# Patient Record
Sex: Male | Born: 1970 | Race: White | Hispanic: No | Marital: Married | Smoking: Never smoker
Health system: Southern US, Community
[De-identification: ages and names within clinical notes are randomized; demographics above are authoritative.]

## PROBLEM LIST (undated history)

## (undated) DIAGNOSIS — C719 Malignant neoplasm of brain, unspecified: Secondary | ICD-10-CM

## (undated) DIAGNOSIS — J984 Other disorders of lung: Secondary | ICD-10-CM

## (undated) DIAGNOSIS — IMO0002 Reserved for concepts with insufficient information to code with codable children: Secondary | ICD-10-CM

## (undated) DIAGNOSIS — K760 Fatty (change of) liver, not elsewhere classified: Secondary | ICD-10-CM

## (undated) DIAGNOSIS — E663 Overweight: Secondary | ICD-10-CM

## (undated) DIAGNOSIS — T7840XA Allergy, unspecified, initial encounter: Secondary | ICD-10-CM

## (undated) DIAGNOSIS — I499 Cardiac arrhythmia, unspecified: Secondary | ICD-10-CM

## (undated) DIAGNOSIS — B019 Varicella without complication: Secondary | ICD-10-CM

## (undated) DIAGNOSIS — K219 Gastro-esophageal reflux disease without esophagitis: Secondary | ICD-10-CM

## (undated) DIAGNOSIS — R413 Other amnesia: Secondary | ICD-10-CM

## (undated) DIAGNOSIS — R200 Anesthesia of skin: Secondary | ICD-10-CM

## (undated) DIAGNOSIS — Z0000 Encounter for general adult medical examination without abnormal findings: Secondary | ICD-10-CM

## (undated) DIAGNOSIS — G43909 Migraine, unspecified, not intractable, without status migrainosus: Secondary | ICD-10-CM

## (undated) DIAGNOSIS — Z87898 Personal history of other specified conditions: Secondary | ICD-10-CM

## (undated) DIAGNOSIS — IMO0001 Reserved for inherently not codable concepts without codable children: Secondary | ICD-10-CM

## (undated) DIAGNOSIS — C20 Malignant neoplasm of rectum: Principal | ICD-10-CM

## (undated) DIAGNOSIS — D649 Anemia, unspecified: Secondary | ICD-10-CM

## (undated) DIAGNOSIS — C801 Malignant (primary) neoplasm, unspecified: Secondary | ICD-10-CM

## (undated) DIAGNOSIS — R918 Other nonspecific abnormal finding of lung field: Principal | ICD-10-CM

## (undated) DIAGNOSIS — R945 Abnormal results of liver function studies: Secondary | ICD-10-CM

## (undated) DIAGNOSIS — R52 Pain, unspecified: Secondary | ICD-10-CM

## (undated) DIAGNOSIS — K469 Unspecified abdominal hernia without obstruction or gangrene: Secondary | ICD-10-CM

## (undated) DIAGNOSIS — R569 Unspecified convulsions: Secondary | ICD-10-CM

## (undated) DIAGNOSIS — L578 Other skin changes due to chronic exposure to nonionizing radiation: Secondary | ICD-10-CM

## (undated) DIAGNOSIS — C3490 Malignant neoplasm of unspecified part of unspecified bronchus or lung: Secondary | ICD-10-CM

## (undated) DIAGNOSIS — R911 Solitary pulmonary nodule: Secondary | ICD-10-CM

## (undated) DIAGNOSIS — Z8669 Personal history of other diseases of the nervous system and sense organs: Secondary | ICD-10-CM

## (undated) HISTORY — DX: Other nonspecific abnormal finding of lung field: R91.8

## (undated) HISTORY — DX: Migraine, unspecified, not intractable, without status migrainosus: G43.909

## (undated) HISTORY — DX: Reserved for inherently not codable concepts without codable children: IMO0001

## (undated) HISTORY — PX: TONSILLECTOMY: SUR1361

## (undated) HISTORY — DX: Abnormal results of liver function studies: R94.5

## (undated) HISTORY — DX: Other skin changes due to chronic exposure to nonionizing radiation: L57.8

## (undated) HISTORY — DX: Allergy, unspecified, initial encounter: T78.40XA

## (undated) HISTORY — DX: Gastro-esophageal reflux disease without esophagitis: K21.9

## (undated) HISTORY — DX: Varicella without complication: B01.9

## (undated) HISTORY — DX: Reserved for concepts with insufficient information to code with codable children: IMO0002

## (undated) HISTORY — DX: Other disorders of lung: J98.4

## (undated) HISTORY — DX: Anemia, unspecified: D64.9

## (undated) HISTORY — PX: HERNIA REPAIR: SHX51

## (undated) HISTORY — PX: CYST EXCISION: SHX5701

## (undated) HISTORY — DX: Overweight: E66.3

## (undated) HISTORY — DX: Encounter for general adult medical examination without abnormal findings: Z00.00

## (undated) HISTORY — DX: Unspecified abdominal hernia without obstruction or gangrene: K46.9

---

## 2012-01-19 ENCOUNTER — Encounter: Payer: Self-pay | Admitting: Family Medicine

## 2012-01-19 ENCOUNTER — Ambulatory Visit (INDEPENDENT_AMBULATORY_CARE_PROVIDER_SITE_OTHER): Payer: BC Managed Care – PPO | Admitting: Family Medicine

## 2012-01-19 VITALS — BP 138/90 | HR 63 | Temp 98.6°F | Ht 70.0 in | Wt 187.8 lb

## 2012-01-19 DIAGNOSIS — R140 Abdominal distension (gaseous): Secondary | ICD-10-CM

## 2012-01-19 DIAGNOSIS — E663 Overweight: Secondary | ICD-10-CM

## 2012-01-19 DIAGNOSIS — K219 Gastro-esophageal reflux disease without esophagitis: Secondary | ICD-10-CM

## 2012-01-19 DIAGNOSIS — R197 Diarrhea, unspecified: Secondary | ICD-10-CM | POA: Insufficient documentation

## 2012-01-19 DIAGNOSIS — K625 Hemorrhage of anus and rectum: Secondary | ICD-10-CM | POA: Insufficient documentation

## 2012-01-19 DIAGNOSIS — R7989 Other specified abnormal findings of blood chemistry: Secondary | ICD-10-CM

## 2012-01-19 DIAGNOSIS — Z23 Encounter for immunization: Secondary | ICD-10-CM

## 2012-01-19 DIAGNOSIS — Z0000 Encounter for general adult medical examination without abnormal findings: Secondary | ICD-10-CM | POA: Insufficient documentation

## 2012-01-19 DIAGNOSIS — L578 Other skin changes due to chronic exposure to nonionizing radiation: Secondary | ICD-10-CM

## 2012-01-19 DIAGNOSIS — R030 Elevated blood-pressure reading, without diagnosis of hypertension: Secondary | ICD-10-CM

## 2012-01-19 DIAGNOSIS — R142 Eructation: Secondary | ICD-10-CM

## 2012-01-19 DIAGNOSIS — IMO0001 Reserved for inherently not codable concepts without codable children: Secondary | ICD-10-CM

## 2012-01-19 DIAGNOSIS — T7840XA Allergy, unspecified, initial encounter: Secondary | ICD-10-CM

## 2012-01-19 HISTORY — DX: Other specified abnormal findings of blood chemistry: R79.89

## 2012-01-19 HISTORY — DX: Encounter for general adult medical examination without abnormal findings: Z00.00

## 2012-01-19 HISTORY — DX: Allergy, unspecified, initial encounter: T78.40XA

## 2012-01-19 HISTORY — DX: Overweight: E66.3

## 2012-01-19 HISTORY — DX: Other skin changes due to chronic exposure to nonionizing radiation: L57.8

## 2012-01-19 HISTORY — DX: Reserved for inherently not codable concepts without codable children: IMO0001

## 2012-01-19 LAB — CBC
Hemoglobin: 15.1 g/dL (ref 13.0–17.0)
MCHC: 33 g/dL (ref 30.0–36.0)
Platelets: 217 10*3/uL (ref 150.0–400.0)
RBC: 5.11 Mil/uL (ref 4.22–5.81)
RDW: 12.3 % (ref 11.5–14.6)

## 2012-01-19 LAB — RENAL FUNCTION PANEL
BUN: 10 mg/dL (ref 6–23)
CO2: 28 mEq/L (ref 19–32)
Chloride: 105 mEq/L (ref 96–112)
GFR: 96.34 mL/min (ref >60.00)
Phosphorus: 2.6 mg/dL (ref 2.3–4.6)
Potassium: 3.5 mEq/L (ref 3.5–5.1)
Sodium: 140 mEq/L (ref 135–145)

## 2012-01-19 LAB — TSH: TSH: 1.47 u[IU]/mL (ref 0.35–5.50)

## 2012-01-19 LAB — HEPATIC FUNCTION PANEL
AST: 61 U/L — ABNORMAL HIGH (ref 0–37)
Albumin: 4.2 g/dL (ref 3.5–5.2)
Alkaline Phosphatase: 56 U/L (ref 39–117)
Total Bilirubin: 0.6 mg/dL (ref 0.3–1.2)

## 2012-01-19 LAB — H. PYLORI ANTIBODY, IGG: H Pylori IgG: NEGATIVE

## 2012-01-19 MED ORDER — TETANUS-DIPHTH-ACELL PERTUSSIS 5-2.5-18.5 LF-MCG/0.5 IM SUSP: 0.5000 mL | Freq: Once | INTRAMUSCULAR | Status: DC

## 2012-01-19 NOTE — Assessment & Plan Note (Signed)
Experiencing anywhere from 2 to 8 loose stool daily at this time. Does travel to [place] intermittently secondary to his wife being from there. Will test him for Giardia, O & P, stool culture, stool for WBC, H Pylori, will have him start a probiotic and a fiber supplement a couple times a day and is referred to gastroenterology for further evaluation.

## 2012-01-19 NOTE — Assessment & Plan Note (Signed)
Patient was very pale skin and diffuse freckles throughout. Has a raised irregular lesion on his right arm he reports was biopsied in the past and he was told he had some precancer cells there but he has not pursued it. He also has a hypopigmented lesion on his left shoulder which is new and birth marks on bilateral feet along the dorsal surfaces which are not changed but are somewhat unusual. We will refer him to dermatology for diffuse skin exam and further evaluation

## 2012-01-19 NOTE — Assessment & Plan Note (Signed)
He agrees to flu and tdap, will give upon return. Labs from insurance company reviewed including cholesterol panel which was normal. Encouraged heart healthy diet, return in 1 month or sooner as needed

## 2012-01-19 NOTE — Progress Notes (Signed)
Patient ID: Jacob [name], male   DOB: [birth year], 41 y.o.   MRN: [id number] JARROD MCENERY [id number] [birth year] 01/19/2012      Progress Note New Patient  Subjective  Chief Complaint  Chief Complaint  Patient presents with  . Establish Care    new patient    HPI  Patient is a 41 year old Caucasian male who is in today with multiple concerns. 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 He felt a great deal of sludge frequently. Does struggle intermittently with a sense of postnasal drip and a mild cough but this is not his major complaint today. She's had trouble off and on since used with GI disturbances. He reports having episodes of constipation and diarrhea off and on throughout his whole childhood. Diarrhea predominant. Had frequent dyspepsia and he reports roughly 20-35 his symptoms quite a bit better with some dietary changes. Over the last 5-6 years his symptoms have progressively worsened. He notes she's been having episodes of rectal bleeding off and on for several years now. Initially it appeared to be just on the surface of the stool now he feels it is mixed in the stool and if that is happening infrequently is happening with each bowel movement. In the past he diet with some hemorrhoids and had irritation now not always the case. He reports on a good day she'll have 2 loose stool and on a bad day 8. He frequently feels bloated and gaseous the last year. He describes abdominal discomfort. Denies any black or sticky stool. Recently did lab work for a life insurance and was found to have elevated liver functions but a negative hepatitis C panel he'll he notes with all this at length he has some allergic conditions with nasal congestion but really he had more trouble with this younger man as well. He has diffuse freckles and skin changes but has a few lesions one on his right arm he says was previously biopsied and he was told was precancer. It is  regrowing at this time. He has a hypopigmented lesion on his left shoulder and 2 birth marks one on the top of each foot. He reports allergy to penicillin but does not recall the reaction. Says it was a rash he thinks. Believes she's taken amoxicillin since then without difficulty. No headache, fevers, chills, chest pain, palpitations or shortness of breath or noted.  Past Medical History  Diagnosis Date  . Chicken pox as a child    ?  . Hernia 6 months old  . Allergic state 01/19/2012  . Reflux 01/19/2012  . Elevated liver function tests 01/19/2012  . Overweight 01/19/2012  . Rectal bleeding 01/19/2012  . Diarrhea 01/19/2012  . Preventative health care 01/19/2012  . Elevated BP 01/19/2012  . Sun-damaged skin 01/19/2012    Past Surgical History  Procedure Date  . Hernia repair 6 months old    right inguinal repair    Family History  Problem Relation Age of Onset  . Adopted: Yes  . Cancer Father   . Heart disease Father     History   Social History  . Marital Status: Married    Spouse Name: N/A    Number of Children: N/A  . Years of Education: N/A   Occupational History  . Not on file.   Social History Main Topics  . Smoking status: Never Smoker   . Smokeless tobacco: Never Used  . Alcohol Use: Yes  occasionally  . Drug Use: Yes     marijuana  . Sexually Active: Yes -- Male partner(s)   Other Topics Concern  . Not on file   Social History Narrative  . No narrative on file    No current outpatient prescriptions on file prior to visit.   No current facility-administered medications on file prior to visit.    Allergies  Allergen Reactions  . Penicillins     Review of Systems  Review of Systems  Constitutional: Positive for malaise/fatigue. Negative for fever and chills.  HENT: Positive for congestion. Negative for hearing loss and nosebleeds.   Eyes: Negative for discharge.  Respiratory: Negative for cough, sputum production, shortness of  breath and wheezing.   Cardiovascular: Negative for chest pain, palpitations and leg swelling.  Gastrointestinal: Positive for heartburn, abdominal pain, diarrhea and blood in stool. Negative for nausea, vomiting, constipation and melena.  Genitourinary: Negative for dysuria, urgency, frequency and hematuria.  Musculoskeletal: Negative for myalgias, back pain and falls.  Skin: Negative for rash.  Neurological: Negative for dizziness, tremors, sensory change, focal weakness, loss of consciousness, weakness and headaches.  Endo/Heme/Allergies: Negative for polydipsia. Does not bruise/bleed easily.  Psychiatric/Behavioral: Negative for depression and suicidal ideas. The patient is not nervous/anxious and does not have insomnia.     Objective  BP 138/90  Pulse 63  Temp 98.6 F (37 C) (Temporal)  Ht 5\' 10"  (1.778 m)  Wt 187 lb 12.8 oz (85.186 kg)  BMI 26.95 kg/m2  SpO2 97%  Physical Exam  Physical Exam  Constitutional: He is oriented to person, place, and time and well-developed, well-nourished, and in no distress. No distress.  HENT:  Head: Normocephalic and atraumatic.       Small growth of cartilage just anterior to left ear  Eyes: Conjunctivae normal are normal.  Neck: Neck supple. No thyromegaly present.  Cardiovascular: Normal rate, regular rhythm and normal heart sounds.   No murmur heard. Pulmonary/Chest: Effort normal and breath sounds normal. No respiratory distress.  Abdominal: He exhibits no distension and no mass. There is no tenderness.  Musculoskeletal: He exhibits no edema.  Neurological: He is alert and oriented to person, place, and time.  Skin: Skin is warm.       Diffuse freckles, brown oval, slightly raised lesion on right forearm. Hypopigmented lesion on left shoulder 1 cm in diameter and a lesion on the top of both feet R>L circular, raised brown, 2 tone  Psychiatric: Memory, affect and judgment normal.       Assessment & Plan  Allergic state Has had  more trouble in past, will consider otc antihistamines in future if symptoms worsen  Diarrhea Experiencing anywhere from 2 to 8 loose stool daily at this time. Does travel to [place] intermittently secondary to his wife being from there. Will test him for Giardia, O & P, stool culture, stool for WBC, H Pylori, will have him start a probiotic and a fiber supplement a couple times a day and is referred to gastroenterology for further evaluation.   Reflux Was experiencing worse dyspepsia a few months ago, will not initiate a medication today but is encouraged to minimize spicy and fatty foods  Rectal bleeding Patient has been noting intermittent episodes of blood on the stool for a couple of years now. More recently over the last couple months it's become more consistent. He is having blood on his stool with every movement and is noticing blood mixed in the stool in the bowl now as well.  Further complaint of increased gaseousness and bloating for about a year now. Is referred to gastroenterology today for further consideration and is subjected to stool and blood testing today.   Elevated liver function tests Had lab work done in September of 2013 when he was applying to get life insurance, his AST was 51 and his ALT was 110. Hep C checked at that time was negative. Will perform a full hepatitis panel today, repeat liver function testing and check a ferritin level and cbc. Encouraged him to consider the possibility of fatty liver disease and he is asked to minimize simple carbs and hi fat foods. Check a complete abdominal ultrasound  Overweight Consider DASH diet. Given handout. Check TSH  Elevated BP Minimize sodium and check labs, recheck at next visit.  Sun-damaged skin Patient was very pale skin and diffuse freckles throughout. Has a raised irregular lesion on his right arm he reports was biopsied in the past and he was told he had some precancer cells there but he has not pursued it. He also has  a hypopigmented lesion on his left shoulder which is new and birth marks on bilateral feet along the dorsal surfaces which are not changed but are somewhat unusual. We will refer him to dermatology for diffuse skin exam and further evaluation  Preventative health care He agrees to flu and tdap, will give upon return. Labs from insurance company reviewed including cholesterol panel which was normal. Encouraged heart healthy diet, return in 1 month or sooner as needed

## 2012-01-19 NOTE — Assessment & Plan Note (Signed)
Minimize sodium and check labs, recheck at next visit.

## 2012-01-19 NOTE — Assessment & Plan Note (Signed)
Patient has been noting intermittent episodes of blood on the stool for a couple of years now. More recently over the last couple months it's become more consistent. He is having blood on his stool with every movement and is noticing blood mixed in the stool in the bowl now as well. Further complaint of increased gaseousness and bloating for about a year now. Is referred to gastroenterology today for further consideration and is subjected to stool and blood testing today.

## 2012-01-19 NOTE — Assessment & Plan Note (Signed)
Consider DASH diet. Given handout. Check TSH

## 2012-01-19 NOTE — Assessment & Plan Note (Signed)
Has had more trouble in past, will consider otc antihistamines in future if symptoms worsen

## 2012-01-19 NOTE — Patient Instructions (Signed)
Digestive Health probiotic by Schiff Start a fiber powder of your choice twice daily   Preventive Care for Adults, Male A healthy lifestyle and preventive care can promote health and wellness. Preventive health guidelines for men include the following key practices:  A routine yearly physical is a good way to check with your caregiver about your health and preventative screening. It is a chance to share any concerns and updates on your health, and to receive a thorough exam.  Visit your dentist for a routine exam and preventative care every 6 months. Brush your teeth twice a day and floss once a day. Good oral hygiene prevents tooth decay and gum disease.  The frequency of eye exams is based on your age, health, family medical history, use of contact lenses, and other factors. Follow your caregiver's recommendations for frequency of eye exams.  Eat a healthy diet. Foods like vegetables, fruits, whole grains, low-fat dairy products, and lean protein foods contain the nutrients you need without too many calories. Decrease your intake of foods high in solid fats, added sugars, and salt. Eat the right amount of calories for you.Get information about a proper diet from your caregiver, if necessary.  Regular physical exercise is one of the most important things you can do for your health. Most adults should get at least 150 minutes of moderate-intensity exercise (any activity that increases your heart rate and causes you to sweat) each week. In addition, most adults need muscle-strengthening exercises on 2 or more days a week.  Maintain a healthy weight. The body mass index (BMI) is a screening tool to identify possible weight problems. It provides an estimate of body fat based on height and weight. Your caregiver can help determine your BMI, and can help you achieve or maintain a healthy weight.For adults 20 years and older:  A BMI below 18.5 is considered underweight.  A BMI of 18.5 to 24.9 is  normal.  A BMI of 25 to 29.9 is considered overweight.  A BMI of 30 and above is considered obese.  Maintain normal blood lipids and cholesterol levels by exercising and minimizing your intake of saturated fat. Eat a balanced diet with plenty of fruit and vegetables. Blood tests for lipids and cholesterol should begin at age 74 and be repeated every 5 years. If your lipid or cholesterol levels are high, you are over 50, or you are a high risk for heart disease, you may need your cholesterol levels checked more frequently.Ongoing high lipid and cholesterol levels should be treated with medicines if diet and exercise are not effective.  If you smoke, find out from your caregiver how to quit. If you do not use tobacco, do not start.  If you choose to drink alcohol, do not exceed 2 drinks per day. One drink is considered to be 12 ounces (355 mL) of beer, 5 ounces (148 mL) of wine, or 1.5 ounces (44 mL) of liquor.  Avoid use of street drugs. Do not share needles with anyone. Ask for help if you need support or instructions about stopping the use of drugs.  High blood pressure causes heart disease and increases the risk of stroke. Your blood pressure should be checked at least every 1 to 2 years. Ongoing high blood pressure should be treated with medicines, if weight loss and exercise are not effective.  If you are 92 to 41 years old, ask your caregiver if you should take aspirin to prevent heart disease.  Diabetes screening involves taking a blood  sample to check your fasting blood sugar level. This should be done once every 3 years, after age 41, if you are within normal weight and without risk factors for diabetes. Testing should be considered at a younger age or be carried out more frequently if you are overweight and have at least 1 risk factor for diabetes.  Colorectal cancer can be detected and often prevented. Most routine colorectal cancer screening begins at the age of 95 and continues  through age 31. However, your caregiver may recommend screening at an earlier age if you have risk factors for colon cancer. On a yearly basis, your caregiver may provide home test kits to check for hidden blood in the stool. Use of a small camera at the end of a tube, to directly examine the colon (sigmoidoscopy or colonoscopy), can detect the earliest forms of colorectal cancer. Talk to your caregiver about this at age 72, when routine screening begins. Direct examination of the colon should be repeated every 5 to 10 years through age 42, unless early forms of pre-cancerous polyps or small growths are found.  Hepatitis C blood testing is recommended for all people born from [birth year] through 1965 and any individual with known risks for hepatitis C.  Practice safe sex. Use condoms and avoid high-risk sexual practices to reduce the spread of sexually transmitted infections (STIs). STIs include gonorrhea, chlamydia, syphilis, trichomonas, herpes, HPV, and human immunodeficiency virus (HIV). Herpes, HIV, and HPV are viral illnesses that have no cure. They can result in disability, cancer, and death.  A one-time screening for abdominal aortic aneurysm (AAA) and surgical repair of large AAAs by sound wave imaging (ultrasonography) is recommended for ages 24 to 36 years who are current or former smokers.  Healthy men should no longer receive prostate-specific antigen (PSA) blood tests as part of routine cancer screening. Consult with your caregiver about prostate cancer screening.  Testicular cancer screening is not recommended for adult males who have no symptoms. Screening includes self-exam, caregiver exam, and other screening tests. Consult with your caregiver about any symptoms you have or any concerns you have about testicular cancer.  Use sunscreen with skin protection factor (SPF) of 30 or more. Apply sunscreen liberally and repeatedly throughout the day. You should seek shade when your shadow is shorter  than you. Protect yourself by wearing long sleeves, pants, a wide-brimmed hat, and sunglasses year round, whenever you are outdoors.  Once a month, do a whole body skin exam, using a mirror to look at the skin on your back. Notify your caregiver of new moles, moles that have irregular borders, moles that are larger than a pencil eraser, or moles that have changed in shape or color.  Stay current with required immunizations.  Influenza. You need a dose every fall (or winter). The composition of the flu vaccine changes each year, so being vaccinated once is not enough.  Pneumococcal polysaccharide. You need 1 to 2 doses if you smoke cigarettes or if you have certain chronic medical conditions. You need 1 dose at age 35 (or older) if you have never been vaccinated.  Tetanus, diphtheria, pertussis (Tdap, Td). Get 1 dose of Tdap vaccine if you are younger than age 47 years, are over 81 and have contact with an infant, are a Research scientist (physical sciences), or simply want to be protected from whooping cough. After that, you need a Td booster dose every 10 years. Consult your caregiver if you have not had at least 3 tetanus and diphtheria-containing shots sometime  in your life or have a deep or dirty wound.  HPV. This vaccine is recommended for males 13 through 41 years of age. This vaccine may be given to men 22 through 41 years of age who have not completed the 3 dose series. It is recommended for men through age 17 who have sex with men or whose immune system is weakened because of HIV infection, other illness, or medications. The vaccine is given in 3 doses over 6 months.  Measles, mumps, rubella (MMR). You need at least 1 dose of MMR if you were born in 1957 or later. You may also need a 2nd dose.  Meningococcal. If you are age 62 to 41 years and a Orthoptist living in a residence hall, or have one of several medical conditions, you need to get vaccinated against meningococcal disease. You may also  need additional booster doses.  Zoster (shingles). If you are age 63 years or older, you should get this vaccine.  Varicella (chickenpox). If you have never had chickenpox or you were vaccinated but received only 1 dose, talk to your caregiver to find out if you need this vaccine.  Hepatitis A. You need this vaccine if you have a specific risk factor for hepatitis A virus infection, or you simply wish to be protected from this disease. The vaccine is usually given as 2 doses, 6 to 18 months apart.  Hepatitis B. You need this vaccine if you have a specific risk factor for hepatitis B virus infection or you simply wish to be protected from this disease. The vaccine is given in 3 doses, usually over 6 months. Preventative Service / Frequency Ages 49 to 75  Blood pressure check.** / Every 1 to 2 years.  Lipid and cholesterol check.** / Every 5 years beginning at age 6.  Hepatitis C blood test.** / For any individual with known risks for hepatitis C.  Skin self-exam. / Monthly.  Influenza immunization.** / Every year.  Pneumococcal polysaccharide immunization.** / 1 to 2 doses if you smoke cigarettes or if you have certain chronic medical conditions.  Tetanus, diphtheria, pertussis (Tdap,Td) immunization. / A one-time dose of Tdap vaccine. After that, you need a Td booster dose every 10 years.  HPV immunization. / 3 doses over 6 months, if 26 and younger.  Measles, mumps, rubella (MMR) immunization. / You need at least 1 dose of MMR if you were born in 1957 or later. You may also need a 2nd dose.  Meningococcal immunization. / 1 dose if you are age 24 to 69 years and a Orthoptist living in a residence hall, or have one of several medical conditions, you need to get vaccinated against meningococcal disease. You may also need additional booster doses.  Varicella immunization.** / Consult your caregiver.  Hepatitis A immunization.** / Consult your caregiver. 2 doses, 6 to  18 months apart.  Hepatitis B immunization.** / Consult your caregiver. 3 doses usually over 6 months. Ages 74 to 76  Blood pressure check.** / Every 1 to 2 years.  Lipid and cholesterol check.** / Every 5 years beginning at age 80.  Fecal occult blood test (FOBT) of stool. / Every year beginning at age 22 and continuing until age 55. You may not have to do this test if you get colonoscopy every 10 years.  Flexible sigmoidoscopy** or colonoscopy.** / Every 5 years for a flexible sigmoidoscopy or every 10 years for a colonoscopy beginning at age 56 and continuing until age 44.  Hepatitis C blood test.** / For all people born from 38 through 1965 and any individual with known risks for hepatitis C.  Skin self-exam. / Monthly.  Influenza immunization.** / Every year.  Pneumococcal polysaccharide immunization.** / 1 to 2 doses if you smoke cigarettes or if you have certain chronic medical conditions.  Tetanus, diphtheria, pertussis (Tdap/Td) immunization.** / A one-time dose of Tdap vaccine. After that, you need a Td booster dose every 10 years.  Measles, mumps, rubella (MMR) immunization. / You need at least 1 dose of MMR if you were born in 1957 or later. You may also need a 2nd dose.  Varicella immunization.**/ Consult your caregiver.  Meningococcal immunization.** / Consult your caregiver.  Hepatitis A immunization.** / Consult your caregiver. 2 doses, 6 to 18 months apart.  Hepatitis B immunization.** / Consult your caregiver. 3 doses, usually over 6 months. Ages 38 and over  Blood pressure check.** / Every 1 to 2 years.  Lipid and cholesterol check.**/ Every 5 years beginning at age 2.  Fecal occult blood test (FOBT) of stool. / Every year beginning at age 33 and continuing until age 75. You may not have to do this test if you get colonoscopy every 10 years.  Flexible sigmoidoscopy** or colonoscopy.** / Every 5 years for a flexible sigmoidoscopy or every 10 years for a  colonoscopy beginning at age 14 and continuing until age 32.  Hepatitis C blood test.** / For all people born from [birth year] through 1965 and any individual with known risks for hepatitis C.  Abdominal aortic aneurysm (AAA) screening.** / A one-time screening for ages 39 to 15 years who are current or former smokers.  Skin self-exam. / Monthly.  Influenza immunization.** / Every year.  Pneumococcal polysaccharide immunization.** / 1 dose at age 58 (or older) if you have never been vaccinated.  Tetanus, diphtheria, pertussis (Tdap, Td) immunization. / A one-time dose of Tdap vaccine if you are over 65 and have contact with an infant, are a Research scientist (physical sciences), or simply want to be protected from whooping cough. After that, you need a Td booster dose every 10 years.  Varicella immunization. ** / Consult your caregiver.  Meningococcal immunization.** / Consult your caregiver.  Hepatitis A immunization. ** / Consult your caregiver. 2 doses, 6 to 18 months apart.  Hepatitis B immunization.** / Check with your caregiver. 3 doses, usually over 6 months. **Family history and personal history of risk and conditions may change your caregiver's recommendations. Document Released: 05/04/2001 Document Revised: 05/31/2011 Document Reviewed: 08/03/2010 [facility] Patient Information 2013 [place], [place].

## 2012-01-19 NOTE — Assessment & Plan Note (Signed)
Was experiencing worse dyspepsia a few months ago, will not initiate a medication today but is encouraged to minimize spicy and fatty foods

## 2012-01-19 NOTE — Assessment & Plan Note (Signed)
Had lab work done in September of 2013 when he was applying to get life insurance, his AST was 51 and his ALT was 110. Hep C checked at that time was negative. Will perform a full hepatitis panel today, repeat liver function testing and check a ferritin level and cbc. Encouraged him to consider the possibility of fatty liver disease and he is asked to minimize simple carbs and hi fat foods. Check a complete abdominal ultrasound

## 2012-01-20 ENCOUNTER — Encounter: Payer: Self-pay | Admitting: Gastroenterology

## 2012-01-20 ENCOUNTER — Ambulatory Visit (INDEPENDENT_AMBULATORY_CARE_PROVIDER_SITE_OTHER): Payer: BC Managed Care – PPO

## 2012-01-20 DIAGNOSIS — Z23 Encounter for immunization: Secondary | ICD-10-CM

## 2012-01-20 LAB — HEPATITIS PANEL, ACUTE
HCV Ab: NEGATIVE
Hep A IgM: NEGATIVE
Hep B C IgM: NEGATIVE

## 2012-01-20 MED ORDER — TETANUS-DIPHTH-ACELL PERTUSSIS 5-2.5-18.5 LF-MCG/0.5 IM SUSP: 0.5000 mL | Freq: Once | INTRAMUSCULAR | Status: DC

## 2012-01-20 NOTE — Addendum Note (Signed)
Addended by: Baldemar Lenis R on: 01/20/2012 10:58 AM   Modules accepted: Orders

## 2012-01-21 ENCOUNTER — Ambulatory Visit (HOSPITAL_BASED_OUTPATIENT_CLINIC_OR_DEPARTMENT_OTHER)
Admission: RE | Admit: 2012-01-21 | Discharge: 2012-01-21 | Disposition: A | Discharge status: Home / Self Care | Payer: BC Managed Care – PPO | Source: Ambulatory Visit | Attending: Family Medicine | Admitting: Family Medicine

## 2012-01-21 DIAGNOSIS — K921 Melena: Secondary | ICD-10-CM | POA: Insufficient documentation

## 2012-01-21 DIAGNOSIS — R141 Gas pain: Secondary | ICD-10-CM | POA: Insufficient documentation

## 2012-01-21 DIAGNOSIS — R7989 Other specified abnormal findings of blood chemistry: Secondary | ICD-10-CM | POA: Insufficient documentation

## 2012-01-21 DIAGNOSIS — R142 Eructation: Secondary | ICD-10-CM | POA: Insufficient documentation

## 2012-01-21 LAB — FECAL LACTOFERRIN, QUANT: Lactoferrin: POSITIVE

## 2012-01-21 LAB — OVA AND PARASITE EXAMINATION

## 2012-01-21 NOTE — Progress Notes (Signed)
Quick Note:  Patient Informed and voiced understanding ______ 

## 2012-01-21 NOTE — Progress Notes (Signed)
Quick Note:  Patient Informed and voiced understanding.  I mailed a copy to patient. Pt will call back if he wants to proceed with referrals. ______

## 2012-01-24 LAB — STOOL CULTURE

## 2012-01-25 ENCOUNTER — Encounter: Payer: Self-pay | Admitting: Gastroenterology

## 2012-01-25 ENCOUNTER — Ambulatory Visit (INDEPENDENT_AMBULATORY_CARE_PROVIDER_SITE_OTHER): Payer: BC Managed Care – PPO | Admitting: Gastroenterology

## 2012-01-25 ENCOUNTER — Other Ambulatory Visit (INDEPENDENT_AMBULATORY_CARE_PROVIDER_SITE_OTHER): Payer: BC Managed Care – PPO

## 2012-01-25 VITALS — BP 128/72 | HR 68 | Wt 191.4 lb

## 2012-01-25 DIAGNOSIS — K921 Melena: Secondary | ICD-10-CM

## 2012-01-25 DIAGNOSIS — R7989 Other specified abnormal findings of blood chemistry: Secondary | ICD-10-CM

## 2012-01-25 DIAGNOSIS — R197 Diarrhea, unspecified: Secondary | ICD-10-CM

## 2012-01-25 LAB — COMPREHENSIVE METABOLIC PANEL
ALT: 114 U/L — ABNORMAL HIGH (ref 0–53)
AST: 52 U/L — ABNORMAL HIGH (ref 0–37)
Alkaline Phosphatase: 53 U/L (ref 39–117)
BUN: 19 mg/dL (ref 6–23)
Creatinine, Ser: 0.9 mg/dL (ref 0.4–1.5)
Total Bilirubin: 0.4 mg/dL (ref 0.3–1.2)

## 2012-01-25 LAB — CBC WITH DIFFERENTIAL/PLATELET
Basophils Absolute: 0 10*3/uL (ref 0.0–0.1)
Basophils Relative: 0.3 % (ref 0.0–3.0)
Eosinophils Absolute: 0.1 10*3/uL (ref 0.0–0.7)
Hemoglobin: 15 g/dL (ref 13.0–17.0)
Lymphocytes Relative: 24.9 % (ref 12.0–46.0)
Lymphs Abs: 1.3 10*3/uL (ref 0.7–4.0)
MCHC: 33.2 g/dL (ref 30.0–36.0)
MCV: 89.8 fl (ref 78.0–100.0)
Monocytes Absolute: 0.6 10*3/uL (ref 0.1–1.0)
Neutro Abs: 3.1 10*3/uL (ref 1.4–7.7)
RDW: 12.6 % (ref 11.5–14.6)

## 2012-01-25 LAB — IBC PANEL: Transferrin: 191 mg/dL — ABNORMAL LOW (ref 212.0–360.0)

## 2012-01-25 LAB — ALPHA-1-ANTITRYPSIN: A-1 Antitrypsin, Ser: 136 mg/dL (ref 90–200)

## 2012-01-25 LAB — CERULOPLASMIN: Ceruloplasmin: 24 mg/dL (ref 20–60)

## 2012-01-25 LAB — PROTIME-INR: INR: 1 ratio (ref 0.8–1.0)

## 2012-01-25 MED ORDER — PEG-KCL-NACL-NASULF-NA ASC-C 100 G PO SOLR: 1.0000 | Freq: Once | ORAL | Status: DC

## 2012-01-25 NOTE — Progress Notes (Signed)
HPI: This is a    very pleasant 41 year old man whom I am meeting for the first time today.  Alt 124, ast 61.  Other lfts all normal.  Fecal lactoferrin was positive however all other testing including Clostridium difficile, ova parasites, routine culture were normal. CBC was normal. Complete metabolic profile was normal except for the liver tests mentioned previously.  [place] recent showed gb sludge, fatty liver othwerwise normal  Alternating bowels lately.  Feels like a "blockage,"  Feels very bloated at times. Will usually 6-10 times a day at times.  Can be only small tablespoon of stool. Has noticed blood in his stool. This occurred 2-3 years ago as well.  Blamed hemorrhoids at that point.  Lately, blood within the stool for several months.  Has gained 15 pounds at least in the past year.  But feels like he has been eating less.    Liver tests elevated recently, has never been told this previously.    Adopted.    Pretty rare alcohol.  Professor at [employer], Psychologist, educational, scultping.         Review of systems: Pertinent positive and negative review of systems were noted in the above HPI section. Complete review of systems was performed and was otherwise normal.    Past Medical History  Diagnosis Date  . Chicken pox as a child    ?  . Hernia 6 months old  . Allergic state 01/19/2012  . Reflux 01/19/2012  . Elevated liver function tests 01/19/2012  . Overweight 01/19/2012  . Rectal bleeding 01/19/2012  . Diarrhea 01/19/2012  . Preventative health care 01/19/2012  . Elevated BP 01/19/2012  . Sun-damaged skin 01/19/2012  . Migraine headache as a child    Past Surgical History  Procedure Date  . Hernia repair 6 months old    right inguinal repair    No current outpatient prescriptions on file.    Allergies as of 01/25/2012 - Review Complete 01/25/2012  Allergen Reaction Noted  . Penicillins  01/19/2012    Family History  Problem Relation Age of Onset  . Adopted: Yes  .  Cancer Father   . Heart disease Father     History   Social History  . Marital Status: Married    Spouse Name: N/A    Number of Children: 1  . Years of Education: N/A   Occupational History  . Scultor/Professor    Social History Main Topics  . Smoking status: Never Smoker   . Smokeless tobacco: Never Used  . Alcohol Use: Yes     Comment: occasionally  . Drug Use: Yes    Special: Marijuana     Comment: marijuana  . Sexually Active: Yes -- Male partner(s)   Other Topics Concern  . Not on file   Social History Narrative  . No narrative on file       Physical Exam: BP 128/72  Pulse 68  Wt 191 lb 6.4 oz (86.818 kg) Constitutional: generally well-appearing Psychiatric: alert and oriented x3 Eyes: extraocular movements intact Mouth: oral pharynx moist, no lesions Neck: supple no lymphadenopathy Cardiovascular: heart regular rate and rhythm Lungs: clear to auscultation bilaterally Abdomen: soft, nontender, nondistended, no obvious ascites, no peritoneal signs, normal bowel sounds Extremities: no lower extremity edema bilaterally Skin: no lesions on visible extremities    Assessment and plan: 41 y.o. male with  elevated liver tests, diarrhea, intermittent rectal bleeding.  He is art he had viral testing for his elevated liver tests. I suspect these  are related to fatty liver disease, he has gained 15-20 pounds in the past year. He also has some loose stools and blood in his stool, possibly colitis, inflammatory bowel disease. This can also cause LFT elevation, systemic irritation. I'm going to send his blood for further testing to try to explain his liver tests elevation, see those tests summarized below. He also get colonoscopy for his diarrhea and rectal bleeding.

## 2012-01-25 NOTE — Patient Instructions (Addendum)
You will be set up for a colonoscopy for diarrhea, blood in stool. You will get labs drawn today:  total iron, ferritin, TIBC, ANA, AMA, anti smooth muscle antibody, alpha 1 antitrypsin, cerulloplasm, TTG, total IgA level, CBC, CMET, INR. This is for elevated liver tests. A copy of this information will be made available to Dr. Abner Greenspan.

## 2012-01-26 LAB — MITOCHONDRIAL ANTIBODIES: Mitochondrial M2 Ab, IgG: 0.34 (ref <0.91)

## 2012-01-26 LAB — TISSUE TRANSGLUTAMINASE, IGA: Tissue Transglutaminase Ab, IgA: 4 U/mL (ref <20)

## 2012-01-26 LAB — ANTI-SMOOTH MUSCLE ANTIBODY, IGG: Smooth Muscle Ab: 3 U (ref <20)

## 2012-02-01 ENCOUNTER — Other Ambulatory Visit: Payer: Self-pay

## 2012-02-08 ENCOUNTER — Telehealth: Payer: Self-pay | Admitting: Gastroenterology

## 2012-02-08 NOTE — Telephone Encounter (Signed)
Pt has been notified to have labs the Friday before the procedure

## 2012-02-14 ENCOUNTER — Encounter: Payer: Self-pay | Admitting: Family Medicine

## 2012-02-14 ENCOUNTER — Ambulatory Visit (INDEPENDENT_AMBULATORY_CARE_PROVIDER_SITE_OTHER): Payer: BC Managed Care – PPO | Admitting: Family Medicine

## 2012-02-14 VITALS — BP 122/81 | HR 78 | Temp 98.4°F | Ht 70.0 in | Wt 187.4 lb

## 2012-02-14 DIAGNOSIS — R197 Diarrhea, unspecified: Secondary | ICD-10-CM

## 2012-02-14 DIAGNOSIS — R7989 Other specified abnormal findings of blood chemistry: Secondary | ICD-10-CM

## 2012-02-14 DIAGNOSIS — K219 Gastro-esophageal reflux disease without esophagitis: Secondary | ICD-10-CM

## 2012-02-14 DIAGNOSIS — R030 Elevated blood-pressure reading, without diagnosis of hypertension: Secondary | ICD-10-CM

## 2012-02-14 NOTE — Assessment & Plan Note (Signed)
Only one episode since last visit. May use Tums or Maalox prn for infrequent episodes

## 2012-02-14 NOTE — Assessment & Plan Note (Signed)
Numbers good today, no changes, minimize sodium

## 2012-02-14 NOTE — Assessment & Plan Note (Signed)
Has dropped from 8-10 episodes daily to 5-6 with the addition of fiber and probiotics, agrees to proceed with colonoscopy and to consider avoiding gluten for 3 months to see if his symptoms improve. Call if symptoms worsen

## 2012-02-14 NOTE — Assessment & Plan Note (Signed)
Repeat LFTs today. Hepatitis panel negative. Minimize saturated fats and simple carbs

## 2012-02-14 NOTE — Patient Instructions (Addendum)
Gluten-Free Diet  Gluten is a protein found in many grains. Gluten is present in wheat, rye, and barley. Gluten from wheat, rye, and barley protein interferes with the absorption of food in people with gluten sensitivity. It may also cause intestinal injury when eaten by individuals with gluten sensitivity.   A sample piece (biopsy) of the small intestine is usually required for a positive diagnosis of gluten sensitivity. Dietary treatment consists of eliminating foods and food ingredients from wheat, rye, and barley. When these are taken out of the diet completely, most people regain function of the small intestine.  Strict compliance is important even during symptom-free periods. People with gluten sensitivity need to be on a gluten-free diet for a lifetime. During the first stages of treatment, some people will also need to restrict dairy products that contain lactose, which is a naturally occurring sugar. Lactose is difficult to absorb when the small intestines are damaged (lactose intolerance).   WHO NEEDS THIS DIET  Some people who have certain diseases need to be on a gluten-free diet. These diseases include:  · Celiac disease.  · Nontropical sprue.  · Gluten-sensitive enteropathy.  · Dermatitis herpetiformis.  SPECIAL NOTES  · Read all labels because gluten may have been added as an incidental ingredient. Words to check for on the label include: flour, starch, durum flour, graham flour, phosphated flour, self-rising flour, semolina, farina, modified food starch, cereal, thickening, fillers, emulsifiers, any kind of malt flavoring, and hydrolyzed vegetable protein. A registered dietician can help you identify possible harmful ingredients in the foods you normally eat.  · If you are not sure whether an ingredient contains gluten, check with the manufacturer. Note that some manufacturers may change ingredients without notice. Always read labels.   · Since flour and cereal products are often used in the  preparation of foods, it is important to be aware of the methods of preparation used, as well as the foods themselves. This is especially true when you are dining out.  Starches  · Allowed: Only those prepared from arrowroot, corn, potato, rice, and bean flours. Rice wafers(*), pure cornmeal tortillas, popcorn, some crackers, and chips(*). Hot cereals made from cornmeal. Ask your dietician which specific hot and cold cereals are allowed. White or sweet potatoes, yams, hominy, rice or wild rice, and special gluten-free pasta. Some oriental rice noodles or bean noodles.  · Avoid: All wheat and rye cereals, wheat germ, barley, bran, graham, malt, bulgur, and millet(-). NOTE: Avoid cereals containing malt as a flavoring, such as rice cereal. Regular noodles, spaghetti, macaroni, and most packaged rice mixes(*). All others containing wheat, rye, or barley.  Vegetables  · Allowed: All plain, fresh, frozen, or canned vegetables.  · Avoid: Creamed vegetables(*) and vegetables canned in sauces(*). Any prepared with wheat, rye, or barley.  Fruit  · Allowed: All fresh, frozen, canned, or dried fruits. Fruit juices.  · Avoid: Thickened or prepared fruits and some pie fillings(*).  Meat and Meat Substitutes  · Allowed: Meat, fish, poultry, or eggs prepared without added wheat, rye, or barley. Luncheon meat(*), frankfurters(*), and pure meat. All aged cheese and processed cheese products(*). Cottage cheese(+) and cream cheese(+). Dried beans, dried peas, and lentils.  · Avoid: Any meat or meat alternate containing wheat, rye, barley, or gluten stabilizers. Bread-containing products, such as Swiss steak, croquettes, and meatloaf. Tuna canned in vegetable broth(*); turkey with HVP injected as part of the basting; any cheese product containing oat gum as an ingredient.  Milk  · Allowed: Milk.   Yogurt made with allowed ingredients(*).  · Avoid: Commercial chocolate milk which may have cereal added(*). Malted milk.  Soups and  Combination Foods  · Allowed: Homemade broth and soups made with allowed ingredients; some canned or frozen soups are allowed(*). Combination or prepared foods that do not contain gluten(*). Read labels.  · Avoid: All soups containing wheat, rye, or barley flour. Bouillon and bouillon cubes that contain hydrolyzed vegetable protein (HVP). Combination or prepared foods that contain gluten(*).  Desserts  · Allowed:  Custard, some pudding mixes(*), homemade puddings from cornstarch, rice, and tapioca. Gelatin desserts, ices, and sherbet(*). Cake, cookies, and other desserts prepared with allowed flours. Some commercial ice creams(*). Ask your dietician about specific brands of dessert that are allowed.  · Avoid: Cakes, cookies, doughnuts, and pastries that are prepared with wheat, rye, or barley flour. Some commercial ice creams(*), ice cream flavors which contain cookies, crumbs, or cheesecake(*). Ice cream cones. All commercially prepared mixes for cakes, cookies, and other desserts(*). Bread pudding and other puddings thickened with flour.  Sweets  · Allowed: Sugar, honey, syrup(*), molasses, jelly, jam, plain hard candy, marshmallows, gumdrops, homemade candies free from wheat, rye, or barley. Coconut.  · Avoid: Commercial candies containing wheat, rye, or barley(*). Certain buttercrunch toffees are dusted with wheat flour. Ask your dietician about specific brands that are not allowed. Chocolate-coated nuts, which are often rolled in flour.  Fats and Oils  · Allowed: Butter, margarine, vegetable oil, sour cream(+), whipping cream, shortening, lard, cream, mayonnaise(*). Some commercial salad dressings(*). Peanut butter.  · Avoid: Some commercial salad dressings(*).  Beverages  · Allowed: Coffee (regular or decaffeinated), tea, herbal tea (read label to be sure that no wheat flour has been added). Carbonated beverages and some root beers(*). Wine, sake, and distilled spirits, such as gin, vodka, and  whiskey.  · Avoid:  Certain cereal beverages. Ask your dietician about specific brands that are not allowed. Beer (unless gluten-free), ale, malted milk, and some root beers, wine, and sake.  Condiments/ Miscellaneous  · Allowed: Salt, pepper, herbs, spices, extracts, and food colorings. Monosodium glutamate (MSG). Cider, rice, and wine vinegar. Baking soda and baking powder. Certain soy sauces. Ask your dietician about specific brands that are allowed. Nuts, coconut, chocolate, and pure cocoa powder.  · Avoid: Some curry powder(*), some dry seasoning mixes(*), some gravy extracts(*), some meat sauces(*), some catsup(*), some prepared mustard(*), horseradish(*), some soy sauce(*), chip dips(*), and some chewing gum(*). Yeast extract (contains barley). Caramel color (may contain malt). Ask your dietician about specific brands of condiments to avoid.  Flour and Thickening Agents  · Allowed: Arrowroot starch (A); Corn bran (B); Corn flour (B,C,D); Corn germ (B); Cornmeal (B,C,D); Corn starch (A); Potato flour (B,C,E); Potato starch flour (B,C,E); Rice bran (B); Rice flours: Plain, brown (B,C,D,E), and Sweet (A,B,C,F). Rice polish (B,C,G); Soy flour (B,C,G); Tapioca starch (A).  The flour and thickening agents described above are good for:  (A) Good thickening agent  (B) Good when combined with other flours  (C) Best combined with milk and eggs in baked products  (D) Best in grainy-textured products  (E) Produces drier product than other flours  (F) Produces moister product than other flours  (G) Adds distinct flavor to product. Use in moderation.  (*) Check labels and investigate any questionable ingredients.   (-) Additional research is needed before this product can be recommended.  (+) Check vegetable gum used.  SAMPLE MEAL PLAN  Breakfast   · Orange juice.  · Banana.  ·   Gluten-free bread.  Custard.  Heart-healthy margarine.  Coffee or tea. These meal plans are provided as samples. Your daily meal plans will vary. Document Released: 03/08/2005 Document Revised: 09/07/2011 Document Reviewed: 04/18/2011 [facility] Patient Information 2013 [place], [place].   Fatty Liver Fatty liver is the accumulation of fat in liver cells. It is also called hepatosteatosis or steatohepatitis. It is normal for your liver to contain some fat. If fat is more than 5 to 10% of your liver's weight, you have fatty liver.  There are often no symptoms (problems) for years while damage is still occurring. People often learn about their fatty liver when they have medical tests for other reasons. Fat can damage your liver for years or even decades without causing problems. When it becomes severe, it can cause fatigue, weight loss, weakness, and confusion. This makes you more likely to develop more serious liver problems. The liver is the largest organ in the body. It does a lot of work and often gives no warning signs when it is sick until late in a disease. The liver has many important jobs including:  Breaking down foods.  Storing vitamins, iron, and other minerals.  Making proteins.  Making bile for food digestion.  Breaking down many products including medications, alcohol and some poisons. CAUSES  There are a number of different conditions, medications, and poisons that can cause a fatty liver. Eating too many calories causes fat to build up in the liver. Not processing and breaking fats down normally may also cause this. Certain conditions, such as obesity, diabetes, and high triglycerides also cause this. Most fatty liver patients tend to be  middle-aged and over weight.  Some causes of fatty liver are:  Alcohol over consumption.  Malnutrition.  Steroid use.  Valproic acid toxicity.  Obesity.  Cushing's syndrome.  Poisons.  Tetracycline in high dosages.  Pregnancy.  Diabetes.  Hyperlipidemia.  Rapid weight loss. Some people develop fatty liver even having none of these conditions. SYMPTOMS  Fatty liver most often causes no problems. This is called asymptomatic.  It can be diagnosed with blood tests and also by a liver biopsy.  It is one of the most common causes of minor elevations of liver enzymes on routine blood tests.  Specialized Imaging of the liver using ultrasound, CT (computed tomography) scan, or MRI (magnetic resonance imaging) can suggest a fatty liver but a biopsy is needed to confirm it.  A biopsy involves taking a small sample of liver tissue. This is done by using a needle. It is then looked at under a microscope by a specialist. TREATMENT  It is important to treat the cause. Simple fatty liver without a medical reason may not need treatment.  Weight loss, fat restriction, and exercise in overweight patients produces inconsistent results but is worth trying.  Fatty liver due to alcohol toxicity may not improve even with stopping drinking.  Good control of diabetes may reduce fatty liver.  Lower your triglycerides through diet, medication or both.  Eat a balanced, healthy diet.  Increase your physical activity.  Get regular checkups from a liver specialist.  There are no medical or surgical treatments for a fatty liver or NASH, but improving your diet and increasing your exercise may help prevent or reverse some of the damage. PROGNOSIS  Fatty liver may cause no damage or it can lead to an inflammation of the liver. This is, called steatohepatitis. When it is linked to alcohol abuse, it is called alcoholic steatohepatitis. It often is not  linked to alcohol. It is then called  nonalcoholic steatohepatitis, or NASH. Over time the liver may become scarred and hardened. This condition is called cirrhosis. Cirrhosis is serious and may lead to liver failure or cancer. NASH is one of the leading causes of cirrhosis. About 10-20% of Americans have fatty liver and a smaller 2-5% has NASH. Document Released: 04/23/2005 Document Revised: 05/31/2011 Document Reviewed: 06/16/2005 [facility] Patient Information 2013 [place], [place].

## 2012-02-14 NOTE — Progress Notes (Signed)
Patient ID: Jacob [name], male   DOB: [birth year], 41 y.o.   MRN: [id number] Jacob [name] [id number] 04-Dec-1970 02/14/2012      Progress Note-Follow Up  Subjective  Chief Complaint  Chief Complaint  Patient presents with  . Follow-up    1 month    HPI  This is a 41 year old Caucasian male who is in today for followup on his new patient appointment. He continues to have frequent loose stool but he says with the addition of fiber and probiotic state dropped from roughly 8-10 a day to 5 or 6 a day. Not seen any significant blood and has not seen any black or tarry stool. Does have a colonoscopy scheduled in the near future. Had his initial consultation with gastroenterology. Is not following any significant dietary changes at this time. He does note an ostomy avoiding dairy he thought he got somewhat better now he does eat small amount of cheese and coffee. Has never tried gluten free diet. Has not had any fevers or chills. No significant abdominal or back pain. Heartburn has not been a significant issue he's had one episode of reflux since he was last seen. No other acute complaints. No headache, chest pain, palpitations, shortness of breath, GU complaints noted today.  Past Medical History  Diagnosis Date  . Chicken pox as a child    ?  . Hernia 6 months old  . Allergic state 01/19/2012  . Reflux 01/19/2012  . Elevated liver function tests 01/19/2012  . Overweight 01/19/2012  . Rectal bleeding 01/19/2012  . Diarrhea 01/19/2012  . Preventative health care 01/19/2012  . Elevated BP 01/19/2012  . Sun-damaged skin 01/19/2012  . Migraine headache as a child    Past Surgical History  Procedure Date  . Hernia repair 6 months old    right inguinal repair    Family History  Problem Relation Age of Onset  . Adopted: Yes  . Cancer Father   . Heart disease Father     History   Social History  . Marital Status: Married    Spouse Name: N/A    Number of Children: 1  . 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 Smoking status: Never Smoker   . Smokeless tobacco: Never Used  . Alcohol Use: Yes     Comment: occasionally  . Drug Use: Yes    Special: Marijuana     Comment: marijuana  . Sexually Active: Yes -- Male partner(s)   Other Topics Concern  . Not on file   Social History Narrative  . No narrative on file    Current Outpatient Prescriptions on File Prior to Visit  Medication Sig Dispense Refill  . peg 3350 powder (MOVIPREP) 100 G SOLR Take 1 kit (100 g total) by mouth once.  1 kit  0    Allergies  Allergen Reactions  . Penicillins     Review of Systems  Review of Systems  Constitutional: Negative for fever and malaise/fatigue.  HENT: Negative for congestion.   Eyes: Negative for discharge.  Respiratory: Negative for shortness of breath.   Cardiovascular: Negative for chest pain, palpitations and leg swelling.  Gastrointestinal: Negative for nausea, abdominal pain and diarrhea.  Genitourinary: Negative for dysuria.  Musculoskeletal: Negative for falls.  Skin: Negative for rash.  Neurological: Negative for loss of consciousness and headaches.  Endo/Heme/Allergies: Negative for polydipsia.  Psychiatric/Behavioral: Negative for depression and suicidal ideas. The  patient is not nervous/anxious and does not have insomnia.     Objective  BP 122/81  Pulse 78  Temp 98.4 F (36.9 C) (Temporal)  Ht 5\' 10"  (1.778 m)  Wt 187 lb 6.4 oz (85.004 kg)  BMI 26.89 kg/m2  SpO2 97%  Physical Exam  Physical Exam  Constitutional: He is oriented to person, place, and time and well-developed, well-nourished, and in no distress. No distress.  HENT:  Head: Normocephalic and atraumatic.  Eyes: Conjunctivae normal are normal.  Neck: Neck supple. No thyromegaly present.  Cardiovascular: Normal rate, regular rhythm and normal heart sounds.   No murmur heard. Pulmonary/Chest: Effort normal  and breath sounds normal. No respiratory distress.  Abdominal: He exhibits no distension and no mass. There is no tenderness.  Musculoskeletal: He exhibits no edema.  Neurological: He is alert and oriented to person, place, and time.  Skin: Skin is warm.  Psychiatric: Memory, affect and judgment normal.    Lab Results  Component Value Date   TSH 1.47 01/19/2012   Lab Results  Component Value Date   WBC 5.1 01/25/2012   HGB 15.0 01/25/2012   HCT 45.3 01/25/2012   MCV 89.8 01/25/2012   PLT 205.0 01/25/2012   Lab Results  Component Value Date   CREATININE 0.9 01/25/2012   BUN 19 01/25/2012   NA 139 01/25/2012   K 3.5 01/25/2012   CL 106 01/25/2012   CO2 24 01/25/2012   Lab Results  Component Value Date   ALT 114* 01/25/2012   AST 52* 01/25/2012   ALKPHOS 53 01/25/2012   BILITOT 0.4 01/25/2012      Assessment & Plan  Reflux Only one episode since last visit. May use Tums or Maalox prn for infrequent episodes  Diarrhea Has dropped from 8-10 episodes daily to 5-6 with the addition of fiber and probiotics, agrees to proceed with colonoscopy and to consider avoiding gluten for 3 months to see if his symptoms improve. Call if symptoms worsen  Elevated BP Numbers good today, no changes, minimize sodium  Elevated liver function tests Repeat LFTs today. Hepatitis panel negative. Minimize saturated fats and simple carbs

## 2012-02-15 LAB — HEPATIC FUNCTION PANEL
ALT: 117 U/L — ABNORMAL HIGH (ref 0–53)
AST: 49 U/L — ABNORMAL HIGH (ref 0–37)
Bilirubin, Direct: 0.1 mg/dL (ref 0.0–0.3)
Total Bilirubin: 0.4 mg/dL (ref 0.3–1.2)

## 2012-02-29 ENCOUNTER — Other Ambulatory Visit: Payer: BC Managed Care – PPO | Admitting: Gastroenterology

## 2012-03-03 ENCOUNTER — Other Ambulatory Visit (INDEPENDENT_AMBULATORY_CARE_PROVIDER_SITE_OTHER): Payer: BC Managed Care – PPO

## 2012-03-03 DIAGNOSIS — R7989 Other specified abnormal findings of blood chemistry: Secondary | ICD-10-CM

## 2012-03-03 LAB — HEPATIC FUNCTION PANEL
ALT: 128 U/L — ABNORMAL HIGH (ref 0–53)
AST: 51 U/L — ABNORMAL HIGH (ref 0–37)
Total Bilirubin: 0.5 mg/dL (ref 0.3–1.2)
Total Protein: 6.7 g/dL (ref 6.0–8.3)

## 2012-03-06 ENCOUNTER — Other Ambulatory Visit: Payer: Self-pay | Admitting: Gastroenterology

## 2012-03-06 ENCOUNTER — Other Ambulatory Visit: Payer: BC Managed Care – PPO

## 2012-03-06 ENCOUNTER — Encounter: Payer: Self-pay | Admitting: Gastroenterology

## 2012-03-06 ENCOUNTER — Other Ambulatory Visit: Payer: Self-pay

## 2012-03-06 ENCOUNTER — Other Ambulatory Visit: Payer: BC Managed Care – PPO | Admitting: Gastroenterology

## 2012-03-06 ENCOUNTER — Ambulatory Visit (AMBULATORY_SURGERY_CENTER): Payer: BC Managed Care – PPO | Admitting: Gastroenterology

## 2012-03-06 ENCOUNTER — Ambulatory Visit (INDEPENDENT_AMBULATORY_CARE_PROVIDER_SITE_OTHER)
Admission: RE | Admit: 2012-03-06 | Discharge: 2012-03-06 | Disposition: A | Discharge status: Home / Self Care | Payer: BC Managed Care – PPO | Source: Ambulatory Visit | Attending: Gastroenterology | Admitting: Gastroenterology

## 2012-03-06 ENCOUNTER — Telehealth: Payer: Self-pay | Admitting: Oncology

## 2012-03-06 ENCOUNTER — Telehealth: Payer: Self-pay | Admitting: *Deleted

## 2012-03-06 VITALS — BP 115/68 | HR 61 | Temp 98.4°F | Resp 13 | Ht 70.0 in | Wt 191.0 lb

## 2012-03-06 DIAGNOSIS — R197 Diarrhea, unspecified: Secondary | ICD-10-CM

## 2012-03-06 DIAGNOSIS — C20 Malignant neoplasm of rectum: Secondary | ICD-10-CM

## 2012-03-06 DIAGNOSIS — K6289 Other specified diseases of anus and rectum: Secondary | ICD-10-CM

## 2012-03-06 DIAGNOSIS — C801 Malignant (primary) neoplasm, unspecified: Secondary | ICD-10-CM

## 2012-03-06 DIAGNOSIS — K921 Melena: Secondary | ICD-10-CM

## 2012-03-06 DIAGNOSIS — R7989 Other specified abnormal findings of blood chemistry: Secondary | ICD-10-CM

## 2012-03-06 HISTORY — PX: RECTAL BIOPSY: SHX2303

## 2012-03-06 HISTORY — DX: Malignant neoplasm of rectum: C20

## 2012-03-06 HISTORY — DX: Malignant (primary) neoplasm, unspecified: C80.1

## 2012-03-06 MED ORDER — IOHEXOL 300 MG/ML  SOLN
100.0000 mL | Freq: Once | INTRAMUSCULAR | Status: AC | PRN
  Administered 2012-03-06: 100 mL via INTRAVENOUS

## 2012-03-06 MED ORDER — SODIUM CHLORIDE 0.9 % IV SOLN: 500.0000 mL | INTRAVENOUS | Status: DC

## 2012-03-06 NOTE — Op Note (Signed)
[facility] [address] [place], [address]   COLONOSCOPY PROCEDURE REPORT  PATIENT: Jacob [name], Jacob [name]  MR#: [id number] BIRTHDATE: Mar 09, 1971 , 41  yrs. old GENDER: Male ENDOSCOPIST: Rachael Fee, MD REFERRED BM:[id number] Abner Greenspan, M.D. PROCEDURE DATE:  03/06/2012 PROCEDURE:   Colonoscopy with biopsy ASA CLASS:   Class II INDICATIONS:minor rectal bleeding, loose stools, elevated liver tests. MEDICATIONS: Fentanyl 100 mcg IV, Versed 10 mg IV, and These medications were titrated to patient response per physician's verbal order  DESCRIPTION OF PROCEDURE:   After the risks benefits and alternatives of the procedure were thoroughly explained, informed consent was obtained.  A digital rectal exam revealed a palpable rectal mass.   The LB [id number]  endoscope was introduced through the anus and advanced to the cecum, which was identified by both the appendix and ileocecal valve. No adverse events experienced.   The quality of the prep was good, using MoviPrep The instrument was then slowly withdrawn as the colon was fully examined.  COLON FINDINGS: There was a malignant appearing mass in distal rectum.  The mass was heaped up, ulcerated, friable, approximately 3cm across, occupying 1/2 the circumference of the distal rectum, distal edge of the mass was 1-2cm from anal verge.  The mass was extensively biopsied and sent to pathology.  The examination was otherwise normal.  Retroflexed views revealed no abnormalities. The time to cecum=4 minutes 22 seconds.  Withdrawal time=9 minutes 39 seconds.  The scope was withdrawn and the procedure completed. COMPLICATIONS: There were no complications.  ENDOSCOPIC IMPRESSION: There was a malignant appearing mass in distal rectum.  The mass was heaped up, ulcerated, friable, approximately 3cm across, occupying 1/2 the circumference of the distal rectum, distal edge of the mass was 1-2cm from anal verge.  The mass was  extensively biopsied and sent to pathology.  The examination was otherwise normal.  RECOMMENDATIONS: My office will arrange staging testing (CT scan chest, abdomen, pelvis with IV and PO contrast; lab testing including CEA).  Will also begin referral process to oncology (Dr.  Mancel Bale), radiation oncology, and general surgery (Dr.  Romie Levee). Pending the result of CT scan, you may need endoscopic ultrasound procedure as well.   eSigned:  Rachael Fee, MD 03/06/2012 11:44 AM

## 2012-03-06 NOTE — Progress Notes (Signed)
Patient did not experience any of the following events: a burn prior to discharge; a fall within the facility; wrong site/side/patient/procedure/implant event; or a hospital transfer or hospital admission upon discharge from the facility. (G8907) Patient did not have preoperative order for IV antibiotic SSI prophylaxis. (G8918)  

## 2012-03-06 NOTE — Telephone Encounter (Signed)
Spoke with patient by phone.  Dr. Truett Perna can see him tomorrow at 2:00.  Confirmed the appointment with patient.  He verbalized understanding.

## 2012-03-06 NOTE — Patient Instructions (Signed)
You will go to basement lab today for bloodwork.  Drink one bottle of contrast at 1230. Second bottle at 130.  CT scan at [facility] CT at 1430     YOU HAD AN ENDOSCOPIC PROCEDURE TODAY AT THE [facility]: Refer to the procedure report that was given to you for any specific questions about what was found during the examination.  If the procedure report does not answer your questions, please call your gastroenterologist to clarify.  If you requested that your care partner not be given the details of your procedure findings, then the procedure report has been included in a sealed envelope for you to review at your convenience later.  YOU SHOULD EXPECT: Some feelings of bloating in the abdomen. Passage of more gas than usual.  Walking can help get rid of the air that was put into your GI tract during the procedure and reduce the bloating. If you had a lower endoscopy (such as a colonoscopy or flexible sigmoidoscopy) you may notice spotting of blood in your stool or on the toilet paper. If you underwent a bowel prep for your procedure, then you may not have a normal bowel movement for a few days.  DIET: Your first meal following the procedure should be a light meal and then it is ok to progress to your normal diet.  A half-sandwich or bowl of soup is an example of a good first meal.  Heavy or fried foods are harder to digest and may make you feel nauseous or bloated.  Likewise meals heavy in dairy and vegetables can cause extra gas to form and this can also increase the bloating.  Drink plenty of fluids but you should avoid alcoholic beverages for 24 hours.  ACTIVITY: Your care partner should take you home directly after the procedure.  You should plan to take it easy, moving slowly for the rest of the day.  You can resume normal activity the day after the procedure however you should NOT DRIVE or use heavy machinery for 24 hours (because of the sedation medicines used during the test).     SYMPTOMS TO REPORT IMMEDIATELY: A gastroenterologist can be reached at any hour.  During normal business hours, 8:30 AM to 5:00 PM Monday through Friday, call [phone].  After hours and on weekends, please call the GI answering service at [phone] who will take a message and have the physician on call contact you.   Following lower endoscopy (colonoscopy or flexible sigmoidoscopy):  Excessive amounts of blood in the stool  Significant tenderness or worsening of abdominal pains  Swelling of the abdomen that is new, acute  Fever of 100F or higher  FOLLOW UP: If any biopsies were taken you will be contacted by phone or by letter within the next 1-3 weeks.  Call your gastroenterologist if you have not heard about the biopsies in 3 weeks.  Our staff will call the home number listed on your records the next business day following your procedure to check on you and address any questions or concerns that you may have at that time regarding the information given to you following your procedure. This is a courtesy call and so if there is no answer at the home number and we have not heard from you through the emergency physician on call, we will assume that you have returned to your regular daily activities without incident.  SIGNATURES/CONFIDENTIALITY: You and/or your care partner have signed paperwork which will be entered into your electronic  medical record.  These signatures attest to the fact that that the information above on your After Visit Summary has been reviewed and is understood.  Full responsibility of the confidentiality of this discharge information lies with you and/or your care-partner.  

## 2012-03-06 NOTE — Progress Notes (Signed)
Pathology sent rush per D.O. 

## 2012-03-06 NOTE — Telephone Encounter (Signed)
Added new pt appt for 12/17 @ 2pm w/FC @ 1:30pm per Almira Coaster (BS). Per Almira Coaster pt informed.

## 2012-03-07 ENCOUNTER — Telehealth: Payer: Self-pay | Admitting: *Deleted

## 2012-03-07 ENCOUNTER — Ambulatory Visit (HOSPITAL_BASED_OUTPATIENT_CLINIC_OR_DEPARTMENT_OTHER): Payer: BC Managed Care – PPO | Admitting: Oncology

## 2012-03-07 ENCOUNTER — Telehealth: Payer: Self-pay | Admitting: Gastroenterology

## 2012-03-07 ENCOUNTER — Ambulatory Visit (HOSPITAL_BASED_OUTPATIENT_CLINIC_OR_DEPARTMENT_OTHER): Payer: BC Managed Care – PPO

## 2012-03-07 ENCOUNTER — Encounter: Payer: Self-pay | Admitting: Oncology

## 2012-03-07 ENCOUNTER — Telehealth: Payer: Self-pay | Admitting: Oncology

## 2012-03-07 VITALS — BP 130/73 | HR 75 | Temp 99.1°F | Resp 20 | Ht 70.5 in | Wt 188.6 lb

## 2012-03-07 DIAGNOSIS — C20 Malignant neoplasm of rectum: Secondary | ICD-10-CM

## 2012-03-07 DIAGNOSIS — K7689 Other specified diseases of liver: Secondary | ICD-10-CM

## 2012-03-07 DIAGNOSIS — R7989 Other specified abnormal findings of blood chemistry: Secondary | ICD-10-CM

## 2012-03-07 NOTE — Patient Instructions (Signed)
Start over the counter stool softener (colace/docusate sodium) twice daily to help soften stool to ease through GI tract. Push po fluids

## 2012-03-07 NOTE — Progress Notes (Signed)
Dr. Christella Hartigan advised that per Weyman Rodney, RN at oncology they will set the patient up for Radiation Oncology and Surgery per Dr. Kalman Drape instructions today.  He states that they will take care of it and advise the patient today at the appt

## 2012-03-07 NOTE — Progress Notes (Signed)
[facility] New Patient Consult   Referring MD: Samba Cumba 41 y.o.  08/07/1970    Reason for Referral: Rectal     HPI: Jacob [name] reports abnormal liver enzymes were found when Jacob [name] was being tested for life insurance. An abdominal ultrasound on 01/21/2012 revealed hepatic steatosis. Jacob [name] was referred to Dr. Christella Hartigan for further evaluation. Jacob [name] complained of abdominal bloating, constipation, and rectal bleeding for the past 6-12 months.  Dr. Christella Hartigan performed a colonoscopy on 03/06/2012- a malignant-appearing mass was noted in the distal rectum. The mass occupied one half of the circumference of the distal rectum with the distal edge of the mass 1-2 cm from the anal verge. The examination was otherwise normal. A biopsy confirmed adenocarcinoma.  Jacob [name] is scheduled for endoscopic ultrasound. Jacob [name] has been referred to radiation oncology and Dr. Maisie Fus.    Past Medical History  Diagnosis Date  . Chicken pox as a child    ?  . Hernia 6 months old  . Allergic state 01/19/2012  . Reflux 01/19/2012  .  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  01/19/2012  .  01/19/2012  .  01/19/2012  .  01/19/2012  . Elevated BP 01/19/2012  . Sun-damaged skin 01/19/2012  . Migraine headache as a child    Past Surgical History  Procedure Date  . Hernia repair 6 months old    right inguinal repair    Family History  Problem Relation Age of Onset  . Adopted: Yes  Jacob [name] is unaware of the family cancer history  Current outpatient prescriptions:docusate sodium (COLACE) 100 MG capsule, Take 100 mg by mouth 2 (two) times daily., Disp: , Rfl:   Allergies:  Allergies  Allergen Reactions  . Penicillins     Does not recall    Social History: Jacob [name] lives with his wife in [place]. Jacob [name] has a 66-year-old daughter. 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 Jacob [name] does not use tobacco, Jacob [name] drinks alcohol occasionally. No history of heavy alcohol use. No risk factor for HIV or  hepatitis.  ROS:   Positives include: Abdominal bloating, intermittent rectal bleeding, weight gain, occasional reflux/heartburn, occasional headaches, anorexia, 5 pound weight loss A complete ROS was otherwise negative.  Physical Exam:  Blood pressure 130/73, pulse 75, temperature 99.1 F (37.3 C), temperature source Oral, resp. rate 20, height 5' 10.5" (1.791 m), weight 188 lb 9.6 oz (85.548 kg).  HEENT: Oropharynx without visible mass, neck without mass Lungs: Lungs clear bilaterally Cardiac: Regular rate and rhythm Abdomen: No hepatosplenomegaly, no mass, nontender-rectal exam deferred GU: The right testicle is smaller than the left side, no mass  Vascular: No leg edema Lymph nodes: No cervical, supraclavicular, axillary, or inguinal nodes Neurologic: Alert and oriented, the motor exam appears intact in the upper and lower extremities Skin: No rash   LAB:  CBC  Lab Results  Component Value Date   WBC 5.1 01/25/2012   HGB 15.0 01/25/2012   HCT 45.3 01/25/2012   MCV 89.8 01/25/2012   PLT 205.0 01/25/2012     CMP      Component Value Date/Time   NA 139 01/25/2012 1003   K 3.5 01/25/2012 1003   CL 106 01/25/2012 1003   CO2 24 01/25/2012 1003   GLUCOSE 105* 01/25/2012 1003   BUN 19 01/25/2012 1003   CREATININE 0.9 01/25/2012 1003   CALCIUM 9.2 01/25/2012 1003   PROT 6.7 03/03/2012 1222  ALBUMIN 4.0 03/03/2012 1222   AST 51* 03/03/2012 1222   ALT 128* 03/03/2012 1222   ALKPHOS 49 03/03/2012 1222   BILITOT 0.5 03/03/2012 1222   CEA on 03/06/2012-5.7  Radiology: CT abdomen and pelvis on 03/06/2012-8 mm low-density lesion in the posterior right liver, nonspecific. Perirectal fat stranding is present with tiny lymph nodes in the perirectal fat. No inguinal adenopathy. Mural thickening of the posterior aspect of the rectum on the sagittal images.    Assessment/Plan:   1. Rectal cancer-partially obstructing mass noted 1-2 cm from the anal verge on a colonoscopy  03/06/2012.  2. Irregular bowel habits/rectal bleeding secondary to #1  3. Mild elevation of the liver enzymes,? Secondary to hepatic steatosis  4. Indeterminate 8 mm posterior right liver lesion on the staging CT 03/06/2012   Disposition:   Jacob [name] has been diagnosed with rectal cancer. I discussed treatment options with Jacob [name] and his wife. Jacob [name] understands the majority of patients with rectal cancer are treated with neoadjuvant chemotherapy/radiation unless an early stage lesion is confirmed at endoscopic ultrasound. Jacob [name] is scheduled for an endoscopic ultrasound 03/14/2012.  Jacob [name] will likely be treated with neoadjuvant therapy regardless of the ultrasound findings in an attempt to downstage the tumor so that Jacob [name] may avoid a permanent colostomy. Jacob [name] has been referred to Dr. Maisie Fus. I explained the likely need for a temporary ileostomy and the potential need for a permanent colostomy.  His case will be presented at the GI tumor conference on 03/08/2012. Jacob [name] is scheduled to see Dr. Mitzi Hansen 03/09/2012. I recommend concurrent capecitabine and radiation. I anticipate a treatment start date of 03/20/2012.  We reviewed the potential toxicities associated with capecitabine including the chance for nausea, mucositis, diarrhea, and hematologic toxicity. We discussed the skin rash, skin hyperpigmentation, and hand/foot syndrome associated with capecitabine. I also reviewed the likelihood of skin breakdown at the groin/perineum with combined therapy.  Jacob [name] will attend a chemotherapy teaching class. We will schedule a staging chest CT. Jacob [name] will be scheduled for an office visit during the second week of chemotherapy/radiation.  Approximately 60 minutes were spent with patient today. The majority of the time was spent in counseling/coordination of care. Jacob [name] 03/07/2012, 6:27 PM

## 2012-03-07 NOTE — Progress Notes (Signed)
Met with patient and wife.  Explained role of nurse navigator.  Educational information given to patient on rectal cancer.  Resources for [facility] given along with phone numbers for staff and main center.  Referral made for dietician.  Appointments coordinated with rad. Onc. And surgeon.  Patient verbalized understanding and confirmed future appointments.  Will continue to follow.

## 2012-03-07 NOTE — Telephone Encounter (Signed)
i lm on home phone to discuss CT, CEA and to schedule rectal EUS for later this week.

## 2012-03-07 NOTE — Telephone Encounter (Signed)
  Follow up Call-  Call back number 03/06/2012  Post procedure Call Back phone  # [phone]  Permission to leave phone message Yes     Patient questions:  Do you have a fever, pain , or abdominal swelling? no Pain Score  0 *  Have you tolerated food without any problems? yes  Have you been able to return to your normal activities? yes  Do you have any questions about your discharge instructions: Diet   no Medications  no Follow up visit  no  Do you have questions or concerns about your Care? no  Actions: * If pain score is 4 or above: No action needed, pain <4.

## 2012-03-07 NOTE — Telephone Encounter (Signed)
We discussed CT, path results.  Planning for EUS in next few days.

## 2012-03-07 NOTE — Telephone Encounter (Signed)
appts made and printed for pt aom °

## 2012-03-08 ENCOUNTER — Encounter: Payer: Self-pay | Admitting: Radiation Oncology

## 2012-03-09 ENCOUNTER — Inpatient Hospital Stay: Admission: RE | Admit: 2012-03-09 | Payer: BC Managed Care – PPO | Source: Ambulatory Visit

## 2012-03-09 ENCOUNTER — Ambulatory Visit
Admission: RE | Admit: 2012-03-09 | Discharge: 2012-03-09 | Disposition: A | Discharge status: Home / Self Care | Payer: BC Managed Care – PPO | Source: Ambulatory Visit | Attending: Radiation Oncology | Admitting: Radiation Oncology

## 2012-03-09 ENCOUNTER — Encounter: Payer: Self-pay | Admitting: Radiation Oncology

## 2012-03-09 ENCOUNTER — Other Ambulatory Visit: Payer: BC Managed Care – PPO

## 2012-03-09 ENCOUNTER — Telehealth: Payer: Self-pay

## 2012-03-09 ENCOUNTER — Encounter: Payer: Self-pay | Admitting: *Deleted

## 2012-03-09 VITALS — BP 142/87 | HR 70 | Temp 98.5°F | Resp 20 | Ht 70.5 in | Wt 189.9 lb

## 2012-03-09 DIAGNOSIS — C20 Malignant neoplasm of rectum: Secondary | ICD-10-CM | POA: Insufficient documentation

## 2012-03-09 DIAGNOSIS — K6289 Other specified diseases of anus and rectum: Secondary | ICD-10-CM

## 2012-03-09 DIAGNOSIS — K219 Gastro-esophageal reflux disease without esophagitis: Secondary | ICD-10-CM | POA: Insufficient documentation

## 2012-03-09 DIAGNOSIS — C801 Malignant (primary) neoplasm, unspecified: Secondary | ICD-10-CM

## 2012-03-09 HISTORY — DX: Malignant neoplasm of rectum: C20

## 2012-03-09 HISTORY — DX: Personal history of other diseases of the nervous system and sense organs: Z86.69

## 2012-03-09 HISTORY — DX: Malignant (primary) neoplasm, unspecified: C80.1

## 2012-03-09 NOTE — Telephone Encounter (Signed)
Pt appt for chest is 03/10/12 2 pm arrive 15 minutes early and have nothing to eat or drink 2 hours prior

## 2012-03-09 NOTE — Telephone Encounter (Signed)
If he hasn't had chest CT then yes, he needs one.

## 2012-03-09 NOTE — Telephone Encounter (Signed)
Left message on machine to call back regarding CT chest

## 2012-03-09 NOTE — Telephone Encounter (Signed)
Dr Christella Hartigan I was reviewing the information from [facility] procedures and it looks like the pt needed a chest CT as well but only abd and pelvis were ordered.  I spoke with Dottie and she said that one of the nurses called and only asked for CT abd and pelvis.  Do I need to get a CT chest now?

## 2012-03-09 NOTE — Progress Notes (Signed)
Please see the Nurse Progress Note in the MD Initial Consult Encounter for this patient. 

## 2012-03-09 NOTE — Progress Notes (Signed)
Pt denies loss of appetite, fatigue, diarrhea, constipation, rectal bleeding, pain.   He is married, 1 daughter 41 yrs old. He teaches sculpting.

## 2012-03-09 NOTE — Progress Notes (Signed)
Radiation Oncology         [phone] ________________________________  Name: Jacob [name] MRN: [id number]  Date: 03/09/2012  DOB: 06-25-1970  WU:JWJXB, Misty Stanley, MD  Ladene Artist, MD     REFERRING PHYSICIAN: Ladene Artist, MD   DIAGNOSIS: The encounter diagnosis was Rectal cancer.   HISTORY OF PRESENT ILLNESS::Jacob [name] is a 41 y.o. male who is seen for an initial consultation visit. 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 Some liver enzymes were elevated and this prompted additional GI workup. The patient also complained of some alternating constipation and diarrhea over the last 6-12 months as well as some abdominal bloating. A colonoscopy was performed. This showed a malignant appearing mass in the distal rectum. This measured approximately 3 cm and occupied half of the circumference of the distal rectum. The distal edge of the mass was 1-2 cm from the anal verge. The mass was biopsied and this has returned positive for adenocarcinoma. The patient has an endoscopic ultrasound scheduled for next week on Tuesday.  A CT scan of the abdomen and pelvis has been performed. There is some posterior rectal wall thickening with presacral fat stranding. No clear lymphadenopathy was seen. A tiny lesion was present within the right hepatic lobe which is too small to characterize. Attention on followup was recommended. There is therefore no clear evidence of regional or distant disease thus far. He does have a CT scan of the chest pending as well.  The patient's case has been discussed at GI tumor conference and he has been seen by medical oncology. I been asked to see him today for consideration of neoadjuvant radiotherapy.   PREVIOUS RADIATION THERAPY: No   PAST MEDICAL HISTORY:  has a past medical history of Chicken pox (as a child); Hernia (6 months old); Allergic state (01/19/2012); Reflux (01/19/2012); Elevated liver  function tests (01/19/2012); Overweight (01/19/2012); Rectal bleeding (01/19/2012); Diarrhea (01/19/2012); Preventative health care (01/19/2012); Elevated BP (01/19/2012); Sun-damaged skin (01/19/2012); Migraine headache (as a child); Rectal bleeding; migraines; Rectal cancer (03/06/12); Allergy; Cancer (03/06/12); and GERD (gastroesophageal reflux disease).     PAST SURGICAL HISTORY: Past Surgical History  Procedure Date  . Hernia repair 6 months old    right inguinal repair  . Rectal biopsy 03/06/12    distal mass1-2cm from anal verge=Adenocarcinoma     FAMILY HISTORY: family history includes Cancer in his father and Heart disease in his father.  There is no history of Colon cancer.  He is adopted.   SOCIAL HISTORY:  reports that he has never smoked. He has never used smokeless tobacco. He reports that he drinks alcohol. He reports that he uses illicit drugs (Marijuana).   ALLERGIES: Penicillins   MEDICATIONS:  Current Outpatient Prescriptions  Medication Sig Dispense Refill  . docusate sodium (COLACE) 100 MG capsule Take 100 mg by mouth 2 (two) times daily.         REVIEW OF SYSTEMS:  A 15 point review of systems is documented in the electronic medical record. This was obtained by the nursing staff. However, I reviewed this with the patient to discuss relevant findings and make appropriate changes.  Pertinent items are noted in HPI.    PHYSICAL EXAM:  height is 5' 10.5" (1.791 m) and weight is 189 lb 14.4 oz (86.138 kg). His oral temperature is 98.5 F (36.9 C). His blood pressure is 142/87 and his pulse is 70. His respiration is 20.  General: Well-developed, in no acute distress HEENT: Normocephalic, atraumatic; oral cavity clear Neck: Supple without any lymphadenopathy Cardiovascular: Regular rate and rhythm Respiratory: Clear to auscultation bilaterally GI: Soft, nontender, normal bowel sounds Extremities: No edema present Neuro: No focal deficits Rectal: A low-lying  rectal tumor was present on the left, no blood on exam glove. This was present approximately 2 cm proximal to the anal verge.    LABORATORY DATA:  Lab Results  Component Value Date   WBC 5.1 01/25/2012   HGB 15.0 01/25/2012   HCT 45.3 01/25/2012   MCV 89.8 01/25/2012   PLT 205.0 01/25/2012   Lab Results  Component Value Date   NA 139 01/25/2012   K 3.5 01/25/2012   CL 106 01/25/2012   CO2 24 01/25/2012   Lab Results  Component Value Date   ALT 128* 03/03/2012   AST 51* 03/03/2012   ALKPHOS 49 03/03/2012   BILITOT 0.5 03/03/2012      RADIOGRAPHY: Ct Abdomen Pelvis W Contrast  03/06/2012  *RADIOLOGY REPORT*  Clinical Data: Rectal malignancy.  Increased bowel movements with bloody stool for 1 year.  Abnormal colonoscopy.  CT ABDOMEN AND PELVIS WITH CONTRAST  Technique:  Multidetector CT imaging of the abdomen and pelvis was performed following the standard protocol during bolus administration of intravenous contrast.  Contrast: OMNIPAQUE IOHEXOL 300 MG/ML  SOLN  Comparison: None.  Findings: Lung Bases: Dependent atelectasis.  Liver:  8 mm low density lesion is present posterior right hepatic lobe (image number 23 series 2).  This tiny lesion is nonspecific. Statistically this likely represents a cyst but is too small to definitively characterize.  Attention on follow-up is recommended.  Spleen:  Normal.  Gallbladder:  Normal.  Common bile duct:  Normal.  Pancreas:  Normal.  Adrenal glands:  Normal.  Kidneys:  Normal enhancement.  Normal delayed excretion of contrast.  Both ureters appear within normal limits.  Stomach:  Normal.  Small bowel:  No obstruction.  No inflammatory changes.  Small bowel mesentery appears within normal limits.  Colon:   Normal decompressed appendix in the right lower quadrant. Ascending colon appears within normal limits.  Transverse and descending colon appear normal.  Sigmoid colon is decompressed. Mild sigmoid diverticulosis without diverticulitis.  Pararectal  fat stranding is present extending from the dorsal rectum to the anterior sacrum.  Tiny lymph nodes are present in the pararectal fat.  There is no inguinal adenopathy.  On the sagittal images, there is mural thickening of the posterior aspect of the rectum which may be inflammatory or neoplastic.  Pelvic Genitourinary:  Prostatomegaly.  Prostate calcifications. Urinary bladder appears normal.  No free fluid.  No iliac or pelvic sidewall adenopathy.  Bones:  No aggressive osseous lesions.  No osteolysis of the sacrum or coccyx to suggest regional invasion.  Mild right hip osteoarthritis with marginal osteophytes and subchondral cysts in the acetabular roof.  Vasculature: Normal.  IMPRESSION: 1. Posterior rectal wall mural thickening with presacral fat stranding.  This may be inflammatory or neoplastic. No pelvic or inguinal adenopathy in this patient with suspected rectal cancer No evidence of distant metastatic disease. 2.  Tiny low density lesion in the right hepatic lobe measuring 8 mm is too small to characterize.  Attention on follow-up is recommended.   Original Report Authenticated By: Andreas Newport, M.D.        IMPRESSION: The patient is a 41 year old male with a recent diagnosis of a distal rectal adenocarcinoma. Further workup pending including a CT scan  of the chest and an endoscopic ultrasound. No evidence of regional or distant disease at this time.   PLAN: Patient's case has been discussed at GI conference. We anticipate the need for neoadjuvant chemoradiotherapy followed by surgical resection. If the patient's ultrasound demonstrates a T3 tumor, then this would be a standard recommendation. As we discussed in conference, this is a very low lying lesion and it is felt that this approach, involving initial chemoradiotherapy, will also be helpful for surgical resection even if this represents a T2 tumor to help with a sphincter sparing approach.  I therefore discussed a potential 5-1/2 week  course of radiotherapy given with concurrent chemotherapy. Dr. Truett Perna anticipates the patient taking Xeloda during radiation. We discussed therefore in detail the rationale of radiation in this setting in addition to the potential side effects and risks. All of his questions were answered. The patient is interested in proceeding with treatment planning.  I will go ahead and ask our staff to contact the patient to schedule a simulation sometime next week such that we can proceed with treatment planning. We anticipate beginning chemoradiotherapy on 03/20/2012.    I spent 60 minutes minutes face to face with the patient and more than 50% of that time was spent in counseling and/or coordination of care.    ________________________________   Radene Gunning, MD, PhD

## 2012-03-10 ENCOUNTER — Inpatient Hospital Stay: Admission: RE | Admit: 2012-03-10 | Payer: BC Managed Care – PPO | Source: Ambulatory Visit

## 2012-03-10 ENCOUNTER — Other Ambulatory Visit: Payer: Self-pay | Admitting: *Deleted

## 2012-03-10 ENCOUNTER — Other Ambulatory Visit: Payer: Self-pay | Admitting: Oncology

## 2012-03-10 DIAGNOSIS — C20 Malignant neoplasm of rectum: Secondary | ICD-10-CM

## 2012-03-10 MED ORDER — PROCHLORPERAZINE MALEATE 10 MG PO TABS: 10.0000 mg | ORAL_TABLET | Freq: Four times a day (QID) | ORAL | Status: DC | PRN

## 2012-03-10 MED ORDER — CAPECITABINE 500 MG PO TABS: ORAL_TABLET | ORAL | Status: DC

## 2012-03-10 NOTE — Telephone Encounter (Signed)
Left VM for patient that Xeloda script was sent to [facility] and to call when he receives drug. Call by 12/24 if he has not heard from the pharmacy by then. Also made him aware that Compazine script has been sent to his [facility] pharmacy and suggested he also purchase Imodium to have on hand.

## 2012-03-10 NOTE — Telephone Encounter (Signed)
I spoke with the pt and he prefers to move the CT to another day.  03/13/12 2 pm pt has been notified

## 2012-03-10 NOTE — Addendum Note (Signed)
Encounter addended by: Harolyn Cocker Mintz Olyvia Gopal, RN on: 03/10/2012  6:00 PM<BR>     Documentation filed: Charges VN

## 2012-03-10 NOTE — Telephone Encounter (Signed)
Left message on machine to call back  

## 2012-03-13 ENCOUNTER — Ambulatory Visit (INDEPENDENT_AMBULATORY_CARE_PROVIDER_SITE_OTHER)
Admission: RE | Admit: 2012-03-13 | Discharge: 2012-03-13 | Disposition: A | Discharge status: Home / Self Care | Payer: BC Managed Care – PPO | Source: Ambulatory Visit | Attending: Gastroenterology | Admitting: Gastroenterology

## 2012-03-13 DIAGNOSIS — R198 Other specified symptoms and signs involving the digestive system and abdomen: Secondary | ICD-10-CM

## 2012-03-13 MED ORDER — IOHEXOL 300 MG/ML  SOLN
80.0000 mL | Freq: Once | INTRAMUSCULAR | Status: AC | PRN
  Administered 2012-03-13: 80 mL via INTRAVENOUS

## 2012-03-14 ENCOUNTER — Encounter (HOSPITAL_COMMUNITY): Payer: Self-pay | Admitting: *Deleted

## 2012-03-14 ENCOUNTER — Encounter (HOSPITAL_COMMUNITY)
Admission: RE | Disposition: A | Discharge status: Home / Self Care | Payer: Self-pay | Source: Ambulatory Visit | Attending: Gastroenterology

## 2012-03-14 ENCOUNTER — Encounter: Payer: Self-pay | Admitting: *Deleted

## 2012-03-14 ENCOUNTER — Ambulatory Visit (HOSPITAL_COMMUNITY)
Admission: RE | Admit: 2012-03-14 | Discharge: 2012-03-14 | Disposition: A | Discharge status: Home / Self Care | Payer: BC Managed Care – PPO | Source: Ambulatory Visit | Attending: Gastroenterology | Admitting: Gastroenterology

## 2012-03-14 ENCOUNTER — Ambulatory Visit: Payer: BC Managed Care – PPO | Admitting: Radiation Oncology

## 2012-03-14 DIAGNOSIS — C20 Malignant neoplasm of rectum: Secondary | ICD-10-CM | POA: Insufficient documentation

## 2012-03-14 DIAGNOSIS — K219 Gastro-esophageal reflux disease without esophagitis: Secondary | ICD-10-CM | POA: Insufficient documentation

## 2012-03-14 HISTORY — PX: EUS: SHX5427

## 2012-03-14 SURGERY — ULTRASOUND, LOWER GI TRACT, ENDOSCOPIC
Anesthesia: Moderate Sedation

## 2012-03-14 MED ORDER — SPOT INK MARKER SYRINGE KIT
PACK | SUBMUCOSAL | Status: DC | PRN
  Administered 2012-03-14: 4 mL via SUBMUCOSAL

## 2012-03-14 MED ORDER — MIDAZOLAM HCL 10 MG/2ML IJ SOLN
INTRAMUSCULAR | Status: AC
  Filled 2012-03-14: qty 4

## 2012-03-14 MED ORDER — FENTANYL CITRATE 0.05 MG/ML IJ SOLN
INTRAMUSCULAR | Status: DC | PRN
  Administered 2012-03-14 (×4): 25 ug via INTRAVENOUS

## 2012-03-14 MED ORDER — MIDAZOLAM HCL 10 MG/2ML IJ SOLN
INTRAMUSCULAR | Status: DC | PRN
  Administered 2012-03-14: 1 mg via INTRAVENOUS
  Administered 2012-03-14 (×3): 2 mg via INTRAVENOUS

## 2012-03-14 MED ORDER — FENTANYL CITRATE 0.05 MG/ML IJ SOLN
INTRAMUSCULAR | Status: AC
  Filled 2012-03-14: qty 4

## 2012-03-14 MED ORDER — SPOT INK MARKER SYRINGE KIT
PACK | SUBMUCOSAL | Status: AC
  Filled 2012-03-14: qty 5

## 2012-03-14 MED ORDER — SODIUM CHLORIDE 0.9 % IV SOLN
INTRAVENOUS | Status: DC
  Administered 2012-03-14: 500 mL via INTRAVENOUS

## 2012-03-14 NOTE — H&P (View-Only) (Signed)
Radiation Oncology         [phone] ________________________________  Name: Jacob [name] MRN: [id number]  Date: 03/09/2012  DOB: 10/23/1970  CC:BLYTH, STACEY, [name]  Jacob [name]     REFERRING PHYSICIAN: Sherrill, Jacob [name], [name]   DIAGNOSIS: The encounter diagnosis was Rectal cancer.   HISTORY OF PRESENT ILLNESS::Jacob [name] is a 41 y.o. male who is seen for an initial consultation visit. 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 Some liver enzymes were elevated and this prompted additional GI workup. The patient also complained of some alternating constipation and diarrhea over the last 6-12 months as well as some abdominal bloating. A colonoscopy was performed. This showed a malignant appearing mass in the distal rectum. This measured approximately 3 cm and occupied half of the circumference of the distal rectum. The distal edge of the mass was 1-2 cm from the anal verge. The mass was biopsied and this has returned positive for adenocarcinoma. The patient has an endoscopic ultrasound scheduled for next week on Tuesday.  A CT scan of the abdomen and pelvis has been performed. There is some posterior rectal wall thickening with presacral fat stranding. No clear lymphadenopathy was seen. A tiny lesion was present within the right hepatic lobe which is too small to characterize. Attention on followup was recommended. There is therefore no clear evidence of regional or distant disease thus far. He does have a CT scan of the chest pending as well.  The patient's case has been discussed at GI tumor conference and he has been seen by medical oncology. I been asked to see him today for consideration of neoadjuvant radiotherapy.   PREVIOUS RADIATION THERAPY: No   PAST MEDICAL HISTORY:  has a past medical history of Chicken pox (as a child); Hernia (6 months old); Allergic state (01/19/2012); Reflux (01/19/2012); Elevated liver  function tests (01/19/2012); Overweight (01/19/2012); Rectal bleeding (01/19/2012); Diarrhea (01/19/2012); Preventative health care (01/19/2012); Elevated BP (01/19/2012); Sun-damaged skin (01/19/2012); Migraine headache (as a child); Rectal bleeding; migraines; Rectal cancer (03/06/12); Allergy; Cancer (03/06/12); and GERD (gastroesophageal reflux disease).     PAST SURGICAL HISTORY: Past Surgical History  Procedure Date  . Hernia repair 6 months old    right inguinal repair  . Rectal biopsy 03/06/12    distal mass1-2cm from anal verge=Adenocarcinoma     FAMILY HISTORY: family history includes Cancer in his father and Heart disease in his father.  There is no history of Colon cancer.  He is adopted.   SOCIAL HISTORY:  reports that he has never smoked. He has never used smokeless tobacco. He reports that he drinks alcohol. He reports that he uses illicit drugs (Marijuana).   ALLERGIES: Penicillins   MEDICATIONS:  Current Outpatient Prescriptions  Medication Sig Dispense Refill  . docusate sodium (COLACE) 100 MG capsule Take 100 mg by mouth 2 (two) times daily.         REVIEW OF SYSTEMS:  A 15 point review of systems is documented in the electronic medical record. This was obtained by the nursing staff. However, I reviewed this with the patient to discuss relevant findings and make appropriate changes.  Pertinent items are noted in HPI.    PHYSICAL EXAM:  height is 5' 10.5" (1.791 m) and weight is 189 lb 14.4 oz (86.138 kg). His oral temperature is 98.5 F (36.9 C). His blood pressure is 142/87 and his pulse is 70. His respiration is 20.     General: Well-developed, in no acute distress HEENT: Normocephalic, atraumatic; oral cavity clear Neck: Supple without any lymphadenopathy Cardiovascular: Regular rate and rhythm Respiratory: Clear to auscultation bilaterally GI: Soft, nontender, normal bowel sounds Extremities: No edema present Neuro: No focal deficits Rectal: A low-lying  rectal tumor was present on the left, no blood on exam glove. This was present approximately 2 cm proximal to the anal verge.    LABORATORY DATA:  Lab Results  Component Value Date   WBC 5.1 01/25/2012   HGB 15.0 01/25/2012   HCT 45.3 01/25/2012   MCV 89.8 01/25/2012   PLT 205.0 01/25/2012   Lab Results  Component Value Date   NA 139 01/25/2012   K 3.5 01/25/2012   CL 106 01/25/2012   CO2 24 01/25/2012   Lab Results  Component Value Date   ALT 128* 03/03/2012   AST 51* 03/03/2012   ALKPHOS 49 03/03/2012   BILITOT 0.5 03/03/2012      RADIOGRAPHY: Ct Abdomen Pelvis W Contrast  03/06/2012  *RADIOLOGY REPORT*  Clinical Data: Rectal malignancy.  Increased bowel movements with bloody stool for 1 year.  Abnormal colonoscopy.  CT ABDOMEN AND PELVIS WITH CONTRAST  Technique:  Multidetector CT imaging of the abdomen and pelvis was performed following the standard protocol during bolus administration of intravenous contrast.  Contrast: 100mL OMNIPAQUE IOHEXOL 300 MG/ML  SOLN  Comparison: None.  Findings: Lung Bases: Dependent atelectasis.  Liver:  8 mm low density lesion is present posterior right hepatic lobe (image number 23 series 2).  This tiny lesion is nonspecific. Statistically this likely represents a cyst but is too small to definitively characterize.  Attention on follow-up is recommended.  Spleen:  Normal.  Gallbladder:  Normal.  Common bile duct:  Normal.  Pancreas:  Normal.  Adrenal glands:  Normal.  Kidneys:  Normal enhancement.  Normal delayed excretion of contrast.  Both ureters appear within normal limits.  Stomach:  Normal.  Small bowel:  No obstruction.  No inflammatory changes.  Small bowel mesentery appears within normal limits.  Colon:   Normal decompressed appendix in the right lower quadrant. Ascending colon appears within normal limits.  Transverse and descending colon appear normal.  Sigmoid colon is decompressed. Mild sigmoid diverticulosis without diverticulitis.  Pararectal  fat stranding is present extending from the dorsal rectum to the anterior sacrum.  Tiny lymph nodes are present in the pararectal fat.  There is no inguinal adenopathy.  On the sagittal images, there is mural thickening of the posterior aspect of the rectum which may be inflammatory or neoplastic.  Pelvic Genitourinary:  Prostatomegaly.  Prostate calcifications. Urinary bladder appears normal.  No free fluid.  No iliac or pelvic sidewall adenopathy.  Bones:  No aggressive osseous lesions.  No osteolysis of the sacrum or coccyx to suggest regional invasion.  Mild right hip osteoarthritis with marginal osteophytes and subchondral cysts in the acetabular roof.  Vasculature: Normal.  IMPRESSION: 1. Posterior rectal wall mural thickening with presacral fat stranding.  This may be inflammatory or neoplastic. No pelvic or inguinal adenopathy in this patient with suspected rectal cancer No evidence of distant metastatic disease. 2.  Tiny low density lesion in the right hepatic lobe measuring 8 mm is too small to characterize.  Attention on follow-up is recommended.   Original Report Authenticated By: Geoffrey Lamke, M.D.        IMPRESSION: The patient is a 41-year-old male with a recent diagnosis of a distal rectal adenocarcinoma. Further workup pending including a CT scan   of the chest and an endoscopic ultrasound. No evidence of regional or distant disease at this time.   PLAN: Patient's case has been discussed at GI conference. We anticipate the need for neoadjuvant chemoradiotherapy followed by surgical resection. If the patient's ultrasound demonstrates a T3 tumor, then this would be a standard recommendation. As we discussed in conference, this is a very low lying lesion and it is felt that this approach, involving initial chemoradiotherapy, will also be helpful for surgical resection even if this represents a T2 tumor to help with a sphincter sparing approach.  I therefore discussed a potential 5-1/2 week  course of radiotherapy given with concurrent chemotherapy. Dr. Sherrill anticipates the patient taking Xeloda during radiation. We discussed therefore in detail the rationale of radiation in this setting in addition to the potential side effects and risks. All of his questions were answered. The patient is interested in proceeding with treatment planning.  I will go ahead and ask our staff to contact the patient to schedule a simulation sometime next week such that we can proceed with treatment planning. We anticipate beginning chemoradiotherapy on 03/20/2012.    I spent 60 minutes minutes face to face with the patient and more than 50% of that time was spent in counseling and/or coordination of care.    ________________________________   Sorren Vallier S. Asaf Elmquist, MD, PhD  

## 2012-03-14 NOTE — Interval H&P Note (Signed)
History and Physical Interval Note:  03/14/2012 7:14 AM  Jacob [name]  has presented today for surgery, with the diagnosis of Rectal cancer [154.1]  The various methods of treatment have been discussed with the patient and family. After consideration of risks, benefits and other options for treatment, the patient has consented to  Procedure(s) (LRB) with comments: LOWER ENDOSCOPIC ULTRASOUND (EUS) (N/A) as a surgical intervention .  The patient's history has been reviewed, patient examined, no change in status, stable for surgery.  I have reviewed the patient's chart and labs.  Questions were answered to the patient's satisfaction.     Rob Bunting

## 2012-03-14 NOTE — Op Note (Signed)
[facility] [address] [place], [address]   ENDOSCOPIC ULTRASOUND PROCEDURE REPORT  PATIENT: Jacob [name], Jacob [name]  MR#: [id number] BIRTHDATE: 11-12-1970  GENDER: Male ENDOSCOPIST: Rachael Fee, MD PROCEDURE DATE:  03/14/2012 PROCEDURE:   Lower EUS, submucosal injection ASA CLASS:      Class II INDICATIONS:   recently diagnosed rectal adenocarcinoma. MEDICATIONS: Fentanyl 100 mcg IV and Versed 5 mg IV  DESCRIPTION OF PROCEDURE:   After the risks benefits and alternatives of the procedure were  explained, informed consent was obtained. The patient was then placed in the left, lateral, decubitus postion and IV sedation was administered. Throughout the procedure, the patients blood pressure, pulse and oxygen saturations were monitored continuously.  Under direct visualization, the Pentax Radial EUS [id number]  endoscope was introduced through the anus  and advanced to the sigmoid colon . Water was used as necessary to provide an acoustic interface.  Upon completion of the imaging, water was removed and the patient was sent to the recovery room in satisfactory condition.    Sigmoidoscopic findings: 1. Previously noted distal rectal mass was again viewed. This was ulcerated, raised, located left lateral distal rectum. Approximately 3cm across (non-circumferential) and the distal edge was 1-2cm from anal verge. The lateral sides of the mass were both labeled with [place] Ink following EUS evaluation  EUS findings: 1. The mass above corresponded with a 7.7mm thick, 3.2cm wide hypoechoic, irregularly bordered mass that clearly passed into and through the muscularis propria layer of the distal rectal wall (uT3) 2. There were three small (largest was 7mm) perirectal lymphnodes. These were all round, discrete, hypoechoic, homogeneous; suspicious for malignant involvement (uN1).  Impression: 3.2cm diameter, 7.30mm thick, ulcerated, raised, uT3N1, left lateral rectal  adenocarcinoma with distal edge 1-2cm from anal verge.  The lateral sides of this mass were labeled with [place] Ink.  He will likely benefit from neoadjuvant chemo/XRT   _______________________________ eSigned:  Rachael Fee, MD 03/14/2012 7:55 AM   cc: Dorothy Puffer, MD; Mancel Bale, MD

## 2012-03-14 NOTE — Progress Notes (Signed)
RECEIVED A FAX FROM [facility] CONCERNING A CONFIRMATION OF PRESCRIPTION SHIPMENT FOR XELODA ON 03/13/12 

## 2012-03-16 ENCOUNTER — Ambulatory Visit
Admission: RE | Admit: 2012-03-16 | Discharge: 2012-03-16 | Disposition: A | Discharge status: Home / Self Care | Payer: BC Managed Care – PPO | Source: Ambulatory Visit | Attending: Radiation Oncology | Admitting: Radiation Oncology

## 2012-03-16 ENCOUNTER — Encounter: Payer: Self-pay | Admitting: Radiation Oncology

## 2012-03-16 ENCOUNTER — Encounter (HOSPITAL_COMMUNITY): Payer: Self-pay | Admitting: Gastroenterology

## 2012-03-16 DIAGNOSIS — Z79899 Other long term (current) drug therapy: Secondary | ICD-10-CM | POA: Insufficient documentation

## 2012-03-16 DIAGNOSIS — K5900 Constipation, unspecified: Secondary | ICD-10-CM | POA: Insufficient documentation

## 2012-03-16 DIAGNOSIS — R197 Diarrhea, unspecified: Secondary | ICD-10-CM | POA: Insufficient documentation

## 2012-03-16 DIAGNOSIS — Z510 Encounter for antineoplastic radiation therapy: Secondary | ICD-10-CM | POA: Insufficient documentation

## 2012-03-16 DIAGNOSIS — R110 Nausea: Secondary | ICD-10-CM | POA: Insufficient documentation

## 2012-03-16 DIAGNOSIS — C20 Malignant neoplasm of rectum: Secondary | ICD-10-CM | POA: Insufficient documentation

## 2012-03-16 NOTE — Progress Notes (Signed)
Met with patient to discuss RO billing.  Patient wasn't sure about OOP/deductible and whether he was on calendar or fiscal year.  Will get this information for him and report back to him within a couple of days.

## 2012-03-17 ENCOUNTER — Ambulatory Visit: Payer: BC Managed Care – PPO | Admitting: Radiation Oncology

## 2012-03-17 DIAGNOSIS — C20 Malignant neoplasm of rectum: Secondary | ICD-10-CM | POA: Insufficient documentation

## 2012-03-17 NOTE — Progress Notes (Signed)
  Radiation Oncology         [phone] ________________________________  Name: Jacob [name] MRN: [id number]  Date: 03/16/2012  DOB: 04-04-1970  SIMULATION AND TREATMENT PLANNING NOTE  The patient presented for simulation for the patient's upcoming course of preoperative radiation for the diagnosis of rectal cancer. The patient was placed in a supine position. A customized alpha cradle was constructed toaid in patient immobilization. This complex treatment device will be used on a daily basis during the treatment. In this fashion a CT scan was obtained through the pelvic region and the isocenter was placed near midline within the pelvis.  The patient will initially be planned to receive a course of radiation to a dose of 45 gray. This will be accomplished in 25 fractions at 1.8 gray per fraction. This initial treatment will correspond to a 3-D conformal technique. The gross tumor volume has been contoured in addition to the rectum, bladder and femoral heads. DVH's of each of these structures have been requested and these will be carefully reviewed as part of the 3-D conformal treatment planning process. To accomplish this initial treatment, 4 customized blocks have been designed for this purpose. Each of these 4 complex treatment devices will be used on a daily basis during the initial course of his treatment. It is anticipated that the patient will then receive a boost for an additional 5.4 gray. The anticipated total dose therefore will be 50.4 gray.  Special treatment procedure The patient will receive chemotherapy during the course of radiation treatment. The patient may experience increased or overlapping toxicity due to this combined-modality approach and the patient will be monitored for such problems. This may include extra lab work as necessary. This therefore constitutes a special treatment procedure.    ________________________________  Radene Gunning, MD, PhD

## 2012-03-20 ENCOUNTER — Ambulatory Visit (INDEPENDENT_AMBULATORY_CARE_PROVIDER_SITE_OTHER): Payer: BC Managed Care – PPO | Admitting: General Surgery

## 2012-03-20 ENCOUNTER — Encounter: Payer: Self-pay | Admitting: Radiation Oncology

## 2012-03-20 ENCOUNTER — Encounter (INDEPENDENT_AMBULATORY_CARE_PROVIDER_SITE_OTHER): Payer: Self-pay | Admitting: General Surgery

## 2012-03-20 ENCOUNTER — Ambulatory Visit: Payer: BC Managed Care – PPO | Admitting: Radiation Oncology

## 2012-03-20 ENCOUNTER — Telehealth: Payer: Self-pay | Admitting: *Deleted

## 2012-03-20 ENCOUNTER — Ambulatory Visit
Admission: RE | Admit: 2012-03-20 | Discharge: 2012-03-20 | Disposition: A | Discharge status: Home / Self Care | Payer: BC Managed Care – PPO | Source: Ambulatory Visit | Attending: Radiation Oncology | Admitting: Radiation Oncology

## 2012-03-20 VITALS — BP 154/88 | HR 70 | Temp 97.6°F | Resp 16 | Ht 70.0 in | Wt 192.0 lb

## 2012-03-20 DIAGNOSIS — C20 Malignant neoplasm of rectum: Secondary | ICD-10-CM

## 2012-03-20 NOTE — Telephone Encounter (Signed)
Spoke with patient by phone to touch base and assess for needs or questions.  He stated he has received his Xeloda.  He stated he is doing well and has no questions at the time.  This RN encouraged patient to call for assist.  He verbalized understanding.  Will continue to follow as needed.

## 2012-03-20 NOTE — Progress Notes (Signed)
Chief Complaint  Patient presents with  . Pre-op Exam    eval rectal ca  . Rectal Problems    HISTORY: Jacob [name] is a 41 y.o. male who presents to the office with rectal cancer.  This was found colonoscopy that was performed for some rectal bleeding and abdominal bloating.  Workup thus far has included a rectal [place], which showed a T3N1 tumor.  The remaining portion of his colonoscopy was unremarkable.  He does not know his family history due to being adopted.  He denies any weight loss and has minimal rectal bleeding.  Past Medical History  Diagnosis Date  . Chicken pox as a child    ?  . Hernia 6 months old  . Allergic state 01/19/2012  . Reflux 01/19/2012  . Elevated liver function tests 01/19/2012  . Overweight 01/19/2012  . Rectal bleeding 01/19/2012  . Diarrhea 01/19/2012  . Preventative health care 01/19/2012  . Elevated BP 01/19/2012  . Sun-damaged skin 01/19/2012  . Migraine headache as a child  . Rectal bleeding   . Hx of migraines   . 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 Allergy     PCN  . Cancer 03/06/12    rectal bx=Adenocarcinoma  . GERD (gastroesophageal reflux disease)       Past Surgical History  Procedure Date  . Rectal biopsy 03/06/12    distal mass1-2cm from anal verge=Adenocarcinoma  . Eus 03/14/2012    Procedure: LOWER ENDOSCOPIC ULTRASOUND (EUS);  Surgeon: Rachael Fee, MD;  Location: [facility];  Service: Endoscopy;  Laterality: N/A;  . Hernia repair 6 months old    right inguinal repair      Current Outpatient Prescriptions  Medication Sig Dispense Refill  . capecitabine (XELODA) 500 MG tablet #4 (2000 mg) every am and #3 (1500 mg) every pm = 3500 mg total daily dose by mouth Take on days of radiation only (M-F)  140 tablet  0  . docusate sodium (COLACE) 100 MG capsule Take 100 mg by mouth 2 (two) times daily.      . prochlorperazine (COMPAZINE) 10 MG tablet Take 1 tablet (10 mg total) by mouth every 6 (six) hours as  needed (nausea).  60 tablet  2      Allergies  Allergen Reactions  . Penicillins     Does not recall      Family History  Problem Relation Age of Onset  . Adopted: Yes  . Cancer Father   . Heart disease Father   . Colon cancer Neg Hx       History   Social History  . Marital Status: Married    Spouse Name: N/A    Number of Children: 1  . Years of Education: N/A   Occupational History  . Scultor/Professor     [employer]   Social History Main Topics  . Smoking status: Never Smoker   . Smokeless tobacco: Never Used  . Alcohol Use: No     Comment: occasionally/ not as of now 03/20/12  . Drug Use: No     Comment: marijuana  . Sexually Active: Yes -- Male partner(s)   Other Topics Concern  . None   Social History Narrative   Married to wife, Cecille Amsterdam one 39 year old childOccupation: Teaches metal sculpture at [employer]       REVIEW OF SYSTEMS - PERTINENT POSITIVES ONLY: Review of Systems - General ROS: negative for - chills or fever Allergy and Immunology ROS: negative for -  hives Hematological and Lymphatic ROS: negative for - bleeding problems, blood clots or jaundice Respiratory ROS: no cough, shortness of breath, or wheezing Cardiovascular ROS: no chest pain or dyspnea on exertion Gastrointestinal ROS: positive for - appetite loss, blood in stools and change in bowel habits negative for - nausea/vomiting Genito-Urinary ROS: no dysuria, trouble voiding, or hematuria  EXAM: Filed Vitals:   03/20/12 0958  BP: 154/88  Pulse: 70  Temp: 97.6 F (36.4 C)  Resp: 16     Gen:  No acute distress.  Well nourished and well groomed.   Neurological: Alert and oriented to person, place, and time. Coordination normal.  Head: Normocephalic and atraumatic.  Eyes: Conjunctivae are normal. Pupils are equal, round, and reactive to light. No scleral icterus.  Neck: Normal range of motion. Neck supple. No tracheal deviation or thyromegaly present.  No cervical  lymphadenopathy. Cardiovascular: Normal rate, regular rhythm, normal heart sounds and intact distal pulses.  Exam reveals no gallop and no friction rub.  No murmur heard. Respiratory: Effort normal.  No respiratory distress. No chest wall tenderness. Breath sounds normal.  No wheezes, rales or rhonchi.  GI: Soft. Bowel sounds are normal. The abdomen is soft and nontender.  There is no rebound and no guarding.  Musculoskeletal: Normal range of motion. Extremities are nontender.  Skin: Skin is warm and dry. No rash noted. No diaphoresis. No erythema. No pallor. No clubbing, cyanosis, or edema.   Psychiatric: Normal mood and affect. Behavior is normal. Judgment and thought content normal.   Anorectal Exam: left anterior mass, ~1-2 cm from sphincter complex.   LABORATORY RESULTS: Available labs are reviewed  Lab Results  Component Value Date   CEA 5.7* 03/06/2012   Lab Results  Component Value Date   ALT 128* 03/03/2012   AST 51* 03/03/2012   ALKPHOS 49 03/03/2012   BILITOT 0.5 03/03/2012   Lab Results  Component Value Date   WBC 5.1 01/25/2012   HGB 15.0 01/25/2012   HCT 45.3 01/25/2012   MCV 89.8 01/25/2012   PLT 205.0 01/25/2012    RADIOLOGY RESULTS:   Images and reports are reviewed. CT Chest IMPRESSION:  1. No evidence of thoracic metastatic disease or acute chest process.  2. Stable small low density hepatic lesion.   CT ABD IMPRESSION:  1. Posterior rectal wall mural thickening with presacral fat stranding. This may be inflammatory or neoplastic. No pelvic or inguinal adenopathy in this patient with suspected rectal cancer No evidence of distant metastatic disease.  2. Tiny low density lesion in the right hepatic lobe measuring 8 mm is too small to characterize. Attention on follow-up is recommended.  EUS: T3N1    ASSESSMENT AND PLAN: Jacob [name] is a 41 y.o. M who has a low rectal cancer.  He is T3N1 by EUS and is currently starting Chemo/RT.  His tumor is very low and  I have told him that we will see how it responds to therapy before deciding on what kind of surgery we need to do.  If he responds well, I think he would be an excellent candidate for a low resection and coloanal anastomosis.  I will see him back in 2 months once his treatment is complete.  Given his young age and lack of knowledge about his family history, I have referred him to genetics counseling as well.       Vanita Panda, MD Colon and Rectal Surgery / General Surgery [facility]  Visit Diagnoses: 1. Rectal cancer     Primary Care Physician: Danise Edge, MD

## 2012-03-20 NOTE — Progress Notes (Signed)
03/17/12 1:04pm  Called [employer] and spoke to [place] - found that patient's policy is ending 03/21/12 to change to a different deductible/coinsurance.  For 2014, patient will have a $700 deductible and 20% coinsurance with $3210 OOP.  Patient still has remaining $1177.75 for 2013 so could potentially have a total of about $4,400 for costs of radiation treatment.

## 2012-03-20 NOTE — Patient Instructions (Signed)
Colorectal Cancer  Colorectal cancer is the second most common cancer in the [place], striking 140,000 people annually and causing 60,000 deaths. That's a staggering figure when you consider the disease is potentially curable if diagnosed in the early stages. Who is at risk? Though colorectal cancer may occur at any age, more than 90% of the patients are over age 40, at which point the risk doubles every ten years. In addition to age, other high risk factors include a family history of colorectal cancer and polyps and a personal history of ulcerative colitis, colon polyps or cancer of other organs, especially of the breast or uterus. How does it start? It is generally agreed that nearly all colon and rectal cancer begins in benign polyps. These pre-malignant growths occur on the bowel wall and may eventually increase in size and become cancer. Removal of benign polyps is one aspect of preventive medicine that really works! What are the symptoms? The most common symptoms are rectal bleeding and changes in bowel habits, such as constipation or diarrhea. (These symptoms are also common in other diseases so it is important you receive a thorough examination should you experience them.) Abdominal pain and weight loss are usually late symptoms indicating possible extensive disease. Unfortunately, many polyps and early cancers fail to produce symptoms. Therefore, it is important that your routine physical includes colorectal cancer detection procedures once you reach age 50.  There are several methods for detection of colorectal cancer. These include digital rectal examination, a chemical test of the stool for blood, flexible sigmoidoscopy and colonoscopy (lighted tubular instruments used to inspect the lower bowel) and barium enema. Be sure to discuss these options with your surgeon to determine which procedure is best for you. Individuals who have a first-degree relative (parent or sibling) with colon  cancer or polyps should start their colon cancer screening at the age of 40. How is colorectal cancer treated? Colorectal cancer requires surgery in nearly all cases for complete cure. Radiation and chemotherapy are sometimes used in addition to surgery. Between 80-90% are restored to normal health if the cancer is detected and treated in the earliest stages. The cure rate drops to 50% or less when diagnosed in the later stages. Thanks to modern technology, less than 5% of all colorectal cancer patients require a colostomy, the surgical construction of an artificial excretory opening from the colon. Can colon cancer be prevented? Colon cancer is very preventable. The most important step towards preventing colon cancer is getting a screening test. Any abnormal screening test should be followed by a colonoscopy. Some individuals prefer to start with colonoscopy as a screening test. Colonoscopy provides a detailed examination of the bowel. Polyps can be identified and can often be removed during colonoscopy. Though not definitely proven, there is some evidence that diet may play a significant role in preventing colorectal cancer. As far as we know, a high fiber, low fat diet is the only dietary measure that might help prevent colorectal cancer. Finally, pay attention to changes in your bowel habits. Any new changes such as persistent constipation, diarrhea, or blood in the stool should be discussed with your physician.   Can hemorrhoids lead to colon cancer? No, but hemorrhoids may produce symptoms similar to colon polyps or cancer. Should you experience these symptoms, you should have them examined and evaluated by a physician, preferably by a colon and rectal surgeon. What is a colon and rectal surgeon? Colon and rectal surgeons are experts in the surgical and non-surgical treatment   of diseases of the colon, rectum and anus. They have completed advanced surgical training in the treatment of these  diseases as well as full general surgical training. Board-certified colon and rectal surgeons complete residencies in general surgery and colon and rectal surgery, and pass intensive examinations conducted by the American Board of Surgery and the American Board of Colon and Rectal Surgery. They are well-versed in the treatment of both benign and malignant diseases of the colon, rectum and anus and are able to perform routine screening examinations and surgically treat conditions if indicated to do so.  2012 American Society of Colon & Rectal Surgeons  

## 2012-03-21 ENCOUNTER — Ambulatory Visit: Payer: BC Managed Care – PPO

## 2012-03-21 ENCOUNTER — Ambulatory Visit
Admission: RE | Admit: 2012-03-21 | Discharge: 2012-03-21 | Disposition: A | Discharge status: Home / Self Care | Payer: BC Managed Care – PPO | Source: Ambulatory Visit | Attending: Radiation Oncology | Admitting: Radiation Oncology

## 2012-03-23 ENCOUNTER — Ambulatory Visit
Admission: RE | Admit: 2012-03-23 | Discharge: 2012-03-23 | Disposition: A | Discharge status: Home / Self Care | Payer: BC Managed Care – PPO | Source: Ambulatory Visit | Attending: Radiation Oncology | Admitting: Radiation Oncology

## 2012-03-23 ENCOUNTER — Ambulatory Visit: Payer: BC Managed Care – PPO

## 2012-03-23 ENCOUNTER — Other Ambulatory Visit: Payer: Self-pay | Admitting: *Deleted

## 2012-03-23 ENCOUNTER — Telehealth: Payer: Self-pay | Admitting: Radiation Oncology

## 2012-03-23 NOTE — Telephone Encounter (Signed)
Vicie Mutters, RN phoned this writer because the patient phoned her requesting his radiation treatment time for 03-31-2012 be scheduled closer to his appointment at 3:15 with medical oncology. Phoned Heather, RT on L2 with this request. Herbert Seta confirmed she would discuss this with the patient when he came in tomorrow for treatment.

## 2012-03-24 ENCOUNTER — Ambulatory Visit
Admission: RE | Admit: 2012-03-24 | Discharge: 2012-03-24 | Disposition: A | Discharge status: Home / Self Care | Payer: BC Managed Care – PPO | Source: Ambulatory Visit | Attending: Radiation Oncology | Admitting: Radiation Oncology

## 2012-03-24 ENCOUNTER — Ambulatory Visit: Payer: BC Managed Care – PPO

## 2012-03-24 ENCOUNTER — Encounter: Payer: Self-pay | Admitting: Radiation Oncology

## 2012-03-24 VITALS — BP 123/77 | HR 82 | Temp 97.6°F | Resp 20 | Ht 70.0 in | Wt 190.3 lb

## 2012-03-24 DIAGNOSIS — C20 Malignant neoplasm of rectum: Secondary | ICD-10-CM

## 2012-03-24 NOTE — Progress Notes (Signed)
Patient here weekly rad tx rectum 4/25 completed, alert,oriented x3,patient education done,radiation therapy and you book given along with sitz bath and instructions of use, teach back given by patient, no c/o pain, has some fatigue, had diarrhea on Tuesday resolved on its on has imodium at home, takes xeloda po daily MOn-Fri,can call for any questions concerns,gave my business card  9:18 AM

## 2012-03-24 NOTE — Progress Notes (Signed)
Weekly Management Note Current Dose:7.2 Gy  Projected Dose:50.4 Gy   Narrative:  The patient presents for routine under treatment assessment.  CBCT/MVCT images/Port film x-rays were reviewed.  The chart was checked. Doing well. No complaints.   Physical Findings:  Unchanged  Vitals:  Filed Vitals:   03/24/12 0911  BP: 123/77  Pulse: 82  Temp: 97.6 F (36.4 C)  Resp: 20   Weight:  Wt Readings from Last 3 Encounters:  03/24/12 190 lb 4.8 oz (86.32 kg)  03/20/12 192 lb (87.091 kg)  03/09/12 189 lb 14.4 oz (86.138 kg)   Lab Results  Component Value Date   WBC 5.1 01/25/2012   HGB 15.0 01/25/2012   HCT 45.3 01/25/2012   MCV 89.8 01/25/2012   PLT 205.0 01/25/2012   Lab Results  Component Value Date   CREATININE 0.9 01/25/2012   BUN 19 01/25/2012   NA 139 01/25/2012   K 3.5 01/25/2012   CL 106 01/25/2012   CO2 24 01/25/2012     Impression:  The patient is tolerating radiation.  Plan:  Continue treatment as planned. RN education performed.

## 2012-03-27 ENCOUNTER — Ambulatory Visit: Payer: BC Managed Care – PPO

## 2012-03-27 ENCOUNTER — Ambulatory Visit
Admission: RE | Admit: 2012-03-27 | Discharge: 2012-03-27 | Disposition: A | Discharge status: Home / Self Care | Payer: BC Managed Care – PPO | Source: Ambulatory Visit | Attending: Radiation Oncology | Admitting: Radiation Oncology

## 2012-03-27 ENCOUNTER — Telehealth: Payer: Self-pay | Admitting: Oncology

## 2012-03-27 NOTE — Telephone Encounter (Signed)
LMONVM ADVIISNG THE PT OF HIS LAB APPT TIME ON 03/30/2012.

## 2012-03-28 ENCOUNTER — Ambulatory Visit
Admission: RE | Admit: 2012-03-28 | Discharge: 2012-03-28 | Disposition: A | Discharge status: Home / Self Care | Payer: BC Managed Care – PPO | Source: Ambulatory Visit | Attending: Radiation Oncology | Admitting: Radiation Oncology

## 2012-03-28 ENCOUNTER — Telehealth: Payer: Self-pay | Admitting: Radiation Oncology

## 2012-03-28 ENCOUNTER — Ambulatory Visit: Payer: BC Managed Care – PPO

## 2012-03-28 NOTE — Telephone Encounter (Signed)
Pt here today to inquire about financial assistance and was given an EPP, MCD and ACS application to complete and return. Pt has a household of 3. He works at [employer] and his spouse works pt.  1/7/2014[place] disability benefit forms rcvd from med onc (Darlena), for completion by a rad onc doc.  04/05/2011: I gave pt his mother's FMLS [place] forms today.  **Copy Scanned**

## 2012-03-29 ENCOUNTER — Ambulatory Visit
Admission: RE | Admit: 2012-03-29 | Discharge: 2012-03-29 | Disposition: A | Discharge status: Home / Self Care | Payer: BC Managed Care – PPO | Source: Ambulatory Visit | Attending: Radiation Oncology | Admitting: Radiation Oncology

## 2012-03-29 ENCOUNTER — Ambulatory Visit: Payer: BC Managed Care – PPO

## 2012-03-30 ENCOUNTER — Ambulatory Visit: Payer: BC Managed Care – PPO

## 2012-03-30 ENCOUNTER — Telehealth: Payer: Self-pay | Admitting: *Deleted

## 2012-03-30 ENCOUNTER — Telehealth: Payer: Self-pay | Admitting: Genetic Counselor

## 2012-03-30 ENCOUNTER — Ambulatory Visit
Admission: RE | Admit: 2012-03-30 | Discharge: 2012-03-30 | Disposition: A | Discharge status: Home / Self Care | Payer: BC Managed Care – PPO | Source: Ambulatory Visit | Attending: Radiation Oncology | Admitting: Radiation Oncology

## 2012-03-30 ENCOUNTER — Other Ambulatory Visit (HOSPITAL_BASED_OUTPATIENT_CLINIC_OR_DEPARTMENT_OTHER): Payer: BC Managed Care – PPO

## 2012-03-30 ENCOUNTER — Ambulatory Visit: Payer: BC Managed Care – PPO | Admitting: Oncology

## 2012-03-30 DIAGNOSIS — C20 Malignant neoplasm of rectum: Secondary | ICD-10-CM

## 2012-03-30 LAB — CBC WITH DIFFERENTIAL/PLATELET
BASO%: 0.3 % (ref 0.0–2.0)
EOS%: 1.7 % (ref 0.0–7.0)
HCT: 42.5 % (ref 38.4–49.9)
LYMPH%: 14.9 % (ref 14.0–49.0)
MCH: 31 pg (ref 27.2–33.4)
MCHC: 34.6 g/dL (ref 32.0–36.0)
NEUT%: 67.7 % (ref 39.0–75.0)
lymph#: 0.4 10*3/uL — ABNORMAL LOW (ref 0.9–3.3)

## 2012-03-30 NOTE — Telephone Encounter (Signed)
Call from pt to follow up on his mother's FMLA papers. Spoke with Delice Bison in RadOnc. Forms were given to MD on 03/28/12. They require a 10 day turn around. Made Mrs. Buick aware.

## 2012-03-30 NOTE — Telephone Encounter (Signed)
S/W pt in re Genetic Appt 03/06 @ 1 w/[employer]. Calendar mailed.

## 2012-03-31 ENCOUNTER — Ambulatory Visit (HOSPITAL_BASED_OUTPATIENT_CLINIC_OR_DEPARTMENT_OTHER): Payer: BC Managed Care – PPO | Admitting: Nurse Practitioner

## 2012-03-31 ENCOUNTER — Encounter: Payer: Self-pay | Admitting: Radiation Oncology

## 2012-03-31 ENCOUNTER — Ambulatory Visit
Admission: RE | Admit: 2012-03-31 | Discharge: 2012-03-31 | Disposition: A | Discharge status: Home / Self Care | Payer: BC Managed Care – PPO | Source: Ambulatory Visit | Attending: Radiation Oncology | Admitting: Radiation Oncology

## 2012-03-31 ENCOUNTER — Ambulatory Visit: Payer: BC Managed Care – PPO

## 2012-03-31 ENCOUNTER — Telehealth: Payer: Self-pay | Admitting: Oncology

## 2012-03-31 VITALS — BP 140/85 | HR 97 | Temp 98.6°F | Resp 20 | Ht 70.0 in | Wt 189.1 lb

## 2012-03-31 VITALS — BP 140/85 | HR 97 | Temp 98.6°F | Resp 20 | Wt 189.7 lb

## 2012-03-31 DIAGNOSIS — K625 Hemorrhage of anus and rectum: Secondary | ICD-10-CM

## 2012-03-31 DIAGNOSIS — C20 Malignant neoplasm of rectum: Secondary | ICD-10-CM

## 2012-03-31 DIAGNOSIS — K7689 Other specified diseases of liver: Secondary | ICD-10-CM

## 2012-03-31 NOTE — Progress Notes (Signed)
   Department of Radiation Oncology  Phone:  [phone] Fax:        [phone]  Weekly Treatment Note    Name: Jacob [name] Date: 03/31/2012 MRN: [id number] DOB: 1970-07-10   Current dose: 16.2 Gy  Current fraction: 9   MEDICATIONS: Current Outpatient Prescriptions  Medication Sig Dispense Refill  . capecitabine (XELODA) 500 MG tablet #4 (2000 mg) every am and #3 (1500 mg) every pm = 3500 mg total daily dose by mouth Take on days of radiation only (M-F)  140 tablet  0  . docusate sodium (COLACE) 100 MG capsule Take 100 mg by mouth 2 (two) times daily.      . prochlorperazine (COMPAZINE) 10 MG tablet Take 1 tablet (10 mg total) by mouth every 6 (six) hours as needed (nausea).  60 tablet  2     ALLERGIES: Penicillins   LABORATORY DATA:  Lab Results  Component Value Date   WBC 2.5* 03/30/2012   HGB 14.7 03/30/2012   HCT 42.5 03/30/2012   MCV 89.7 03/30/2012   PLT 168 03/30/2012   Lab Results  Component Value Date   NA 139 01/25/2012   K 3.5 01/25/2012   CL 106 01/25/2012   CO2 24 01/25/2012   Lab Results  Component Value Date   ALT 128* 03/03/2012   AST 51* 03/03/2012   ALKPHOS 49 03/03/2012   BILITOT 0.5 03/03/2012     NARRATIVE: Jacob [name] was seen today for weekly treatment management. The chart was checked and the patient's films were reviewed. The patient is doing well with his treatment so far. He has had a little bit of nausea. He does have some medicine which works well for this.  PHYSICAL EXAMINATION: weight is 189 lb 11.2 oz (86.047 kg). His temperature is 98.6 F (37 C). His blood pressure is 140/85 and his pulse is 97. His respiration is 20.        ASSESSMENT: The patient is doing satisfactorily with treatment.  PLAN: We will continue with the patient's radiation treatment as planned.

## 2012-03-31 NOTE — Progress Notes (Signed)
OFFICE PROGRESS NOTE  Interval history:  Jacob [name] began radiation and Xeloda on 03/20/2012. He is having occasional mild nausea. He takes Compazine as needed. He has had loose stools on 2 occasions. He continues to have intermittent rectal bleeding. He denies hand or foot pain or redness. He had a few mouth sores which have resolved. He notes his appetite is better this week.   Objective: Blood pressure 140/85, pulse 97, temperature 98.6 F (37 C), temperature source Oral, resp. rate 20, height 5\' 10"  (1.778 m), weight 189 lb 1.6 oz (85.775 kg).  Oropharynx is without thrush or ulceration. Lungs are clear. Regular cardiac rhythm. Abdomen is soft and nontender. No hepatomegaly. Extremities are without edema. Palms are without erythema.  Lab Results: Lab Results  Component Value Date   WBC 2.5* 03/30/2012   HGB 14.7 03/30/2012   HCT 42.5 03/30/2012   MCV 89.7 03/30/2012   PLT 168 03/30/2012    Chemistry:    Chemistry      Component Value Date/Time   NA 139 01/25/2012 1003   K 3.5 01/25/2012 1003   CL 106 01/25/2012 1003   CO2 24 01/25/2012 1003   BUN 19 01/25/2012 1003   CREATININE 0.9 01/25/2012 1003      Component Value Date/Time   CALCIUM 9.2 01/25/2012 1003   ALKPHOS 49 03/03/2012 1222   AST 51* 03/03/2012 1222   ALT 128* 03/03/2012 1222   BILITOT 0.5 03/03/2012 1222       Studies/Results: Ct Chest W Contrast  03/13/2012  *RADIOLOGY REPORT*  Clinical Data: New diagnosis of rectal cancer.  CT CHEST WITH CONTRAST  Technique:  Multidetector CT imaging of the chest was performed following the standard protocol during bolus administration of intravenous contrast.  Contrast: 80mL OMNIPAQUE IOHEXOL 300 MG/ML  SOLN  Comparison: Abdominal pelvic CT 03/06/2012.  Findings: There are no enlarged mediastinal or hilar lymph nodes. A small amount of fluid is present within the superior pericardial recess.  There is no significant pleural or pericardial effusion.  The lungs are clear.  There is no  suspicious pulmonary nodule, airspace disease or endobronchial lesion.  The visualized upper abdomen has a stable appearance with a 7 mm low density lesion posteriorly in the right hepatic lobe on image number 60.  There is no adrenal mass.  There are no worrisome osseous lesions.  IMPRESSION:  1.  No evidence of thoracic metastatic disease or acute chest process. 2.  Stable small low density hepatic lesion.   Original Report Authenticated By: Carey Bullocks, M.D.    Ct Abdomen Pelvis W Contrast  03/06/2012  *RADIOLOGY REPORT*  Clinical Data: Rectal malignancy.  Increased bowel movements with bloody stool for 1 year.  Abnormal colonoscopy.  CT ABDOMEN AND PELVIS WITH CONTRAST  Technique:  Multidetector CT imaging of the abdomen and pelvis was performed following the standard protocol during bolus administration of intravenous contrast.  Contrast: OMNIPAQUE IOHEXOL 300 MG/ML  SOLN  Comparison: None.  Findings: Lung Bases: Dependent atelectasis.  Liver:  8 mm low density lesion is present posterior right hepatic lobe (image number 23 series 2).  This tiny lesion is nonspecific. Statistically this likely represents a cyst but is too small to definitively characterize.  Attention on follow-up is recommended.  Spleen:  Normal.  Gallbladder:  Normal.  Common bile duct:  Normal.  Pancreas:  Normal.  Adrenal glands:  Normal.  Kidneys:  Normal enhancement.  Normal delayed excretion of contrast.  Both ureters appear within normal limits.  Stomach:  Normal.  Small bowel:  No obstruction.  No inflammatory changes.  Small bowel mesentery appears within normal limits.  Colon:   Normal decompressed appendix in the right lower quadrant. Ascending colon appears within normal limits.  Transverse and descending colon appear normal.  Sigmoid colon is decompressed. Mild sigmoid diverticulosis without diverticulitis.  Pararectal fat stranding is present extending from the dorsal rectum to the anterior sacrum.  Tiny lymph nodes  are present in the pararectal fat.  There is no inguinal adenopathy.  On the sagittal images, there is mural thickening of the posterior aspect of the rectum which may be inflammatory or neoplastic.  Pelvic Genitourinary:  Prostatomegaly.  Prostate calcifications. Urinary bladder appears normal.  No free fluid.  No iliac or pelvic sidewall adenopathy.  Bones:  No aggressive osseous lesions.  No osteolysis of the sacrum or coccyx to suggest regional invasion.  Mild right hip osteoarthritis with marginal osteophytes and subchondral cysts in the acetabular roof.  Vasculature: Normal.  IMPRESSION: 1. Posterior rectal wall mural thickening with presacral fat stranding.  This may be inflammatory or neoplastic. No pelvic or inguinal adenopathy in this patient with suspected rectal cancer No evidence of distant metastatic disease. 2.  Tiny low density lesion in the right hepatic lobe measuring 8 mm is too small to characterize.  Attention on follow-up is recommended.   Original Report Authenticated By: Andreas Newport, M.D.     Medications: I have reviewed the patient's current medications.  Assessment/Plan:  1. Rectal cancer. Partially obstructing mass noted 1-2 cm from the anal verge on a colonoscopy 03/06/2012. Endoscopic ultrasound 03/14/2012 with a 7.5 mm thick, 3.2 cm wide hypoechoic, irregularly bordered mass that clearly passed into and through the muscularis propria layer of the distal rectal wall (uT3); 3 small (largest 7 mm) perirectal lymph nodes. The lymph nodes were all round, discrete, hypoechoic, homogenous; suspicious for malignant involvement (uN1). He began radiation and concurrent Xeloda chemotherapy on 03/20/2012.  2. Irregular bowel habits/rectal bleeding secondary to #1. 3. Mild elevation of the liver enzymes. Question secondary to hepatic steatosis. 4. Indeterminate 8 mm posterior right liver lesion on the staging CT 03/06/2012. 5. Mildly elevated CEA at 5.7 on 03/06/2012.  Lonna Cobb  ANP/GNP-BC

## 2012-03-31 NOTE — Telephone Encounter (Signed)
gv and printed appt schedule for pt for Jan..the patient aware

## 2012-03-31 NOTE — Progress Notes (Signed)
Patient here for routine weekly assessment of radiation treatment to rectal area.Has completed 9 of 25 treatments.Denies pain.Stools regular and vary in consistency  soft to diarrhea.Occasional nausea in mornings.Has rectal sensitivity. Noticed some fatigue but not as bad as last week.Tolerating xeloda so far with minimum side effects.

## 2012-04-03 ENCOUNTER — Ambulatory Visit
Admission: RE | Admit: 2012-04-03 | Discharge: 2012-04-03 | Disposition: A | Discharge status: Home / Self Care | Payer: BC Managed Care – PPO | Source: Ambulatory Visit | Attending: Radiation Oncology | Admitting: Radiation Oncology

## 2012-04-03 ENCOUNTER — Ambulatory Visit: Payer: BC Managed Care – PPO

## 2012-04-04 ENCOUNTER — Ambulatory Visit: Payer: BC Managed Care – PPO

## 2012-04-04 ENCOUNTER — Telehealth: Payer: Self-pay | Admitting: *Deleted

## 2012-04-04 ENCOUNTER — Ambulatory Visit
Admission: RE | Admit: 2012-04-04 | Discharge: 2012-04-04 | Disposition: A | Discharge status: Home / Self Care | Payer: BC Managed Care – PPO | Source: Ambulatory Visit | Attending: Radiation Oncology | Admitting: Radiation Oncology

## 2012-04-04 NOTE — Telephone Encounter (Signed)
Received FMLA forms from patient's mother.  The forms were given to [facility], in [place], who stated the provider has to complete.  Forms were left with Dr. Truett Perna.

## 2012-04-05 ENCOUNTER — Ambulatory Visit: Payer: BC Managed Care – PPO

## 2012-04-05 ENCOUNTER — Telehealth: Payer: Self-pay | Admitting: Radiation Oncology

## 2012-04-05 ENCOUNTER — Ambulatory Visit
Admission: RE | Admit: 2012-04-05 | Discharge: 2012-04-05 | Disposition: A | Discharge status: Home / Self Care | Payer: BC Managed Care – PPO | Source: Ambulatory Visit | Attending: Radiation Oncology | Admitting: Radiation Oncology

## 2012-04-05 NOTE — Telephone Encounter (Signed)
INDIGENT DENIED FAMILY SIZE: 3 HH INC: 43391.06 MOD POV: 39060.00 VALID DATES: 04/02/2012 - 09/30/2012 OVER QUALIFIED

## 2012-04-06 ENCOUNTER — Other Ambulatory Visit (HOSPITAL_BASED_OUTPATIENT_CLINIC_OR_DEPARTMENT_OTHER): Payer: BC Managed Care – PPO

## 2012-04-06 ENCOUNTER — Telehealth: Payer: Self-pay | Admitting: *Deleted

## 2012-04-06 ENCOUNTER — Ambulatory Visit
Admission: RE | Admit: 2012-04-06 | Discharge: 2012-04-06 | Disposition: A | Discharge status: Home / Self Care | Payer: BC Managed Care – PPO | Source: Ambulatory Visit | Attending: Radiation Oncology | Admitting: Radiation Oncology

## 2012-04-06 ENCOUNTER — Ambulatory Visit: Payer: BC Managed Care – PPO

## 2012-04-06 DIAGNOSIS — C20 Malignant neoplasm of rectum: Secondary | ICD-10-CM

## 2012-04-06 LAB — CBC WITH DIFFERENTIAL/PLATELET
BASO%: 0.2 % (ref 0.0–2.0)
EOS%: 1.5 % (ref 0.0–7.0)
HGB: 14.3 g/dL (ref 13.0–17.1)
MCH: 30.8 pg (ref 27.2–33.4)
MCHC: 35.1 g/dL (ref 32.0–36.0)
RDW: 13.2 % (ref 11.0–14.6)
lymph#: 0.4 10*3/uL — ABNORMAL LOW (ref 0.9–3.3)

## 2012-04-06 NOTE — Telephone Encounter (Signed)
Spoke with patient's mother to notify her that FMLA forms were signed by MD and ready for pick-up.  Forms were left at front desk file box.

## 2012-04-07 ENCOUNTER — Ambulatory Visit
Admission: RE | Admit: 2012-04-07 | Discharge: 2012-04-07 | Disposition: A | Discharge status: Home / Self Care | Payer: BC Managed Care – PPO | Source: Ambulatory Visit | Attending: Radiation Oncology | Admitting: Radiation Oncology

## 2012-04-07 ENCOUNTER — Ambulatory Visit: Payer: BC Managed Care – PPO

## 2012-04-07 ENCOUNTER — Encounter: Payer: Self-pay | Admitting: Radiation Oncology

## 2012-04-07 VITALS — BP 140/79 | HR 87 | Temp 98.2°F | Wt 187.6 lb

## 2012-04-07 DIAGNOSIS — C801 Malignant (primary) neoplasm, unspecified: Secondary | ICD-10-CM

## 2012-04-07 DIAGNOSIS — C20 Malignant neoplasm of rectum: Secondary | ICD-10-CM

## 2012-04-07 MED ORDER — BIAFINE EX EMUL
CUTANEOUS | Status: DC | PRN
  Administered 2012-04-07: 1 via TOPICAL

## 2012-04-07 NOTE — Addendum Note (Signed)
Encounter addended by: Tessa Lerner, RN on: 04/07/2012 11:03 AM<BR>     Documentation filed: Visit Diagnoses, Orders

## 2012-04-07 NOTE — Addendum Note (Signed)
Encounter addended by: Tessa Lerner, RN on: 04/07/2012 11:12 AM<BR>     Documentation filed: Inpatient MAR

## 2012-04-07 NOTE — Progress Notes (Signed)
Patient here for routine weekly assessment of radiation of rectum.Has completed 14 of 25.Intermittent nausea which doesn't last very long.Denies pain.Appetite all right.Intermittent constipation and diarrhea.Takes stool softeners once to twice daily depending on how bowels move.

## 2012-04-07 NOTE — Progress Notes (Signed)
  Radiation Oncology         [phone] ________________________________  Name: Jacob [name] MRN: [id number]  Date: 04/07/2012  DOB: 1970/09/02  Weekly Radiation Therapy Management  Current Dose: 25.2 Gy     Planned Dose:  45 Gy  Narrative . . . . . . . . The patient presents for routine under treatment assessment.                                            Patient here for routine weekly assessment of radiation of rectum.Has completed 14 of 25.Intermittent nausea which doesn't last very long.Denies pain.Appetite all right.Intermittent constipation and diarrhea.Takes stool softeners once to twice daily depending on how bowels move                                 Set-up films were reviewed.                                 The chart was checked. Physical Findings. . .  weight is 187 lb 9.6 oz (85.095 kg). His temperature is 98.2 F (36.8 C). His blood pressure is 140/79 and his pulse is 87. His oxygen saturation is 97%. . Weight essentially stable.  No significant changes. Impression . . . . . . . The patient is  tolerating radiation. Plan . . . . . . . . . . . . Continue treatment as planned.  ________________________________  Artist Pais. Kathrynn Running, M.D.

## 2012-04-10 ENCOUNTER — Ambulatory Visit: Payer: BC Managed Care – PPO

## 2012-04-10 ENCOUNTER — Ambulatory Visit
Admission: RE | Admit: 2012-04-10 | Discharge: 2012-04-10 | Disposition: A | Discharge status: Home / Self Care | Payer: BC Managed Care – PPO | Source: Ambulatory Visit | Attending: Radiation Oncology | Admitting: Radiation Oncology

## 2012-04-11 ENCOUNTER — Other Ambulatory Visit: Payer: Self-pay | Admitting: *Deleted

## 2012-04-11 ENCOUNTER — Ambulatory Visit: Payer: BC Managed Care – PPO

## 2012-04-11 ENCOUNTER — Ambulatory Visit
Admission: RE | Admit: 2012-04-11 | Discharge: 2012-04-11 | Disposition: A | Discharge status: Home / Self Care | Payer: BC Managed Care – PPO | Source: Ambulatory Visit | Attending: Radiation Oncology | Admitting: Radiation Oncology

## 2012-04-11 DIAGNOSIS — C20 Malignant neoplasm of rectum: Secondary | ICD-10-CM

## 2012-04-11 MED ORDER — CAPECITABINE 500 MG PO TABS: ORAL_TABLET | ORAL | Status: DC

## 2012-04-11 NOTE — Telephone Encounter (Signed)
Refill faxed to [facility] for #56 Xeloda tablets to complete radiation.

## 2012-04-11 NOTE — Telephone Encounter (Signed)
THIS REFILL REQUEST FOR XELODA WAS GIVEN TO DR.SHERRILL'S NURSE, TANYA WHITLOCK,RN. 

## 2012-04-12 ENCOUNTER — Ambulatory Visit
Admission: RE | Admit: 2012-04-12 | Discharge: 2012-04-12 | Disposition: A | Discharge status: Home / Self Care | Payer: BC Managed Care – PPO | Source: Ambulatory Visit | Attending: Radiation Oncology | Admitting: Radiation Oncology

## 2012-04-12 ENCOUNTER — Ambulatory Visit: Payer: BC Managed Care – PPO

## 2012-04-12 NOTE — Telephone Encounter (Signed)
Rec'd fax confirmation that Rx for Xeloda was rec'd on 04/11/12 and shipping arrangements/insurance verification will be made with patient.

## 2012-04-13 ENCOUNTER — Ambulatory Visit (HOSPITAL_BASED_OUTPATIENT_CLINIC_OR_DEPARTMENT_OTHER)
Admission: RE | Admit: 2012-04-13 | Discharge: 2012-04-13 | Disposition: A | Discharge status: Home / Self Care | Payer: BC Managed Care – PPO | Source: Ambulatory Visit | Attending: Radiation Oncology | Admitting: Radiation Oncology

## 2012-04-13 ENCOUNTER — Ambulatory Visit: Payer: BC Managed Care – PPO

## 2012-04-14 ENCOUNTER — Ambulatory Visit (HOSPITAL_BASED_OUTPATIENT_CLINIC_OR_DEPARTMENT_OTHER): Payer: BC Managed Care – PPO | Admitting: Oncology

## 2012-04-14 ENCOUNTER — Ambulatory Visit: Payer: BC Managed Care – PPO

## 2012-04-14 ENCOUNTER — Other Ambulatory Visit: Payer: BC Managed Care – PPO

## 2012-04-14 ENCOUNTER — Ambulatory Visit
Admission: RE | Admit: 2012-04-14 | Discharge: 2012-04-14 | Disposition: A | Discharge status: Home / Self Care | Payer: BC Managed Care – PPO | Source: Ambulatory Visit | Attending: Radiation Oncology | Admitting: Radiation Oncology

## 2012-04-14 ENCOUNTER — Telehealth: Payer: Self-pay | Admitting: Oncology

## 2012-04-14 VITALS — BP 147/87 | HR 78 | Temp 97.9°F | Wt 193.7 lb

## 2012-04-14 VITALS — Ht 70.0 in | Wt 192.6 lb

## 2012-04-14 DIAGNOSIS — C20 Malignant neoplasm of rectum: Secondary | ICD-10-CM

## 2012-04-14 LAB — COMPREHENSIVE METABOLIC PANEL (CC13)
AST: 40 U/L — ABNORMAL HIGH (ref 5–34)
Albumin: 3.5 g/dL (ref 3.5–5.0)
BUN: 13.8 mg/dL (ref 7.0–26.0)
Calcium: 8.9 mg/dL (ref 8.4–10.4)
Chloride: 107 mEq/L (ref 98–107)
Glucose: 111 mg/dl — ABNORMAL HIGH (ref 70–99)
Potassium: 3.6 mEq/L (ref 3.5–5.1)
Total Protein: 6.5 g/dL (ref 6.4–8.3)

## 2012-04-14 LAB — CBC WITH DIFFERENTIAL/PLATELET
Basophils Absolute: 0 10*3/uL (ref 0.0–0.1)
EOS%: 1.8 % (ref 0.0–7.0)
Eosinophils Absolute: 0.1 10*3/uL (ref 0.0–0.5)
HGB: 14.2 g/dL (ref 13.0–17.1)
NEUT#: 2.3 10*3/uL (ref 1.5–6.5)
RDW: 13.3 % (ref 11.0–14.6)
lymph#: 0.3 10*3/uL — ABNORMAL LOW (ref 0.9–3.3)

## 2012-04-14 NOTE — Progress Notes (Signed)
Met with patient to check in and assess for needs.  He stated he is doing as well as to be expected.  He denied need for assist at this time and acknowledged having a very strong support system at home.  He was given information about the GI support group.  Will continue to follow as needed.

## 2012-04-14 NOTE — Progress Notes (Signed)
   [facility]    OFFICE PROGRESS NOTE   INTERVAL HISTORY:   He returns as scheduled. He continues daily radiation and Xeloda. No mouth sores or hand/foot pain. He has frequent bowel movements, but these have decreased since beginning therapy. He has developed skin irritation at the perineum. He reports discomfort at the posterior left leg after sitting and wonders whether this may represent "sciatica ". No leg swelling. The pain is better with activity.  Objective:  Vital signs in last 24 hours:  Height 5\' 10"  (1.778 m), weight 192 lb 9.6 oz (87.363 kg).    HEENT: No thrush or ulcers Resp: Lungs clear bilaterally Cardio: Regular rate and rhythm GI: No hepatosplenomegaly, nontender, erythema at the perineum/anal verge and upper gluteal fold without skin breakdown Vascular: No leg edema Neuro: The leg strength is intact  Musculoskeletal: No pain with motion at the left hip, no spine tenderness   Lab Results:  Lab Results  Component Value Date   WBC 3.1* 04/14/2012   HGB 14.2 04/14/2012   HCT 41.4 04/14/2012   MCV 90.2 04/14/2012   PLT 151 04/14/2012   ANC 2.3    Medications: I have reviewed the patient's current medications.  Assessment/Plan: 1. Rectal cancer. Partially obstructing mass noted 1-2 cm from the anal verge on a colonoscopy 03/06/2012. Endoscopic ultrasound 03/14/2012 with a 7.5 mm thick, 3.2 cm wide hypoechoic, irregularly bordered mass that clearly passed into and through the muscularis propria layer of the distal rectal wall (uT3); 3 small (largest 7 mm) perirectal lymph nodes. The lymph nodes were all round, discrete, hypoechoic, homogenous; suspicious for malignant involvement (uN1). He began radiation and concurrent Xeloda chemotherapy on 03/20/2012.  2. Irregular bowel habits/rectal bleeding secondary to #1. 3. Mild elevation of the liver enzymes. Question secondary to hepatic steatosis. 4. Indeterminate 8 mm posterior right liver lesion on the  staging CT 03/06/2012. 5. Mildly elevated CEA at 5.7 on 03/06/2012.   Disposition:  He appears to be tolerating the radiation and Xeloda well. He is scheduled to complete radiation on 04/27/2012. He will return for an office visit on 04/27/2012.   Thornton Papas, MD  04/14/2012  12:09 PM

## 2012-04-14 NOTE — Telephone Encounter (Signed)
appts made and printed for pt aom °

## 2012-04-14 NOTE — Progress Notes (Signed)
   Department of Radiation Oncology  Phone:  [phone] Fax:        [phone]  Weekly Treatment Note    Name: Jacob [name] Date: 04/14/2012 MRN: [id number] DOB: 02/25/1971   Current dose: 34.2 Gy  Current fraction: 19   MEDICATIONS: Current Outpatient Prescriptions  Medication Sig Dispense Refill  . capecitabine (XELODA) 500 MG tablet #4 (2000 mg) every am and #3 (1500 mg) every pm = 3500 mg total daily dose by mouth Take on days of radiation only (M-F)  56 tablet  0  . docusate sodium (COLACE) 100 MG capsule Take 100 mg by mouth 2 (two) times daily.      . prochlorperazine (COMPAZINE) 10 MG tablet Take 1 tablet (10 mg total) by mouth every 6 (six) hours as needed (nausea).  60 tablet  2     ALLERGIES: Penicillins   LABORATORY DATA:  Lab Results  Component Value Date   WBC 2.8* 04/06/2012   HGB 14.3 04/06/2012   HCT 40.7 04/06/2012   MCV 87.9 04/06/2012   PLT 149 04/06/2012   Lab Results  Component Value Date   NA 139 01/25/2012   K 3.5 01/25/2012   CL 106 01/25/2012   CO2 24 01/25/2012   Lab Results  Component Value Date   ALT 128* 03/03/2012   AST 51* 03/03/2012   ALKPHOS 49 03/03/2012   BILITOT 0.5 03/03/2012     NARRATIVE: KNIGHT OELKERS was seen today for weekly treatment management. The chart was checked and the patient's films were reviewed. The patient is doing fairly well. He is not having any ongoing problem with diarrhea although he has had some back and forth in terms of some constipation and loose stools. The anal/rectal area is becoming a little bit irritated. He has not begun sitz baths although he does have this available. He is using Biafine cream.  PHYSICAL EXAMINATION: weight is 193 lb 11.2 oz (87.862 kg). His temperature is 97.9 F (36.6 C). His blood pressure is 147/87 and his pulse is 78.        ASSESSMENT: The patient is doing satisfactorily with treatment.  PLAN: We will continue with the patient's radiation treatment as planned.

## 2012-04-14 NOTE — Progress Notes (Signed)
Patient here for weekly assessment of rectal cancer radiation.Rectum more sensitive.Using wipes and applying biafine..Denies nausea or vomiting.Bowels regular as patient takes stool softeners daily.

## 2012-04-17 ENCOUNTER — Ambulatory Visit
Admission: RE | Admit: 2012-04-17 | Discharge: 2012-04-17 | Disposition: A | Discharge status: Home / Self Care | Payer: BC Managed Care – PPO | Source: Ambulatory Visit | Attending: Radiation Oncology | Admitting: Radiation Oncology

## 2012-04-17 ENCOUNTER — Ambulatory Visit: Payer: BC Managed Care – PPO

## 2012-04-17 NOTE — Telephone Encounter (Signed)
RECEIVED A FAX FROM [facility] CONCERNING A CONFIRMATION OF PRESCRIPTION SHIPMENT FOR XELODA ON 04/14/12. 

## 2012-04-18 ENCOUNTER — Ambulatory Visit: Payer: BC Managed Care – PPO

## 2012-04-18 ENCOUNTER — Ambulatory Visit
Admission: RE | Admit: 2012-04-18 | Discharge: 2012-04-18 | Disposition: A | Discharge status: Home / Self Care | Payer: BC Managed Care – PPO | Source: Ambulatory Visit | Attending: Radiation Oncology | Admitting: Radiation Oncology

## 2012-04-19 ENCOUNTER — Telehealth: Payer: Self-pay | Admitting: *Deleted

## 2012-04-19 ENCOUNTER — Ambulatory Visit: Payer: BC Managed Care – PPO

## 2012-04-19 ENCOUNTER — Ambulatory Visit
Admission: RE | Admit: 2012-04-19 | Discharge: 2012-04-19 | Disposition: A | Discharge status: Home / Self Care | Payer: BC Managed Care – PPO | Source: Ambulatory Visit | Attending: Radiation Oncology | Admitting: Radiation Oncology

## 2012-04-19 ENCOUNTER — Encounter: Payer: Self-pay | Admitting: Radiation Oncology

## 2012-04-19 DIAGNOSIS — C20 Malignant neoplasm of rectum: Secondary | ICD-10-CM

## 2012-04-19 MED ORDER — HYDROCODONE-ACETAMINOPHEN 5-325 MG PO TABS: 1.0000 | ORAL_TABLET | Freq: Four times a day (QID) | ORAL | Status: DC | PRN

## 2012-04-19 NOTE — Telephone Encounter (Signed)
Patient calling and leaving message for Vicie Mutters, RN, that he is having increasing pain in rectal area in Radiation field. He is using creams that RT has given him without relief. Called and spoke to patient, pain is internal, anal/rectal area, denies any bleeding and normal bowel movements. Informed patient we would call in some pain meds, but he should be sure and inform Rad Onc of the increase in pain for further assessment. Consulted with Lonna Cobb, NP in Dr Kalman Drape absence, and will make Rad Onc team aware. Instructed patient to make Rad Onc aware with appt tomorrow, and that if pain continues to worsen or he has any temp or obvious signs of blood, he needs to call and report this immediately. Patient verbalized understanding.

## 2012-04-19 NOTE — Telephone Encounter (Signed)
Patient called stating he had internal rectal/anal pain requesting pain medication.  He denied constipation and stated it was an internal throbbing that just started today, not relieved with creams or BM.  Patient stated he had not been prescribed any pain medication previously.  This information was given to Amy M. In triage.

## 2012-04-20 ENCOUNTER — Ambulatory Visit: Payer: BC Managed Care – PPO

## 2012-04-20 ENCOUNTER — Ambulatory Visit
Admission: RE | Admit: 2012-04-20 | Discharge: 2012-04-20 | Disposition: A | Discharge status: Home / Self Care | Payer: BC Managed Care – PPO | Source: Ambulatory Visit | Attending: Radiation Oncology | Admitting: Radiation Oncology

## 2012-04-21 ENCOUNTER — Ambulatory Visit: Payer: BC Managed Care – PPO

## 2012-04-21 ENCOUNTER — Ambulatory Visit
Admission: RE | Admit: 2012-04-21 | Discharge: 2012-04-21 | Disposition: A | Discharge status: Home / Self Care | Payer: BC Managed Care – PPO | Source: Ambulatory Visit | Attending: Radiation Oncology | Admitting: Radiation Oncology

## 2012-04-21 ENCOUNTER — Encounter: Payer: Self-pay | Admitting: Radiation Oncology

## 2012-04-21 VITALS — BP 137/80 | HR 86 | Temp 97.8°F | Resp 20 | Wt 194.7 lb

## 2012-04-21 DIAGNOSIS — C20 Malignant neoplasm of rectum: Secondary | ICD-10-CM

## 2012-04-21 MED ORDER — BIAFINE EX EMUL
Freq: Two times a day (BID) | CUTANEOUS | Status: DC
  Administered 2012-04-21: 13:00:00 via TOPICAL

## 2012-04-21 NOTE — Progress Notes (Signed)
Pt c/o mild pain in rectal area, taking sitz baths, applying Biafine lotion, taking Hydrocodone -APAP q 6hr w/good relief. Pt denies loss of appetite, is fatigued off and on.

## 2012-04-21 NOTE — Progress Notes (Signed)
   Department of Radiation Oncology  Phone:  [phone] Fax:        [phone]  Weekly Treatment Note    Name: [name] Date: 04/21/2012 MRN: [id number] DOB: 1970-11-25   Current dose: 43.2 Gy  Current fraction: 24   MEDICATIONS: Current Outpatient Prescriptions  Medication Sig Dispense Refill  . capecitabine (XELODA) 500 MG tablet #4 (2000 mg) every am and #3 (1500 mg) every pm = 3500 mg total daily dose by mouth Take on days of radiation only (M-F)  56 tablet  0  . docusate sodium (COLACE) 100 MG capsule Take 100 mg by mouth 2 (two) times daily.      Marland Kitchen HYDROcodone-acetaminophen (NORCO/VICODIN) 5-325 MG per tablet Take 1 tablet by mouth every 6 (six) hours as needed for pain.  30 tablet  0  . prochlorperazine (COMPAZINE) 10 MG tablet Take 1 tablet (10 mg total) by mouth every 6 (six) hours as needed (nausea).  60 tablet  2     ALLERGIES: Penicillins   LABORATORY DATA:  Lab Results  Component Value Date   WBC 3.1* 04/14/2012   HGB 14.2 04/14/2012   HCT 41.4 04/14/2012   MCV 90.2 04/14/2012   PLT 151 04/14/2012   Lab Results  Component Value Date   NA 140 04/14/2012   K 3.6 04/14/2012   CL 107 04/14/2012   CO2 23 04/14/2012   Lab Results  Component Value Date   ALT 108* 04/14/2012   AST 40* 04/14/2012   ALKPHOS 56 04/14/2012   BILITOT 0.30 04/14/2012     NARRATIVE: Jacob [name] was seen today for weekly treatment management. The chart was checked and the patient's films were reviewed. The patient is having some irritation in the anal/rectal region. He is using sitz baths and using daily skin cream. He is also using pain medicine with good relief.  PHYSICAL EXAMINATION: weight is 194 lb 11.2 oz (88.315 kg). His oral temperature is 97.8 F (36.6 C). His blood pressure is 137/80 and his pulse is 86. His respiration is 20.        ASSESSMENT: The patient is doing satisfactorily with treatment.  PLAN: We will continue with the patient's radiation treatment as  planned. No changes made and I will see him next week at the end of his course.

## 2012-04-21 NOTE — Addendum Note (Signed)
Encounter addended by: Glennie Hawk, RN on: 04/21/2012 12:36 PM<BR>     Documentation filed: Inpatient MAR, Orders

## 2012-04-24 ENCOUNTER — Ambulatory Visit
Admission: RE | Admit: 2012-04-24 | Discharge: 2012-04-24 | Disposition: A | Discharge status: Home / Self Care | Payer: BC Managed Care – PPO | Source: Ambulatory Visit | Attending: Radiation Oncology | Admitting: Radiation Oncology

## 2012-04-24 ENCOUNTER — Ambulatory Visit: Payer: BC Managed Care – PPO

## 2012-04-25 ENCOUNTER — Ambulatory Visit: Payer: BC Managed Care – PPO

## 2012-04-25 ENCOUNTER — Ambulatory Visit
Admission: RE | Admit: 2012-04-25 | Discharge: 2012-04-25 | Disposition: A | Discharge status: Home / Self Care | Payer: BC Managed Care – PPO | Source: Ambulatory Visit | Attending: Radiation Oncology | Admitting: Radiation Oncology

## 2012-04-25 ENCOUNTER — Other Ambulatory Visit: Payer: Self-pay | Admitting: *Deleted

## 2012-04-25 DIAGNOSIS — C20 Malignant neoplasm of rectum: Secondary | ICD-10-CM

## 2012-04-25 NOTE — Progress Notes (Signed)
  Radiation Oncology         (336) [phone] ________________________________  Name: Jacob [name] MRN: [id number]  Date: 04/25/2012  DOB: 08-01-1970  Simulation Verification Note   NARRATIVE: The patient was brought to the treatment unit and placed in the planned treatment position for his boost treatment. The clinical setup was verified. Then port films were obtained and uploaded to the radiation oncology medical record software.  The treatment beams were carefully compared against the planned radiation fields. The position, location, and shape of the radiation fields was reviewed. The targeted volume of tissue appears to be appropriately covered by the radiation beams. Based on my personal review, I approved the simulation verification. The patient's treatment will proceed as planned.  ________________________________   Radene Gunning, MD, PhD

## 2012-04-25 NOTE — Telephone Encounter (Signed)
THIS REFILL REQUEST WAS GIVEN TO DR.SHERRILL'S NURSE, TANYA WHITLOCK,RN.

## 2012-04-26 ENCOUNTER — Ambulatory Visit: Payer: BC Managed Care – PPO

## 2012-04-26 ENCOUNTER — Ambulatory Visit
Admission: RE | Admit: 2012-04-26 | Discharge: 2012-04-26 | Disposition: A | Discharge status: Home / Self Care | Payer: BC Managed Care – PPO | Source: Ambulatory Visit | Attending: Radiation Oncology | Admitting: Radiation Oncology

## 2012-04-26 ENCOUNTER — Other Ambulatory Visit: Payer: Self-pay | Admitting: *Deleted

## 2012-04-26 NOTE — Telephone Encounter (Signed)
Left VM to confirm with patient that he has enough Xeloda to complete his radiation therapy that looks as if it will be completed on 04/27/12. Refill request faxed to office by [facility].

## 2012-04-27 ENCOUNTER — Ambulatory Visit
Admission: RE | Admit: 2012-04-27 | Payer: BC Managed Care – PPO | Source: Ambulatory Visit | Admitting: Radiation Oncology

## 2012-04-27 ENCOUNTER — Ambulatory Visit
Admission: RE | Admit: 2012-04-27 | Discharge: 2012-04-27 | Disposition: A | Discharge status: Home / Self Care | Payer: BC Managed Care – PPO | Source: Ambulatory Visit | Attending: Radiation Oncology | Admitting: Radiation Oncology

## 2012-04-27 ENCOUNTER — Telehealth: Payer: Self-pay | Admitting: Oncology

## 2012-04-27 ENCOUNTER — Ambulatory Visit: Payer: BC Managed Care – PPO

## 2012-04-27 ENCOUNTER — Encounter: Payer: Self-pay | Admitting: Radiation Oncology

## 2012-04-27 ENCOUNTER — Ambulatory Visit (HOSPITAL_BASED_OUTPATIENT_CLINIC_OR_DEPARTMENT_OTHER): Payer: BC Managed Care – PPO | Admitting: Oncology

## 2012-04-27 ENCOUNTER — Other Ambulatory Visit (HOSPITAL_BASED_OUTPATIENT_CLINIC_OR_DEPARTMENT_OTHER): Payer: BC Managed Care – PPO | Admitting: Lab

## 2012-04-27 VITALS — BP 158/84 | HR 83 | Temp 97.8°F | Resp 20 | Wt 195.4 lb

## 2012-04-27 DIAGNOSIS — K6289 Other specified diseases of anus and rectum: Secondary | ICD-10-CM

## 2012-04-27 DIAGNOSIS — C20 Malignant neoplasm of rectum: Secondary | ICD-10-CM

## 2012-04-27 DIAGNOSIS — R7989 Other specified abnormal findings of blood chemistry: Secondary | ICD-10-CM

## 2012-04-27 DIAGNOSIS — K7689 Other specified diseases of liver: Secondary | ICD-10-CM

## 2012-04-27 LAB — CBC WITH DIFFERENTIAL/PLATELET
BASO%: 0.3 % (ref 0.0–2.0)
Basophils Absolute: 0 10*3/uL (ref 0.0–0.1)
EOS%: 1 % (ref 0.0–7.0)
HGB: 14.1 g/dL (ref 13.0–17.1)
MCH: 30.9 pg (ref 27.2–33.4)
RBC: 4.56 10*6/uL (ref 4.20–5.82)
RDW: 16.9 % — ABNORMAL HIGH (ref 11.0–14.6)
lymph#: 0.2 10*3/uL — ABNORMAL LOW (ref 0.9–3.3)

## 2012-04-27 NOTE — Progress Notes (Signed)
Patient here for final rad tx rectal, 28/28 completed,alert,oriented x3, still using biafine cream for his bottom, stated uses sitz bath daily/prn, still raw, but getting better  Stated patient requesting refill on vicodin, 8:59 AM

## 2012-04-27 NOTE — Progress Notes (Signed)
  Radiation Oncology         (336) [phone] ________________________________  Name: Jacob [name] MRN: [id number]  Date: 04/21/2012  DOB: 11/28/1970  COMPLEX SIMULATION  NOTE  Diagnosis: rectal cancer  Narrative The patient has initially been planned to receive a course of radiation treatment to a dose of 45 gray in 25 fractions at 1.8 gray per fraction. The patient will now receive a boost to the high risk target volume for an additional 5.4 gray. This will be delivered in 3 fractions at 1.8 gray per fraction and a cone down boost technique will be utilized. To accomplish this, an additional 4 customized blocks have been designed for this purpose. A complex isodose plan is requested to ensure that the high-risk target region receives the appropriate radiation dose and that the nearby normal structures continue to be appropriately spared. The patient's final total dose therefore will be 50.4 gray.   ________________________________ ------------------------------------------------  Radene Gunning, MD, PhD

## 2012-04-27 NOTE — Progress Notes (Signed)
   Department of Radiation Oncology  Phone:  [phone] Fax:        [phone]  Weekly Treatment Note    Name: Jacob [name] Date: 04/27/2012 MRN: [id number] DOB: 12-12-1970   Current dose: 50.4 Gy  Current fraction: 28   MEDICATIONS: Current Outpatient Prescriptions  Medication Sig Dispense Refill  . capecitabine (XELODA) 500 MG tablet #4 (2000 mg) every am and #3 (1500 mg) every pm = 3500 mg total daily dose by mouth Take on days of radiation only (M-F)  56 tablet  0  . docusate sodium (COLACE) 100 MG capsule Take 100 mg by mouth 2 (two) times daily.      Marland Kitchen HYDROcodone-acetaminophen (NORCO/VICODIN) 5-325 MG per tablet Take 1 tablet by mouth every 6 (six) hours as needed for pain.  30 tablet  0  . prochlorperazine (COMPAZINE) 10 MG tablet Take 1 tablet (10 mg total) by mouth every 6 (six) hours as needed (nausea).  60 tablet  2     ALLERGIES: Penicillins   LABORATORY DATA:  Lab Results  Component Value Date   WBC 3.4* 04/27/2012   HGB 14.1 04/27/2012   HCT 41.7 04/27/2012   MCV 91.5 04/27/2012   PLT 168 04/27/2012   Lab Results  Component Value Date   NA 140 04/14/2012   K 3.6 04/14/2012   CL 107 04/14/2012   CO2 23 04/14/2012   Lab Results  Component Value Date   ALT 108* 04/14/2012   AST 40* 04/14/2012   ALKPHOS 56 04/14/2012   BILITOT 0.30 04/14/2012     NARRATIVE: Jacob [name] was seen today for weekly treatment management. The chart was checked and the patient's films were reviewed. The patient completed his final fraction today. He has done well. No complaints of substantial GI issues. The patient obtained a Vicodin refill today through medical oncology.  PHYSICAL EXAMINATION: weight is 195 lb 6.4 oz (88.633 kg). His oral temperature is 97.8 F (36.6 C). His blood pressure is 158/84 and his pulse is 83. His respiration is 20.      erythema present in the anal and perianal region. No significant desquamation. Overall her skin looks good considering he finished  treatment today.  ASSESSMENT: The patient did satisfactorily with treatment.  PLAN: Continue skin care with sitz baths. The patient will followup in one month.

## 2012-04-27 NOTE — Progress Notes (Signed)
   [facility]    OFFICE PROGRESS NOTE   INTERVAL HISTORY:   She returns as scheduled. He completed radiation today. No mouth sores or hand/foot pain . He has developed increased pain at the perineum and rectum, especially with bowel movements. Hydrocodone helps the pain. He has small-volume diarrhea several times per day.  Objective:  Vital signs in last 24 hours:  There were no vitals taken for this visit.    HEENT: No thrush or ulcers Resp: Lungs clear bilaterally Cardio: Regular rate and rhythm GI: No hepatomegaly, nontender Vascular: No leg edema  Skin: Palms without erythema. Erythema at the groin, gluteal fold, and perineum without skin breakdown     Lab Results:  Lab Results  Component Value Date   WBC 3.4* 04/27/2012   HGB 14.1 04/27/2012   HCT 41.7 04/27/2012   MCV 91.5 04/27/2012   PLT 168 04/27/2012   ANC 2.6   Medications: I have reviewed the patient's current medications.  Assessment/Plan: 1. Rectal cancer. Partially obstructing mass noted 1-2 cm from the anal verge on a colonoscopy 03/06/2012. Endoscopic ultrasound 03/14/2012 with a 7.5 mm thick, 3.2 cm wide hypoechoic, irregularly bordered mass that clearly passed into and through the muscularis propria layer of the distal rectal wall (uT3); 3 small (largest 7 mm) perirectal lymph nodes. The lymph nodes were all round, discrete, hypoechoic, homogenous; suspicious for malignant involvement (uN1). He began radiation and concurrent Xeloda chemotherapy on 03/20/2012, completed 04/27/2012  2. Irregular bowel habits/rectal bleeding secondary to #1. 3. Mild elevation of the liver enzymes. Question secondary to hepatic steatosis. 4. Indeterminate 8 mm posterior right liver lesion on the staging CT 03/06/2012. 5. Mildly elevated CEA at 5.7 on 03/06/2012. 6. Radiation erythema at the groin and perineum 7. 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  Mizrahi has completed neoadjuvant therapy. The skin and rectal symptoms should improve over the next few weeks. He is scheduled to see Dr. Maisie Fus for surgical planning on 05/16/2012. He will return for an office visit here on 06/30/2012. We will followup on the surgical pathology and decide on the indication for additional systemic therapy. Mr. Leger will contact us in the interim as needed.   Thornton Papas, MD  04/27/2012  3:45 PM

## 2012-04-27 NOTE — Telephone Encounter (Signed)
gv and printed appt schedule for pt for April °

## 2012-04-28 ENCOUNTER — Ambulatory Visit: Payer: BC Managed Care – PPO

## 2012-05-01 ENCOUNTER — Ambulatory Visit: Payer: BC Managed Care – PPO

## 2012-05-01 ENCOUNTER — Other Ambulatory Visit: Payer: Self-pay | Admitting: Nurse Practitioner

## 2012-05-01 NOTE — Telephone Encounter (Signed)
Patient left VM requesting refill on his Hydrocodone/apap. Reports he is out of medication. OK to refill X 1 per Dr. Truett Perna.

## 2012-05-02 ENCOUNTER — Ambulatory Visit: Payer: BC Managed Care – PPO

## 2012-05-03 ENCOUNTER — Ambulatory Visit: Payer: BC Managed Care – PPO

## 2012-05-04 ENCOUNTER — Ambulatory Visit: Payer: BC Managed Care – PPO

## 2012-05-05 ENCOUNTER — Ambulatory Visit: Payer: BC Managed Care – PPO

## 2012-05-07 ENCOUNTER — Other Ambulatory Visit: Payer: Self-pay

## 2012-05-08 ENCOUNTER — Ambulatory Visit: Payer: BC Managed Care – PPO

## 2012-05-16 ENCOUNTER — Ambulatory Visit (INDEPENDENT_AMBULATORY_CARE_PROVIDER_SITE_OTHER): Payer: BC Managed Care – PPO | Admitting: General Surgery

## 2012-05-16 ENCOUNTER — Encounter (INDEPENDENT_AMBULATORY_CARE_PROVIDER_SITE_OTHER): Payer: Self-pay | Admitting: General Surgery

## 2012-05-16 VITALS — BP 118/82 | HR 72 | Temp 97.6°F | Resp 12 | Ht 70.0 in | Wt 189.0 lb

## 2012-05-16 DIAGNOSIS — C20 Malignant neoplasm of rectum: Secondary | ICD-10-CM

## 2012-05-16 MED ORDER — METRONIDAZOLE 500 MG PO TABS: 500.0000 mg | ORAL_TABLET | ORAL | Status: DC

## 2012-05-16 NOTE — Progress Notes (Signed)
Jacob [name] is a 42 y.o. male who is here for a follow up visit regarding his rectal cancer.  He had his last radiation treatment on 2/6.  He had some rectal discomfort and diarrhea, but these symptoms are resolving.    Objective: Filed Vitals:   05/16/12 1109  BP: 118/82  Pulse: 72  Temp: 97.6 F (36.4 C)  Resp: 12    General appearance: alert and cooperative GI: soft, non-tender; bowel sounds normal; no masses,  no organomegaly Rectal: mass palpated at the L anterior rectum, ~1cm from top of sphincter complex  Assessment and Plan: Jacob [name] is a 42 y.o. M with a T3N1 rectal cancer.  He is s/p neoadjuvant treatment.  We discussed his operation in detail today.  I told him that we will try to preserve his sphincter unless it risks his cancer treatment.  He would like for [place] to try to preserve them if at all possible.  We will schedule this for early April.  I will have him see the ostomy RN for preop marking.   .  The specific risks include: Bleeding, infection and possible wound complications such as hernia, damage to adjacent structures, leak of surgical connections, which can lead to other surgeries and possibly an ostomy (5-7%), possible need for other procedures, such as abscess drains in radiology, possible prolonged hospital stay, possible diarrhea from removal of part of the colon, possible constipation from narcotics, prolonged fatigue/weakness or appetite loss, possible early recurrence of cancer, possible complications of their medical problems such as heart disease or arrhythmias or lung problems, death (less than 1%).  We also discussed the risk of some sexual dysfunction after surgery, which is usually greater than 50% of the time, although complete sexual dysfunction is rare.  We discussed bladder dysfunction as well due to nerve injury.  I believe the patient understands and wishes to proceed with the surgery.     Vanita Panda, MD [facility],  [place] [phone]

## 2012-05-16 NOTE — Patient Instructions (Addendum)
Colorectal Cancer  Colorectal cancer is the second most common cancer in the [place], striking 140,000 people annually and causing 60,000 deaths. That's a staggering figure when you consider the disease is potentially curable if diagnosed in the early stages. Who is at risk? Though colorectal cancer may occur at any age, more than 90% of the patients are over age 42, at which point the risk doubles every ten years. In addition to age, other high risk factors include a family history of colorectal cancer and polyps and a personal history of ulcerative colitis, colon polyps or cancer of other organs, especially of the breast or uterus. How does it start? It is generally agreed that nearly all colon and rectal cancer begins in benign polyps. These pre-malignant growths occur on the bowel wall and may eventually increase in size and become cancer. Removal of benign polyps is one aspect of preventive medicine that really works! What are the symptoms? The most common symptoms are rectal bleeding and changes in bowel habits, such as constipation or diarrhea. (These symptoms are also common in other diseases so it is important you receive a thorough examination should you experience them.) Abdominal pain and weight loss are usually late symptoms indicating possible extensive disease. Unfortunately, many polyps and early cancers fail to produce symptoms. Therefore, it is important that your routine physical includes colorectal cancer detection procedures once you reach age 42.  There are several methods for detection of colorectal cancer. These include digital rectal examination, a chemical test of the stool for blood, flexible sigmoidoscopy and colonoscopy (lighted tubular instruments used to inspect the lower bowel) and barium enema. Be sure to discuss these options with your surgeon to determine which procedure is best for you. Individuals who have a first-degree relative (parent or sibling) with colon  cancer or polyps should start their colon cancer screening at the age of 47. How is colorectal cancer treated? Colorectal cancer requires surgery in nearly all cases for complete cure. Radiation and chemotherapy are sometimes used in addition to surgery. Between 80-90% are restored to normal health if the cancer is detected and treated in the earliest stages. The cure rate drops to 50% or less when diagnosed in the later stages. Thanks to [employer], less than 5% of all colorectal cancer patients require a colostomy, the surgical construction of an artificial excretory opening from the colon. Can colon cancer be prevented? Colon cancer is very preventable. The most important step towards preventing colon cancer is getting a screening test. Any abnormal screening test should be followed by a colonoscopy. Some individuals prefer to start with colonoscopy as a screening test. Colonoscopy provides a detailed examination of the bowel. Polyps can be identified and can often be removed during colonoscopy. Though not definitely proven, there is some evidence that diet may play a significant role in preventing colorectal cancer. As far as we know, a high fiber, low fat diet is the only dietary measure that might help prevent colorectal cancer. Finally, pay attention to changes in your bowel habits. Any new changes such as persistent constipation, diarrhea, or blood in the stool should be discussed with your physician.   Can hemorrhoids lead to colon cancer? No, but hemorrhoids may produce symptoms similar to colon polyps or cancer. Should you experience these symptoms, you should have them examined and evaluated by a physician, preferably by a colon and rectal surgeon. What is a colon and rectal surgeon? Colon and rectal surgeons are experts in the surgical and non-surgical treatment  of diseases of the colon, rectum and anus. They have completed advanced surgical training in the treatment of these  diseases as well as full general surgical training. Board-certified colon and rectal surgeons complete residencies in general surgery and colon and rectal surgery, and pass intensive examinations conducted by the American Board of Surgery and the American Board of Colon and Rectal Surgery. They are well-versed in the treatment of both benign and malignant diseases of the colon, rectum and anus and are able to perform routine screening examinations and surgically treat conditions if indicated to do so.  2012 American Society of Colon & Rectal Surgeons      CENTRAL  SURGERY  ONE-DAY (1) PRE-OP HOME COLON PREP INSTRUCTIONS: ** MIRALAX / GATORADE PREP / FLAGYL**  You must follow the instructions below carefully.  If you have questions or problems, please call and speak to someone in the clinic department at our office:   [phone].     INSTRUCTIONS: 1. Five days prior to your procedure do not eat nuts, popcorn, or fruit with seeds.  Stop all fiber supplements such as Metamucil, Citrucel, etc. 2. Two days before surgery fill the prescription at a pharmacy of your choice and purchase the additional supplies below.         MIRALAX - GATORADE -- DULCOLAX TABS -- FLEET ENEMA:   Purchase a bottle of MIRALAX  (255 gm bottle)    In addition, purchase four (4) DULCOLAX TABLETS (no prescription required- ask the pharmacist if you can't find them)   Purchase one Fleet Enema (Green and white box)    Purchase one 64 oz GATORADE.  (Do NOT purchase red Gatorade; any other flavor is acceptable) and place in refrigerator to get cold.  3.   Day Before Surgery:   6 am: Wash you abdomen with soap and repeat this on the morning of surgery    take 4 Dulcolax tablets   You may only have clear liquids (tea, coffee, juice, broth, jello, soft drinks, gummy bears).  You cannot have solid foods, cream, milk or milk products.  Drink at lease 8 ounces of liquids every hour while awake.   Take the Flagyl  prescription as directed at 8 am, 2 pm and 8 pm.  It is helpful to take this with some jello instead of on an empty stomach.  Any flavor is ok, except red jello, which will cause red stools.   Mix the entire bottle of MiraLax and the Gatorade in a large container.    10:00am: Begin drinking the Gatorade mixture until gone (8 oz every 15-30 minutes).      You may suck on a lime wedge or hard candy to "freshen your palate" in between glasses   If you are a diabetic, take your blood sugar reading several time throughout the prep.  Have some juice available to take if your sugar level gets too low   You may feel chilled while taking the prep.  Have some warm tea or broth to help warm up.   Continue clear liquids until midnight or bedtime  3. The day of your procedure:   Do not eat or drink ANYTHING after midnight before your surgery.     If you take Heart or Blood Pressure medicine, ask the pre-op nurses about these during your preop appointment.   Take a regular Fleet Enema one hour before leaving the come to the hospital.  Try to hold it in 5-10 mins before expelling.   Further pre-operative  instructions will be given to you from the hospital.   Expect to be contacted 5-7 days before your surgery.

## 2012-05-22 ENCOUNTER — Encounter: Payer: Self-pay | Admitting: Radiation Oncology

## 2012-05-22 NOTE — Progress Notes (Signed)
  Radiation Oncology         [phone] ________________________________  Name: Jacob [name] MRN: [id number]  Date: 04/27/2012  DOB: 25-May-1970  End of Treatment Note  Diagnosis:   Rectal cancer     Indication for treatment:  Curative       Radiation treatment dates:   03/20/2012 through 04/27/2012  Site/dose:   The patient was initially treated using a whole pelvis technique with 4 fields to a dose of 45 gray at 1.8 gray per fraction. The patient then received a cone down, 4 field boost treatment for an additional 5.4 gray. The total dose was 50.4 gray.  Narrative: The patient tolerated radiation treatment relatively well.   The patient did well during treatment. He did experience some irritation of the perianal region towards the end of treatment.  Plan: The patient has completed radiation treatment. The patient will return to radiation oncology clinic for routine followup in one month. I advised the patient to call or return sooner if they have any questions or concerns related to their recovery or treatment. ________________________________  Radene Gunning, M.D., Ph.D.

## 2012-05-22 NOTE — Progress Notes (Signed)
  Radiation Oncology         [phone] ________________________________  Name: Jacob [name] MRN: [id number]  Date: 03/20/2012  DOB: Aug 06, 1970  Simulation Verification Note   NARRATIVE: The patient was brought to the treatment unit and placed in the planned treatment position. The clinical setup was verified. Then port films were obtained and uploaded to the radiation oncology medical record software.  The treatment beams were carefully compared against the planned radiation fields. The position, location, and shape of the radiation fields was reviewed. The targeted volume of tissue appears to be appropriately covered by the radiation beams. Based on my personal review, I approved the simulation verification. The patient's treatment will proceed as planned.  ________________________________   Radene Gunning, MD, PhD

## 2012-05-24 ENCOUNTER — Telehealth: Payer: Self-pay | Admitting: Oncology

## 2012-05-24 NOTE — Telephone Encounter (Signed)
Pt called and r/s appt for 4/11 to 4/18 1 week after pt's surgery

## 2012-05-25 ENCOUNTER — Ambulatory Visit (HOSPITAL_BASED_OUTPATIENT_CLINIC_OR_DEPARTMENT_OTHER): Payer: BC Managed Care – PPO | Admitting: Genetic Counselor

## 2012-05-25 ENCOUNTER — Other Ambulatory Visit: Payer: BC Managed Care – PPO

## 2012-05-25 DIAGNOSIS — IMO0002 Reserved for concepts with insufficient information to code with codable children: Secondary | ICD-10-CM

## 2012-05-25 DIAGNOSIS — C20 Malignant neoplasm of rectum: Secondary | ICD-10-CM

## 2012-05-29 ENCOUNTER — Encounter: Payer: Self-pay | Admitting: Genetic Counselor

## 2012-05-29 NOTE — Progress Notes (Signed)
Dr.  Thornton Papas requested a consultation for genetic counseling and risk assessment for Jacob [name], a 42 y.o. male, for discussion of his personal history of colon cancer. He presents to clinic today to discuss the possibility of a genetic predisposition to cancer, and to further clarify his risks, as well as his family members' risks for cancer.   HISTORY OF PRESENT ILLNESS: In December 2013, at the age of 63, Jacob [name] was diagnosed with colon cancer.    Past Medical History  Diagnosis Date  . Chicken pox as a child    ?  . Hernia 6 months old  . Allergic state 01/19/2012  . Reflux 01/19/2012  . Elevated liver function tests 01/19/2012  . Overweight 01/19/2012  . Rectal bleeding 01/19/2012  . Diarrhea 01/19/2012  . Preventative health care 01/19/2012  . Elevated BP 01/19/2012  . Sun-damaged skin 01/19/2012  . Migraine headache as a child  . Rectal bleeding   . Hx of migraines   . 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 Allergy     PCN  . Cancer 03/06/12    rectal bx=Adenocarcinoma  . GERD (gastroesophageal reflux disease)     Past Surgical History  Procedure Laterality Date  . Rectal biopsy  03/06/12    distal mass1-2cm from anal verge=Adenocarcinoma  . Eus  03/14/2012    Procedure: LOWER ENDOSCOPIC ULTRASOUND (EUS);  Surgeon: Rachael Fee, MD;  Location: [facility];  Service: Endoscopy;  Laterality: N/A;  . Hernia repair  6 months old    right inguinal repair    History  Substance Use Topics  . Smoking status: Never Smoker   . Smokeless tobacco: Never Used  . Alcohol Use: No     Comment: occasionally/ not as of now 03/20/12    PERSONAL RISK ASSESSMENT FACTORS: Colonoscopy: Found colon mass  FAMILY HISTORY:  We obtained a detailed, 4-generation family history.  Significant diagnoses are listed below: Family History  Problem Relation Age of Onset  . Adopted: Yes  . Cancer Father   . Heart disease Father   . Colon cancer Neg Hx    The patient is adopted.  He has no information on his biological family, other than he has a full brother who is 1-2 years older than he is.  Patient's maternal ancestors are of unknown descent, and paternal ancestors are of unknown descent. There is no reported Ashkenazi Jewish ancestry. There is no  known consanguinity.  GENETIC COUNSELING RISK ASSESSMENT, DISCUSSION, AND SUGGESTED FOLLOW UP: We reviewed the natural history and genetic etiology of sporadic, familial and hereditary cancer syndromes.  About 10% of colon cancers are hereditary.  Of these, 3% are the result of Lynch syndrome, and about 1-2% result from FAP.  We discussed polyposis syndromes, and discussed dominant inheritance patterns.  We also discussed recessive inheritance in the case of MUTYH mutations. As the patient is adopted, there is limited FH to go by.  We discussed both Lynch, FAP, AFAP and MUTYH conditions as being the most common causes of hereditary colorectal cancers, but that there are other polyposis syndromes that can be detected as well.  Therefore a panel test would be most efficient in providing information about his hereditary risk for colorectal cancer.  The patient's personal history of rectal cancer is suggestive of the following possible diagnosis: hereditary cancer syndrome  We discussed that identification of a hereditary cancer syndrome may help his care providers tailor the patients medical management. If a  mutation indicating a hereditary cancer syndrome is detected in this case, the [employer] recommendations would include increased cancer surveillance. If a mutation is detected, the patient will be referred back to the referring provider and to any additional appropriate care providers to discuss the relevant options.   If a mutation is not found in the patient, this will decrease the likelihood of a hereditary cancer syndrome as the explanation for his rectal cancer. Cancer  surveillance options would be discussed for the patient according to the appropriate standard National Comprehensive Cancer Network and American Cancer Society guidelines, with consideration of their personal and family history risk factors. In this case, the patient will be referred back to their care providers for discussions of management.   After considering the risks, benefits, and limitations, the patient provided informed consent for  the following  testing: comprehensive cancer panel through [facility].   Per the patient's request, we will contact him by telephone to discuss these results. A follow up genetic counseling visit will be scheduled if indicated.  The patient was seen for a total of 60 minutes, greater than 50% of which was spent face-to-face counseling.  This plan is being carried out per Dr. Mariel Sleet recommendations.  This note will also be sent to the referring provider via the electronic medical record. The patient will be supplied with a summary of this genetic counseling discussion as well as educational information on the discussed hereditary cancer syndromes following the conclusion of their visit.   Patient was discussed with Dr. Drue Second.  _______________________________________________________________________ For Office Staff:  Number of people involved in session: 3 Was an Intern/ student involved with case: no }

## 2012-05-31 ENCOUNTER — Encounter: Payer: Self-pay | Admitting: Radiation Oncology

## 2012-05-31 ENCOUNTER — Ambulatory Visit
Admission: RE | Admit: 2012-05-31 | Discharge: 2012-05-31 | Disposition: A | Discharge status: Home / Self Care | Payer: BC Managed Care – PPO | Source: Ambulatory Visit | Attending: Radiation Oncology | Admitting: Radiation Oncology

## 2012-05-31 VITALS — BP 127/84 | HR 87 | Temp 98.0°F | Wt 187.1 lb

## 2012-05-31 DIAGNOSIS — C20 Malignant neoplasm of rectum: Secondary | ICD-10-CM

## 2012-05-31 NOTE — Progress Notes (Signed)
  Radiation Oncology         [phone] ________________________________  Name: Jacob [name] MRN: [id number]  Date: 05/31/2012  DOB: 10/17/1970  Follow-Up Visit Note  CC: Danise Edge, MD  Ladene Artist, MD  Diagnosis:   Rectal cancer  Interval Since Last Radiation:  One month   Narrative:  The patient returns today for routine follow-up.  He indicates that he is done fairly well since he finished treatment. He did have a GI bug but otherwise his bowel movements have been good. These are improved as compared to prior to his course of radiation treatment. Some ongoing fatigue.                              ALLERGIES:  is allergic to penicillins.  Meds: Current Outpatient Prescriptions  Medication Sig Dispense Refill  . docusate sodium (COLACE) 100 MG capsule Take 100 mg by mouth 2 (two) times daily.       No current facility-administered medications for this encounter.    Physical Findings: The patient is in no acute distress. Patient is alert and oriented.  weight is 187 lb 1.6 oz (84.868 kg). His temperature is 98 F (36.7 C). His blood pressure is 127/84 and his pulse is 87. His oxygen saturation is 98%. .   General: Well-developed, in no acute distress HEENT: Normocephalic, atraumatic Cardiovascular: Regular rate and rhythm Respiratory: Clear to auscultation bilaterally GI: Soft, nontender, normal bowel sounds Extremities: No edema present   Lab Findings: Lab Results  Component Value Date   WBC 3.4* 04/27/2012   HGB 14.1 04/27/2012   HCT 41.7 04/27/2012   MCV 91.5 04/27/2012   PLT 168 04/27/2012     Radiographic Findings: No results found.  Impression:    The patient is doing well 1 month after completing his course of neoadjuvant radiotherapy.   Plan:  He is scheduled for surgery with Dr. Maisie Fus on 06/29/2012. He will return to clinic in 6 months for ongoing followup.   Radene Gunning, M.D., Ph.D.

## 2012-05-31 NOTE — Progress Notes (Signed)
Patient for routine one month follow up completion of radiation for rectal cancer.Bowels good.Improvement in fatigue but does get tired faster than before treatment.Scheduled for surgery on June 29, 2012 with Romie Levee.

## 2012-06-13 NOTE — Progress Notes (Signed)
Need orders in [facility] surgery on 06/29/12.  Thanks.

## 2012-06-14 ENCOUNTER — Other Ambulatory Visit (INDEPENDENT_AMBULATORY_CARE_PROVIDER_SITE_OTHER): Payer: Self-pay | Admitting: General Surgery

## 2012-06-14 ENCOUNTER — Encounter: Payer: Self-pay | Admitting: Family Medicine

## 2012-06-14 ENCOUNTER — Ambulatory Visit: Payer: BC Managed Care – PPO | Admitting: Family Medicine

## 2012-06-14 ENCOUNTER — Ambulatory Visit (INDEPENDENT_AMBULATORY_CARE_PROVIDER_SITE_OTHER): Payer: BC Managed Care – PPO | Admitting: Family Medicine

## 2012-06-14 VITALS — BP 126/74 | HR 74 | Temp 97.8°F | Ht 70.0 in | Wt 186.8 lb

## 2012-06-14 DIAGNOSIS — F411 Generalized anxiety disorder: Secondary | ICD-10-CM

## 2012-06-14 DIAGNOSIS — R4589 Other symptoms and signs involving emotional state: Secondary | ICD-10-CM

## 2012-06-14 DIAGNOSIS — F4321 Adjustment disorder with depressed mood: Secondary | ICD-10-CM

## 2012-06-14 DIAGNOSIS — R030 Elevated blood-pressure reading, without diagnosis of hypertension: Secondary | ICD-10-CM

## 2012-06-14 DIAGNOSIS — F419 Anxiety disorder, unspecified: Secondary | ICD-10-CM

## 2012-06-14 DIAGNOSIS — C20 Malignant neoplasm of rectum: Secondary | ICD-10-CM

## 2012-06-14 MED ORDER — ALPRAZOLAM 0.25 MG PO TABS: 0.2500 mg | ORAL_TABLET | Freq: Two times a day (BID) | ORAL | Status: DC | PRN

## 2012-06-14 NOTE — Patient Instructions (Addendum)
Start a probiotic daily such as [employer]age  Diarrhea Infections caused by germs (bacterial) or a virus commonly cause diarrhea. Your caregiver has determined that with time, rest and fluids, the diarrhea should improve. In general, eat normally while drinking more water than usual. Although water may prevent dehydration, it does not contain salt and minerals (electrolytes). Broths, weak tea without caffeine and oral rehydration solutions (ORS) replace fluids and electrolytes. Small amounts of fluids should be taken frequently. Large amounts at one time may not be tolerated. Plain water may be harmful in infants and the elderly. Oral rehydrating solutions (ORS) are available at pharmacies and grocery stores. ORS replace water and important electrolytes in proper proportions. Sports drinks are not as effective as ORS and may be harmful due to sugars worsening diarrhea.  ORS is especially recommended for use in children with diarrhea. As a general guideline for children, replace any new fluid losses from diarrhea and/or vomiting with ORS as follows:  If your child weighs 22 pounds or under (10 kg or less), give 60-120 mL ( -  cup or 2 - 4 ounces) of ORS for each episode of diarrheal stool or vomiting episode.  If your child weighs more than 22 pounds (more than 10 kgs), give 120-240 mL ( - 1 cup or 4 - 8 ounces) of ORS for each diarrheal stool or episode of vomiting.  While correcting for dehydration, children should eat normally. However, foods high in sugar should be avoided because this may worsen diarrhea. Large amounts of carbonated soft drinks, juice, gelatin desserts and other highly sugared drinks should be avoided.  After correction of dehydration, other liquids that are appealing to the child may be added. Children should drink small amounts of fluids frequently and fluids should be increased as tolerated. Children should drink enough fluids to keep urine clear or pale  yellow.  Adults should eat normally while drinking more fluids than usual. Drink small amounts of fluids frequently and increase as tolerated. Drink enough fluids to keep urine clear or pale yellow. Broths, weak decaffeinated tea, lemon lime soft drinks (allowed to go flat) and ORS replace fluids and electrolytes.  Avoid:  Carbonated drinks.  Juice.  Extremely hot or cold fluids.  Caffeine drinks.  Fatty, greasy foods.  Alcohol.  Tobacco.  Too much intake of anything at one time.  Gelatin desserts.  Probiotics are active cultures of beneficial bacteria. They may lessen the amount and number of diarrheal stools in adults. Probiotics can be found in yogurt with active cultures and in supplements.  Wash hands well to avoid spreading bacteria and virus.  Anti-diarrheal medications are not recommended for infants and children.  Only take over-the-counter or prescription medicines for pain, discomfort or fever as directed by your caregiver. Do not give aspirin to children because it may cause Reye's Syndrome.  For adults, ask your caregiver if you should continue all prescribed and over-the-counter medicines.  If your caregiver has given you a follow-up appointment, it is very important to keep that appointment. Not keeping the appointment could result in a chronic or permanent injury, and disability. If there is any problem keeping the appointment, you must call back to this facility for assistance. SEEK IMMEDIATE MEDICAL CARE IF:   You or your child is unable to keep fluids down or other symptoms or problems become worse in spite of treatment.  Vomiting or diarrhea develops and becomes persistent.  There is vomiting of blood or bile (green material).  There is blood  in the stool or the stools are black and tarry.  There is no urine output in 6-8 hours or there is only a small amount of very dark urine.  Abdominal pain develops, increases or localizes.  You have a  fever.  Your baby is older than 3 months with a rectal temperature of 102 F (38.9 C) or higher.  Your baby is 55 months old or younger with a rectal temperature of 100.4 F (38 C) or higher.  You or your child develops excessive weakness, dizziness, fainting or extreme thirst.  You or your child develops a rash, stiff neck, severe headache or become irritable or sleepy and difficult to awaken. MAKE SURE YOU:   Understand these instructions.  Will watch your condition.  Will get help right away if you are not doing well or get worse. Document Released: 02/26/2002 Document Revised: 05/31/2011 Document Reviewed: 01/13/2009 [facility] Patient Information 2013 [place], [place].

## 2012-06-16 ENCOUNTER — Encounter (HOSPITAL_COMMUNITY): Payer: Self-pay | Admitting: Pharmacy Technician

## 2012-06-18 ENCOUNTER — Encounter: Payer: Self-pay | Admitting: Family Medicine

## 2012-06-18 DIAGNOSIS — F4321 Adjustment disorder with depressed mood: Secondary | ICD-10-CM | POA: Insufficient documentation

## 2012-06-18 DIAGNOSIS — F4320 Adjustment disorder, unspecified: Secondary | ICD-10-CM | POA: Insufficient documentation

## 2012-06-18 NOTE — Assessment & Plan Note (Signed)
Given Alprazolam to use prn if he needs it as he has his colon cancer treated.

## 2012-06-18 NOTE — Assessment & Plan Note (Signed)
Has finished chemo/rad. Encouraged hi fiber and probiotics.

## 2012-06-18 NOTE — Progress Notes (Signed)
Patient ID: Jacob [name], male   DOB: [birth year], 42 y.o.   MRN: [id number] Jacob [name] [id number] 05-19-1970 06/18/2012      Progress Note-Follow Up  Subjective  Chief Complaint  Chief Complaint  Patient presents with  . Follow-up    3 month    HPI  Patient is a 42 year old Caucasian male who is in today for followup. He unfortunately has been diagnosed with colon cancer and undergone treatment since he was last seen. Is done with his chemoradiation now. Shortly after recovering from this he had normal virus but now says his bowels are beginning to improve. No diarrhea or abdominal pain. No fevers or chills. No anorexia. Jacob [name] ongoing stress but feels she's tolerating it relatively well. No other recent illness. No chest pain, palpitations, shortness of breath, GU complaints.  Past Medical History  Diagnosis Date  . Chicken pox as a child    ?  . Hernia 6 months old  . Allergic state 01/19/2012  . Reflux 01/19/2012  . Elevated liver function tests 01/19/2012  . Overweight 01/19/2012  . Rectal bleeding 01/19/2012  . Diarrhea 01/19/2012  . Preventative health care 01/19/2012  . Elevated BP 01/19/2012  . Sun-damaged skin 01/19/2012  . Migraine headache as a child  . Rectal bleeding   . Hx of migraines   . 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 Allergy     PCN  . Cancer 03/06/12    rectal bx=Adenocarcinoma  . GERD (gastroesophageal reflux disease)   . Radiation 03/20/12-04/27/12    Pelvis 50.4 gray Rectal cancer  . Grief reaction 06/18/2012    Past Surgical History  Procedure Laterality Date  . Rectal biopsy  03/06/12    distal mass1-2cm from anal verge=Adenocarcinoma  . Eus  03/14/2012    Procedure: LOWER ENDOSCOPIC ULTRASOUND (EUS);  Surgeon: Rachael Fee, MD;  Location: [facility];  Service: Endoscopy;  Laterality: N/A;  . Hernia repair  6 months old    right inguinal repair    Family History  Problem Relation Age of Onset  . Adopted: Yes  .  Cancer Father   . Heart disease Father   . Colon cancer Neg Hx     History   Social History  . Marital Status: Married    Spouse Name: N/A    Number of Children: 1  . Years of Education: N/A   Occupational History  . Scultor/Professor     [employer]   Social History Main Topics  . Smoking status: Never Smoker   . Smokeless tobacco: Never Used  . Alcohol Use: No     Comment: occasionally/ not as of now 03/20/12  . Drug Use: No     Comment: marijuana  . Sexually Active: Yes -- Male partner(s)   Other Topics Concern  . Not on file   Social History Narrative   Married to wife, Jacob [name]   Has one 49 year old child   Occupation: Teaches metal sculpture at [employer]    Current Outpatient Prescriptions on File Prior to Visit  Medication Sig Dispense Refill  . docusate sodium (COLACE) 100 MG capsule Take 100 mg by mouth daily.        No current facility-administered medications on file prior to visit.    Allergies  Allergen Reactions  . Penicillins     Does not recall    Review of Systems  Review of Systems  Constitutional: Negative for fever and malaise/fatigue.  HENT: Negative for congestion.  Eyes: Negative for discharge.  Respiratory: Negative for shortness of breath.   Cardiovascular: Negative for chest pain, palpitations and leg swelling.  Gastrointestinal: Negative for nausea, abdominal pain and diarrhea.  Genitourinary: Negative for dysuria.  Musculoskeletal: Negative for falls.  Skin: Negative for rash.  Neurological: Negative for loss of consciousness and headaches.  Endo/Heme/Allergies: Negative for polydipsia.  Psychiatric/Behavioral: Negative for depression and suicidal ideas. The patient is nervous/anxious. The patient does not have insomnia.     Objective  BP 126/74  Pulse 74  Temp(Src) 97.8 F (36.6 C) (Temporal)  Ht 5\' 10"  (1.778 m)  Wt 186 lb 12.8 oz (84.732 kg)  BMI 26.8 kg/m2  SpO2 99%  Physical Exam  Physical Exam  Constitutional: He is  oriented to person, place, and time and well-developed, well-nourished, and in no distress. No distress.  HENT:  Head: Normocephalic and atraumatic.  Eyes: Conjunctivae are normal.  Neck: Neck supple. No thyromegaly present.  Cardiovascular: Normal rate, regular rhythm and normal heart sounds.   No murmur heard. Pulmonary/Chest: Effort normal and breath sounds normal. No respiratory distress.  Abdominal: He exhibits no distension and no mass. There is no tenderness.  Musculoskeletal: He exhibits no edema.  Neurological: He is alert and oriented to person, place, and time.  Skin: Skin is warm.  Psychiatric: Memory, affect and judgment normal.    Lab Results  Component Value Date   TSH 1.47 01/19/2012   Lab Results  Component Value Date   WBC 3.4* 04/27/2012   HGB 14.1 04/27/2012   HCT 41.7 04/27/2012   MCV 91.5 04/27/2012   PLT 168 04/27/2012   Lab Results  Component Value Date   CREATININE 0.9 04/14/2012   BUN 13.8 04/14/2012   NA 140 04/14/2012   K 3.6 04/14/2012   CL 107 04/14/2012   CO2 23 04/14/2012   Lab Results  Component Value Date   ALT 108* 04/14/2012   AST 40* 04/14/2012   ALKPHOS 56 04/14/2012   BILITOT 0.30 04/14/2012     Assessment & Plan  Malignant neoplasm of rectum Has finished chemo/rad. Encouraged hi fiber and probiotics.   Elevated BP Well controlled, no changes,   Grief reaction Given Alprazolam to use prn if he needs it as he has his colon cancer treated.

## 2012-06-18 NOTE — Assessment & Plan Note (Signed)
Well controlled, no changes,

## 2012-06-23 ENCOUNTER — Encounter (HOSPITAL_COMMUNITY): Payer: Self-pay

## 2012-06-23 ENCOUNTER — Encounter (HOSPITAL_COMMUNITY)
Admission: RE | Admit: 2012-06-23 | Discharge: 2012-06-23 | Disposition: A | Discharge status: Home / Self Care | Payer: BC Managed Care – PPO | Source: Ambulatory Visit | Attending: General Surgery | Admitting: General Surgery

## 2012-06-23 DIAGNOSIS — Z01812 Encounter for preprocedural laboratory examination: Secondary | ICD-10-CM | POA: Insufficient documentation

## 2012-06-23 DIAGNOSIS — C20 Malignant neoplasm of rectum: Secondary | ICD-10-CM | POA: Insufficient documentation

## 2012-06-23 HISTORY — DX: Fatty (change of) liver, not elsewhere classified: K76.0

## 2012-06-23 LAB — BASIC METABOLIC PANEL
BUN: 19 mg/dL (ref 6–23)
CO2: 25 mEq/L (ref 19–32)
Chloride: 105 mEq/L (ref 96–112)
Glucose, Bld: 117 mg/dL — ABNORMAL HIGH (ref 70–99)
Potassium: 3.4 mEq/L — ABNORMAL LOW (ref 3.5–5.1)

## 2012-06-23 LAB — CBC
HCT: 41.9 % (ref 39.0–52.0)
Hemoglobin: 14.6 g/dL (ref 13.0–17.0)
WBC: 6.3 10*3/uL (ref 4.0–10.5)

## 2012-06-23 LAB — SURGICAL PCR SCREEN: Staphylococcus aureus: INVALID — AB

## 2012-06-23 NOTE — Patient Instructions (Addendum)
Jacob [name]  06/23/2012                           YOUR PROCEDURE IS SCHEDULED ON:  06/29/12               PLEASE REPORT TO SHORT STAY CENTER AT :  5:15 AM               CALL THIS NUMBER IF ANY PROBLEMS THE DAY OF SURGERY :               [phone]                      REMEMBER:   Do not eat food or drink liquids AFTER MIDNIGHT   Take these medicines the morning of surgery with A SIP OF WATER: XANAX   Do not wear jewelry, make-up   Do not wear lotions, powders, or perfumes.   Do not shave legs or underarms 12 hrs. before surgery (men may shave face)  Do not bring valuables to the hospital.  Contacts, dentures or bridgework may not be worn into surgery.  Leave suitcase in the car. After surgery it may be brought to your room.  For patients admitted to the hospital more than one night, checkout time is 11:00                          The day of discharge.   Patients discharged the day of surgery will not be allowed to drive home                             If going home same day of surgery, must have someone stay with you first                           24 hrs at home and arrange for some one to drive you home from hospital.    Special Instructions:   Please read over the following fact sheets that you were given:               1. MRSA  INFORMATION                      2. [place] PREPARING FOR SURGERY SHEET               3. FOLLOW BOWEL PREP                                                X_____________________________________________________________________        Failure to follow these instructions may result in cancellation of your surgery

## 2012-06-23 NOTE — Consult Note (Signed)
WOC ostomy consult  Notified per preadmission staff that this patient was in preadmission for scheduled surgery next week. Needs stoma site marking. Unclear based on notes if to be marked for colostomy or ileostomy.  Plans for laparoscopic low anterior resection.  I have marked him today for both ileostomy and colostomy Pt wears his jeans/pants fairly low, so I was not able to mark him below his belt line for this reason, pt is agreeable that we will mark well above the belt line to avoid trauma and/or complications with pouching.   Marked within the rectus muscle, LLQ and RLQ, avoiding any creases or abdominal folds, and within the patients visual field.  Evaluated marking while sitting and standing.  Wife with pt during site marking.  They will keep sites marked with surgical marking until surgery next week.  WOC team will follow up with patient after surgery for continued teaching.  Sent pt home with educational DVD and materials/pouching systems to review.   Georganne Siple Walthill RN,CWOCN [phone]

## 2012-06-26 LAB — MRSA CULTURE

## 2012-06-29 ENCOUNTER — Telehealth: Payer: Self-pay | Admitting: Genetic Counselor

## 2012-06-29 ENCOUNTER — Encounter (HOSPITAL_COMMUNITY): Payer: Self-pay | Admitting: Registered Nurse

## 2012-06-29 ENCOUNTER — Encounter (HOSPITAL_COMMUNITY)
Admission: RE | Disposition: A | Discharge status: Home / Self Care | Payer: Self-pay | Source: Ambulatory Visit | Attending: General Surgery

## 2012-06-29 ENCOUNTER — Inpatient Hospital Stay (HOSPITAL_COMMUNITY)
Admission: RE | Admit: 2012-06-29 | Discharge: 2012-07-04 | DRG: 148 | Disposition: A | Discharge status: Home / Self Care | Payer: BC Managed Care – PPO | Source: Ambulatory Visit | Attending: General Surgery | Admitting: General Surgery

## 2012-06-29 ENCOUNTER — Encounter (HOSPITAL_COMMUNITY): Payer: Self-pay

## 2012-06-29 ENCOUNTER — Ambulatory Visit (HOSPITAL_COMMUNITY): Payer: BC Managed Care – PPO | Admitting: Registered Nurse

## 2012-06-29 DIAGNOSIS — Z9221 Personal history of antineoplastic chemotherapy: Secondary | ICD-10-CM

## 2012-06-29 DIAGNOSIS — Z932 Ileostomy status: Secondary | ICD-10-CM

## 2012-06-29 DIAGNOSIS — F4320 Adjustment disorder, unspecified: Secondary | ICD-10-CM | POA: Diagnosis present

## 2012-06-29 DIAGNOSIS — K219 Gastro-esophageal reflux disease without esophagitis: Secondary | ICD-10-CM | POA: Diagnosis present

## 2012-06-29 DIAGNOSIS — C19 Malignant neoplasm of rectosigmoid junction: Secondary | ICD-10-CM

## 2012-06-29 DIAGNOSIS — E876 Hypokalemia: Secondary | ICD-10-CM | POA: Diagnosis not present

## 2012-06-29 DIAGNOSIS — E663 Overweight: Secondary | ICD-10-CM | POA: Diagnosis present

## 2012-06-29 DIAGNOSIS — Z79899 Other long term (current) drug therapy: Secondary | ICD-10-CM

## 2012-06-29 DIAGNOSIS — I10 Essential (primary) hypertension: Secondary | ICD-10-CM | POA: Diagnosis present

## 2012-06-29 DIAGNOSIS — Z923 Personal history of irradiation: Secondary | ICD-10-CM

## 2012-06-29 DIAGNOSIS — Z880 Allergy status to penicillin: Secondary | ICD-10-CM

## 2012-06-29 DIAGNOSIS — C20 Malignant neoplasm of rectum: Principal | ICD-10-CM

## 2012-06-29 DIAGNOSIS — F4321 Adjustment disorder with depressed mood: Secondary | ICD-10-CM | POA: Diagnosis present

## 2012-06-29 HISTORY — PX: LAPAROSCOPIC LOW ANTERIOR RESECTION: SHX5904

## 2012-06-29 LAB — TYPE AND SCREEN

## 2012-06-29 SURGERY — RESECTION, RECTUM, LOW ANTERIOR, LAPAROSCOPIC
Anesthesia: General | Wound class: Contaminated

## 2012-06-29 MED ORDER — PROPOFOL 10 MG/ML IV BOLUS
INTRAVENOUS | Status: DC | PRN
  Administered 2012-06-29: 200 mg via INTRAVENOUS

## 2012-06-29 MED ORDER — ONDANSETRON HCL 4 MG/2ML IJ SOLN
INTRAMUSCULAR | Status: DC | PRN
  Administered 2012-06-29: 4 mg via INTRAVENOUS

## 2012-06-29 MED ORDER — BUPIVACAINE-EPINEPHRINE PF 0.25-1:200000 % IJ SOLN
INTRAMUSCULAR | Status: AC
  Filled 2012-06-29: qty 30

## 2012-06-29 MED ORDER — SODIUM CHLORIDE 0.9 % IJ SOLN: 9.0000 mL | INTRAMUSCULAR | Status: DC | PRN

## 2012-06-29 MED ORDER — MIDAZOLAM HCL 5 MG/5ML IJ SOLN
INTRAMUSCULAR | Status: DC | PRN
  Administered 2012-06-29: 2 mg via INTRAVENOUS

## 2012-06-29 MED ORDER — LACTATED RINGERS IR SOLN
Status: DC | PRN
  Administered 2012-06-29: 1

## 2012-06-29 MED ORDER — HYDROMORPHONE HCL PF 1 MG/ML IJ SOLN
0.2500 mg | INTRAMUSCULAR | Status: DC | PRN
  Administered 2012-06-29 (×4): 0.5 mg via INTRAVENOUS

## 2012-06-29 MED ORDER — LACTATED RINGERS IV SOLN
INTRAVENOUS | Status: DC
  Administered 2012-06-29 – 2012-06-30 (×2): via INTRAVENOUS

## 2012-06-29 MED ORDER — FENTANYL CITRATE 0.05 MG/ML IJ SOLN
INTRAMUSCULAR | Status: DC | PRN
  Administered 2012-06-29: 50 ug via INTRAVENOUS
  Administered 2012-06-29 (×2): 100 ug via INTRAVENOUS
  Administered 2012-06-29 (×6): 50 ug via INTRAVENOUS

## 2012-06-29 MED ORDER — KETOROLAC TROMETHAMINE 15 MG/ML IJ SOLN
15.0000 mg | Freq: Four times a day (QID) | INTRAMUSCULAR | Status: DC | PRN
  Administered 2012-07-01: 15 mg via INTRAVENOUS
  Filled 2012-06-29: qty 1

## 2012-06-29 MED ORDER — NALOXONE HCL 0.4 MG/ML IJ SOLN: 0.4000 mg | INTRAMUSCULAR | Status: DC | PRN

## 2012-06-29 MED ORDER — ONDANSETRON HCL 4 MG/2ML IJ SOLN
4.0000 mg | Freq: Four times a day (QID) | INTRAMUSCULAR | Status: DC | PRN
  Administered 2012-06-29: 4 mg via INTRAVENOUS
  Filled 2012-06-29: qty 2

## 2012-06-29 MED ORDER — ALVIMOPAN 12 MG PO CAPS
12.0000 mg | ORAL_CAPSULE | Freq: Two times a day (BID) | ORAL | Status: DC
  Administered 2012-06-30 – 2012-07-02 (×6): 12 mg via ORAL
  Filled 2012-06-29 (×8): qty 1

## 2012-06-29 MED ORDER — HEPARIN SODIUM (PORCINE) 5000 UNIT/ML IJ SOLN
5000.0000 [IU] | Freq: Once | INTRAMUSCULAR | Status: AC
  Administered 2012-06-29: 5000 [IU] via SUBCUTANEOUS
  Filled 2012-06-29: qty 1

## 2012-06-29 MED ORDER — ESMOLOL HCL 10 MG/ML IV SOLN
INTRAVENOUS | Status: DC | PRN
  Administered 2012-06-29: 20 mg via INTRAVENOUS

## 2012-06-29 MED ORDER — 0.9 % SODIUM CHLORIDE (POUR BTL) OPTIME
TOPICAL | Status: DC | PRN
  Administered 2012-06-29: 2000 mL

## 2012-06-29 MED ORDER — PROMETHAZINE HCL 25 MG/ML IJ SOLN: 6.2500 mg | INTRAMUSCULAR | Status: DC | PRN

## 2012-06-29 MED ORDER — KETOROLAC TROMETHAMINE 30 MG/ML IJ SOLN
INTRAMUSCULAR | Status: DC | PRN
  Administered 2012-06-29: 30 mg via INTRAVENOUS

## 2012-06-29 MED ORDER — DEXTROSE 5 % IV SOLN
2.0000 g | INTRAVENOUS | Status: AC
  Administered 2012-06-29: 2 g via INTRAVENOUS
  Filled 2012-06-29: qty 2

## 2012-06-29 MED ORDER — MORPHINE SULFATE (PF) 1 MG/ML IV SOLN
INTRAVENOUS | Status: AC
  Administered 2012-06-29: 15:00:00
  Filled 2012-06-29: qty 25

## 2012-06-29 MED ORDER — HYDROMORPHONE HCL PF 1 MG/ML IJ SOLN
INTRAMUSCULAR | Status: DC | PRN
  Administered 2012-06-29 (×6): 0.5 mg via INTRAVENOUS

## 2012-06-29 MED ORDER — DEXTROSE 5 % IV SOLN
2.0000 g | Freq: Two times a day (BID) | INTRAVENOUS | Status: AC
  Administered 2012-06-29: 2 g via INTRAVENOUS
  Filled 2012-06-29: qty 2

## 2012-06-29 MED ORDER — ROCURONIUM BROMIDE 100 MG/10ML IV SOLN
INTRAVENOUS | Status: DC | PRN
  Administered 2012-06-29: 20 mg via INTRAVENOUS
  Administered 2012-06-29 (×7): 10 mg via INTRAVENOUS
  Administered 2012-06-29: 70 mg via INTRAVENOUS

## 2012-06-29 MED ORDER — MORPHINE SULFATE (PF) 1 MG/ML IV SOLN
INTRAVENOUS | Status: DC
  Administered 2012-06-29: 8 mg via INTRAVENOUS
  Administered 2012-06-29: 15 mg via INTRAVENOUS
  Administered 2012-06-29 – 2012-06-30 (×2): 7.5 mg via INTRAVENOUS
  Administered 2012-06-30: 12 mg via INTRAVENOUS
  Administered 2012-06-30: 19.5 mg via INTRAVENOUS
  Administered 2012-06-30: 21 mg via INTRAVENOUS
  Administered 2012-06-30: 23:00:00 via INTRAVENOUS
  Administered 2012-06-30: 13.39 mg via INTRAVENOUS
  Administered 2012-06-30: 07:00:00 via INTRAVENOUS
  Administered 2012-06-30: 19.2 mg via INTRAVENOUS
  Administered 2012-07-01: 6 mg via INTRAVENOUS
  Administered 2012-07-01: 33 mg via INTRAVENOUS
  Administered 2012-07-01: 12:00:00 via INTRAVENOUS
  Administered 2012-07-01: 10.5 mg via INTRAVENOUS
  Administered 2012-07-01: 170.61 mg via INTRAVENOUS
  Administered 2012-07-01: 19.5 mg via INTRAVENOUS
  Administered 2012-07-01 (×2): via INTRAVENOUS
  Administered 2012-07-02: 4.5 mg via INTRAVENOUS
  Administered 2012-07-02 (×2): 3 mg via INTRAVENOUS
  Administered 2012-07-02: 6 mg via INTRAVENOUS
  Administered 2012-07-02: 2.91 mg via INTRAVENOUS
  Administered 2012-07-02: 3 mg via INTRAVENOUS
  Administered 2012-07-03: 4.5 mg via INTRAVENOUS
  Administered 2012-07-03: 7 mg via INTRAVENOUS
  Administered 2012-07-03: 6 mg via INTRAVENOUS
  Filled 2012-06-29 (×10): qty 25

## 2012-06-29 MED ORDER — HYDROMORPHONE HCL PF 1 MG/ML IJ SOLN: 0.2500 mg | INTRAMUSCULAR | Status: DC | PRN

## 2012-06-29 MED ORDER — KETOROLAC TROMETHAMINE 30 MG/ML IJ SOLN: 15.0000 mg | Freq: Once | INTRAMUSCULAR | Status: DC | PRN

## 2012-06-29 MED ORDER — LIDOCAINE HCL (CARDIAC) 20 MG/ML IV SOLN
INTRAVENOUS | Status: DC | PRN
  Administered 2012-06-29: 100 mg via INTRAVENOUS

## 2012-06-29 MED ORDER — DIPHENHYDRAMINE HCL 12.5 MG/5ML PO ELIX: 12.5000 mg | ORAL_SOLUTION | Freq: Four times a day (QID) | ORAL | Status: DC | PRN

## 2012-06-29 MED ORDER — NEOSTIGMINE METHYLSULFATE 1 MG/ML IJ SOLN
INTRAMUSCULAR | Status: DC | PRN
  Administered 2012-06-29: 4 mg via INTRAVENOUS

## 2012-06-29 MED ORDER — DEXAMETHASONE SODIUM PHOSPHATE 10 MG/ML IJ SOLN
INTRAMUSCULAR | Status: DC | PRN
  Administered 2012-06-29: 10 mg via INTRAVENOUS

## 2012-06-29 MED ORDER — DIPHENHYDRAMINE HCL 50 MG/ML IJ SOLN: 12.5000 mg | Freq: Four times a day (QID) | INTRAMUSCULAR | Status: DC | PRN

## 2012-06-29 MED ORDER — HYDROMORPHONE HCL PF 1 MG/ML IJ SOLN
INTRAMUSCULAR | Status: AC
  Filled 2012-06-29: qty 1

## 2012-06-29 MED ORDER — LACTATED RINGERS IV SOLN
INTRAVENOUS | Status: DC | PRN
  Administered 2012-06-29 (×3): via INTRAVENOUS

## 2012-06-29 MED ORDER — ALVIMOPAN 12 MG PO CAPS
12.0000 mg | ORAL_CAPSULE | Freq: Once | ORAL | Status: AC
  Administered 2012-06-29: 12 mg via ORAL
  Filled 2012-06-29: qty 1

## 2012-06-29 MED ORDER — ENOXAPARIN SODIUM 40 MG/0.4ML ~~LOC~~ SOLN
40.0000 mg | SUBCUTANEOUS | Status: DC
  Administered 2012-06-30 – 2012-07-04 (×5): 40 mg via SUBCUTANEOUS
  Filled 2012-06-29 (×6): qty 0.4

## 2012-06-29 MED ORDER — LABETALOL HCL 5 MG/ML IV SOLN
INTRAVENOUS | Status: DC | PRN
  Administered 2012-06-29: 2.5 mg via INTRAVENOUS

## 2012-06-29 MED ORDER — GLYCOPYRROLATE 0.2 MG/ML IJ SOLN
INTRAMUSCULAR | Status: DC | PRN
  Administered 2012-06-29: .6 mg via INTRAVENOUS

## 2012-06-29 MED ORDER — KETOROLAC TROMETHAMINE 15 MG/ML IJ SOLN
15.0000 mg | Freq: Four times a day (QID) | INTRAMUSCULAR | Status: AC
  Administered 2012-06-29: 15 mg via INTRAVENOUS
  Filled 2012-06-29: qty 1

## 2012-06-29 SURGICAL SUPPLY — 80 items
APPLIER CLIP 5 13 M/L LIGAMAX5 (MISCELLANEOUS) ×2
BLADE EXTENDED COATED 6.5IN (ELECTRODE) ×2 IMPLANT
BLADE HEX COATED 2.75 (ELECTRODE) ×4 IMPLANT
BLADE SURG SZ10 CARB STEEL (BLADE) ×2 IMPLANT
CABLE HIGH FREQUENCY MONO STRZ (ELECTRODE) ×2 IMPLANT
CANISTER SUCTION 2500CC (MISCELLANEOUS) ×2 IMPLANT
CELLS DAT CNTRL 66122 CELL SVR (MISCELLANEOUS) IMPLANT
CHLORAPREP W/TINT 26ML (MISCELLANEOUS) ×2 IMPLANT
CLIP APPLIE 5 13 M/L LIGAMAX5 (MISCELLANEOUS) ×1 IMPLANT
CLOTH BEACON ORANGE TIMEOUT ST (SAFETY) ×2 IMPLANT
COVER MAYO STAND STRL (DRAPES) ×4 IMPLANT
DECANTER SPIKE VIAL GLASS SM (MISCELLANEOUS) ×4 IMPLANT
DERMABOND ADVANCED (GAUZE/BANDAGES/DRESSINGS) ×1
DERMABOND ADVANCED .7 DNX12 (GAUZE/BANDAGES/DRESSINGS) ×1 IMPLANT
DRAIN CHANNEL 19F RND (DRAIN) IMPLANT
DRAIN CHANNEL RND F F (WOUND CARE) ×2 IMPLANT
DRAPE LAPAROSCOPIC ABDOMINAL (DRAPES) ×2 IMPLANT
DRAPE LG THREE QUARTER DISP (DRAPES) ×4 IMPLANT
DRAPE UTILITY 15X26 (DRAPE) ×4 IMPLANT
DRAPE UTILITY W/TAPE 26X15 (DRAPES) ×4 IMPLANT
DRAPE WARM FLUID 44X44 (DRAPE) ×2 IMPLANT
ELECT REM PT RETURN 9FT ADLT (ELECTROSURGICAL) ×2
ELECTRODE REM PT RTRN 9FT ADLT (ELECTROSURGICAL) ×1 IMPLANT
EVACUATOR SILICONE 100CC (DRAIN) ×2 IMPLANT
FILTER SMOKE EVAC LAPAROSHD (FILTER) ×2 IMPLANT
GLOVE BIO SURGEON STRL SZ 6.5 (GLOVE) ×6 IMPLANT
GLOVE BIOGEL PI IND STRL 7.0 (GLOVE) ×2 IMPLANT
GLOVE BIOGEL PI INDICATOR 7.0 (GLOVE) ×2
GOWN BRE IMP PREV XXLGXLNG (GOWN DISPOSABLE) ×6 IMPLANT
GOWN STRL REIN XL XLG (GOWN DISPOSABLE) ×16 IMPLANT
KIT BASIN OR (CUSTOM PROCEDURE TRAY) ×4 IMPLANT
LEGGING LITHOTOMY PAIR STRL (DRAPES) ×2 IMPLANT
LIGASURE IMPACT 36 18CM CVD LR (INSTRUMENTS) IMPLANT
NS IRRIG 1000ML POUR BTL (IV SOLUTION) ×2 IMPLANT
PACK GENERAL/GYN (CUSTOM PROCEDURE TRAY) ×2 IMPLANT
PENCIL BUTTON HOLSTER BLD 10FT (ELECTRODE) ×4 IMPLANT
RETRACT II ENDO 10MM 32CML (ENDOMECHANICALS)
RETRACTOR II ENDO 10MM 32CML (ENDOMECHANICALS) IMPLANT
RETRACTOR LONE STAR DISPOSABLE (INSTRUMENTS) ×2 IMPLANT
RETRACTOR STAY HOOK 5MM (MISCELLANEOUS) ×2 IMPLANT
RTRCTR WOUND ALEXIS 18CM MED (MISCELLANEOUS)
SCISSORS LAP 5X35 DISP (ENDOMECHANICALS) ×2 IMPLANT
SEALER TISSUE G2 CVD JAW 35 (ENDOMECHANICALS) IMPLANT
SEALER TISSUE G2 CVD JAW 45CM (ENDOMECHANICALS)
SEALER TISSUE G2 STRG ARTC 35C (ENDOMECHANICALS) ×2 IMPLANT
SET IRRIG TUBING LAPAROSCOPIC (IRRIGATION / IRRIGATOR) ×2 IMPLANT
SLEEVE XCEL OPT CAN 5 100 (ENDOMECHANICALS) ×4 IMPLANT
SOLUTION ANTI FOG 6CC (MISCELLANEOUS) ×2 IMPLANT
SPONGE DRAIN TRACH 4X4 STRL 2S (GAUZE/BANDAGES/DRESSINGS) ×2 IMPLANT
SPONGE GAUZE 4X4 12PLY (GAUZE/BANDAGES/DRESSINGS) ×2 IMPLANT
SPONGE LAP 18X18 X RAY DECT (DISPOSABLE) ×2 IMPLANT
STAPLER VISISTAT 35W (STAPLE) ×2 IMPLANT
SUCTION POOLE TIP (SUCTIONS) ×2 IMPLANT
SUT ETHILON 2 0 PS N (SUTURE) IMPLANT
SUT MNCRL AB 4-0 PS2 18 (SUTURE) ×2 IMPLANT
SUT NOVA NAB DX-16 0-1 5-0 T12 (SUTURE) IMPLANT
SUT PDS AB 1 CTX 36 (SUTURE) IMPLANT
SUT PDS AB 1 TP1 96 (SUTURE) IMPLANT
SUT PROLENE 2 0 KS (SUTURE) IMPLANT
SUT SILK 2 0 (SUTURE) ×1
SUT SILK 2 0 SH CR/8 (SUTURE) ×2 IMPLANT
SUT SILK 2-0 18XBRD TIE 12 (SUTURE) ×1 IMPLANT
SUT SILK 3 0 (SUTURE) ×1
SUT SILK 3 0 SH CR/8 (SUTURE) ×2 IMPLANT
SUT SILK 3-0 18XBRD TIE 12 (SUTURE) ×1 IMPLANT
SUT VIC AB 2-0 SH 18 (SUTURE) ×4 IMPLANT
SUT VICRYL 0 UR6 27IN ABS (SUTURE) ×4 IMPLANT
SUT VICRYL 2 0 18  UND BR (SUTURE)
SUT VICRYL 2 0 18 UND BR (SUTURE) IMPLANT
SYS LAPSCP GELPORT 120MM (MISCELLANEOUS)
SYSTEM LAPSCP GELPORT 120MM (MISCELLANEOUS) IMPLANT
TAPE CLOTH 4X10 WHT NS (GAUZE/BANDAGES/DRESSINGS) ×2 IMPLANT
TOWEL OR 17X26 10 PK STRL BLUE (TOWEL DISPOSABLE) ×4 IMPLANT
TRAY FOLEY CATH 14FRSI W/METER (CATHETERS) ×2 IMPLANT
TRAY LAP CHOLE (CUSTOM PROCEDURE TRAY) ×2 IMPLANT
TROCAR BLADELESS OPT 5 100 (ENDOMECHANICALS) ×2 IMPLANT
TROCAR XCEL BLUNT TIP 100MML (ENDOMECHANICALS) ×2 IMPLANT
TROCAR XCEL NON-BLD 11X100MML (ENDOMECHANICALS) ×2 IMPLANT
TUBING FILTER THERMOFLATOR (ELECTROSURGICAL) ×2 IMPLANT
YANKAUER SUCT BULB TIP 10FT TU (MISCELLANEOUS) ×4 IMPLANT

## 2012-06-29 NOTE — Anesthesia Preprocedure Evaluation (Addendum)
Anesthesia Evaluation  Patient identified by MRN, date of birth, ID band Patient awake    Reviewed: Allergy & Precautions, H&P , NPO status , Patient's Chart, lab work & pertinent test results  History of Anesthesia Complications (+) AWARENESS UNDER ANESTHESIA  Airway Mallampati: II TM Distance: >3 FB Neck ROM: Full    Dental no notable dental hx.    Pulmonary neg pulmonary ROS,  breath sounds clear to auscultation  Pulmonary exam normal       Cardiovascular negative cardio ROS  Rhythm:Regular Rate:Normal     Neuro/Psych negative neurological ROS  negative psych ROS   GI/Hepatic negative GI ROS, Neg liver ROS,   Endo/Other  negative endocrine ROS  Renal/GU negative Renal ROS  negative genitourinary   Musculoskeletal negative musculoskeletal ROS (+)   Abdominal   Peds negative pediatric ROS (+)  Hematology negative hematology ROS (+)   Anesthesia Other Findings   Reproductive/Obstetrics negative OB ROS                          Anesthesia Physical Anesthesia Plan  ASA: I  Anesthesia Plan: General   Post-op Pain Management:    Induction: Intravenous  Airway Management Planned: Oral ETT  Additional Equipment:   Intra-op Plan:   Post-operative Plan: Extubation in OR  Informed Consent: I have reviewed the patients History and Physical, chart, labs and discussed the procedure including the risks, benefits and alternatives for the proposed anesthesia with the patient or authorized representative who has indicated his/her understanding and acceptance.   Dental advisory given  Plan Discussed with: CRNA and Surgeon  Anesthesia Plan Comments:         Anesthesia Quick Evaluation

## 2012-06-29 NOTE — Op Note (Signed)
06/29/2012  2:45 PM  PATIENT:  Jacob [name]  42 y.o. male  Patient Care Team: Bradd Canary, MD as PCP - General (Family Medicine) Rachael Fee, MD as Attending Physician (Gastroenterology)  PRE-OPERATIVE DIAGNOSIS:  rectal cancer   POST-OPERATIVE DIAGNOSIS:  rectal cancer  PROCEDURE:  Procedure(s): LAPAROSCOPIC LOW ANTERIOR RESECTION, mobilization splenic flexure,coloanal anastomosis,diverting ileostomy  SURGEON:  Surgeon(s): Romie Levee, MD Ardeth Sportsman, MD  ASSISTANT: Michaell Cowing   ANESTHESIA:   general  EBL:  Total I/O In: 4000 [I.V.:4000] Out: 630 [Urine:265; Drains:65; Blood:300]  Delay start of Pharmacological VTE agent (>24hrs) due to surgical blood loss or risk of bleeding:  no  DRAINS: (59F) Jackson-Pratt drain(s) with closed bulb suction in the pelvis   SPECIMEN:  Source of Specimen:  Rectum and Sigmoid Colon  DISPOSITION OF SPECIMEN:  PATHOLOGY  COUNTS:  YES  PLAN OF CARE: Admit to inpatient   PATIENT DISPOSITION:  PACU - hemodynamically stable.  INDICATION: this is a 42 year old male who was found to have a uT3 N1 distal rectal cancer. He underwent neoadjuvant chemotherapy and radiation. He is now ready for his resection. We discussed the risk and benefits of the operation as well as the plan to do a low resection and coloanal anastomosis versus APR.  OR FINDINGS: tumor approximately 1/2-2 cm proximal to the dentate line.  DESCRIPTION: The patient was identified in the preoperative holding area and taken to the OR where they were laid supine on the operating room table. Gen. anesthesia was smoothly induced.  The patient was then placed in lithotomy position and appropriately padded. His arms were tucked and a Foley catheter was inserted under sterile conditions. The patient was then prepped and draped in the usual sterile fashion. A surgical timeout was performed indicating the correct patient, procedure, positioning and preoperative antibiotics.  SCDs were noted to be in place and functioning prior to the operation.  I began by making an inferior umbilical incision using a scalpel. This carried down bluntly through the subcutaneous tissues. The fascia was elevated using 2 Kocher clamps. It was then incised at midline using the scalpel. After this was completed the peritoneum was entered bluntly.  I placed a pursestring suture using 2-0 Vicryl in the fascia. A Hassan port was placed under direct visualization.  The abdomen was then insufflated to approximately 15 mm mercury.  The abdomen was then evaluated. There were no signs of injury upon entry. I evaluated the entire abdomen. There were no peritoneal lesions. There are no liver masses noted. The colon appeared normal in size. I then turned attention to the lateral sidewall the sigmoid. This was adherent to the left lateral sidewall and taken down using blunt dissection and the Enseal device.  After this was completed I turned to the sacral promontory. The sigmoid artery was identified and and skeletonized. I identified the left ureter in its normal anatomic position. This was swept away from our dissection field.  I then identified the sigmoid artery high approximately 1-2 cm distal to its takeoff from the aorta.This was then transected using the energy device. There was some minor bleeding on the distal end which was controlled with a 5 mm clip. After this was completed mobilization continued.  We mobilized up the white line of Toldt. The splenic flexure was taken down using the energy device. The omentum was mobilized away from the colon. We mobilized approximately 4-5 cm proximal to the splenic flexure. After this was completely mobilized attention was turned to  the rectum. I identified the presacral plane and bluntly dissected this down to the level of the coccyx. The lateral sidewalls were then dissected free using the energy device. After this was completed we dissected anteriorly. We switched to  a 5 mm suprapubic trocar to a 10 mm port and a fan retractor was placed into the abdomen to elevate the prostate and seminal vesicles out of the way. We continued anteriorly to the level of the levators. This was then carried out laterally as well. Once we were down to the pelvic floor circumferentially I turned my attention to the patient's anal canal.  After this was completed a [place] retractor was placed over the anal canal. The tumor was palpated in the left posterior sidewall approximately 1-1/2 cm proximal to the dentate line. An incision was made using Bovie electrocautery at the dentate line circumferentially. I dissected down to the level of the internal sphincter and then dissected this upward several centimeters and then transected the internal sphincter at this point to gain entry into the pelvis. This was done circumferentially. After this was completed and the entire colon was free it was mobilized out of the anal canal using gentle retraction. This was mobilized the rectosigmoid junction. An incision was made at the rectosigmoid junction and there was no mucosal bleeding therefore it was decided that this would not be an adequately perfused area for anastomosis.  I turned attention back to the abdomen after re prepping and we identified the IMV. This was taken high at of the ligament of Treitz. After this the remaining colon was easily mobilized into the pelvis. We removed the remaining sigmoid the anal canal and a sharp incision revealed good bleeding in the mucosa. This was determined to be a good portion of colon for anastomosis. The colon was transected. The mesenteric vessels were tied using 2-0 silk suture ligatures. At the distal margin for our tumor was relatively close I took a portion of anoderm that coincided to the area of the tumor as an extra margin. A stitch was placed on the proximal portion of this margin that was abutting the tumor resection site. This was then sent to  pathology as a separate specimen. The colon was then anastomosed to the anal canal using interrupted 2-0 Vicryl sutures.  The anoderm was closed with 2 interrupted Vicryl sutures. The colon was then sent to pathology for further examination.  After this was completed we turned our attention back to the abdomen.  The pelvis was irrigated and a 19 [place] Blake drain was placed in the pelvis and brought out through the left lateral sidewall and sutured into place with a nylon suture. The omentum was brought down into the pelvis. We inspected the rest of the abdomen for hemostasis. There was no active bleeding. The suprapubic port site was then closed using interrupted 2-0 PDS suture and the Endo Close device. After this was completed a portion of the terminal ileum approximately 8-10 cm proximal to the ileocecal valve was identified. This mobilized to the abdominal wall appropriately. [place] dissector was used to create a defect in the mesentery underneath the bowel and a 16 French red rubber catheter was placed through this defect.  I then used this red rubber catheter to bring the bowel up through a defect created in the abdominal wall. This was done using Bovie electrocautery to remove an ellipse of skin and subcutaneous tissue. The fascia was incised in a cruciate manner. The rectus was split and the  peritoneum was divided using Bovie electrocautery. The terminal ileum was then brought up and the distal limb was positioned inferiorly. After this was completed the remaining ports were removed and the abdomen was desufflated. The umbilical incision was closed using the pursestring suture. The skin of the remaining 3 incisions were closed using interrupted 5-0 Vicryl sutures and Dermabond. After this was completed the loop ileostomy was matured in standard [place] fashion. This was done using interrupted 2-0 Vicryl sutures. The red rubber catheter was secured as a loop and will be used as an ostomy bar. After this  was completed, the patient was awakened from anesthesia and sent to the postanesthesia care unit in stable condition. All counts were correct per operating room staff.

## 2012-06-29 NOTE — Progress Notes (Signed)
Dr. Okey Dupre, anesthes. Made aware of pt's c/o left arm/elbow pain; meds have been given for discomfort

## 2012-06-29 NOTE — Transfer of Care (Signed)
Immediate Anesthesia Transfer of Care Note  Patient: Jacob [name]  Procedure(s) Performed: Procedure(s): LAPAROSCOPIC LOW ANTERIOR RESECTION, mobilization splenic flexure,coloanal anastomosis,diverting ileostomy (N/A)  Patient Location: PACU  Anesthesia Type:General  Level of Consciousness: awake, alert , oriented and patient cooperative  Airway & Oxygen Therapy: Patient Spontanous Breathing and Patient connected to face mask oxygen  Post-op Assessment: Report given to PACU RN, Post -op Vital signs reviewed and stable and Patient moving all extremities  Post vital signs: Reviewed and stable  Complications: No apparent anesthesia complications

## 2012-06-29 NOTE — Telephone Encounter (Signed)
Left message with his wife that we had good news on his test results.  Asked that he call back.  She said he had surgery today and is at the hospital.  I told her taht this is not urgent.

## 2012-06-29 NOTE — Preoperative (Signed)
Beta Blockers   Reason not to administer Beta Blockers:Not Applicable 

## 2012-06-29 NOTE — Anesthesia Postprocedure Evaluation (Signed)
  Anesthesia Post-op Note  Patient: Jacob [name]  Procedure(s) Performed: Procedure(s) (LRB): LAPAROSCOPIC LOW ANTERIOR RESECTION, mobilization splenic flexure,coloanal anastomosis,diverting ileostomy (N/A)  Patient Location: PACU  Anesthesia Type: General  Level of Consciousness: awake and alert   Airway and Oxygen Therapy: Patient Spontanous Breathing  Post-op Pain: mild  Post-op Assessment: Post-op Vital signs reviewed, Patient's Cardiovascular Status Stable, Respiratory Function Stable, Patent Airway and No signs of Nausea or vomiting  Last Vitals:  Filed Vitals:   06/29/12 1417  BP: 160/79  Pulse: 100  Temp: 36.9 C  Resp: 14    Post-op Vital Signs: stable   Complications: No apparent anesthesia complications

## 2012-06-29 NOTE — H&P (Signed)
Chief Complaint   Patient presents with   .  Pre-op Exam     eval rectal ca   .  Rectal Problems   HISTORY:  Jacob [name] is a 42 y.o. male who presents to the office with rectal cancer. This was found colonoscopy that was performed for some rectal bleeding and abdominal bloating. Workup thus far has included a rectal [place], which showed a T3N1 tumor. His CT scans were negative for metastatic disease.  The remaining portion of his colonoscopy was unremarkable. He does not know his family history due to being adopted. He denies any weight loss and has minimal rectal bleeding.   Past Medical History   Diagnosis  Date   .  Chicken pox  as a child     ?   .  Hernia  6 months old   .  Allergic state  01/19/2012   .  Reflux  01/19/2012   .  Elevated liver function tests  01/19/2012   .  Overweight  01/19/2012   .  Rectal bleeding  01/19/2012   .  Diarrhea  01/19/2012   .  Preventative health care  01/19/2012   .  Elevated BP  01/19/2012   .  Sun-damaged skin  01/19/2012   .  Migraine headache  as a child   .  Rectal bleeding    .  Hx of migraines    .  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  Allergy      PCN   .  Cancer  03/06/12     rectal bx=Adenocarcinoma   .  GERD (gastroesophageal reflux disease)     Past Surgical History   Procedure  Date   .  Rectal biopsy  03/06/12     distal mass1-2cm from anal verge=Adenocarcinoma   .  Eus  03/14/2012     Procedure: LOWER ENDOSCOPIC ULTRASOUND (EUS); Surgeon: Rachael Fee, MD; Location: [facility]; Service: Endoscopy; Laterality: N/A;   .  Hernia repair  6 months old     right inguinal repair    Current Outpatient Prescriptions   Medication  Sig  Dispense  Refill   .  capecitabine (XELODA) 500 MG tablet  #4 (2000 mg) every am and #3 (1500 mg) every pm = 3500 mg total daily dose by mouth  Take on days of radiation only (M-F)  140 tablet  0   .  docusate sodium (COLACE) 100 MG capsule  Take 100 mg by mouth 2 (two)  times daily.     .  prochlorperazine (COMPAZINE) 10 MG tablet  Take 1 tablet (10 mg total) by mouth every 6 (six) hours as needed (nausea).  60 tablet  2    Allergies   Allergen  Reactions   .  Penicillins      Does not recall    Family History   Problem  Relation  Age of Onset   .  Adopted: Yes   .  Cancer  Father    .  Heart disease  Father    .  Colon cancer  Neg Hx     History    Social History   .  Marital Status:  Married     Spouse Name:  N/A     Number of Children:  1   .  Years of Education:  N/A    Occupational History   .  Scultor/Professor      [employer]  Social History Main Topics   .  Smoking status:  Never Smoker   .  Smokeless tobacco:  Never Used   .  Alcohol Use:  No      Comment: occasionally/ not as of now 03/20/12   .  Drug Use:  No      Comment: marijuana   .  Sexually Active:  Yes -- Male partner(s)    Other Topics  Concern   .  None    Social History Narrative    Married to wife, Cecille Amsterdam one 72 year old childOccupation: Teaches metal sculpture at [employer]    REVIEW OF SYSTEMS - PERTINENT POSITIVES ONLY:  Review of Systems - General ROS: negative for - chills or fever  Allergy and Immunology ROS: negative for - hives  Hematological and Lymphatic ROS: negative for - bleeding problems, blood clots or jaundice  Respiratory ROS: no cough, shortness of breath, or wheezing  Cardiovascular ROS: no chest pain or dyspnea on exertion  Gastrointestinal ROS: positive for - appetite loss, blood in stools and change in bowel habits  negative for - nausea/vomiting  Genito-Urinary ROS: no dysuria, trouble voiding, or hematuria  EXAM:  Filed Vitals:   06/29/12 0526  BP: 139/90  Pulse: 75  Temp: 97.5 F (36.4 C)  Resp: 18   Gen: No acute distress. Well nourished and well groomed.  Neurological: Alert and oriented to person, place, and time. Coordination normal.  Head: Normocephalic and atraumatic.  Eyes: Conjunctivae are normal. Pupils are equal, round,  and reactive to light. No scleral icterus.  Neck: Normal range of motion. Neck supple. No tracheal deviation or thyromegaly present. No cervical lymphadenopathy.  Cardiovascular: Normal rate, regular rhythm, normal heart sounds and intact distal pulses. Exam reveals no gallop and no friction rub. No murmur heard.  Respiratory: Effort normal. No respiratory distress. No chest wall tenderness. Breath sounds normal. No wheezes, rales or rhonchi.  GI: Soft. Bowel sounds are normal. The abdomen is soft and nontender. There is no rebound and no guarding.  Musculoskeletal: Normal range of motion. Extremities are nontender.  Skin: Skin is warm and dry. No rash noted. No diaphoresis. No erythema. No pallor. No clubbing, cyanosis, or edema.  Psychiatric: Normal mood and affect. Behavior is normal. Judgment and thought content normal.  Anorectal Exam: left anterior mass, ~1-2 cm from sphincter complex.   LABORATORY RESULTS:  Available labs are reviewed  Lab Results   Component  Value  Date    CEA  5.7*  03/06/2012    Lab Results  Component Value Date   CREATININE 0.78 06/23/2012    Lab Results  Component Value Date   WBC 6.3 06/23/2012   HGB 14.6 06/23/2012   HCT 41.9 06/23/2012   MCV 92.9 06/23/2012   PLT 210 06/23/2012    RADIOLOGY RESULTS:  Images and reports are reviewed.  CT Chest IMPRESSION:  1. No evidence of thoracic metastatic disease or acute chest process.  2. Stable small low density hepatic lesion.  CT ABD IMPRESSION:  1. Posterior rectal wall mural thickening with presacral fat stranding. This may be inflammatory or neoplastic. No pelvic or inguinal adenopathy in this patient with suspected rectal cancer No evidence of distant metastatic disease.  2. Tiny low density lesion in the right hepatic lobe measuring 8 mm is too small to characterize. Attention on follow-up is recommended.  EUS: T3N1   ASSESSMENT AND PLAN:  Jacob [name] is a 42 y.o. M who has a low rectal cancer. He  is T3N1 by  EUS and is currently s/p neoadjuvant chemoradiation. His tumor is very low, and I have told him that we will see how it responds to therapy.  I will evaluate him intraoperatively for LAR vs APR.   I will see him back in 2 months once his treatment is complete. Given his young age and lack of knowledge about his family history, I have referred him to genetics counseling as well.   The surgery and anatomy were described to the patient as well as the risks of surgery and the possible complications.  These include: Bleeding, infection and possible wound complications such as hernia, damage to adjacent structures, leak of surgical connections, which can lead to other surgeries, possible need for other procedures, such as abscess drains in radiology, possible prolonged hospital stay, possible diarrhea from removal of part of the colon, possible constipation from narcotics, prolonged fatigue/weakness or appetite loss, possible early recurrence of cancer, possible complications of their medical problems such as heart disease or arrhythmias or lung problems, death (less than 1%).  We discussed that if the cancer was too invasive to do an LAR, we would proceed with APR.  He knows that he will at least need a temporary ostomy and if we do an APR, he will have a permanent ostomy.  I have talked to him about the specific risks of rectal surgery which include temporary and permanent bladder and sexual dysfunction. I believe the patient understands and wishes to proceed with the surgery.   Vanita Panda, MD  Colon and Rectal Surgery / General Surgery  [facility]

## 2012-06-30 ENCOUNTER — Encounter (HOSPITAL_COMMUNITY): Payer: Self-pay | Admitting: General Surgery

## 2012-06-30 ENCOUNTER — Ambulatory Visit: Payer: BC Managed Care – PPO | Admitting: Oncology

## 2012-06-30 LAB — BASIC METABOLIC PANEL
BUN: 12 mg/dL (ref 6–23)
Chloride: 103 mEq/L (ref 96–112)
Creatinine, Ser: 0.9 mg/dL (ref 0.50–1.35)
Glucose, Bld: 115 mg/dL — ABNORMAL HIGH (ref 70–99)
Potassium: 3.6 mEq/L (ref 3.5–5.1)

## 2012-06-30 LAB — CBC
HCT: 33.1 % — ABNORMAL LOW (ref 39.0–52.0)
Hemoglobin: 11.6 g/dL — ABNORMAL LOW (ref 13.0–17.0)
MCH: 32.5 pg (ref 26.0–34.0)
MCHC: 35 g/dL (ref 30.0–36.0)
MCV: 92.7 fL (ref 78.0–100.0)
RDW: 13.6 % (ref 11.5–15.5)

## 2012-06-30 MED ORDER — KCL IN DEXTROSE-NACL 20-5-0.45 MEQ/L-%-% IV SOLN
INTRAVENOUS | Status: DC
  Administered 2012-06-30 – 2012-07-03 (×3): via INTRAVENOUS
  Filled 2012-06-30 (×3): qty 1000

## 2012-06-30 NOTE — Consult Note (Signed)
WOC ostomy consult  Stoma type/location: RLQ loop  ileostomy (temporary) with red robinson catheter "bridge" intact Stomal assessment/size: 1 and 3/8 inches with os at center Peristomal assessment: intact, clear Treatment options for stomal/peristomal skin: None indicated Output thin brown effluent Ostomy pouching: 2 piece pouching system with skin barrier ring..  Education provided: Extended session with mother and sister (and Comptroller) in attendance (wife and young daughter present at end). A&P, stoma characteristics, pouch characteristics, diet, sick day rules, taught,  Patient able to give return demonstration on Lock N' Roll closure.  Crease noted at distal margin of stoma (5-7 o'clock), nevertheless, I applied a skin barrier ring and a 2-piece pouching system.  I will reassess this over the weekend. If this appears to be a problem, I will attempt a 1-piece pouching system.  Educational booklets provided, asking appropriate questions.   I will follow with you.   Thanks, Ladona Mow, MSN, RN, [facility], CWOCN [phone])

## 2012-06-30 NOTE — Progress Notes (Signed)
1 Day Post-Op Laparoscopic hemicolectomy and proctectomy with coloanal anastomosis and diverting ileostomy Subjective: He is doing well.  He had some pain issues after surgery but these are better now on the PCA.  He denies nausea.  Objective: Vital signs in last 24 hours: Temp:  [97.1 F (36.2 C)-98.4 F (36.9 C)] 98 F (36.7 C) (04/11 0626) Pulse Rate:  [74-100] 79 (04/11 0626) Resp:  [14-22] 18 (04/11 0744) BP: (110-160)/(61-79) 113/61 mmHg (04/11 0626) SpO2:  [96 %-100 %] 97 % (04/11 0744) FiO2 (%):  [35 %] 35 % (04/10 1854) Weight:  [173 lb 14.4 oz (78.881 kg)] 173 lb 14.4 oz (78.881 kg) (04/10 1900)   Intake/Output from previous day: 04/10 0701 - 04/11 0700 In: 6308.3 [I.V.:6308.3] Out: 1515 [Urine:1040; Drains:175; Blood:300] Intake/Output this shift:     General appearance: alert and cooperative GI: normal findings: soft, appropriately tender JP: SS fluid Incision: no significant drainage  Lab Results:   Recent Labs  06/30/12 0417  WBC 8.7  HGB 11.6*  HCT 33.1*  PLT 193   BMET  Recent Labs  06/30/12 0417  NA 139  K 3.6  CL 103  CO2 31  GLUCOSE 115*  BUN 12  CREATININE 0.90  CALCIUM 8.7   PT/INR No results found for this basename: LABPROT, INR,  in the last 72 hours ABG No results found for this basename: PHART, PCO2, PO2, HCO3,  in the last 72 hours  MEDS, Scheduled . alvimopan  12 mg Oral BID  . enoxaparin (LOVENOX) injection  40 mg Subcutaneous Q24H  . morphine   Intravenous Q4H    Studies/Results: No results found.  Assessment: s/p Procedure(s): LAPAROSCOPIC LOW ANTERIOR RESECTION, mobilization splenic flexure,coloanal anastomosis,diverting ileostomy Patient Active Problem List  Diagnosis  . Reflux  . Elevated liver function tests  . Overweight  . Elevated BP  . Sun-damaged skin  . Cancer  . Allergy  . GERD (gastroesophageal reflux disease)  . Malignant neoplasm of rectum  . Grief reaction    Expected post op course.     Plan: Advance diet to clears Decrease MIV to 1ml/h Cont DVT prophylaxis Ambulate in hall Cont foley Cont PCA   LOS: 1 day     .Vanita Panda, MD [facility], [place] [phone]   06/30/2012 9:22 AM

## 2012-06-30 NOTE — Care Management Note (Addendum)
    Page 1 of 1   07/04/2012     10:48:37 AM   CARE MANAGEMENT NOTE 07/04/2012  Patient:  Jacob [name], Jacob [name]   Account Number:  [id number]  Date Initiated:  06/30/2012  Documentation initiated by:  Lorenda Ishihara  Subjective/Objective Assessment:   42 yo male admitted s/p LAR and diverting ileostomy. PTA lived at home with spouse.     Action/Plan:   Home when stable   Anticipated DC Date:  06/30/2012   Anticipated DC Plan:  HOME W HOME HEALTH SERVICES      DC Planning Services  CM consult      [facility] Choice  HOME HEALTH   Choice offered to / List presented to:  C-1 Patient        HH arranged  HH-1 RN      [facility] agency  [facility]   Status of service:  Completed, signed off Medicare Important Message given?   (If response is "NO", the following Medicare IM given date fields will be blank) Date Medicare IM given:   Date Additional Medicare IM given:    Discharge Disposition:  HOME W HOME HEALTH SERVICES  Per UR Regulation:  Reviewed for med. necessity/level of care/duration of stay  If discussed at Long Length of Stay Meetings, dates discussed:    Comments:

## 2012-07-01 DIAGNOSIS — Z932 Ileostomy status: Secondary | ICD-10-CM

## 2012-07-01 LAB — BASIC METABOLIC PANEL
BUN: 8 mg/dL (ref 6–23)
Calcium: 8.7 mg/dL (ref 8.4–10.5)
Creatinine, Ser: 0.79 mg/dL (ref 0.50–1.35)
GFR calc Af Amer: >90 mL/min (ref >90)
GFR calc non Af Amer: >90 mL/min (ref >90)
Glucose, Bld: 98 mg/dL (ref 70–99)
Potassium: 3.3 mEq/L — ABNORMAL LOW (ref 3.5–5.1)

## 2012-07-01 LAB — CBC
Hemoglobin: 12.1 g/dL — ABNORMAL LOW (ref 13.0–17.0)
MCH: 32.1 pg (ref 26.0–34.0)
MCHC: 34.1 g/dL (ref 30.0–36.0)
RDW: 13.3 % (ref 11.5–15.5)

## 2012-07-01 MED ORDER — LACTATED RINGERS IV BOLUS (SEPSIS): 1000.0000 mL | Freq: Three times a day (TID) | INTRAVENOUS | Status: AC | PRN

## 2012-07-01 MED ORDER — SODIUM CHLORIDE 0.9 % IV SOLN: 250.0000 mL | INTRAVENOUS | Status: DC | PRN

## 2012-07-01 MED ORDER — SODIUM CHLORIDE 0.9 % IJ SOLN
3.0000 mL | Freq: Two times a day (BID) | INTRAMUSCULAR | Status: DC
  Administered 2012-07-01 – 2012-07-03 (×2): 3 mL via INTRAVENOUS

## 2012-07-01 MED ORDER — ACETAMINOPHEN 500 MG PO TABS
1000.0000 mg | ORAL_TABLET | Freq: Three times a day (TID) | ORAL | Status: DC
  Administered 2012-07-01 – 2012-07-02 (×4): 1000 mg via ORAL
  Filled 2012-07-01 (×12): qty 2

## 2012-07-01 MED ORDER — HYDROCORTISONE ACE-PRAMOXINE 2.5-1 % RE CREA
1.0000 "application " | TOPICAL_CREAM | Freq: Four times a day (QID) | RECTAL | Status: DC | PRN
  Filled 2012-07-01: qty 30

## 2012-07-01 MED ORDER — METOPROLOL TARTRATE 12.5 MG HALF TABLET
12.5000 mg | ORAL_TABLET | Freq: Two times a day (BID) | ORAL | Status: DC | PRN
  Filled 2012-07-01: qty 1

## 2012-07-01 MED ORDER — MAGIC MOUTHWASH
15.0000 mL | Freq: Four times a day (QID) | ORAL | Status: DC | PRN
  Filled 2012-07-01: qty 15

## 2012-07-01 MED ORDER — ALPRAZOLAM 0.25 MG PO TABS: 0.2500 mg | ORAL_TABLET | Freq: Two times a day (BID) | ORAL | Status: DC | PRN

## 2012-07-01 MED ORDER — SACCHAROMYCES BOULARDII 250 MG PO CAPS
250.0000 mg | ORAL_CAPSULE | Freq: Two times a day (BID) | ORAL | Status: DC
  Administered 2012-07-01 – 2012-07-04 (×7): 250 mg via ORAL
  Filled 2012-07-01 (×8): qty 1

## 2012-07-01 MED ORDER — ALUM & MAG HYDROXIDE-SIMETH 200-200-20 MG/5ML PO SUSP: 30.0000 mL | Freq: Four times a day (QID) | ORAL | Status: DC | PRN

## 2012-07-01 MED ORDER — SODIUM CHLORIDE 0.9 % IJ SOLN: 3.0000 mL | INTRAMUSCULAR | Status: DC | PRN

## 2012-07-01 MED ORDER — LIP MEDEX EX OINT
1.0000 "application " | TOPICAL_OINTMENT | Freq: Two times a day (BID) | CUTANEOUS | Status: DC
  Administered 2012-07-01 – 2012-07-04 (×6): 1 via TOPICAL

## 2012-07-01 MED ORDER — LOPERAMIDE HCL 2 MG PO CAPS: 2.0000 mg | ORAL_CAPSULE | Freq: Three times a day (TID) | ORAL | Status: DC | PRN

## 2012-07-01 MED ORDER — BISMUTH SUBSALICYLATE 262 MG/15ML PO SUSP: 30.0000 mL | Freq: Three times a day (TID) | ORAL | Status: DC | PRN

## 2012-07-01 MED ORDER — WITCH HAZEL-GLYCERIN EX PADS
1.0000 "application " | MEDICATED_PAD | CUTANEOUS | Status: DC | PRN
  Administered 2012-07-04: 1 via TOPICAL
  Filled 2012-07-01: qty 100

## 2012-07-01 MED ORDER — KETOROLAC TROMETHAMINE 15 MG/ML IJ SOLN
30.0000 mg | Freq: Four times a day (QID) | INTRAMUSCULAR | Status: DC
  Administered 2012-07-01 (×3): 30 mg via INTRAVENOUS
  Filled 2012-07-01 (×4): qty 1
  Filled 2012-07-01: qty 2

## 2012-07-01 NOTE — Progress Notes (Signed)
Jacob [name] [id number] [birth year]  CARE TEAM:  PCP: Danise Edge, MD  Outpatient Care Team: Patient Care Team: Bradd Canary, MD as PCP - General (Family Medicine) Rachael Fee, MD as Attending Physician (Gastroenterology)  Inpatient Treatment Team: Treatment Team: Attending Provider: Romie Levee, MD; Registered Nurse: Bethann Goo, RN; Registered Nurse: Jonathon Bellows, RN   Subjective:  Sore Walking more  Objective:  Vital signs:  Filed Vitals:   06/30/12 2131 07/01/12 0400 07/01/12 0506 07/01/12 0527  BP: 145/81   119/74  Pulse: 82   72  Temp: 98.2 F (36.8 C)   98.2 F (36.8 C)  TempSrc: Oral   Oral  Resp: 21 18 16 15   Height:      Weight:      SpO2: 97% 98% 99% 99%    Last BM Date: 07/01/12  Intake/Output   Yesterday:  04/11 0701 - 04/12 0700 In: 2902.1 [P.O.:1320; I.V.:1582.1] Out: 4365 [Urine:3550; Drains:315; Stool:500] This shift:  Total I/O In: -  Out: 400 [Urine:250; Stool:150]  Bowel function:  Flatus: y  BM: n  Drain: n/a  Physical Exam:  General: Pt awake/alert/oriented x4 in no acute distress Eyes: PERRL, normal EOM.  Sclera clear.  No icterus Neuro: CN II-XII intact w/o focal sensory/motor deficits. Lymph: No head/neck/groin lymphadenopathy Psych:  No delerium/psychosis/paranoia HENT: Normocephalic, Mucus membranes moist.  No thrush Neck: Supple, No tracheal deviation Chest: No chest wall pain w good excursion CV:  Pulses intact.  Regular rhythm MS: Normal AROM mjr joints.  No obvious deformity Abdomen: Soft.  Nondistended.  Mildly tender at incisions only.  No evidence of peritonitis.  No incarcerated hernias. Ext:  SCDs BLE.  No mjr edema.  No cyanosis Skin: No petechiae / purpura   Problem List:   Principal Problem:   Malignant neoplasm of rectum Active Problems:   Grief reaction   Ileostomy in place   Assessment  Jacob [name]  42 y.o. male  2 Days Post-Op  Procedure(s): LAPAROSCOPIC LOW ANTERIOR  RESECTION, mobilization splenic flexure,coloanal anastomosis,diverting ileostomy  Stabilizing.  Ileus resolving   Plan:  -try fulls -stoma care -continue drain for now -anxiolysis -HTN - PRN Bblocker -VTE prophylaxis- SCDs, etc -mobilize as tolerated to help recovery  Ardeth Sportsman, M.D., F.A.C.S. Gastrointestinal and Minimally Invasive Surgery [facility] [address], [address] [place], [place] [address] [phone] Main / Paging   07/01/2012   Results:   Labs: Results for orders placed during the hospital encounter of 06/29/12 (from the past 48 hour(s))  BASIC METABOLIC PANEL     Status: Abnormal   Collection Time    06/30/12  4:17 AM      Result Value Range   Sodium 139  135 - 145 mEq/L   Potassium 3.6  3.5 - 5.1 mEq/L   Chloride 103  96 - 112 mEq/L   CO2 31  19 - 32 mEq/L   Glucose, Bld 115 (*) 70 - 99 mg/dL   BUN 12  6 - 23 mg/dL   Creatinine, Ser 8.65  0.50 - 1.35 mg/dL   Calcium 8.7  8.4 - 78.4 mg/dL   GFR calc non Af Amer >90  >90 mL/min   GFR calc Af Amer >90  >90 mL/min   Comment:            The eGFR has been calculated     using the CKD EPI equation.     This calculation has not been  validated in all clinical     situations.     eGFR's persistently     <90 mL/min signify     possible Chronic Kidney Disease.  CBC     Status: Abnormal   Collection Time    06/30/12  4:17 AM      Result Value Range   WBC 8.7  4.0 - 10.5 K/uL   RBC 3.57 (*) 4.22 - 5.81 MIL/uL   Hemoglobin 11.6 (*) 13.0 - 17.0 g/dL   HCT 14.7 (*) 82.9 - 56.2 %   MCV 92.7  78.0 - 100.0 fL   MCH 32.5  26.0 - 34.0 pg   MCHC 35.0  30.0 - 36.0 g/dL   RDW 13.0  86.5 - 78.4 %   Platelets 193  150 - 400 K/uL  BASIC METABOLIC PANEL     Status: Abnormal   Collection Time    07/01/12  5:24 AM      Result Value Range   Sodium 141  135 - 145 mEq/L   Potassium 3.3 (*) 3.5 - 5.1 mEq/L   Chloride 103  96 - 112 mEq/L   CO2 33 (*) 19 - 32 mEq/L   Glucose,  Bld 98  70 - 99 mg/dL   BUN 8  6 - 23 mg/dL   Creatinine, Ser 6.96  0.50 - 1.35 mg/dL   Calcium 8.7  8.4 - 29.5 mg/dL   GFR calc non Af Amer >90  >90 mL/min   GFR calc Af Amer >90  >90 mL/min   Comment:            The eGFR has been calculated     using the CKD EPI equation.     This calculation has not been     validated in all clinical     situations.     eGFR's persistently     <90 mL/min signify     possible Chronic Kidney Disease.  CBC     Status: Abnormal   Collection Time    07/01/12  5:24 AM      Result Value Range   WBC 6.9  4.0 - 10.5 K/uL   RBC 3.77 (*) 4.22 - 5.81 MIL/uL   Hemoglobin 12.1 (*) 13.0 - 17.0 g/dL   HCT 28.4 (*) 13.2 - 44.0 %   MCV 94.2  78.0 - 100.0 fL   MCH 32.1  26.0 - 34.0 pg   MCHC 34.1  30.0 - 36.0 g/dL   RDW 10.2  72.5 - 36.6 %   Platelets 194  150 - 400 K/uL    Imaging / Studies: No results found.  Medications / Allergies: per chart  Antibiotics: Anti-infectives   Start     Dose/Rate Route Frequency Ordered Stop   06/29/12 2000  cefoTEtan (CEFOTAN) 2 g in dextrose 5 % 50 mL IVPB     2 g 100 mL/hr over 30 Minutes Intravenous Every 12 hours 06/29/12 1609 06/29/12 2027   06/29/12 0535  cefoTEtan (CEFOTAN) 2 g in dextrose 5 % 50 mL IVPB     2 g 100 mL/hr over 30 Minutes Intravenous On call to O.R. 06/29/12 4403 06/29/12 4742

## 2012-07-02 LAB — CBC
HCT: 35.8 % — ABNORMAL LOW (ref 39.0–52.0)
Hemoglobin: 12.2 g/dL — ABNORMAL LOW (ref 13.0–17.0)
RDW: 13.1 % (ref 11.5–15.5)
WBC: 7.2 10*3/uL (ref 4.0–10.5)

## 2012-07-02 LAB — BASIC METABOLIC PANEL
Chloride: 103 mEq/L (ref 96–112)
GFR calc Af Amer: >90 mL/min (ref >90)
Potassium: 3.9 mEq/L (ref 3.5–5.1)
Sodium: 141 mEq/L (ref 135–145)

## 2012-07-02 MED ORDER — KETOROLAC TROMETHAMINE 30 MG/ML IJ SOLN
30.0000 mg | Freq: Four times a day (QID) | INTRAMUSCULAR | Status: AC
  Administered 2012-07-02: 30 mg via INTRAVENOUS
  Filled 2012-07-02 (×6): qty 1

## 2012-07-02 MED ORDER — HYDROCODONE-ACETAMINOPHEN 5-325 MG PO TABS
1.0000 | ORAL_TABLET | ORAL | Status: DC | PRN
Start: 2012-07-02 — End: 2012-07-04
  Administered 2012-07-02 – 2012-07-04 (×8): 2 via ORAL
  Administered 2012-07-04: 1 via ORAL
  Administered 2012-07-04: 2 via ORAL
  Filled 2012-07-02 (×11): qty 2

## 2012-07-02 NOTE — Consult Note (Signed)
WOC ostomy consult  Stoma type/location: RLQ loop ileostomy Stomal assessment/size: 1 and 3/8 inches with red rubber catheter "bridge" intact Peristomal assessment: Not seen today Treatment options for stomal/peristomal skin: None indicated Output Thin brown effluent Ostomy pouching: 2pc. , 2 and 1/4 inches system intact Education provided: Sister and mother with patient for teaching session.  Demonstration of pouch emptying provided using toilet paper "cigars" or "wicks".  Questions answered about bathing, sleeping, and sick-day rules. Patient expressed initial concern about his incontinent episode from the rectum last evening; again taught that this was not unusual.  Seems calmer today.  I will see again tomorrow and continue to follow with you. Thanks, Ladona Mow, MSN, RN, [facility], CWOCN [phone])

## 2012-07-02 NOTE — Plan of Care (Signed)
Problem: Phase II Progression Outcomes Goal: Progressing with IS, TCDB Outcome: Not Met (add Reason) Pt unable related to deficients from stroke

## 2012-07-02 NOTE — Progress Notes (Signed)
General Surgery Note  LOS: 3 days  POD -  3 Days Post-Op Room - 1525  Assessment/Plan: 1 LAPAROSCOPIC LOW ANTERIOR RESECTION, mobilization splenic flexure,coloanal anastomosis,diverting ileostomy - 06/29/2012 - A. Thomas  On full liquids, but feels a little gassy.  Will leave on current diet.  Progressing well.   2. DVT prophylaxis - Lovenox 3.  Hypokalemia - better 4.  Hypertension  Subjective:  Doing okay.  He's not quite sure about the diet. Not ready for reg food.  He had a "gush" from his rectum, but I reassured him.  Wife in room.  Objective:   Filed Vitals:   07/02/12 0801  BP:   Pulse:   Temp:   Resp: 2     Intake/Output from previous day:  04/12 0701 - 04/13 0700 In: 1229.2 [I.V.:1229.2] Out: 3510 [Urine:2590; Drains:170; Stool:750]  Intake/Output this shift:      Physical Exam:   General: WN WM who is alert and oriented. He's somewhat anxious.   HEENT: Normal. Pupils equal. .   Lungs: Clear.   Abdomen: Few BS.  Ileostomy bag is full.  LLQ drain bloody.   Wound: Okay.   Lab Results:    Recent Labs  07/01/12 0524 07/02/12 0426  WBC 6.9 7.2  HGB 12.1* 12.2*  HCT 35.5* 35.8*  PLT 194 218    BMET   Recent Labs  07/01/12 0524 07/02/12 0426  NA 141 141  K 3.3* 3.9  CL 103 103  CO2 33* 33*  GLUCOSE 98 101*  BUN 8 8  CREATININE 0.79 0.87  CALCIUM 8.7 8.8    PT/INR  No results found for this basename: LABPROT, INR,  in the last 72 hours  ABG  No results found for this basename: PHART, PCO2, PO2, HCO3,  in the last 72 hours   Studies/Results:  No results found.   Anti-infectives:   Anti-infectives   Start     Dose/Rate Route Frequency Ordered Stop   06/29/12 2000  cefoTEtan (CEFOTAN) 2 g in dextrose 5 % 50 mL IVPB     2 g 100 mL/hr over 30 Minutes Intravenous Every 12 hours 06/29/12 1609 06/29/12 2027   06/29/12 0535  cefoTEtan (CEFOTAN) 2 g in dextrose 5 % 50 mL IVPB     2 g 100 mL/hr over 30 Minutes Intravenous On call to O.R.  06/29/12 0535 06/29/12 0734      Ovidio Kin, MD, FACS Pager: [phone],   [facility] Office: [phone] 07/02/2012

## 2012-07-03 MED ORDER — MORPHINE SULFATE 2 MG/ML IJ SOLN: 2.0000 mg | INTRAMUSCULAR | Status: DC | PRN

## 2012-07-03 NOTE — Progress Notes (Signed)
4 Days Post-Op Laparoscopic hemicolectomy and proctectomy with coloanal anastomosis and diverting ileostomy Subjective: He is doing well.  His Norco appears to be working.  He is urinating well.    Objective: Vital signs in last 24 hours: Temp:  [97.7 F (36.5 C)-98.7 F (37.1 C)] 97.7 F (36.5 C) (04/14 0500) Pulse Rate:  [65-79] 65 (04/14 0500) Resp:  [15-20] 18 (04/14 1058) BP: (128-142)/(83-86) 128/83 mmHg (04/14 0500) SpO2:  [9 %-100 %] 99 % (04/14 1058)   Intake/Output from previous day: 04/13 0701 - 04/14 0700 In: 2128.7 [P.O.:600; I.V.:1528.7] Out: 3140 [Urine:1775; Drains:115; Stool:1250] Intake/Output this shift: Total I/O In: 240 [P.O.:240] Out: 645 [Urine:400; Drains:45; Stool:200]   General appearance: alert and cooperative GI: normal findings: soft, appropriately tender JP: SS fluid Incision: no significant drainage  Lab Results:   Recent Labs  07/01/12 0524 07/02/12 0426  WBC 6.9 7.2  HGB 12.1* 12.2*  HCT 35.5* 35.8*  PLT 194 218   BMET  Recent Labs  07/01/12 0524 07/02/12 0426  NA 141 141  K 3.3* 3.9  CL 103 103  CO2 33* 33*  GLUCOSE 98 101*  BUN 8 8  CREATININE 0.79 0.87  CALCIUM 8.7 8.8   PT/INR No results found for this basename: LABPROT, INR,  in the last 72 hours ABG No results found for this basename: PHART, PCO2, PO2, HCO3,  in the last 72 hours  MEDS, Scheduled . acetaminophen  1,000 mg Oral TID  . enoxaparin (LOVENOX) injection  40 mg Subcutaneous Q24H  . ketorolac  30 mg Intravenous Q6H  . lip balm  1 application Topical BID  . morphine   Intravenous Q4H  . saccharomyces boulardii  250 mg Oral BID  . sodium chloride  3 mL Intravenous Q12H    Studies/Results: No results found.  Assessment: s/p Procedure(s): LAPAROSCOPIC LOW ANTERIOR RESECTION, mobilization splenic flexure,coloanal anastomosis,diverting ileostomy Patient Active Problem List  Diagnosis  . Elevated liver function tests  . Overweight  . Elevated BP   . Sun-damaged skin  . GERD (gastroesophageal reflux disease)  . Malignant neoplasm of rectum  . Grief reaction  . Ileostomy in place    Expected post op course.    Plan: Advance diet to low fiber Saline lock IV Cont DVT prophylaxis Ambulate in hall D/c PCA Will monitor ostomy output on reg diet.  Possible d/c tom or Wed depending on progression.   LOS: 4 days     .Vanita Panda, MD [facility], [place] [phone]   07/03/2012 11:27 AM

## 2012-07-03 NOTE — Consult Note (Signed)
WOC ostomy consult  Education provided: Patient provided return demonstration of ostomy pouch emptying into toilet, cleaning bottom 2 inches of tail closure with toilet paper wicks.Progressing toward discharge. I will see tomorrow for pouch change. Thanks, Ladona Mow, MSN, RN, [facility], CWOCN [phone])

## 2012-07-04 MED ORDER — DIPHENHYDRAMINE HCL 25 MG PO CAPS: 25.0000 mg | ORAL_CAPSULE | Freq: Once | ORAL | Status: DC

## 2012-07-04 MED ORDER — HYDROCODONE-ACETAMINOPHEN 5-325 MG PO TABS: 1.0000 | ORAL_TABLET | ORAL | Status: DC | PRN

## 2012-07-04 NOTE — Progress Notes (Signed)
Dr. Maisie Fus notified regarding patient having itchy rash across upper back. Skin noted to be warm.  New order received for benadryl and recommendation for patient to call office if rash does not get better after discharge.  Francene Finders, RN

## 2012-07-04 NOTE — Care Management (Signed)
[facility] has accepted this referral for [facility] ostomy care /teaching.  Thank you for this referral.  Jacob [name] [phone].

## 2012-07-04 NOTE — Consult Note (Signed)
WOC ostomy consult  Stoma type/location: RLQ loop Ileostomy Stomal assessment/size: 1 and 1/4 inch stoma,pink,  budded, distal limb at 5 o'clock, proximal limb with os at center.  Order received to discontinue red rubber catheter bridge.  Same done without incident. Peristomal assessment: intact, clear.  Hair growth returning. Treatment options for stomal/peristomal skin: none indicated.  Dry powder shave performed to remove hair from parastomal field. Output thickening brown stool. Ostomy pouching: 2pc. 2 and 1/4 inch pouching system with skin barrier ring. Education provided: Dry powder shave, supply ordering, Registered for [employer]. Ready for discharge to the care of his [facility] and family from an ostomy care standpoint .Rash noted at this encounter that covers back and presents in axillae (bilaterally).  RN to phone MD. I will follow if he remains here, but not routinely.  Please re-consult if needed. Thanks, Ladona Mow, MSN, RN, [facility], CWOCN [phone])

## 2012-07-04 NOTE — Discharge Summary (Signed)
Physician Discharge Summary  Patient ID: Jacob [name] MRN: [id number] DOB/AGE: [birth year] 42 y.o.  Admit date: 06/29/2012 Discharge date: 07/04/2012  Admission Diagnoses: rectal cancer  Discharge Diagnoses: same Principal Problem:   Malignant neoplasm of rectum Active Problems:   Grief reaction   Ileostomy in place   Discharged Condition: good  Hospital Course: The patient underwent a LAR and coloanal anastomosis with diverting ileostomy.  His recovery was uneventful.  His foley was removed on POD 2.  His diet was advanced as tolerated.  By POD 5 he was tolerating a reg diet, his pain was controlled with oral narcotics and he was ambulating without difficulty.  His ostomy output was <1 liter per day.    Consults: None  Significant Diagnostic Studies: labs: cbc, chemistries  Treatments: IV hydration and analgesia: Vicodin  Discharge Exam: Blood pressure 120/76, pulse 72, temperature 97.9 F (36.6 C), temperature source Oral, resp. rate 18, height 5\' 10"  (1.778 m), weight 173 lb 14.4 oz (78.881 kg), SpO2 97.00%. General appearance: alert and cooperative Resp: clear to auscultation bilaterally Cardio: regular rate and rhythm GI: soft, nontender Inc: healing well  Disposition: 01-Home or Self Care  Discharge Orders   Future Appointments Provider Department Dept Phone   07/07/2012 9:45 AM Ladene Artist, MD [facility] MEDICAL ONCOLOGY [phone]   07/12/2012 2:55 PM Romie Levee, MD [facility], [place] [phone]   09/14/2012 10:15 AM Bradd Canary, MD [facility] [phone]   Future Orders Complete By Expires     Discharge patient  As directed     Comments:      D/c once seen by ostomy RN and JP and ostomy tube are removed.        Medication List    STOP taking these medications       docusate sodium 100 MG capsule  Commonly known as:  COLACE     PROBIOTIC DAILY PO      TAKE these medications       ALPRAZolam  0.25 MG tablet  Commonly known as:  XANAX  Take 0.25 mg by mouth 2 (two) times daily as needed for sleep or anxiety.     HYDROcodone-acetaminophen 5-325 MG per tablet  Commonly known as:  NORCO/VICODIN  Take 1-2 tablets by mouth every 4 (four) hours as needed.           Follow-up Information   Follow up with Vanita Panda., MD. Schedule an appointment as soon as possible for a visit in 3 weeks.   Contact information:   [address], [address] [place] [address] [phone]       Signed: Vanita Panda 07/04/2012, 8:46 AM

## 2012-07-06 ENCOUNTER — Telehealth: Payer: Self-pay | Admitting: Genetic Counselor

## 2012-07-06 NOTE — Telephone Encounter (Signed)
Revealed negative genetic test results 

## 2012-07-06 NOTE — Telephone Encounter (Signed)
Left good news message on VM about genetic test results

## 2012-07-07 ENCOUNTER — Telehealth: Payer: Self-pay | Admitting: *Deleted

## 2012-07-07 ENCOUNTER — Ambulatory Visit (HOSPITAL_BASED_OUTPATIENT_CLINIC_OR_DEPARTMENT_OTHER): Payer: BC Managed Care – PPO | Admitting: Oncology

## 2012-07-07 ENCOUNTER — Telehealth: Payer: Self-pay | Admitting: Oncology

## 2012-07-07 ENCOUNTER — Encounter: Payer: Self-pay | Admitting: Genetic Counselor

## 2012-07-07 VITALS — BP 141/82 | HR 90 | Temp 98.0°F | Resp 20 | Ht 70.0 in | Wt 174.5 lb

## 2012-07-07 DIAGNOSIS — K625 Hemorrhage of anus and rectum: Secondary | ICD-10-CM

## 2012-07-07 DIAGNOSIS — R748 Abnormal levels of other serum enzymes: Secondary | ICD-10-CM

## 2012-07-07 DIAGNOSIS — C20 Malignant neoplasm of rectum: Secondary | ICD-10-CM

## 2012-07-07 NOTE — Telephone Encounter (Signed)
Per staff phone call and POF I have schedueld appts.  JMW  

## 2012-07-07 NOTE — Telephone Encounter (Signed)
gv and printed appt schedule and avs for pt....MW added tx.Marland KitchenMarland KitchenMarland Kitchen

## 2012-07-07 NOTE — Progress Notes (Signed)
   [facility]    OFFICE PROGRESS NOTE   INTERVAL HISTORY:   He underwent a low anterior resection and coloanal anastomosis with a diverting ileostomy on 06/29/2012. He has recovered uneventfully. He continues hydrocodone for pain. The pathology revealed a moderately differentiated adenocarcinoma extending into the muscularis with negative surgical margins. 23 lymph nodes were negative for metastatic disease.  Objective:  Vital signs in last 24 hours:  Blood pressure 141/82, pulse 90, temperature 98 F (36.7 C), temperature source Oral, resp. rate 20, height 5\' 10"  (1.778 m), weight 174 lb 8 oz (79.153 kg).   Resp: lungs clear bilaterally Cardio: regular rate and rhythm GI: no hepatomegaly, healing surgical incisions Vascular: no leg edema   Medications: I have reviewed the patient's current medications.  Assessment/Plan: 1. Rectal cancer. Partially obstructing mass noted 1-2 cm from the anal verge on a colonoscopy 03/06/2012. Endoscopic ultrasound 03/14/2012 with a 7.5 mm thick, 3.2 cm wide hypoechoic, irregularly bordered mass that clearly passed into and through the muscularis propria layer of the distal rectal wall (uT3); 3 small (largest 7 mm) perirectal lymph nodes. The lymph nodes were all round, discrete, hypoechoic, homogenous; suspicious for malignant involvement (uN1). He began radiation and concurrent Xeloda chemotherapy on 03/20/2012, completed 04/27/2012  -low anterior resection/coloanal anastomosis and diverting ileostomy 06/29/2012 with the final pathology revealing a T2N0 tumor with extensive fibrosis and negative margins 2. Irregular bowel habits/rectal bleeding secondary to #1. 3. Mild elevation of the liver enzymes. Question secondary to hepatic steatosis. 4. Indeterminate 8 mm posterior right liver lesion on the staging CT 03/06/2012. 5. Mildly elevated CEA at 5.7 on 03/06/2012. 6. Radiation erythema at the groin and perineum 7. 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 There appears to have been a significant response to neoadjuvant therapy  With a residual T2 N0 lesion remaining. I discussed adjuvant treatment options with Mr. Texidor and his mother. I explained there is no clear best standard approach for deciding on adjuvant systemic chemotherapy in this setting. We can consider him as having clinical stage III disease and proceed with adjuvant CAPOX. Treatment with single agent capecitabine would be reasonable based on the final pathology. He is young and otherwise healthy. My initial impression is to recommend adjuvant CAPOX. I explained the lack of clear data to support the use of oxaliplatin in patients with resected rectal cancer, but extrapolating from the colon cancer literature there is data to support its use. We reviewed the potential toxicities associated with oxaliplatin including the chance for nausea/vomiting and hematologic toxicity. We reviewed the various types of neuropathy associated with oxaliplatin.  He will be scheduled for a first cycle of CAPOX and an office visit on 07/19/2012.   Thornton Papas, MD  07/07/2012  8:01 PM

## 2012-07-12 ENCOUNTER — Ambulatory Visit (INDEPENDENT_AMBULATORY_CARE_PROVIDER_SITE_OTHER): Payer: BC Managed Care – PPO | Admitting: General Surgery

## 2012-07-12 ENCOUNTER — Encounter (INDEPENDENT_AMBULATORY_CARE_PROVIDER_SITE_OTHER): Payer: Self-pay | Admitting: General Surgery

## 2012-07-12 VITALS — BP 118/82 | HR 84 | Temp 97.2°F | Resp 16 | Ht 70.0 in | Wt 178.0 lb

## 2012-07-12 DIAGNOSIS — C20 Malignant neoplasm of rectum: Secondary | ICD-10-CM

## 2012-07-12 NOTE — Progress Notes (Addendum)
Jacob [name] is a 42 y.o. male who is status post a lap LAR and coloanal anastomosis on 4/10.  He is doing well.  He is off his narcotics.  He is tolerating a diet.  He denies nausea, dehydration or difficulty urinating.  He is drinking well.  He is having some occasional anal pan and drainage.    Objective: Filed Vitals:   07/12/12 1512  BP: 118/82  Pulse: 84  Temp: 97.2 F (36.2 C)  Resp: 16    General appearance: alert and cooperative GI: soft, non-tender; bowel sounds normal; no masses,  no organomegaly  Incision: healing well, no erythema or purulent drainage present   Assessment: s/p  Patient Active Problem List  Diagnosis  . Elevated liver function tests  . Overweight  . Elevated BP  . Sun-damaged skin  . GERD (gastroesophageal reflux disease)  . Malignant neoplasm of rectum  . Grief reaction  . Ileostomy in place    Plan: His pathology showed a T2N0 rectal tumor with negative margins.  He is going to discuss adjuvant chemo options with Dr Truett Perna.   Ok to start adding in fiber to his diet.  Monitor ileostomy output off narcotics.  I will see him back in 3 weeks to check his anastomosis.      Vanita Panda, MD [facility], [place] [phone]   07/12/2012 4:24 PM

## 2012-07-12 NOTE — Patient Instructions (Signed)
Use tylenol or ibuprofen for pain.  Call the office if this does not work.  Come back to the office in 3 weeks.

## 2012-07-19 ENCOUNTER — Other Ambulatory Visit: Payer: Self-pay | Admitting: Oncology

## 2012-07-19 ENCOUNTER — Other Ambulatory Visit: Payer: Self-pay | Admitting: *Deleted

## 2012-07-19 ENCOUNTER — Ambulatory Visit (HOSPITAL_BASED_OUTPATIENT_CLINIC_OR_DEPARTMENT_OTHER): Payer: BC Managed Care – PPO

## 2012-07-19 ENCOUNTER — Other Ambulatory Visit (HOSPITAL_BASED_OUTPATIENT_CLINIC_OR_DEPARTMENT_OTHER): Payer: BC Managed Care – PPO | Admitting: Lab

## 2012-07-19 ENCOUNTER — Ambulatory Visit (HOSPITAL_BASED_OUTPATIENT_CLINIC_OR_DEPARTMENT_OTHER): Payer: BC Managed Care – PPO | Admitting: Oncology

## 2012-07-19 ENCOUNTER — Telehealth: Payer: Self-pay | Admitting: Oncology

## 2012-07-19 VITALS — BP 118/75 | HR 71 | Temp 97.1°F | Resp 18 | Ht 70.0 in | Wt 180.8 lb

## 2012-07-19 DIAGNOSIS — C778 Secondary and unspecified malignant neoplasm of lymph nodes of multiple regions: Secondary | ICD-10-CM

## 2012-07-19 DIAGNOSIS — K625 Hemorrhage of anus and rectum: Secondary | ICD-10-CM

## 2012-07-19 DIAGNOSIS — Z5111 Encounter for antineoplastic chemotherapy: Secondary | ICD-10-CM

## 2012-07-19 DIAGNOSIS — K6289 Other specified diseases of anus and rectum: Secondary | ICD-10-CM

## 2012-07-19 DIAGNOSIS — C20 Malignant neoplasm of rectum: Secondary | ICD-10-CM

## 2012-07-19 LAB — COMPREHENSIVE METABOLIC PANEL (CC13)
Albumin: 3.7 g/dL (ref 3.5–5.0)
BUN: 11.9 mg/dL (ref 7.0–26.0)
CO2: 26 mEq/L (ref 22–29)
Calcium: 9.5 mg/dL (ref 8.4–10.4)
Chloride: 104 mEq/L (ref 98–107)
Creatinine: 0.9 mg/dL (ref 0.7–1.3)
Glucose: 99 mg/dl (ref 70–99)
Potassium: 4.1 mEq/L (ref 3.5–5.1)

## 2012-07-19 LAB — CBC WITH DIFFERENTIAL/PLATELET
Basophils Absolute: 0 10*3/uL (ref 0.0–0.1)
EOS%: 3.2 % (ref 0.0–7.0)
Eosinophils Absolute: 0.2 10*3/uL (ref 0.0–0.5)
HCT: 40 % (ref 38.4–49.9)
HGB: 13.6 g/dL (ref 13.0–17.1)
MCH: 31.6 pg (ref 27.2–33.4)
MCV: 92.8 fL (ref 79.3–98.0)
NEUT#: 3.8 10*3/uL (ref 1.5–6.5)
NEUT%: 75.5 % — ABNORMAL HIGH (ref 39.0–75.0)
RDW: 13 % (ref 11.0–14.6)
lymph#: 0.5 10*3/uL — ABNORMAL LOW (ref 0.9–3.3)

## 2012-07-19 MED ORDER — DEXAMETHASONE SODIUM PHOSPHATE 10 MG/ML IJ SOLN
10.0000 mg | Freq: Once | INTRAMUSCULAR | Status: AC
  Administered 2012-07-19: 10 mg via INTRAVENOUS

## 2012-07-19 MED ORDER — ONDANSETRON 8 MG/50ML IVPB (CHCC)
8.0000 mg | Freq: Once | INTRAVENOUS | Status: AC
  Administered 2012-07-19: 8 mg via INTRAVENOUS

## 2012-07-19 MED ORDER — OXALIPLATIN CHEMO INJECTION 100 MG/20ML
100.0000 mg/m2 | Freq: Once | INTRAVENOUS | Status: AC
  Administered 2012-07-19: 200 mg via INTRAVENOUS
  Filled 2012-07-19: qty 40

## 2012-07-19 MED ORDER — DEXTROSE 5 % IV SOLN
Freq: Once | INTRAVENOUS | Status: AC
  Administered 2012-07-19: 14:00:00 via INTRAVENOUS

## 2012-07-19 MED ORDER — CAPECITABINE 500 MG PO TABS: 2000.0000 mg | ORAL_TABLET | Freq: Two times a day (BID) | ORAL | Status: DC

## 2012-07-19 MED ORDER — HYDROCODONE-ACETAMINOPHEN 5-325 MG PO TABS: 1.0000 | ORAL_TABLET | ORAL | Status: DC | PRN

## 2012-07-19 NOTE — Patient Instructions (Addendum)
PLEASE CALL OFFICE WHEN YOU RECEIVE YOUR XELODA TABLETS *  [facility] Discharge Instructions for Patients Receiving Chemotherapy  Today you received the following chemotherapy agents Oxaliplatin.  To help prevent nausea and vomiting after your treatment, we encourage you to take your nausea medication as prescribed.   If you develop nausea and vomiting that is not controlled by your nausea medication, call the clinic. If it is after clinic hours your family physician or the after hours number for the clinic or go to the Emergency Department.   BELOW ARE SYMPTOMS THAT SHOULD BE REPORTED IMMEDIATELY:  *FEVER GREATER THAN 100.5 F  *CHILLS WITH OR WITHOUT FEVER  NAUSEA AND VOMITING THAT IS NOT CONTROLLED WITH YOUR NAUSEA MEDICATION  *UNUSUAL SHORTNESS OF BREATH  *UNUSUAL BRUISING OR BLEEDING  TENDERNESS IN MOUTH AND THROAT WITH OR WITHOUT PRESENCE OF ULCERS  *URINARY PROBLEMS  *BOWEL PROBLEMS  UNUSUAL RASH Items with * indicate a potential emergency and should be followed up as soon as possible.  One of the nurses will contact you 24 hours after your treatment. Please let the nurse know about any problems that you may have experienced. Feel free to call the clinic you have any questions or concerns. The clinic phone number is [phone].

## 2012-07-19 NOTE — Progress Notes (Signed)
1610 Pt c/o of right arm "hurting on outer forearm"  Pt IV in right hand; good blood returned noted; no swelling/redness.  Re-assessed by Felicita Gage, RN noted good blood return from IV; warm pack applied.  Will continue to monitor.  1620 Pt states "maybe a little better but still hurts"  IV site no swelling or redness noted. Re-positioned off side table; maybe pitching nerve.  Will continue to monitor.  1645  Treatment complete; Pt states "forearm little better but still some discomfort"  Instructed pt that triage RN will be calling for chemo follow-up tomorrow; instructed pt to report if right forearm better or worse.  Pt verbalized understanding.

## 2012-07-19 NOTE — Progress Notes (Signed)
   [facility]    OFFICE PROGRESS NOTE   INTERVAL HISTORY:   He returns as scheduled. The abdominal pain has improved. He is having some difficulty with leakage from the ostomy site. He plans to see the ostomy nurse. He reports the ostomy output alternates as solid and liquid.  Objective:  Vital signs in last 24 hours:  Blood pressure 118/75, pulse 71, temperature 97.1 F (36.2 C), temperature source Oral, resp. rate 18, height 5\' 10"  (1.778 m), weight 180 lb 12.8 oz (82.01 kg).   Resp: Lungs clear bilaterally Cardio: Regular rate and rhythm GI: Healed surgical incisions Vascular: No leg edema   Lab Results:  Lab Results  Component Value Date   WBC 5.0 07/19/2012   HGB 13.6 07/19/2012   HCT 40.0 07/19/2012   MCV 92.8 07/19/2012   PLT 258 07/19/2012   ANC 3.8     Medications: I have reviewed the patient's current medications.  Assessment/Plan: 1. Rectal cancer. Partially obstructing mass noted 1-2 cm from the anal verge on a colonoscopy 03/06/2012. Endoscopic ultrasound 03/14/2012 with a 7.5 mm thick, 3.2 cm wide hypoechoic, irregularly bordered mass that clearly passed into and through the muscularis propria layer of the distal rectal wall (uT3); 3 small (largest 7 mm) perirectal lymph nodes. The lymph nodes were all round, discrete, hypoechoic, homogenous; suspicious for malignant involvement (uN1). He began radiation and concurrent Xeloda chemotherapy on 03/20/2012, completed 04/27/2012  -low anterior resection/coloanal anastomosis and diverting ileostomy 06/29/2012 with the final pathology revealing a T2N0 tumor with extensive fibrosis and negative margins  2. Irregular bowel habits/rectal bleeding secondary to #1. 3. Mild elevation of the liver enzymes. Question secondary to hepatic steatosis. 4. Indeterminate 8 mm posterior right liver lesion on the staging CT 03/06/2012. 5. Mildly elevated CEA at 5.7 on 03/06/2012. 6. Radiation erythema at the groin and  perineum 7. 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 We discussed adjuvant treatment options again today. I present his case at the GI tumor conference last week. He understands there is no clear "standard" way to approach adjuvant therapy in this setting. Some oncologist use oxaliplatin when patients have node-positive disease on initial staging or the final pathology. He understands there is no data to confirm the benefit of adjuvant oxaliplatin in "rectal cancer ". The endoscopist felt the perirectal lymph nodes appeared malignant on the initial EUS.  I have discussed the case with several colleagues and there is no consensus regarding the use of oxaliplatin in this setting. We discussed the risks and potential benefit. We discussed the various types of neuropathy associated with oxaliplatin. He is comfortable proceeding with adjuvant CAPOX. The plan is to begin a first cycle of CAPOX today. He will return for an office visit and cycle 2 in 3 weeks.   Thornton Papas, MD  07/19/2012  12:23 PM

## 2012-07-19 NOTE — Telephone Encounter (Signed)
gv and printed appt sched and avs to pt...emailed MB to add tx

## 2012-07-19 NOTE — Progress Notes (Signed)
Met with patient to assess for needs.  He is having leakage at his stoma site and is requesting a Aeronautical engineer.  He stated he did not have anyone come to his home post-op, but he was told a WOC nurse from [facility] would come.  He presents to [facility] with questions regarding care of ostomy and concerns over skin breakdown secondary to leakage.  This RN notified WOC nurse, Cammie Mcgee who is unavailable to assist.  She suggested patient go to [employer] [employer].  With patient's permission, appointment made at [facility] for tomorrow.  Patient was appreciative of the help.  Patient denies any further needs at present time.

## 2012-07-20 ENCOUNTER — Telehealth: Payer: Self-pay | Admitting: *Deleted

## 2012-07-20 ENCOUNTER — Encounter: Payer: Self-pay | Admitting: *Deleted

## 2012-07-20 DIAGNOSIS — C20 Malignant neoplasm of rectum: Secondary | ICD-10-CM

## 2012-07-20 MED ORDER — CAPECITABINE 500 MG PO TABS: 2000.0000 mg | ORAL_TABLET | Freq: Two times a day (BID) | ORAL | Status: DC

## 2012-07-20 NOTE — Progress Notes (Signed)
RECEIVED A FAX FROM [employer]ERNING A CONFIRMATION OF PRESCRIPTION SHIPMENT FOR XELODA ON 07/19/12.

## 2012-07-20 NOTE — Addendum Note (Signed)
Addended by: Augusto Garbe on: 07/20/2012 04:56 PM   Modules accepted: Orders

## 2012-07-20 NOTE — Telephone Encounter (Signed)
With call patient reports he has received the xeloda and will start taking this this evening when he arrives home.

## 2012-07-20 NOTE — Telephone Encounter (Signed)
Message copied by Augusto Garbe on Thu Jul 20, 2012  3:59 PM ------      Message from: [place], [place] P      Created: Wed Jul 19, 2012  5:02 PM      Regarding: chemo follow-up      Contact: [phone]       1st Oxaliplatin  Dr. Truett Perna.  Please ask about right forearm.  C/o of hurting.  IV was in right hand with good blood return.  Thanks   ------

## 2012-07-20 NOTE — Telephone Encounter (Signed)
Spoke with Jacob [name].  Says he is doing well but had a strange reaction.  Eating and drinking well.  Reports trouble drinking room temperature water and eating chocolate covered raisins.  Reviewed how to cup his hands over mouth to allow warm breaths to warm up this area.  The reaction he describes concerns IV site.  It subsided about 12:00 am but is still sensitive to touch like he has a bruise but there is no bruise there.  IV was started to vein between rt. index and middle finger.  Experienced "bad pain on the outer side of rt arm from wrist to elbow.  And it changes from pain to asleep and tingly.  Last night it even went from my elbow to my shoulder."  Has taken ibuprofen for this "achey, hurt, asleep sensation".  Will notify staff and providers.

## 2012-08-02 ENCOUNTER — Ambulatory Visit (INDEPENDENT_AMBULATORY_CARE_PROVIDER_SITE_OTHER): Payer: BC Managed Care – PPO | Admitting: General Surgery

## 2012-08-02 ENCOUNTER — Encounter (INDEPENDENT_AMBULATORY_CARE_PROVIDER_SITE_OTHER): Payer: Self-pay | Admitting: General Surgery

## 2012-08-02 VITALS — BP 124/82 | HR 88 | Temp 97.2°F | Resp 16 | Ht 70.0 in | Wt 180.0 lb

## 2012-08-02 DIAGNOSIS — C20 Malignant neoplasm of rectum: Secondary | ICD-10-CM

## 2012-08-02 NOTE — Progress Notes (Signed)
Jacob [name] is a 42 y.o. male who is status post a LAR with colo-anal anastomosis and diverting ileostomy on 4/10.  He is doing well.  He had some difficulty with his chemo and would like to discuss continuing this with Dr Truett Perna.  He also had some trouble with his ostomy but he thinks this is getting better.  He complains of several episodes of retrograde ejaculation.  His anal pain is getting better.  Objective: Filed Vitals:   08/02/12 1503  BP: 124/82  Pulse: 88  Temp: 97.2 F (36.2 C)  Resp: 16    General appearance: alert and cooperative GI: soft, non-tender; bowel sounds normal; no masses,  no organomegaly  Incision: healing well Anal: painful, anastomosis intact, unable to pass index finger through   Assessment: s/p  Patient Active Problem List   Diagnosis Date Noted  . Ileostomy in place 07/01/2012  . Grief reaction 06/18/2012  . Malignant neoplasm of rectum 03/17/2012  . GERD (gastroesophageal reflux disease)   . Elevated liver function tests 01/19/2012  . Overweight 01/19/2012  . Elevated BP 01/19/2012  . Sun-damaged skin 01/19/2012    Plan: Cont current management.  I will see him back in 4 weeks.  Hopefully, by then we can make some decisions about timing of his next operation.      Vanita Panda, MD [facility], [place] [phone]   08/02/2012 3:25 PM

## 2012-08-02 NOTE — Patient Instructions (Signed)
Return to office in 4 weeks

## 2012-08-06 ENCOUNTER — Other Ambulatory Visit: Payer: Self-pay | Admitting: Oncology

## 2012-08-08 ENCOUNTER — Encounter: Payer: Self-pay | Admitting: *Deleted

## 2012-08-08 NOTE — Progress Notes (Signed)
RECEIVED A FAXED FROM [employer] [facility]CERNING A CONFIRMATION OF PRESCRIPTION SHIPMENT FOR XELODA ON 08/07/12.

## 2012-08-09 ENCOUNTER — Telehealth: Payer: Self-pay | Admitting: Oncology

## 2012-08-09 ENCOUNTER — Ambulatory Visit (HOSPITAL_BASED_OUTPATIENT_CLINIC_OR_DEPARTMENT_OTHER): Payer: BC Managed Care – PPO

## 2012-08-09 ENCOUNTER — Ambulatory Visit (HOSPITAL_BASED_OUTPATIENT_CLINIC_OR_DEPARTMENT_OTHER): Payer: BC Managed Care – PPO | Admitting: Oncology

## 2012-08-09 ENCOUNTER — Other Ambulatory Visit (HOSPITAL_BASED_OUTPATIENT_CLINIC_OR_DEPARTMENT_OTHER): Payer: BC Managed Care – PPO | Admitting: Lab

## 2012-08-09 VITALS — BP 123/78 | HR 78 | Temp 97.7°F | Resp 19 | Ht 70.0 in | Wt 184.1 lb

## 2012-08-09 DIAGNOSIS — Z5111 Encounter for antineoplastic chemotherapy: Secondary | ICD-10-CM

## 2012-08-09 DIAGNOSIS — C20 Malignant neoplasm of rectum: Secondary | ICD-10-CM

## 2012-08-09 DIAGNOSIS — K6289 Other specified diseases of anus and rectum: Secondary | ICD-10-CM

## 2012-08-09 DIAGNOSIS — K7689 Other specified diseases of liver: Secondary | ICD-10-CM

## 2012-08-09 LAB — COMPREHENSIVE METABOLIC PANEL (CC13)
Albumin: 3.8 g/dL (ref 3.5–5.0)
Alkaline Phosphatase: 52 U/L (ref 40–150)
Calcium: 9.2 mg/dL (ref 8.4–10.4)
Chloride: 109 mEq/L — ABNORMAL HIGH (ref 98–107)
Glucose: 91 mg/dl (ref 70–99)
Potassium: 3.8 mEq/L (ref 3.5–5.1)
Sodium: 142 mEq/L (ref 136–145)
Total Protein: 6.8 g/dL (ref 6.4–8.3)

## 2012-08-09 LAB — CBC WITH DIFFERENTIAL/PLATELET
Basophils Absolute: 0 10*3/uL (ref 0.0–0.1)
EOS%: 3.1 % (ref 0.0–7.0)
Eosinophils Absolute: 0.1 10*3/uL (ref 0.0–0.5)
HCT: 39.9 % (ref 38.4–49.9)
HGB: 13.7 g/dL (ref 13.0–17.1)
MCH: 32 pg (ref 27.2–33.4)
MONO#: 0.8 10*3/uL (ref 0.1–0.9)
NEUT#: 2.3 10*3/uL (ref 1.5–6.5)
NEUT%: 64.6 % (ref 39.0–75.0)
RDW: 14.9 % — ABNORMAL HIGH (ref 11.0–14.6)
WBC: 3.6 10*3/uL — ABNORMAL LOW (ref 4.0–10.3)
lymph#: 0.4 10*3/uL — ABNORMAL LOW (ref 0.9–3.3)

## 2012-08-09 MED ORDER — DEXAMETHASONE SODIUM PHOSPHATE 10 MG/ML IJ SOLN
10.0000 mg | Freq: Once | INTRAMUSCULAR | Status: AC
  Administered 2012-08-09: 10 mg via INTRAVENOUS

## 2012-08-09 MED ORDER — ONDANSETRON 8 MG/50ML IVPB (CHCC)
8.0000 mg | Freq: Once | INTRAVENOUS | Status: AC
  Administered 2012-08-09: 8 mg via INTRAVENOUS

## 2012-08-09 MED ORDER — OXALIPLATIN CHEMO INJECTION 100 MG/20ML
100.0000 mg/m2 | Freq: Once | INTRAVENOUS | Status: AC
  Administered 2012-08-09: 200 mg via INTRAVENOUS
  Filled 2012-08-09: qty 40

## 2012-08-09 MED ORDER — DEXTROSE 5 % IV SOLN
Freq: Once | INTRAVENOUS | Status: AC
  Administered 2012-08-09: 10:00:00 via INTRAVENOUS

## 2012-08-09 MED ORDER — HYDROCODONE-ACETAMINOPHEN 5-325 MG PO TABS: 1.0000 | ORAL_TABLET | ORAL | Status: DC | PRN

## 2012-08-09 NOTE — Addendum Note (Signed)
Addended by: Wandalee Ferdinand on: 08/09/2012 09:56 AM   Modules accepted: Orders

## 2012-08-09 NOTE — Patient Instructions (Signed)
[facility] Discharge Instructions for Patients Receiving Chemotherapy  Today you received the following chemotherapy agents Oxaliplatin To help prevent nausea and vomiting after your treatment, we encourage you to take your nausea medication as prescribed.  If you develop nausea and vomiting that is not controlled by your nausea medication, call the clinic. If it is after clinic hours your family physician or the after hours number for the clinic or go to the Emergency Department.   BELOW ARE SYMPTOMS THAT SHOULD BE REPORTED IMMEDIATELY:  *FEVER GREATER THAN 100.5 F  *CHILLS WITH OR WITHOUT FEVER  NAUSEA AND VOMITING THAT IS NOT CONTROLLED WITH YOUR NAUSEA MEDICATION  *UNUSUAL SHORTNESS OF BREATH  *UNUSUAL BRUISING OR BLEEDING  TENDERNESS IN MOUTH AND THROAT WITH OR WITHOUT PRESENCE OF ULCERS  *URINARY PROBLEMS  *BOWEL PROBLEMS  UNUSUAL RASH Items with * indicate a potential emergency and should be followed up as soon as possible.   Please let the nurse know about any problems that you may have experienced. Feel free to call the clinic you have any questions or concerns. The clinic phone number is [phone].   I have been informed and understand all the instructions given to me. I know to contact the clinic, my physician, or go to the Emergency Department if any problems should occur. I do not have any questions at this time, but understand that I may call the clinic during office hours   should I have any questions or need assistance in obtaining follow up care.    __________________________________________  _____________  __________ Signature of Patient or Authorized Representative            Date                   Time    __________________________________________ Nurse's Signature

## 2012-08-09 NOTE — Progress Notes (Signed)
   [facility]    OFFICE PROGRESS NOTE   INTERVAL HISTORY:   [name] returns as scheduled. He completed a first cycle of CAPOX on 07/19/2012. He reports developing numbness and discomfort in the right arm during the oxaliplatin infusion. Tenderness and numbness at the right arm and hand persisted for approximately 1 week after the oxaliplatin. No erythema, swelling, or palpable cord. He reports an abnormal sensation at the jaw and submandibular areas when chewing on several occasions. No mouth sores, nausea/vomiting, hand/foot erythema or pain, or diarrhea. The abdominal pain has improved. He reports a mild rash at the dorsum of the hands.  Objective:  Vital signs in last 24 hours:  Blood pressure 123/78, pulse 78, temperature 97.7 F (36.5 C), temperature source Oral, resp. rate 19, height 5\' 10"  (1.778 m), weight 184 lb 1.6 oz (83.507 kg).    HEENT: No thrush or ulcers Resp: Lungs clear bilaterally Cardio: Regular rate and rhythm GI: No hepatomegaly, nontender Vascular: No leg edema  Skin: No erythema at the right arm . Mild erythematous rash at the dorsum of the hands bilaterally     Lab Results:  Lab Results  Component Value Date   WBC 3.6* 08/09/2012   HGB 13.7 08/09/2012   HCT 39.9 08/09/2012   MCV 93.2 08/09/2012   PLT 167 08/09/2012   ANC 3.8    Medications: I have reviewed the patient's current medications.  Assessment/Plan: 1. Rectal cancer. Partially obstructing mass noted 1-2 cm from the anal verge on a colonoscopy 03/06/2012. Endoscopic ultrasound 03/14/2012 with a 7.5 mm thick, 3.2 cm wide hypoechoic, irregularly bordered mass that clearly passed into and through the muscularis propria layer of the distal rectal wall (uT3); 3 small (largest 7 mm) perirectal lymph nodes. The lymph nodes were all round, discrete, hypoechoic, homogenous; suspicious for malignant involvement (uN1). He began radiation and concurrent Xeloda chemotherapy on 03/20/2012,  completed 04/27/2012  -low anterior resection/coloanal anastomosis and diverting ileostomy 06/29/2012 with the final pathology revealing a T2N0 tumor with extensive fibrosis and negative margins  -Cycle 1 of adjuvant CAPOX 07/19/2012 2. Irregular bowel habits/rectal bleeding secondary to #1. 3. Mild elevation of the liver enzymes. Question secondary to hepatic steatosis. 4. Indeterminate 8 mm posterior right liver lesion on the staging CT 03/06/2012. 5. Mildly elevated CEA at 5.7 on 03/06/2012. 6. Radiation erythema at the groin and perineum 7. 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 No clinical evidence of thrombophlebitis or extravasation.      Disposition:  He tolerated the first cycle of oxaliplatin well aside from tenderness and numbness at the right arm. The symptoms have resolved. The plan is to proceed with cycle 2 today. He will return for an office visit and cycle 3 in 3 weeks.   Thornton Papas, MD  08/09/2012  9:36 AM

## 2012-08-09 NOTE — Telephone Encounter (Signed)
gv and printed appt sched and avs for pt  °

## 2012-08-09 NOTE — Progress Notes (Signed)
Patient complains of tingling and discomfort in the left arm.Oxaliplatin paused. Positive blood return noted. IV flushes easily. Dr. Truett Perna notified. Warm compress applied. Hold oxailplatin until discomfort subsides per Dr. Truett Perna. Patient verbalizes understanding to the above plan.  1255 Patient states the discomfort in his left arm has eased. Warm pack reapplied. oxaliplatin restarted. Patient verbalized understanding to let this RN know if the increased discomfort returns.  1320 Oxaliplatin finished. Patient states the numbness and tingling is less severe then the last time he had this infusion.

## 2012-08-18 ENCOUNTER — Telehealth (INDEPENDENT_AMBULATORY_CARE_PROVIDER_SITE_OTHER): Payer: Self-pay

## 2012-08-18 NOTE — Telephone Encounter (Signed)
Cordelia Pen from [facility] calling to let Dr Maisie Fus know she has faxed request form for ostomy supplies. She request form be signed and returned asap. Pt running low on supplies. I advised her I will send msg to Dr Maurine Minister nurse to watch for order.

## 2012-08-18 NOTE — Telephone Encounter (Signed)
Form has been signed and faxed. 

## 2012-08-23 ENCOUNTER — Other Ambulatory Visit: Payer: Self-pay | Admitting: *Deleted

## 2012-08-23 DIAGNOSIS — C20 Malignant neoplasm of rectum: Secondary | ICD-10-CM

## 2012-08-23 MED ORDER — CAPECITABINE 500 MG PO TABS: 2000.0000 mg | ORAL_TABLET | Freq: Two times a day (BID) | ORAL | Status: DC

## 2012-08-23 NOTE — Telephone Encounter (Signed)
Refill request rec'd

## 2012-08-27 ENCOUNTER — Other Ambulatory Visit: Payer: Self-pay | Admitting: Oncology

## 2012-08-28 NOTE — Telephone Encounter (Signed)
RECEIVED A FAX FROM [facility] CONCERNING A CONFIRMATION OF A PRESCRIPTION SHIPMENT FOR XELODA ON 08/25/12.

## 2012-08-30 ENCOUNTER — Telehealth: Payer: Self-pay | Admitting: *Deleted

## 2012-08-30 ENCOUNTER — Other Ambulatory Visit: Payer: Self-pay | Admitting: *Deleted

## 2012-08-30 ENCOUNTER — Ambulatory Visit (HOSPITAL_BASED_OUTPATIENT_CLINIC_OR_DEPARTMENT_OTHER): Payer: BC Managed Care – PPO

## 2012-08-30 ENCOUNTER — Ambulatory Visit (HOSPITAL_BASED_OUTPATIENT_CLINIC_OR_DEPARTMENT_OTHER): Payer: BC Managed Care – PPO | Admitting: Oncology

## 2012-08-30 ENCOUNTER — Telehealth: Payer: Self-pay | Admitting: Oncology

## 2012-08-30 ENCOUNTER — Other Ambulatory Visit (HOSPITAL_BASED_OUTPATIENT_CLINIC_OR_DEPARTMENT_OTHER): Payer: BC Managed Care – PPO

## 2012-08-30 VITALS — BP 128/83 | HR 65 | Temp 97.6°F | Resp 18 | Wt 187.5 lb

## 2012-08-30 DIAGNOSIS — C20 Malignant neoplasm of rectum: Secondary | ICD-10-CM

## 2012-08-30 DIAGNOSIS — R748 Abnormal levels of other serum enzymes: Secondary | ICD-10-CM

## 2012-08-30 DIAGNOSIS — R5381 Other malaise: Secondary | ICD-10-CM

## 2012-08-30 DIAGNOSIS — Z5111 Encounter for antineoplastic chemotherapy: Secondary | ICD-10-CM

## 2012-08-30 DIAGNOSIS — R112 Nausea with vomiting, unspecified: Secondary | ICD-10-CM

## 2012-08-30 LAB — COMPREHENSIVE METABOLIC PANEL (CC13)
ALT: 76 U/L — ABNORMAL HIGH (ref 0–55)
AST: 47 U/L — ABNORMAL HIGH (ref 5–34)
Albumin: 3.8 g/dL (ref 3.5–5.0)
Alkaline Phosphatase: 58 U/L (ref 40–150)
Potassium: 3.5 mEq/L (ref 3.5–5.1)
Sodium: 140 mEq/L (ref 136–145)
Total Protein: 6.9 g/dL (ref 6.4–8.3)

## 2012-08-30 LAB — CBC WITH DIFFERENTIAL/PLATELET
EOS%: 2 % (ref 0.0–7.0)
Eosinophils Absolute: 0.1 10*3/uL (ref 0.0–0.5)
MCH: 31.8 pg (ref 27.2–33.4)
MCV: 94.4 fL (ref 79.3–98.0)
MONO%: 28 % — ABNORMAL HIGH (ref 0.0–14.0)
NEUT#: 2.2 10*3/uL (ref 1.5–6.5)
RBC: 4.12 10*6/uL — ABNORMAL LOW (ref 4.20–5.82)
RDW: 17.7 % — ABNORMAL HIGH (ref 11.0–14.6)
lymph#: 0.3 10*3/uL — ABNORMAL LOW (ref 0.9–3.3)

## 2012-08-30 MED ORDER — DEXTROSE 5 % IV SOLN
Freq: Once | INTRAVENOUS | Status: AC
  Administered 2012-08-30: 11:00:00 via INTRAVENOUS

## 2012-08-30 MED ORDER — PALONOSETRON HCL INJECTION 0.25 MG/5ML
0.2500 mg | Freq: Once | INTRAVENOUS | Status: AC
  Administered 2012-08-30: 0.25 mg via INTRAVENOUS

## 2012-08-30 MED ORDER — DEXAMETHASONE SODIUM PHOSPHATE 10 MG/ML IJ SOLN
10.0000 mg | Freq: Once | INTRAMUSCULAR | Status: AC
  Administered 2012-08-30: 10 mg via INTRAVENOUS

## 2012-08-30 MED ORDER — DEXTROSE 5 % IV SOLN
80.0000 mg/m2 | Freq: Once | INTRAVENOUS | Status: AC
  Administered 2012-08-30: 160 mg via INTRAVENOUS
  Filled 2012-08-30: qty 32

## 2012-08-30 MED ORDER — DEXAMETHASONE 4 MG PO TABS: 4.0000 mg | ORAL_TABLET | Freq: Two times a day (BID) | ORAL | Status: DC

## 2012-08-30 NOTE — Progress Notes (Signed)
   [facility]    OFFICE PROGRESS NOTE   INTERVAL HISTORY:   He completed a second cycle CAPOX on 08/09/2012. He again had numbness and pain in the arm following the oxaliplatin infusion. This lasted for several days. He reports cold sensitivity for greater than one week. He had nausea and intermittent vomiting over the week following chemotherapy. He reports profound malaise. Jacob [name] developed a "rash "over the arms after sun exposure.he also noted intermittent diarrhea following chemotherapy, partially improved with Imodium.  Objective:  Vital signs in last 24 hours:  Blood pressure 128/83, pulse 65, temperature 97.6 F (36.4 C), temperature source Oral, resp. rate 18, weight 187 lb 8 oz (85.049 kg).    HEENT: no thrush or ulcers Resp: lungs clear bilaterally Cardio: regular rate and rhythm GI: no hepatomegaly, nontender,  Lower abdomen ileostomy Vascular: no leg edema, small palpable cord at the dorsum of the left hand Neuro:the vibratory sense is intact at the fingertips bilaterally  Skin:mild erythematous rash over the forearm bilaterally   Lab Results:  Lab Results  Component Value Date   WBC 3.5* 08/30/2012   HGB 13.1 08/30/2012   HCT 38.9 08/30/2012   MCV 94.4 08/30/2012   PLT 149 08/30/2012  ANC 2.2    Medications: I have reviewed the patient's current medications.  Assessment/Plan: 1. Rectal cancer. Partially obstructing mass noted 1-2 cm from the anal verge on a colonoscopy 03/06/2012. Endoscopic ultrasound 03/14/2012 with a 7.5 mm thick, 3.2 cm wide hypoechoic, irregularly bordered mass that clearly passed into and through the muscularis propria layer of the distal rectal wall (uT3); 3 small (largest 7 mm) perirectal lymph nodes. The lymph nodes were all round, discrete, hypoechoic, homogenous; suspicious for malignant involvement (uN1). He began radiation and concurrent Xeloda chemotherapy on 03/20/2012, completed 04/27/2012  -low anterior  resection/coloanal anastomosis and diverting ileostomy 06/29/2012 with the final pathology revealing a T2N0 tumor with extensive fibrosis and negative margins  -Cycle 1 of adjuvant CAPOX 07/19/2012  2. Irregular bowel habits/rectal bleeding secondary to #1. 3. Mild elevation of the liver enzymes. Question secondary to hepatic steatosis. 4. Indeterminate 8 mm posterior right liver lesion on the staging CT 03/06/2012. 5. Mildly elevated CEA at 5.7 on 03/06/2012. 6. History of Radiation erythema at the groin and perineum 7. 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 No clinical evidence of thrombophlebitis or extravasation. 9. Delayed nausea following chemotherapy-Decadron prophylaxis will be added with this cycle 10. Diarrhea following chemotherapy-I encouraged him to increase the use of Imodium and contact us if this does not help the diarrhea  Disposition:  He has completed 2 cycles of adjuvant CAPOX. He is reluctant to proceed with additional CAPOX secondary to profound malaise and nausea following the most recent cycle of chemotherapy. After further discussion he decided to proceed with cycle 3 today. He will receive Decadron prophylaxis for nausea and the oxaliplatin will be dose reduced.  Jacob [name] will return for an office visit and chemotherapy in 3 weeks. He will contact us for nausea following this cycle.   Jacob Papas, MD  08/30/2012  8:06 PM

## 2012-08-30 NOTE — Telephone Encounter (Signed)
Left message on voicemail informing pt Decadron Rx has been sent to [facility] pharmacy. Begin 6/12. Take for three days.

## 2012-08-30 NOTE — Patient Instructions (Addendum)
[facility] Discharge Instructions for Patients Receiving Chemotherapy  Today you received the following chemotherapy agents:  Oxaliplatin  To help prevent nausea and vomiting after your treatment, we encourage you to take your nausea medication as ordered per MD.   If you develop nausea and vomiting that is not controlled by your nausea medication, call the clinic.   BELOW ARE SYMPTOMS THAT SHOULD BE REPORTED IMMEDIATELY:  *FEVER GREATER THAN 100.5 F  *CHILLS WITH OR WITHOUT FEVER  NAUSEA AND VOMITING THAT IS NOT CONTROLLED WITH YOUR NAUSEA MEDICATION  *UNUSUAL SHORTNESS OF BREATH  *UNUSUAL BRUISING OR BLEEDING  TENDERNESS IN MOUTH AND THROAT WITH OR WITHOUT PRESENCE OF ULCERS  *URINARY PROBLEMS  *BOWEL PROBLEMS  UNUSUAL RASH Items with * indicate a potential emergency and should be followed up as soon as possible.  Feel free to call the clinic you have any questions or concerns. The clinic phone number is [phone].

## 2012-08-30 NOTE — Telephone Encounter (Signed)
Per staff message and POF I have scheduled appts.  JMW  

## 2012-09-01 ENCOUNTER — Encounter (INDEPENDENT_AMBULATORY_CARE_PROVIDER_SITE_OTHER): Payer: Self-pay | Admitting: General Surgery

## 2012-09-01 ENCOUNTER — Ambulatory Visit (INDEPENDENT_AMBULATORY_CARE_PROVIDER_SITE_OTHER): Payer: BC Managed Care – PPO | Admitting: General Surgery

## 2012-09-01 VITALS — BP 138/88 | HR 68 | Temp 97.3°F | Resp 16 | Ht 70.0 in | Wt 180.8 lb

## 2012-09-01 DIAGNOSIS — C20 Malignant neoplasm of rectum: Secondary | ICD-10-CM

## 2012-09-01 NOTE — Progress Notes (Signed)
Jacob [name] is a 42 y.o. male who is status post a LAR with colo-anal anastomosis and diverting ileostomy on 4/10. He is doing well. He had some difficulty with his chemo in the past, but this last episode was much better.  Marland Kitchen He also had some trouble with his ostomy but he feels this is getting better. He denies any other symptoms.   Objective: Filed Vitals:   09/01/12 1527  BP: 138/88  Pulse: 68  Temp: 97.3 F (36.3 C)  Resp: 16    General appearance: alert and cooperative GI: soft, non-tender; bowel sounds normal; no masses,  no organomegaly  Incision: healing well Anal:healing anastomosis, able to admit the tip of my finger  Assessment: s/p  Patient Active Problem List   Diagnosis Date Noted  . Ileostomy in place 07/01/2012  . Grief reaction 06/18/2012  . Malignant neoplasm of rectum 03/17/2012  . GERD (gastroesophageal reflux disease)   . Elevated liver function tests 01/19/2012  . Overweight 01/19/2012  . Elevated BP 01/19/2012  . Sun-damaged skin 01/19/2012    Plan: Return to office in 1 month.  Hopefully his anastomosis will be soft enough for reversal of his ostomy at that point.    Vanita Panda, MD [facility], [place] [phone]   09/01/2012 4:05 PM

## 2012-09-01 NOTE — Patient Instructions (Signed)
I will see you back in 1 month.

## 2012-09-13 ENCOUNTER — Telehealth: Payer: Self-pay | Admitting: *Deleted

## 2012-09-13 DIAGNOSIS — C20 Malignant neoplasm of rectum: Secondary | ICD-10-CM

## 2012-09-13 MED ORDER — CAPECITABINE 500 MG PO TABS: 2000.0000 mg | ORAL_TABLET | Freq: Two times a day (BID) | ORAL | Status: DC

## 2012-09-13 NOTE — Telephone Encounter (Signed)
[facility] faxed Xeloda refill request.  Request to provider for review.

## 2012-09-13 NOTE — Telephone Encounter (Signed)
Xeloda refill to [facility]

## 2012-09-14 ENCOUNTER — Encounter: Payer: Self-pay | Admitting: Family Medicine

## 2012-09-14 ENCOUNTER — Ambulatory Visit (INDEPENDENT_AMBULATORY_CARE_PROVIDER_SITE_OTHER): Payer: BC Managed Care – PPO | Admitting: Family Medicine

## 2012-09-14 VITALS — BP 112/74 | HR 70 | Temp 97.9°F | Ht 70.0 in | Wt 183.0 lb

## 2012-09-14 DIAGNOSIS — C20 Malignant neoplasm of rectum: Secondary | ICD-10-CM

## 2012-09-14 DIAGNOSIS — F4321 Adjustment disorder with depressed mood: Secondary | ICD-10-CM

## 2012-09-14 DIAGNOSIS — N529 Male erectile dysfunction, unspecified: Secondary | ICD-10-CM

## 2012-09-14 DIAGNOSIS — R7989 Other specified abnormal findings of blood chemistry: Secondary | ICD-10-CM

## 2012-09-14 DIAGNOSIS — R030 Elevated blood-pressure reading, without diagnosis of hypertension: Secondary | ICD-10-CM

## 2012-09-14 MED ORDER — SILDENAFIL CITRATE 50 MG PO TABS: 50.0000 mg | ORAL_TABLET | Freq: Every day | ORAL | Status: DC | PRN

## 2012-09-14 MED ORDER — ALPRAZOLAM 0.25 MG PO TABS: 0.2500 mg | ORAL_TABLET | Freq: Two times a day (BID) | ORAL | Status: DC | PRN

## 2012-09-14 NOTE — Patient Instructions (Signed)
Try Benefiber twice a day for diarrhea  Fatty Liver Fatty liver is the accumulation of fat in liver cells. It is also called hepatosteatosis or steatohepatitis. It is normal for your liver to contain some fat. If fat is more than 5 to 10% of your liver's weight, you have fatty liver.  There are often no symptoms (problems) for years while damage is still occurring. People often learn about their fatty liver when they have medical tests for other reasons. Fat can damage your liver for years or even decades without causing problems. When it becomes severe, it can cause fatigue, weight loss, weakness, and confusion. This makes you more likely to develop more serious liver problems. The liver is the largest organ in the body. It does a lot of work and often gives no warning signs when it is sick until late in a disease. The liver has many important jobs including:  Breaking down foods.  Storing vitamins, iron, and other minerals.  Making proteins.  Making bile for food digestion.  Breaking down many products including medications, alcohol and some poisons. CAUSES  There are a number of different conditions, medications, and poisons that can cause a fatty liver. Eating too many calories causes fat to build up in the liver. Not processing and breaking fats down normally may also cause this. Certain conditions, such as obesity, diabetes, and high triglycerides also cause this. Most fatty liver patients tend to be middle-aged and over weight.  Some causes of fatty liver are:  Alcohol over consumption.  Malnutrition.  Steroid use.  Valproic acid toxicity.  Obesity.  Cushing's syndrome.  Poisons.  Tetracycline in high dosages.  Pregnancy.  Diabetes.  Hyperlipidemia.  Rapid weight loss. Some people develop fatty liver even having none of these conditions. SYMPTOMS  Fatty liver most often causes no problems. This is called asymptomatic.  It can be diagnosed with blood tests and  also by a liver biopsy.  It is one of the most common causes of minor elevations of liver enzymes on routine blood tests.  Specialized Imaging of the liver using ultrasound, CT (computed tomography) scan, or MRI (magnetic resonance imaging) can suggest a fatty liver but a biopsy is needed to confirm it.  A biopsy involves taking a small sample of liver tissue. This is done by using a needle. It is then looked at under a microscope by a specialist. TREATMENT  It is important to treat the cause. Simple fatty liver without a medical reason may not need treatment.  Weight loss, fat restriction, and exercise in overweight patients produces inconsistent results but is worth trying.  Fatty liver due to alcohol toxicity may not improve even with stopping drinking.  Good control of diabetes may reduce fatty liver.  Lower your triglycerides through diet, medication or both.  Eat a balanced, healthy diet.  Increase your physical activity.  Get regular checkups from a liver specialist.  There are no medical or surgical treatments for a fatty liver or NASH, but improving your diet and increasing your exercise may help prevent or reverse some of the damage. PROGNOSIS  Fatty liver may cause no damage or it can lead to an inflammation of the liver. This is, called steatohepatitis. When it is linked to alcohol abuse, it is called alcoholic steatohepatitis. It often is not linked to alcohol. It is then called nonalcoholic steatohepatitis, or NASH. Over time the liver may become scarred and hardened. This condition is called cirrhosis. Cirrhosis is serious and may lead to liver  failure or cancer. NASH is one of the leading causes of cirrhosis. About 10-20% of Americans have fatty liver and a smaller 2-5% has NASH. Document Released: 04/23/2005 Document Revised: 05/31/2011 Document Reviewed: 06/16/2005 [facility] Patient Information 2014 [place], [place].

## 2012-09-14 NOTE — Progress Notes (Signed)
  Subjective:    Patient ID: Jacob [name], male    DOB: 1970-09-14, 42 y.o.   MRN: [id number]  HPI  Presents today for follow up for multiple medical problems.  Rectal Cancer- chemotherapy for last 9 weeks. First 2 cycles difficult with side effects including numbness in arm during infusion, diarrhea, malaise, and jaw pain/dysfunction. Tolerating this third cycle beter with some modifications of the infusion. Will continue approx 1-2 more cycles. Emotionally and physically tolerating colostomy bag better now. Reversal for ileostomy planned following chemo. States he is currently emotionally handling things well with ocasional ups and downs. Does complain of fatigue and cold intolerance to touch and foods.  Reflux- States is stable, no recent flares.  BP- states is well controled. Sexual dysfunction- States is related to chemo, previous rectal surgery, and stress/anxiety. Can get erection but no ejaculate and intensity of orgasms is decreased.    Review of Systems  Constitutional: Positive for fatigue (chemo ). Negative for fever, chills and appetite change.  HENT: Negative for hearing loss, ear pain, nosebleeds, congestion, sore throat, rhinorrhea, sneezing, trouble swallowing, postnasal drip, sinus pressure, tinnitus and ear discharge.   Eyes: Negative for photophobia, pain, discharge, redness, itching and visual disturbance.  Respiratory: Negative.   Cardiovascular: Negative.   Gastrointestinal: Positive for diarrhea (ocassional, chemo related). Negative for nausea, vomiting, abdominal pain, constipation, blood in stool, abdominal distention, anal bleeding and rectal pain.  Endocrine: Positive for cold intolerance.  Genitourinary: Negative for dysuria, hematuria and difficulty urinating.  Allergic/Immunologic: Positive for immunocompromised state.  Neurological: Negative for dizziness, syncope, weakness, light-headedness, numbness and headaches.  Psychiatric/Behavioral: Negative.         Objective:   Physical Exam  Constitutional: He is oriented to person, place, and time and well-developed, well-nourished, and in no distress. No distress.  HENT:  Head: Normocephalic and atraumatic.  Eyes: Conjunctivae are normal.  Neck: Neck supple. No thyromegaly present.  Cardiovascular: Normal rate, regular rhythm and normal heart sounds.   No murmur heard. Pulmonary/Chest: Effort normal and breath sounds normal. No respiratory distress.  Abdominal: He exhibits no distension and no mass. There is no tenderness.  Musculoskeletal: He exhibits no edema.  Neurological: He is alert and oriented to person, place, and time.  Skin: Skin is warm.  Psychiatric: Memory, affect and judgment normal.    Physical Exam  Constitutional: He is oriented to person, place, and time and well-developed, well-nourished, and in no distress. No distress.  HENT:  Head: Normocephalic and atraumatic.  Eyes: Conjunctivae are normal.  Neck: Neck supple. No thyromegaly present.  Cardiovascular: Normal rate, regular rhythm and normal heart sounds.   No murmur heard. Pulmonary/Chest: Effort normal and breath sounds normal. No respiratory distress.  Abdominal: He exhibits no distension and no mass. There is no tenderness.  Musculoskeletal: He exhibits no edema.  Neurological: He is alert and oriented to person, place, and time.  Skin: Skin is warm.  Psychiatric: Memory, affect and judgment normal.   Agree with note, patient interviewed and examined with student.      Assessment & Plan:   Elevated BP Well controlled today, no changes  Malignant neoplasm of rectum Struggled with 1st 2 rounds of chemo but tolerated the 3rd round better so is going to continue  Grief reaction Feels he is stolerating the stress of his condition well at the moment. Has only needed Alprazolam intemittently.  Elevated liver function tests Mild, stable, will continue to monitor

## 2012-09-15 NOTE — Telephone Encounter (Signed)
Faxed confirmation that Xeloda shipped 6/26 for next day delivery.

## 2012-09-17 ENCOUNTER — Other Ambulatory Visit: Payer: Self-pay | Admitting: Oncology

## 2012-09-17 ENCOUNTER — Encounter: Payer: Self-pay | Admitting: Family Medicine

## 2012-09-17 NOTE — Assessment & Plan Note (Signed)
Mild, stable, will continue to monitor 

## 2012-09-17 NOTE — Assessment & Plan Note (Signed)
Feels he is stolerating the stress of his condition well at the moment. Has only needed Alprazolam intemittently.

## 2012-09-17 NOTE — Assessment & Plan Note (Signed)
Well controlled today, no changes 

## 2012-09-17 NOTE — Assessment & Plan Note (Signed)
Struggled with 1st 2 rounds of chemo but tolerated the 3rd round better so is going to continue

## 2012-09-20 ENCOUNTER — Telehealth: Payer: Self-pay | Admitting: Oncology

## 2012-09-20 ENCOUNTER — Ambulatory Visit (HOSPITAL_BASED_OUTPATIENT_CLINIC_OR_DEPARTMENT_OTHER): Payer: BC Managed Care – PPO

## 2012-09-20 ENCOUNTER — Ambulatory Visit: Payer: BC Managed Care – PPO

## 2012-09-20 ENCOUNTER — Ambulatory Visit (HOSPITAL_BASED_OUTPATIENT_CLINIC_OR_DEPARTMENT_OTHER): Payer: BC Managed Care – PPO | Admitting: Oncology

## 2012-09-20 ENCOUNTER — Telehealth: Payer: Self-pay | Admitting: *Deleted

## 2012-09-20 ENCOUNTER — Other Ambulatory Visit (HOSPITAL_BASED_OUTPATIENT_CLINIC_OR_DEPARTMENT_OTHER): Payer: BC Managed Care – PPO

## 2012-09-20 VITALS — BP 111/66 | HR 66 | Temp 97.0°F | Resp 20 | Ht 70.0 in | Wt 183.1 lb

## 2012-09-20 DIAGNOSIS — C20 Malignant neoplasm of rectum: Secondary | ICD-10-CM

## 2012-09-20 DIAGNOSIS — R748 Abnormal levels of other serum enzymes: Secondary | ICD-10-CM

## 2012-09-20 DIAGNOSIS — K625 Hemorrhage of anus and rectum: Secondary | ICD-10-CM

## 2012-09-20 DIAGNOSIS — Z5111 Encounter for antineoplastic chemotherapy: Secondary | ICD-10-CM

## 2012-09-20 LAB — CBC WITH DIFFERENTIAL/PLATELET
BASO%: 0.4 % (ref 0.0–2.0)
Basophils Absolute: 0 10*3/uL (ref 0.0–0.1)
EOS%: 2.5 % (ref 0.0–7.0)
MCH: 33 pg (ref 27.2–33.4)
MCHC: 34.1 g/dL (ref 32.0–36.0)
MCV: 96.9 fL (ref 79.3–98.0)
MONO%: 19.4 % — ABNORMAL HIGH (ref 0.0–14.0)
RBC: 4.18 10*6/uL — ABNORMAL LOW (ref 4.20–5.82)
RDW: 19.8 % — ABNORMAL HIGH (ref 11.0–14.6)

## 2012-09-20 LAB — COMPREHENSIVE METABOLIC PANEL (CC13)
Alkaline Phosphatase: 53 U/L (ref 40–150)
Glucose: 102 mg/dl (ref 70–140)
Sodium: 137 mEq/L (ref 136–145)
Total Bilirubin: 0.31 mg/dL (ref 0.20–1.20)
Total Protein: 6.6 g/dL (ref 6.4–8.3)

## 2012-09-20 MED ORDER — DEXTROSE 5 % IV SOLN
Freq: Once | INTRAVENOUS | Status: AC
  Administered 2012-09-20: 11:00:00 via INTRAVENOUS

## 2012-09-20 MED ORDER — DEXAMETHASONE SODIUM PHOSPHATE 10 MG/ML IJ SOLN
10.0000 mg | Freq: Once | INTRAMUSCULAR | Status: AC
  Administered 2012-09-20: 10 mg via INTRAVENOUS

## 2012-09-20 MED ORDER — OXALIPLATIN CHEMO INJECTION 100 MG/20ML
80.0000 mg/m2 | Freq: Once | INTRAVENOUS | Status: AC
  Administered 2012-09-20: 160 mg via INTRAVENOUS
  Filled 2012-09-20: qty 32

## 2012-09-20 MED ORDER — OXALIPLATIN CHEMO INJECTION 100 MG/20ML: 80.0000 mg/m2 | Freq: Once | INTRAVENOUS | Status: DC

## 2012-09-20 MED ORDER — PALONOSETRON HCL INJECTION 0.25 MG/5ML
0.2500 mg | Freq: Once | INTRAVENOUS | Status: AC
  Administered 2012-09-20: 0.25 mg via INTRAVENOUS

## 2012-09-20 NOTE — Progress Notes (Signed)
   [facility]    OFFICE PROGRESS NOTE   INTERVAL HISTORY:   He completed another cycle of CAPOX beginning on 08/30/2012. He reports a significant improvement in the nausea and neuropathy symptoms following this cycle. Mild diarrhea. No significant hand or foot pain. Improved energy level. He did not develop pain over the arm of the oxaliplatin infusion.  Objective:  Vital signs in last 24 hours:  Blood pressure 111/66, pulse 66, temperature 97 F (36.1 C), temperature source Oral, resp. rate 20, height 5\' 10"  (1.778 m), weight 183 lb 1.6 oz (83.054 kg).    HEENT: no thrush or ulcers Resp: lungs clear bilaterally Cardio: regular rate and rhythm GI: no hepatomegaly, nontender Vascular: no leg edema Neuro:the vibratory sense is intact at the fingertips bilaterally  Skin:minimal erythema over the feet. No skin breakdown. No significant erythema at the hands    Lab Results:  Lab Results  Component Value Date   WBC 2.8* 09/20/2012   HGB 13.8 09/20/2012   HCT 40.5 09/20/2012   MCV 96.9 09/20/2012   PLT 121* 09/20/2012  ANC 1.8    Medications: I have reviewed the patient's current medications.  Assessment/Plan: 1. Rectal cancer. Partially obstructing mass noted 1-2 cm from the anal verge on a colonoscopy 03/06/2012. Endoscopic ultrasound 03/14/2012 with a 7.5 mm thick, 3.2 cm wide hypoechoic, irregularly bordered mass that clearly passed into and through the muscularis propria layer of the distal rectal wall (uT3); 3 small (largest 7 mm) perirectal lymph nodes. The lymph nodes were all round, discrete, hypoechoic, homogenous; suspicious for malignant involvement (uN1). He began radiation and concurrent Xeloda chemotherapy on 03/20/2012, completed 04/27/2012  -low anterior resection/coloanal anastomosis and diverting ileostomy 06/29/2012 with the final pathology revealing a T2N0 tumor with extensive fibrosis and negative margins  -Cycle 1 of adjuvant CAPOX 07/19/2012   2. Irregular bowel habits/rectal bleeding secondary to #1. 3. Mild elevation of the liver enzymes. Question secondary to hepatic steatosis. 4. Indeterminate 8 mm posterior right liver lesion on the staging CT 03/06/2012. 5. Mildly elevated CEA at 5.7 on 03/06/2012. 6. History of Radiation erythema at the groin and perineum 7. 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 No clinical evidence of thrombophlebitis or extravasation. 9. Delayed nausea following chemotherapy-Decadron prophylaxis was added with cycle 3 CAPOX    Disposition:  He tolerated cycle 3 CAPOX with much less toxicity.the plan is to proceed with cycle 4 today. He will again receive Decadron prophylaxis. He will return for an office visit and cycle 5 in 3 weeks.   Thornton Papas, MD  09/20/2012  6:56 PM

## 2012-09-20 NOTE — Telephone Encounter (Signed)
Gave pt apptm for lab, MD and chemo for july 2014

## 2012-09-20 NOTE — Patient Instructions (Signed)
[facility] Discharge Instructions for Patients Receiving Chemotherapy  Today you received the following chemotherapy agents :  Oxaliplatin.  To help prevent nausea and vomiting after your treatment, we encourage you to take your nausea medication as instructed by your physician.   If you develop nausea and vomiting that is not controlled by your nausea medication, call the clinic.   BELOW ARE SYMPTOMS THAT SHOULD BE REPORTED IMMEDIATELY:  *FEVER GREATER THAN 100.5 F  *CHILLS WITH OR WITHOUT FEVER  NAUSEA AND VOMITING THAT IS NOT CONTROLLED WITH YOUR NAUSEA MEDICATION  *UNUSUAL SHORTNESS OF BREATH  *UNUSUAL BRUISING OR BLEEDING  TENDERNESS IN MOUTH AND THROAT WITH OR WITHOUT PRESENCE OF ULCERS  *URINARY PROBLEMS  *BOWEL PROBLEMS  UNUSUAL RASH Items with * indicate a potential emergency and should be followed up as soon as possible.  Feel free to call the clinic you have any questions or concerns. The clinic phone number is [phone].    

## 2012-09-20 NOTE — Telephone Encounter (Signed)
Per staff message and POF I have scheduled appts.  JMW  

## 2012-09-29 ENCOUNTER — Encounter (INDEPENDENT_AMBULATORY_CARE_PROVIDER_SITE_OTHER): Payer: Self-pay | Admitting: General Surgery

## 2012-09-29 ENCOUNTER — Ambulatory Visit (INDEPENDENT_AMBULATORY_CARE_PROVIDER_SITE_OTHER): Payer: BC Managed Care – PPO | Admitting: General Surgery

## 2012-09-29 VITALS — BP 118/82 | HR 80 | Temp 98.2°F | Resp 16 | Ht 70.0 in | Wt 185.0 lb

## 2012-09-29 DIAGNOSIS — C20 Malignant neoplasm of rectum: Secondary | ICD-10-CM

## 2012-09-29 NOTE — Patient Instructions (Signed)
I will see you back in 1 month.

## 2012-09-29 NOTE — Progress Notes (Signed)
Jacob [name] is a 42 y.o. male who is status post a LAR with colo-anal anastomosis and diverting ileostomy on 4/10. He is doing well. He had some difficulty with his chemo in the past, but this is doing better.  His ostomy is working well.  He denies any rectal pain.   Objective:  Filed Vitals:   09/29/12 1633  BP: 118/82  Pulse: 80  Temp: 98.2 F (36.8 C)  Resp: 16   General appearance: alert and cooperative  GI: soft, non-tender; bowel sounds normal; no masses, no organomegaly  Incision: healing well  Anal:healing anastomosis, able to admit the tip of my finger with some discomfort Assessment:  s/p  Patient Active Problem List    Diagnosis  Date Noted   .  Ileostomy in place  07/01/2012   .  Grief reaction  06/18/2012   .  Malignant neoplasm of rectum  03/17/2012   .  GERD (gastroesophageal reflux disease)    .  Elevated liver function tests  01/19/2012   .  Overweight  01/19/2012   .  Elevated BP  01/19/2012   .  Sun-damaged skin  01/19/2012    Plan:  Jacob [name] is a 42 y.o. M with rectal cancer, currently undergoing adjuvant chemotherapy.  He is tolerating this at the time.  His anastomosis is a still a little tight and painful to touch.  He completes his chemo in about a month.  I will see him back at that time and most likely make plans to take down his ostomy.

## 2012-10-08 ENCOUNTER — Other Ambulatory Visit: Payer: Self-pay | Admitting: Oncology

## 2012-10-10 ENCOUNTER — Encounter: Payer: Self-pay | Admitting: *Deleted

## 2012-10-10 NOTE — Progress Notes (Signed)
RECEIVED A FAX FROM [facility] CONCERNING A CONFIRMATION OF PRESCRIPTION SHIPMENT FOR XELODA ON 10/09/12.

## 2012-10-11 ENCOUNTER — Other Ambulatory Visit (HOSPITAL_BASED_OUTPATIENT_CLINIC_OR_DEPARTMENT_OTHER): Payer: BC Managed Care – PPO | Admitting: Lab

## 2012-10-11 ENCOUNTER — Other Ambulatory Visit: Payer: Self-pay | Admitting: *Deleted

## 2012-10-11 ENCOUNTER — Telehealth: Payer: Self-pay | Admitting: Oncology

## 2012-10-11 ENCOUNTER — Ambulatory Visit (HOSPITAL_BASED_OUTPATIENT_CLINIC_OR_DEPARTMENT_OTHER): Payer: BC Managed Care – PPO

## 2012-10-11 ENCOUNTER — Ambulatory Visit (HOSPITAL_BASED_OUTPATIENT_CLINIC_OR_DEPARTMENT_OTHER): Payer: BC Managed Care – PPO | Admitting: Oncology

## 2012-10-11 VITALS — BP 124/71 | HR 70 | Temp 97.9°F | Resp 18 | Ht 70.0 in | Wt 187.8 lb

## 2012-10-11 DIAGNOSIS — R748 Abnormal levels of other serum enzymes: Secondary | ICD-10-CM

## 2012-10-11 DIAGNOSIS — C20 Malignant neoplasm of rectum: Secondary | ICD-10-CM

## 2012-10-11 DIAGNOSIS — Z5111 Encounter for antineoplastic chemotherapy: Secondary | ICD-10-CM

## 2012-10-11 DIAGNOSIS — K6289 Other specified diseases of anus and rectum: Secondary | ICD-10-CM

## 2012-10-11 LAB — COMPREHENSIVE METABOLIC PANEL (CC13)
BUN: 12.7 mg/dL (ref 7.0–26.0)
CO2: 24 mEq/L (ref 22–29)
Creatinine: 0.8 mg/dL (ref 0.7–1.3)
Glucose: 77 mg/dl (ref 70–140)
Total Bilirubin: 0.42 mg/dL (ref 0.20–1.20)

## 2012-10-11 LAB — CBC WITH DIFFERENTIAL/PLATELET
Basophils Absolute: 0 10*3/uL (ref 0.0–0.1)
Eosinophils Absolute: 0.1 10*3/uL (ref 0.0–0.5)
HGB: 13.5 g/dL (ref 13.0–17.1)
LYMPH%: 12.1 % — ABNORMAL LOW (ref 14.0–49.0)
MCV: 100.5 fL — ABNORMAL HIGH (ref 79.3–98.0)
MONO%: 24.3 % — ABNORMAL HIGH (ref 0.0–14.0)
NEUT#: 1.5 10*3/uL (ref 1.5–6.5)
Platelets: 91 10*3/uL — ABNORMAL LOW (ref 140–400)
RBC: 3.93 10*6/uL — ABNORMAL LOW (ref 4.20–5.82)

## 2012-10-11 MED ORDER — DEXTROSE 5 % IV SOLN
Freq: Once | INTRAVENOUS | Status: AC
  Administered 2012-10-11: 11:00:00 via INTRAVENOUS

## 2012-10-11 MED ORDER — PALONOSETRON HCL INJECTION 0.25 MG/5ML
0.2500 mg | Freq: Once | INTRAVENOUS | Status: AC
  Administered 2012-10-11: 0.25 mg via INTRAVENOUS

## 2012-10-11 MED ORDER — DEXTROSE 5 % IV SOLN
80.0000 mg/m2 | Freq: Once | INTRAVENOUS | Status: AC
  Administered 2012-10-11: 160 mg via INTRAVENOUS
  Filled 2012-10-11: qty 32

## 2012-10-11 MED ORDER — DEXAMETHASONE SODIUM PHOSPHATE 10 MG/ML IJ SOLN
10.0000 mg | Freq: Once | INTRAMUSCULAR | Status: AC
  Administered 2012-10-11: 10 mg via INTRAVENOUS

## 2012-10-11 MED ORDER — SODIUM CHLORIDE 0.9 % IV SOLN
INTRAVENOUS | Status: DC
  Administered 2012-10-11: 10:00:00 via INTRAVENOUS

## 2012-10-11 MED ORDER — DEXAMETHASONE 4 MG PO TABS: 4.0000 mg | ORAL_TABLET | Freq: Two times a day (BID) | ORAL | Status: DC

## 2012-10-11 MED ORDER — SODIUM CHLORIDE 0.9 % IJ SOLN
10.0000 mL | INTRAMUSCULAR | Status: DC | PRN
  Filled 2012-10-11: qty 10

## 2012-10-11 NOTE — Progress Notes (Signed)
10:00 OK to treat today per Amy Horton,RN/Dr.Sherill.

## 2012-10-11 NOTE — Progress Notes (Signed)
   [facility]    OFFICE PROGRESS NOTE   INTERVAL HISTORY:   He returns as scheduled. He completed another cycle of CAPOX beginning on 09/20/2012. There was more discomfort at the left arm following this cycle of chemotherapy, but he did not have significant nausea/vomiting. He continues to have malaise. He notes very mild numbness at the hands. He has occasional diarrhea, but the stool is formed at times.  Objective:  Vital signs in last 24 hours:  Blood pressure 124/71, pulse 70, temperature 97.9 F (36.6 C), temperature source Oral, resp. rate 18, height 5\' 10"  (1.778 m), weight 187 lb 12.8 oz (85.186 kg).    HEENT: no thrush or ulcers Resp: lungs clear bilaterally Cardio: regular rate and rhythm GI: no hepatomegaly, nontender Vascular: no leg edema Neuro:no significant loss of vibratory sense at the fingertips bilaterally  Skin:mild erythema at the palms and soles, no skin breakdown    Lab Results:  Lab Results  Component Value Date   WBC 2.4* 10/11/2012   HGB 13.5 10/11/2012   HCT 39.5 10/11/2012   MCV 100.5* 10/11/2012   PLT 91* 10/11/2012   ANC 1.5   Medications: I have reviewed the patient's current medications.  Assessment/Plan: 1. Rectal cancer. Partially obstructing mass noted 1-2 cm from the anal verge on a colonoscopy 03/06/2012. Endoscopic ultrasound 03/14/2012 with a 7.5 mm thick, 3.2 cm wide hypoechoic, irregularly bordered mass that clearly passed into and through the muscularis propria layer of the distal rectal wall (uT3); 3 small (largest 7 mm) perirectal lymph nodes. The lymph nodes were all round, discrete, hypoechoic, homogenous; suspicious for malignant involvement (uN1). He began radiation and concurrent Xeloda chemotherapy on 03/20/2012, completed 04/27/2012  -low anterior resection/coloanal anastomosis and diverting ileostomy 06/29/2012 with the final pathology revealing a T2N0 tumor with extensive fibrosis and negative margins    -Cycle 1 of adjuvant CAPOX 07/19/2012  2. Irregular bowel habits/rectal bleeding secondary to #1. 3. Mild elevation of the liver enzymes. Question secondary to hepatic steatosis. 4. Indeterminate 8 mm posterior right liver lesion on the staging CT 03/06/2012. 5. Mildly elevated CEA at 5.7 on 03/06/2012. 6. History of Radiation erythema at the groin and perineum 7. 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 No clinical evidence of thrombophlebitis or extravasation. 9. Delayed nausea following chemotherapy-Decadron prophylaxis was added with cycle 3 CAPOX-improved 10. Mild neutropenia/thrombocytopenia secondary to chemotherapy-he notes contact us for fever or bleeding   Disposition:  He appears to be tolerating the chemotherapy well. The plan is to proceed with cycle 5 of CAPOX today. This is the final planned cycle of adjuvant therapy. He knows to contact us for symptoms of infection or bleeding. He will return for a nadir CBC.  Mr. Pullin will return for an office visit in approximately one month. He will followup with Dr. Maisie Fus to schedule the ileostomy reversal procedure.   Thornton Papas, MD  10/11/2012  9:57 PM

## 2012-10-11 NOTE — Patient Instructions (Addendum)
[facility] Discharge Instructions for Patients Receiving Chemotherapy  Today you received the following chemotherapy agents Oxaliplatin  To help prevent nausea and vomiting after your treatment, we encourage you to take your nausea medication as Per Dr. Truett Perna.  If you develop nausea and vomiting that is not controlled by your nausea medication, call the clinic.   BELOW ARE SYMPTOMS THAT SHOULD BE REPORTED IMMEDIATELY:  *FEVER GREATER THAN 100.5 F  *CHILLS WITH OR WITHOUT FEVER  NAUSEA AND VOMITING THAT IS NOT CONTROLLED WITH YOUR NAUSEA MEDICATION  *UNUSUAL SHORTNESS OF BREATH  *UNUSUAL BRUISING OR BLEEDING  TENDERNESS IN MOUTH AND THROAT WITH OR WITHOUT PRESENCE OF ULCERS  *URINARY PROBLEMS  *BOWEL PROBLEMS  UNUSUAL RASH Items with * indicate a potential emergency and should be followed up as soon as possible.  Feel free to call the clinic you have any questions or concerns. The clinic phone number is [phone].

## 2012-10-11 NOTE — Telephone Encounter (Signed)
gv and printed appt sched and avs for pt  °

## 2012-10-23 ENCOUNTER — Other Ambulatory Visit (HOSPITAL_BASED_OUTPATIENT_CLINIC_OR_DEPARTMENT_OTHER): Payer: BC Managed Care – PPO | Admitting: Lab

## 2012-10-23 DIAGNOSIS — C20 Malignant neoplasm of rectum: Secondary | ICD-10-CM

## 2012-10-23 LAB — CBC WITH DIFFERENTIAL/PLATELET
Eosinophils Absolute: 0 10*3/uL (ref 0.0–0.5)
HCT: 38.7 % (ref 38.4–49.9)
LYMPH%: 9.3 % — ABNORMAL LOW (ref 14.0–49.0)
MCHC: 35 g/dL (ref 32.0–36.0)
MCV: 102 fL — ABNORMAL HIGH (ref 79.3–98.0)
MONO#: 0.9 10*3/uL (ref 0.1–0.9)
MONO%: 16.6 % — ABNORMAL HIGH (ref 0.0–14.0)
NEUT%: 73.7 % (ref 39.0–75.0)
Platelets: 126 10*3/uL — ABNORMAL LOW (ref 140–400)
WBC: 5.6 10*3/uL (ref 4.0–10.3)

## 2012-10-24 ENCOUNTER — Telehealth: Payer: Self-pay | Admitting: *Deleted

## 2012-10-24 NOTE — Telephone Encounter (Signed)
Xeloda tx complete. Note faxed to [employer] to make them aware.

## 2012-10-26 ENCOUNTER — Encounter (INDEPENDENT_AMBULATORY_CARE_PROVIDER_SITE_OTHER): Payer: Self-pay | Admitting: General Surgery

## 2012-10-26 ENCOUNTER — Ambulatory Visit (INDEPENDENT_AMBULATORY_CARE_PROVIDER_SITE_OTHER): Payer: BC Managed Care – PPO | Admitting: General Surgery

## 2012-10-26 VITALS — BP 126/76 | HR 80 | Temp 98.1°F | Resp 15 | Ht 70.0 in | Wt 187.8 lb

## 2012-10-26 DIAGNOSIS — C20 Malignant neoplasm of rectum: Secondary | ICD-10-CM

## 2012-10-26 NOTE — Progress Notes (Signed)
Jacob [name] is a 42 y.o. male who is here for a follow up visit regarding his rectal cancer.  He is s/p LAR with coloanal anastomosis on 06/29/12.  He has finished his last dose of adjuvant chemotherapy.  He is now ready for ileostomy reversal.    Objective: Filed Vitals:   10/26/12 1643  BP: 126/76  Pulse: 80  Temp: 98.1 F (36.7 C)  Resp: 15    General appearance: alert and cooperative Cardio: regular rate and rhythm GI: soft, non-tender; bowel sounds normal; no masses,  no organomegaly Anal: stricture palpated at anastomosis, dilated with Hagar dilators  Assessment and Plan: Doing well.  Will plan on ostomy closer in about 4 weeks.      Vanita Panda, MD [facility], [place] [phone]

## 2012-10-26 NOTE — Patient Instructions (Addendum)
We will schedule you for ostomy reversal in 4 weeks.

## 2012-10-30 ENCOUNTER — Telehealth: Payer: Self-pay | Admitting: *Deleted

## 2012-10-30 NOTE — Telephone Encounter (Signed)
Per patient call and last office note, treatment for tomorrow canceled.  JMW

## 2012-10-31 ENCOUNTER — Telehealth (INDEPENDENT_AMBULATORY_CARE_PROVIDER_SITE_OTHER): Payer: Self-pay | Admitting: General Surgery

## 2012-10-31 ENCOUNTER — Other Ambulatory Visit (INDEPENDENT_AMBULATORY_CARE_PROVIDER_SITE_OTHER): Payer: Self-pay | Admitting: General Surgery

## 2012-10-31 NOTE — Telephone Encounter (Signed)
Pt called in to get surgery scheduled. No orders in [facility]

## 2012-10-31 NOTE — Telephone Encounter (Signed)
Orders entered

## 2012-11-01 ENCOUNTER — Ambulatory Visit: Payer: BC Managed Care – PPO

## 2012-11-14 ENCOUNTER — Ambulatory Visit (HOSPITAL_BASED_OUTPATIENT_CLINIC_OR_DEPARTMENT_OTHER): Payer: BC Managed Care – PPO | Admitting: Nurse Practitioner

## 2012-11-14 ENCOUNTER — Telehealth: Payer: Self-pay

## 2012-11-14 ENCOUNTER — Other Ambulatory Visit (HOSPITAL_BASED_OUTPATIENT_CLINIC_OR_DEPARTMENT_OTHER): Payer: BC Managed Care – PPO | Admitting: Lab

## 2012-11-14 VITALS — BP 135/75 | HR 89 | Temp 97.4°F | Resp 18 | Ht 70.0 in | Wt 189.7 lb

## 2012-11-14 DIAGNOSIS — C20 Malignant neoplasm of rectum: Secondary | ICD-10-CM

## 2012-11-14 LAB — CBC WITH DIFFERENTIAL/PLATELET
BASO%: 0.4 % (ref 0.0–2.0)
EOS%: 0.6 % (ref 0.0–7.0)
HGB: 13.1 g/dL (ref 13.0–17.1)
MCH: 34.2 pg — ABNORMAL HIGH (ref 27.2–33.4)
MCHC: 33.7 g/dL (ref 32.0–36.0)
MONO#: 0.7 10*3/uL (ref 0.1–0.9)
RDW: 16.1 % — ABNORMAL HIGH (ref 11.0–14.6)
WBC: 4.6 10*3/uL (ref 4.0–10.3)
lymph#: 0.4 10*3/uL — ABNORMAL LOW (ref 0.9–3.3)

## 2012-11-14 MED ORDER — HYDROCODONE-ACETAMINOPHEN 5-325 MG PO TABS: 1.0000 | ORAL_TABLET | ORAL | Status: DC | PRN

## 2012-11-14 NOTE — Progress Notes (Signed)
OFFICE PROGRESS NOTE  Interval history:  [name] returns as scheduled. He completed the fifth and final cycle of CAPOX beginning 10/11/2012. He feels his energy level is improving "every day". He has a good appetite. He denies nausea/vomiting. The consistency of output from the ostomy varies. Typically the output is "thick". He developed some numbness in the fingers following the last cycle of chemotherapy. This is improving. About 2 days after completing the most recent oxaliplatin he "strained his back". He then noted difficulty lifting his feet lasting for 2-3 days. Both the back pain and difficulty lifting his feet have completely resolved. Over the past 4 days he has had some pain at the left heel most noticeable when he gets out of bed first thing in the morning. The discomfort improves as he walks. He denies any redness or skin breakdown.  He reports taking a hydrocodone tablet and Xanax prior to changing the ostomy apparatus.   Objective: Blood pressure 135/75, pulse 89, temperature 97.4 F (36.3 C), temperature source Oral, resp. rate 18, height 5\' 10"  (1.778 m), weight 189 lb 11.2 oz (86.047 kg).  Oropharynx is without thrush or ulceration. Lungs are clear. Regular cardiac rhythm. Abdomen is soft and nontender. No hepatomegaly. Extremities are without edema. Motor strength 5 over 5. Knee DTRs 2+, symmetric. Vibratory sense intact over the fingertips, mildly decreased over the toes. Left heel is nontender. No erythema or skin breakdown.  Lab Results: Lab Results  Component Value Date   WBC 4.6 11/14/2012   HGB 13.1 11/14/2012   HCT 38.9 11/14/2012   MCV 101.6* 11/14/2012   PLT 112* 11/14/2012    Chemistry:    Chemistry      Component Value Date/Time   NA 140 10/11/2012 0823   NA 141 07/02/2012 0426   K 3.5 10/11/2012 0823   K 3.9 07/02/2012 0426   CL 108* 08/30/2012 0903   CL 103 07/02/2012 0426   CO2 24 10/11/2012 0823   CO2 33* 07/02/2012 0426   BUN 12.7 10/11/2012 0823   BUN 8  07/02/2012 0426   CREATININE 0.8 10/11/2012 0823   CREATININE 0.87 07/02/2012 0426      Component Value Date/Time   CALCIUM 9.4 10/11/2012 0823   CALCIUM 8.8 07/02/2012 0426   ALKPHOS 52 10/11/2012 0823   ALKPHOS 49 03/03/2012 1222   AST 60* 10/11/2012 0823   AST 51* 03/03/2012 1222   ALT 83* 10/11/2012 0823   ALT 128* 03/03/2012 1222   BILITOT 0.42 10/11/2012 0823   BILITOT 0.5 03/03/2012 1222       Studies/Results: No results found.  Medications: I have reviewed the patient's current medications.  Assessment/Plan:  1. Rectal cancer. Partially obstructing mass noted 1-2 cm from the anal verge on a colonoscopy 03/06/2012. Endoscopic ultrasound 03/14/2012 with a 7.5 mm thick, 3.2 cm wide hypoechoic, irregularly bordered mass that clearly passed into and through the muscularis propria layer of the distal rectal wall (uT3); 3 small (largest 7 mm) perirectal lymph nodes. The lymph nodes were all round, discrete, hypoechoic, homogenous; suspicious for malignant involvement (uN1). He began radiation and concurrent Xeloda chemotherapy on 03/20/2012, completed 04/27/2012.  -low anterior resection/coloanal anastomosis and diverting ileostomy 06/29/2012 with the final pathology revealing a T2N0 tumor with extensive fibrosis and negative margins  -Cycle 1 of adjuvant CAPOX 07/19/2012; fifth and final cycle of adjuvant CAPOX beginning 10/11/2012.  2. Irregular bowel habits/rectal bleeding secondary to #1. 3. Mild elevation of the liver enzymes. Question secondary to hepatic steatosis. 4. Indeterminate  8 mm posterior right liver lesion on the staging CT 03/06/2012. 5. Mildly elevated CEA at 5.7 on 03/06/2012. 6. History of radiation erythema at the groin and perineum 7. 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 No clinical evidence of  thrombophlebitis or extravasation. 9. Delayed nausea following chemotherapy-Decadron prophylaxis was added with cycle 3 CAPOX-improved 10. Mild neutropenia/thrombocytopenia secondary to chemotherapy-he notes contact us for fever or bleeding 11. Back pain and "difficulty lifting feet" 2 days after receiving oxaliplatin on 10/11/2012. Resolved. Question atypical neurotoxicity. 12. 4 day history of left heel pain.  Disposition-Jacob [name] appears stable. He has completed the course of adjuvant chemotherapy. We will followup on the CEA from today. He is scheduled for ostomy reversal on 12/07/2012. He will return for a followup visit in 3 months. He will contact the office in the interim with any problems.  Plan reviewed with Dr. Truett Perna.  Lonna Cobb ANP/GNP-BC

## 2012-11-14 NOTE — Telephone Encounter (Signed)
gv and pritned appt sched and avs for pt for NOV °

## 2012-11-29 ENCOUNTER — Encounter (HOSPITAL_COMMUNITY)
Admission: RE | Admit: 2012-11-29 | Discharge: 2012-11-29 | Disposition: A | Discharge status: Home / Self Care | Payer: BC Managed Care – PPO | Source: Ambulatory Visit | Attending: General Surgery | Admitting: General Surgery

## 2012-11-29 ENCOUNTER — Encounter (HOSPITAL_COMMUNITY): Payer: Self-pay | Admitting: Pharmacy Technician

## 2012-11-29 ENCOUNTER — Encounter (HOSPITAL_COMMUNITY): Payer: Self-pay

## 2012-11-29 DIAGNOSIS — Z01810 Encounter for preprocedural cardiovascular examination: Secondary | ICD-10-CM | POA: Insufficient documentation

## 2012-11-29 DIAGNOSIS — Z932 Ileostomy status: Secondary | ICD-10-CM | POA: Insufficient documentation

## 2012-11-29 DIAGNOSIS — Z01812 Encounter for preprocedural laboratory examination: Secondary | ICD-10-CM | POA: Insufficient documentation

## 2012-11-29 DIAGNOSIS — C20 Malignant neoplasm of rectum: Secondary | ICD-10-CM | POA: Insufficient documentation

## 2012-11-29 HISTORY — DX: Anesthesia of skin: R20.0

## 2012-11-29 HISTORY — DX: Cardiac arrhythmia, unspecified: I49.9

## 2012-11-29 HISTORY — DX: Pain, unspecified: R52

## 2012-11-29 LAB — CBC
HCT: 40.4 % (ref 39.0–52.0)
Hemoglobin: 14 g/dL (ref 13.0–17.0)
MCHC: 34.7 g/dL (ref 30.0–36.0)
RBC: 4.06 MIL/uL — ABNORMAL LOW (ref 4.22–5.81)
WBC: 4 10*3/uL (ref 4.0–10.5)

## 2012-11-29 LAB — COMPREHENSIVE METABOLIC PANEL
ALT: 87 U/L — ABNORMAL HIGH (ref 0–53)
Alkaline Phosphatase: 63 U/L (ref 39–117)
BUN: 12 mg/dL (ref 6–23)
CO2: 24 mEq/L (ref 19–32)
Chloride: 106 mEq/L (ref 96–112)
GFR calc Af Amer: >90 mL/min (ref >90)
Glucose, Bld: 112 mg/dL — ABNORMAL HIGH (ref 70–99)
Potassium: 3.8 mEq/L (ref 3.5–5.1)
Sodium: 138 mEq/L (ref 135–145)
Total Bilirubin: 0.2 mg/dL — ABNORMAL LOW (ref 0.3–1.2)
Total Protein: 6.6 g/dL (ref 6.0–8.3)

## 2012-11-29 NOTE — Patient Instructions (Signed)
YOUR SURGERY IS SCHEDULED AT [facility]  ON:  Thursday  9/18  REPORT TO [place] [facility] AT:  8:00 AM      PHONE # FOR SHORT STAY IS [phone]  DO NOT EAT OR DRINK ANYTHING AFTER MIDNIGHT THE NIGHT BEFORE YOUR SURGERY.  YOU MAY BRUSH YOUR TEETH, RINSE OUT YOUR MOUTH--BUT NO WATER, NO FOOD, NO CHEWING GUM, NO MINTS, NO CANDIES, NO CHEWING TOBACCO.  PLEASE TAKE THE FOLLOWING MEDICATIONS THE AM OF YOUR SURGERY WITH A FEW SIPS OF WATER:  XANAX   DO NOT BRING VALUABLES, MONEY, CREDIT CARDS.  DO NOT WEAR JEWELRY, MAKE-UP, NAIL POLISH AND NO METAL PINS OR CLIPS IN YOUR HAIR. CONTACT LENS, DENTURES / PARTIALS, GLASSES SHOULD NOT BE WORN TO SURGERY AND IN MOST CASES-HEARING AIDS WILL NEED TO BE REMOVED.  BRING YOUR GLASSES CASE, ANY EQUIPMENT NEEDED FOR YOUR CONTACT LENS. FOR PATIENTS ADMITTED TO THE HOSPITAL--CHECK OUT TIME THE DAY OF DISCHARGE IS 11:00 AM.  ALL INPATIENT ROOMS ARE PRIVATE - WITH BATHROOM, TELEPHONE, TELEVISION AND WIFI INTERNET.                            PLEASE READ OVER ANY  FACT SHEETS THAT YOU WERE GIVEN:  BLOOD TRANSFUSION INFORMATION FAILURE TO FOLLOW THESE INSTRUCTIONS MAY RESULT IN THE CANCELLATION OF YOUR SURGERY.   PATIENT SIGNATURE_________________________________

## 2012-11-29 NOTE — Pre-Procedure Instructions (Signed)
CHEST CT REPORT IN EPIC FROM 03/14/12. EKG WAS DONE TODAY - PREOP AT [facility].

## 2012-12-07 ENCOUNTER — Ambulatory Visit (HOSPITAL_COMMUNITY): Payer: BC Managed Care – PPO | Admitting: Anesthesiology

## 2012-12-07 ENCOUNTER — Encounter (HOSPITAL_COMMUNITY): Payer: Self-pay | Admitting: Anesthesiology

## 2012-12-07 ENCOUNTER — Encounter (HOSPITAL_COMMUNITY)
Admission: RE | Disposition: A | Discharge status: Home / Self Care | Payer: Self-pay | Source: Ambulatory Visit | Attending: General Surgery

## 2012-12-07 ENCOUNTER — Inpatient Hospital Stay (HOSPITAL_COMMUNITY)
Admission: RE | Admit: 2012-12-07 | Discharge: 2012-12-11 | DRG: 152 | Disposition: A | Discharge status: Home / Self Care | Payer: BC Managed Care – PPO | Source: Ambulatory Visit | Attending: General Surgery | Admitting: General Surgery

## 2012-12-07 DIAGNOSIS — Z9221 Personal history of antineoplastic chemotherapy: Secondary | ICD-10-CM

## 2012-12-07 DIAGNOSIS — K219 Gastro-esophageal reflux disease without esophagitis: Secondary | ICD-10-CM | POA: Diagnosis present

## 2012-12-07 DIAGNOSIS — E663 Overweight: Secondary | ICD-10-CM | POA: Diagnosis present

## 2012-12-07 DIAGNOSIS — Z01810 Encounter for preprocedural cardiovascular examination: Secondary | ICD-10-CM

## 2012-12-07 DIAGNOSIS — Z432 Encounter for attention to ileostomy: Secondary | ICD-10-CM

## 2012-12-07 DIAGNOSIS — Z6827 Body mass index (BMI) 27.0-27.9, adult: Secondary | ICD-10-CM

## 2012-12-07 DIAGNOSIS — C20 Malignant neoplasm of rectum: Secondary | ICD-10-CM | POA: Diagnosis present

## 2012-12-07 DIAGNOSIS — K624 Stenosis of anus and rectum: Secondary | ICD-10-CM | POA: Diagnosis present

## 2012-12-07 DIAGNOSIS — F411 Generalized anxiety disorder: Secondary | ICD-10-CM | POA: Diagnosis present

## 2012-12-07 DIAGNOSIS — Z923 Personal history of irradiation: Secondary | ICD-10-CM

## 2012-12-07 DIAGNOSIS — K7689 Other specified diseases of liver: Secondary | ICD-10-CM | POA: Diagnosis present

## 2012-12-07 DIAGNOSIS — Z01812 Encounter for preprocedural laboratory examination: Secondary | ICD-10-CM

## 2012-12-07 HISTORY — PX: ILEOSTOMY CLOSURE: SHX1784

## 2012-12-07 LAB — TYPE AND SCREEN
ABO/RH(D): A NEG
ABO/RH(D): A NEG
Antibody Screen: NEGATIVE

## 2012-12-07 SURGERY — CLOSURE, ILEOSTOMY
Anesthesia: General | Wound class: Clean Contaminated

## 2012-12-07 MED ORDER — ALVIMOPAN 12 MG PO CAPS
12.0000 mg | ORAL_CAPSULE | Freq: Once | ORAL | Status: AC
  Administered 2012-12-07: 12 mg via ORAL
  Filled 2012-12-07: qty 1

## 2012-12-07 MED ORDER — DIPHENHYDRAMINE HCL 50 MG/ML IJ SOLN: 12.5000 mg | Freq: Four times a day (QID) | INTRAMUSCULAR | Status: DC | PRN

## 2012-12-07 MED ORDER — HYDROMORPHONE HCL PF 1 MG/ML IJ SOLN
INTRAMUSCULAR | Status: AC
  Filled 2012-12-07: qty 1

## 2012-12-07 MED ORDER — LACTATED RINGERS IV SOLN: INTRAVENOUS | Status: DC

## 2012-12-07 MED ORDER — CISATRACURIUM BESYLATE (PF) 10 MG/5ML IV SOLN
INTRAVENOUS | Status: DC | PRN
  Administered 2012-12-07: 3 mg via INTRAVENOUS
  Administered 2012-12-07: 10 mg via INTRAVENOUS

## 2012-12-07 MED ORDER — NEOSTIGMINE METHYLSULFATE 1 MG/ML IJ SOLN
INTRAMUSCULAR | Status: DC | PRN
  Administered 2012-12-07: 5 mg via INTRAVENOUS

## 2012-12-07 MED ORDER — MEPERIDINE HCL 50 MG/ML IJ SOLN
6.2500 mg | INTRAMUSCULAR | Status: DC | PRN
  Administered 2012-12-07 (×2): 12.5 mg via INTRAVENOUS

## 2012-12-07 MED ORDER — MORPHINE SULFATE (PF) 1 MG/ML IV SOLN
INTRAVENOUS | Status: DC
Start: 2012-12-07 — End: 2012-12-09
  Administered 2012-12-07 (×2): via INTRAVENOUS
  Administered 2012-12-07: 13.5 mg via INTRAVENOUS
  Administered 2012-12-07: 9 mg via INTRAVENOUS
  Administered 2012-12-08: 4.5 mg via INTRAVENOUS
  Administered 2012-12-08: 18:00:00 via INTRAVENOUS
  Administered 2012-12-08: 3.42 mg via INTRAVENOUS
  Administered 2012-12-08: 7.5 mg via INTRAVENOUS
  Administered 2012-12-08: 6 mg via INTRAVENOUS
  Administered 2012-12-09: 14.56 mg via INTRAVENOUS
  Administered 2012-12-09: 16.5 mg via INTRAVENOUS
  Filled 2012-12-07 (×2): qty 25

## 2012-12-07 MED ORDER — KETOROLAC TROMETHAMINE 15 MG/ML IJ SOLN
15.0000 mg | Freq: Four times a day (QID) | INTRAMUSCULAR | Status: AC
  Administered 2012-12-07 – 2012-12-10 (×12): 15 mg via INTRAVENOUS
  Filled 2012-12-07 (×14): qty 1

## 2012-12-07 MED ORDER — HYDROMORPHONE HCL PF 1 MG/ML IJ SOLN
INTRAMUSCULAR | Status: DC | PRN
  Administered 2012-12-07: 1 mg via INTRAVENOUS

## 2012-12-07 MED ORDER — ONDANSETRON HCL 4 MG/2ML IJ SOLN
INTRAMUSCULAR | Status: DC | PRN
  Administered 2012-12-07: 4 mg via INTRAVENOUS

## 2012-12-07 MED ORDER — KCL IN DEXTROSE-NACL 20-5-0.9 MEQ/L-%-% IV SOLN
INTRAVENOUS | Status: DC
  Administered 2012-12-07: 16:00:00 via INTRAVENOUS
  Administered 2012-12-08: 75 mL/h via INTRAVENOUS
  Administered 2012-12-09: 06:00:00 via INTRAVENOUS
  Filled 2012-12-07 (×4): qty 1000

## 2012-12-07 MED ORDER — LIDOCAINE 5 % EX OINT
TOPICAL_OINTMENT | Freq: Four times a day (QID) | CUTANEOUS | Status: DC | PRN
  Administered 2012-12-09: 03:00:00 via TOPICAL
  Filled 2012-12-07: qty 35.44

## 2012-12-07 MED ORDER — ONDANSETRON HCL 4 MG/2ML IJ SOLN: 4.0000 mg | Freq: Four times a day (QID) | INTRAMUSCULAR | Status: DC | PRN

## 2012-12-07 MED ORDER — NALOXONE HCL 0.4 MG/ML IJ SOLN: 0.4000 mg | INTRAMUSCULAR | Status: DC | PRN

## 2012-12-07 MED ORDER — BIOTENE DRY MOUTH MT LIQD
15.0000 mL | Freq: Two times a day (BID) | OROMUCOSAL | Status: DC
  Administered 2012-12-07 – 2012-12-11 (×8): 15 mL via OROMUCOSAL

## 2012-12-07 MED ORDER — FENTANYL CITRATE 0.05 MG/ML IJ SOLN
INTRAMUSCULAR | Status: DC | PRN
  Administered 2012-12-07 (×3): 50 ug via INTRAVENOUS
  Administered 2012-12-07: 100 ug via INTRAVENOUS

## 2012-12-07 MED ORDER — MORPHINE SULFATE (PF) 1 MG/ML IV SOLN
INTRAVENOUS | Status: AC
  Filled 2012-12-07: qty 25

## 2012-12-07 MED ORDER — DIPHENHYDRAMINE HCL 12.5 MG/5ML PO ELIX: 12.5000 mg | ORAL_SOLUTION | Freq: Four times a day (QID) | ORAL | Status: DC | PRN

## 2012-12-07 MED ORDER — DEXAMETHASONE SODIUM PHOSPHATE 10 MG/ML IJ SOLN
INTRAMUSCULAR | Status: DC | PRN
  Administered 2012-12-07: 10 mg via INTRAVENOUS

## 2012-12-07 MED ORDER — SODIUM CHLORIDE 0.9 % IJ SOLN: 9.0000 mL | INTRAMUSCULAR | Status: DC | PRN

## 2012-12-07 MED ORDER — ACETAMINOPHEN 500 MG PO TABS
1000.0000 mg | ORAL_TABLET | Freq: Four times a day (QID) | ORAL | Status: AC
  Administered 2012-12-07 – 2012-12-08 (×4): 1000 mg via ORAL
  Filled 2012-12-07 (×4): qty 2

## 2012-12-07 MED ORDER — MIDAZOLAM HCL 5 MG/5ML IJ SOLN
INTRAMUSCULAR | Status: DC | PRN
  Administered 2012-12-07: 2 mg via INTRAVENOUS

## 2012-12-07 MED ORDER — KETOROLAC TROMETHAMINE 15 MG/ML IJ SOLN: 15.0000 mg | Freq: Four times a day (QID) | INTRAMUSCULAR | Status: DC | PRN

## 2012-12-07 MED ORDER — MEPERIDINE HCL 50 MG/ML IJ SOLN
INTRAMUSCULAR | Status: AC
  Filled 2012-12-07: qty 1

## 2012-12-07 MED ORDER — HYDROMORPHONE HCL PF 1 MG/ML IJ SOLN
0.2500 mg | INTRAMUSCULAR | Status: DC | PRN
  Administered 2012-12-07 (×4): 0.5 mg via INTRAVENOUS

## 2012-12-07 MED ORDER — CHLORHEXIDINE GLUCONATE 0.12 % MT SOLN
15.0000 mL | Freq: Two times a day (BID) | OROMUCOSAL | Status: DC
  Administered 2012-12-08 – 2012-12-11 (×8): 15 mL via OROMUCOSAL
  Filled 2012-12-07 (×10): qty 15

## 2012-12-07 MED ORDER — GLYCOPYRROLATE 0.2 MG/ML IJ SOLN
INTRAMUSCULAR | Status: DC | PRN
  Administered 2012-12-07: .8 mg via INTRAVENOUS

## 2012-12-07 MED ORDER — ENOXAPARIN SODIUM 40 MG/0.4ML ~~LOC~~ SOLN
40.0000 mg | SUBCUTANEOUS | Status: DC
  Administered 2012-12-08 – 2012-12-10 (×3): 40 mg via SUBCUTANEOUS
  Filled 2012-12-07 (×5): qty 0.4

## 2012-12-07 MED ORDER — INFLUENZA VAC SPLIT QUAD 0.5 ML IM SUSP
0.5000 mL | INTRAMUSCULAR | Status: AC
  Administered 2012-12-08: 0.5 mL via INTRAMUSCULAR
  Filled 2012-12-07 (×2): qty 0.5

## 2012-12-07 MED ORDER — DEXTROSE 5 % IV SOLN
2.0000 g | INTRAVENOUS | Status: AC
  Administered 2012-12-07: 2 g via INTRAVENOUS
  Filled 2012-12-07: qty 2

## 2012-12-07 MED ORDER — PROMETHAZINE HCL 25 MG/ML IJ SOLN: 6.2500 mg | INTRAMUSCULAR | Status: DC | PRN

## 2012-12-07 MED ORDER — ALVIMOPAN 12 MG PO CAPS
12.0000 mg | ORAL_CAPSULE | Freq: Two times a day (BID) | ORAL | Status: DC
  Administered 2012-12-08 (×2): 12 mg via ORAL
  Filled 2012-12-07 (×4): qty 1

## 2012-12-07 MED ORDER — LACTATED RINGERS IV SOLN
INTRAVENOUS | Status: DC | PRN
  Administered 2012-12-07 (×3): via INTRAVENOUS

## 2012-12-07 MED ORDER — ALPRAZOLAM 0.25 MG PO TABS: 0.2500 mg | ORAL_TABLET | Freq: Two times a day (BID) | ORAL | Status: DC | PRN

## 2012-12-07 MED ORDER — PROPOFOL 10 MG/ML IV BOLUS
INTRAVENOUS | Status: DC | PRN
  Administered 2012-12-07: 200 mg via INTRAVENOUS

## 2012-12-07 SURGICAL SUPPLY — 41 items
BLADE EXTENDED COATED 6.5IN (ELECTRODE) ×2 IMPLANT
BLADE HEX COATED 2.75 (ELECTRODE) ×4 IMPLANT
CANISTER SUCTION 2500CC (MISCELLANEOUS) ×2 IMPLANT
CHLORAPREP W/TINT 26ML (MISCELLANEOUS) ×2 IMPLANT
CLOTH BEACON ORANGE TIMEOUT ST (SAFETY) ×2 IMPLANT
COVER MAYO STAND STRL (DRAPES) ×4 IMPLANT
DRAPE LAPAROSCOPIC ABDOMINAL (DRAPES) ×2 IMPLANT
DRAPE LG THREE QUARTER DISP (DRAPES) IMPLANT
DRAPE WARM FLUID 44X44 (DRAPE) ×2 IMPLANT
ELECT REM PT RETURN 9FT ADLT (ELECTROSURGICAL) ×2
ELECTRODE REM PT RTRN 9FT ADLT (ELECTROSURGICAL) ×1 IMPLANT
GLOVE BIOGEL PI IND STRL 7.0 (GLOVE) ×2 IMPLANT
GLOVE BIOGEL PI INDICATOR 7.0 (GLOVE) ×2
GOWN STRL REIN XL XLG (GOWN DISPOSABLE) ×8 IMPLANT
KIT BASIN OR (CUSTOM PROCEDURE TRAY) ×2 IMPLANT
LEGGING LITHOTOMY PAIR STRL (DRAPES) IMPLANT
LIGASURE IMPACT 36 18CM CVD LR (INSTRUMENTS) IMPLANT
NS IRRIG 1000ML POUR BTL (IV SOLUTION) ×4 IMPLANT
PACK GENERAL/GYN (CUSTOM PROCEDURE TRAY) ×2 IMPLANT
RELOAD PROXIMATE 75MM BLUE (ENDOMECHANICALS) ×2 IMPLANT
SPONGE GAUZE 4X4 12PLY (GAUZE/BANDAGES/DRESSINGS) ×2 IMPLANT
SPONGE LAP 18X18 X RAY DECT (DISPOSABLE) IMPLANT
STAPLER GUN LINEAR PROX 60 (STAPLE) ×2 IMPLANT
STAPLER PROXIMATE 75MM BLUE (STAPLE) ×2 IMPLANT
STAPLER VISISTAT 35W (STAPLE) ×2 IMPLANT
SUCTION POOLE TIP (SUCTIONS) ×2 IMPLANT
SUT NOVA NAB DX-16 0-1 5-0 T12 (SUTURE) ×4 IMPLANT
SUT PDS AB 1 CTX 36 (SUTURE) IMPLANT
SUT PDS AB 1 TP1 96 (SUTURE) IMPLANT
SUT PROLENE 2 0 BLUE (SUTURE) IMPLANT
SUT SILK 2 0 (SUTURE) ×2
SUT SILK 2 0 SH CR/8 (SUTURE) ×4 IMPLANT
SUT SILK 2 0SH CR/8 30 (SUTURE) IMPLANT
SUT SILK 2-0 18XBRD TIE 12 (SUTURE) ×2 IMPLANT
SUT SILK 2-0 30XBRD TIE 12 (SUTURE) IMPLANT
SUT SILK 3 0 (SUTURE) ×2
SUT SILK 3 0 SH CR/8 (SUTURE) ×4 IMPLANT
SUT SILK 3-0 18XBRD TIE 12 (SUTURE) ×2 IMPLANT
SUT VIC AB 2-0 CT2 27 (SUTURE) ×2 IMPLANT
TOWEL OR 17X26 10 PK STRL BLUE (TOWEL DISPOSABLE) ×4 IMPLANT
TRAY FOLEY CATH 14FRSI W/METER (CATHETERS) ×2 IMPLANT

## 2012-12-07 NOTE — H&P (Signed)
Jacob [name] is a 42 y.o. male who is here for closure of his protective ostomy. He is s/p LAR with coloanal anastomosis on 06/29/12. He has finished his last dose of adjuvant chemotherapy in July. He is now ready for ileostomy reversal.  He has had some anal stenosis, which we have been gently dilating in the office Past Medical History  Diagnosis Date  . Chicken pox as a child    ?  . Hernia 6 months old  . Allergic state 01/19/2012  . Reflux 01/19/2012  . Elevated liver function tests 01/19/2012  . Overweight(278.02) 01/19/2012  . Preventative health care 01/19/2012  . Elevated BP 01/19/2012  . Sun-damaged skin 01/19/2012  . Migraine headache as a child  . Hx of migraines   . Radiation 03/20/12-04/27/12    Pelvis 50.4 gray Rectal cancer  . Anxiety   . Fatty liver   . GERD (gastroesophageal reflux disease)     NO RECENT PROBLEMS - NO MEDS  . 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 Cancer 03/06/12    rectal bx=Adenocarcinoma  PT HAD RADIATION , CHEMO SURGERY  . Dysrhythmia     HX OF IRREGULAR HB AT TIME OF TONSILLECTOMY - SURGERY WAS NOT DONE-CAN'T REMEMBER ANY OTHER DETAILS- NEVER HAD THE SURGERY.  . Numbness     TOES OF BOTH FEET.  . Pain     LEFT HEEL PAIN -SEVERE ESPECIALLY AFTER SITTING OR LYING DOWN - DIFFICULT TO GET OUT OF BED AND WALK IN THE MORNINGS BECAUSE OF HEEL PAIN   Past Surgical History  Procedure Laterality Date  . Rectal biopsy  03/06/12    distal mass1-2cm from anal verge=Adenocarcinoma  . Eus  03/14/2012    Procedure: LOWER ENDOSCOPIC ULTRASOUND (EUS);  Surgeon: Rachael Fee, MD;  Location: [facility];  Service: Endoscopy;  Laterality: N/A;  . Hernia repair  6 months old    right inguinal repair  . Cyst excision      L EAR AREA  . Laparoscopic low anterior resection N/A 06/29/2012    Procedure: LAPAROSCOPIC LOW ANTERIOR RESECTION, mobilization splenic flexure,coloanal anastomosis,diverting ileostomy;  Surgeon: Romie Levee, MD;  Location:  [facility];  Service: General;  Laterality: N/A;   Current facility-administered medications:cefoTEtan (CEFOTAN) 2 g in dextrose 5 % 50 mL IVPB, 2 g, Intravenous, On Call to OR, Romie Levee, MD Facility-Administered Medications Ordered in Other Encounters: lactated ringers infusion, , , Continuous PRN, Elesa Massed, CRNA Allergies  Allergen Reactions  . Penicillins     Does not recall   Family History  Problem Relation Age of Onset  . Adopted: Yes  . Cancer Father   . Heart disease Father   . Colon cancer Neg Hx    History   Social History  . Marital Status: Married    Spouse Name: N/A    Number of Children: 1  . Years of Education: N/A   Occupational History  . Scultor/Professor     [employer]   Social History Main Topics  . Smoking status: Never Smoker   . Smokeless tobacco: Never Used  . Alcohol Use: Yes     Comment: RARE ALCOHOL  . Drug Use: No     Comment: marijuana  . Sexual Activity: Yes    Partners: Female   Other Topics Concern  . Not on file   Social History Narrative   Married to wife, Berton Lan   Has one 92 year old child   Occupation: Brewing technologist at  [employer]   Review of Systems - General ROS: negative for - chills or fever Respiratory ROS: no cough, shortness of breath, or wheezing Cardiovascular ROS: no chest pain or dyspnea on exertion Gastrointestinal ROS: no abdominal pain, change in bowel habits, or black or bloody stools Genito-Urinary ROS: no dysuria, trouble voiding, or hematuria  Objective:  Filed Vitals:   12/07/12 0803  BP: 136/76  Pulse: 65  Temp: 98.2 F (36.8 C)  Resp: 18    General appearance: alert and cooperative  Cardio: regular rate and rhythm  GI: soft, non-tender; bowel sounds normal; no masses, no organomegaly  Anal: stricture palpated at anastomosis, able to admit tip of index finger  Assessment and Plan:  Jacob [name] is a 42 y.o. M with a distal rectal cancer.  He is s/p resection and coloanal anastomosis with  protective ostomy.  He is ready for his ostomy closure.  I will dilate his anal canal as well, while he is under anesthesia.  We discussed the risks of the procedure, which are mainly bleeding, infection, damage to adjacent structures, leak, incontinence and obstructive constipation.  I believe he understands these risks and agrees to proceed.

## 2012-12-07 NOTE — Transfer of Care (Signed)
Immediate Anesthesia Transfer of Care Note  Patient: Jacob [name]  Procedure(s) Performed: Procedure(s): ILEOSTOMY TAKEDOWN (N/A)  Patient Location: PACU  Anesthesia Type:General  Level of Consciousness: awake, sedated and patient cooperative  Airway & Oxygen Therapy: Patient Spontanous Breathing and Patient connected to face mask oxygen  Post-op Assessment: Report given to PACU RN and Post -op Vital signs reviewed and stable  Post vital signs: Reviewed and stable  Complications: No apparent anesthesia complications

## 2012-12-07 NOTE — Op Note (Signed)
12/07/2012  12:07 PM  PATIENT:  Jacob [name]  42 y.o. male  Patient Care Team: Bradd Canary, MD as PCP - General (Family Medicine) Rachael Fee, MD as Attending Physician (Gastroenterology) Cira Rue, RN as Registered Nurse Ladene Artist, MD as Consulting Physician (Oncology)  PRE-OPERATIVE DIAGNOSIS:  ostomy in place, rectal cancer, anal stricture   POST-OPERATIVE DIAGNOSIS:  ostomy in place, rectal cancer, anal stricture  PROCEDURE:  ILEOSTOMY TAKEDOWN Anal dilation  SURGEON:  Surgeon(s): Romie Levee, MD  ASSISTANT: none   ANESTHESIA:   general  EBL:  Total I/O In: 2900 [I.V.:2900] Out: 175 [Urine:100; Blood:75]  DRAINS: none   SPECIMEN:  Source of Specimen:  ileostomy  DISPOSITION OF SPECIMEN:  PATHOLOGY  COUNTS:  YES  PLAN OF CARE: Admit to inpatient   PATIENT DISPOSITION:  PACU - hemodynamically stable.  INDICATION: this is a 42 year old male who is status post low anterior resection with coloanal anastomosis. It had a protective loop ileostomy for the past few months and is here today for takedown of his ileostomy.  OR FINDINGS: ileostomy in place  DESCRIPTION:  The patient was identified in the preoperative holding area taken to the OR where he was laid supine on the operating room table. General anesthesia was smoothly induced. A Foley catheter was inserted under sterile conditions. A surgical timeout was performed indicating the correct patient, procedure, positioning and preoperative antibiotics. If these are also noted in place prior to the initiation of anesthesia. Once this was completed I began by performing an anal dilation using Hegar dilators from approximately a 14 mm diameter to 18 mm diameter. His anal canal was packed for hemostasis. This was removed at the end of the case. After that the patient's abdomen was prepped and draped in usual sterile fashion. The ileostomy was separated from subcutaneous tissues using a combination of blunt and  sharp dissection. Both iliac artery was used for hemostasis. Once the entire ostomy was freed was elevated out of the abdomen and a portion of healthy small bowel was identified. An anastomosis was created using a GIA blue load stapler. A TA blue load stapler was used to close the common enterotomy channel. The specimen was then sent to pathology as ileostomy. Hemostasis was achieved using 3-0 silk suture ligator is of some mesenteric bleeding. After hemostasis was achieved a 3-0 silk suture was used to place an anti-tension point at the crotch of the staple line. The anastomoses and placed back into the abdomen. The omentum was pulled over the anastomosis. The fascia was then closed using interrupted #1 Novafil sutures. The subcutaneous tissues were re-approximated using a 2-0 Vicryl pursestring suture. The remaining portion of the wound was packed and dressed appropriately. The patient was awakened from anesthesia and sent to the postanesthesia care unit in stable condition. All counts were correct operating staff.

## 2012-12-07 NOTE — Anesthesia Preprocedure Evaluation (Signed)
Anesthesia Evaluation  Patient identified by MRN, date of birth, ID band Patient awake    Reviewed: Allergy & Precautions, H&P , NPO status , Patient's Chart, lab work & pertinent test results  Airway Mallampati: II TM Distance: >3 FB Neck ROM: Full    Dental no notable dental hx.    Pulmonary neg pulmonary ROS,  breath sounds clear to auscultation  Pulmonary exam normal       Cardiovascular negative cardio ROS  Rhythm:Regular Rate:Normal     Neuro/Psych negative neurological ROS  negative psych ROS   GI/Hepatic negative GI ROS, Neg liver ROS,   Endo/Other  negative endocrine ROS  Renal/GU negative Renal ROS  negative genitourinary   Musculoskeletal negative musculoskeletal ROS (+)   Abdominal   Peds negative pediatric ROS (+)  Hematology negative hematology ROS (+)   Anesthesia Other Findings   Reproductive/Obstetrics negative OB ROS                           Anesthesia Physical Anesthesia Plan  ASA: I  Anesthesia Plan: General   Post-op Pain Management:    Induction: Intravenous  Airway Management Planned: Oral ETT  Additional Equipment:   Intra-op Plan:   Post-operative Plan: Extubation in OR  Informed Consent: I have reviewed the patients History and Physical, chart, labs and discussed the procedure including the risks, benefits and alternatives for the proposed anesthesia with the patient or authorized representative who has indicated his/her understanding and acceptance.   Dental advisory given  Plan Discussed with: CRNA  Anesthesia Plan Comments:         Anesthesia Quick Evaluation  

## 2012-12-07 NOTE — Anesthesia Postprocedure Evaluation (Signed)
  Anesthesia Post-op Note  Patient: Jacob [name]  Procedure(s) Performed: Procedure(s) (LRB): ILEOSTOMY TAKEDOWN (N/A)  Patient Location: PACU  Anesthesia Type: General  Level of Consciousness: awake and alert   Airway and Oxygen Therapy: Patient Spontanous Breathing  Post-op Pain: mild  Post-op Assessment: Post-op Vital signs reviewed, Patient's Cardiovascular Status Stable, Respiratory Function Stable, Patent Airway and No signs of Nausea or vomiting  Last Vitals:  Filed Vitals:   12/07/12 1430  BP: 137/65  Pulse: 77  Temp: 36.4 C  Resp: 19    Post-op Vital Signs: stable   Complications: No apparent anesthesia complications

## 2012-12-08 ENCOUNTER — Encounter (HOSPITAL_COMMUNITY): Payer: Self-pay | Admitting: General Surgery

## 2012-12-08 LAB — BASIC METABOLIC PANEL
CO2: 25 mEq/L (ref 19–32)
Chloride: 106 mEq/L (ref 96–112)
Creatinine, Ser: 0.69 mg/dL (ref 0.50–1.35)
GFR calc Af Amer: >90 mL/min (ref >90)
Potassium: 3.6 mEq/L (ref 3.5–5.1)
Sodium: 139 mEq/L (ref 135–145)

## 2012-12-08 LAB — CBC
HCT: 37.4 % — ABNORMAL LOW (ref 39.0–52.0)
MCV: 97.1 fL (ref 78.0–100.0)
RBC: 3.85 MIL/uL — ABNORMAL LOW (ref 4.22–5.81)
RDW: 13.2 % (ref 11.5–15.5)
WBC: 9.5 10*3/uL (ref 4.0–10.5)

## 2012-12-08 NOTE — Progress Notes (Signed)
1 Day Post-Op Reversal of Loop Ileostomy, good uop Subjective: Feels good, min soreness, no nausea  Objective: Vital signs in last 24 hours: Temp:  [97 F (36.1 C)-98.6 F (37 C)] 97.6 F (36.4 C) (09/19 1610) Pulse Rate:  [69-92] 86 (09/19 0608) Resp:  [12-29] 17 (09/19 0800) BP: (111-167)/(62-88) 112/70 mmHg (09/19 0608) SpO2:  [97 %-100 %] 100 % (09/19 0800) Weight:  [188 lb 9.6 oz (85.548 kg)] 188 lb 9.6 oz (85.548 kg) (09/18 1524)   Intake/Output from previous day: 09/18 0701 - 09/19 0700 In: 4560 [I.V.:4560] Out: 4575 [Urine:4500; Blood:75] Intake/Output this shift:   General appearance: alert and cooperative GI: normal findings: soft, non-tender  Incision: wound packed  Lab Results:   Recent Labs  12/08/12 0413  WBC 9.5  HGB 13.1  HCT 37.4*  PLT 140*   BMET  Recent Labs  12/08/12 0413  NA 139  K 3.6  CL 106  CO2 25  GLUCOSE 130*  BUN 11  CREATININE 0.69  CALCIUM 8.7   PT/INR No results found for this basename: LABPROT, INR,  in the last 72 hours ABG No results found for this basename: PHART, PCO2, PO2, HCO3,  in the last 72 hours  MEDS, Scheduled . alvimopan  12 mg Oral BID  . antiseptic oral rinse  15 mL Mouth Rinse BID  . chlorhexidine  15 mL Mouth Rinse BID  . enoxaparin (LOVENOX) injection  40 mg Subcutaneous Q24H  . influenza vac split quadrivalent PF  0.5 mL Intramuscular Tomorrow-1000  . ketorolac  15 mg Intravenous Q6H  . morphine   Intravenous Q4H    Studies/Results: No results found.  Assessment: s/p Procedure(s): ILEOSTOMY TAKEDOWN Patient Active Problem List   Diagnosis Date Noted  . Ileostomy in place 07/01/2012  . Grief reaction 06/18/2012  . Malignant neoplasm of rectum 03/17/2012  . GERD (gastroesophageal reflux disease)   . Elevated liver function tests 01/19/2012  . Overweight 01/19/2012  . Elevated BP 01/19/2012  . Sun-damaged skin 01/19/2012    S/p ileostomy reversal and anal dilation from LAR and coloanal  anastomosis in April  Plan: Advance diet to clears today Decrease IVF's Cont PCA until tolerating clears.  Cont Toradol as well Ambulate Change dressing BID PT has lidocaine ointment and sitz baths prn if needed for anal pain with BM's   LOS: 1 day     .Vanita Panda, MD [facility], [place] [facility], [place] [phone]   12/08/2012 8:44 AM

## 2012-12-08 NOTE — Care Management Note (Signed)
    Page 1 of 1   12/08/2012     10:27:26 AM   CARE MANAGEMENT NOTE 12/08/2012  Patient:  Jacob [name], Jacob [name]   Account Number:  [id number]  Date Initiated:  12/08/2012  Documentation initiated by:  Lorenda Ishihara  Subjective/Objective Assessment:   42 yo male admitted s/p ileostomy takedown. PTA lived at home with spouse.     Action/Plan:   Home when stable   Anticipated DC Date:  12/11/2012   Anticipated DC Plan:  HOME/SELF CARE      DC Planning Services  CM consult      Choice offered to / List presented to:             Status of service:  Completed, signed off Medicare Important Message given?   (If response is "NO", the following Medicare IM given date fields will be blank) Date Medicare IM given:   Date Additional Medicare IM given:    Discharge Disposition:  HOME/SELF CARE  Per UR Regulation:  Reviewed for med. necessity/level of care/duration of stay  If discussed at Long Length of Stay Meetings, dates discussed:    Comments:

## 2012-12-09 LAB — BASIC METABOLIC PANEL
BUN: 9 mg/dL (ref 6–23)
CO2: 27 mEq/L (ref 19–32)
Chloride: 105 mEq/L (ref 96–112)
Creatinine, Ser: 0.7 mg/dL (ref 0.50–1.35)
GFR calc Af Amer: >90 mL/min (ref >90)
Potassium: 3.2 mEq/L — ABNORMAL LOW (ref 3.5–5.1)

## 2012-12-09 LAB — CBC
HCT: 37.6 % — ABNORMAL LOW (ref 39.0–52.0)
Hemoglobin: 12.7 g/dL — ABNORMAL LOW (ref 13.0–17.0)
MCHC: 33.8 g/dL (ref 30.0–36.0)
MCV: 99.2 fL (ref 78.0–100.0)
WBC: 7 10*3/uL (ref 4.0–10.5)

## 2012-12-09 MED ORDER — POTASSIUM CHLORIDE CRYS ER 20 MEQ PO TBCR
40.0000 meq | EXTENDED_RELEASE_TABLET | Freq: Once | ORAL | Status: AC
  Administered 2012-12-09: 40 meq via ORAL
  Filled 2012-12-09: qty 2

## 2012-12-09 MED ORDER — MORPHINE SULFATE 2 MG/ML IJ SOLN
2.0000 mg | INTRAMUSCULAR | Status: DC | PRN
  Administered 2012-12-09 – 2012-12-10 (×2): 2 mg via INTRAVENOUS
  Filled 2012-12-09 (×2): qty 1

## 2012-12-09 MED ORDER — OXYCODONE HCL 5 MG PO TABS
5.0000 mg | ORAL_TABLET | ORAL | Status: DC | PRN
  Administered 2012-12-09 – 2012-12-11 (×8): 10 mg via ORAL
  Filled 2012-12-09 (×8): qty 2

## 2012-12-09 MED ORDER — POTASSIUM CHLORIDE 10 MEQ/100ML IV SOLN
10.0000 meq | INTRAVENOUS | Status: AC
  Administered 2012-12-09: 10 meq via INTRAVENOUS
  Filled 2012-12-09 (×4): qty 100

## 2012-12-09 NOTE — Progress Notes (Signed)
2 Days Post-Op  Subjective: Doing okay.  Bowels loose. Tolerating diet  Objective: Vital signs in last 24 hours: Temp:  [97.8 F (36.6 C)-97.9 F (36.6 C)] 97.9 F (36.6 C) (09/20 0500) Pulse Rate:  [58-77] 77 (09/20 0500) Resp:  [16-21] 17 (09/20 0500) BP: (118-138)/(70-77) 118/70 mmHg (09/20 0500) SpO2:  [96 %-100 %] 96 % (09/20 0500) FiO2 (%):  [35 %-40 %] 40 % (09/20 0411) Last BM Date: 12/07/12  Intake/Output from previous day: 09/19 0701 - 09/20 0700 In: 1564.8 [P.O.:720; I.V.:844.8] Out: 1550 [Urine:1550] Intake/Output this shift:    General appearance: alert, cooperative and no distress Resp: clear to auscultation bilaterally Cardio: normal rate, regular GI: soft, appropriate tenderness, ND, wound open wuth healthy granulation tissue, no peritoneal signs  Lab Results:   Recent Labs  12/08/12 0413 12/09/12 0506  WBC 9.5 7.0  HGB 13.1 12.7*  HCT 37.4* 37.6*  PLT 140* 124*   BMET  Recent Labs  12/08/12 0413 12/09/12 0506  NA 139 139  K 3.6 3.2*  CL 106 105  CO2 25 27  GLUCOSE 130* 99  BUN 11 9  CREATININE 0.69 0.70  CALCIUM 8.7 8.8   PT/INR No results found for this basename: LABPROT, INR,  in the last 72 hours ABG No results found for this basename: PHART, PCO2, PO2, HCO3,  in the last 72 hours  Studies/Results: No results found.  Anti-infectives: Anti-infectives   Start     Dose/Rate Route Frequency Ordered Stop   12/07/12 0809  cefoTEtan (CEFOTAN) 2 g in dextrose 5 % 50 mL IVPB     2 g 100 mL/hr over 30 Minutes Intravenous On call to O.R. 12/07/12 0810 12/07/12 1020      Assessment/Plan: s/p Procedure(s): ILEOSTOMY TAKEDOWN (N/A) Advance diet trial oral pain meds.    LOS: 2 days    Lodema Pilot DAVID 12/09/2012

## 2012-12-09 NOTE — Progress Notes (Signed)
Dr. Biagio Quint aware via phone pt not tolerating IV potassium well. Only received 1 bolus so far running slowly yet requested it be discontiued. See new orders in EPIC per MD to give po potassium and dc runs of K+.

## 2012-12-09 NOTE — Progress Notes (Signed)
Pharmacy Brief Note - Alvimopan (Entereg)  The standing order set for alvimopan (Entereg) now includes an automatic order to discontinue the drug after the patient has had a bowel movement.  The change was approved by the Pharmacy & Therapeutics Committee and the Medical Executive Committee.    This patient has had a bowel movement documented by nursing.  Therefore, alvimopan has been discontinued.  If there are questions, please contact the pharmacy at [phone].  Thank you-  Otho Bellows PharmD

## 2012-12-10 LAB — CBC
HCT: 38.2 % — ABNORMAL LOW (ref 39.0–52.0)
Hemoglobin: 12.9 g/dL — ABNORMAL LOW (ref 13.0–17.0)
MCH: 33.2 pg (ref 26.0–34.0)
MCHC: 33.8 g/dL (ref 30.0–36.0)
RDW: 13.4 % (ref 11.5–15.5)

## 2012-12-10 LAB — BASIC METABOLIC PANEL
BUN: 7 mg/dL (ref 6–23)
Chloride: 103 mEq/L (ref 96–112)
Creatinine, Ser: 0.86 mg/dL (ref 0.50–1.35)
Glucose, Bld: 93 mg/dL (ref 70–99)
Potassium: 3.7 mEq/L (ref 3.5–5.1)

## 2012-12-10 NOTE — Progress Notes (Signed)
Had pt's wife do dressing change using aseptic technique.  Pt's wife did well with instructional cues, still needs to practice to make sure she can do all steps without prompt.  Sherron Monday

## 2012-12-10 NOTE — Progress Notes (Signed)
3 Days Post-Op  Subjective: Tolerating diet.  Bowels slowed some last night   Objective: Vital signs in last 24 hours: Temp:  [97.9 F (36.6 C)-98.2 F (36.8 C)] 98.2 F (36.8 C) (09/21 0515) Pulse Rate:  [64-93] 64 (09/21 0515) Resp:  [16-18] 18 (09/21 0515) BP: (123-143)/(74-83) 143/83 mmHg (09/21 0515) SpO2:  [96 %-98 %] 97 % (09/21 0515) Last BM Date: 12/10/12  Intake/Output from previous day: 09/20 0701 - 09/21 0700 In: 1447 [P.O.:720; I.V.:727] Out: 550 [Urine:550] Intake/Output this shift:    General appearance: alert, cooperative and no distress Resp: nonlabored GI: soft, minimal tenderness, ND, wound looks good  Lab Results:   Recent Labs  12/09/12 0506 12/10/12 0508  WBC 7.0 5.6  HGB 12.7* 12.9*  HCT 37.6* 38.2*  PLT 124* 124*   BMET  Recent Labs  12/09/12 0506 12/10/12 0508  NA 139 137  K 3.2* 3.7  CL 105 103  CO2 27 28  GLUCOSE 99 93  BUN 9 7  CREATININE 0.70 0.86  CALCIUM 8.8 9.1   PT/INR No results found for this basename: LABPROT, INR,  in the last 72 hours ABG No results found for this basename: PHART, PCO2, PO2, HCO3,  in the last 72 hours  Studies/Results: No results found.  Anti-infectives: Anti-infectives   Start     Dose/Rate Route Frequency Ordered Stop   12/07/12 0809  cefoTEtan (CEFOTAN) 2 g in dextrose 5 % 50 mL IVPB     2 g 100 mL/hr over 30 Minutes Intravenous On call to O.R. 12/07/12 0810 12/07/12 1020      Assessment/Plan: s/p Procedure(s): ILEOSTOMY TAKEDOWN (N/A) Advance diet Plan for discharge tomorrow  LOS: 3 days    Lodema Pilot DAVID 12/10/2012

## 2012-12-11 ENCOUNTER — Ambulatory Visit: Payer: BC Managed Care – PPO | Admitting: Family Medicine

## 2012-12-11 MED ORDER — OXYCODONE HCL 5 MG PO TABS: 5.0000 mg | ORAL_TABLET | ORAL | Status: DC | PRN

## 2012-12-11 NOTE — Discharge Summary (Signed)
Physician Discharge Summary  Patient ID: Jacob [name] MRN: [id number] DOB/AGE: 08-22-1970 42 y.o.  Admit date: 12/07/2012 Discharge date: 12/11/2012  Admission Diagnoses: loop ileostomy, coloanal anastomosis  Discharge Diagnoses: same Active Problems:   Malignant neoplasm of rectum   Discharged Condition: good  Hospital Course: The patient was admitted after take down of his loop ileostomy.  He did well and his diet was advanced without difficulty.  By POD 4 he was tolerating a diet, having BM's and his pain was controlled on oral medications.  He will pack his wound daily at home.  Consults: None  Significant Diagnostic Studies: labs: cbc, chem  Treatments: IV hydration, analgesia: acetaminophen w/ codeine and surgery: ostomy reversal  Discharge Exam: Blood pressure 153/97, pulse 68, temperature 98.2 F (36.8 C), temperature source Oral, resp. rate 16, height 5\' 10"  (1.778 m), weight 188 lb 9.6 oz (85.548 kg), SpO2 98.00%. General appearance: alert and cooperative GI: soft, non-tender; bowel sounds normal; no masses,  no organomegaly Incision/Wound: good granulation tissue within packed wound  Disposition: 01-Home or Self Care   Future Appointments Provider Department Dept Phone   12/28/2012 2:15 PM Bradd Canary, MD [facility]  [facility] [phone]   01/29/2013 12:30 PM Ladene Artist, MD [facility] [phone]       Medication List    STOP taking these medications       acetaminophen 500 MG tablet  Commonly known as:  TYLENOL     HYDROcodone-acetaminophen 5-325 MG per tablet  Commonly known as:  NORCO/VICODIN      TAKE these medications       ALPRAZolam 0.25 MG tablet  Commonly known as:  XANAX  Take 1 tablet (0.25 mg total) by mouth 2 (two) times daily as needed for sleep or anxiety.     oxyCODONE 5 MG immediate release tablet  Commonly known as:  Oxy IR/ROXICODONE  Take 1-2 tablets (5-10 mg total) by mouth  every 4 (four) hours as needed.         Signed: Aritzel Krusemark C. 12/11/2012, 7:37 AM

## 2012-12-11 NOTE — Progress Notes (Signed)
DC instructions reviewed with the pt and his wife. Reviewed dsg change with the two of them as well and gave pt enough supplies to do the dressing change until he is able to get more. All questions and concerns were addressed. Follow up appointments are in place. Pt alert and oriented times four, VSS, with the exception of his ABD incision all other skin is intact, pt ambulatory, NAD, medicated for pain before discharging and prescriptions for pain given to pt to have filled. All belongings sent home with pt.

## 2012-12-25 ENCOUNTER — Ambulatory Visit (INDEPENDENT_AMBULATORY_CARE_PROVIDER_SITE_OTHER): Payer: BC Managed Care – PPO | Admitting: General Surgery

## 2012-12-25 ENCOUNTER — Encounter (INDEPENDENT_AMBULATORY_CARE_PROVIDER_SITE_OTHER): Payer: Self-pay | Admitting: General Surgery

## 2012-12-25 VITALS — BP 118/70 | HR 74 | Temp 97.9°F | Resp 14 | Ht 70.0 in | Wt 188.4 lb

## 2012-12-25 DIAGNOSIS — Z9889 Other specified postprocedural states: Secondary | ICD-10-CM

## 2012-12-25 NOTE — Progress Notes (Signed)
Jacob [name] is a 42 y.o. male who is status post a ileostomy reversal on 9/18.  He is doing ok.  He is having issues with urgency at night.  He denies any leakage.  It is painful to have BM's, but he is using the sitz bath regularly.  He does seem to be developing some mild obstructive symptoms as well.  His ostomy site is doing well.  He is packing it daily.    Objective: Filed Vitals:   12/25/12 1420  BP: 118/70  Pulse: 74  Temp: 97.9 F (36.6 C)  Resp: 14    General appearance: alert and cooperative GI: normal findings: soft, non-tender ileostomy site healing well  Incision: anal stenosis present but able to pass tip of my index finger without too much discomfort   Assessment: s/p  Patient Active Problem List   Diagnosis Date Noted  . Ileostomy in place 07/01/2012  . Grief reaction 06/18/2012  . Malignant neoplasm of rectum 03/17/2012  . GERD (gastroesophageal reflux disease)   . Elevated liver function tests 01/19/2012  . Overweight 01/19/2012  . Elevated BP 01/19/2012  . Sun-damaged skin 01/19/2012    Plan: Doing well.  Will start miralax and titrate to soft stools.  Ok to stop packing ileostomy wound    .Vanita Panda, MD [facility], [place] [phone]   12/25/2012 2:58 PM

## 2012-12-25 NOTE — Patient Instructions (Signed)
Start miralax daily to help with bowel movements.  Can titrate between 8.5 and 34g

## 2012-12-26 ENCOUNTER — Encounter: Payer: Self-pay | Admitting: Gastroenterology

## 2012-12-28 ENCOUNTER — Ambulatory Visit (INDEPENDENT_AMBULATORY_CARE_PROVIDER_SITE_OTHER): Payer: BC Managed Care – PPO | Admitting: Family Medicine

## 2012-12-28 ENCOUNTER — Encounter: Payer: Self-pay | Admitting: Family Medicine

## 2012-12-28 VITALS — BP 110/78 | HR 73 | Temp 98.5°F | Ht 70.0 in | Wt 186.0 lb

## 2012-12-28 DIAGNOSIS — D531 Other megaloblastic anemias, not elsewhere classified: Secondary | ICD-10-CM

## 2012-12-28 DIAGNOSIS — D539 Nutritional anemia, unspecified: Secondary | ICD-10-CM

## 2012-12-28 DIAGNOSIS — IMO0002 Reserved for concepts with insufficient information to code with codable children: Secondary | ICD-10-CM

## 2012-12-28 DIAGNOSIS — E876 Hypokalemia: Secondary | ICD-10-CM

## 2012-12-28 DIAGNOSIS — R030 Elevated blood-pressure reading, without diagnosis of hypertension: Secondary | ICD-10-CM

## 2012-12-28 DIAGNOSIS — G622 Polyneuropathy due to other toxic agents: Secondary | ICD-10-CM

## 2012-12-28 DIAGNOSIS — N529 Male erectile dysfunction, unspecified: Secondary | ICD-10-CM

## 2012-12-28 DIAGNOSIS — D649 Anemia, unspecified: Secondary | ICD-10-CM

## 2012-12-28 LAB — RENAL FUNCTION PANEL
BUN: 13 mg/dL (ref 6–23)
CO2: 27 mEq/L (ref 19–32)
Calcium: 9.4 mg/dL (ref 8.4–10.5)
Creat: 0.91 mg/dL (ref 0.50–1.35)
Phosphorus: 2.7 mg/dL (ref 2.3–4.6)
Potassium: 3.5 mEq/L (ref 3.5–5.3)

## 2012-12-28 LAB — CBC
HCT: 39.4 % (ref 39.0–52.0)
MCHC: 35 g/dL (ref 30.0–36.0)
RDW: 13.2 % (ref 11.5–15.5)

## 2012-12-28 MED ORDER — TADALAFIL 10 MG PO TABS: 10.0000 mg | ORAL_TABLET | Freq: Every day | ORAL | Status: DC | PRN

## 2012-12-28 NOTE — Progress Notes (Signed)
Patient ID: Jacob [name], male   DOB: Oct 07, 1970, 42 y.o.   MRN: [id number] Jacob [name] [id number] 05/14/1970 12/28/2012      Progress Note-Follow Up  Subjective  Chief Complaint  Chief Complaint  Patient presents with  . Follow-up    3 month    HPI  Patient is a 42 year old Caucasian male who is in today for followup. Generally doing well since his last visit. Frustrated with his persistent peripheral neuropathy. He says it has been present in his hands and feet left greater than right ever since his chemotherapy treatments. Notes in the past concerns noted today.  Past Medical History  Diagnosis Date  . Chicken pox as a child    ?  . Hernia 6 months old  . Allergic state 01/19/2012  . Reflux 01/19/2012  . Elevated liver function tests 01/19/2012  . Overweight(278.02) 01/19/2012  . Preventative health care 01/19/2012  . Elevated BP 01/19/2012  . Sun-damaged skin 01/19/2012  . Migraine headache as a child  . Hx of migraines   . Radiation 03/20/12-04/27/12    Pelvis 50.4 gray Rectal cancer  . Anxiety   . Fatty liver   . GERD (gastroesophageal reflux disease)     NO RECENT PROBLEMS - NO MEDS  . 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 Cancer 03/06/12    rectal bx=Adenocarcinoma  PT HAD RADIATION , CHEMO SURGERY  . Dysrhythmia     HX OF IRREGULAR HB AT TIME OF TONSILLECTOMY - SURGERY WAS NOT DONE-CAN'T REMEMBER ANY OTHER DETAILS- NEVER HAD THE SURGERY.  . Numbness     TOES OF BOTH FEET.  . Pain     LEFT HEEL PAIN -SEVERE ESPECIALLY AFTER SITTING OR LYING DOWN - DIFFICULT TO GET OUT OF BED AND WALK IN THE MORNINGS BECAUSE OF HEEL PAIN    Past Surgical History  Procedure Laterality Date  . Rectal biopsy  03/06/12    distal mass1-2cm from anal verge=Adenocarcinoma  . Eus  03/14/2012    Procedure: LOWER ENDOSCOPIC ULTRASOUND (EUS);  Surgeon: Rachael Fee, MD;  Location: [facility];  Service: Endoscopy;  Laterality: N/A;  . Hernia repair  6 months old     right inguinal repair  . Cyst excision      L EAR AREA  . Laparoscopic low anterior resection N/A 06/29/2012    Procedure: LAPAROSCOPIC LOW ANTERIOR RESECTION, mobilization splenic flexure,coloanal anastomosis,diverting ileostomy;  Surgeon: Romie Levee, MD;  Location: [facility];  Service: General;  Laterality: N/A;  . Ileostomy closure N/A 12/07/2012    Procedure: ILEOSTOMY TAKEDOWN;  Surgeon: Romie Levee, MD;  Location: [facility];  Service: General;  Laterality: N/A;    Family History  Problem Relation Age of Onset  . Adopted: Yes  . Cancer Father   . Heart disease Father   . Colon cancer Neg Hx     History   Social History  . Marital Status: Married    Spouse Name: N/A    Number of Children: 1  . Years of Education: N/A   Occupational History  . Scultor/Professor     [employer]   Social History Main Topics  . Smoking status: Never Smoker   . Smokeless tobacco: Never Used  . Alcohol Use: Yes     Comment: RARE ALCOHOL  . Drug Use: No     Comment: marijuana  . Sexual Activity: Yes    Partners: Female   Other Topics Concern  . Not on file   Social History  Narrative   Married to wife, Berton Lan   Has one 24 year old child   Occupation: Brewing technologist at [employer]    Current Outpatient Prescriptions on File Prior to Visit  Medication Sig Dispense Refill  . ALPRAZolam (XANAX) 0.25 MG tablet Take 1 tablet (0.25 mg total) by mouth 2 (two) times daily as needed for sleep or anxiety.  60 tablet  2   No current facility-administered medications on file prior to visit.    Allergies  Allergen Reactions  . Penicillins Rash    Does not recall    Review of Systems  Review of Systems  Constitutional: Negative for fever and malaise/fatigue.  HENT: Negative for congestion.   Eyes: Negative for discharge.  Respiratory: Negative for shortness of breath.   Cardiovascular: Negative for chest pain, palpitations and leg swelling.  Gastrointestinal: Positive for constipation and  blood in stool. Negative for nausea, abdominal pain, diarrhea and melena.  Genitourinary: Negative for dysuria.  Musculoskeletal: Negative for falls.  Skin: Negative for rash.  Neurological: Negative for loss of consciousness and headaches.  Endo/Heme/Allergies: Negative for polydipsia.  Psychiatric/Behavioral: Negative for depression and suicidal ideas. The patient is nervous/anxious and has insomnia.     Objective  BP 110/78  Pulse 73  Temp(Src) 98.5 F (36.9 C) (Oral)  Ht 5\' 10"  (1.778 m)  Wt 186 lb (84.369 kg)  BMI 26.69 kg/m2  SpO2 98%  Physical Exam  Physical Exam  Constitutional: He is oriented to person, place, and time and well-developed, well-nourished, and in no distress. No distress.  HENT:  Head: Normocephalic and atraumatic.  Eyes: Conjunctivae are normal.  Neck: Neck supple. No thyromegaly present.  Cardiovascular: Normal rate, regular rhythm and normal heart sounds.   No murmur heard. Pulmonary/Chest: Effort normal and breath sounds normal. No respiratory distress.  Abdominal: Soft. Bowel sounds are normal. He exhibits no distension and no mass. There is no tenderness. There is no rebound and no guarding.  sscar rlq healing well no surrounding fluctuance  Musculoskeletal: He exhibits no edema.  Neurological: He is alert and oriented to person, place, and time.  Skin: Skin is warm.  Psychiatric: Memory, affect and judgment normal.    Lab Results  Component Value Date   TSH 1.47 01/19/2012   Lab Results  Component Value Date   WBC 5.6 12/10/2012   HGB 12.9* 12/10/2012   HCT 38.2* 12/10/2012   MCV 98.5 12/10/2012   PLT 124* 12/10/2012   Lab Results  Component Value Date   CREATININE 0.86 12/10/2012   BUN 7 12/10/2012   NA 137 12/10/2012   K 3.7 12/10/2012   CL 103 12/10/2012   CO2 28 12/10/2012   Lab Results  Component Value Date   ALT 87* 11/29/2012   AST 53* 11/29/2012   ALKPHOS 63 11/29/2012   BILITOT 0.2* 11/29/2012     Assessment &  Plan  Elevated BP Well controlled, no changes no meds  Peripheral neuropathy, secondary to drugs or chemicals Stable and tolerable will consider meds if worsens  Anemia Resolved at this time

## 2012-12-28 NOTE — Patient Instructions (Signed)
Levitra, Cialis, Viagra for ED, call insurance, company websites  Prune juice and Dulcolax suppository each evening, cleanse with Witch Hazel Astringent after Sitz bath Can increase Benefiber to twice daily Restart a probiotic such as Digestive Advantage

## 2012-12-29 LAB — TSH: TSH: 1.549 u[IU]/mL (ref 0.350–4.500)

## 2012-12-29 LAB — FOLATE: Folate: >20 ng/mL

## 2012-12-30 LAB — INTRINSIC FACTOR ANTIBODIES: Intrinsic Factor: NEGATIVE

## 2012-12-31 ENCOUNTER — Encounter: Payer: Self-pay | Admitting: Family Medicine

## 2012-12-31 DIAGNOSIS — D649 Anemia, unspecified: Secondary | ICD-10-CM

## 2012-12-31 DIAGNOSIS — IMO0002 Reserved for concepts with insufficient information to code with codable children: Secondary | ICD-10-CM

## 2012-12-31 HISTORY — DX: Reserved for concepts with insufficient information to code with codable children: IMO0002

## 2012-12-31 HISTORY — DX: Anemia, unspecified: D64.9

## 2012-12-31 NOTE — Assessment & Plan Note (Signed)
Stable and tolerable will consider meds if worsens

## 2012-12-31 NOTE — Assessment & Plan Note (Signed)
Well controlled, no changes no meds

## 2012-12-31 NOTE — Assessment & Plan Note (Signed)
Resolved at this time.  °

## 2013-01-25 ENCOUNTER — Encounter (INDEPENDENT_AMBULATORY_CARE_PROVIDER_SITE_OTHER): Payer: Self-pay | Admitting: General Surgery

## 2013-01-25 ENCOUNTER — Ambulatory Visit (INDEPENDENT_AMBULATORY_CARE_PROVIDER_SITE_OTHER): Payer: BC Managed Care – PPO | Admitting: General Surgery

## 2013-01-25 VITALS — BP 118/66 | HR 64 | Temp 98.0°F | Resp 16 | Ht 70.0 in | Wt 186.8 lb

## 2013-01-25 DIAGNOSIS — C20 Malignant neoplasm of rectum: Secondary | ICD-10-CM

## 2013-01-25 NOTE — Progress Notes (Signed)
Jacob [name] is a 42 y.o. male who is here for a follow up visit regarding his rectal cancer.  He is ~2 mo s/p ileostomy reversal.  He is having some urgency, but this is getting better.  His constipation symptoms are better as well.  He is having min pain and reg BM's.    Cancer history: Rectal cancer. Partially obstructing mass noted 1-2 cm from the anal verge on a colonoscopy 03/06/2012. Endoscopic ultrasound 03/14/2012 with a 7.5 mm thick, 3.2 cm wide hypoechoic, irregularly bordered mass that clearly passed into and through the muscularis propria layer of the distal rectal wall (uT3); 3 small (largest 7 mm) perirectal lymph nodes. The lymph nodes were all round, discrete, hypoechoic, homogenous; suspicious for malignant involvement (uN1). He began radiation and concurrent Xeloda chemotherapy on 03/20/2012, completed 04/27/2012.  -low anterior resection/coloanal anastomosis and diverting ileostomy 06/29/2012 with the final pathology revealing a T2N0 tumor with extensive fibrosis and negative margins  -Cycle 1 of adjuvant CAPOX 07/19/2012; fifth and final cycle of adjuvant CAPOX beginning 10/11/2012.   Objective: Filed Vitals:   01/25/13 1458  BP: 118/66  Pulse: 64  Temp: 98 F (36.7 C)  Resp: 16    General appearance: alert and cooperative GI: soft, non-tender; bowel sounds normal; no masses,  no organomegaly Ileostomy site healing well  Assessment and Plan: Doing well after ileostomy reversal.   He will need a repeat colonoscopy within the next few months as well.  Return to the office in 3 months for DRE.  Hopefully after that we can switch to q 6 mo visits alternating with Dr Kalman Drape office for his follow up.      Vanita Panda, MD [facility], [place] [phone]

## 2013-01-25 NOTE — Patient Instructions (Signed)
Return to the office in 3 months 

## 2013-01-29 ENCOUNTER — Ambulatory Visit (HOSPITAL_BASED_OUTPATIENT_CLINIC_OR_DEPARTMENT_OTHER): Payer: BC Managed Care – PPO | Admitting: Oncology

## 2013-01-29 ENCOUNTER — Telehealth: Payer: Self-pay | Admitting: Oncology

## 2013-01-29 ENCOUNTER — Other Ambulatory Visit: Payer: Self-pay | Admitting: *Deleted

## 2013-01-29 VITALS — BP 129/79 | HR 69 | Temp 98.3°F | Resp 20 | Ht 70.0 in | Wt 187.4 lb

## 2013-01-29 DIAGNOSIS — G620 Drug-induced polyneuropathy: Secondary | ICD-10-CM

## 2013-01-29 DIAGNOSIS — C20 Malignant neoplasm of rectum: Secondary | ICD-10-CM

## 2013-01-29 DIAGNOSIS — R748 Abnormal levels of other serum enzymes: Secondary | ICD-10-CM

## 2013-01-29 DIAGNOSIS — K7689 Other specified diseases of liver: Secondary | ICD-10-CM

## 2013-01-29 DIAGNOSIS — K625 Hemorrhage of anus and rectum: Secondary | ICD-10-CM

## 2013-01-29 NOTE — Progress Notes (Signed)
    Cancer Center    OFFICE PROGRESS NOTE   INTERVAL HISTORY:   Jacob [name] returns for scheduled followup of rectal cancer. He underwent an ileostomy takedown procedure on 12/07/2012. He continues to have rectal urgency. He reports improvement in the neuropathy symptoms at the hands. He continues to have numbness and tingling in the toes. He has returned to work to  Objective:  Vital signs in last 24 hours:  Blood pressure 129/79, pulse 69, temperature 98.3 F (36.8 C), temperature source Oral, resp. rate 20, height 5\' 10"  (1.778 m), weight 187 lb 6.4 oz (85.004 kg), SpO2 100.00%.    HEENT: Neck without mass Lymphatics: No cervical, supra-auricular, axillary, or inguinal nodes Resp: Lungs clear bilaterally Cardio: Regular rate and rhythm GI: No hepatomegaly, nontender, no mass. Healed right lower quadrant ostomy site Vascular: No leg edema   Lab Results: CEA on 11/14/2012-2.5   Medications: I have reviewed the patient's current medications.  Assessment/Plan: 1. Rectal cancer. Partially obstructing mass noted 1-2 cm from the anal verge on a colonoscopy 03/06/2012. Endoscopic ultrasound 03/14/2012 with a 7.5 mm thick, 3.2 cm wide hypoechoic, irregularly bordered mass that clearly passed into and through the muscularis propria layer of the distal rectal wall (uT3); 3 small (largest 7 mm) perirectal lymph nodes. The lymph nodes were all round, discrete, hypoechoic, homogenous; suspicious for malignant involvement (uN1). He began radiation and concurrent Xeloda chemotherapy on 03/20/2012, completed 04/27/2012  -low anterior resection/coloanal anastomosis and diverting ileostomy 06/29/2012 with the final pathology revealing a T2N0 tumor with extensive fibrosis and negative margins  -Cycle 1 of adjuvant CAPOX 07/19/2012  -Cycle 5 of adjuvant CAPOX 10/11/2012 2. Irregular bowel habits/rectal bleeding secondary to #1. 3. Mild elevation of the liver enzymes. Question secondary  to hepatic steatosis. 4. Indeterminate 8 mm posterior right liver lesion on the staging CT 03/06/2012. 5. Mildly elevated CEA at 5.7 on 03/06/2012. 6. History of Radiation erythema at the groin and perineum 7. 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 No clinical evidence of thrombophlebitis or extravasation. 9. Delayed nausea following chemotherapy-Decadron prophylaxis was added with cycle 3 CAPOX-improved 10. 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 Jacob [name] remains in clinical remission from rectal cancer. He will be scheduled for a surveillance CT evaluation in December. He will return for an office visit and CEA in 6 months. We will refer him to Dr. Christella Hartigan for a surveillance colonoscopy approximately one year from the low anterior resection. Hopefully the neuropathy symptoms will improve over the next few months. We referred him to physical therapy for help with the rectal symptoms.   Thornton Papas, MD  01/29/2013  1:05 PM

## 2013-01-29 NOTE — Telephone Encounter (Signed)
Gave pt appt for lab  on December and MD,on May 2015, pt on recall list for Dr. Christella Hartigan GI, template for GI not available, pt is aware

## 2013-02-05 ENCOUNTER — Ambulatory Visit: Payer: BC Managed Care – PPO | Attending: Oncology | Admitting: Physical Therapy

## 2013-02-05 DIAGNOSIS — IMO0001 Reserved for inherently not codable concepts without codable children: Secondary | ICD-10-CM | POA: Insufficient documentation

## 2013-02-05 DIAGNOSIS — M2420 Disorder of ligament, unspecified site: Secondary | ICD-10-CM | POA: Insufficient documentation

## 2013-02-05 DIAGNOSIS — M629 Disorder of muscle, unspecified: Secondary | ICD-10-CM | POA: Insufficient documentation

## 2013-02-20 ENCOUNTER — Ambulatory Visit: Payer: BC Managed Care – PPO | Attending: Oncology | Admitting: Physical Therapy

## 2013-02-20 DIAGNOSIS — M2420 Disorder of ligament, unspecified site: Secondary | ICD-10-CM | POA: Insufficient documentation

## 2013-02-20 DIAGNOSIS — M629 Disorder of muscle, unspecified: Secondary | ICD-10-CM | POA: Insufficient documentation

## 2013-02-20 DIAGNOSIS — IMO0001 Reserved for inherently not codable concepts without codable children: Secondary | ICD-10-CM | POA: Insufficient documentation

## 2013-02-27 ENCOUNTER — Ambulatory Visit: Payer: BC Managed Care – PPO | Admitting: Physical Therapy

## 2013-03-08 ENCOUNTER — Ambulatory Visit (HOSPITAL_COMMUNITY): Payer: BC Managed Care – PPO

## 2013-03-08 ENCOUNTER — Other Ambulatory Visit (HOSPITAL_BASED_OUTPATIENT_CLINIC_OR_DEPARTMENT_OTHER): Payer: BC Managed Care – PPO

## 2013-03-08 ENCOUNTER — Encounter (HOSPITAL_COMMUNITY): Payer: Self-pay

## 2013-03-08 ENCOUNTER — Ambulatory Visit (HOSPITAL_COMMUNITY)
Admission: RE | Admit: 2013-03-08 | Discharge: 2013-03-08 | Disposition: A | Discharge status: Home / Self Care | Payer: BC Managed Care – PPO | Source: Ambulatory Visit | Attending: Oncology | Admitting: Oncology

## 2013-03-08 DIAGNOSIS — Z9221 Personal history of antineoplastic chemotherapy: Secondary | ICD-10-CM | POA: Insufficient documentation

## 2013-03-08 DIAGNOSIS — K7689 Other specified diseases of liver: Secondary | ICD-10-CM | POA: Insufficient documentation

## 2013-03-08 DIAGNOSIS — C20 Malignant neoplasm of rectum: Secondary | ICD-10-CM

## 2013-03-08 DIAGNOSIS — C19 Malignant neoplasm of rectosigmoid junction: Secondary | ICD-10-CM | POA: Insufficient documentation

## 2013-03-08 DIAGNOSIS — Z923 Personal history of irradiation: Secondary | ICD-10-CM | POA: Insufficient documentation

## 2013-03-08 DIAGNOSIS — R911 Solitary pulmonary nodule: Secondary | ICD-10-CM | POA: Insufficient documentation

## 2013-03-08 LAB — COMPREHENSIVE METABOLIC PANEL (CC13)
AST: 51 U/L — ABNORMAL HIGH (ref 5–34)
Albumin: 4.1 g/dL (ref 3.5–5.0)
Alkaline Phosphatase: 66 U/L (ref 40–150)
Anion Gap: 11 mEq/L (ref 3–11)
Creatinine: 0.8 mg/dL (ref 0.7–1.3)
Glucose: 94 mg/dl (ref 70–140)
Potassium: 3.9 mEq/L (ref 3.5–5.1)
Sodium: 141 mEq/L (ref 136–145)
Total Protein: 7.1 g/dL (ref 6.4–8.3)

## 2013-03-08 MED ORDER — IOHEXOL 300 MG/ML  SOLN
100.0000 mL | Freq: Once | INTRAMUSCULAR | Status: AC | PRN
  Administered 2013-03-08: 100 mL via INTRAVENOUS

## 2013-03-09 ENCOUNTER — Other Ambulatory Visit: Payer: Self-pay | Admitting: *Deleted

## 2013-03-09 ENCOUNTER — Telehealth: Payer: Self-pay | Admitting: Oncology

## 2013-03-09 NOTE — Progress Notes (Signed)
Dr. Truett Perna called pt with CT results. Pt to be seen in office on 12/22. POF sent to schedulers.

## 2013-03-12 ENCOUNTER — Ambulatory Visit (HOSPITAL_BASED_OUTPATIENT_CLINIC_OR_DEPARTMENT_OTHER): Payer: BC Managed Care – PPO | Admitting: Nurse Practitioner

## 2013-03-12 ENCOUNTER — Telehealth: Payer: Self-pay | Admitting: Oncology

## 2013-03-12 VITALS — BP 139/85 | HR 72 | Temp 97.0°F | Resp 20 | Ht 70.0 in | Wt 189.1 lb

## 2013-03-12 DIAGNOSIS — C20 Malignant neoplasm of rectum: Secondary | ICD-10-CM

## 2013-03-12 DIAGNOSIS — C787 Secondary malignant neoplasm of liver and intrahepatic bile duct: Secondary | ICD-10-CM

## 2013-03-12 NOTE — Progress Notes (Addendum)
OFFICE PROGRESS NOTE  Interval history:  Mr. Gundrum returns prior to scheduled followup for discussion regarding recent CT findings.  The CEA returned at 14.6 on 03/08/2013. CT scans chest/abdomen/pelvis also on 03/08/2013 showed a small 4 mm pulmonary nodule at the left lung base not identified on the comparison exam. There was also a new irregular peripheral enhancing rounded lesion in the dome of the right hepatic lobe measuring 29 x 26 mm; a less well-defined new sub-capsular lesion in the lateral right hepatic lobe measuring 12 mm; new subcapsular lesion in the anterior right hepatic lobe adjacent to the gallbladder fossa measuring 10 mm; a rounded low-density lesion in the inferior right hepatic lobe measuring 10 mm compared to 7 mm on prior (radiologist commented this may represent an enlarged cyst).  He overall feels well. He has a good appetite. No abdominal pain. Bowel habits continue to be erratic though he has noted some improvement.   Objective: Blood pressure 139/85, pulse 72, temperature 97 F (36.1 C), temperature source Oral, resp. rate 20, height 5\' 10"  (1.778 m), weight 189 lb 1.6 oz (85.775 kg).  Oropharynx is without thrush or ulceration. No palpable cervical, supraclavicular or axillary lymph nodes. Lungs are clear. No wheezes or rales. Regular cardiac rhythm. Abdomen soft and nontender. No hepatomegaly. No leg edema.  Lab Results: Lab Results  Component Value Date   WBC 4.9 12/28/2012   HGB 13.8 12/28/2012   HCT 39.4 12/28/2012   MCV 93.6 12/28/2012   PLT 189 12/28/2012    Chemistry:    Chemistry      Component Value Date/Time   NA 141 03/08/2013 0820   NA 144 12/28/2012 1542   K 3.9 03/08/2013 0820   K 3.5 12/28/2012 1542   CL 106 12/28/2012 1542   CL 108* 08/30/2012 0903   CO2 24 03/08/2013 0820   CO2 27 12/28/2012 1542   BUN 12.3 03/08/2013 0820   BUN 13 12/28/2012 1542   CREATININE 0.8 03/08/2013 0820   CREATININE 0.91 12/28/2012 1542   CREATININE 0.86  12/10/2012 0508      Component Value Date/Time   CALCIUM 9.6 03/08/2013 0820   CALCIUM 9.4 12/28/2012 1542   ALKPHOS 66 03/08/2013 0820   ALKPHOS 63 11/29/2012 1050   AST 51* 03/08/2013 0820   AST 53* 11/29/2012 1050   ALT 91* 03/08/2013 0820   ALT 87* 11/29/2012 1050   BILITOT 0.48 03/08/2013 0820   BILITOT 0.2* 11/29/2012 1050       Studies/Results: Ct Chest W Contrast  03/08/2013   CLINICAL DATA:  Colorectal carcinoma diagnosed 2013 with resectino and ileostomy. Chemotherapy and radiation therapy complete.  EXAM: CT CHEST, ABDOMEN, AND PELVIS WITH CONTRAST  TECHNIQUE: Multidetector CT imaging of the chest, abdomen and pelvis was performed following the standard protocol during bolus administration of intravenous contrast.  CONTRAST:  OMNIPAQUE IOHEXOL 300 MG/ML  SOLN  COMPARISON:  CT CHEST W/CM dated 03/13/2012; CT ABD/PELVIS W CM dated 03/06/2012; US ABDOMEN COMPLETE dated 01/21/2012  FINDINGS: CT CHEST FINDINGS  No axillary or supraclavicular lymphadenopathy. No mediastinal or hilar lymphadenopathy. No pericardial fluid. Esophagus is normal.  Review of the lung parenchyma demonstrates a small all 4 mm pulmonary nodule at the left lung base which is not identified on comparison exam.  CT ABDOMEN AND PELVIS FINDINGS  There is a new irregular peripheral enhancing rounded lesion in the dome of the right hepatic lobe measuring 29 x 26 mm (image 46, series 2). There is a less well-defined new  subcapsular lesion in the lateral right hepatic lobe measuring 12 mm (image 53). New subcapsular lesion in the anterior right hepatic lobe adjacent to the gallbladder fossa measures 10 mm (image 60). Rounded low-density lesion in the inferior right hepatic lobe measures 10 mm compared to 7 mm on prior (this may represent an enlarged cyst).  The gallbladder, pancreas, spleen, adrenal glands, and kidneys are normal.  Stomach, small bowel, and cecum are normal. The colon demonstrates a partial left colectomy with  a low reanastomosis. No evidence of mass lesion or obstruction involving the colon.  There is no retroperitoneal periportal lymphadenopathy. No mesenteric adenopathy or peritoneal disease. No evidence of adenopathy within the pelvis.  No aggressive osseous lesion.  IMPRESSION: 1. Unfortunately, there are new lesions within the right hepatic lobe consistent with hepatic metastasis. 2. Patient status post left colectomy and reanastomosis without evidence of local recurrence. 3. Small 3 mm nodule in the left lower lobe is not seen on prior. This is concerning for a early metastasis.   Electronically Signed   By: Genevive Bi M.D.   On: 03/08/2013 10:58   Ct Abdomen Pelvis W Contrast  03/08/2013   CLINICAL DATA:  Colorectal carcinoma diagnosed 2013 with resectino and ileostomy. Chemotherapy and radiation therapy complete.  EXAM: CT CHEST, ABDOMEN, AND PELVIS WITH CONTRAST  TECHNIQUE: Multidetector CT imaging of the chest, abdomen and pelvis was performed following the standard protocol during bolus administration of intravenous contrast.  CONTRAST:  OMNIPAQUE IOHEXOL 300 MG/ML  SOLN  COMPARISON:  CT CHEST W/CM dated 03/13/2012; CT ABD/PELVIS W CM dated 03/06/2012; US ABDOMEN COMPLETE dated 01/21/2012  FINDINGS: CT CHEST FINDINGS  No axillary or supraclavicular lymphadenopathy. No mediastinal or hilar lymphadenopathy. No pericardial fluid. Esophagus is normal.  Review of the lung parenchyma demonstrates a small all 4 mm pulmonary nodule at the left lung base which is not identified on comparison exam.  CT ABDOMEN AND PELVIS FINDINGS  There is a new irregular peripheral enhancing rounded lesion in the dome of the right hepatic lobe measuring 29 x 26 mm (image 46, series 2). There is a less well-defined new subcapsular lesion in the lateral right hepatic lobe measuring 12 mm (image 53). New subcapsular lesion in the anterior right hepatic lobe adjacent to the gallbladder fossa measures 10 mm (image 60).  Rounded low-density lesion in the inferior right hepatic lobe measures 10 mm compared to 7 mm on prior (this may represent an enlarged cyst).  The gallbladder, pancreas, spleen, adrenal glands, and kidneys are normal.  Stomach, small bowel, and cecum are normal. The colon demonstrates a partial left colectomy with a low reanastomosis. No evidence of mass lesion or obstruction involving the colon.  There is no retroperitoneal periportal lymphadenopathy. No mesenteric adenopathy or peritoneal disease. No evidence of adenopathy within the pelvis.  No aggressive osseous lesion.  IMPRESSION: 1. Unfortunately, there are new lesions within the right hepatic lobe consistent with hepatic metastasis. 2. Patient status post left colectomy and reanastomosis without evidence of local recurrence. 3. Small 3 mm nodule in the left lower lobe is not seen on prior. This is concerning for a early metastasis.   Electronically Signed   By: Genevive Bi M.D.   On: 03/08/2013 10:58    Medications: I have reviewed the patient's current medications.  Assessment/Plan:  1. Rectal cancer. Partially obstructing mass noted 1-2 cm from the anal verge on a colonoscopy 03/06/2012. Endoscopic ultrasound 03/14/2012 with a 7.5 mm thick, 3.2 cm wide hypoechoic, irregularly bordered  mass that clearly passed into and through the muscularis propria layer of the distal rectal wall (uT3); 3 small (largest 7 mm) perirectal lymph nodes. The lymph nodes were all round, discrete, hypoechoic, homogenous; suspicious for malignant involvement (uN1).   He began radiation and concurrent Xeloda chemotherapy on 03/20/2012, completed 04/27/2012.   Low anterior resection/coloanal anastomosis and diverting ileostomy 06/29/2012 with the final pathology revealing a T2N0 tumor with extensive fibrosis and negative margins.   Cycle 1 of adjuvant CAPOX 07/19/2012. Cycle 5 of adjuvant CAPOX 10/11/2012.   CEA 2.5 on 11/14/2012.  CEA 14.6  03/08/2013.  Restaging CT evaluation 03/08/2013 with a 4 mm pulmonary nodule left lung base not identified on comparison exam; new liver lesions including a 29 x 26 mm irregular peripheral enhancing rounded lesion in the dome of the right hepatic lobe, a less well-defined new subcapsular lesion in the lateral right hepatic lobe measuring 12 mm, a new subcapsular lesion in the anterior right hepatic lobe adjacent to the gallbladder fossa measuring 10 mm; a rounded low-density lesion in the inferior right hepatic lobe measuring 10 mm compared to 7 mm on the prior study (radiologist commented this may represent an enlarged cyst). 2. Irregular bowel habits/rectal bleeding secondary to #1. 3. Mild elevation of the liver enzymes. Question secondary to hepatic steatosis. 4. Indeterminate 8 mm posterior right liver lesion on the staging CT 03/06/2012. 5. Mildly elevated CEA at 5.7 on 03/06/2012. 6. History of radiation erythema at the groin and perineum 7. 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 8. 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 No clinical evidence of thrombophlebitis or extravasation. 9. Delayed nausea following chemotherapy-Decadron prophylaxis was added with cycle 3 CAPOX-improved. 10. Ileostomy takedown 12/07/2012. 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  Disposition-Dr. Truett Perna reviewed the CT scan findings and images with Mr. Majka at today's visit. Mr. Cayson understands these findings most likely represent metastatic rectal cancer involving the liver and possibly lung.  Dr. Truett Perna recommends initiation of chemotherapy with FOLFIRI/Avastin. We reviewed potential toxicities associated with irinotecan including myelosuppression, hair loss, nausea, diarrhea (possibly severe). We discussed potential toxicities associated with 5-fluorouracil including mouth sores, nausea, diarrhea,  hand-foot syndrome, skin rash. We reviewed potential toxicities associated with Avastin including an allergic reaction, bleeding, delayed wound healing, proteinuria, hypertension, blood clots, strokes, bowel perforation. He was provided written information as well.  He will need to have a Port-A-Cath placed. We made a referral to Dr. Romie Levee.  Dr. Truett Perna discussed a referral for a second opinion. Mr. Waddell is in agreement. We made a referral to Dr. Forbes Cellar at [facility].  Dr. Truett Perna will present his case at an upcoming cancer conference.  Mr. Pavek would like to begin the first cycle of FOLFIRI/Avastin on 04/05/2013. We will see him that day prior to treatment. He will contact the office in the interim with any problems or questions.  Patient was seen with Dr. Truett Perna.    40 minutes were spent face-to-face at today's visit with the majority of that time involved in counseling/coordination of care.  Lonna Cobb ANP/GNP-BC  This was a shared visit with Lonna Cobb. The CEA is elevated and a restaging CT reveals evidence of new liver metastases. There is an indeterminate left lung nodule. I reviewed the CT images with Mr. Carmen. He appears to have metastatic rectal cancer. We discussed treatment options. I recommend proceeding with FOLFIRI/Avastin chemotherapy. We will refer him to Dr. Maisie Fus for placement of a Port-A-Cath prior  to beginning chemotherapy. We will also refer him to Dr. Forbes Cellar for a second opinion. He may be a candidate for a hepatic resection procedure in the future. If the left lung lesion proves to be a metastasis he may be a candidate for a lung resection. We discussed the potential toxicities associated with the FOLFIRI regimen and Avastin. He agrees to proceed. I contacted the research nurse to review his eligibility for the [facility] irinotecan dose escalation study. I discussed the study with Mr. Durr and he agrees to consider enrollment.  Mancel Bale, M.D.  Mancel Bale, M.D.

## 2013-03-12 NOTE — Telephone Encounter (Signed)
gv pt appt schedule for january. s/w tiffany in HIM re referral to dr Forbes Cellar. s/w glenda @ [facility] re port placement and also sent messag to dr Maisie Fus' assistant sara. pt aware [facility] will contact him re port and HIM will cotnact him re appt w/dr sanoff.

## 2013-03-13 ENCOUNTER — Encounter (HOSPITAL_COMMUNITY): Payer: Self-pay | Admitting: Pharmacy Technician

## 2013-03-13 ENCOUNTER — Other Ambulatory Visit (INDEPENDENT_AMBULATORY_CARE_PROVIDER_SITE_OTHER): Payer: Self-pay | Admitting: General Surgery

## 2013-03-13 ENCOUNTER — Telehealth: Payer: Self-pay | Admitting: Oncology

## 2013-03-13 NOTE — Telephone Encounter (Signed)
PT APPT WITH DR SANOFF AT [facility] IS 03/30/13 @11 :00. MEDICAL RECORDS FAXED, SLIDES WILL BE FEDEX'ED. PT IS AWARE

## 2013-03-13 NOTE — Progress Notes (Signed)
Dr. Maisie Fus - Please enter preop orders in Epic for Jacob [name] - he is coming to [facility] on 12/30 for his preop appt / labs.  Thanks.

## 2013-03-14 ENCOUNTER — Telehealth: Payer: Self-pay | Admitting: *Deleted

## 2013-03-14 NOTE — Telephone Encounter (Signed)
03/14/2013 Spoke with patient by phone today to let him know that we have been in contact with [facility] regarding whether he might be a potential candidate for the [facility] 1317 research study. At this time, we have outstanding questions regarding his eligibility, and will give him updated information once we receive responses. Due to upcoming holidays, responses are expected by Tuesday December 30th, and the patient will be contacted at that time with an update. Cindy S. Clelia Croft BSN, RN, [facility] 03/14/2013 9:36 AM

## 2013-03-19 ENCOUNTER — Telehealth: Payer: Self-pay | Admitting: *Deleted

## 2013-03-19 NOTE — Progress Notes (Signed)
Routed abnormal CT scan to Dr. Romie Levee

## 2013-03-19 NOTE — Patient Instructions (Addendum)
Jacob [name]  03/19/2013   Your procedure is scheduled on: 03/21/13  Report to [facility] at 6:00 AM.  Call this number if you have problems the morning of surgery [phone]   Remember:   Do not eat food or drink liquids After Midnight.     Take these medicines the morning of surgery with A SIP OF WATER: xanax if needed   Do not wear jewelry, make-up or nail polish.  Do not wear lotions, powders, or perfumes. You may wear deodorant.  Do not shave 48 hours prior to surgery. Men may shave face and neck.  Do not bring valuables to the hospital.   Patients discharged the day of surgery will not be allowed to drive home.  Name and phone number of your driver: Jacob [name] [phone]  Jacob Sons, RN  pre op nurse call if needed [phone]    FAILURE TO FOLLOW THESE INSTRUCTIONS MAY RESULT IN CANCELLATION OF YOUR SURGERY   Patient Signature: ___________________________________________

## 2013-03-19 NOTE — Telephone Encounter (Signed)
Left a message on patient's home phone (also listed as cell number) to tell him that [facility] principal investigator has said he is potentially eligible for [facility] 1317.  Asked that he return my call so I can set up an appt with Judithann Graves for a consent visit for 03/20/13.

## 2013-03-19 NOTE — Progress Notes (Signed)
EKG 12/12/12 on EPIC, chest x-ray 03/08/13 on EPIC, CMET 03/08/13 on EPIC

## 2013-03-20 ENCOUNTER — Telehealth (INDEPENDENT_AMBULATORY_CARE_PROVIDER_SITE_OTHER): Payer: Self-pay

## 2013-03-20 ENCOUNTER — Other Ambulatory Visit: Payer: BC Managed Care – PPO

## 2013-03-20 ENCOUNTER — Encounter: Payer: Self-pay | Admitting: *Deleted

## 2013-03-20 ENCOUNTER — Encounter: Payer: BC Managed Care – PPO | Admitting: *Deleted

## 2013-03-20 ENCOUNTER — Encounter (HOSPITAL_COMMUNITY): Payer: Self-pay

## 2013-03-20 ENCOUNTER — Other Ambulatory Visit: Payer: Self-pay | Admitting: *Deleted

## 2013-03-20 ENCOUNTER — Encounter (HOSPITAL_COMMUNITY)
Admission: RE | Admit: 2013-03-20 | Discharge: 2013-03-20 | Disposition: A | Discharge status: Home / Self Care | Payer: BC Managed Care – PPO | Source: Ambulatory Visit | Attending: General Surgery | Admitting: General Surgery

## 2013-03-20 ENCOUNTER — Ambulatory Visit: Payer: BC Managed Care – PPO | Admitting: Physical Therapy

## 2013-03-20 DIAGNOSIS — C20 Malignant neoplasm of rectum: Secondary | ICD-10-CM

## 2013-03-20 LAB — CBC
HCT: 41.5 % (ref 39.0–52.0)
Hemoglobin: 14.2 g/dL (ref 13.0–17.0)
MCH: 29.8 pg (ref 26.0–34.0)
MCHC: 34.2 g/dL (ref 30.0–36.0)
MCV: 87.2 fL (ref 78.0–100.0)
RBC: 4.76 MIL/uL (ref 4.22–5.81)

## 2013-03-20 NOTE — Telephone Encounter (Signed)
Dr. Kalman Drape office calling to confirm that his patient will need his PAC placed. He is scheduled for 03/21/13 with Dr. Maisie Fus. They had received a message from our nurse, Rivka Barbara.

## 2013-03-21 ENCOUNTER — Ambulatory Visit (HOSPITAL_COMMUNITY)
Admission: RE | Admit: 2013-03-21 | Discharge: 2013-03-21 | Disposition: A | Discharge status: Home / Self Care | Payer: BC Managed Care – PPO | Source: Ambulatory Visit | Attending: General Surgery | Admitting: General Surgery

## 2013-03-21 ENCOUNTER — Encounter (HOSPITAL_COMMUNITY): Payer: BC Managed Care – PPO | Admitting: Certified Registered Nurse Anesthetist

## 2013-03-21 ENCOUNTER — Encounter (HOSPITAL_COMMUNITY)
Admission: RE | Disposition: A | Discharge status: Home / Self Care | Payer: Self-pay | Source: Ambulatory Visit | Attending: General Surgery

## 2013-03-21 ENCOUNTER — Ambulatory Visit (HOSPITAL_COMMUNITY): Payer: BC Managed Care – PPO

## 2013-03-21 ENCOUNTER — Encounter (HOSPITAL_COMMUNITY): Payer: Self-pay | Admitting: *Deleted

## 2013-03-21 ENCOUNTER — Ambulatory Visit (HOSPITAL_COMMUNITY): Payer: BC Managed Care – PPO | Admitting: Certified Registered Nurse Anesthetist

## 2013-03-21 DIAGNOSIS — C19 Malignant neoplasm of rectosigmoid junction: Secondary | ICD-10-CM

## 2013-03-21 DIAGNOSIS — Z79899 Other long term (current) drug therapy: Secondary | ICD-10-CM | POA: Insufficient documentation

## 2013-03-21 DIAGNOSIS — C20 Malignant neoplasm of rectum: Secondary | ICD-10-CM | POA: Insufficient documentation

## 2013-03-21 DIAGNOSIS — K219 Gastro-esophageal reflux disease without esophagitis: Secondary | ICD-10-CM | POA: Insufficient documentation

## 2013-03-21 HISTORY — PX: PORTACATH PLACEMENT: SHX2246

## 2013-03-21 SURGERY — INSERTION, TUNNELED CENTRAL VENOUS DEVICE, WITH PORT
Anesthesia: General | Site: Chest | Laterality: Left

## 2013-03-21 MED ORDER — PROPOFOL 10 MG/ML IV BOLUS
INTRAVENOUS | Status: AC
  Filled 2013-03-21: qty 20

## 2013-03-21 MED ORDER — DEXAMETHASONE SODIUM PHOSPHATE 10 MG/ML IJ SOLN
INTRAMUSCULAR | Status: AC
  Filled 2013-03-21: qty 1

## 2013-03-21 MED ORDER — HEPARIN SOD (PORK) LOCK FLUSH 100 UNIT/ML IV SOLN
INTRAVENOUS | Status: DC | PRN
  Administered 2013-03-21: 500 [IU]

## 2013-03-21 MED ORDER — SODIUM CHLORIDE 0.9 % IV SOLN: 250.0000 mL | INTRAVENOUS | Status: DC | PRN

## 2013-03-21 MED ORDER — FENTANYL CITRATE 0.05 MG/ML IJ SOLN
INTRAMUSCULAR | Status: AC
  Filled 2013-03-21: qty 5

## 2013-03-21 MED ORDER — SODIUM CHLORIDE 0.9 % IJ SOLN: 3.0000 mL | Freq: Two times a day (BID) | INTRAMUSCULAR | Status: DC

## 2013-03-21 MED ORDER — KETOROLAC TROMETHAMINE 30 MG/ML IJ SOLN: 15.0000 mg | Freq: Once | INTRAMUSCULAR | Status: DC | PRN

## 2013-03-21 MED ORDER — SODIUM CHLORIDE 0.9 % IR SOLN
Freq: Once | Status: AC
  Administered 2013-03-21: 09:00:00
  Filled 2013-03-21: qty 1.2

## 2013-03-21 MED ORDER — ACETAMINOPHEN 325 MG PO TABS: 650.0000 mg | ORAL_TABLET | ORAL | Status: DC | PRN

## 2013-03-21 MED ORDER — LIDOCAINE HCL (CARDIAC) 20 MG/ML IV SOLN
INTRAVENOUS | Status: DC | PRN
  Administered 2013-03-21: 75 mg via INTRAVENOUS

## 2013-03-21 MED ORDER — ONDANSETRON HCL 4 MG/2ML IJ SOLN: 4.0000 mg | Freq: Four times a day (QID) | INTRAMUSCULAR | Status: DC | PRN

## 2013-03-21 MED ORDER — MIDAZOLAM HCL 2 MG/2ML IJ SOLN
INTRAMUSCULAR | Status: AC
  Filled 2013-03-21: qty 2

## 2013-03-21 MED ORDER — OXYCODONE HCL 5 MG PO TABS
5.0000 mg | ORAL_TABLET | ORAL | Status: DC | PRN
  Administered 2013-03-21: 5 mg via ORAL
  Filled 2013-03-21: qty 1

## 2013-03-21 MED ORDER — FENTANYL CITRATE 0.05 MG/ML IJ SOLN: 25.0000 ug | INTRAMUSCULAR | Status: DC | PRN

## 2013-03-21 MED ORDER — FENTANYL CITRATE 0.05 MG/ML IJ SOLN
INTRAMUSCULAR | Status: DC | PRN
  Administered 2013-03-21 (×3): 50 ug via INTRAVENOUS

## 2013-03-21 MED ORDER — LACTATED RINGERS IV SOLN
INTRAVENOUS | Status: DC
  Administered 2013-03-21: 1000 mL via INTRAVENOUS
  Administered 2013-03-21: 10:00:00 via INTRAVENOUS

## 2013-03-21 MED ORDER — DEXAMETHASONE SODIUM PHOSPHATE 10 MG/ML IJ SOLN
INTRAMUSCULAR | Status: DC | PRN
  Administered 2013-03-21: 10 mg via INTRAVENOUS

## 2013-03-21 MED ORDER — ACETAMINOPHEN 650 MG RE SUPP
650.0000 mg | RECTAL | Status: DC | PRN
  Filled 2013-03-21: qty 1

## 2013-03-21 MED ORDER — ONDANSETRON HCL 4 MG/2ML IJ SOLN
INTRAMUSCULAR | Status: DC | PRN
  Administered 2013-03-21: 4 mg via INTRAVENOUS

## 2013-03-21 MED ORDER — MIDAZOLAM HCL 5 MG/5ML IJ SOLN
INTRAMUSCULAR | Status: DC | PRN
  Administered 2013-03-21 (×2): 1 mg via INTRAVENOUS

## 2013-03-21 MED ORDER — PROMETHAZINE HCL 25 MG/ML IJ SOLN: 6.2500 mg | INTRAMUSCULAR | Status: DC | PRN

## 2013-03-21 MED ORDER — PROPOFOL 10 MG/ML IV BOLUS
INTRAVENOUS | Status: DC | PRN
  Administered 2013-03-21: 200 mg via INTRAVENOUS

## 2013-03-21 MED ORDER — NEOSTIGMINE METHYLSULFATE 1 MG/ML IJ SOLN
INTRAMUSCULAR | Status: AC
  Filled 2013-03-21: qty 10

## 2013-03-21 MED ORDER — HYDROCODONE-ACETAMINOPHEN 5-325 MG PO TABS: 1.0000 | ORAL_TABLET | ORAL | Status: DC | PRN

## 2013-03-21 MED ORDER — GLYCOPYRROLATE 0.2 MG/ML IJ SOLN
INTRAMUSCULAR | Status: AC
  Filled 2013-03-21: qty 3

## 2013-03-21 MED ORDER — ONDANSETRON HCL 4 MG/2ML IJ SOLN
INTRAMUSCULAR | Status: AC
  Filled 2013-03-21: qty 2

## 2013-03-21 MED ORDER — CLINDAMYCIN PHOSPHATE 900 MG/50ML IV SOLN
900.0000 mg | INTRAVENOUS | Status: AC
  Administered 2013-03-21: 900 mg via INTRAVENOUS

## 2013-03-21 MED ORDER — 0.9 % SODIUM CHLORIDE (POUR BTL) OPTIME
TOPICAL | Status: DC | PRN
  Administered 2013-03-21: 1000 mL

## 2013-03-21 MED ORDER — BUPIVACAINE-EPINEPHRINE PF 0.25-1:200000 % IJ SOLN
INTRAMUSCULAR | Status: AC
  Filled 2013-03-21: qty 30

## 2013-03-21 MED ORDER — CLINDAMYCIN PHOSPHATE 900 MG/50ML IV SOLN
INTRAVENOUS | Status: AC
  Filled 2013-03-21: qty 50

## 2013-03-21 MED ORDER — BUPIVACAINE-EPINEPHRINE 0.25% -1:200000 IJ SOLN
INTRAMUSCULAR | Status: DC | PRN
  Administered 2013-03-21: 9 mL

## 2013-03-21 MED ORDER — HEPARIN SOD (PORK) LOCK FLUSH 100 UNIT/ML IV SOLN
INTRAVENOUS | Status: AC
  Filled 2013-03-21: qty 5

## 2013-03-21 MED ORDER — SODIUM CHLORIDE 0.9 % IJ SOLN: 3.0000 mL | INTRAMUSCULAR | Status: DC | PRN

## 2013-03-21 SURGICAL SUPPLY — 38 items
BAG DECANTER FOR FLEXI CONT (MISCELLANEOUS) ×2 IMPLANT
BENZOIN TINCTURE PRP APPL 2/3 (GAUZE/BANDAGES/DRESSINGS) IMPLANT
BLADE SURG 15 STRL LF DISP TIS (BLADE) ×1 IMPLANT
BLADE SURG 15 STRL SS (BLADE) ×1
DECANTER SPIKE VIAL GLASS SM (MISCELLANEOUS) ×2 IMPLANT
DERMABOND ADVANCED (GAUZE/BANDAGES/DRESSINGS) ×1
DERMABOND ADVANCED .7 DNX12 (GAUZE/BANDAGES/DRESSINGS) ×1 IMPLANT
DRAPE C-ARM 42X120 X-RAY (DRAPES) ×2 IMPLANT
DRAPE LAPAROSCOPIC ABDOMINAL (DRAPES) ×2 IMPLANT
DRAPE UTILITY XL STRL (DRAPES) ×2 IMPLANT
DRSG TEGADERM 6X8 (GAUZE/BANDAGES/DRESSINGS) IMPLANT
ELECT REM PT RETURN 9FT ADLT (ELECTROSURGICAL) ×2
ELECTRODE REM PT RTRN 9FT ADLT (ELECTROSURGICAL) ×1 IMPLANT
GAUZE SPONGE 4X4 16PLY XRAY LF (GAUZE/BANDAGES/DRESSINGS) ×2 IMPLANT
GLOVE BIO SURGEON STRL SZ7.5 (GLOVE) ×2 IMPLANT
GLOVE BIOGEL M STRL SZ7.5 (GLOVE) IMPLANT
GLOVE INDICATOR 8.0 STRL GRN (GLOVE) ×2 IMPLANT
GOWN PREVENTION PLUS LG XLONG (DISPOSABLE) ×2 IMPLANT
GOWN STRL REIN XL XLG (GOWN DISPOSABLE) ×4 IMPLANT
KIT BASIN OR (CUSTOM PROCEDURE TRAY) ×2 IMPLANT
KIT POWER CATH 8FR (Catheter) ×2 IMPLANT
MARKER SKIN DUAL TIP RULER LAB (MISCELLANEOUS) IMPLANT
NEEDLE HYPO 22GX1.5 SAFETY (NEEDLE) ×2 IMPLANT
NEEDLE HYPO 25X1 1.5 SAFETY (NEEDLE) IMPLANT
NS IRRIG 1000ML POUR BTL (IV SOLUTION) ×2 IMPLANT
PACK BASIC VI WITH GOWN DISP (CUSTOM PROCEDURE TRAY) ×2 IMPLANT
PENCIL BUTTON HOLSTER BLD 10FT (ELECTRODE) ×2 IMPLANT
SPONGE GAUZE 4X4 12PLY (GAUZE/BANDAGES/DRESSINGS) IMPLANT
STRIP CLOSURE SKIN 1/2X4 (GAUZE/BANDAGES/DRESSINGS) IMPLANT
SUT MNCRL AB 4-0 PS2 18 (SUTURE) ×2 IMPLANT
SUT PROLENE 2 0 SH DA (SUTURE) ×2 IMPLANT
SUT VIC AB 2-0 SH 18 (SUTURE) ×2 IMPLANT
SUT VIC AB 3-0 SH 27 (SUTURE) ×1
SUT VIC AB 3-0 SH 27XBRD (SUTURE) ×1 IMPLANT
SYR BULB IRRIGATION 50ML (SYRINGE) IMPLANT
SYR CONTROL 10ML LL (SYRINGE) ×2 IMPLANT
SYRINGE 10CC LL (SYRINGE) ×2 IMPLANT
TOWEL OR 17X26 10 PK STRL BLUE (TOWEL DISPOSABLE) ×2 IMPLANT

## 2013-03-21 NOTE — Op Note (Signed)
03/21/2013  9:43 AM  PATIENT:  Jacob [name]  42 y.o. male  Patient Care Team: Bradd Canary, MD as PCP - General (Family Medicine) Rachael Fee, MD as Attending Physician (Gastroenterology) Cira Rue, RN as Registered Nurse Ladene Artist, MD as Consulting Physician (Oncology)  PRE-OPERATIVE DIAGNOSIS:  metastatic rectal cancer stage 4  POST-OPERATIVE DIAGNOSIS:  metastatic rectal cancer stage 4  PROCEDURE:  Procedure(s): INSERTION PORT-A-CATH  SURGEON:  Surgeon(s): Romie Levee, MD  ASSISTANT: none   ANESTHESIA:   spinal and MAC  EBL:  Total I/O In: 900 [I.V.:900] Out: -   DRAINS: none   SPECIMEN:  No Specimen  DISPOSITION OF SPECIMEN:  N/A  COUNTS:  YES  PLAN OF CARE: Discharge to home after PACU  PATIENT DISPOSITION:  PACU - hemodynamically stable.  INDICATION: 42 y.o. M with newly diagnosed stage 4 rectal cancer.   Patient with need for IV chemotherapy.   OR FINDINGS: normal appearing anatomy.  8 [place] power port. It goes through the left subclavian vein  DESCRIPTION: the patient was identified in the preoperative holding area and taken to the OR where they were laid supine on the operating room table with a shoulder roll between his shoulder blades.  MAC anesthesia was induced without difficulty. SCDs were also noted to be in place prior to the initiation of anesthesia.  The patient was then prepped and draped in the usual sterile fashion.    A surgical timeout was performed indicating the correct patient, procedure, positioning and need for preoperative antibiotics.   Procedure: Informed consent was confirmed. Patient was brought the operating room. and positioned supine. Arms were tucked. The patient underwent deep sedation. Neck and chest were clipped and prepped and draped in a sterile fashion. A surgical timeout confirmed our plan.  I placed a field block of local anesthesia on the chest.  I used the large bore needle to puncture the vein.  This was  done on the second attempt.  I made an incision in the lateral infraclavicular pocket, and I made a subcutaneous pocket. I tunneled the power port from the chest wound to the puncture site. The port was secured to the left anterior chest wall using 2-0 Prolene interrupted stitches x2. Catheter flushed well.  I used a dilator on the wire using Seldinger technique to dilate the tract under fluoroscopy.  I removed the wire and dilator. The sheath was pealed away after placement was confirmed using fluoroscopy.  I cut the catheter to appropriate length and connected it to the port with the plastic connector.  Fluoroscopy confirmed the tip in the distal SVC .   Catheter aspirated and flushed well. On final fluoro reevaluation the tip seen to be in good position in the distal SVC.    I closed the wounds using 4 Vicryl stitch & placed sterile dressings. Patient should go home later today. Catheter is okay to use.

## 2013-03-21 NOTE — H&P (Signed)
Jacob [name] is a 42 y.o. male with rectal cancer. He is ~4 mo s/p ileostomy reversal.  His urgency is better. He is going to physical therapy.  Cancer history: Rectal cancer. Partially obstructing mass noted 1-2 cm from the anal verge on a colonoscopy 03/06/2012. Endoscopic ultrasound 03/14/2012 with a 7.5 mm thick, 3.2 cm wide hypoechoic, irregularly bordered mass that clearly passed into and through the muscularis propria layer of the distal rectal wall (uT3); 3 small (largest 7 mm) perirectal lymph nodes. The lymph nodes were all round, discrete, hypoechoic, homogenous; suspicious for malignant involvement (uN1). He began radiation and concurrent Xeloda chemotherapy on 03/20/2012, completed 04/27/2012.  -low anterior resection/coloanal anastomosis and diverting ileostomy 06/29/2012 with the final pathology revealing a T2N0 tumor with extensive fibrosis and negative margins  -Cycle 1 of adjuvant CAPOX 07/19/2012; fifth and final cycle of adjuvant CAPOX beginning 10/11/2012.  -F/u CT's 12/14 show new right sided liver masses and a small new lung mass.  Dr Truett Perna has recommended chemotherapy  He is here today for port placement.  Past Medical History  Diagnosis Date  . Chicken pox as a child    ?  . Hernia 6 months old  . Allergic state 01/19/2012  . Reflux 01/19/2012  . Elevated liver function tests 01/19/2012  . Overweight(278.02) 01/19/2012  . Preventative health care 01/19/2012  . Sun-damaged skin 01/19/2012  . Migraine headache as a child  . Hx of migraines   . Radiation 03/20/12-04/27/12    Pelvis 50.4 gray Rectal cancer  . Anxiety   . Fatty liver   . 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 Cancer 03/06/12    rectal bx=Adenocarcinoma  PT HAD RADIATION , CHEMO SURGERY  . Numbness     TOES OF BOTH FEET.  . Pain     LEFT HEEL PAIN -SEVERE ESPECIALLY AFTER SITTING OR LYING DOWN - DIFFICULT TO GET OUT OF BED AND WALK IN THE MORNINGS BECAUSE OF HEEL PAIN  . Anemia  12/31/2012  . Dysrhythmia as child    HX OF IRREGULAR HB AT TIME OF TONSILLECTOMY - SURGERY WAS NOT DONE-CAN'T REMEMBER ANY OTHER DETAILS- NEVER HAD THE SURGERY.  . 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 Rectal biopsy  03/06/12    distal mass1-2cm from anal verge=Adenocarcinoma  . Eus  03/14/2012    Procedure: LOWER ENDOSCOPIC ULTRASOUND (EUS);  Surgeon: Rachael Fee, MD;  Location: [facility];  Service: Endoscopy;  Laterality: N/A;  . Hernia repair  6 months old    right inguinal repair  . Cyst excision      L EAR AREA  . Laparoscopic low anterior resection N/A 06/29/2012    Procedure: LAPAROSCOPIC LOW ANTERIOR RESECTION, mobilization splenic flexure,coloanal anastomosis,diverting ileostomy;  Surgeon: Romie Levee, MD;  Location: [facility];  Service: General;  Laterality: N/A;  . Ileostomy closure N/A 12/07/2012    Procedure: Anabel Bene TAKEDOWN;  Surgeon: Romie Levee, MD;  Location: [facility];  Service: General;  Laterality: N/A;     Medication List          ALPRAZolam 0.25 MG tablet  Commonly known as:  XANAX  Take 1 tablet (0.25 mg total) by mouth 2 (two) times daily as needed for sleep or anxiety.     B-complex with vitamin C tablet  Take 1 tablet by mouth daily.     docusate sodium 100 MG capsule  Commonly known as:  COLACE  Take 100 mg by mouth daily. Occasionally bid     PROBIOTIC DAILY PO  Take 1 tablet by mouth daily.       Allergies  Allergen Reactions  . Penicillins Rash    Does not recall    Review of Systems  Constitutional: Negative for fever and chills.  Respiratory: Negative for cough and shortness of breath.   Cardiovascular: Negative for chest pain.  Gastrointestinal: Negative for nausea, vomiting, abdominal pain and constipation.  Genitourinary: Negative for dysuria.  Skin: Negative for rash.  Neurological: Negative for headaches.     Filed  Vitals:   03/21/13 0632  BP: 134/84  Pulse: 63  Temp: 97.4 F (36.3 C)  Resp: 18    Physical Exam  Constitutional: He is oriented to person, place, and time. He appears well-developed and well-nourished.  HENT:  Head: Normocephalic and atraumatic.  Eyes: Pupils are equal, round, and reactive to light.  Neck: Neck supple.  Cardiovascular: Normal rate and regular rhythm.   Pulmonary/Chest: Effort normal and breath sounds normal.  Abdominal: Soft.  Musculoskeletal: Normal range of motion.  Neurological: He is alert and oriented to person, place, and time.  Skin: Skin is warm and dry.   Assessment: Metastatic rectal cancer  Plan:  OR for port placement for chemotherapy administration

## 2013-03-21 NOTE — Anesthesia Procedure Notes (Signed)
Procedure Name: LMA Insertion Date/Time: 03/21/2013 8:40 AM Performed by: Edison Pace Pre-anesthesia Checklist: Patient identified, Patient being monitored, Timeout performed, Emergency Drugs available and Suction available Patient Re-evaluated:Patient Re-evaluated prior to inductionOxygen Delivery Method: Circle system utilized Preoxygenation: Pre-oxygenation with 100% oxygen Intubation Type: IV induction LMA: LMA with gastric port inserted LMA Size: 4.0 Number of attempts: 1 Placement Confirmation: positive ETCO2 Tube secured with: Tape Dental Injury: Teeth and Oropharynx as per pre-operative assessment

## 2013-03-21 NOTE — Anesthesia Preprocedure Evaluation (Signed)
Anesthesia Evaluation  Patient identified by MRN, date of birth, ID band Patient awake    Reviewed: Allergy & Precautions, H&P , NPO status , Patient's Chart, lab work & pertinent test results  Airway Mallampati: II TM Distance: >3 FB Neck ROM: Full    Dental no notable dental hx.    Pulmonary neg pulmonary ROS,  breath sounds clear to auscultation  Pulmonary exam normal       Cardiovascular negative cardio ROS  Rhythm:Regular Rate:Normal     Neuro/Psych negative neurological ROS  negative psych ROS   GI/Hepatic negative GI ROS, Neg liver ROS,   Endo/Other  negative endocrine ROS  Renal/GU negative Renal ROS  negative genitourinary   Musculoskeletal negative musculoskeletal ROS (+)   Abdominal   Peds negative pediatric ROS (+)  Hematology negative hematology ROS (+)   Anesthesia Other Findings   Reproductive/Obstetrics negative OB ROS                          Anesthesia Physical Anesthesia Plan  ASA: II  Anesthesia Plan: General   Post-op Pain Management:    Induction: Intravenous  Airway Management Planned: LMA  Additional Equipment:   Intra-op Plan:   Post-operative Plan:   Informed Consent: I have reviewed the patients History and Physical, chart, labs and discussed the procedure including the risks, benefits and alternatives for the proposed anesthesia with the patient or authorized representative who has indicated his/her understanding and acceptance.   Dental advisory given  Plan Discussed with: CRNA and Surgeon  Anesthesia Plan Comments:         Anesthesia Quick Evaluation  

## 2013-03-21 NOTE — Transfer of Care (Signed)
Immediate Anesthesia Transfer of Care Note  Patient: Jacob [name]  Procedure(s) Performed: Procedure(s): INSERTION PORT-A-CATH (Left)  Patient Location: PACU  Anesthesia Type:General  Level of Consciousness: awake, oriented, patient cooperative, lethargic and responds to stimulation  Airway & Oxygen Therapy: Patient Spontanous Breathing and Patient connected to face mask oxygen  Post-op Assessment: Report given to PACU RN, Post -op Vital signs reviewed and stable and Patient moving all extremities  Post vital signs: Reviewed and stable  Complications: No apparent anesthesia complications

## 2013-03-21 NOTE — Preoperative (Signed)
Beta Blockers   Reason not to administer Beta Blockers:Not Applicable 

## 2013-03-21 NOTE — Anesthesia Postprocedure Evaluation (Signed)
  Anesthesia Post-op Note  Patient: Jacob [name]  Procedure(s) Performed: Procedure(s) (LRB): INSERTION PORT-A-CATH (Left)  Patient Location: PACU  Anesthesia Type: General  Level of Consciousness: awake and alert   Airway and Oxygen Therapy: Patient Spontanous Breathing  Post-op Pain: mild  Post-op Assessment: Post-op Vital signs reviewed, Patient's Cardiovascular Status Stable, Respiratory Function Stable, Patent Airway and No signs of Nausea or vomiting  Last Vitals:  Filed Vitals:   03/21/13 0945  BP: 119/68  Pulse: 58  Temp: 36.8 C  Resp:     Post-op Vital Signs: stable   Complications: No apparent anesthesia complications

## 2013-03-22 DIAGNOSIS — J984 Other disorders of lung: Secondary | ICD-10-CM

## 2013-03-22 HISTORY — DX: Other disorders of lung: J98.4

## 2013-03-23 ENCOUNTER — Telehealth (INDEPENDENT_AMBULATORY_CARE_PROVIDER_SITE_OTHER): Payer: Self-pay

## 2013-03-23 ENCOUNTER — Encounter (HOSPITAL_COMMUNITY): Payer: Self-pay | Admitting: General Surgery

## 2013-03-23 NOTE — Telephone Encounter (Signed)
Pt called stating he notice yesterday a slight rash under arms and on upper arms. No redness. No swelling. Not having any swallowing or breathing difficulties. Pt states rash is not where else. Pt will d/c narcotic pain meds and shower well to remove any residual prep solution. Pt will use benadryl 25 mg q 6 hours for rash and any itching per protocol. Pt will call back if rash increases or any other symptoms occur.

## 2013-03-23 NOTE — Progress Notes (Signed)
See Consent Form encounter for notes related to today's visit.

## 2013-03-27 ENCOUNTER — Other Ambulatory Visit: Payer: Self-pay | Admitting: *Deleted

## 2013-03-27 ENCOUNTER — Telehealth: Payer: Self-pay | Admitting: *Deleted

## 2013-03-27 NOTE — Progress Notes (Signed)
Per Dr. Benay Spice : Will see patient tomorrow at 12:00. POF to scheduler.

## 2013-03-27 NOTE — Telephone Encounter (Signed)
Left VM for patient to call office to give thought to going to chemo class again since it has been so long since his last class (03/09/12) and he is changing to IV chemo and pump. Have several classes before his chemo on 1/15. Call to schedule.

## 2013-03-28 ENCOUNTER — Telehealth: Payer: Self-pay | Admitting: Oncology

## 2013-03-28 ENCOUNTER — Other Ambulatory Visit: Payer: Self-pay | Admitting: *Deleted

## 2013-03-28 ENCOUNTER — Telehealth: Payer: Self-pay | Admitting: *Deleted

## 2013-03-28 DIAGNOSIS — C20 Malignant neoplasm of rectum: Secondary | ICD-10-CM

## 2013-03-28 NOTE — Telephone Encounter (Signed)
Called pt and left message for MD visit and lab for 03/29/13

## 2013-03-28 NOTE — Telephone Encounter (Signed)
Per staff from research nurse I have schedueld appts

## 2013-03-28 NOTE — Telephone Encounter (Signed)
Received return phone call from patient stating that he is unable to make appointment tomorrow as he is out of town in [place]. Patient also notes that his preference is to keep treatments on Thursdays; explained the timing of CT scan prior to treatment start, and noted that we will work with him to see if we can come up with an acceptable solution. Spoke with Dr. Gearldine Shown nurse Rosalio Macadamia noting that patient cannot come in for appointment tomorrow and asking that appointments be moved to Monday 1/12 instead. Cindy S. Brigitte Pulse BSN, RN, [facility] 03/28/2013 3:46 PM

## 2013-03-28 NOTE — Telephone Encounter (Signed)
Left message on patient's voice mail to notify of change to chemotherapy appointment to Tuesday 1/13, with preceding lab at 12:15, if okay with patient. Cindy S. Brigitte Pulse BSN, RN, [place] 03/28/2013 2:15 PM

## 2013-03-29 ENCOUNTER — Ambulatory Visit: Payer: BC Managed Care – PPO | Admitting: Oncology

## 2013-03-29 ENCOUNTER — Telehealth: Payer: Self-pay | Admitting: Oncology

## 2013-03-29 ENCOUNTER — Telehealth: Payer: Self-pay | Admitting: *Deleted

## 2013-03-29 ENCOUNTER — Other Ambulatory Visit: Payer: Self-pay | Admitting: *Deleted

## 2013-03-29 ENCOUNTER — Other Ambulatory Visit: Payer: BC Managed Care – PPO

## 2013-03-29 NOTE — Telephone Encounter (Signed)
On 03-29-13 fax medical records to [facility] care , it was consult note, end of tx note, last follow up from 05-31-12.

## 2013-03-29 NOTE — Telephone Encounter (Signed)
Per staff message I have cahnges appts

## 2013-03-29 NOTE — Telephone Encounter (Signed)
03/29/2013 Spoke with patient today regarding subsequent changes to his schedule. Patient will be seen on Monday 1/12 at 3:30pm for labwork and physical exam. Approval received from [facility] to begin treatment on Thursday 1/15, so appointments scheduled for 1/13 will be cancelled. Patient will not need another physical exam on Thursday 1/15, so appointment will be cancelled and lab appointment (research samples only, may be collected via port) will be moved to 10:30am prior to treatment. Patient verbalized understanding of these changes. Cindy S. Brigitte Pulse BSN, RN, CCRP 03/29/2013 2:07 PM

## 2013-03-29 NOTE — Telephone Encounter (Signed)
Talked to pt and gave him lab and MD appt for 04/02/13

## 2013-03-30 ENCOUNTER — Encounter: Payer: BC Managed Care – PPO | Admitting: Family Medicine

## 2013-04-02 ENCOUNTER — Ambulatory Visit: Payer: BC Managed Care – PPO | Admitting: Physical Therapy

## 2013-04-02 ENCOUNTER — Other Ambulatory Visit (HOSPITAL_BASED_OUTPATIENT_CLINIC_OR_DEPARTMENT_OTHER): Payer: BC Managed Care – PPO

## 2013-04-02 ENCOUNTER — Ambulatory Visit (HOSPITAL_BASED_OUTPATIENT_CLINIC_OR_DEPARTMENT_OTHER): Payer: BC Managed Care – PPO | Admitting: Oncology

## 2013-04-02 VITALS — BP 136/76 | HR 65 | Temp 98.7°F | Resp 18 | Ht 70.0 in | Wt 190.0 lb

## 2013-04-02 DIAGNOSIS — K625 Hemorrhage of anus and rectum: Secondary | ICD-10-CM

## 2013-04-02 DIAGNOSIS — G620 Drug-induced polyneuropathy: Secondary | ICD-10-CM

## 2013-04-02 DIAGNOSIS — K7689 Other specified diseases of liver: Secondary | ICD-10-CM

## 2013-04-02 DIAGNOSIS — R748 Abnormal levels of other serum enzymes: Secondary | ICD-10-CM

## 2013-04-02 DIAGNOSIS — C20 Malignant neoplasm of rectum: Secondary | ICD-10-CM

## 2013-04-02 LAB — CBC WITH DIFFERENTIAL/PLATELET
BASO%: 0.2 % (ref 0.0–2.0)
Basophils Absolute: 0 10*3/uL (ref 0.0–0.1)
EOS%: 0.8 % (ref 0.0–7.0)
Eosinophils Absolute: 0 10*3/uL (ref 0.0–0.5)
HCT: 40.3 % (ref 38.4–49.9)
HGB: 13.8 g/dL (ref 13.0–17.1)
LYMPH%: 11.9 % — AB (ref 14.0–49.0)
MCH: 29.8 pg (ref 27.2–33.4)
MCHC: 34.2 g/dL (ref 32.0–36.0)
MCV: 87 fL (ref 79.3–98.0)
MONO#: 0.5 10*3/uL (ref 0.1–0.9)
MONO%: 10 % (ref 0.0–14.0)
NEUT#: 4 10*3/uL (ref 1.5–6.5)
NEUT%: 77.1 % — AB (ref 39.0–75.0)
PLATELETS: 115 10*3/uL — AB (ref 140–400)
RBC: 4.63 10*6/uL (ref 4.20–5.82)
RDW: 13.6 % (ref 11.0–14.6)
WBC: 5.2 10*3/uL (ref 4.0–10.3)
lymph#: 0.6 10*3/uL — ABNORMAL LOW (ref 0.9–3.3)
nRBC: 0 % (ref 0–0)

## 2013-04-02 LAB — COMPREHENSIVE METABOLIC PANEL (CC13)
ALBUMIN: 4 g/dL (ref 3.5–5.0)
ALK PHOS: 64 U/L (ref 40–150)
ALT: 76 U/L — ABNORMAL HIGH (ref 0–55)
AST: 42 U/L — ABNORMAL HIGH (ref 5–34)
Anion Gap: 11 mEq/L (ref 3–11)
BILIRUBIN TOTAL: 0.36 mg/dL (ref 0.20–1.20)
BUN: 14.3 mg/dL (ref 7.0–26.0)
CO2: 26 mEq/L (ref 22–29)
Calcium: 9.1 mg/dL (ref 8.4–10.4)
Chloride: 107 mEq/L (ref 98–109)
Creatinine: 0.9 mg/dL (ref 0.7–1.3)
GLUCOSE: 94 mg/dL (ref 70–140)
Potassium: 3.6 mEq/L (ref 3.5–5.1)
Sodium: 143 mEq/L (ref 136–145)
Total Protein: 6.6 g/dL (ref 6.4–8.3)

## 2013-04-02 LAB — URINALYSIS, MICROSCOPIC - CHCC
Bilirubin (Urine): NEGATIVE
Blood: NEGATIVE
GLUCOSE UR CHCC: NEGATIVE mg/dL
KETONES: NEGATIVE mg/dL
Leukocyte Esterase: NEGATIVE
Nitrite: NEGATIVE
Protein: <30 mg/dL
RBC / HPF: NEGATIVE (ref 0–2)
Specific Gravity, Urine: 1.02 (ref 1.003–1.035)
UROBILINOGEN UR: 0.2 mg/dL (ref 0.2–1)
WBC UA: NEGATIVE (ref 0–2)
pH: 6.5 (ref 4.6–8.0)

## 2013-04-02 LAB — MAGNESIUM (CC13): Magnesium: 2.1 mg/dl (ref 1.5–2.5)

## 2013-04-02 MED ORDER — LIDOCAINE-PRILOCAINE 2.5-2.5 % EX CREA: 1.0000 "application " | TOPICAL_CREAM | CUTANEOUS | Status: DC | PRN

## 2013-04-02 NOTE — Progress Notes (Signed)
Jacob [name]    OFFICE PROGRESS NOTE   INTERVAL HISTORY:   Jacob [name] returns as scheduled. He underwent placement of a Port-A-Cath on 03/21/2013. He continues to have irregular bowel habits. Pelvic physical therapy has helped. He continues to have neuropathy symptoms in the feet.  Jacob [name] saw Dr. Altamease Oiler on 04/01/2013.  Objective:  Vital signs in last 24 hours:  Blood pressure 136/76, pulse 65, temperature 98.7 F (37.1 C), temperature source Oral, resp. rate 18, height 5\' 10"  (1.778 m), weight 190 lb (86.183 kg).    HEENT: neck without mass Lymphatics: no cervical, supraclavicular, axillary, or inguinal nodes Resp: lungs clear bilaterally Cardio: regular rate and rhythm GI: no hepatomegaly, nontender, no mass Vascular: no leg edema    Portacath/PICC-without erythema  Lab Results:  Lab Results  Component Value Date   WBC 5.2 04/02/2013   HGB 13.8 04/02/2013   HCT 40.3 04/02/2013   MCV 87.0 04/02/2013   PLT 115* 04/02/2013   NEUTROABS 4.0 04/02/2013      Medications: I have reviewed the patient's current medications.  Assessment/Plan: 1. Rectal cancer. Partially obstructing mass noted 1-2 cm from the anal verge on a colonoscopy 03/06/2012. Endoscopic ultrasound 03/14/2012 with a 7.5 mm thick, 3.2 cm wide hypoechoic, irregularly bordered mass that clearly passed into and through the muscularis propria layer of the distal rectal wall (uT3); 3 small (largest 7 mm) perirectal lymph nodes. The lymph nodes were all round, discrete, hypoechoic, homogenous; suspicious for malignant involvement (uN1).  He began radiation and concurrent Xeloda chemotherapy on 03/20/2012, completed 04/27/2012.  Low anterior resection/coloanal anastomosis and diverting ileostomy 06/29/2012 with the final pathology revealing a T2N0 tumor with extensive fibrosis and negative margins.  Cycle 1 of adjuvant CAPOX 07/19/2012. Cycle 5 of adjuvant CAPOX 10/11/2012.  CEA 2.5 on 11/14/2012.    CEA 14.6 03/08/2013.  Restaging CT evaluation 03/08/2013 with a 4 mm pulmonary nodule left lung base not identified on comparison exam; new liver lesions including a 29 x 26 mm irregular peripheral enhancing rounded lesion in the dome of the right hepatic lobe, a less well-defined new subcapsular lesion in the lateral right hepatic lobe measuring 12 mm, a new subcapsular lesion in the anterior right hepatic lobe adjacent to the gallbladder fossa measuring 10 mm; a rounded low-density lesion in the inferior right hepatic lobe measuring 10 mm compared to 7 mm on the prior study (radiologist commented this may represent an enlarged cyst). 2. Irregular bowel habits/rectal bleeding secondary to #1. 3. Mild elevation of the liver enzymes. Question secondary to hepatic steatosis. 4. Indeterminate 8 mm posterior right liver lesion on the staging CT 03/06/2012. 5. Mildly elevated CEA at 5.7 on 03/06/2012. 6. History of radiation erythema at the groin and perineum 7. 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 8. 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 No clinical evidence of thrombophlebitis or extravasation. 9. Delayed nausea following chemotherapy-Decadron prophylaxis was added with cycle 3 CAPOX-improved. 10. Ileostomy takedown 12/07/2012. 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 12. Port-A-Cath placement 12/31/204   Disposition:  Jacob [name] appears stable. The plan is to proceed with FOLFIRI/Avastin therapy on the [facility] irinotecan pharmacogenomic study.we again reviewed the toxicities associated with this regimen and he agrees to proceed with a first cycle of chemotherapy 04/05/2013.  He saw Dr. Altamease Oiler and reports his case will be presented at the [facility] multidisciplinary GI conference. We presented his case at the GI tumor conference here 2 weeks  ago and a hepatic MRI was recommended prior to  considering resection of the liver metastases. We will plan for an MRI of the liver after 3-4 cycles of systemic therapy or as recommended by Dr. Altamease Oiler.   Betsy Coder, MD  04/02/2013  7:43 PM

## 2013-04-02 NOTE — Progress Notes (Signed)
Provided printed hand out and explained how to apply EMLA cream for PAC access. Was able to tell nurse procedure back.

## 2013-04-03 ENCOUNTER — Other Ambulatory Visit: Payer: BC Managed Care – PPO

## 2013-04-03 ENCOUNTER — Telehealth: Payer: Self-pay | Admitting: Oncology

## 2013-04-03 ENCOUNTER — Ambulatory Visit: Payer: BC Managed Care – PPO

## 2013-04-03 LAB — CEA: CEA: 18 ng/mL — AB (ref 0.0–5.0)

## 2013-04-03 NOTE — Telephone Encounter (Signed)
s.w. pt and advised on Jan 15th lab cx per MD

## 2013-04-04 ENCOUNTER — Other Ambulatory Visit: Payer: Self-pay | Admitting: *Deleted

## 2013-04-04 ENCOUNTER — Ambulatory Visit: Payer: BC Managed Care – PPO | Attending: Oncology | Admitting: Physical Therapy

## 2013-04-04 DIAGNOSIS — IMO0001 Reserved for inherently not codable concepts without codable children: Secondary | ICD-10-CM | POA: Insufficient documentation

## 2013-04-04 DIAGNOSIS — M2420 Disorder of ligament, unspecified site: Secondary | ICD-10-CM | POA: Insufficient documentation

## 2013-04-04 DIAGNOSIS — C20 Malignant neoplasm of rectum: Secondary | ICD-10-CM

## 2013-04-04 DIAGNOSIS — M629 Disorder of muscle, unspecified: Secondary | ICD-10-CM | POA: Insufficient documentation

## 2013-04-05 ENCOUNTER — Ambulatory Visit (HOSPITAL_BASED_OUTPATIENT_CLINIC_OR_DEPARTMENT_OTHER): Payer: BC Managed Care – PPO

## 2013-04-05 ENCOUNTER — Encounter: Payer: BC Managed Care – PPO | Admitting: *Deleted

## 2013-04-05 ENCOUNTER — Other Ambulatory Visit: Payer: Self-pay | Admitting: Oncology

## 2013-04-05 ENCOUNTER — Other Ambulatory Visit: Payer: BC Managed Care – PPO

## 2013-04-05 ENCOUNTER — Ambulatory Visit: Payer: BC Managed Care – PPO | Admitting: Nurse Practitioner

## 2013-04-05 VITALS — BP 142/86 | HR 66 | Temp 97.5°F

## 2013-04-05 DIAGNOSIS — C20 Malignant neoplasm of rectum: Secondary | ICD-10-CM

## 2013-04-05 DIAGNOSIS — Z5112 Encounter for antineoplastic immunotherapy: Secondary | ICD-10-CM

## 2013-04-05 MED ORDER — DEXAMETHASONE SODIUM PHOSPHATE 20 MG/5ML IJ SOLN
INTRAMUSCULAR | Status: AC
  Filled 2013-04-05: qty 5

## 2013-04-05 MED ORDER — SODIUM CHLORIDE 0.9 % IV SOLN
5.0000 mg/kg | Freq: Once | INTRAVENOUS | Status: AC
  Administered 2013-04-05: 425 mg via INTRAVENOUS
  Filled 2013-04-05: qty 17

## 2013-04-05 MED ORDER — FLUOROURACIL CHEMO INJECTION 2.5 GM/50ML
400.0000 mg/m2 | Freq: Once | INTRAVENOUS | Status: AC
  Administered 2013-04-05: 800 mg via INTRAVENOUS
  Filled 2013-04-05: qty 16

## 2013-04-05 MED ORDER — ATROPINE SULFATE 1 MG/ML IJ SOLN
INTRAMUSCULAR | Status: AC
  Filled 2013-04-05: qty 1

## 2013-04-05 MED ORDER — ONDANSETRON 16 MG/50ML IVPB (CHCC)
16.0000 mg | Freq: Once | INTRAVENOUS | Status: AC
  Administered 2013-04-05: 16 mg via INTRAVENOUS

## 2013-04-05 MED ORDER — SODIUM CHLORIDE 0.9 % IV SOLN
Freq: Once | INTRAVENOUS | Status: AC
  Administered 2013-04-05: 11:00:00 via INTRAVENOUS

## 2013-04-05 MED ORDER — ONDANSETRON 16 MG/50ML IVPB (CHCC)
INTRAVENOUS | Status: AC
  Filled 2013-04-05: qty 16

## 2013-04-05 MED ORDER — DEXAMETHASONE SODIUM PHOSPHATE 20 MG/5ML IJ SOLN
20.0000 mg | Freq: Once | INTRAMUSCULAR | Status: AC
  Administered 2013-04-05: 20 mg via INTRAVENOUS

## 2013-04-05 MED ORDER — LEUCOVORIN CALCIUM INJECTION 350 MG
400.0000 mg/m2 | Freq: Once | INTRAVENOUS | Status: AC
  Administered 2013-04-05: 824 mg via INTRAVENOUS
  Filled 2013-04-05: qty 41.2

## 2013-04-05 MED ORDER — SODIUM CHLORIDE 0.9 % IV SOLN
2400.0000 mg/m2 | INTRAVENOUS | Status: DC
  Administered 2013-04-05: 4950 mg via INTRAVENOUS
  Filled 2013-04-05: qty 99

## 2013-04-05 MED ORDER — SODIUM CHLORIDE 0.9 % IV SOLN
2420.0000 mg/m2 | INTRAVENOUS | Status: DC
  Filled 2013-04-05: qty 100

## 2013-04-05 MED ORDER — DEXTROSE 5 % IV SOLN
310.0000 mg/m2 | Freq: Once | INTRAVENOUS | Status: AC
Start: 2013-04-05 — End: 2013-04-05
  Administered 2013-04-05: 638 mg via INTRAVENOUS
  Filled 2013-04-05: qty 31.9

## 2013-04-05 MED ORDER — DEXTROSE 5 % IV SOLN: 311.0000 mg/m2 | Freq: Once | INTRAVENOUS | Status: DC

## 2013-04-05 MED ORDER — ATROPINE SULFATE 1 MG/ML IJ SOLN
0.5000 mg | Freq: Once | INTRAMUSCULAR | Status: AC | PRN
  Administered 2013-04-05: 0.5 mg via INTRAVENOUS

## 2013-04-05 MED ORDER — LEUCOVORIN CALCIUM INJECTION 350 MG
388.0000 mg/m2 | Freq: Once | INTRAMUSCULAR | Status: DC
  Filled 2013-04-05: qty 40

## 2013-04-05 NOTE — Progress Notes (Signed)
04/05/2013 Patient in to clinic today accompanied by his wife, [name]. Patient was notified that research blood samples will be drawn by the infusion nurse via his port, prior to treatment initiation. Patient was notified of *1/*1 genotype results, meaning that he will be given the higher dose of irinotecan. Instructed patient to contact the clinic at [phone] 24/7 for any problems he may experience. Discussed potential side effects including neutropenia and diarrhea. Patient verbalized measures to take for uncomplicated diarrhea, including use of Imodium, and was instructed to call the clinic at any time, including after hours, if diarrhea is not controlled with those measures.  Sign for infusion and protocol information regarding genotype-based irinotecan dosing was given to infusion nurse Sabino Gasser, RN.  Cindy S. Brigitte Pulse BSN, RN, [place] 04/05/2013 11:36 AM

## 2013-04-05 NOTE — Patient Instructions (Signed)
[facility] Discharge Instructions for Patients Receiving Chemotherapy  Today you received the following chemotherapy agents Avastin/Irinotecan/Leucovorin/5FU  To help prevent nausea and vomiting after your treatment, we encourage you to take your nausea medication as prescribed.   If you develop nausea and vomiting that is not controlled by your nausea medication, call the clinic.   BELOW ARE SYMPTOMS THAT SHOULD BE REPORTED IMMEDIATELY:  *FEVER GREATER THAN 100.5 F  *CHILLS WITH OR WITHOUT FEVER  NAUSEA AND VOMITING THAT IS NOT CONTROLLED WITH YOUR NAUSEA MEDICATION  *UNUSUAL SHORTNESS OF BREATH  *UNUSUAL BRUISING OR BLEEDING  TENDERNESS IN MOUTH AND THROAT WITH OR WITHOUT PRESENCE OF ULCERS  *URINARY PROBLEMS  *BOWEL PROBLEMS  UNUSUAL RASH Items with * indicate a potential emergency and should be followed up as soon as possible.  Feel free to call the clinic you have any questions or concerns. The clinic phone number is [phone].

## 2013-04-06 ENCOUNTER — Other Ambulatory Visit: Payer: Self-pay | Admitting: Oncology

## 2013-04-06 ENCOUNTER — Telehealth: Payer: Self-pay | Admitting: *Deleted

## 2013-04-06 ENCOUNTER — Telehealth: Payer: Self-pay | Admitting: Oncology

## 2013-04-06 DIAGNOSIS — C20 Malignant neoplasm of rectum: Secondary | ICD-10-CM

## 2013-04-06 NOTE — Telephone Encounter (Addendum)
Call from pt reporting he is scheduled for his routine check up with PCP on 1/19. Asking if he should keep this appt since he will potentially be exposed to sick people in that office. Reviewed with Dr. Benay Spice: does not need to keep check up appt at this time. Being followed closely at [facility]. Pt made aware. Reports he is having mild nausea. Compazine effective. He understands to call office for N/V not relieved with Compazine.

## 2013-04-06 NOTE — Telephone Encounter (Signed)
added appts for 2/12 and 2/14. s/w pt he is aware and will get schedule @ next visit 1/17 or 1/29. central will contact pt re scan appts. no referrals entered.

## 2013-04-07 ENCOUNTER — Telehealth: Payer: Self-pay | Admitting: *Deleted

## 2013-04-07 ENCOUNTER — Ambulatory Visit (HOSPITAL_BASED_OUTPATIENT_CLINIC_OR_DEPARTMENT_OTHER): Payer: BC Managed Care – PPO

## 2013-04-07 VITALS — BP 132/80 | HR 73 | Temp 97.0°F | Resp 17

## 2013-04-07 DIAGNOSIS — Z452 Encounter for adjustment and management of vascular access device: Secondary | ICD-10-CM

## 2013-04-07 DIAGNOSIS — C20 Malignant neoplasm of rectum: Secondary | ICD-10-CM

## 2013-04-07 MED ORDER — SODIUM CHLORIDE 0.9 % IJ SOLN
10.0000 mL | INTRAMUSCULAR | Status: DC | PRN
  Administered 2013-04-07: 10 mL via INTRAVENOUS
  Filled 2013-04-07: qty 10

## 2013-04-07 MED ORDER — HEPARIN SOD (PORK) LOCK FLUSH 100 UNIT/ML IV SOLN
500.0000 [IU] | Freq: Once | INTRAVENOUS | Status: AC
  Administered 2013-04-07: 500 [IU] via INTRAVENOUS
  Filled 2013-04-07: qty 5

## 2013-04-07 NOTE — Patient Instructions (Signed)

## 2013-04-07 NOTE — Telephone Encounter (Signed)
Vomited X 3 night of chemo--doing well today-still taking his antiemetic. Has not had BM in 2+ days. Doubled up on his stool softener and will add MiraLax today.

## 2013-04-09 ENCOUNTER — Other Ambulatory Visit: Payer: Self-pay | Admitting: *Deleted

## 2013-04-09 ENCOUNTER — Encounter: Payer: BC Managed Care – PPO | Admitting: Family Medicine

## 2013-04-09 DIAGNOSIS — C20 Malignant neoplasm of rectum: Secondary | ICD-10-CM

## 2013-04-09 MED ORDER — ALPRAZOLAM 0.25 MG PO TABS
0.2500 mg | ORAL_TABLET | Freq: Two times a day (BID) | ORAL | Status: DC | PRN
Start: 2013-04-09 — End: 2013-09-12

## 2013-04-09 NOTE — Telephone Encounter (Signed)
Patient reported at pump d/c on Saturday that his PCP requested Dr. Benay Spice refill Xanax since he was under Dr. Gearldine Shown care at this time. Refill called in as requested.

## 2013-04-10 ENCOUNTER — Encounter: Payer: Self-pay | Admitting: *Deleted

## 2013-04-11 ENCOUNTER — Ambulatory Visit (HOSPITAL_COMMUNITY)
Admission: RE | Admit: 2013-04-11 | Discharge: 2013-04-11 | Disposition: A | Discharge status: Home / Self Care | Payer: BC Managed Care – PPO | Source: Ambulatory Visit | Attending: Oncology | Admitting: Oncology

## 2013-04-11 DIAGNOSIS — K7689 Other specified diseases of liver: Secondary | ICD-10-CM | POA: Insufficient documentation

## 2013-04-11 DIAGNOSIS — C20 Malignant neoplasm of rectum: Secondary | ICD-10-CM | POA: Insufficient documentation

## 2013-04-11 DIAGNOSIS — C787 Secondary malignant neoplasm of liver and intrahepatic bile duct: Secondary | ICD-10-CM | POA: Insufficient documentation

## 2013-04-11 MED ORDER — GADOBENATE DIMEGLUMINE 529 MG/ML IV SOLN
17.0000 mL | Freq: Once | INTRAVENOUS | Status: AC | PRN
  Administered 2013-04-11: 17 mL via INTRAVENOUS

## 2013-04-15 ENCOUNTER — Other Ambulatory Visit: Payer: Self-pay | Admitting: Oncology

## 2013-04-19 ENCOUNTER — Telehealth: Payer: Self-pay | Admitting: Oncology

## 2013-04-19 ENCOUNTER — Encounter: Payer: BC Managed Care – PPO | Admitting: *Deleted

## 2013-04-19 ENCOUNTER — Telehealth: Payer: Self-pay | Admitting: *Deleted

## 2013-04-19 ENCOUNTER — Other Ambulatory Visit (HOSPITAL_BASED_OUTPATIENT_CLINIC_OR_DEPARTMENT_OTHER): Payer: BC Managed Care – PPO

## 2013-04-19 ENCOUNTER — Ambulatory Visit: Payer: BC Managed Care – PPO

## 2013-04-19 ENCOUNTER — Ambulatory Visit (HOSPITAL_BASED_OUTPATIENT_CLINIC_OR_DEPARTMENT_OTHER): Payer: BC Managed Care – PPO | Admitting: Oncology

## 2013-04-19 VITALS — BP 133/87 | HR 68 | Temp 97.4°F | Resp 18 | Ht 70.0 in | Wt 181.3 lb

## 2013-04-19 DIAGNOSIS — C20 Malignant neoplasm of rectum: Secondary | ICD-10-CM

## 2013-04-19 DIAGNOSIS — T451X5A Adverse effect of antineoplastic and immunosuppressive drugs, initial encounter: Secondary | ICD-10-CM

## 2013-04-19 DIAGNOSIS — R112 Nausea with vomiting, unspecified: Secondary | ICD-10-CM

## 2013-04-19 DIAGNOSIS — G622 Polyneuropathy due to other toxic agents: Secondary | ICD-10-CM

## 2013-04-19 DIAGNOSIS — C787 Secondary malignant neoplasm of liver and intrahepatic bile duct: Secondary | ICD-10-CM

## 2013-04-19 DIAGNOSIS — D702 Other drug-induced agranulocytosis: Secondary | ICD-10-CM

## 2013-04-19 LAB — COMPREHENSIVE METABOLIC PANEL (CC13)
ALBUMIN: 4 g/dL (ref 3.5–5.0)
ALT: 87 U/L — ABNORMAL HIGH (ref 0–55)
ANION GAP: 10 meq/L (ref 3–11)
AST: 46 U/L — AB (ref 5–34)
Alkaline Phosphatase: 63 U/L (ref 40–150)
BUN: 12.7 mg/dL (ref 7.0–26.0)
CO2: 26 mEq/L (ref 22–29)
Calcium: 9.3 mg/dL (ref 8.4–10.4)
Chloride: 108 mEq/L (ref 98–109)
Creatinine: 0.9 mg/dL (ref 0.7–1.3)
Glucose: 102 mg/dl (ref 70–140)
POTASSIUM: 3.7 meq/L (ref 3.5–5.1)
Sodium: 144 mEq/L (ref 136–145)
Total Bilirubin: 0.31 mg/dL (ref 0.20–1.20)
Total Protein: 6.4 g/dL (ref 6.4–8.3)

## 2013-04-19 LAB — UA PROTEIN, DIPSTICK - CHCC: Protein, ur: <30 mg/dL

## 2013-04-19 LAB — CBC WITH DIFFERENTIAL/PLATELET
BASO%: 0 % (ref 0.0–2.0)
BASOS ABS: 0 10*3/uL (ref 0.0–0.1)
EOS%: 8.4 % — AB (ref 0.0–7.0)
Eosinophils Absolute: 0.1 10*3/uL (ref 0.0–0.5)
HEMATOCRIT: 39.4 % (ref 38.4–49.9)
HEMOGLOBIN: 13.3 g/dL (ref 13.0–17.1)
LYMPH#: 0.3 10*3/uL — AB (ref 0.9–3.3)
LYMPH%: 41 % (ref 14.0–49.0)
MCH: 29.5 pg (ref 27.2–33.4)
MCHC: 33.8 g/dL (ref 32.0–36.0)
MCV: 87.4 fL (ref 79.3–98.0)
MONO#: 0.2 10*3/uL (ref 0.1–0.9)
MONO%: 27.7 % — ABNORMAL HIGH (ref 0.0–14.0)
NEUT#: 0.2 10*3/uL — CL (ref 1.5–6.5)
NEUT%: 22.9 % — ABNORMAL LOW (ref 39.0–75.0)
Platelets: 105 10*3/uL — ABNORMAL LOW (ref 140–400)
RBC: 4.51 10*6/uL (ref 4.20–5.82)
RDW: 14.1 % (ref 11.0–14.6)
WBC: 0.8 10*3/uL — AB (ref 4.0–10.3)

## 2013-04-19 LAB — MAGNESIUM (CC13): Magnesium: 2.3 mg/dl (ref 1.5–2.5)

## 2013-04-19 MED ORDER — CIPROFLOXACIN HCL 500 MG PO TABS: 500.0000 mg | ORAL_TABLET | Freq: Two times a day (BID) | ORAL | Status: DC

## 2013-04-19 MED ORDER — DEXAMETHASONE 4 MG PO TABS: ORAL_TABLET | ORAL | Status: DC

## 2013-04-19 NOTE — Telephone Encounter (Signed)
Per staff message and POF I have scheduled appts.  JMW  

## 2013-04-19 NOTE — Telephone Encounter (Signed)
Gave pt appt for lab and MD for 2/5 emailed michelle regarding chemo

## 2013-04-19 NOTE — Telephone Encounter (Signed)
Per staff message from research I have scheduled appt

## 2013-04-19 NOTE — Progress Notes (Signed)
[facility]    OFFICE PROGRESS NOTE   INTERVAL HISTORY:   Jacob [name] returns for scheduled followup of metastatic rectal cancer. He completed a first cycle of FOLFIRI/Avastin 04/05/2013. He reports nausea and vomiting on 3 occasions over the 24 hours following chemotherapy. No significant diarrhea. He reports constipation over the past week. No mouth sores.  Objective:  Vital signs in last 24 hours:  Blood pressure 133/87, pulse 68, temperature 97.4 F (36.3 C), temperature source Oral, resp. rate 18, height 5\' 10"  (1.778 m), weight 181 lb 4.8 oz (82.237 kg).    HEENT: No thrush or ulcer Resp: Lungs clear bilaterally Cardio: Regular rate and rhythm GI: No hepatomegaly, nontender Vascular: No leg edema  Skin: Palms without erythema   Portacath/PICC-without erythema  Lab Results:  Lab Results  Component Value Date   WBC 0.8* 04/19/2013   HGB 13.3 04/19/2013   HCT 39.4 04/19/2013   MCV 87.4 04/19/2013   PLT 105* 04/19/2013   NEUTROABS 0.2* 04/19/2013      Medications: I have reviewed the patient's current medications.  Assessment/Plan: 1. Rectal cancer. Partially obstructing mass noted 1-2 cm from the anal verge on a colonoscopy 03/06/2012. Endoscopic ultrasound 03/14/2012 with a 7.5 mm thick, 3.2 cm wide hypoechoic, irregularly bordered mass that clearly passed into and through the muscularis propria layer of the distal rectal wall (uT3); 3 small (largest 7 mm) perirectal lymph nodes. The lymph nodes were all round, discrete, hypoechoic, homogenous; suspicious for malignant involvement (uN1).  He began radiation and concurrent Xeloda chemotherapy on 03/20/2012, completed 04/27/2012.  Low anterior resection/coloanal anastomosis and diverting ileostomy 06/29/2012 with the final pathology revealing a T2N0 tumor with extensive fibrosis and negative margins.  Cycle 1 of adjuvant CAPOX 07/19/2012. Cycle 5 of adjuvant CAPOX 10/11/2012.  CEA 2.5 on 11/14/2012.  CEA  14.6 03/08/2013.  Restaging CT evaluation 03/08/2013 with a 4 mm pulmonary nodule left lung base not identified on comparison exam; new liver lesions including a 29 x 26 mm irregular peripheral enhancing rounded lesion in the dome of the right hepatic lobe, a less well-defined new subcapsular lesion in the lateral right hepatic lobe measuring 12 mm, a new subcapsular lesion in the anterior right hepatic lobe adjacent to the gallbladder fossa measuring 10 mm; a rounded low-density lesion in the inferior right hepatic lobe measuring 10 mm compared to 7 mm on the prior study (radiologist commented this may represent an enlarged cyst). MRI of the abdomen 04/12/2013 confirmed multiple T2 hyperintense metastatic lesions throughout the right liver Initiation of FOLFIRI/Avastin with genotype based irinotecan dosing per the [facility] study 04/05/2013 2. Irregular bowel habits/rectal bleeding secondary to #1. 3. Mild elevation of the liver enzymes. Question secondary to hepatic steatosis. 4. Indeterminate 8 mm posterior right liver lesion on the staging CT 03/06/2012. 5. Mildly elevated CEA at 5.7 on 03/06/2012. 6. History of radiation erythema at the groin and perineum 7. 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 8. 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 No clinical evidence of thrombophlebitis or extravasation. 9. Delayed nausea following chemotherapy-Decadron prophylaxis was added with cycle 3 CAPOX-improved. 10. Ileostomy takedown 12/07/2012. 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 12. Port-A-Cath placement 12/31/204 13. Severe neutropenia secondary to chemotherapy 14. Nausea and vomiting following cycle 1 of FOLFIRI   Disposition:  Jacob [name] completed a first cycle of FOLFIRI/Avastin on 04/05/2013. He presents today for day 15 chemotherapy. He has severe neutropenia. Chemotherapy will be held  today. He will return for day 15 on 04/26/2013. The irinotecan will be dose reduced per protocol and he will receive Neulasta support. We discussed the potential toxicities associated with Neulasta including the chance for bone pain, rash, and splenic rupture. He will begin prophylactic ciprofloxacin today. Jacob [name] understands to contact us for a fever or signs of an infection.  He had significant nausea and vomiting following the first treatment with FOLFIRI. We will add Aloxi and Emend to the anti-emetic premedication regimen.  He  will take prophylactic Decadron for 2 days following chemotherapy.  Jacob [name] is scheduled for an office visit  04/26/2013 .  Betsy Coder, MD  04/19/2013  8:43 AM

## 2013-04-19 NOTE — Progress Notes (Signed)
04/19/2013 Patient in to clinic today for evaluation at Cycle 1, Day 15. Due to [place] of 0.2 (grade 4 neutropenia), patient will not receive treatment today. Doses to be modified after confirming with [facility] (email sent to [employer]). Patient reports grade 2 vomiting on Day 2 of cycle, as well as constipation and one episode of grade 1 diarrhea. Plans made to re-evaluate in one week and to resume treatment at that time, if criteria met per protocol. Cindy S. Brigitte Pulse BSN, RN, CCRP 04/19/2013 12:00 PM

## 2013-04-20 ENCOUNTER — Telehealth: Payer: Self-pay | Admitting: Oncology

## 2013-04-20 NOTE — Telephone Encounter (Signed)
Talked to pt and gave her appt for lab and MD for [facility] 2015

## 2013-04-22 ENCOUNTER — Other Ambulatory Visit: Payer: Self-pay | Admitting: Oncology

## 2013-04-26 ENCOUNTER — Ambulatory Visit (HOSPITAL_BASED_OUTPATIENT_CLINIC_OR_DEPARTMENT_OTHER): Payer: BC Managed Care – PPO

## 2013-04-26 ENCOUNTER — Telehealth: Payer: Self-pay | Admitting: Oncology

## 2013-04-26 ENCOUNTER — Other Ambulatory Visit: Payer: Self-pay | Admitting: *Deleted

## 2013-04-26 ENCOUNTER — Ambulatory Visit (HOSPITAL_BASED_OUTPATIENT_CLINIC_OR_DEPARTMENT_OTHER): Payer: BC Managed Care – PPO | Admitting: Oncology

## 2013-04-26 ENCOUNTER — Ambulatory Visit: Payer: BC Managed Care – PPO

## 2013-04-26 ENCOUNTER — Encounter: Payer: BC Managed Care – PPO | Admitting: *Deleted

## 2013-04-26 VITALS — BP 137/89 | HR 81 | Temp 97.0°F | Resp 18 | Ht 70.0 in | Wt 178.8 lb

## 2013-04-26 DIAGNOSIS — D702 Other drug-induced agranulocytosis: Secondary | ICD-10-CM

## 2013-04-26 DIAGNOSIS — R748 Abnormal levels of other serum enzymes: Secondary | ICD-10-CM

## 2013-04-26 DIAGNOSIS — C20 Malignant neoplasm of rectum: Secondary | ICD-10-CM

## 2013-04-26 DIAGNOSIS — G620 Drug-induced polyneuropathy: Secondary | ICD-10-CM

## 2013-04-26 LAB — CBC WITH DIFFERENTIAL/PLATELET
BASO%: 0.6 % (ref 0.0–2.0)
Basophils Absolute: 0 10*3/uL (ref 0.0–0.1)
EOS%: 2.4 % (ref 0.0–7.0)
Eosinophils Absolute: 0 10*3/uL (ref 0.0–0.5)
HEMATOCRIT: 41.7 % (ref 38.4–49.9)
HGB: 14.1 g/dL (ref 13.0–17.1)
LYMPH%: 34.2 % (ref 14.0–49.0)
MCH: 30 pg (ref 27.2–33.4)
MCHC: 33.9 g/dL (ref 32.0–36.0)
MCV: 88.4 fL (ref 79.3–98.0)
MONO#: 0.3 10*3/uL (ref 0.1–0.9)
MONO%: 18.1 % — AB (ref 0.0–14.0)
NEUT#: 0.7 10*3/uL — ABNORMAL LOW (ref 1.5–6.5)
NEUT%: 44.7 % (ref 39.0–75.0)
Platelets: 129 10*3/uL — ABNORMAL LOW (ref 140–400)
RBC: 4.72 10*6/uL (ref 4.20–5.82)
RDW: 14.5 % (ref 11.0–14.6)
WBC: 1.6 10*3/uL — ABNORMAL LOW (ref 4.0–10.3)
lymph#: 0.6 10*3/uL — ABNORMAL LOW (ref 0.9–3.3)

## 2013-04-26 LAB — COMPREHENSIVE METABOLIC PANEL (CC13)
ALK PHOS: 70 U/L (ref 40–150)
ALT: 66 U/L — AB (ref 0–55)
ANION GAP: 11 meq/L (ref 3–11)
AST: 38 U/L — ABNORMAL HIGH (ref 5–34)
Albumin: 4.1 g/dL (ref 3.5–5.0)
BILIRUBIN TOTAL: 0.39 mg/dL (ref 0.20–1.20)
BUN: 11 mg/dL (ref 7.0–26.0)
CO2: 26 meq/L (ref 22–29)
CREATININE: 1 mg/dL (ref 0.7–1.3)
Calcium: 9.1 mg/dL (ref 8.4–10.4)
Chloride: 107 mEq/L (ref 98–109)
Glucose: 88 mg/dl (ref 70–140)
Potassium: 3.2 mEq/L — ABNORMAL LOW (ref 3.5–5.1)
Sodium: 145 mEq/L (ref 136–145)
Total Protein: 6.6 g/dL (ref 6.4–8.3)

## 2013-04-26 LAB — MAGNESIUM (CC13): Magnesium: 2.6 mg/dl — ABNORMAL HIGH (ref 1.5–2.5)

## 2013-04-26 MED ORDER — POTASSIUM CHLORIDE CRYS ER 20 MEQ PO TBCR
20.0000 meq | EXTENDED_RELEASE_TABLET | Freq: Every day | ORAL | Status: DC
Start: 2013-04-26 — End: 2013-09-12

## 2013-04-26 NOTE — Telephone Encounter (Signed)
gv pt appt schedule for feb/march.

## 2013-04-26 NOTE — Progress Notes (Signed)
04/26/2013 Patient in to clinic today for re-evaluation prior to Day 15 treatment. Due to [place] less than 1.0, patient will not receive treatment today. Patient aware of plan to reduce doses by one dose level, when resumed, with the addition of Neulasta on Day 3 at pump d/c. Patient voiced understanding. Patient will return to clinic in one week to re-evaluate for proceeding with Day 15 treatment. Cindy S. Brigitte Pulse BSN, RN, [place] 04/26/2013 9:48 AM

## 2013-04-26 NOTE — Progress Notes (Signed)
[place]    OFFICE PROGRESS NOTE   INTERVAL HISTORY:   [name] returns for scheduled followup of metastatic rectal cancer. He is taking ciprofloxacin. He developed a "cold "last week. He reports a low-grade fever (100), sore throat, and cough. His family had similar symptoms.  Objective:  Vital signs in last 24 hours:  Blood pressure 137/89, pulse 81, temperature 97 F (36.1 C), temperature source Oral, resp. rate 18, height 5\' 10"  (1.778 m), weight 178 lb 12.8 oz (81.103 kg), SpO2 98.00%.    HEENT: Slight enlargement of the right compared to left tonsil, no thrush, exudates, or ulcers Resp: Lungs clear bilaterally Cardio: Regular rate and rhythm GI: No hepatomegaly, nontender Vascular: No leg edema   Portacath/PICC-without erythema  Lab Results:  Lab Results  Component Value Date   WBC 1.6* 04/26/2013   HGB 14.1 04/26/2013   HCT 41.7 04/26/2013   MCV 88.4 04/26/2013   PLT 129* 04/26/2013   NEUTROABS 0.7* 04/26/2013      Medications: I have reviewed the patient's current medications.  Assessment/Plan:  1. Rectal cancer. Partially obstructing mass noted 1-2 cm from the anal verge on a colonoscopy 03/06/2012. Endoscopic ultrasound 03/14/2012 with a 7.5 mm thick, 3.2 cm wide hypoechoic, irregularly bordered mass that clearly passed into and through the muscularis propria layer of the distal rectal wall (uT3); 3 small (largest 7 mm) perirectal lymph nodes. The lymph nodes were all round, discrete, hypoechoic, homogenous; suspicious for malignant involvement (uN1).  He began radiation and concurrent Xeloda chemotherapy on 03/20/2012, completed 04/27/2012.  Low anterior resection/coloanal anastomosis and diverting ileostomy 06/29/2012 with the final pathology revealing a T2N0 tumor with extensive fibrosis and negative margins.  Cycle 1 of adjuvant CAPOX 07/19/2012. Cycle 5 of adjuvant CAPOX 10/11/2012.  CEA 2.5 on 11/14/2012.  CEA 14.6 03/08/2013.  Restaging  CT evaluation 03/08/2013 with a 4 mm pulmonary nodule left lung base not identified on comparison exam; new liver lesions including a 29 x 26 mm irregular peripheral enhancing rounded lesion in the dome of the right hepatic lobe, a less well-defined new subcapsular lesion in the lateral right hepatic lobe measuring 12 mm, a new subcapsular lesion in the anterior right hepatic lobe adjacent to the gallbladder fossa measuring 10 mm; a rounded low-density lesion in the inferior right hepatic lobe measuring 10 mm compared to 7 mm on the prior study (radiologist commented this may represent an enlarged cyst).  MRI of the abdomen 04/12/2013 confirmed multiple T2 hyperintense metastatic lesions throughout the right liver  Initiation of FOLFIRI/Avastin with genotype based irinotecan dosing per the [facility] study 04/05/2013 2. Irregular bowel habits/rectal bleeding secondary to #1. 3. Mild elevation of the liver enzymes. Question secondary to hepatic steatosis. 4. Indeterminate 8 mm posterior right liver lesion on the staging CT 03/06/2012. 5. Mildly elevated CEA at 5.7 on 03/06/2012. 6. History of radiation erythema at the groin and perineum 7. 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 8. 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 No clinical evidence of thrombophlebitis or extravasation. 9. Delayed nausea following chemotherapy-Decadron prophylaxis was added with cycle 3 CAPOX-improved. 10. Ileostomy takedown 12/07/2012. 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 12. Port-A-Cath placement 12/31/204 13. Severe neutropenia secondary to chemotherapy following cycle 1 of FOLFIRI, improved-he will discontinue ciprofloxacin 14. Nausea and vomiting following cycle 1 of FOLFIRI-the antiemetic regimen will be adjusted for cycle 2     Disposition:  Mr. Haueter appears well. The neutropenia has  improved, but is not  adequate to resume chemotherapy. He will return for an office visit and day 15 chemotherapy in one week. He will discontinue prophylactic ciprofloxacin. I suspect he had a mild viral upper respiratory infection last week. The irinotecan and 5-FU will be dose reduced with day 15. He will receive Neulasta support.  Betsy Coder, MD  04/26/2013  3:36 PM

## 2013-04-29 ENCOUNTER — Other Ambulatory Visit: Payer: Self-pay | Admitting: Oncology

## 2013-05-02 ENCOUNTER — Other Ambulatory Visit: Payer: Self-pay | Admitting: *Deleted

## 2013-05-02 DIAGNOSIS — C20 Malignant neoplasm of rectum: Secondary | ICD-10-CM

## 2013-05-03 ENCOUNTER — Encounter: Payer: Self-pay | Admitting: *Deleted

## 2013-05-03 ENCOUNTER — Ambulatory Visit (HOSPITAL_BASED_OUTPATIENT_CLINIC_OR_DEPARTMENT_OTHER): Payer: BC Managed Care – PPO | Admitting: Nurse Practitioner

## 2013-05-03 ENCOUNTER — Encounter: Payer: BC Managed Care – PPO | Admitting: *Deleted

## 2013-05-03 ENCOUNTER — Encounter (HOSPITAL_COMMUNITY): Payer: Self-pay | Admitting: Emergency Medicine

## 2013-05-03 ENCOUNTER — Other Ambulatory Visit (HOSPITAL_BASED_OUTPATIENT_CLINIC_OR_DEPARTMENT_OTHER): Payer: BC Managed Care – PPO

## 2013-05-03 ENCOUNTER — Emergency Department (HOSPITAL_COMMUNITY)
Admission: EM | Admit: 2013-05-03 | Discharge: 2013-05-03 | Disposition: A | Discharge status: Home / Self Care | Payer: BC Managed Care – PPO | Attending: Emergency Medicine | Admitting: Emergency Medicine

## 2013-05-03 ENCOUNTER — Ambulatory Visit (HOSPITAL_BASED_OUTPATIENT_CLINIC_OR_DEPARTMENT_OTHER): Payer: BC Managed Care – PPO

## 2013-05-03 VITALS — BP 152/90 | HR 76 | Temp 97.3°F | Resp 18 | Ht 70.0 in | Wt 180.9 lb

## 2013-05-03 DIAGNOSIS — Z8719 Personal history of other diseases of the digestive system: Secondary | ICD-10-CM | POA: Insufficient documentation

## 2013-05-03 DIAGNOSIS — Z880 Allergy status to penicillin: Secondary | ICD-10-CM | POA: Insufficient documentation

## 2013-05-03 DIAGNOSIS — Z79899 Other long term (current) drug therapy: Secondary | ICD-10-CM | POA: Insufficient documentation

## 2013-05-03 DIAGNOSIS — Y832 Surgical operation with anastomosis, bypass or graft as the cause of abnormal reaction of the patient, or of later complication, without mention of misadventure at the time of the procedure: Secondary | ICD-10-CM | POA: Insufficient documentation

## 2013-05-03 DIAGNOSIS — Z8619 Personal history of other infectious and parasitic diseases: Secondary | ICD-10-CM | POA: Insufficient documentation

## 2013-05-03 DIAGNOSIS — Z9221 Personal history of antineoplastic chemotherapy: Secondary | ICD-10-CM | POA: Insufficient documentation

## 2013-05-03 DIAGNOSIS — Z8679 Personal history of other diseases of the circulatory system: Secondary | ICD-10-CM | POA: Insufficient documentation

## 2013-05-03 DIAGNOSIS — K625 Hemorrhage of anus and rectum: Secondary | ICD-10-CM

## 2013-05-03 DIAGNOSIS — Z85048 Personal history of other malignant neoplasm of rectum, rectosigmoid junction, and anus: Secondary | ICD-10-CM | POA: Insufficient documentation

## 2013-05-03 DIAGNOSIS — Z923 Personal history of irradiation: Secondary | ICD-10-CM | POA: Insufficient documentation

## 2013-05-03 DIAGNOSIS — IMO0002 Reserved for concepts with insufficient information to code with codable children: Secondary | ICD-10-CM | POA: Insufficient documentation

## 2013-05-03 DIAGNOSIS — C20 Malignant neoplasm of rectum: Secondary | ICD-10-CM

## 2013-05-03 DIAGNOSIS — G620 Drug-induced polyneuropathy: Secondary | ICD-10-CM

## 2013-05-03 DIAGNOSIS — F411 Generalized anxiety disorder: Secondary | ICD-10-CM | POA: Insufficient documentation

## 2013-05-03 DIAGNOSIS — T85618A Breakdown (mechanical) of other specified internal prosthetic devices, implants and grafts, initial encounter: Secondary | ICD-10-CM

## 2013-05-03 DIAGNOSIS — E663 Overweight: Secondary | ICD-10-CM | POA: Insufficient documentation

## 2013-05-03 DIAGNOSIS — Z8669 Personal history of other diseases of the nervous system and sense organs: Secondary | ICD-10-CM | POA: Insufficient documentation

## 2013-05-03 DIAGNOSIS — Z5111 Encounter for antineoplastic chemotherapy: Secondary | ICD-10-CM

## 2013-05-03 DIAGNOSIS — T82598A Other mechanical complication of other cardiac and vascular devices and implants, initial encounter: Secondary | ICD-10-CM | POA: Insufficient documentation

## 2013-05-03 DIAGNOSIS — Z872 Personal history of diseases of the skin and subcutaneous tissue: Secondary | ICD-10-CM | POA: Insufficient documentation

## 2013-05-03 DIAGNOSIS — D702 Other drug-induced agranulocytosis: Secondary | ICD-10-CM

## 2013-05-03 DIAGNOSIS — R030 Elevated blood-pressure reading, without diagnosis of hypertension: Secondary | ICD-10-CM

## 2013-05-03 DIAGNOSIS — T85698A Other mechanical complication of other specified internal prosthetic devices, implants and grafts, initial encounter: Secondary | ICD-10-CM

## 2013-05-03 DIAGNOSIS — Z862 Personal history of diseases of the blood and blood-forming organs and certain disorders involving the immune mechanism: Secondary | ICD-10-CM | POA: Insufficient documentation

## 2013-05-03 DIAGNOSIS — E876 Hypokalemia: Secondary | ICD-10-CM

## 2013-05-03 LAB — CBC WITH DIFFERENTIAL/PLATELET
BASO%: 0.6 % (ref 0.0–2.0)
Basophils Absolute: 0 10*3/uL (ref 0.0–0.1)
EOS%: 0.8 % (ref 0.0–7.0)
Eosinophils Absolute: 0 10*3/uL (ref 0.0–0.5)
HEMATOCRIT: 41.6 % (ref 38.4–49.9)
HGB: 13.9 g/dL (ref 13.0–17.1)
LYMPH%: 15.2 % (ref 14.0–49.0)
MCH: 29.8 pg (ref 27.2–33.4)
MCHC: 33.5 g/dL (ref 32.0–36.0)
MCV: 89 fL (ref 79.3–98.0)
MONO#: 0.5 10*3/uL (ref 0.1–0.9)
MONO%: 13.2 % (ref 0.0–14.0)
NEUT#: 2.6 10*3/uL (ref 1.5–6.5)
NEUT%: 70.2 % (ref 39.0–75.0)
PLATELETS: 130 10*3/uL — AB (ref 140–400)
RBC: 4.67 10*6/uL (ref 4.20–5.82)
RDW: 14.7 % — ABNORMAL HIGH (ref 11.0–14.6)
WBC: 3.7 10*3/uL — ABNORMAL LOW (ref 4.0–10.3)
lymph#: 0.6 10*3/uL — ABNORMAL LOW (ref 0.9–3.3)

## 2013-05-03 LAB — URINALYSIS, MICROSCOPIC - CHCC
BLOOD: NEGATIVE
Bilirubin (Urine): NEGATIVE
Glucose: NEGATIVE mg/dL
KETONES: NEGATIVE mg/dL
Leukocyte Esterase: NEGATIVE
Nitrite: NEGATIVE
RBC / HPF: NEGATIVE (ref 0–2)
SPECIFIC GRAVITY, URINE: 1.03 (ref 1.003–1.035)
UROBILINOGEN UR: 0.2 mg/dL (ref 0.2–1)
pH: 6 (ref 4.6–8.0)

## 2013-05-03 LAB — MAGNESIUM (CC13): MAGNESIUM: 2.3 mg/dL (ref 1.5–2.5)

## 2013-05-03 LAB — COMPREHENSIVE METABOLIC PANEL (CC13)
ALK PHOS: 70 U/L (ref 40–150)
ALT: 58 U/L — AB (ref 0–55)
ANION GAP: 11 meq/L (ref 3–11)
AST: 40 U/L — AB (ref 5–34)
Albumin: 4.1 g/dL (ref 3.5–5.0)
BILIRUBIN TOTAL: 0.61 mg/dL (ref 0.20–1.20)
BUN: 14.9 mg/dL (ref 7.0–26.0)
CO2: 24 meq/L (ref 22–29)
CREATININE: 0.9 mg/dL (ref 0.7–1.3)
Calcium: 9.4 mg/dL (ref 8.4–10.4)
Chloride: 107 mEq/L (ref 98–109)
Glucose: 120 mg/dl (ref 70–140)
Potassium: 3.6 mEq/L (ref 3.5–5.1)
SODIUM: 143 meq/L (ref 136–145)
Total Protein: 6.7 g/dL (ref 6.4–8.3)

## 2013-05-03 LAB — RESEARCH LABS

## 2013-05-03 MED ORDER — IRINOTECAN HCL CHEMO INJECTION 100 MG/5ML
260.0000 mg/m2 | Freq: Once | INTRAVENOUS | Status: AC
  Administered 2013-05-03: 520 mg via INTRAVENOUS
  Filled 2013-05-03: qty 26

## 2013-05-03 MED ORDER — DEXAMETHASONE SODIUM PHOSPHATE 20 MG/5ML IJ SOLN
INTRAMUSCULAR | Status: AC
  Filled 2013-05-03: qty 5

## 2013-05-03 MED ORDER — LEUCOVORIN CALCIUM INJECTION 350 MG
320.0000 mg/m2 | Freq: Once | INTRAVENOUS | Status: AC
  Administered 2013-05-03: 640 mg via INTRAVENOUS
  Filled 2013-05-03: qty 32

## 2013-05-03 MED ORDER — SODIUM CHLORIDE 0.9 % IV SOLN
5.0000 mg/kg | Freq: Once | INTRAVENOUS | Status: AC
  Administered 2013-05-03: 400 mg via INTRAVENOUS
  Filled 2013-05-03: qty 16

## 2013-05-03 MED ORDER — PALONOSETRON HCL INJECTION 0.25 MG/5ML
0.2500 mg | Freq: Once | INTRAVENOUS | Status: AC
  Administered 2013-05-03: 0.25 mg via INTRAVENOUS

## 2013-05-03 MED ORDER — SODIUM CHLORIDE 0.9 % IV SOLN
150.0000 mg | Freq: Once | INTRAVENOUS | Status: AC
  Administered 2013-05-03: 150 mg via INTRAVENOUS
  Filled 2013-05-03: qty 5

## 2013-05-03 MED ORDER — DEXAMETHASONE SODIUM PHOSPHATE 20 MG/5ML IJ SOLN
12.0000 mg | Freq: Once | INTRAMUSCULAR | Status: AC
  Administered 2013-05-03: 12 mg via INTRAVENOUS

## 2013-05-03 MED ORDER — ATROPINE SULFATE 1 MG/ML IJ SOLN
INTRAMUSCULAR | Status: AC
  Filled 2013-05-03: qty 1

## 2013-05-03 MED ORDER — SODIUM CHLORIDE 0.9 % IV SOLN
Freq: Once | INTRAVENOUS | Status: AC
  Administered 2013-05-03: 11:00:00 via INTRAVENOUS

## 2013-05-03 MED ORDER — ATROPINE SULFATE 1 MG/ML IJ SOLN
0.5000 mg | Freq: Once | INTRAMUSCULAR | Status: AC | PRN
  Administered 2013-05-03: 0.5 mg via INTRAVENOUS

## 2013-05-03 MED ORDER — FLUOROURACIL CHEMO INJECTION 5 GM/100ML
2000.0000 mg/m2 | INTRAVENOUS | Status: DC
  Administered 2013-05-03: 4000 mg via INTRAVENOUS
  Filled 2013-05-03: qty 80

## 2013-05-03 MED ORDER — FLUOROURACIL CHEMO INJECTION 2.5 GM/50ML
320.0000 mg/m2 | Freq: Once | INTRAVENOUS | Status: AC
  Administered 2013-05-03: 650 mg via INTRAVENOUS
  Filled 2013-05-03: qty 13

## 2013-05-03 MED ORDER — PALONOSETRON HCL INJECTION 0.25 MG/5ML
INTRAVENOUS | Status: AC
  Filled 2013-05-03: qty 5

## 2013-05-03 NOTE — ED Notes (Signed)
Pt reports that 1.5 hours ago his chemotherapy pump stopped working. The device is connected to his port. Pt reports that he was napping and the top of his port dressing came off and the device alarmed "high pressure." The patient checked for kinks, however could not locate a kink, however the device shut it self off after several minutes of the high pressure alarm. Pt denies pain at the port site. Pt is A/O x4, vitals are WDL, and pt is in NAD.

## 2013-05-03 NOTE — ED Provider Notes (Signed)
CSN: [id number]     Arrival date & time 05/03/13  1949 History   First MD Initiated Contact with Patient 05/03/13 2003     Chief Complaint  Patient presents with  . Chemotherapy     (Consider location/radiation/quality/duration/timing/severity/associated sxs/prior Treatment) HPI  43 year old male with history of rectal cancer who presents for evaluation of possible mechanical chemotherapy pump failure.  Pt report 2 hrs ago his chemotherapy pump stop working.  STs the device was connected to his port and he was napping at that time when he heard an alarm going off stating "High Pressure" and subsequently the machine stop working.  He did notice that his port dressing has pulled off a small amount but denies any pain at the port site.  Pt checks for Kinks but unable to locate kink.  Pt is on his second chemotherapy through this machine, and the chemo usually last for 46 hrs.  Pt otherwise denies any other acute complaint such as fever, cp, sob or rash.  Was diagnosed with rectal cancer in 2013.  Dr. Annitta Jersey is his cancer doctor.    Past Medical History  Diagnosis Date  . Chicken pox as a child    ?  . Hernia 6 months old  . Allergic state 01/19/2012  . Reflux 01/19/2012  . Elevated liver function tests 01/19/2012  . Overweight 01/19/2012  . Preventative health care 01/19/2012  . Sun-damaged skin 01/19/2012  . Migraine headache as a child  . Hx of migraines   . Radiation 03/20/12-04/27/12    Pelvis 50.4 gray Rectal cancer  . Anxiety   . Fatty liver   . 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 Cancer 03/06/12    rectal bx=Adenocarcinoma  PT HAD RADIATION , CHEMO SURGERY  . Numbness     TOES OF BOTH FEET.  . Pain     LEFT HEEL PAIN -SEVERE ESPECIALLY AFTER SITTING OR LYING DOWN - DIFFICULT TO GET OUT OF BED AND WALK IN THE MORNINGS BECAUSE OF HEEL PAIN  . Anemia 12/31/2012  . Dysrhythmia as child    HX OF IRREGULAR HB AT TIME OF TONSILLECTOMY - SURGERY WAS NOT DONE-CAN'T  REMEMBER ANY OTHER DETAILS- NEVER HAD THE SURGERY.  . 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 Rectal biopsy  03/06/12    distal mass1-2cm from anal verge=Adenocarcinoma  . Eus  03/14/2012    Procedure: LOWER ENDOSCOPIC ULTRASOUND (EUS);  Surgeon: Milus Banister, MD;  Location: [facility];  Service: Endoscopy;  Laterality: N/A;  . Hernia repair  21 months old    right inguinal repair  . Cyst excision      L EAR AREA  . Laparoscopic low anterior resection N/A 06/29/2012    Procedure: LAPAROSCOPIC LOW ANTERIOR RESECTION, mobilization splenic flexure,coloanal anastomosis,diverting ileostomy;  Surgeon: Leighton Ruff, MD;  Location: [facility];  Service: General;  Laterality: N/A;  . Ileostomy closure N/A 12/07/2012    Procedure: ILEOSTOMY TAKEDOWN;  Surgeon: Leighton Ruff, MD;  Location: [facility];  Service: General;  Laterality: N/A;  . Portacath placement Left 03/21/2013    Procedure: INSERTION PORT-A-CATH;  Surgeon: Leighton Ruff, MD;  Location: [facility];  Service: General;  Laterality: Left;   Family History  Problem Relation Age of Onset  . Adopted: Yes  . Cancer Father   . Heart disease Father   . Colon cancer Neg Hx    History  Substance Use Topics  . Smoking status: Never Smoker   . Smokeless tobacco: Never Used  . Alcohol Use: Yes     Comment: RARE     Review of Systems  Constitutional: Negative for fever.  Respiratory: Negative for shortness of breath.   Cardiovascular: Negative for chest pain.  Skin: Negative for rash.      Allergies  Penicillins  Home Medications   Current Outpatient Rx  Name  Route  Sig  Dispense  Refill  . ALPRAZolam (XANAX) 0.25 MG tablet   Oral   Take 1 tablet (0.25 mg total) by mouth 2 (two) times daily as needed for sleep or anxiety.   60 tablet   1   . B Complex-C (B-COMPLEX WITH VITAMIN C) tablet   Oral   Take 1 tablet by mouth daily.          Marland Kitchen dexamethasone (DECADRON) 4 MG tablet      Take #1 tablet twice daily on day 2 & day 3 of each chemo treatment   24 tablet   0   . docusate sodium (COLACE) 100 MG capsule   Oral   Take 100 mg by mouth daily. Occasionally bid         . lidocaine-prilocaine (EMLA) cream   Topical   Apply 1 application topically as needed. Apply to [facility] site 1-2 hours prior to stick and cover with plastic wrap   30 g   prn   . loperamide (IMODIUM) 2 MG capsule   Oral   Take 2-4 mg by mouth as needed for diarrhea or loose stools (per dosing for CPT-11).         . Multiple Vitamin (MULTIVITAMIN) tablet   Oral   Take 1 tablet by mouth daily. Men's one-a-day formulation.         . potassium chloride SA (K-DUR,KLOR-CON) 20 MEQ tablet   Oral   Take 1 tablet (20 mEq total) by mouth daily.   30 tablet   2   . Probiotic Product (PROBIOTIC DAILY PO)   Oral   Take 1 tablet by mouth daily.         . prochlorperazine (COMPAZINE) 10 MG tablet   Oral   Take 10 mg by mouth every 6 (six) hours as needed for nausea or vomiting.          BP 145/88  Pulse 67  Temp(Src) 97.8 F (36.6 C) (Oral)  Resp 18  SpO2 98% Physical Exam  Constitutional: He appears well-developed and well-nourished. No distress.  HENT:  Head: Atraumatic.  Eyes: Conjunctivae are normal.  Neck: Normal range of motion. Neck supple.  Pulmonary/Chest:  Chemo port to L upper chest, appears intact, nontender to palpation, clear dressing in place.    Neurological: He is alert.  Skin: No rash noted.  Psychiatric: He has a normal mood and affect.    ED Course  Procedures (including critical care time)  8:26 PM Pt currently using [facility] Plus chemo machine which he has with him.  Will check the machine.  No other complaint.    8:37 PM Pt called the help line number and was able to receive appropriate instruction to fix his machine.  It is now functional, pt left prior to receiving discharge summary.  Care  discussed with Dr. Rogene Houston.  Labs Review Labs Reviewed - No data to display Imaging Review No results found.  EKG Interpretation   None       MDM   Final diagnoses:  Malfunction of device    BP 145/88  Pulse 67  Temp(Src) 97.8 F (36.6 C) (Oral)  Resp 18  SpO2 98%     Domenic Moras, PA-C 05/03/13 2038

## 2013-05-03 NOTE — Progress Notes (Signed)
OFFICE PROGRESS NOTE  Interval history:  Jacob [name] returns for followup of metastatic rectal cancer. He is feeling better from the recent "cold". He reports an overall good appetite. No nausea or vomiting. No mouth sores. No diarrhea. He denies any bleeding. No abdominal pain. No leg swelling or calf pain. He denies shortness of breath and chest pain.   Objective: Filed Vitals:   05/03/13 0955  BP: 152/90  Pulse: 76  Temp: 97.3 F (36.3 C)  Resp: 18   Oropharynx is without thrush or ulceration. Lungs are clear. No wheezes or rales. Regular cardiac rhythm. Port-A-Cath site without erythema. Abdomen soft and nontender. No hepatomegaly. No leg edema.   Lab Results: Lab Results  Component Value Date   WBC 3.7* 05/03/2013   HGB 13.9 05/03/2013   HCT 41.6 05/03/2013   MCV 89.0 05/03/2013   PLT 130* 05/03/2013   NEUTROABS 2.6 05/03/2013    Chemistry:    Chemistry      Component Value Date/Time   NA 143 05/03/2013 0934   NA 144 12/28/2012 1542   K 3.6 05/03/2013 0934   K 3.5 12/28/2012 1542   CL 106 12/28/2012 1542   CL 108* 08/30/2012 0903   CO2 24 05/03/2013 0934   CO2 27 12/28/2012 1542   BUN 14.9 05/03/2013 0934   BUN 13 12/28/2012 1542   CREATININE 0.9 05/03/2013 0934   CREATININE 0.91 12/28/2012 1542   CREATININE 0.86 12/10/2012 0508      Component Value Date/Time   CALCIUM 9.4 05/03/2013 0934   CALCIUM 9.4 12/28/2012 1542   ALKPHOS 70 05/03/2013 0934   ALKPHOS 63 11/29/2012 1050   AST 40* 05/03/2013 0934   AST 53* 11/29/2012 1050   ALT 58* 05/03/2013 0934   ALT 87* 11/29/2012 1050   BILITOT 0.61 05/03/2013 0934   BILITOT 0.2* 11/29/2012 1050       Studies/Results: Mr Liver W Wo Contrast  04/12/2013   CLINICAL DATA:  Evaluate liver lesions.  History of rectal cancer.  EXAM: MRI ABDOMEN WITHOUT AND WITH CONTRAST  TECHNIQUE: Multiplanar, multisequence MR imaging was performed both before and after administration of intravenous contrast.  CONTRAST:  61mL MULTIHANCE GADOBENATE  DIMEGLUMINE 529 MG/ML IV SOLN  COMPARISON:  CT abdomen 03/08/2013  FINDINGS: No pleural effusions are identified. There is no pericardial effusion.  Multifocal T2 hyperintense lesions are identified throughout the right hepatic lobe. Index lesion along the dome of liver measures 2.8 cm, image 7/ series 4. Previously 2.9 cm. Lateral right hepatic lobe lesion measures 1.7 cm, image 12/series 4. This is compared with 1.2 cm previously. Anterior right hepatic lobe lesion measures 1.7 cm, image 19/ series 4. Previously 1.1 cm. There is a 1 cm cyst within the posterior right hepatic lobe, image 19/series 4. The gallbladder appears normal. No biliary dilatation. Normal appearance of the pancreas. The spleen appears normal.  The adrenal glands are both within normal limits. Normal appearance of both kidneys. No upper abdominal adenopathy. There is no ascites identified.  No suspicious bone lesions identified.  IMPRESSION: 1. Multi focal liver metastasis are slightly increased in size from previous exam.   Electronically Signed   By: Kerby Moors M.D.   On: 04/12/2013 08:34    Medications: I have reviewed the patient's current medications.  Assessment/Plan: 1. Rectal cancer. Partially obstructing mass noted 1-2 cm from the anal verge on a colonoscopy 03/06/2012. Endoscopic ultrasound 03/14/2012 with a 7.5 mm thick, 3.2 cm wide hypoechoic, irregularly bordered mass that clearly passed into  and through the muscularis propria layer of the distal rectal wall (uT3); 3 small (largest 7 mm) perirectal lymph nodes. The lymph nodes were all round, discrete, hypoechoic, homogenous; suspicious for malignant involvement (uN1).  He began radiation and concurrent Xeloda chemotherapy on 03/20/2012, completed 04/27/2012.  Low anterior resection/coloanal anastomosis and diverting ileostomy 06/29/2012 with the final pathology revealing a T2N0 tumor with extensive fibrosis and negative margins.  Cycle 1 of adjuvant CAPOX 07/19/2012.  Cycle 5 of adjuvant CAPOX 10/11/2012.  CEA 2.5 on 11/14/2012.  CEA 14.6 03/08/2013.  Restaging CT evaluation 03/08/2013 with a 4 mm pulmonary nodule left lung base not identified on comparison exam; new liver lesions including a 29 x 26 mm irregular peripheral enhancing rounded lesion in the dome of the right hepatic lobe, a less well-defined new subcapsular lesion in the lateral right hepatic lobe measuring 12 mm, a new subcapsular lesion in the anterior right hepatic lobe adjacent to the gallbladder fossa measuring 10 mm; a rounded low-density lesion in the inferior right hepatic lobe measuring 10 mm compared to 7 mm on the prior study (radiologist commented this may represent an enlarged cyst).  MRI of the abdomen 04/12/2013 confirmed multiple T2 hyperintense metastatic lesions throughout the right liver  Initiation of FOLFIRI/Avastin with genotype based irinotecan dosing per the [facility] study 04/05/2013 2. Irregular bowel habits/rectal bleeding secondary to #1. 3. Mild elevation of the liver enzymes. Question secondary to hepatic steatosis. 4. Indeterminate 8 mm posterior right liver lesion on the staging CT 03/06/2012. 5. Mildly elevated CEA at 5.7 on 03/06/2012. 6. History of radiation erythema at the groin and perineum 7. 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 8. 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 No clinical evidence of thrombophlebitis or extravasation. 9. Delayed nausea following chemotherapy-Decadron prophylaxis was added with cycle 3 CAPOX-improved. 10. Ileostomy takedown 12/07/2012. 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 12. Port-A-Cath placement 12/31/204 13. Severe neutropenia secondary to chemotherapy following cycle 1 of FOLFIRI, improved. He will receive Neulasta on the day of pump discontinuation beginning with cycle 2. 14. Nausea and vomiting following cycle 1 of  FOLFIRI-the antiemetic regimen will be adjusted for cycle 2 15. Hypokalemia. Improved. He will continue K-Dur 20 milliequivalents daily. 16. Mildly elevated blood pressure. If the blood pressure remained elevated on a consistent basis we will initiate medical therapy.   Dispositon-he appears stable. Plan to proceed with cycle 1 day 15 FOLFIRI/Avastin today as scheduled. He will receive Neulasta on the day of pump discontinuation. He will return for a followup visit and cycle 2 day 1 FOLFIRI/Avastin in 2 weeks.  Plan reviewed with Dr. Benay Spice.   Ned Card ANP/GNP-BC

## 2013-05-03 NOTE — Progress Notes (Signed)
05/03/2013 Patient in to clinic today for re-evaluation following treatment delay x 2 weeks. Lab results reviewed by Ned Card NP, and confirmed to be within range for re-treatment today, at reduced dose per protocol. Based on history and physical exam by Ned Card, and confirmed with Dr. Benay Spice, patient condition is acceptable for treatment. Sign for infusion given to Randolm Idol RN, and patient status reviewed. See separate Consent Form encounter for discussion regarding the NSABP MPR-1 study. Cindy S. Brigitte Pulse BSN, RN, CCRP 05/03/2013 11:13 AM

## 2013-05-03 NOTE — ED Notes (Signed)
Pt called support number on side of chemo pump and was able to fix pump while in ED. Left after fixing pump.

## 2013-05-04 ENCOUNTER — Telehealth: Payer: Self-pay | Admitting: *Deleted

## 2013-05-04 NOTE — Telephone Encounter (Signed)
Called to check on pt after pump issue on 2/12. He reports pump is working now. Asks if he should come in later on 2/14? Keep appointed time. He voiced understanding.

## 2013-05-05 ENCOUNTER — Ambulatory Visit (HOSPITAL_BASED_OUTPATIENT_CLINIC_OR_DEPARTMENT_OTHER): Payer: BC Managed Care – PPO

## 2013-05-05 VITALS — BP 145/82 | HR 90 | Temp 97.4°F | Resp 19

## 2013-05-05 DIAGNOSIS — C20 Malignant neoplasm of rectum: Secondary | ICD-10-CM

## 2013-05-05 DIAGNOSIS — D702 Other drug-induced agranulocytosis: Secondary | ICD-10-CM

## 2013-05-05 MED ORDER — PEGFILGRASTIM INJECTION 6 MG/0.6ML
6.0000 mg | Freq: Once | SUBCUTANEOUS | Status: AC
  Administered 2013-05-05: 6 mg via SUBCUTANEOUS

## 2013-05-05 MED ORDER — HEPARIN SOD (PORK) LOCK FLUSH 100 UNIT/ML IV SOLN
500.0000 [IU] | Freq: Once | INTRAVENOUS | Status: AC | PRN
  Administered 2013-05-05: 500 [IU]
  Filled 2013-05-05: qty 5

## 2013-05-05 MED ORDER — SODIUM CHLORIDE 0.9 % IJ SOLN
10.0000 mL | INTRAMUSCULAR | Status: DC | PRN
  Administered 2013-05-05: 10 mL
  Filled 2013-05-05: qty 10

## 2013-05-05 NOTE — Patient Instructions (Signed)

## 2013-05-05 NOTE — ED Provider Notes (Signed)
Medical screening examination/treatment/procedure(s) were performed by non-physician practitioner and as supervising physician I was immediately available for consultation/collaboration.  EKG Interpretation   None         Mervin Kung, MD 05/05/13 1640

## 2013-05-08 ENCOUNTER — Encounter (INDEPENDENT_AMBULATORY_CARE_PROVIDER_SITE_OTHER): Payer: BC Managed Care – PPO | Admitting: General Surgery

## 2013-05-13 ENCOUNTER — Other Ambulatory Visit: Payer: Self-pay | Admitting: Oncology

## 2013-05-17 ENCOUNTER — Encounter: Payer: Self-pay | Admitting: *Deleted

## 2013-05-17 ENCOUNTER — Ambulatory Visit: Payer: BC Managed Care – PPO | Admitting: Oncology

## 2013-05-17 ENCOUNTER — Ambulatory Visit: Payer: BC Managed Care – PPO

## 2013-05-17 ENCOUNTER — Telehealth: Payer: Self-pay | Admitting: Oncology

## 2013-05-17 ENCOUNTER — Other Ambulatory Visit: Payer: BC Managed Care – PPO

## 2013-05-17 ENCOUNTER — Other Ambulatory Visit: Payer: Self-pay | Admitting: *Deleted

## 2013-05-17 NOTE — Progress Notes (Signed)
05/17/13 [name] was scheduled for lab ,physical exam, and treatment today on the [place] clinical trial. He is not be able to come to the cancer center today because of the weather conditions today in [place]. Communicated the information to lab, infusion, and Dr. Gearldine Shown nurse who will follow up with Dr. Benay Spice for rescheduling.  He does request to have his treatments on Thursday if possible. If unable to schedule for next Thursday, he is agreeable to come on another weekday next week.

## 2013-05-17 NOTE — Telephone Encounter (Signed)
Gave pt appt for lab ,md and chemo for 3/5 r/s due to weather

## 2013-05-24 ENCOUNTER — Telehealth: Payer: Self-pay | Admitting: Oncology

## 2013-05-24 ENCOUNTER — Ambulatory Visit (HOSPITAL_BASED_OUTPATIENT_CLINIC_OR_DEPARTMENT_OTHER): Payer: BC Managed Care – PPO | Admitting: Oncology

## 2013-05-24 ENCOUNTER — Ambulatory Visit (HOSPITAL_BASED_OUTPATIENT_CLINIC_OR_DEPARTMENT_OTHER): Payer: BC Managed Care – PPO

## 2013-05-24 ENCOUNTER — Other Ambulatory Visit (HOSPITAL_BASED_OUTPATIENT_CLINIC_OR_DEPARTMENT_OTHER): Payer: BC Managed Care – PPO

## 2013-05-24 ENCOUNTER — Encounter: Payer: BC Managed Care – PPO | Admitting: *Deleted

## 2013-05-24 VITALS — BP 125/78 | HR 68 | Temp 97.3°F | Resp 18 | Ht 70.0 in | Wt 180.9 lb

## 2013-05-24 DIAGNOSIS — C20 Malignant neoplasm of rectum: Secondary | ICD-10-CM

## 2013-05-24 DIAGNOSIS — Z5112 Encounter for antineoplastic immunotherapy: Secondary | ICD-10-CM

## 2013-05-24 DIAGNOSIS — G620 Drug-induced polyneuropathy: Secondary | ICD-10-CM

## 2013-05-24 DIAGNOSIS — Z5111 Encounter for antineoplastic chemotherapy: Secondary | ICD-10-CM

## 2013-05-24 DIAGNOSIS — D702 Other drug-induced agranulocytosis: Secondary | ICD-10-CM

## 2013-05-24 DIAGNOSIS — E876 Hypokalemia: Secondary | ICD-10-CM

## 2013-05-24 LAB — CBC WITH DIFFERENTIAL/PLATELET
BASO%: 0.4 % (ref 0.0–2.0)
BASOS ABS: 0 10*3/uL (ref 0.0–0.1)
EOS%: 1.4 % (ref 0.0–7.0)
Eosinophils Absolute: 0 10*3/uL (ref 0.0–0.5)
HEMATOCRIT: 38.2 % — AB (ref 38.4–49.9)
HEMOGLOBIN: 12.7 g/dL — AB (ref 13.0–17.1)
LYMPH%: 14.1 % (ref 14.0–49.0)
MCH: 30.1 pg (ref 27.2–33.4)
MCHC: 33.3 g/dL (ref 32.0–36.0)
MCV: 90.4 fL (ref 79.3–98.0)
MONO#: 0.4 10*3/uL (ref 0.1–0.9)
MONO%: 10.8 % (ref 0.0–14.0)
NEUT#: 2.4 10*3/uL (ref 1.5–6.5)
NEUT%: 73.3 % (ref 39.0–75.0)
PLATELETS: 145 10*3/uL (ref 140–400)
RBC: 4.22 10*6/uL (ref 4.20–5.82)
RDW: 15.6 % — ABNORMAL HIGH (ref 11.0–14.6)
WBC: 3.3 10*3/uL — AB (ref 4.0–10.3)
lymph#: 0.5 10*3/uL — ABNORMAL LOW (ref 0.9–3.3)

## 2013-05-24 LAB — COMPREHENSIVE METABOLIC PANEL (CC13)
ALK PHOS: 56 U/L (ref 40–150)
ALT: 56 U/L — ABNORMAL HIGH (ref 0–55)
ANION GAP: 8 meq/L (ref 3–11)
AST: 32 U/L (ref 5–34)
Albumin: 3.6 g/dL (ref 3.5–5.0)
BILIRUBIN TOTAL: 0.3 mg/dL (ref 0.20–1.20)
BUN: 17.8 mg/dL (ref 7.0–26.0)
CO2: 26 mEq/L (ref 22–29)
Calcium: 9.5 mg/dL (ref 8.4–10.4)
Chloride: 112 mEq/L — ABNORMAL HIGH (ref 98–109)
Creatinine: 1 mg/dL (ref 0.7–1.3)
GLUCOSE: 101 mg/dL (ref 70–140)
Potassium: 3.7 mEq/L (ref 3.5–5.1)
Sodium: 145 mEq/L (ref 136–145)
Total Protein: 6.3 g/dL — ABNORMAL LOW (ref 6.4–8.3)

## 2013-05-24 LAB — MAGNESIUM (CC13): MAGNESIUM: 2.3 mg/dL (ref 1.5–2.5)

## 2013-05-24 MED ORDER — PALONOSETRON HCL INJECTION 0.25 MG/5ML
0.2500 mg | Freq: Once | INTRAVENOUS | Status: AC
  Administered 2013-05-24: 0.25 mg via INTRAVENOUS

## 2013-05-24 MED ORDER — SODIUM CHLORIDE 0.9 % IV SOLN
Freq: Once | INTRAVENOUS | Status: AC
  Administered 2013-05-24: 12:00:00 via INTRAVENOUS

## 2013-05-24 MED ORDER — PALONOSETRON HCL INJECTION 0.25 MG/5ML
INTRAVENOUS | Status: AC
  Filled 2013-05-24: qty 5

## 2013-05-24 MED ORDER — LEUCOVORIN CALCIUM INJECTION 350 MG
320.0000 mg/m2 | Freq: Once | INTRAVENOUS | Status: AC
  Administered 2013-05-24: 640 mg via INTRAVENOUS
  Filled 2013-05-24: qty 32

## 2013-05-24 MED ORDER — DEXAMETHASONE SODIUM PHOSPHATE 20 MG/5ML IJ SOLN
INTRAMUSCULAR | Status: AC
  Filled 2013-05-24: qty 5

## 2013-05-24 MED ORDER — IRINOTECAN HCL CHEMO INJECTION 100 MG/5ML
260.0000 mg/m2 | Freq: Once | INTRAVENOUS | Status: AC
  Administered 2013-05-24: 520 mg via INTRAVENOUS
  Filled 2013-05-24: qty 26

## 2013-05-24 MED ORDER — SODIUM CHLORIDE 0.9 % IV SOLN
2000.0000 mg/m2 | INTRAVENOUS | Status: DC
  Administered 2013-05-24: 4000 mg via INTRAVENOUS
  Filled 2013-05-24: qty 80

## 2013-05-24 MED ORDER — FLUOROURACIL CHEMO INJECTION 2.5 GM/50ML
320.0000 mg/m2 | Freq: Once | INTRAVENOUS | Status: AC
  Administered 2013-05-24: 650 mg via INTRAVENOUS
  Filled 2013-05-24: qty 13

## 2013-05-24 MED ORDER — DEXAMETHASONE SODIUM PHOSPHATE 20 MG/5ML IJ SOLN
12.0000 mg | Freq: Once | INTRAMUSCULAR | Status: AC
  Administered 2013-05-24: 12 mg via INTRAVENOUS

## 2013-05-24 MED ORDER — SODIUM CHLORIDE 0.9 % IV SOLN
150.0000 mg | Freq: Once | INTRAVENOUS | Status: AC
  Administered 2013-05-24: 150 mg via INTRAVENOUS
  Filled 2013-05-24: qty 5

## 2013-05-24 MED ORDER — SODIUM CHLORIDE 0.9 % IV SOLN
5.0000 mg/kg | Freq: Once | INTRAVENOUS | Status: AC
  Administered 2013-05-24: 400 mg via INTRAVENOUS
  Filled 2013-05-24: qty 16

## 2013-05-24 MED ORDER — ATROPINE SULFATE 1 MG/ML IJ SOLN: 0.5000 mg | Freq: Once | INTRAMUSCULAR | Status: DC | PRN

## 2013-05-24 MED ORDER — ATROPINE SULFATE 1 MG/ML IJ SOLN
INTRAMUSCULAR | Status: AC
Start: 2013-05-24 — End: 2013-05-24
  Filled 2013-05-24: qty 1

## 2013-05-24 NOTE — Progress Notes (Signed)
Patient in to clinic today for evaluation prior to receiving treatment Cycle 2. Note that Cycle 2 start date was delayed x 1 week due to inclement weather on 2/26, preventing patient from coming to the [facility]. Based on lab results review and history and physical by Dr. Benay Spice, patient condition is acceptable for continued treatment. Sign for infusion given to Corinda Gubler RN. Cindy S. Brigitte Pulse BSN, RN, CCRP 05/24/2013 1:00 PM

## 2013-05-24 NOTE — Telephone Encounter (Signed)
gv and printed appt sched and avs for pt for March adn April... sed added tx...Marland Kitchenper Johnell Comings cx 3.12.15 appts

## 2013-05-24 NOTE — Patient Instructions (Signed)
[facility] Discharge Instructions for Patients Receiving Chemotherapy  Today you received the following chemotherapy agents: Avastin, Irinotecan, Leucovorin, 5FU.   To help prevent nausea and vomiting after your treatment, we encourage you to take your nausea medication as prescribed.   If you develop nausea and vomiting that is not controlled by your nausea medication, call the clinic.   BELOW ARE SYMPTOMS THAT SHOULD BE REPORTED IMMEDIATELY:  *FEVER GREATER THAN 100.5 F  *CHILLS WITH OR WITHOUT FEVER  NAUSEA AND VOMITING THAT IS NOT CONTROLLED WITH YOUR NAUSEA MEDICATION  *UNUSUAL SHORTNESS OF BREATH  *UNUSUAL BRUISING OR BLEEDING  TENDERNESS IN MOUTH AND THROAT WITH OR WITHOUT PRESENCE OF ULCERS  *URINARY PROBLEMS  *BOWEL PROBLEMS  UNUSUAL RASH Items with * indicate a potential emergency and should be followed up as soon as possible.  Feel free to call the clinic you have any questions or concerns. The clinic phone number is [phone].

## 2013-05-24 NOTE — Progress Notes (Signed)
[place]    OFFICE PROGRESS NOTE   INTERVAL HISTORY:   [name] returns for scheduled followup of metastatic rectal cancer. He completed a second treatment with FOLFIRI/Avastin on 05/03/2013. He received Neulasta following chemotherapy. Prior to the Neulasta he developed severe pain near the right ischium. This spontaneously resolved over a few days. No trauma. No symptoms of venous or arterial thrombosis. No bleeding. He reports constipation.  He complains of "burning" at the perineum after certain bowel movements. This does not occur consistently. He continues to have neuropathy symptoms in the feet. He is working. He reports malaise. He missed day 1 cycle 2 chemotherapy secondary to the weather.  Objective:  Vital signs in last 24 hours:  Blood pressure 125/78, pulse 68, temperature 97.3 F (36.3 C), temperature source Oral, resp. rate 18, height 5\' 10"  (1.778 m), weight 180 lb 14.4 oz (82.056 kg), SpO2 10.00%.    HEENT: No thrush or ulcers Resp: Lungs clear bilaterally Cardio: Regular rate and rhythm GI: No hepatomegaly, nontender Vascular: No leg edema  Skin: Mild hyperpigmentation of the palms, no erythema   Portacath/PICC-without erythema  Lab Results:  Lab Results  Component Value Date   WBC 3.3* 05/24/2013   HGB 12.7* 05/24/2013   HCT 38.2* 05/24/2013   MCV 90.4 05/24/2013   PLT 145 05/24/2013   NEUTROABS 2.4 05/24/2013      Medications: I have reviewed the patient's current medications.  Assessment/Plan: 1. Rectal cancer. Partially obstructing mass noted 1-2 cm from the anal verge on a colonoscopy 03/06/2012. Endoscopic ultrasound 03/14/2012 with a 7.5 mm thick, 3.2 cm wide hypoechoic, irregularly bordered mass that clearly passed into and through the muscularis propria layer of the distal rectal wall (uT3); 3 small (largest 7 mm) perirectal lymph nodes. The lymph nodes were all round, discrete, hypoechoic, homogenous; suspicious for malignant  involvement (uN1).  He began radiation and concurrent Xeloda chemotherapy on 03/20/2012, completed 04/27/2012.  Low anterior resection/coloanal anastomosis and diverting ileostomy 06/29/2012 with the final pathology revealing a T2N0 tumor with extensive fibrosis and negative margins.  Cycle 1 of adjuvant CAPOX 07/19/2012. Cycle 5 of adjuvant CAPOX 10/11/2012.  CEA 2.5 on 11/14/2012.  CEA 14.6 03/08/2013.  Restaging CT evaluation 03/08/2013 with a 4 mm pulmonary nodule left lung base not identified on comparison exam; new liver lesions including a 29 x 26 mm irregular peripheral enhancing rounded lesion in the dome of the right hepatic lobe, a less well-defined new subcapsular lesion in the lateral right hepatic lobe measuring 12 mm, a new subcapsular lesion in the anterior right hepatic lobe adjacent to the gallbladder fossa measuring 10 mm; a rounded low-density lesion in the inferior right hepatic lobe measuring 10 mm compared to 7 mm on the prior study (radiologist commented this may represent an enlarged cyst).  MRI of the abdomen 04/12/2013 confirmed multiple T2 hyperintense metastatic lesions throughout the right liver  Initiation of FOLFIRI/Avastin with genotype based irinotecan dosing per the [facility] study 04/05/2013 2. Irregular bowel habits/rectal bleeding secondary to #1. 3. Mild elevation of the liver enzymes. Question secondary to hepatic steatosis. 4. Indeterminate 8 mm posterior right liver lesion on the staging CT 03/06/2012. 5. Mildly elevated CEA at 5.7 on 03/06/2012. 6. History of radiation erythema at the groin and perineum 7. 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 8. 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 No clinical evidence of thrombophlebitis or extravasation. 9. Delayed nausea following chemotherapy-Decadron  prophylaxis was added with cycle 3  CAPOX-improved. 10. Ileostomy takedown 12/07/2012. 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 12. Port-A-Cath placement 12/31/204 13. Severe neutropenia secondary to chemotherapy following cycle 1 of FOLFIRI, chemotherapy was dose reduced and he received Neulasta with day 15 cycle 1  14. Nausea and vomiting following cycle 1 of FOLFIRI-the antiemetic regimen was adjusted with day 15 cycle 1 15. History of Hypokalemia. Improved. He will continue K-Dur 20 milliequivalents daily.   Disposition:  Mr. Weida appears stable. The plan is to proceed with day 1 cycle 2 FOLFIRI/Avastin today. He will again receive Neulasta support. He will return for an office visit and chemotherapy in 2 weeks.  His ECoG performance status is measured at 1 today.   Betsy Coder, MD  05/24/2013  11:32 AM

## 2013-05-26 ENCOUNTER — Ambulatory Visit (HOSPITAL_BASED_OUTPATIENT_CLINIC_OR_DEPARTMENT_OTHER): Payer: BC Managed Care – PPO

## 2013-05-26 VITALS — BP 149/81 | HR 81 | Temp 97.8°F | Resp 18

## 2013-05-26 DIAGNOSIS — C20 Malignant neoplasm of rectum: Secondary | ICD-10-CM

## 2013-05-26 DIAGNOSIS — Z5189 Encounter for other specified aftercare: Secondary | ICD-10-CM

## 2013-05-26 MED ORDER — SODIUM CHLORIDE 0.9 % IJ SOLN
10.0000 mL | INTRAMUSCULAR | Status: DC | PRN
  Administered 2013-05-26: 10 mL
  Filled 2013-05-26: qty 10

## 2013-05-26 MED ORDER — HEPARIN SOD (PORK) LOCK FLUSH 100 UNIT/ML IV SOLN
500.0000 [IU] | Freq: Once | INTRAVENOUS | Status: AC | PRN
  Administered 2013-05-26: 500 [IU]
  Filled 2013-05-26: qty 5

## 2013-05-26 MED ORDER — PEGFILGRASTIM INJECTION 6 MG/0.6ML
6.0000 mg | Freq: Once | SUBCUTANEOUS | Status: AC
  Administered 2013-05-26: 6 mg via SUBCUTANEOUS

## 2013-05-31 ENCOUNTER — Other Ambulatory Visit: Payer: BC Managed Care – PPO

## 2013-05-31 ENCOUNTER — Ambulatory Visit: Payer: BC Managed Care – PPO | Admitting: Nurse Practitioner

## 2013-05-31 ENCOUNTER — Ambulatory Visit: Payer: BC Managed Care – PPO

## 2013-06-03 ENCOUNTER — Other Ambulatory Visit: Payer: Self-pay | Admitting: Oncology

## 2013-06-07 ENCOUNTER — Other Ambulatory Visit: Payer: BC Managed Care – PPO

## 2013-06-07 ENCOUNTER — Telehealth: Payer: Self-pay | Admitting: Oncology

## 2013-06-07 ENCOUNTER — Other Ambulatory Visit (HOSPITAL_BASED_OUTPATIENT_CLINIC_OR_DEPARTMENT_OTHER): Payer: BC Managed Care – PPO

## 2013-06-07 ENCOUNTER — Ambulatory Visit (HOSPITAL_BASED_OUTPATIENT_CLINIC_OR_DEPARTMENT_OTHER): Payer: BC Managed Care – PPO | Admitting: Oncology

## 2013-06-07 ENCOUNTER — Encounter: Payer: BC Managed Care – PPO | Admitting: *Deleted

## 2013-06-07 ENCOUNTER — Ambulatory Visit (HOSPITAL_BASED_OUTPATIENT_CLINIC_OR_DEPARTMENT_OTHER): Payer: BC Managed Care – PPO

## 2013-06-07 ENCOUNTER — Encounter (INDEPENDENT_AMBULATORY_CARE_PROVIDER_SITE_OTHER): Payer: BC Managed Care – PPO | Admitting: General Surgery

## 2013-06-07 VITALS — BP 140/80 | HR 72

## 2013-06-07 VITALS — BP 143/74 | HR 71 | Temp 97.5°F | Resp 18 | Ht 70.0 in | Wt 175.3 lb

## 2013-06-07 DIAGNOSIS — Z5112 Encounter for antineoplastic immunotherapy: Secondary | ICD-10-CM

## 2013-06-07 DIAGNOSIS — R112 Nausea with vomiting, unspecified: Secondary | ICD-10-CM

## 2013-06-07 DIAGNOSIS — K6289 Other specified diseases of anus and rectum: Secondary | ICD-10-CM

## 2013-06-07 DIAGNOSIS — Z5111 Encounter for antineoplastic chemotherapy: Secondary | ICD-10-CM

## 2013-06-07 DIAGNOSIS — C787 Secondary malignant neoplasm of liver and intrahepatic bile duct: Secondary | ICD-10-CM

## 2013-06-07 DIAGNOSIS — C20 Malignant neoplasm of rectum: Secondary | ICD-10-CM

## 2013-06-07 DIAGNOSIS — D702 Other drug-induced agranulocytosis: Secondary | ICD-10-CM

## 2013-06-07 DIAGNOSIS — K625 Hemorrhage of anus and rectum: Secondary | ICD-10-CM

## 2013-06-07 LAB — UA PROTEIN, DIPSTICK - CHCC: PROTEIN: NEGATIVE mg/dL

## 2013-06-07 LAB — COMPREHENSIVE METABOLIC PANEL (CC13)
ALT: 50 U/L (ref 0–55)
AST: 31 U/L (ref 5–34)
Albumin: 3.6 g/dL (ref 3.5–5.0)
Alkaline Phosphatase: 81 U/L (ref 40–150)
Anion Gap: 10 mEq/L (ref 3–11)
BILIRUBIN TOTAL: 0.32 mg/dL (ref 0.20–1.20)
BUN: 12.4 mg/dL (ref 7.0–26.0)
CHLORIDE: 109 meq/L (ref 98–109)
CO2: 26 mEq/L (ref 22–29)
CREATININE: 1 mg/dL (ref 0.7–1.3)
Calcium: 9.2 mg/dL (ref 8.4–10.4)
Glucose: 94 mg/dl (ref 70–140)
Potassium: 3.7 mEq/L (ref 3.5–5.1)
Sodium: 144 mEq/L (ref 136–145)
Total Protein: 6.5 g/dL (ref 6.4–8.3)

## 2013-06-07 LAB — CBC WITH DIFFERENTIAL/PLATELET
BASO%: 0.4 % (ref 0.0–2.0)
Basophils Absolute: 0 10*3/uL (ref 0.0–0.1)
EOS%: 0.7 % (ref 0.0–7.0)
Eosinophils Absolute: 0 10*3/uL (ref 0.0–0.5)
HCT: 38.2 % — ABNORMAL LOW (ref 38.4–49.9)
HGB: 12.6 g/dL — ABNORMAL LOW (ref 13.0–17.1)
LYMPH#: 0.5 10*3/uL — AB (ref 0.9–3.3)
LYMPH%: 8.9 % — AB (ref 14.0–49.0)
MCH: 30.2 pg (ref 27.2–33.4)
MCHC: 33.1 g/dL (ref 32.0–36.0)
MCV: 91.3 fL (ref 79.3–98.0)
MONO#: 0.5 10*3/uL (ref 0.1–0.9)
MONO%: 8.7 % (ref 0.0–14.0)
NEUT#: 4.3 10*3/uL (ref 1.5–6.5)
NEUT%: 81.3 % — AB (ref 39.0–75.0)
Platelets: 116 10*3/uL — ABNORMAL LOW (ref 140–400)
RBC: 4.18 10*6/uL — ABNORMAL LOW (ref 4.20–5.82)
RDW: 16 % — ABNORMAL HIGH (ref 11.0–14.6)
WBC: 5.3 10*3/uL (ref 4.0–10.3)

## 2013-06-07 LAB — MAGNESIUM (CC13): Magnesium: 2.4 mg/dl (ref 1.5–2.5)

## 2013-06-07 MED ORDER — ATROPINE SULFATE 1 MG/ML IJ SOLN: 0.5000 mg | Freq: Once | INTRAMUSCULAR | Status: DC | PRN

## 2013-06-07 MED ORDER — PALONOSETRON HCL INJECTION 0.25 MG/5ML
0.2500 mg | Freq: Once | INTRAVENOUS | Status: AC
  Administered 2013-06-07: 0.25 mg via INTRAVENOUS

## 2013-06-07 MED ORDER — FLUOROURACIL CHEMO INJECTION 2.5 GM/50ML
320.0000 mg/m2 | Freq: Once | INTRAVENOUS | Status: AC
  Administered 2013-06-07: 650 mg via INTRAVENOUS
  Filled 2013-06-07: qty 13

## 2013-06-07 MED ORDER — BEVACIZUMAB CHEMO INJECTION 400 MG/16ML
5.0000 mg/kg | Freq: Once | INTRAVENOUS | Status: AC
  Administered 2013-06-07: 400 mg via INTRAVENOUS
  Filled 2013-06-07: qty 16

## 2013-06-07 MED ORDER — DEXAMETHASONE SODIUM PHOSPHATE 20 MG/5ML IJ SOLN
12.0000 mg | Freq: Once | INTRAMUSCULAR | Status: AC
  Administered 2013-06-07: 12 mg via INTRAVENOUS

## 2013-06-07 MED ORDER — PALONOSETRON HCL INJECTION 0.25 MG/5ML
INTRAVENOUS | Status: AC
  Filled 2013-06-07: qty 5

## 2013-06-07 MED ORDER — LEUCOVORIN CALCIUM INJECTION 350 MG
320.0000 mg/m2 | Freq: Once | INTRAVENOUS | Status: AC
  Administered 2013-06-07: 640 mg via INTRAVENOUS
  Filled 2013-06-07: qty 32

## 2013-06-07 MED ORDER — SODIUM CHLORIDE 0.9 % IV SOLN
Freq: Once | INTRAVENOUS | Status: AC
  Administered 2013-06-07: 11:00:00 via INTRAVENOUS

## 2013-06-07 MED ORDER — DEXAMETHASONE SODIUM PHOSPHATE 20 MG/5ML IJ SOLN
INTRAMUSCULAR | Status: AC
  Filled 2013-06-07: qty 5

## 2013-06-07 MED ORDER — IRINOTECAN HCL CHEMO INJECTION 100 MG/5ML
260.0000 mg/m2 | Freq: Once | INTRAVENOUS | Status: AC
  Administered 2013-06-07: 520 mg via INTRAVENOUS
  Filled 2013-06-07: qty 26

## 2013-06-07 MED ORDER — SODIUM CHLORIDE 0.9 % IV SOLN
2000.0000 mg/m2 | INTRAVENOUS | Status: DC
  Administered 2013-06-07: 4000 mg via INTRAVENOUS
  Filled 2013-06-07: qty 80

## 2013-06-07 MED ORDER — SODIUM CHLORIDE 0.9 % IV SOLN
150.0000 mg | Freq: Once | INTRAVENOUS | Status: AC
  Administered 2013-06-07: 150 mg via INTRAVENOUS
  Filled 2013-06-07: qty 5

## 2013-06-07 NOTE — Progress Notes (Signed)
06/07/2013 Patient in to clinic today for evaluation and Day 15 treatment in Cycle 2. He reports intermittent constipation, followed by grade 1 diarrhea, also intermittent. He continues to note skin sensitivity in his perineum at times. Based on lab results review and history and physical by Dr. Benay Spice, patient condition is acceptable for continued treatment. Sign for infusion given to Ann Lions RN. Cindy S. Brigitte Pulse BSN, RN, CCRP 06/07/2013 10:59 AM

## 2013-06-07 NOTE — Progress Notes (Signed)
Met with patient to assess for needs.  He stated he was doing okay.  He was upset that he had received a bill because he had to come to the ED on 2/212/15 due to mechanical pump failure.  He stated while he was in ED  that he called phone number on the pump, was able to correct the problem with the pump, and then he left.  This RN encouraged patient to call billing and explain what happened.  This RN also spoke with Medical Oncology manager who agreed patient should call billing.  This RN asked patient to contact this RN if further assistance was needed. This RN also reminded patient of monthly GI support group. No barriers to care identified at present time.

## 2013-06-07 NOTE — Telephone Encounter (Signed)
gv and printed appt sched and avs for pt for March adn April....sed added tx.

## 2013-06-07 NOTE — Progress Notes (Signed)
[place]    OFFICE PROGRESS NOTE   INTERVAL HISTORY:   He returns for scheduled followup of rectal cancer. He completed another cycle of FOLFIRI/Avastin 05/24/2013. No diarrhea or mouth sores. Mild nose bleeding. No symptom of venous or arterial thrombosis. He had a few "night sweats "following the most recent cycle of chemotherapy. He has not been taking potassium for the past 3 weeks.  Objective:  Vital signs in last 24 hours:  Blood pressure 143/74, pulse 71, temperature 97.5 F (36.4 C), temperature source Oral, resp. rate 18, height 5\' 10"  (1.778 m), weight 175 lb 4.8 oz (79.516 kg), SpO2 98.00%.    HEENT: No thrush or ulcers Resp: Lungs with end inspiratory rales at the left upper posterior chest, no respiratory distress Cardio: Regular rate and rhythm GI: No hepatomegaly, nontender Vascular: No leg edema  Portacath/PICC-without erythema  Lab Results:  Lab Results  Component Value Date   WBC 5.3 06/07/2013   HGB 12.6* 06/07/2013   HCT 38.2* 06/07/2013   MCV 91.3 06/07/2013   PLT 116* 06/07/2013   NEUTROABS 4.3 06/07/2013      Medications: I have reviewed the patient's current medications.  Assessment/Plan: 1. Rectal cancer. Partially obstructing mass noted 1-2 cm from the anal verge on a colonoscopy 03/06/2012. Endoscopic ultrasound 03/14/2012 with a 7.5 mm thick, 3.2 cm wide hypoechoic, irregularly bordered mass that clearly passed into and through the muscularis propria layer of the distal rectal wall (uT3); 3 small (largest 7 mm) perirectal lymph nodes. The lymph nodes were all round, discrete, hypoechoic, homogenous; suspicious for malignant involvement (uN1).  He began radiation and concurrent Xeloda chemotherapy on 03/20/2012, completed 04/27/2012.  Low anterior resection/coloanal anastomosis and diverting ileostomy 06/29/2012 with the final pathology revealing a T2N0 tumor with extensive fibrosis and negative margins.  Cycle 1 of adjuvant  CAPOX 07/19/2012. Cycle 5 of adjuvant CAPOX 10/11/2012.  CEA 2.5 on 11/14/2012.  CEA 14.6 03/08/2013.  Restaging CT evaluation 03/08/2013 with a 4 mm pulmonary nodule left lung base not identified on comparison exam; new liver lesions including a 29 x 26 mm irregular peripheral enhancing rounded lesion in the dome of the right hepatic lobe, a less well-defined new subcapsular lesion in the lateral right hepatic lobe measuring 12 mm, a new subcapsular lesion in the anterior right hepatic lobe adjacent to the gallbladder fossa measuring 10 mm; a rounded low-density lesion in the inferior right hepatic lobe measuring 10 mm compared to 7 mm on the prior study (radiologist commented this may represent an enlarged cyst).  MRI of the abdomen 04/12/2013 confirmed multiple T2 hyperintense metastatic lesions throughout the right liver  Initiation of FOLFIRI/Avastin with genotype based irinotecan dosing per the [facility] study 04/05/2013 2. Irregular bowel habits/rectal bleeding secondary to #1. 3. Mild elevation of the liver enzymes. Question secondary to hepatic steatosis. 4. Indeterminate 8 mm posterior right liver lesion on the staging CT 03/06/2012. 5. Mildly elevated CEA at 5.7 on 03/06/2012. 6. History of radiation erythema at the groin and perineum 7. 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 8. 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 No clinical evidence of thrombophlebitis or extravasation. 9. Delayed nausea following chemotherapy-Decadron prophylaxis was added with cycle 3 CAPOX-improved. 10. Ileostomy takedown 12/07/2012. 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 12. Port-A-Cath placement 12/31/204 13. Severe neutropenia secondary to chemotherapy following cycle 1 of FOLFIRI, chemotherapy was dose reduced and he received Neulasta with day 15 cycle 1  14. Nausea  and vomiting following cycle 1 of  FOLFIRI-the antiemetic regimen was adjusted with day 15 cycle 1 15. History of Hypokalemia. Improved.  Disposition:  Mr. Fouch appears stable. He will complete day 15 cycle 2 FOLFIRI/Avastin today. He will undergo a restaging CT evaluation prior to an office visit 06/21/2013.   Betsy Coder, MD  06/07/2013  10:25 AM

## 2013-06-07 NOTE — Patient Instructions (Signed)
[facility] Discharge Instructions for Patients Receiving Chemotherapy  Today you received the following chemotherapy agents: Avastin, Leucovorin, Irinotecan, 5FU (Adrucil)  To help prevent nausea and vomiting after your treatment, we encourage you to take your nausea medication as prescribed by your physician.   If you develop nausea and vomiting that is not controlled by your nausea medication, call the clinic.   BELOW ARE SYMPTOMS THAT SHOULD BE REPORTED IMMEDIATELY:  *FEVER GREATER THAN 100.5 F  *CHILLS WITH OR WITHOUT FEVER  NAUSEA AND VOMITING THAT IS NOT CONTROLLED WITH YOUR NAUSEA MEDICATION  *UNUSUAL SHORTNESS OF BREATH  *UNUSUAL BRUISING OR BLEEDING  TENDERNESS IN MOUTH AND THROAT WITH OR WITHOUT PRESENCE OF ULCERS  *URINARY PROBLEMS  *BOWEL PROBLEMS  UNUSUAL RASH Items with * indicate a potential emergency and should be followed up as soon as possible.  Feel free to call the clinic you have any questions or concerns. The clinic phone number is [phone].

## 2013-06-09 ENCOUNTER — Ambulatory Visit (HOSPITAL_BASED_OUTPATIENT_CLINIC_OR_DEPARTMENT_OTHER): Payer: BC Managed Care – PPO

## 2013-06-09 VITALS — BP 155/97 | HR 74 | Temp 98.3°F | Resp 18

## 2013-06-09 DIAGNOSIS — D702 Other drug-induced agranulocytosis: Secondary | ICD-10-CM

## 2013-06-09 MED ORDER — SODIUM CHLORIDE 0.9 % IJ SOLN
10.0000 mL | INTRAMUSCULAR | Status: DC | PRN
  Administered 2013-06-09: 10 mL
  Filled 2013-06-09: qty 10

## 2013-06-09 MED ORDER — PEGFILGRASTIM INJECTION 6 MG/0.6ML
6.0000 mg | Freq: Once | SUBCUTANEOUS | Status: AC
  Administered 2013-06-09: 6 mg via SUBCUTANEOUS

## 2013-06-09 MED ORDER — HEPARIN SOD (PORK) LOCK FLUSH 100 UNIT/ML IV SOLN
500.0000 [IU] | Freq: Once | INTRAVENOUS | Status: AC | PRN
  Administered 2013-06-09: 500 [IU]
  Filled 2013-06-09: qty 5

## 2013-06-17 ENCOUNTER — Other Ambulatory Visit: Payer: Self-pay | Admitting: Oncology

## 2013-06-20 ENCOUNTER — Other Ambulatory Visit: Payer: Self-pay

## 2013-06-20 ENCOUNTER — Ambulatory Visit (HOSPITAL_COMMUNITY)
Admission: RE | Admit: 2013-06-20 | Discharge: 2013-06-20 | Disposition: A | Discharge status: Home / Self Care | Payer: BC Managed Care – PPO | Source: Ambulatory Visit | Attending: Oncology | Admitting: Oncology

## 2013-06-20 DIAGNOSIS — C787 Secondary malignant neoplasm of liver and intrahepatic bile duct: Secondary | ICD-10-CM | POA: Insufficient documentation

## 2013-06-20 DIAGNOSIS — R911 Solitary pulmonary nodule: Secondary | ICD-10-CM | POA: Insufficient documentation

## 2013-06-20 DIAGNOSIS — C20 Malignant neoplasm of rectum: Secondary | ICD-10-CM | POA: Insufficient documentation

## 2013-06-20 MED ORDER — IOHEXOL 300 MG/ML  SOLN
100.0000 mL | Freq: Once | INTRAMUSCULAR | Status: AC | PRN
  Administered 2013-06-20: 100 mL via INTRAVENOUS

## 2013-06-21 ENCOUNTER — Ambulatory Visit (HOSPITAL_BASED_OUTPATIENT_CLINIC_OR_DEPARTMENT_OTHER): Payer: BC Managed Care – PPO

## 2013-06-21 ENCOUNTER — Encounter: Payer: BC Managed Care – PPO | Admitting: *Deleted

## 2013-06-21 ENCOUNTER — Telehealth: Payer: Self-pay | Admitting: Oncology

## 2013-06-21 ENCOUNTER — Other Ambulatory Visit (HOSPITAL_BASED_OUTPATIENT_CLINIC_OR_DEPARTMENT_OTHER): Payer: BC Managed Care – PPO

## 2013-06-21 ENCOUNTER — Ambulatory Visit (HOSPITAL_BASED_OUTPATIENT_CLINIC_OR_DEPARTMENT_OTHER): Payer: BC Managed Care – PPO | Admitting: Nurse Practitioner

## 2013-06-21 VITALS — BP 145/81 | HR 62 | Temp 97.4°F | Resp 20 | Ht 70.0 in | Wt 180.7 lb

## 2013-06-21 DIAGNOSIS — Z5112 Encounter for antineoplastic immunotherapy: Secondary | ICD-10-CM

## 2013-06-21 DIAGNOSIS — C20 Malignant neoplasm of rectum: Secondary | ICD-10-CM

## 2013-06-21 DIAGNOSIS — D702 Other drug-induced agranulocytosis: Secondary | ICD-10-CM

## 2013-06-21 DIAGNOSIS — G620 Drug-induced polyneuropathy: Secondary | ICD-10-CM

## 2013-06-21 DIAGNOSIS — E876 Hypokalemia: Secondary | ICD-10-CM

## 2013-06-21 LAB — CBC WITH DIFFERENTIAL/PLATELET
BASO%: 0.2 % (ref 0.0–2.0)
Basophils Absolute: 0 10*3/uL (ref 0.0–0.1)
EOS ABS: 0 10*3/uL (ref 0.0–0.5)
EOS%: 0.8 % (ref 0.0–7.0)
HCT: 38 % — ABNORMAL LOW (ref 38.4–49.9)
HGB: 12.5 g/dL — ABNORMAL LOW (ref 13.0–17.1)
LYMPH%: 11.9 % — AB (ref 14.0–49.0)
MCH: 30.7 pg (ref 27.2–33.4)
MCHC: 32.9 g/dL (ref 32.0–36.0)
MCV: 93.4 fL (ref 79.3–98.0)
MONO#: 0.5 10*3/uL (ref 0.1–0.9)
MONO%: 10 % (ref 0.0–14.0)
NEUT%: 77.1 % — ABNORMAL HIGH (ref 39.0–75.0)
NEUTROS ABS: 3.7 10*3/uL (ref 1.5–6.5)
PLATELETS: 109 10*3/uL — AB (ref 140–400)
RBC: 4.07 10*6/uL — AB (ref 4.20–5.82)
RDW: 16.2 % — ABNORMAL HIGH (ref 11.0–14.6)
WBC: 4.8 10*3/uL (ref 4.0–10.3)
lymph#: 0.6 10*3/uL — ABNORMAL LOW (ref 0.9–3.3)

## 2013-06-21 LAB — COMPREHENSIVE METABOLIC PANEL (CC13)
ALK PHOS: 78 U/L (ref 40–150)
ALT: 49 U/L (ref 0–55)
AST: 27 U/L (ref 5–34)
Albumin: 3.6 g/dL (ref 3.5–5.0)
Anion Gap: 12 mEq/L — ABNORMAL HIGH (ref 3–11)
BILIRUBIN TOTAL: 0.21 mg/dL (ref 0.20–1.20)
BUN: 12.9 mg/dL (ref 7.0–26.0)
CO2: 24 mEq/L (ref 22–29)
Calcium: 8.9 mg/dL (ref 8.4–10.4)
Chloride: 108 mEq/L (ref 98–109)
Creatinine: 0.8 mg/dL (ref 0.7–1.3)
GLUCOSE: 89 mg/dL (ref 70–140)
Potassium: 3.4 mEq/L — ABNORMAL LOW (ref 3.5–5.1)
SODIUM: 144 meq/L (ref 136–145)
Total Protein: 6.4 g/dL (ref 6.4–8.3)

## 2013-06-21 LAB — MAGNESIUM (CC13): MAGNESIUM: 2.6 mg/dL — AB (ref 1.5–2.5)

## 2013-06-21 LAB — RESEARCH LABS

## 2013-06-21 MED ORDER — PALONOSETRON HCL INJECTION 0.25 MG/5ML
0.2500 mg | Freq: Once | INTRAVENOUS | Status: AC
  Administered 2013-06-21: 0.25 mg via INTRAVENOUS

## 2013-06-21 MED ORDER — SODIUM CHLORIDE 0.9 % IV SOLN
150.0000 mg | Freq: Once | INTRAVENOUS | Status: AC
  Administered 2013-06-21: 150 mg via INTRAVENOUS
  Filled 2013-06-21: qty 5

## 2013-06-21 MED ORDER — ATROPINE SULFATE 1 MG/ML IJ SOLN: 0.5000 mg | Freq: Once | INTRAMUSCULAR | Status: DC | PRN

## 2013-06-21 MED ORDER — FLUOROURACIL CHEMO INJECTION 2.5 GM/50ML
320.0000 mg/m2 | Freq: Once | INTRAVENOUS | Status: AC
  Administered 2013-06-21: 650 mg via INTRAVENOUS
  Filled 2013-06-21: qty 13

## 2013-06-21 MED ORDER — ATROPINE SULFATE 1 MG/ML IJ SOLN
INTRAMUSCULAR | Status: AC
  Filled 2013-06-21: qty 1

## 2013-06-21 MED ORDER — LEUCOVORIN CALCIUM INJECTION 350 MG
320.0000 mg/m2 | Freq: Once | INTRAVENOUS | Status: AC
  Administered 2013-06-21: 640 mg via INTRAVENOUS
  Filled 2013-06-21: qty 32

## 2013-06-21 MED ORDER — SODIUM CHLORIDE 0.9 % IV SOLN
5.0000 mg/kg | Freq: Once | INTRAVENOUS | Status: AC
  Administered 2013-06-21: 400 mg via INTRAVENOUS
  Filled 2013-06-21: qty 16

## 2013-06-21 MED ORDER — SODIUM CHLORIDE 0.9 % IV SOLN
2000.0000 mg/m2 | INTRAVENOUS | Status: DC
  Administered 2013-06-21: 4000 mg via INTRAVENOUS
  Filled 2013-06-21: qty 80

## 2013-06-21 MED ORDER — IRINOTECAN HCL CHEMO INJECTION 100 MG/5ML
260.0000 mg/m2 | Freq: Once | INTRAVENOUS | Status: AC
  Administered 2013-06-21: 520 mg via INTRAVENOUS
  Filled 2013-06-21: qty 26

## 2013-06-21 MED ORDER — DEXAMETHASONE SODIUM PHOSPHATE 20 MG/5ML IJ SOLN
INTRAMUSCULAR | Status: AC
  Filled 2013-06-21: qty 5

## 2013-06-21 MED ORDER — DEXAMETHASONE SODIUM PHOSPHATE 20 MG/5ML IJ SOLN
12.0000 mg | Freq: Once | INTRAMUSCULAR | Status: AC
  Administered 2013-06-21: 12 mg via INTRAVENOUS

## 2013-06-21 MED ORDER — SODIUM CHLORIDE 0.9 % IV SOLN
Freq: Once | INTRAVENOUS | Status: AC
  Administered 2013-06-21: 11:00:00 via INTRAVENOUS

## 2013-06-21 MED ORDER — PALONOSETRON HCL INJECTION 0.25 MG/5ML
INTRAVENOUS | Status: AC
  Filled 2013-06-21: qty 5

## 2013-06-21 NOTE — Progress Notes (Addendum)
[place] OFFICE PROGRESS NOTE   Diagnosis:  Metastatic rectal cancer.  INTERVAL HISTORY:   Jacob [name] returns as scheduled. He completed cycle 2 day 15 FOLFIRI/Avastin 06/07/2013. He had more nausea than with previous cycles. Bowel habits alternating constipation and diarrhea. He noted a single sensitive area on the tongue. He denies abdominal pain. No shortness of breath or chest pain. He denies leg swelling and calf pain. He noted a small amount of blood in the toilet after straining for a bowel movement. He notes blood when he blows his nose.  Objective:  Vital signs in last 24 hours:  Blood pressure 145/81, pulse 62, temperature 97.4 F (36.3 C), temperature source Oral, resp. rate 20, height 5\' 10"  (1.778 m), weight 180 lb 11.2 oz (81.965 kg), SpO2 99.00%.    HEENT: No thrush or ulcerations. Resp: Lungs clear. Cardio: Regular cardiac rhythm. GI: Abdomen soft and nontender. No hepatomegaly. Vascular: No leg edema. Calves nontender.    Portacath/PICC-without erythema.  Lab Results:  Lab Results  Component Value Date   WBC 4.8 06/21/2013   HGB 12.5* 06/21/2013   HCT 38.0* 06/21/2013   MCV 93.4 06/21/2013   PLT 109* 06/21/2013   NEUTROABS 3.7 06/21/2013      Lab Results  Component Value Date   CEA 18.0* 04/02/2013    Imaging:  Ct Chest W Contrast  06/20/2013   CLINICAL DATA:  Restaging rectal cancer diagnosed in 2013 and 2014. Chemotherapy ongoing. Radiation therapy completed. RECIST protocol.  EXAM: CT CHEST, ABDOMEN, AND PELVIS WITH CONTRAST  TECHNIQUE: Multidetector CT imaging of the chest, abdomen and pelvis was performed following the standard protocol during bolus administration of intravenous contrast.  CONTRAST:  179mL OMNIPAQUE IOHEXOL 300 MG/ML  SOLN  COMPARISON:  DG CHEST 1V PORT dated 03/21/2013; CT CHEST W/CM dated 03/08/2013; CT CHEST W/CM dated 03/13/2012  FINDINGS: RECIST 1.1  Target Lesions:  1. Lesion in the dome of the right hepatic lobe  measures 1.7 cm on image 49 (previously 2.9 cm). 2. Subcapsular lesion in the posterior segment of the right hepatic lobe measures 6 mm on image 56 (previously 1.2 cm). Non-target Lesions:  1. The previously demonstrated left lower lobe pulmonary nodule has not significantly changed in size, measuring 4 mm on image 38. The lesion does demonstrate central cavitation, likely representing a treated metastasis. 2. Several additional hepatic metastases have decreased in size and density, further described below.  CT CHEST FINDINGS  Left subclavian Port-A-Cath extends to the SVC. There are no enlarged mediastinal, hilar or axillary lymph nodes.  The heart and great vessels are stable in appearance. There is no significant pleural or pericardial effusion.  Previously identified left lower lobe pulmonary nodule is unchanged in size, measuring 4 mm on image 38. This lesion does demonstrate new central cavitation most consistent with a treated metastasis. A 3 mm subpleural nodule in the left lower lobe on image 41 is stable. No new or enlarging nodules are identified.  CT ABDOMEN AND PELVIS FINDINGS  As described above, the dominant metastases within the right hepatic lobe have improved, measuring 1.7 cm on image 49 and 0.6 cm on image 56, and demonstrate decreased density. Another small lesion within the right hepatic lobe has also improved, measuring 5 mm on image 53 (previously 10 mm). 9 mm lesion inferiorly in the right hepatic lobe on image 61 is unchanged from the recent study. This was present on the older study and may reflect a cyst, although is larger than on  the baseline examination. Probable small metastases adjacent the gallbladder fossa (image 64) and inferiorly in the right lobe (image 72) have decreased in size. No new or enlarging lesions are identified.  There is no adrenal mass. The gallbladder, pancreas, spleen and kidneys appear unremarkable. There is no hydronephrosis.  There is no adenopathy, ascites or  peritoneal nodularity. Postsurgical changes within the right anterior abdominal wall are attributed to previous colostomy takedown. Presacral fibrotic changes are stable without evidence of recurrent focal mass. Rectal wall thickening appears stable. There is no evidence of bowel obstruction or extraluminal fluid collection.  There are no worrisome osseous findings.  IMPRESSION: 1. Left lower lobe pulmonary nodule is unchanged in size, but now cavitary, attributed to treated metastatic disease. No progressive disease demonstrated within the chest. 2. Interval decreased size and density of multiple hepatic metastases. 3. No evidence of extrahepatic metastatic disease. 4. Stable presacral fibrotic changes.   Electronically Signed   By: Camie Patience M.D.   On: 06/20/2013 09:37   Ct Abdomen Pelvis W Contrast  06/20/2013   CLINICAL DATA:  Restaging rectal cancer diagnosed in 2013 and 2014. Chemotherapy ongoing. Radiation therapy completed. RECIST protocol.  EXAM: CT CHEST, ABDOMEN, AND PELVIS WITH CONTRAST  TECHNIQUE: Multidetector CT imaging of the chest, abdomen and pelvis was performed following the standard protocol during bolus administration of intravenous contrast.  CONTRAST:  140mL OMNIPAQUE IOHEXOL 300 MG/ML  SOLN  COMPARISON:  DG CHEST 1V PORT dated 03/21/2013; CT CHEST W/CM dated 03/08/2013; CT CHEST W/CM dated 03/13/2012  FINDINGS: RECIST 1.1  Target Lesions:  1. Lesion in the dome of the right hepatic lobe measures 1.7 cm on image 49 (previously 2.9 cm). 2. Subcapsular lesion in the posterior segment of the right hepatic lobe measures 6 mm on image 56 (previously 1.2 cm). Non-target Lesions:  1. The previously demonstrated left lower lobe pulmonary nodule has not significantly changed in size, measuring 4 mm on image 38. The lesion does demonstrate central cavitation, likely representing a treated metastasis. 2. Several additional hepatic metastases have decreased in size and density, further described  below.  CT CHEST FINDINGS  Left subclavian Port-A-Cath extends to the SVC. There are no enlarged mediastinal, hilar or axillary lymph nodes.  The heart and great vessels are stable in appearance. There is no significant pleural or pericardial effusion.  Previously identified left lower lobe pulmonary nodule is unchanged in size, measuring 4 mm on image 38. This lesion does demonstrate new central cavitation most consistent with a treated metastasis. A 3 mm subpleural nodule in the left lower lobe on image 41 is stable. No new or enlarging nodules are identified.  CT ABDOMEN AND PELVIS FINDINGS  As described above, the dominant metastases within the right hepatic lobe have improved, measuring 1.7 cm on image 49 and 0.6 cm on image 56, and demonstrate decreased density. Another small lesion within the right hepatic lobe has also improved, measuring 5 mm on image 53 (previously 10 mm). 9 mm lesion inferiorly in the right hepatic lobe on image 61 is unchanged from the recent study. This was present on the older study and may reflect a cyst, although is larger than on the baseline examination. Probable small metastases adjacent the gallbladder fossa (image 64) and inferiorly in the right lobe (image 72) have decreased in size. No new or enlarging lesions are identified.  There is no adrenal mass. The gallbladder, pancreas, spleen and kidneys appear unremarkable. There is no hydronephrosis.  There is no adenopathy, ascites or  peritoneal nodularity. Postsurgical changes within the right anterior abdominal wall are attributed to previous colostomy takedown. Presacral fibrotic changes are stable without evidence of recurrent focal mass. Rectal wall thickening appears stable. There is no evidence of bowel obstruction or extraluminal fluid collection.  There are no worrisome osseous findings.  IMPRESSION: 1. Left lower lobe pulmonary nodule is unchanged in size, but now cavitary, attributed to treated metastatic disease. No  progressive disease demonstrated within the chest. 2. Interval decreased size and density of multiple hepatic metastases. 3. No evidence of extrahepatic metastatic disease. 4. Stable presacral fibrotic changes.   Electronically Signed   By: Camie Patience M.D.   On: 06/20/2013 09:37    Medications: I have reviewed the patient's current medications.  Assessment/Plan: 1. Rectal cancer. Partially obstructing mass noted 1-2 cm from the anal verge on a colonoscopy 03/06/2012. Endoscopic ultrasound 03/14/2012 with a 7.5 mm thick, 3.2 cm wide hypoechoic, irregularly bordered mass that clearly passed into and through the muscularis propria layer of the distal rectal wall (uT3); 3 small (largest 7 mm) perirectal lymph nodes. The lymph nodes were all round, discrete, hypoechoic, homogenous; suspicious for malignant involvement (uN1).  He began radiation and concurrent Xeloda chemotherapy on 03/20/2012, completed 04/27/2012.  Low anterior resection/coloanal anastomosis and diverting ileostomy 06/29/2012 with the final pathology revealing a T2N0 tumor with extensive fibrosis and negative margins.  Cycle 1 of adjuvant CAPOX 07/19/2012. Cycle 5 of adjuvant CAPOX 10/11/2012.  CEA 2.5 on 11/14/2012.  CEA 14.6 03/08/2013.  Restaging CT evaluation 03/08/2013 with a 4 mm pulmonary nodule left lung base not identified on comparison exam; new liver lesions including a 29 x 26 mm irregular peripheral enhancing rounded lesion in the dome of the right hepatic lobe, a less well-defined new subcapsular lesion in the lateral right hepatic lobe measuring 12 mm, a new subcapsular lesion in the anterior right hepatic lobe adjacent to the gallbladder fossa measuring 10 mm; a rounded low-density lesion in the inferior right hepatic lobe measuring 10 mm compared to 7 mm on the prior study (radiologist commented this may represent an enlarged cyst).  MRI of the abdomen 04/12/2013 confirmed multiple T2 hyperintense metastatic lesions  throughout the right liver  Initiation of FOLFIRI/Avastin with genotype based irinotecan dosing per the [facility] study 04/05/2013. Restaging CT evaluation on 06/20/2013 (after 2 cycles/4 treatments) showed improvement in the liver metastases and stable size of a left lower lobe pulmonary nodule now with central cavitation. 2. Irregular bowel habits/rectal bleeding secondary to #1. 3. Mild elevation of the liver enzymes. Question secondary to hepatic steatosis. 4. Indeterminate 8 mm posterior right liver lesion on the staging CT 03/06/2012. 5. Mildly elevated CEA at 5.7 on 03/06/2012. 6. History of radiation erythema at the groin and perineum 7. 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 8. 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 No clinical evidence of thrombophlebitis or extravasation. 9. Delayed nausea following chemotherapy-Decadron prophylaxis was added with cycle 3 CAPOX-improved. 10. Ileostomy takedown 12/07/2012. 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 12. Port-A-Cath placement 12/31/204 13. Severe neutropenia secondary to chemotherapy following cycle 1 of FOLFIRI, chemotherapy was dose reduced and he received Neulasta with day 15 cycle 1  14. Nausea and vomiting following cycle 1 of FOLFIRI-the antiemetic regimen was adjusted with day 15 cycle 1 15. History of hypokalemia. He has mild hypokalemia today. He will resume Kdur 20 mEq daily.   Disposition: He appears stable. He has completed 2 cycles of FOLFIRI/Avastin (4 treatments).  Restaging CT evaluation showed improvement. Plan to proceed with cycle 3 day 1 today as scheduled. He will return for a followup visit and cycle 3 day 15 in 2 weeks. He will contact the office in the interim with any problems. We specifically discussed recurrent/persistent rectal bleeding.    Patient seen with Dr. Benay Spice. Dr. Benay Spice reviewed the CT  result and images on the computer with Jacob [name] and his mother. 25 minutes were spent face-to-face at today's visit with the majority of that time involved in counseling/coordination of care.    Ned Card ANP/GNP-BC   06/21/2013  10:30 AM  This was a shared visit with Ned Card. The restaging CT confirms a response to the FOLFIRI/Avastin. The plan is to continue treatment on protocol. We will consider the indication for a hepatectomy and resection of lung lesion after the next restaging evaluation.  Julieanne Manson, M.D.

## 2013-06-21 NOTE — Telephone Encounter (Signed)
gv pt appt schedule for april/may. pt has old referral for GI (colonoscopy - entered nov) to be done april 2015. per pt he and BS talked and he can hold off on this being that he is still going through chemo.

## 2013-06-21 NOTE — Progress Notes (Signed)
06/21/2013 Patient in to clinic today for evaluation prior to receiving treatment cycle 3. Research blood samples were collected prior to treatment by venipuncture in the lab.  CT scan results reviewed by Dr. Benay Spice who confirms presence of partial response. Results shared with patient by Dr. Benay Spice. Based on lab results review and history and physical by Ned Card NP, patient condition is acceptable for continued treatment. Sign for infusion was given to [place]. Cindy S. Brigitte Pulse BSN, RN, [place] 06/21/2013 10:53 AM

## 2013-06-22 LAB — CEA: CEA: 1.8 ng/mL (ref 0.0–5.0)

## 2013-06-23 ENCOUNTER — Ambulatory Visit (HOSPITAL_BASED_OUTPATIENT_CLINIC_OR_DEPARTMENT_OTHER): Payer: BC Managed Care – PPO

## 2013-06-23 VITALS — BP 125/75 | HR 87 | Temp 98.7°F | Resp 16

## 2013-06-23 DIAGNOSIS — C20 Malignant neoplasm of rectum: Secondary | ICD-10-CM

## 2013-06-23 DIAGNOSIS — Z5189 Encounter for other specified aftercare: Secondary | ICD-10-CM

## 2013-06-23 MED ORDER — PEGFILGRASTIM INJECTION 6 MG/0.6ML
6.0000 mg | Freq: Once | SUBCUTANEOUS | Status: AC
  Administered 2013-06-23: 6 mg via SUBCUTANEOUS

## 2013-06-23 MED ORDER — SODIUM CHLORIDE 0.9 % IJ SOLN
10.0000 mL | INTRAMUSCULAR | Status: DC | PRN
  Administered 2013-06-23: 10 mL
  Filled 2013-06-23: qty 10

## 2013-06-23 MED ORDER — HEPARIN SOD (PORK) LOCK FLUSH 100 UNIT/ML IV SOLN
500.0000 [IU] | Freq: Once | INTRAVENOUS | Status: AC | PRN
  Administered 2013-06-23: 500 [IU]
  Filled 2013-06-23: qty 5

## 2013-07-03 ENCOUNTER — Encounter (INDEPENDENT_AMBULATORY_CARE_PROVIDER_SITE_OTHER): Payer: Self-pay | Admitting: General Surgery

## 2013-07-03 ENCOUNTER — Ambulatory Visit (INDEPENDENT_AMBULATORY_CARE_PROVIDER_SITE_OTHER): Payer: BC Managed Care – PPO | Admitting: General Surgery

## 2013-07-03 VITALS — BP 116/70 | HR 78 | Temp 97.0°F | Resp 16 | Ht 70.0 in | Wt 179.0 lb

## 2013-07-03 DIAGNOSIS — C20 Malignant neoplasm of rectum: Secondary | ICD-10-CM

## 2013-07-03 NOTE — Patient Instructions (Signed)
Return to the office in 6 months for a followup exam.

## 2013-07-03 NOTE — Progress Notes (Signed)
Jacob [name] is a 43 y.o. male who is here for a follow up visit regarding his rectal cancer. He is ~1 mo s/pLAR and coloanal anastomosis. He is having some urgency, but this is getting better. He has completed pelvic floor physical therapy and does note some improvement after this.  He still has mild to moderate constipation which she controls with stool softeners.  His constipation gets worse with chemo.   1. Cancer history: Rectal cancer. Partially obstructing mass noted 1-2 cm from the anal verge on a colonoscopy 03/06/2012. Endoscopic ultrasound 03/14/2012 with a 7.5 mm thick, 3.2 cm wide hypoechoic, irregularly bordered mass that clearly passed into and through the muscularis propria layer of the distal rectal wall (uT3); 3 small (largest 7 mm) perirectal lymph nodes. The lymph nodes were all round, discrete, hypoechoic, homogenous; suspicious for malignant involvement (uN1).  He began radiation and concurrent Xeloda chemotherapy on 03/20/2012, completed 04/27/2012.  Low anterior resection/coloanal anastomosis and diverting ileostomy 06/29/2012 with the final pathology revealing a T2N0 tumor with extensive fibrosis and negative margins.  Cycle 1 of adjuvant CAPOX 07/19/2012. Cycle 5 of adjuvant CAPOX 10/11/2012.  CEA 2.5 on 11/14/2012.  CEA 14.6 03/08/2013.  Restaging CT evaluation 03/08/2013 with a 4 mm pulmonary nodule left lung base not identified on comparison exam; new liver lesions including a 29 x 26 mm irregular peripheral enhancing rounded lesion in the dome of the right hepatic lobe, a less well-defined new subcapsular lesion in the lateral right hepatic lobe measuring 12 mm, a new subcapsular lesion in the anterior right hepatic lobe adjacent to the gallbladder fossa measuring 10 mm; a rounded low-density lesion in the inferior right hepatic lobe measuring 10 mm compared to 7 mm on the prior study (radiologist commented this may represent an enlarged cyst).  MRI of the abdomen 04/12/2013  confirmed multiple T2 hyperintense metastatic lesions throughout the right liver  Initiation of FOLFIRI/Avastin with genotype based irinotecan dosing per the [facility] study 04/05/2013.  Restaging CT evaluation on 06/20/2013 (after 2 cycles/4 treatments) showed improvement in the liver metastases and stable size of a left lower lobe pulmonary nodule now with central cavitation. 2. Mild elevation of the liver enzymes. Question secondary to hepatic steatosis. 3. Indeterminate 8 mm posterior right liver lesion on the staging CT 03/06/2012. 4. Mildly elevated CEA at 5.7 on 03/06/2012. 5. Ileostomy takedown 12/07/2012. 6. Oxaliplatin neuropathy. 7. Port-A-Cath placement 03/21/2013    Objective:  Filed Vitals:   07/03/13 1149  BP: 116/70  Pulse: 78  Temp: 97 F (36.1 C)  Resp: 16    General appearance: alert and cooperative  GI: soft, non-tender; bowel sounds normal; no masses, no organomegaly  Rectal exam reveals mild anorectal stricture with no signs of recurrent disease. Rectum and anal canal are soft without any abnormal masses. Assessment and Plan:  Currently undergoing chemotherapy for metastatic rectal cancer. The patient will complete this in late May. We'll get CT scan followup after this. May need liver resection in the future.  He also needs a repeat colonoscopy within the next few months as well. Return to the office in 6 months for DRE.    Marland KitchenRosario Adie, MD  [facility], [place]

## 2013-07-05 ENCOUNTER — Ambulatory Visit (HOSPITAL_BASED_OUTPATIENT_CLINIC_OR_DEPARTMENT_OTHER): Payer: BC Managed Care – PPO | Admitting: Oncology

## 2013-07-05 ENCOUNTER — Encounter: Payer: BC Managed Care – PPO | Admitting: *Deleted

## 2013-07-05 ENCOUNTER — Other Ambulatory Visit (HOSPITAL_BASED_OUTPATIENT_CLINIC_OR_DEPARTMENT_OTHER): Payer: BC Managed Care – PPO

## 2013-07-05 ENCOUNTER — Ambulatory Visit (HOSPITAL_BASED_OUTPATIENT_CLINIC_OR_DEPARTMENT_OTHER): Payer: BC Managed Care – PPO

## 2013-07-05 ENCOUNTER — Telehealth: Payer: Self-pay | Admitting: Oncology

## 2013-07-05 VITALS — BP 143/93 | HR 62 | Temp 97.9°F | Resp 18

## 2013-07-05 VITALS — BP 126/80 | HR 74 | Temp 97.5°F | Resp 18 | Ht 70.0 in | Wt 178.1 lb

## 2013-07-05 DIAGNOSIS — C20 Malignant neoplasm of rectum: Secondary | ICD-10-CM

## 2013-07-05 DIAGNOSIS — K7689 Other specified diseases of liver: Secondary | ICD-10-CM

## 2013-07-05 DIAGNOSIS — Z5112 Encounter for antineoplastic immunotherapy: Secondary | ICD-10-CM

## 2013-07-05 DIAGNOSIS — Z5111 Encounter for antineoplastic chemotherapy: Secondary | ICD-10-CM

## 2013-07-05 DIAGNOSIS — R748 Abnormal levels of other serum enzymes: Secondary | ICD-10-CM

## 2013-07-05 DIAGNOSIS — G620 Drug-induced polyneuropathy: Secondary | ICD-10-CM

## 2013-07-05 LAB — CBC WITH DIFFERENTIAL/PLATELET
BASO%: 0.5 % (ref 0.0–2.0)
Basophils Absolute: 0 10*3/uL (ref 0.0–0.1)
EOS%: 0.8 % (ref 0.0–7.0)
Eosinophils Absolute: 0 10*3/uL (ref 0.0–0.5)
HCT: 37.8 % — ABNORMAL LOW (ref 38.4–49.9)
HGB: 12.5 g/dL — ABNORMAL LOW (ref 13.0–17.1)
LYMPH%: 12.8 % — ABNORMAL LOW (ref 14.0–49.0)
MCH: 30.7 pg (ref 27.2–33.4)
MCHC: 33 g/dL (ref 32.0–36.0)
MCV: 93 fL (ref 79.3–98.0)
MONO#: 0.3 10*3/uL (ref 0.1–0.9)
MONO%: 9.1 % (ref 0.0–14.0)
NEUT#: 2.6 10*3/uL (ref 1.5–6.5)
NEUT%: 76.8 % — ABNORMAL HIGH (ref 39.0–75.0)
Platelets: 107 10*3/uL — ABNORMAL LOW (ref 140–400)
RBC: 4.07 10*6/uL — ABNORMAL LOW (ref 4.20–5.82)
RDW: 17.6 % — AB (ref 11.0–14.6)
WBC: 3.4 10*3/uL — AB (ref 4.0–10.3)
lymph#: 0.4 10*3/uL — ABNORMAL LOW (ref 0.9–3.3)

## 2013-07-05 LAB — COMPREHENSIVE METABOLIC PANEL (CC13)
ALBUMIN: 3.6 g/dL (ref 3.5–5.0)
ALT: 58 U/L — ABNORMAL HIGH (ref 0–55)
AST: 35 U/L — AB (ref 5–34)
Alkaline Phosphatase: 74 U/L (ref 40–150)
Anion Gap: 8 mEq/L (ref 3–11)
BILIRUBIN TOTAL: 0.26 mg/dL (ref 0.20–1.20)
BUN: 13 mg/dL (ref 7.0–26.0)
CO2: 24 mEq/L (ref 22–29)
Calcium: 8.7 mg/dL (ref 8.4–10.4)
Chloride: 111 mEq/L — ABNORMAL HIGH (ref 98–109)
Creatinine: 0.9 mg/dL (ref 0.7–1.3)
Glucose: 126 mg/dl (ref 70–140)
POTASSIUM: 3.6 meq/L (ref 3.5–5.1)
SODIUM: 143 meq/L (ref 136–145)
TOTAL PROTEIN: 6.2 g/dL — AB (ref 6.4–8.3)

## 2013-07-05 LAB — UA PROTEIN, DIPSTICK - CHCC

## 2013-07-05 LAB — MAGNESIUM (CC13): MAGNESIUM: 2.3 mg/dL (ref 1.5–2.5)

## 2013-07-05 MED ORDER — DEXAMETHASONE SODIUM PHOSPHATE 20 MG/5ML IJ SOLN
12.0000 mg | Freq: Once | INTRAMUSCULAR | Status: AC
  Administered 2013-07-05: 12 mg via INTRAVENOUS

## 2013-07-05 MED ORDER — ATROPINE SULFATE 1 MG/ML IJ SOLN
0.5000 mg | Freq: Once | INTRAMUSCULAR | Status: AC | PRN
  Administered 2013-07-05: 0.5 mg via INTRAVENOUS

## 2013-07-05 MED ORDER — DEXAMETHASONE SODIUM PHOSPHATE 20 MG/5ML IJ SOLN
INTRAMUSCULAR | Status: AC
  Filled 2013-07-05: qty 5

## 2013-07-05 MED ORDER — DEXTROSE 5 % IV SOLN
320.0000 mg/m2 | Freq: Once | INTRAVENOUS | Status: AC
  Administered 2013-07-05: 640 mg via INTRAVENOUS
  Filled 2013-07-05: qty 32

## 2013-07-05 MED ORDER — IRINOTECAN HCL CHEMO INJECTION 100 MG/5ML
260.0000 mg/m2 | Freq: Once | INTRAVENOUS | Status: AC
  Administered 2013-07-05: 520 mg via INTRAVENOUS
  Filled 2013-07-05: qty 26

## 2013-07-05 MED ORDER — FLUOROURACIL CHEMO INJECTION 2.5 GM/50ML
320.0000 mg/m2 | Freq: Once | INTRAVENOUS | Status: AC
  Administered 2013-07-05: 650 mg via INTRAVENOUS
  Filled 2013-07-05: qty 13

## 2013-07-05 MED ORDER — LORAZEPAM 1 MG PO TABS
ORAL_TABLET | ORAL | Status: AC
  Filled 2013-07-05: qty 1

## 2013-07-05 MED ORDER — ATROPINE SULFATE 1 MG/ML IJ SOLN
INTRAMUSCULAR | Status: AC
  Filled 2013-07-05: qty 1

## 2013-07-05 MED ORDER — SODIUM CHLORIDE 0.9 % IV SOLN
Freq: Once | INTRAVENOUS | Status: AC
  Administered 2013-07-05: 12:00:00 via INTRAVENOUS

## 2013-07-05 MED ORDER — PALONOSETRON HCL INJECTION 0.25 MG/5ML
0.2500 mg | Freq: Once | INTRAVENOUS | Status: AC
  Administered 2013-07-05: 0.25 mg via INTRAVENOUS

## 2013-07-05 MED ORDER — PALONOSETRON HCL INJECTION 0.25 MG/5ML
INTRAVENOUS | Status: AC
  Filled 2013-07-05: qty 5

## 2013-07-05 MED ORDER — SODIUM CHLORIDE 0.9 % IV SOLN
5.0000 mg/kg | Freq: Once | INTRAVENOUS | Status: AC
  Administered 2013-07-05: 400 mg via INTRAVENOUS
  Filled 2013-07-05: qty 16

## 2013-07-05 MED ORDER — SODIUM CHLORIDE 0.9 % IV SOLN
150.0000 mg | Freq: Once | INTRAVENOUS | Status: AC
  Administered 2013-07-05: 150 mg via INTRAVENOUS
  Filled 2013-07-05: qty 5

## 2013-07-05 MED ORDER — SODIUM CHLORIDE 0.9 % IV SOLN
2000.0000 mg/m2 | INTRAVENOUS | Status: DC
  Administered 2013-07-05: 4000 mg via INTRAVENOUS
  Filled 2013-07-05: qty 80

## 2013-07-05 MED ORDER — LORAZEPAM 1 MG PO TABS
1.0000 mg | ORAL_TABLET | Freq: Once | ORAL | Status: AC
  Administered 2013-07-05: 1 mg via SUBLINGUAL

## 2013-07-05 NOTE — Progress Notes (Signed)
07/05/2013 Patient in to clinic this morning for evaluation prior to receiving Cycle 3, Day 15 treatment. Patient reports ongoing, intermittent side effects as previously noted, including one episode of grade 1 diarrhea that occurred last night and required no treatment. Based on lab results review and history and physical by Dr. Benay Spice, patient condition is acceptable for continued treatment. Sign for infusion given to and reviewed with Thu [place]. Cindy S. Brigitte Pulse BSN, RN, CCRP 07/05/2013 1:53 PM

## 2013-07-05 NOTE — Patient Instructions (Addendum)
[place] Discharge Instructions for Patients Receiving Chemotherapy  Today you received the following chemotherapy agents :  Avastin, Camptosar, Leucovorin, Fluorouracil.  To help prevent nausea and vomiting after your treatment, we encourage you to take your nausea medication as instructed by your physician. Eat prunes, drink prune juice,  Activia yogurt, and apple sauce to help with constipation problem.  If dietary support not helping, you can take Miralax 17g by mouth daily; take Colace, or Senokot-S.   If you develop nausea and vomiting that is not controlled by your nausea medication, call the clinic.   BELOW ARE SYMPTOMS THAT SHOULD BE REPORTED IMMEDIATELY:  *FEVER GREATER THAN 100.5 F  *CHILLS WITH OR WITHOUT FEVER  NAUSEA AND VOMITING THAT IS NOT CONTROLLED WITH YOUR NAUSEA MEDICATION  *UNUSUAL SHORTNESS OF BREATH  *UNUSUAL BRUISING OR BLEEDING  TENDERNESS IN MOUTH AND THROAT WITH OR WITHOUT PRESENCE OF ULCERS  *URINARY PROBLEMS  *BOWEL PROBLEMS  UNUSUAL RASH Items with * indicate a potential emergency and should be followed up as soon as possible.  Feel free to call the clinic you have any questions or concerns. The clinic phone number is [phone].

## 2013-07-05 NOTE — Progress Notes (Signed)
Pt saw Jacob [name] prior to chemo today.  Proceed with chemo as planned per Jacob [name], research nurse.  Per Jacob [name], Jacob [name] had reviewed all lab results today. 1500 -  Pt complained of nausea but no vomiting.  Jacob [name] notified.  Order received for Ativan 1mg  Sublingual.  Explanations given to pt and Jacob [name]. 1535 -  Pt still complained of nausea, feeling flushed, wiping face.  Pt stated " feeling uneasy " .  Atropine 0.5mg  given IV. Pt was offered Atropine prior to Camptosar, but pt refused.   Pt had completed Camptosar at 1500.  Explanations given to pt.  Pt agreed to receive Atropine IVP.   1600 -  Pt stated relief of nausea, and feeling uneasy.  Pt stated he felt much better.  No facial flushing and no sweating noted. 91 -  Pt was stable at discharge via ambulation with nurse and Jacob [name].  Pt was stable and sat down in the lobby waiting for ride by Jacob [name]. Jacob [name], research nurse notified of above info.

## 2013-07-05 NOTE — Progress Notes (Signed)
[facility] OFFICE PROGRESS NOTE   Diagnosis: Rectal cancer  INTERVAL HISTORY:   Jacob [name] completed another cycle of FOLFIRI/Avastin 06/21/2013. He reports mild nausea following chemotherapy. No emesis. He had diarrhea yesterday. He has bleeding when he blows his dose. He reports "sweats "on the day of chemotherapy. Stable neuropathy symptoms in the feet.  Objective:  Vital signs in last 24 hours:  Blood pressure 126/80, pulse 74, temperature 97.5 F (36.4 C), temperature source Oral, resp. rate 18, height 5\' 10"  (1.778 m), weight 178 lb 1.6 oz (80.786 kg).    HEENT: No thrush or ulcers Resp: Lungs clear bilaterally Cardio: Regular rate and rhythm GI: No hepatosplenomegaly Vascular: No leg edema Skin: Skin thickening and mild hyperpigmentation of the hand   Portacath/PICC-without erythema  Lab Results:  Lab Results  Component Value Date   WBC 3.4* 07/05/2013   HGB 12.5* 07/05/2013   HCT 37.8* 07/05/2013   MCV 93.0 07/05/2013   PLT 107* 07/05/2013   NEUTROABS 2.6 07/05/2013     Lab Results  Component Value Date   CEA 1.8 06/21/2013   Medications: I have reviewed the patient's current medications.  Assessment/Plan: 1. Rectal cancer. Partially obstructing mass noted 1-2 cm from the anal verge on a colonoscopy 03/06/2012. Endoscopic ultrasound 03/14/2012 with a 7.5 mm thick, 3.2 cm wide hypoechoic, irregularly bordered mass that clearly passed into and through the muscularis propria layer of the distal rectal wall (uT3); 3 small (largest 7 mm) perirectal lymph nodes. The lymph nodes were all round, discrete, hypoechoic, homogenous; suspicious for malignant involvement (uN1).  He began radiation and concurrent Xeloda chemotherapy on 03/20/2012, completed 04/27/2012.  Low anterior resection/coloanal anastomosis and diverting ileostomy 06/29/2012 with the final pathology revealing a T2N0 tumor with extensive fibrosis and negative margins.  Cycle 1 of adjuvant CAPOX  07/19/2012. Cycle 5 of adjuvant CAPOX 10/11/2012.  CEA 2.5 on 11/14/2012.  CEA 14.6 03/08/2013.  Restaging CT evaluation 03/08/2013 with a 4 mm pulmonary nodule left lung base not identified on comparison exam; new liver lesions including a 29 x 26 mm irregular peripheral enhancing rounded lesion in the dome of the right hepatic lobe, a less well-defined new subcapsular lesion in the lateral right hepatic lobe measuring 12 mm, a new subcapsular lesion in the anterior right hepatic lobe adjacent to the gallbladder fossa measuring 10 mm; a rounded low-density lesion in the inferior right hepatic lobe measuring 10 mm compared to 7 mm on the prior study (radiologist commented this may represent an enlarged cyst).  MRI of the abdomen 04/12/2013 confirmed multiple T2 hyperintense metastatic lesions throughout the right liver  Initiation of FOLFIRI/Avastin with genotype based irinotecan dosing per the [facility] study 04/05/2013.  Restaging CT evaluation on 06/20/2013 (after 2 cycles/4 treatments) showed improvement in the liver metastases and stable size of a left lower lobe pulmonary nodule now with central cavitation. 2. Irregular bowel habits/rectal bleeding secondary to #1. 3. Mild elevation of the liver enzymes. Question secondary to hepatic steatosis. 4. Indeterminate 8 mm posterior right liver lesion on the staging CT 03/06/2012. 5. Mildly elevated CEA at 5.7 on 03/06/2012. 6. History of radiation erythema at the groin and perineum 7. 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 No clinical evidence of thrombophlebitis or extravasation. 8. Delayed nausea following chemotherapy-Decadron prophylaxis was added with cycle 3 CAPOX-improved. 9. Ileostomy takedown 12/07/2012. 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 11. Port-A-Cath placement 12/31/204 12. Severe neutropenia secondary to chemotherapy following cycle 1 of  FOLFIRI,  chemotherapy was dose reduced and he received Neulasta with day 15 cycle 1  13. Nausea and vomiting following cycle 1 of FOLFIRI-the antiemetic regimen was adjusted with day 15 cycle 1    Disposition:  He appears stable. The plan is to proceed with day 15 cycle 3 of FOLFIRI/Avastin today. Jacob [name] will return for an office visit and chemotherapy in 2 weeks.  Ladell Pier, MD  07/05/2013  11:30 AM

## 2013-07-05 NOTE — Telephone Encounter (Signed)
gv pt appt schedule for april/may. central willl call w/ct appts.

## 2013-07-07 ENCOUNTER — Ambulatory Visit (HOSPITAL_BASED_OUTPATIENT_CLINIC_OR_DEPARTMENT_OTHER): Payer: BC Managed Care – PPO

## 2013-07-07 VITALS — BP 138/84 | HR 79 | Temp 97.4°F | Resp 18

## 2013-07-07 DIAGNOSIS — D702 Other drug-induced agranulocytosis: Secondary | ICD-10-CM

## 2013-07-07 MED ORDER — PEGFILGRASTIM INJECTION 6 MG/0.6ML
6.0000 mg | Freq: Once | SUBCUTANEOUS | Status: AC
  Administered 2013-07-07: 6 mg via SUBCUTANEOUS
  Filled 2013-07-07: qty 0.6

## 2013-07-07 MED ORDER — HEPARIN SOD (PORK) LOCK FLUSH 100 UNIT/ML IV SOLN
500.0000 [IU] | Freq: Once | INTRAVENOUS | Status: AC | PRN
  Administered 2013-07-07: 500 [IU]
  Filled 2013-07-07: qty 5

## 2013-07-07 MED ORDER — SODIUM CHLORIDE 0.9 % IJ SOLN
10.0000 mL | INTRAMUSCULAR | Status: DC | PRN
  Administered 2013-07-07: 10 mL
  Filled 2013-07-07: qty 10

## 2013-07-15 ENCOUNTER — Other Ambulatory Visit: Payer: Self-pay | Admitting: Oncology

## 2013-07-19 ENCOUNTER — Ambulatory Visit (HOSPITAL_BASED_OUTPATIENT_CLINIC_OR_DEPARTMENT_OTHER): Payer: BC Managed Care – PPO

## 2013-07-19 ENCOUNTER — Encounter: Payer: BC Managed Care – PPO | Admitting: *Deleted

## 2013-07-19 ENCOUNTER — Other Ambulatory Visit (HOSPITAL_BASED_OUTPATIENT_CLINIC_OR_DEPARTMENT_OTHER): Payer: BC Managed Care – PPO

## 2013-07-19 ENCOUNTER — Ambulatory Visit (HOSPITAL_BASED_OUTPATIENT_CLINIC_OR_DEPARTMENT_OTHER): Payer: BC Managed Care – PPO | Admitting: Nurse Practitioner

## 2013-07-19 VITALS — BP 129/71 | HR 64 | Resp 18

## 2013-07-19 VITALS — BP 131/75 | HR 62 | Temp 97.4°F | Resp 18 | Ht 70.0 in | Wt 173.3 lb

## 2013-07-19 DIAGNOSIS — C20 Malignant neoplasm of rectum: Secondary | ICD-10-CM

## 2013-07-19 DIAGNOSIS — C787 Secondary malignant neoplasm of liver and intrahepatic bile duct: Secondary | ICD-10-CM

## 2013-07-19 DIAGNOSIS — Z5112 Encounter for antineoplastic immunotherapy: Secondary | ICD-10-CM

## 2013-07-19 DIAGNOSIS — K1370 Unspecified lesions of oral mucosa: Secondary | ICD-10-CM

## 2013-07-19 DIAGNOSIS — Z5111 Encounter for antineoplastic chemotherapy: Secondary | ICD-10-CM

## 2013-07-19 DIAGNOSIS — G620 Drug-induced polyneuropathy: Secondary | ICD-10-CM

## 2013-07-19 LAB — COMPREHENSIVE METABOLIC PANEL (CC13)
ALBUMIN: 3.5 g/dL (ref 3.5–5.0)
ALT: 53 U/L (ref 0–55)
AST: 28 U/L (ref 5–34)
Alkaline Phosphatase: 72 U/L (ref 40–150)
Anion Gap: 10 mEq/L (ref 3–11)
BUN: 12.6 mg/dL (ref 7.0–26.0)
CALCIUM: 9.2 mg/dL (ref 8.4–10.4)
CHLORIDE: 109 meq/L (ref 98–109)
CO2: 24 mEq/L (ref 22–29)
Creatinine: 0.9 mg/dL (ref 0.7–1.3)
GLUCOSE: 82 mg/dL (ref 70–140)
POTASSIUM: 3.9 meq/L (ref 3.5–5.1)
Sodium: 144 mEq/L (ref 136–145)
Total Bilirubin: 0.26 mg/dL (ref 0.20–1.20)
Total Protein: 6.3 g/dL — ABNORMAL LOW (ref 6.4–8.3)

## 2013-07-19 LAB — CBC WITH DIFFERENTIAL/PLATELET
BASO%: 0.4 % (ref 0.0–2.0)
Basophils Absolute: 0 10*3/uL (ref 0.0–0.1)
EOS ABS: 0 10*3/uL (ref 0.0–0.5)
EOS%: 0.7 % (ref 0.0–7.0)
HCT: 36.5 % — ABNORMAL LOW (ref 38.4–49.9)
HEMOGLOBIN: 11.9 g/dL — AB (ref 13.0–17.1)
LYMPH%: 10 % — ABNORMAL LOW (ref 14.0–49.0)
MCH: 30.6 pg (ref 27.2–33.4)
MCHC: 32.6 g/dL (ref 32.0–36.0)
MCV: 93.8 fL (ref 79.3–98.0)
MONO#: 0.4 10*3/uL (ref 0.1–0.9)
MONO%: 9.5 % (ref 0.0–14.0)
NEUT%: 79.4 % — ABNORMAL HIGH (ref 39.0–75.0)
NEUTROS ABS: 3.3 10*3/uL (ref 1.5–6.5)
Platelets: 122 10*3/uL — ABNORMAL LOW (ref 140–400)
RBC: 3.89 10*6/uL — ABNORMAL LOW (ref 4.20–5.82)
RDW: 17.1 % — AB (ref 11.0–14.6)
WBC: 4.1 10*3/uL (ref 4.0–10.3)
lymph#: 0.4 10*3/uL — ABNORMAL LOW (ref 0.9–3.3)

## 2013-07-19 LAB — MAGNESIUM (CC13): MAGNESIUM: 2.3 mg/dL (ref 1.5–2.5)

## 2013-07-19 MED ORDER — DEXAMETHASONE SODIUM PHOSPHATE 20 MG/5ML IJ SOLN
12.0000 mg | Freq: Once | INTRAMUSCULAR | Status: AC
  Administered 2013-07-19: 12 mg via INTRAVENOUS

## 2013-07-19 MED ORDER — MAGIC MOUTHWASH: 10.0000 mL | Freq: Three times a day (TID) | ORAL | Status: DC | PRN

## 2013-07-19 MED ORDER — ATROPINE SULFATE 1 MG/ML IJ SOLN
0.5000 mg | Freq: Once | INTRAMUSCULAR | Status: AC | PRN
  Administered 2013-07-19: 0.5 mg via INTRAVENOUS

## 2013-07-19 MED ORDER — PALONOSETRON HCL INJECTION 0.25 MG/5ML
INTRAVENOUS | Status: AC
  Filled 2013-07-19: qty 5

## 2013-07-19 MED ORDER — PALONOSETRON HCL INJECTION 0.25 MG/5ML
0.2500 mg | Freq: Once | INTRAVENOUS | Status: AC
  Administered 2013-07-19: 0.25 mg via INTRAVENOUS

## 2013-07-19 MED ORDER — SODIUM CHLORIDE 0.9 % IV SOLN
5.0000 mg/kg | Freq: Once | INTRAVENOUS | Status: AC
  Administered 2013-07-19: 400 mg via INTRAVENOUS
  Filled 2013-07-19: qty 16

## 2013-07-19 MED ORDER — DEXAMETHASONE SODIUM PHOSPHATE 20 MG/5ML IJ SOLN
INTRAMUSCULAR | Status: AC
  Filled 2013-07-19: qty 5

## 2013-07-19 MED ORDER — FLUOROURACIL CHEMO INJECTION 2.5 GM/50ML
320.0000 mg/m2 | Freq: Once | INTRAVENOUS | Status: AC
  Administered 2013-07-19: 650 mg via INTRAVENOUS
  Filled 2013-07-19: qty 13

## 2013-07-19 MED ORDER — ATROPINE SULFATE 1 MG/ML IJ SOLN
INTRAMUSCULAR | Status: AC
  Filled 2013-07-19: qty 1

## 2013-07-19 MED ORDER — IRINOTECAN HCL CHEMO INJECTION 100 MG/5ML
260.0000 mg/m2 | Freq: Once | INTRAVENOUS | Status: AC
  Administered 2013-07-19: 520 mg via INTRAVENOUS
  Filled 2013-07-19: qty 26

## 2013-07-19 MED ORDER — DEXAMETHASONE 4 MG PO TABS: ORAL_TABLET | ORAL | Status: DC

## 2013-07-19 MED ORDER — SODIUM CHLORIDE 0.9 % IV SOLN
2000.0000 mg/m2 | INTRAVENOUS | Status: DC
  Administered 2013-07-19: 4000 mg via INTRAVENOUS
  Filled 2013-07-19: qty 80

## 2013-07-19 MED ORDER — SODIUM CHLORIDE 0.9 % IV SOLN
150.0000 mg | Freq: Once | INTRAVENOUS | Status: AC
  Administered 2013-07-19: 150 mg via INTRAVENOUS
  Filled 2013-07-19: qty 5

## 2013-07-19 MED ORDER — LEUCOVORIN CALCIUM INJECTION 350 MG
320.0000 mg/m2 | Freq: Once | INTRAMUSCULAR | Status: AC
  Administered 2013-07-19: 640 mg via INTRAVENOUS
  Filled 2013-07-19: qty 32

## 2013-07-19 MED ORDER — SODIUM CHLORIDE 0.9 % IV SOLN
Freq: Once | INTRAVENOUS | Status: AC
  Administered 2013-07-19: 11:00:00 via INTRAVENOUS

## 2013-07-19 NOTE — Progress Notes (Signed)
[place] OFFICE PROGRESS NOTE   Diagnosis:  Metastatic rectal cancer.  INTERVAL HISTORY:   Mr. Woolstenhulme returns as scheduled. He completed cycle 3 day 15 on 07/05/2013. He has mild nausea for a few days after the treatment. He had an episode of "sweats" during the last treatment. This improved with atropine. He noted increased frequency of bowel movements. This was followed by loose stools lasting for 6 days. Max number of loose stools in a 24-hour period estimated at 6. He did not take an antidiarrheal. The loose stools resolved 3-4 days ago. He developed a "sore" on his tongue which is now better. He continues to note blood with nose blowing. He denies shortness of breath and chest pain. No abdominal pain. No leg swelling or calf pain.  Objective:  Vital signs in last 24 hours:  Blood pressure 131/75, pulse 62, temperature 97.4 F (36.3 C), temperature source Oral, resp. rate 18, height 5\' 10"  (1.778 m), weight 173 lb 4.8 oz (78.608 kg), SpO2 100.00%.    HEENT: Tiny healing ulceration at the right lateral tongue. Resp: Lungs clear. Cardio: Regular cardiac rhythm. GI: Soft and nontender. No hepatomegaly. Vascular: No leg edema. Calves nontender.   Lab Results:  Lab Results  Component Value Date   WBC 4.1 07/19/2013   HGB 11.9* 07/19/2013   HCT 36.5* 07/19/2013   MCV 93.8 07/19/2013   PLT 122* 07/19/2013   NEUTROABS 3.3 07/19/2013    Imaging:  No results found.  Medications: I have reviewed the patient's current medications.  Assessment/Plan: 1. Rectal cancer. Partially obstructing mass noted 1-2 cm from the anal verge on a colonoscopy 03/06/2012. Endoscopic ultrasound 03/14/2012 with a 7.5 mm thick, 3.2 cm wide hypoechoic, irregularly bordered mass that clearly passed into and through the muscularis propria layer of the distal rectal wall (uT3); 3 small (largest 7 mm) perirectal lymph nodes. The lymph nodes were all round, discrete, hypoechoic, homogenous;  suspicious for malignant involvement (uN1).  He began radiation and concurrent Xeloda chemotherapy on 03/20/2012, completed 04/27/2012.  Low anterior resection/coloanal anastomosis and diverting ileostomy 06/29/2012 with the final pathology revealing a T2N0 tumor with extensive fibrosis and negative margins.  Cycle 1 of adjuvant CAPOX 07/19/2012. Cycle 5 of adjuvant CAPOX 10/11/2012.  CEA 2.5 on 11/14/2012.  CEA 14.6 03/08/2013.  Restaging CT evaluation 03/08/2013 with a 4 mm pulmonary nodule left lung base not identified on comparison exam; new liver lesions including a 29 x 26 mm irregular peripheral enhancing rounded lesion in the dome of the right hepatic lobe, a less well-defined new subcapsular lesion in the lateral right hepatic lobe measuring 12 mm, a new subcapsular lesion in the anterior right hepatic lobe adjacent to the gallbladder fossa measuring 10 mm; a rounded low-density lesion in the inferior right hepatic lobe measuring 10 mm compared to 7 mm on the prior study (radiologist commented this may represent an enlarged cyst).  MRI of the abdomen 04/12/2013 confirmed multiple T2 hyperintense metastatic lesions throughout the right liver  Initiation of FOLFIRI/Avastin with genotype based irinotecan dosing per the [facility] study 04/05/2013.  Restaging CT evaluation on 06/20/2013 (after 2 cycles/4 treatments) showed improvement in the liver metastases and stable size of a left lower lobe pulmonary nodule now with central cavitation. Continuation of FOLFIRI/Avastin. 2. Irregular bowel habits/rectal bleeding secondary to #1. 3. Mild elevation of the liver enzymes. Question secondary to hepatic steatosis. 4. Indeterminate 8 mm posterior right liver lesion on the staging CT 03/06/2012. 5. Mildly elevated CEA at 5.7 on  03/06/2012. 6. History of radiation erythema at the groin and perineum 7. Right hand/arm tenderness and numbness following cycle 1 oxaliplatin-likely related to a local toxicity from  oxaliplatin/neuropathy. No clinical evidence of thrombophlebitis or extravasation. 8. Delayed nausea following chemotherapy-Decadron prophylaxis was added with cycle 3 CAPOX-improved. 9. Ileostomy takedown 12/07/2012. 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 11. Port-A-Cath placement 12/31/204 12. Severe neutropenia secondary to chemotherapy following cycle 1 of FOLFIRI, chemotherapy was dose reduced and he received Neulasta with day 15 cycle 1  13. Nausea and vomiting following cycle 1 of FOLFIRI-the antiemetic regimen was adjusted with day 15 cycle 1. 14. Increased frequency of bowel movements followed by loose stools following cycle 3 day 15 FOLFIRI. Improved.   Disposition: He appears stable. Plan to proceed with cycle 4 day 1 FOLFIRI/Avastin today as scheduled. He will return for cycle 4 day 15 in 2 weeks.  He will try Imodium if he again develops diarrhea. He understands to contact the office if the Imodium is not effective.  He would like to have Magic mouthwash as needed. We will send a prescription to his pharmacy.  We will see him prior to the next treatment in 2 weeks. He will contact the office in the interim as outlined above or with any other problems.   Plan reviewed with Dr. Benay Spice.    Owens Shark ANP/GNP-BC   07/19/2013  10:13 AM

## 2013-07-19 NOTE — Progress Notes (Signed)
07/19/2013 Patient in to clinic today for evaluation prior to beginning treatment cycle 4. Patient reports experiencing up to six diarrheal stools per day (grade 2) beginning Monday, April 20th and lasting six days. This resolved by the weekend, without use of Imodium. Encouraged patient to take Imodium to control diarrhea as instructed, and to ensure prescribed potassium supplementation and increased fluid intake. Patient notes decreased food intake, but not necessarily due to decreased appetite, stating that he sometimes chose not to eat due to effect on bowels. Patient notes development of mouth sore 3 days ago which interfered with his food intake (grade 2), but this has now resolved to grade 1. He also notes the occurrence of fever blisters on his mouth at the same time. Based on lab results review and history and physical by Ned Card NP, patient condition is acceptable for continued treatment. Sign for infusion given to Jaci Carrel RN, with discussion of genotype-based dosing, and dose modifications required after cycle 1, day 1 treatment. Cindy S. Brigitte Pulse BSN, RN, CCRP 07/19/2013 11:55 AM

## 2013-07-19 NOTE — Patient Instructions (Signed)
[name] Discharge Instructions for Patients Receiving Chemotherapy  Today you received the following chemotherapy agents Avastin, Camptosar, Leucovorin and 5FU.  To help prevent nausea and vomiting after your treatment, we encourage you to take your nausea medication.   If you develop nausea and vomiting that is not controlled by your nausea medication, call the clinic.   BELOW ARE SYMPTOMS THAT SHOULD BE REPORTED IMMEDIATELY:  *FEVER GREATER THAN 100.5 F  *CHILLS WITH OR WITHOUT FEVER  NAUSEA AND VOMITING THAT IS NOT CONTROLLED WITH YOUR NAUSEA MEDICATION  *UNUSUAL SHORTNESS OF BREATH  *UNUSUAL BRUISING OR BLEEDING  TENDERNESS IN MOUTH AND THROAT WITH OR WITHOUT PRESENCE OF ULCERS  *URINARY PROBLEMS  *BOWEL PROBLEMS  UNUSUAL RASH Items with * indicate a potential emergency and should be followed up as soon as possible.  Feel free to call the clinic you have any questions or concerns. The clinic phone number is [phone].

## 2013-07-21 ENCOUNTER — Ambulatory Visit (HOSPITAL_BASED_OUTPATIENT_CLINIC_OR_DEPARTMENT_OTHER): Payer: BC Managed Care – PPO

## 2013-07-21 VITALS — BP 138/76 | HR 82 | Temp 96.9°F | Resp 16

## 2013-07-21 DIAGNOSIS — Z5189 Encounter for other specified aftercare: Secondary | ICD-10-CM

## 2013-07-21 DIAGNOSIS — C787 Secondary malignant neoplasm of liver and intrahepatic bile duct: Secondary | ICD-10-CM

## 2013-07-21 DIAGNOSIS — C20 Malignant neoplasm of rectum: Secondary | ICD-10-CM

## 2013-07-21 MED ORDER — HEPARIN SOD (PORK) LOCK FLUSH 100 UNIT/ML IV SOLN
500.0000 [IU] | Freq: Once | INTRAVENOUS | Status: AC | PRN
  Administered 2013-07-21: 500 [IU]
  Filled 2013-07-21: qty 5

## 2013-07-21 MED ORDER — SODIUM CHLORIDE 0.9 % IJ SOLN
10.0000 mL | INTRAMUSCULAR | Status: DC | PRN
  Administered 2013-07-21: 10 mL
  Filled 2013-07-21: qty 10

## 2013-07-21 MED ORDER — PEGFILGRASTIM INJECTION 6 MG/0.6ML
6.0000 mg | Freq: Once | SUBCUTANEOUS | Status: AC
  Administered 2013-07-21: 6 mg via SUBCUTANEOUS

## 2013-07-21 NOTE — Patient Instructions (Signed)

## 2013-07-27 ENCOUNTER — Other Ambulatory Visit: Payer: BC Managed Care – PPO

## 2013-07-30 ENCOUNTER — Ambulatory Visit: Payer: BC Managed Care – PPO | Admitting: Nurse Practitioner

## 2013-08-02 ENCOUNTER — Ambulatory Visit (HOSPITAL_BASED_OUTPATIENT_CLINIC_OR_DEPARTMENT_OTHER): Payer: BC Managed Care – PPO | Admitting: Nurse Practitioner

## 2013-08-02 ENCOUNTER — Ambulatory Visit (HOSPITAL_BASED_OUTPATIENT_CLINIC_OR_DEPARTMENT_OTHER): Payer: BC Managed Care – PPO

## 2013-08-02 ENCOUNTER — Encounter: Payer: BC Managed Care – PPO | Admitting: *Deleted

## 2013-08-02 ENCOUNTER — Other Ambulatory Visit (HOSPITAL_BASED_OUTPATIENT_CLINIC_OR_DEPARTMENT_OTHER): Payer: BC Managed Care – PPO

## 2013-08-02 VITALS — BP 130/82 | HR 70 | Temp 97.1°F | Resp 20 | Ht 70.0 in | Wt 175.8 lb

## 2013-08-02 DIAGNOSIS — Z5111 Encounter for antineoplastic chemotherapy: Secondary | ICD-10-CM

## 2013-08-02 DIAGNOSIS — C20 Malignant neoplasm of rectum: Secondary | ICD-10-CM

## 2013-08-02 DIAGNOSIS — C787 Secondary malignant neoplasm of liver and intrahepatic bile duct: Secondary | ICD-10-CM

## 2013-08-02 DIAGNOSIS — Z5112 Encounter for antineoplastic immunotherapy: Secondary | ICD-10-CM

## 2013-08-02 DIAGNOSIS — D696 Thrombocytopenia, unspecified: Secondary | ICD-10-CM

## 2013-08-02 DIAGNOSIS — Z006 Encounter for examination for normal comparison and control in clinical research program: Secondary | ICD-10-CM

## 2013-08-02 DIAGNOSIS — D702 Other drug-induced agranulocytosis: Secondary | ICD-10-CM

## 2013-08-02 LAB — CBC WITH DIFFERENTIAL/PLATELET
BASO%: 1 % (ref 0.0–2.0)
BASOS ABS: 0.1 10*3/uL (ref 0.0–0.1)
EOS%: 0.5 % (ref 0.0–7.0)
Eosinophils Absolute: 0 10*3/uL (ref 0.0–0.5)
HEMATOCRIT: 35.5 % — AB (ref 38.4–49.9)
HEMOGLOBIN: 11.7 g/dL — AB (ref 13.0–17.1)
LYMPH%: 6.8 % — ABNORMAL LOW (ref 14.0–49.0)
MCH: 30.8 pg (ref 27.2–33.4)
MCHC: 32.8 g/dL (ref 32.0–36.0)
MCV: 93.8 fL (ref 79.3–98.0)
MONO#: 0.6 10*3/uL (ref 0.1–0.9)
MONO%: 9 % (ref 0.0–14.0)
NEUT#: 5.1 10*3/uL (ref 1.5–6.5)
NEUT%: 82.7 % — AB (ref 39.0–75.0)
Platelets: 98 10*3/uL — ABNORMAL LOW (ref 140–400)
RBC: 3.78 10*6/uL — ABNORMAL LOW (ref 4.20–5.82)
RDW: 17.7 % — ABNORMAL HIGH (ref 11.0–14.6)
WBC: 6.2 10*3/uL (ref 4.0–10.3)
lymph#: 0.4 10*3/uL — ABNORMAL LOW (ref 0.9–3.3)

## 2013-08-02 LAB — COMPREHENSIVE METABOLIC PANEL (CC13)
ALT: 43 U/L (ref 0–55)
ANION GAP: 12 meq/L — AB (ref 3–11)
AST: 31 U/L (ref 5–34)
Albumin: 3.4 g/dL — ABNORMAL LOW (ref 3.5–5.0)
Alkaline Phosphatase: 67 U/L (ref 40–150)
BILIRUBIN TOTAL: 0.22 mg/dL (ref 0.20–1.20)
BUN: 12.1 mg/dL (ref 7.0–26.0)
CO2: 24 meq/L (ref 22–29)
Calcium: 9.1 mg/dL (ref 8.4–10.4)
Chloride: 110 mEq/L — ABNORMAL HIGH (ref 98–109)
Creatinine: 0.9 mg/dL (ref 0.7–1.3)
GLUCOSE: 82 mg/dL (ref 70–140)
Potassium: 4.2 mEq/L (ref 3.5–5.1)
Sodium: 146 mEq/L — ABNORMAL HIGH (ref 136–145)
Total Protein: 6 g/dL — ABNORMAL LOW (ref 6.4–8.3)

## 2013-08-02 LAB — MAGNESIUM (CC13): Magnesium: 2.5 mg/dl (ref 1.5–2.5)

## 2013-08-02 LAB — UA PROTEIN, DIPSTICK - CHCC: Protein, ur: NEGATIVE mg/dL

## 2013-08-02 MED ORDER — PALONOSETRON HCL INJECTION 0.25 MG/5ML
0.2500 mg | Freq: Once | INTRAVENOUS | Status: AC
  Administered 2013-08-02: 0.25 mg via INTRAVENOUS

## 2013-08-02 MED ORDER — LEUCOVORIN CALCIUM INJECTION 350 MG
320.0000 mg/m2 | Freq: Once | INTRAVENOUS | Status: AC
  Administered 2013-08-02: 640 mg via INTRAVENOUS
  Filled 2013-08-02: qty 32

## 2013-08-02 MED ORDER — ATROPINE SULFATE 1 MG/ML IJ SOLN
0.2500 mg | Freq: Once | INTRAMUSCULAR | Status: AC
  Administered 2013-08-02: 0.25 mg via INTRAVENOUS

## 2013-08-02 MED ORDER — ATROPINE SULFATE 1 MG/ML IJ SOLN
0.5000 mg | Freq: Once | INTRAMUSCULAR | Status: AC | PRN
  Administered 2013-08-02: 0.25 mg via INTRAVENOUS

## 2013-08-02 MED ORDER — PALONOSETRON HCL INJECTION 0.25 MG/5ML
INTRAVENOUS | Status: AC
  Filled 2013-08-02: qty 5

## 2013-08-02 MED ORDER — SODIUM CHLORIDE 0.9 % IV SOLN
5.0000 mg/kg | Freq: Once | INTRAVENOUS | Status: AC
  Administered 2013-08-02: 400 mg via INTRAVENOUS
  Filled 2013-08-02: qty 16

## 2013-08-02 MED ORDER — FLUOROURACIL CHEMO INJECTION 2.5 GM/50ML
320.0000 mg/m2 | Freq: Once | INTRAVENOUS | Status: AC
  Administered 2013-08-02: 650 mg via INTRAVENOUS
  Filled 2013-08-02: qty 13

## 2013-08-02 MED ORDER — SODIUM CHLORIDE 0.9 % IV SOLN
Freq: Once | INTRAVENOUS | Status: AC
  Administered 2013-08-02: 11:00:00 via INTRAVENOUS

## 2013-08-02 MED ORDER — SODIUM CHLORIDE 0.9 % IV SOLN
2000.0000 mg/m2 | INTRAVENOUS | Status: DC
  Administered 2013-08-02: 4000 mg via INTRAVENOUS
  Filled 2013-08-02: qty 80

## 2013-08-02 MED ORDER — SODIUM CHLORIDE 0.9 % IV SOLN
150.0000 mg | Freq: Once | INTRAVENOUS | Status: AC
  Administered 2013-08-02: 150 mg via INTRAVENOUS
  Filled 2013-08-02: qty 5

## 2013-08-02 MED ORDER — SODIUM CHLORIDE 0.9 % IJ SOLN
10.0000 mL | INTRAMUSCULAR | Status: DC | PRN
  Filled 2013-08-02: qty 10

## 2013-08-02 MED ORDER — DEXAMETHASONE SODIUM PHOSPHATE 20 MG/5ML IJ SOLN
12.0000 mg | Freq: Once | INTRAMUSCULAR | Status: AC
  Administered 2013-08-02: 12 mg via INTRAVENOUS

## 2013-08-02 MED ORDER — ATROPINE SULFATE 1 MG/ML IJ SOLN
INTRAMUSCULAR | Status: AC
  Filled 2013-08-02: qty 1

## 2013-08-02 MED ORDER — IRINOTECAN HCL CHEMO INJECTION 100 MG/5ML
260.0000 mg/m2 | Freq: Once | INTRAVENOUS | Status: AC
  Administered 2013-08-02: 520 mg via INTRAVENOUS
  Filled 2013-08-02: qty 26

## 2013-08-02 MED ORDER — DEXAMETHASONE SODIUM PHOSPHATE 20 MG/5ML IJ SOLN
INTRAMUSCULAR | Status: AC
  Filled 2013-08-02: qty 5

## 2013-08-02 NOTE — Progress Notes (Signed)
Is research patient, must complete 46 hours of home infusion. Do not bolus.

## 2013-08-02 NOTE — Progress Notes (Signed)
[place] OFFICE PROGRESS NOTE   Diagnosis:  Metastatic rectal cancer.  INTERVAL HISTORY:   Jacob [name] returns as scheduled. He completed cycle 4 day 1 on 07/19/2013. He had mild nausea. No vomiting. No mouth sores. No significant diarrhea. He had a loose stool last night. Over the past 3-4 days he has noted less blood with nose blowing. No other bleeding. He denies chest pain and shortness of breath. No abdominal pain. No leg swelling or calf pain.  Objective:  Vital signs in last 24 hours:  Blood pressure 130/82, pulse 70, temperature 97.1 F (36.2 C), temperature source Oral, resp. rate 20, height 5\' 10"  (1.778 m), weight 175 lb 12.8 oz (79.742 kg), SpO2 100.00%.    HEENT: No thrush or ulcerations. Resp: Lungs clear. Cardio: Regular cardiac rhythm. GI: Soft and nontender. No hepatomegaly. Vascular: No leg edema. Calves nontender.   Port-A-Cath site is without erythema.    Lab Results:  Lab Results  Component Value Date   WBC 6.2 08/02/2013   HGB 11.7* 08/02/2013   HCT 35.5* 08/02/2013   MCV 93.8 08/02/2013   PLT 98* 08/02/2013   NEUTROABS 5.1 08/02/2013    Imaging:  No results found.  Medications: I have reviewed the patient's current medications.  Assessment/Plan: 1. Rectal cancer. Partially obstructing mass noted 1-2 cm from the anal verge on a colonoscopy 03/06/2012. Endoscopic ultrasound 03/14/2012 with a 7.5 mm thick, 3.2 cm wide hypoechoic, irregularly bordered mass that clearly passed into and through the muscularis propria layer of the distal rectal wall (uT3); 3 small (largest 7 mm) perirectal lymph nodes. The lymph nodes were all round, discrete, hypoechoic, homogenous; suspicious for malignant involvement (uN1).  He began radiation and concurrent Xeloda chemotherapy on 03/20/2012, completed 04/27/2012.  Low anterior resection/coloanal anastomosis and diverting ileostomy 06/29/2012 with the final pathology revealing a T2N0 tumor with extensive  fibrosis and negative margins.  Cycle 1 of adjuvant CAPOX 07/19/2012. Cycle 5 of adjuvant CAPOX 10/11/2012.  CEA 2.5 on 11/14/2012.  CEA 14.6 03/08/2013.  Restaging CT evaluation 03/08/2013 with a 4 mm pulmonary nodule left lung base not identified on comparison exam; new liver lesions including a 29 x 26 mm irregular peripheral enhancing rounded lesion in the dome of the right hepatic lobe, a less well-defined new subcapsular lesion in the lateral right hepatic lobe measuring 12 mm, a new subcapsular lesion in the anterior right hepatic lobe adjacent to the gallbladder fossa measuring 10 mm; a rounded low-density lesion in the inferior right hepatic lobe measuring 10 mm compared to 7 mm on the prior study (radiologist commented this may represent an enlarged cyst).  MRI of the abdomen 04/12/2013 confirmed multiple T2 hyperintense metastatic lesions throughout the right liver  Initiation of FOLFIRI/Avastin with genotype based irinotecan dosing per the [facility] study 04/05/2013.  Restaging CT evaluation on 06/20/2013 (after 2 cycles/4 treatments) showed improvement in the liver metastases and stable size of a left lower lobe pulmonary nodule now with central cavitation.  Continuation of FOLFIRI/Avastin. 2. Irregular bowel habits/rectal bleeding secondary to #1. 3. Mild elevation of the liver enzymes. Question secondary to hepatic steatosis. 4. Indeterminate 8 mm posterior right liver lesion on the staging CT 03/06/2012. 5. Mildly elevated CEA at 5.7 on 03/06/2012. 6. History of radiation erythema at the groin and perineum 7. 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 No clinical evidence of thrombophlebitis or extravasation. 8. Delayed nausea following chemotherapy-Decadron prophylaxis was added with cycle 3  CAPOX-improved. 9. Ileostomy takedown 12/07/2012. 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 11. Port-A-Cath placement  12/31/204 12. Severe neutropenia secondary to chemotherapy following cycle 1 of FOLFIRI, chemotherapy was dose reduced and he received Neulasta with day 15 cycle 1  13. Nausea and vomiting following cycle 1 of FOLFIRI-the antiemetic regimen was adjusted with day 15 cycle 1. 14. Increased frequency of bowel movements followed by loose stools following cycle 3 day 15 FOLFIRI. Improved.   Disposition: He appears stable. Plan to proceed with cycle 4 day 15 FOLFIRI/Avastin today as scheduled.   He has mild thrombocytopenia on labs today. He will contact the office with any bleeding.   Restaging CT evaluation is scheduled on 08/14/2013. He will return for a followup visit on 08/16/2013.    Owens Shark ANP/GNP-BC   08/02/2013  10:25 AM

## 2013-08-02 NOTE — Progress Notes (Signed)
08/02/2013 Patient in to clinic this morning for evaluation prior to receiving cycle 4, day 15 treatment. Overall, patient feels that this past cycle went well. His only complaint was mild (grade 1) diarrhea yesterday evening. He denies any mouth sores this past cycle, with mouth ulcers and fever blisters resolved for about a week, but is planning to get the Magic Mouthwash prescription filled to have on hand. He reports no problems with having received atropine pre-medication with cycle 4, day 1, based on his symptoms from cycle 3, day 15 treatment. Appetite has been somewhat decreased since the last treatment cycle. Based on lab results review and history and physical by Ned Card, NP, and as reviewed with Dr. Benay Spice, patient condition is felt to be acceptable for continued treatment. Out of range chemistry values were not felt to be clinically significant. Sign for infusion given to Jonelle Sports RN. Cindy S. Brigitte Pulse BSN, RN, [place] 08/02/2013 3:03 PM

## 2013-08-04 ENCOUNTER — Ambulatory Visit (HOSPITAL_BASED_OUTPATIENT_CLINIC_OR_DEPARTMENT_OTHER): Payer: BC Managed Care – PPO

## 2013-08-04 VITALS — BP 124/76 | HR 100 | Temp 97.6°F

## 2013-08-04 DIAGNOSIS — C20 Malignant neoplasm of rectum: Secondary | ICD-10-CM

## 2013-08-04 DIAGNOSIS — Z5189 Encounter for other specified aftercare: Secondary | ICD-10-CM

## 2013-08-04 MED ORDER — PEGFILGRASTIM INJECTION 6 MG/0.6ML
6.0000 mg | Freq: Once | SUBCUTANEOUS | Status: AC
  Administered 2013-08-04: 6 mg via SUBCUTANEOUS

## 2013-08-04 MED ORDER — SODIUM CHLORIDE 0.9 % IJ SOLN
10.0000 mL | INTRAMUSCULAR | Status: DC | PRN
  Administered 2013-08-04: 10 mL
  Filled 2013-08-04: qty 10

## 2013-08-04 MED ORDER — HEPARIN SOD (PORK) LOCK FLUSH 100 UNIT/ML IV SOLN
500.0000 [IU] | Freq: Once | INTRAVENOUS | Status: AC | PRN
  Administered 2013-08-04: 500 [IU]
  Filled 2013-08-04: qty 5

## 2013-08-13 ENCOUNTER — Other Ambulatory Visit: Payer: Self-pay | Admitting: Oncology

## 2013-08-14 ENCOUNTER — Encounter (HOSPITAL_COMMUNITY): Payer: Self-pay

## 2013-08-14 ENCOUNTER — Ambulatory Visit (HOSPITAL_COMMUNITY)
Admission: RE | Admit: 2013-08-14 | Discharge: 2013-08-14 | Disposition: A | Discharge status: Home / Self Care | Payer: BC Managed Care – PPO | Source: Ambulatory Visit | Attending: Oncology | Admitting: Oncology

## 2013-08-14 DIAGNOSIS — C20 Malignant neoplasm of rectum: Secondary | ICD-10-CM

## 2013-08-14 DIAGNOSIS — C787 Secondary malignant neoplasm of liver and intrahepatic bile duct: Secondary | ICD-10-CM | POA: Insufficient documentation

## 2013-08-14 MED ORDER — IOHEXOL 300 MG/ML  SOLN
100.0000 mL | Freq: Once | INTRAMUSCULAR | Status: AC | PRN
  Administered 2013-08-14: 100 mL via INTRAVENOUS

## 2013-08-16 ENCOUNTER — Ambulatory Visit: Payer: BC Managed Care – PPO

## 2013-08-16 ENCOUNTER — Encounter: Payer: BC Managed Care – PPO | Admitting: *Deleted

## 2013-08-16 ENCOUNTER — Ambulatory Visit (HOSPITAL_BASED_OUTPATIENT_CLINIC_OR_DEPARTMENT_OTHER): Payer: BC Managed Care – PPO | Admitting: Oncology

## 2013-08-16 ENCOUNTER — Other Ambulatory Visit (HOSPITAL_BASED_OUTPATIENT_CLINIC_OR_DEPARTMENT_OTHER): Payer: BC Managed Care – PPO

## 2013-08-16 ENCOUNTER — Telehealth: Payer: Self-pay | Admitting: Oncology

## 2013-08-16 VITALS — BP 130/73 | HR 71 | Temp 98.5°F | Resp 18 | Ht 70.0 in | Wt 175.8 lb

## 2013-08-16 DIAGNOSIS — G620 Drug-induced polyneuropathy: Secondary | ICD-10-CM

## 2013-08-16 DIAGNOSIS — C20 Malignant neoplasm of rectum: Secondary | ICD-10-CM

## 2013-08-16 DIAGNOSIS — R748 Abnormal levels of other serum enzymes: Secondary | ICD-10-CM

## 2013-08-16 DIAGNOSIS — C787 Secondary malignant neoplasm of liver and intrahepatic bile duct: Secondary | ICD-10-CM

## 2013-08-16 DIAGNOSIS — K625 Hemorrhage of anus and rectum: Secondary | ICD-10-CM

## 2013-08-16 LAB — COMPREHENSIVE METABOLIC PANEL (CC13)
ALT: 38 U/L (ref 0–55)
AST: 26 U/L (ref 5–34)
Albumin: 3.4 g/dL — ABNORMAL LOW (ref 3.5–5.0)
Alkaline Phosphatase: 70 U/L (ref 40–150)
Anion Gap: 10 mEq/L (ref 3–11)
BILIRUBIN TOTAL: 0.23 mg/dL (ref 0.20–1.20)
BUN: 11.7 mg/dL (ref 7.0–26.0)
CALCIUM: 9 mg/dL (ref 8.4–10.4)
CHLORIDE: 109 meq/L (ref 98–109)
CO2: 22 mEq/L (ref 22–29)
Creatinine: 0.8 mg/dL (ref 0.7–1.3)
Glucose: 99 mg/dl (ref 70–140)
Potassium: 3.7 mEq/L (ref 3.5–5.1)
Sodium: 141 mEq/L (ref 136–145)
Total Protein: 6.1 g/dL — ABNORMAL LOW (ref 6.4–8.3)

## 2013-08-16 LAB — CBC WITH DIFFERENTIAL/PLATELET
BASO%: 0.7 % (ref 0.0–2.0)
Basophils Absolute: 0 10*3/uL (ref 0.0–0.1)
EOS%: 0.7 % (ref 0.0–7.0)
Eosinophils Absolute: 0 10*3/uL (ref 0.0–0.5)
HCT: 35 % — ABNORMAL LOW (ref 38.4–49.9)
HGB: 11.4 g/dL — ABNORMAL LOW (ref 13.0–17.1)
LYMPH%: 6.9 % — ABNORMAL LOW (ref 14.0–49.0)
MCH: 30.7 pg (ref 27.2–33.4)
MCHC: 32.5 g/dL (ref 32.0–36.0)
MCV: 94.5 fL (ref 79.3–98.0)
MONO#: 0.5 10*3/uL (ref 0.1–0.9)
MONO%: 8.2 % (ref 0.0–14.0)
NEUT%: 83.5 % — ABNORMAL HIGH (ref 39.0–75.0)
NEUTROS ABS: 4.8 10*3/uL (ref 1.5–6.5)
Platelets: 125 10*3/uL — ABNORMAL LOW (ref 140–400)
RBC: 3.7 10*6/uL — AB (ref 4.20–5.82)
RDW: 18.3 % — AB (ref 11.0–14.6)
WBC: 5.8 10*3/uL (ref 4.0–10.3)
lymph#: 0.4 10*3/uL — ABNORMAL LOW (ref 0.9–3.3)

## 2013-08-16 LAB — MAGNESIUM (CC13): Magnesium: 2.2 mg/dl (ref 1.5–2.5)

## 2013-08-16 LAB — RESEARCH LABS

## 2013-08-16 LAB — CEA: CEA: 1.9 ng/mL (ref 0.0–5.0)

## 2013-08-16 NOTE — Telephone Encounter (Signed)
gv pt appt schedule for june.  °

## 2013-08-16 NOTE — Progress Notes (Signed)
[place] OFFICE PROGRESS NOTE   Diagnosis: Rectal cancer  INTERVAL HISTORY:   Mr. Vestal returns as scheduled. He completed another treatment with FOLFIRI/Avastin 08/02/2013. He complains of increased malaise following this cycle of chemotherapy. He had nausea, but no emesis. He continues to have irregular bowel habits. Mild nose bleeding. Stable neuropathy symptoms. He would like to delay the next cycle of chemotherapy.  Objective:  Vital signs in last 24 hours:  Blood pressure 130/73, pulse 71, temperature 98.5 F (36.9 C), temperature source Oral, resp. rate 18, height 5\' 10"  (1.778 m), weight 175 lb 12.8 oz (79.742 kg).    HEENT: No thrush or ulcers Resp: Lungs clear bilaterally Cardio: Regular rate and rhythm GI: No hepatosplenomegaly, nontender, no mass Vascular: No leg edema  Skin: Mild hyperpigmentation over the hands   Portacath/PICC-without erythema  Lab Results:  Lab Results  Component Value Date   WBC 5.8 08/16/2013   HGB 11.4* 08/16/2013   HCT 35.0* 08/16/2013   MCV 94.5 08/16/2013   PLT 125* 08/16/2013   NEUTROABS 4.8 08/16/2013     Lab Results  Component Value Date   CEA 1.8 06/21/2013    Imaging: CT of the abdomen and pelvis 08/14/2013, compared to 06/20/2013-decrease in the size of hepatic metastases, no evidence of disease progression, 4 mm cavitary nodule in the left lower lobe-slightly smaller Medications: I have reviewed the patient's current medications.  Assessment/Plan: 1. Rectal cancer. Partially obstructing mass noted 1-2 cm from the anal verge on a colonoscopy 03/06/2012. Endoscopic ultrasound 03/14/2012 with a 7.5 mm thick, 3.2 cm wide hypoechoic, irregularly bordered mass that clearly passed into and through the muscularis propria layer of the distal rectal wall (uT3); 3 small (largest 7 mm) perirectal lymph nodes. The lymph nodes were all round, discrete, hypoechoic, homogenous; suspicious for malignant involvement (uN1).  He  began radiation and concurrent Xeloda chemotherapy on 03/20/2012, completed 04/27/2012.  Low anterior resection/coloanal anastomosis and diverting ileostomy 06/29/2012 with the final pathology revealing a T2N0 tumor with extensive fibrosis and negative margins.  Cycle 1 of adjuvant CAPOX 07/19/2012. Cycle 5 of adjuvant CAPOX 10/11/2012.  CEA 2.5 on 11/14/2012.  CEA 14.6 03/08/2013.  Restaging CT evaluation 03/08/2013 with a 4 mm pulmonary nodule left lung base not identified on comparison exam; new liver lesions including a 29 x 26 mm irregular peripheral enhancing rounded lesion in the dome of the right hepatic lobe, a less well-defined new subcapsular lesion in the lateral right hepatic lobe measuring 12 mm, a new subcapsular lesion in the anterior right hepatic lobe adjacent to the gallbladder fossa measuring 10 mm; a rounded low-density lesion in the inferior right hepatic lobe measuring 10 mm compared to 7 mm on the prior study (radiologist commented this may represent an enlarged cyst).  MRI of the abdomen 04/12/2013 confirmed multiple T2 hyperintense metastatic lesions throughout the right liver  Initiation of FOLFIRI/Avastin with genotype based irinotecan dosing per the [facility] study 04/05/2013.  Restaging CT evaluation on 06/20/2013 (after 2 cycles/4 treatments) showed improvement in the liver metastases and stable size of a left lower lobe pulmonary nodule now with central cavitation.  Continuation of FOLFIRI/Avastin. Restaging CT evaluation 08/14/2013-decrease in the size of liver metastases, slight decrease in the size of a cavitary left lower lobe nodule, no evidence of disease progression 2. Irregular bowel habits/rectal bleeding secondary to #1. 3. Mild elevation of the liver enzymes. Question secondary to hepatic steatosis. 4. Indeterminate 8 mm posterior right liver lesion on the staging CT 03/06/2012. 5.  Mildly elevated CEA at 5.7 on 03/06/2012. 6. History of radiation erythema at the  groin and perineum 7. 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 No clinical evidence of thrombophlebitis or extravasation. 8. Delayed nausea following chemotherapy-Decadron prophylaxis was added with cycle 3 CAPOX-improved. 9. Ileostomy takedown 12/07/2012. 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 11. Port-A-Cath placement 12/31/204 12. Severe neutropenia secondary to chemotherapy following cycle 1 of FOLFIRI, chemotherapy was dose reduced and he received Neulasta with day 15 cycle 1  13. Nausea and vomiting following cycle 1 of FOLFIRI-the antiemetic regimen was adjusted with day 15 cycle 1.   Disposition: He has completed 4 cycles of FOLFIRI/Avastin on the [facility] irinotecan dosing study. He has developed increased malaise. We will hold the next cycle of chemotherapy for one week. In the interim I will discuss his case with Dr. Altamease Oiler to consider the indication for a partial hepatectomy and lung metastatectomy procedures.  He will return for an office visit 08/23/2013.  Ladell Pier, MD  08/16/2013  10:48 AM

## 2013-08-16 NOTE — Progress Notes (Signed)
08/16/2013 Patient in to clinic this morning for evaluation at the end of treatment cycle 4. CT scan results were reviewed by Dr. Benay Spice, noting continued partial response to treatment. Patient reports more prolonged fatigue affecting ADLs lasting for about 10 days after treatment, before returning to baseline grade 1 fatigue. Patient reports taking Colace regularly, except when he is having diarrhea. Diarrhea occurred intermittently during the past cycle, up to six stools greater than baseline (grade 2), though patient reports that rectal urgency continues to be a problem during episodes of diarrhea. Patient reports an increased number of nosebleeds daily, though easily stopped and not requiring medical intervention. Patient reports not feeling up to receiving treatment today due to prolonged recovery required for this past cycle. Dr. Benay Spice also plans to discuss the patient's case with Dr. Altamease Oiler, with consideration to proceeding with resection of liver, and possibly lung, metastatic lesion. Accordingly, plans made to schedule evaluation and treatment for next week, pending further discussion regarding his treatment plan. Cindy S. Brigitte Pulse BSN, RN, [place] 08/16/2013 11:54 AM

## 2013-08-17 ENCOUNTER — Telehealth: Payer: Self-pay | Admitting: *Deleted

## 2013-08-17 NOTE — Telephone Encounter (Signed)
Called pt: Dr. Benay Spice discussed case with Dr. Altamease Oiler: Recommendation is to stop chemo, proceed with surgery. Case will be discussed at GI conference, referral to Dr. Barry Dienes will be made then. Pt made aware, he requests to keep office visit as scheduled.

## 2013-08-18 ENCOUNTER — Encounter: Payer: Self-pay | Admitting: Gastroenterology

## 2013-08-20 DIAGNOSIS — R911 Solitary pulmonary nodule: Secondary | ICD-10-CM

## 2013-08-20 HISTORY — DX: Solitary pulmonary nodule: R91.1

## 2013-08-22 ENCOUNTER — Telehealth: Payer: Self-pay | Admitting: *Deleted

## 2013-08-22 NOTE — Telephone Encounter (Signed)
Patient informed of appointment with Dr. Barry Dienes on 08/28/13.  He knows to expect appointment with Dr. Servando Snare soon.  He verbalized comprehension and was without questions.

## 2013-08-23 ENCOUNTER — Encounter: Payer: BC Managed Care – PPO | Admitting: *Deleted

## 2013-08-23 ENCOUNTER — Ambulatory Visit: Payer: BC Managed Care – PPO

## 2013-08-23 ENCOUNTER — Ambulatory Visit (HOSPITAL_BASED_OUTPATIENT_CLINIC_OR_DEPARTMENT_OTHER): Payer: BC Managed Care – PPO | Admitting: Oncology

## 2013-08-23 ENCOUNTER — Other Ambulatory Visit: Payer: BC Managed Care – PPO

## 2013-08-23 ENCOUNTER — Telehealth: Payer: Self-pay | Admitting: Oncology

## 2013-08-23 VITALS — BP 134/84 | HR 65 | Temp 97.7°F | Resp 18 | Ht 70.0 in | Wt 178.2 lb

## 2013-08-23 DIAGNOSIS — C787 Secondary malignant neoplasm of liver and intrahepatic bile duct: Secondary | ICD-10-CM

## 2013-08-23 DIAGNOSIS — D702 Other drug-induced agranulocytosis: Secondary | ICD-10-CM

## 2013-08-23 DIAGNOSIS — C20 Malignant neoplasm of rectum: Secondary | ICD-10-CM

## 2013-08-23 DIAGNOSIS — T451X5A Adverse effect of antineoplastic and immunosuppressive drugs, initial encounter: Secondary | ICD-10-CM

## 2013-08-23 NOTE — Telephone Encounter (Signed)
gv adn printed appt sched and avs for pt for June °

## 2013-08-23 NOTE — Progress Notes (Signed)
Met with patient during clinic visit.  He is aware of his upcoming appointments with Dr. Barry Dienes and Dr. Servando Snare.  He understands to call with any questions or concerns.  Will continue to follow as needed.

## 2013-08-24 NOTE — Progress Notes (Signed)
[place] OFFICE PROGRESS NOTE   Diagnosis: Rectal cancer  INTERVAL HISTORY:   Mr. Grabe returns as scheduled. He reports an improved energy level compared to last week. No new complaint.  Objective:  Vital signs in last 24 hours:  Blood pressure 134/84, pulse 65, temperature 97.7 F (36.5 C), temperature source Oral, resp. rate 18, height 5\' 10"  (1.778 m), weight 178 lb 3.2 oz (80.831 kg).    HEENT: Neck without mass Lymphatics: No cervical, supra-clavicular, axillary, or inguinal nodes Resp: Lungs clear bilaterally Cardio: Regular rate and rhythm GI: No hepatomegaly, nontender, no mass Vascular: No leg edema   Portacath/PICC-without erythema  Lab Results:  Lab Results  Component Value Date   WBC 5.8 08/16/2013   HGB 11.4* 08/16/2013   HCT 35.0* 08/16/2013   MCV 94.5 08/16/2013   PLT 125* 08/16/2013   NEUTROABS 4.8 08/16/2013     Lab Results  Component Value Date   CEA 1.9 08/16/2013    Medications: I have reviewed the patient's current medications.  Assessment/Plan: 1. Rectal cancer. Partially obstructing mass noted 1-2 cm from the anal verge on a colonoscopy 03/06/2012. Endoscopic ultrasound 03/14/2012 with a 7.5 mm thick, 3.2 cm wide hypoechoic, irregularly bordered mass that clearly passed into and through the muscularis propria layer of the distal rectal wall (uT3); 3 small (largest 7 mm) perirectal lymph nodes. The lymph nodes were all round, discrete, hypoechoic, homogenous; suspicious for malignant involvement (uN1).  He began radiation and concurrent Xeloda chemotherapy on 03/20/2012, completed 04/27/2012.  Low anterior resection/coloanal anastomosis and diverting ileostomy 06/29/2012 with the final pathology revealing a T2N0 tumor with extensive fibrosis and negative margins.  Cycle 1 of adjuvant CAPOX 07/19/2012. Cycle 5 of adjuvant CAPOX 10/11/2012.  CEA 2.5 on 11/14/2012.  CEA 14.6 03/08/2013.  Restaging CT evaluation 03/08/2013 with a 4  mm pulmonary nodule left lung base not identified on comparison exam; new liver lesions including a 29 x 26 mm irregular peripheral enhancing rounded lesion in the dome of the right hepatic lobe, a less well-defined new subcapsular lesion in the lateral right hepatic lobe measuring 12 mm, a new subcapsular lesion in the anterior right hepatic lobe adjacent to the gallbladder fossa measuring 10 mm; a rounded low-density lesion in the inferior right hepatic lobe measuring 10 mm compared to 7 mm on the prior study (radiologist commented this may represent an enlarged cyst).  MRI of the abdomen 04/12/2013 confirmed multiple T2 hyperintense metastatic lesions throughout the right liver  Initiation of FOLFIRI/Avastin with genotype based irinotecan dosing per the [facility] study 04/05/2013.  Restaging CT evaluation on 06/20/2013 (after 2 cycles/4 treatments) showed improvement in the liver metastases and stable size of a left lower lobe pulmonary nodule now with central cavitation.  Continuation of FOLFIRI/Avastin.  Restaging CT evaluation 08/14/2013-decrease in the size of liver metastases, slight decrease in the size of a cavitary left lower lobe nodule, no evidence of disease progression 2. Irregular bowel habits/rectal bleeding secondary to #1. 3. Mild elevation of the liver enzymes. Question secondary to hepatic steatosis. 4. Indeterminate 8 mm posterior right liver lesion on the staging CT 03/06/2012. 5. Mildly elevated CEA at 5.7 on 03/06/2012. 6. History of radiation erythema at the groin and perineum 7. 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 No clinical evidence of thrombophlebitis or extravasation. 8. Delayed nausea following chemotherapy-Decadron prophylaxis was added with cycle 3 CAPOX-improved. 9. Ileostomy takedown 12/07/2012. 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 11. Port-A-Cath placement 12/31/204  12. Severe  neutropenia secondary to chemotherapy following cycle 1 of FOLFIRI, chemotherapy was dose reduced and he received Neulasta with day 15 cycle 1  13. Nausea and vomiting following cycle 1 of FOLFIRI-the antiemetic regimen was adjusted with day 15 cycle 1.   Disposition:  His case was presented at the GI tumor conference on 08/15/2013. He is felt to be a candidate for a right hepatectomy and lung resection. I discussed the case with Dr. Altamease Oiler. She agrees and he will be taken off of the [facility] study. We will arrange for an appointment with Dr. Barry Dienes to discuss the partial hepatectomy. He will be referred to Dr. Servando Snare to consider resection of the left lung lesion. He may also be a candidate for SRS treatment to the small lung lesion.  Mr. Holloway will return for an office visit and Port-A-Cath flush 09/18/2013. Chemotherapy will remain on hold.  Ladell Pier, MD  08/24/2013  8:20 AM

## 2013-08-24 NOTE — Progress Notes (Signed)
08/23/2013 Patient seen by Dr. Benay Spice this morning to discuss further surgical treatment plans. Following discussion with Dr. Altamease Oiler last week, decision made by Dr. Benay Spice to discontinue protocol therapy in order to proceed with plans for liver resection. Patient is in agreement with this plan. No labwork obtained at today's visit, per patient and provider requests, therefore values obtained at last week's visit, in anticipation of Cycle 5 treatment (held), will be used as Treatment Discontinuation values. This was confirmed with Franki Cabot, [facility] study coordinator for [employer]. Patient has ongoing intermittent insomnia and anxiety. Symptoms of perineal skin burning associated with diarrhea have not recurred, since diarrhea has resolved as of 08/16/2013. Rectal bleeding has not been noted since the beginning of Cycle 4 treatment. Patient is aware that he will continue in long-term follow up at this time. While most records may be obtained via the EMR, patient is aware that he may be contacted by phone for further information during follow-up, if needed. Thanked patient for his willingness to participate in this research study. Cindy S. Brigitte Pulse BSN, RN, [place] 08/24/2013 8:44 AM

## 2013-08-28 ENCOUNTER — Encounter (INDEPENDENT_AMBULATORY_CARE_PROVIDER_SITE_OTHER): Payer: Self-pay | Admitting: General Surgery

## 2013-08-28 ENCOUNTER — Ambulatory Visit (INDEPENDENT_AMBULATORY_CARE_PROVIDER_SITE_OTHER): Payer: BC Managed Care – PPO | Admitting: General Surgery

## 2013-08-28 VITALS — BP 125/75 | HR 75 | Temp 98.4°F | Resp 16 | Ht 70.0 in | Wt 179.0 lb

## 2013-08-28 DIAGNOSIS — C787 Secondary malignant neoplasm of liver and intrahepatic bile duct: Secondary | ICD-10-CM | POA: Insufficient documentation

## 2013-08-28 NOTE — Addendum Note (Signed)
Addended by: Stark Klein on: 08/28/2013 04:34 PM   Modules accepted: Level of Service

## 2013-08-28 NOTE — Patient Instructions (Signed)
Our plan will be a diagnostic laparoscopy with liver ultrasound to check for any other signs of cancer in your abdomen or the left side of your liver.  If we do not find anything else, we will make an open incision and plan to take the right side of the liver.    Whatever the operative findings, I will discuss the case with your family after we are done in the operating room.  I will talk to you in the next few days when you are more awake.  I will see you in the hospital every weekday that I am not out of town.  My partners help see patients on the weekends and if I am out of town.    WHAT HAPPENS AFTER SURGERY: After surgery, you will go first to the recovery room, then to the ICU for careful monitoring.  It is important that I am able to know your vital signs and urine output to see how you are doing.  Depending on many factors, you may be in the ICU overnight, or for several days.  You will have many tubes and lines in place which is standard for this operation.  You will have several IVs, including possibly a central line in your chest or neck.  You will likely have an arterial line in your wrist.  You may have a tube in your nose for 1-2 days in order to suction out the stomach and liver secretions to help the surgical connections heal.  YOU WILL NOT BE ABLE TO EAT FOR AT LEAST 1-2 DAYS AFTER SURGERY.  You will have a catheter in your bladder.  On your abdomen, you will have several surgical drains and possibly a pain pump with numbing medicine.  You will have compression stockings on your legs to decrease the risk of blood clots.    Sometimes anesthesia makes a decision that you may need to be left on the breathing machine for a period after surgery.  This may be based on your overall health status, or events during surgery.    We will address your pain in several ways.  We will use an IV pain pump called a "PCA," or Patient Controlled Analgesia.  This allows you to press a button and immediately  receive a dose of pain medication without waiting for a nurse.  We also use IV Tylenol and sometimes IV Toradol which is similar to ibuprofen.  You may also have a pump with numbing medicine delivered directly to your incision.  I use doses and medications that work for the majority of people, but you may need an adjustment to the dose or type of medicine if your pain is not adequately controlled.  Your throat may be sore, in which case you may need a throat spray or lozenges.    We will ask you to get out of bed the day after surgery in order to maximize your chances of not having complications.  Your risk of pneumonia and blood clots is lower with walking and sitting in the chair.  We will also ask you to perform breathing exercises.  We will also ask to you walk in your room and in the halls for the above reasons, but also in order for you to keep up your strength.    EATING: We will usually start you on clear liquids in around 2 days if your bowel function seems to have returned.  We advance your diet slowly to make sure you are tolerating  each step.  All patients do not have a normal appetite when they go home and usually have to take 2-4 cans of nutritional supplement per day while this is improving.  Most patients also find that their taste buds do not seem the same right after surgery, and this can continue into the time of possible post operative chemotherapy and radiation.  Some patients develop diabetes and will need assistance from a primary care doctor for medication.     GOING HOME! Usually you are able to go home in 4-7 days, depending on whether or not complications happen and what is going on with your overall health status.   If you have more health problems or if you have limited help at home, the therapists and nurses may recommend a temporary rehab or nursing facility to help you get back on your feet before you go home.  These decisions would be made while you are in the hospital with  the assistance of a social worker or case manager.    Please bring all insurance/disability forms to our office for the staff to fill out   POSSIBLE COMPLICATIONS This is a very extensive operation and includes complications listed below: Bleeding Infection and possible wound complications such as hernia Damage to adjacent structures Leak of bile from the surface of the liver Possible need for other procedures, such as abscess drains in radiology or endoscopy.   Possible prolonged hospital stay MOST PATIENTS' ENERGY LEVEL IS NOT BACK TO NORMAL FOR AT LEAST 4-6 MONTHS.  OLDER PATIENTS MAY FEEL WEAK FOR LONGER PERIODS OF TIME.   Difficulty with eating or post operative nausea Possible early recurrence of cancer Possible complications of your medical problems such as heart disease or arrhythmias. Death (less than 2%)  All possible complications are not listed, just the most common.    FURTHER INFORMATION? Please ask questions if you find something that we did not discuss in the office and would like more information.  If you would like another appointment if you have many questions or if your family members would like to come as well, please contact the office.     IF YOU ARE TAKING ASPIRIN, PLAVIX, COUMADIN, OR OTHER BLOOD THINNERS, LET us KNOW IMMEDIATELY SO WE CAN CONTACT YOUR PRESCRIBING HEALTH CARE PROVIDER TO HOLD THE MEDICATION FOR 5-7 DAYS BEFORE SURGERY

## 2013-08-28 NOTE — Assessment & Plan Note (Signed)
Patient appears to have right-sided only disease.  He is a good candidate for a right hepatectomy. His left lobe appears to be adequately sized to perform surgery without a portal vein embolization.  He has an isolated left lung lesion that Dr. Servando Snare of thoracic surgery is going to consult for.  Unless Dr. Servando Snare is going to be doing a wedge resection, I do not think that I would do these at the same time.  We'll discuss this following his consult.  I am going to set him up for a diagnostic laparoscopy, liver ultrasound, and partial hepatectomy.  I discussed the risk of surgery with the patient.  The procedure was discussed with the patient.  The alternatives were discussed, including ablation, chemotherapy, Y 90 infusion, and doing nothing.  The patient elects to undergo partial hepatectomy.  The procedure was described to the patient including the incision, surgical technique, goals of surgery, post op period and recovery time.  We discussed lifting and driving restrictions as well as time off work.  The complications were described to the patient.  The complications below were discussed, as well as the fact that unpredicted complications could occur.     Bleeding, possibly requiring a blood transfusion. Infection and possible wound complications such as hernia Possible abortion of the operation due to cirrhosis. Damage to adjacent structures Leak of bile from the surface of the liver Possible need for other procedures, such as abscess drains in radiology or endoscopy.   Possible prolonged hospital stay Possible need for ICU stay including time on the ventilator. Decreased energy level.    Possible early recurrence of cancer Possible complications of your medical problems such as heart disease or arrhythmias. Death (less than 2%)

## 2013-08-28 NOTE — Progress Notes (Signed)
Chief Complaint  Patient presents with  . spots on liver    HISTORY: Patient is a 43 year old male who was diagnosed with rectal cancer in December of 2013. He is sent by Dr. Benay Spice for request for consultation regarding his liver metastases. He primarily had neoadjuvant chemoradiation followed by a laparoscopic low anterior resection with coloanal anastomosis by Dr. Marcello Moores. He had an ileostomy at that time and underwent CAPOX. He had restaging in December 2014 for rising CEA and had a left lung base nodule and several right-sided liver lesions. He has undergone several months now of FOLFIRI/Avastin. His last Avastin was on May 14. I am requested to consider him for surgical resection of his liver metastases.  He feels well overall.     Past Medical History  Diagnosis Date  . Chicken pox as a child    ?  . Hernia 6 months old  . Allergic state 01/19/2012  . Reflux 01/19/2012  . Elevated liver function tests 01/19/2012  . Overweight 01/19/2012  . Preventative health care 01/19/2012  . Sun-damaged skin 01/19/2012  . Migraine headache as a child  . Hx of migraines   . Radiation 03/20/12-04/27/12    Pelvis 50.4 gray Rectal cancer  . Anxiety   . Fatty liver   . 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 Cancer 03/06/12    rectal bx=Adenocarcinoma  PT HAD RADIATION , CHEMO SURGERY  . Numbness     TOES OF BOTH FEET.  . Pain     LEFT HEEL PAIN -SEVERE ESPECIALLY AFTER SITTING OR LYING DOWN - DIFFICULT TO GET OUT OF BED AND WALK IN THE MORNINGS BECAUSE OF HEEL PAIN  . Anemia 12/31/2012  . Dysrhythmia as child    HX OF IRREGULAR HB AT TIME OF TONSILLECTOMY - SURGERY WAS NOT DONE-CAN'T REMEMBER ANY OTHER DETAILS- NEVER HAD THE SURGERY.  . 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 Lung abnormality 2015    Past Surgical History  Procedure Laterality Date  . Rectal biopsy  03/06/12    distal mass1-2cm from anal verge=Adenocarcinoma   . Eus  03/14/2012    Procedure: LOWER ENDOSCOPIC ULTRASOUND (EUS);  Surgeon: Milus Banister, MD;  Location: [facility];  Service: Endoscopy;  Laterality: N/A;  . Hernia repair  31 months old    right inguinal repair  . Cyst excision      L EAR AREA  . Laparoscopic low anterior resection N/A 06/29/2012    Procedure: LAPAROSCOPIC LOW ANTERIOR RESECTION, mobilization splenic flexure,coloanal anastomosis,diverting ileostomy;  Surgeon: Leighton Ruff, MD;  Location: [facility];  Service: General;  Laterality: N/A;  . Ileostomy closure N/A 12/07/2012    Procedure: ILEOSTOMY TAKEDOWN;  Surgeon: Leighton Ruff, MD;  Location: [facility];  Service: General;  Laterality: N/A;  . Portacath placement Left 03/21/2013    Procedure: INSERTION PORT-A-CATH;  Surgeon: Leighton Ruff, MD;  Location: [facility];  Service: General;  Laterality: Left;    Current Outpatient Prescriptions  Medication Sig Dispense Refill  . ALPRAZolam (XANAX) 0.25 MG tablet Take 1 tablet (0.25 mg total) by mouth 2 (two) times daily as needed for sleep or anxiety.  60 tablet  1  . B Complex-C (B-COMPLEX WITH VITAMIN C) tablet Take 1 tablet by mouth daily.      Marland Kitchen docusate sodium (COLACE) 100 MG capsule Take 100 mg by mouth daily. Occasionally bid      . lidocaine-prilocaine (EMLA) cream Apply 1 application topically  as needed. Apply to [facility] site 1-2 hours prior to stick and cover with plastic wrap  30 g  prn  . 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      . Multiple Vitamin (MULTIVITAMIN) tablet Take 1 tablet by mouth daily. Men's one-a-day formulation.      . potassium chloride SA (K-DUR,KLOR-CON) 20 MEQ tablet Take 1 tablet (20 mEq total) by mouth daily.  30 tablet  2  . Probiotic Product (PROBIOTIC DAILY PO) Take 1 tablet by mouth daily.      . prochlorperazine (COMPAZINE) 10 MG tablet Take 10 mg by mouth every 6 (six) hours as needed for nausea or vomiting.       No  current facility-administered medications for this visit.   Facility-Administered Medications Ordered in Other Visits  Medication Dose Route Frequency Provider Last Rate Last Dose  . sodium chloride 0.9 % injection 10 mL  10 mL Intracatheter PRN Ladell Pier, MD   10 mL at 07/07/13 1326     Allergies  Allergen Reactions  . Penicillins Rash    Does not recall     Family History  Problem Relation Age of Onset  . Adopted: Yes  . Cancer Father   . Heart disease Father   . Colon cancer Neg Hx      History   Social History  . Marital Status: Married    Spouse Name: N/A    Number of Children: 1  . Years of Education: N/A   Occupational History  . Scultor/Professor     [employer]   Social History Main Topics  . Smoking status: Never Smoker   . Smokeless tobacco: Never Used  . Alcohol Use: Yes     Comment: RARE   . Drug Use: No     Comment: marijuana  . Sexual Activity: Yes    Partners: Female   Other Topics Concern  . None   Social History Narrative   Married to wife, [name]   Has one 52 year old child   Occupation: Emergency planning/management officer at [employer]: 12 point review of systems negative other than HPI and PMH  EXAM: Filed Vitals:   08/28/13 1510  BP: 125/75  Pulse: 75  Temp: 98.4 F (36.9 C)  Resp: 16    Wt Readings from Last 3 Encounters:  08/28/13 179 lb (81.194 kg)  08/23/13 178 lb 3.2 oz (80.831 kg)  08/16/13 175 lb 12.8 oz (79.742 kg)     Gen:  No acute distress.  Well nourished and well groomed.   Neurological: Alert and oriented to person, place, and time. Coordination normal.  Head: Normocephalic and atraumatic.  Eyes: Conjunctivae are normal. Pupils are equal, round, and reactive to light. No scleral icterus.  Neck: Normal range of motion. Neck supple. No tracheal deviation or thyromegaly present.  Cardiovascular: Normal rate, regular rhythm, normal heart sounds and intact distal pulses.  Exam  reveals no gallop and no friction rub.  No murmur heard. Respiratory: Effort normal.  No respiratory distress. No chest wall tenderness. Breath sounds normal.  No wheezes, rales or rhonchi.  GI: Soft. Bowel sounds are normal. The abdomen is soft and nontender.  There is no rebound and no guarding. Pt has scars from lap LAR and ileostomy Musculoskeletal: Normal range of motion. Extremities are nontender.  Lymphadenopathy: No cervical, preauricular, postauricular or  axillary adenopathy is present Skin: Skin is warm and dry. No rash noted. No diaphoresis. No erythema. No pallor. No clubbing, cyanosis, or edema.   Psychiatric: Normal mood and affect. Behavior is normal. Judgment and thought content normal.    LABORATORY RESULTS: Available labs are reviewed   Recent Results (from the past 2160 hour(s))  MAGNESIUM (CC13)     Status: None   Collection Time    06/07/13  9:45 AM      Result Value Ref Range   Magnesium 2.4  1.5 - 2.5 mg/dl  COMPREHENSIVE METABOLIC PANEL (RA07)     Status: None   Collection Time    06/07/13  9:45 AM      Result Value Ref Range   Sodium 144  136 - 145 mEq/L   Potassium 3.7  3.5 - 5.1 mEq/L   Chloride 109  98 - 109 mEq/L   CO2 26  22 - 29 mEq/L   Glucose 94  70 - 140 mg/dl   BUN 12.4  7.0 - 26.0 mg/dL   Creatinine 1.0  0.7 - 1.3 mg/dL   Total Bilirubin 0.32  0.20 - 1.20 mg/dL   Alkaline Phosphatase 81  40 - 150 U/L   AST 31  5 - 34 U/L   ALT 50  0 - 55 U/L   Total Protein 6.5  6.4 - 8.3 g/dL   Albumin 3.6  3.5 - 5.0 g/dL   Calcium 9.2  8.4 - 10.4 mg/dL   Anion Gap 10  3 - 11 mEq/L  CBC WITH DIFFERENTIAL     Status: Abnormal   Collection Time    06/07/13  9:45 AM      Result Value Ref Range   WBC 5.3  4.0 - 10.3 10e3/uL   NEUT# 4.3  1.5 - 6.5 10e3/uL   HGB 12.6 (*) 13.0 - 17.1 g/dL   HCT 38.2 (*) 38.4 - 49.9 %   Platelets 116 (*) 140 - 400 10e3/uL   MCV 91.3  79.3 - 98.0 fL   MCH 30.2  27.2 - 33.4 pg   MCHC 33.1  32.0 - 36.0 g/dL   RBC 4.18 (*)  4.20 - 5.82 10e6/uL   RDW 16.0 (*) 11.0 - 14.6 %   lymph# 0.5 (*) 0.9 - 3.3 10e3/uL   MONO# 0.5  0.1 - 0.9 10e3/uL   Eosinophils Absolute 0.0  0.0 - 0.5 10e3/uL   Basophils Absolute 0.0  0.0 - 0.1 10e3/uL   NEUT% 81.3 (*) 39.0 - 75.0 %   LYMPH% 8.9 (*) 14.0 - 49.0 %   MONO% 8.7  0.0 - 14.0 %   EOS% 0.7  0.0 - 7.0 %   BASO% 0.4  0.0 - 2.0 %  UA PROTEIN, DIPSTICK - [facility]     Status: None   Collection Time    06/07/13  9:45 AM      Result Value Ref Range   Protein, ur Negative  Negative- <30 mg/dL  CBC WITH DIFFERENTIAL     Status: Abnormal   Collection Time    06/21/13  9:30 AM      Result Value Ref Range   WBC 4.8  4.0 - 10.3 10e3/uL   NEUT# 3.7  1.5 - 6.5 10e3/uL   HGB 12.5 (*) 13.0 - 17.1 g/dL   HCT 38.0 (*) 38.4 - 49.9 %   Platelets 109 (*) 140 - 400 10e3/uL   MCV 93.4  79.3 - 98.0 fL   MCH 30.7  27.2 -  33.4 pg   MCHC 32.9  32.0 - 36.0 g/dL   RBC 4.07 (*) 4.20 - 5.82 10e6/uL   RDW 16.2 (*) 11.0 - 14.6 %   lymph# 0.6 (*) 0.9 - 3.3 10e3/uL   MONO# 0.5  0.1 - 0.9 10e3/uL   Eosinophils Absolute 0.0  0.0 - 0.5 10e3/uL   Basophils Absolute 0.0  0.0 - 0.1 10e3/uL   NEUT% 77.1 (*) 39.0 - 75.0 %   LYMPH% 11.9 (*) 14.0 - 49.0 %   MONO% 10.0  0.0 - 14.0 %   EOS% 0.8  0.0 - 7.0 %   BASO% 0.2  0.0 - 2.0 %  COMPREHENSIVE METABOLIC PANEL (TD17)     Status: Abnormal   Collection Time    06/21/13  9:30 AM      Result Value Ref Range   Sodium 144  136 - 145 mEq/L   Potassium 3.4 (*) 3.5 - 5.1 mEq/L   Chloride 108  98 - 109 mEq/L   CO2 24  22 - 29 mEq/L   Glucose 89  70 - 140 mg/dl   BUN 12.9  7.0 - 26.0 mg/dL   Creatinine 0.8  0.7 - 1.3 mg/dL   Total Bilirubin 0.21  0.20 - 1.20 mg/dL   Alkaline Phosphatase 78  40 - 150 U/L   AST 27  5 - 34 U/L   ALT 49  0 - 55 U/L   Total Protein 6.4  6.4 - 8.3 g/dL   Albumin 3.6  3.5 - 5.0 g/dL   Calcium 8.9  8.4 - 10.4 mg/dL   Anion Gap 12 (*) 3 - 11 mEq/L  MAGNESIUM (CC13)     Status: Abnormal   Collection Time    06/21/13  9:30 AM       Result Value Ref Range   Magnesium 2.6 (*) 1.5 - 2.5 mg/dl  CEA     Status: None   Collection Time    06/21/13  9:30 AM      Result Value Ref Range   CEA 1.8  0.0 - 5.0 ng/mL  [facility]     Status: None   Collection Time    06/21/13  9:30 AM      Result Value Ref Range   [facility] Collected.    CBC WITH DIFFERENTIAL     Status: Abnormal   Collection Time    07/05/13 10:25 AM      Result Value Ref Range   WBC 3.4 (*) 4.0 - 10.3 10e3/uL   NEUT# 2.6  1.5 - 6.5 10e3/uL   HGB 12.5 (*) 13.0 - 17.1 g/dL   HCT 37.8 (*) 38.4 - 49.9 %   Platelets 107 (*) 140 - 400 10e3/uL   MCV 93.0  79.3 - 98.0 fL   MCH 30.7  27.2 - 33.4 pg   MCHC 33.0  32.0 - 36.0 g/dL   RBC 4.07 (*) 4.20 - 5.82 10e6/uL   RDW 17.6 (*) 11.0 - 14.6 %   lymph# 0.4 (*) 0.9 - 3.3 10e3/uL   MONO# 0.3  0.1 - 0.9 10e3/uL   Eosinophils Absolute 0.0  0.0 - 0.5 10e3/uL   Basophils Absolute 0.0  0.0 - 0.1 10e3/uL   NEUT% 76.8 (*) 39.0 - 75.0 %   LYMPH% 12.8 (*) 14.0 - 49.0 %   MONO% 9.1  0.0 - 14.0 %   EOS% 0.8  0.0 - 7.0 %   BASO% 0.5  0.0 - 2.0 %  MAGNESIUM (CC13)  Status: None   Collection Time    07/05/13 10:25 AM      Result Value Ref Range   Magnesium 2.3  1.5 - 2.5 mg/dl  COMPREHENSIVE METABOLIC PANEL (VZ56)     Status: Abnormal   Collection Time    07/05/13 10:25 AM      Result Value Ref Range   Sodium 143  136 - 145 mEq/L   Potassium 3.6  3.5 - 5.1 mEq/L   Chloride 111 (*) 98 - 109 mEq/L   CO2 24  22 - 29 mEq/L   Glucose 126  70 - 140 mg/dl   BUN 13.0  7.0 - 26.0 mg/dL   Creatinine 0.9  0.7 - 1.3 mg/dL   Total Bilirubin 0.26  0.20 - 1.20 mg/dL   Alkaline Phosphatase 74  40 - 150 U/L   AST 35 (*) 5 - 34 U/L   ALT 58 (*) 0 - 55 U/L   Total Protein 6.2 (*) 6.4 - 8.3 g/dL   Albumin 3.6  3.5 - 5.0 g/dL   Calcium 8.7  8.4 - 10.4 mg/dL   Anion Gap 8  3 - 11 mEq/L  UA PROTEIN, DIPSTICK - [facility]     Status: None   Collection Time    07/05/13 10:25 AM      Result Value Ref Range   Protein, ur < 30   Negative- <30 mg/dL  CBC WITH DIFFERENTIAL     Status: Abnormal   Collection Time    07/19/13  9:32 AM      Result Value Ref Range   WBC 4.1  4.0 - 10.3 10e3/uL   NEUT# 3.3  1.5 - 6.5 10e3/uL   HGB 11.9 (*) 13.0 - 17.1 g/dL   HCT 36.5 (*) 38.4 - 49.9 %   Platelets 122 (*) 140 - 400 10e3/uL   MCV 93.8  79.3 - 98.0 fL   MCH 30.6  27.2 - 33.4 pg   MCHC 32.6  32.0 - 36.0 g/dL   RBC 3.89 (*) 4.20 - 5.82 10e6/uL   RDW 17.1 (*) 11.0 - 14.6 %   lymph# 0.4 (*) 0.9 - 3.3 10e3/uL   MONO# 0.4  0.1 - 0.9 10e3/uL   Eosinophils Absolute 0.0  0.0 - 0.5 10e3/uL   Basophils Absolute 0.0  0.0 - 0.1 10e3/uL   NEUT% 79.4 (*) 39.0 - 75.0 %   LYMPH% 10.0 (*) 14.0 - 49.0 %   MONO% 9.5  0.0 - 14.0 %   EOS% 0.7  0.0 - 7.0 %   BASO% 0.4  0.0 - 2.0 %  COMPREHENSIVE METABOLIC PANEL (LO75)     Status: Abnormal   Collection Time    07/19/13  9:33 AM      Result Value Ref Range   Sodium 144  136 - 145 mEq/L   Potassium 3.9  3.5 - 5.1 mEq/L   Chloride 109  98 - 109 mEq/L   CO2 24  22 - 29 mEq/L   Glucose 82  70 - 140 mg/dl   BUN 12.6  7.0 - 26.0 mg/dL   Creatinine 0.9  0.7 - 1.3 mg/dL   Total Bilirubin 0.26  0.20 - 1.20 mg/dL   Alkaline Phosphatase 72  40 - 150 U/L   AST 28  5 - 34 U/L   ALT 53  0 - 55 U/L   Total Protein 6.3 (*) 6.4 - 8.3 g/dL   Albumin 3.5  3.5 - 5.0 g/dL   Calcium 9.2  8.4 - 10.4 mg/dL   Anion Gap 10  3 - 11 mEq/L  MAGNESIUM (CC13)     Status: None   Collection Time    07/19/13  9:33 AM      Result Value Ref Range   Magnesium 2.3  1.5 - 2.5 mg/dl  COMPREHENSIVE METABOLIC PANEL (VF64)     Status: Abnormal   Collection Time    08/02/13  9:33 AM      Result Value Ref Range   Sodium 146 (*) 136 - 145 mEq/L   Potassium 4.2  3.5 - 5.1 mEq/L   Chloride 110 (*) 98 - 109 mEq/L   CO2 24  22 - 29 mEq/L   Glucose 82  70 - 140 mg/dl   BUN 12.1  7.0 - 26.0 mg/dL   Creatinine 0.9  0.7 - 1.3 mg/dL   Total Bilirubin 0.22  0.20 - 1.20 mg/dL   Alkaline Phosphatase 67  40 - 150 U/L   AST  31  5 - 34 U/L   ALT 43  0 - 55 U/L   Total Protein 6.0 (*) 6.4 - 8.3 g/dL   Albumin 3.4 (*) 3.5 - 5.0 g/dL   Calcium 9.1  8.4 - 10.4 mg/dL   Anion Gap 12 (*) 3 - 11 mEq/L  MAGNESIUM (CC13)     Status: None   Collection Time    08/02/13  9:33 AM      Result Value Ref Range   Magnesium 2.5  1.5 - 2.5 mg/dl  CBC WITH DIFFERENTIAL     Status: Abnormal   Collection Time    08/02/13  9:34 AM      Result Value Ref Range   WBC 6.2  4.0 - 10.3 10e3/uL   NEUT# 5.1  1.5 - 6.5 10e3/uL   HGB 11.7 (*) 13.0 - 17.1 g/dL   HCT 35.5 (*) 38.4 - 49.9 %   Platelets 98 (*) 140 - 400 10e3/uL   MCV 93.8  79.3 - 98.0 fL   MCH 30.8  27.2 - 33.4 pg   MCHC 32.8  32.0 - 36.0 g/dL   RBC 3.78 (*) 4.20 - 5.82 10e6/uL   RDW 17.7 (*) 11.0 - 14.6 %   lymph# 0.4 (*) 0.9 - 3.3 10e3/uL   MONO# 0.6  0.1 - 0.9 10e3/uL   Eosinophils Absolute 0.0  0.0 - 0.5 10e3/uL   Basophils Absolute 0.1  0.0 - 0.1 10e3/uL   NEUT% 82.7 (*) 39.0 - 75.0 %   LYMPH% 6.8 (*) 14.0 - 49.0 %   MONO% 9.0  0.0 - 14.0 %   EOS% 0.5  0.0 - 7.0 %   BASO% 1.0  0.0 - 2.0 %  UA PROTEIN, DIPSTICK - [facility]     Status: None   Collection Time    08/02/13  9:34 AM      Result Value Ref Range   Protein, ur Negative  Negative- <30 mg/dL  CBC WITH DIFFERENTIAL     Status: Abnormal   Collection Time    08/16/13 10:08 AM      Result Value Ref Range   WBC 5.8  4.0 - 10.3 10e3/uL   NEUT# 4.8  1.5 - 6.5 10e3/uL   HGB 11.4 (*) 13.0 - 17.1 g/dL   HCT 35.0 (*) 38.4 - 49.9 %   Platelets 125 (*) 140 - 400 10e3/uL   MCV 94.5  79.3 - 98.0 fL   MCH 30.7  27.2 - 33.4 pg   MCHC  32.5  32.0 - 36.0 g/dL   RBC 3.70 (*) 4.20 - 5.82 10e6/uL   RDW 18.3 (*) 11.0 - 14.6 %   lymph# 0.4 (*) 0.9 - 3.3 10e3/uL   MONO# 0.5  0.1 - 0.9 10e3/uL   Eosinophils Absolute 0.0  0.0 - 0.5 10e3/uL   Basophils Absolute 0.0  0.0 - 0.1 10e3/uL   NEUT% 83.5 (*) 39.0 - 75.0 %   LYMPH% 6.9 (*) 14.0 - 49.0 %   MONO% 8.2  0.0 - 14.0 %   EOS% 0.7  0.0 - 7.0 %   BASO% 0.7  0.0 - 2.0 %   [facility]     Status: None   Collection Time    08/16/13 10:08 AM      Result Value Ref Range   [facility] Collected.    COMPREHENSIVE METABOLIC PANEL (ZO10)     Status: Abnormal   Collection Time    08/16/13 10:08 AM      Result Value Ref Range   Sodium 141  136 - 145 mEq/L   Potassium 3.7  3.5 - 5.1 mEq/L   Chloride 109  98 - 109 mEq/L   CO2 22  22 - 29 mEq/L   Glucose 99  70 - 140 mg/dl   BUN 11.7  7.0 - 26.0 mg/dL   Creatinine 0.8  0.7 - 1.3 mg/dL   Total Bilirubin 0.23  0.20 - 1.20 mg/dL   Alkaline Phosphatase 70  40 - 150 U/L   AST 26  5 - 34 U/L   ALT 38  0 - 55 U/L   Total Protein 6.1 (*) 6.4 - 8.3 g/dL   Albumin 3.4 (*) 3.5 - 5.0 g/dL   Calcium 9.0  8.4 - 10.4 mg/dL   Anion Gap 10  3 - 11 mEq/L  MAGNESIUM (CC13)     Status: None   Collection Time    08/16/13 10:08 AM      Result Value Ref Range   Magnesium 2.2  1.5 - 2.5 mg/dl  CEA     Status: None   Collection Time    08/16/13 10:09 AM      Result Value Ref Range   CEA 1.9  0.0 - 5.0 ng/mL     RADIOLOGY RESULTS: See E-Chart or I-Site for most recent results.  Images and reports are reviewed.  Ct Chest W Contrast  08/14/2013   CLINICAL DATA:  Restaging rectal cancer. History of colon resection and metastatic disease. Chemotherapy and radiation therapy completed.  EXAM: CT CHEST, ABDOMEN, AND PELVIS WITH CONTRAST  TECHNIQUE: Multidetector CT imaging of the chest, abdomen and pelvis was performed following the standard protocol during bolus administration of intravenous contrast.  CONTRAST:  188mL OMNIPAQUE IOHEXOL 300 MG/ML  SOLN  COMPARISON:  Prior examinations 06/20/2013 (CT) and abdominal MRI 04/11/2013.  FINDINGS: RECIST 1.1  Target Lesions:  1. Lesion in the dome of the right hepatic lobe measures 1.4 cm on image 43 (previously 1.7 cm). 2. Subcapsular lesion in the posterior segment of the right hepatic lobe measures 5 mm on image 49 (previously 6 mm). Non-target Lesions:  1. The previously demonstrated  cavitary left lower lobe pulmonary nodule is subjectively smaller, but measures 4 mm on image 32 (4 mm previously). 2. See below.  CT CHEST FINDINGS  Left subclavian Port-A-Cath tip is in the lower SVC. There are no enlarged mediastinal, hilar or axillary lymph nodes. The thyroid gland and thoracic inlet appear normal.  There is no pleural  or pericardial effusion. 4 mm cavitary nodule in the left lower lobe on image 32 is subjectively slightly smaller. Tiny subpleural left lower lobe nodule on image 36 is now nearly inconspicuous. No new or enlarging pulmonary nodules are identified. There is no confluent airspace opacity or endobronchial lesion.  There are no worrisome osseous findings.  CT ABDOMEN AND PELVIS FINDINGS  The dominant metastasis involving the dome of the right hepatic lobe has slightly decreased in size, measuring 1.4 cm on image 43 (previously 1.7 cm. Cystic lesion inferiorly in the right hepatic lobe measures 11 mm (9 mm previously). This did not appear to reflect a metastasis on MRI. Other small lesions are stable.  The spleen, gallbladder, pancreas, adrenal glands and kidneys appear normal.  Small retroperitoneal lymph nodes are stable. There is no adenopathy, ascites or peritoneal nodularity. There are no significant vascular findings.  There is moderate stool throughout the colon. No focal mucosal lesions are identified. Postsurgical changes within the anterior abdominal wall and presacral fibrotic changes are stable. The bladder, prostate gland and seminal vesicles appear stable.  There are no worrisome osseous findings.  IMPRESSION: 1. Continued response to therapy in hepatic metastatic disease. 2. The cavitary left lower lobe pulmonary nodule also appears subjectively smaller. 3. No disease progression identified. 4. Stable presacral fibrotic changes.   Electronically Signed   By: Camie Patience M.D.   On: 08/14/2013 09:19   Ct Abdomen Pelvis W Contrast  08/14/2013   CLINICAL DATA:   Restaging rectal cancer. History of colon resection and metastatic disease. Chemotherapy and radiation therapy completed.  EXAM: CT CHEST, ABDOMEN, AND PELVIS WITH CONTRAST  TECHNIQUE: Multidetector CT imaging of the chest, abdomen and pelvis was performed following the standard protocol during bolus administration of intravenous contrast.  CONTRAST:  111mL OMNIPAQUE IOHEXOL 300 MG/ML  SOLN  COMPARISON:  Prior examinations 06/20/2013 (CT) and abdominal MRI 04/11/2013.  FINDINGS: RECIST 1.1  Target Lesions:  1. Lesion in the dome of the right hepatic lobe measures 1.4 cm on image 43 (previously 1.7 cm). 2. Subcapsular lesion in the posterior segment of the right hepatic lobe measures 5 mm on image 49 (previously 6 mm). Non-target Lesions:  1. The previously demonstrated cavitary left lower lobe pulmonary nodule is subjectively smaller, but measures 4 mm on image 32 (4 mm previously). 2. See below.  CT CHEST FINDINGS  Left subclavian Port-A-Cath tip is in the lower SVC. There are no enlarged mediastinal, hilar or axillary lymph nodes. The thyroid gland and thoracic inlet appear normal.  There is no pleural or pericardial effusion. 4 mm cavitary nodule in the left lower lobe on image 32 is subjectively slightly smaller. Tiny subpleural left lower lobe nodule on image 36 is now nearly inconspicuous. No new or enlarging pulmonary nodules are identified. There is no confluent airspace opacity or endobronchial lesion.  There are no worrisome osseous findings.  CT ABDOMEN AND PELVIS FINDINGS  The dominant metastasis involving the dome of the right hepatic lobe has slightly decreased in size, measuring 1.4 cm on image 43 (previously 1.7 cm. Cystic lesion inferiorly in the right hepatic lobe measures 11 mm (9 mm previously). This did not appear to reflect a metastasis on MRI. Other small lesions are stable.  The spleen, gallbladder, pancreas, adrenal glands and kidneys appear normal.  Small retroperitoneal lymph nodes are  stable. There is no adenopathy, ascites or peritoneal nodularity. There are no significant vascular findings.  There is moderate stool throughout the colon. No focal mucosal lesions  are identified. Postsurgical changes within the anterior abdominal wall and presacral fibrotic changes are stable. The bladder, prostate gland and seminal vesicles appear stable.  There are no worrisome osseous findings.  IMPRESSION: 1. Continued response to therapy in hepatic metastatic disease. 2. The cavitary left lower lobe pulmonary nodule also appears subjectively smaller. 3. No disease progression identified. 4. Stable presacral fibrotic changes.   Electronically Signed   By: Camie Patience M.D.   On: 08/14/2013 09:19      ASSESSMENT AND PLAN: Rectal cancer, metastatic to liver Patient appears to have right-sided only disease.  He is a good candidate for a right hepatectomy. His left lobe appears to be adequately sized to perform surgery without a portal vein embolization.  He has an isolated left lung lesion that Dr. Servando Snare of thoracic surgery is going to consult for.  Unless Dr. Servando Snare is going to be doing a wedge resection, I do not think that I would do these at the same time.  We'll discuss this following his consult.  I am going to set him up for a diagnostic laparoscopy, liver ultrasound, and partial hepatectomy.  I discussed the risk of surgery with the patient.  The procedure was discussed with the patient.  The alternatives were discussed, including ablation, chemotherapy, Y 90 infusion, and doing nothing.  The patient elects to undergo partial hepatectomy.  The procedure was described to the patient including the incision, surgical technique, goals of surgery, post op period and recovery time.  We discussed lifting and driving restrictions as well as time off work.  The complications were described to the patient.  The complications below were discussed, as well as the fact that unpredicted  complications could occur.     Bleeding, possibly requiring a blood transfusion. Infection and possible wound complications such as hernia Possible abortion of the operation due to cirrhosis. Damage to adjacent structures Leak of bile from the surface of the liver Possible need for other procedures, such as abscess drains in radiology or endoscopy.   Possible prolonged hospital stay Possible need for ICU stay including time on the ventilator. Decreased energy level.    Possible early recurrence of cancer Possible complications of your medical problems such as heart disease or arrhythmias. Death (less than 2%)        Milus Height MD Surgical Oncology, General and [facility]   Visit Diagnoses: 1. Rectal cancer, metastatic to liver     Primary Care Physician: Penni Homans, MD

## 2013-08-30 ENCOUNTER — Institutional Professional Consult (permissible substitution) (INDEPENDENT_AMBULATORY_CARE_PROVIDER_SITE_OTHER): Payer: BC Managed Care – PPO | Admitting: Cardiothoracic Surgery

## 2013-08-30 ENCOUNTER — Encounter: Payer: Self-pay | Admitting: Cardiothoracic Surgery

## 2013-08-30 VITALS — BP 128/83 | HR 79 | Resp 16 | Ht 70.0 in | Wt 178.0 lb

## 2013-08-30 DIAGNOSIS — C19 Malignant neoplasm of rectosigmoid junction: Secondary | ICD-10-CM

## 2013-08-30 DIAGNOSIS — D381 Neoplasm of uncertain behavior of trachea, bronchus and lung: Secondary | ICD-10-CM

## 2013-08-30 NOTE — Progress Notes (Signed)
[address]       [place] [address]             [phone]                    Arkin R Flahive Gove Medical Record #[id number] Date of Birth: 1970/06/14  Referring: Ladell Pier, MD Primary Care: Penni Homans, MD  Chief Complaint:    Chief Complaint  Patient presents with  . Lung Lesion    eval for resection...has had chemo    History of Present Illness:    Jacob [name] 43 y.o. male is seen in the office  Today at the request of Dr. Benay Spice for a 4 mm left lung nodule. He has been treated for rectal cancer, followup scans have noted potential liver metastasis. The patient is being considered for liver resection. His previous history is outlined:   1. Rectal Cancer Partially obstructing mass noted 1-2 cm from the anal verge on a colonoscopy 03/06/2012. Endoscopic ultrasound 03/14/2012 with a 7.5 mm thick, 3.2 cm wide hypoechoic, irregularly bordered mass that clearly passed into and through the muscularis propria layer of the distal rectal wall (uT3); 3 small (largest 7 mm) perirectal lymph nodes. The lymph nodes were all round, discrete, hypoechoic, homogenous; suspicious for malignant involvement (uN1).  He began radiation and concurrent Xeloda chemotherapy on 03/20/2012, completed 04/27/2012.  Low anterior resection/coloanal anastomosis and diverting ileostomy 06/29/2012 with the final pathology revealing a T2N0 tumor with extensive fibrosis and negative margins.  Cycle 1 of adjuvant CAPOX 07/19/2012. Cycle 5 of adjuvant CAPOX 10/11/2012.  CEA 2.5 on 11/14/2012.  CEA 14.6 03/08/2013.  Restaging CT evaluation 03/08/2013 with a 4 mm pulmonary nodule left lung base not identified on comparison exam; new liver lesions including a 29 x 26 mm irregular peripheral enhancing rounded lesion in the dome of the right hepatic lobe, a less well-defined new subcapsular lesion in the lateral right hepatic lobe measuring 12 mm, a new subcapsular lesion in the anterior  right hepatic lobe adjacent to the gallbladder fossa measuring 10 mm; a rounded low-density lesion in the inferior right hepatic lobe measuring 10 mm compared to 7 mm on the prior study (radiologist commented this may represent an enlarged cyst).  MRI of the abdomen 04/12/2013 confirmed multiple T2 hyperintense metastatic lesions throughout the right liver  Initiation of FOLFIRI/Avastin with genotype based irinotecan dosing per the [facility] study 04/05/2013.  Restaging CT evaluation on 06/20/2013 (after 2 cycles/4 treatments) showed improvement in the liver metastases and stable size of a left lower lobe pulmonary nodule now with central cavitation.  Continuation of FOLFIRI/Avastin.  Restaging CT evaluation 08/14/2013-decrease in the size of liver metastases, slight decrease in the size of a cavitary left lower lobe nodule, no evidence of disease progression     Current Activity/ Functional Status:  Patient is independent with mobility/ambulation, transfers, ADL's, IADL's.   Zubrod Score: At the time of surgery this patient's most appropriate activity status/level should be described as: [x]     0    Normal activity, no symptoms []     1    Restricted in physical strenuous activity but ambulatory, able to do out light work []     2    Ambulatory and capable of self care, unable to do work activities, up and about               >50 % of waking hours                              []   3    Only limited self care, in bed greater than 50% of waking hours []     4    Completely disabled, no self care, confined to bed or chair []     5    Moribund   Past Medical History  Diagnosis Date  . Chicken pox as a child    ?  . Hernia 6 months old  . Allergic state 01/19/2012  . Reflux 01/19/2012  . Elevated liver function tests 01/19/2012  . Overweight 01/19/2012  . Preventative health care 01/19/2012  . Sun-damaged skin 01/19/2012  . Migraine headache as a child  . Hx of migraines   . Radiation  03/20/12-04/27/12    Pelvis 50.4 gray Rectal cancer  . Anxiety   . Fatty liver   . 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 Cancer 03/06/12    rectal bx=Adenocarcinoma  PT HAD RADIATION , CHEMO SURGERY  . Numbness     TOES OF BOTH FEET.  . Pain     LEFT HEEL PAIN -SEVERE ESPECIALLY AFTER SITTING OR LYING DOWN - DIFFICULT TO GET OUT OF BED AND WALK IN THE MORNINGS BECAUSE OF HEEL PAIN  . Anemia 12/31/2012  . Dysrhythmia as child    HX OF IRREGULAR HB AT TIME OF TONSILLECTOMY - SURGERY WAS NOT DONE-CAN'T REMEMBER ANY OTHER DETAILS- NEVER HAD THE SURGERY.  . 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 Lung abnormality 2015    Past Surgical History  Procedure Laterality Date  . Rectal biopsy  03/06/12    distal mass1-2cm from anal verge=Adenocarcinoma  . Eus  03/14/2012    Procedure: LOWER ENDOSCOPIC ULTRASOUND (EUS);  Surgeon: Milus Banister, MD;  Location: [facility];  Service: Endoscopy;  Laterality: N/A;  . Hernia repair  48 months old    right inguinal repair  . Cyst excision      L EAR AREA  . Laparoscopic low anterior resection N/A 06/29/2012    Procedure: LAPAROSCOPIC LOW ANTERIOR RESECTION, mobilization splenic flexure,coloanal anastomosis,diverting ileostomy;  Surgeon: Leighton Ruff, MD;  Location: [facility];  Service: General;  Laterality: N/A;  . Ileostomy closure N/A 12/07/2012    Procedure: ILEOSTOMY TAKEDOWN;  Surgeon: Leighton Ruff, MD;  Location: [facility];  Service: General;  Laterality: N/A;  . Portacath placement Left 03/21/2013    Procedure: INSERTION PORT-A-CATH;  Surgeon: Leighton Ruff, MD;  Location: [facility];  Service: General;  Laterality: Left;    Family History  Problem Relation Age of Onset  . Adopted: Yes  . Cancer Father   . Heart disease Father   . Colon cancer Neg Hx       History  Smoking status  . Never Smoker   Smokeless tobacco  . Never Used    History  Alcohol Use  . Yes    Comment:  RARE      Allergies  Allergen Reactions  . Penicillins Rash    Does not recall    Current Outpatient Prescriptions  Medication Sig Dispense Refill  . ALPRAZolam (XANAX) 0.25 MG tablet Take 1 tablet (0.25 mg total) by mouth 2 (two) times daily as needed for sleep or anxiety.  60 tablet  1  . B Complex-C (B-COMPLEX WITH VITAMIN C) tablet Take 1 tablet by mouth daily.      Marland Kitchen docusate sodium (COLACE) 100 MG capsule Take 100 mg by mouth daily. Occasionally bid      . lidocaine-prilocaine (EMLA) cream  Apply 1 application topically as needed. Apply to [facility] site 1-2 hours prior to stick and cover with plastic wrap  30 g  prn  . 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      . Multiple Vitamin (MULTIVITAMIN) tablet Take 1 tablet by mouth daily. Men's one-a-day formulation.      . potassium chloride SA (K-DUR,KLOR-CON) 20 MEQ tablet Take 1 tablet (20 mEq total) by mouth daily.  30 tablet  2  . Probiotic Product (PROBIOTIC DAILY PO) Take 1 tablet by mouth daily.      . prochlorperazine (COMPAZINE) 10 MG tablet Take 10 mg by mouth every 6 (six) hours as needed for nausea or vomiting.       No current facility-administered medications for this visit.   Facility-Administered Medications Ordered in Other Visits  Medication Dose Route Frequency Provider Last Rate Last Dose  . sodium chloride 0.9 % injection 10 mL  10 mL Intracatheter PRN Ladell Pier, MD   10 mL at 07/07/13 1326     Review of Systems:     Cardiac Review of Systems: Y or N  Chest Pain [ n   ]  Resting SOB [  n ] Exertional SOB  [n  ]  Cardell Peach  ]   Pedal Edema [n   ]    Palpitations Florencio.Farrier  ] Syncope  [ n ]   Presyncope [  n ]  General Review of Systems: [Y] = yes [  ]=no Constitional: recent weight change [n  ];  Wt loss over the last 3 months [   ] anorexia [  ]; fatigue Blue.Reese  ]; nausea [ n ]; night sweats [  ]; fever [  ]; or chills [  ];           Dental: poor dentition[  ]; Last Dentist visit:   Eye : blurred vision [  ]; diplopia [   ]; vision changes [  ];  Amaurosis fugax[  ]; Resp: cough [  ];  wheezing[  ];  hemoptysis[  ]; shortness of breath[  ]; paroxysmal nocturnal dyspnea[  ]; dyspnea on exertion[  ]; or orthopnea[  ];  GI:  gallstones[n  ], vomiting[ n ];  dysphagia[  ]; melena[  ];  hematochezia [  ]; heartburn[  ];   Hx of  Colonoscopy[ y ]; GU: kidney stones [  ]; hematuria[  ];   dysuria [  ];  nocturia[  ];  history of     obstruction [  ]; urinary frequency [  ]             Skin: rash, swelling[  ];, hair loss[  ];  peripheral edema[  ];  or itching[  ]; Musculosketetal: myalgias[  ];  joint swelling[  ];  joint erythema[  ];  joint pain[  ];  back pain[  ];  Heme/Lymph: bruising[  ];  bleeding[  ];  anemia[  ];  Neuro: TIA[n  ];  headaches[  ];  stroke[  ];  vertigo[  ];  seizures[  ];   paresthesias[  ];  difficulty walking[  ];  Psych:depression[  ]; anxiety[  ];  Endocrine: diabetes[n  ];  thyroid dysfunction[ n ];  Immunizations: Flu up to date [  ]; Pneumococcal up to date [  ];  Other:loose stoo  Physical Exam: BP 128/83  Pulse 79  Resp 16  Ht 5\' 10"  (1.778 m)  Wt 178 lb (80.74 kg)  BMI 25.54 kg/m2  SpO2 98%  PHYSICAL EXAMINATION:  General appearance: alert, cooperative and appears stated age Neurologic: intact Heart: regular rate and rhythm, S1, S2 normal, no murmur, click, rub or gallop Lungs: clear to auscultation bilaterally Abdomen: soft, non-tender; bowel sounds normal; no masses,  no organomegaly Extremities: extremities normal, atraumatic, no cyanosis or edema and Homans sign is negative, no sign of DVT Wound: Well-healed abdominal incisions from previous ileostomy   Diagnostic Studies & Laboratory data:     Recent Radiology Findings:   Ct Chest W Contrast/Ct Abdomen Pelvis W Contrast  08/14/2013   CLINICAL DATA:  Restaging rectal cancer. History of colon resection and metastatic disease.  Chemotherapy and radiation therapy completed.  EXAM: CT CHEST, ABDOMEN, AND PELVIS WITH CONTRAST  TECHNIQUE: Multidetector CT imaging of the chest, abdomen and pelvis was performed following the standard protocol during bolus administration of intravenous contrast.  CONTRAST:  145mL OMNIPAQUE IOHEXOL 300 MG/ML  SOLN  COMPARISON:  Prior examinations 06/20/2013 (CT) and abdominal MRI 04/11/2013.  FINDINGS: RECIST 1.1  Target Lesions:  1. Lesion in the dome of the right hepatic lobe measures 1.4 cm on image 43 (previously 1.7 cm). 2. Subcapsular lesion in the posterior segment of the right hepatic lobe measures 5 mm on image 49 (previously 6 mm). Non-target Lesions:  1. The previously demonstrated cavitary left lower lobe pulmonary nodule is subjectively smaller, but measures 4 mm on image 32 (4 mm previously). 2. See below.  CT CHEST FINDINGS  Left subclavian Port-A-Cath tip is in the lower SVC. There are no enlarged mediastinal, hilar or axillary lymph nodes. The thyroid gland and thoracic inlet appear normal.  There is no pleural or pericardial effusion. 4 mm cavitary nodule in the left lower lobe on image 32 is subjectively slightly smaller. Tiny subpleural left lower lobe nodule on image 36 is now nearly inconspicuous. No new or enlarging pulmonary nodules are identified. There is no confluent airspace opacity or endobronchial lesion.  There are no worrisome osseous findings.  CT ABDOMEN AND PELVIS FINDINGS  The dominant metastasis involving the dome of the right hepatic lobe has slightly decreased in size, measuring 1.4 cm on image 43 (previously 1.7 cm. Cystic lesion inferiorly in the right hepatic lobe measures 11 mm (9 mm previously). This did not appear to reflect a metastasis on MRI. Other small lesions are stable.  The spleen, gallbladder, pancreas, adrenal glands and kidneys appear normal.  Small retroperitoneal lymph nodes are stable. There is no adenopathy, ascites or peritoneal nodularity. There are no  significant vascular findings.  There is moderate stool throughout the colon. No focal mucosal lesions are identified. Postsurgical changes within the anterior abdominal wall and presacral fibrotic changes are stable. The bladder, prostate gland and seminal vesicles appear stable.  There are no worrisome osseous findings.  IMPRESSION: 1. Continued response to therapy in hepatic metastatic disease. 2. The cavitary left lower lobe pulmonary nodule also appears subjectively smaller. 3. No disease progression identified. 4. Stable presacral fibrotic changes.   Electronically Signed   By: Camie Patience M.D.   On: 08/14/2013 09:19    Recent Lab Findings: Lab Results  Component Value Date   WBC 5.8 08/16/2013   HGB 11.4* 08/16/2013   HCT 35.0* 08/16/2013   PLT 125* 08/16/2013   GLUCOSE 99 08/16/2013   ALT 38 08/16/2013   AST 26 08/16/2013   NA 141 08/16/2013   K  3.7 08/16/2013   CL 106 12/28/2012   CREATININE 0.8 08/16/2013   BUN 11.7 08/16/2013   CO2 22 08/16/2013   TSH 1.549 12/28/2012   INR 1.0 01/25/2012      Assessment / Plan:   History of rectal cancer, with probable liver metastasis and a 4 mm left lung nodule not definitely present on CT scan in 2013. At this point I would recommend proceeding with plans to treat the liver metastasis, including possible resection. A 4 mm lung nodule be a secondary consideration that'll need close followup. If they nodule in the lung appears to be enlarging over time specific treatment with wedge resection of the lung or stereotactic radiotherapy could be considered at that time. I will plan to see the patient back in 3 months and we'll coordinate with Dr. Benay Spice followup scans to evaluate any change in lung nodules.         I spent 30 minutes counseling the patient face to face. The total time spent in the appointment was 50 minutes.  Grace Isaac MD      [address] [place] [address] Office [phone]   Beeper  [phone]  08/30/2013 10:04 AM

## 2013-09-10 ENCOUNTER — Telehealth: Payer: Self-pay | Admitting: Oncology

## 2013-09-10 NOTE — Telephone Encounter (Signed)
s.w. pt and r/s appt due to pt having surgery...done...pt ok adn aware

## 2013-09-12 ENCOUNTER — Encounter (HOSPITAL_COMMUNITY): Payer: Self-pay

## 2013-09-12 ENCOUNTER — Encounter (HOSPITAL_COMMUNITY)
Admission: RE | Admit: 2013-09-12 | Discharge: 2013-09-12 | Disposition: A | Discharge status: Home / Self Care | Payer: BC Managed Care – PPO | Source: Ambulatory Visit | Attending: General Surgery | Admitting: General Surgery

## 2013-09-12 ENCOUNTER — Encounter (HOSPITAL_COMMUNITY): Payer: Self-pay | Admitting: Pharmacy Technician

## 2013-09-12 DIAGNOSIS — Z01812 Encounter for preprocedural laboratory examination: Secondary | ICD-10-CM | POA: Insufficient documentation

## 2013-09-12 HISTORY — DX: Solitary pulmonary nodule: R91.1

## 2013-09-12 HISTORY — DX: Personal history of other specified conditions: Z87.898

## 2013-09-12 LAB — CBC WITH DIFFERENTIAL/PLATELET
Basophils Absolute: 0 10*3/uL (ref 0.0–0.1)
Basophils Relative: 0 % (ref 0–1)
Eosinophils Absolute: 0.2 10*3/uL (ref 0.0–0.7)
Eosinophils Relative: 5 % (ref 0–5)
HEMATOCRIT: 37.3 % — AB (ref 39.0–52.0)
HEMOGLOBIN: 12.4 g/dL — AB (ref 13.0–17.0)
Lymphocytes Relative: 12 % (ref 12–46)
Lymphs Abs: 0.6 10*3/uL — ABNORMAL LOW (ref 0.7–4.0)
MCH: 30.7 pg (ref 26.0–34.0)
MCHC: 33.2 g/dL (ref 30.0–36.0)
MCV: 92.3 fL (ref 78.0–100.0)
MONO ABS: 0.4 10*3/uL (ref 0.1–1.0)
MONOS PCT: 9 % (ref 3–12)
Neutro Abs: 3.4 10*3/uL (ref 1.7–7.7)
Neutrophils Relative %: 73 % (ref 43–77)
Platelets: 116 10*3/uL — ABNORMAL LOW (ref 150–400)
RBC: 4.04 MIL/uL — AB (ref 4.22–5.81)
RDW: 15.4 % (ref 11.5–15.5)
WBC: 4.6 10*3/uL (ref 4.0–10.5)

## 2013-09-12 LAB — COMPREHENSIVE METABOLIC PANEL
ALT: 40 U/L (ref 0–53)
AST: 29 U/L (ref 0–37)
Albumin: 3.7 g/dL (ref 3.5–5.2)
Alkaline Phosphatase: 46 U/L (ref 39–117)
BUN: 13 mg/dL (ref 6–23)
CO2: 25 mEq/L (ref 19–32)
Calcium: 9.2 mg/dL (ref 8.4–10.5)
Chloride: 105 mEq/L (ref 96–112)
Creatinine, Ser: 0.87 mg/dL (ref 0.50–1.35)
GFR calc Af Amer: >90 mL/min (ref >90)
GFR calc non Af Amer: >90 mL/min (ref >90)
Glucose, Bld: 102 mg/dL — ABNORMAL HIGH (ref 70–99)
Potassium: 3.8 mEq/L (ref 3.7–5.3)
SODIUM: 144 meq/L (ref 137–147)
TOTAL PROTEIN: 6.5 g/dL (ref 6.0–8.3)
Total Bilirubin: 0.3 mg/dL (ref 0.3–1.2)

## 2013-09-12 LAB — URINALYSIS, ROUTINE W REFLEX MICROSCOPIC
Bilirubin Urine: NEGATIVE
Glucose, UA: NEGATIVE mg/dL
HGB URINE DIPSTICK: NEGATIVE
KETONES UR: NEGATIVE mg/dL
Leukocytes, UA: NEGATIVE
NITRITE: NEGATIVE
Protein, ur: NEGATIVE mg/dL
Specific Gravity, Urine: 1.029 (ref 1.005–1.030)
UROBILINOGEN UA: 1 mg/dL (ref 0.0–1.0)
pH: 6 (ref 5.0–8.0)

## 2013-09-12 LAB — ABO/RH: ABO/RH(D): A NEG

## 2013-09-12 LAB — PREPARE RBC (CROSSMATCH)

## 2013-09-12 LAB — PROTIME-INR
INR: 1.03 (ref 0.00–1.49)
Prothrombin Time: 13.5 seconds (ref 11.6–15.2)

## 2013-09-12 NOTE — Pre-Procedure Instructions (Signed)
OMERE MARTI  09/12/2013   Your procedure is scheduled on:  Monday, June 29  Report to [facility] Admitting at 0530 AM.  Call this number if you have problems the morning of surgery: [phone]   Remember:   Do not eat food or drink liquids after midnight.Sunday night   Take these medicines the morning of surgery with A SIP OF WATER: None   Do not wear jewelry.  Do not wear lotions, powders, or perfumes. You may wear deodorant.  Do not shave 48 hours prior to surgery. Men may shave face and neck.  Do not bring valuables to the hospital.  [facility] is not responsible for any belongings or valuables.               Contacts, dentures or bridgework may not be worn into surgery.  Leave suitcase in the car. After surgery it may be brought to your room.  For patients admitted to the hospital, discharge time is determined by your  treatment team.      Special Instructions: [place] - Preparing for Surgery  Before surgery, you can play an important role.  Because skin is not sterile, your skin needs to be as free of germs as possible.  You can reduce the number of germs on you skin by washing with CHG (chlorahexidine gluconate) soap before surgery.  CHG is an antiseptic cleaner which kills germs and bonds with the skin to continue killing germs even after washing.  Please DO NOT use if you have an allergy to CHG or antibacterial soaps.  If your skin becomes reddened/irritated stop using the CHG and inform your nurse when you arrive at Short Stay.  Do not shave (including legs and underarms) for at least 48 hours prior to the first CHG shower.  You may shave your face.  Please follow these instructions carefully:   1.  Shower with CHG Soap the night before surgery and the morning of Surgery.  2.  If you choose to wash your hair, wash your hair first as usual with your  normal shampoo.  3.  After you shampoo, rinse your hair and body thoroughly to remove the  Shampoo.  4.   Use CHG as you would any other liquid soap.  You can apply chg directly  to the skin and wash gently with scrungie or a clean washcloth.  5.  Apply the CHG Soap to your body ONLY FROM THE NECK DOWN.    Do not use on open wounds or open sores.  Avoid contact with your eyes, ears, mouth and genitals (private parts).  Wash genitals (private parts) with your normal soap.  6.  Wash thoroughly, paying special attention to the area where your surgery  will be performed.  7.  Thoroughly rinse your body with warm water from the neck down.  8.  DO NOT shower/wash with your normal soap after using and rinse with the CHG Soap.  9.  Pat yourself dry with a clean towel.            10.  Wear clean pajamas.            11 .  Place clean sheets on your bed the night of your first shower and do not sleep with pets.  Day of Surgery  Do not apply any lotions/deoderants the morning of surgery.  Please wear clean clothes to the hospital/surgery center.     Please read over the following fact sheets that  you were given: Pain Booklet, Coughing and Deep Breathing, Blood Transfusion Information and Surgical Site Infection Prevention

## 2013-09-16 MED ORDER — CIPROFLOXACIN IN D5W 400 MG/200ML IV SOLN
400.0000 mg | INTRAVENOUS | Status: DC
  Filled 2013-09-16: qty 200

## 2013-09-17 ENCOUNTER — Encounter (HOSPITAL_COMMUNITY): Payer: BC Managed Care – PPO | Admitting: Certified Registered"

## 2013-09-17 ENCOUNTER — Encounter (HOSPITAL_COMMUNITY)
Admission: RE | Disposition: A | Discharge status: Home / Self Care | Payer: Self-pay | Source: Ambulatory Visit | Attending: General Surgery

## 2013-09-17 ENCOUNTER — Inpatient Hospital Stay (HOSPITAL_COMMUNITY): Payer: BC Managed Care – PPO

## 2013-09-17 ENCOUNTER — Encounter (HOSPITAL_COMMUNITY): Payer: Self-pay | Admitting: Anesthesiology

## 2013-09-17 ENCOUNTER — Inpatient Hospital Stay (HOSPITAL_COMMUNITY)
Admission: RE | Admit: 2013-09-17 | Discharge: 2013-09-24 | DRG: 406 | Disposition: A | Discharge status: Home / Self Care | Payer: BC Managed Care – PPO | Source: Ambulatory Visit | Attending: General Surgery | Admitting: General Surgery

## 2013-09-17 ENCOUNTER — Ambulatory Visit (HOSPITAL_COMMUNITY): Payer: BC Managed Care – PPO | Admitting: Certified Registered"

## 2013-09-17 DIAGNOSIS — F411 Generalized anxiety disorder: Secondary | ICD-10-CM | POA: Diagnosis present

## 2013-09-17 DIAGNOSIS — D696 Thrombocytopenia, unspecified: Secondary | ICD-10-CM | POA: Diagnosis present

## 2013-09-17 DIAGNOSIS — R188 Other ascites: Secondary | ICD-10-CM | POA: Diagnosis not present

## 2013-09-17 DIAGNOSIS — K219 Gastro-esophageal reflux disease without esophagitis: Secondary | ICD-10-CM | POA: Diagnosis present

## 2013-09-17 DIAGNOSIS — K7689 Other specified diseases of liver: Secondary | ICD-10-CM | POA: Diagnosis present

## 2013-09-17 DIAGNOSIS — E8809 Other disorders of plasma-protein metabolism, not elsewhere classified: Secondary | ICD-10-CM | POA: Diagnosis present

## 2013-09-17 DIAGNOSIS — I10 Essential (primary) hypertension: Secondary | ICD-10-CM | POA: Diagnosis not present

## 2013-09-17 DIAGNOSIS — G620 Drug-induced polyneuropathy: Secondary | ICD-10-CM | POA: Diagnosis present

## 2013-09-17 DIAGNOSIS — E861 Hypovolemia: Secondary | ICD-10-CM | POA: Diagnosis not present

## 2013-09-17 DIAGNOSIS — C787 Secondary malignant neoplasm of liver and intrahepatic bile duct: Secondary | ICD-10-CM

## 2013-09-17 DIAGNOSIS — Z923 Personal history of irradiation: Secondary | ICD-10-CM

## 2013-09-17 DIAGNOSIS — R000 Tachycardia, unspecified: Secondary | ICD-10-CM | POA: Diagnosis not present

## 2013-09-17 DIAGNOSIS — C20 Malignant neoplasm of rectum: Secondary | ICD-10-CM | POA: Diagnosis present

## 2013-09-17 HISTORY — PX: LIVER ULTRASOUND: SHX5952

## 2013-09-17 HISTORY — PX: LAPAROSCOPY: SHX197

## 2013-09-17 HISTORY — PX: CHOLECYSTECTOMY: SHX55

## 2013-09-17 HISTORY — PX: OPEN HEPATECTOMY [83]: SHX5986

## 2013-09-17 LAB — CREATININE, SERUM: CREATININE: 0.88 mg/dL (ref 0.50–1.35)

## 2013-09-17 LAB — CBC
HEMATOCRIT: 35.8 % — AB (ref 39.0–52.0)
Hemoglobin: 11.5 g/dL — ABNORMAL LOW (ref 13.0–17.0)
MCH: 29.9 pg (ref 26.0–34.0)
MCHC: 32.1 g/dL (ref 30.0–36.0)
MCV: 93 fL (ref 78.0–100.0)
Platelets: 224 10*3/uL (ref 150–400)
RBC: 3.85 MIL/uL — AB (ref 4.22–5.81)
RDW: 15.9 % — ABNORMAL HIGH (ref 11.5–15.5)
WBC: 14.3 10*3/uL — AB (ref 4.0–10.5)

## 2013-09-17 SURGERY — HEPATECTOMY, OPEN
Anesthesia: General | Site: Abdomen

## 2013-09-17 MED ORDER — BUPIVACAINE 0.25 % ON-Q PUMP DUAL CATH 300 ML
300.0000 mL | INJECTION | Status: DC
  Administered 2013-09-17: 300 mL
  Filled 2013-09-17: qty 300

## 2013-09-17 MED ORDER — SODIUM CHLORIDE 0.9 % IR SOLN
Status: DC | PRN
  Administered 2013-09-17: 1000 mL

## 2013-09-17 MED ORDER — PHENYLEPHRINE HCL 10 MG/ML IJ SOLN
INTRAMUSCULAR | Status: DC | PRN
  Administered 2013-09-17 (×2): 40 ug via INTRAVENOUS

## 2013-09-17 MED ORDER — FENTANYL CITRATE 0.05 MG/ML IJ SOLN
INTRAMUSCULAR | Status: DC | PRN
  Administered 2013-09-17 (×3): 50 ug via INTRAVENOUS
  Administered 2013-09-17 (×2): 25 ug via INTRAVENOUS
  Administered 2013-09-17: 50 ug via INTRAVENOUS
  Administered 2013-09-17: 25 ug via INTRAVENOUS
  Administered 2013-09-17: 50 ug via INTRAVENOUS
  Administered 2013-09-17: 100 ug via INTRAVENOUS
  Administered 2013-09-17: 25 ug via INTRAVENOUS
  Administered 2013-09-17: 50 ug via INTRAVENOUS
  Administered 2013-09-17 (×2): 25 ug via INTRAVENOUS
  Administered 2013-09-17: 50 ug via INTRAVENOUS

## 2013-09-17 MED ORDER — LIDOCAINE HCL (CARDIAC) 20 MG/ML IV SOLN
INTRAVENOUS | Status: DC | PRN
  Administered 2013-09-17: 50 mg via INTRAVENOUS

## 2013-09-17 MED ORDER — PHENYLEPHRINE HCL 10 MG/ML IJ SOLN
10.0000 mg | INTRAVENOUS | Status: DC | PRN
  Administered 2013-09-17: 20 ug/min via INTRAVENOUS

## 2013-09-17 MED ORDER — BUPIVACAINE-EPINEPHRINE (PF) 0.25% -1:200000 IJ SOLN
INTRAMUSCULAR | Status: AC
  Filled 2013-09-17: qty 30

## 2013-09-17 MED ORDER — DOCUSATE SODIUM 100 MG PO CAPS: 100.0000 mg | ORAL_CAPSULE | Freq: Every day | ORAL | Status: DC | PRN

## 2013-09-17 MED ORDER — OXYCODONE HCL 5 MG/5ML PO SOLN: 5.0000 mg | Freq: Once | ORAL | Status: DC | PRN

## 2013-09-17 MED ORDER — CIPROFLOXACIN IN D5W 400 MG/200ML IV SOLN
INTRAVENOUS | Status: DC | PRN
  Administered 2013-09-17: 400 mg via INTRAVENOUS

## 2013-09-17 MED ORDER — MORPHINE SULFATE 2 MG/ML IJ SOLN
1.0000 mg | INTRAMUSCULAR | Status: DC | PRN
  Administered 2013-09-18 – 2013-09-23 (×4): 2 mg via INTRAVENOUS
  Filled 2013-09-17 (×4): qty 1

## 2013-09-17 MED ORDER — HYDROMORPHONE HCL PF 1 MG/ML IJ SOLN
INTRAMUSCULAR | Status: DC | PRN
  Administered 2013-09-17 (×2): 0.5 mg via INTRAVENOUS

## 2013-09-17 MED ORDER — MIDAZOLAM HCL 2 MG/2ML IJ SOLN
INTRAMUSCULAR | Status: AC
  Filled 2013-09-17: qty 2

## 2013-09-17 MED ORDER — LIDOCAINE HCL (PF) 1 % IJ SOLN
INTRAMUSCULAR | Status: AC
  Filled 2013-09-17: qty 30

## 2013-09-17 MED ORDER — CIPROFLOXACIN IN D5W 400 MG/200ML IV SOLN
400.0000 mg | Freq: Two times a day (BID) | INTRAVENOUS | Status: AC
  Administered 2013-09-17: 400 mg via INTRAVENOUS
  Filled 2013-09-17: qty 200

## 2013-09-17 MED ORDER — SUCCINYLCHOLINE CHLORIDE 20 MG/ML IJ SOLN
INTRAMUSCULAR | Status: AC
  Filled 2013-09-17: qty 1

## 2013-09-17 MED ORDER — LIDOCAINE HCL (CARDIAC) 20 MG/ML IV SOLN
INTRAVENOUS | Status: AC
  Filled 2013-09-17: qty 5

## 2013-09-17 MED ORDER — NALOXONE HCL 0.4 MG/ML IJ SOLN
0.4000 mg | INTRAMUSCULAR | Status: DC | PRN
  Filled 2013-09-17: qty 1

## 2013-09-17 MED ORDER — FENTANYL CITRATE 0.05 MG/ML IJ SOLN
INTRAMUSCULAR | Status: AC
  Filled 2013-09-17: qty 5

## 2013-09-17 MED ORDER — MIDAZOLAM HCL 5 MG/5ML IJ SOLN
INTRAMUSCULAR | Status: DC | PRN
  Administered 2013-09-17 (×2): 1 mg via INTRAVENOUS

## 2013-09-17 MED ORDER — BIOTENE DRY MOUTH MT LIQD
15.0000 mL | Freq: Two times a day (BID) | OROMUCOSAL | Status: DC
  Administered 2013-09-17 – 2013-09-23 (×8): 15 mL via OROMUCOSAL

## 2013-09-17 MED ORDER — MORPHINE SULFATE (PF) 1 MG/ML IV SOLN
INTRAVENOUS | Status: AC
  Filled 2013-09-17: qty 25

## 2013-09-17 MED ORDER — ROCURONIUM BROMIDE 50 MG/5ML IV SOLN
INTRAVENOUS | Status: AC
  Filled 2013-09-17: qty 1

## 2013-09-17 MED ORDER — BUPIVACAINE HCL (PF) 0.25 % IJ SOLN
INTRAMUSCULAR | Status: AC
  Filled 2013-09-17: qty 30

## 2013-09-17 MED ORDER — PROPOFOL 10 MG/ML IV BOLUS
INTRAVENOUS | Status: AC
  Filled 2013-09-17: qty 20

## 2013-09-17 MED ORDER — HYDROMORPHONE HCL PF 1 MG/ML IJ SOLN
INTRAMUSCULAR | Status: AC
  Filled 2013-09-17: qty 1

## 2013-09-17 MED ORDER — DIPHENHYDRAMINE HCL 12.5 MG/5ML PO ELIX
12.5000 mg | ORAL_SOLUTION | Freq: Four times a day (QID) | ORAL | Status: DC | PRN
  Filled 2013-09-17: qty 5

## 2013-09-17 MED ORDER — ROCURONIUM BROMIDE 100 MG/10ML IV SOLN
INTRAVENOUS | Status: DC | PRN
  Administered 2013-09-17: 10 mg via INTRAVENOUS
  Administered 2013-09-17: 50 mg via INTRAVENOUS
  Administered 2013-09-17 (×2): 10 mg via INTRAVENOUS

## 2013-09-17 MED ORDER — GLYCOPYRROLATE 0.2 MG/ML IJ SOLN
INTRAMUSCULAR | Status: DC | PRN
  Administered 2013-09-17: 0.6 mg via INTRAVENOUS

## 2013-09-17 MED ORDER — ONDANSETRON HCL 4 MG/2ML IJ SOLN
4.0000 mg | Freq: Four times a day (QID) | INTRAMUSCULAR | Status: DC | PRN
Start: 2013-09-17 — End: 2013-09-24
  Administered 2013-09-19 – 2013-09-20 (×2): 4 mg via INTRAVENOUS
  Filled 2013-09-17 (×3): qty 2

## 2013-09-17 MED ORDER — DIPHENHYDRAMINE HCL 50 MG/ML IJ SOLN
12.5000 mg | Freq: Four times a day (QID) | INTRAMUSCULAR | Status: DC | PRN
Start: 2013-09-17 — End: 2013-09-24
  Filled 2013-09-17: qty 0.25

## 2013-09-17 MED ORDER — EVICEL 5 ML EX KIT
PACK | CUTANEOUS | Status: DC | PRN
  Administered 2013-09-17: 1

## 2013-09-17 MED ORDER — LACTATED RINGERS IV SOLN
INTRAVENOUS | Status: DC | PRN
  Administered 2013-09-17: 07:00:00 via INTRAVENOUS

## 2013-09-17 MED ORDER — ONDANSETRON HCL 4 MG PO TABS: 4.0000 mg | ORAL_TABLET | Freq: Four times a day (QID) | ORAL | Status: DC | PRN

## 2013-09-17 MED ORDER — METOCLOPRAMIDE HCL 5 MG/ML IJ SOLN: 10.0000 mg | Freq: Once | INTRAMUSCULAR | Status: DC | PRN

## 2013-09-17 MED ORDER — BUPIVACAINE ON-Q PAIN PUMP (FOR ORDER SET NO CHG)
INJECTION | Status: AC
  Filled 2013-09-17: qty 1

## 2013-09-17 MED ORDER — FENTANYL CITRATE 0.05 MG/ML IJ SOLN
INTRAMUSCULAR | Status: AC
  Filled 2013-09-17: qty 2

## 2013-09-17 MED ORDER — LIDOCAINE HCL 1 % IJ SOLN
INTRAMUSCULAR | Status: DC | PRN
  Administered 2013-09-17: 08:00:00

## 2013-09-17 MED ORDER — ONDANSETRON HCL 4 MG/2ML IJ SOLN
4.0000 mg | Freq: Four times a day (QID) | INTRAMUSCULAR | Status: DC | PRN
  Filled 2013-09-17: qty 2

## 2013-09-17 MED ORDER — SODIUM CHLORIDE 0.9 % IJ SOLN
9.0000 mL | INTRAMUSCULAR | Status: DC | PRN
  Administered 2013-09-22: 10 mL via INTRAVENOUS
  Administered 2013-09-23: 9 mL via INTRAVENOUS
  Administered 2013-09-24: 10 mL via INTRAVENOUS

## 2013-09-17 MED ORDER — KCL IN DEXTROSE-NACL 20-5-0.45 MEQ/L-%-% IV SOLN
INTRAVENOUS | Status: DC
  Administered 2013-09-17 – 2013-09-22 (×12): via INTRAVENOUS
  Filled 2013-09-17 (×17): qty 1000

## 2013-09-17 MED ORDER — HYDROMORPHONE HCL PF 1 MG/ML IJ SOLN
0.2500 mg | INTRAMUSCULAR | Status: DC | PRN
  Administered 2013-09-17 (×2): 0.5 mg via INTRAVENOUS

## 2013-09-17 MED ORDER — ALPRAZOLAM 0.5 MG PO TABS: 0.5000 mg | ORAL_TABLET | Freq: Every day | ORAL | Status: DC | PRN

## 2013-09-17 MED ORDER — PROMETHAZINE HCL 25 MG/ML IJ SOLN: 12.5000 mg | Freq: Four times a day (QID) | INTRAMUSCULAR | Status: DC | PRN

## 2013-09-17 MED ORDER — CHLORHEXIDINE GLUCONATE 0.12 % MT SOLN
15.0000 mL | Freq: Two times a day (BID) | OROMUCOSAL | Status: DC
  Administered 2013-09-17 – 2013-09-23 (×10): 15 mL via OROMUCOSAL
  Filled 2013-09-17 (×10): qty 15

## 2013-09-17 MED ORDER — PROPOFOL 10 MG/ML IV BOLUS
INTRAVENOUS | Status: DC | PRN
  Administered 2013-09-17: 30 mg via INTRAVENOUS
  Administered 2013-09-17: 200 mg via INTRAVENOUS

## 2013-09-17 MED ORDER — OXYCODONE HCL 5 MG PO TABS
5.0000 mg | ORAL_TABLET | Freq: Once | ORAL | Status: DC | PRN
Start: 2013-09-17 — End: 2013-09-17

## 2013-09-17 MED ORDER — THROMBIN 20000 UNITS EX KIT
PACK | CUTANEOUS | Status: AC
  Filled 2013-09-17: qty 1

## 2013-09-17 MED ORDER — ALBUMIN HUMAN 5 % IV SOLN
INTRAVENOUS | Status: DC | PRN
  Administered 2013-09-17 (×2): via INTRAVENOUS

## 2013-09-17 MED ORDER — 0.9 % SODIUM CHLORIDE (POUR BTL) OPTIME
TOPICAL | Status: DC | PRN
  Administered 2013-09-17 (×3): 1000 mL

## 2013-09-17 MED ORDER — EPHEDRINE SULFATE 50 MG/ML IJ SOLN
INTRAMUSCULAR | Status: DC | PRN
  Administered 2013-09-17: 5 mg via INTRAVENOUS

## 2013-09-17 MED ORDER — NEOSTIGMINE METHYLSULFATE 10 MG/10ML IV SOLN
INTRAVENOUS | Status: DC | PRN
  Administered 2013-09-17: 4 mg via INTRAVENOUS

## 2013-09-17 MED ORDER — MORPHINE SULFATE (PF) 1 MG/ML IV SOLN
INTRAVENOUS | Status: DC
  Administered 2013-09-17: 10.5 mg via INTRAVENOUS
  Administered 2013-09-17 (×2): via INTRAVENOUS
  Administered 2013-09-17: 16.55 mg via INTRAVENOUS
  Administered 2013-09-17: 4 mg via INTRAVENOUS
  Administered 2013-09-18 (×2): via INTRAVENOUS
  Administered 2013-09-18: 19.11 mg via INTRAVENOUS
  Administered 2013-09-18: 1.5 mg via INTRAVENOUS
  Administered 2013-09-18: 10.5 mg via INTRAVENOUS
  Administered 2013-09-18: 07:00:00 via INTRAVENOUS
  Administered 2013-09-18: 22.5 mg via INTRAVENOUS
  Administered 2013-09-18: 16.5 mg via INTRAVENOUS
  Administered 2013-09-18: 10.5 mg via INTRAVENOUS
  Administered 2013-09-19: 15 mg via INTRAVENOUS
  Administered 2013-09-19: 18:00:00 via INTRAVENOUS
  Administered 2013-09-19: 4.5 mg via INTRAVENOUS
  Administered 2013-09-19 (×2): 7.5 mg via INTRAVENOUS
  Administered 2013-09-19: 9.4 mg via INTRAVENOUS
  Administered 2013-09-19: 1.5 mg via INTRAVENOUS
  Administered 2013-09-19: 10.5 mg via INTRAVENOUS
  Administered 2013-09-19: 09:00:00 via INTRAVENOUS
  Administered 2013-09-20: 9 mg via INTRAVENOUS
  Administered 2013-09-20: 2 mg via INTRAVENOUS
  Administered 2013-09-20: 7.5 mg via INTRAVENOUS
  Administered 2013-09-20: 6.28 mg via INTRAVENOUS
  Administered 2013-09-20: 6 mg via INTRAVENOUS
  Administered 2013-09-20: 9 mg via INTRAVENOUS
  Administered 2013-09-21: 7.5 mg via INTRAVENOUS
  Administered 2013-09-21: 21:00:00 via INTRAVENOUS
  Administered 2013-09-21: 10.5 mg via INTRAVENOUS
  Administered 2013-09-21: 18 mg via INTRAVENOUS
  Administered 2013-09-21: 12 mg via INTRAVENOUS
  Administered 2013-09-21: 3 mg via INTRAVENOUS
  Administered 2013-09-21: 6 mg via INTRAVENOUS
  Administered 2013-09-21: 08:00:00 via INTRAVENOUS
  Administered 2013-09-22: 15 mg via INTRAVENOUS
  Administered 2013-09-22: 8.34 mg via INTRAVENOUS
  Administered 2013-09-22: 14:00:00 via INTRAVENOUS
  Administered 2013-09-22: 4.5 mg via INTRAVENOUS
  Administered 2013-09-22: 8 mg via INTRAVENOUS
  Administered 2013-09-22: 12 mg via INTRAVENOUS
  Administered 2013-09-22: 06:00:00 via INTRAVENOUS
  Administered 2013-09-22: 12 mg via INTRAVENOUS
  Administered 2013-09-23: 9 mg via INTRAVENOUS
  Administered 2013-09-23: via INTRAVENOUS
  Filled 2013-09-17 (×14): qty 25

## 2013-09-17 MED ORDER — EVICEL 5 ML EX KIT
PACK | CUTANEOUS | Status: AC
  Filled 2013-09-17: qty 1

## 2013-09-17 MED ORDER — ENOXAPARIN SODIUM 40 MG/0.4ML ~~LOC~~ SOLN
40.0000 mg | SUBCUTANEOUS | Status: DC
  Administered 2013-09-18: 40 mg via SUBCUTANEOUS
  Filled 2013-09-17 (×2): qty 0.4

## 2013-09-17 SURGICAL SUPPLY — 97 items
BLADE SURG ROTATE 9660 (MISCELLANEOUS) ×3 IMPLANT
BOOT SUTURE AID YELLOW STND (SUTURE) IMPLANT
CANISTER SUCTION 2500CC (MISCELLANEOUS) ×3 IMPLANT
CHLORAPREP W/TINT 26ML (MISCELLANEOUS) ×3 IMPLANT
CLIP LIGATING HEM O LOK PURPLE (MISCELLANEOUS) ×3 IMPLANT
CLIP LIGATING HEMO O LOK GREEN (MISCELLANEOUS) ×3 IMPLANT
CLIP LIGATING HEMOLOK MED (MISCELLANEOUS) ×3 IMPLANT
CLIP TI LARGE 6 (CLIP) ×3 IMPLANT
CLIP TI MEDIUM 24 (CLIP) ×3 IMPLANT
CLOSURE WOUND 1/2 X4 (GAUZE/BANDAGES/DRESSINGS) ×1
CONT SPEC 4OZ CLIKSEAL STRL BL (MISCELLANEOUS) IMPLANT
COVER SURGICAL LIGHT HANDLE (MISCELLANEOUS) ×3 IMPLANT
DERMABOND ADVANCED (GAUZE/BANDAGES/DRESSINGS) ×2
DERMABOND ADVANCED .7 DNX12 (GAUZE/BANDAGES/DRESSINGS) ×1 IMPLANT
DRAIN CHANNEL 19F RND (DRAIN) ×3 IMPLANT
DRAIN PENROSE 1/2X36 STERILE (WOUND CARE) ×3 IMPLANT
DRAPE LAPAROSCOPIC ABDOMINAL (DRAPES) ×3 IMPLANT
DRAPE POUCH INSTRU U-SHP 10X18 (DRAPES) ×3 IMPLANT
DRAPE UTILITY 15X26 W/TAPE STR (DRAPE) ×9 IMPLANT
DRAPE WARM FLUID 44X44 (DRAPE) ×9 IMPLANT
DRSG COVADERM 4X10 (GAUZE/BANDAGES/DRESSINGS) ×3 IMPLANT
DRSG COVADERM 4X14 (GAUZE/BANDAGES/DRESSINGS) ×3 IMPLANT
DRSG TEGADERM 4X4.75 (GAUZE/BANDAGES/DRESSINGS) ×3 IMPLANT
ELECT BLADE 6.5 EXT (BLADE) ×3 IMPLANT
ELECT CAUTERY BLADE 6.4 (BLADE) ×3 IMPLANT
ELECT PAD DSPR THERM+ ADLT (MISCELLANEOUS) ×3 IMPLANT
ELECT REM PT RETURN 9FT ADLT (ELECTROSURGICAL) ×3
ELECTRODE REM PT RTRN 9FT ADLT (ELECTROSURGICAL) ×1 IMPLANT
EVACUATOR SILICONE 100CC (DRAIN) ×3 IMPLANT
GLOVE BIO SURGEON STRL SZ 6 (GLOVE) ×6 IMPLANT
GLOVE BIO SURGEON STRL SZ 6.5 (GLOVE) ×2 IMPLANT
GLOVE BIO SURGEON STRL SZ8 (GLOVE) ×3 IMPLANT
GLOVE BIO SURGEONS STRL SZ 6.5 (GLOVE) ×1
GLOVE BIOGEL PI IND STRL 6.5 (GLOVE) ×1 IMPLANT
GLOVE BIOGEL PI IND STRL 7.5 (GLOVE) ×1 IMPLANT
GLOVE BIOGEL PI INDICATOR 6.5 (GLOVE) ×2
GLOVE BIOGEL PI INDICATOR 7.5 (GLOVE) ×2
GLOVE ECLIPSE 7.5 STRL STRAW (GLOVE) ×3 IMPLANT
GOWN STRL REUS W/ TWL LRG LVL3 (GOWN DISPOSABLE) ×3 IMPLANT
GOWN STRL REUS W/ TWL XL LVL3 (GOWN DISPOSABLE) ×1 IMPLANT
GOWN STRL REUS W/TWL 2XL LVL3 (GOWN DISPOSABLE) ×3 IMPLANT
GOWN STRL REUS W/TWL LRG LVL3 (GOWN DISPOSABLE) ×6
GOWN STRL REUS W/TWL XL LVL3 (GOWN DISPOSABLE) ×2
HAND PENCIL TRP OPTION (MISCELLANEOUS) ×3 IMPLANT
HEMOSTAT SURGICEL 2X14 (HEMOSTASIS) IMPLANT
KIT BASIN OR (CUSTOM PROCEDURE TRAY) ×3 IMPLANT
KIT ROOM TURNOVER OR (KITS) ×3 IMPLANT
LOOP VESSEL MAXI BLUE (MISCELLANEOUS) ×3 IMPLANT
NEEDLE BIOPSY 14X6 SOFT TISS (NEEDLE) IMPLANT
NS IRRIG 1000ML POUR BTL (IV SOLUTION) ×6 IMPLANT
PACK GENERAL/GYN (CUSTOM PROCEDURE TRAY) ×3 IMPLANT
PAD ARMBOARD 7.5X6 YLW CONV (MISCELLANEOUS) ×6 IMPLANT
RELOAD WHITE ECR60W (STAPLE) ×15 IMPLANT
SCISSORS HARMONIC WAVE 18CM (INSTRUMENTS) ×3 IMPLANT
SEALER BIPOLAR AQUA 9.5 XL (INSTRUMENTS) ×3 IMPLANT
SET IRRIG TUBING LAPAROSCOPIC (IRRIGATION / IRRIGATOR) IMPLANT
SLEEVE ENDOPATH XCEL 5M (ENDOMECHANICALS) ×3 IMPLANT
SPONGE LAP 18X18 X RAY DECT (DISPOSABLE) ×15 IMPLANT
SPONGE SURGIFOAM ABS GEL 100 (HEMOSTASIS) IMPLANT
STAPLE ECHEON FLEX 60 POW ENDO (STAPLE) ×3 IMPLANT
STAPLER VISISTAT 35W (STAPLE) ×3 IMPLANT
STRIP CLOSURE SKIN 1/2X4 (GAUZE/BANDAGES/DRESSINGS) ×2 IMPLANT
SUCTION POOLE TIP (SUCTIONS) ×3 IMPLANT
SUT CHROMIC 3 0 SH 27 (SUTURE) IMPLANT
SUT CHROMIC 4 0 RB 1X27 (SUTURE) IMPLANT
SUT ETHILON 1 TP 1 60 (SUTURE) ×3 IMPLANT
SUT ETHILON 2 0 FS 18 (SUTURE) IMPLANT
SUT MNCRL AB 4-0 PS2 18 (SUTURE) ×3 IMPLANT
SUT PDS AB 1 TP1 96 (SUTURE) ×3 IMPLANT
SUT PDS II 0 TP-1 LOOPED 60 (SUTURE) ×12 IMPLANT
SUT PROLENE 2 0 SH DA (SUTURE) IMPLANT
SUT PROLENE 3 0 SH 48 (SUTURE) ×3 IMPLANT
SUT PROLENE 4 0 RB 1 (SUTURE) ×2
SUT PROLENE 4 0 SH DA (SUTURE) ×3 IMPLANT
SUT PROLENE 4-0 RB1 .5 CRCL 36 (SUTURE) ×1 IMPLANT
SUT PROLENE 5 0 C1 (SUTURE) IMPLANT
SUT VIC AB 2-0 CTX 27 (SUTURE) ×12 IMPLANT
SUT VIC AB 2-0 SH 18 (SUTURE) ×3 IMPLANT
SUT VIC AB 3-0 SH 18 (SUTURE) ×3 IMPLANT
SUT VIC AB 3-0 SH 8-18 (SUTURE) ×3 IMPLANT
SUT VICRYL AB 2 0 TIES (SUTURE) ×3 IMPLANT
SUT VICRYL AB 3 0 TIES (SUTURE) ×3 IMPLANT
SYR BULB IRRIGATION 50ML (SYRINGE) ×3 IMPLANT
TAPE UMBILICAL 1/8 X36 TWILL (MISCELLANEOUS) IMPLANT
TAPE UMBILICAL COTTON 1/8X30 (MISCELLANEOUS) IMPLANT
TOWEL OR 17X24 6PK STRL BLUE (TOWEL DISPOSABLE) ×3 IMPLANT
TOWEL OR 17X26 10 PK STRL BLUE (TOWEL DISPOSABLE) ×3 IMPLANT
TRAY FOLEY CATH 14FRSI W/METER (CATHETERS) ×3 IMPLANT
TRAY LAPAROSCOPIC (CUSTOM PROCEDURE TRAY) ×3 IMPLANT
TROCAR XCEL BLUNT TIP 100MML (ENDOMECHANICALS) ×3 IMPLANT
TROCAR XCEL NON-BLD 11X100MML (ENDOMECHANICALS) IMPLANT
TROCAR XCEL NON-BLD 5MMX100MML (ENDOMECHANICALS) ×3 IMPLANT
TUBE CONNECTING 12'X1/4 (SUCTIONS) ×1
TUBE CONNECTING 12X1/4 (SUCTIONS) ×2 IMPLANT
TUBE FEEDING 5FR 15 INCH (TUBING) IMPLANT
TUNNELER SHEATH ON-Q 16GX12 DP (PAIN MANAGEMENT) ×3 IMPLANT
YANKAUER SUCT BULB TIP NO VENT (SUCTIONS) ×3 IMPLANT

## 2013-09-17 NOTE — Interval H&P Note (Signed)
History and Physical Interval Note:  09/17/2013 7:27 AM  Jacob [name]  has presented today for surgery, with the diagnosis of rectal caner  The various methods of treatment have been discussed with the patient and family. After consideration of risks, benefits and other options for treatment, the patient has consented to  Procedure(s): OPEN HEPATECTOMY (N/A) LAPAROSCOPY DIAGNOSTIC (N/A) LIVER ULTRASOUND (N/A) as a surgical intervention .  The patient's history has been reviewed, patient examined, no change in status, stable for surgery.  I have reviewed the patient's chart and labs.  Questions were answered to the patient's satisfaction.     BYERLY,FAERA

## 2013-09-17 NOTE — Op Note (Signed)
PREOPERATIVE DIAGNOSIS: rectal cancer metastatic to liver  POSTOPERATIVE DIAGNOSIS:  Same  PROCEDURE PERFORMED:  Diagnostic laparoscopy, intraoperative liver ultrasound, right hepatic lobectomy  SURGEON:  Stark Klein, MD  ASSISTANT:  Kaylyn Lim, MD  ANESTHESIA:  General and local.  FINDINGS:  Multiple right sided liver masses consistent with metastases, left side appeared clear.  SPECIMEN:  Right lobe of the liver to Pathology, portal lymph nodes  ESTIMATED BLOOD LOSS:  350 mL.  COMPLICATIONS:  None Known  PROCEDURE:  Pt was identified in the holding area and taken to the operating room where she was placed supine on the operating room table.  General endotracheal anesthesia was induced.  Abdomen was prepped and draped in a sterile fashion.  A time-out was performed according to the surgical safety check list.  When all was correct, we continued.  The prior infraumbilical incision was anesthetized.  This was infiltrated with local anesthesia.  A vertical incision was made in the midline around 1.5 cm in length.  The subcutaneous skin was divided bluntly with a kelly clamp.  The fascia was elevated with 2 Kocher clamps.  The midline was incised.  A 0-0 vicryl pursestring suture was placed in the opening.  A Hasson was introduced into the abdomen.   Pneumoperitoneum was achieved.  The camera was placed in the abdomen and no overt signs of carcinomatosis were seen.  A second port was placed in the upper midline.  The ultrasound was used to segmentally examine the liver.  The inflow and outflow was visualized.  The right sided lesions were seen.  The largest was around 16 mm, consistent with prior imaging.  The left side appeared clear.  The peritoneal surface was examined as much as could be seen.    Decision was made to open.  A right subcostal incision extended up the midline was made with a #10.  The subcutaneous tissues were divided with the Bovie and then the fascia was opened  with the Bovie as well. There were several spots on the muscle that were coagulated.  The Bookwalter was then placed for assistance with visualization.  The right colon was mobilized and retracted inferiorly.  The liver was mobilized off the diaphragm.  The falciform was taken down with the cautery.  The gallbladder was taken off the liver with the cautery.  The cystic artery was divided with the harmonic.  The cystic duct was doubly clipped with locking weck clips and divided.  The porta was then exposed, the liver was retracted gently with Sweetheart retractor.  The gastrohepatic ligament was opened.  There was no sign of any replaced left hepatic artery or replaced right hepatic artery.  The right hepatic artery was identified as well as the left.  A vessel loop was passed around the main hepatic artery as well the right hepatic artery. The left was traced out with the doppler.  The right artery was test clamped and the doppler signal was reconfirmed in the left artery. The right artery was ligated, clipped, and suture ligated.  The common hepatic duct was retracted to the left and the left and right hepatic duct were traced out.  The right hepatic duct was tied, clipped, and ligated.  The portal vein was then exposed.  The right and left portal vein were traced out.  A vessel loop was placed around the right portal vein.  The right vein was test clamped and the left vein still had good signal.  The right portal vein was stapled  with a vascular load.     Attention was then directed to the liver.  The line of demarcation was seen.  The right hepatic vein was then isolated and stapled with a vascular stapler  The liver was scored at the line of demarcation with the Bovie.  The Aquamantys was then used to divide the bulk of the liver parenchyma.  Several sites were suture ligated with 2-0 vicryl.  The vascular pedicles were divided with Echelon staple loads, three of which were used on main portal  branch as well as the segment of 4 branches coming off of the middle hepatic vein.  The right liver was taken completely off. The argon beam coagulator was used to coagulate the liver bed.    The abdomen was irrigated was irrigated.  A 19 Blake drain was placed in the abdomen. There was still no bile leakage seen after waiting and evaluating the liver bed with a dry wrap.  Evicel was then applied to the liver edge and the Blake drain was placed going around the cut edge of the surface and over into the hepatic fossa.  The abdomen was irrigated.    The OnQ tunneler was placed on each side of the incision in the preperitoneal space.   The transverse portion of the incision was closed with 2 layers of #1 looped PDS.  The midline portion was closed with an additional #1 PDS.  The Blake drain was then sutured in place with a 2-0 nylon.  The pursestring suture was tied down at the umbilicus.  There was no residual fascial defect.  The skin was then irrigated and stapled.  The OnQ catheters were advanced into the tunneler catheters.  The wounds were cleaned, dried and dressed with Covaderm.  The patient was extubated and taken to PACU in stable condition.     Stark Klein, MD

## 2013-09-17 NOTE — Anesthesia Postprocedure Evaluation (Signed)
Anesthesia Post Note  Patient: Jacob [name]  Procedure(s) Performed: Procedure(s) (LRB): OPEN HEPATECTOMY (N/A) LAPAROSCOPY DIAGNOSTIC (N/A) LIVER ULTRASOUND (N/A) CHOLECYSTECTOMY (N/A)  Anesthesia type: General  Patient location: PACU  Post pain: Pain level controlled  Post assessment: Patient's Cardiovascular Status Stable  Last Vitals:  Filed Vitals:   09/17/13 1315  BP:   Pulse: 119  Temp:   Resp: 33    Post vital signs: Reviewed and stable  Level of consciousness: alert  Complications: No apparent anesthesia complications

## 2013-09-17 NOTE — Anesthesia Procedure Notes (Addendum)
Procedure Name: Intubation Date/Time: 09/17/2013 7:45 AM Performed by: Maeola Harman Pre-anesthesia Checklist: Patient identified, Timeout performed, Emergency Drugs available, Suction available and Patient being monitored Patient Re-evaluated:Patient Re-evaluated prior to inductionOxygen Delivery Method: Circle system utilized Preoxygenation: Pre-oxygenation with 100% oxygen Intubation Type: IV induction Ventilation: Mask ventilation without difficulty and Oral airway inserted - appropriate to patient size Laryngoscope Size: Mac and 3 Grade View: Grade II Tube type: Oral Tube size: 7.5 mm Number of attempts: 1 Airway Equipment and Method: Stylet Placement Confirmation: ETT inserted through vocal cords under direct vision,  breath sounds checked- equal and bilateral and positive ETCO2 Secured at: 24 cm Tube secured with: Tape Dental Injury: Teeth and Oropharynx as per pre-operative assessment  Comments: Performed by Wetzel Bjornstad SRNA

## 2013-09-17 NOTE — Transfer of Care (Signed)
Immediate Anesthesia Transfer of Care Note  Patient: Jacob [name]  Procedure(s) Performed: Procedure(s): OPEN HEPATECTOMY (N/A) LAPAROSCOPY DIAGNOSTIC (N/A) LIVER ULTRASOUND (N/A) CHOLECYSTECTOMY (N/A)  Patient Location: PACU  Anesthesia Type:General  Level of Consciousness: awake, alert  and oriented  Airway & Oxygen Therapy: Patient connected to nasal cannula oxygen  Post-op Assessment: Report given to PACU RN  Post vital signs: stable  Complications: No apparent anesthesia complications

## 2013-09-17 NOTE — Anesthesia Preprocedure Evaluation (Addendum)
Anesthesia Evaluation  Patient identified by MRN, date of birth, ID band Patient awake    Reviewed: Allergy & Precautions, H&P , NPO status , Patient's Chart, lab work & pertinent test results, reviewed documented beta blocker date and time   History of Anesthesia Complications (+) history of anesthetic complications  Airway Mallampati: II TM Distance: >3 FB Neck ROM: full    Dental  (+) Teeth Intact, Dental Advisory Given   Pulmonary neg pulmonary ROS,  breath sounds clear to auscultation        Cardiovascular + dysrhythmias Rhythm:regular     Neuro/Psych  Headaches,  Neuromuscular disease negative psych ROS   GI/Hepatic negative GI ROS, Neg liver ROS, GERD-  Medicated and Controlled,  Endo/Other  negative endocrine ROS  Renal/GU negative Renal ROS  negative genitourinary   Musculoskeletal   Abdominal (+)  Abdomen: soft. Bowel sounds: normal.  Peds  Hematology  (+) anemia ,   Anesthesia Other Findings See surgeon's H&P   Reproductive/Obstetrics negative OB ROS                         Anesthesia Physical Anesthesia Plan  ASA: III  Anesthesia Plan: General   Post-op Pain Management:    Induction: Intravenous  Airway Management Planned: Oral ETT  Additional Equipment: Arterial line and CVP  Intra-op Plan:   Post-operative Plan: Possible Post-op intubation/ventilation  Informed Consent: I have reviewed the patients History and Physical, chart, labs and discussed the procedure including the risks, benefits and alternatives for the proposed anesthesia with the patient or authorized representative who has indicated his/her understanding and acceptance.   Dental Advisory Given  Plan Discussed with: CRNA and Surgeon  Anesthesia Plan Comments:         Anesthesia Quick Evaluation

## 2013-09-17 NOTE — H&P (View-Only) (Signed)
Chief Complaint  Patient presents with  . spots on liver    HISTORY: Patient is a 43 year old male who was diagnosed with rectal cancer in December of 2013. He is sent by Dr. Benay Spice for request for consultation regarding his liver metastases. He primarily had neoadjuvant chemoradiation followed by a laparoscopic low anterior resection with coloanal anastomosis by Dr. Marcello Moores. He had an ileostomy at that time and underwent CAPOX. He had restaging in December 2014 for rising CEA and had a left lung base nodule and several right-sided liver lesions. He has undergone several months now of FOLFIRI/Avastin. His last Avastin was on May 14. I am requested to consider him for surgical resection of his liver metastases.  He feels well overall.     Past Medical History  Diagnosis Date  . Chicken pox as a child    ?  . Hernia 6 months old  . Allergic state 01/19/2012  . Reflux 01/19/2012  . Elevated liver function tests 01/19/2012  . Overweight 01/19/2012  . Preventative health care 01/19/2012  . Sun-damaged skin 01/19/2012  . Migraine headache as a child  . Hx of migraines   . Radiation 03/20/12-04/27/12    Pelvis 50.4 gray Rectal cancer  . Anxiety   . Fatty liver   . 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 Cancer 03/06/12    rectal bx=Adenocarcinoma  PT HAD RADIATION , CHEMO SURGERY  . Numbness     TOES OF BOTH FEET.  . Pain     LEFT HEEL PAIN -SEVERE ESPECIALLY AFTER SITTING OR LYING DOWN - DIFFICULT TO GET OUT OF BED AND WALK IN THE MORNINGS BECAUSE OF HEEL PAIN  . Anemia 12/31/2012  . Dysrhythmia as child    HX OF IRREGULAR HB AT TIME OF TONSILLECTOMY - SURGERY WAS NOT DONE-CAN'T REMEMBER ANY OTHER DETAILS- NEVER HAD THE SURGERY.  . 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 Lung abnormality 2015    Past Surgical History  Procedure Laterality Date  . Rectal biopsy  03/06/12    distal mass1-2cm from anal verge=Adenocarcinoma   . Eus  03/14/2012    Procedure: LOWER ENDOSCOPIC ULTRASOUND (EUS);  Surgeon: Milus Banister, MD;  Location: [facility];  Service: Endoscopy;  Laterality: N/A;  . Hernia repair  72 months old    right inguinal repair  . Cyst excision      L EAR AREA  . Laparoscopic low anterior resection N/A 06/29/2012    Procedure: LAPAROSCOPIC LOW ANTERIOR RESECTION, mobilization splenic flexure,coloanal anastomosis,diverting ileostomy;  Surgeon: Leighton Ruff, MD;  Location: [facility];  Service: General;  Laterality: N/A;  . Ileostomy closure N/A 12/07/2012    Procedure: ILEOSTOMY TAKEDOWN;  Surgeon: Leighton Ruff, MD;  Location: [facility];  Service: General;  Laterality: N/A;  . Portacath placement Left 03/21/2013    Procedure: INSERTION PORT-A-CATH;  Surgeon: Leighton Ruff, MD;  Location: [facility];  Service: General;  Laterality: Left;    Current Outpatient Prescriptions  Medication Sig Dispense Refill  . ALPRAZolam (XANAX) 0.25 MG tablet Take 1 tablet (0.25 mg total) by mouth 2 (two) times daily as needed for sleep or anxiety.  60 tablet  1  . B Complex-C (B-COMPLEX WITH VITAMIN C) tablet Take 1 tablet by mouth daily.      Marland Kitchen docusate sodium (COLACE) 100 MG capsule Take 100 mg by mouth daily. Occasionally bid      . lidocaine-prilocaine (EMLA) cream Apply 1 application topically  as needed. Apply to [facility] site 1-2 hours prior to stick and cover with plastic wrap  30 g  prn  . 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      . Multiple Vitamin (MULTIVITAMIN) tablet Take 1 tablet by mouth daily. Men's one-a-day formulation.      . potassium chloride SA (K-DUR,KLOR-CON) 20 MEQ tablet Take 1 tablet (20 mEq total) by mouth daily.  30 tablet  2  . Probiotic Product (PROBIOTIC DAILY PO) Take 1 tablet by mouth daily.      . prochlorperazine (COMPAZINE) 10 MG tablet Take 10 mg by mouth every 6 (six) hours as needed for nausea or vomiting.       No  current facility-administered medications for this visit.   Facility-Administered Medications Ordered in Other Visits  Medication Dose Route Frequency Provider Last Rate Last Dose  . sodium chloride 0.9 % injection 10 mL  10 mL Intracatheter PRN Ladell Pier, MD   10 mL at 07/07/13 1326     Allergies  Allergen Reactions  . Penicillins Rash    Does not recall     Family History  Problem Relation Age of Onset  . Adopted: Yes  . Cancer Father   . Heart disease Father   . Colon cancer Neg Hx      History   Social History  . Marital Status: Married    Spouse Name: N/A    Number of Children: 1  . Years of Education: N/A   Occupational History  . Scultor/Professor     [employer]   Social History Main Topics  . Smoking status: Never Smoker   . Smokeless tobacco: Never Used  . Alcohol Use: Yes     Comment: RARE   . Drug Use: No     Comment: marijuana  . Sexual Activity: Yes    Partners: Female   Other Topics Concern  . None   Social History Narrative   Married to wife, [name]   Has one 80 year old child   Occupation: Emergency planning/management officer at [employer]: 12 point review of systems negative other than HPI and PMH  EXAM: Filed Vitals:   08/28/13 1510  BP: 125/75  Pulse: 75  Temp: 98.4 F (36.9 C)  Resp: 16    Wt Readings from Last 3 Encounters:  08/28/13 179 lb (81.194 kg)  08/23/13 178 lb 3.2 oz (80.831 kg)  08/16/13 175 lb 12.8 oz (79.742 kg)     Gen:  No acute distress.  Well nourished and well groomed.   Neurological: Alert and oriented to person, place, and time. Coordination normal.  Head: Normocephalic and atraumatic.  Eyes: Conjunctivae are normal. Pupils are equal, round, and reactive to light. No scleral icterus.  Neck: Normal range of motion. Neck supple. No tracheal deviation or thyromegaly present.  Cardiovascular: Normal rate, regular rhythm, normal heart sounds and intact distal pulses.  Exam  reveals no gallop and no friction rub.  No murmur heard. Respiratory: Effort normal.  No respiratory distress. No chest wall tenderness. Breath sounds normal.  No wheezes, rales or rhonchi.  GI: Soft. Bowel sounds are normal. The abdomen is soft and nontender.  There is no rebound and no guarding. Pt has scars from lap LAR and ileostomy Musculoskeletal: Normal range of motion. Extremities are nontender.  Lymphadenopathy: No cervical, preauricular, postauricular or  axillary adenopathy is present Skin: Skin is warm and dry. No rash noted. No diaphoresis. No erythema. No pallor. No clubbing, cyanosis, or edema.   Psychiatric: Normal mood and affect. Behavior is normal. Judgment and thought content normal.    LABORATORY RESULTS: Available labs are reviewed   Recent Results (from the past 2160 hour(s))  MAGNESIUM (CC13)     Status: None   Collection Time    06/07/13  9:45 AM      Result Value Ref Range   Magnesium 2.4  1.5 - 2.5 mg/dl  COMPREHENSIVE METABOLIC PANEL (VV61)     Status: None   Collection Time    06/07/13  9:45 AM      Result Value Ref Range   Sodium 144  136 - 145 mEq/L   Potassium 3.7  3.5 - 5.1 mEq/L   Chloride 109  98 - 109 mEq/L   CO2 26  22 - 29 mEq/L   Glucose 94  70 - 140 mg/dl   BUN 12.4  7.0 - 26.0 mg/dL   Creatinine 1.0  0.7 - 1.3 mg/dL   Total Bilirubin 0.32  0.20 - 1.20 mg/dL   Alkaline Phosphatase 81  40 - 150 U/L   AST 31  5 - 34 U/L   ALT 50  0 - 55 U/L   Total Protein 6.5  6.4 - 8.3 g/dL   Albumin 3.6  3.5 - 5.0 g/dL   Calcium 9.2  8.4 - 10.4 mg/dL   Anion Gap 10  3 - 11 mEq/L  CBC WITH DIFFERENTIAL     Status: Abnormal   Collection Time    06/07/13  9:45 AM      Result Value Ref Range   WBC 5.3  4.0 - 10.3 10e3/uL   NEUT# 4.3  1.5 - 6.5 10e3/uL   HGB 12.6 (*) 13.0 - 17.1 g/dL   HCT 38.2 (*) 38.4 - 49.9 %   Platelets 116 (*) 140 - 400 10e3/uL   MCV 91.3  79.3 - 98.0 fL   MCH 30.2  27.2 - 33.4 pg   MCHC 33.1  32.0 - 36.0 g/dL   RBC 4.18 (*)  4.20 - 5.82 10e6/uL   RDW 16.0 (*) 11.0 - 14.6 %   lymph# 0.5 (*) 0.9 - 3.3 10e3/uL   MONO# 0.5  0.1 - 0.9 10e3/uL   Eosinophils Absolute 0.0  0.0 - 0.5 10e3/uL   Basophils Absolute 0.0  0.0 - 0.1 10e3/uL   NEUT% 81.3 (*) 39.0 - 75.0 %   LYMPH% 8.9 (*) 14.0 - 49.0 %   MONO% 8.7  0.0 - 14.0 %   EOS% 0.7  0.0 - 7.0 %   BASO% 0.4  0.0 - 2.0 %  UA PROTEIN, DIPSTICK - [facility]     Status: None   Collection Time    06/07/13  9:45 AM      Result Value Ref Range   Protein, ur Negative  Negative- <30 mg/dL  CBC WITH DIFFERENTIAL     Status: Abnormal   Collection Time    06/21/13  9:30 AM      Result Value Ref Range   WBC 4.8  4.0 - 10.3 10e3/uL   NEUT# 3.7  1.5 - 6.5 10e3/uL   HGB 12.5 (*) 13.0 - 17.1 g/dL   HCT 38.0 (*) 38.4 - 49.9 %   Platelets 109 (*) 140 - 400 10e3/uL   MCV 93.4  79.3 - 98.0 fL   MCH 30.7  27.2 -  33.4 pg   MCHC 32.9  32.0 - 36.0 g/dL   RBC 4.07 (*) 4.20 - 5.82 10e6/uL   RDW 16.2 (*) 11.0 - 14.6 %   lymph# 0.6 (*) 0.9 - 3.3 10e3/uL   MONO# 0.5  0.1 - 0.9 10e3/uL   Eosinophils Absolute 0.0  0.0 - 0.5 10e3/uL   Basophils Absolute 0.0  0.0 - 0.1 10e3/uL   NEUT% 77.1 (*) 39.0 - 75.0 %   LYMPH% 11.9 (*) 14.0 - 49.0 %   MONO% 10.0  0.0 - 14.0 %   EOS% 0.8  0.0 - 7.0 %   BASO% 0.2  0.0 - 2.0 %  COMPREHENSIVE METABOLIC PANEL (YW73)     Status: Abnormal   Collection Time    06/21/13  9:30 AM      Result Value Ref Range   Sodium 144  136 - 145 mEq/L   Potassium 3.4 (*) 3.5 - 5.1 mEq/L   Chloride 108  98 - 109 mEq/L   CO2 24  22 - 29 mEq/L   Glucose 89  70 - 140 mg/dl   BUN 12.9  7.0 - 26.0 mg/dL   Creatinine 0.8  0.7 - 1.3 mg/dL   Total Bilirubin 0.21  0.20 - 1.20 mg/dL   Alkaline Phosphatase 78  40 - 150 U/L   AST 27  5 - 34 U/L   ALT 49  0 - 55 U/L   Total Protein 6.4  6.4 - 8.3 g/dL   Albumin 3.6  3.5 - 5.0 g/dL   Calcium 8.9  8.4 - 10.4 mg/dL   Anion Gap 12 (*) 3 - 11 mEq/L  MAGNESIUM (CC13)     Status: Abnormal   Collection Time    06/21/13  9:30 AM       Result Value Ref Range   Magnesium 2.6 (*) 1.5 - 2.5 mg/dl  CEA     Status: None   Collection Time    06/21/13  9:30 AM      Result Value Ref Range   CEA 1.8  0.0 - 5.0 ng/mL  [facility]     Status: None   Collection Time    06/21/13  9:30 AM      Result Value Ref Range   [facility] Collected.    CBC WITH DIFFERENTIAL     Status: Abnormal   Collection Time    07/05/13 10:25 AM      Result Value Ref Range   WBC 3.4 (*) 4.0 - 10.3 10e3/uL   NEUT# 2.6  1.5 - 6.5 10e3/uL   HGB 12.5 (*) 13.0 - 17.1 g/dL   HCT 37.8 (*) 38.4 - 49.9 %   Platelets 107 (*) 140 - 400 10e3/uL   MCV 93.0  79.3 - 98.0 fL   MCH 30.7  27.2 - 33.4 pg   MCHC 33.0  32.0 - 36.0 g/dL   RBC 4.07 (*) 4.20 - 5.82 10e6/uL   RDW 17.6 (*) 11.0 - 14.6 %   lymph# 0.4 (*) 0.9 - 3.3 10e3/uL   MONO# 0.3  0.1 - 0.9 10e3/uL   Eosinophils Absolute 0.0  0.0 - 0.5 10e3/uL   Basophils Absolute 0.0  0.0 - 0.1 10e3/uL   NEUT% 76.8 (*) 39.0 - 75.0 %   LYMPH% 12.8 (*) 14.0 - 49.0 %   MONO% 9.1  0.0 - 14.0 %   EOS% 0.8  0.0 - 7.0 %   BASO% 0.5  0.0 - 2.0 %  MAGNESIUM (CC13)  Status: None   Collection Time    07/05/13 10:25 AM      Result Value Ref Range   Magnesium 2.3  1.5 - 2.5 mg/dl  COMPREHENSIVE METABOLIC PANEL (FM38)     Status: Abnormal   Collection Time    07/05/13 10:25 AM      Result Value Ref Range   Sodium 143  136 - 145 mEq/L   Potassium 3.6  3.5 - 5.1 mEq/L   Chloride 111 (*) 98 - 109 mEq/L   CO2 24  22 - 29 mEq/L   Glucose 126  70 - 140 mg/dl   BUN 13.0  7.0 - 26.0 mg/dL   Creatinine 0.9  0.7 - 1.3 mg/dL   Total Bilirubin 0.26  0.20 - 1.20 mg/dL   Alkaline Phosphatase 74  40 - 150 U/L   AST 35 (*) 5 - 34 U/L   ALT 58 (*) 0 - 55 U/L   Total Protein 6.2 (*) 6.4 - 8.3 g/dL   Albumin 3.6  3.5 - 5.0 g/dL   Calcium 8.7  8.4 - 10.4 mg/dL   Anion Gap 8  3 - 11 mEq/L  UA PROTEIN, DIPSTICK - [facility]     Status: None   Collection Time    07/05/13 10:25 AM      Result Value Ref Range   Protein, ur < 30   Negative- <30 mg/dL  CBC WITH DIFFERENTIAL     Status: Abnormal   Collection Time    07/19/13  9:32 AM      Result Value Ref Range   WBC 4.1  4.0 - 10.3 10e3/uL   NEUT# 3.3  1.5 - 6.5 10e3/uL   HGB 11.9 (*) 13.0 - 17.1 g/dL   HCT 36.5 (*) 38.4 - 49.9 %   Platelets 122 (*) 140 - 400 10e3/uL   MCV 93.8  79.3 - 98.0 fL   MCH 30.6  27.2 - 33.4 pg   MCHC 32.6  32.0 - 36.0 g/dL   RBC 3.89 (*) 4.20 - 5.82 10e6/uL   RDW 17.1 (*) 11.0 - 14.6 %   lymph# 0.4 (*) 0.9 - 3.3 10e3/uL   MONO# 0.4  0.1 - 0.9 10e3/uL   Eosinophils Absolute 0.0  0.0 - 0.5 10e3/uL   Basophils Absolute 0.0  0.0 - 0.1 10e3/uL   NEUT% 79.4 (*) 39.0 - 75.0 %   LYMPH% 10.0 (*) 14.0 - 49.0 %   MONO% 9.5  0.0 - 14.0 %   EOS% 0.7  0.0 - 7.0 %   BASO% 0.4  0.0 - 2.0 %  COMPREHENSIVE METABOLIC PANEL (GY65)     Status: Abnormal   Collection Time    07/19/13  9:33 AM      Result Value Ref Range   Sodium 144  136 - 145 mEq/L   Potassium 3.9  3.5 - 5.1 mEq/L   Chloride 109  98 - 109 mEq/L   CO2 24  22 - 29 mEq/L   Glucose 82  70 - 140 mg/dl   BUN 12.6  7.0 - 26.0 mg/dL   Creatinine 0.9  0.7 - 1.3 mg/dL   Total Bilirubin 0.26  0.20 - 1.20 mg/dL   Alkaline Phosphatase 72  40 - 150 U/L   AST 28  5 - 34 U/L   ALT 53  0 - 55 U/L   Total Protein 6.3 (*) 6.4 - 8.3 g/dL   Albumin 3.5  3.5 - 5.0 g/dL   Calcium 9.2  8.4 - 10.4 mg/dL   Anion Gap 10  3 - 11 mEq/L  MAGNESIUM (CC13)     Status: None   Collection Time    07/19/13  9:33 AM      Result Value Ref Range   Magnesium 2.3  1.5 - 2.5 mg/dl  COMPREHENSIVE METABOLIC PANEL (DU20)     Status: Abnormal   Collection Time    08/02/13  9:33 AM      Result Value Ref Range   Sodium 146 (*) 136 - 145 mEq/L   Potassium 4.2  3.5 - 5.1 mEq/L   Chloride 110 (*) 98 - 109 mEq/L   CO2 24  22 - 29 mEq/L   Glucose 82  70 - 140 mg/dl   BUN 12.1  7.0 - 26.0 mg/dL   Creatinine 0.9  0.7 - 1.3 mg/dL   Total Bilirubin 0.22  0.20 - 1.20 mg/dL   Alkaline Phosphatase 67  40 - 150 U/L   AST  31  5 - 34 U/L   ALT 43  0 - 55 U/L   Total Protein 6.0 (*) 6.4 - 8.3 g/dL   Albumin 3.4 (*) 3.5 - 5.0 g/dL   Calcium 9.1  8.4 - 10.4 mg/dL   Anion Gap 12 (*) 3 - 11 mEq/L  MAGNESIUM (CC13)     Status: None   Collection Time    08/02/13  9:33 AM      Result Value Ref Range   Magnesium 2.5  1.5 - 2.5 mg/dl  CBC WITH DIFFERENTIAL     Status: Abnormal   Collection Time    08/02/13  9:34 AM      Result Value Ref Range   WBC 6.2  4.0 - 10.3 10e3/uL   NEUT# 5.1  1.5 - 6.5 10e3/uL   HGB 11.7 (*) 13.0 - 17.1 g/dL   HCT 35.5 (*) 38.4 - 49.9 %   Platelets 98 (*) 140 - 400 10e3/uL   MCV 93.8  79.3 - 98.0 fL   MCH 30.8  27.2 - 33.4 pg   MCHC 32.8  32.0 - 36.0 g/dL   RBC 3.78 (*) 4.20 - 5.82 10e6/uL   RDW 17.7 (*) 11.0 - 14.6 %   lymph# 0.4 (*) 0.9 - 3.3 10e3/uL   MONO# 0.6  0.1 - 0.9 10e3/uL   Eosinophils Absolute 0.0  0.0 - 0.5 10e3/uL   Basophils Absolute 0.1  0.0 - 0.1 10e3/uL   NEUT% 82.7 (*) 39.0 - 75.0 %   LYMPH% 6.8 (*) 14.0 - 49.0 %   MONO% 9.0  0.0 - 14.0 %   EOS% 0.5  0.0 - 7.0 %   BASO% 1.0  0.0 - 2.0 %  UA PROTEIN, DIPSTICK - [facility]     Status: None   Collection Time    08/02/13  9:34 AM      Result Value Ref Range   Protein, ur Negative  Negative- <30 mg/dL  CBC WITH DIFFERENTIAL     Status: Abnormal   Collection Time    08/16/13 10:08 AM      Result Value Ref Range   WBC 5.8  4.0 - 10.3 10e3/uL   NEUT# 4.8  1.5 - 6.5 10e3/uL   HGB 11.4 (*) 13.0 - 17.1 g/dL   HCT 35.0 (*) 38.4 - 49.9 %   Platelets 125 (*) 140 - 400 10e3/uL   MCV 94.5  79.3 - 98.0 fL   MCH 30.7  27.2 - 33.4 pg   MCHC  32.5  32.0 - 36.0 g/dL   RBC 3.70 (*) 4.20 - 5.82 10e6/uL   RDW 18.3 (*) 11.0 - 14.6 %   lymph# 0.4 (*) 0.9 - 3.3 10e3/uL   MONO# 0.5  0.1 - 0.9 10e3/uL   Eosinophils Absolute 0.0  0.0 - 0.5 10e3/uL   Basophils Absolute 0.0  0.0 - 0.1 10e3/uL   NEUT% 83.5 (*) 39.0 - 75.0 %   LYMPH% 6.9 (*) 14.0 - 49.0 %   MONO% 8.2  0.0 - 14.0 %   EOS% 0.7  0.0 - 7.0 %   BASO% 0.7  0.0 - 2.0 %   [facility]     Status: None   Collection Time    08/16/13 10:08 AM      Result Value Ref Range   [facility] Collected.    COMPREHENSIVE METABOLIC PANEL (ZO10)     Status: Abnormal   Collection Time    08/16/13 10:08 AM      Result Value Ref Range   Sodium 141  136 - 145 mEq/L   Potassium 3.7  3.5 - 5.1 mEq/L   Chloride 109  98 - 109 mEq/L   CO2 22  22 - 29 mEq/L   Glucose 99  70 - 140 mg/dl   BUN 11.7  7.0 - 26.0 mg/dL   Creatinine 0.8  0.7 - 1.3 mg/dL   Total Bilirubin 0.23  0.20 - 1.20 mg/dL   Alkaline Phosphatase 70  40 - 150 U/L   AST 26  5 - 34 U/L   ALT 38  0 - 55 U/L   Total Protein 6.1 (*) 6.4 - 8.3 g/dL   Albumin 3.4 (*) 3.5 - 5.0 g/dL   Calcium 9.0  8.4 - 10.4 mg/dL   Anion Gap 10  3 - 11 mEq/L  MAGNESIUM (CC13)     Status: None   Collection Time    08/16/13 10:08 AM      Result Value Ref Range   Magnesium 2.2  1.5 - 2.5 mg/dl  CEA     Status: None   Collection Time    08/16/13 10:09 AM      Result Value Ref Range   CEA 1.9  0.0 - 5.0 ng/mL     RADIOLOGY RESULTS: See E-Chart or I-Site for most recent results.  Images and reports are reviewed.  Ct Chest W Contrast  08/14/2013   CLINICAL DATA:  Restaging rectal cancer. History of colon resection and metastatic disease. Chemotherapy and radiation therapy completed.  EXAM: CT CHEST, ABDOMEN, AND PELVIS WITH CONTRAST  TECHNIQUE: Multidetector CT imaging of the chest, abdomen and pelvis was performed following the standard protocol during bolus administration of intravenous contrast.  CONTRAST:  112mL OMNIPAQUE IOHEXOL 300 MG/ML  SOLN  COMPARISON:  Prior examinations 06/20/2013 (CT) and abdominal MRI 04/11/2013.  FINDINGS: RECIST 1.1  Target Lesions:  1. Lesion in the dome of the right hepatic lobe measures 1.4 cm on image 43 (previously 1.7 cm). 2. Subcapsular lesion in the posterior segment of the right hepatic lobe measures 5 mm on image 49 (previously 6 mm). Non-target Lesions:  1. The previously demonstrated  cavitary left lower lobe pulmonary nodule is subjectively smaller, but measures 4 mm on image 32 (4 mm previously). 2. See below.  CT CHEST FINDINGS  Left subclavian Port-A-Cath tip is in the lower SVC. There are no enlarged mediastinal, hilar or axillary lymph nodes. The thyroid gland and thoracic inlet appear normal.  There is no pleural  or pericardial effusion. 4 mm cavitary nodule in the left lower lobe on image 32 is subjectively slightly smaller. Tiny subpleural left lower lobe nodule on image 36 is now nearly inconspicuous. No new or enlarging pulmonary nodules are identified. There is no confluent airspace opacity or endobronchial lesion.  There are no worrisome osseous findings.  CT ABDOMEN AND PELVIS FINDINGS  The dominant metastasis involving the dome of the right hepatic lobe has slightly decreased in size, measuring 1.4 cm on image 43 (previously 1.7 cm. Cystic lesion inferiorly in the right hepatic lobe measures 11 mm (9 mm previously). This did not appear to reflect a metastasis on MRI. Other small lesions are stable.  The spleen, gallbladder, pancreas, adrenal glands and kidneys appear normal.  Small retroperitoneal lymph nodes are stable. There is no adenopathy, ascites or peritoneal nodularity. There are no significant vascular findings.  There is moderate stool throughout the colon. No focal mucosal lesions are identified. Postsurgical changes within the anterior abdominal wall and presacral fibrotic changes are stable. The bladder, prostate gland and seminal vesicles appear stable.  There are no worrisome osseous findings.  IMPRESSION: 1. Continued response to therapy in hepatic metastatic disease. 2. The cavitary left lower lobe pulmonary nodule also appears subjectively smaller. 3. No disease progression identified. 4. Stable presacral fibrotic changes.   Electronically Signed   By: Camie Patience M.D.   On: 08/14/2013 09:19   Ct Abdomen Pelvis W Contrast  08/14/2013   CLINICAL DATA:   Restaging rectal cancer. History of colon resection and metastatic disease. Chemotherapy and radiation therapy completed.  EXAM: CT CHEST, ABDOMEN, AND PELVIS WITH CONTRAST  TECHNIQUE: Multidetector CT imaging of the chest, abdomen and pelvis was performed following the standard protocol during bolus administration of intravenous contrast.  CONTRAST:  154mL OMNIPAQUE IOHEXOL 300 MG/ML  SOLN  COMPARISON:  Prior examinations 06/20/2013 (CT) and abdominal MRI 04/11/2013.  FINDINGS: RECIST 1.1  Target Lesions:  1. Lesion in the dome of the right hepatic lobe measures 1.4 cm on image 43 (previously 1.7 cm). 2. Subcapsular lesion in the posterior segment of the right hepatic lobe measures 5 mm on image 49 (previously 6 mm). Non-target Lesions:  1. The previously demonstrated cavitary left lower lobe pulmonary nodule is subjectively smaller, but measures 4 mm on image 32 (4 mm previously). 2. See below.  CT CHEST FINDINGS  Left subclavian Port-A-Cath tip is in the lower SVC. There are no enlarged mediastinal, hilar or axillary lymph nodes. The thyroid gland and thoracic inlet appear normal.  There is no pleural or pericardial effusion. 4 mm cavitary nodule in the left lower lobe on image 32 is subjectively slightly smaller. Tiny subpleural left lower lobe nodule on image 36 is now nearly inconspicuous. No new or enlarging pulmonary nodules are identified. There is no confluent airspace opacity or endobronchial lesion.  There are no worrisome osseous findings.  CT ABDOMEN AND PELVIS FINDINGS  The dominant metastasis involving the dome of the right hepatic lobe has slightly decreased in size, measuring 1.4 cm on image 43 (previously 1.7 cm. Cystic lesion inferiorly in the right hepatic lobe measures 11 mm (9 mm previously). This did not appear to reflect a metastasis on MRI. Other small lesions are stable.  The spleen, gallbladder, pancreas, adrenal glands and kidneys appear normal.  Small retroperitoneal lymph nodes are  stable. There is no adenopathy, ascites or peritoneal nodularity. There are no significant vascular findings.  There is moderate stool throughout the colon. No focal mucosal lesions  are identified. Postsurgical changes within the anterior abdominal wall and presacral fibrotic changes are stable. The bladder, prostate gland and seminal vesicles appear stable.  There are no worrisome osseous findings.  IMPRESSION: 1. Continued response to therapy in hepatic metastatic disease. 2. The cavitary left lower lobe pulmonary nodule also appears subjectively smaller. 3. No disease progression identified. 4. Stable presacral fibrotic changes.   Electronically Signed   By: Camie Patience M.D.   On: 08/14/2013 09:19      ASSESSMENT AND PLAN: Rectal cancer, metastatic to liver Patient appears to have right-sided only disease.  He is a good candidate for a right hepatectomy. His left lobe appears to be adequately sized to perform surgery without a portal vein embolization.  He has an isolated left lung lesion that Dr. Servando Snare of thoracic surgery is going to consult for.  Unless Dr. Servando Snare is going to be doing a wedge resection, I do not think that I would do these at the same time.  We'll discuss this following his consult.  I am going to set him up for a diagnostic laparoscopy, liver ultrasound, and partial hepatectomy.  I discussed the risk of surgery with the patient.  The procedure was discussed with the patient.  The alternatives were discussed, including ablation, chemotherapy, Y 90 infusion, and doing nothing.  The patient elects to undergo partial hepatectomy.  The procedure was described to the patient including the incision, surgical technique, goals of surgery, post op period and recovery time.  We discussed lifting and driving restrictions as well as time off work.  The complications were described to the patient.  The complications below were discussed, as well as the fact that unpredicted  complications could occur.     Bleeding, possibly requiring a blood transfusion. Infection and possible wound complications such as hernia Possible abortion of the operation due to cirrhosis. Damage to adjacent structures Leak of bile from the surface of the liver Possible need for other procedures, such as abscess drains in radiology or endoscopy.   Possible prolonged hospital stay Possible need for ICU stay including time on the ventilator. Decreased energy level.    Possible early recurrence of cancer Possible complications of your medical problems such as heart disease or arrhythmias. Death (less than 2%)        Milus Height MD Surgical Oncology, [facility] and [facility]   Visit Diagnoses: 1. Rectal cancer, metastatic to liver     Primary Care Physician: Penni Homans, MD

## 2013-09-18 ENCOUNTER — Encounter (HOSPITAL_COMMUNITY): Payer: Self-pay | Admitting: General Surgery

## 2013-09-18 ENCOUNTER — Ambulatory Visit: Payer: BC Managed Care – PPO | Admitting: Nurse Practitioner

## 2013-09-18 LAB — COMPREHENSIVE METABOLIC PANEL
ALBUMIN: 3.2 g/dL — AB (ref 3.5–5.2)
ALK PHOS: 45 U/L (ref 39–117)
ALT: 772 U/L — AB (ref 0–53)
AST: 536 U/L — AB (ref 0–37)
BUN: 17 mg/dL (ref 6–23)
CALCIUM: 8.1 mg/dL — AB (ref 8.4–10.5)
CO2: 22 mEq/L (ref 19–32)
Chloride: 103 mEq/L (ref 96–112)
Creatinine, Ser: 0.98 mg/dL (ref 0.50–1.35)
GFR calc Af Amer: >90 mL/min (ref >90)
GFR calc non Af Amer: >90 mL/min (ref >90)
GLUCOSE: 118 mg/dL — AB (ref 70–99)
POTASSIUM: 4.2 meq/L (ref 3.7–5.3)
SODIUM: 138 meq/L (ref 137–147)
Total Bilirubin: 3 mg/dL — ABNORMAL HIGH (ref 0.3–1.2)
Total Protein: 5.2 g/dL — ABNORMAL LOW (ref 6.0–8.3)

## 2013-09-18 LAB — PHOSPHORUS: Phosphorus: 3.4 mg/dL (ref 2.3–4.6)

## 2013-09-18 LAB — MAGNESIUM: Magnesium: 1.7 mg/dL (ref 1.5–2.5)

## 2013-09-18 LAB — CBC
HCT: 36.7 % — ABNORMAL LOW (ref 39.0–52.0)
HEMOGLOBIN: 11.8 g/dL — AB (ref 13.0–17.0)
MCH: 30.5 pg (ref 26.0–34.0)
MCHC: 32.2 g/dL (ref 30.0–36.0)
MCV: 94.8 fL (ref 78.0–100.0)
PLATELETS: 88 10*3/uL — AB (ref 150–400)
RBC: 3.87 MIL/uL — ABNORMAL LOW (ref 4.22–5.81)
RDW: 15.9 % — AB (ref 11.5–15.5)
WBC: 10 10*3/uL (ref 4.0–10.5)

## 2013-09-18 MED ORDER — ALBUMIN HUMAN 5 % IV SOLN
25.0000 g | Freq: Once | INTRAVENOUS | Status: AC
  Administered 2013-09-18: 25 g via INTRAVENOUS
  Filled 2013-09-18: qty 250
  Filled 2013-09-18: qty 500

## 2013-09-18 MED ORDER — LACTATED RINGERS IV BOLUS (SEPSIS)
1000.0000 mL | Freq: Once | INTRAVENOUS | Status: AC
  Administered 2013-09-18: 1000 mL via INTRAVENOUS

## 2013-09-18 MED ORDER — PNEUMOCOCCAL VAC POLYVALENT 25 MCG/0.5ML IJ INJ: 0.5000 mL | INJECTION | INTRAMUSCULAR | Status: DC | PRN

## 2013-09-18 MED ORDER — ACETAMINOPHEN 10 MG/ML IV SOLN
1000.0000 mg | Freq: Three times a day (TID) | INTRAVENOUS | Status: AC
  Administered 2013-09-18 – 2013-09-19 (×3): 1000 mg via INTRAVENOUS
  Filled 2013-09-18 (×4): qty 100

## 2013-09-18 MED ORDER — PROMETHAZINE HCL 25 MG/ML IJ SOLN
6.2500 mg | Freq: Four times a day (QID) | INTRAMUSCULAR | Status: DC | PRN
  Administered 2013-09-20: 6.25 mg via INTRAVENOUS
  Filled 2013-09-18: qty 1

## 2013-09-18 NOTE — Progress Notes (Signed)
Patient states that it is time for his port-a-cath to be flushed, and he missed his scheduled appointment.  Spoke with Dr. Barry Dienes; Dr. Barry Dienes states it is okay to flush port.  IV therapy contacted; pt is on list to have port flushed.

## 2013-09-18 NOTE — Progress Notes (Signed)
1 Day Post-Op  Subjective: NGT wasn't working, but now put out around 600-700 mL bilious material.  Had a few episodes where he fell asleep and got behind on pain meds.  UOP marginal 25-35/hr.    Objective: Vital signs in last 24 hours: Temp:  [97.4 F (36.3 C)-98.2 F (36.8 C)] 98 F (36.7 C) (06/30 0800) Pulse Rate:  [88-120] 102 (06/30 0900) Resp:  [13-34] 13 (06/30 0900) BP: (117-151)/(67-98) 121/82 mmHg (06/30 0800) SpO2:  [95 %-100 %] 98 % (06/30 0900) Arterial Line BP: (101-168)/(60-85) 133/60 mmHg (06/30 0900)    Intake/Output from previous day: 06/29 0701 - 06/30 0700 In: 4331.5 [I.V.:3441.5; NG/GT:90; IV Piggyback:700] Out: 1470 [Urine:805; Drains:315; Blood:350] Intake/Output this shift: Total I/O In: 210.5 [I.V.:210.5] Out: 600 [Urine:70; Emesis/NG output:500; Drains:30]  General appearance: alert, cooperative and mild distress Resp: breathing comfortably Cardio: reg rhythm, tachycardic GI: soft, approp tender, mildly distended, JP serosang  Lab Results:   Recent Labs  09/17/13 1233 09/18/13 0420  WBC 14.3* 10.0  HGB 11.5* 11.8*  HCT 35.8* 36.7*  PLT 224 88*   BMET  Recent Labs  09/17/13 1430 09/18/13 0420  NA  --  138  K  --  4.2  CL  --  103  CO2  --  22  GLUCOSE  --  118*  BUN  --  17  CREATININE 0.88 0.98  CALCIUM  --  8.1*   PT/INR No results found for this basename: LABPROT, INR,  in the last 72 hours ABG No results found for this basename: PHART, PCO2, PO2, HCO3,  in the last 72 hours  Studies/Results: Dg Chest Portable 1 View  09/17/2013   CLINICAL DATA:  Status post line placement  EXAM: PORTABLE CHEST - 1 VIEW  COMPARISON:  CT scan of the chest dated Aug 14, 2013  FINDINGS: The right internal jugular venous catheter tip lies in the midportion of the SVC. The adjacent left power port appliance catheter tip is at the same level. The lungs are mildly hypoinflated. There is subdiaphragmatic gas on the right consistent with the  patient's recent intra-abdominal surgery. No pleural line in the apex of the right lung is demonstrated. The esophagogastric tube proximal port lies below the GE junction with the tip projecting off the image. The cardiac silhouette is top-normal in size.  IMPRESSION: There is no postprocedure complication following placement of the right internal jugular venous catheter.  These results were called by telephone at the time of interpretation on 09/17/2013 at 1:11 PM to [facility], [place], who verbally acknowledged these results.   Electronically Signed   By: David  Martinique   On: 09/17/2013 13:11    Anti-infectives: Anti-infectives   Start     Dose/Rate Route Frequency Ordered Stop   09/17/13 2000  ciprofloxacin (CIPRO) IVPB 400 mg     400 mg 200 mL/hr over 60 Minutes Intravenous Every 12 hours 09/17/13 1416 09/17/13 2044   09/17/13 0600  ciprofloxacin (CIPRO) IVPB 400 mg  Status:  Discontinued     400 mg 200 mL/hr over 60 Minutes Intravenous On call to O.R. 09/16/13 1307 09/17/13 1416      Assessment/Plan: s/p Procedure(s): OPEN HEPATECTOMY (N/A) LAPAROSCOPY DIAGNOSTIC (N/A) LIVER ULTRASOUND (N/A) CHOLECYSTECTOMY (N/A) Continue foley due to urinary output monitoring Fluid for hypovolemia (CVP 7) D/c art line Pain control.  Add ofirmev for pain control.   Leave NGT. OOB.  LOS: 1 day    BYERLY,FAERA 09/18/2013

## 2013-09-19 LAB — CBC
HCT: 29.5 % — ABNORMAL LOW (ref 39.0–52.0)
HEMOGLOBIN: 9.6 g/dL — AB (ref 13.0–17.0)
MCH: 31.4 pg (ref 26.0–34.0)
MCHC: 32.5 g/dL (ref 30.0–36.0)
MCV: 96.4 fL (ref 78.0–100.0)
PLATELETS: 58 10*3/uL — AB (ref 150–400)
RBC: 3.06 MIL/uL — ABNORMAL LOW (ref 4.22–5.81)
RDW: 16.1 % — ABNORMAL HIGH (ref 11.5–15.5)
WBC: 7.5 10*3/uL (ref 4.0–10.5)

## 2013-09-19 LAB — POCT I-STAT 7, (LYTES, BLD GAS, ICA,H+H)
ACID-BASE DEFICIT: 1 mmol/L (ref 0.0–2.0)
BICARBONATE: 25.5 meq/L — AB (ref 20.0–24.0)
BICARBONATE: 24.3 meq/L — AB (ref 20.0–24.0)
Calcium, Ion: 1.16 mmol/L (ref 1.12–1.23)
Calcium, Ion: 1.12 mmol/L (ref 1.12–1.23)
HCT: 35 % — ABNORMAL LOW (ref 39.0–52.0)
HCT: 33 % — ABNORMAL LOW (ref 39.0–52.0)
Hemoglobin: 11.9 g/dL — ABNORMAL LOW (ref 13.0–17.0)
Hemoglobin: 11.2 g/dL — ABNORMAL LOW (ref 13.0–17.0)
O2 SAT: 100 %
O2 SAT: 100 %
PCO2 ART: 38.2 mmHg (ref 35.0–45.0)
PO2 ART: 245 mmHg — AB (ref 80.0–100.0)
PO2 ART: 266 mmHg — AB (ref 80.0–100.0)
POTASSIUM: 3.2 meq/L — AB (ref 3.7–5.3)
Patient temperature: 35.5
Potassium: 3.5 mEq/L — ABNORMAL LOW (ref 3.7–5.3)
SODIUM: 138 meq/L (ref 137–147)
Sodium: 138 mEq/L (ref 137–147)
TCO2: 27 mmol/L (ref 0–100)
TCO2: 26 mmol/L (ref 0–100)
pCO2 arterial: 44.6 mmHg (ref 35.0–45.0)
pH, Arterial: 7.366 (ref 7.350–7.450)
pH, Arterial: 7.405 (ref 7.350–7.450)

## 2013-09-19 LAB — COMPREHENSIVE METABOLIC PANEL
ALBUMIN: 3.1 g/dL — AB (ref 3.5–5.2)
ALK PHOS: 40 U/L (ref 39–117)
ALT: 513 U/L — AB (ref 0–53)
AST: 265 U/L — AB (ref 0–37)
BUN: 18 mg/dL (ref 6–23)
CALCIUM: 8.2 mg/dL — AB (ref 8.4–10.5)
CO2: 26 mEq/L (ref 19–32)
Chloride: 102 mEq/L (ref 96–112)
Creatinine, Ser: 1.09 mg/dL (ref 0.50–1.35)
GFR calc Af Amer: >90 mL/min (ref >90)
GFR calc non Af Amer: 82 mL/min — ABNORMAL LOW (ref >90)
Glucose, Bld: 103 mg/dL — ABNORMAL HIGH (ref 70–99)
Potassium: 3.9 mEq/L (ref 3.7–5.3)
SODIUM: 138 meq/L (ref 137–147)
TOTAL PROTEIN: 4.7 g/dL — AB (ref 6.0–8.3)
Total Bilirubin: 3.8 mg/dL — ABNORMAL HIGH (ref 0.3–1.2)

## 2013-09-19 NOTE — Progress Notes (Signed)
Patient ID: Jacob [name], male   DOB: 12-05-1970, 43 y.o.   MRN: [id number] 2 Days Post-Op  Subjective: UOP improved.  Pain OK.  Pt was out of bed.    Objective: Vital signs in last 24 hours: Temp:  [97.5 F (36.4 C)-98.2 F (36.8 C)] 98.2 F (36.8 C) (07/01 0718) Pulse Rate:  [70-116] 77 (07/01 0800) Resp:  [10-38] 18 (07/01 0800) BP: (104-136)/(51-85) 117/58 mmHg (07/01 0800) SpO2:  [94 %-98 %] 96 % (07/01 0800) Arterial Line BP: (129-145)/(60-77) 145/77 mmHg (06/30 1000)    Intake/Output from previous day: 06/30 0701 - 07/01 0700 In: 2915.5 [P.O.:240; I.V.:2345.5; NG/GT:30; IV Piggyback:300] Out: 2275 [Urine:955; Emesis/NG output:900; Drains:420] Intake/Output this shift: Total I/O In: 100 [I.V.:100] Out: -   General appearance: alert, cooperative and mild distress Resp: breathing comfortably Cardio: reg rhythm, tachycardic GI: soft, approp tender, mildly distended, JP serosang  Lab Results:   Recent Labs  09/18/13 0420 09/19/13 0348  WBC 10.0 7.5  HGB 11.8* 9.6*  HCT 36.7* 29.5*  PLT 88* 58*   BMET  Recent Labs  09/18/13 0420 09/19/13 0348  NA 138 138  K 4.2 3.9  CL 103 102  CO2 22 26  GLUCOSE 118* 103*  BUN 17 18  CREATININE 0.98 1.09  CALCIUM 8.1* 8.2*   PT/INR No results found for this basename: LABPROT, INR,  in the last 72 hours ABG No results found for this basename: PHART, PCO2, PO2, HCO3,  in the last 72 hours  Studies/Results: Dg Chest Portable 1 View  09/17/2013   CLINICAL DATA:  Status post line placement  EXAM: PORTABLE CHEST - 1 VIEW  COMPARISON:  CT scan of the chest dated Aug 14, 2013  FINDINGS: The right internal jugular venous catheter tip lies in the midportion of the SVC. The adjacent left power port appliance catheter tip is at the same level. The lungs are mildly hypoinflated. There is subdiaphragmatic gas on the right consistent with the patient's recent intra-abdominal surgery. No pleural line in the apex of the right lung is  demonstrated. The esophagogastric tube proximal port lies below the GE junction with the tip projecting off the image. The cardiac silhouette is top-normal in size.  IMPRESSION: There is no postprocedure complication following placement of the right internal jugular venous catheter.  These results were called by telephone at the time of interpretation on 09/17/2013 at 1:11 PM to [facility], [place], who verbally acknowledged these results.   Electronically Signed   By: David  Martinique   On: 09/17/2013 13:11    Anti-infectives: Anti-infectives   Start     Dose/Rate Route Frequency Ordered Stop   09/17/13 2000  ciprofloxacin (CIPRO) IVPB 400 mg     400 mg 200 mL/hr over 60 Minutes Intravenous Every 12 hours 09/17/13 1416 09/17/13 2044   09/17/13 0600  ciprofloxacin (CIPRO) IVPB 400 mg  Status:  Discontinued     400 mg 200 mL/hr over 60 Minutes Intravenous On call to O.R. 09/16/13 1307 09/17/13 1416      Assessment/Plan: s/p Procedure(s): OPEN HEPATECTOMY (N/A) LAPAROSCOPY DIAGNOSTIC (N/A) LIVER ULTRASOUND (N/A) CHOLECYSTECTOMY (N/A) D/c NGT D/C foley Transfer to floor. Pain control.  IV tylenol to finish today. T Bili up, will recheck tomorrow.   OOB.  LOS: 2 days    Jacob [name] 09/19/2013

## 2013-09-19 NOTE — Progress Notes (Signed)
Pt transferred to 6N19; ambulated about 500 feet and then transferred the rest of the way via wheelchair.  Pt on room air, VSS throughout transfer. Pt helped to chair in new room and was left with RN in room.  Tolerated transfer well.

## 2013-09-20 ENCOUNTER — Inpatient Hospital Stay (HOSPITAL_COMMUNITY): Payer: BC Managed Care – PPO

## 2013-09-20 LAB — COMPREHENSIVE METABOLIC PANEL
ALBUMIN: 3.2 g/dL — AB (ref 3.5–5.2)
ALK PHOS: 56 U/L (ref 39–117)
ALT: 384 U/L — AB (ref 0–53)
AST: 147 U/L — ABNORMAL HIGH (ref 0–37)
Anion gap: 13 (ref 5–15)
BILIRUBIN TOTAL: 4.5 mg/dL — AB (ref 0.3–1.2)
BUN: 13 mg/dL (ref 6–23)
CHLORIDE: 100 meq/L (ref 96–112)
CO2: 25 mEq/L (ref 19–32)
Calcium: 8.5 mg/dL (ref 8.4–10.5)
Creatinine, Ser: 0.82 mg/dL (ref 0.50–1.35)
GFR calc Af Amer: >90 mL/min (ref >90)
GFR calc non Af Amer: >90 mL/min (ref >90)
Glucose, Bld: 105 mg/dL — ABNORMAL HIGH (ref 70–99)
Potassium: 3.9 mEq/L (ref 3.7–5.3)
SODIUM: 138 meq/L (ref 137–147)
TOTAL PROTEIN: 5.2 g/dL — AB (ref 6.0–8.3)

## 2013-09-20 LAB — CBC
HEMATOCRIT: 31 % — AB (ref 39.0–52.0)
HEMOGLOBIN: 9.9 g/dL — AB (ref 13.0–17.0)
MCH: 30.7 pg (ref 26.0–34.0)
MCHC: 31.9 g/dL (ref 30.0–36.0)
MCV: 96 fL (ref 78.0–100.0)
Platelets: 91 10*3/uL — ABNORMAL LOW (ref 150–400)
RBC: 3.23 MIL/uL — ABNORMAL LOW (ref 4.22–5.81)
RDW: 16.1 % — ABNORMAL HIGH (ref 11.5–15.5)
WBC: 8.4 10*3/uL (ref 4.0–10.5)

## 2013-09-20 MED ORDER — IOHEXOL 300 MG/ML  SOLN
100.0000 mL | Freq: Once | INTRAMUSCULAR | Status: AC | PRN
  Administered 2013-09-20: 100 mL via INTRAVENOUS

## 2013-09-20 NOTE — Progress Notes (Signed)
Patient ID: Jacob [name], male   DOB: February 08, 1971, 43 y.o.   MRN: [id number] 3 Days Post-Op  Subjective: Had some RUQ pain and nausea.   Objective: Vital signs in last 24 hours: Temp:  [97.8 F (36.6 C)-98.5 F (36.9 C)] 98.3 F (36.8 C) (07/02 0946) Pulse Rate:  [70-84] 80 (07/02 0946) Resp:  [12-20] 16 (07/02 0946) BP: (129-147)/(78-84) 135/84 mmHg (07/02 0946) SpO2:  [95 %-100 %] 98 % (07/02 0946) Last BM Date: 09/17/13  Intake/Output from previous day: 07/01 0701 - 07/02 0700 In: 2255 [I.V.:2175; NG/GT:30] Out: 1475 [Urine:1075; Drains:400] Intake/Output this shift:    General appearance: alert, cooperative and mild distress Resp: breathing comfortably Cardio: reg rhythm, tachycardic GI: soft, approp tender, mildly distended, JP serosang  Lab Results:   Recent Labs  09/19/13 0348 09/20/13 0525  WBC 7.5 8.4  HGB 9.6* 9.9*  HCT 29.5* 31.0*  PLT 58* 91*   BMET  Recent Labs  09/19/13 0348 09/20/13 0525  NA 138 138  K 3.9 3.9  CL 102 100  CO2 26 25  GLUCOSE 103* 105*  BUN 18 13  CREATININE 1.09 0.82  CALCIUM 8.2* 8.5   PT/INR No results found for this basename: LABPROT, INR,  in the last 72 hours ABG  Recent Labs  09/17/13 1106  PHART 7.405  HCO3 24.3*    Studies/Results: No results found.  Anti-infectives: Anti-infectives   Start     Dose/Rate Route Frequency Ordered Stop   09/17/13 2000  ciprofloxacin (CIPRO) IVPB 400 mg     400 mg 200 mL/hr over 60 Minutes Intravenous Every 12 hours 09/17/13 1416 09/17/13 2044   09/17/13 0600  ciprofloxacin (CIPRO) IVPB 400 mg  Status:  Discontinued     400 mg 200 mL/hr over 60 Minutes Intravenous On call to O.R. 09/16/13 1307 09/17/13 1416      Assessment/Plan: s/p Procedure(s): OPEN HEPATECTOMY (N/A) LAPAROSCOPY DIAGNOSTIC (N/A) LIVER ULTRASOUND (N/A) CHOLECYSTECTOMY (N/A)  Pain control. T Bili up, will recheck tomorrow.   Get CT to see if bile duct dilated.   OOB.  Check HIT panel.    Ileus D/c OnQ  LOS: 3 days    [facility] 09/20/2013

## 2013-09-21 LAB — COMPREHENSIVE METABOLIC PANEL
ALK PHOS: 65 U/L (ref 39–117)
ALT: 254 U/L — ABNORMAL HIGH (ref 0–53)
AST: 79 U/L — AB (ref 0–37)
Albumin: 2.8 g/dL — ABNORMAL LOW (ref 3.5–5.2)
Anion gap: 10 (ref 5–15)
BILIRUBIN TOTAL: 4.4 mg/dL — AB (ref 0.3–1.2)
BUN: 15 mg/dL (ref 6–23)
CHLORIDE: 101 meq/L (ref 96–112)
CO2: 26 mEq/L (ref 19–32)
Calcium: 8.2 mg/dL — ABNORMAL LOW (ref 8.4–10.5)
Creatinine, Ser: 0.84 mg/dL (ref 0.50–1.35)
GFR calc Af Amer: >90 mL/min (ref >90)
GFR calc non Af Amer: >90 mL/min (ref >90)
Glucose, Bld: 101 mg/dL — ABNORMAL HIGH (ref 70–99)
POTASSIUM: 3.9 meq/L (ref 3.7–5.3)
Sodium: 137 mEq/L (ref 137–147)
Total Protein: 5 g/dL — ABNORMAL LOW (ref 6.0–8.3)

## 2013-09-21 LAB — CBC
HCT: 28.9 % — ABNORMAL LOW (ref 39.0–52.0)
Hemoglobin: 9.2 g/dL — ABNORMAL LOW (ref 13.0–17.0)
MCH: 30 pg (ref 26.0–34.0)
MCHC: 31.8 g/dL (ref 30.0–36.0)
MCV: 94.1 fL (ref 78.0–100.0)
PLATELETS: 82 10*3/uL — AB (ref 150–400)
RBC: 3.07 MIL/uL — AB (ref 4.22–5.81)
RDW: 16.5 % — AB (ref 11.5–15.5)
WBC: 5.8 10*3/uL (ref 4.0–10.5)

## 2013-09-21 NOTE — Progress Notes (Signed)
Drain output already 300cc this shift.  Drainage is dark red.  Dr. Harlow Asa aware. Will continue to monitor.

## 2013-09-21 NOTE — Progress Notes (Signed)
Patient ID: Jacob [name], male   DOB: June 09, 1970, 43 y.o.   MRN: [id number]  [facility] - Progress Note  POD# 4  Subjective: Patient with moderate pain, nausea yesterday.  CT scan with no biliary dilatation.  Family at bedside.  Objective: Vital signs in last 24 hours: Temp:  [97.9 F (36.6 C)-98.3 F (36.8 C)] 98.2 F (36.8 C) (07/03 0452) Pulse Rate:  [75-93] 82 (07/03 0452) Resp:  [16-27] 20 (07/03 0826) BP: (135-149)/(84-90) 143/86 mmHg (07/03 0452) SpO2:  [96 %-100 %] 97 % (07/03 0826) Last BM Date: 09/17/13  Intake/Output from previous day: 07/02 0701 - 07/03 0700 In: 1630 [P.O.:80; I.V.:1550] Out: 1115 [Urine:800; Drains:315]  Exam: HEENT - clear, not icteric Neck - soft Chest - clear bilaterally Cor - RRR, no murmur Abd - soft, mild distension; wound clear and dry and intact; drain with thin serosanguinous output, increased this AM with mobility; rare BS Ext - no significant edema Neuro - grossly intact, no focal deficits  Lab Results:   Recent Labs  09/20/13 0525 09/21/13 0448  WBC 8.4 5.8  HGB 9.9* 9.2*  HCT 31.0* 28.9*  PLT 91* 82*     Recent Labs  09/20/13 0525 09/21/13 0448  NA 138 137  K 3.9 3.9  CL 100 101  CO2 25 26  GLUCOSE 105* 101*  BUN 13 15  CREATININE 0.82 0.84  CALCIUM 8.5 8.2*    Studies/Results: Ct Abdomen W Contrast  09/20/2013   CLINICAL DATA:  Elevated liver function tests. Rectal cancer. Evaluate for bile duct dilatation. Cholecystectomy. Recent partial hepatectomy. Right-sided hepatic metastasis resection.  EXAM: CT ABDOMEN WITH CONTRAST  TECHNIQUE: Multidetector CT imaging of the abdomen was performed using the standard protocol following bolus administration of intravenous contrast.  CONTRAST:  170mL OMNIPAQUE IOHEXOL 300 MG/ML  SOLN  COMPARISON:  08/14/2013  FINDINGS: Lower Chest: Right greater than left bibasilar airspace disease. Small right and trace left pleural fluid. Normal heart  size.  Abdomen: Arterial phase images demonstrate a suggestion of hyper enhancement within the inferior right lobe of the liver on image 64/series 201. Nonspecific. No hypervascular lesions within the pancreas or kidneys.  Portal venous phase images demonstrate partial right hepatectomy. No intrahepatic biliary ductal dilatation. Fluid and extraluminal air centered about the right upper quadrant, likely postoperative. No well-defined collection to suggest abscess. A right upper quadrant surgical drain which terminates in the region of Morrison's pouch. Suspect mild hepatic steatosis.  Normal spleen, stomach, pancreas. No pancreatic ductal dilatation. Prior cholecystectomy. No biliary ductal dilatation or evidence of choledocholithiasis. Portal veins patent. The remaining hepatic veins are patent. Probable filling defect within the IVC just above the hepatic veins as it enters the right atrium. Example image 9/ series 301. Also image 23/series 304. This area is excluded from the kidney delayed images.  Normal adrenal glands and kidneys. No retroperitoneal or retrocrural adenopathy. Normal caliber of abdominal bowel loops, without mesenteric adenopathy or omental/ peritoneal disease.  Surgical staples and subcutaneous air about the right side of the abdomen. No acute osseous abnormality.  Bones/Musculoskeletal:  No acute osseous abnormality.  IMPRESSION: 1. Status post partial right hepatectomy, without intra or extrahepatic biliary ductal dilatation. 2. Ill-defined fluid and extraluminal gas, centered about the right upper quadrant. Favored to be within normal variation in this patient who is postop day 3. No well-defined fluid collection to suggest abscess. 3. Apparent filling defect within the IVC, just above the hepatic vein insertion and below the  right atrium. This is suspicious for adherent, nonocclusive thrombus. 4. Right greater than left bilateral pleural effusions. Mild left base subsegmental atelectasis  with right base atelectasis versus less likely developing infection. 5. Suspicion of hepatic steatosis.   Electronically Signed   By: Abigail Miyamoto M.D.   On: 09/20/2013 16:33    Assessment / Plan: 1.  Status post right hepatectomy  Continue sips of clears as tolerated  Encouraged OOB, ambulation  LFT's improved this AM  Monitor drain output  Earnstine Regal, MD, [facility] Office: [phone]  09/21/2013

## 2013-09-22 LAB — COMPREHENSIVE METABOLIC PANEL
ALBUMIN: 2.7 g/dL — AB (ref 3.5–5.2)
ALT: 171 U/L — ABNORMAL HIGH (ref 0–53)
ANION GAP: 10 (ref 5–15)
AST: 59 U/L — ABNORMAL HIGH (ref 0–37)
Alkaline Phosphatase: 76 U/L (ref 39–117)
BILIRUBIN TOTAL: 3.6 mg/dL — AB (ref 0.3–1.2)
BUN: 13 mg/dL (ref 6–23)
CHLORIDE: 101 meq/L (ref 96–112)
CO2: 25 mEq/L (ref 19–32)
CREATININE: 0.71 mg/dL (ref 0.50–1.35)
Calcium: 8.1 mg/dL — ABNORMAL LOW (ref 8.4–10.5)
GFR calc Af Amer: >90 mL/min (ref >90)
GFR calc non Af Amer: >90 mL/min (ref >90)
Glucose, Bld: 102 mg/dL — ABNORMAL HIGH (ref 70–99)
Potassium: 3.6 mEq/L — ABNORMAL LOW (ref 3.7–5.3)
Sodium: 136 mEq/L — ABNORMAL LOW (ref 137–147)
Total Protein: 4.8 g/dL — ABNORMAL LOW (ref 6.0–8.3)

## 2013-09-22 LAB — CBC
HCT: 26.8 % — ABNORMAL LOW (ref 39.0–52.0)
Hemoglobin: 8.6 g/dL — ABNORMAL LOW (ref 13.0–17.0)
MCH: 29.9 pg (ref 26.0–34.0)
MCHC: 32.1 g/dL (ref 30.0–36.0)
MCV: 93.1 fL (ref 78.0–100.0)
Platelets: 70 10*3/uL — ABNORMAL LOW (ref 150–400)
RBC: 2.88 MIL/uL — ABNORMAL LOW (ref 4.22–5.81)
RDW: 16.6 % — AB (ref 11.5–15.5)
WBC: 4 10*3/uL (ref 4.0–10.5)

## 2013-09-22 MED ORDER — ENOXAPARIN SODIUM 40 MG/0.4ML ~~LOC~~ SOLN: 40.0000 mg | SUBCUTANEOUS | Status: DC

## 2013-09-22 NOTE — Progress Notes (Signed)
Patient ID: Jacob [name], male   DOB: 08/10/1970, 43 y.o.   MRN: [id number]  [facility] - Progress Note  POD# 5  Subjective: Pt still having some good times and bad times.  + flatus.  No BM.  Drain putting out a lot of serosang fluid.    Objective: Vital signs in last 24 hours: Temp:  [97.6 F (36.4 C)-98.3 F (36.8 C)] 97.8 F (36.6 C) (07/04 0603) Pulse Rate:  [75-94] 77 (07/04 0603) Resp:  [17-20] 18 (07/04 0603) BP: (133-159)/(80-94) 136/83 mmHg (07/04 0603) SpO2:  [96 %-99 %] 99 % (07/04 0603) Last BM Date: 09/17/13  Intake/Output from previous day: 07/03 0701 - 07/04 0700 In: 3960 [P.O.:1360; I.V.:2600] Out: 3075 [Urine:1800; Drains:1275]  Exam: HEENT - clear, not icteric Neck - soft Chest - breathing comfortably Abd - soft, mild distension; wound clear and dry and intact; drain with thin serosanguinous output, increased this AM with mobility Ext - no significant edema Neuro - grossly intact, no focal deficits  Lab Results:   Recent Labs  09/21/13 0448 09/22/13 0426  WBC 5.8 4.0  HGB 9.2* 8.6*  HCT 28.9* 26.8*  PLT 82* 70*     Recent Labs  09/21/13 0448 09/22/13 0426  NA 137 136*  K 3.9 3.6*  CL 101 101  CO2 26 25  GLUCOSE 101* 102*  BUN 15 13  CREATININE 0.84 0.71  CALCIUM 8.2* 8.1*    Studies/Results: Ct Abdomen W Contrast  09/20/2013   CLINICAL DATA:  Elevated liver function tests. Rectal cancer. Evaluate for bile duct dilatation. Cholecystectomy. Recent partial hepatectomy. Right-sided hepatic metastasis resection.  EXAM: CT ABDOMEN WITH CONTRAST  TECHNIQUE: Multidetector CT imaging of the abdomen was performed using the standard protocol following bolus administration of intravenous contrast.  CONTRAST:  129mL OMNIPAQUE IOHEXOL 300 MG/ML  SOLN  COMPARISON:  08/14/2013  FINDINGS: Lower Chest: Right greater than left bibasilar airspace disease. Small right and trace left pleural fluid. Normal heart size.   Abdomen: Arterial phase images demonstrate a suggestion of hyper enhancement within the inferior right lobe of the liver on image 64/series 201. Nonspecific. No hypervascular lesions within the pancreas or kidneys.  Portal venous phase images demonstrate partial right hepatectomy. No intrahepatic biliary ductal dilatation. Fluid and extraluminal air centered about the right upper quadrant, likely postoperative. No well-defined collection to suggest abscess. A right upper quadrant surgical drain which terminates in the region of Morrison's pouch. Suspect mild hepatic steatosis.  Normal spleen, stomach, pancreas. No pancreatic ductal dilatation. Prior cholecystectomy. No biliary ductal dilatation or evidence of choledocholithiasis. Portal veins patent. The remaining hepatic veins are patent. Probable filling defect within the IVC just above the hepatic veins as it enters the right atrium. Example image 9/ series 301. Also image 23/series 304. This area is excluded from the kidney delayed images.  Normal adrenal glands and kidneys. No retroperitoneal or retrocrural adenopathy. Normal caliber of abdominal bowel loops, without mesenteric adenopathy or omental/ peritoneal disease.  Surgical staples and subcutaneous air about the right side of the abdomen. No acute osseous abnormality.  Bones/Musculoskeletal:  No acute osseous abnormality.  IMPRESSION: 1. Status post partial right hepatectomy, without intra or extrahepatic biliary ductal dilatation. 2. Ill-defined fluid and extraluminal gas, centered about the right upper quadrant. Favored to be within normal variation in this patient who is postop day 3. No well-defined fluid collection to suggest abscess. 3. Apparent filling defect within the IVC, just above the hepatic vein insertion and  below the right atrium. This is suspicious for adherent, nonocclusive thrombus. 4. Right greater than left bilateral pleural effusions. Mild left base subsegmental atelectasis with  right base atelectasis versus less likely developing infection. 5. Suspicion of hepatic steatosis.   Electronically Signed   By: Abigail Miyamoto M.D.   On: 09/20/2013 16:33    Assessment / Plan: 1.  Status post right hepatectomy  Advance diet.    Encouraged OOB, ambulation  LFT's improved more significantly this AM  Monitor drain output Suspect some ascites from hepatectomy Hold lovenox for thrombocytopenia.     [facility] Office: [phone]  09/22/2013

## 2013-09-23 LAB — CBC
HEMATOCRIT: 27.8 % — AB (ref 39.0–52.0)
Hemoglobin: 8.9 g/dL — ABNORMAL LOW (ref 13.0–17.0)
MCH: 30 pg (ref 26.0–34.0)
MCHC: 32 g/dL (ref 30.0–36.0)
MCV: 93.6 fL (ref 78.0–100.0)
PLATELETS: 71 10*3/uL — AB (ref 150–400)
RBC: 2.97 MIL/uL — ABNORMAL LOW (ref 4.22–5.81)
RDW: 16.8 % — AB (ref 11.5–15.5)
WBC: 5 10*3/uL (ref 4.0–10.5)

## 2013-09-23 LAB — COMPREHENSIVE METABOLIC PANEL
ALBUMIN: 2.7 g/dL — AB (ref 3.5–5.2)
ALK PHOS: 103 U/L (ref 39–117)
ALT: 136 U/L — ABNORMAL HIGH (ref 0–53)
AST: 56 U/L — AB (ref 0–37)
Anion gap: 11 (ref 5–15)
BILIRUBIN TOTAL: 3.4 mg/dL — AB (ref 0.3–1.2)
BUN: 14 mg/dL (ref 6–23)
CHLORIDE: 100 meq/L (ref 96–112)
CO2: 25 mEq/L (ref 19–32)
Calcium: 8.5 mg/dL (ref 8.4–10.5)
Creatinine, Ser: 0.74 mg/dL (ref 0.50–1.35)
GFR calc Af Amer: >90 mL/min (ref >90)
GFR calc non Af Amer: >90 mL/min (ref >90)
Glucose, Bld: 106 mg/dL — ABNORMAL HIGH (ref 70–99)
Potassium: 3.9 mEq/L (ref 3.7–5.3)
Sodium: 136 mEq/L — ABNORMAL LOW (ref 137–147)
Total Protein: 5.3 g/dL — ABNORMAL LOW (ref 6.0–8.3)

## 2013-09-23 MED ORDER — OXYCODONE HCL 5 MG PO TABS
5.0000 mg | ORAL_TABLET | ORAL | Status: DC | PRN
  Administered 2013-09-23 – 2013-09-24 (×7): 10 mg via ORAL
  Filled 2013-09-23 (×7): qty 2

## 2013-09-23 MED ORDER — CIPROFLOXACIN HCL 500 MG PO TABS
500.0000 mg | ORAL_TABLET | Freq: Two times a day (BID) | ORAL | Status: DC
  Administered 2013-09-23 – 2013-09-24 (×3): 500 mg via ORAL
  Filled 2013-09-23 (×5): qty 1

## 2013-09-23 MED ORDER — SPIRONOLACTONE 25 MG PO TABS
25.0000 mg | ORAL_TABLET | Freq: Every day | ORAL | Status: DC
  Administered 2013-09-23 – 2013-09-24 (×2): 25 mg via ORAL
  Filled 2013-09-23 (×2): qty 1

## 2013-09-23 MED ORDER — BISACODYL 10 MG RE SUPP
10.0000 mg | Freq: Every day | RECTAL | Status: DC
  Administered 2013-09-23: 10 mg via RECTAL
  Filled 2013-09-23 (×2): qty 1

## 2013-09-23 NOTE — Progress Notes (Signed)
Patient ID: Jacob [name], male   DOB: Jul 28, 1970, 43 y.o.   MRN: [id number]  [facility] - Progress Note  POD# 6  Subjective: Pt continues to have right back pain.  Also had some swelling and pain and left arm IV site.    Objective: Vital signs in last 24 hours: Temp:  [98.4 F (36.9 C)-98.8 F (37.1 C)] 98.4 F (36.9 C) (07/05 0552) Pulse Rate:  [81-114] 90 (07/05 0552) Resp:  [12-21] 18 (07/05 0552) BP: (130-160)/(71-111) 130/71 mmHg (07/05 0552) SpO2:  [97 %-100 %] 97 % (07/05 0552) Last BM Date: 09/23/13  Intake/Output from previous day: 07/04 0701 - 07/05 0700 In: 2065.8 [P.O.:240; I.V.:1825.8] Out: 845 [Drains:845]  Exam: HEENT - clear, not icteric Neck - soft Chest - breathing comfortably Abd - soft, mild distension; wound clear and dry and intact; drain with thin serosanguinous output, increased this AM with mobility Ext - no significant edema, left forearm with faint redness and swelling.  Neuro - grossly intact, no focal deficits  Lab Results:   Recent Labs  09/22/13 0426 09/23/13 0450  WBC 4.0 5.0  HGB 8.6* 8.9*  HCT 26.8* 27.8*  PLT 70* 71*     Recent Labs  09/22/13 0426 09/23/13 0450  NA 136* 136*  K 3.6* 3.9  CL 101 100  CO2 25 25  GLUCOSE 102* 106*  BUN 13 14  CREATININE 0.71 0.74  CALCIUM 8.1* 8.5    Studies/Results: No results found.  Assessment / Plan: 1.  Status post right hepatectomy  Diet as tolerated  Encouraged OOB, ambulation  LFTs improved more this AM  Monitor drain output Suspect some ascites from hepatectomy, add aldactone D/c PCA, saline lock IVF cipro for thrombophlebitis at IV site Hold lovenox for thrombocytopenia.   [facility] Office: [phone]  09/23/2013

## 2013-09-24 LAB — CBC
HCT: 25.1 % — ABNORMAL LOW (ref 39.0–52.0)
Hemoglobin: 8.2 g/dL — ABNORMAL LOW (ref 13.0–17.0)
MCH: 30.3 pg (ref 26.0–34.0)
MCHC: 32.7 g/dL (ref 30.0–36.0)
MCV: 92.6 fL (ref 78.0–100.0)
PLATELETS: 70 10*3/uL — AB (ref 150–400)
RBC: 2.71 MIL/uL — ABNORMAL LOW (ref 4.22–5.81)
RDW: 17.1 % — ABNORMAL HIGH (ref 11.5–15.5)
WBC: 4.4 10*3/uL (ref 4.0–10.5)

## 2013-09-24 LAB — COMPREHENSIVE METABOLIC PANEL
ALK PHOS: 108 U/L (ref 39–117)
ALT: 108 U/L — AB (ref 0–53)
AST: 55 U/L — ABNORMAL HIGH (ref 0–37)
Albumin: 2.5 g/dL — ABNORMAL LOW (ref 3.5–5.2)
Anion gap: 10 (ref 5–15)
BUN: 11 mg/dL (ref 6–23)
CALCIUM: 8 mg/dL — AB (ref 8.4–10.5)
CO2: 25 meq/L (ref 19–32)
Chloride: 102 mEq/L (ref 96–112)
Creatinine, Ser: 0.76 mg/dL (ref 0.50–1.35)
GFR calc Af Amer: >90 mL/min (ref >90)
Glucose, Bld: 92 mg/dL (ref 70–99)
POTASSIUM: 3.8 meq/L (ref 3.7–5.3)
SODIUM: 137 meq/L (ref 137–147)
Total Bilirubin: 2.9 mg/dL — ABNORMAL HIGH (ref 0.3–1.2)
Total Protein: 4.8 g/dL — ABNORMAL LOW (ref 6.0–8.3)

## 2013-09-24 LAB — HEPARIN INDUCED THROMBOCYTOPENIA PNL
Heparin Induced Plt Ab: NEGATIVE
Patient O.D.: 0.098
UFH High Dose UFH H: 7 % Release
UFH LOW DOSE 0.1 IU/ML: 5 %
UFH Low Dose 0.5 IU/mL: 2 % Release
UFH SRA RESULT: NEGATIVE

## 2013-09-24 MED ORDER — HEPARIN SOD (PORK) LOCK FLUSH 100 UNIT/ML IV SOLN
500.0000 [IU] | INTRAVENOUS | Status: DC | PRN
  Administered 2013-09-24: 500 [IU]

## 2013-09-24 MED ORDER — SPIRONOLACTONE 25 MG PO TABS: 25.0000 mg | ORAL_TABLET | Freq: Every day | ORAL | Status: DC

## 2013-09-24 MED ORDER — OXYCODONE HCL 5 MG PO TABS: 5.0000 mg | ORAL_TABLET | ORAL | Status: DC | PRN

## 2013-09-24 NOTE — Discharge Summary (Signed)
Physician Discharge Summary  Patient ID: Jacob [name] MRN: [id number] DOB/AGE: [birth year] 43 y.o.  Admit date: 09/17/2013 Discharge date: 09/24/2013  Admission Diagnoses: Patient Active Problem List   Diagnosis Date Noted  . Rectal cancer, metastatic to liver 08/28/2013    Priority: High  . Metastatic adenocarcinoma to liver 09/17/2013  . Rectal cancer metastasized to liver 09/17/2013  . 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 Anemia 12/31/2012  . Grief reaction 06/18/2012  . Malignant neoplasm of rectum 03/17/2012  . GERD (gastroesophageal reflux disease)   . Elevated liver function tests 01/19/2012  . Overweight 01/19/2012  . Elevated BP 01/19/2012  . Sun-damaged skin 01/19/2012    Discharge Diagnoses:  Active Problems:   Metastatic adenocarcinoma to liver   Rectal cancer metastasized to liver hypoalbuminemia Thrombocytopenia Ascites cholestasis  Discharged Condition: stable  Hospital Course:  Pt was admitted to the ICU after his right hepatectomy.  He had some low UOP and tachycardia overnight.  He was left in the unit for an additional day for this to resolve.  He was given a bolus and IV albumin.  He developed thrombocytopenia, so lovenox was held.  He also had a rising bilirubin, so a CT scan was ordered to assess for biliary dilitation or biloma.  These were not present.  He was able to void with foley removal.  His drain output remained serous, but the output went up.  He was placed on aldactone.  He was able to have his diet advanced.  He was able to ambulate.  He complained of back pain, but felt like this was from the bed.  He was discharged to home in stable condition.    Consults: None  Significant Diagnostic Studies: labs: see epic.    Treatments: surgery: right hepatectomy  Discharge Exam: Blood pressure 116/66, pulse 84, temperature 98 F (36.7 C), temperature source Oral, resp. rate 16, height 5\' 10"  (1.778 m), weight 183 lb  (83.008 kg), SpO2 96.00%. General appearance: alert, cooperative and mild distress Eyes: sl icteric sclera Resp: breathing comfortably GI: soft, non tender, sl distended.  incision without erythema Extremities: edema Tr  Disposition: 01-Home or Self Care  Discharge Instructions   Call MD for:  difficulty breathing, headache or visual disturbances    Complete by:  As directed      Call MD for:  persistant nausea and vomiting    Complete by:  As directed      Call MD for:  redness, tenderness, or signs of infection (pain, swelling, redness, odor or green/yellow discharge around incision site)    Complete by:  As directed      Call MD for:  severe uncontrolled pain    Complete by:  As directed      Call MD for:  temperature >100.4    Complete by:  As directed      Diet - low sodium heart healthy    Complete by:  As directed      Increase activity slowly    Complete by:  As directed             Medication List         ALEVE 220 MG tablet  Generic drug:  naproxen sodium  Take 220 mg by mouth daily as needed (for headache).     ALPRAZolam 0.25 MG tablet  Commonly known as:  XANAX  Take 0.5 mg by mouth daily as needed for anxiety or sleep.     docusate sodium  100 MG capsule  Commonly known as:  COLACE  Take 100 mg by mouth daily as needed for mild constipation or moderate constipation. Occasionally bid     oxyCODONE 5 MG immediate release tablet  Commonly known as:  Oxy IR/ROXICODONE  Take 1-2 tablets (5-10 mg total) by mouth every 4 (four) hours as needed for moderate pain, severe pain or breakthrough pain.     potassium chloride SA 20 MEQ tablet  Commonly known as:  K-DUR,KLOR-CON  Take 20 mEq by mouth daily.     PROBIOTIC DAILY PO  Take 1 tablet by mouth daily.     spironolactone 25 MG tablet  Commonly known as:  ALDACTONE  Take 1 tablet (25 mg total) by mouth daily.         SignedStark Klein 09/24/2013, 8:38 AM

## 2013-09-24 NOTE — Discharge Instructions (Signed)
[facility]      [facility] °[phone] ° °ABDOMINAL SURGERY: POST OP INSTRUCTIONS ° °Always review your discharge instruction sheet given to you by the facility where your surgery was performed. ° °IF YOU HAVE DISABILITY OR FAMILY LEAVE FORMS, YOU MUST BRING THEM TO THE OFFICE FOR PROCESSING.  PLEASE DO NOT GIVE THEM TO YOUR DOCTOR. ° °1. A prescription for pain medication may be given to you upon discharge.  Take your pain medication as prescribed, if needed.  If narcotic pain medicine is not needed, then you may take acetaminophen (Tylenol) or ibuprofen (Advil) as needed. °2. Take your usually prescribed medications unless otherwise directed. °3. If you need a refill on your pain medication, please contact your pharmacy. They will contact our office to request authorization.  Prescriptions will not be filled after 5pm or on week-ends. °4. You should follow a light diet the first few days after arrival home, such as soup and crackers, pudding, etc.unless your doctor has advised otherwise. A high-fiber, low fat diet can be resumed as tolerated.   Be sure to include lots of fluids daily. Most patients will experience some swelling and bruising on the chest and neck area.  Ice packs will help.  Swelling and bruising can take several days to resolve °5. Most patients will experience some swelling and bruising in the area of the incision. Ice pack will help. Swelling and bruising can take several days to resolve..  °6. It is common to experience some constipation if taking pain medication after surgery.  Increasing fluid intake and taking a stool softener will usually help or prevent this problem from occurring.  A mild laxative (Milk of Magnesia or Miralax) should be taken according to package directions if there are no bowel movements after 48 hours. °7.  You may have steri-strips (small skin tapes) in place directly over the incision.  These strips should be left on the skin for 10-14 days.  If your  surgeon used skin glue on the incision, you may shower in 48 hours.  The glue will flake off over the next 2-3 weeks.  Any sutures or staples will be removed at the office during your follow-up visit. You may find that a light gauze bandage over your incision may keep your staples from being rubbed or pulled. You may shower and replace the bandage daily. °8. ACTIVITIES:  You may resume regular (light) daily activities beginning the next day--such as daily self-care, walking, climbing stairs--gradually increasing activities as tolerated.  You may have sexual intercourse when it is comfortable.  Refrain from any heavy lifting or straining until approved by your doctor. °a. You may drive when you no longer are taking prescription pain medication, you can comfortably wear a seatbelt, and you can safely maneuver your car and apply brakes °b. Return to Work: __________12 weeks if applicable_________________________ °9. You should see your doctor in the office for a follow-up appointment approximately two weeks after your surgery.  Make sure that you call for this appointment within a day or two after you arrive home to insure a convenient appointment time. °OTHER INSTRUCTIONS:  °_____________________________________________________________ °_____________________________________________________________ ° °WHEN TO CALL YOUR DOCTOR: °1. Fever over 101.0 °2. Inability to urinate °3. Nausea and/or vomiting °4. Extreme swelling or bruising °5. Continued bleeding from incision. °6. Increased pain, redness, or drainage from the incision. °7. Difficulty swallowing or breathing °8. Muscle cramping or spasms. °9. Numbness or tingling in hands or feet or around lips. ° °The clinic staff is   available to answer your questions during regular business hours.  Please don’t hesitate to call and ask to speak to one of the nurses if you have concerns. ° °For further questions, please visit [email or web address] ° ° ° °

## 2013-09-24 NOTE — Progress Notes (Signed)
Patient discharge to home. Discharge instructions given to patient.  No question verbalized.

## 2013-09-25 LAB — TYPE AND SCREEN
ABO/RH(D): A NEG
Antibody Screen: NEGATIVE
UNIT DIVISION: 0
UNIT DIVISION: 0
Unit division: 0
Unit division: 0

## 2013-09-26 ENCOUNTER — Telehealth (INDEPENDENT_AMBULATORY_CARE_PROVIDER_SITE_OTHER): Payer: Self-pay | Admitting: General Surgery

## 2013-09-26 NOTE — Telephone Encounter (Signed)
Called pt and let him know of the instructions below from Dr. Barry Dienes.  Patient verbalized understanding.

## 2013-09-26 NOTE — Telephone Encounter (Signed)
I will pull his earlier than that.  Please ask him to double his spironolactone to 50 mg po qday.  Also put him in at 2:15 on 7/22.   tx FB

## 2013-09-26 NOTE — Telephone Encounter (Signed)
Patient called in explaining that he did not get scheduled for his PO appt with Dr. Barry Dienes until 10/22/13.  He still has his drain in s/p hepatectomy and lap chole.  The patient explained that he drained 870 cc in the past 24 hours and it has been like that since he was discharged from the hospital.  Informed him that we do not usually pull these drains until they are close to 30 cc for 2 consecutive days.  Informed him to call us when it gets closer to that day and then we could make him a sooner appt.  Informed him that I will also route this message to Dr. Barry Dienes and her assistant so that they are aware of his drain output and so that they can work with his appt if they feel like he needs to be seen sooner.

## 2013-10-03 ENCOUNTER — Telehealth (INDEPENDENT_AMBULATORY_CARE_PROVIDER_SITE_OTHER): Payer: Self-pay

## 2013-10-03 ENCOUNTER — Other Ambulatory Visit (INDEPENDENT_AMBULATORY_CARE_PROVIDER_SITE_OTHER): Payer: Self-pay

## 2013-10-03 MED ORDER — ONDANSETRON HCL 4 MG PO TABS: 4.0000 mg | ORAL_TABLET | Freq: Four times a day (QID) | ORAL | Status: DC | PRN

## 2013-10-03 MED ORDER — OXYCODONE HCL 5 MG PO TABS: 5.0000 mg | ORAL_TABLET | ORAL | Status: DC | PRN

## 2013-10-03 NOTE — Telephone Encounter (Signed)
Pt s/p hepatectomy and lap chole on 09/17/13 with Dr Barry Dienes.  Pt is calling today to see if he can get a refill on his Oxycodone 5mg . Pt states that his pain is a 7 at this time. Pts last refill was 09/24/13. Pt states that he has also been having some nausea. Advised pt that we can call in a rx for Zofran 4mg  ODT one tablet every 6 hours PRN #15 no refills.  Advised pt that he can also take 800mg  Ibuprofen every 8 hours as needed for pain. Informed pt that I would send this message to our urgent office dr due to Dr Barry Dienes is out of the office. Will contact pt as soon as we receive a response from the dr. Pt verbalized understanding.

## 2013-10-09 ENCOUNTER — Ambulatory Visit (HOSPITAL_BASED_OUTPATIENT_CLINIC_OR_DEPARTMENT_OTHER): Payer: BC Managed Care – PPO | Admitting: Nurse Practitioner

## 2013-10-09 ENCOUNTER — Telehealth: Payer: Self-pay | Admitting: Nurse Practitioner

## 2013-10-09 ENCOUNTER — Ambulatory Visit: Payer: BC Managed Care – PPO

## 2013-10-09 VITALS — BP 122/85 | HR 81 | Temp 98.2°F | Resp 18 | Ht 70.0 in | Wt 168.1 lb

## 2013-10-09 DIAGNOSIS — T451X5A Adverse effect of antineoplastic and immunosuppressive drugs, initial encounter: Secondary | ICD-10-CM

## 2013-10-09 DIAGNOSIS — K625 Hemorrhage of anus and rectum: Secondary | ICD-10-CM

## 2013-10-09 DIAGNOSIS — C20 Malignant neoplasm of rectum: Secondary | ICD-10-CM

## 2013-10-09 DIAGNOSIS — R112 Nausea with vomiting, unspecified: Secondary | ICD-10-CM

## 2013-10-09 DIAGNOSIS — R0989 Other specified symptoms and signs involving the circulatory and respiratory systems: Secondary | ICD-10-CM

## 2013-10-09 DIAGNOSIS — R0609 Other forms of dyspnea: Secondary | ICD-10-CM

## 2013-10-09 DIAGNOSIS — G622 Polyneuropathy due to other toxic agents: Secondary | ICD-10-CM

## 2013-10-09 DIAGNOSIS — C787 Secondary malignant neoplasm of liver and intrahepatic bile duct: Secondary | ICD-10-CM

## 2013-10-09 DIAGNOSIS — Z95828 Presence of other vascular implants and grafts: Secondary | ICD-10-CM

## 2013-10-09 DIAGNOSIS — D702 Other drug-induced agranulocytosis: Secondary | ICD-10-CM

## 2013-10-09 DIAGNOSIS — R911 Solitary pulmonary nodule: Secondary | ICD-10-CM

## 2013-10-09 MED ORDER — HEPARIN SOD (PORK) LOCK FLUSH 100 UNIT/ML IV SOLN
500.0000 [IU] | Freq: Once | INTRAVENOUS | Status: DC
  Filled 2013-10-09: qty 5

## 2013-10-09 MED ORDER — SODIUM CHLORIDE 0.9 % IJ SOLN
10.0000 mL | INTRAMUSCULAR | Status: DC | PRN
  Filled 2013-10-09: qty 10

## 2013-10-09 NOTE — Progress Notes (Signed)
Port-A-Cath flush canceled per Ned Card, NP.

## 2013-10-09 NOTE — Progress Notes (Addendum)
[place] OFFICE PROGRESS NOTE   Diagnosis:  Rectal cancer.  INTERVAL HISTORY:   Jacob [name] returns as scheduled. He underwent a right hepatic lobectomy on 09/17/2013 by Dr. Barry [name]. Ultrasound showed right-sided lesions with the largest around 16 mm consistent with prior imaging. The left side appeared clear. Pathology showed multiple foci of metastatic adenocarcinoma. Margins were negative. Biopsy of a portal lymph node showed benign adipose tissue. No lymph node tissue or malignancy.  Jacob [name] feels he is slowly recovering from the surgery. He thinks he will feel better once the drain is removed. Appetite is poor. He is trying to drink nutritional supplements. He had an episode of nausea/vomiting about a week ago. He has been constipated which he feels in part is due to the pain medication. He recently began Tylenol in place of the narcotic. He is taking Colace. He denies fever and cough. He notes mild dyspnea on exertion. No leg swelling or calf pain.  Objective:  Vital signs in last 24 hours:  Blood pressure 122/85, pulse 81, temperature 98.2 F (36.8 C), temperature source Oral, resp. rate 18, height 5\' 10"  (1.778 m), weight 168 lb 1.6 oz (76.25 kg), SpO2 100.00%.    HEENT: No thrush or ulcerations. Resp: Lungs clear. Cardio: Regular cardiac rhythm. GI: Abdomen is soft. Tender over the right abdomen. Drain site with mild erythema. Vascular: No leg edema. Calves nontender.  Port-A-Cath site without erythema.   Lab Results:  Lab Results  Component Value Date   WBC 4.4 09/24/2013   HGB 8.2* 09/24/2013   HCT 25.1* 09/24/2013   MCV 92.6 09/24/2013   PLT 70* 09/24/2013   NEUTROABS 3.4 09/12/2013    Imaging:  No results found.  Medications: I have reviewed the patient's current medications.  Assessment/Plan: 1. Rectal cancer. Partially obstructing mass noted 1-2 cm from the anal verge on a colonoscopy 03/06/2012. Endoscopic ultrasound 03/14/2012 with a 7.5 mm  thick, 3.2 cm wide hypoechoic, irregularly bordered mass that clearly passed into and through the muscularis propria layer of the distal rectal wall (uT3); 3 small (largest 7 mm) perirectal lymph nodes. The lymph nodes were all round, discrete, hypoechoic, homogenous; suspicious for malignant involvement (uN1).  He began radiation and concurrent Xeloda chemotherapy on 03/20/2012, completed 04/27/2012.  Low anterior resection/coloanal anastomosis and diverting ileostomy 06/29/2012 with the final pathology revealing a T2N0 tumor with extensive fibrosis and negative margins.  Cycle 1 of adjuvant CAPOX 07/19/2012. Cycle 5 of adjuvant CAPOX 10/11/2012.  CEA 2.5 on 11/14/2012.  CEA 14.6 03/08/2013.  Restaging CT evaluation 03/08/2013 with a 4 mm pulmonary nodule left lung base not identified on comparison exam; new liver lesions including a 29 x 26 mm irregular peripheral enhancing rounded lesion in the dome of the right hepatic lobe, a less well-defined new subcapsular lesion in the lateral right hepatic lobe measuring 12 mm, a new subcapsular lesion in the anterior right hepatic lobe adjacent to the gallbladder fossa measuring 10 mm; a rounded low-density lesion in the inferior right hepatic lobe measuring 10 mm compared to 7 mm on the prior study (radiologist commented this may represent an enlarged cyst).  MRI of the abdomen 04/12/2013 confirmed multiple T2 hyperintense metastatic lesions throughout the right liver  Initiation of FOLFIRI/Avastin with genotype based irinotecan dosing per the [facility] study 04/05/2013.  Restaging CT evaluation on 06/20/2013 (after 2 cycles/4 treatments) showed improvement in the liver metastases and stable size of a left lower lobe pulmonary nodule now with central cavitation.  Continuation of  FOLFIRI/Avastin.  Restaging CT evaluation 08/14/2013-decrease in the size of liver metastases, slight decrease in the size of a cavitary left lower lobe nodule, no evidence of disease  progression. Status post right hepatic lobectomy 09/17/2013. Pathology showed multiple foci of metastatic adenocarcinoma (5 nodules of metastatic adenocarcinoma 4 of which are subcapsular with the nodules ranging in size from 0.6-1.8 cm in greatest dimension). Margins not involved. Biopsy of a portal lymph node showed benign adipose tissue; no lymph node tissue or malignancy. 2. Irregular bowel habits/rectal bleeding secondary to #1. 3. Mild elevation of the liver enzymes. Question secondary to hepatic steatosis. 4. Indeterminate 8 mm posterior right liver lesion on the staging CT 03/06/2012. 5. Mildly elevated CEA at 5.7 on 03/06/2012. 6. History of radiation erythema at the groin and perineum 7. 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 No clinical evidence of thrombophlebitis or extravasation. 8. Delayed nausea following chemotherapy-Decadron prophylaxis was added with cycle 3 CAPOX-improved. 9. Ileostomy takedown 12/07/2012. 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 11. Port-A-Cath placement 12/31/204 12. Severe neutropenia secondary to chemotherapy following cycle 1 of FOLFIRI, chemotherapy was dose reduced and he received Neulasta with day 15 cycle 1  13. Nausea and vomiting following cycle 1 of FOLFIRI-the antiemetic regimen was adjusted with day 15 cycle 1.   Disposition: Jacob [name] is recovering from the recent surgery. Dr. Benay [name] does not recommend additional chemotherapy. We are referring him for a restaging CT scan of the chest 11/06/2013 which will be at a 12 week interval from the prior CT scan. We will obtain a CEA at that time as well. He will return for a followup visit on 11/09/2013 to review the results. He will contact the office in the interim with any problems.  Patient seen with Dr. Benay [name].    Ned Card ANP/GNP-BC   10/09/2013  2:42 PM  this was a shared visit with Ned Card. Mr.  [name] is recovering from the hepatectomy. His performance status is not adequate to receive  chemotherapy and there is no clear role for further chemotherapy in this setting.I recommend observation with consideration for resection or ablation of the lung lesion.  Julieanne Manson, M.D.

## 2013-10-09 NOTE — Telephone Encounter (Signed)
Pt confirmed labs/ov per 07/21 POF, gave pt AVS...KJ °

## 2013-10-09 NOTE — Patient Instructions (Signed)

## 2013-10-10 ENCOUNTER — Ambulatory Visit (INDEPENDENT_AMBULATORY_CARE_PROVIDER_SITE_OTHER): Payer: BC Managed Care – PPO | Admitting: General Surgery

## 2013-10-10 ENCOUNTER — Encounter (INDEPENDENT_AMBULATORY_CARE_PROVIDER_SITE_OTHER): Payer: Self-pay | Admitting: General Surgery

## 2013-10-10 VITALS — BP 110/80 | HR 96 | Resp 16 | Ht 70.0 in | Wt 168.4 lb

## 2013-10-10 DIAGNOSIS — C787 Secondary malignant neoplasm of liver and intrahepatic bile duct: Secondary | ICD-10-CM

## 2013-10-10 NOTE — Patient Instructions (Signed)
Take miralax twice daily 17 gm for 3 days, then go to once daily.    Call if you are still having issues with reflux and poor appetite.

## 2013-10-10 NOTE — Assessment & Plan Note (Signed)
Poor appetite is likely related to constipation.  Add miralax bid for three days, then go down to once daily.  Follow up in 3 weeks.

## 2013-10-10 NOTE — Progress Notes (Signed)
HISTORY: Pt is around 3 weeks s/p right hepatectomy for metastatic rectal cancer.  He originally had very high drain output.  He was started on spironolactone and this has helped.  His output was around 900 ml/day, and now is down to 60 mL/day.  He is having poor appetite.  He feels full and has to belch after eating.      EXAM: General:  Alert and oriented.   Incision:  Healing well.  Drain removed.     PATHOLOGY: Diagnosis 1. Liver, partial resection, right, and gallbladder - METASTATIC ADENOCARCINOMA, MULTIPLE FOCI. - MARGINS NOT INVOLVED. - UNREMARKABLE GALLBLADDER. 2. Lymph node, biopsy, portal - BENIGN ADIPOSE TISSUE. - NO LYMPH NODE TISSUE OR MALIGNANCY.   ASSESSMENT AND PLAN:   Rectal cancer, metastatic to liver Poor appetite is likely related to constipation.  Add miralax bid for three days, then go down to once daily.  Follow up in 3 weeks.        Milus Height, MD Surgical Oncology, [facility]  Penni Homans, MD Mosie Lukes, MD

## 2013-10-22 ENCOUNTER — Ambulatory Visit (INDEPENDENT_AMBULATORY_CARE_PROVIDER_SITE_OTHER): Payer: BC Managed Care – PPO | Admitting: General Surgery

## 2013-10-22 ENCOUNTER — Encounter (INDEPENDENT_AMBULATORY_CARE_PROVIDER_SITE_OTHER): Payer: Self-pay | Admitting: General Surgery

## 2013-10-22 VITALS — BP 124/86 | HR 68 | Temp 98.2°F | Resp 18 | Ht 70.0 in | Wt 172.8 lb

## 2013-10-22 DIAGNOSIS — C787 Secondary malignant neoplasm of liver and intrahepatic bile duct: Secondary | ICD-10-CM

## 2013-10-22 NOTE — Patient Instructions (Addendum)
Stop spironolactone.  Call if you develop swelling, especially in your stomach.    Follow up with me in 4 months.

## 2013-10-22 NOTE — Assessment & Plan Note (Signed)
Doing well.   Ascites resolved. D/c spironolactone. Has follow up with oncology.  Still has lung lesion that might get resected in future.

## 2013-10-22 NOTE — Progress Notes (Signed)
HISTORY: Pt is s/p right hepatectomy for metastatic rectal cancer 09/17/2013.  He originally had very high drain output.  He was started on spironolactone and this has helped.  His output was around 900 ml/day, but came down.  Drain was pulled 2-3 weeks ago at last visit.  He has continued to make improvements.  He is no longer taking pain meds.  He denies any abdominal or peripheral swelling.  He had an episode around 2 weeks ago of chest pain and shortness of breath that resolved after 1-2 hours.     EXAM: General:  Alert and oriented.   Incision:  Healing well.  No tenderness.     PATHOLOGY: Diagnosis 1. Liver, partial resection, right, and gallbladder - METASTATIC ADENOCARCINOMA, MULTIPLE FOCI. - MARGINS NOT INVOLVED. - UNREMARKABLE GALLBLADDER. 2. Lymph node, biopsy, portal - BENIGN ADIPOSE TISSUE. - NO LYMPH NODE TISSUE OR MALIGNANCY.   ASSESSMENT AND PLAN:   Rectal cancer, metastatic to liver Doing well.   Ascites resolved. D/c spironolactone. Has follow up with oncology.  Still has lung lesion that might get resected in future.         Milus Height, MD Surgical Oncology, [facility]  Penni Homans, MD Mosie Lukes, MD

## 2013-10-26 ENCOUNTER — Telehealth: Payer: Self-pay | Admitting: Dietician

## 2013-10-26 NOTE — Telephone Encounter (Signed)
Brief Outpatient Oncology Nutrition Note  Patient has been identified to be at risk on malnutrition screen.  Wt Readings from Last 10 Encounters:  10/22/13 172 lb 12.8 oz (78.382 kg)  10/10/13 168 lb 6.4 oz (76.386 kg)  10/09/13 168 lb 1.6 oz (76.25 kg)  09/17/13 183 lb (83.008 kg)  09/17/13 183 lb (83.008 kg)  09/12/13 183 lb 5 oz (83.15 kg)  08/30/13 178 lb (80.74 kg)  08/28/13 179 lb (81.194 kg)  08/23/13 178 lb 3.2 oz (80.831 kg)  08/16/13 175 lb 12.8 oz (79.742 kg)    Dx:  S/p right hepatectomy for metastatic rectal cancer 09/17/13.  Patient of Dr. Barry Dienes and Dr. Benay Spice.  Called patient who was unavailable.  Contact information for the [facility] provided.  Antonieta Iba, RD, LDN

## 2013-11-06 ENCOUNTER — Ambulatory Visit (HOSPITAL_COMMUNITY)
Admission: RE | Admit: 2013-11-06 | Discharge: 2013-11-06 | Disposition: A | Discharge status: Home / Self Care | Payer: BC Managed Care – PPO | Source: Ambulatory Visit | Attending: Nurse Practitioner | Admitting: Nurse Practitioner

## 2013-11-06 ENCOUNTER — Other Ambulatory Visit (HOSPITAL_BASED_OUTPATIENT_CLINIC_OR_DEPARTMENT_OTHER): Payer: BC Managed Care – PPO

## 2013-11-06 ENCOUNTER — Ambulatory Visit (HOSPITAL_BASED_OUTPATIENT_CLINIC_OR_DEPARTMENT_OTHER): Payer: BC Managed Care – PPO

## 2013-11-06 ENCOUNTER — Encounter (HOSPITAL_COMMUNITY): Payer: Self-pay

## 2013-11-06 VITALS — BP 130/81 | HR 69 | Temp 98.4°F

## 2013-11-06 DIAGNOSIS — C787 Secondary malignant neoplasm of liver and intrahepatic bile duct: Secondary | ICD-10-CM | POA: Diagnosis present

## 2013-11-06 DIAGNOSIS — C20 Malignant neoplasm of rectum: Secondary | ICD-10-CM

## 2013-11-06 DIAGNOSIS — Z95828 Presence of other vascular implants and grafts: Secondary | ICD-10-CM

## 2013-11-06 DIAGNOSIS — Z452 Encounter for adjustment and management of vascular access device: Secondary | ICD-10-CM

## 2013-11-06 DIAGNOSIS — C801 Malignant (primary) neoplasm, unspecified: Secondary | ICD-10-CM | POA: Diagnosis not present

## 2013-11-06 DIAGNOSIS — R918 Other nonspecific abnormal finding of lung field: Secondary | ICD-10-CM | POA: Diagnosis not present

## 2013-11-06 LAB — COMPREHENSIVE METABOLIC PANEL (CC13)
ALK PHOS: 84 U/L (ref 40–150)
ALT: 32 U/L (ref 0–55)
AST: 35 U/L — ABNORMAL HIGH (ref 5–34)
Albumin: 3 g/dL — ABNORMAL LOW (ref 3.5–5.0)
Anion Gap: 6 mEq/L (ref 3–11)
BUN: 8.8 mg/dL (ref 7.0–26.0)
CALCIUM: 8.8 mg/dL (ref 8.4–10.4)
CO2: 24 mEq/L (ref 22–29)
Chloride: 110 mEq/L — ABNORMAL HIGH (ref 98–109)
Creatinine: 0.8 mg/dL (ref 0.7–1.3)
GLUCOSE: 90 mg/dL (ref 70–140)
Potassium: 3.8 mEq/L (ref 3.5–5.1)
Sodium: 140 mEq/L (ref 136–145)
Total Bilirubin: 0.42 mg/dL (ref 0.20–1.20)
Total Protein: 6.4 g/dL (ref 6.4–8.3)

## 2013-11-06 LAB — CEA: CEA: 0.6 ng/mL (ref 0.0–5.0)

## 2013-11-06 MED ORDER — HEPARIN SOD (PORK) LOCK FLUSH 100 UNIT/ML IV SOLN
500.0000 [IU] | Freq: Once | INTRAVENOUS | Status: AC
  Administered 2013-11-06: 500 [IU] via INTRAVENOUS
  Filled 2013-11-06: qty 5

## 2013-11-06 MED ORDER — SODIUM CHLORIDE 0.9 % IJ SOLN
10.0000 mL | INTRAMUSCULAR | Status: DC | PRN
  Administered 2013-11-06: 10 mL via INTRAVENOUS
  Filled 2013-11-06: qty 10

## 2013-11-06 MED ORDER — IOHEXOL 300 MG/ML  SOLN
80.0000 mL | Freq: Once | INTRAMUSCULAR | Status: AC | PRN
  Administered 2013-11-06: 80 mL via INTRAVENOUS

## 2013-11-06 NOTE — Patient Instructions (Signed)

## 2013-11-09 ENCOUNTER — Ambulatory Visit (HOSPITAL_BASED_OUTPATIENT_CLINIC_OR_DEPARTMENT_OTHER): Payer: BC Managed Care – PPO | Admitting: Oncology

## 2013-11-09 ENCOUNTER — Telehealth: Payer: Self-pay | Admitting: Oncology

## 2013-11-09 ENCOUNTER — Encounter: Payer: Self-pay | Admitting: *Deleted

## 2013-11-09 VITALS — BP 123/74 | HR 87 | Temp 98.3°F | Resp 19 | Ht 70.0 in | Wt 179.6 lb

## 2013-11-09 DIAGNOSIS — C787 Secondary malignant neoplasm of liver and intrahepatic bile duct: Secondary | ICD-10-CM

## 2013-11-09 DIAGNOSIS — C20 Malignant neoplasm of rectum: Secondary | ICD-10-CM

## 2013-11-09 DIAGNOSIS — R079 Chest pain, unspecified: Secondary | ICD-10-CM

## 2013-11-09 NOTE — Telephone Encounter (Signed)
gv adn printed appt sched and avs for pt for Sept..Marland KitchenMarland Kitchen

## 2013-11-09 NOTE — Progress Notes (Signed)
[place] OFFICE PROGRESS NOTE   Diagnosis: Rectal cancer  INTERVAL HISTORY:   He returns as scheduled. His energy level has improved. He had an episode of sharp "knifelike "right-sided low anterior and posterior chest pain 3-4 weeks ago. The pain was worse with inspiration and lasted for approximately 1 hour. The pain recurred with less intensity 2 weeks later in the same location. No leg swelling or pain. The first episode of pain was one to 2 days after the surgical drain was removed.  He reports blood per rectum on a few occasions after straining for bowel movements.  Objective:  Vital signs in last 24 hours:  Blood pressure 123/74, pulse 87, temperature 98.3 F (36.8 C), temperature source Oral, resp. rate 19, height 5\' 10"  (1.778 m), weight 179 lb 9.6 oz (81.466 kg), SpO2 99.00%.   Resp: Good air movement bilaterally. End inspiratory bronchial sounds at the upper posterior chest bilaterally, no rub, no distress Cardio: Regular rate and rhythm GI: Mild tenderness in the upper abdomen, no hepatomegaly, no mass Rectal: There is a stricture 1-2 cm proximal to the anal verge that I could not pass with the examination finger. No mass. Vascular: No leg edema   Portacath/PICC-without erythema  Lab Results:    Lab Results  Component Value Date   CEA 0.6 11/06/2013    Imaging:  Ct Chest W Contrast  11/06/2013   CLINICAL DATA:  History of rectal cancer. Status post chemo and XRT.  EXAM: CT CHEST WITH CONTRAST  TECHNIQUE: Multidetector CT imaging of the chest was performed during intravenous contrast administration.  CONTRAST:  67mL OMNIPAQUE IOHEXOL 300 MG/ML  SOLN  COMPARISON:  08/14/2013  FINDINGS: No pleural effusion identified. Within the right posterior lung base there is a new subpleural nodule which measures 2.6 x 1.8 cm, image 47/series 5. This appears to be associated with postoperative changes from right hepatectomy. Within the left lower lobe there is a  nodule which 4 mm, image 37/series 5. This is unchanged from previous exam. Within the posterior left lower lobe there is a 5 mm nodule, image 40/series 5. Previously this measured 2-3 mm.  The heart size appears normal. No pericardial effusion. No mediastinal or hilar adenopathy. There is no enlarged supraclavicular or axillary adenopathy.  Incidental imaging through the upper abdomen demonstrates postoperative changes from right hepatectomy. No mass identified within the remaining portions of the liver.  Review of the visualized osseous structures shows no aggressive lytic or sclerotic bone lesion.  IMPRESSION: 1. No acute cardiopulmonary abnormalities. 2. New subpleural nodule in the posterior right lung base is indeterminate. This appears to be related to postoperative change from right hepatectomy. Local tumor recurrence is less favored. Consider further assessment with PET-CT versus close interval follow-up. 3. There are 2 nodules in the left lower lobe. One of these is slightly increased in size from previous exam which now measures 5 mm (versus 2 mm previously.)   Electronically Signed   By: Kerby Moors M.D.   On: 11/06/2013 09:18   I reviewed the CT images with Mr. Pringle  Medications: I have reviewed the patient's current medications.  Assessment/Plan: 1. Rectal cancer. Partially obstructing mass noted 1-2 cm from the anal verge on a colonoscopy 03/06/2012. Endoscopic ultrasound 03/14/2012 with a 7.5 mm thick, 3.2 cm wide hypoechoic, irregularly bordered mass that clearly passed into and through the muscularis propria layer of the distal rectal wall (uT3); 3 small (largest 7 mm) perirectal lymph nodes. The lymph nodes were  all round, discrete, hypoechoic, homogenous; suspicious for malignant involvement (uN1).  He began radiation and concurrent Xeloda chemotherapy on 03/20/2012, completed 04/27/2012.  Low anterior resection/coloanal anastomosis and diverting ileostomy 06/29/2012 with the final  pathology revealing a T2N0 tumor with extensive fibrosis and negative margins.  Cycle 1 of adjuvant CAPOX 07/19/2012. Cycle 5 of adjuvant CAPOX 10/11/2012.  CEA 2.5 on 11/14/2012.  CEA 14.6 03/08/2013.  Restaging CT evaluation 03/08/2013 with a 4 mm pulmonary nodule left lung base not identified on comparison exam; new liver lesions including a 29 x 26 mm irregular peripheral enhancing rounded lesion in the dome of the right hepatic lobe, a less well-defined new subcapsular lesion in the lateral right hepatic lobe measuring 12 mm, a new subcapsular lesion in the anterior right hepatic lobe adjacent to the gallbladder fossa measuring 10 mm; a rounded low-density lesion in the inferior right hepatic lobe measuring 10 mm compared to 7 mm on the prior study (radiologist commented this may represent an enlarged cyst).  MRI of the abdomen 04/12/2013 confirmed multiple T2 hyperintense metastatic lesions throughout the right liver  Initiation of FOLFIRI/Avastin with genotype based irinotecan dosing per the [facility] study 04/05/2013.  Restaging CT evaluation on 06/20/2013 (after 2 cycles/4 treatments) showed improvement in the liver metastases and stable size of a left lower lobe pulmonary nodule now with central cavitation.  Continuation of FOLFIRI/Avastin.  Restaging CT evaluation 08/14/2013-decrease in the size of liver metastases, slight decrease in the size of a cavitary left lower lobe nodule, no evidence of disease progression.  Status post right hepatic lobectomy 09/17/2013. Pathology showed multiple foci of metastatic adenocarcinoma (5 nodules of metastatic adenocarcinoma 4 of which are subcapsular with the nodules ranging in size from 0.6-1.8 cm in greatest dimension). Margins not involved. Biopsy of a portal lymph node showed benign adipose tissue; no lymph node tissue or malignancy. Normal CEA 11/06/2013 CT 11/06/2013 with a new pleural-based right lower chest lesion, slight in enlargement of the a  left-sided lung nodule 2. Irregular bowel habits/rectal bleeding secondary to #1. 3. Mild elevation of the liver enzymes. Question secondary to hepatic steatosis. 4. Indeterminate 8 mm posterior right liver lesion on the staging CT 03/06/2012. 5. Mildly elevated CEA at 5.7 on 03/06/2012. 6. History of radiation erythema at the groin and perineum 7. 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 No clinical evidence of thrombophlebitis or extravasation. 8. Delayed nausea following chemotherapy-Decadron prophylaxis was added with cycle 3 CAPOX-improved. 9. Ileostomy takedown 12/07/2012. 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 11. Port-A-Cath placement 12/31/204 12. Severe neutropenia secondary to chemotherapy following cycle 1 of FOLFIRI, chemotherapy was dose reduced and he received Neulasta with day 15 cycle 1  13. Nausea and vomiting following cycle 1 of FOLFIRI-the  14. Rectal stricture   Disposition:  He appears well today. The etiology of the acute right-sided chest discomfort last month is unclear. I have a low clinical suspicion for a pulmonary embolism. I reviewed the chest CT with Mr. Cumpian. There are 2 tiny right-sided lung lesions. The pleural-based lesion in the right chest is likely related to surgery. I will present his case at the GI tumor conference to discuss the indication for continued surveillance versus resection/radiation to the lung lesions.  He has a rectal stricture and intermittent bleeding. I will discuss with Dr. Marcello Moores.  Betsy Coder, MD  11/09/2013  4:21 PM

## 2013-11-12 NOTE — Progress Notes (Signed)
11/12/2013 Late entry for 11/09/2013: Patient in to clinic on 11/09/13 for routine follow-up visit. Met with patient briefly to discuss follow-up data collection and patient is in agreement to continue in long-term follow-up. Patient is aware that new information will be collected approximately every 3 months, and that he will be notified if there is any new information pertaining to the research study that may affect his willingness to continue his participation. No new adverse events related to protocol therapy have been identified by Dr. Benay Spice. Thanked patient for his participation in the trial. Cindy S. Brigitte Pulse BSN, RN, [place] 11/12/2013 2:56 PM

## 2013-11-29 ENCOUNTER — Ambulatory Visit (INDEPENDENT_AMBULATORY_CARE_PROVIDER_SITE_OTHER): Payer: BC Managed Care – PPO | Admitting: Cardiothoracic Surgery

## 2013-11-29 ENCOUNTER — Encounter: Payer: Self-pay | Admitting: Cardiothoracic Surgery

## 2013-11-29 VITALS — BP 120/80 | HR 83 | Ht 70.0 in | Wt 179.0 lb

## 2013-11-29 DIAGNOSIS — R918 Other nonspecific abnormal finding of lung field: Secondary | ICD-10-CM

## 2013-11-29 HISTORY — DX: Other nonspecific abnormal finding of lung field: R91.8

## 2013-11-29 NOTE — Progress Notes (Signed)
[address]       [place] [address]             [phone]                    Jacob [name] [facility] Medical Record #[id number] Date of Birth: [birth year]  Referring: Ladell Pier, MD Primary Care: Penni Homans, MD  Chief Complaint:    Chief Complaint  Patient presents with  . F/U THORACIC    3 MO F/ VISIT    History of Present Illness:    Jacob [name] 43 y.o. male is seen in the office  Today at the request of Dr. Benay Spice for a 4 mm left lung nodule. He has been treated for rectal cancer, followup scans have noted potential liver metastasis. The patient is being considered for liver resection. His previous history is outlined:   1. Rectal Cancer Partially obstructing mass noted 1-2 cm from the anal verge on a colonoscopy 03/06/2012. Endoscopic ultrasound 03/14/2012 with a 7.5 mm thick, 3.2 cm wide hypoechoic, irregularly bordered mass that clearly passed into and through the muscularis propria layer of the distal rectal wall (uT3); 3 small (largest 7 mm) perirectal lymph nodes. The lymph nodes were all round, discrete, hypoechoic, homogenous; suspicious for malignant involvement (uN1).  He began radiation and concurrent Xeloda chemotherapy on 03/20/2012, completed 04/27/2012.  Low anterior resection/coloanal anastomosis and diverting ileostomy 06/29/2012 with the final pathology revealing a T2N0 tumor with extensive fibrosis and negative margins.  Cycle 1 of adjuvant CAPOX 07/19/2012. Cycle 5 of adjuvant CAPOX 10/11/2012.  CEA 2.5 on 11/14/2012.  CEA 14.6 03/08/2013.  Restaging CT evaluation 03/08/2013 with a 4 mm pulmonary nodule left lung base not identified on comparison exam; new liver lesions including a 29 x 26 mm irregular peripheral enhancing rounded lesion in the dome of the right hepatic lobe, a less well-defined new subcapsular lesion in the lateral right hepatic lobe measuring 12 mm, a new subcapsular lesion in the anterior right hepatic lobe  adjacent to the gallbladder fossa measuring 10 mm; a rounded low-density lesion in the inferior right hepatic lobe measuring 10 mm compared to 7 mm on the prior study (radiologist commented this may represent an enlarged cyst).  MRI of the abdomen 04/12/2013 confirmed multiple T2 hyperintense metastatic lesions throughout the right liver  Initiation of FOLFIRI/Avastin with genotype based irinotecan dosing per the [facility] study 04/05/2013.  Restaging CT evaluation on 06/20/2013 (after 2 cycles/4 treatments) showed improvement in the liver metastases and stable size of a left lower lobe pulmonary nodule now with central cavitation.  Continuation of FOLFIRI/Avastin.  Restaging CT evaluation 08/14/2013-decrease in the size of liver metastases, slight decrease in the size of a cavitary left lower lobe nodule, no evidence of disease progression  Since last seen the patient underwent resection. About 3 weeks ago he noted significant right pleuritic pain especially with a deep breath.  A CT scan was done on August 18, the report does not mention possibility of pulmonary embolus.   Current Activity/ Functional Status:  Patient is independent with mobility/ambulation, transfers, ADL's, IADL's.   Zubrod Score: At the time of surgery this patient's most appropriate activity status/level should be described as: [x]     0    Normal activity, no symptoms []     1    Restricted in physical strenuous activity but ambulatory, able to do out light work []     2    Ambulatory  and capable of self care, unable to do work activities, up and about               >50 % of waking hours                              []     3    Only limited self care, in bed greater than 50% of waking hours []     4    Completely disabled, no self care, confined to bed or chair []     5    Moribund   Past Medical History  Diagnosis Date  . Chicken pox as a child    ?  . Hernia 6 months old  . Allergic state 01/19/2012  . Reflux 01/19/2012    . Elevated liver function tests 01/19/2012  . Overweight(278.02) 01/19/2012  . Preventative health care 01/19/2012  . Sun-damaged skin 01/19/2012  . Migraine headache as a child  . Hx of migraines   . Radiation 03/20/12-04/27/12    Pelvis 50.4 gray Rectal cancer  . Anxiety   . Fatty liver   . 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 Cancer 03/06/12    rectal bx=Adenocarcinoma  PT HAD RADIATION , CHEMO SURGERY  . Numbness     TOES OF BOTH FEET.  . Pain     LEFT HEEL PAIN -SEVERE ESPECIALLY AFTER SITTING OR LYING DOWN - DIFFICULT TO GET OUT OF BED AND WALK IN THE MORNINGS BECAUSE OF HEEL PAIN  . Anemia 12/31/2012  . Dysrhythmia as child    HX OF IRREGULAR HB AT TIME OF TONSILLECTOMY - SURGERY WAS NOT DONE-CAN'T REMEMBER ANY OTHER DETAILS- NEVER HAD THE SURGERY.  . 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 Lung abnormality 2015  . History of diarrhea     better since chemo finished  . Lung nodule 08/2013    Past Surgical History  Procedure Laterality Date  . Rectal biopsy  03/06/12    distal mass1-2cm from anal verge=Adenocarcinoma  . Eus  03/14/2012    Procedure: LOWER ENDOSCOPIC ULTRASOUND (EUS);  Surgeon: Milus Banister, MD;  Location: [facility];  Service: Endoscopy;  Laterality: N/A;  . Hernia repair  65 months old    right inguinal repair  . Cyst excision      L EAR AREA  . Laparoscopic low anterior resection N/A 06/29/2012    Procedure: LAPAROSCOPIC LOW ANTERIOR RESECTION, mobilization splenic flexure,coloanal anastomosis,diverting ileostomy;  Surgeon: Leighton Ruff, MD;  Location: [facility];  Service: General;  Laterality: N/A;  . Ileostomy closure N/A 12/07/2012    Procedure: ILEOSTOMY TAKEDOWN;  Surgeon: Leighton Ruff, MD;  Location: [facility];  Service: General;  Laterality: N/A;  . Portacath placement Left 03/21/2013    Procedure: INSERTION PORT-A-CATH;  Surgeon: Leighton Ruff, MD;  Location: [facility];  Service:  General;  Laterality: Left;  . Open hepatectomy [83] N/A 09/17/2013    Procedure: OPEN HEPATECTOMY;  Surgeon: Stark Klein, MD;  Location: [facility];  Service: General;  Laterality: N/A;  . Laparoscopy N/A 09/17/2013    Procedure: LAPAROSCOPY DIAGNOSTIC;  Surgeon: Stark Klein, MD;  Location: [facility];  Service: General;  Laterality: N/A;  . Liver ultrasound N/A 09/17/2013    Procedure: LIVER ULTRASOUND;  Surgeon: Stark Klein, MD;  Location: [facility];  Service: General;  Laterality: N/A;  . Cholecystectomy N/A 09/17/2013  Procedure: CHOLECYSTECTOMY;  Surgeon: Stark Klein, MD;  Location: [facility];  Service: General;  Laterality: N/A;    Family History  Problem Relation Age of Onset  . Adopted: Yes  . Cancer Father   . Heart disease Father   . Colon cancer Neg Hx       History  Smoking status  . Never Smoker   Smokeless tobacco  . Never Used    History  Alcohol Use  . Yes    Comment: RARE      Allergies  Allergen Reactions  . Penicillins Rash    Does not recall    Current Outpatient Prescriptions  Medication Sig Dispense Refill  . ALPRAZolam (XANAX) 0.25 MG tablet Take 0.5 mg by mouth daily as needed for anxiety or sleep.      Marland Kitchen docusate sodium (COLACE) 100 MG capsule Take 100 mg by mouth daily as needed for mild constipation or moderate constipation. Occasionally bid      . naproxen sodium (ALEVE) 220 MG tablet Take 220 mg by mouth daily as needed (for headache).       No current facility-administered medications for this visit.     Review of Systems:     Cardiac Review of Systems: Y or N  Chest Pain [ n   ]  Resting SOB [  n ] Exertional SOB  [n  ]  Orthopnea [n  ]   Pedal Edema Florencio.Farrier   ]    Palpitations Florencio.Farrier  ] Syncope  [ n ]   Presyncope [  n ]  General Review of Systems: [Y] = yes [  ]=no Constitional: recent weight change [n  ];  Wt loss over the last 3 months [   ] anorexia [  ]; fatigue Blue.Reese  ]; nausea [ n ]; night sweats [  ]; fever [  ]; or chills [  ];          Dental:  poor dentition[  ]; Last Dentist visit:   Eye : blurred vision [  ]; diplopia [   ]; vision changes [  ];  Amaurosis fugax[  ]; Resp: cough [  ];  wheezing[  ];  hemoptysis[  ]; shortness of breath[  ]; paroxysmal nocturnal dyspnea[  ]; dyspnea on exertion[  ]; or orthopnea[  ];  GI:  gallstones[n  ], vomiting[ n ];  dysphagia[  ]; melena[  ];  hematochezia [  ]; heartburn[  ];   Hx of  Colonoscopy[ y ]; GU: kidney stones [  ]; hematuria[  ];   dysuria [  ];  nocturia[  ];  history of     obstruction [  ]; urinary frequency [  ]             Skin: rash, swelling[  ];, hair loss[  ];  peripheral edema[  ];  or itching[  ]; Musculosketetal: myalgias[  ];  joint swelling[  ];  joint erythema[  ];  joint pain[  ];  back pain[  ];  Heme/Lymph: bruising[  ];  bleeding[  ];  anemia[  ];  Neuro: TIA[n  ];  headaches[  ];  stroke[  ];  vertigo[  ];  seizures[  ];   paresthesias[  ];  difficulty walking[  ];  Psych:depression[  ]; anxiety[  ];  Endocrine: diabetes[n  ];  thyroid dysfunction[ n ];  Immunizations: Flu up to date [  ]; Pneumococcal up to date [  ];  Other:loose  stoo  Physical Exam: BP 120/80  Pulse 83  Ht 5\' 10"  (1.778 m)  Wt 179 lb (81.194 kg)  BMI 25.68 kg/m2  SpO2 97%  PHYSICAL EXAMINATION:  General appearance: alert, cooperative and appears stated age Neurologic: intact Heart: regular rate and rhythm, S1, S2 normal, no murmur, click, rub or gallop Lungs: clear to auscultation bilaterally Abdomen: soft, non-tender; bowel sounds normal; no masses,  no organomegaly Extremities: extremities normal, atraumatic, no cyanosis or edema and Homans sign is negative, no sign of DVT Wound: Well-healed abdominal incisions from previous ileostomy Incision from hepatic resection is well-healed.  Diagnostic Studies & Laboratory data:     Recent Radiology Findings: Ct Chest W Contrast  11/06/2013   CLINICAL DATA:  History of rectal cancer. Status post chemo and XRT.  EXAM: CT CHEST WITH  CONTRAST  TECHNIQUE: Multidetector CT imaging of the chest was performed during intravenous contrast administration.  CONTRAST:  28mL OMNIPAQUE IOHEXOL 300 MG/ML  SOLN  COMPARISON:  08/14/2013  FINDINGS: No pleural effusion identified. Within the right posterior lung base there is a new subpleural nodule which measures 2.6 x 1.8 cm, image 47/series 5. This appears to be associated with postoperative changes from right hepatectomy. Within the left lower lobe there is a nodule which 4 mm, image 37/series 5. This is unchanged from previous exam. Within the posterior left lower lobe there is a 5 mm nodule, image 40/series 5. Previously this measured 2-3 mm.  The heart size appears normal. No pericardial effusion. No mediastinal or hilar adenopathy. There is no enlarged supraclavicular or axillary adenopathy.  Incidental imaging through the upper abdomen demonstrates postoperative changes from right hepatectomy. No mass identified within the remaining portions of the liver.  Review of the visualized osseous structures shows no aggressive lytic or sclerotic bone lesion.  IMPRESSION: 1. No acute cardiopulmonary abnormalities. 2. New subpleural nodule in the posterior right lung base is indeterminate. This appears to be related to postoperative change from right hepatectomy. Local tumor recurrence is less favored. Consider further assessment with PET-CT versus close interval follow-up. 3. There are 2 nodules in the left lower lobe. One of these is slightly increased in size from previous exam which now measures 5 mm (versus 2 mm previously.)   Electronically Signed   By: Kerby Moors M.D.   On: 11/06/2013 09:18    Ct Chest W Contrast/Ct Abdomen Pelvis W Contrast  08/14/2013   CLINICAL DATA:  Restaging rectal cancer. History of colon resection and metastatic disease. Chemotherapy and radiation therapy completed.  EXAM: CT CHEST, ABDOMEN, AND PELVIS WITH CONTRAST  TECHNIQUE: Multidetector CT imaging of the chest, abdomen  and pelvis was performed following the standard protocol during bolus administration of intravenous contrast.  CONTRAST:  149mL OMNIPAQUE IOHEXOL 300 MG/ML  SOLN  COMPARISON:  Prior examinations 06/20/2013 (CT) and abdominal MRI 04/11/2013.  FINDINGS: RECIST 1.1  Target Lesions:  1. Lesion in the dome of the right hepatic lobe measures 1.4 cm on image 43 (previously 1.7 cm). 2. Subcapsular lesion in the posterior segment of the right hepatic lobe measures 5 mm on image 49 (previously 6 mm). Non-target Lesions:  1. The previously demonstrated cavitary left lower lobe pulmonary nodule is subjectively smaller, but measures 4 mm on image 32 (4 mm previously). 2. See below.  CT CHEST FINDINGS  Left subclavian Port-A-Cath tip is in the lower SVC. There are no enlarged mediastinal, hilar or axillary lymph nodes. The thyroid gland and thoracic inlet appear normal.  There is no  pleural or pericardial effusion. 4 mm cavitary nodule in the left lower lobe on image 32 is subjectively slightly smaller. Tiny subpleural left lower lobe nodule on image 36 is now nearly inconspicuous. No new or enlarging pulmonary nodules are identified. There is no confluent airspace opacity or endobronchial lesion.  There are no worrisome osseous findings.  CT ABDOMEN AND PELVIS FINDINGS  The dominant metastasis involving the dome of the right hepatic lobe has slightly decreased in size, measuring 1.4 cm on image 43 (previously 1.7 cm. Cystic lesion inferiorly in the right hepatic lobe measures 11 mm (9 mm previously). This did not appear to reflect a metastasis on MRI. Other small lesions are stable.  The spleen, gallbladder, pancreas, adrenal glands and kidneys appear normal.  Small retroperitoneal lymph nodes are stable. There is no adenopathy, ascites or peritoneal nodularity. There are no significant vascular findings.  There is moderate stool throughout the colon. No focal mucosal lesions are identified. Postsurgical changes within the  anterior abdominal wall and presacral fibrotic changes are stable. The bladder, prostate gland and seminal vesicles appear stable.  There are no worrisome osseous findings.  IMPRESSION: 1. Continued response to therapy in hepatic metastatic disease. 2. The cavitary left lower lobe pulmonary nodule also appears subjectively smaller. 3. No disease progression identified. 4. Stable presacral fibrotic changes.   Electronically Signed   By: Camie Patience M.D.   On: 08/14/2013 09:19    Recent Lab Findings: Lab Results  Component Value Date   WBC 4.4 09/24/2013   HGB 8.2* 09/24/2013   HCT 25.1* 09/24/2013   PLT 70* 09/24/2013   GLUCOSE 90 11/06/2013   ALT 32 11/06/2013   AST 35* 11/06/2013   NA 140 11/06/2013   K 3.8 11/06/2013   CL 102 09/24/2013   CREATININE 0.8 11/06/2013   BUN 8.8 11/06/2013   CO2 24 11/06/2013   TSH 1.549 12/28/2012   INR 1.03 09/12/2013      Assessment / Plan:   History of rectal cancer, with probable liver metastasis and a 4 mm left lung nodule not definitely present on CT scan in 2013. I will plan to see the patient back in 3 months and we'll coordinate with Dr. Benay Spice followup scans to evaluate any change in lung nodules. The new appearing lesion on the right is less likely to be metastatic disease, may be related to the previous liver resection or a postoperative pulmonary embolus. The patient now is without shortness of breath or chest pain. The left lesions have enlarged very slightly 1-2 mm. At this point I would recommend a followup CT scan in 3-4 months after his previous scan. Depending on the results of the followup scan arranged by oncology I will see the patient as needed.    I spent 30 minutes counseling the patient face to face. The total time spent in the appointment was 50 minutes.  Grace Isaac MD      [address] [place] [address] Office [phone]   Beeper [phone]  11/29/2013 10:51 AM

## 2013-12-18 ENCOUNTER — Ambulatory Visit (HOSPITAL_BASED_OUTPATIENT_CLINIC_OR_DEPARTMENT_OTHER): Payer: BC Managed Care – PPO

## 2013-12-18 ENCOUNTER — Ambulatory Visit (HOSPITAL_BASED_OUTPATIENT_CLINIC_OR_DEPARTMENT_OTHER): Payer: BC Managed Care – PPO | Admitting: Nurse Practitioner

## 2013-12-18 ENCOUNTER — Telehealth: Payer: Self-pay | Admitting: Oncology

## 2013-12-18 ENCOUNTER — Other Ambulatory Visit: Payer: BC Managed Care – PPO

## 2013-12-18 VITALS — BP 131/79 | HR 80 | Temp 98.2°F | Resp 18 | Ht 70.0 in | Wt 182.7 lb

## 2013-12-18 DIAGNOSIS — C251 Malignant neoplasm of body of pancreas: Secondary | ICD-10-CM

## 2013-12-18 DIAGNOSIS — C20 Malignant neoplasm of rectum: Secondary | ICD-10-CM

## 2013-12-18 DIAGNOSIS — G620 Drug-induced polyneuropathy: Secondary | ICD-10-CM

## 2013-12-18 DIAGNOSIS — C787 Secondary malignant neoplasm of liver and intrahepatic bile duct: Secondary | ICD-10-CM

## 2013-12-18 DIAGNOSIS — Z95828 Presence of other vascular implants and grafts: Secondary | ICD-10-CM

## 2013-12-18 DIAGNOSIS — K624 Stenosis of anus and rectum: Secondary | ICD-10-CM

## 2013-12-18 DIAGNOSIS — R748 Abnormal levels of other serum enzymes: Secondary | ICD-10-CM

## 2013-12-18 LAB — CEA: CEA: 0.7 ng/mL (ref 0.0–5.0)

## 2013-12-18 MED ORDER — SODIUM CHLORIDE 0.9 % IJ SOLN
10.0000 mL | INTRAMUSCULAR | Status: DC | PRN
  Administered 2013-12-18: 10 mL via INTRAVENOUS
  Filled 2013-12-18: qty 10

## 2013-12-18 MED ORDER — HEPARIN SOD (PORK) LOCK FLUSH 100 UNIT/ML IV SOLN
500.0000 [IU] | Freq: Once | INTRAVENOUS | Status: AC
  Administered 2013-12-18: 500 [IU] via INTRAVENOUS
  Filled 2013-12-18: qty 5

## 2013-12-18 MED ORDER — LIDOCAINE-PRILOCAINE 2.5-2.5 % EX CREA: 1.0000 "application " | TOPICAL_CREAM | CUTANEOUS | Status: DC | PRN

## 2013-12-18 MED ORDER — ALPRAZOLAM 0.25 MG PO TABS: 0.5000 mg | ORAL_TABLET | Freq: Every day | ORAL | Status: DC | PRN

## 2013-12-18 NOTE — Patient Instructions (Signed)

## 2013-12-18 NOTE — Progress Notes (Signed)
[facility] OFFICE PROGRESS NOTE   Diagnosis:  Rectal cancer  INTERVAL HISTORY:   Mr. Cortez returns as scheduled. Energy level continues to improve. He has aood appetite. No further chest pain. He denies shortness of breath. Yesterday and today he has had some low back pain and right-sided abdominal pain. The pain tends to occur with constipation. He denies fever. No urinary symptoms.  Objective:  Vital signs in last 24 hours:  Blood pressure 131/79, pulse 80, temperature 98.2 F (36.8 C), temperature source Oral, resp. rate 18, height 5\' 10"  (1.778 m), weight 182 lb 11.2 oz (82.872 kg), SpO2 98.00%.    HEENT: No thrush or ulcers. Lymphatics: No palpable cervical, supraclavicular or axillary lymph nodes. Resp: Lungs clear bilaterally. Cardio: Regular rate and rhythm. GI: Abdomen soft. Tender over the right upper quadrant. No hepatomegaly. Vascular: No leg edema.  Port-A-Cath without erythema.    Lab Results:  Lab Results  Component Value Date   WBC 4.4 09/24/2013   HGB 8.2* 09/24/2013   HCT 25.1* 09/24/2013   MCV 92.6 09/24/2013   PLT 70* 09/24/2013   NEUTROABS 3.4 09/12/2013    Imaging:  No results found.  Medications: I have reviewed the patient's current medications.  Assessment/Plan: 1. Rectal cancer. Partially obstructing mass noted 1-2 cm from the anal verge on a colonoscopy 03/06/2012. Endoscopic ultrasound 03/14/2012 with a 7.5 mm thick, 3.2 cm wide hypoechoic, irregularly bordered mass that clearly passed into and through the muscularis propria layer of the distal rectal wall (uT3); 3 small (largest 7 mm) perirectal lymph nodes. The lymph nodes were all round, discrete, hypoechoic, homogenous; suspicious for malignant involvement (uN1).  He began radiation and concurrent Xeloda chemotherapy on 03/20/2012, completed 04/27/2012.  Low anterior resection/coloanal anastomosis and diverting ileostomy 06/29/2012 with the final pathology revealing a T2N0 tumor  with extensive fibrosis and negative margins.  Cycle 1 of adjuvant CAPOX 07/19/2012. Cycle 5 of adjuvant CAPOX 10/11/2012.  CEA 2.5 on 11/14/2012.  CEA 14.6 03/08/2013.  Restaging CT evaluation 03/08/2013 with a 4 mm pulmonary nodule left lung base not identified on comparison exam; new liver lesions including a 29 x 26 mm irregular peripheral enhancing rounded lesion in the dome of the right hepatic lobe, a less well-defined new subcapsular lesion in the lateral right hepatic lobe measuring 12 mm, a new subcapsular lesion in the anterior right hepatic lobe adjacent to the gallbladder fossa measuring 10 mm; a rounded low-density lesion in the inferior right hepatic lobe measuring 10 mm compared to 7 mm on the prior study (radiologist commented this may represent an enlarged cyst).  MRI of the abdomen 04/12/2013 confirmed multiple T2 hyperintense metastatic lesions throughout the right liver  Initiation of FOLFIRI/Avastin with genotype based irinotecan dosing per the [facility] study 04/05/2013.  Restaging CT evaluation on 06/20/2013 (after 2 cycles/4 treatments) showed improvement in the liver metastases and stable size of a left lower lobe pulmonary nodule now with central cavitation.  Continuation of FOLFIRI/Avastin.  Restaging CT evaluation 08/14/2013-decrease in the size of liver metastases, slight decrease in the size of a cavitary left lower lobe nodule, no evidence of disease progression.  Status post right hepatic lobectomy 09/17/2013. Pathology showed multiple foci of metastatic adenocarcinoma (5 nodules of metastatic adenocarcinoma 4 of which are subcapsular with the nodules ranging in size from 0.6-1.8 cm in greatest dimension). Margins not involved. Biopsy of a portal lymph node showed benign adipose tissue; no lymph node tissue or malignancy.  Normal CEA 11/06/2013  CT 11/06/2013 with  a new pleural-based right lower chest lesion, slight in enlargement of the a left-sided lung nodule 2. Irregular  bowel habits/rectal bleeding secondary to #1. 3. Mild elevation of the liver enzymes. Question secondary to hepatic steatosis. 4. Indeterminate 8 mm posterior right liver lesion on the staging CT 03/06/2012. 5. Mildly elevated CEA at 5.7 on 03/06/2012. 6. History of radiation erythema at the groin and perineum 7. 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 No clinical evidence of thrombophlebitis or extravasation. 8. Delayed nausea following chemotherapy-Decadron prophylaxis was added with cycle 3 CAPOX-improved. 9. Ileostomy takedown 12/07/2012. 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 11. Port-A-Cath placement 12/31/204 12. Severe neutropenia secondary to chemotherapy following cycle 1 of FOLFIRI, chemotherapy was dose reduced and he received Neulasta with day 15 cycle 1  13. Nausea and vomiting following cycle 1 of FOLFIRI-the  14. Rectal stricture   Disposition: Mr. Kowalewski appears stable. He has seen Dr. Servando Snare regarding the lung nodules. Dr. Servando Snare recommends repeating a CT scan at a 3 to four-month interval from the prior scan. We made a referral for labs and a chest CT on 01/28/2014. He will return for a followup visit on 01/30/2014 to review the results. He will contact the office in the interim with any problems.  We provided him with the phone number to the pelvic physical therapy program should he decide he would like to pursue additional followup with Earlie Counts, PT.     Ned Card ANP/GNP-BC   12/18/2013  9:40 AM

## 2013-12-18 NOTE — Telephone Encounter (Signed)
[facility] PT APPT SCHEDULE FOR NOV. CENTRAL WILL CALL RE CT - PT AWARE.

## 2013-12-18 NOTE — Patient Instructions (Signed)
Jacob [name], [phone]

## 2014-01-28 ENCOUNTER — Other Ambulatory Visit (HOSPITAL_BASED_OUTPATIENT_CLINIC_OR_DEPARTMENT_OTHER): Payer: BC Managed Care – PPO

## 2014-01-28 ENCOUNTER — Ambulatory Visit (HOSPITAL_BASED_OUTPATIENT_CLINIC_OR_DEPARTMENT_OTHER): Payer: BC Managed Care – PPO

## 2014-01-28 ENCOUNTER — Ambulatory Visit (HOSPITAL_COMMUNITY)
Admission: RE | Admit: 2014-01-28 | Discharge: 2014-01-28 | Disposition: A | Discharge status: Home / Self Care | Payer: BC Managed Care – PPO | Source: Ambulatory Visit | Attending: Nurse Practitioner | Admitting: Nurse Practitioner

## 2014-01-28 ENCOUNTER — Other Ambulatory Visit: Payer: Self-pay | Admitting: *Deleted

## 2014-01-28 ENCOUNTER — Encounter (HOSPITAL_COMMUNITY): Payer: Self-pay

## 2014-01-28 DIAGNOSIS — C20 Malignant neoplasm of rectum: Secondary | ICD-10-CM | POA: Insufficient documentation

## 2014-01-28 DIAGNOSIS — C787 Secondary malignant neoplasm of liver and intrahepatic bile duct: Secondary | ICD-10-CM

## 2014-01-28 DIAGNOSIS — R918 Other nonspecific abnormal finding of lung field: Secondary | ICD-10-CM | POA: Insufficient documentation

## 2014-01-28 DIAGNOSIS — Z452 Encounter for adjustment and management of vascular access device: Secondary | ICD-10-CM

## 2014-01-28 DIAGNOSIS — Z95828 Presence of other vascular implants and grafts: Secondary | ICD-10-CM

## 2014-01-28 LAB — COMPREHENSIVE METABOLIC PANEL (CC13)
ALBUMIN: 3.3 g/dL — AB (ref 3.5–5.0)
ALT: 29 U/L (ref 0–55)
ANION GAP: 7 meq/L (ref 3–11)
AST: 26 U/L (ref 5–34)
Alkaline Phosphatase: 82 U/L (ref 40–150)
BUN: 11.6 mg/dL (ref 7.0–26.0)
CALCIUM: 8.7 mg/dL (ref 8.4–10.4)
CHLORIDE: 108 meq/L (ref 98–109)
CO2: 26 mEq/L (ref 22–29)
Creatinine: 0.8 mg/dL (ref 0.7–1.3)
GLUCOSE: 96 mg/dL (ref 70–140)
Potassium: 3.6 mEq/L (ref 3.5–5.1)
Sodium: 141 mEq/L (ref 136–145)
Total Bilirubin: 0.29 mg/dL (ref 0.20–1.20)
Total Protein: 6.1 g/dL — ABNORMAL LOW (ref 6.4–8.3)

## 2014-01-28 LAB — CEA: CEA: 0.7 ng/mL (ref 0.0–5.0)

## 2014-01-28 MED ORDER — IOHEXOL 300 MG/ML  SOLN
80.0000 mL | Freq: Once | INTRAMUSCULAR | Status: AC | PRN
  Administered 2014-01-28: 80 mL via INTRAVENOUS

## 2014-01-28 MED ORDER — SODIUM CHLORIDE 0.9 % IJ SOLN
10.0000 mL | INTRAMUSCULAR | Status: DC | PRN
  Administered 2014-01-28: 10 mL via INTRAVENOUS
  Filled 2014-01-28: qty 10

## 2014-01-28 MED ORDER — HEPARIN SOD (PORK) LOCK FLUSH 100 UNIT/ML IV SOLN
500.0000 [IU] | Freq: Once | INTRAVENOUS | Status: AC
  Administered 2014-01-28: 500 [IU] via INTRAVENOUS
  Filled 2014-01-28: qty 5

## 2014-01-28 NOTE — Patient Instructions (Signed)

## 2014-01-30 ENCOUNTER — Telehealth: Payer: Self-pay | Admitting: Oncology

## 2014-01-30 ENCOUNTER — Telehealth: Payer: Self-pay | Admitting: *Deleted

## 2014-01-30 ENCOUNTER — Ambulatory Visit (HOSPITAL_BASED_OUTPATIENT_CLINIC_OR_DEPARTMENT_OTHER): Payer: BC Managed Care – PPO | Admitting: Oncology

## 2014-01-30 VITALS — BP 139/89 | HR 70 | Temp 97.9°F | Resp 18 | Ht 70.0 in | Wt 179.9 lb

## 2014-01-30 DIAGNOSIS — Z23 Encounter for immunization: Secondary | ICD-10-CM

## 2014-01-30 DIAGNOSIS — R918 Other nonspecific abnormal finding of lung field: Secondary | ICD-10-CM

## 2014-01-30 DIAGNOSIS — C20 Malignant neoplasm of rectum: Secondary | ICD-10-CM

## 2014-01-30 DIAGNOSIS — C787 Secondary malignant neoplasm of liver and intrahepatic bile duct: Secondary | ICD-10-CM

## 2014-01-30 MED ORDER — INFLUENZA VAC SPLIT QUAD 0.5 ML IM SUSY
0.5000 mL | PREFILLED_SYRINGE | Freq: Once | INTRAMUSCULAR | Status: AC
Start: 2014-01-30 — End: 2014-01-30
  Administered 2014-01-30: 0.5 mL via INTRAMUSCULAR
  Filled 2014-01-30: qty 0.5

## 2014-01-30 NOTE — Telephone Encounter (Signed)
gv adn printed appt sched and avs for pt fjor DEC

## 2014-01-30 NOTE — Telephone Encounter (Signed)
Sent email to Maudie Mercury in pathology requesting pt's case to be added to 11/18 GI conference. Radiology only, per Dr. Benay Spice.

## 2014-01-30 NOTE — Progress Notes (Signed)
Jacob [name] OFFICE PROGRESS NOTE   Diagnosis: rectal cancer  INTERVAL HISTORY:   Jacob [name] returns as scheduled. He feels well. He is working. He had an episode of constipation lasting for 1 week. He had a sinus infection last week with congestion in the sinuses and a cough. These symptoms have improved. No fever or dyspnea.  Objective:  Vital signs in last 24 hours:  Blood pressure 139/89, pulse 70, temperature 97.9 F (36.6 C), temperature source Oral, resp. rate 18, height 5\' 10"  (1.778 m), weight 179 lb 14.4 oz (81.602 kg).    HEENT: neck without mass Lymphatics: no cervical, supra-clavicular, or inguinal nodes. 1/2-170 or mobile left axillary node. 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 Followup indeterminate pulmonary nodules.  EXAM: CT CHEST WITH CONTRAST  TECHNIQUE: Multidetector CT imaging of the chest was performed during intravenous contrast administration.  CONTRAST:  75mL OMNIPAQUE IOHEXOL 300 MG/ML  SOLN  COMPARISON:  11/06/2013  FINDINGS: RECIST 1.1  Target Lesions:  1. Right hepatic lobe -resected Non-target Lesions:  1. Left lower lobe pulmonary nodule on image 34 of series 5, currently measuring 7 mm compared to 4 mm previously 2. Subpleural right lower lobe nodule on image 46 of series 5, currently measuring 12 x 13 mm compared to 18 x 26 mm previously Mediastinum/Hilar Regions: 9 mm high right paratracheal lymph node on image number 15 is stable. No pathologically enlarged nodes identified.  Other Thoracic Lymphadenopathy:  None.  Lungs:  Subpleural nodule in the posterior right lower lobe shows significant decrease in size since previous study, currently measuring 1.2 x 1.3 cm on image 46, compared with 1.8 x 2.6 cm previously.  A pulmonary nodule in the left lower lobe on image 34 shows further increase in size, currently measuring 7 mm compared with 4 mm previously. Other pulmonary nodules in the left lower lobe measuring 5 mm on image 37 and in the left upper lobe measuring 4 mm on image 8 remains stable.  Pleura:  No evidence of effusion or mass.  Vascular/Cardiac:  No acute findings identified.  Other: Postop changes again seen from previous liver resection. No mass is seen in the visualized portion liver.  Musculoskeletal:  No suspicious bone lesions identified.  IMPRESSION: Mild increase in size of 7 mm pulmonary nodule in the left lower lobe. Other sub-cm left lung nodules are stable.  Decreased size of subpleural nodule in the posterior right lower lobe.  Stable 9 mm high right paratracheal lymph node.   Electronically Signed   By: Earle Gell M.D.   On: 01/28/2014 09:06    Medications: I have reviewed the patient's current medications.  Assessment/Plan: 1. Rectal cancer. Partially obstructing mass noted 1-2 cm from the anal verge on a colonoscopy 03/06/2012. Endoscopic ultrasound 03/14/2012 with a 7.5 mm thick, 3.2 cm wide hypoechoic, irregularly bordered mass that clearly passed into and through the muscularis propria layer of the distal rectal wall (uT3); 3 small (largest 7 mm) perirectal lymph nodes. The lymph nodes were all round, discrete, hypoechoic, homogenous; suspicious for malignant involvement (uN1).  He began radiation and  concurrent Xeloda chemotherapy on 03/20/2012, completed 04/27/2012.   Low anterior resection/coloanal anastomosis and diverting ileostomy 06/29/2012 with the final pathology revealing a T2N0 tumor with extensive fibrosis and negative margins.   Cycle 1 of adjuvant CAPOX 07/19/2012. Cycle 5 of  adjuvant CAPOX 10/11/2012.   CEA 2.5 on 11/14/2012.   CEA 14.6 03/08/2013.   Restaging CT evaluation 03/08/2013 with a 4 mm pulmonary nodule left lung base not identified on comparison exam; new liver lesions including a 29 x 26 mm irregular peripheral enhancing rounded lesion in the dome of the right hepatic lobe, a less well-defined new subcapsular lesion in the lateral right hepatic lobe measuring 12 mm, a new subcapsular lesion in the anterior right hepatic lobe adjacent to the gallbladder fossa measuring 10 mm; a rounded low-density lesion in the inferior right hepatic lobe measuring 10 mm compared to 7 mm on the prior study (radiologist commented this may represent an enlarged cyst).   MRI of the abdomen 04/12/2013 confirmed multiple T2 hyperintense metastatic lesions throughout the right liver   Initiation of FOLFIRI/Avastin with genotype based irinotecan dosing per the [facility] study 04/05/2013.   Restaging CT evaluation on 06/20/2013 (after 2 cycles/4 treatments) showed improvement in the liver metastases and stable size of a left lower lobe pulmonary nodule now with central cavitation.   Continuation of FOLFIRI/Avastin.   Restaging CT evaluation 08/14/2013-decrease in the size of liver metastases, slight decrease in the size of a cavitary left lower lobe nodule, no evidence of disease progression.   Status post right hepatic lobectomy 09/17/2013. Pathology showed multiple foci of metastatic adenocarcinoma (5 nodules of metastatic adenocarcinoma 4 of which are subcapsular with the nodules ranging in size from 0.6-1.8 cm in greatest dimension). Margins not involved. Biopsy of a portal lymph node showed benign adipose tissue; no lymph node tissue or malignancy.   Normal CEA 11/06/2013   CT 11/06/2013 with a new pleural-based right lower chest lesion, slight in enlargement of the a left-sided lung nodule  CT 01/29/2014 with a decrease in the right lower chest pleural-based lesion  and a slight increase of a left-sided lung lesion, other lung lesions were stable 2. Irregular bowel habits/rectal bleeding secondary to #1. 3. History of Mild elevation of the liver enzymes. Question secondary to hepatic steatosis. 4. Indeterminate 8 mm posterior right liver lesion on the staging CT 03/06/2012. 5. Mildly elevated CEA at 5.7 on 03/06/2012. 6. History of radiation erythema at the groin and perineum 7. 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 No clinical evidence of thrombophlebitis or extravasation. 8. Delayed nausea following chemotherapy-Decadron prophylaxis was added with cycle 3 CAPOX-improved. 9. Ileostomy takedown 12/07/2012. 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 11. Port-A-Cath placement 12/31/204 12. Severe neutropenia secondary to chemotherapy following cycle 1 of FOLFIRI, chemotherapy was dose reduced and he received Neulasta with day 15 cycle 1  13. Nausea and vomiting following cycle 1 of FOLFIRI 14. Rectal stricture  Disposition:  Jacob [name] appears well. His overall performance status appears improved. He received an influenza vaccine today. I reviewed the CT images with him today. The right sided pleural-based lesion is smaller and was likely related to surgery.One of the left-sided lesions is slightly larger. I will present his case at the GI tumor conference next week to discuss the indication for surgery.Jacob [name] will return for an office visit and Port-A-Cath flush in 6 weeks.  Betsy Coder, MD  01/30/2014  12:50 PM

## 2014-02-21 ENCOUNTER — Ambulatory Visit: Payer: BC Managed Care – PPO | Admitting: Cardiothoracic Surgery

## 2014-03-12 ENCOUNTER — Ambulatory Visit: Payer: BC Managed Care – PPO | Admitting: Nurse Practitioner

## 2014-04-11 ENCOUNTER — Encounter: Payer: Self-pay | Admitting: Oncology

## 2014-04-11 ENCOUNTER — Other Ambulatory Visit: Payer: Self-pay | Admitting: *Deleted

## 2014-04-11 NOTE — Progress Notes (Signed)
Per GI Conference, suggested to observe and re-scan in 4 months. POF to scheduler to get him on 04/25/14 with NP/flush nurse.

## 2014-04-12 ENCOUNTER — Telehealth: Payer: Self-pay | Admitting: Oncology

## 2014-04-12 NOTE — Telephone Encounter (Signed)
no answer....mailed pt appt sched and letter

## 2014-04-12 NOTE — Telephone Encounter (Signed)
Call received from patient.  Inquiring about next appointment.  Informed him to f/u on 04-25-2014 to see Lattie Haw at 12:15 pm followed by flush appointment at 12:30.

## 2014-04-25 ENCOUNTER — Ambulatory Visit (HOSPITAL_BASED_OUTPATIENT_CLINIC_OR_DEPARTMENT_OTHER): Payer: BC Managed Care – PPO

## 2014-04-25 ENCOUNTER — Ambulatory Visit: Payer: BC Managed Care – PPO

## 2014-04-25 ENCOUNTER — Ambulatory Visit (HOSPITAL_BASED_OUTPATIENT_CLINIC_OR_DEPARTMENT_OTHER): Payer: BC Managed Care – PPO | Admitting: Nurse Practitioner

## 2014-04-25 ENCOUNTER — Telehealth: Payer: Self-pay | Admitting: Nurse Practitioner

## 2014-04-25 VITALS — BP 140/92 | HR 69 | Temp 98.3°F | Resp 20 | Ht 70.0 in | Wt 166.7 lb

## 2014-04-25 DIAGNOSIS — C801 Malignant (primary) neoplasm, unspecified: Secondary | ICD-10-CM

## 2014-04-25 DIAGNOSIS — Z95828 Presence of other vascular implants and grafts: Secondary | ICD-10-CM

## 2014-04-25 DIAGNOSIS — C787 Secondary malignant neoplasm of liver and intrahepatic bile duct: Secondary | ICD-10-CM

## 2014-04-25 LAB — COMPREHENSIVE METABOLIC PANEL (CC13)
ALK PHOS: 75 U/L (ref 40–150)
ALT: 30 U/L (ref 0–55)
AST: 30 U/L (ref 5–34)
Albumin: 3.9 g/dL (ref 3.5–5.0)
Anion Gap: 12 mEq/L — ABNORMAL HIGH (ref 3–11)
BILIRUBIN TOTAL: 0.33 mg/dL (ref 0.20–1.20)
BUN: 14.5 mg/dL (ref 7.0–26.0)
CO2: 25 mEq/L (ref 22–29)
Calcium: 9.1 mg/dL (ref 8.4–10.4)
Chloride: 107 mEq/L (ref 98–109)
Creatinine: 0.8 mg/dL (ref 0.7–1.3)
EGFR: >90 mL/min/{1.73_m2} (ref >90)
GLUCOSE: 85 mg/dL (ref 70–140)
Potassium: 3.6 mEq/L (ref 3.5–5.1)
SODIUM: 144 meq/L (ref 136–145)
Total Protein: 6.6 g/dL (ref 6.4–8.3)

## 2014-04-25 LAB — CBC WITH DIFFERENTIAL/PLATELET
BASO%: 0.3 % (ref 0.0–2.0)
Basophils Absolute: 0 10*3/uL (ref 0.0–0.1)
EOS%: 2.1 % (ref 0.0–7.0)
Eosinophils Absolute: 0.1 10*3/uL (ref 0.0–0.5)
HCT: 38.9 % (ref 38.4–49.9)
HEMOGLOBIN: 12.9 g/dL — AB (ref 13.0–17.1)
LYMPH%: 27.4 % (ref 14.0–49.0)
MCH: 28.9 pg (ref 27.2–33.4)
MCHC: 33.2 g/dL (ref 32.0–36.0)
MCV: 87.2 fL (ref 79.3–98.0)
MONO#: 0.3 10*3/uL (ref 0.1–0.9)
MONO%: 10.8 % (ref 0.0–14.0)
NEUT#: 1.7 10*3/uL (ref 1.5–6.5)
NEUT%: 59.4 % (ref 39.0–75.0)
Platelets: 84 10*3/uL — ABNORMAL LOW (ref 140–400)
RBC: 4.46 10*6/uL (ref 4.20–5.82)
RDW: 14.7 % — ABNORMAL HIGH (ref 11.0–14.6)
WBC: 2.9 10*3/uL — ABNORMAL LOW (ref 4.0–10.3)
lymph#: 0.8 10*3/uL — ABNORMAL LOW (ref 0.9–3.3)

## 2014-04-25 MED ORDER — SODIUM CHLORIDE 0.9 % IJ SOLN
10.0000 mL | INTRAMUSCULAR | Status: DC | PRN
  Administered 2014-04-25: 10 mL via INTRAVENOUS
  Filled 2014-04-25: qty 10

## 2014-04-25 MED ORDER — HEPARIN SOD (PORK) LOCK FLUSH 100 UNIT/ML IV SOLN
500.0000 [IU] | Freq: Once | INTRAVENOUS | Status: AC
  Administered 2014-04-25: 500 [IU] via INTRAVENOUS
  Filled 2014-04-25: qty 5

## 2014-04-25 NOTE — Progress Notes (Signed)
Labs/Flush done in ML visit.

## 2014-04-25 NOTE — Telephone Encounter (Signed)
Left message to confirm appointment for March/May.Mailed calendars.

## 2014-04-25 NOTE — Progress Notes (Addendum)
[place] OFFICE PROGRESS NOTE   Diagnosis:  Rectal cancer  INTERVAL HISTORY:   Jacob [name] returns after missing a recent follow-up visit. He reports that overall he feels well. He has a good appetite. He has had some weight loss since his last visit. He attributes this to eating less and being more active. He continues to have erratic bowel habits. No nausea or vomiting. No shortness of breath. No fever.  Objective:  Vital signs in last 24 hours:  Blood pressure 140/92, pulse 69, temperature 98.3 F (36.8 C), temperature source Oral, resp. rate 20, height 5\' 10"  (1.778 m), weight 166 lb 11.2 oz (75.615 kg), SpO2 98 %.    HEENT: No thrush or ulcers. Lymphatics: No palpable cervical, supra clavicular, axillary or inguinal lymph nodes. Resp: Lungs clear bilaterally. Cardio: Regular rate and rhythm. GI: Abdomen soft and nontender. No hepatomegaly. No mass. Vascular: No leg edema.  Port-A-Cath without erythema.    Lab Results:  Lab Results  Component Value Date   WBC 4.4 09/24/2013   HGB 8.2* 09/24/2013   HCT 25.1* 09/24/2013   MCV 92.6 09/24/2013   PLT 70* 09/24/2013   NEUTROABS 3.4 09/12/2013    Imaging:  No results found.  Medications: I have reviewed the patient's current medications.  Assessment/Plan: 1. Rectal cancer. Partially obstructing mass noted 1-2 cm from the anal verge on a colonoscopy 03/06/2012. Endoscopic ultrasound 03/14/2012 with a 7.5 mm thick, 3.2 cm wide hypoechoic, irregularly bordered mass that clearly passed into and through the muscularis propria layer of the distal rectal wall (uT3); 3 small (largest 7 mm) perirectal lymph nodes. The lymph nodes were all round, discrete, hypoechoic, homogenous; suspicious for malignant involvement (uN1).  He began radiation and concurrent Xeloda chemotherapy on 03/20/2012, completed 04/27/2012.   Low anterior resection/coloanal anastomosis and diverting ileostomy 06/29/2012 with the final  pathology revealing a T2N0 tumor with extensive fibrosis and negative margins.   Cycle 1 of adjuvant CAPOX 07/19/2012. Cycle 5 of adjuvant CAPOX 10/11/2012.   CEA 2.5 on 11/14/2012.   CEA 14.6 03/08/2013.   Restaging CT evaluation 03/08/2013 with a 4 mm pulmonary nodule left lung base not identified on comparison exam; new liver lesions including a 29 x 26 mm irregular peripheral enhancing rounded lesion in the dome of the right hepatic lobe, a less well-defined new subcapsular lesion in the lateral right hepatic lobe measuring 12 mm, a new subcapsular lesion in the anterior right hepatic lobe adjacent to the gallbladder fossa measuring 10 mm; a rounded low-density lesion in the inferior right hepatic lobe measuring 10 mm compared to 7 mm on the prior study (radiologist commented this may represent an enlarged cyst).   MRI of the abdomen 04/12/2013 confirmed multiple T2 hyperintense metastatic lesions throughout the right liver   Initiation of FOLFIRI/Avastin with genotype based irinotecan dosing per the [facility] study 04/05/2013.   Restaging CT evaluation on 06/20/2013 (after 2 cycles/4 treatments) showed improvement in the liver metastases and stable size of a left lower lobe pulmonary nodule now with central cavitation.   Continuation of FOLFIRI/Avastin.   Restaging CT evaluation 08/14/2013-decrease in the size of liver metastases, slight decrease in the size of a cavitary left lower lobe nodule, no evidence of disease progression.   Status post right hepatic lobectomy 09/17/2013. Pathology showed multiple foci of metastatic adenocarcinoma (5 nodules of metastatic adenocarcinoma 4 of which are subcapsular with the nodules ranging in size from 0.6-1.8 cm in greatest dimension). Margins not involved. Biopsy of a portal  lymph node showed benign adipose tissue; no lymph node tissue or malignancy.   Normal CEA 11/06/2013   CT 11/06/2013 with a new pleural-based right lower chest lesion,  slight in enlargement of the a left-sided lung nodule  CT 01/29/2014 with a decrease in the right lower chest pleural-based lesion and a slight increase of a left-sided lung lesion, other lung lesions were stable 2. Irregular bowel habits/rectal bleeding secondary to #1. 3. History of Mild elevation of the liver enzymes. Question secondary to hepatic steatosis. 4. Indeterminate 8 mm posterior right liver lesion on the staging CT 03/06/2012. 5. Mildly elevated CEA at 5.7 on 03/06/2012. 6. History of radiation erythema at the groin and perineum 7. 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 No clinical evidence of thrombophlebitis or extravasation. 8. Delayed nausea following chemotherapy-Decadron prophylaxis was added with cycle 3 CAPOX-improved. 9. Ileostomy takedown 12/07/2012. 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 11. Port-A-Cath placement 12/31/204 12. Severe neutropenia secondary to chemotherapy following cycle 1 of FOLFIRI, chemotherapy was dose reduced and he received Neulasta with day 15 cycle 1  13. Nausea and vomiting following cycle 1 of FOLFIRI 14. Rectal stricture   Disposition: Jacob [name] appears stable. We will follow-up on the CEA from today. We are referring him for a chest CT next week to follow-up the lung nodules.   Port-A-Cath is being flushed today. We will schedule the next flush in 6 weeks and a follow-up visit and flush in 3 months.  Patient seen with Dr. Benay Spice.  Ned Card ANP/GNP-BC   04/25/2014  1:17 PM  This was a shared visit with Ned Card. We discussed restaging and treatment with Jacob [name].  I will present his case at the GI tumor conference after the restaging CT.  Julieanne Manson, MD

## 2014-04-25 NOTE — Patient Instructions (Signed)

## 2014-04-26 LAB — CEA: CEA: 0.8 ng/mL (ref 0.0–5.0)

## 2014-05-02 ENCOUNTER — Encounter (HOSPITAL_COMMUNITY): Payer: Self-pay

## 2014-05-02 ENCOUNTER — Ambulatory Visit (HOSPITAL_COMMUNITY)
Admission: RE | Admit: 2014-05-02 | Discharge: 2014-05-02 | Disposition: A | Discharge status: Home / Self Care | Payer: BC Managed Care – PPO | Source: Ambulatory Visit | Attending: Nurse Practitioner | Admitting: Nurse Practitioner

## 2014-05-02 DIAGNOSIS — Z923 Personal history of irradiation: Secondary | ICD-10-CM | POA: Diagnosis not present

## 2014-05-02 DIAGNOSIS — R918 Other nonspecific abnormal finding of lung field: Secondary | ICD-10-CM | POA: Insufficient documentation

## 2014-05-02 DIAGNOSIS — C787 Secondary malignant neoplasm of liver and intrahepatic bile duct: Secondary | ICD-10-CM | POA: Diagnosis not present

## 2014-05-02 DIAGNOSIS — Z08 Encounter for follow-up examination after completed treatment for malignant neoplasm: Secondary | ICD-10-CM | POA: Insufficient documentation

## 2014-05-02 DIAGNOSIS — C20 Malignant neoplasm of rectum: Secondary | ICD-10-CM | POA: Diagnosis not present

## 2014-05-02 DIAGNOSIS — Z9221 Personal history of antineoplastic chemotherapy: Secondary | ICD-10-CM | POA: Diagnosis not present

## 2014-05-02 MED ORDER — IOHEXOL 300 MG/ML  SOLN
100.0000 mL | Freq: Once | INTRAMUSCULAR | Status: AC | PRN
  Administered 2014-05-02: 80 mL via INTRAVENOUS

## 2014-05-06 ENCOUNTER — Telehealth: Payer: Self-pay | Admitting: *Deleted

## 2014-05-06 NOTE — Telephone Encounter (Signed)
-----   Message from Ladell Pier, MD sent at 05/03/2014  8:24 PM EST ----- Please call patient, CT shows stable lung nodules, and no new nodules F/u as scheduled We can schedule appt. With Dr. Servando Snare if he would like, but I don't think he will recommend surgery

## 2014-05-06 NOTE — Telephone Encounter (Signed)
Per Dr. Benay Spice; notified pt that CT shows stable lung nodules and no new lung nodules; we can schedule appt with Dr. Servando Snare if pt would like, but Dr. Benay Spice does not think he will recommend surgery.  Pt verbalized understanding of information and states "that's what we expected"  Also offered pt MD appt to review scan @ flush apt 06/06/14.  Pt declined MD appt and confirmed MD apt for 07/25/14.

## 2014-05-08 ENCOUNTER — Telehealth: Payer: Self-pay | Admitting: Gastroenterology

## 2014-05-08 NOTE — Telephone Encounter (Signed)
Patty, Can you contact him.  Recent discussion with Drs. Jerald Kief at GI cancer conference.  He needs repeat colonoscopy ([place]) for h/o rectal cancer  Thanks

## 2014-05-08 NOTE — Telephone Encounter (Signed)
Left message on machine to call back  

## 2014-05-09 NOTE — Telephone Encounter (Signed)
Pt has been scheduled for pre visit and colon

## 2014-05-09 NOTE — Telephone Encounter (Signed)
Left message on machine to call back  

## 2014-05-10 ENCOUNTER — Ambulatory Visit (AMBULATORY_SURGERY_CENTER): Payer: Self-pay

## 2014-05-10 VITALS — Ht 70.0 in | Wt 166.0 lb

## 2014-05-10 DIAGNOSIS — C20 Malignant neoplasm of rectum: Secondary | ICD-10-CM

## 2014-05-10 MED ORDER — MOVIPREP 100 G PO SOLR: 1.0000 | Freq: Once | ORAL | Status: DC

## 2014-05-10 NOTE — Progress Notes (Signed)
No allergies to eggs or soy nohome oxygen No diet/weight loss meds No past problems with anesthesia  Has email  Emmi instructions given for colonoscopy

## 2014-05-17 ENCOUNTER — Encounter: Payer: BC Managed Care – PPO | Admitting: Gastroenterology

## 2014-05-24 ENCOUNTER — Ambulatory Visit (AMBULATORY_SURGERY_CENTER): Payer: BC Managed Care – PPO | Admitting: Gastroenterology

## 2014-05-24 ENCOUNTER — Encounter: Payer: Self-pay | Admitting: Gastroenterology

## 2014-05-24 VITALS — BP 128/78 | HR 69 | Temp 97.4°F | Resp 19 | Ht 70.0 in | Wt 166.0 lb

## 2014-05-24 DIAGNOSIS — Z85040 Personal history of malignant carcinoid tumor of rectum: Secondary | ICD-10-CM

## 2014-05-24 DIAGNOSIS — C20 Malignant neoplasm of rectum: Secondary | ICD-10-CM

## 2014-05-24 MED ORDER — SODIUM CHLORIDE 0.9 % IV SOLN: 500.0000 mL | INTRAVENOUS | Status: DC

## 2014-05-24 NOTE — Op Note (Signed)
[place]  [address]. [place], [address]   COLONOSCOPY PROCEDURE REPORT  PATIENT: Jacob [name], Jacob [name]  MR#: [id number] BIRTHDATE: [birth year] , 73  yrs. old GENDER: male ENDOSCOPIST: Milus Banister, MD PROCEDURE DATE:  05/24/2014 PROCEDURE:   Colonoscopy, diagnostic First Screening Colonoscopy - Avg.  risk and is 50 yrs.  old or older - No.  Prior Negative Screening - Now for repeat screening. N/A  History of Adenoma - Now for follow-up colonoscopy & has been > or = to 3 yrs.  N/A  Polyps Removed Today? No.  Recommend repeat exam, <10 yrs? Yes.  High risk (family or personal hx). ASA CLASS:   Class II INDICATIONS:low lying rectal cancer, s/p LAR, eventual coloanal anastomosis, metastatic disease (liver, lung). MEDICATIONS: Monitored anesthesia care, Propofol 350 mg IV, and lidocaine 40mg  IV  DESCRIPTION OF PROCEDURE:   After the risks benefits and alternatives of the procedure were thoroughly explained, informed consent was obtained.  The digital rectal exam revealed colo-anal anastomosis, focally snug. This required manual dilation prior to insertion of pediatric colonoscope.   The LB [id number] endoscope was introduced through the anus and advanced to the cecum, which was identified by both the appendix and ileocecal valve. No adverse events experienced.   The quality of the prep was excellent.  The instrument was then slowly withdrawn as the colon was fully examined.    COLON FINDINGS: A normal appearing cecum, ileocecal valve, and appendiceal orifice were identified.  The ascending, transverse, descending, sigmoid colon were unremarkable.  There was a snug colo-anal anastomosis that require manual dilation prior to insertion of pediatric colonoscope.  Retroflexion was not performed due to a narrow rectal vault. The time to cecum=1 minutes 45 seconds.  Withdrawal time=10 minutes 50 seconds.  The scope was withdrawn and the procedure  completed. COMPLICATIONS: There were no immediate complications.  ENDOSCOPIC IMPRESSION: Normal colonoscopy except for coloanal anastomosis which was quite snug, required manual dilation prior to insertion of colonoscope.  RECOMMENDATIONS: Consider repeat colonoscopy in 5 years, pending clinical course My office will refer you to therapist to help with anal stenosis, dilations.  eSigned:  Milus Banister, MD 05/24/2014 9:15 AM   cc: Julieanne Manson, MD;

## 2014-05-24 NOTE — Progress Notes (Signed)
Stable to RR 

## 2014-05-24 NOTE — Progress Notes (Signed)
Called to room to assist during endoscopic procedure.  Patient ID and intended procedure confirmed with present staff. Received instructions for my participation in the procedure from the performing physician.  

## 2014-05-24 NOTE — Patient Instructions (Signed)
YOU HAD AN ENDOSCOPIC PROCEDURE TODAY AT THE [facility]:   Refer to the procedure report that was given to you for any specific questions about what was found during the examination.  If the procedure report does not answer your questions, please call your gastroenterologist to clarify.  If you requested that your care partner not be given the details of your procedure findings, then the procedure report has been included in a sealed envelope for you to review at your convenience later.  YOU SHOULD EXPECT: Some feelings of bloating in the abdomen. Passage of more gas than usual.  Walking can help get rid of the air that was put into your GI tract during the procedure and reduce the bloating. If you had a lower endoscopy (such as a colonoscopy or flexible sigmoidoscopy) you may notice spotting of blood in your stool or on the toilet paper. If you underwent a bowel prep for your procedure, you may not have a normal bowel movement for a few days.  Please Note:  You might notice some irritation and congestion in your nose or some drainage.  This is from the oxygen used during your procedure.  There is no need for concern and it should clear up in a day or so.  SYMPTOMS TO REPORT IMMEDIATELY:   Following lower endoscopy (colonoscopy or flexible sigmoidoscopy):  Excessive amounts of blood in the stool  Significant tenderness or worsening of abdominal pains  Swelling of the abdomen that is new, acute  Fever of 100F or higher   For urgent or emergent issues, a gastroenterologist can be reached at any hour by calling [phone].   DIET: Your first meal following the procedure should be a small meal and then it is ok to progress to your normal diet. Heavy or fried foods are harder to digest and may make you feel nauseous or bloated.  Likewise, meals heavy in dairy and vegetables can increase bloating.  Drink plenty of fluids but you should avoid alcoholic beverages for 24  hours.  ACTIVITY:  You should plan to take it easy for the rest of today and you should NOT DRIVE or use heavy machinery until tomorrow (because of the sedation medicines used during the test).    FOLLOW UP: Our staff will call the number listed on your records the next business day following your procedure to check on you and address any questions or concerns that you may have regarding the information given to you following your procedure. If we do not reach you, we will leave a message.  However, if you are feeling well and you are not experiencing any problems, there is no need to return our call.  We will assume that you have returned to your regular daily activities without incident.  If any biopsies were taken you will be contacted by phone or by letter within the next 1-3 weeks.  Please call us at [phone] if you have not heard about the biopsies in 3 weeks.    SIGNATURES/CONFIDENTIALITY: You and/or your care partner have signed paperwork which will be entered into your electronic medical record.  These signatures attest to the fact that that the information above on your After Visit Summary has been reviewed and is understood.  Full responsibility of the confidentiality of this discharge information lies with you and/or your care-partner. 

## 2014-05-27 ENCOUNTER — Telehealth: Payer: Self-pay

## 2014-05-27 ENCOUNTER — Telehealth: Payer: Self-pay | Admitting: *Deleted

## 2014-05-27 DIAGNOSIS — K624 Stenosis of anus and rectum: Secondary | ICD-10-CM

## 2014-05-27 NOTE — Telephone Encounter (Signed)
-----   Message from Milus Banister, MD sent at 05/24/2014  9:16 AM EST ----- He needs referral to the therapist that helps with pelvic floor, anal stenosis issues.  I forget her name.  Thanks

## 2014-05-27 NOTE — Telephone Encounter (Signed)
Left message on machine to call back  

## 2014-05-27 NOTE — Telephone Encounter (Signed)
[place]  appt for 06/05/14 230 pm

## 2014-05-27 NOTE — Telephone Encounter (Signed)
  Follow up Call-  Call back number 05/24/2014 03/06/2012  Post procedure Call Back phone  # [phone]  Permission to leave phone message Yes Yes     Patient questions:  Message left to call us if necessary.

## 2014-05-28 NOTE — Telephone Encounter (Signed)
Pt.notified

## 2014-06-05 ENCOUNTER — Ambulatory Visit: Payer: BC Managed Care – PPO | Admitting: Physical Therapy

## 2014-06-06 ENCOUNTER — Ambulatory Visit (HOSPITAL_BASED_OUTPATIENT_CLINIC_OR_DEPARTMENT_OTHER): Payer: BC Managed Care – PPO

## 2014-06-06 VITALS — BP 141/87 | HR 83 | Temp 98.0°F | Resp 20

## 2014-06-06 DIAGNOSIS — C20 Malignant neoplasm of rectum: Secondary | ICD-10-CM

## 2014-06-06 DIAGNOSIS — Z95828 Presence of other vascular implants and grafts: Secondary | ICD-10-CM

## 2014-06-06 DIAGNOSIS — C787 Secondary malignant neoplasm of liver and intrahepatic bile duct: Secondary | ICD-10-CM

## 2014-06-06 DIAGNOSIS — Z452 Encounter for adjustment and management of vascular access device: Secondary | ICD-10-CM

## 2014-06-06 MED ORDER — HEPARIN SOD (PORK) LOCK FLUSH 100 UNIT/ML IV SOLN
500.0000 [IU] | Freq: Once | INTRAVENOUS | Status: AC
  Administered 2014-06-06: 500 [IU] via INTRAVENOUS
  Filled 2014-06-06: qty 5

## 2014-06-06 MED ORDER — SODIUM CHLORIDE 0.9 % IJ SOLN
10.0000 mL | INTRAMUSCULAR | Status: DC | PRN
  Administered 2014-06-06: 10 mL via INTRAVENOUS
  Filled 2014-06-06: qty 10

## 2014-06-06 NOTE — Patient Instructions (Signed)

## 2014-06-25 ENCOUNTER — Encounter: Payer: Self-pay | Admitting: Physical Therapy

## 2014-06-25 ENCOUNTER — Ambulatory Visit: Payer: BC Managed Care – PPO | Attending: Gastroenterology | Admitting: Physical Therapy

## 2014-06-25 DIAGNOSIS — M6289 Other specified disorders of muscle: Secondary | ICD-10-CM

## 2014-06-25 DIAGNOSIS — G729 Myopathy, unspecified: Secondary | ICD-10-CM | POA: Insufficient documentation

## 2014-06-25 DIAGNOSIS — M25559 Pain in unspecified hip: Secondary | ICD-10-CM | POA: Diagnosis not present

## 2014-06-25 NOTE — Therapy (Addendum)
[facility] [address], [place], [place], [address] Phone: [phone]   Fax:  [phone]  Physical Therapy Evaluation  Patient Details  Name: Jacob [name] MRN: [id number] Date of Birth: [birth year] Referring Provider:  Milus Banister, MD  Encounter Date: 06/25/2014      PT End of Session - 06/25/14 1650    Visit Number 1   Date for PT Re-Evaluation 09/17/14   PT Start Time 6962   PT Stop Time 1530   PT Time Calculation (min) 45 min   Activity Tolerance Patient tolerated treatment well   Behavior During Therapy [facility] for tasks assessed/performed      Past Medical History  Diagnosis Date  . Chicken pox as a child    ?  . Hernia 6 months old  . Allergic state 01/19/2012  . Reflux 01/19/2012  . Elevated liver function tests 01/19/2012  . Overweight(278.02) 01/19/2012  . Preventative health care 01/19/2012  . Sun-damaged skin 01/19/2012  . Migraine headache as a child  . Hx of migraines   . Radiation 03/20/12-04/27/12    Pelvis 50.4 gray Rectal cancer  . Anxiety   . Fatty liver   . Numbness     TOES OF BOTH FEET.  . Pain     LEFT HEEL PAIN -SEVERE ESPECIALLY AFTER SITTING OR LYING DOWN - DIFFICULT TO GET OUT OF BED AND WALK IN THE MORNINGS BECAUSE OF HEEL PAIN  . Anemia 12/31/2012  . Dysrhythmia as child    HX OF IRREGULAR HB AT TIME OF TONSILLECTOMY - SURGERY WAS NOT DONE-CAN'T REMEMBER ANY OTHER DETAILS- NEVER HAD THE SURGERY.  . 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 Lung abnormality 2015  . History of diarrhea     better since chemo finished  . Lung nodule 08/2013  . Lung nodule, multiple 11/29/2013  . 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 Cancer 03/06/12    rectal bx=Adenocarcinoma  PT HAD RADIATION , CHEMO SURGERY    Past Surgical History  Procedure Laterality Date  . Rectal biopsy  03/06/12    distal mass1-2cm from anal  verge=Adenocarcinoma  . Eus  03/14/2012    Procedure: LOWER ENDOSCOPIC ULTRASOUND (EUS);  Surgeon: Milus Banister, MD;  Location: [facility];  Service: Endoscopy;  Laterality: N/A;  . Hernia repair  53 months old    right inguinal repair  . Cyst excision      L EAR AREA  . Laparoscopic low anterior resection N/A 06/29/2012    Procedure: LAPAROSCOPIC LOW ANTERIOR RESECTION, mobilization splenic flexure,coloanal anastomosis,diverting ileostomy;  Surgeon: Leighton Ruff, MD;  Location: [facility];  Service: General;  Laterality: N/A;  . Ileostomy closure N/A 12/07/2012    Procedure: ILEOSTOMY TAKEDOWN;  Surgeon: Leighton Ruff, MD;  Location: [facility];  Service: General;  Laterality: N/A;  . Portacath placement Left 03/21/2013    Procedure: INSERTION PORT-A-CATH;  Surgeon: Leighton Ruff, MD;  Location: [facility];  Service: General;  Laterality: Left;  . Open hepatectomy [83] N/A 09/17/2013    Procedure: OPEN HEPATECTOMY;  Surgeon: Stark Klein, MD;  Location: [facility];  Service: General;  Laterality: N/A;  . Laparoscopy N/A 09/17/2013    Procedure: LAPAROSCOPY DIAGNOSTIC;  Surgeon: Stark Klein, MD;  Location: [facility];  Service: General;  Laterality: N/A;  . Liver ultrasound N/A 09/17/2013    Procedure: LIVER ULTRASOUND;  Surgeon: Stark Klein, MD;  Location: [facility];  Service:  General;  Laterality: N/A;  . Cholecystectomy N/A 09/17/2013    Procedure: CHOLECYSTECTOMY;  Surgeon: Stark Klein, MD;  Location: [place];  Service: General;  Laterality: N/A;    There were no vitals filed for this visit.  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  Patient reports he has some of his colon some out of the anus after the bowel  movement.  Patient reports some days he will  0-1 per day and other days could be 10 or more bowel movements.  Patient reports urgency  but  will not have a bowel movement .  When patient stands up he will then have a bowel movementt.  Some days patient may have a bowel movement without urge. Patient reports a blge where he had the liver surgery in the morning and when he coughs.    Pertinent History Patient has surgery to remove 60% of his liver due to metatstatic cancer on 09/19/2014. No chemotherapy or radiation from the cancer.    Limitations --  difficulty with bowel movement   Patient Stated Goals improve bowel health   Currently in Pain? Yes   Pain Score 4    Pain Location Rectum   Pain Orientation Mid   Pain Descriptors / Indicators Aching   Pain Type Acute pain   Aggravating Factors  bowel movement   Pain Relieving Factors no bowel movement   Multiple Pain Sites No            [facility] PT Assessment - 06/25/14 0001    Assessment   Medical Diagnosis anal stenosis   Onset Date 03/17/12   Next MD Visit 1 month   Prior Therapy yes prior to liver cancer   Precautions   Precautions Other (comment)   Precaution Comments No ultrasound   Balance Screen   Has the patient fallen in the past 6 months No   Has the patient had a decrease in activity level because of a fear of falling?  No   Is the patient reluctant to leave their home because of a fear of falling?  No   Prior Function   Level of Independence Independent with basic ADLs   Vocation Full time employment   Vocation Requirements lifting   Palpation   Palpation Palpable tenderness located in right diaphragm, along the scar from liver cancer surgery. Palpable indent where the scar is for the liver surgery.                  Pelvic Floor Special Questions - 06/25/14 0001    Prior Pelvic/Prostate Exam Yes   Pelvic Floor Internal Exam Patient confirms identification and approve physical therapist to palpate the anal  canal.    Exam Type Rectal   Palpation thin band that is a circle, 1 cm diameter, Therapist unable to place her index finger throught the anal canal   Strength fair squeeze, definite lift   Strength # of reps 5   Strength # of seconds 5   Tone good                  PT Education - 06/25/14 1649    Education provided Yes  Education Details where to purchase an anal dilator and what size.    Person(s) Educated Patient   Methods Explanation;Handout   Comprehension Verbalized understanding          PT Short Term Goals - 06/25/14 1654    PT SHORT TERM GOAL #1   Title understand how to use anal dilator to expand the anal canal   Time 4   Period Weeks   Status New   PT SHORT TERM GOAL #2   Title understand flexibility exercises to stretch the pelvic floor muscles    Time 4   Period Weeks   Status New   PT SHORT TERM GOAL #3   Title understand ways to relax the pelvic floor while using the anal dilator and having a bowel movement   Time 4   Period Weeks   Status New           PT Long Term Goals - 06/25/14 1655    PT LONG TERM GOAL #1   Title ability to relax while having a bowel movement due to pain decreased >/= 60%   PT LONG TERM GOAL #2   Title ability to use the large anal dilator to expand the anus to have larger bowel movements.    Time 12   Period Weeks   Status New   PT LONG TERM GOAL #3   Title ability to  have </= 5 bowel movements due to improved bowel health   Time 12   Period Weeks   Status New   PT LONG TERM GOAL #4   Title pelvic floor strength >/= 4/5   Time 12   Period Weeks   Status New               Plan - 06/25/14 1651    Clinical Impression Statement Patient presents with anal stenosis that is less than 1 cm in diameter.  Patient has difficulty with have too many bowel movements in one day. Patient reports his bowel movements are skinny. Decreased mobility of  surgical scars.  Patient reports possibility of a hernia in the  right upper quadrant.    Pt will benefit from skilled therapeutic intervention in order to improve on the following deficits Decreased coordination;Impaired flexibility;Increased fascial restricitons;Decreased scar mobility;Increased muscle spasms;Pain;Decreased mobility;Decreased strength   Rehab Potential Good   PT Frequency 1x / week   PT Duration 12 weeks   PT Treatment/Interventions Biofeedback;Therapeutic activities;Patient/family education;Scar mobilization;Therapeutic exercise;Manual techniques;Neuromuscular re-education;Functional mobility training;Electrical Stimulation   PT Next Visit Plan instruction on how to use anal dilator.     PT Home Exercise Plan Instruction on how to use anal dilator   Recommended Other Services None   Consulted and Agree with Plan of Care Patient         Problem List Patient Active Problem List   Diagnosis Date Noted  . Lung nodule, multiple 11/29/2013  . Metastatic adenocarcinoma to liver 09/17/2013  . Rectal cancer metastasized to liver 09/17/2013  . Rectal cancer, metastatic to liver 08/28/2013  . 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 Anemia 12/31/2012  . Grief reaction 06/18/2012  . Malignant neoplasm of rectum 03/17/2012  . GERD (gastroesophageal reflux disease)   . Elevated liver function tests 01/19/2012  . Overweight 01/19/2012  . Elevated BP 01/19/2012  . Sun-damaged skin 01/19/2012    GRAY,CHERYL,PT 06/25/2014, 5:01 PM  [facility] [address] [place], [place], [address] Phone: [phone]  Fax:  [phone]    PHYSICAL THERAPY DISCHARGE SUMMARY  Visits from Start of Care: 1  Current functional level related to goals / functional outcomes: See above.  Patient did not return.  Patient only attended the initial evaluation.    Remaining deficits: See above.   Education / Equipment: HEP Plan:                                                     Patient goals were not met. Patient is being discharged due to not returning since the last visit.  Thank you for the referral. Earlie Counts, PT 02/19/2015 3:01 PM  ?????

## 2014-06-25 NOTE — Patient Instructions (Signed)
Physical therapist instructed patient on where to purchase an anal dilator, what size would be appropriate, how to make one. Patient verbally understands and will be having one prior to his next appointment.

## 2014-07-17 ENCOUNTER — Ambulatory Visit: Payer: BC Managed Care – PPO | Admitting: Physical Therapy

## 2014-07-24 ENCOUNTER — Other Ambulatory Visit: Payer: Self-pay | Admitting: *Deleted

## 2014-07-24 DIAGNOSIS — C787 Secondary malignant neoplasm of liver and intrahepatic bile duct: Secondary | ICD-10-CM

## 2014-07-24 DIAGNOSIS — R7989 Other specified abnormal findings of blood chemistry: Secondary | ICD-10-CM

## 2014-07-24 DIAGNOSIS — R945 Abnormal results of liver function studies: Secondary | ICD-10-CM

## 2014-07-24 DIAGNOSIS — C20 Malignant neoplasm of rectum: Secondary | ICD-10-CM

## 2014-07-25 ENCOUNTER — Other Ambulatory Visit: Payer: Self-pay | Admitting: *Deleted

## 2014-07-25 ENCOUNTER — Ambulatory Visit (HOSPITAL_BASED_OUTPATIENT_CLINIC_OR_DEPARTMENT_OTHER): Payer: BC Managed Care – PPO | Admitting: Oncology

## 2014-07-25 ENCOUNTER — Ambulatory Visit (HOSPITAL_BASED_OUTPATIENT_CLINIC_OR_DEPARTMENT_OTHER): Payer: BC Managed Care – PPO

## 2014-07-25 ENCOUNTER — Telehealth: Payer: Self-pay | Admitting: Oncology

## 2014-07-25 ENCOUNTER — Other Ambulatory Visit (HOSPITAL_BASED_OUTPATIENT_CLINIC_OR_DEPARTMENT_OTHER): Payer: BC Managed Care – PPO

## 2014-07-25 VITALS — BP 140/83 | HR 64 | Temp 98.0°F | Resp 19 | Ht 70.0 in | Wt 162.6 lb

## 2014-07-25 DIAGNOSIS — C20 Malignant neoplasm of rectum: Secondary | ICD-10-CM | POA: Diagnosis not present

## 2014-07-25 DIAGNOSIS — C787 Secondary malignant neoplasm of liver and intrahepatic bile duct: Secondary | ICD-10-CM

## 2014-07-25 DIAGNOSIS — Z95828 Presence of other vascular implants and grafts: Secondary | ICD-10-CM

## 2014-07-25 DIAGNOSIS — R7989 Other specified abnormal findings of blood chemistry: Secondary | ICD-10-CM

## 2014-07-25 DIAGNOSIS — R945 Abnormal results of liver function studies: Secondary | ICD-10-CM

## 2014-07-25 LAB — CBC WITH DIFFERENTIAL/PLATELET
BASO%: 0 % (ref 0.0–2.0)
Basophils Absolute: 0 10*3/uL (ref 0.0–0.1)
EOS%: 2 % (ref 0.0–7.0)
Eosinophils Absolute: 0.1 10*3/uL (ref 0.0–0.5)
HEMATOCRIT: 38.9 % (ref 38.4–49.9)
HGB: 13 g/dL (ref 13.0–17.1)
LYMPH#: 0.5 10*3/uL — AB (ref 0.9–3.3)
LYMPH%: 20.8 % (ref 14.0–49.0)
MCH: 29.6 pg (ref 27.2–33.4)
MCHC: 33.4 g/dL (ref 32.0–36.0)
MCV: 88.6 fL (ref 79.3–98.0)
MONO#: 0.3 10*3/uL (ref 0.1–0.9)
MONO%: 12.5 % (ref 0.0–14.0)
NEUT%: 64.7 % (ref 39.0–75.0)
NEUTROS ABS: 1.7 10*3/uL (ref 1.5–6.5)
Platelets: 67 10*3/uL — ABNORMAL LOW (ref 140–400)
RBC: 4.39 10*6/uL (ref 4.20–5.82)
RDW: 14.9 % — AB (ref 11.0–14.6)
WBC: 2.6 10*3/uL — AB (ref 4.0–10.3)

## 2014-07-25 MED ORDER — SODIUM CHLORIDE 0.9 % IJ SOLN
10.0000 mL | INTRAMUSCULAR | Status: DC | PRN
  Administered 2014-07-25: 10 mL via INTRAVENOUS
  Filled 2014-07-25: qty 10

## 2014-07-25 MED ORDER — HEPARIN SOD (PORK) LOCK FLUSH 100 UNIT/ML IV SOLN
500.0000 [IU] | Freq: Once | INTRAVENOUS | Status: AC
  Administered 2014-07-25: 500 [IU] via INTRAVENOUS
  Filled 2014-07-25: qty 5

## 2014-07-25 NOTE — Telephone Encounter (Signed)
Gave and printed appt sched for pt for June and Aug

## 2014-07-25 NOTE — Patient Instructions (Signed)

## 2014-07-25 NOTE — Progress Notes (Signed)
[facility] OFFICE PROGRESS NOTE   Diagnosis: Rectal cancer  INTERVAL HISTORY:   Mr. Ghan returns as scheduled. He feels well. He reports improvement in the rectal urgency. He has developed a "hernia" near the liver surgery scar. Good appetite. He is working. The neuropathy symptoms have resolved.  Objective:  Vital signs in last 24 hours:  Blood pressure 140/83, pulse 64, temperature 98 F (36.7 C), temperature source Oral, resp. rate 19, height '5\' 10"'$  (1.778 m), weight 162 lb 9.6 oz (73.755 kg), SpO2 100 %.    HEENT: Neck without mass Lymphatics: "Shotty "axillary and a left inguinal node. No cervical or supraclavicular nodes Resp: Lungs clear bilaterally Cardio: Regular rate and rhythm GI: No hepatosplenomegaly, nontender, no mass, no apparent hernia   Portacath/PICC-without erythema  Lab Results:  Lab Results  Component Value Date   WBC 2.6* 07/25/2014   HGB 13.0 07/25/2014   HCT 38.9 07/25/2014   MCV 88.6 07/25/2014   PLT 67* 07/25/2014   NEUTROABS 1.7 07/25/2014     Lab Results  Component Value Date   CEA 0.8 04/25/2014    Medications: I have reviewed the patient's current medications.  Assessment/Plan: 1. Rectal cancer. Partially obstructing mass noted 1-2 cm from the anal verge on a colonoscopy 03/06/2012. Endoscopic ultrasound 03/14/2012 with a 7.5 mm thick, 3.2 cm wide hypoechoic, irregularly bordered mass that clearly passed into and through the muscularis propria layer of the distal rectal wall (uT3); 3 small (largest 7 mm) perirectal lymph nodes. The lymph nodes were all round, discrete, hypoechoic, homogenous; suspicious for malignant involvement (uN1).  He began radiation and concurrent Xeloda chemotherapy on 03/20/2012, completed 04/27/2012.   Low anterior resection/coloanal anastomosis and diverting ileostomy 06/29/2012 with the final pathology revealing a T2N0 tumor with extensive fibrosis and negative margins.   Cycle 1 of  adjuvant CAPOX 07/19/2012. Cycle 5 of adjuvant CAPOX 10/11/2012.   CEA 2.5 on 11/14/2012.   CEA 14.6 03/08/2013.   Restaging CT evaluation 03/08/2013 with a 4 mm pulmonary nodule left lung base not identified on comparison exam; new liver lesions including a 29 x 26 mm irregular peripheral enhancing rounded lesion in the dome of the right hepatic lobe, a less well-defined new subcapsular lesion in the lateral right hepatic lobe measuring 12 mm, a new subcapsular lesion in the anterior right hepatic lobe adjacent to the gallbladder fossa measuring 10 mm; a rounded low-density lesion in the inferior right hepatic lobe measuring 10 mm compared to 7 mm on the prior study (radiologist commented this may represent an enlarged cyst).   MRI of the abdomen 04/12/2013 confirmed multiple T2 hyperintense metastatic lesions throughout the right liver   Initiation of FOLFIRI/Avastin with genotype based irinotecan dosing per the [facility] study 04/05/2013.   Restaging CT evaluation on 06/20/2013 (after 2 cycles/4 treatments) showed improvement in the liver metastases and stable size of a left lower lobe pulmonary nodule now with central cavitation.   Continuation of FOLFIRI/Avastin.   Restaging CT evaluation 08/14/2013-decrease in the size of liver metastases, slight decrease in the size of a cavitary left lower lobe nodule, no evidence of disease progression.   Status post right hepatic lobectomy 09/17/2013. Pathology showed multiple foci of metastatic adenocarcinoma (5 nodules of metastatic adenocarcinoma 4 of which are subcapsular with the nodules ranging in size from 0.6-1.8 cm in greatest dimension). Margins not involved. Biopsy of a portal lymph node showed benign adipose tissue; no lymph node tissue or malignancy.   Normal CEA 11/06/2013   CT  11/06/2013 with a new pleural-based right lower chest lesion, slight in enlargement of the a left-sided lung nodule  CT 01/29/2014 with a decrease in the  right lower chest pleural-based lesion and a slight increase of a left-sided lung lesion, other lung lesions were stable  CT chest 05/02/2014 with a decrease in the size of a right lower lobe pulmonary nodule months similar size of a dominant left lower lobe nodule, minimal enlargement of a smaller left lower lobe nodule, no new site of disease 2. Irregular bowel habits/rectal bleeding secondary to #1. 3. History of Mild elevation of the liver enzymes. Question secondary to hepatic steatosis. 4. Indeterminate 8 mm posterior right liver lesion on the staging CT 03/06/2012. 5. Mildly elevated CEA at 5.7 on 03/06/2012. 6. History of radiation erythema at the groin and perineum 7. 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 No clinical evidence of thrombophlebitis or extravasation. 8. Delayed nausea following chemotherapy-Decadron prophylaxis was added with cycle 3 CAPOX-improved. 9. Ileostomy takedown 12/07/2012. 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 11. Port-A-Cath placement 12/31/204 12. Severe neutropenia secondary to chemotherapy following cycle 1 of FOLFIRI, chemotherapy was dose reduced and he received Neulasta with day 15 cycle 1  13. Nausea and vomiting following cycle 1 of FOLFIRI 14. Rectal stricture 15. Thrombocytopenia-persistent, potentially a sequelae of chemotherapy or hepatic toxicity from chemotherapy/radiation   Disposition:  Mr. Meikle appears well. No clinical evidence for progression of the metastatic rectal cancer. He will contact us for spontaneous bleeding or bruising. I will review the peripheral blood smear. We will check a CEA when he returns for Port-A-Cath flush in 6 weeks.  He will be scheduled for an office visit and restaging CT in 3 months.  Betsy Coder, MD  07/25/2014  12:43 PM

## 2014-09-05 ENCOUNTER — Telehealth: Payer: Self-pay | Admitting: Oncology

## 2014-09-05 ENCOUNTER — Other Ambulatory Visit: Payer: BC Managed Care – PPO

## 2014-09-05 ENCOUNTER — Telehealth: Payer: Self-pay | Admitting: *Deleted

## 2014-09-05 ENCOUNTER — Ambulatory Visit (HOSPITAL_BASED_OUTPATIENT_CLINIC_OR_DEPARTMENT_OTHER): Payer: BC Managed Care – PPO

## 2014-09-05 VITALS — BP 124/72 | HR 64 | Temp 98.3°F | Resp 16

## 2014-09-05 DIAGNOSIS — Z452 Encounter for adjustment and management of vascular access device: Secondary | ICD-10-CM | POA: Diagnosis not present

## 2014-09-05 DIAGNOSIS — C787 Secondary malignant neoplasm of liver and intrahepatic bile duct: Secondary | ICD-10-CM

## 2014-09-05 DIAGNOSIS — C20 Malignant neoplasm of rectum: Secondary | ICD-10-CM

## 2014-09-05 DIAGNOSIS — Z95828 Presence of other vascular implants and grafts: Secondary | ICD-10-CM

## 2014-09-05 LAB — CEA: CEA: 0.8 ng/mL (ref 0.0–5.0)

## 2014-09-05 MED ORDER — HEPARIN SOD (PORK) LOCK FLUSH 100 UNIT/ML IV SOLN
500.0000 [IU] | Freq: Once | INTRAVENOUS | Status: AC
  Administered 2014-09-05: 500 [IU] via INTRAVENOUS
  Filled 2014-09-05: qty 5

## 2014-09-05 MED ORDER — SODIUM CHLORIDE 0.9 % IJ SOLN
10.0000 mL | INTRAMUSCULAR | Status: DC | PRN
  Administered 2014-09-05: 10 mL via INTRAVENOUS
  Filled 2014-09-05: qty 10

## 2014-09-05 NOTE — Patient Instructions (Signed)

## 2014-09-05 NOTE — Telephone Encounter (Signed)
Left message on voicemail for pt to call office. May need to change appointment time for flush on 8/4. Pt is scheduled for CT prior to flush. Ordered chemistry to be added to lab today.

## 2014-09-05 NOTE — Telephone Encounter (Signed)
pt came by stating recvd a call from sch-adv it was from [place] to sch CT-gave pt # to call

## 2014-09-06 LAB — COMPREHENSIVE METABOLIC PANEL
ALK PHOS: 54 U/L (ref 39–117)
ALT: 17 U/L (ref 0–53)
AST: 18 U/L (ref 0–37)
Albumin: 3.7 g/dL (ref 3.5–5.2)
BUN: 13 mg/dL (ref 6–23)
CALCIUM: 8.9 mg/dL (ref 8.4–10.5)
CHLORIDE: 109 meq/L (ref 96–112)
CO2: 24 mEq/L (ref 19–32)
CREATININE: 0.83 mg/dL (ref 0.50–1.35)
Glucose, Bld: 89 mg/dL (ref 70–99)
Potassium: 4 mEq/L (ref 3.5–5.3)
Sodium: 143 mEq/L (ref 135–145)
Total Bilirubin: 0.3 mg/dL (ref 0.2–1.2)
Total Protein: 6 g/dL (ref 6.0–8.3)

## 2014-09-06 NOTE — Telephone Encounter (Signed)
Pt returned Tanya's call. Stated he is OK for CT to use a peripheral IV for the scan. No chemistry results seen from 6/16, just CEA.

## 2014-09-30 ENCOUNTER — Telehealth: Payer: Self-pay | Admitting: Oncology

## 2014-09-30 NOTE — Telephone Encounter (Signed)
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 Mailed out schedule to pt... KJ

## 2014-10-21 ENCOUNTER — Other Ambulatory Visit: Payer: BC Managed Care – PPO

## 2014-10-21 ENCOUNTER — Ambulatory Visit: Payer: BC Managed Care – PPO | Admitting: Oncology

## 2014-10-21 ENCOUNTER — Ambulatory Visit (HOSPITAL_COMMUNITY): Payer: BC Managed Care – PPO

## 2014-10-24 ENCOUNTER — Other Ambulatory Visit: Payer: BC Managed Care – PPO

## 2014-10-24 ENCOUNTER — Ambulatory Visit: Payer: BC Managed Care – PPO | Admitting: Oncology

## 2014-11-07 ENCOUNTER — Encounter (HOSPITAL_COMMUNITY): Payer: Self-pay

## 2014-11-07 ENCOUNTER — Ambulatory Visit (HOSPITAL_BASED_OUTPATIENT_CLINIC_OR_DEPARTMENT_OTHER): Payer: BC Managed Care – PPO | Admitting: Oncology

## 2014-11-07 ENCOUNTER — Encounter: Payer: Self-pay | Admitting: *Deleted

## 2014-11-07 ENCOUNTER — Ambulatory Visit (HOSPITAL_COMMUNITY)
Admission: RE | Admit: 2014-11-07 | Discharge: 2014-11-07 | Disposition: A | Discharge status: Home / Self Care | Payer: BC Managed Care – PPO | Source: Ambulatory Visit | Attending: Oncology | Admitting: Oncology

## 2014-11-07 ENCOUNTER — Ambulatory Visit (HOSPITAL_BASED_OUTPATIENT_CLINIC_OR_DEPARTMENT_OTHER): Payer: BC Managed Care – PPO

## 2014-11-07 ENCOUNTER — Telehealth: Payer: Self-pay | Admitting: Oncology

## 2014-11-07 ENCOUNTER — Other Ambulatory Visit (HOSPITAL_BASED_OUTPATIENT_CLINIC_OR_DEPARTMENT_OTHER): Payer: BC Managed Care – PPO

## 2014-11-07 VITALS — BP 149/78 | HR 67 | Temp 98.2°F | Resp 18 | Ht 70.0 in | Wt 161.9 lb

## 2014-11-07 DIAGNOSIS — Z923 Personal history of irradiation: Secondary | ICD-10-CM | POA: Diagnosis not present

## 2014-11-07 DIAGNOSIS — Z9221 Personal history of antineoplastic chemotherapy: Secondary | ICD-10-CM | POA: Insufficient documentation

## 2014-11-07 DIAGNOSIS — Z452 Encounter for adjustment and management of vascular access device: Secondary | ICD-10-CM | POA: Diagnosis not present

## 2014-11-07 DIAGNOSIS — C20 Malignant neoplasm of rectum: Secondary | ICD-10-CM | POA: Insufficient documentation

## 2014-11-07 DIAGNOSIS — Z95828 Presence of other vascular implants and grafts: Secondary | ICD-10-CM

## 2014-11-07 DIAGNOSIS — Z006 Encounter for examination for normal comparison and control in clinical research program: Secondary | ICD-10-CM

## 2014-11-07 DIAGNOSIS — R918 Other nonspecific abnormal finding of lung field: Secondary | ICD-10-CM | POA: Insufficient documentation

## 2014-11-07 DIAGNOSIS — C7800 Secondary malignant neoplasm of unspecified lung: Secondary | ICD-10-CM | POA: Diagnosis not present

## 2014-11-07 DIAGNOSIS — C787 Secondary malignant neoplasm of liver and intrahepatic bile duct: Secondary | ICD-10-CM | POA: Diagnosis not present

## 2014-11-07 LAB — COMPREHENSIVE METABOLIC PANEL (CC13)
ALT: 19 U/L (ref 0–55)
AST: 16 U/L (ref 5–34)
Albumin: 3.8 g/dL (ref 3.5–5.0)
Alkaline Phosphatase: 52 U/L (ref 40–150)
Anion Gap: 5 mEq/L (ref 3–11)
BUN: 16 mg/dL (ref 7.0–26.0)
CALCIUM: 8.9 mg/dL (ref 8.4–10.4)
CO2: 26 mEq/L (ref 22–29)
Chloride: 110 mEq/L — ABNORMAL HIGH (ref 98–109)
Creatinine: 0.8 mg/dL (ref 0.7–1.3)
Glucose: 91 mg/dl (ref 70–140)
POTASSIUM: 3.8 meq/L (ref 3.5–5.1)
Sodium: 141 mEq/L (ref 136–145)
Total Bilirubin: 0.52 mg/dL (ref 0.20–1.20)
Total Protein: 6.1 g/dL — ABNORMAL LOW (ref 6.4–8.3)

## 2014-11-07 LAB — CBC WITH DIFFERENTIAL/PLATELET
BASO%: 0 % (ref 0.0–2.0)
Basophils Absolute: 0 10*3/uL (ref 0.0–0.1)
EOS%: 2.1 % (ref 0.0–7.0)
Eosinophils Absolute: 0 10*3/uL (ref 0.0–0.5)
HEMATOCRIT: 38.6 % (ref 38.4–49.9)
HGB: 12.8 g/dL — ABNORMAL LOW (ref 13.0–17.1)
LYMPH#: 0.3 10*3/uL — AB (ref 0.9–3.3)
LYMPH%: 14.9 % (ref 14.0–49.0)
MCH: 29.5 pg (ref 27.2–33.4)
MCHC: 33.2 g/dL (ref 32.0–36.0)
MCV: 88.9 fL (ref 79.3–98.0)
MONO#: 0.5 10*3/uL (ref 0.1–0.9)
MONO%: 25.5 % — ABNORMAL HIGH (ref 0.0–14.0)
NEUT#: 1.1 10*3/uL — ABNORMAL LOW (ref 1.5–6.5)
NEUT%: 57.5 % (ref 39.0–75.0)
PLATELETS: 70 10*3/uL — AB (ref 140–400)
RBC: 4.34 10*6/uL (ref 4.20–5.82)
RDW: 14.3 % (ref 11.0–14.6)
WBC: 1.9 10*3/uL — ABNORMAL LOW (ref 4.0–10.3)

## 2014-11-07 LAB — CHCC SMEAR

## 2014-11-07 LAB — CEA: CEA: 0.7 ng/mL (ref 0.0–5.0)

## 2014-11-07 MED ORDER — SODIUM CHLORIDE 0.9 % IJ SOLN
10.0000 mL | INTRAMUSCULAR | Status: DC | PRN
  Administered 2014-11-07: 10 mL via INTRAVENOUS
  Filled 2014-11-07: qty 10

## 2014-11-07 MED ORDER — IOHEXOL 300 MG/ML  SOLN
75.0000 mL | Freq: Once | INTRAMUSCULAR | Status: AC | PRN
  Administered 2014-11-07: 75 mL via INTRAVENOUS

## 2014-11-07 MED ORDER — HEPARIN SOD (PORK) LOCK FLUSH 100 UNIT/ML IV SOLN
500.0000 [IU] | Freq: Once | INTRAVENOUS | Status: AC
  Administered 2014-11-07: 500 [IU] via INTRAVENOUS
  Filled 2014-11-07: qty 5

## 2014-11-07 NOTE — Telephone Encounter (Signed)
Gave and printed appt sched and avs for pt for Sept °

## 2014-11-07 NOTE — Progress Notes (Signed)
Oncology Nurse Navigator Documentation  Oncology Nurse Navigator Flowsheets 11/07/2014  Navigator Encounter Type Initial Visit  Patient Visit Type Medonc  Treatment Phase Abnormal Scans--CT  Barriers/Navigation Needs Education  International aid/development worker for Upcoming Proceduret;Other  Interventions Education Method  Education Method Verbal;Written  Support Groups/Services GI  Time Spent with Patient 15  Provided handout and verbal information on thrombocytopenia and bone marrow biopsy procedure. Radiology or Short Stay will be calling for the appointment. 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

## 2014-11-07 NOTE — Progress Notes (Signed)
[facility] OFFICE PROGRESS NOTE   Diagnosis: Rectal cancer  INTERVAL HISTORY:   Jacob [name] returns as scheduled. He feels well. He is working. He continues to have irregular bowel habits and an anal fissure. Mild tingling in the hands.  Objective:  Vital signs in last 24 hours:  Blood pressure 149/78, pulse 67, temperature 98.2 F (36.8 C), temperature source Oral, resp. rate 18, height '5\' 10"'  (1.778 m), weight 161 lb 14.4 oz (73.437 kg), SpO2 99 %.    HEENT: Neck without mass Lymphatics: No cervical, supraclavicular, or inguinal nodes. "Shotty "bilateral axillary nodes. Resp: Lungs clear bilaterally Cardio: Regular rate and rhythm GI: No hepatomegaly, no splenomegaly, nontender, no mass Vascular: No leg edema  Portacath/mild erythema overlying the port (sees he relates this to the lidocaine cream)   Lab Results:  Lab Results  Component Value Date   WBC 1.9* 11/07/2014   HGB 12.8* 11/07/2014   HCT 38.6 11/07/2014   MCV 88.9 11/07/2014   PLT 70* 11/07/2014   NEUTROABS 1.1* 11/07/2014   Blood smear: Few ovalocytes, the polychromasia is not increased. No nucleated red cells. The platelets are decreased in number. I saw one small platelet clump. The majority the platelet or normal in size. A few hypolobated neutrophils and bands forms. No blasts.  Imaging:  Ct Chest W Contrast  11/07/2014   CLINICAL DATA:  Rectal cancer with liver metastasis. Follow-up of lung nodules. Partial liver resection. Colon resection. Completed chemotherapy and radiation therapy.  EXAM: CT CHEST WITH CONTRAST  TECHNIQUE: Multidetector CT imaging of the chest was performed during intravenous contrast administration.  CONTRAST:  72m OMNIPAQUE IOHEXOL 300 MG/ML  SOLN  COMPARISON:  05/02/2014  FINDINGS: RECIST 1.1  Target Lesions:  1. None Non-target Lesions:  1. Partially cavitary 11 mm left lower lobe pulmonary nodule on image 39 is enlarged from 7 mm on the prior. 2. Right lower lobe  pulmonary nodule which measures 9 x 7 mm on 50 versus 9 x 9 mm on the prior. Mediastinum/Nodes: No supraclavicular adenopathy. Normal heart size, without pericardial effusion. No central pulmonary embolism, on this non-dedicated study. No mediastinal or definite hilar adenopathy, given limitations of unenhanced CT.  Lungs/Pleura: No pleural fluid. Right lower lobe pulmonary nodule which measures 9 x 7 mm on 50 versus 9 x 9 mm on the prior.  Left apical pulmonary nodule which measures 5 mm on image 9 and is unchanged.  A left lower lobe pulmonary nodule measures 9 mm on image 42 versus 6 mm on the prior.  Posterior lingular nodule is new or enlarged at 4 mm on image 49.  Partially cavitary 11 mm left lower lobe pulmonary nodule on image 39 is enlarged from 7 mm on the prior.  Upper abdomen: Surgical changes in the right lobe of the liver, without locally recurrent disease. Hypertrophy of the remainder of the liver is likely secondary. Normal imaged portions of the spleen, stomach, pancreas, adrenal glands, kidneys.  Musculoskeletal: No acute osseous abnormality.  IMPRESSION: 1. Progression of pulmonary metastasis. Slight enlargement of multiple left-sided pulmonary nodules. 2. No mediastinal adenopathy. 3. Surgical changes in the right lobe of the liver, without new or recurrent hepatic metastasis.   Electronically Signed   By: KAbigail MiyamotoM.D.   On: 11/07/2014 08:58    Medications: I have reviewed the patient's current medications.  Assessment/Plan: 1. Rectal cancer. Partially obstructing mass noted 1-2 cm from the anal verge on a colonoscopy 03/06/2012. Endoscopic ultrasound 03/14/2012 with a 7.5  mm thick, 3.2 cm wide hypoechoic, irregularly bordered mass that clearly passed into and through the muscularis propria layer of the distal rectal wall (uT3); 3 small (largest 7 mm) perirectal lymph nodes. The lymph nodes were all round, discrete, hypoechoic, homogenous; suspicious for malignant involvement  (uN1).  He began radiation and concurrent Xeloda chemotherapy on 03/20/2012, completed 04/27/2012.   Low anterior resection/coloanal anastomosis and diverting ileostomy 06/29/2012 with the final pathology revealing a T2N0 tumor with extensive fibrosis and negative margins.   Cycle 1 of adjuvant CAPOX 07/19/2012. Cycle 5 of adjuvant CAPOX 10/11/2012.   CEA 2.5 on 11/14/2012.   CEA 14.6 03/08/2013.   Restaging CT evaluation 03/08/2013 with a 4 mm pulmonary nodule left lung base not identified on comparison exam; new liver lesions including a 29 x 26 mm irregular peripheral enhancing rounded lesion in the dome of the right hepatic lobe, a less well-defined new subcapsular lesion in the lateral right hepatic lobe measuring 12 mm, a new subcapsular lesion in the anterior right hepatic lobe adjacent to the gallbladder fossa measuring 10 mm; a rounded low-density lesion in the inferior right hepatic lobe measuring 10 mm compared to 7 mm on the prior study (radiologist commented this may represent an enlarged cyst).   MRI of the abdomen 04/12/2013 confirmed multiple T2 hyperintense metastatic lesions throughout the right liver   Initiation of FOLFIRI/Avastin with genotype based irinotecan dosing per the [facility] study 04/05/2013.   Restaging CT evaluation on 06/20/2013 (after 2 cycles/4 treatments) showed improvement in the liver metastases and stable size of a left lower lobe pulmonary nodule now with central cavitation.   Continuation of FOLFIRI/Avastin.   Restaging CT evaluation 08/14/2013-decrease in the size of liver metastases, slight decrease in the size of a cavitary left lower lobe nodule, no evidence of disease progression.   Status post right hepatic lobectomy 09/17/2013. Pathology showed multiple foci of metastatic adenocarcinoma (5 nodules of metastatic adenocarcinoma 4 of which are subcapsular with the nodules ranging in size from 0.6-1.8 cm in greatest dimension). Margins not  involved. Biopsy of a portal lymph node showed benign adipose tissue; no lymph node tissue or malignancy.   Normal CEA 11/06/2013   CT 11/06/2013 with a new pleural-based right lower chest lesion, slight in enlargement of the a left-sided lung nodule  CT 01/29/2014 with a decrease in the right lower chest pleural-based lesion and a slight increase of a left-sided lung lesion, other lung lesions were stable  CT chest 05/02/2014 with a decrease in the size of a right lower lobe pulmonary nodule months similar size of a dominant left lower lobe nodule, minimal enlargement of a smaller left lower lobe nodule, no new site of disease  CT chest 11/07/2014 with a slight increase in several left-sided lung nodules 2. Irregular bowel habits/rectal bleeding secondary to #1. 3. History of Mild elevation of the liver enzymes. Question secondary to hepatic steatosis. 4. Indeterminate 8 mm posterior right liver lesion on the staging CT 03/06/2012. 5. Mildly elevated CEA at 5.7 on 03/06/2012. 6. History of radiation erythema at the groin and perineum 7. 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 No clinical evidence of thrombophlebitis or extravasation. 8. Delayed nausea following chemotherapy-Decadron prophylaxis was added with cycle 3 CAPOX-improved. 9. Ileostomy takedown 12/07/2012. 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 11. Port-A-Cath placement 12/31/204 12. Severe neutropenia secondary to chemotherapy following cycle 1 of FOLFIRI, chemotherapy was dose reduced and he received Neulasta with day 15 cycle 1  13.  Nausea and vomiting following cycle 1 of FOLFIRI 14. Rectal stricture 15. Leukopenia/Thrombocytopenia-persistent, potentially a sequelae of chemotherapy or hepatic toxicity from chemotherapy/radiation    Disposition:  Jacob [name] appears stable. I reviewed the chest CT images with him. There has been  slight enlargement of several left-sided lung nodules. I will present his case at the GI tumor conference and ask Dr. Servando Snare to review the CT. We will follow-up on the CEA from today. Jacob [name] will return for an office visit in 6 weeks.  He has persistent leukopenia/thrombocytopenia. This may be related to prolonged bone marrow toxicity from chemotherapy, hepatic toxicity from chemotherapy/radiation and surgery, or a primary bone marrow process such as myelodysplasia. He agrees to a diagnostic bone marrow biopsy. This will be scheduled for within the next few weeks.  Betsy Coder, MD  11/07/2014  1:16 PM

## 2014-11-08 ENCOUNTER — Telehealth: Payer: Self-pay | Admitting: *Deleted

## 2014-11-08 NOTE — Telephone Encounter (Signed)
Made pt aware of CEA results per Dr. Benay Spice; pt voices understanding and knows to call with any questions or concerns. No needs identified.

## 2014-11-08 NOTE — Telephone Encounter (Signed)
-----   Message from Ladell Pier, MD sent at 11/07/2014  7:40 PM EDT ----- Please call patient, cea is normal

## 2014-11-15 ENCOUNTER — Telehealth: Payer: Self-pay | Admitting: *Deleted

## 2014-11-15 NOTE — Telephone Encounter (Signed)
Left VM for King[place] to call back regarding GI Conference recommendations : Suggested to rescan in 3 months.

## 2014-11-19 ENCOUNTER — Other Ambulatory Visit: Payer: Self-pay | Admitting: Radiology

## 2014-11-19 NOTE — Telephone Encounter (Signed)
Attempted to reach Jacob [name] again regarding conference recommendations. Mail box is full. Will send him a MyChart message.

## 2014-11-20 ENCOUNTER — Ambulatory Visit (HOSPITAL_COMMUNITY)
Admission: RE | Admit: 2014-11-20 | Discharge: 2014-11-20 | Disposition: A | Discharge status: Home / Self Care | Payer: BC Managed Care – PPO | Source: Ambulatory Visit | Attending: Oncology | Admitting: Oncology

## 2014-11-20 ENCOUNTER — Encounter (HOSPITAL_COMMUNITY): Payer: Self-pay

## 2014-11-20 DIAGNOSIS — C20 Malignant neoplasm of rectum: Secondary | ICD-10-CM | POA: Insufficient documentation

## 2014-11-20 DIAGNOSIS — D649 Anemia, unspecified: Secondary | ICD-10-CM | POA: Diagnosis not present

## 2014-11-20 DIAGNOSIS — Z8249 Family history of ischemic heart disease and other diseases of the circulatory system: Secondary | ICD-10-CM | POA: Diagnosis not present

## 2014-11-20 DIAGNOSIS — Z809 Family history of malignant neoplasm, unspecified: Secondary | ICD-10-CM | POA: Insufficient documentation

## 2014-11-20 DIAGNOSIS — D696 Thrombocytopenia, unspecified: Secondary | ICD-10-CM | POA: Insufficient documentation

## 2014-11-20 DIAGNOSIS — D72819 Decreased white blood cell count, unspecified: Secondary | ICD-10-CM | POA: Insufficient documentation

## 2014-11-20 DIAGNOSIS — Z923 Personal history of irradiation: Secondary | ICD-10-CM | POA: Insufficient documentation

## 2014-11-20 DIAGNOSIS — C7800 Secondary malignant neoplasm of unspecified lung: Secondary | ICD-10-CM | POA: Diagnosis not present

## 2014-11-20 LAB — CBC WITH DIFFERENTIAL/PLATELET
Basophils Absolute: 0 K/uL (ref 0.0–0.1)
Basophils Relative: 0 % (ref 0–1)
Eosinophils Absolute: 0.1 K/uL (ref 0.0–0.7)
Eosinophils Relative: 3 % (ref 0–5)
HCT: 42.4 % (ref 39.0–52.0)
Hemoglobin: 14 g/dL (ref 13.0–17.0)
Lymphocytes Relative: 23 % (ref 12–46)
Lymphs Abs: 0.6 K/uL — ABNORMAL LOW (ref 0.7–4.0)
MCH: 30 pg (ref 26.0–34.0)
MCHC: 33 g/dL (ref 30.0–36.0)
MCV: 90.8 fL (ref 78.0–100.0)
Monocytes Absolute: 0.2 K/uL (ref 0.1–1.0)
Monocytes Relative: 9 % (ref 3–12)
Neutro Abs: 1.7 K/uL (ref 1.7–7.7)
Neutrophils Relative %: 65 % (ref 43–77)
Platelets: 85 K/uL — ABNORMAL LOW (ref 150–400)
RBC: 4.67 MIL/uL (ref 4.22–5.81)
RDW: 14.1 % (ref 11.5–15.5)
WBC: 2.6 K/uL — ABNORMAL LOW (ref 4.0–10.5)

## 2014-11-20 LAB — PROTIME-INR
INR: 1.05 (ref 0.00–1.49)
Prothrombin Time: 13.9 seconds (ref 11.6–15.2)

## 2014-11-20 LAB — BONE MARROW EXAM: Bone Marrow Exam: 651

## 2014-11-20 MED ORDER — FENTANYL CITRATE (PF) 100 MCG/2ML IJ SOLN
INTRAMUSCULAR | Status: AC | PRN
  Administered 2014-11-20: 25 ug via INTRAVENOUS
  Administered 2014-11-20: 50 ug via INTRAVENOUS

## 2014-11-20 MED ORDER — FENTANYL CITRATE (PF) 100 MCG/2ML IJ SOLN
INTRAMUSCULAR | Status: AC
  Filled 2014-11-20: qty 6

## 2014-11-20 MED ORDER — MIDAZOLAM HCL 2 MG/2ML IJ SOLN
INTRAMUSCULAR | Status: AC
  Filled 2014-11-20: qty 6

## 2014-11-20 MED ORDER — MIDAZOLAM HCL 2 MG/2ML IJ SOLN
INTRAMUSCULAR | Status: AC | PRN
  Administered 2014-11-20: 0.5 mg via INTRAVENOUS
  Administered 2014-11-20: 1 mg via INTRAVENOUS

## 2014-11-20 MED ORDER — SODIUM CHLORIDE 0.9 % IV SOLN
INTRAVENOUS | Status: DC
  Administered 2014-11-20: 08:00:00 via INTRAVENOUS

## 2014-11-20 NOTE — Procedures (Signed)
Interventional Radiology Procedure Note  Procedure: CT guided aspirate and core biopsy of right iliac bone Complications: None Recommendations: Bedrest x 1 hour.  Venetia Night. Kathlene Cote, M.D Pager:  [phone]

## 2014-11-20 NOTE — H&P (Signed)
Chief Complaint: Patient was seen in consultation today for leukopenia; thrombocytopenia at the request of Sherrill,Gary B  Referring Physician(s): Sherrill,Gary B  History of Present Illness: Jacob [name] is a 44 y.o. male   Rectal Ca Hx Previous chemo and radiation therapy Persistent leukopenia and thrombocytopenia Request for Bone Marrow Biopsy per Dr Benay Spice   Past Medical History  Diagnosis Date  . Chicken pox as a child    ?  . Hernia 6 months old  . Allergic state 01/19/2012  . Reflux 01/19/2012  . Elevated liver function tests 01/19/2012  . Overweight(278.02) 01/19/2012  . Preventative health care 01/19/2012  . Sun-damaged skin 01/19/2012  . Migraine headache as a child  . Hx of migraines   . Radiation 03/20/12-04/27/12    Pelvis 50.4 gray Rectal cancer  . Anxiety   . Fatty liver   . Numbness     TOES OF BOTH FEET.  . Pain     LEFT HEEL PAIN -SEVERE ESPECIALLY AFTER SITTING OR LYING DOWN - DIFFICULT TO GET OUT OF BED AND WALK IN THE MORNINGS BECAUSE OF HEEL PAIN  . Anemia 12/31/2012  . Dysrhythmia as child    HX OF IRREGULAR HB AT TIME OF TONSILLECTOMY - SURGERY WAS NOT DONE-CAN'T REMEMBER ANY OTHER DETAILS- NEVER HAD THE SURGERY.  . 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 Lung abnormality 2015  . History of diarrhea     better since chemo finished  . Lung nodule 08/2013  . Lung nodule, multiple 11/29/2013  . 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 Cancer 03/06/12    rectal bx=Adenocarcinoma  PT HAD RADIATION , CHEMO SURGERY    Past Surgical History  Procedure Laterality Date  . Rectal biopsy  03/06/12    distal mass1-2cm from anal verge=Adenocarcinoma  . Eus  03/14/2012    Procedure: LOWER ENDOSCOPIC ULTRASOUND (EUS);  Surgeon: Milus Banister, MD;  Location: [facility];  Service: Endoscopy;  Laterality: N/A;  . Hernia repair  28 months old    right inguinal repair  . Cyst excision     L EAR AREA  . Laparoscopic low anterior resection N/A 06/29/2012    Procedure: LAPAROSCOPIC LOW ANTERIOR RESECTION, mobilization splenic flexure,coloanal anastomosis,diverting ileostomy;  Surgeon: Leighton Ruff, MD;  Location: [facility];  Service: General;  Laterality: N/A;  . Ileostomy closure N/A 12/07/2012    Procedure: ILEOSTOMY TAKEDOWN;  Surgeon: Leighton Ruff, MD;  Location: [facility];  Service: General;  Laterality: N/A;  . Portacath placement Left 03/21/2013    Procedure: INSERTION PORT-A-CATH;  Surgeon: Leighton Ruff, MD;  Location: [facility];  Service: General;  Laterality: Left;  . Open hepatectomy [83] N/A 09/17/2013    Procedure: OPEN HEPATECTOMY;  Surgeon: Stark Klein, MD;  Location: [facility];  Service: General;  Laterality: N/A;  . Laparoscopy N/A 09/17/2013    Procedure: LAPAROSCOPY DIAGNOSTIC;  Surgeon: Stark Klein, MD;  Location: [facility];  Service: General;  Laterality: N/A;  . Liver ultrasound N/A 09/17/2013    Procedure: LIVER ULTRASOUND;  Surgeon: Stark Klein, MD;  Location: [facility];  Service: General;  Laterality: N/A;  . Cholecystectomy N/A 09/17/2013    Procedure: CHOLECYSTECTOMY;  Surgeon: Stark Klein, MD;  Location: [facility];  Service: General;  Laterality: N/A;    Allergies: Penicillins  Medications: Prior to Admission medications   Medication Sig Start Date End Date Taking? Authorizing Provider  ALPRAZolam Duanne Moron) 0.25 MG tablet Take 2  tablets (0.5 mg total) by mouth daily as needed for anxiety or sleep. 12/18/13  Yes Owens Shark, NP  docusate sodium (COLACE) 100 MG capsule Take 100 mg by mouth daily as needed for mild constipation or moderate constipation. Occasionally bid   Yes Historical Provider, MD  naproxen sodium (ALEVE) 220 MG tablet Take 220 mg by mouth daily as needed (for headache).   Yes Historical Provider, MD  lidocaine-prilocaine (EMLA) cream Apply 1 application topically as needed. Apply to port site 1 hour prior to use. Patient not taking: Reported on 11/07/2014  12/18/13   Owens Shark, NP     Family History  Problem Relation Age of Onset  . Adopted: Yes  . Cancer Father   . Heart disease Father   . Colon cancer Neg Hx     Social History   Social History  . Marital Status: Married    Spouse Name: N/A  . Number of Children: 1  . Years of Education: N/A   Occupational History  . Scultor/Professor     [employer]   Social History Main Topics  . Smoking status: Never Smoker   . Smokeless tobacco: Never Used  . Alcohol Use: 0.0 oz/week    0 Standard drinks or equivalent per week     Comment: RARE   . Drug Use: No     Comment: marijuana  . Sexual Activity:    Partners: Female   Other Topics Concern  . None   Social History Narrative   Married to wife, [name]   Has one 26 year old child   Occupation: Emergency planning/management officer at [employer]: A 12 point ROS discussed and pertinent positives are indicated in the HPI above.  All other systems are negative.  Review of Systems  Constitutional: Negative for fever and activity change.  Respiratory: Negative for shortness of breath.   Cardiovascular: Negative for chest pain.  Gastrointestinal: Positive for abdominal pain.  Musculoskeletal: Negative for back pain.  Neurological: Positive for weakness.  Psychiatric/Behavioral: Negative for behavioral problems and confusion.    Vital Signs: BP 121/84 mmHg  Pulse 58  Temp(Src) 97.7 F (36.5 C) (Oral)  Resp 16  Ht '5\' 10"'  (1.778 m)  Wt 161 lb 14.4 oz (73.437 kg)  BMI 23.23 kg/m2  SpO2 100%  Physical Exam  Constitutional: He is oriented to person, place, and time.  Cardiovascular: Normal rate, regular rhythm and normal heart sounds.   No murmur heard. Pulmonary/Chest: Effort normal and breath sounds normal. He has no wheezes.  Abdominal: Soft. Bowel sounds are normal. There is tenderness.  Musculoskeletal: Normal range of motion.  Neurological: He is alert and oriented to person, place, and time.  Skin: Skin is warm  and dry.  Psychiatric: He has a normal mood and affect. His behavior is normal. Judgment and thought content normal.  Nursing note and vitals reviewed.   Mallampati Score:  MD Evaluation Airway: WNL Heart: WNL Abdomen: WNL Chest/ Lungs: WNL ASA  Classification: 3 Mallampati/Airway Score: One  Imaging: Ct Chest W Contrast  11/07/2014   CLINICAL DATA:  Rectal cancer with liver metastasis. Follow-up of lung nodules. Partial liver resection. Colon resection. Completed chemotherapy and radiation therapy.  EXAM: CT CHEST WITH CONTRAST  TECHNIQUE: Multidetector CT imaging of the chest was performed during intravenous contrast administration.  CONTRAST:  48m OMNIPAQUE IOHEXOL 300 MG/ML  SOLN  COMPARISON:  05/02/2014  FINDINGS: RECIST 1.1  Target Lesions:  1. None Non-target Lesions:  1. Partially cavitary 11 mm left lower lobe pulmonary nodule on image 39 is enlarged from 7 mm on the prior. 2. Right lower lobe pulmonary nodule which measures 9 x 7 mm on 50 versus 9 x 9 mm on the prior. Mediastinum/Nodes: No supraclavicular adenopathy. Normal heart size, without pericardial effusion. No central pulmonary embolism, on this non-dedicated study. No mediastinal or definite hilar adenopathy, given limitations of unenhanced CT.  Lungs/Pleura: No pleural fluid. Right lower lobe pulmonary nodule which measures 9 x 7 mm on 50 versus 9 x 9 mm on the prior.  Left apical pulmonary nodule which measures 5 mm on image 9 and is unchanged.  A left lower lobe pulmonary nodule measures 9 mm on image 42 versus 6 mm on the prior.  Posterior lingular nodule is new or enlarged at 4 mm on image 49.  Partially cavitary 11 mm left lower lobe pulmonary nodule on image 39 is enlarged from 7 mm on the prior.  Upper abdomen: Surgical changes in the right lobe of the liver, without locally recurrent disease. Hypertrophy of the remainder of the liver is likely secondary. Normal imaged portions of the spleen, stomach, pancreas, adrenal  glands, kidneys.  Musculoskeletal: No acute osseous abnormality.  IMPRESSION: 1. Progression of pulmonary metastasis. Slight enlargement of multiple left-sided pulmonary nodules. 2. No mediastinal adenopathy. 3. Surgical changes in the right lobe of the liver, without new or recurrent hepatic metastasis.   Electronically Signed   By: Abigail Miyamoto M.D.   On: 11/07/2014 08:58    Labs:  CBC:  Recent Labs  04/25/14 1258 07/25/14 1059 11/07/14 0842 11/20/14 0720  WBC 2.9* 2.6* 1.9* 2.6*  HGB 12.9* 13.0 12.8* 14.0  HCT 38.9 38.9 38.6 42.4  PLT 84* 67* 70* 85*    COAGS:  Recent Labs  11/20/14 0720  INR 1.05    BMP:  Recent Labs  01/28/14 0913 04/25/14 1258 09/05/14 1459 11/07/14 0843  NA 141 144 143 141  K 3.6 3.6 4.0 3.8  CL  --   --  109  --   CO2 '26 25 24 26  ' GLUCOSE 96 85 89 91  BUN 11.6 14.5 13 16.0  CALCIUM 8.7 9.1 8.9 8.9  CREATININE 0.8 0.8 0.83 0.8    LIVER FUNCTION TESTS:  Recent Labs  01/28/14 0913 04/25/14 1258 09/05/14 1459 11/07/14 0843  BILITOT 0.29 0.33 0.3 0.52  AST '26 30 18 16  ' ALT '29 30 17 19  ' ALKPHOS 82 75 54 52  PROT 6.1* 6.6 6.0 6.1*  ALBUMIN 3.3* 3.9 3.7 3.8    TUMOR MARKERS:  Recent Labs  01/28/14 0913 04/25/14 1258 09/05/14 0930 11/07/14 0842  CEA 0.7 0.8 0.8 0.7    Assessment and Plan:  Rectal ca hx Leukopenia/thrombocytopenia Now for BM bx Risks and Benefits discussed with the patient including, but not limited to bleeding, infection, damage to adjacent structures or low yield requiring additional tests. All of the patient's questions were answered, patient is agreeable to proceed. Consent signed and in chart.   Thank you for this interesting consult.  I greatly enjoyed meeting FOX SALMINEN and look forward to participating in their care.  A copy of this report was sent to the requesting provider on this date.  Signed: Jenny Lai A 11/20/2014, 8:24 AM   I spent a total of  30 Minutes   in face to face in  clinical consultation, greater than 50% of which was counseling/coordinating care for BM bx

## 2014-11-20 NOTE — Progress Notes (Signed)
Pt. Has a power port, pt. Stated" I want you to stick me instead" peripheral IV started per pt's request.

## 2014-11-20 NOTE — Discharge Instructions (Signed)
Bone Marrow Aspiration, Bone Marrow Biopsy °Care After °Read the instructions outlined below and refer to this sheet in the next few weeks. These discharge instructions provide you with general information on caring for yourself after you leave the hospital. Your caregiver may also give you specific instructions. While your treatment has been planned according to the most current medical practices available, unavoidable complications occasionally occur. If you have any problems or questions after discharge, call your caregiver. °FINDING OUT THE RESULTS OF YOUR TEST °Not all test results are available during your visit. If your test results are not back during the visit, make an appointment with your caregiver to find out the results. Do not assume everything is normal if you have not heard from your caregiver or the medical facility. It is important for you to follow up on all of your test results.  °HOME CARE INSTRUCTIONS  °You have had sedation and may be sleepy or dizzy. Your thinking may not be as clear as usual. For the next 24 hours: °· Only take over-the-counter or prescription medicines for pain, discomfort, and or fever as directed by your caregiver. °· Do not drink alcohol. °· Do not smoke. °· Do not drive. °· Do not make important legal decisions. °· Do not operate heavy machinery. °· Do not care for small children by yourself. °· Keep your dressing clean and dry. You may replace dressing with a bandage after 24 hours. °· You may take a bath or shower after 24 hours. °· Use an ice pack for 20 minutes every 2 hours while awake for pain as needed. °SEEK MEDICAL CARE IF:  °· There is redness, swelling, or increasing pain at the biopsy site. °· There is pus coming from the biopsy site. °· There is drainage from a biopsy site lasting longer than one day. °· An unexplained oral temperature above 102° F (38.9° C) develops. °SEEK IMMEDIATE MEDICAL CARE IF:  °· You develop a rash. °· You have difficulty  breathing. °· You develop any reaction or side effects to medications given. °Document Released: 09/25/2004 Document Revised: 05/31/2011 Document Reviewed: 03/05/2008 °ExitCare® Patient Information ©2015 [employer]. This information is not intended to replace advice given to you by your health care provider. Make sure you discuss any questions you have with your health care provider. °Conscious Sedation, Adult, Care After °Refer to this sheet in the next few weeks. These instructions provide you with information on caring for yourself after your procedure. Your health care provider may also give you more specific instructions. Your treatment has been planned according to current medical practices, but problems sometimes occur. Call your health care provider if you have any problems or questions after your procedure. °WHAT TO EXPECT AFTER THE PROCEDURE  °After your procedure: °· You may feel sleepy, clumsy, and have poor balance for several hours. °· Vomiting may occur if you eat too soon after the procedure. °HOME CARE INSTRUCTIONS °· Do not participate in any activities where you could become injured for at least 24 hours. Do not: °¨ Drive. °¨ Swim. °¨ Ride a bicycle. °¨ Operate heavy machinery. °¨ Cook. °¨ Use power tools. °¨ Climb ladders. °¨ Work from a high place. °· Do not make important decisions or sign legal documents until you are improved. °· If you vomit, drink water, juice, or soup when you can drink without vomiting. Make sure you have little or no nausea before eating solid foods. °· Only take over-the-counter or prescription medicines for pain, discomfort, or fever   as directed by your health care provider.  Make sure you and your family fully understand everything about the medicines given to you, including what side effects may occur.  You should not drink alcohol, take sleeping pills, or take medicines that cause drowsiness for at least 24 hours.  If you smoke, do not smoke without  supervision.  If you are feeling better, you may resume normal activities 24 hours after you were sedated.  Keep all appointments with your health care provider. SEEK MEDICAL CARE IF:  Your skin is pale or bluish in color.  You continue to feel nauseous or vomit.  Your pain is getting worse and is not helped by medicine.  You have bleeding or swelling.  You are still sleepy or feeling clumsy after 24 hours. SEEK IMMEDIATE MEDICAL CARE IF:  You develop a rash.  You have difficulty breathing.  You develop any type of allergic problem.  You have a fever. MAKE SURE YOU:  Understand these instructions.  Will watch your condition.  Will get help right away if you are not doing well or get worse. Document Released: 12/27/2012 Document Reviewed: 12/27/2012 [facility] Patient Information 2015 [place], [place]. This information is not intended to replace advice given to you by your health care provider. Make sure you discuss any questions you have with your health care provider.

## 2014-11-22 ENCOUNTER — Telehealth: Payer: Self-pay | Admitting: *Deleted

## 2014-11-22 NOTE — Telephone Encounter (Signed)
-----   Message from Ladell Pier, MD sent at 11/21/2014  8:28 PM EDT ----- Please call patient, bone marrow unremarkable, low blood counts likely related to liver dysfunction from surgery and chemotherapy

## 2014-11-27 NOTE — Telephone Encounter (Signed)
Patient called returning call from collaborative nurse.  Results provided as noted on 11-21-2014 from Dr. Benay Spice.  Jacob [name] asked to speak with Navigator.  Call transferred.  Voicemail received.

## 2014-11-29 ENCOUNTER — Telehealth: Payer: Self-pay | Admitting: *Deleted

## 2014-11-29 LAB — CHROMOSOME ANALYSIS, BONE MARROW

## 2014-11-29 NOTE — Telephone Encounter (Signed)
Left VM that GI Conference suggested to re scan him in November and then compare and take it from there. No action required at this time.

## 2014-12-10 ENCOUNTER — Encounter (HOSPITAL_COMMUNITY): Payer: Self-pay

## 2014-12-18 ENCOUNTER — Ambulatory Visit: Payer: BC Managed Care – PPO

## 2014-12-18 ENCOUNTER — Telehealth: Payer: Self-pay | Admitting: Oncology

## 2014-12-18 ENCOUNTER — Other Ambulatory Visit (HOSPITAL_BASED_OUTPATIENT_CLINIC_OR_DEPARTMENT_OTHER): Payer: BC Managed Care – PPO

## 2014-12-18 ENCOUNTER — Ambulatory Visit (HOSPITAL_BASED_OUTPATIENT_CLINIC_OR_DEPARTMENT_OTHER): Payer: BC Managed Care – PPO | Admitting: Nurse Practitioner

## 2014-12-18 VITALS — BP 126/84 | HR 73 | Temp 97.7°F | Resp 16 | Wt 162.0 lb

## 2014-12-18 VITALS — BP 126/84 | HR 73 | Temp 97.7°F | Resp 16 | Ht 70.0 in | Wt 162.3 lb

## 2014-12-18 DIAGNOSIS — Z23 Encounter for immunization: Secondary | ICD-10-CM | POA: Diagnosis not present

## 2014-12-18 DIAGNOSIS — C787 Secondary malignant neoplasm of liver and intrahepatic bile duct: Secondary | ICD-10-CM | POA: Diagnosis not present

## 2014-12-18 DIAGNOSIS — Z95828 Presence of other vascular implants and grafts: Secondary | ICD-10-CM

## 2014-12-18 DIAGNOSIS — C801 Malignant (primary) neoplasm, unspecified: Secondary | ICD-10-CM

## 2014-12-18 DIAGNOSIS — C20 Malignant neoplasm of rectum: Secondary | ICD-10-CM

## 2014-12-18 LAB — CBC WITH DIFFERENTIAL/PLATELET
BASO%: 0.5 % (ref 0.0–2.0)
Basophils Absolute: 0 10*3/uL (ref 0.0–0.1)
EOS ABS: 0.1 10*3/uL (ref 0.0–0.5)
EOS%: 2.2 % (ref 0.0–7.0)
HCT: 40.9 % (ref 38.4–49.9)
HGB: 13.5 g/dL (ref 13.0–17.1)
LYMPH%: 16.5 % (ref 14.0–49.0)
MCH: 29.7 pg (ref 27.2–33.4)
MCHC: 32.9 g/dL (ref 32.0–36.0)
MCV: 90.4 fL (ref 79.3–98.0)
MONO#: 0.3 10*3/uL (ref 0.1–0.9)
MONO%: 10.4 % (ref 0.0–14.0)
NEUT#: 2.2 10*3/uL (ref 1.5–6.5)
NEUT%: 70.4 % (ref 39.0–75.0)
PLATELETS: 97 10*3/uL — AB (ref 140–400)
RBC: 4.52 10*6/uL (ref 4.20–5.82)
RDW: 14.3 % (ref 11.0–14.6)
WBC: 3.1 10*3/uL — ABNORMAL LOW (ref 4.0–10.3)
lymph#: 0.5 10*3/uL — ABNORMAL LOW (ref 0.9–3.3)

## 2014-12-18 MED ORDER — HEPARIN SOD (PORK) LOCK FLUSH 100 UNIT/ML IV SOLN
500.0000 [IU] | Freq: Once | INTRAVENOUS | Status: AC
  Administered 2014-12-18: 500 [IU] via INTRAVENOUS
  Filled 2014-12-18: qty 5

## 2014-12-18 MED ORDER — SODIUM CHLORIDE 0.9 % IJ SOLN
10.0000 mL | INTRAMUSCULAR | Status: DC | PRN
  Administered 2014-12-18: 10 mL via INTRAVENOUS
  Filled 2014-12-18: qty 10

## 2014-12-18 MED ORDER — INFLUENZA VAC SPLIT QUAD 0.5 ML IM SUSY
0.5000 mL | PREFILLED_SYRINGE | Freq: Once | INTRAMUSCULAR | Status: AC
  Administered 2014-12-18: 0.5 mL via INTRAMUSCULAR
  Filled 2014-12-18: qty 0.5

## 2014-12-18 NOTE — Progress Notes (Signed)
[place] OFFICE PROGRESS NOTE   Diagnosis:  Rectal cancer  INTERVAL HISTORY:   Jacob [name] returns as scheduled. Overall he feels well. He has a good appetite. He continues to work. Bowel habits remain irregular. He denies pain. No shortness of breath. His main concern is that he does not ejaculate.  Objective:  Vital signs in last 24 hours:  Blood pressure 126/84, pulse 73, temperature 97.7 F (36.5 C), temperature source Oral, resp. rate 16, height '5\' 10"'  (1.778 m), weight 162 lb 4.8 oz (73.619 kg), SpO2 100 %.    HEENT: No thrush or ulcers. Lymphatics: No palpable cervical, supraclavicular, axillary or inguinal lymph nodes. Resp: Lungs clear bilaterally. Cardio: Regular rate and rhythm. GI: Abdomen soft and nontender. No hepatomegaly. No mass. Vascular: No leg edema. Port-A-Cath without erythema.   Lab Results:  Lab Results  Component Value Date   WBC 3.1* 12/18/2014   HGB 13.5 12/18/2014   HCT 40.9 12/18/2014   MCV 90.4 12/18/2014   PLT 97* 12/18/2014   NEUTROABS 2.2 12/18/2014    Imaging:  No results found.  Medications: I have reviewed the patient's current medications.  Assessment/Plan: 1. Rectal cancer. Partially obstructing mass noted 1-2 cm from the anal verge on a colonoscopy 03/06/2012. Endoscopic ultrasound 03/14/2012 with a 7.5 mm thick, 3.2 cm wide hypoechoic, irregularly bordered mass that clearly passed into and through the muscularis propria layer of the distal rectal wall (uT3); 3 small (largest 7 mm) perirectal lymph nodes. The lymph nodes were all round, discrete, hypoechoic, homogenous; suspicious for malignant involvement (uN1).  He began radiation and concurrent Xeloda chemotherapy on 03/20/2012, completed 04/27/2012.   Low anterior resection/coloanal anastomosis and diverting ileostomy 06/29/2012 with the final pathology revealing a T2N0 tumor with extensive fibrosis and negative margins.   Cycle 1 of adjuvant CAPOX  07/19/2012. Cycle 5 of adjuvant CAPOX 10/11/2012.   CEA 2.5 on 11/14/2012.   CEA 14.6 03/08/2013.   Restaging CT evaluation 03/08/2013 with a 4 mm pulmonary nodule left lung base not identified on comparison exam; new liver lesions including a 29 x 26 mm irregular peripheral enhancing rounded lesion in the dome of the right hepatic lobe, a less well-defined new subcapsular lesion in the lateral right hepatic lobe measuring 12 mm, a new subcapsular lesion in the anterior right hepatic lobe adjacent to the gallbladder fossa measuring 10 mm; a rounded low-density lesion in the inferior right hepatic lobe measuring 10 mm compared to 7 mm on the prior study (radiologist commented this may represent an enlarged cyst).   MRI of the abdomen 04/12/2013 confirmed multiple T2 hyperintense metastatic lesions throughout the right liver   Initiation of FOLFIRI/Avastin with genotype based irinotecan dosing per the [facility] study 04/05/2013.   Restaging CT evaluation on 06/20/2013 (after 2 cycles/4 treatments) showed improvement in the liver metastases and stable size of a left lower lobe pulmonary nodule now with central cavitation.   Continuation of FOLFIRI/Avastin.   Restaging CT evaluation 08/14/2013-decrease in the size of liver metastases, slight decrease in the size of a cavitary left lower lobe nodule, no evidence of disease progression.   Status post right hepatic lobectomy 09/17/2013. Pathology showed multiple foci of metastatic adenocarcinoma (5 nodules of metastatic adenocarcinoma 4 of which are subcapsular with the nodules ranging in size from 0.6-1.8 cm in greatest dimension). Margins not involved. Biopsy of a portal lymph node showed benign adipose tissue; no lymph node tissue or malignancy.   Normal CEA 11/06/2013   CT 11/06/2013 with a  new pleural-based right lower chest lesion, slight in enlargement of the a left-sided lung nodule  CT 01/29/2014 with a decrease in the right lower  chest pleural-based lesion and a slight increase of a left-sided lung lesion, other lung lesions were stable  CT chest 05/02/2014 with a decrease in the size of a right lower lobe pulmonary nodule months similar size of a dominant left lower lobe nodule, minimal enlargement of a smaller left lower lobe nodule, no new site of disease  CT chest 11/07/2014 with a slight increase in several left-sided lung nodules 2. Irregular bowel habits/rectal bleeding secondary to #1. 3. History of Mild elevation of the liver enzymes. Question secondary to hepatic steatosis. 4. Indeterminate 8 mm posterior right liver lesion on the staging CT 03/06/2012. 5. Mildly elevated CEA at 5.7 on 03/06/2012. 6. History of radiation erythema at the groin and perineum 7. 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 No clinical evidence of thrombophlebitis or extravasation. 8. Delayed nausea following chemotherapy-Decadron prophylaxis was added with cycle 3 CAPOX-improved. 9. Ileostomy takedown 12/07/2012. 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 11. Port-A-Cath placement 12/31/204 12. Severe neutropenia secondary to chemotherapy following cycle 1 of FOLFIRI, chemotherapy was dose reduced and he received Neulasta with day 15 cycle 1  13. Nausea and vomiting following cycle 1 of FOLFIRI 14. Rectal stricture 15. Leukopenia/Thrombocytopenia-persistent, potentially a sequelae of chemotherapy or hepatic toxicity from chemotherapy/radiation. Bone marrow biopsy 11/20/2014 showed cellular bone marrow with trilineage hematopoiesis. Significant dyspoiesis was not present and there was no evidence of metastatic carcinoma. Cytogenetic analysis showed the presence of normal male chromosomes with no observable clonal chromosomal abnormalities.     Disposition: Jacob [name] appears stable. The plan is to obtain a follow-up chest CT in approximately 6 weeks  which will be a three-month interval from the previous.  He has persistent leukopenia/thrombocytopenia, though the counts have improved mildly over the past 6 weeks. The recent bone marrow biopsy with cytogenetics was unremarkable. He understands the leukopenia/thrombocytopenia is likely related to previous chemotherapy/radiation and surgery.  We made a referral to urology to evaluate his inability to ejaculate.  He will return for a follow-up visit in 6 weeks to review the chest CT findings. He will contact the office in the interim with any problems. The influenza vaccine will be administered today.  Plan reviewed with Dr. Benay Spice.    Ned Card ANP/GNP-BC   12/18/2014  9:56 AM

## 2014-12-18 NOTE — Telephone Encounter (Signed)
Pt confirmed labs/ov per 09/28 POF, gave pt AVS and Calendar... KJ °

## 2014-12-18 NOTE — Addendum Note (Signed)
Addended by: Lenox Ponds E on: 12/18/2014 10:25 AM   Modules accepted: Orders

## 2014-12-18 NOTE — Patient Instructions (Signed)

## 2014-12-20 ENCOUNTER — Encounter: Payer: Self-pay | Admitting: *Deleted

## 2014-12-20 DIAGNOSIS — C787 Secondary malignant neoplasm of liver and intrahepatic bile duct: Secondary | ICD-10-CM

## 2014-12-20 NOTE — Progress Notes (Signed)
Notified Debbra Riding, [facility] CT, via [facility] email that RECIST measurements are no longer required for this patient. Cindy S. Brigitte Pulse BSN, RN, CCRP 12/20/2014 1:19 PM

## 2015-02-03 ENCOUNTER — Other Ambulatory Visit: Payer: BC Managed Care – PPO

## 2015-02-03 ENCOUNTER — Ambulatory Visit (HOSPITAL_COMMUNITY): Admission: RE | Admit: 2015-02-03 | Payer: BC Managed Care – PPO | Source: Ambulatory Visit

## 2015-02-04 ENCOUNTER — Telehealth: Payer: Self-pay | Admitting: Oncology

## 2015-02-04 NOTE — Telephone Encounter (Signed)
Patient left message needing to r/s appointments due wife was in a car accident. Per patient he has already r/s ct to 11/21. Per desk nurse patient can be scheduled with LT 11/22 or BS 11/23. Lab r/s to 11/21 and f/u r/s to 11/23. Left message for patient and mailed schedule.

## 2015-02-06 ENCOUNTER — Ambulatory Visit: Payer: BC Managed Care – PPO | Admitting: Oncology

## 2015-02-10 ENCOUNTER — Other Ambulatory Visit (HOSPITAL_BASED_OUTPATIENT_CLINIC_OR_DEPARTMENT_OTHER): Payer: BC Managed Care – PPO

## 2015-02-10 ENCOUNTER — Ambulatory Visit (HOSPITAL_COMMUNITY)
Admission: RE | Admit: 2015-02-10 | Discharge: 2015-02-10 | Disposition: A | Discharge status: Home / Self Care | Payer: BC Managed Care – PPO | Source: Ambulatory Visit | Attending: Nurse Practitioner | Admitting: Nurse Practitioner

## 2015-02-10 DIAGNOSIS — C20 Malignant neoplasm of rectum: Secondary | ICD-10-CM | POA: Diagnosis not present

## 2015-02-10 DIAGNOSIS — C787 Secondary malignant neoplasm of liver and intrahepatic bile duct: Secondary | ICD-10-CM | POA: Diagnosis not present

## 2015-02-10 DIAGNOSIS — Z9221 Personal history of antineoplastic chemotherapy: Secondary | ICD-10-CM | POA: Diagnosis not present

## 2015-02-10 DIAGNOSIS — R590 Localized enlarged lymph nodes: Secondary | ICD-10-CM | POA: Insufficient documentation

## 2015-02-10 DIAGNOSIS — R918 Other nonspecific abnormal finding of lung field: Secondary | ICD-10-CM | POA: Diagnosis present

## 2015-02-10 DIAGNOSIS — Z923 Personal history of irradiation: Secondary | ICD-10-CM | POA: Diagnosis not present

## 2015-02-10 LAB — COMPREHENSIVE METABOLIC PANEL (CC13)
ALBUMIN: 3.9 g/dL (ref 3.5–5.0)
ALK PHOS: 54 U/L (ref 40–150)
ALT: 19 U/L (ref 0–55)
ANION GAP: 8 meq/L (ref 3–11)
AST: 18 U/L (ref 5–34)
BUN: 13 mg/dL (ref 7.0–26.0)
CALCIUM: 9.3 mg/dL (ref 8.4–10.4)
CO2: 24 mEq/L (ref 22–29)
Chloride: 109 mEq/L (ref 98–109)
Creatinine: 0.9 mg/dL (ref 0.7–1.3)
GLUCOSE: 92 mg/dL (ref 70–140)
POTASSIUM: 4 meq/L (ref 3.5–5.1)
SODIUM: 142 meq/L (ref 136–145)
Total Bilirubin: 0.52 mg/dL (ref 0.20–1.20)
Total Protein: 6.4 g/dL (ref 6.4–8.3)

## 2015-02-10 LAB — CBC WITH DIFFERENTIAL/PLATELET
BASO%: 0.3 % (ref 0.0–2.0)
BASOS ABS: 0 10*3/uL (ref 0.0–0.1)
EOS ABS: 0.1 10*3/uL (ref 0.0–0.5)
EOS%: 1.6 % (ref 0.0–7.0)
HCT: 40.5 % (ref 38.4–49.9)
HEMOGLOBIN: 13.4 g/dL (ref 13.0–17.1)
LYMPH%: 17.6 % (ref 14.0–49.0)
MCH: 29.7 pg (ref 27.2–33.4)
MCHC: 33.1 g/dL (ref 32.0–36.0)
MCV: 89.5 fL (ref 79.3–98.0)
MONO#: 0.3 10*3/uL (ref 0.1–0.9)
MONO%: 8.2 % (ref 0.0–14.0)
NEUT#: 2.4 10*3/uL (ref 1.5–6.5)
NEUT%: 72.3 % (ref 39.0–75.0)
PLATELETS: 97 10*3/uL — AB (ref 140–400)
RBC: 4.53 10*6/uL (ref 4.20–5.82)
RDW: 13.5 % (ref 11.0–14.6)
WBC: 3.3 10*3/uL — ABNORMAL LOW (ref 4.0–10.3)
lymph#: 0.6 10*3/uL — ABNORMAL LOW (ref 0.9–3.3)

## 2015-02-10 MED ORDER — IOHEXOL 300 MG/ML  SOLN
75.0000 mL | Freq: Once | INTRAMUSCULAR | Status: AC | PRN
  Administered 2015-02-10: 75 mL via INTRAVENOUS

## 2015-02-11 LAB — CEA: CEA: 1.6 ng/mL (ref 0.0–5.0)

## 2015-02-12 ENCOUNTER — Ambulatory Visit (HOSPITAL_BASED_OUTPATIENT_CLINIC_OR_DEPARTMENT_OTHER): Payer: BC Managed Care – PPO | Admitting: Oncology

## 2015-02-12 ENCOUNTER — Telehealth: Payer: Self-pay | Admitting: Oncology

## 2015-02-12 ENCOUNTER — Encounter: Payer: Self-pay | Admitting: *Deleted

## 2015-02-12 VITALS — BP 128/71 | HR 68 | Temp 98.5°F | Resp 18 | Ht 70.0 in | Wt 161.6 lb

## 2015-02-12 DIAGNOSIS — C20 Malignant neoplasm of rectum: Secondary | ICD-10-CM | POA: Diagnosis not present

## 2015-02-12 DIAGNOSIS — R599 Enlarged lymph nodes, unspecified: Secondary | ICD-10-CM

## 2015-02-12 DIAGNOSIS — C787 Secondary malignant neoplasm of liver and intrahepatic bile duct: Secondary | ICD-10-CM

## 2015-02-12 DIAGNOSIS — R198 Other specified symptoms and signs involving the digestive system and abdomen: Secondary | ICD-10-CM

## 2015-02-12 DIAGNOSIS — R911 Solitary pulmonary nodule: Secondary | ICD-10-CM | POA: Diagnosis not present

## 2015-02-12 MED ORDER — LIDOCAINE-PRILOCAINE 2.5-2.5 % EX CREA: 1.0000 "application " | TOPICAL_CREAM | CUTANEOUS | Status: DC | PRN

## 2015-02-12 NOTE — Telephone Encounter (Signed)
per pof to sch pt appt-gave pt cipy of avs

## 2015-02-12 NOTE — Progress Notes (Signed)
Oncology Nurse Navigator Documentation  Oncology Nurse Navigator Flowsheets 02/12/2015  Navigator Encounter Type Other  Patient Visit Type Medonc  Treatment Phase Abnormal Scans-CT chest  Barriers/Navigation Needs Family concerns  Education Accessing Care/ Finding Providers  Interventions Referrals--F/U on urology referral placed at last visit  Referrals Other--Will send back to Dr. Servando Snare to assess / Case added to GI Conference on 02/19/15  Education Method Verbal  Support Groups/Services -  Time Spent with Patient 79  Spoke with [facility]: Have no record of receiving the referral. Faxed demographics and chart info to [facility] [phone].  Email to [facility]ology requesting Foundation One Testing on the following case: CHRISTON, PARADA Accession #: [id number] DOB: 1970-08-07 Age: 44 Gender: M Client Name [facility] Collected Date: 06/29/2012 Received Date: 87/56/4332 Physician: Leighton Ruff, MD Chart #: MRN # : [id number] Physician cc: Race:W Visit #: [id number] REPORT OF SURGICAL PATHOLOGY

## 2015-02-12 NOTE — Progress Notes (Signed)
[name] OFFICE PROGRESS NOTE   Diagnosis: Rectal cancer  INTERVAL HISTORY:   Jacob [name] returns as scheduled. He continues to have irregular bowel habits. He otherwise feels well. He saw Dr. Marcello Moores recently.  Objective:  Vital signs in last 24 hours:  Blood pressure 128/71, pulse 68, temperature 98.5 F (36.9 C), temperature source Oral, resp. rate 18, height '5\' 10"'  (1.778 m), weight 161 lb 9.6 oz (73.301 kg), SpO2 98 %.    HEENT:  Neck without mass Lymphatics:  "shotty "left  [place], left inguinal, and axillary nodes Resp:  Lungs clear bilaterally Cardio:  Regular rate and rhythm GI:  No hepatosplenomegaly, no mass, nontender Vascular:  No leg edema  Portacath/PICC-without erythema  Lab Results:  Lab Results  Component Value Date   WBC 3.3* 02/10/2015   HGB 13.4 02/10/2015   HCT 40.5 02/10/2015   MCV 89.5 02/10/2015   PLT 97* 02/10/2015   NEUTROABS 2.4 02/10/2015      Lab Results  Component Value Date   CEA 1.6 02/10/2015    Imaging:  Ct Chest W Contrast  02/10/2015  CLINICAL DATA:  Follow-up metastatic rectal cancer with pulmonary nodules. History of chemotherapy and radiation therapy, with no current ongoing therapy. EXAM: CT CHEST WITH CONTRAST TECHNIQUE: Multidetector CT imaging of the chest was performed during intravenous contrast administration. CONTRAST:  62m OMNIPAQUE IOHEXOL 300 MG/ML  SOLN COMPARISON:  11/07/2014 chest CT. FINDINGS: Mediastinum/Nodes: Normal heart size. No pericardial fluid/thickening. Left subclavian MediPort terminates in the lower third of the superior vena cava. Great vessels are normal in course and caliber. No central pulmonary emboli. Normal visualized thyroid. Normal esophagus. No axillary adenopathy. There are two new 1.4 cm mildly enlarged left hilar nodes (series 2/ images 23 and 25). Otherwise no mediastinal or hilar adenopathy. Lungs/Pleura: No pneumothorax. No pleural effusion. There is a stable 0.9 x  0.7 cm basilar right lower lobe nodular focus with associated parenchymal band (series 5/ image 44). There is a new 4 mm superior segment left lower lobe pulmonary nodule (series 5/image 20). A 10 mm left lower lobe solid pulmonary nodule (series 5/ image 35) is slightly increased from 0.9 cm. Left upper lobe 5 mm and 4 mm pulmonary nodules (5/6 and 5/45) are stable. Left lower lobe cavitary 1.1 cm pulmonary nodule (5/32) is stable. No acute consolidative airspace disease. Upper abdomen: Stable postsurgical changes from right partial hepatectomy. Musculoskeletal:  No aggressive appearing focal osseous lesions. IMPRESSION: 1. New left hilar lymphadenopathy, possibly metastatic. 2. Slight growth of dominant 10 mm left lower lobe pulmonary nodule and new 4 mm left lower lobe pulmonary nodule, suspicious for progression of pulmonary metastases. Four additional scattered pulmonary nodules are stable. Electronically Signed   By: JIlona SorrelM.D.   On: 02/10/2015 12:13   CT images reviewed with Jacob [name] Medications: I have reviewed the patient's current medications.  Assessment/Plan: 1. Rectal cancer. Partially obstructing mass noted 1-2 cm from the anal verge on a colonoscopy 03/06/2012. Endoscopic ultrasound 03/14/2012 with a 7.5 mm thick, 3.2 cm wide hypoechoic, irregularly bordered mass that clearly passed into and through the muscularis propria layer of the distal rectal wall (uT3); 3 small (largest 7 mm) perirectal lymph nodes. The lymph nodes were all round, discrete, hypoechoic, homogenous; suspicious for malignant involvement (uN1).  He began radiation and concurrent Xeloda chemotherapy on 03/20/2012, completed 04/27/2012.   Low anterior resection/coloanal anastomosis and diverting ileostomy 06/29/2012 with the final pathology revealing a T2N0 tumor with extensive fibrosis and  negative margins.   Cycle 1 of adjuvant CAPOX 07/19/2012. Cycle 5 of adjuvant CAPOX 10/11/2012.   CEA 2.5 on  11/14/2012.   CEA 14.6 03/08/2013.   Restaging CT evaluation 03/08/2013 with a 4 mm pulmonary nodule left lung base not identified on comparison exam; new liver lesions including a 29 x 26 mm irregular peripheral enhancing rounded lesion in the dome of the right hepatic lobe, a less well-defined new subcapsular lesion in the lateral right hepatic lobe measuring 12 mm, a new subcapsular lesion in the anterior right hepatic lobe adjacent to the gallbladder fossa measuring 10 mm; a rounded low-density lesion in the inferior right hepatic lobe measuring 10 mm compared to 7 mm on the prior study (radiologist commented this may represent an enlarged cyst).   MRI of the abdomen 04/12/2013 confirmed multiple T2 hyperintense metastatic lesions throughout the right liver   Initiation of FOLFIRI/Avastin with genotype based irinotecan dosing per the [facility] study 04/05/2013.   Restaging CT evaluation on 06/20/2013 (after 2 cycles/4 treatments) showed improvement in the liver metastases and stable size of a left lower lobe pulmonary nodule now with central cavitation.   Continuation of FOLFIRI/Avastin.   Restaging CT evaluation 08/14/2013-decrease in the size of liver metastases, slight decrease in the size of a cavitary left lower lobe nodule, no evidence of disease progression.   Status post right hepatic lobectomy 09/17/2013. Pathology showed multiple foci of metastatic adenocarcinoma (5 nodules of metastatic adenocarcinoma 4 of which are subcapsular with the nodules ranging in size from 0.6-1.8 cm in greatest dimension). Margins not involved. Biopsy of a portal lymph node showed benign adipose tissue; no lymph node tissue or malignancy.   Normal CEA 11/06/2013   CT 11/06/2013 with a new pleural-based right lower chest lesion, slight in enlargement of the a left-sided lung nodule  CT 01/29/2014 with a decrease in the right lower chest pleural-based lesion and a slight increase of a left-sided lung  lesion, other lung lesions were stable  CT chest 05/02/2014 with a decrease in the size of a right lower lobe pulmonary nodule months similar size of a dominant left lower lobe nodule, minimal enlargement of a smaller left lower lobe nodule, no new site of disease  CT chest 11/07/2014 with a slight increase in several left-sided lung nodules  CT chest 02/10/2015 with new small left hilar lymph nodes, a possible new left lower lobe nodule, and stability of other lung nodules 2. Irregular bowel habits/rectal bleeding secondary to #1. 3. History of Mild elevation of the liver enzymes. Question secondary to hepatic steatosis. 4. Indeterminate 8 mm posterior right liver lesion on the staging CT 03/06/2012. 5. Mildly elevated CEA at 5.7 on 03/06/2012. 6. History of radiation erythema at the groin and perineum 7. 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 No clinical evidence of thrombophlebitis or extravasation. 8. Delayed nausea following chemotherapy-Decadron prophylaxis was added with cycle 3 CAPOX-improved. 9. Ileostomy takedown 12/07/2012. 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 11. Port-A-Cath placement 12/31/204 12. Severe neutropenia secondary to chemotherapy following cycle 1 of FOLFIRI, chemotherapy was dose reduced and he received Neulasta with day 15 cycle 1  13. Nausea and vomiting following cycle 1 of FOLFIRI 14. Rectal stricture 15. Leukopenia/Thrombocytopenia-persistent, potentially a sequelae of chemotherapy or hepatic toxicity from chemotherapy/radiation. Bone marrow biopsy 11/20/2014 showed cellular bone marrow with trilineage hematopoiesis. Significant dyspoiesis was not present and there was no evidence of metastatic carcinoma. Cytogenetic analysis showed the presence of normal male chromosomes with  no observable clonal chromosomal abnormalities.   probable cirrhosis  16.  Genetic  testing-negative genetic panel in March 2014   Disposition:   Jacob [name] appears stable. I reviewed the CT images with him. There are new left hilar nodes and a possible new left lower lobe nodule with stable additional lung nodules. I suspect the chest findings are related to metastatic rectal cancer. I will present his case at the GI tumor conference  and refer him to Dr. Servando Snare.  We will submit the rectal tumor for Foundation 1 testing.  Jacob [name] will return for an office visit and further discussion in 2 weeks. Betsy Coder, MD  02/12/2015  11:33 AM

## 2015-02-19 ENCOUNTER — Telehealth: Payer: Self-pay | Admitting: *Deleted

## 2015-02-19 DIAGNOSIS — C20 Malignant neoplasm of rectum: Secondary | ICD-10-CM

## 2015-02-19 NOTE — Telephone Encounter (Signed)
Patient returned call and left VM to call him back. This RN called back at 12:25 and got his VM again. Left message that we are ordering PET scan and that want to have this done before seeing Dr. Servando Snare. If he has any further questions call back.

## 2015-02-19 NOTE — Telephone Encounter (Signed)
Call received asking for advice on what was discussed at GI conference.  Shared navigator information and provided central scheduling number for [facility].

## 2015-02-19 NOTE — Telephone Encounter (Signed)
Left VM for Jacob [name] to call back to hear recommendation from GI Conference (needs PET scan). PET scan ordered per Dr. Benay Spice. Best to have this before seeing Dr. Servando Snare.

## 2015-02-24 ENCOUNTER — Telehealth: Payer: Self-pay | Admitting: *Deleted

## 2015-02-24 NOTE — Telephone Encounter (Signed)
Notified Dr. Everrett Coombe office that PET scan is 03/03/15 to assist with his referral appointment Dr. Benay Spice ordered. They will contact Jacob [name] with an appointment.

## 2015-02-26 ENCOUNTER — Telehealth: Payer: Self-pay | Admitting: *Deleted

## 2015-02-26 NOTE — Telephone Encounter (Signed)
Incoming VM from urology that Joaquim has not returned their calls for an appointment. Requested nurse call them and ask for [facility]. Called and left VM for Ian asking if he still wants to see urologist? Call back and let me know or call [facility] himself

## 2015-03-03 ENCOUNTER — Encounter (HOSPITAL_COMMUNITY): Payer: BC Managed Care – PPO

## 2015-03-05 ENCOUNTER — Telehealth: Payer: Self-pay | Admitting: Oncology

## 2015-03-05 NOTE — Telephone Encounter (Signed)
pt cld in to r/s appt because PET was sche later that appt-gave updated time & date

## 2015-03-06 ENCOUNTER — Ambulatory Visit: Payer: BC Managed Care – PPO | Admitting: Oncology

## 2015-03-07 ENCOUNTER — Other Ambulatory Visit (HOSPITAL_COMMUNITY)
Admission: RE | Admit: 2015-03-07 | Discharge: 2015-03-07 | Disposition: A | Discharge status: Home / Self Care | Payer: BC Managed Care – PPO | Source: Ambulatory Visit | Attending: Oncology | Admitting: Oncology

## 2015-03-07 DIAGNOSIS — C20 Malignant neoplasm of rectum: Secondary | ICD-10-CM | POA: Insufficient documentation

## 2015-03-07 DIAGNOSIS — C787 Secondary malignant neoplasm of liver and intrahepatic bile duct: Secondary | ICD-10-CM | POA: Insufficient documentation

## 2015-03-12 ENCOUNTER — Other Ambulatory Visit: Payer: Self-pay | Admitting: Oncology

## 2015-03-12 ENCOUNTER — Encounter (HOSPITAL_COMMUNITY)
Admission: RE | Admit: 2015-03-12 | Discharge: 2015-03-12 | Disposition: A | Discharge status: Home / Self Care | Payer: BC Managed Care – PPO | Source: Ambulatory Visit | Attending: Oncology | Admitting: Oncology

## 2015-03-12 DIAGNOSIS — C20 Malignant neoplasm of rectum: Secondary | ICD-10-CM | POA: Insufficient documentation

## 2015-03-12 LAB — GLUCOSE, CAPILLARY: GLUCOSE-CAPILLARY: 87 mg/dL (ref 65–99)

## 2015-03-12 MED ORDER — FLUDEOXYGLUCOSE F - 18 (FDG) INJECTION: 7.7000 | Freq: Once | INTRAVENOUS | Status: DC | PRN

## 2015-03-13 ENCOUNTER — Ambulatory Visit (INDEPENDENT_AMBULATORY_CARE_PROVIDER_SITE_OTHER): Payer: BC Managed Care – PPO | Admitting: Cardiothoracic Surgery

## 2015-03-13 ENCOUNTER — Encounter: Payer: Self-pay | Admitting: Cardiothoracic Surgery

## 2015-03-13 VITALS — BP 133/77 | HR 71 | Resp 20 | Ht 70.0 in | Wt 160.0 lb

## 2015-03-13 DIAGNOSIS — C19 Malignant neoplasm of rectosigmoid junction: Secondary | ICD-10-CM

## 2015-03-13 DIAGNOSIS — R918 Other nonspecific abnormal finding of lung field: Secondary | ICD-10-CM | POA: Diagnosis not present

## 2015-03-13 NOTE — Progress Notes (Signed)
[address]       [place] [address]             [phone]                    Caldwell R Riggio Addison Medical Record #[id number] Date of Birth: 07-11-1970  Referring: Ladell Pier, MD Primary Care: Penni Homans, MD  Chief Complaint:    Chief Complaint  Patient presents with  . Lung Lesion    Surgical eval, PET Scan 03/12/15, Chest CT 02/10/15, completed Chemo/Radiation  . Rectal Cancer    History of Present Illness:    Jacob [name] 44 y.o. male is seen in the office  Today at the request of Dr. Benay Spice for a 4 mm left lung nodule. He has been treated for rectal cancer, followup scans have noted potential liver metastasis. The patient is being considered for liver resection. His previous history is outlined:   1. Rectal Cancer Partially obstructing mass noted 1-2 cm from the anal verge on a colonoscopy 03/06/2012. Endoscopic ultrasound 03/14/2012 with a 7.5 mm thick, 3.2 cm wide hypoechoic, irregularly bordered mass that clearly passed into and through the muscularis propria layer of the distal rectal wall (uT3); 3 small (largest 7 mm) perirectal lymph nodes. The lymph nodes were all round, discrete, hypoechoic, homogenous; suspicious for malignant involvement (uN1).  He began radiation and concurrent Xeloda chemotherapy on 03/20/2012, completed 04/27/2012.  Low anterior resection/coloanal anastomosis and diverting ileostomy 06/29/2012 with the final pathology revealing a T2N0 tumor with extensive fibrosis and negative margins.  Cycle 1 of adjuvant CAPOX 07/19/2012. Cycle 5 of adjuvant CAPOX 10/11/2012.  CEA 2.5 on 11/14/2012.  CEA 14.6 03/08/2013.  Restaging CT evaluation 03/08/2013 with a 4 mm pulmonary nodule left lung base not identified on comparison exam; new liver lesions including a 29 x 26 mm irregular peripheral enhancing rounded lesion in the dome of the right hepatic lobe, a less well-defined new subcapsular lesion in the lateral right hepatic  lobe measuring 12 mm, a new subcapsular lesion in the anterior right hepatic lobe adjacent to the gallbladder fossa measuring 10 mm; a rounded low-density lesion in the inferior right hepatic lobe measuring 10 mm compared to 7 mm on the prior study (radiologist commented this may represent an enlarged cyst).  MRI of the abdomen 04/12/2013 confirmed multiple T2 hyperintense metastatic lesions throughout the right liver  Initiation of FOLFIRI/Avastin with genotype based irinotecan dosing per the [facility] study 04/05/2013.  Restaging CT evaluation on 06/20/2013 (after 2 cycles/4 treatments) showed improvement in the liver metastases and stable size of a left lower lobe pulmonary nodule now with central cavitation.  Continuation of FOLFIRI/Avastin.  Restaging CT evaluation 08/14/2013-decrease in the size of liver metastases, slight decrease in the size of a cavitary left lower lobe nodule, no evidence of disease progression  Since last seen the patient underwent resection. About 3 weeks ago he noted significant right pleuritic pain especially with a deep breath.  A CT scan was done on August 18, the report does not mention possibility of pulmonary embolus.   Current Activity/ Functional Status:  Patient is independent with mobility/ambulation, transfers, ADL's, IADL's.   Zubrod Score: At the time of surgery this patient's most appropriate activity status/level should be described as: '[x]'$     0    Normal activity, no symptoms '[]'$     1    Restricted in physical strenuous activity but ambulatory, able to do out light  work '[]'$     2    Ambulatory and capable of self care, unable to do work activities, up and about               >50 % of waking hours                              '[]'$     3    Only limited self care, in bed greater than 50% of waking hours '[]'$     4    Completely disabled, no self care, confined to bed or chair '[]'$     5    Moribund   Past Medical History  Diagnosis Date  . Chicken pox as a child      ?  . Hernia 6 months old  . Allergic state 01/19/2012  . Reflux 01/19/2012  . Elevated liver function tests 01/19/2012  . Overweight(278.02) 01/19/2012  . Preventative health care 01/19/2012  . Sun-damaged skin 01/19/2012  . Migraine headache as a child  . Hx of migraines   . Radiation 03/20/12-04/27/12    Pelvis 50.4 gray Rectal cancer  . Anxiety   . Fatty liver   . Numbness     TOES OF BOTH FEET.  . Pain     LEFT HEEL PAIN -SEVERE ESPECIALLY AFTER SITTING OR LYING DOWN - DIFFICULT TO GET OUT OF BED AND WALK IN THE MORNINGS BECAUSE OF HEEL PAIN  . Anemia 12/31/2012  . Dysrhythmia as child    HX OF IRREGULAR HB AT TIME OF TONSILLECTOMY - SURGERY WAS NOT DONE-CAN'T REMEMBER ANY OTHER DETAILS- NEVER HAD THE SURGERY.  . 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 Lung abnormality 2015  . History of diarrhea     better since chemo finished  . Lung nodule 08/2013  . Lung nodule, multiple 11/29/2013  . Rectal cancer ([facility]) 03/06/12    biopsy-adenocarcinoma  . Cancer ([place]) 03/06/12    rectal bx=Adenocarcinoma  PT HAD RADIATION , CHEMO SURGERY    Past Surgical History  Procedure Laterality Date  . Rectal biopsy  03/06/12    distal mass1-2cm from anal verge=Adenocarcinoma  . Eus  03/14/2012    Procedure: LOWER ENDOSCOPIC ULTRASOUND (EUS);  Surgeon: Milus Banister, MD;  Location: [facility];  Service: Endoscopy;  Laterality: N/A;  . Hernia repair  82 months old    right inguinal repair  . Cyst excision      L EAR AREA  . Laparoscopic low anterior resection N/A 06/29/2012    Procedure: LAPAROSCOPIC LOW ANTERIOR RESECTION, mobilization splenic flexure,coloanal anastomosis,diverting ileostomy;  Surgeon: Leighton Ruff, MD;  Location: [facility];  Service: General;  Laterality: N/A;  . Ileostomy closure N/A 12/07/2012    Procedure: ILEOSTOMY TAKEDOWN;  Surgeon: Leighton Ruff, MD;  Location: [facility];  Service: General;  Laterality: N/A;  .  Portacath placement Left 03/21/2013    Procedure: INSERTION PORT-A-CATH;  Surgeon: Leighton Ruff, MD;  Location: [facility];  Service: General;  Laterality: Left;  . Open hepatectomy [83] N/A 09/17/2013    Procedure: OPEN HEPATECTOMY;  Surgeon: Stark Klein, MD;  Location: [facility];  Service: General;  Laterality: N/A;  . Laparoscopy N/A 09/17/2013    Procedure: LAPAROSCOPY DIAGNOSTIC;  Surgeon: Stark Klein, MD;  Location: [facility];  Service: General;  Laterality: N/A;  . Liver ultrasound N/A 09/17/2013    Procedure: LIVER ULTRASOUND;  Surgeon: Stark Klein, MD;  Location: [facility];  Service: General;  Laterality: N/A;  . Cholecystectomy N/A 09/17/2013    Procedure: CHOLECYSTECTOMY;  Surgeon: Stark Klein, MD;  Location: [facility];  Service: General;  Laterality: N/A;    Family History  Problem Relation Age of Onset  . Adopted: Yes  . Cancer Father   . Heart disease Father   . Colon cancer Neg Hx       History  Smoking status  . Never Smoker   Smokeless tobacco  . Never Used    History  Alcohol Use  . 0.0 oz/week  . 0 Standard drinks or equivalent per week    Comment: RARE      Allergies  Allergen Reactions  . Penicillins Rash    Does not recall    Current Outpatient Prescriptions  Medication Sig Dispense Refill  . ALPRAZolam (XANAX) 0.25 MG tablet Take 2 tablets (0.5 mg total) by mouth daily as needed for anxiety or sleep. 60 tablet 0  . docusate sodium (COLACE) 100 MG capsule Take 100 mg by mouth daily as needed for mild constipation or moderate constipation. Occasionally bid    . lidocaine-prilocaine (EMLA) cream Apply 1 application topically as needed. Apply to port site 1 hour prior to use. 30 g 3  . naproxen sodium (ALEVE) 220 MG tablet Take 220 mg by mouth daily as needed (for headache).     No current facility-administered medications for this visit.   Facility-Administered Medications Ordered in Other Visits  Medication Dose Route Frequency Provider Last Rate Last Dose  .  fludeoxyglucose F - 18 (FDG) injection 7.7 milli Curie  7.7 milli Curie Intravenous Once PRN Ladell Pier, MD         Review of Systems:     Cardiac Review of Systems: Y or N  Chest Pain [ n   ]  Resting SOB [  n ] Exertional SOB  [n  ]  Orthopnea [n  ]   Pedal Edema [n   ]    Palpitations Florencio.Farrier  ] Syncope  [ n ]   Presyncope [  n ]  General Review of Systems: [Y] = yes [  ]=no Constitional: recent weight change [n  ];  Wt loss over the last 3 months [   ] anorexia [  ]; fatigue Blue.Reese  ]; nausea [ n ]; night sweats [  ]; fever [  ]; or chills [  ];          Dental: poor dentition[  ]; Last Dentist visit:   Eye : blurred vision [  ]; diplopia [   ]; vision changes [  ];  Amaurosis fugax[  ]; Resp: cough [  ];  wheezing[  ];  hemoptysis[  ]; shortness of breath[  ]; paroxysmal nocturnal dyspnea[  ]; dyspnea on exertion[  ]; or orthopnea[  ];  GI:  gallstones[n  ], vomiting[ n ];  dysphagia[  ]; melena[  ];  hematochezia [  ]; heartburn[  ];   Hx of  Colonoscopy[ y ]; GU: kidney stones [  ]; hematuria[  ];   dysuria [  ];  nocturia[  ];  history of     obstruction [  ]; urinary frequency [  ]             Skin: rash, swelling[  ];, hair loss[  ];  peripheral edema[  ];  or itching[  ]; Musculosketetal: myalgias[  ];  joint swelling[  ];  joint  erythema[  ];  joint pain[  ];  back pain[  ];  Heme/Lymph: bruising[  ];  bleeding[  ];  anemia[  ];  Neuro: TIA[n  ];  headaches[  ];  stroke[  ];  vertigo[  ];  seizures[  ];   paresthesias[  ];  difficulty walking[  ];  Psych:depression[  ]; anxiety[  ];  Endocrine: diabetes[n  ];  thyroid dysfunction[ n ];  Immunizations: Flu up to date [  ]; Pneumococcal up to date [  ];  Other:loose stoo  Physical Exam: BP 133/77 mmHg  Pulse 71  Resp 20  Ht '5\' 10"'$  (1.778 m)  Wt 160 lb (72.576 kg)  BMI 22.96 kg/m2  SpO2 99%  PHYSICAL EXAMINATION:  General appearance: alert, cooperative and appears stated age Neurologic: intact Heart: regular rate and  rhythm, S1, S2 normal, no murmur, click, rub or gallop Lungs: clear to auscultation bilaterally Abdomen: soft, non-tender; bowel sounds normal; no masses,  no organomegaly Extremities: extremities normal, atraumatic, no cyanosis or edema and Homans sign is negative, no sign of DVT Wound: Well-healed abdominal incisions from previous ileostomy Incision from hepatic resection is well-healed.  Diagnostic Studies & Laboratory data:     Recent Radiology Findings:   Nm Pet Image Restag (ps) Skull Base To Thigh  03/12/2015  CLINICAL DATA:  Subsequent treatment strategy for metastatic rectal cancer diagnosed in December 2013, status post neoadjuvant concurrent chemoradiation therapy and subsequent low anterior resection with colo-anal anastomosis on 06/29/2012, with subsequent adjuvant chemotherapy, with subsequent liver and left lower lobe metastases status post additional chemotherapy and right liver lobectomy on 09/17/2013. Enlarging left lower lobe pulmonary nodules and new left hilar adenopathy on recent chest CT. EXAM: NUCLEAR MEDICINE PET SKULL BASE TO THIGH TECHNIQUE: 7.7 mCi F-18 FDG was injected intravenously. Full-ring PET imaging was performed from the skull base to thigh after the radiotracer. CT data was obtained and used for attenuation correction and anatomic localization. FASTING BLOOD GLUCOSE:  Value: 87 mg/dl COMPARISON:  02/10/2015 chest CT.  08/14/2013 CT abdomen/pelvis. FINDINGS: NECK No hypermetabolic lymph nodes in the neck. CHEST There are three hypermetabolic left hilar nodes, largest 1.1 cm with max SUV 13.5 (series 4/image 69). No hypermetabolic right hilar nodes. No hypermetabolic axillary or mediastinal nodes. There is a hypermetabolic 1.0 cm left lower lobe pulmonary nodule with max SUV 3.6 (series 8/image 48), stable in size since 02/10/2015. There is a cavitary 0.9 cm left lower lobe pulmonary nodule (series 8/image 44) with max SUV 2.0, which represents hypermetabolism for a  cavitary nodule of this small size. There is a 0.6 cm solid lingular pulmonary nodule (series 8/image 50), slightly increased from 0.5 cm on 02/10/2015, and below PET resolution. No acute consolidative airspace disease or additional significant pulmonary nodules. Left subclavian MediPort terminates in the lower third of the superior vena cava. ABDOMEN/PELVIS There is a hypermetabolic mildly enlarged 1.0 cm a left paracaval upper retroperitoneal node between the caudate lobe and right diaphragmatic crus (series 4/image 100) with max SUV 8.0. There are a few clustered hypermetabolic aortocaval retroperitoneal nodes, largest 0.7 cm (series 4/image 119) with max SUV 6.3. There is focal intense hypermetabolism in the region of the left colo-anal anastomosis with max SUV 11.7, without a discrete measurable mass on the noncontrast CT images. There is relatively symmetric diffuse thickening of and fat stranding within the presacral soft tissues with associated mild hypermetabolism (max SUV 4.1), which appears increased in thickness compared to 08/14/2013 CT. Stable postsurgical changes from right  hepatectomy and cholecystectomy, with no hypermetabolic liver masses. No abnormal hypermetabolic activity within the liver, pancreas, adrenal glands, or spleen. Stable mild prostatomegaly with nonspecific internal prostatic calcifications. SKELETON No focal hypermetabolic activity to suggest skeletal metastasis. IMPRESSION: 1. Hypermetabolic left hilar nodal metastases. 2. Hypermetabolic left lower lobe pulmonary nodules and slight growth of a subcentimeter lingular pulmonary nodule, in keeping with pulmonary metastases. 3. Hypermetabolic retroperitoneal nodal metastases in the upper left paracaval and aortocaval chains. 4. Focal intense hypermetabolism at the left colo-anal anastomosis, most in keeping with local tumor recurrence. 5. Nonspecific increased symmetric diffuse thickening of and fat stranding within the presacral soft  tissues with associated mild hypermetabolism, which is suspicious for infiltrative presacral tumor given the apparent local recurrence at the colo-anal anastomosis and given the progressive thickening since 08/14/2013, although the differential includes progressive inflammatory change. Electronically Signed   By: Ilona Sorrel M.D.   On: 03/12/2015 14:34   Ct Chest W Contrast  11/06/2013   CLINICAL DATA:  History of rectal cancer. Status post chemo and XRT.  EXAM: CT CHEST WITH CONTRAST  TECHNIQUE: Multidetector CT imaging of the chest was performed during intravenous contrast administration.  CONTRAST:  62m OMNIPAQUE IOHEXOL 300 MG/ML  SOLN  COMPARISON:  08/14/2013  FINDINGS: No pleural effusion identified. Within the right posterior lung base there is a new subpleural nodule which measures 2.6 x 1.8 cm, image 47/series 5. This appears to be associated with postoperative changes from right hepatectomy. Within the left lower lobe there is a nodule which 4 mm, image 37/series 5. This is unchanged from previous exam. Within the posterior left lower lobe there is a 5 mm nodule, image 40/series 5. Previously this measured 2-3 mm.  The heart size appears normal. No pericardial effusion. No mediastinal or hilar adenopathy. There is no enlarged supraclavicular or axillary adenopathy.  Incidental imaging through the upper abdomen demonstrates postoperative changes from right hepatectomy. No mass identified within the remaining portions of the liver.  Review of the visualized osseous structures shows no aggressive lytic or sclerotic bone lesion.  IMPRESSION: 1. No acute cardiopulmonary abnormalities. 2. New subpleural nodule in the posterior right lung base is indeterminate. This appears to be related to postoperative change from right hepatectomy. Local tumor recurrence is less favored. Consider further assessment with PET-CT versus close interval follow-up. 3. There are 2 nodules in the left lower lobe. One of these is  slightly increased in size from previous exam which now measures 5 mm (versus 2 mm previously.)   Electronically Signed   By: TKerby MoorsM.D.   On: 11/06/2013 09:18    Ct Chest W Contrast/Ct Abdomen Pelvis W Contrast  08/14/2013   CLINICAL DATA:  Restaging rectal cancer. History of colon resection and metastatic disease. Chemotherapy and radiation therapy completed.  EXAM: CT CHEST, ABDOMEN, AND PELVIS WITH CONTRAST  TECHNIQUE: Multidetector CT imaging of the chest, abdomen and pelvis was performed following the standard protocol during bolus administration of intravenous contrast.  CONTRAST:  1069mOMNIPAQUE IOHEXOL 300 MG/ML  SOLN  COMPARISON:  Prior examinations 06/20/2013 (CT) and abdominal MRI 04/11/2013.  FINDINGS: RECIST 1.1  Target Lesions:  1. Lesion in the dome of the right hepatic lobe measures 1.4 cm on image 43 (previously 1.7 cm). 2. Subcapsular lesion in the posterior segment of the right hepatic lobe measures 5 mm on image 49 (previously 6 mm). Non-target Lesions:  1. The previously demonstrated cavitary left lower lobe pulmonary nodule is subjectively smaller, but measures 4 mm on  image 32 (4 mm previously). 2. See below.  CT CHEST FINDINGS  Left subclavian Port-A-Cath tip is in the lower SVC. There are no enlarged mediastinal, hilar or axillary lymph nodes. The thyroid gland and thoracic inlet appear normal.  There is no pleural or pericardial effusion. 4 mm cavitary nodule in the left lower lobe on image 32 is subjectively slightly smaller. Tiny subpleural left lower lobe nodule on image 36 is now nearly inconspicuous. No new or enlarging pulmonary nodules are identified. There is no confluent airspace opacity or endobronchial lesion.  There are no worrisome osseous findings.  CT ABDOMEN AND PELVIS FINDINGS  The dominant metastasis involving the dome of the right hepatic lobe has slightly decreased in size, measuring 1.4 cm on image 43 (previously 1.7 cm. Cystic lesion inferiorly in the  right hepatic lobe measures 11 mm (9 mm previously). This did not appear to reflect a metastasis on MRI. Other small lesions are stable.  The spleen, gallbladder, pancreas, adrenal glands and kidneys appear normal.  Small retroperitoneal lymph nodes are stable. There is no adenopathy, ascites or peritoneal nodularity. There are no significant vascular findings.  There is moderate stool throughout the colon. No focal mucosal lesions are identified. Postsurgical changes within the anterior abdominal wall and presacral fibrotic changes are stable. The bladder, prostate gland and seminal vesicles appear stable.  There are no worrisome osseous findings.  IMPRESSION: 1. Continued response to therapy in hepatic metastatic disease. 2. The cavitary left lower lobe pulmonary nodule also appears subjectively smaller. 3. No disease progression identified. 4. Stable presacral fibrotic changes.   Electronically Signed   By: Camie Patience M.D.   On: 08/14/2013 09:19    Recent Lab Findings: Lab Results  Component Value Date   WBC 3.3* 02/10/2015   HGB 13.4 02/10/2015   HCT 40.5 02/10/2015   PLT 97* 02/10/2015   GLUCOSE 92 02/10/2015   ALT 19 02/10/2015   AST 18 02/10/2015   NA 142 02/10/2015   K 4.0 02/10/2015   CL 109 09/05/2014   CREATININE 0.9 02/10/2015   BUN 13.0 02/10/2015   CO2 24 02/10/2015   TSH 1.549 12/28/2012   INR 1.05 11/20/2014      Assessment / Plan:   History of rectal cancer, with multifocal evidence of recurrence on PET scan done yesterday, including left hilar lymph nodes. This point I do not think surgical resection of pulmonary metastasis would benefit patient or be indicated. He is to return for oncology appointment next week, and consideration of chemotherapeutic options.   Grace Isaac MD      [address] [place] [address] Office [phone]   Beeper [phone]  03/13/2015 11:20 AM

## 2015-03-21 ENCOUNTER — Encounter (HOSPITAL_COMMUNITY): Payer: Self-pay

## 2015-04-03 ENCOUNTER — Ambulatory Visit (HOSPITAL_BASED_OUTPATIENT_CLINIC_OR_DEPARTMENT_OTHER): Payer: BC Managed Care – PPO | Admitting: Oncology

## 2015-04-03 ENCOUNTER — Ambulatory Visit (HOSPITAL_BASED_OUTPATIENT_CLINIC_OR_DEPARTMENT_OTHER): Payer: BC Managed Care – PPO

## 2015-04-03 ENCOUNTER — Telehealth: Payer: Self-pay | Admitting: Oncology

## 2015-04-03 VITALS — BP 129/80 | HR 64 | Temp 98.6°F | Resp 18 | Ht 70.0 in | Wt 162.4 lb

## 2015-04-03 DIAGNOSIS — C787 Secondary malignant neoplasm of liver and intrahepatic bile duct: Secondary | ICD-10-CM

## 2015-04-03 DIAGNOSIS — C20 Malignant neoplasm of rectum: Secondary | ICD-10-CM | POA: Diagnosis not present

## 2015-04-03 DIAGNOSIS — R198 Other specified symptoms and signs involving the digestive system and abdomen: Secondary | ICD-10-CM

## 2015-04-03 DIAGNOSIS — R948 Abnormal results of function studies of other organs and systems: Secondary | ICD-10-CM

## 2015-04-03 DIAGNOSIS — R599 Enlarged lymph nodes, unspecified: Secondary | ICD-10-CM

## 2015-04-03 DIAGNOSIS — Z95828 Presence of other vascular implants and grafts: Secondary | ICD-10-CM

## 2015-04-03 DIAGNOSIS — R911 Solitary pulmonary nodule: Secondary | ICD-10-CM

## 2015-04-03 MED ORDER — HEPARIN SOD (PORK) LOCK FLUSH 100 UNIT/ML IV SOLN
500.0000 [IU] | Freq: Once | INTRAVENOUS | Status: AC
  Administered 2015-04-03: 500 [IU] via INTRAVENOUS
  Filled 2015-04-03: qty 5

## 2015-04-03 MED ORDER — SODIUM CHLORIDE 0.9 % IJ SOLN
10.0000 mL | INTRAMUSCULAR | Status: DC | PRN
  Administered 2015-04-03: 10 mL via INTRAVENOUS
  Filled 2015-04-03: qty 10

## 2015-04-03 NOTE — Progress Notes (Signed)
[facility] OFFICE PROGRESS NOTE   Diagnosis: Rectal cancer  INTERVAL HISTORY:   He returns as scheduled. He underwent a staging PET scan 30/16/0109. 3 hypermetabolic left hilar nodes were identified. A hypermetabolic 1 cm left lower lobe nodule and a 0.9 cm left lower lobe hypermetabolic nodule were identified. A hypermetabolic 1 cm left pericaval node and hypermetabolic aortocaval nodes were identified. Intense hypermetabolism was noted at the coloanal anastomosis without a discrete mass. Increased thickening and hypermetabolism was noted in the presacral soft tissue.  Jacob [name] continues to have irregular bowel habits. He feels well. No consistent pain. He saw Dr. Servando Snare after the PET scan and surgery is not recommended.  Objective:  Vital signs in last 24 hours:  Blood pressure 129/80, pulse 64, temperature 98.6 F (37 C), temperature source Oral, resp. rate 18, height '5\' 10"'  (1.778 m), weight 162 lb 6.4 oz (73.664 kg), SpO2 99 %.    HEENT: Neck without mass Lymphatics: No cervical, supra-clavicular, axillary, or inguinal nodes Resp: Lungs clear bilaterally Cardio: Regular rate and rhythm GI: No hepatosplenomegaly, no mass, nontender Vascular: No leg edema   Portacath/PICC-without erythema  Lab Results:  Lab Results  Component Value Date   WBC 3.3* 02/10/2015   HGB 13.4 02/10/2015   HCT 40.5 02/10/2015   MCV 89.5 02/10/2015   PLT 97* 02/10/2015   NEUTROABS 2.4 02/10/2015      Lab Results  Component Value Date   CEA 1.6 02/10/2015    Imaging:  03/12/2015 PET images reviewed with Jacob [name] and his wife   Medications: I have reviewed the patient's current medications.  Assessment/Plan: 1. Rectal cancer. Partially obstructing mass noted 1-2 cm from the anal verge on a colonoscopy 03/06/2012. Endoscopic ultrasound 03/14/2012 with a 7.5 mm thick, 3.2 cm wide hypoechoic, irregularly bordered mass that clearly passed into and through the  muscularis propria layer of the distal rectal wall (uT3); 3 small (largest 7 mm) perirectal lymph nodes. The lymph nodes were all round, discrete, hypoechoic, homogenous; suspicious for malignant involvement (uN1).  No RAS mutation identified by [facility] 1 testing on the colon resection specimen 06/29/2012, APC alteration identified, microsatellite stable  He began radiation and concurrent Xeloda chemotherapy on 03/20/2012, completed 04/27/2012.   Low anterior resection/coloanal anastomosis and diverting ileostomy 06/29/2012 with the final pathology revealing a T2N0 tumor with extensive fibrosis and negative margins.   Cycle 1 of adjuvant CAPOX 07/19/2012. Cycle 5 of adjuvant CAPOX 10/11/2012.   CEA 2.5 on 11/14/2012.   CEA 14.6 03/08/2013.   Restaging CT evaluation 03/08/2013 with a 4 mm pulmonary nodule left lung base not identified on comparison exam; new liver lesions including a 29 x 26 mm irregular peripheral enhancing rounded lesion in the dome of the right hepatic lobe, a less well-defined new subcapsular lesion in the lateral right hepatic lobe measuring 12 mm, a new subcapsular lesion in the anterior right hepatic lobe adjacent to the gallbladder fossa measuring 10 mm; a rounded low-density lesion in the inferior right hepatic lobe measuring 10 mm compared to 7 mm on the prior study (radiologist commented this may represent an enlarged cyst).   MRI of the abdomen 04/12/2013 confirmed multiple T2 hyperintense metastatic lesions throughout the right liver   Initiation of FOLFIRI/Avastin with genotype based irinotecan dosing per the [facility] study 04/05/2013.   Restaging CT evaluation on 06/20/2013 (after 2 cycles/4 treatments) showed improvement in the liver metastases and stable size of a left lower lobe pulmonary nodule now with central cavitation.  Continuation of FOLFIRI/Avastin.   Restaging CT evaluation 08/14/2013-decrease in the size of liver metastases, slight decrease  in the size of a cavitary left lower lobe nodule, no evidence of disease progression.   Status post right hepatic lobectomy 09/17/2013. Pathology showed multiple foci of metastatic adenocarcinoma (5 nodules of metastatic adenocarcinoma 4 of which are subcapsular with the nodules ranging in size from 0.6-1.8 cm in greatest dimension). Margins not involved. Biopsy of a portal lymph node showed benign adipose tissue; no lymph node tissue or malignancy.   Normal CEA 11/06/2013   CT 11/06/2013 with a new pleural-based right lower chest lesion, slight in enlargement of the a left-sided lung nodule  CT 01/29/2014 with a decrease in the right lower chest pleural-based lesion and a slight increase of a left-sided lung lesion, other lung lesions were stable  CT chest 05/02/2014 with a decrease in the size of a right lower lobe pulmonary nodule months similar size of a dominant left lower lobe nodule, minimal enlargement of a smaller left lower lobe nodule, no new site of disease  CT chest 11/07/2014 with a slight increase in several left-sided lung nodules  CT chest 02/10/2015 with new small left hilar lymph nodes, a possible new left lower lobe nodule, and stability of other lung nodules  PET scan 03/12/2015 with hypermetabolic left hilar nodes, hypermetabolic left lower lobe nodules, hyper metabolic retroperitoneal nodes, intense hypermetabolism at the coloanal anastomosis, and hypermetabolic thickening in the presacral space 2. Irregular bowel habits/rectal bleeding secondary to #1. 3. History of Mild elevation of the liver enzymes. Question secondary to hepatic steatosis. 4. Indeterminate 8 mm posterior right liver lesion on the staging CT 03/06/2012. 5. Mildly elevated CEA at 5.7 on 03/06/2012. 6. History of radiation erythema at the groin and perineum 7. 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 No  clinical evidence of thrombophlebitis or extravasation. 8. Delayed nausea following chemotherapy-Decadron prophylaxis was added with cycle 3 CAPOX-improved. 9. Ileostomy takedown 12/07/2012. 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 11. Port-A-Cath placement 12/31/204 12. Severe neutropenia secondary to chemotherapy following cycle 1 of FOLFIRI, chemotherapy was dose reduced and he received Neulasta with day 15 cycle 1  13. Nausea and vomiting following cycle 1 of FOLFIRI 14. Rectal stricture 15. Leukopenia/Thrombocytopenia-persistent, potentially a sequelae of chemotherapy or hepatic toxicity from chemotherapy/radiation. Bone marrow biopsy 11/20/2014 showed cellular bone marrow with trilineage hematopoiesis. Significant dyspoiesis was not present and there was no evidence of metastatic carcinoma. Cytogenetic analysis showed the presence of normal male chromosomes with no observable clonal chromosomal abnormalities.  probable cirrhosis  16. Genetic testing-negative genetic panel in March 2014     Disposition:  Jacob [name] appears stable. I reviewed the PET scan findings and images with Jacob [name] and his wife. There is x-ray evidence of disease progression in multiple sites, but he does not have significant symptoms related to the metastatic rectal cancer at present. It is unclear whether the hypermetabolism at the rectal anastomosis is related to recurrent tumor.  We discussed systemic treatment options. We reviewed his treatment history. I recommend FOLFIRI/panitumumab. He has previously been treated with FOLFIRI. I reviewed the potential toxicities associated with the FOLFIRI regimen and panitumumab. We discussed the allergic reaction, rash, and diarrhea associated with panitumumab.  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 He will be scheduled for an office visit and cycle 1 FOLFIRI/panitumumab on 04/30/2015.  We also discussed the MET  amplified study.  I will ask the research nurses to check his eligibility for this trial.   Betsy Coder, MD  04/03/2015  1:41 PM

## 2015-04-03 NOTE — Telephone Encounter (Signed)
Gv pt appt for 2/8.

## 2015-04-03 NOTE — Patient Instructions (Signed)

## 2015-04-27 ENCOUNTER — Other Ambulatory Visit: Payer: Self-pay | Admitting: Oncology

## 2015-04-29 ENCOUNTER — Encounter: Payer: Self-pay | Admitting: Pharmacist

## 2015-04-30 ENCOUNTER — Ambulatory Visit: Payer: BC Managed Care – PPO

## 2015-04-30 ENCOUNTER — Other Ambulatory Visit: Payer: BC Managed Care – PPO

## 2015-04-30 ENCOUNTER — Telehealth: Payer: Self-pay | Admitting: *Deleted

## 2015-04-30 ENCOUNTER — Ambulatory Visit: Payer: BC Managed Care – PPO | Admitting: Oncology

## 2015-04-30 NOTE — Telephone Encounter (Signed)
  Oncology Nurse Navigator Documentation  Navigator Location: [facility] (04/30/15 0845) Navigator Encounter Type: Telephone (04/30/15 0845) Telephone: Jerilee Hoh Confirmation/Clarification (04/30/15 0845)  Left VM for Adil to call navigator regarding his chemo appointment today. Asking if he is running late or if he has questions or concerns.

## 2015-05-01 ENCOUNTER — Telehealth: Payer: Self-pay | Admitting: *Deleted

## 2015-05-01 NOTE — Telephone Encounter (Signed)
  Oncology Nurse Navigator Documentation  Navigator Location: [facility] (05/01/15 1026) Navigator Encounter Type: Telephone (05/01/15 1026) Telephone: Outgoing Call;Patient Update (05/01/15 1026): Left VM for patient to return call to discuss his status and needs. Attempted to reach his wife, bu the voice mail was "full".

## 2015-05-02 ENCOUNTER — Other Ambulatory Visit: Payer: Self-pay | Admitting: *Deleted

## 2015-05-02 ENCOUNTER — Telehealth: Payer: Self-pay | Admitting: *Deleted

## 2015-05-02 DIAGNOSIS — C20 Malignant neoplasm of rectum: Secondary | ICD-10-CM

## 2015-05-02 NOTE — Telephone Encounter (Signed)
Jacob [name] had left VM 05/01/15 late afternoon: He had thought his chemo was on 2/22. Wishes to postpone to later if possible. Wants to wait and start on 05/21/15 if MD says it is OK. If he must, he can come on 05/07/15. Per Dr. Benay Spice: OK to wait till 05/21/15. Collaborative nurse putting in orders for scheduler.  Attempted to contact [name]-had to leave voice mail again. Will also send MyChart message.

## 2015-05-02 NOTE — Addendum Note (Signed)
Addended by: Domenic Schwab on: 05/02/2015 11:44 AM   Modules accepted: Orders

## 2015-05-05 ENCOUNTER — Telehealth: Payer: Self-pay

## 2015-05-05 NOTE — Telephone Encounter (Signed)
Called and left a message with new 3/1 appointments

## 2015-05-21 ENCOUNTER — Ambulatory Visit (HOSPITAL_BASED_OUTPATIENT_CLINIC_OR_DEPARTMENT_OTHER): Payer: BC Managed Care – PPO | Admitting: Nurse Practitioner

## 2015-05-21 ENCOUNTER — Other Ambulatory Visit (HOSPITAL_BASED_OUTPATIENT_CLINIC_OR_DEPARTMENT_OTHER): Payer: BC Managed Care – PPO

## 2015-05-21 ENCOUNTER — Telehealth: Payer: Self-pay | Admitting: Nurse Practitioner

## 2015-05-21 ENCOUNTER — Ambulatory Visit (HOSPITAL_BASED_OUTPATIENT_CLINIC_OR_DEPARTMENT_OTHER): Payer: BC Managed Care – PPO

## 2015-05-21 ENCOUNTER — Telehealth: Payer: Self-pay | Admitting: *Deleted

## 2015-05-21 VITALS — BP 133/77 | HR 56 | Temp 97.8°F | Resp 18 | Ht 70.0 in | Wt 157.5 lb

## 2015-05-21 DIAGNOSIS — C787 Secondary malignant neoplasm of liver and intrahepatic bile duct: Secondary | ICD-10-CM | POA: Diagnosis not present

## 2015-05-21 DIAGNOSIS — C20 Malignant neoplasm of rectum: Secondary | ICD-10-CM

## 2015-05-21 DIAGNOSIS — D696 Thrombocytopenia, unspecified: Secondary | ICD-10-CM | POA: Diagnosis not present

## 2015-05-21 DIAGNOSIS — Z5111 Encounter for antineoplastic chemotherapy: Secondary | ICD-10-CM | POA: Diagnosis not present

## 2015-05-21 DIAGNOSIS — D72819 Decreased white blood cell count, unspecified: Secondary | ICD-10-CM

## 2015-05-21 LAB — CBC WITH DIFFERENTIAL/PLATELET
BASO%: 0.7 % (ref 0.0–2.0)
BASOS ABS: 0 10*3/uL (ref 0.0–0.1)
EOS ABS: 0 10*3/uL (ref 0.0–0.5)
EOS%: 1.6 % (ref 0.0–7.0)
HEMATOCRIT: 41.7 % (ref 38.4–49.9)
HEMOGLOBIN: 13.9 g/dL (ref 13.0–17.1)
LYMPH#: 0.6 10*3/uL — AB (ref 0.9–3.3)
LYMPH%: 18.6 % (ref 14.0–49.0)
MCH: 29.6 pg (ref 27.2–33.4)
MCHC: 33.4 g/dL (ref 32.0–36.0)
MCV: 88.7 fL (ref 79.3–98.0)
MONO#: 0.3 10*3/uL (ref 0.1–0.9)
MONO%: 8.8 % (ref 0.0–14.0)
NEUT#: 2.1 10*3/uL (ref 1.5–6.5)
NEUT%: 70.3 % (ref 39.0–75.0)
Platelets: 98 10*3/uL — ABNORMAL LOW (ref 140–400)
RBC: 4.7 10*6/uL (ref 4.20–5.82)
RDW: 14 % (ref 11.0–14.6)
WBC: 3.1 10*3/uL — ABNORMAL LOW (ref 4.0–10.3)

## 2015-05-21 LAB — COMPREHENSIVE METABOLIC PANEL
ALK PHOS: 63 U/L (ref 40–150)
ALT: 17 U/L (ref 0–55)
AST: 19 U/L (ref 5–34)
Albumin: 3.9 g/dL (ref 3.5–5.0)
Anion Gap: 8 mEq/L (ref 3–11)
BUN: 14.5 mg/dL (ref 7.0–26.0)
CALCIUM: 9.1 mg/dL (ref 8.4–10.4)
CHLORIDE: 108 meq/L (ref 98–109)
CO2: 26 mEq/L (ref 22–29)
Creatinine: 1 mg/dL (ref 0.7–1.3)
Glucose: 92 mg/dl (ref 70–140)
POTASSIUM: 3.8 meq/L (ref 3.5–5.1)
Sodium: 141 mEq/L (ref 136–145)
Total Bilirubin: 0.42 mg/dL (ref 0.20–1.20)
Total Protein: 6.8 g/dL (ref 6.4–8.3)

## 2015-05-21 LAB — MAGNESIUM: Magnesium: 2.5 mg/dl (ref 1.5–2.5)

## 2015-05-21 MED ORDER — LEUCOVORIN CALCIUM INJECTION 100 MG: 20.0000 mg/m2 | Freq: Once | INTRAMUSCULAR | Status: DC

## 2015-05-21 MED ORDER — FOSAPREPITANT DIMEGLUMINE INJECTION 150 MG
Freq: Once | INTRAVENOUS | Status: AC
  Administered 2015-05-21: 12:00:00 via INTRAVENOUS
  Filled 2015-05-21: qty 5

## 2015-05-21 MED ORDER — PALONOSETRON HCL INJECTION 0.25 MG/5ML
INTRAVENOUS | Status: AC
  Filled 2015-05-21: qty 5

## 2015-05-21 MED ORDER — SODIUM CHLORIDE 0.9 % IV SOLN: Freq: Once | INTRAVENOUS | Status: DC

## 2015-05-21 MED ORDER — PALONOSETRON HCL INJECTION 0.25 MG/5ML
0.2500 mg | Freq: Once | INTRAVENOUS | Status: AC
  Administered 2015-05-21: 0.25 mg via INTRAVENOUS

## 2015-05-21 MED ORDER — FLUOROURACIL CHEMO INJECTION 2.5 GM/50ML
400.0000 mg/m2 | Freq: Once | INTRAVENOUS | Status: AC
  Administered 2015-05-21: 750 mg via INTRAVENOUS
  Filled 2015-05-21: qty 15

## 2015-05-21 MED ORDER — LEUCOVORIN CALCIUM INJECTION 350 MG
400.0000 mg/m2 | Freq: Once | INTRAVENOUS | Status: AC
  Administered 2015-05-21: 764 mg via INTRAVENOUS
  Filled 2015-05-21: qty 38.2

## 2015-05-21 MED ORDER — PANITUMUMAB CHEMO INJECTION 100 MG/5ML
6.0000 mg/kg | Freq: Once | INTRAVENOUS | Status: AC
  Administered 2015-05-21: 440 mg via INTRAVENOUS
  Filled 2015-05-21: qty 2

## 2015-05-21 MED ORDER — SODIUM CHLORIDE 0.9 % IV SOLN
Freq: Once | INTRAVENOUS | Status: AC
  Administered 2015-05-21: 11:00:00 via INTRAVENOUS

## 2015-05-21 MED ORDER — ATROPINE SULFATE 1 MG/ML IJ SOLN
0.5000 mg | Freq: Once | INTRAMUSCULAR | Status: AC | PRN
  Administered 2015-05-21: 0.5 mg via INTRAVENOUS

## 2015-05-21 MED ORDER — SODIUM CHLORIDE 0.9 % IJ SOLN
10.0000 mL | INTRAMUSCULAR | Status: DC | PRN
  Filled 2015-05-21: qty 10

## 2015-05-21 MED ORDER — DEXTROSE 5 % IV SOLN
135.0000 mg/m2 | Freq: Once | INTRAVENOUS | Status: AC
  Administered 2015-05-21: 258 mg via INTRAVENOUS
  Filled 2015-05-21: qty 9.9

## 2015-05-21 MED ORDER — HEPARIN SOD (PORK) LOCK FLUSH 100 UNIT/ML IV SOLN
500.0000 [IU] | Freq: Once | INTRAVENOUS | Status: DC | PRN
  Filled 2015-05-21: qty 5

## 2015-05-21 MED ORDER — ATROPINE SULFATE 1 MG/ML IJ SOLN
INTRAMUSCULAR | Status: AC
  Filled 2015-05-21: qty 1

## 2015-05-21 MED ORDER — MINOCYCLINE HCL 100 MG PO CAPS: 100.0000 mg | ORAL_CAPSULE | Freq: Two times a day (BID) | ORAL | Status: DC

## 2015-05-21 MED ORDER — FLUOROURACIL CHEMO INJECTION 5 GM/100ML
2400.0000 mg/m2 | INTRAVENOUS | Status: DC
  Administered 2015-05-21: 4600 mg via INTRAVENOUS
  Filled 2015-05-21: qty 92

## 2015-05-21 MED ORDER — PROCHLORPERAZINE MALEATE 10 MG PO TABS
10.0000 mg | ORAL_TABLET | Freq: Four times a day (QID) | ORAL | Status: DC | PRN
Start: 2015-05-21 — End: 2016-05-14

## 2015-05-21 NOTE — Progress Notes (Signed)
OK to treat with PLT 98k, per Dr. Benay Spice. Chemo has been dose reduced.

## 2015-05-21 NOTE — Progress Notes (Addendum)
[facility] OFFICE PROGRESS NOTE   Diagnosis:  Rectal cancer  INTERVAL HISTORY:   Jacob [name] returns for follow-up. Bowel habits continue to be erratic. No nausea or vomiting. He denies pain. Good appetite. No bleeding.  Objective:  Vital signs in last 24 hours:  Blood pressure 133/77, pulse 56, temperature 97.8 F (36.6 C), temperature source Oral, resp. rate 18, height '5\' 10"'  (1.778 m), weight 157 lb 8 oz (71.442 kg), SpO2 100 %.    HEENT: No thrush or ulcers. Resp: Lungs clear bilaterally. Cardio: Regular rate and rhythm. GI: No hepatomegaly. Vascular: No leg edema. Port-A-Cath without erythema.  Lab Results:  Lab Results  Component Value Date   WBC 3.1* 05/21/2015   HGB 13.9 05/21/2015   HCT 41.7 05/21/2015   MCV 88.7 05/21/2015   PLT 98* 05/21/2015   NEUTROABS 2.1 05/21/2015    Imaging:  No results found.  Medications: I have reviewed the patient's current medications.  Assessment/Plan: 1. Rectal cancer. Partially obstructing mass noted 1-2 cm from the anal verge on a colonoscopy 03/06/2012. Endoscopic ultrasound 03/14/2012 with a 7.5 mm thick, 3.2 cm wide hypoechoic, irregularly bordered mass that clearly passed into and through the muscularis propria layer of the distal rectal wall (uT3); 3 small (largest 7 mm) perirectal lymph nodes. The lymph nodes were all round, discrete, hypoechoic, homogenous; suspicious for malignant involvement (uN1).  No RAS mutation identified by [facility] 1 testing on the colon resection specimen 06/29/2012, APC alteration identified, microsatellite stable  He began radiation and concurrent Xeloda chemotherapy on 03/20/2012, completed 04/27/2012.   Low anterior resection/coloanal anastomosis and diverting ileostomy 06/29/2012 with the final pathology revealing a T2N0 tumor with extensive fibrosis and negative margins.   Cycle 1 of adjuvant CAPOX 07/19/2012. Cycle 5 of adjuvant CAPOX 10/11/2012.   CEA 2.5 on  11/14/2012.   CEA 14.6 03/08/2013.   Restaging CT evaluation 03/08/2013 with a 4 mm pulmonary nodule left lung base not identified on comparison exam; new liver lesions including a 29 x 26 mm irregular peripheral enhancing rounded lesion in the dome of the right hepatic lobe, a less well-defined new subcapsular lesion in the lateral right hepatic lobe measuring 12 mm, a new subcapsular lesion in the anterior right hepatic lobe adjacent to the gallbladder fossa measuring 10 mm; a rounded low-density lesion in the inferior right hepatic lobe measuring 10 mm compared to 7 mm on the prior study (radiologist commented this may represent an enlarged cyst).   MRI of the abdomen 04/12/2013 confirmed multiple T2 hyperintense metastatic lesions throughout the right liver   Initiation of FOLFIRI/Avastin with genotype based irinotecan dosing per the [facility] study 04/05/2013.   Restaging CT evaluation on 06/20/2013 (after 2 cycles/4 treatments) showed improvement in the liver metastases and stable size of a left lower lobe pulmonary nodule now with central cavitation.   Continuation of FOLFIRI/Avastin.   Restaging CT evaluation 08/14/2013-decrease in the size of liver metastases, slight decrease in the size of a cavitary left lower lobe nodule, no evidence of disease progression.   Status post right hepatic lobectomy 09/17/2013. Pathology showed multiple foci of metastatic adenocarcinoma (5 nodules of metastatic adenocarcinoma 4 of which are subcapsular with the nodules ranging in size from 0.6-1.8 cm in greatest dimension). Margins not involved. Biopsy of a portal lymph node showed benign adipose tissue; no lymph node tissue or malignancy.   Normal CEA 11/06/2013   CT 11/06/2013 with a new pleural-based right lower chest lesion, slight in enlargement of the a left-sided  lung nodule  CT 01/29/2014 with a decrease in the right lower chest pleural-based lesion and a slight increase of a left-sided lung  lesion, other lung lesions were stable  CT chest 05/02/2014 with a decrease in the size of a right lower lobe pulmonary nodule months similar size of a dominant left lower lobe nodule, minimal enlargement of a smaller left lower lobe nodule, no new site of disease  CT chest 11/07/2014 with a slight increase in several left-sided lung nodules  CT chest 02/10/2015 with new small left hilar lymph nodes, a possible new left lower lobe nodule, and stability of other lung nodules  PET scan 03/12/2015 with hypermetabolic left hilar nodes, hypermetabolic left lower lobe nodules, hyper metabolic retroperitoneal nodes, intense hypermetabolism at the coloanal anastomosis, and hypermetabolic thickening in the presacral space  Cycle 1 of FOLFIRI/PANITUMUMAB 05/21/2015 2. Irregular bowel habits/rectal bleeding secondary to #1. 3. History of Mild elevation of the liver enzymes. Question secondary to hepatic steatosis. 4. Indeterminate 8 mm posterior right liver lesion on the staging CT 03/06/2012. 5. Mildly elevated CEA at 5.7 on 03/06/2012. 6. History of radiation erythema at the groin and perineum 7. 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 No clinical evidence of thrombophlebitis or extravasation. 8. Delayed nausea following chemotherapy-Decadron prophylaxis was added with cycle 3 CAPOX-improved. 9. Ileostomy takedown 12/07/2012. 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 11. Port-A-Cath placement 12/31/204 12. Severe neutropenia secondary to chemotherapy following cycle 1 of FOLFIRI, chemotherapy was dose reduced and he received Neulasta with day 15 cycle 1  13. Nausea and vomiting following cycle 1 of FOLFIRI 14. Rectal stricture 15. Leukopenia/Thrombocytopenia-persistent, potentially a sequelae of chemotherapy or hepatic toxicity from chemotherapy/radiation. Bone marrow biopsy 11/20/2014 showed cellular bone marrow  with trilineage hematopoiesis. Significant dyspoiesis was not present and there was no evidence of metastatic carcinoma. Cytogenetic analysis showed the presence of normal male chromosomes with no observable clonal chromosomal abnormalities.  probable cirrhosis 16. Genetic testing-negative genetic panel in March 2014    Disposition: Mr. Opara appears stable. The plan is to proceed with cycle 1 FOLFIRI/PANITUMUMAB today as scheduled. We again reviewed potential toxicities. Prescriptions were sent to his pharmacy for Compazine and minocycline. He will receive Neulasta on the day of pump discontinuation. We reviewed potential toxicities associated with Neulasta including bone pain, rash, splenic rupture. He is agreeable to proceed.  He will return for a follow-up visit and cycle 2 FOLFIRI/PANITUMUMAB on 06/05/2015. He will contact the office in the interim with any problems.  Patient seen with Dr. Benay Spice.    Ned Card ANP/GNP-BC   05/21/2015  9:22 AM This was a shared visit with Ned Card. Mr. Hartt will begin FOLFIRI/panitumumab today. He will meet with the research meters to discuss enrollment on the MET amplified study.  We dose reduced the irinotecan and added Neulasta secondary to baseline cytopenias.  Julieanne Manson, M.D.

## 2015-05-21 NOTE — Telephone Encounter (Signed)
per pof to sch pt appt-sent MW emailt o sch trmt-pt to get updated copy b4 leaving

## 2015-05-21 NOTE — Patient Instructions (Addendum)
Panitumumab Solution for Injection What is this medicine? PANITUMUMAB (pan i TOOM ue mab) is a monoclonal antibody. It is used to treat colorectal cancer. This medicine may be used for other purposes; ask your health care provider or pharmacist if you have questions. What should I tell my health care provider before I take this medicine? They need to know if you have any of these conditions: -lung disease, especially lung fibrosis -skin conditions or sensitivity -an unusual or allergic reaction to panitumumab, mouse proteins, other medicines, foods, dyes, or preservatives -pregnant or trying to get pregnant -breast-feeding How should I use this medicine? This drug is given as an infusion into a vein. It is administered in a hospital or clinic by a specially trained health care professional. Talk to your pediatrician regarding the use of this medicine in children. Special care may be needed. Overdosage: If you think you have taken too much of this medicine contact a poison control center or emergency room at once. NOTE: This medicine is only for you. Do not share this medicine with others. What if I miss a dose? It is important not to miss your dose. Call your doctor or health care professional if you are unable to keep an appointment. What may interact with this medicine? -some medicines for cancer This list may not describe all possible interactions. Give your health care provider a list of all the medicines, herbs, non-prescription drugs, or dietary supplements you use. Also tell them if you smoke, drink alcohol, or use illegal drugs. Some items may interact with your medicine. What should I watch for while using this medicine? Visit your doctor for checks on your progress. This drug may make you feel generally unwell. This is not uncommon, as chemotherapy can affect healthy cells as well as cancer cells. Report any side effects. Continue your course of treatment even though you feel ill  unless your doctor tells you to stop. This medicine can make you more sensitive to the sun. Keep out of the sun while receiving this medicine and for 2 months after the last dose. If you cannot avoid being in the sun, wear protective clothing and use sunscreen. Do not use sun lamps or tanning beds/booths. In some cases, you may be given additional medicines to help with side effects. Follow all directions for their use. Call your doctor or health care professional for advice if you get a fever, chills or sore throat, or other symptoms of a cold or flu. Do not treat yourself. This drug decreases your body's ability to fight infections. Try to avoid being around people who are sick. Avoid taking products that contain aspirin, acetaminophen, ibuprofen, naproxen, or ketoprofen unless instructed by your doctor. These medicines may hide a fever. Do not become pregnant while taking this medicine and for 6 months after the last dose. Women should inform their doctor if they wish to become pregnant or think they might be pregnant. Men should not father a child while taking this medicine and for 6 months after the last dose. There is a potential for serious side effects to an unborn child. Talk to your health care professional or pharmacist for more information. Do not breast-feed an infant while taking this medicine. What side effects may I notice from receiving this medicine? Side effects that you should report to your doctor or health care professional as soon as possible: -allergic reactions like skin rash, itching or hives, swelling of the face, lips, or tongue -breathing problems -changes in vision -fast,  irregular heartbeat -feeling faint or lightheaded, falls -fever, chills -mouth sores -swelling of the ankles, feet, hands -unusually weak or tired Side effects that usually do not require medical attention (report to your doctor or health care professional if they continue or are bothersome): -changes  in skin like acne, cracks, skin dryness -constipation -diarrhea -eyelash growth -headache -nail changes -nausea, vomiting -stomach upset This list may not describe all possible side effects. Call your doctor for medical advice about side effects. You may report side effects to FDA at 1-800-FDA-1088. Where should I keep my medicine? This drug is given in a hospital or clinic and will not be stored at home. NOTE: This sheet is a summary. It may not cover all possible information. If you have questions about this medicine, talk to your doctor, pharmacist, or health care provider.    2016, Elsevier/[employer] Standard. (2014-05-07 17:21:33)  Irinotecan injection What is this medicine? IRINOTECAN (ir in oh TEE kan ) is a chemotherapy drug. It is used to treat colon and rectal cancer. This medicine may be used for other purposes; ask your health care provider or pharmacist if you have questions. What should I tell my health care provider before I take this medicine? They need to know if you have any of these conditions: -blood disorders -dehydration -diarrhea -infection (especially a virus infection such as chickenpox, cold sores, or herpes) -liver disease -low blood counts, like low white cell, platelet, or red cell counts -recent or ongoing radiation therapy -an unusual or allergic reaction to irinotecan, sorbitol, other chemotherapy, other medicines, foods, dyes, or preservatives -pregnant or trying to get pregnant -breast-feeding How should I use this medicine? This drug is given as an infusion into a vein. It is administered in a hospital or clinic by a specially trained health care professional. Talk to your pediatrician regarding the use of this medicine in children. Special care may be needed. Overdosage: If you think you have taken too much of this medicine contact a poison control center or emergency room at once. NOTE: This medicine is only for you. Do not share this medicine with  others. What if I miss a dose? It is important not to miss your dose. Call your doctor or health care professional if you are unable to keep an appointment. What may interact with this medicine? Do not take this medicine with any of the following medications: -atazanavir -certain medicines for fungal infections like itraconazole and ketoconazole -St. John's Wort This medicine may also interact with the following medications: -dexamethasone -diuretics -laxatives -medicines for seizures like carbamazepine, mephobarbital, phenobarbital, phenytoin, primidone -medicines to increase blood counts like filgrastim, pegfilgrastim, sargramostim -prochlorperazine -vaccines This list may not describe all possible interactions. Give your health care provider a list of all the medicines, herbs, non-prescription drugs, or dietary supplements you use. Also tell them if you smoke, drink alcohol, or use illegal drugs. Some items may interact with your medicine. What should I watch for while using this medicine? Your condition will be monitored carefully while you are receiving this medicine. You will need important blood work done while you are taking this medicine. This drug may make you feel generally unwell. This is not uncommon, as chemotherapy can affect healthy cells as well as cancer cells. Report any side effects. Continue your course of treatment even though you feel ill unless your doctor tells you to stop. In some cases, you may be given additional medicines to help with side effects. Follow all directions for their use. You may  get drowsy or dizzy. Do not drive, use machinery, or do anything that needs mental alertness until you know how this medicine affects you. Do not stand or sit up quickly, especially if you are an older patient. This reduces the risk of dizzy or fainting spells. Call your doctor or health care professional for advice if you get a fever, chills or sore throat, or other symptoms  of a cold or flu. Do not treat yourself. This drug decreases your body's ability to fight infections. Try to avoid being around people who are sick. This medicine may increase your risk to bruise or bleed. Call your doctor or health care professional if you notice any unusual bleeding. Be careful brushing and flossing your teeth or using a toothpick because you may get an infection or bleed more easily. If you have any dental work done, tell your dentist you are receiving this medicine. Avoid taking products that contain aspirin, acetaminophen, ibuprofen, naproxen, or ketoprofen unless instructed by your doctor. These medicines may hide a fever. Do not become pregnant while taking this medicine. Women should inform their doctor if they wish to become pregnant or think they might be pregnant. There is a potential for serious side effects to an unborn child. Talk to your health care professional or pharmacist for more information. Do not breast-feed an infant while taking this medicine. What side effects may I notice from receiving this medicine? Side effects that you should report to your doctor or health care professional as soon as possible: -allergic reactions like skin rash, itching or hives, swelling of the face, lips, or tongue -low blood counts - this medicine may decrease the number of white blood cells, red blood cells and platelets. You may be at increased risk for infections and bleeding. -signs of infection - fever or chills, cough, sore throat, pain or difficulty passing urine -signs of decreased platelets or bleeding - bruising, pinpoint red spots on the skin, black, tarry stools, blood in the urine -signs of decreased red blood cells - unusually weak or tired, fainting spells, lightheadedness -breathing problems -chest pain -diarrhea -feeling faint or lightheaded, falls -flushing, runny nose, sweating during infusion -mouth sores or pain -pain, swelling, redness or irritation where  injected -pain, swelling, warmth in the leg -pain, tingling, numbness in the hands or feet -problems with balance, talking, walking -stomach cramps, pain -trouble passing urine or change in the amount of urine -vomiting as to be unable to hold down drinks or food -yellowing of the eyes or skin Side effects that usually do not require medical attention (report to your doctor or health care professional if they continue or are bothersome): -constipation -hair loss -headache -loss of appetite -nausea, vomiting -stomach upset This list may not describe all possible side effects. Call your doctor for medical advice about side effects. You may report side effects to FDA at 1-800-FDA-1088. Where should I keep my medicine? This drug is given in a hospital or clinic and will not be stored at home. NOTE: This sheet is a summary. It may not cover all possible information. If you have questions about this medicine, talk to your doctor, pharmacist, or health care provider.    2016, Elsevier/[employer] Standard. (2012-09-04 16:29:32)  Leucovorin injection What is this medicine? LEUCOVORIN (loo koe VOR in) is used to prevent or treat the harmful effects of some medicines. This medicine is used to treat anemia caused by a low amount of folic acid in the body. It is also used with 5-fluorouracil (  5-FU) to treat colon cancer. This medicine may be used for other purposes; ask your health care provider or pharmacist if you have questions. What should I tell my health care provider before I take this medicine? They need to know if you have any of these conditions: -anemia from low levels of vitamin B-12 in the blood -an unusual or allergic reaction to leucovorin, folic acid, other medicines, foods, dyes, or preservatives -pregnant or trying to get pregnant -breast-feeding How should I use this medicine? This medicine is for injection into a muscle or into a vein. It is given by a health care professional in a  hospital or clinic setting. Talk to your pediatrician regarding the use of this medicine in children. Special care may be needed. Overdosage: If you think you have taken too much of this medicine contact a poison control center or emergency room at once. NOTE: This medicine is only for you. Do not share this medicine with others. What if I miss a dose? This does not apply. What may interact with this medicine? -capecitabine -fluorouracil -phenobarbital -phenytoin -primidone -trimethoprim-sulfamethoxazole This list may not describe all possible interactions. Give your health care provider a list of all the medicines, herbs, non-prescription drugs, or dietary supplements you use. Also tell them if you smoke, drink alcohol, or use illegal drugs. Some items may interact with your medicine. What should I watch for while using this medicine? Your condition will be monitored carefully while you are receiving this medicine. This medicine may increase the side effects of 5-fluorouracil, 5-FU. Tell your doctor or health care professional if you have diarrhea or mouth sores that do not get better or that get worse. What side effects may I notice from receiving this medicine? Side effects that you should report to your doctor or health care professional as soon as possible: -allergic reactions like skin rash, itching or hives, swelling of the face, lips, or tongue -breathing problems -fever, infection -mouth sores -unusual bleeding or bruising -unusually weak or tired Side effects that usually do not require medical attention (report to your doctor or health care professional if they continue or are bothersome): -constipation or diarrhea -loss of appetite -nausea, vomiting This list may not describe all possible side effects. Call your doctor for medical advice about side effects. You may report side effects to FDA at 1-800-FDA-1088. Where should I keep my medicine? This drug is given in a  hospital or clinic and will not be stored at home. NOTE: This sheet is a summary. It may not cover all possible information. If you have questions about this medicine, talk to your doctor, pharmacist, or health care provider.    2016, Else[employer] Standard. (2007-09-12 16:50:29)  Fluorouracil, 5-FU injection What is this medicine? FLUOROURACIL, 5-FU (flure oh YOOR a sil) is a chemotherapy drug. It slows the growth of cancer cells. This medicine is used to treat many types of cancer like breast cancer, colon or rectal cancer, pancreatic cancer, and stomach cancer. This medicine may be used for other purposes; ask your health care provider or pharmacist if you have questions. What should I tell my health care provider before I take this medicine? They need to know if you have any of these conditions: -blood disorders -dihydropyrimidine dehydrogenase (DPD) deficiency -infection (especially a virus infection such as chickenpox, cold sores, or herpes) -kidney disease -liver disease -malnourished, poor nutrition -recent or ongoing radiation therapy -an unusual or allergic reaction to fluorouracil, other chemotherapy, other medicines, foods, dyes, or preservatives -pregnant or  trying to get pregnant -breast-feeding How should I use this medicine? This drug is given as an infusion or injection into a vein. It is administered in a hospital or clinic by a specially trained health care professional. Talk to your pediatrician regarding the use of this medicine in children. Special care may be needed. Overdosage: If you think you have taken too much of this medicine contact a poison control center or emergency room at once. NOTE: This medicine is only for you. Do not share this medicine with others. What if I miss a dose? It is important not to miss your dose. Call your doctor or health care professional if you are unable to keep an appointment. What may interact with this  medicine? -allopurinol -cimetidine -dapsone -digoxin -hydroxyurea -leucovorin -levamisole -medicines for seizures like ethotoin, fosphenytoin, phenytoin -medicines to increase blood counts like filgrastim, pegfilgrastim, sargramostim -medicines that treat or prevent blood clots like warfarin, enoxaparin, and dalteparin -methotrexate -metronidazole -pyrimethamine -some other chemotherapy drugs like busulfan, cisplatin, estramustine, vinblastine -trimethoprim -trimetrexate -vaccines Talk to your doctor or health care professional before taking any of these medicines: -acetaminophen -aspirin -ibuprofen -ketoprofen -naproxen This list may not describe all possible interactions. Give your health care provider a list of all the medicines, herbs, non-prescription drugs, or dietary supplements you use. Also tell them if you smoke, drink alcohol, or use illegal drugs. Some items may interact with your medicine. What should I watch for while using this medicine? Visit your doctor for checks on your progress. This drug may make you feel generally unwell. This is not uncommon, as chemotherapy can affect healthy cells as well as cancer cells. Report any side effects. Continue your course of treatment even though you feel ill unless your doctor tells you to stop. In some cases, you may be given additional medicines to help with side effects. Follow all directions for their use. Call your doctor or health care professional for advice if you get a fever, chills or sore throat, or other symptoms of a cold or flu. Do not treat yourself. This drug decreases your body's ability to fight infections. Try to avoid being around people who are sick. This medicine may increase your risk to bruise or bleed. Call your doctor or health care professional if you notice any unusual bleeding. Be careful brushing and flossing your teeth or using a toothpick because you may get an infection or bleed more easily. If you  have any dental work done, tell your dentist you are receiving this medicine. Avoid taking products that contain aspirin, acetaminophen, ibuprofen, naproxen, or ketoprofen unless instructed by your doctor. These medicines may hide a fever. Do not become pregnant while taking this medicine. Women should inform their doctor if they wish to become pregnant or think they might be pregnant. There is a potential for serious side effects to an unborn child. Talk to your health care professional or pharmacist for more information. Do not breast-feed an infant while taking this medicine. Men should inform their doctor if they wish to father a child. This medicine may lower sperm counts. Do not treat diarrhea with over the counter products. Contact your doctor if you have diarrhea that lasts more than 2 days or if it is severe and watery. This medicine can make you more sensitive to the sun. Keep out of the sun. If you cannot avoid being in the sun, wear protective clothing and use sunscreen. Do not use sun lamps or tanning beds/booths. What side effects may I notice  from receiving this medicine? Side effects that you should report to your doctor or health care professional as soon as possible: -allergic reactions like skin rash, itching or hives, swelling of the face, lips, or tongue -low blood counts - this medicine may decrease the number of white blood cells, red blood cells and platelets. You may be at increased risk for infections and bleeding. -signs of infection - fever or chills, cough, sore throat, pain or difficulty passing urine -signs of decreased platelets or bleeding - bruising, pinpoint red spots on the skin, black, tarry stools, blood in the urine -signs of decreased red blood cells - unusually weak or tired, fainting spells, lightheadedness -breathing problems -changes in vision -chest pain -mouth sores -nausea and vomiting -pain, swelling, redness at site where injected -pain, tingling,  numbness in the hands or feet -redness, swelling, or sores on hands or feet -stomach pain -unusual bleeding Side effects that usually do not require medical attention (report to your doctor or health care professional if they continue or are bothersome): -changes in finger or toe nails -diarrhea -dry or itchy skin -hair loss -headache -loss of appetite -sensitivity of eyes to the light -stomach upset -unusually teary eyes This list may not describe all possible side effects. Call your doctor for medical advice about side effects. You may report side effects to FDA at 1-800-FDA-1088. Where should I keep my medicine? This drug is given in a hospital or clinic and will not be stored at home. NOTE: This sheet is a summary. It may not cover all possible information. If you have questions about this medicine, talk to your doctor, pharmacist, or health care provider.    2016, Elsevier/Gold Standard. (2007-07-12 13:53:16)    [facility] Discharge Instructions for Patients Receiving Chemotherapy  Today you received the following chemotherapy agents:  Vectibix, Irinotecan, Leucovorin, Fluorouracil  To help prevent nausea and vomiting after your treatment, we encourage you to take your nausea medication as prescribed.   If you develop nausea and vomiting that is not controlled by your nausea medication, call the clinic.   BELOW ARE SYMPTOMS THAT SHOULD BE REPORTED IMMEDIATELY:  *FEVER GREATER THAN 100.5 F  *CHILLS WITH OR WITHOUT FEVER  NAUSEA AND VOMITING THAT IS NOT CONTROLLED WITH YOUR NAUSEA MEDICATION  *UNUSUAL SHORTNESS OF BREATH  *UNUSUAL BRUISING OR BLEEDING  TENDERNESS IN MOUTH AND THROAT WITH OR WITHOUT PRESENCE OF ULCERS  *URINARY PROBLEMS  *BOWEL PROBLEMS  UNUSUAL RASH Items with * indicate a potential emergency and should be followed up as soon as possible.  Feel free to call the clinic you have any questions or concerns. The clinic phone number  is [phone].  Please show the [place] at check-in to the Emergency Department and triage nurse.

## 2015-05-21 NOTE — Telephone Encounter (Signed)
Per staff message and POF I have scheduled appts. Advised scheduler of appts. JMW  

## 2015-05-22 ENCOUNTER — Other Ambulatory Visit: Payer: Self-pay | Admitting: *Deleted

## 2015-05-22 ENCOUNTER — Encounter: Payer: Self-pay | Admitting: *Deleted

## 2015-05-22 DIAGNOSIS — C787 Secondary malignant neoplasm of liver and intrahepatic bile duct: Principal | ICD-10-CM

## 2015-05-22 DIAGNOSIS — C20 Malignant neoplasm of rectum: Secondary | ICD-10-CM

## 2015-05-22 LAB — CEA: CEA1: 5.6 ng/mL — AB (ref 0.0–4.7)

## 2015-05-22 LAB — CEA (PARALLEL TESTING): CEA: 3.5 ng/mL — ABNORMAL HIGH

## 2015-05-22 MED ORDER — MINOCYCLINE HCL 100 MG PO CAPS: 100.0000 mg | ORAL_CAPSULE | Freq: Two times a day (BID) | ORAL | Status: DC

## 2015-05-22 NOTE — Progress Notes (Signed)
Chemo f/u call.  Left message on v/m for patient to call if any problems.

## 2015-05-22 NOTE — Telephone Encounter (Signed)
Message from pt requesting Minocycline Rx to be sent to [facility] on [address]. [facility] has not been able to fill Rx. Rx sent electronically. (Confirmed with pharmacy physical address is [address] Left message on voicemail informing pt.

## 2015-05-23 ENCOUNTER — Ambulatory Visit (HOSPITAL_BASED_OUTPATIENT_CLINIC_OR_DEPARTMENT_OTHER): Payer: BC Managed Care – PPO

## 2015-05-23 VITALS — BP 117/76 | HR 53 | Temp 97.6°F

## 2015-05-23 DIAGNOSIS — C787 Secondary malignant neoplasm of liver and intrahepatic bile duct: Secondary | ICD-10-CM | POA: Diagnosis not present

## 2015-05-23 DIAGNOSIS — Z5189 Encounter for other specified aftercare: Secondary | ICD-10-CM | POA: Diagnosis not present

## 2015-05-23 DIAGNOSIS — C20 Malignant neoplasm of rectum: Secondary | ICD-10-CM

## 2015-05-23 MED ORDER — PEGFILGRASTIM INJECTION 6 MG/0.6ML ~~LOC~~
6.0000 mg | PREFILLED_SYRINGE | Freq: Once | SUBCUTANEOUS | Status: AC
  Administered 2015-05-23: 6 mg via SUBCUTANEOUS
  Filled 2015-05-23: qty 0.6

## 2015-05-23 MED ORDER — HEPARIN SOD (PORK) LOCK FLUSH 100 UNIT/ML IV SOLN
500.0000 [IU] | Freq: Once | INTRAVENOUS | Status: AC | PRN
  Administered 2015-05-23: 500 [IU]
  Filled 2015-05-23: qty 5

## 2015-05-23 MED ORDER — SODIUM CHLORIDE 0.9 % IJ SOLN
10.0000 mL | INTRAMUSCULAR | Status: DC | PRN
  Administered 2015-05-23: 10 mL
  Filled 2015-05-23: qty 10

## 2015-06-01 ENCOUNTER — Other Ambulatory Visit: Payer: Self-pay | Admitting: Oncology

## 2015-06-04 ENCOUNTER — Ambulatory Visit: Payer: BC Managed Care – PPO

## 2015-06-05 ENCOUNTER — Telehealth: Payer: Self-pay | Admitting: Oncology

## 2015-06-05 ENCOUNTER — Ambulatory Visit (HOSPITAL_BASED_OUTPATIENT_CLINIC_OR_DEPARTMENT_OTHER): Payer: BC Managed Care – PPO | Admitting: Nurse Practitioner

## 2015-06-05 ENCOUNTER — Other Ambulatory Visit (HOSPITAL_BASED_OUTPATIENT_CLINIC_OR_DEPARTMENT_OTHER): Payer: BC Managed Care – PPO

## 2015-06-05 ENCOUNTER — Telehealth: Payer: Self-pay | Admitting: *Deleted

## 2015-06-05 ENCOUNTER — Ambulatory Visit (HOSPITAL_BASED_OUTPATIENT_CLINIC_OR_DEPARTMENT_OTHER): Payer: BC Managed Care – PPO

## 2015-06-05 ENCOUNTER — Encounter: Payer: Self-pay | Admitting: *Deleted

## 2015-06-05 VITALS — BP 113/76 | HR 72 | Temp 99.6°F | Resp 18 | Ht 70.0 in | Wt 156.0 lb

## 2015-06-05 DIAGNOSIS — C20 Malignant neoplasm of rectum: Secondary | ICD-10-CM

## 2015-06-05 DIAGNOSIS — L271 Localized skin eruption due to drugs and medicaments taken internally: Secondary | ICD-10-CM | POA: Diagnosis not present

## 2015-06-05 DIAGNOSIS — Z5112 Encounter for antineoplastic immunotherapy: Secondary | ICD-10-CM | POA: Diagnosis not present

## 2015-06-05 DIAGNOSIS — C787 Secondary malignant neoplasm of liver and intrahepatic bile duct: Secondary | ICD-10-CM | POA: Diagnosis not present

## 2015-06-05 LAB — COMPREHENSIVE METABOLIC PANEL
ALBUMIN: 3.3 g/dL — AB (ref 3.5–5.0)
ALT: 39 U/L (ref 0–55)
ANION GAP: 7 meq/L (ref 3–11)
AST: 24 U/L (ref 5–34)
Alkaline Phosphatase: 76 U/L (ref 40–150)
BUN: 11.2 mg/dL (ref 7.0–26.0)
CALCIUM: 9 mg/dL (ref 8.4–10.4)
CO2: 29 mEq/L (ref 22–29)
CREATININE: 0.8 mg/dL (ref 0.7–1.3)
Chloride: 106 mEq/L (ref 98–109)
EGFR: >90 mL/min/{1.73_m2} (ref >90)
Glucose: 93 mg/dl (ref 70–140)
Potassium: 3.8 mEq/L (ref 3.5–5.1)
Sodium: 141 mEq/L (ref 136–145)
TOTAL PROTEIN: 6.4 g/dL (ref 6.4–8.3)

## 2015-06-05 LAB — CBC WITH DIFFERENTIAL/PLATELET
BASO%: 0.3 % (ref 0.0–2.0)
Basophils Absolute: 0 10*3/uL (ref 0.0–0.1)
EOS%: 1.2 % (ref 0.0–7.0)
Eosinophils Absolute: 0.1 10*3/uL (ref 0.0–0.5)
HEMATOCRIT: 39.3 % (ref 38.4–49.9)
HEMOGLOBIN: 13.3 g/dL (ref 13.0–17.1)
LYMPH#: 1 10*3/uL (ref 0.9–3.3)
LYMPH%: 15.3 % (ref 14.0–49.0)
MCH: 30.1 pg (ref 27.2–33.4)
MCHC: 33.8 g/dL (ref 32.0–36.0)
MCV: 88.9 fL (ref 79.3–98.0)
MONO#: 0.5 10*3/uL (ref 0.1–0.9)
MONO%: 7.7 % (ref 0.0–14.0)
NEUT%: 75.5 % — AB (ref 39.0–75.0)
NEUTROS ABS: 5.1 10*3/uL (ref 1.5–6.5)
PLATELETS: 140 10*3/uL (ref 140–400)
RBC: 4.42 10*6/uL (ref 4.20–5.82)
RDW: 13.4 % (ref 11.0–14.6)
WBC: 6.7 10*3/uL (ref 4.0–10.3)

## 2015-06-05 LAB — MAGNESIUM: MAGNESIUM: 2.3 mg/dL (ref 1.5–2.5)

## 2015-06-05 MED ORDER — SODIUM CHLORIDE 0.9 % IV SOLN
6.0000 mg/kg | Freq: Once | INTRAVENOUS | Status: AC
  Administered 2015-06-05: 440 mg via INTRAVENOUS
  Filled 2015-06-05: qty 20

## 2015-06-05 MED ORDER — FLUOROURACIL CHEMO INJECTION 2.5 GM/50ML
400.0000 mg/m2 | Freq: Once | INTRAVENOUS | Status: AC
  Administered 2015-06-05: 750 mg via INTRAVENOUS
  Filled 2015-06-05: qty 15

## 2015-06-05 MED ORDER — ATROPINE SULFATE 1 MG/ML IJ SOLN: 0.5000 mg | Freq: Once | INTRAMUSCULAR | Status: DC | PRN

## 2015-06-05 MED ORDER — SODIUM CHLORIDE 0.9 % IV SOLN
2400.0000 mg/m2 | INTRAVENOUS | Status: DC
  Administered 2015-06-05: 4600 mg via INTRAVENOUS
  Filled 2015-06-05: qty 92

## 2015-06-05 MED ORDER — PALONOSETRON HCL INJECTION 0.25 MG/5ML
INTRAVENOUS | Status: AC
  Filled 2015-06-05: qty 5

## 2015-06-05 MED ORDER — SODIUM CHLORIDE 0.9 % IV SOLN
Freq: Once | INTRAVENOUS | Status: AC
  Administered 2015-06-05: 12:00:00 via INTRAVENOUS

## 2015-06-05 MED ORDER — SODIUM CHLORIDE 0.9 % IV SOLN
Freq: Once | INTRAVENOUS | Status: AC
  Administered 2015-06-05: 13:00:00 via INTRAVENOUS
  Filled 2015-06-05: qty 5

## 2015-06-05 MED ORDER — ATROPINE SULFATE 1 MG/ML IJ SOLN
INTRAMUSCULAR | Status: AC
  Filled 2015-06-05: qty 1

## 2015-06-05 MED ORDER — DEXTROSE 5 % IV SOLN
400.0000 mg/m2 | Freq: Once | INTRAVENOUS | Status: AC
  Administered 2015-06-05: 764 mg via INTRAVENOUS
  Filled 2015-06-05: qty 38.2

## 2015-06-05 MED ORDER — IRINOTECAN HCL CHEMO INJECTION 100 MG/5ML
136.0000 mg/m2 | Freq: Once | INTRAVENOUS | Status: AC
  Administered 2015-06-05: 260 mg via INTRAVENOUS
  Filled 2015-06-05: qty 10

## 2015-06-05 MED ORDER — PALONOSETRON HCL INJECTION 0.25 MG/5ML
0.2500 mg | Freq: Once | INTRAVENOUS | Status: AC
  Administered 2015-06-05: 0.25 mg via INTRAVENOUS

## 2015-06-05 NOTE — Progress Notes (Signed)
1422- Pt refusing atropine at this time prior to irinotecan. Encouraged patient to let us know if he begins to have any GI symptoms that would warrant atropine; pt voices understanding.

## 2015-06-05 NOTE — Patient Instructions (Signed)
Panitumumab Solution for Injection What is this medicine? PANITUMUMAB (pan i TOOM ue mab) is a monoclonal antibody. It is used to treat colorectal cancer. This medicine may be used for other purposes; ask your health care provider or pharmacist if you have questions. What should I tell my health care provider before I take this medicine? They need to know if you have any of these conditions: -lung disease, especially lung fibrosis -skin conditions or sensitivity -an unusual or allergic reaction to panitumumab, mouse proteins, other medicines, foods, dyes, or preservatives -pregnant or trying to get pregnant -breast-feeding How should I use this medicine? This drug is given as an infusion into a vein. It is administered in a hospital or clinic by a specially trained health care professional. Talk to your pediatrician regarding the use of this medicine in children. Special care may be needed. Overdosage: If you think you have taken too much of this medicine contact a poison control center or emergency room at once. NOTE: This medicine is only for you. Do not share this medicine with others. What if I miss a dose? It is important not to miss your dose. Call your doctor or health care professional if you are unable to keep an appointment. What may interact with this medicine? -some medicines for cancer This list may not describe all possible interactions. Give your health care provider a list of all the medicines, herbs, non-prescription drugs, or dietary supplements you use. Also tell them if you smoke, drink alcohol, or use illegal drugs. Some items may interact with your medicine. What should I watch for while using this medicine? Visit your doctor for checks on your progress. This drug may make you feel generally unwell. This is not uncommon, as chemotherapy can affect healthy cells as well as cancer cells. Report any side effects. Continue your course of treatment even though you feel ill  unless your doctor tells you to stop. This medicine can make you more sensitive to the sun. Keep out of the sun while receiving this medicine and for 2 months after the last dose. If you cannot avoid being in the sun, wear protective clothing and use sunscreen. Do not use sun lamps or tanning beds/booths. In some cases, you may be given additional medicines to help with side effects. Follow all directions for their use. Call your doctor or health care professional for advice if you get a fever, chills or sore throat, or other symptoms of a cold or flu. Do not treat yourself. This drug decreases your body's ability to fight infections. Try to avoid being around people who are sick. Avoid taking products that contain aspirin, acetaminophen, ibuprofen, naproxen, or ketoprofen unless instructed by your doctor. These medicines may hide a fever. Do not become pregnant while taking this medicine and for 6 months after the last dose. Women should inform their doctor if they wish to become pregnant or think they might be pregnant. Men should not father a child while taking this medicine and for 6 months after the last dose. There is a potential for serious side effects to an unborn child. Talk to your health care professional or pharmacist for more information. Do not breast-feed an infant while taking this medicine. What side effects may I notice from receiving this medicine? Side effects that you should report to your doctor or health care professional as soon as possible: -allergic reactions like skin rash, itching or hives, swelling of the face, lips, or tongue -breathing problems -changes in vision -fast,  irregular heartbeat -feeling faint or lightheaded, falls -fever, chills -mouth sores -swelling of the ankles, feet, hands -unusually weak or tired Side effects that usually do not require medical attention (report to your doctor or health care professional if they continue or are bothersome): -changes  in skin like acne, cracks, skin dryness -constipation -diarrhea -eyelash growth -headache -nail changes -nausea, vomiting -stomach upset This list may not describe all possible side effects. Call your doctor for medical advice about side effects. You may report side effects to FDA at 1-800-FDA-1088. Where should I keep my medicine? This drug is given in a hospital or clinic and will not be stored at home. NOTE: This sheet is a summary. It may not cover all possible information. If you have questions about this medicine, talk to your doctor, pharmacist, or health care provider.    2016, [employer]. (2014-05-07 17:21:33)  Irinotecan injection What is this medicine? IRINOTECAN (ir in oh TEE kan ) is a chemotherapy drug. It is used to treat colon and rectal cancer. This medicine may be used for other purposes; ask your health care provider or pharmacist if you have questions. What should I tell my health care provider before I take this medicine? They need to know if you have any of these conditions: -blood disorders -dehydration -diarrhea -infection (especially a virus infection such as chickenpox, cold sores, or herpes) -liver disease -low blood counts, like low white cell, platelet, or red cell counts -recent or ongoing radiation therapy -an unusual or allergic reaction to irinotecan, sorbitol, other chemotherapy, other medicines, foods, dyes, or preservatives -pregnant or trying to get pregnant -breast-feeding How should I use this medicine? This drug is given as an infusion into a vein. It is administered in a hospital or clinic by a specially trained health care professional. Talk to your pediatrician regarding the use of this medicine in children. Special care may be needed. Overdosage: If you think you have taken too much of this medicine contact a poison control center or emergency room at once. NOTE: This medicine is only for you. Do not share this medicine with  others. What if I miss a dose? It is important not to miss your dose. Call your doctor or health care professional if you are unable to keep an appointment. What may interact with this medicine? Do not take this medicine with any of the following medications: -atazanavir -certain medicines for fungal infections like itraconazole and ketoconazole -St. John's Wort This medicine may also interact with the following medications: -dexamethasone -diuretics -laxatives -medicines for seizures like carbamazepine, mephobarbital, phenobarbital, phenytoin, primidone -medicines to increase blood counts like filgrastim, pegfilgrastim, sargramostim -prochlorperazine -vaccines This list may not describe all possible interactions. Give your health care provider a list of all the medicines, herbs, non-prescription drugs, or dietary supplements you use. Also tell them if you smoke, drink alcohol, or use illegal drugs. Some items may interact with your medicine. What should I watch for while using this medicine? Your condition will be monitored carefully while you are receiving this medicine. You will need important blood work done while you are taking this medicine. This drug may make you feel generally unwell. This is not uncommon, as chemotherapy can affect healthy cells as well as cancer cells. Report any side effects. Continue your course of treatment even though you feel ill unless your doctor tells you to stop. In some cases, you may be given additional medicines to help with side effects. Follow all directions for their use. You may  get drowsy or dizzy. Do not drive, use machinery, or do anything that needs mental alertness until you know how this medicine affects you. Do not stand or sit up quickly, especially if you are an older patient. This reduces the risk of dizzy or fainting spells. Call your doctor or health care professional for advice if you get a fever, chills or sore throat, or other symptoms  of a cold or flu. Do not treat yourself. This drug decreases your body's ability to fight infections. Try to avoid being around people who are sick. This medicine may increase your risk to bruise or bleed. Call your doctor or health care professional if you notice any unusual bleeding. Be careful brushing and flossing your teeth or using a toothpick because you may get an infection or bleed more easily. If you have any dental work done, tell your dentist you are receiving this medicine. Avoid taking products that contain aspirin, acetaminophen, ibuprofen, naproxen, or ketoprofen unless instructed by your doctor. These medicines may hide a fever. Do not become pregnant while taking this medicine. Women should inform their doctor if they wish to become pregnant or think they might be pregnant. There is a potential for serious side effects to an unborn child. Talk to your health care professional or pharmacist for more information. Do not breast-feed an infant while taking this medicine. What side effects may I notice from receiving this medicine? Side effects that you should report to your doctor or health care professional as soon as possible: -allergic reactions like skin rash, itching or hives, swelling of the face, lips, or tongue -low blood counts - this medicine may decrease the number of white blood cells, red blood cells and platelets. You may be at increased risk for infections and bleeding. -signs of infection - fever or chills, cough, sore throat, pain or difficulty passing urine -signs of decreased platelets or bleeding - bruising, pinpoint red spots on the skin, black, tarry stools, blood in the urine -signs of decreased red blood cells - unusually weak or tired, fainting spells, lightheadedness -breathing problems -chest pain -diarrhea -feeling faint or lightheaded, falls -flushing, runny nose, sweating during infusion -mouth sores or pain -pain, swelling, redness or irritation where  injected -pain, swelling, warmth in the leg -pain, tingling, numbness in the hands or feet -problems with balance, talking, walking -stomach cramps, pain -trouble passing urine or change in the amount of urine -vomiting as to be unable to hold down drinks or food -yellowing of the eyes or skin Side effects that usually do not require medical attention (report to your doctor or health care professional if they continue or are bothersome): -constipation -hair loss -headache -loss of appetite -nausea, vomiting -stomach upset This list may not describe all possible side effects. Call your doctor for medical advice about side effects. You may report side effects to FDA at 1-800-FDA-1088. Where should I keep my medicine? This drug is given in a hospital or clinic and will not be stored at home. NOTE: This sheet is a summary. It may not cover all possible information. If you have questions about this medicine, talk to your doctor, pharmacist, or health care provider.    2016, [employer] Standard. (2012-09-04 16:29:32)  Leucovorin injection What is this medicine? LEUCOVORIN (loo koe VOR in) is used to prevent or treat the harmful effects of some medicines. This medicine is used to treat anemia caused by a low amount of folic acid in the body. It is also used with 5-fluorouracil (  5-FU) to treat colon cancer. This medicine may be used for other purposes; ask your health care provider or pharmacist if you have questions. What should I tell my health care provider before I take this medicine? They need to know if you have any of these conditions: -anemia from low levels of vitamin B-12 in the blood -an unusual or allergic reaction to leucovorin, folic acid, other medicines, foods, dyes, or preservatives -pregnant or trying to get pregnant -breast-feeding How should I use this medicine? This medicine is for injection into a muscle or into a vein. It is given by a health care professional in a  hospital or clinic setting. Talk to your pediatrician regarding the use of this medicine in children. Special care may be needed. Overdosage: If you think you have taken too much of this medicine contact a poison control center or emergency room at once. NOTE: This medicine is only for you. Do not share this medicine with others. What if I miss a dose? This does not apply. What may interact with this medicine? -capecitabine -fluorouracil -phenobarbital -phenytoin -primidone -trimethoprim-sulfamethoxazole This list may not describe all possible interactions. Give your health care provider a list of all the medicines, herbs, non-prescription drugs, or dietary supplements you use. Also tell them if you smoke, drink alcohol, or use illegal drugs. Some items may interact with your medicine. What should I watch for while using this medicine? Your condition will be monitored carefully while you are receiving this medicine. This medicine may increase the side effects of 5-fluorouracil, 5-FU. Tell your doctor or health care professional if you have diarrhea or mouth sores that do not get better or that get worse. What side effects may I notice from receiving this medicine? Side effects that you should report to your doctor or health care professional as soon as possible: -allergic reactions like skin rash, itching or hives, swelling of the face, lips, or tongue -breathing problems -fever, infection -mouth sores -unusual bleeding or bruising -unusually weak or tired Side effects that usually do not require medical attention (report to your doctor or health care professional if they continue or are bothersome): -constipation or diarrhea -loss of appetite -nausea, vomiting This list may not describe all possible side effects. Call your doctor for medical advice about side effects. You may report side effects to FDA at 1-800-FDA-1088. Where should I keep my medicine? This drug is given in a  hospital or clinic and will not be stored at home. NOTE: This sheet is a summary. It may not cover all possible information. If you have questions about this medicine, talk to your doctor, pharmacist, or health care provider.    2016, [employer] Standard. (2007-09-12 16:50:29)  Fluorouracil, 5-FU injection What is this medicine? FLUOROURACIL, 5-FU (flure oh YOOR a sil) is a chemotherapy drug. It slows the growth of cancer cells. This medicine is used to treat many types of cancer like breast cancer, colon or rectal cancer, pancreatic cancer, and stomach cancer. This medicine may be used for other purposes; ask your health care provider or pharmacist if you have questions. What should I tell my health care provider before I take this medicine? They need to know if you have any of these conditions: -blood disorders -dihydropyrimidine dehydrogenase (DPD) deficiency -infection (especially a virus infection such as chickenpox, cold sores, or herpes) -kidney disease -liver disease -malnourished, poor nutrition -recent or ongoing radiation therapy -an unusual or allergic reaction to fluorouracil, other chemotherapy, other medicines, foods, dyes, or preservatives -pregnant or  trying to get pregnant -breast-feeding How should I use this medicine? This drug is given as an infusion or injection into a vein. It is administered in a hospital or clinic by a specially trained health care professional. Talk to your pediatrician regarding the use of this medicine in children. Special care may be needed. Overdosage: If you think you have taken too much of this medicine contact a poison control center or emergency room at once. NOTE: This medicine is only for you. Do not share this medicine with others. What if I miss a dose? It is important not to miss your dose. Call your doctor or health care professional if you are unable to keep an appointment. What may interact with this  medicine? -allopurinol -cimetidine -dapsone -digoxin -hydroxyurea -leucovorin -levamisole -medicines for seizures like ethotoin, fosphenytoin, phenytoin -medicines to increase blood counts like filgrastim, pegfilgrastim, sargramostim -medicines that treat or prevent blood clots like warfarin, enoxaparin, and dalteparin -methotrexate -metronidazole -pyrimethamine -some other chemotherapy drugs like busulfan, cisplatin, estramustine, vinblastine -trimethoprim -trimetrexate -vaccines Talk to your doctor or health care professional before taking any of these medicines: -acetaminophen -aspirin -ibuprofen -ketoprofen -naproxen This list may not describe all possible interactions. Give your health care provider a list of all the medicines, herbs, non-prescription drugs, or dietary supplements you use. Also tell them if you smoke, drink alcohol, or use illegal drugs. Some items may interact with your medicine. What should I watch for while using this medicine? Visit your doctor for checks on your progress. This drug may make you feel generally unwell. This is not uncommon, as chemotherapy can affect healthy cells as well as cancer cells. Report any side effects. Continue your course of treatment even though you feel ill unless your doctor tells you to stop. In some cases, you may be given additional medicines to help with side effects. Follow all directions for their use. Call your doctor or health care professional for advice if you get a fever, chills or sore throat, or other symptoms of a cold or flu. Do not treat yourself. This drug decreases your body's ability to fight infections. Try to avoid being around people who are sick. This medicine may increase your risk to bruise or bleed. Call your doctor or health care professional if you notice any unusual bleeding. Be careful brushing and flossing your teeth or using a toothpick because you may get an infection or bleed more easily. If you  have any dental work done, tell your dentist you are receiving this medicine. Avoid taking products that contain aspirin, acetaminophen, ibuprofen, naproxen, or ketoprofen unless instructed by your doctor. These medicines may hide a fever. Do not become pregnant while taking this medicine. Women should inform their doctor if they wish to become pregnant or think they might be pregnant. There is a potential for serious side effects to an unborn child. Talk to your health care professional or pharmacist for more information. Do not breast-feed an infant while taking this medicine. Men should inform their doctor if they wish to father a child. This medicine may lower sperm counts. Do not treat diarrhea with over the counter products. Contact your doctor if you have diarrhea that lasts more than 2 days or if it is severe and watery. This medicine can make you more sensitive to the sun. Keep out of the sun. If you cannot avoid being in the sun, wear protective clothing and use sunscreen. Do not use sun lamps or tanning beds/booths. What side effects may I notice  from receiving this medicine? Side effects that you should report to your doctor or health care professional as soon as possible: -allergic reactions like skin rash, itching or hives, swelling of the face, lips, or tongue -low blood counts - this medicine may decrease the number of white blood cells, red blood cells and platelets. You may be at increased risk for infections and bleeding. -signs of infection - fever or chills, cough, sore throat, pain or difficulty passing urine -signs of decreased platelets or bleeding - bruising, pinpoint red spots on the skin, black, tarry stools, blood in the urine -signs of decreased red blood cells - unusually weak or tired, fainting spells, lightheadedness -breathing problems -changes in vision -chest pain -mouth sores -nausea and vomiting -pain, swelling, redness at site where injected -pain, tingling,  numbness in the hands or feet -redness, swelling, or sores on hands or feet -stomach pain -unusual bleeding Side effects that usually do not require medical attention (report to your doctor or health care professional if they continue or are bothersome): -changes in finger or toe nails -diarrhea -dry or itchy skin -hair loss -headache -loss of appetite -sensitivity of eyes to the light -stomach upset -unusually teary eyes This list may not describe all possible side effects. Call your doctor for medical advice about side effects. You may report side effects to FDA at 1-800-FDA-1088. Where should I keep my medicine? This drug is given in a hospital or clinic and will not be stored at home. NOTE: This sheet is a summary. It may not cover all possible information. If you have questions about this medicine, talk to your doctor, pharmacist, or health care provider.    2016, [employer] Standard. (2007-07-12 13:53:16)    [facility] Discharge Instructions for Patients Receiving Chemotherapy  Today you received the following chemotherapy agents:  Vectibix, Irinotecan, Leucovorin, Fluorouracil  To help prevent nausea and vomiting after your treatment, we encourage you to take your nausea medication as prescribed.   If you develop nausea and vomiting that is not controlled by your nausea medication, call the clinic.   BELOW ARE SYMPTOMS THAT SHOULD BE REPORTED IMMEDIATELY:  *FEVER GREATER THAN 100.5 F  *CHILLS WITH OR WITHOUT FEVER  NAUSEA AND VOMITING THAT IS NOT CONTROLLED WITH YOUR NAUSEA MEDICATION  *UNUSUAL SHORTNESS OF BREATH  *UNUSUAL BRUISING OR BLEEDING  TENDERNESS IN MOUTH AND THROAT WITH OR WITHOUT PRESENCE OF ULCERS  *URINARY PROBLEMS  *BOWEL PROBLEMS  UNUSUAL RASH Items with * indicate a potential emergency and should be followed up as soon as possible.  Feel free to call the clinic you have any questions or concerns. The clinic phone number  is [phone].  Please show the [place] at check-in to the Emergency Department and triage nurse.

## 2015-06-05 NOTE — Telephone Encounter (Signed)
Per staff message and POF I have scheduled appts. Advised scheduler of appts. JMW  

## 2015-06-05 NOTE — Telephone Encounter (Signed)
Gave and printed appt sched and avs for pt for march adn April

## 2015-06-05 NOTE — Progress Notes (Signed)
[facility] OFFICE PROGRESS NOTE   Diagnosis:  Rectal cancer  INTERVAL HISTORY:   Jacob [name] returns as scheduled. He completed cycle 1 FOLFIRI/PANITUMUMAB 05/21/2015. No significant nausea. No mouth sores though he did note some gum sensitivity. Mild diarrhea. Rash over the face/scalp and trunk. He denies rectal bleeding. No shortness of breath or cough. No hemoptysis.  Objective:  Vital signs in last 24 hours:  Blood pressure 113/76, pulse 72, temperature 99.6 F (37.6 C), temperature source Oral, resp. rate 18, height 5' 10" (1.778 m), weight 156 lb (70.761 kg), SpO2 98 %.    HEENT: No thrush or ulcers. Resp: Lungs clear bilaterally. Cardio: Regular rate and rhythm. GI: Abdomen soft and nontender. No hepatomegaly. Vascular: No leg edema.  Skin: Acne-like rash over the face/scalp and trunk. Port-A-Cath without erythema.    Lab Results:  Lab Results  Component Value Date   WBC 6.7 06/05/2015   HGB 13.3 06/05/2015   HCT 39.3 06/05/2015   MCV 88.9 06/05/2015   PLT 140 06/05/2015   NEUTROABS 5.1 06/05/2015    Imaging:  No results found.  Medications: I have reviewed the patient's current medications.  Assessment/Plan: 1. Rectal cancer. Partially obstructing mass noted 1-2 cm from the anal verge on a colonoscopy 03/06/2012. Endoscopic ultrasound 03/14/2012 with a 7.5 mm thick, 3.2 cm wide hypoechoic, irregularly bordered mass that clearly passed into and through the muscularis propria layer of the distal rectal wall (uT3); 3 small (largest 7 mm) perirectal lymph nodes. The lymph nodes were all round, discrete, hypoechoic, homogenous; suspicious for malignant involvement (uN1).  No RAS mutation identified by [facility] 1 testing on the colon resection specimen 06/29/2012, APC alteration identified, microsatellite stable  He began radiation and concurrent Xeloda chemotherapy on 03/20/2012, completed 04/27/2012.   Low anterior resection/coloanal  anastomosis and diverting ileostomy 06/29/2012 with the final pathology revealing a T2N0 tumor with extensive fibrosis and negative margins.   Cycle 1 of adjuvant CAPOX 07/19/2012. Cycle 5 of adjuvant CAPOX 10/11/2012.   CEA 2.5 on 11/14/2012.   CEA 14.6 03/08/2013.   Restaging CT evaluation 03/08/2013 with a 4 mm pulmonary nodule left lung base not identified on comparison exam; new liver lesions including a 29 x 26 mm irregular peripheral enhancing rounded lesion in the dome of the right hepatic lobe, a less well-defined new subcapsular lesion in the lateral right hepatic lobe measuring 12 mm, a new subcapsular lesion in the anterior right hepatic lobe adjacent to the gallbladder fossa measuring 10 mm; a rounded low-density lesion in the inferior right hepatic lobe measuring 10 mm compared to 7 mm on the prior study (radiologist commented this may represent an enlarged cyst).   MRI of the abdomen 04/12/2013 confirmed multiple T2 hyperintense metastatic lesions throughout the right liver   Initiation of FOLFIRI/Avastin with genotype based irinotecan dosing per the [facility] study 04/05/2013.   Restaging CT evaluation on 06/20/2013 (after 2 cycles/4 treatments) showed improvement in the liver metastases and stable size of a left lower lobe pulmonary nodule now with central cavitation.   Continuation of FOLFIRI/Avastin.   Restaging CT evaluation 08/14/2013-decrease in the size of liver metastases, slight decrease in the size of a cavitary left lower lobe nodule, no evidence of disease progression.   Status post right hepatic lobectomy 09/17/2013. Pathology showed multiple foci of metastatic adenocarcinoma (5 nodules of metastatic adenocarcinoma 4 of which are subcapsular with the nodules ranging in size from 0.6-1.8 cm in greatest dimension). Margins not involved. Biopsy of a portal lymph   node showed benign adipose tissue; no lymph node tissue or malignancy.   Normal CEA 11/06/2013   CT  11/06/2013 with a new pleural-based right lower chest lesion, slight in enlargement of the a left-sided lung nodule  CT 01/29/2014 with a decrease in the right lower chest pleural-based lesion and a slight increase of a left-sided lung lesion, other lung lesions were stable  CT chest 05/02/2014 with a decrease in the size of a right lower lobe pulmonary nodule months similar size of a dominant left lower lobe nodule, minimal enlargement of a smaller left lower lobe nodule, no new site of disease  CT chest 11/07/2014 with a slight increase in several left-sided lung nodules  CT chest 02/10/2015 with new small left hilar lymph nodes, a possible new left lower lobe nodule, and stability of other lung nodules  PET scan 03/12/2015 with hypermetabolic left hilar nodes, hypermetabolic left lower lobe nodules, hyper metabolic retroperitoneal nodes, intense hypermetabolism at the coloanal anastomosis, and hypermetabolic thickening in the presacral space  Cycle 1 FOLFIRI/PANITUMUMAB 05/21/2015  Cycle 2 FOLFIRI/PANITUMUMAB 06/05/2015 2. Irregular bowel habits/rectal bleeding secondary to #1. 3. History of Mild elevation of the liver enzymes. Question secondary to hepatic steatosis. 4. Indeterminate 8 mm posterior right liver lesion on the staging CT 03/06/2012. 5. Mildly elevated CEA at 5.7 on 03/06/2012. 6. History of radiation erythema at the groin and perineum 7. 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 No clinical evidence of thrombophlebitis or extravasation. 8. Delayed nausea following chemotherapy-Decadron prophylaxis was added with cycle 3 CAPOX-improved. 9. Ileostomy takedown 12/07/2012. 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 11. Port-A-Cath placement 12/31/204 12. Severe neutropenia secondary to chemotherapy following cycle 1 of FOLFIRI, chemotherapy was dose reduced and he received Neulasta with day 15  cycle 1  13. Nausea and vomiting following cycle 1 of FOLFIRI 14. Rectal stricture 15. Leukopenia/Thrombocytopenia-persistent, potentially a sequelae of chemotherapy or hepatic toxicity from chemotherapy/radiation. Bone marrow biopsy 11/20/2014 showed cellular bone marrow with trilineage hematopoiesis. Significant dyspoiesis was not present and there was no evidence of metastatic carcinoma. Cytogenetic analysis showed the presence of normal male chromosomes with no observable clonal chromosomal abnormalities.  probable cirrhosis 16. Genetic testing-negative genetic panel in March 2014 17. 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 He continues minocycline.   Disposition: Jacob [name] appears stable. He has completed 1 cycle of FOLFIRI/PANITUMUMAB. Plan to proceed with cycle 2 today as scheduled.  He has developed a rash secondary to PANITUMUMAB. He will continue minocycline.  He will return for a follow-up visit and cycle 3 FOLFIRI/PANITUMUMAB in 2 weeks. He will contact the office in the interim with any problems.  He is being screened for the MET amplified study. His life expectancy is greater than 3 months. ECOG performance status 0. No clinically significant gastrointestinal bleeding within the last 6 months. No hemoptysis or other signs indicative of pulmonary hemorrhage within the last 3 months.  Plan reviewed with Dr. Sherrill.  ,  ANP/GNP-BC   06/05/2015  11:19 AM         

## 2015-06-06 LAB — CEA: CEA1: 2.6 ng/mL (ref 0.0–4.7)

## 2015-06-06 LAB — CEA (PARALLEL TESTING): CEA: 1.6 ng/mL

## 2015-06-07 ENCOUNTER — Ambulatory Visit (HOSPITAL_BASED_OUTPATIENT_CLINIC_OR_DEPARTMENT_OTHER): Payer: BC Managed Care – PPO

## 2015-06-07 VITALS — BP 117/60 | HR 66 | Temp 97.7°F | Resp 16

## 2015-06-07 DIAGNOSIS — C787 Secondary malignant neoplasm of liver and intrahepatic bile duct: Secondary | ICD-10-CM | POA: Diagnosis not present

## 2015-06-07 DIAGNOSIS — Z5189 Encounter for other specified aftercare: Secondary | ICD-10-CM | POA: Diagnosis not present

## 2015-06-07 DIAGNOSIS — C20 Malignant neoplasm of rectum: Secondary | ICD-10-CM

## 2015-06-07 MED ORDER — HEPARIN SOD (PORK) LOCK FLUSH 100 UNIT/ML IV SOLN
500.0000 [IU] | Freq: Once | INTRAVENOUS | Status: AC | PRN
  Administered 2015-06-07: 500 [IU]
  Filled 2015-06-07: qty 5

## 2015-06-07 MED ORDER — PEGFILGRASTIM INJECTION 6 MG/0.6ML ~~LOC~~
6.0000 mg | PREFILLED_SYRINGE | Freq: Once | SUBCUTANEOUS | Status: AC
Start: 2015-06-07 — End: 2015-06-07
  Administered 2015-06-07: 6 mg via SUBCUTANEOUS

## 2015-06-07 MED ORDER — SODIUM CHLORIDE 0.9 % IJ SOLN
10.0000 mL | INTRAMUSCULAR | Status: DC | PRN
  Administered 2015-06-07: 10 mL
  Filled 2015-06-07: qty 10

## 2015-06-15 ENCOUNTER — Other Ambulatory Visit: Payer: Self-pay | Admitting: Oncology

## 2015-06-19 ENCOUNTER — Other Ambulatory Visit (HOSPITAL_BASED_OUTPATIENT_CLINIC_OR_DEPARTMENT_OTHER): Payer: BC Managed Care – PPO

## 2015-06-19 ENCOUNTER — Ambulatory Visit (HOSPITAL_BASED_OUTPATIENT_CLINIC_OR_DEPARTMENT_OTHER): Payer: BC Managed Care – PPO | Admitting: Nurse Practitioner

## 2015-06-19 ENCOUNTER — Ambulatory Visit (HOSPITAL_BASED_OUTPATIENT_CLINIC_OR_DEPARTMENT_OTHER): Payer: BC Managed Care – PPO

## 2015-06-19 ENCOUNTER — Telehealth: Payer: Self-pay | Admitting: Oncology

## 2015-06-19 VITALS — BP 122/67 | HR 67 | Temp 98.4°F | Resp 18 | Wt 149.1 lb

## 2015-06-19 DIAGNOSIS — Z5111 Encounter for antineoplastic chemotherapy: Secondary | ICD-10-CM

## 2015-06-19 DIAGNOSIS — R21 Rash and other nonspecific skin eruption: Secondary | ICD-10-CM | POA: Diagnosis not present

## 2015-06-19 DIAGNOSIS — C787 Secondary malignant neoplasm of liver and intrahepatic bile duct: Secondary | ICD-10-CM | POA: Diagnosis not present

## 2015-06-19 DIAGNOSIS — C20 Malignant neoplasm of rectum: Secondary | ICD-10-CM

## 2015-06-19 DIAGNOSIS — Z5112 Encounter for antineoplastic immunotherapy: Secondary | ICD-10-CM | POA: Diagnosis not present

## 2015-06-19 DIAGNOSIS — K5900 Constipation, unspecified: Secondary | ICD-10-CM | POA: Diagnosis not present

## 2015-06-19 LAB — COMPREHENSIVE METABOLIC PANEL
ALT: 43 U/L (ref 0–55)
AST: 22 U/L (ref 5–34)
Albumin: 3.4 g/dL — ABNORMAL LOW (ref 3.5–5.0)
Alkaline Phosphatase: 97 U/L (ref 40–150)
Anion Gap: 8 mEq/L (ref 3–11)
BUN: 8.3 mg/dL (ref 7.0–26.0)
CO2: 28 meq/L (ref 22–29)
Calcium: 9 mg/dL (ref 8.4–10.4)
Chloride: 107 mEq/L (ref 98–109)
Creatinine: 0.9 mg/dL (ref 0.7–1.3)
GLUCOSE: 76 mg/dL (ref 70–140)
POTASSIUM: 3.9 meq/L (ref 3.5–5.1)
SODIUM: 143 meq/L (ref 136–145)
TOTAL PROTEIN: 6.6 g/dL (ref 6.4–8.3)
Total Bilirubin: <0.3 mg/dL (ref 0.20–1.20)

## 2015-06-19 LAB — CBC WITH DIFFERENTIAL/PLATELET
BASO%: 0.4 % (ref 0.0–2.0)
Basophils Absolute: 0 10*3/uL (ref 0.0–0.1)
EOS%: 0.9 % (ref 0.0–7.0)
Eosinophils Absolute: 0.1 10*3/uL (ref 0.0–0.5)
HCT: 40.6 % (ref 38.4–49.9)
HEMOGLOBIN: 13.3 g/dL (ref 13.0–17.1)
LYMPH%: 12.2 % — AB (ref 14.0–49.0)
MCH: 29.6 pg (ref 27.2–33.4)
MCHC: 32.8 g/dL (ref 32.0–36.0)
MCV: 90.2 fL (ref 79.3–98.0)
MONO#: 0.9 10*3/uL (ref 0.1–0.9)
MONO%: 10.9 % (ref 0.0–14.0)
NEUT%: 75.6 % — ABNORMAL HIGH (ref 39.0–75.0)
NEUTROS ABS: 6.4 10*3/uL (ref 1.5–6.5)
Platelets: 122 10*3/uL — ABNORMAL LOW (ref 140–400)
RBC: 4.5 10*6/uL (ref 4.20–5.82)
RDW: 14.5 % (ref 11.0–14.6)
WBC: 8.5 10*3/uL (ref 4.0–10.3)
lymph#: 1 10*3/uL (ref 0.9–3.3)

## 2015-06-19 LAB — MAGNESIUM: Magnesium: 2.6 mg/dl — ABNORMAL HIGH (ref 1.5–2.5)

## 2015-06-19 MED ORDER — LEUCOVORIN CALCIUM INJECTION 350 MG
400.0000 mg/m2 | Freq: Once | INTRAVENOUS | Status: AC
  Administered 2015-06-19: 764 mg via INTRAVENOUS
  Filled 2015-06-19: qty 38.2

## 2015-06-19 MED ORDER — SODIUM CHLORIDE 0.9 % IV SOLN
Freq: Once | INTRAVENOUS | Status: AC
  Administered 2015-06-19: 10:00:00 via INTRAVENOUS

## 2015-06-19 MED ORDER — SODIUM CHLORIDE 0.9 % IV SOLN
6.0000 mg/kg | Freq: Once | INTRAVENOUS | Status: AC
  Administered 2015-06-19: 440 mg via INTRAVENOUS
  Filled 2015-06-19: qty 17

## 2015-06-19 MED ORDER — PALONOSETRON HCL INJECTION 0.25 MG/5ML
INTRAVENOUS | Status: AC
  Filled 2015-06-19: qty 5

## 2015-06-19 MED ORDER — FLUOROURACIL CHEMO INJECTION 2.5 GM/50ML
400.0000 mg/m2 | Freq: Once | INTRAVENOUS | Status: AC
  Administered 2015-06-19: 750 mg via INTRAVENOUS
  Filled 2015-06-19: qty 15

## 2015-06-19 MED ORDER — FLUTICASONE PROPIONATE 0.05 % EX CREA: TOPICAL_CREAM | Freq: Two times a day (BID) | CUTANEOUS | Status: DC

## 2015-06-19 MED ORDER — IRINOTECAN HCL CHEMO INJECTION 100 MG/5ML
135.0000 mg/m2 | Freq: Once | INTRAVENOUS | Status: AC
  Administered 2015-06-19: 258 mg via INTRAVENOUS
  Filled 2015-06-19: qty 8.9

## 2015-06-19 MED ORDER — SODIUM CHLORIDE 0.9 % IV SOLN
2400.0000 mg/m2 | INTRAVENOUS | Status: DC
  Administered 2015-06-19: 4600 mg via INTRAVENOUS
  Filled 2015-06-19: qty 92

## 2015-06-19 MED ORDER — SODIUM CHLORIDE 0.9 % IV SOLN: Freq: Once | INTRAVENOUS | Status: DC

## 2015-06-19 MED ORDER — SODIUM CHLORIDE 0.9 % IV SOLN
Freq: Once | INTRAVENOUS | Status: AC
  Administered 2015-06-19: 10:00:00 via INTRAVENOUS
  Filled 2015-06-19: qty 5

## 2015-06-19 MED ORDER — PALONOSETRON HCL INJECTION 0.25 MG/5ML
0.2500 mg | Freq: Once | INTRAVENOUS | Status: AC
  Administered 2015-06-19: 0.25 mg via INTRAVENOUS

## 2015-06-19 NOTE — Progress Notes (Signed)
[facility] OFFICE PROGRESS NOTE   Diagnosis:  Rectal cancer  INTERVAL HISTORY:   Jacob [name] returns as scheduled. He completed cycle 2 FOLFIRI/PANITUMUMAB 06/05/2015. He had mild nausea for a few days. He had a few mouth sores which were not severe. He continues to note that his mouth becomes "sensitive" following the chemotherapy. No problem with eating or drinking. He becomes constipated for a few days after the chemotherapy and then develops loose stools. He feels better following the loose stools. The rash has increased. This coincides with discontinuing minocycline. He notes the rash is particularly bad around his mouth. One week ago he had an episode of chest pain/shortness of breath lasting 3 hours. Symptoms felt somewhat like "acid reflux". He has had similar symptoms in the past. He felt "gas" discomfort. No further occurrences. No leg swelling or calf pain.  Objective:  Vital signs in last 24 hours:  Blood pressure 122/67, pulse 67, temperature 98.4 F (36.9 C), temperature source Oral, resp. rate 18, weight 149 lb 2 oz (67.643 kg), SpO2 100 %.    HEENT:  No thrush or ulcers. Resp:  Lungs clear bilaterally. Cardio:  Regular rate and rhythm. GI:  Abdomen soft and nontender. No hepatomegaly. Vascular:  No leg edema. Calves soft and nontender. Skin:  Acne-like rash over the face /scalp and trunk. Crusted appearing rash below the lower lip. Port-A-Cath without erythema    Lab Results:  Lab Results  Component Value Date   WBC 8.5 06/19/2015   HGB 13.3 06/19/2015   HCT 40.6 06/19/2015   MCV 90.2 06/19/2015   PLT 122* 06/19/2015   NEUTROABS 6.4 06/19/2015    Imaging:  No results found.  Medications: I have reviewed the patient's current medications.  Assessment/Plan: 1. Rectal cancer. Partially obstructing mass noted 1-2 cm from the anal verge on a colonoscopy 03/06/2012. Endoscopic ultrasound 03/14/2012 with a 7.5 mm thick, 3.2 cm wide hypoechoic,  irregularly bordered mass that clearly passed into and through the muscularis propria layer of the distal rectal wall (uT3); 3 small (largest 7 mm) perirectal lymph nodes. The lymph nodes were all round, discrete, hypoechoic, homogenous; suspicious for malignant involvement (uN1).  No RAS mutation identified by [facility] 1 testing on the colon resection specimen 06/29/2012, APC alteration identified, microsatellite stable  He began radiation and concurrent Xeloda chemotherapy on 03/20/2012, completed 04/27/2012.   Low anterior resection/coloanal anastomosis and diverting ileostomy 06/29/2012 with the final pathology revealing a T2N0 tumor with extensive fibrosis and negative margins.   Cycle 1 of adjuvant CAPOX 07/19/2012. Cycle 5 of adjuvant CAPOX 10/11/2012.   CEA 2.5 on 11/14/2012.   CEA 14.6 03/08/2013.   Restaging CT evaluation 03/08/2013 with a 4 mm pulmonary nodule left lung base not identified on comparison exam; new liver lesions including a 29 x 26 mm irregular peripheral enhancing rounded lesion in the dome of the right hepatic lobe, a less well-defined new subcapsular lesion in the lateral right hepatic lobe measuring 12 mm, a new subcapsular lesion in the anterior right hepatic lobe adjacent to the gallbladder fossa measuring 10 mm; a rounded low-density lesion in the inferior right hepatic lobe measuring 10 mm compared to 7 mm on the prior study (radiologist commented this may represent an enlarged cyst).   MRI of the abdomen 04/12/2013 confirmed multiple T2 hyperintense metastatic lesions throughout the right liver   Initiation of FOLFIRI/Avastin with genotype based irinotecan dosing per the [facility] study 04/05/2013.   Restaging CT evaluation on 06/20/2013 (after 2 cycles/4  treatments) showed improvement in the liver metastases and stable size of a left lower lobe pulmonary nodule now with central cavitation.   Continuation of FOLFIRI/Avastin.   Restaging CT evaluation  08/14/2013-decrease in the size of liver metastases, slight decrease in the size of a cavitary left lower lobe nodule, no evidence of disease progression.   Status post right hepatic lobectomy 09/17/2013. Pathology showed multiple foci of metastatic adenocarcinoma (5 nodules of metastatic adenocarcinoma 4 of which are subcapsular with the nodules ranging in size from 0.6-1.8 cm in greatest dimension). Margins not involved. Biopsy of a portal lymph node showed benign adipose tissue; no lymph node tissue or malignancy.   Normal CEA 11/06/2013   CT 11/06/2013 with a new pleural-based right lower chest lesion, slight in enlargement of the a left-sided lung nodule  CT 01/29/2014 with a decrease in the right lower chest pleural-based lesion and a slight increase of a left-sided lung lesion, other lung lesions were stable  CT chest 05/02/2014 with a decrease in the size of a right lower lobe pulmonary nodule months similar size of a dominant left lower lobe nodule, minimal enlargement of a smaller left lower lobe nodule, no new site of disease  CT chest 11/07/2014 with a slight increase in several left-sided lung nodules  CT chest 02/10/2015 with new small left hilar lymph nodes, a possible new left lower lobe nodule, and stability of other lung nodules  PET scan 03/12/2015 with hypermetabolic left hilar nodes, hypermetabolic left lower lobe nodules, hyper metabolic retroperitoneal nodes, intense hypermetabolism at the coloanal anastomosis, and hypermetabolic thickening in the presacral space  Cycle 1 FOLFIRI/PANITUMUMAB 05/21/2015  Cycle 2 FOLFIRI/PANITUMUMAB 06/05/2015  Cycle 3 FOLFIRI/PANITUMUMAB 06/19/2015 2. Irregular bowel habits/rectal bleeding secondary to #1. 3. History of Mild elevation of the liver enzymes. Question secondary to hepatic steatosis. 4. Indeterminate 8 mm posterior right liver lesion on the staging CT 03/06/2012. 5. Mildly elevated CEA at 5.7 on 03/06/2012. 6. History  of radiation erythema at the groin and perineum 7. 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 No clinical evidence of thrombophlebitis or extravasation. 8. Delayed nausea following chemotherapy-Decadron prophylaxis was added with cycle 3 CAPOX-improved. 9. Ileostomy takedown 12/07/2012. 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 11. Port-A-Cath placement 12/31/204 12. Severe neutropenia secondary to chemotherapy following cycle 1 of FOLFIRI, chemotherapy was dose reduced and he received Neulasta with day 15 cycle 1  13. Nausea and vomiting following cycle 1 of FOLFIRI 14. Rectal stricture 15. Leukopenia/Thrombocytopenia-persistent, potentially a sequelae of chemotherapy or hepatic toxicity from chemotherapy/radiation. Bone marrow biopsy 11/20/2014 showed cellular bone marrow with trilineage hematopoiesis. Significant dyspoiesis was not present and there was no evidence of metastatic carcinoma. Cytogenetic analysis showed the presence of normal male chromosomes with no observable clonal chromosomal abnormalities.  probable cirrhosis 16. Genetic testing-negative genetic panel in March 2014 17. 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    Disposition: Mr. Volner appears stable. He has completed 2 cycles of FOLFIRI/PANITUMUMAB. Plan to proceed with cycle 3 today as scheduled.  The skin rash has increased since he discontinued minocycline. He will resume minocycline. I sent a prescription to his pharmacy for fluticasone 0.05% to apply to affected area twice daily.  The episode of chest pain/shortness of breath last week may have been related to acid reflux. He will begin an over-the-counter acid reducer. He understands to contact the office if symptoms recur. He has had similar pain in the past which he relates to acid reflux.  For the constipation  following chemotherapy he will begin a laxative regimen.  He will return for  a follow-up visit and cycle 4 FOLFIRI/PANITUMUMAB in 2 weeks.  Plan reviewed with Dr. Benay Spice. 25 minutes were spent face-to-face at today's visit with the majority of that time involved in counseling/coordination of care.  Ned Card ANP/GNP-BC   06/19/2015  9:18 AM

## 2015-06-19 NOTE — Patient Instructions (Signed)
    [facility] Discharge Instructions for Patients Receiving Chemotherapy  Today you received the following chemotherapy agents:  Vectibix, Irinotecan, Leucovorin, Fluorouracil  To help prevent nausea and vomiting after your treatment, we encourage you to take your nausea medication as prescribed.   If you develop nausea and vomiting that is not controlled by your nausea medication, call the clinic.   BELOW ARE SYMPTOMS THAT SHOULD BE REPORTED IMMEDIATELY:  *FEVER GREATER THAN 100.5 F  *CHILLS WITH OR WITHOUT FEVER  NAUSEA AND VOMITING THAT IS NOT CONTROLLED WITH YOUR NAUSEA MEDICATION  *UNUSUAL SHORTNESS OF BREATH  *UNUSUAL BRUISING OR BLEEDING  TENDERNESS IN MOUTH AND THROAT WITH OR WITHOUT PRESENCE OF ULCERS  *URINARY PROBLEMS  *BOWEL PROBLEMS  UNUSUAL RASH Items with * indicate a potential emergency and should be followed up as soon as possible.  Feel free to call the clinic you have any questions or concerns. The clinic phone number is [phone].  Please show the [place] at check-in to the Emergency Department and triage nurse.

## 2015-06-19 NOTE — Telephone Encounter (Signed)
Gave and printed appt sched and avs for pt for March thru April

## 2015-06-21 ENCOUNTER — Ambulatory Visit (HOSPITAL_BASED_OUTPATIENT_CLINIC_OR_DEPARTMENT_OTHER): Payer: BC Managed Care – PPO

## 2015-06-21 ENCOUNTER — Ambulatory Visit: Payer: BC Managed Care – PPO

## 2015-06-21 VITALS — BP 126/63 | HR 56 | Temp 98.0°F | Resp 17 | Ht 70.0 in

## 2015-06-21 DIAGNOSIS — C20 Malignant neoplasm of rectum: Secondary | ICD-10-CM

## 2015-06-21 DIAGNOSIS — C787 Secondary malignant neoplasm of liver and intrahepatic bile duct: Secondary | ICD-10-CM | POA: Diagnosis not present

## 2015-06-21 DIAGNOSIS — Z5189 Encounter for other specified aftercare: Secondary | ICD-10-CM

## 2015-06-21 MED ORDER — PEGFILGRASTIM INJECTION 6 MG/0.6ML ~~LOC~~
6.0000 mg | PREFILLED_SYRINGE | Freq: Once | SUBCUTANEOUS | Status: AC
  Administered 2015-06-21: 6 mg via SUBCUTANEOUS

## 2015-06-21 MED ORDER — SODIUM CHLORIDE 0.9 % IJ SOLN
10.0000 mL | INTRAMUSCULAR | Status: DC | PRN
  Administered 2015-06-21: 10 mL
  Filled 2015-06-21: qty 10

## 2015-06-21 MED ORDER — HEPARIN SOD (PORK) LOCK FLUSH 100 UNIT/ML IV SOLN
500.0000 [IU] | Freq: Once | INTRAVENOUS | Status: AC | PRN
  Administered 2015-06-21: 500 [IU]
  Filled 2015-06-21: qty 5

## 2015-06-29 ENCOUNTER — Other Ambulatory Visit: Payer: Self-pay | Admitting: Oncology

## 2015-07-03 ENCOUNTER — Other Ambulatory Visit: Payer: Self-pay | Admitting: *Deleted

## 2015-07-03 ENCOUNTER — Ambulatory Visit (HOSPITAL_BASED_OUTPATIENT_CLINIC_OR_DEPARTMENT_OTHER): Payer: BC Managed Care – PPO | Admitting: Oncology

## 2015-07-03 ENCOUNTER — Telehealth: Payer: Self-pay | Admitting: *Deleted

## 2015-07-03 ENCOUNTER — Other Ambulatory Visit (HOSPITAL_BASED_OUTPATIENT_CLINIC_OR_DEPARTMENT_OTHER): Payer: BC Managed Care – PPO

## 2015-07-03 ENCOUNTER — Ambulatory Visit: Payer: BC Managed Care – PPO

## 2015-07-03 ENCOUNTER — Telehealth: Payer: Self-pay | Admitting: Nurse Practitioner

## 2015-07-03 VITALS — BP 135/73 | HR 72 | Temp 98.4°F | Resp 18 | Ht 70.0 in | Wt 149.2 lb

## 2015-07-03 DIAGNOSIS — R21 Rash and other nonspecific skin eruption: Secondary | ICD-10-CM

## 2015-07-03 DIAGNOSIS — C20 Malignant neoplasm of rectum: Secondary | ICD-10-CM | POA: Diagnosis not present

## 2015-07-03 DIAGNOSIS — E876 Hypokalemia: Secondary | ICD-10-CM

## 2015-07-03 DIAGNOSIS — R197 Diarrhea, unspecified: Secondary | ICD-10-CM | POA: Diagnosis not present

## 2015-07-03 DIAGNOSIS — C787 Secondary malignant neoplasm of liver and intrahepatic bile duct: Secondary | ICD-10-CM

## 2015-07-03 LAB — CBC WITH DIFFERENTIAL/PLATELET
BASO%: 0.8 % (ref 0.0–2.0)
Basophils Absolute: 0.1 10*3/uL (ref 0.0–0.1)
EOS%: 0.8 % (ref 0.0–7.0)
Eosinophils Absolute: 0.1 10*3/uL (ref 0.0–0.5)
HEMATOCRIT: 41.1 % (ref 38.4–49.9)
HGB: 13.8 g/dL (ref 13.0–17.1)
LYMPH#: 1.5 10*3/uL (ref 0.9–3.3)
LYMPH%: 10.6 % — AB (ref 14.0–49.0)
MCH: 29.9 pg (ref 27.2–33.4)
MCHC: 33.6 g/dL (ref 32.0–36.0)
MCV: 89.2 fL (ref 79.3–98.0)
MONO#: 1.2 10*3/uL — ABNORMAL HIGH (ref 0.1–0.9)
MONO%: 8.4 % (ref 0.0–14.0)
NEUT#: 11.4 10*3/uL — ABNORMAL HIGH (ref 1.5–6.5)
NEUT%: 79.4 % — AB (ref 39.0–75.0)
Platelets: 119 10*3/uL — ABNORMAL LOW (ref 140–400)
RBC: 4.61 10*6/uL (ref 4.20–5.82)
RDW: 15.3 % — ABNORMAL HIGH (ref 11.0–14.6)
WBC: 14.4 10*3/uL — ABNORMAL HIGH (ref 4.0–10.3)

## 2015-07-03 LAB — COMPREHENSIVE METABOLIC PANEL
ALT: 39 U/L (ref 0–55)
AST: 22 U/L (ref 5–34)
Albumin: 3.4 g/dL — ABNORMAL LOW (ref 3.5–5.0)
Alkaline Phosphatase: 99 U/L (ref 40–150)
Anion Gap: 10 mEq/L (ref 3–11)
BUN: 8 mg/dL (ref 7.0–26.0)
CHLORIDE: 108 meq/L (ref 98–109)
CO2: 24 meq/L (ref 22–29)
CREATININE: 0.8 mg/dL (ref 0.7–1.3)
Calcium: 8.9 mg/dL (ref 8.4–10.4)
EGFR: >90 mL/min/{1.73_m2} (ref >90)
GLUCOSE: 88 mg/dL (ref 70–140)
Potassium: 3 mEq/L — CL (ref 3.5–5.1)
Sodium: 142 mEq/L (ref 136–145)
Total Protein: 7 g/dL (ref 6.4–8.3)

## 2015-07-03 MED ORDER — POTASSIUM CHLORIDE CRYS ER 20 MEQ PO TBCR: EXTENDED_RELEASE_TABLET | ORAL | Status: DC

## 2015-07-03 MED ORDER — METHYLPREDNISOLONE 4 MG PO TBPK: ORAL_TABLET | ORAL | Status: DC

## 2015-07-03 NOTE — Progress Notes (Signed)
[facility] OFFICE PROGRESS NOTE   Diagnosis: Rectal cancer  INTERVAL HISTORY:   Jacob [name] returns as scheduled. He completed another cycle of FOLFIRI/panitumumab on 06/19/2015. No nausea. He continues to have diarrhea. He is not taking Imodium consistently. He is drinking adequate fluids. He complains of a rash over the scalp, face, trunk, and extremities. The rash over the face is crusted. Fluticasone cream did not help. He tried coconut oil this week. He developed desquamation at the lips, this has improved. He has a few ulcers at the fingertips.  Objective:  Vital signs in last 24 hours:  Blood pressure 135/73, pulse 72, temperature 98.4 F (36.9 C), temperature source Oral, resp. rate 18, height '5\' 10"'  (1.778 m), weight 149 lb 3.2 oz (67.677 kg), SpO2 100 %.    HEENT: Erythema at the inner lips, oral cavity without ulcers or thrush Resp: Lungs clear bilaterally Cardio: Regular rate and rhythm GI: No hepatomegaly, nontender Vascular: No leg edema Skin: Few linear ulcers surrounding the fingernails, acne type rash over the chest greater than back and upper thighs. Crusted lesions diffusely over the face   Portacath/PICC-without erythema  Lab Results:  Lab Results  Component Value Date   WBC 14.4* 07/03/2015   HGB 13.8 07/03/2015   HCT 41.1 07/03/2015   MCV 89.2 07/03/2015   PLT 119* 07/03/2015   NEUTROABS 11.4* 07/03/2015   Potassium 3.0, creatinine 0.8  Medications: I have reviewed the patient's current medications.  Assessment/Plan: 1. Rectal cancer. Partially obstructing mass noted 1-2 cm from the anal verge on a colonoscopy 03/06/2012. Endoscopic ultrasound 03/14/2012 with a 7.5 mm thick, 3.2 cm wide hypoechoic, irregularly bordered mass that clearly passed into and through the muscularis propria layer of the distal rectal wall (uT3); 3 small (largest 7 mm) perirectal lymph nodes. The lymph nodes were all round, discrete, hypoechoic, homogenous;  suspicious for malignant involvement (uN1).  No RAS mutation identified by [facility] 1 testing on the colon resection specimen 06/29/2012, APC alteration identified, microsatellite stable  He began radiation and concurrent Xeloda chemotherapy on 03/20/2012, completed 04/27/2012.   Low anterior resection/coloanal anastomosis and diverting ileostomy 06/29/2012 with the final pathology revealing a T2N0 tumor with extensive fibrosis and negative margins.   Cycle 1 of adjuvant CAPOX 07/19/2012. Cycle 5 of adjuvant CAPOX 10/11/2012.   CEA 2.5 on 11/14/2012.   CEA 14.6 03/08/2013.   Restaging CT evaluation 03/08/2013 with a 4 mm pulmonary nodule left lung base not identified on comparison exam; new liver lesions including a 29 x 26 mm irregular peripheral enhancing rounded lesion in the dome of the right hepatic lobe, a less well-defined new subcapsular lesion in the lateral right hepatic lobe measuring 12 mm, a new subcapsular lesion in the anterior right hepatic lobe adjacent to the gallbladder fossa measuring 10 mm; a rounded low-density lesion in the inferior right hepatic lobe measuring 10 mm compared to 7 mm on the prior study (radiologist commented this may represent an enlarged cyst).   MRI of the abdomen 04/12/2013 confirmed multiple T2 hyperintense metastatic lesions throughout the right liver   Initiation of FOLFIRI/Avastin with genotype based irinotecan dosing per the [facility] study 04/05/2013.   Restaging CT evaluation on 06/20/2013 (after 2 cycles/4 treatments) showed improvement in the liver metastases and stable size of a left lower lobe pulmonary nodule now with central cavitation.   Continuation of FOLFIRI/Avastin.   Restaging CT evaluation 08/14/2013-decrease in the size of liver metastases, slight decrease in the size of a cavitary left lower  lobe nodule, no evidence of disease progression.   Status post right hepatic lobectomy 09/17/2013. Pathology showed multiple foci  of metastatic adenocarcinoma (5 nodules of metastatic adenocarcinoma 4 of which are subcapsular with the nodules ranging in size from 0.6-1.8 cm in greatest dimension). Margins not involved. Biopsy of a portal lymph node showed benign adipose tissue; no lymph node tissue or malignancy.   Normal CEA 11/06/2013   CT 11/06/2013 with a new pleural-based right lower chest lesion, slight in enlargement of the a left-sided lung nodule  CT 01/29/2014 with a decrease in the right lower chest pleural-based lesion and a slight increase of a left-sided lung lesion, other lung lesions were stable  CT chest 05/02/2014 with a decrease in the size of a right lower lobe pulmonary nodule months similar size of a dominant left lower lobe nodule, minimal enlargement of a smaller left lower lobe nodule, no new site of disease  CT chest 11/07/2014 with a slight increase in several left-sided lung nodules  CT chest 02/10/2015 with new small left hilar lymph nodes, a possible new left lower lobe nodule, and stability of other lung nodules  PET scan 03/12/2015 with hypermetabolic left hilar nodes, hypermetabolic left lower lobe nodules, hyper metabolic retroperitoneal nodes, intense hypermetabolism at the coloanal anastomosis, and hypermetabolic thickening in the presacral space  Cycle 1 FOLFIRI/PANITUMUMAB 05/21/2015  Cycle 2 FOLFIRI/PANITUMUMAB 06/05/2015  Cycle 3 FOLFIRI/PANITUMUMAB 06/19/2015 2. Irregular bowel habits/rectal bleeding secondary to #1. 3. History of Mild elevation of the liver enzymes. Question secondary to hepatic steatosis. 4. Indeterminate 8 mm posterior right liver lesion on the staging CT 03/06/2012. 5. Mildly elevated CEA at 5.7 on 03/06/2012. 6. History of radiation erythema at the groin and perineum 7. 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 No clinical evidence of thrombophlebitis or  extravasation. 8. Delayed nausea following chemotherapy-Decadron prophylaxis was added with cycle 3 CAPOX-improved. 9. Ileostomy takedown 12/07/2012. 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 11. Port-A-Cath placement 12/31/204 12. Severe neutropenia secondary to chemotherapy following cycle 1 of FOLFIRI, chemotherapy was dose reduced and he received Neulasta with day 15 cycle 1  13. Nausea and vomiting following cycle 1 of FOLFIRI 14. Rectal stricture 15. Leukopenia/Thrombocytopenia-persistent, potentially a sequelae of chemotherapy or hepatic toxicity from chemotherapy/radiation. Bone marrow biopsy 11/20/2014 showed cellular bone marrow with trilineage hematopoiesis. Significant dyspoiesis was not present and there was no evidence of metastatic carcinoma. Cytogenetic analysis showed the presence of normal male chromosomes with no observable clonal chromosomal abnormalities.  probable cirrhosis 16. Genetic testing-negative genetic panel in March 2014 17. 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 Severe over the face-steroid Dosepak prescribed 07/03/2015 18. Diarrhea secondary to chemotherapy-encouraged to use Imodium 07/03/2015   Disposition:  Jacob [name] has completed 3 treatments with FOLFIRI/panitumumab. The most recent cycle of chemotherapy was complicated by persistent diarrhea. He has developed hypokalemia. The potassium will be repleted and we encouraged him to use Imodium regularly.  He has a severe panitumumab skin rash over the face. Fluticasone cream did not help. We prescribed a Medrol Dosepak today. Chemotherapy will be held today. Mr. Cygan will return for an office visit and planned FOLFIRI/panitumumab in one week.  He declined intravenous fluids today. Betsy Coder, MD  07/03/2015  9:48 AM

## 2015-07-03 NOTE — Telephone Encounter (Signed)
per pof to sch pt appt-sent MW email to sch trmt-pt to get updated copy on MY CHART-per pt req

## 2015-07-03 NOTE — Telephone Encounter (Signed)
Per staff message and POF I have scheduled appts. Advised scheduler of appts. JMW  

## 2015-07-04 LAB — CEA: CEA: 2.4 ng/mL (ref 0.0–4.7)

## 2015-07-05 ENCOUNTER — Ambulatory Visit: Payer: BC Managed Care – PPO

## 2015-07-10 ENCOUNTER — Ambulatory Visit: Payer: BC Managed Care – PPO

## 2015-07-10 ENCOUNTER — Telehealth: Payer: Self-pay | Admitting: *Deleted

## 2015-07-10 ENCOUNTER — Ambulatory Visit (HOSPITAL_BASED_OUTPATIENT_CLINIC_OR_DEPARTMENT_OTHER): Payer: BC Managed Care – PPO | Admitting: Nurse Practitioner

## 2015-07-10 ENCOUNTER — Other Ambulatory Visit (HOSPITAL_BASED_OUTPATIENT_CLINIC_OR_DEPARTMENT_OTHER): Payer: BC Managed Care – PPO

## 2015-07-10 ENCOUNTER — Ambulatory Visit (HOSPITAL_BASED_OUTPATIENT_CLINIC_OR_DEPARTMENT_OTHER): Payer: BC Managed Care – PPO

## 2015-07-10 ENCOUNTER — Ambulatory Visit: Payer: BC Managed Care – PPO | Admitting: Nutrition

## 2015-07-10 ENCOUNTER — Telehealth: Payer: Self-pay | Admitting: Hematology

## 2015-07-10 ENCOUNTER — Telehealth: Payer: Self-pay | Admitting: Nurse Practitioner

## 2015-07-10 VITALS — BP 133/80 | HR 82 | Temp 98.2°F | Resp 18 | Ht 70.0 in | Wt 148.6 lb

## 2015-07-10 DIAGNOSIS — R197 Diarrhea, unspecified: Secondary | ICD-10-CM

## 2015-07-10 DIAGNOSIS — C787 Secondary malignant neoplasm of liver and intrahepatic bile duct: Secondary | ICD-10-CM

## 2015-07-10 DIAGNOSIS — C20 Malignant neoplasm of rectum: Secondary | ICD-10-CM

## 2015-07-10 DIAGNOSIS — L270 Generalized skin eruption due to drugs and medicaments taken internally: Secondary | ICD-10-CM

## 2015-07-10 DIAGNOSIS — Z5111 Encounter for antineoplastic chemotherapy: Secondary | ICD-10-CM | POA: Diagnosis not present

## 2015-07-10 LAB — COMPREHENSIVE METABOLIC PANEL
ALBUMIN: 3.4 g/dL — AB (ref 3.5–5.0)
ALK PHOS: 86 U/L (ref 40–150)
ALT: 36 U/L (ref 0–55)
ANION GAP: 7 meq/L (ref 3–11)
AST: 20 U/L (ref 5–34)
BILIRUBIN TOTAL: 0.38 mg/dL (ref 0.20–1.20)
BUN: 10 mg/dL (ref 7.0–26.0)
CO2: 29 meq/L (ref 22–29)
Calcium: 9.3 mg/dL (ref 8.4–10.4)
Chloride: 106 mEq/L (ref 98–109)
Creatinine: 0.8 mg/dL (ref 0.7–1.3)
EGFR: >90 mL/min/{1.73_m2} (ref >90)
Glucose: 83 mg/dl (ref 70–140)
Potassium: 3.7 mEq/L (ref 3.5–5.1)
Sodium: 141 mEq/L (ref 136–145)
TOTAL PROTEIN: 6.5 g/dL (ref 6.4–8.3)

## 2015-07-10 LAB — CBC WITH DIFFERENTIAL/PLATELET
BASO%: 0.1 % (ref 0.0–2.0)
BASOS ABS: 0 10*3/uL (ref 0.0–0.1)
EOS ABS: 0.2 10*3/uL (ref 0.0–0.5)
EOS%: 1.5 % (ref 0.0–7.0)
HCT: 41.6 % (ref 38.4–49.9)
HEMOGLOBIN: 13.8 g/dL (ref 13.0–17.1)
LYMPH%: 9.9 % — AB (ref 14.0–49.0)
MCH: 30 pg (ref 27.2–33.4)
MCHC: 33.2 g/dL (ref 32.0–36.0)
MCV: 90.4 fL (ref 79.3–98.0)
MONO#: 0.8 10*3/uL (ref 0.1–0.9)
MONO%: 8 % (ref 0.0–14.0)
NEUT#: 8.3 10*3/uL — ABNORMAL HIGH (ref 1.5–6.5)
NEUT%: 80.5 % — AB (ref 39.0–75.0)
Platelets: 115 10*3/uL — ABNORMAL LOW (ref 140–400)
RBC: 4.6 10*6/uL (ref 4.20–5.82)
RDW: 16.8 % — AB (ref 11.0–14.6)
WBC: 10.3 10*3/uL (ref 4.0–10.3)
lymph#: 1 10*3/uL (ref 0.9–3.3)

## 2015-07-10 LAB — MAGNESIUM: Magnesium: 2.4 mg/dl (ref 1.5–2.5)

## 2015-07-10 MED ORDER — SODIUM CHLORIDE 0.9 % IV SOLN
2400.0000 mg/m2 | INTRAVENOUS | Status: AC
  Administered 2015-07-10: 4600 mg via INTRAVENOUS
  Filled 2015-07-10: qty 92

## 2015-07-10 MED ORDER — DEXTROSE 5 % IV SOLN
400.0000 mg/m2 | Freq: Once | INTRAVENOUS | Status: AC
  Administered 2015-07-10: 764 mg via INTRAVENOUS
  Filled 2015-07-10: qty 38.2

## 2015-07-10 MED ORDER — SODIUM CHLORIDE 0.9 % IV SOLN
Freq: Once | INTRAVENOUS | Status: AC
  Administered 2015-07-10: 12:00:00 via INTRAVENOUS

## 2015-07-10 MED ORDER — SODIUM CHLORIDE 0.9% FLUSH
10.0000 mL | INTRAVENOUS | Status: DC | PRN
  Filled 2015-07-10: qty 10

## 2015-07-10 MED ORDER — SODIUM CHLORIDE 0.9 % IV SOLN
Freq: Once | INTRAVENOUS | Status: AC
  Administered 2015-07-10: 12:00:00 via INTRAVENOUS
  Filled 2015-07-10: qty 5

## 2015-07-10 MED ORDER — PALONOSETRON HCL INJECTION 0.25 MG/5ML
0.2500 mg | Freq: Once | INTRAVENOUS | Status: AC
  Administered 2015-07-10: 0.25 mg via INTRAVENOUS

## 2015-07-10 MED ORDER — LORAZEPAM 1 MG PO TABS
ORAL_TABLET | ORAL | Status: AC
  Filled 2015-07-10: qty 1

## 2015-07-10 MED ORDER — IRINOTECAN HCL CHEMO INJECTION 100 MG/5ML
135.0000 mg/m2 | Freq: Once | INTRAVENOUS | Status: AC
  Administered 2015-07-10: 258 mg via INTRAVENOUS
  Filled 2015-07-10: qty 9

## 2015-07-10 MED ORDER — ATROPINE SULFATE 1 MG/ML IJ SOLN
INTRAMUSCULAR | Status: AC
  Filled 2015-07-10: qty 1

## 2015-07-10 MED ORDER — FLUOROURACIL CHEMO INJECTION 2.5 GM/50ML
400.0000 mg/m2 | Freq: Once | INTRAVENOUS | Status: AC
  Administered 2015-07-10: 750 mg via INTRAVENOUS
  Filled 2015-07-10: qty 15

## 2015-07-10 MED ORDER — ATROPINE SULFATE 1 MG/ML IJ SOLN: 0.5000 mg | Freq: Once | INTRAMUSCULAR | Status: DC | PRN

## 2015-07-10 MED ORDER — LORAZEPAM 1 MG PO TABS
0.5000 mg | ORAL_TABLET | Freq: Once | ORAL | Status: AC
  Administered 2015-07-10: 0.5 mg via ORAL

## 2015-07-10 NOTE — Patient Instructions (Signed)
[place] Discharge Instructions for Patients Receiving Chemotherapy  Today you received the following chemotherapy agents:  Irinotecan, 5FU, and Leucovorin.  To help prevent nausea and vomiting after your treatment, we encourage you to take your nausea medication as prescribed.   If you develop nausea and vomiting that is not controlled by your nausea medication, call the clinic.   BELOW ARE SYMPTOMS THAT SHOULD BE REPORTED IMMEDIATELY:  *FEVER GREATER THAN 100.5 F  *CHILLS WITH OR WITHOUT FEVER  NAUSEA AND VOMITING THAT IS NOT CONTROLLED WITH YOUR NAUSEA MEDICATION  *UNUSUAL SHORTNESS OF BREATH  *UNUSUAL BRUISING OR BLEEDING  TENDERNESS IN MOUTH AND THROAT WITH OR WITHOUT PRESENCE OF ULCERS  *URINARY PROBLEMS  *BOWEL PROBLEMS  UNUSUAL RASH Items with * indicate a potential emergency and should be followed up as soon as possible.  Feel free to call the clinic you have any questions or concerns. The clinic phone number is [phone].  Please show the [place] at check-in to the Emergency Department and triage nurse.

## 2015-07-10 NOTE — Telephone Encounter (Signed)
mail box is full unable to leave message.Marland Kitchen appt will be mailed to pt

## 2015-07-10 NOTE — Telephone Encounter (Signed)
Per staff message and POF I have scheduled appts. Advised scheduler of appts. JMW  

## 2015-07-10 NOTE — Progress Notes (Signed)
45 year old male diagnosed with metastatic rectal cancer.  He is a patient of Dr. Julieanne Manson.  Past medical history and labs were reviewed.  Medications include Xanax, Colace, Medrol Dosepak, K-Dur, Compazine.  Height: 5 feet 10 inches. Weight: 148.6 pounds. Usual body weight: 190 pounds in 2013 at time of diagnosis. BMI: 21.32  RN requested nutrition consult today while patient was receiving chemotherapy. Patient reports problems with his bowels and states he has constipation and diarrhea. Patient reports he often goes for long periods of time without eating secondary to loose stools. Patient appears to be avoiding seeds and nuts.  He does not consume a lot of dairy products. Patient has had continual weight loss throughout treatment.  Nutrition diagnosis:  Food and nutrition related knowledge deficit related to metastatic rectal cancer as evidenced by no prior need for nutrition related information.  Intervention:  I educated patient on the importance of adequate calories and protein to minimize further weight loss. Encouraged patient to eat more frequent meals consistently throughout the day. Encouraged patient makes smoothies using almond milk and allowed fruits. Recommended patient try oral nutrition supplements between meals to add additional calories and protein.  I provided coupons. Encouraged increased fluids throughout the day. Fact sheets were given.  Questions were answered.  Teach back method was used.  Monitoring, evaluation, goals: Patient will tolerate adequate calories and protein to minimize further weight loss.  Next visit: Patient will contact me for questions or concerns.  **Disclaimer: This note was dictated with voice recognition software. Similar sounding words can inadvertently be transcribed and this note may contain transcription errors which may not have been corrected upon publication of note.**

## 2015-07-10 NOTE — Progress Notes (Addendum)
[place] OFFICE PROGRESS NOTE   Diagnosis:  Colon cancer  INTERVAL HISTORY:   Mr. Vora returns as scheduled. He has completed 3 cycles of FOLFIRI/PANITUMUMAB. Cycle 4 was held on 07/03/2015 due to a severe PANITUMUMAB skin rash over the face. A Medrol Dosepak was prescribed.  The rash overall is better but he continues to develop new lesions. He has persistent pain and drainage from around the nail beds. No nausea or vomiting. No mouth sores. He continues to have loose stools. No fever.  Objective:  Vital signs in last 24 hours:  Blood pressure 133/80, pulse 82, temperature 98.2 F (36.8 C), temperature source Oral, resp. rate 18, height '5\' 10"'  (1.778 m), weight 148 lb 9.6 oz (67.405 kg), SpO2 100 %.    HEENT: No thrush or ulcers. Resp: Lungs clear bilaterally. Cardio: Regular rate and rhythm. GI: Abdomen soft and nontender. No hepatomegaly. Vascular: No leg edema. Skin: Extensive acne type rash over the face, chest and back. Multiple crusted lesions scattered over the face. Multiple fingernail beds with mild erythema, tenderness. Port-A-Cath without erythema.  Lab Results:  Lab Results  Component Value Date   WBC 10.3 07/10/2015   HGB 13.8 07/10/2015   HCT 41.6 07/10/2015   MCV 90.4 07/10/2015   PLT 115* 07/10/2015   NEUTROABS 8.3* 07/10/2015    Imaging:  No results found.  Medications: I have reviewed the patient's current medications.  Assessment/Plan: 1. Rectal cancer. Partially obstructing mass noted 1-2 cm from the anal verge on a colonoscopy 03/06/2012. Endoscopic ultrasound 03/14/2012 with a 7.5 mm thick, 3.2 cm wide hypoechoic, irregularly bordered mass that clearly passed into and through the muscularis propria layer of the distal rectal wall (uT3); 3 small (largest 7 mm) perirectal lymph nodes. The lymph nodes were all round, discrete, hypoechoic, homogenous; suspicious for malignant involvement (uN1).  No RAS mutation identified by  [facility] 1 testing on the colon resection specimen 06/29/2012, APC alteration identified, microsatellite stable  He began radiation and concurrent Xeloda chemotherapy on 03/20/2012, completed 04/27/2012.   Low anterior resection/coloanal anastomosis and diverting ileostomy 06/29/2012 with the final pathology revealing a T2N0 tumor with extensive fibrosis and negative margins.   Cycle 1 of adjuvant CAPOX 07/19/2012. Cycle 5 of adjuvant CAPOX 10/11/2012.   CEA 2.5 on 11/14/2012.   CEA 14.6 03/08/2013.   Restaging CT evaluation 03/08/2013 with a 4 mm pulmonary nodule left lung base not identified on comparison exam; new liver lesions including a 29 x 26 mm irregular peripheral enhancing rounded lesion in the dome of the right hepatic lobe, a less well-defined new subcapsular lesion in the lateral right hepatic lobe measuring 12 mm, a new subcapsular lesion in the anterior right hepatic lobe adjacent to the gallbladder fossa measuring 10 mm; a rounded low-density lesion in the inferior right hepatic lobe measuring 10 mm compared to 7 mm on the prior study (radiologist commented this may represent an enlarged cyst).   MRI of the abdomen 04/12/2013 confirmed multiple T2 hyperintense metastatic lesions throughout the right liver   Initiation of FOLFIRI/Avastin with genotype based irinotecan dosing per the [facility] study 04/05/2013.   Restaging CT evaluation on 06/20/2013 (after 2 cycles/4 treatments) showed improvement in the liver metastases and stable size of a left lower lobe pulmonary nodule now with central cavitation.   Continuation of FOLFIRI/Avastin.   Restaging CT evaluation 08/14/2013-decrease in the size of liver metastases, slight decrease in the size of a cavitary left lower lobe nodule, no evidence of disease progression.  Status post right hepatic lobectomy 09/17/2013. Pathology showed multiple foci of metastatic adenocarcinoma (5 nodules of metastatic adenocarcinoma 4 of  which are subcapsular with the nodules ranging in size from 0.6-1.8 cm in greatest dimension). Margins not involved. Biopsy of a portal lymph node showed benign adipose tissue; no lymph node tissue or malignancy.   Normal CEA 11/06/2013   CT 11/06/2013 with a new pleural-based right lower chest lesion, slight in enlargement of the a left-sided lung nodule  CT 01/29/2014 with a decrease in the right lower chest pleural-based lesion and a slight increase of a left-sided lung lesion, other lung lesions were stable  CT chest 05/02/2014 with a decrease in the size of a right lower lobe pulmonary nodule months similar size of a dominant left lower lobe nodule, minimal enlargement of a smaller left lower lobe nodule, no new site of disease  CT chest 11/07/2014 with a slight increase in several left-sided lung nodules  CT chest 02/10/2015 with new small left hilar lymph nodes, a possible new left lower lobe nodule, and stability of other lung nodules  PET scan 03/12/2015 with hypermetabolic left hilar nodes, hypermetabolic left lower lobe nodules, hyper metabolic retroperitoneal nodes, intense hypermetabolism at the coloanal anastomosis, and hypermetabolic thickening in the presacral space  Cycle 1 FOLFIRI/PANITUMUMAB 05/21/2015  Cycle 2 FOLFIRI/PANITUMUMAB 06/05/2015  Cycle 3 FOLFIRI/PANITUMUMAB 06/19/2015 2. Irregular bowel habits/rectal bleeding secondary to #1. 3. History of Mild elevation of the liver enzymes. Question secondary to hepatic steatosis. 4. Indeterminate 8 mm posterior right liver lesion on the staging CT 03/06/2012. 5. Mildly elevated CEA at 5.7 on 03/06/2012. 6. History of radiation erythema at the groin and perineum 7. 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 No clinical evidence of thrombophlebitis or extravasation. 8. Delayed nausea following chemotherapy-Decadron prophylaxis was  added with cycle 3 CAPOX-improved. 9. Ileostomy takedown 12/07/2012. 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 11. Port-A-Cath placement 12/31/204 12. Severe neutropenia secondary to chemotherapy following cycle 1 of FOLFIRI, chemotherapy was dose reduced and he received Neulasta with day 15 cycle 1  13. Nausea and vomiting following cycle 1 of FOLFIRI 14. Rectal stricture 15. Leukopenia/Thrombocytopenia-persistent, potentially a sequelae of chemotherapy or hepatic toxicity from chemotherapy/radiation. Bone marrow biopsy 11/20/2014 showed cellular bone marrow with trilineage hematopoiesis. Significant dyspoiesis was not present and there was no evidence of metastatic carcinoma. Cytogenetic analysis showed the presence of normal male chromosomes with no observable clonal chromosomal abnormalities.  probable cirrhosis 16. Genetic testing-negative genetic panel in March 2014 17. 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 Severe over the face-steroid Dosepak prescribed 07/03/2015 18. Diarrhea secondary to chemotherapy-encouraged to use Imodium 07/03/2015   Disposition: Mr. Bossi has completed 3 cycles of FOLFIRI/PANITUMUMAB. Cycle 4 was held last week due to a severe PANITUMUMAB skin rash. He completed a Medrol Dosepak. The rash overall is better. However, he continues to develop new lesions most noticeable on the face. The plan is to proceed with cycle 4 FOLFIRI today as scheduled. We will continue to hold PANITUMUMAB due to the extensive rash. He will continue minocycline and fluticasone.  He will return for a follow-up visit in 2 weeks. He will contact the office in the interim with any problems.  Patient seen with Dr. Benay Spice. 25 minutes were spent face-to-face at today's visit with the majority of that time involved in counseling/coordination of care.    Ned Card ANP/GNP-BC   07/10/2015  10:37 AM  This was a shared visit with Ned Card. The skin rash has  improved. Panitumumab will remain on hold.  He will complete another cycle of FOLFIRI today. Mr. Platte will return for an office visit and chemotherapy in 2 weeks.  Julieanne Manson, M.D.

## 2015-07-10 NOTE — Progress Notes (Unsigned)
Patient complains of nausea. Selena Lesser, NP notified. Order given and carried out for Ativan 0.5 mg PO. Patient's spouse driving.   Patient reports he feels better since taking the Ativan.

## 2015-07-10 NOTE — Progress Notes (Signed)
Pt wanted labs drawn from arm instead of port. Pt arrived to MD appointment per [facility] staff

## 2015-07-12 ENCOUNTER — Ambulatory Visit: Payer: BC Managed Care – PPO

## 2015-07-12 ENCOUNTER — Ambulatory Visit (HOSPITAL_BASED_OUTPATIENT_CLINIC_OR_DEPARTMENT_OTHER): Payer: BC Managed Care – PPO

## 2015-07-12 VITALS — BP 124/83 | HR 74 | Temp 97.6°F | Resp 16

## 2015-07-12 DIAGNOSIS — Z5189 Encounter for other specified aftercare: Secondary | ICD-10-CM | POA: Diagnosis not present

## 2015-07-12 DIAGNOSIS — C787 Secondary malignant neoplasm of liver and intrahepatic bile duct: Secondary | ICD-10-CM

## 2015-07-12 DIAGNOSIS — C20 Malignant neoplasm of rectum: Secondary | ICD-10-CM

## 2015-07-12 MED ORDER — HEPARIN SOD (PORK) LOCK FLUSH 100 UNIT/ML IV SOLN
500.0000 [IU] | Freq: Once | INTRAVENOUS | Status: AC | PRN
  Administered 2015-07-12: 500 [IU]
  Filled 2015-07-12: qty 5

## 2015-07-12 MED ORDER — PEGFILGRASTIM INJECTION 6 MG/0.6ML ~~LOC~~
6.0000 mg | PREFILLED_SYRINGE | Freq: Once | SUBCUTANEOUS | Status: AC
  Administered 2015-07-12: 6 mg via SUBCUTANEOUS

## 2015-07-12 MED ORDER — SODIUM CHLORIDE 0.9 % IJ SOLN
10.0000 mL | INTRAMUSCULAR | Status: DC | PRN
  Administered 2015-07-12: 10 mL
  Filled 2015-07-12: qty 10

## 2015-07-12 NOTE — Patient Instructions (Signed)

## 2015-07-12 NOTE — Progress Notes (Signed)
Documentation for Neulasta injection and pump stop has been done under injection encounter.

## 2015-07-17 ENCOUNTER — Other Ambulatory Visit: Payer: BC Managed Care – PPO

## 2015-07-17 ENCOUNTER — Ambulatory Visit: Payer: BC Managed Care – PPO | Admitting: Nurse Practitioner

## 2015-07-17 ENCOUNTER — Ambulatory Visit: Payer: BC Managed Care – PPO

## 2015-07-19 ENCOUNTER — Ambulatory Visit: Payer: BC Managed Care – PPO

## 2015-07-20 ENCOUNTER — Other Ambulatory Visit: Payer: Self-pay | Admitting: Oncology

## 2015-07-24 ENCOUNTER — Ambulatory Visit (HOSPITAL_BASED_OUTPATIENT_CLINIC_OR_DEPARTMENT_OTHER): Payer: BC Managed Care – PPO | Admitting: Nurse Practitioner

## 2015-07-24 ENCOUNTER — Telehealth: Payer: Self-pay | Admitting: *Deleted

## 2015-07-24 ENCOUNTER — Telehealth: Payer: Self-pay | Admitting: Oncology

## 2015-07-24 ENCOUNTER — Other Ambulatory Visit (HOSPITAL_BASED_OUTPATIENT_CLINIC_OR_DEPARTMENT_OTHER): Payer: BC Managed Care – PPO

## 2015-07-24 ENCOUNTER — Ambulatory Visit (HOSPITAL_BASED_OUTPATIENT_CLINIC_OR_DEPARTMENT_OTHER): Payer: BC Managed Care – PPO

## 2015-07-24 ENCOUNTER — Encounter: Payer: Self-pay | Admitting: *Deleted

## 2015-07-24 VITALS — BP 115/64 | HR 75 | Temp 98.2°F | Resp 20 | Ht 70.0 in | Wt 159.6 lb

## 2015-07-24 DIAGNOSIS — L270 Generalized skin eruption due to drugs and medicaments taken internally: Secondary | ICD-10-CM | POA: Diagnosis not present

## 2015-07-24 DIAGNOSIS — Z5111 Encounter for antineoplastic chemotherapy: Secondary | ICD-10-CM | POA: Diagnosis not present

## 2015-07-24 DIAGNOSIS — C787 Secondary malignant neoplasm of liver and intrahepatic bile duct: Secondary | ICD-10-CM

## 2015-07-24 DIAGNOSIS — C20 Malignant neoplasm of rectum: Secondary | ICD-10-CM

## 2015-07-24 LAB — CBC WITH DIFFERENTIAL/PLATELET
BASO%: 1 % (ref 0.0–2.0)
BASOS ABS: 0 10*3/uL (ref 0.0–0.1)
EOS ABS: 0.1 10*3/uL (ref 0.0–0.5)
EOS%: 4.1 % (ref 0.0–7.0)
HEMATOCRIT: 35 % — AB (ref 38.4–49.9)
HEMOGLOBIN: 11.3 g/dL — AB (ref 13.0–17.1)
LYMPH#: 0.5 10*3/uL — AB (ref 0.9–3.3)
LYMPH%: 15 % (ref 14.0–49.0)
MCH: 29.3 pg (ref 27.2–33.4)
MCHC: 32.2 g/dL (ref 32.0–36.0)
MCV: 91.2 fL (ref 79.3–98.0)
MONO#: 0.4 10*3/uL (ref 0.1–0.9)
MONO%: 10.6 % (ref 0.0–14.0)
NEUT#: 2.4 10*3/uL (ref 1.5–6.5)
NEUT%: 69.3 % (ref 39.0–75.0)
PLATELETS: 116 10*3/uL — AB (ref 140–400)
RBC: 3.84 10*6/uL — ABNORMAL LOW (ref 4.20–5.82)
RDW: 18 % — AB (ref 11.0–14.6)
WBC: 3.5 10*3/uL — ABNORMAL LOW (ref 4.0–10.3)

## 2015-07-24 LAB — COMPREHENSIVE METABOLIC PANEL
ALT: 29 U/L (ref 0–55)
AST: 18 U/L (ref 5–34)
Albumin: 2.9 g/dL — ABNORMAL LOW (ref 3.5–5.0)
Alkaline Phosphatase: 73 U/L (ref 40–150)
Anion Gap: 8 mEq/L (ref 3–11)
BUN: 11 mg/dL (ref 7.0–26.0)
CHLORIDE: 111 meq/L — AB (ref 98–109)
CO2: 25 meq/L (ref 22–29)
Calcium: 8.3 mg/dL — ABNORMAL LOW (ref 8.4–10.4)
Creatinine: 0.9 mg/dL (ref 0.7–1.3)
GLUCOSE: 103 mg/dL (ref 70–140)
POTASSIUM: 3.5 meq/L (ref 3.5–5.1)
SODIUM: 144 meq/L (ref 136–145)
Total Bilirubin: <0.3 mg/dL (ref 0.20–1.20)
Total Protein: 5.6 g/dL — ABNORMAL LOW (ref 6.4–8.3)

## 2015-07-24 LAB — MAGNESIUM: Magnesium: 2.3 mg/dl (ref 1.5–2.5)

## 2015-07-24 MED ORDER — PALONOSETRON HCL INJECTION 0.25 MG/5ML
INTRAVENOUS | Status: AC
  Filled 2015-07-24: qty 5

## 2015-07-24 MED ORDER — IRINOTECAN HCL CHEMO INJECTION 100 MG/5ML
136.0000 mg/m2 | Freq: Once | INTRAVENOUS | Status: AC
  Administered 2015-07-24: 260 mg via INTRAVENOUS
  Filled 2015-07-24: qty 10

## 2015-07-24 MED ORDER — FOSAPREPITANT DIMEGLUMINE INJECTION 150 MG
Freq: Once | INTRAVENOUS | Status: AC
  Administered 2015-07-24: 10:00:00 via INTRAVENOUS
  Filled 2015-07-24: qty 5

## 2015-07-24 MED ORDER — PALONOSETRON HCL INJECTION 0.25 MG/5ML
0.2500 mg | Freq: Once | INTRAVENOUS | Status: AC
  Administered 2015-07-24: 0.25 mg via INTRAVENOUS

## 2015-07-24 MED ORDER — FLUOROURACIL CHEMO INJECTION 2.5 GM/50ML
400.0000 mg/m2 | Freq: Once | INTRAVENOUS | Status: AC
  Administered 2015-07-24: 750 mg via INTRAVENOUS
  Filled 2015-07-24: qty 15

## 2015-07-24 MED ORDER — LEUCOVORIN CALCIUM INJECTION 350 MG
398.0000 mg/m2 | Freq: Once | INTRAVENOUS | Status: AC
  Administered 2015-07-24: 760 mg via INTRAVENOUS
  Filled 2015-07-24: qty 38

## 2015-07-24 MED ORDER — ATROPINE SULFATE 1 MG/ML IJ SOLN
INTRAMUSCULAR | Status: AC
  Filled 2015-07-24: qty 1

## 2015-07-24 MED ORDER — SODIUM CHLORIDE 0.9 % IV SOLN
2400.0000 mg/m2 | INTRAVENOUS | Status: DC
  Administered 2015-07-24: 4600 mg via INTRAVENOUS
  Filled 2015-07-24: qty 92

## 2015-07-24 MED ORDER — SODIUM CHLORIDE 0.9 % IV SOLN
Freq: Once | INTRAVENOUS | Status: AC
  Administered 2015-07-24: 10:00:00 via INTRAVENOUS

## 2015-07-24 NOTE — Patient Instructions (Signed)
[place] Discharge Instructions for Patients Receiving Chemotherapy  Today you received the following chemotherapy agents:  Fluorouracil, Leucovorin, Irinotecan  To help prevent nausea and vomiting after your treatment, we encourage you to take your nausea medication as prescribed.   If you develop nausea and vomiting that is not controlled by your nausea medication, call the clinic.   BELOW ARE SYMPTOMS THAT SHOULD BE REPORTED IMMEDIATELY:  *FEVER GREATER THAN 100.5 F  *CHILLS WITH OR WITHOUT FEVER  NAUSEA AND VOMITING THAT IS NOT CONTROLLED WITH YOUR NAUSEA MEDICATION  *UNUSUAL SHORTNESS OF BREATH  *UNUSUAL BRUISING OR BLEEDING  TENDERNESS IN MOUTH AND THROAT WITH OR WITHOUT PRESENCE OF ULCERS  *URINARY PROBLEMS  *BOWEL PROBLEMS  UNUSUAL RASH Items with * indicate a potential emergency and should be followed up as soon as possible.  Feel free to call the clinic you have any questions or concerns. The clinic phone number is [phone].  Please show the [place] at check-in to the Emergency Department and triage nurse.

## 2015-07-24 NOTE — Telephone Encounter (Signed)
Per staff message and POF I have scheduled appts. Advised scheduler of appts. JMW  

## 2015-07-24 NOTE — Telephone Encounter (Signed)
per pfo to sch pt appt-sent MW email to sch trmt-pt to get updated copy b4 leaving trmt

## 2015-07-24 NOTE — Progress Notes (Signed)
Oncology Nurse Navigator Documentation  Oncology Nurse Navigator Flowsheets 07/24/2015  Navigator Location [facility]-Med Onc  Navigator Encounter Type Treatment  Telephone -  Patient Visit Type MedOnc  Treatment Phase Treatment  Barriers/Navigation Needs Financial;Education  Education Symptom Management;Concerns with Insurance Coverage for [employer] Testing  Interventions Education Method;Other--provided contact information and financial assistance application for [employer]   Referrals -  Education Method Verbal--encourage him to soak or use warm compress 20 minutes tid to help with the paronychia in his fingers; try to wear loose fitting shorts for his groin rash when possible.  Support Groups/Services GI Support Group;Other--Living with Cancer Group  Acuity Level 1  Time Spent with Patient 30  Informed him not be be concerned about the cost of the testing until he gets an actual bill from [employer] will work towards appeals for the testing and then contact them prior to billing to discuss payment. Provided website to go on to see how the billing process works.

## 2015-07-24 NOTE — Progress Notes (Addendum)
[place] OFFICE PROGRESS NOTE   Diagnosis:  Rectal cancer  INTERVAL HISTORY:   Jacob [name] returns as scheduled. He completed cycle 4 FOLFIRI 07/10/2015. PANITUMUMAB was held due to a severe rash. He denies nausea/vomiting. No mouth sores. Diarrhea was controlled with Imodium. After the last treatment the skin rash worsened in the groin. The rash is now improving. He has pain around several nailbeds on his fingers.  Objective:  Vital signs in last 24 hours:  Blood pressure 115/64, pulse 75, temperature 98.2 F (36.8 C), temperature source Oral, resp. rate 20, height _0  (1.778 m), weight 159 lb 9.6 oz (72.394 kg), SpO2 100 %.    HEENT: No thrush or ulcers. Resp: Lungs clear bilaterally. Cardio: Regular rate and rhythm. GI: Abdomen soft and nontender. No hepatomegaly. Vascular: No leg edema. Skin: Several finger nail beds with mild erythema, tenderness. Acne type rash face, trunk.  Port-A-Cath without erythema.  Lab Results:  Lab Results  Component Value Date   WBC 3.5* 07/24/2015   HGB 11.3* 07/24/2015   HCT 35.0* 07/24/2015   MCV 91.2 07/24/2015   PLT 116* 07/24/2015   NEUTROABS 2.4 07/24/2015    Imaging:  No results found.  Medications: I have reviewed the patient's current medications.  Assessment/Plan: 1. Rectal cancer. Partially obstructing mass noted 1-2 cm from the anal verge on a colonoscopy 03/06/2012. Endoscopic ultrasound 03/14/2012 with a 7.5 mm thick, 3.2 cm wide hypoechoic, irregularly bordered mass that clearly passed into and through the muscularis propria layer of the distal rectal wall (uT3); 3 small (largest 7 mm) perirectal lymph nodes. The lymph nodes were all round, discrete, hypoechoic, homogenous; suspicious for malignant involvement (uN1).  No RAS mutation identified by [facility] 1 testing on the colon resection specimen 06/29/2012, APC alteration identified, microsatellite stable  He began radiation and concurrent Xeloda  chemotherapy on 03/20/2012, completed 04/27/2012.   Low anterior resection/coloanal anastomosis and diverting ileostomy 06/29/2012 with the final pathology revealing a T2N0 tumor with extensive fibrosis and negative margins.   Cycle 1 of adjuvant CAPOX 07/19/2012. Cycle 5 of adjuvant CAPOX 10/11/2012.   CEA 2.5 on 11/14/2012.   CEA 14.6 03/08/2013.   Restaging CT evaluation 03/08/2013 with a 4 mm pulmonary nodule left lung base not identified on comparison exam; new liver lesions including a 29 x 26 mm irregular peripheral enhancing rounded lesion in the dome of the right hepatic lobe, a less well-defined new subcapsular lesion in the lateral right hepatic lobe measuring 12 mm, a new subcapsular lesion in the anterior right hepatic lobe adjacent to the gallbladder fossa measuring 10 mm; a rounded low-density lesion in the inferior right hepatic lobe measuring 10 mm compared to 7 mm on the prior study (radiologist commented this may represent an enlarged cyst).   MRI of the abdomen 04/12/2013 confirmed multiple T2 hyperintense metastatic lesions throughout the right liver   Initiation of FOLFIRI/Avastin with genotype based irinotecan dosing per the [facility] study 04/05/2013.   Restaging CT evaluation on 06/20/2013 (after 2 cycles/4 treatments) showed improvement in the liver metastases and stable size of a left lower lobe pulmonary nodule now with central cavitation.   Continuation of FOLFIRI/Avastin.   Restaging CT evaluation 08/14/2013-decrease in the size of liver metastases, slight decrease in the size of a cavitary left lower lobe nodule, no evidence of disease progression.   Status post right hepatic lobectomy 09/17/2013. Pathology showed multiple foci of metastatic adenocarcinoma (5 nodules of metastatic adenocarcinoma 4 of which are subcapsular with the nodules  ranging in size from 0.6-1.8 cm in greatest dimension). Margins not involved. Biopsy of a portal lymph node showed benign  adipose tissue; no lymph node tissue or malignancy.   Normal CEA 11/06/2013   CT 11/06/2013 with a new pleural-based right lower chest lesion, slight in enlargement of the a left-sided lung nodule  CT 01/29/2014 with a decrease in the right lower chest pleural-based lesion and a slight increase of a left-sided lung lesion, other lung lesions were stable  CT chest 05/02/2014 with a decrease in the size of a right lower lobe pulmonary nodule months similar size of a dominant left lower lobe nodule, minimal enlargement of a smaller left lower lobe nodule, no new site of disease  CT chest 11/07/2014 with a slight increase in several left-sided lung nodules  CT chest 02/10/2015 with new small left hilar lymph nodes, a possible new left lower lobe nodule, and stability of other lung nodules  PET scan 03/12/2015 with hypermetabolic left hilar nodes, hypermetabolic left lower lobe nodules, hyper metabolic retroperitoneal nodes, intense hypermetabolism at the coloanal anastomosis, and hypermetabolic thickening in the presacral space  Cycle 1 FOLFIRI/PANITUMUMAB 05/21/2015  Cycle 2 FOLFIRI/PANITUMUMAB 06/05/2015  Cycle 3 FOLFIRI/PANITUMUMAB 06/19/2015  Cycle 4 FOLFIRI 07/10/2015  Cycle 5 FOLFIRI 07/24/2015 2. Irregular bowel habits/rectal bleeding secondary to #1. 3. History of Mild elevation of the liver enzymes. Question secondary to hepatic steatosis. 4. Indeterminate 8 mm posterior right liver lesion on the staging CT 03/06/2012. 5. Mildly elevated CEA at 5.7 on 03/06/2012. 6. History of radiation erythema at the groin and perineum 7. 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 No clinical evidence of thrombophlebitis or extravasation. 8. Delayed nausea following chemotherapy-Decadron prophylaxis was added with cycle 3 CAPOX-improved. 9. Ileostomy takedown 12/07/2012. 10. Oxaliplatin  neuropathy.improved. 11. Port-A-Cath placement 12/31/204 12. Severe neutropenia secondary to chemotherapy following cycle 1 of FOLFIRI, chemotherapy was dose reduced and he received Neulasta with day 15 cycle 1  13. Nausea and vomiting following cycle 1 of FOLFIRI 14. Rectal stricture 15. Leukopenia/Thrombocytopenia-persistent, potentially a sequelae of chemotherapy or hepatic toxicity from chemotherapy/radiation. Bone marrow biopsy 11/20/2014 showed cellular bone marrow with trilineage hematopoiesis. Significant dyspoiesis was not present and there was no evidence of metastatic carcinoma. Cytogenetic analysis showed the presence of normal male chromosomes with no observable clonal chromosomal abnormalities.  probable cirrhosis 16. Genetic testing-negative genetic panel in March 2014 17. 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 Severe over the face-steroid Dosepak prescribed 07/03/2015. Improved 07/10/2015. Further improved 07/24/2015. PANITUMUMAB remains on hold. 18. Diarrhea secondary to chemotherapy-encouraged to use Imodium 07/03/2015 19. Paronychia secondary to PANITUMUMAB   Disposition: Jacob [name] appears stable. He has completed 4 cycles of FOLFIRI/3 cycles of PANITUMUMAB. PANITUMUMAB placed on hold after the third cycle due to a severe skin rash. Plan to proceed with cycle 5 FOLFIRI today as scheduled. PANITUMUMAB will remain on hold.  The skin rash is improving. He will continue minocycline.  The plan is for restaging CT scans after this cycle. He will return for a follow-up visit in 2 weeks. He will contact the office in the interim with any problems.  Patient seen with Dr. Benay Spice.    Ned Card ANP/GNP-BC   07/24/2015  8:50 AM  This was a shared visit with Ned Card. Jacob [name] was interviewed and examined. The panitumumab rash has improved. He does not wish to receive further panitumumab.  Julieanne Manson, M.D.

## 2015-07-26 ENCOUNTER — Ambulatory Visit: Payer: BC Managed Care – PPO

## 2015-07-26 ENCOUNTER — Ambulatory Visit (HOSPITAL_BASED_OUTPATIENT_CLINIC_OR_DEPARTMENT_OTHER): Payer: BC Managed Care – PPO

## 2015-07-26 VITALS — BP 120/76 | HR 66 | Temp 97.7°F | Resp 18

## 2015-07-26 DIAGNOSIS — C20 Malignant neoplasm of rectum: Secondary | ICD-10-CM

## 2015-07-26 DIAGNOSIS — C787 Secondary malignant neoplasm of liver and intrahepatic bile duct: Principal | ICD-10-CM

## 2015-07-26 MED ORDER — SODIUM CHLORIDE 0.9 % IJ SOLN
10.0000 mL | INTRAMUSCULAR | Status: DC | PRN
  Administered 2015-07-26: 10 mL
  Filled 2015-07-26: qty 10

## 2015-07-26 MED ORDER — PEGFILGRASTIM INJECTION 6 MG/0.6ML ~~LOC~~
6.0000 mg | PREFILLED_SYRINGE | Freq: Once | SUBCUTANEOUS | Status: AC
  Administered 2015-07-26: 6 mg via SUBCUTANEOUS

## 2015-07-26 MED ORDER — HEPARIN SOD (PORK) LOCK FLUSH 100 UNIT/ML IV SOLN
500.0000 [IU] | Freq: Once | INTRAVENOUS | Status: AC | PRN
  Administered 2015-07-26: 500 [IU]
  Filled 2015-07-26: qty 5

## 2015-07-26 NOTE — Progress Notes (Signed)
Pump d/c/injection.  VSS.  Pt declined AVS

## 2015-08-03 ENCOUNTER — Other Ambulatory Visit: Payer: Self-pay | Admitting: Oncology

## 2015-08-05 ENCOUNTER — Ambulatory Visit (HOSPITAL_COMMUNITY): Payer: BC Managed Care – PPO

## 2015-08-06 ENCOUNTER — Ambulatory Visit (HOSPITAL_COMMUNITY)
Admission: RE | Admit: 2015-08-06 | Discharge: 2015-08-06 | Disposition: A | Discharge status: Home / Self Care | Payer: BC Managed Care – PPO | Source: Ambulatory Visit | Attending: Nurse Practitioner | Admitting: Nurse Practitioner

## 2015-08-06 DIAGNOSIS — Z9889 Other specified postprocedural states: Secondary | ICD-10-CM | POA: Insufficient documentation

## 2015-08-06 DIAGNOSIS — Z9221 Personal history of antineoplastic chemotherapy: Secondary | ICD-10-CM | POA: Insufficient documentation

## 2015-08-06 DIAGNOSIS — C787 Secondary malignant neoplasm of liver and intrahepatic bile duct: Secondary | ICD-10-CM | POA: Insufficient documentation

## 2015-08-06 DIAGNOSIS — Z8505 Personal history of malignant neoplasm of liver: Secondary | ICD-10-CM | POA: Diagnosis not present

## 2015-08-06 DIAGNOSIS — R161 Splenomegaly, not elsewhere classified: Secondary | ICD-10-CM | POA: Diagnosis not present

## 2015-08-06 DIAGNOSIS — R911 Solitary pulmonary nodule: Secondary | ICD-10-CM | POA: Diagnosis not present

## 2015-08-06 MED ORDER — IOPAMIDOL (ISOVUE-300) INJECTION 61%
100.0000 mL | Freq: Once | INTRAVENOUS | Status: AC | PRN
  Administered 2015-08-06: 100 mL via INTRAVENOUS

## 2015-08-07 ENCOUNTER — Telehealth: Payer: Self-pay | Admitting: Oncology

## 2015-08-07 ENCOUNTER — Other Ambulatory Visit (HOSPITAL_BASED_OUTPATIENT_CLINIC_OR_DEPARTMENT_OTHER): Payer: BC Managed Care – PPO

## 2015-08-07 ENCOUNTER — Ambulatory Visit: Payer: BC Managed Care – PPO

## 2015-08-07 ENCOUNTER — Ambulatory Visit (HOSPITAL_BASED_OUTPATIENT_CLINIC_OR_DEPARTMENT_OTHER): Payer: BC Managed Care – PPO | Admitting: Oncology

## 2015-08-07 ENCOUNTER — Encounter: Payer: BC Managed Care – PPO | Admitting: Nutrition

## 2015-08-07 VITALS — BP 119/63 | HR 73 | Temp 98.2°F | Resp 17 | Ht 70.0 in | Wt 157.4 lb

## 2015-08-07 DIAGNOSIS — C787 Secondary malignant neoplasm of liver and intrahepatic bile duct: Secondary | ICD-10-CM

## 2015-08-07 DIAGNOSIS — L270 Generalized skin eruption due to drugs and medicaments taken internally: Secondary | ICD-10-CM

## 2015-08-07 DIAGNOSIS — R198 Other specified symptoms and signs involving the digestive system and abdomen: Secondary | ICD-10-CM

## 2015-08-07 DIAGNOSIS — C20 Malignant neoplasm of rectum: Secondary | ICD-10-CM | POA: Diagnosis not present

## 2015-08-07 LAB — COMPREHENSIVE METABOLIC PANEL
ALBUMIN: 3.3 g/dL — AB (ref 3.5–5.0)
ALK PHOS: 70 U/L (ref 40–150)
ALT: 23 U/L (ref 0–55)
ANION GAP: 6 meq/L (ref 3–11)
AST: 15 U/L (ref 5–34)
BUN: 8.1 mg/dL (ref 7.0–26.0)
CALCIUM: 8.9 mg/dL (ref 8.4–10.4)
CHLORIDE: 110 meq/L — AB (ref 98–109)
CO2: 28 mEq/L (ref 22–29)
CREATININE: 1 mg/dL (ref 0.7–1.3)
EGFR: >90 mL/min/{1.73_m2} (ref >90)
Glucose: 90 mg/dl (ref 70–140)
POTASSIUM: 3.8 meq/L (ref 3.5–5.1)
Sodium: 144 mEq/L (ref 136–145)
Total Bilirubin: <0.3 mg/dL (ref 0.20–1.20)
Total Protein: 6.1 g/dL — ABNORMAL LOW (ref 6.4–8.3)

## 2015-08-07 LAB — CBC WITH DIFFERENTIAL/PLATELET
BASO%: 0.4 % (ref 0.0–2.0)
BASOS ABS: 0 10*3/uL (ref 0.0–0.1)
EOS ABS: 0 10*3/uL (ref 0.0–0.5)
EOS%: 1.6 % (ref 0.0–7.0)
HEMATOCRIT: 34.4 % — AB (ref 38.4–49.9)
HEMOGLOBIN: 11.2 g/dL — AB (ref 13.0–17.1)
LYMPH#: 0.5 10*3/uL — AB (ref 0.9–3.3)
LYMPH%: 17.3 % (ref 14.0–49.0)
MCH: 29.2 pg (ref 27.2–33.4)
MCHC: 32.6 g/dL (ref 32.0–36.0)
MCV: 89.7 fL (ref 79.3–98.0)
MONO#: 0.3 10*3/uL (ref 0.1–0.9)
MONO%: 11.4 % (ref 0.0–14.0)
NEUT#: 1.9 10*3/uL (ref 1.5–6.5)
NEUT%: 69.3 % (ref 39.0–75.0)
PLATELETS: 157 10*3/uL (ref 140–400)
RBC: 3.83 10*6/uL — ABNORMAL LOW (ref 4.20–5.82)
RDW: 18.4 % — ABNORMAL HIGH (ref 11.0–14.6)
WBC: 2.8 10*3/uL — ABNORMAL LOW (ref 4.0–10.3)

## 2015-08-07 NOTE — Telephone Encounter (Signed)
Gave pt apt & avs °

## 2015-08-07 NOTE — Progress Notes (Addendum)
[place] OFFICE PROGRESS NOTE   Diagnosis: Rectal cancer  INTERVAL HISTORY:   Jacob [name] returns as scheduled. He completed another cycle of FOLFIRI on 07/24/2015. He reports irregular bowel habits and not feeling well following chemotherapy. No nausea/vomiting. The skin rash has improved. The paronychia is resolving. He is eating well.  Objective:  Vital signs in last 24 hours:  Blood pressure 119/63, pulse 73, temperature 98.2 F (36.8 C), temperature source Oral, resp. rate 17, height _0  (1.778 m), weight 157 lb 6.4 oz (71.396 kg), SpO2 100 %.    HEENT: No thrush or ulcers Resp: Lungs clear bilaterally Cardio: Regular rate and rhythm GI: No hepatomegaly, no mass, nontender Vascular: No leg edema  Skin: Acne type rash over the trunk-almost completely resolved, mild scabbing at the lips, resolving paronychia over the hands   Portacath/PICC-without erythema  Lab Results:  Lab Results  Component Value Date   WBC 2.8* 08/07/2015   HGB 11.2* 08/07/2015   HCT 34.4* 08/07/2015   MCV 89.7 08/07/2015   PLT 157 08/07/2015   NEUTROABS 1.9 08/07/2015      Lab Results  Component Value Date   CEA1 2.4 07/03/2015    Imaging:  Ct Chest W Contrast  08/06/2015  CLINICAL DATA:  Restaging of rectal cancer with pulmonary, hepatic and nodal metastases. Low anterior colon resection in 2013. Chemotherapy completed in 2014. EXAM: CT CHEST, ABDOMEN, AND PELVIS WITH CONTRAST TECHNIQUE: Multidetector CT imaging of the chest, abdomen and pelvis was performed following the standard protocol during bolus administration of intravenous contrast. CONTRAST:  163m ISOVUE-300 IOPAMIDOL (ISOVUE-300) INJECTION 61% COMPARISON:  PET-CT 03/12/2015. Chest CT 02/10/2015 and abdominal CT 09/20/2013. FINDINGS: CT CHEST Mediastinum/Nodes: There are no residual enlarged left hilar lymph nodes. There is no mediastinal or axillary lymphadenopathy. The thyroid gland, trachea and esophagus  demonstrate no significant findings. The heart size is normal. There is no pericardial effusion. There are no significant vascular findings. Left subclavian Port-A-Cath extends to the SVC right atrial junction. Lungs/Pleura: There is no pleural effusion. Previously demonstrated left lung nodularity has improved. The cavitary lower lobe lesion has a residual nodular component on image 83. The small left lower lobe nodule which previously measured 10 mm measures 5 mm on image number 90. 5 mm left apical lesion on image 14 is unchanged. Perifissural nodularity along the inferior aspect of the left major fissure on image 111 is improved. 6 x 9 mm right lower lobe nodule on image 115 is unchanged. There are no new or enlarging pulmonary nodules. Musculoskeletal/Chest wall: No chest wall mass or suspicious osseous findings. CT ABDOMEN AND PELVIS FINDINGS Hepatobiliary: The liver has a stable appearance status post partial right hepatectomy. No suspicious hepatic findings. No significant biliary dilatation status post cholecystectomy. Pancreas: Unremarkable. No pancreatic ductal dilatation or surrounding inflammatory changes. Spleen: Mild splenomegaly.  No focal splenic lesions. Adrenals/Urinary Tract: Both adrenal glands appear normal. The kidneys appear normal without evidence of urinary tract calculus, suspicious lesion or hydronephrosis. No bladder abnormalities are seen. Stomach/Bowel: No evidence of bowel wall thickening, distention or surrounding inflammatory change. Stable postsurgical changes status post distal colon resection and anastomosis. Presacral fibrosis appears unchanged. Vascular/Lymphatic: There are no enlarged abdominal or pelvic lymph nodes. No residual enlarged retroperitoneal lymph nodes are seen. There are no significant vascular findings Reproductive: The prostate gland and seminal vesicles appear unchanged. Other: Stable postsurgical changes in the anterior abdominal wall and presacral fibrosis.  There is a small amount pelvic ascites without peritoneal nodularity.  Musculoskeletal: No acute or significant osseous findings. 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 IMPRESSION: 1. Previously demonstrated metastatic disease to left hilar and retroperitoneal lymph nodes has resolved. No enlarged lymph nodes are seen within the chest, abdomen or pelvis. 2. The hypermetabolic pulmonary nodule seen on the prior examination have improved. Other small nodules are unchanged. No new or enlarging nodules identified. 3. No evidence of recurrent hepatic metastatic disease status post partial hepatectomy. 4. Stable mild splenomegaly. Electronically Signed   By: Richardean Sale M.D.   On: 08/06/2015 16:48   Ct Abdomen Pelvis W Contrast  08/06/2015  CLINICAL DATA:  Restaging of rectal cancer with pulmonary, hepatic and nodal metastases. Low anterior colon resection in 2013. Chemotherapy completed in 2014. EXAM: CT CHEST, ABDOMEN, AND PELVIS WITH CONTRAST TECHNIQUE: Multidetector CT imaging of the chest, abdomen and pelvis was performed following the standard protocol during bolus administration of intravenous contrast. CONTRAST:  125m ISOVUE-300 IOPAMIDOL (ISOVUE-300) INJECTION 61% COMPARISON:  PET-CT 03/12/2015. Chest CT 02/10/2015 and abdominal CT 09/20/2013. FINDINGS: CT CHEST Mediastinum/Nodes: There are no residual enlarged left hilar lymph nodes. There is no mediastinal or axillary lymphadenopathy. The thyroid gland, trachea and esophagus demonstrate no significant findings. The heart size is normal. There is no pericardial effusion. There are no significant vascular findings. Left subclavian Port-A-Cath extends to the SVC right atrial junction. Lungs/Pleura: There is no pleural effusion. Previously demonstrated left lung nodularity has improved. The cavitary lower lobe lesion has a residual nodular component on image 83. The small left lower lobe nodule which  previously measured 10 mm measures 5 mm on image number 90. 5 mm left apical lesion on image 14 is unchanged. Perifissural nodularity along the inferior aspect of the left major fissure on image 111 is improved. 6 x 9 mm right lower lobe nodule on image 115 is unchanged. There are no new or enlarging pulmonary nodules. Musculoskeletal/Chest wall: No chest wall mass or suspicious osseous findings. CT ABDOMEN AND PELVIS FINDINGS Hepatobiliary: The liver has a stable appearance status post partial right hepatectomy. No suspicious hepatic findings. No significant biliary dilatation status post cholecystectomy. Pancreas: Unremarkable. No pancreatic ductal dilatation or surrounding inflammatory changes. Spleen: Mild splenomegaly.  No focal splenic lesions. Adrenals/Urinary Tract: Both adrenal glands appear normal. The kidneys appear normal without evidence of urinary tract calculus, suspicious lesion or hydronephrosis. No bladder abnormalities are seen. Stomach/Bowel: No evidence of bowel wall thickening, distention or surrounding inflammatory change. Stable postsurgical changes status post distal colon resection and anastomosis. Presacral fibrosis appears unchanged. Vascular/Lymphatic: There are no enlarged abdominal or pelvic lymph nodes. No residual enlarged retroperitoneal lymph nodes are seen. There are no significant vascular findings Reproductive: The prostate gland and seminal vesicles appear unchanged. Other: Stable postsurgical changes in the anterior abdominal wall and presacral fibrosis. There is a small amount pelvic ascites without peritoneal nodularity. Musculoskeletal: No acute or significant osseous findings. 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 IMPRESSION: 1. Previously demonstrated metastatic disease to left hilar and retroperitoneal lymph nodes has resolved. No enlarged lymph nodes are seen within the chest, abdomen or pelvis. 2. The  hypermetabolic pulmonary nodule seen on the prior examination have improved. Other small nodules are unchanged. No new or enlarging nodules identified. 3. No evidence of recurrent hepatic metastatic disease status post partial hepatectomy. 4. Stable mild splenomegaly. Electronically Signed   By: WRichardean SaleM.D.   On: 08/06/2015 16:48   CT images were reviewed with Mr.  [name] Medications: I have reviewed the patient's current medications.  Assessment/Plan: 1. Rectal cancer. Partially obstructing mass noted 1-2 cm from the anal verge on a colonoscopy 03/06/2012. Endoscopic ultrasound 03/14/2012 with a 7.5 mm thick, 3.2 cm wide hypoechoic, irregularly bordered mass that clearly passed into and through the muscularis propria layer of the distal rectal wall (uT3); 3 small (largest 7 mm) perirectal lymph nodes. The lymph nodes were all round, discrete, hypoechoic, homogenous; suspicious for malignant involvement (uN1).  No RAS mutation identified by [facility] 1 testing on the colon resection specimen 06/29/2012, APC alteration identified, microsatellite stable  He began radiation and concurrent Xeloda chemotherapy on 03/20/2012, completed 04/27/2012.   Low anterior resection/coloanal anastomosis and diverting ileostomy 06/29/2012 with the final pathology revealing a T2N0 tumor with extensive fibrosis and negative margins.   Cycle 1 of adjuvant CAPOX 07/19/2012. Cycle 5 of adjuvant CAPOX 10/11/2012.   CEA 2.5 on 11/14/2012.   CEA 14.6 03/08/2013.   Restaging CT evaluation 03/08/2013 with a 4 mm pulmonary nodule left lung base not identified on comparison exam; new liver lesions including a 29 x 26 mm irregular peripheral enhancing rounded lesion in the dome of the right hepatic lobe, a less well-defined new subcapsular lesion in the lateral right hepatic lobe measuring 12 mm, a new subcapsular lesion in the anterior right hepatic lobe adjacent to the gallbladder fossa measuring 10 mm; a  rounded low-density lesion in the inferior right hepatic lobe measuring 10 mm compared to 7 mm on the prior study (radiologist commented this may represent an enlarged cyst).   MRI of the abdomen 04/12/2013 confirmed multiple T2 hyperintense metastatic lesions throughout the right liver   Initiation of FOLFIRI/Avastin with genotype based irinotecan dosing per the [facility] study 04/05/2013.   Restaging CT evaluation on 06/20/2013 (after 2 cycles/4 treatments) showed improvement in the liver metastases and stable size of a left lower lobe pulmonary nodule now with central cavitation.   Continuation of FOLFIRI/Avastin.   Restaging CT evaluation 08/14/2013-decrease in the size of liver metastases, slight decrease in the size of a cavitary left lower lobe nodule, no evidence of disease progression.   Status post right hepatic lobectomy 09/17/2013. Pathology showed multiple foci of metastatic adenocarcinoma (5 nodules of metastatic adenocarcinoma 4 of which are subcapsular with the nodules ranging in size from 0.6-1.8 cm in greatest dimension). Margins not involved. Biopsy of a portal lymph node showed benign adipose tissue; no lymph node tissue or malignancy.   Normal CEA 11/06/2013   CT 11/06/2013 with a new pleural-based right lower chest lesion, slight in enlargement of the a left-sided lung nodule  CT 01/29/2014 with a decrease in the right lower chest pleural-based lesion and a slight increase of a left-sided lung lesion, other lung lesions were stable  CT chest 05/02/2014 with a decrease in the size of a right lower lobe pulmonary nodule months similar size of a dominant left lower lobe nodule, minimal enlargement of a smaller left lower lobe nodule, no new site of disease  CT chest 11/07/2014 with a slight increase in several left-sided lung nodules  CT chest 02/10/2015 with new small left hilar lymph nodes, a possible new left lower lobe nodule, and stability of other lung  nodules  PET scan 03/12/2015 with hypermetabolic left hilar nodes, hypermetabolic left lower lobe nodules, hyper metabolic retroperitoneal nodes, intense hypermetabolism at the coloanal anastomosis, and hypermetabolic thickening in the presacral space  Cycle 1 FOLFIRI/PANITUMUMAB 05/21/2015  Cycle 2 FOLFIRI/PANITUMUMAB 06/05/2015  Cycle 3 FOLFIRI/PANITUMUMAB 06/19/2015  Cycle  4 FOLFIRI 07/10/2015  Cycle 5 FOLFIRI 07/24/2015  Restaging CTs 08/06/2015-resolution of hilar/retroperitoneal adenopathy, improvement in the hypermetabolic lung nodule, other lung nodules are stable, no new lesions 2. Irregular bowel habits/rectal bleeding secondary to #1. 3. History of Mild elevation of the liver enzymes. Question secondary to hepatic steatosis. 4. Indeterminate 8 mm posterior right liver lesion on the staging CT 03/06/2012. 5. Mildly elevated CEA at 5.7 on 03/06/2012. 6. History of radiation erythema at the groin and perineum 7. 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 No clinical evidence of thrombophlebitis or extravasation. 8. Delayed nausea following chemotherapy-Decadron prophylaxis was added with cycle 3 CAPOX-improved. 9. Ileostomy takedown 12/07/2012. 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 11. Port-A-Cath placement 12/31/204 12. Severe neutropenia secondary to chemotherapy following cycle 1 of FOLFIRI, chemotherapy was dose reduced and he received Neulasta with day 15 cycle 1  13. Nausea and vomiting following cycle 1 of FOLFIRI 14. Rectal stricture 15. Leukopenia/Thrombocytopenia-persistent, potentially a sequelae of chemotherapy or hepatic toxicity from chemotherapy/radiation. Bone marrow biopsy 11/20/2014 showed cellular bone marrow with trilineage hematopoiesis. Significant dyspoiesis was not present and there was no evidence of metastatic carcinoma. Cytogenetic analysis showed the presence of  normal male chromosomes with no observable clonal chromosomal abnormalities.  probable cirrhosis 16. Genetic testing-negative genetic panel in March 2014 17. 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 Severe over the face-steroid Dosepak prescribed 07/03/2015. Improved 07/10/2015. Further improved 07/24/2015 and 08/07/2015 18. Diarrhea secondary to chemotherapy-encouraged to use Imodium 07/03/2015 19. Paronychia secondary to [facility]  Disposition:  Jacob [name] has completed 5 cycles of FOLFIRI. Panitumumab was placed on hold following cycle 3 secondary to severe skin toxicity.  The CEA has normalized and the restaging CTs confirmed a clinical response.  I discussed treatment options at length with Jacob [name] and his wife. We discussed continuing FOLFIRI, FOLFIRI/panitumumab, observation, and substituting cetuximab for the panitumumab. We discussed the potential for an allergic reaction with cetuximab. We discussed the likelihood of a progressive skin rash with repeat panitumumab treatment.  He would like to delay treatment for one week. He would then like to proceed with FOLFIRI/panitumumab. We will dose reduce the panitumumab and plan to give panitumumab only every 3-4 cycles with the hope of reducing the skin toxicity. He understands the potential for skin toxicity with further treatment. The irinotecan has been previously dose reduced. We will continue the current irinotecan dose.   Betsy Coder, MD  08/07/2015  10:46 AM

## 2015-08-08 LAB — CEA: CEA: 1.8 ng/mL (ref 0.0–4.7)

## 2015-08-09 ENCOUNTER — Ambulatory Visit: Payer: BC Managed Care – PPO

## 2015-08-10 ENCOUNTER — Other Ambulatory Visit: Payer: Self-pay | Admitting: Oncology

## 2015-08-13 ENCOUNTER — Telehealth: Payer: Self-pay | Admitting: Oncology

## 2015-08-13 NOTE — Telephone Encounter (Signed)
returned call and lvm that i can not move chemo to 6.1 due to chemo room not taking any more pts

## 2015-08-14 ENCOUNTER — Encounter: Payer: Self-pay | Admitting: *Deleted

## 2015-08-14 ENCOUNTER — Other Ambulatory Visit: Payer: Self-pay | Admitting: Oncology

## 2015-08-14 ENCOUNTER — Ambulatory Visit: Payer: BC Managed Care – PPO

## 2015-08-14 ENCOUNTER — Other Ambulatory Visit: Payer: Self-pay | Admitting: *Deleted

## 2015-08-14 DIAGNOSIS — C20 Malignant neoplasm of rectum: Secondary | ICD-10-CM

## 2015-08-14 DIAGNOSIS — C787 Secondary malignant neoplasm of liver and intrahepatic bile duct: Secondary | ICD-10-CM

## 2015-08-14 NOTE — Progress Notes (Signed)
Call placed to check on patient since he did not show up for his appointments today.  Pt stated that he had called yesterday to cancel appointments with scheduling.  Pt does not want to reschedule for next week, but will be here for his appointments on 08/28/15 as scheduled.  Pt has no questions or concerns at this time.  Dr. Benay Spice notified.

## 2015-08-15 ENCOUNTER — Other Ambulatory Visit: Payer: Self-pay | Admitting: Oncology

## 2015-08-16 ENCOUNTER — Ambulatory Visit: Payer: BC Managed Care – PPO

## 2015-08-17 ENCOUNTER — Other Ambulatory Visit: Payer: Self-pay | Admitting: Oncology

## 2015-08-21 ENCOUNTER — Ambulatory Visit: Payer: BC Managed Care – PPO

## 2015-08-28 ENCOUNTER — Telehealth: Payer: Self-pay | Admitting: Nurse Practitioner

## 2015-08-28 ENCOUNTER — Telehealth: Payer: Self-pay | Admitting: *Deleted

## 2015-08-28 ENCOUNTER — Ambulatory Visit (HOSPITAL_BASED_OUTPATIENT_CLINIC_OR_DEPARTMENT_OTHER): Payer: BC Managed Care – PPO | Admitting: Nurse Practitioner

## 2015-08-28 ENCOUNTER — Ambulatory Visit: Payer: BC Managed Care – PPO

## 2015-08-28 ENCOUNTER — Ambulatory Visit (HOSPITAL_BASED_OUTPATIENT_CLINIC_OR_DEPARTMENT_OTHER): Payer: BC Managed Care – PPO

## 2015-08-28 ENCOUNTER — Other Ambulatory Visit (HOSPITAL_BASED_OUTPATIENT_CLINIC_OR_DEPARTMENT_OTHER): Payer: BC Managed Care – PPO

## 2015-08-28 ENCOUNTER — Ambulatory Visit: Payer: BC Managed Care – PPO | Admitting: Nutrition

## 2015-08-28 VITALS — BP 115/68 | HR 66 | Temp 97.9°F | Resp 18 | Wt 162.1 lb

## 2015-08-28 DIAGNOSIS — D696 Thrombocytopenia, unspecified: Secondary | ICD-10-CM | POA: Diagnosis not present

## 2015-08-28 DIAGNOSIS — Z5112 Encounter for antineoplastic immunotherapy: Secondary | ICD-10-CM

## 2015-08-28 DIAGNOSIS — C20 Malignant neoplasm of rectum: Secondary | ICD-10-CM | POA: Diagnosis not present

## 2015-08-28 DIAGNOSIS — Z95828 Presence of other vascular implants and grafts: Secondary | ICD-10-CM

## 2015-08-28 DIAGNOSIS — C787 Secondary malignant neoplasm of liver and intrahepatic bile duct: Secondary | ICD-10-CM

## 2015-08-28 DIAGNOSIS — Z5111 Encounter for antineoplastic chemotherapy: Secondary | ICD-10-CM | POA: Diagnosis not present

## 2015-08-28 LAB — COMPREHENSIVE METABOLIC PANEL
ALK PHOS: 56 U/L (ref 40–150)
ALT: 14 U/L (ref 0–55)
AST: 16 U/L (ref 5–34)
Albumin: 3.7 g/dL (ref 3.5–5.0)
Anion Gap: 6 mEq/L (ref 3–11)
BUN: 11.1 mg/dL (ref 7.0–26.0)
CHLORIDE: 107 meq/L (ref 98–109)
CO2: 27 meq/L (ref 22–29)
Calcium: 9.1 mg/dL (ref 8.4–10.4)
Creatinine: 0.9 mg/dL (ref 0.7–1.3)
GLUCOSE: 92 mg/dL (ref 70–140)
POTASSIUM: 3.8 meq/L (ref 3.5–5.1)
SODIUM: 139 meq/L (ref 136–145)
Total Bilirubin: 0.35 mg/dL (ref 0.20–1.20)
Total Protein: 6.3 g/dL — ABNORMAL LOW (ref 6.4–8.3)

## 2015-08-28 LAB — CBC WITH DIFFERENTIAL/PLATELET
BASO%: 0.3 % (ref 0.0–2.0)
BASOS ABS: 0 10*3/uL (ref 0.0–0.1)
EOS%: 1.8 % (ref 0.0–7.0)
Eosinophils Absolute: 0.1 10*3/uL (ref 0.0–0.5)
HCT: 35.8 % — ABNORMAL LOW (ref 38.4–49.9)
HGB: 11.6 g/dL — ABNORMAL LOW (ref 13.0–17.1)
LYMPH%: 15.9 % (ref 14.0–49.0)
MCH: 29.8 pg (ref 27.2–33.4)
MCHC: 32.4 g/dL (ref 32.0–36.0)
MCV: 92 fL (ref 79.3–98.0)
MONO#: 0.4 10*3/uL (ref 0.1–0.9)
MONO%: 9.7 % (ref 0.0–14.0)
NEUT#: 2.8 10*3/uL (ref 1.5–6.5)
NEUT%: 72.3 % (ref 39.0–75.0)
Platelets: 84 10*3/uL — ABNORMAL LOW (ref 140–400)
RBC: 3.89 10*6/uL — AB (ref 4.20–5.82)
RDW: 17.1 % — AB (ref 11.0–14.6)
WBC: 3.9 10*3/uL — AB (ref 4.0–10.3)
lymph#: 0.6 10*3/uL — ABNORMAL LOW (ref 0.9–3.3)
nRBC: 0 % (ref 0–0)

## 2015-08-28 LAB — MAGNESIUM: Magnesium: 2.1 mg/dl (ref 1.5–2.5)

## 2015-08-28 MED ORDER — FLUOROURACIL CHEMO INJECTION 2.5 GM/50ML
400.0000 mg/m2 | Freq: Once | INTRAVENOUS | Status: AC
  Administered 2015-08-28: 750 mg via INTRAVENOUS
  Filled 2015-08-28: qty 15

## 2015-08-28 MED ORDER — SODIUM CHLORIDE 0.9 % IJ SOLN
10.0000 mL | INTRAMUSCULAR | Status: DC | PRN
  Administered 2015-08-28: 10 mL via INTRAVENOUS
  Filled 2015-08-28: qty 10

## 2015-08-28 MED ORDER — SODIUM CHLORIDE 0.9 % IV SOLN
Freq: Once | INTRAVENOUS | Status: AC
  Administered 2015-08-28: 14:00:00 via INTRAVENOUS
  Filled 2015-08-28: qty 5

## 2015-08-28 MED ORDER — SODIUM CHLORIDE 0.9 % IV SOLN
2405.0000 mg/m2 | INTRAVENOUS | Status: DC
  Administered 2015-08-28: 4600 mg via INTRAVENOUS
  Filled 2015-08-28: qty 92

## 2015-08-28 MED ORDER — IRINOTECAN HCL CHEMO INJECTION 100 MG/5ML
136.0000 mg/m2 | Freq: Once | INTRAVENOUS | Status: AC
  Administered 2015-08-28: 260 mg via INTRAVENOUS
  Filled 2015-08-28: qty 10

## 2015-08-28 MED ORDER — SODIUM CHLORIDE 0.9 % IV SOLN
3.0000 mg/kg | Freq: Once | INTRAVENOUS | Status: AC
  Administered 2015-08-28: 220 mg via INTRAVENOUS
  Filled 2015-08-28: qty 11

## 2015-08-28 MED ORDER — ATROPINE SULFATE 1 MG/ML IJ SOLN: 0.5000 mg | Freq: Once | INTRAMUSCULAR | Status: DC | PRN

## 2015-08-28 MED ORDER — PALONOSETRON HCL INJECTION 0.25 MG/5ML
INTRAVENOUS | Status: AC
  Filled 2015-08-28: qty 5

## 2015-08-28 MED ORDER — DEXTROSE 5 % IV SOLN
398.0000 mg/m2 | Freq: Once | INTRAVENOUS | Status: AC
  Administered 2015-08-28: 760 mg via INTRAVENOUS
  Filled 2015-08-28: qty 38

## 2015-08-28 MED ORDER — PALONOSETRON HCL INJECTION 0.25 MG/5ML
0.2500 mg | Freq: Once | INTRAVENOUS | Status: AC
  Administered 2015-08-28: 0.25 mg via INTRAVENOUS

## 2015-08-28 MED ORDER — SODIUM CHLORIDE 0.9 % IV SOLN
Freq: Once | INTRAVENOUS | Status: AC
  Administered 2015-08-28: 13:00:00 via INTRAVENOUS

## 2015-08-28 NOTE — Telephone Encounter (Signed)
Per staff message and POF I have scheduled appts. Advised scheduler of appts. JMW  

## 2015-08-28 NOTE — Telephone Encounter (Signed)
Pt will get new sched in tx room

## 2015-08-28 NOTE — Patient Instructions (Signed)
[facility] Discharge Instructions for Patients Receiving Chemotherapy  Today you received the following chemotherapy agents FOLFIRI/Vectibix To help prevent nausea and vomiting after your treatment, we encourage you to take your nausea medication as needed   If you develop nausea and vomiting that is not controlled by your nausea medication, call the clinic.   BELOW ARE SYMPTOMS THAT SHOULD BE REPORTED IMMEDIATELY:  *FEVER GREATER THAN 100.5 F  *CHILLS WITH OR WITHOUT FEVER  NAUSEA AND VOMITING THAT IS NOT CONTROLLED WITH YOUR NAUSEA MEDICATION  *UNUSUAL SHORTNESS OF BREATH  *UNUSUAL BRUISING OR BLEEDING  TENDERNESS IN MOUTH AND THROAT WITH OR WITHOUT PRESENCE OF ULCERS  *URINARY PROBLEMS  *BOWEL PROBLEMS  UNUSUAL RASH Items with * indicate a potential emergency and should be followed up as soon as possible.  Feel free to call the clinic you have any questions or concerns. The clinic phone number is [phone].  Please show the [facility] at check-in to the Emergency Department and triage nurse.

## 2015-08-28 NOTE — Progress Notes (Signed)
[facility] OFFICE PROGRESS NOTE   Diagnosis:  Rectal cancer  INTERVAL HISTORY:   Mr. Ballin returns as scheduled. He completed cycle 5 FOLFIRI 07/24/2015. Restaging CT evaluation 08/06/2015 showed improvement.  Skin continues to be improved. No nausea or vomiting. No mouth sores. No diarrhea. Overall good appetite.  Objective:  Vital signs in last 24 hours:  Blood pressure 115/68, pulse 66, temperature 97.9 F (36.6 C), temperature source Oral, resp. rate 18, weight 162 lb 1.6 oz (73.528 kg), SpO2 100 %.    HEENT: No thrush or ulcers. Resp: Lungs clear bilaterally. Cardio: Regular rate and rhythm. GI: Abdomen soft and nontender. No hepatomegaly. Vascular: No leg edema. Skin: Resolving acne type rash over the trunk. Port-A-Cath without erythema.   Lab Results:  Lab Results  Component Value Date   WBC 3.9* 08/28/2015   HGB 11.6* 08/28/2015   HCT 35.8* 08/28/2015   MCV 92.0 08/28/2015   PLT 84* 08/28/2015   NEUTROABS 2.8 08/28/2015    Imaging:  No results found.  Medications: I have reviewed the patient's current medications.  Assessment/Plan: 1. Rectal cancer. Partially obstructing mass noted 1-2 cm from the anal verge on a colonoscopy 03/06/2012. Endoscopic ultrasound 03/14/2012 with a 7.5 mm thick, 3.2 cm wide hypoechoic, irregularly bordered mass that clearly passed into and through the muscularis propria layer of the distal rectal wall (uT3); 3 small (largest 7 mm) perirectal lymph nodes. The lymph nodes were all round, discrete, hypoechoic, homogenous; suspicious for malignant involvement (uN1).  No RAS mutation identified by [facility] 1 testing on the colon resection specimen 06/29/2012, APC alteration identified, microsatellite stable  He began radiation and concurrent Xeloda chemotherapy on 03/20/2012, completed 04/27/2012.   Low anterior resection/coloanal anastomosis and diverting ileostomy 06/29/2012 with the final pathology revealing a  T2N0 tumor with extensive fibrosis and negative margins.   Cycle 1 of adjuvant CAPOX 07/19/2012. Cycle 5 of adjuvant CAPOX 10/11/2012.   CEA 2.5 on 11/14/2012.   CEA 14.6 03/08/2013.   Restaging CT evaluation 03/08/2013 with a 4 mm pulmonary nodule left lung base not identified on comparison exam; new liver lesions including a 29 x 26 mm irregular peripheral enhancing rounded lesion in the dome of the right hepatic lobe, a less well-defined new subcapsular lesion in the lateral right hepatic lobe measuring 12 mm, a new subcapsular lesion in the anterior right hepatic lobe adjacent to the gallbladder fossa measuring 10 mm; a rounded low-density lesion in the inferior right hepatic lobe measuring 10 mm compared to 7 mm on the prior study (radiologist commented this may represent an enlarged cyst).   MRI of the abdomen 04/12/2013 confirmed multiple T2 hyperintense metastatic lesions throughout the right liver   Initiation of FOLFIRI/Avastin with genotype based irinotecan dosing per the [facility] study 04/05/2013.   Restaging CT evaluation on 06/20/2013 (after 2 cycles/4 treatments) showed improvement in the liver metastases and stable size of a left lower lobe pulmonary nodule now with central cavitation.   Continuation of FOLFIRI/Avastin.   Restaging CT evaluation 08/14/2013-decrease in the size of liver metastases, slight decrease in the size of a cavitary left lower lobe nodule, no evidence of disease progression.   Status post right hepatic lobectomy 09/17/2013. Pathology showed multiple foci of metastatic adenocarcinoma (5 nodules of metastatic adenocarcinoma 4 of which are subcapsular with the nodules ranging in size from 0.6-1.8 cm in greatest dimension). Margins not involved. Biopsy of a portal lymph node showed benign adipose tissue; no lymph node tissue or malignancy.   Normal  CEA 11/06/2013   CT 11/06/2013 with a new pleural-based right lower chest lesion, slight in enlargement  of the a left-sided lung nodule  CT 01/29/2014 with a decrease in the right lower chest pleural-based lesion and a slight increase of a left-sided lung lesion, other lung lesions were stable  CT chest 05/02/2014 with a decrease in the size of a right lower lobe pulmonary nodule months similar size of a dominant left lower lobe nodule, minimal enlargement of a smaller left lower lobe nodule, no new site of disease  CT chest 11/07/2014 with a slight increase in several left-sided lung nodules  CT chest 02/10/2015 with new small left hilar lymph nodes, a possible new left lower lobe nodule, and stability of other lung nodules  PET scan 03/12/2015 with hypermetabolic left hilar nodes, hypermetabolic left lower lobe nodules, hyper metabolic retroperitoneal nodes, intense hypermetabolism at the coloanal anastomosis, and hypermetabolic thickening in the presacral space  Cycle 1 FOLFIRI/PANITUMUMAB 05/21/2015  Cycle 2 FOLFIRI/PANITUMUMAB 06/05/2015  Cycle 3 FOLFIRI/PANITUMUMAB 06/19/2015  Cycle 4 FOLFIRI 07/10/2015  Cycle 5 FOLFIRI 07/24/2015  Restaging CTs 08/06/2015-resolution of hilar/retroperitoneal adenopathy, improvement in the hypermetabolic lung nodule, other lung nodules are stable, no new lesions  Cycle 6 FOLFIRI with PANITUMUMAB 08/28/2015 (PANITUMUMAB to be given only every 3 or 4 cycles to hopefully reduce skin toxicity) 2. Irregular bowel habits/rectal bleeding secondary to #1. 3. History of Mild elevation of the liver enzymes. Question secondary to hepatic steatosis. 4. Indeterminate 8 mm posterior right liver lesion on the staging CT 03/06/2012. 5. Mildly elevated CEA at 5.7 on 03/06/2012. 6. History of radiation erythema at the groin and perineum 7. 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 No clinical evidence of thrombophlebitis or extravasation. 8. Delayed nausea following  chemotherapy-Decadron prophylaxis was added with cycle 3 CAPOX-improved. 9. Ileostomy takedown 12/07/2012. 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 11. Port-A-Cath placement 12/31/204 12. Severe neutropenia secondary to chemotherapy following cycle 1 of FOLFIRI, chemotherapy was dose reduced and he received Neulasta with day 15 cycle 1  13. Nausea and vomiting following cycle 1 of FOLFIRI 14. Rectal stricture 15. Leukopenia/Thrombocytopenia-persistent, potentially a sequelae of chemotherapy or hepatic toxicity from chemotherapy/radiation. Bone marrow biopsy 11/20/2014 showed cellular bone marrow with trilineage hematopoiesis. Significant dyspoiesis was not present and there was no evidence of metastatic carcinoma. Cytogenetic analysis showed the presence of normal male chromosomes with no observable clonal chromosomal abnormalities.  probable cirrhosis 16. Genetic testing-negative genetic panel in March 2014 17. 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 Severe over the face-steroid Dosepak prescribed 07/03/2015. Improved 07/10/2015. Further improved 07/24/2015, 08/07/2015, 08/28/2015 18. Diarrhea secondary to chemotherapy-encouraged to use Imodium 07/03/2015 19. Paronychia secondary to [facility]   Disposition: Jacob [name] appears stable. Skin continues to be improved. The plan is to proceed with cycle 6 FOLFIRI plus PANITUMUMAB today as scheduled. The PANITUMUMAB will be given only every 3 or 4 cycles to hopefully minimize the skin toxicity.  He has mild thrombocytopenia on labs today. He understands to contact the office with any bleeding.  He will return for a follow-up visit and cycle 7 FOLFIRI in 2 weeks. He will contact the office in the interim with any problems.  Plan reviewed with Dr. Benay Spice.    Ned Card ANP/GNP-BC   08/28/2015  12:49 PM

## 2015-08-28 NOTE — Patient Instructions (Signed)

## 2015-08-28 NOTE — Progress Notes (Signed)
Attempted nutrition follow-up with patient during infusion. Patient last seen on 07/10/2015 secondary to metastatic rectal cancer. Patient refuses nutrition counseling.  He states he does know why nutrition appointments are scheduled. I reassured patient I would cancel any nutrition appointments currently scheduled for the future. Encouraged him to contact me if he has questions or concerns.

## 2015-08-30 ENCOUNTER — Ambulatory Visit: Payer: BC Managed Care – PPO

## 2015-08-30 ENCOUNTER — Ambulatory Visit (HOSPITAL_BASED_OUTPATIENT_CLINIC_OR_DEPARTMENT_OTHER): Payer: BC Managed Care – PPO

## 2015-08-30 VITALS — BP 118/68 | HR 72 | Temp 98.1°F | Resp 18

## 2015-08-30 DIAGNOSIS — C20 Malignant neoplasm of rectum: Secondary | ICD-10-CM | POA: Diagnosis not present

## 2015-08-30 DIAGNOSIS — C787 Secondary malignant neoplasm of liver and intrahepatic bile duct: Secondary | ICD-10-CM

## 2015-08-30 MED ORDER — HEPARIN SOD (PORK) LOCK FLUSH 100 UNIT/ML IV SOLN
500.0000 [IU] | Freq: Once | INTRAVENOUS | Status: AC | PRN
Start: 2015-08-30 — End: 2015-08-30
  Administered 2015-08-30: 500 [IU]
  Filled 2015-08-30: qty 5

## 2015-08-30 MED ORDER — PEGFILGRASTIM INJECTION 6 MG/0.6ML ~~LOC~~
6.0000 mg | PREFILLED_SYRINGE | Freq: Once | SUBCUTANEOUS | Status: AC
  Administered 2015-08-30: 6 mg via SUBCUTANEOUS

## 2015-08-30 MED ORDER — SODIUM CHLORIDE 0.9 % IJ SOLN
10.0000 mL | INTRAMUSCULAR | Status: DC | PRN
  Administered 2015-08-30: 10 mL
  Filled 2015-08-30: qty 10

## 2015-08-30 NOTE — Patient Instructions (Signed)
Pegfilgrastim injection What is this medicine? PEGFILGRASTIM (PEG fil gra stim) is a long-acting granulocyte colony-stimulating factor that stimulates the growth of neutrophils, a type of white blood cell important in the body's fight against infection. It is used to reduce the incidence of fever and infection in patients with certain types of cancer who are receiving chemotherapy that affects the bone marrow, and to increase survival after being exposed to high doses of radiation. This medicine may be used for other purposes; ask your health care provider or pharmacist if you have questions. What should I tell my health care provider before I take this medicine? They need to know if you have any of these conditions: -kidney disease -latex allergy -ongoing radiation therapy -sickle cell disease -skin reactions to acrylic adhesives (On-Body Injector only) -an unusual or allergic reaction to pegfilgrastim, filgrastim, other medicines, foods, dyes, or preservatives -pregnant or trying to get pregnant -breast-feeding How should I use this medicine? This medicine is for injection under the skin. If you get this medicine at home, you will be taught how to prepare and give the pre-filled syringe or how to use the On-body Injector. Refer to the patient Instructions for Use for detailed instructions. Use exactly as directed. Take your medicine at regular intervals. Do not take your medicine more often than directed. It is important that you put your used needles and syringes in a special sharps container. Do not put them in a trash can. If you do not have a sharps container, call your pharmacist or healthcare provider to get one. Talk to your pediatrician regarding the use of this medicine in children. While this drug may be prescribed for selected conditions, precautions do apply. Overdosage: If you think you have taken too much of this medicine contact a poison control center or emergency room at  once. NOTE: This medicine is only for you. Do not share this medicine with others. What if I miss a dose? It is important not to miss your dose. Call your doctor or health care professional if you miss your dose. If you miss a dose due to an On-body Injector failure or leakage, a new dose should be administered as soon as possible using a single prefilled syringe for manual use. What may interact with this medicine? Interactions have not been studied. Give your health care provider a list of all the medicines, herbs, non-prescription drugs, or dietary supplements you use. Also tell them if you smoke, drink alcohol, or use illegal drugs. Some items may interact with your medicine. This list may not describe all possible interactions. Give your health care provider a list of all the medicines, herbs, non-prescription drugs, or dietary supplements you use. Also tell them if you smoke, drink alcohol, or use illegal drugs. Some items may interact with your medicine. What should I watch for while using this medicine? You may need blood work done while you are taking this medicine. If you are going to need a MRI, CT scan, or other procedure, tell your doctor that you are using this medicine (On-Body Injector only). What side effects may I notice from receiving this medicine? Side effects that you should report to your doctor or health care professional as soon as possible: -allergic reactions like skin rash, itching or hives, swelling of the face, lips, or tongue -dizziness -fever -pain, redness, or irritation at site where injected -pinpoint red spots on the skin -red or dark-brown urine -shortness of breath or breathing problems -stomach or side pain, or pain   at the shoulder -swelling -tiredness -trouble passing urine or change in the amount of urine Side effects that usually do not require medical attention (report to your doctor or health care professional if they continue or are  bothersome): -bone pain -muscle pain This list may not describe all possible side effects. Call your doctor for medical advice about side effects. You may report side effects to FDA at 1-800-FDA-1088. Where should I keep my medicine? Keep out of the reach of children. Store pre-filled syringes in a refrigerator between 2 and 8 degrees C (36 and 46 degrees F). Do not freeze. Keep in carton to protect from light. Throw away this medicine if it is left out of the refrigerator for more than 48 hours. Throw away any unused medicine after the expiration date. NOTE: This sheet is a summary. It may not cover all possible information. If you have questions about this medicine, talk to your doctor, pharmacist, or health care provider.    2016, Elsevier/Gold Standard. (2014-03-28 14:30:14)  

## 2015-08-30 NOTE — Progress Notes (Signed)
See documentation from pump d/c encounter on 08/30/15.

## 2015-09-07 ENCOUNTER — Other Ambulatory Visit: Payer: Self-pay | Admitting: Oncology

## 2015-09-11 ENCOUNTER — Telehealth: Payer: Self-pay | Admitting: Nurse Practitioner

## 2015-09-11 ENCOUNTER — Ambulatory Visit (HOSPITAL_BASED_OUTPATIENT_CLINIC_OR_DEPARTMENT_OTHER): Payer: BC Managed Care – PPO

## 2015-09-11 ENCOUNTER — Telehealth: Payer: Self-pay | Admitting: *Deleted

## 2015-09-11 ENCOUNTER — Ambulatory Visit (HOSPITAL_BASED_OUTPATIENT_CLINIC_OR_DEPARTMENT_OTHER): Payer: BC Managed Care – PPO | Admitting: Oncology

## 2015-09-11 ENCOUNTER — Other Ambulatory Visit (HOSPITAL_BASED_OUTPATIENT_CLINIC_OR_DEPARTMENT_OTHER): Payer: BC Managed Care – PPO

## 2015-09-11 VITALS — BP 106/88 | HR 72 | Temp 98.4°F | Resp 18 | Ht 70.0 in | Wt 157.1 lb

## 2015-09-11 DIAGNOSIS — C787 Secondary malignant neoplasm of liver and intrahepatic bile duct: Secondary | ICD-10-CM

## 2015-09-11 DIAGNOSIS — C20 Malignant neoplasm of rectum: Secondary | ICD-10-CM | POA: Diagnosis not present

## 2015-09-11 DIAGNOSIS — R21 Rash and other nonspecific skin eruption: Secondary | ICD-10-CM | POA: Diagnosis not present

## 2015-09-11 DIAGNOSIS — Z5111 Encounter for antineoplastic chemotherapy: Secondary | ICD-10-CM | POA: Diagnosis not present

## 2015-09-11 LAB — CBC WITH DIFFERENTIAL/PLATELET
BASO%: 0 % (ref 0.0–2.0)
BASOS ABS: 0 10*3/uL (ref 0.0–0.1)
EOS ABS: 0.1 10*3/uL (ref 0.0–0.5)
EOS%: 4.2 % (ref 0.0–7.0)
HCT: 35.7 % — ABNORMAL LOW (ref 38.4–49.9)
HGB: 11.4 g/dL — ABNORMAL LOW (ref 13.0–17.1)
LYMPH%: 22.4 % (ref 14.0–49.0)
MCH: 29.4 pg (ref 27.2–33.4)
MCHC: 31.9 g/dL — AB (ref 32.0–36.0)
MCV: 92 fL (ref 79.3–98.0)
MONO#: 0.2 10*3/uL (ref 0.1–0.9)
MONO%: 8.9 % (ref 0.0–14.0)
NEUT%: 64.5 % (ref 39.0–75.0)
NEUTROS ABS: 1.7 10*3/uL (ref 1.5–6.5)
PLATELETS: 142 10*3/uL (ref 140–400)
RBC: 3.88 10*6/uL — AB (ref 4.20–5.82)
RDW: 15.8 % — ABNORMAL HIGH (ref 11.0–14.6)
WBC: 2.6 10*3/uL — ABNORMAL LOW (ref 4.0–10.3)
lymph#: 0.6 10*3/uL — ABNORMAL LOW (ref 0.9–3.3)

## 2015-09-11 LAB — COMPREHENSIVE METABOLIC PANEL
ALT: 31 U/L (ref 0–55)
AST: 16 U/L (ref 5–34)
Albumin: 3.1 g/dL — ABNORMAL LOW (ref 3.5–5.0)
Alkaline Phosphatase: 78 U/L (ref 40–150)
Anion Gap: 8 mEq/L (ref 3–11)
BUN: 8.3 mg/dL (ref 7.0–26.0)
CHLORIDE: 108 meq/L (ref 98–109)
CO2: 27 meq/L (ref 22–29)
CREATININE: 0.8 mg/dL (ref 0.7–1.3)
Calcium: 8.8 mg/dL (ref 8.4–10.4)
EGFR: >90 mL/min/{1.73_m2} (ref >90)
GLUCOSE: 104 mg/dL (ref 70–140)
Potassium: 3.6 mEq/L (ref 3.5–5.1)
Sodium: 142 mEq/L (ref 136–145)
TOTAL PROTEIN: 6.1 g/dL — AB (ref 6.4–8.3)

## 2015-09-11 MED ORDER — HEPARIN SOD (PORK) LOCK FLUSH 100 UNIT/ML IV SOLN
500.0000 [IU] | Freq: Once | INTRAVENOUS | Status: DC | PRN
  Filled 2015-09-11: qty 5

## 2015-09-11 MED ORDER — ATROPINE SULFATE 1 MG/ML IJ SOLN
INTRAMUSCULAR | Status: AC
  Filled 2015-09-11: qty 1

## 2015-09-11 MED ORDER — PALONOSETRON HCL INJECTION 0.25 MG/5ML
INTRAVENOUS | Status: AC
  Filled 2015-09-11: qty 5

## 2015-09-11 MED ORDER — LEUCOVORIN CALCIUM INJECTION 350 MG
398.0000 mg/m2 | Freq: Once | INTRAVENOUS | Status: AC
  Administered 2015-09-11: 760 mg via INTRAVENOUS
  Filled 2015-09-11: qty 38

## 2015-09-11 MED ORDER — FLUOROURACIL CHEMO INJECTION 2.5 GM/50ML
400.0000 mg/m2 | Freq: Once | INTRAVENOUS | Status: AC
  Administered 2015-09-11: 750 mg via INTRAVENOUS
  Filled 2015-09-11: qty 15

## 2015-09-11 MED ORDER — FLUOROURACIL CHEMO INJECTION 5 GM/100ML
2400.0000 mg/m2 | INTRAVENOUS | Status: DC
  Administered 2015-09-11: 4600 mg via INTRAVENOUS
  Filled 2015-09-11: qty 92

## 2015-09-11 MED ORDER — SODIUM CHLORIDE 0.9 % IJ SOLN
10.0000 mL | INTRAMUSCULAR | Status: DC | PRN
  Filled 2015-09-11: qty 10

## 2015-09-11 MED ORDER — SODIUM CHLORIDE 0.9 % IV SOLN
Freq: Once | INTRAVENOUS | Status: AC
  Administered 2015-09-11: 13:00:00 via INTRAVENOUS
  Filled 2015-09-11: qty 5

## 2015-09-11 MED ORDER — SODIUM CHLORIDE 0.9 % IV SOLN
Freq: Once | INTRAVENOUS | Status: AC
  Administered 2015-09-11: 13:00:00 via INTRAVENOUS

## 2015-09-11 MED ORDER — IRINOTECAN HCL CHEMO INJECTION 100 MG/5ML
136.0000 mg/m2 | Freq: Once | INTRAVENOUS | Status: AC
  Administered 2015-09-11: 260 mg via INTRAVENOUS
  Filled 2015-09-11: qty 4

## 2015-09-11 MED ORDER — PALONOSETRON HCL INJECTION 0.25 MG/5ML
0.2500 mg | Freq: Once | INTRAVENOUS | Status: AC
  Administered 2015-09-11: 0.25 mg via INTRAVENOUS

## 2015-09-11 NOTE — Progress Notes (Signed)
[facility] OFFICE PROGRESS NOTE   Diagnosis: Rectal cancer  INTERVAL HISTORY:   Jacob [name] returns as scheduled. He completed another cycle of FOLFIRI/panitumumab on 08/28/2015. He continues to have irregular bowel habits. No mouth sores or nausea/vomiting. The panitumumab skin rash returned over the scalp, chest, and upper extremities. The rash was much less severe compared to with previous therapy and has lessened over the past week.  Objective:  Vital signs in last 24 hours:  Blood pressure 106/88, pulse 72, temperature 98.4 F (36.9 C), temperature source Oral, resp. rate 18, height '5\' 10"'  (1.778 m), weight 157 lb 1.6 oz (71.26 kg), SpO2 100 %.    HEENT: No thrush or ulcers Resp: Lungs clear bilaterally Cardio: Regular rate and rhythm GI: No hepatomegaly, nontender Vascular: No leg edema  Skin: Mild acne type rash over the chest, neck, and face. A few areas of resolving paronychia at the hands.   Portacath/PICC-without erythema  Lab Results:  Lab Results  Component Value Date   WBC 2.6* 09/11/2015   HGB 11.4* 09/11/2015   HCT 35.7* 09/11/2015   MCV 92.0 09/11/2015   PLT 142 09/11/2015   NEUTROABS 1.7 09/11/2015      Lab Results  Component Value Date   CEA1 1.8 08/07/2015     Medications: I have reviewed the patient's current medications.  Assessment/Plan: 1. Rectal cancer. Partially obstructing mass noted 1-2 cm from the anal verge on a colonoscopy 03/06/2012. Endoscopic ultrasound 03/14/2012 with a 7.5 mm thick, 3.2 cm wide hypoechoic, irregularly bordered mass that clearly passed into and through the muscularis propria layer of the distal rectal wall (uT3); 3 small (largest 7 mm) perirectal lymph nodes. The lymph nodes were all round, discrete, hypoechoic, homogenous; suspicious for malignant involvement (uN1).  No RAS mutation identified by [facility] 1 testing on the colon resection specimen 06/29/2012, APC alteration identified,  microsatellite stable  He began radiation and concurrent Xeloda chemotherapy on 03/20/2012, completed 04/27/2012.   Low anterior resection/coloanal anastomosis and diverting ileostomy 06/29/2012 with the final pathology revealing a T2N0 tumor with extensive fibrosis and negative margins.   Cycle 1 of adjuvant CAPOX 07/19/2012. Cycle 5 of adjuvant CAPOX 10/11/2012.   CEA 2.5 on 11/14/2012.   CEA 14.6 03/08/2013.   Restaging CT evaluation 03/08/2013 with a 4 mm pulmonary nodule left lung base not identified on comparison exam; new liver lesions including a 29 x 26 mm irregular peripheral enhancing rounded lesion in the dome of the right hepatic lobe, a less well-defined new subcapsular lesion in the lateral right hepatic lobe measuring 12 mm, a new subcapsular lesion in the anterior right hepatic lobe adjacent to the gallbladder fossa measuring 10 mm; a rounded low-density lesion in the inferior right hepatic lobe measuring 10 mm compared to 7 mm on the prior study (radiologist commented this may represent an enlarged cyst).   MRI of the abdomen 04/12/2013 confirmed multiple T2 hyperintense metastatic lesions throughout the right liver   Initiation of FOLFIRI/Avastin with genotype based irinotecan dosing per the [facility] study 04/05/2013.   Restaging CT evaluation on 06/20/2013 (after 2 cycles/4 treatments) showed improvement in the liver metastases and stable size of a left lower lobe pulmonary nodule now with central cavitation.   Continuation of FOLFIRI/Avastin.   Restaging CT evaluation 08/14/2013-decrease in the size of liver metastases, slight decrease in the size of a cavitary left lower lobe nodule, no evidence of disease progression.   Status post right hepatic lobectomy 09/17/2013. Pathology showed multiple foci of metastatic  adenocarcinoma (5 nodules of metastatic adenocarcinoma 4 of which are subcapsular with the nodules ranging in size from 0.6-1.8 cm in greatest dimension).  Margins not involved. Biopsy of a portal lymph node showed benign adipose tissue; no lymph node tissue or malignancy.   Normal CEA 11/06/2013   CT 11/06/2013 with a new pleural-based right lower chest lesion, slight in enlargement of the a left-sided lung nodule  CT 01/29/2014 with a decrease in the right lower chest pleural-based lesion and a slight increase of a left-sided lung lesion, other lung lesions were stable  CT chest 05/02/2014 with a decrease in the size of a right lower lobe pulmonary nodule months similar size of a dominant left lower lobe nodule, minimal enlargement of a smaller left lower lobe nodule, no new site of disease  CT chest 11/07/2014 with a slight increase in several left-sided lung nodules  CT chest 02/10/2015 with new small left hilar lymph nodes, a possible new left lower lobe nodule, and stability of other lung nodules  PET scan 03/12/2015 with hypermetabolic left hilar nodes, hypermetabolic left lower lobe nodules, hyper metabolic retroperitoneal nodes, intense hypermetabolism at the coloanal anastomosis, and hypermetabolic thickening in the presacral space  Cycle 1 FOLFIRI/PANITUMUMAB 05/21/2015  Cycle 2 FOLFIRI/PANITUMUMAB 06/05/2015  Cycle 3 FOLFIRI/PANITUMUMAB 06/19/2015  Cycle 4 FOLFIRI 07/10/2015  Cycle 5 FOLFIRI 07/24/2015  Restaging CTs 08/06/2015-resolution of hilar/retroperitoneal adenopathy, improvement in the hypermetabolic lung nodule, other lung nodules are stable, no new lesions  Cycle 6 FOLFIRI with PANITUMUMAB 08/28/2015 -panitumumab dose reduced  Cycle 7 FOLFIRI 09/11/2015-no panitumumab given 2. Irregular bowel habits/rectal bleeding secondary to #1. 3. History of Mild elevation of the liver enzymes. Question secondary to hepatic steatosis. 4. Indeterminate 8 mm posterior right liver lesion on the staging CT 03/06/2012. 5. Mildly elevated CEA at 5.7 on 03/06/2012. 6. History of radiation erythema at the groin and  perineum 7. 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 No clinical evidence of thrombophlebitis or extravasation. 8. Delayed nausea following chemotherapy-Decadron prophylaxis was added with cycle 3 CAPOX-improved. 9. Ileostomy takedown 12/07/2012. 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 11. Port-A-Cath placement 12/31/204 12. Severe neutropenia secondary to chemotherapy following cycle 1 of FOLFIRI, chemotherapy was dose reduced and he received Neulasta with day 15 cycle 1  13. Nausea and vomiting following cycle 1 of FOLFIRI 14. Rectal stricture 15. Leukopenia/Thrombocytopenia-persistent, potentially a sequelae of chemotherapy or hepatic toxicity from chemotherapy/radiation. Bone marrow biopsy 11/20/2014 showed cellular bone marrow with trilineage hematopoiesis. Significant dyspoiesis was not present and there was no evidence of metastatic carcinoma. Cytogenetic analysis showed the presence of normal male chromosomes with no observable clonal chromosomal abnormalities.  probable cirrhosis 16. Genetic testing-negative genetic panel in March 2014 17. 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 Severe over the face-steroid Dosepak prescribed 07/03/2015. Improved 07/10/2015. Further improved 07/24/2015, 08/07/2015, 08/28/2015 18. Diarrhea secondary to chemotherapy-encouraged to use Imodium 07/03/2015 19. Paronychia secondary to [facility]   Disposition:  Jacob [name] tolerated the last cycle of FOLFIRI/panitumumab well. He has mild skin toxicity today. He will like to delay the next treatment with panitumumab until he returns for reassessment in 2 weeks.  The plan is to proceed with a cycle of FOLFIRI today. He will return for an office visit and chemotherapy in 2 weeks.  Betsy Coder, MD  09/11/2015  12:16 PM

## 2015-09-11 NOTE — Telephone Encounter (Signed)
Per staff message and POF I have scheduled appts. Advised scheduler of appts. JMW  

## 2015-09-11 NOTE — Patient Instructions (Signed)
[place] Discharge Instructions for Patients Receiving Chemotherapy  Today you received the following chemotherapy agents FOLFIRI/Vectibix To help prevent nausea and vomiting after your treatment, we encourage you to take your nausea medication as needed   If you develop nausea and vomiting that is not controlled by your nausea medication, call the clinic.   BELOW ARE SYMPTOMS THAT SHOULD BE REPORTED IMMEDIATELY:  *FEVER GREATER THAN 100.5 F  *CHILLS WITH OR WITHOUT FEVER  NAUSEA AND VOMITING THAT IS NOT CONTROLLED WITH YOUR NAUSEA MEDICATION  *UNUSUAL SHORTNESS OF BREATH  *UNUSUAL BRUISING OR BLEEDING  TENDERNESS IN MOUTH AND THROAT WITH OR WITHOUT PRESENCE OF ULCERS  *URINARY PROBLEMS  *BOWEL PROBLEMS  UNUSUAL RASH Items with * indicate a potential emergency and should be followed up as soon as possible.  Feel free to call the clinic you have any questions or concerns. The clinic phone number is [phone].  Please show the [facility] at check-in to the Emergency Department and triage nurse.

## 2015-09-11 NOTE — Telephone Encounter (Signed)
Pt will get sched in tx room

## 2015-09-12 ENCOUNTER — Ambulatory Visit: Payer: BC Managed Care – PPO

## 2015-09-13 ENCOUNTER — Ambulatory Visit: Payer: BC Managed Care – PPO

## 2015-09-13 ENCOUNTER — Ambulatory Visit (HOSPITAL_BASED_OUTPATIENT_CLINIC_OR_DEPARTMENT_OTHER): Payer: BC Managed Care – PPO

## 2015-09-13 VITALS — BP 139/80 | HR 79 | Temp 98.0°F | Resp 18

## 2015-09-13 DIAGNOSIS — C787 Secondary malignant neoplasm of liver and intrahepatic bile duct: Secondary | ICD-10-CM

## 2015-09-13 DIAGNOSIS — C20 Malignant neoplasm of rectum: Secondary | ICD-10-CM | POA: Diagnosis not present

## 2015-09-13 DIAGNOSIS — Z5189 Encounter for other specified aftercare: Secondary | ICD-10-CM

## 2015-09-13 MED ORDER — SODIUM CHLORIDE 0.9 % IJ SOLN
10.0000 mL | INTRAMUSCULAR | Status: DC | PRN
  Administered 2015-09-13: 10 mL
  Filled 2015-09-13: qty 10

## 2015-09-13 MED ORDER — HEPARIN SOD (PORK) LOCK FLUSH 100 UNIT/ML IV SOLN
500.0000 [IU] | Freq: Once | INTRAVENOUS | Status: AC | PRN
  Administered 2015-09-13: 500 [IU]
  Filled 2015-09-13: qty 5

## 2015-09-13 MED ORDER — PEGFILGRASTIM INJECTION 6 MG/0.6ML ~~LOC~~
6.0000 mg | PREFILLED_SYRINGE | Freq: Once | SUBCUTANEOUS | Status: AC
  Administered 2015-09-13: 6 mg via SUBCUTANEOUS

## 2015-09-13 NOTE — Progress Notes (Signed)
Charted on pump stop encounter

## 2015-09-13 NOTE — Progress Notes (Signed)
2 mL left on pump - bolused.

## 2015-09-25 ENCOUNTER — Telehealth: Payer: Self-pay | Admitting: Oncology

## 2015-09-25 ENCOUNTER — Ambulatory Visit: Payer: BC Managed Care – PPO

## 2015-09-25 ENCOUNTER — Other Ambulatory Visit: Payer: BC Managed Care – PPO

## 2015-09-25 ENCOUNTER — Other Ambulatory Visit (HOSPITAL_BASED_OUTPATIENT_CLINIC_OR_DEPARTMENT_OTHER): Payer: BC Managed Care – PPO

## 2015-09-25 ENCOUNTER — Ambulatory Visit (HOSPITAL_BASED_OUTPATIENT_CLINIC_OR_DEPARTMENT_OTHER): Payer: BC Managed Care – PPO

## 2015-09-25 ENCOUNTER — Other Ambulatory Visit: Payer: Self-pay | Admitting: Oncology

## 2015-09-25 ENCOUNTER — Ambulatory Visit (HOSPITAL_BASED_OUTPATIENT_CLINIC_OR_DEPARTMENT_OTHER): Payer: BC Managed Care – PPO | Admitting: Nurse Practitioner

## 2015-09-25 VITALS — BP 122/82 | HR 84 | Temp 98.9°F | Resp 18 | Ht 70.0 in | Wt 159.0 lb

## 2015-09-25 DIAGNOSIS — C787 Secondary malignant neoplasm of liver and intrahepatic bile duct: Secondary | ICD-10-CM | POA: Diagnosis not present

## 2015-09-25 DIAGNOSIS — C20 Malignant neoplasm of rectum: Secondary | ICD-10-CM

## 2015-09-25 DIAGNOSIS — Z95828 Presence of other vascular implants and grafts: Secondary | ICD-10-CM

## 2015-09-25 DIAGNOSIS — Z5112 Encounter for antineoplastic immunotherapy: Secondary | ICD-10-CM | POA: Diagnosis not present

## 2015-09-25 LAB — COMPREHENSIVE METABOLIC PANEL
ALT: 18 U/L (ref 0–55)
ANION GAP: 9 meq/L (ref 3–11)
AST: 14 U/L (ref 5–34)
Albumin: 3.3 g/dL — ABNORMAL LOW (ref 3.5–5.0)
Alkaline Phosphatase: 75 U/L (ref 40–150)
BUN: 14.3 mg/dL (ref 7.0–26.0)
CHLORIDE: 109 meq/L (ref 98–109)
CO2: 24 meq/L (ref 22–29)
CREATININE: 1 mg/dL (ref 0.7–1.3)
Calcium: 8.9 mg/dL (ref 8.4–10.4)
EGFR: >90 mL/min/{1.73_m2} (ref >90)
Glucose: 106 mg/dl (ref 70–140)
Potassium: 3.7 mEq/L (ref 3.5–5.1)
Sodium: 142 mEq/L (ref 136–145)
Total Bilirubin: <0.3 mg/dL (ref 0.20–1.20)
Total Protein: 6.3 g/dL — ABNORMAL LOW (ref 6.4–8.3)

## 2015-09-25 LAB — CBC WITH DIFFERENTIAL/PLATELET
BASO%: 0.8 % (ref 0.0–2.0)
BASOS ABS: 0 10*3/uL (ref 0.0–0.1)
EOS ABS: 0.1 10*3/uL (ref 0.0–0.5)
EOS%: 1.3 % (ref 0.0–7.0)
HEMATOCRIT: 36.6 % — AB (ref 38.4–49.9)
HEMOGLOBIN: 11.7 g/dL — AB (ref 13.0–17.1)
LYMPH#: 0.7 10*3/uL — AB (ref 0.9–3.3)
LYMPH%: 14.3 % (ref 14.0–49.0)
MCH: 28.7 pg (ref 27.2–33.4)
MCHC: 31.9 g/dL — ABNORMAL LOW (ref 32.0–36.0)
MCV: 89.9 fL (ref 79.3–98.0)
MONO#: 0.5 10*3/uL (ref 0.1–0.9)
MONO%: 10.7 % (ref 0.0–14.0)
NEUT%: 72.9 % (ref 39.0–75.0)
NEUTROS ABS: 3.6 10*3/uL (ref 1.5–6.5)
Platelets: 131 10*3/uL — ABNORMAL LOW (ref 140–400)
RBC: 4.07 10*6/uL — ABNORMAL LOW (ref 4.20–5.82)
RDW: 17.2 % — AB (ref 11.0–14.6)
WBC: 5 10*3/uL (ref 4.0–10.3)

## 2015-09-25 LAB — MAGNESIUM: MAGNESIUM: 2.5 mg/dL (ref 1.5–2.5)

## 2015-09-25 MED ORDER — SODIUM CHLORIDE 0.9 % IJ SOLN
10.0000 mL | INTRAMUSCULAR | Status: DC | PRN
Start: 2015-09-25 — End: 2015-09-25
  Administered 2015-09-25: 10 mL via INTRAVENOUS
  Filled 2015-09-25: qty 10

## 2015-09-25 MED ORDER — SODIUM CHLORIDE 0.9 % IV SOLN
Freq: Once | INTRAVENOUS | Status: AC
  Administered 2015-09-25: 11:00:00 via INTRAVENOUS
  Filled 2015-09-25: qty 5

## 2015-09-25 MED ORDER — SODIUM CHLORIDE 0.9 % IV SOLN
3.0000 mg/kg | Freq: Once | INTRAVENOUS | Status: AC
  Administered 2015-09-25: 220 mg via INTRAVENOUS
  Filled 2015-09-25: qty 11

## 2015-09-25 MED ORDER — FLUOROURACIL CHEMO INJECTION 2.5 GM/50ML
400.0000 mg/m2 | Freq: Once | INTRAVENOUS | Status: AC
  Administered 2015-09-25: 750 mg via INTRAVENOUS
  Filled 2015-09-25: qty 15

## 2015-09-25 MED ORDER — LEUCOVORIN CALCIUM INJECTION 350 MG
398.0000 mg/m2 | Freq: Once | INTRAVENOUS | Status: AC
  Administered 2015-09-25: 760 mg via INTRAVENOUS
  Filled 2015-09-25: qty 38

## 2015-09-25 MED ORDER — HEPARIN SOD (PORK) LOCK FLUSH 100 UNIT/ML IV SOLN
500.0000 [IU] | Freq: Once | INTRAVENOUS | Status: DC | PRN
  Filled 2015-09-25: qty 5

## 2015-09-25 MED ORDER — PALONOSETRON HCL INJECTION 0.25 MG/5ML
INTRAVENOUS | Status: AC
  Filled 2015-09-25: qty 5

## 2015-09-25 MED ORDER — SODIUM CHLORIDE 0.9 % IV SOLN: Freq: Once | INTRAVENOUS | Status: DC

## 2015-09-25 MED ORDER — SODIUM CHLORIDE 0.9 % IJ SOLN
10.0000 mL | INTRAMUSCULAR | Status: DC | PRN
  Filled 2015-09-25: qty 10

## 2015-09-25 MED ORDER — PALONOSETRON HCL INJECTION 0.25 MG/5ML
0.2500 mg | Freq: Once | INTRAVENOUS | Status: AC
  Administered 2015-09-25: 0.25 mg via INTRAVENOUS

## 2015-09-25 MED ORDER — DEXTROSE 5 % IV SOLN
136.0000 mg/m2 | Freq: Once | INTRAVENOUS | Status: AC
  Administered 2015-09-25: 260 mg via INTRAVENOUS
  Filled 2015-09-25: qty 10

## 2015-09-25 MED ORDER — SODIUM CHLORIDE 0.9 % IV SOLN
2400.0000 mg/m2 | INTRAVENOUS | Status: DC
  Administered 2015-09-25: 4600 mg via INTRAVENOUS
  Filled 2015-09-25: qty 92

## 2015-09-25 MED ORDER — SODIUM CHLORIDE 0.9 % IV SOLN
Freq: Once | INTRAVENOUS | Status: AC
  Administered 2015-09-25: 11:00:00 via INTRAVENOUS

## 2015-09-25 NOTE — Patient Instructions (Signed)

## 2015-09-25 NOTE — Patient Instructions (Signed)
[facility] Discharge Instructions for Patients Receiving Chemotherapy  Today you received the following chemotherapy agents:  Vectibix, Irinotecan, 5FU, and Leucovorin.  To help prevent nausea and vomiting after your treatment, we encourage you to take your nausea medication as directed.   If you develop nausea and vomiting that is not controlled by your nausea medication, call the clinic.   BELOW ARE SYMPTOMS THAT SHOULD BE REPORTED IMMEDIATELY:  *FEVER GREATER THAN 100.5 F  *CHILLS WITH OR WITHOUT FEVER  NAUSEA AND VOMITING THAT IS NOT CONTROLLED WITH YOUR NAUSEA MEDICATION  *UNUSUAL SHORTNESS OF BREATH  *UNUSUAL BRUISING OR BLEEDING  TENDERNESS IN MOUTH AND THROAT WITH OR WITHOUT PRESENCE OF ULCERS  *URINARY PROBLEMS  *BOWEL PROBLEMS  UNUSUAL RASH Items with * indicate a potential emergency and should be followed up as soon as possible.  Feel free to call the clinic you have any questions or concerns. The clinic phone number is [phone].  Please show the [place] at check-in to the Emergency Department and triage nurse.

## 2015-09-25 NOTE — Progress Notes (Signed)
[facility] OFFICE PROGRESS NOTE   Diagnosis:  Rectal cancer  INTERVAL HISTORY:   Jacob [name] returns as scheduled. He completed cycle 7 FOLFIRI 09/11/2015. He did not receive PANITUMUMAB. The rash is much better. He denies nausea/vomiting. No mouth sores. He continues to have irregular bowel habits and "gas". He intermittently coughs. He reports a recent upper respiratory infection. No fever. No shortness of breath. His wife has had similar symptoms.  Objective:  Vital signs in last 24 hours:  Blood pressure 122/82, pulse 84, temperature 98.9 F (37.2 C), temperature source Oral, resp. rate 18, height _0  (1.778 m), weight 159 lb (72.122 kg), SpO2 100 %.    HEENT: No thrush or ulcers. Resp: Lungs clear bilaterally. Cardio: Regular rate and rhythm. GI: Abdomen soft and nontender. No hepatomegaly. Vascular: No leg edema. Skin: A few acne-type skin lesions on the back.  Port-A-Cath without erythema.  Lab Results:  Lab Results  Component Value Date   WBC 5.0 09/25/2015   HGB 11.7* 09/25/2015   HCT 36.6* 09/25/2015   MCV 89.9 09/25/2015   PLT 131* 09/25/2015   NEUTROABS 3.6 09/25/2015    Imaging:  No results found.  Medications: I have reviewed the patient's current medications.  Assessment/Plan: 1. Rectal cancer. Partially obstructing mass noted 1-2 cm from the anal verge on a colonoscopy 03/06/2012. Endoscopic ultrasound 03/14/2012 with a 7.5 mm thick, 3.2 cm wide hypoechoic, irregularly bordered mass that clearly passed into and through the muscularis propria layer of the distal rectal wall (uT3); 3 small (largest 7 mm) perirectal lymph nodes. The lymph nodes were all round, discrete, hypoechoic, homogenous; suspicious for malignant involvement (uN1).  No RAS mutation identified by [facility] 1 testing on the colon resection specimen 06/29/2012, APC alteration identified, microsatellite stable  He began radiation and concurrent Xeloda chemotherapy on  03/20/2012, completed 04/27/2012.   Low anterior resection/coloanal anastomosis and diverting ileostomy 06/29/2012 with the final pathology revealing a T2N0 tumor with extensive fibrosis and negative margins.   Cycle 1 of adjuvant CAPOX 07/19/2012. Cycle 5 of adjuvant CAPOX 10/11/2012.   CEA 2.5 on 11/14/2012.   CEA 14.6 03/08/2013.   Restaging CT evaluation 03/08/2013 with a 4 mm pulmonary nodule left lung base not identified on comparison exam; new liver lesions including a 29 x 26 mm irregular peripheral enhancing rounded lesion in the dome of the right hepatic lobe, a less well-defined new subcapsular lesion in the lateral right hepatic lobe measuring 12 mm, a new subcapsular lesion in the anterior right hepatic lobe adjacent to the gallbladder fossa measuring 10 mm; a rounded low-density lesion in the inferior right hepatic lobe measuring 10 mm compared to 7 mm on the prior study (radiologist commented this may represent an enlarged cyst).   MRI of the abdomen 04/12/2013 confirmed multiple T2 hyperintense metastatic lesions throughout the right liver   Initiation of FOLFIRI/Avastin with genotype based irinotecan dosing per the [facility] study 04/05/2013.   Restaging CT evaluation on 06/20/2013 (after 2 cycles/4 treatments) showed improvement in the liver metastases and stable size of a left lower lobe pulmonary nodule now with central cavitation.   Continuation of FOLFIRI/Avastin.   Restaging CT evaluation 08/14/2013-decrease in the size of liver metastases, slight decrease in the size of a cavitary left lower lobe nodule, no evidence of disease progression.   Status post right hepatic lobectomy 09/17/2013. Pathology showed multiple foci of metastatic adenocarcinoma (5 nodules of metastatic adenocarcinoma 4 of which are subcapsular with the nodules ranging in size from  0.6-1.8 cm in greatest dimension). Margins not involved. Biopsy of a portal lymph node showed benign adipose tissue;  no lymph node tissue or malignancy.   Normal CEA 11/06/2013   CT 11/06/2013 with a new pleural-based right lower chest lesion, slight in enlargement of the a left-sided lung nodule  CT 01/29/2014 with a decrease in the right lower chest pleural-based lesion and a slight increase of a left-sided lung lesion, other lung lesions were stable  CT chest 05/02/2014 with a decrease in the size of a right lower lobe pulmonary nodule months similar size of a dominant left lower lobe nodule, minimal enlargement of a smaller left lower lobe nodule, no new site of disease  CT chest 11/07/2014 with a slight increase in several left-sided lung nodules  CT chest 02/10/2015 with new small left hilar lymph nodes, a possible new left lower lobe nodule, and stability of other lung nodules  PET scan 03/12/2015 with hypermetabolic left hilar nodes, hypermetabolic left lower lobe nodules, hyper metabolic retroperitoneal nodes, intense hypermetabolism at the coloanal anastomosis, and hypermetabolic thickening in the presacral space  Cycle 1 FOLFIRI/PANITUMUMAB 05/21/2015  Cycle 2 FOLFIRI/PANITUMUMAB 06/05/2015  Cycle 3 FOLFIRI/PANITUMUMAB 06/19/2015  Cycle 4 FOLFIRI 07/10/2015  Cycle 5 FOLFIRI 07/24/2015  Restaging CTs 08/06/2015-resolution of hilar/retroperitoneal adenopathy, improvement in the hypermetabolic lung nodule, other lung nodules are stable, no new lesions  Cycle 6 FOLFIRI with PANITUMUMAB 08/28/2015 -panitumumab dose reduced  Cycle 7 FOLFIRI 09/11/2015-no panitumumab given  Cycle 8 FOLFIRI with PANITUMUMAB 09/25/2015-PANITUMUMAB dose reduced 2. Irregular bowel habits/rectal bleeding secondary to #1. 3. History of Mild elevation of the liver enzymes. Question secondary to hepatic steatosis. 4. Indeterminate 8 mm posterior right liver lesion on the staging CT 03/06/2012. 5. Mildly elevated CEA at 5.7 on 03/06/2012. 6. History of radiation erythema at the groin and perineum 7. Right  hand/arm tenderness and numbness following cycle 1 oxaliplatin-likely related to a local toxicity from oxaliplatin/neuropathy. No clinical evidence of thrombophlebitis or extravasation. 8. Delayed nausea following chemotherapy-Decadron prophylaxis was added with cycle 3 CAPOX-improved. 9. Ileostomy takedown 12/07/2012. 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 11. Port-A-Cath placement 12/31/204 12. Severe neutropenia secondary to chemotherapy following cycle 1 of FOLFIRI, chemotherapy was dose reduced and he received Neulasta with day 15 cycle 1  13. Nausea and vomiting following cycle 1 of FOLFIRI 14. Rectal stricture 15. Leukopenia/Thrombocytopenia-persistent, potentially a sequelae of chemotherapy or hepatic toxicity from chemotherapy/radiation. Bone marrow biopsy 11/20/2014 showed cellular bone marrow with trilineage hematopoiesis. Significant dyspoiesis was not present and there was no evidence of metastatic carcinoma. Cytogenetic analysis showed the presence of normal male chromosomes with no observable clonal chromosomal abnormalities.  probable cirrhosis 16. Genetic testing-negative genetic panel in March 2014 17. 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 Severe over the face-steroid Dosepak prescribed 07/03/2015. Improved 07/10/2015. Further improved 07/24/2015, 08/07/2015, 08/28/2015 18. Diarrhea secondary to chemotherapy-encouraged to use Imodium 07/03/2015 19. Paronychia secondary to [facility]    Disposition: Mr. Raineri appears stable. He has completed 7 cycles of FOLFIRI. He did not receive PANITUMUMAB with cycle 7. The skin rash is better. Plan to proceed with cycle 8 FOLFIRI and dose reduced PANITUMUMAB today as scheduled. He will return for follow-up visit and the next cycle in 2 weeks. He will contact the office in the interim with any problems.    Ned Card ANP/GNP-BC   09/25/2015  10:01 AM

## 2015-09-25 NOTE — Telephone Encounter (Signed)
Gave and printed appt sched and avs for pt for July and Aug °

## 2015-09-26 LAB — CEA: CEA1: 1.9 ng/mL (ref 0.0–4.7)

## 2015-09-27 ENCOUNTER — Ambulatory Visit: Payer: BC Managed Care – PPO

## 2015-09-27 ENCOUNTER — Ambulatory Visit (HOSPITAL_BASED_OUTPATIENT_CLINIC_OR_DEPARTMENT_OTHER): Payer: BC Managed Care – PPO

## 2015-09-27 VITALS — BP 116/74 | HR 72 | Temp 98.0°F | Resp 16

## 2015-09-27 DIAGNOSIS — C787 Secondary malignant neoplasm of liver and intrahepatic bile duct: Secondary | ICD-10-CM

## 2015-09-27 DIAGNOSIS — C20 Malignant neoplasm of rectum: Secondary | ICD-10-CM | POA: Diagnosis not present

## 2015-09-27 MED ORDER — SODIUM CHLORIDE 0.9 % IJ SOLN
10.0000 mL | INTRAMUSCULAR | Status: DC | PRN
  Administered 2015-09-27: 10 mL
  Filled 2015-09-27: qty 10

## 2015-09-27 MED ORDER — HEPARIN SOD (PORK) LOCK FLUSH 100 UNIT/ML IV SOLN
500.0000 [IU] | Freq: Once | INTRAVENOUS | Status: AC | PRN
  Administered 2015-09-27: 500 [IU]
  Filled 2015-09-27: qty 5

## 2015-09-27 MED ORDER — PEGFILGRASTIM INJECTION 6 MG/0.6ML ~~LOC~~
6.0000 mg | PREFILLED_SYRINGE | Freq: Once | SUBCUTANEOUS | Status: AC
  Administered 2015-09-27: 6 mg via SUBCUTANEOUS

## 2015-09-27 NOTE — Progress Notes (Signed)
Duplicate

## 2015-10-04 ENCOUNTER — Other Ambulatory Visit: Payer: Self-pay | Admitting: Oncology

## 2015-10-09 ENCOUNTER — Ambulatory Visit (HOSPITAL_BASED_OUTPATIENT_CLINIC_OR_DEPARTMENT_OTHER): Payer: BC Managed Care – PPO

## 2015-10-09 ENCOUNTER — Ambulatory Visit: Payer: BC Managed Care – PPO

## 2015-10-09 ENCOUNTER — Ambulatory Visit (HOSPITAL_BASED_OUTPATIENT_CLINIC_OR_DEPARTMENT_OTHER): Payer: BC Managed Care – PPO | Admitting: Nurse Practitioner

## 2015-10-09 ENCOUNTER — Other Ambulatory Visit (HOSPITAL_BASED_OUTPATIENT_CLINIC_OR_DEPARTMENT_OTHER): Payer: BC Managed Care – PPO

## 2015-10-09 VITALS — BP 119/65 | HR 75 | Temp 98.2°F | Resp 20 | Ht 70.0 in | Wt 157.3 lb

## 2015-10-09 DIAGNOSIS — Z95828 Presence of other vascular implants and grafts: Secondary | ICD-10-CM

## 2015-10-09 DIAGNOSIS — C787 Secondary malignant neoplasm of liver and intrahepatic bile duct: Secondary | ICD-10-CM

## 2015-10-09 DIAGNOSIS — Z5111 Encounter for antineoplastic chemotherapy: Secondary | ICD-10-CM | POA: Diagnosis not present

## 2015-10-09 DIAGNOSIS — R21 Rash and other nonspecific skin eruption: Secondary | ICD-10-CM

## 2015-10-09 DIAGNOSIS — C20 Malignant neoplasm of rectum: Secondary | ICD-10-CM

## 2015-10-09 DIAGNOSIS — M25552 Pain in left hip: Secondary | ICD-10-CM

## 2015-10-09 DIAGNOSIS — M25551 Pain in right hip: Secondary | ICD-10-CM

## 2015-10-09 LAB — COMPREHENSIVE METABOLIC PANEL
ALBUMIN: 3.1 g/dL — AB (ref 3.5–5.0)
ALK PHOS: 79 U/L (ref 40–150)
ALT: 32 U/L (ref 0–55)
ANION GAP: 9 meq/L (ref 3–11)
AST: 16 U/L (ref 5–34)
BUN: 7.1 mg/dL (ref 7.0–26.0)
CALCIUM: 8.8 mg/dL (ref 8.4–10.4)
CO2: 24 mEq/L (ref 22–29)
Chloride: 109 mEq/L (ref 98–109)
Creatinine: 0.9 mg/dL (ref 0.7–1.3)
Glucose: 98 mg/dl (ref 70–140)
POTASSIUM: 3.8 meq/L (ref 3.5–5.1)
Sodium: 143 mEq/L (ref 136–145)
Total Bilirubin: <0.3 mg/dL (ref 0.20–1.20)
Total Protein: 6.1 g/dL — ABNORMAL LOW (ref 6.4–8.3)

## 2015-10-09 LAB — CBC WITH DIFFERENTIAL/PLATELET
BASO%: 1.1 % (ref 0.0–2.0)
Basophils Absolute: 0 10*3/uL (ref 0.0–0.1)
EOS ABS: 0 10*3/uL (ref 0.0–0.5)
EOS%: 1.4 % (ref 0.0–7.0)
HEMATOCRIT: 34.5 % — AB (ref 38.4–49.9)
HEMOGLOBIN: 11.1 g/dL — AB (ref 13.0–17.1)
LYMPH#: 0.6 10*3/uL — AB (ref 0.9–3.3)
LYMPH%: 17.5 % (ref 14.0–49.0)
MCH: 28.5 pg (ref 27.2–33.4)
MCHC: 32.2 g/dL (ref 32.0–36.0)
MCV: 88.5 fL (ref 79.3–98.0)
MONO#: 0.4 10*3/uL (ref 0.1–0.9)
MONO%: 12.6 % (ref 0.0–14.0)
NEUT#: 2.2 10*3/uL (ref 1.5–6.5)
NEUT%: 67.4 % (ref 39.0–75.0)
Platelets: 157 10*3/uL (ref 140–400)
RBC: 3.9 10*6/uL — ABNORMAL LOW (ref 4.20–5.82)
RDW: 17 % — ABNORMAL HIGH (ref 11.0–14.6)
WBC: 3.2 10*3/uL — AB (ref 4.0–10.3)

## 2015-10-09 LAB — MAGNESIUM: MAGNESIUM: 2.4 mg/dL (ref 1.5–2.5)

## 2015-10-09 MED ORDER — SODIUM CHLORIDE 0.9 % IJ SOLN
10.0000 mL | INTRAMUSCULAR | Status: DC | PRN
  Administered 2015-10-09: 10 mL via INTRAVENOUS
  Filled 2015-10-09: qty 10

## 2015-10-09 MED ORDER — LEUCOVORIN CALCIUM INJECTION 350 MG
400.0000 mg/m2 | Freq: Once | INTRAMUSCULAR | Status: AC
  Administered 2015-10-09: 764 mg via INTRAVENOUS
  Filled 2015-10-09: qty 38.2

## 2015-10-09 MED ORDER — HEPARIN SOD (PORK) LOCK FLUSH 100 UNIT/ML IV SOLN
500.0000 [IU] | Freq: Once | INTRAVENOUS | Status: DC | PRN
  Filled 2015-10-09: qty 5

## 2015-10-09 MED ORDER — PALONOSETRON HCL INJECTION 0.25 MG/5ML
INTRAVENOUS | Status: AC
  Filled 2015-10-09: qty 5

## 2015-10-09 MED ORDER — PALONOSETRON HCL INJECTION 0.25 MG/5ML
0.2500 mg | Freq: Once | INTRAVENOUS | Status: AC
  Administered 2015-10-09: 0.25 mg via INTRAVENOUS

## 2015-10-09 MED ORDER — SODIUM CHLORIDE 0.9 % IV SOLN
Freq: Once | INTRAVENOUS | Status: AC
  Administered 2015-10-09: 13:00:00 via INTRAVENOUS

## 2015-10-09 MED ORDER — IRINOTECAN HCL CHEMO INJECTION 100 MG/5ML
136.0000 mg/m2 | Freq: Once | INTRAVENOUS | Status: AC
  Administered 2015-10-09: 260 mg via INTRAVENOUS
  Filled 2015-10-09: qty 10

## 2015-10-09 MED ORDER — SODIUM CHLORIDE 0.9 % IJ SOLN
10.0000 mL | INTRAMUSCULAR | Status: DC | PRN
  Filled 2015-10-09: qty 10

## 2015-10-09 MED ORDER — IRINOTECAN HCL CHEMO INJECTION 100 MG/5ML: 135.0000 mg/m2 | Freq: Once | INTRAVENOUS | Status: DC

## 2015-10-09 MED ORDER — SODIUM CHLORIDE 0.9 % IV SOLN
Freq: Once | INTRAVENOUS | Status: AC
  Administered 2015-10-09: 13:00:00 via INTRAVENOUS
  Filled 2015-10-09: qty 5

## 2015-10-09 MED ORDER — FLUOROURACIL CHEMO INJECTION 2.5 GM/50ML
400.0000 mg/m2 | Freq: Once | INTRAVENOUS | Status: AC
  Administered 2015-10-09: 750 mg via INTRAVENOUS
  Filled 2015-10-09: qty 15

## 2015-10-09 MED ORDER — SODIUM CHLORIDE 0.9 % IV SOLN
2400.0000 mg/m2 | INTRAVENOUS | Status: DC
  Administered 2015-10-09: 4600 mg via INTRAVENOUS
  Filled 2015-10-09: qty 92

## 2015-10-09 NOTE — Patient Instructions (Signed)
[facility] Discharge Instructions for Patients Receiving Chemotherapy  Today you received the following chemotherapy agents:Irinotecan, Leucovorin and Adrucil.   To help prevent nausea and vomiting after your treatment, we encourage you to take your nausea medication as directed.    If you develop nausea and vomiting that is not controlled by your nausea medication, call the clinic.   BELOW ARE SYMPTOMS THAT SHOULD BE REPORTED IMMEDIATELY:  *FEVER GREATER THAN 100.5 F  *CHILLS WITH OR WITHOUT FEVER  NAUSEA AND VOMITING THAT IS NOT CONTROLLED WITH YOUR NAUSEA MEDICATION  *UNUSUAL SHORTNESS OF BREATH  *UNUSUAL BRUISING OR BLEEDING  TENDERNESS IN MOUTH AND THROAT WITH OR WITHOUT PRESENCE OF ULCERS  *URINARY PROBLEMS  *BOWEL PROBLEMS  UNUSUAL RASH Items with * indicate a potential emergency and should be followed up as soon as possible.  Feel free to call the clinic you have any questions or concerns. The clinic phone number is [phone].  Please show the CHEMO ALERT CARD at check-in to the Emergency Department and triage nurse.   

## 2015-10-09 NOTE — Patient Instructions (Signed)

## 2015-10-09 NOTE — Progress Notes (Signed)
During Irinotecan infusion, the IV tubing kinked around pole and it was not infusing properly.  IV tubing replaced and infusion resumed now after an unknown amount of time of disruption.

## 2015-10-09 NOTE — Progress Notes (Signed)
[place] OFFICE PROGRESS NOTE   Diagnosis:  Rectal cancer  INTERVAL HISTORY:   [name] returns as scheduled. He completed cycle 8 FOLFIRI and dose reduced PANITUMUMAB 09/25/2015. He denies nausea/vomiting. No mouth sores. He does note some sensitivity in his mouth following treatment. No change in baseline bowel habits. The skin rash has increased. He is not taking minocycline. The rash is mainly present over his scalp and upper chest. Cough has improved. He reports bilateral hip pain about 4 days after each Neulasta injection.  He does not want to take PANITUMUMAB today.  Objective:  Vital signs in last 24 hours:  Blood pressure 119/65, pulse 75, temperature 98.2 F (36.8 C), temperature source Oral, resp. rate 20, height _0  (1.778 m), weight 157 lb 4.8 oz (71.351 kg), SpO2 97 %.    HEENT: No thrush or ulcers. Resp: Lungs clear bilaterally. Cardio: Regular rate and rhythm. GI: Abdomen soft and nontender. No hepatomegaly. Vascular: No leg edema. Skin: Acne-like rash scattered over the scalp, face and upper chest. Port-A-Cath without erythema.    Lab Results:  Lab Results  Component Value Date   WBC 3.2* 10/09/2015   HGB 11.1* 10/09/2015   HCT 34.5* 10/09/2015   MCV 88.5 10/09/2015   PLT 157 10/09/2015   NEUTROABS 2.2 10/09/2015    Imaging:  No results found.  Medications: I have reviewed the patient's current medications.  Assessment/Plan: 1. Rectal cancer. Partially obstructing mass noted 1-2 cm from the anal verge on a colonoscopy 03/06/2012. Endoscopic ultrasound 03/14/2012 with a 7.5 mm thick, 3.2 cm wide hypoechoic, irregularly bordered mass that clearly passed into and through the muscularis propria layer of the distal rectal wall (uT3); 3 small (largest 7 mm) perirectal lymph nodes. The lymph nodes were all round, discrete, hypoechoic, homogenous; suspicious for malignant involvement (uN1).  No RAS mutation identified by [facility] 1  testing on the colon resection specimen 06/29/2012, APC alteration identified, microsatellite stable  He began radiation and concurrent Xeloda chemotherapy on 03/20/2012, completed 04/27/2012.   Low anterior resection/coloanal anastomosis and diverting ileostomy 06/29/2012 with the final pathology revealing a T2N0 tumor with extensive fibrosis and negative margins.   Cycle 1 of adjuvant CAPOX 07/19/2012. Cycle 5 of adjuvant CAPOX 10/11/2012.   CEA 2.5 on 11/14/2012.   CEA 14.6 03/08/2013.   Restaging CT evaluation 03/08/2013 with a 4 mm pulmonary nodule left lung base not identified on comparison exam; new liver lesions including a 29 x 26 mm irregular peripheral enhancing rounded lesion in the dome of the right hepatic lobe, a less well-defined new subcapsular lesion in the lateral right hepatic lobe measuring 12 mm, a new subcapsular lesion in the anterior right hepatic lobe adjacent to the gallbladder fossa measuring 10 mm; a rounded low-density lesion in the inferior right hepatic lobe measuring 10 mm compared to 7 mm on the prior study (radiologist commented this may represent an enlarged cyst).   MRI of the abdomen 04/12/2013 confirmed multiple T2 hyperintense metastatic lesions throughout the right liver   Initiation of FOLFIRI/Avastin with genotype based irinotecan dosing per the [facility] study 04/05/2013.   Restaging CT evaluation on 06/20/2013 (after 2 cycles/4 treatments) showed improvement in the liver metastases and stable size of a left lower lobe pulmonary nodule now with central cavitation.   Continuation of FOLFIRI/Avastin.   Restaging CT evaluation 08/14/2013-decrease in the size of liver metastases, slight decrease in the size of a cavitary left lower lobe nodule, no evidence of disease progression.   Status  post right hepatic lobectomy 09/17/2013. Pathology showed multiple foci of metastatic adenocarcinoma (5 nodules of metastatic adenocarcinoma 4 of which are  subcapsular with the nodules ranging in size from 0.6-1.8 cm in greatest dimension). Margins not involved. Biopsy of a portal lymph node showed benign adipose tissue; no lymph node tissue or malignancy.   Normal CEA 11/06/2013   CT 11/06/2013 with a new pleural-based right lower chest lesion, slight in enlargement of the a left-sided lung nodule  CT 01/29/2014 with a decrease in the right lower chest pleural-based lesion and a slight increase of a left-sided lung lesion, other lung lesions were stable  CT chest 05/02/2014 with a decrease in the size of a right lower lobe pulmonary nodule months similar size of a dominant left lower lobe nodule, minimal enlargement of a smaller left lower lobe nodule, no new site of disease  CT chest 11/07/2014 with a slight increase in several left-sided lung nodules  CT chest 02/10/2015 with new small left hilar lymph nodes, a possible new left lower lobe nodule, and stability of other lung nodules  PET scan 03/12/2015 with hypermetabolic left hilar nodes, hypermetabolic left lower lobe nodules, hyper metabolic retroperitoneal nodes, intense hypermetabolism at the coloanal anastomosis, and hypermetabolic thickening in the presacral space  Cycle 1 FOLFIRI/PANITUMUMAB 05/21/2015  Cycle 2 FOLFIRI/PANITUMUMAB 06/05/2015  Cycle 3 FOLFIRI/PANITUMUMAB 06/19/2015  Cycle 4 FOLFIRI 07/10/2015  Cycle 5 FOLFIRI 07/24/2015  Restaging CTs 08/06/2015-resolution of hilar/retroperitoneal adenopathy, improvement in the hypermetabolic lung nodule, other lung nodules are stable, no new lesions  Cycle 6 FOLFIRI with PANITUMUMAB 08/28/2015 -panitumumab dose reduced  Cycle 7 FOLFIRI 09/11/2015-no panitumumab given  Cycle 8 FOLFIRI with PANITUMUMAB 09/25/2015-PANITUMUMAB dose reduced  Cycle 9 FOLFIRI 10/09/2015-no PANITUMUMAB given 2. Irregular bowel habits/rectal bleeding secondary to #1. 3. History of Mild elevation of the liver enzymes. Question secondary to  hepatic steatosis. 4. Indeterminate 8 mm posterior right liver lesion on the staging CT 03/06/2012. 5. Mildly elevated CEA at 5.7 on 03/06/2012. 6. History of radiation erythema at the groin and perineum 7. 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 No clinical evidence of thrombophlebitis or extravasation. 8. Delayed nausea following chemotherapy-Decadron prophylaxis was added with cycle 3 CAPOX-improved. 9. Ileostomy takedown 12/07/2012. 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 11. Port-A-Cath placement 12/31/204 12. Severe neutropenia secondary to chemotherapy following cycle 1 of FOLFIRI, chemotherapy was dose reduced and he received Neulasta with day 15 cycle 1  13. Nausea and vomiting following cycle 1 of FOLFIRI 14. Rectal stricture 15. Leukopenia/Thrombocytopenia-persistent, potentially a sequelae of chemotherapy or hepatic toxicity from chemotherapy/radiation. Bone marrow biopsy 11/20/2014 showed cellular bone marrow with trilineage hematopoiesis. Significant dyspoiesis was not present and there was no evidence of metastatic carcinoma. Cytogenetic analysis showed the presence of normal male chromosomes with no observable clonal chromosomal abnormalities.  probable cirrhosis 16. Genetic testing-negative genetic panel in March 2014 17. 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 Severe over the face-steroid Dosepak prescribed 07/03/2015. Improved 07/10/2015. Further improved 07/24/2015, 08/07/2015, 08/28/2015 18. Diarrhea secondary to chemotherapy-encouraged to use Imodium 07/03/2015 19. Paronychia secondary to [facility]    Disposition: [name] appears stable. He has completed 8 cycles of FOLFIRI. He last received PANITUMUMAB 2 weeks ago. He declines PANITUMUMAB today.  For the PANITUMUMAB skin rash I recommended that he resume minocycline.  For the hip pain following Neulasta he will try Claritin  daily for 5-7 days.  He will return for a follow-up visit in 2 weeks. He will contact the office in the  interim with any problems.  Plan reviewed with Dr. Benay Spice.    Ned Card ANP/GNP-BC   10/09/2015  10:58 AM

## 2015-10-10 ENCOUNTER — Other Ambulatory Visit: Payer: Self-pay | Admitting: Medical Oncology

## 2015-10-10 LAB — CEA: CEA1: 1.6 ng/mL (ref 0.0–4.7)

## 2015-10-11 ENCOUNTER — Ambulatory Visit: Payer: BC Managed Care – PPO

## 2015-10-11 ENCOUNTER — Telehealth: Payer: Self-pay | Admitting: Oncology

## 2015-10-11 ENCOUNTER — Ambulatory Visit (HOSPITAL_BASED_OUTPATIENT_CLINIC_OR_DEPARTMENT_OTHER): Payer: BC Managed Care – PPO

## 2015-10-11 VITALS — BP 113/75 | HR 82 | Temp 98.4°F | Resp 18

## 2015-10-11 DIAGNOSIS — C20 Malignant neoplasm of rectum: Secondary | ICD-10-CM | POA: Diagnosis not present

## 2015-10-11 DIAGNOSIS — C787 Secondary malignant neoplasm of liver and intrahepatic bile duct: Principal | ICD-10-CM

## 2015-10-11 MED ORDER — SODIUM CHLORIDE 0.9 % IJ SOLN
10.0000 mL | INTRAMUSCULAR | Status: DC | PRN
  Administered 2015-10-11: 10 mL
  Filled 2015-10-11: qty 10

## 2015-10-11 MED ORDER — PEGFILGRASTIM INJECTION 6 MG/0.6ML ~~LOC~~
6.0000 mg | PREFILLED_SYRINGE | Freq: Once | SUBCUTANEOUS | Status: AC
  Administered 2015-10-11: 6 mg via SUBCUTANEOUS

## 2015-10-11 MED ORDER — HEPARIN SOD (PORK) LOCK FLUSH 100 UNIT/ML IV SOLN
500.0000 [IU] | Freq: Once | INTRAVENOUS | Status: AC | PRN
  Administered 2015-10-11: 500 [IU]
  Filled 2015-10-11: qty 5

## 2015-10-11 NOTE — Telephone Encounter (Signed)
Added injection appointments for 7/22 and 8/5 per 7/20 pof. Start times have not changed. Patient already given injection in infusion area today (7/22).

## 2015-10-20 ENCOUNTER — Other Ambulatory Visit: Payer: Self-pay | Admitting: Oncology

## 2015-10-23 ENCOUNTER — Ambulatory Visit (HOSPITAL_BASED_OUTPATIENT_CLINIC_OR_DEPARTMENT_OTHER): Payer: BC Managed Care – PPO | Admitting: Oncology

## 2015-10-23 ENCOUNTER — Telehealth: Payer: Self-pay | Admitting: Oncology

## 2015-10-23 ENCOUNTER — Telehealth: Payer: Self-pay | Admitting: *Deleted

## 2015-10-23 ENCOUNTER — Encounter: Payer: Self-pay | Admitting: *Deleted

## 2015-10-23 ENCOUNTER — Ambulatory Visit (HOSPITAL_BASED_OUTPATIENT_CLINIC_OR_DEPARTMENT_OTHER): Payer: BC Managed Care – PPO

## 2015-10-23 ENCOUNTER — Other Ambulatory Visit (HOSPITAL_BASED_OUTPATIENT_CLINIC_OR_DEPARTMENT_OTHER): Payer: BC Managed Care – PPO

## 2015-10-23 VITALS — BP 121/74 | HR 78 | Temp 98.2°F | Resp 18 | Ht 70.0 in | Wt 159.8 lb

## 2015-10-23 DIAGNOSIS — C20 Malignant neoplasm of rectum: Secondary | ICD-10-CM | POA: Diagnosis not present

## 2015-10-23 DIAGNOSIS — Z5111 Encounter for antineoplastic chemotherapy: Secondary | ICD-10-CM

## 2015-10-23 DIAGNOSIS — Z5112 Encounter for antineoplastic immunotherapy: Secondary | ICD-10-CM | POA: Diagnosis not present

## 2015-10-23 DIAGNOSIS — C787 Secondary malignant neoplasm of liver and intrahepatic bile duct: Secondary | ICD-10-CM

## 2015-10-23 DIAGNOSIS — R52 Pain, unspecified: Secondary | ICD-10-CM

## 2015-10-23 DIAGNOSIS — R21 Rash and other nonspecific skin eruption: Secondary | ICD-10-CM

## 2015-10-23 DIAGNOSIS — Z95828 Presence of other vascular implants and grafts: Secondary | ICD-10-CM

## 2015-10-23 LAB — COMPREHENSIVE METABOLIC PANEL
ALBUMIN: 3.1 g/dL — AB (ref 3.5–5.0)
ALK PHOS: 79 U/L (ref 40–150)
ALT: 23 U/L (ref 0–55)
ANION GAP: 10 meq/L (ref 3–11)
AST: 13 U/L (ref 5–34)
BUN: 8.7 mg/dL (ref 7.0–26.0)
CO2: 24 mEq/L (ref 22–29)
Calcium: 8.9 mg/dL (ref 8.4–10.4)
Chloride: 110 mEq/L — ABNORMAL HIGH (ref 98–109)
Creatinine: 0.9 mg/dL (ref 0.7–1.3)
Glucose: 103 mg/dl (ref 70–140)
Potassium: 3.3 mEq/L — ABNORMAL LOW (ref 3.5–5.1)
Sodium: 144 mEq/L (ref 136–145)
TOTAL PROTEIN: 6.2 g/dL — AB (ref 6.4–8.3)

## 2015-10-23 LAB — CBC WITH DIFFERENTIAL/PLATELET
BASO%: 0.5 % (ref 0.0–2.0)
BASOS ABS: 0 10*3/uL (ref 0.0–0.1)
EOS ABS: 0.1 10*3/uL (ref 0.0–0.5)
EOS%: 1.4 % (ref 0.0–7.0)
HCT: 33.1 % — ABNORMAL LOW (ref 38.4–49.9)
HGB: 10.4 g/dL — ABNORMAL LOW (ref 13.0–17.1)
LYMPH%: 10 % — AB (ref 14.0–49.0)
MCH: 28 pg (ref 27.2–33.4)
MCHC: 31.4 g/dL — ABNORMAL LOW (ref 32.0–36.0)
MCV: 89 fL (ref 79.3–98.0)
MONO#: 0.6 10*3/uL (ref 0.1–0.9)
MONO%: 12.7 % (ref 0.0–14.0)
NEUT%: 75.4 % — ABNORMAL HIGH (ref 39.0–75.0)
NEUTROS ABS: 3.3 10*3/uL (ref 1.5–6.5)
PLATELETS: 111 10*3/uL — AB (ref 140–400)
RBC: 3.72 10*6/uL — AB (ref 4.20–5.82)
RDW: 16.9 % — ABNORMAL HIGH (ref 11.0–14.6)
WBC: 4.3 10*3/uL (ref 4.0–10.3)
lymph#: 0.4 10*3/uL — ABNORMAL LOW (ref 0.9–3.3)

## 2015-10-23 LAB — CEA (IN HOUSE-CHCC): CEA (CHCC-In House): 1.4 ng/mL (ref 0.00–5.00)

## 2015-10-23 MED ORDER — PALONOSETRON HCL INJECTION 0.25 MG/5ML
INTRAVENOUS | Status: AC
  Filled 2015-10-23: qty 5

## 2015-10-23 MED ORDER — PALONOSETRON HCL INJECTION 0.25 MG/5ML
0.2500 mg | Freq: Once | INTRAVENOUS | Status: AC
  Administered 2015-10-23: 0.25 mg via INTRAVENOUS

## 2015-10-23 MED ORDER — SODIUM CHLORIDE 0.9 % IV SOLN
Freq: Once | INTRAVENOUS | Status: AC
  Administered 2015-10-23: 10:00:00 via INTRAVENOUS

## 2015-10-23 MED ORDER — IRINOTECAN HCL CHEMO INJECTION 100 MG/5ML
135.0000 mg/m2 | Freq: Once | INTRAVENOUS | Status: AC
  Administered 2015-10-23: 260 mg via INTRAVENOUS
  Filled 2015-10-23: qty 9

## 2015-10-23 MED ORDER — SODIUM CHLORIDE 0.9 % IJ SOLN
10.0000 mL | INTRAMUSCULAR | Status: DC | PRN
Start: 2015-10-23 — End: 2015-10-23
  Administered 2015-10-23: 10 mL via INTRAVENOUS
  Filled 2015-10-23: qty 10

## 2015-10-23 MED ORDER — FLUOROURACIL CHEMO INJECTION 5 GM/100ML
2400.0000 mg/m2 | INTRAVENOUS | Status: DC
  Administered 2015-10-23: 4600 mg via INTRAVENOUS
  Filled 2015-10-23: qty 92

## 2015-10-23 MED ORDER — ATROPINE SULFATE 1 MG/ML IJ SOLN: 0.5000 mg | Freq: Once | INTRAMUSCULAR | Status: DC | PRN

## 2015-10-23 MED ORDER — LEUCOVORIN CALCIUM INJECTION 350 MG
400.0000 mg/m2 | Freq: Once | INTRAVENOUS | Status: AC
  Administered 2015-10-23: 764 mg via INTRAVENOUS
  Filled 2015-10-23: qty 38.2

## 2015-10-23 MED ORDER — FLUOROURACIL CHEMO INJECTION 2.5 GM/50ML
400.0000 mg/m2 | Freq: Once | INTRAVENOUS | Status: AC
  Administered 2015-10-23: 750 mg via INTRAVENOUS
  Filled 2015-10-23: qty 15

## 2015-10-23 MED ORDER — SODIUM CHLORIDE 0.9 % IV SOLN
3.0000 mg/kg | Freq: Once | INTRAVENOUS | Status: AC
  Administered 2015-10-23: 220 mg via INTRAVENOUS
  Filled 2015-10-23: qty 11

## 2015-10-23 MED ORDER — SODIUM CHLORIDE 0.9 % IV SOLN
Freq: Once | INTRAVENOUS | Status: AC
  Administered 2015-10-23: 10:00:00 via INTRAVENOUS
  Filled 2015-10-23: qty 5

## 2015-10-23 NOTE — Progress Notes (Signed)
Oncology Nurse Navigator Documentation  Oncology Nurse Navigator Flowsheets 10/23/2015  Navigator Location [facility]  Navigator Encounter Type Treatment  Telephone -  Patient Visit Type MedOnc  Treatment Phase Active Tx--FOLFIRI/Panitumumab   Barriers/Navigation Needs Coordination of Care--CT scan (requests a Friday in am if possible). Gave him his contrast.  Education -  Interventions Coordination of Care;Other--made him aware that navigator is just a call away if he wants to vent or talk-expresses that he is getting tired of the treatments. 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 MD will start Tramadol.  Referrals -  Coordination of Care Radiology--sent message to managed care to work on precert  Education Method -  Support Groups/Services -  Acuity Level 2  Time Spent with Patient 30

## 2015-10-23 NOTE — Progress Notes (Signed)
[place] OFFICE PROGRESS NOTE   Diagnosis: Rectal cancer  INTERVAL HISTORY:   [name] returns as scheduled. He completed another cycle of FOLFIRI on 10/09/2015. He did not receive panitumumab with the last cycle of chemotherapy. He reports diarrhea and increased flatulence following chemotherapy. He had pain in the hip/by Dr. and back following Neulasta. The skin rash remains improved.  Objective:  Vital signs in last 24 hours:  Blood pressure 121/74, pulse 78, temperature 98.2 F (36.8 C), temperature source Oral, resp. rate 18, height '5\' 10"'  (1.778 m), weight 159 lb 12.8 oz (72.5 kg), SpO2 100 %.    HEENT: No thrush or ulcers Resp: Lungs clear bilaterally Cardio: Regular rate and rhythm GI: No hepatosplenomegaly, nontender Vascular: No leg edema  Skin: Mild acne type rash over the trunk, mild paronychia   Portacath/PICC-without erythema  Lab Results:  Lab Results  Component Value Date   WBC 4.3 10/23/2015   HGB 10.4 (L) 10/23/2015   HCT 33.1 (L) 10/23/2015   MCV 89.0 10/23/2015   PLT 111 (L) 10/23/2015   NEUTROABS 3.3 10/23/2015     Lab Results  Component Value Date   CEA1 1.6 10/09/2015    Imaging:  No results found.  Medications: I have reviewed the patient's current medications.  Assessment/Plan: 1. Rectal cancer. Partially obstructing mass noted 1-2 cm from the anal verge on a colonoscopy 03/06/2012. Endoscopic ultrasound 03/14/2012 with a 7.5 mm thick, 3.2 cm wide hypoechoic, irregularly bordered mass that clearly passed into and through the muscularis propria layer of the distal rectal wall (uT3); 3 small (largest 7 mm) perirectal lymph nodes. The lymph nodes were all round, discrete, hypoechoic, homogenous; suspicious for malignant involvement (uN1).  No RAS mutation identified by [facility] 1 testing on the colon resection specimen 06/29/2012, APC alteration identified, microsatellite stable  He began radiation and concurrent  Xeloda chemotherapy on 03/20/2012, completed 04/27/2012.   Low anterior resection/coloanal anastomosis and diverting ileostomy 06/29/2012 with the final pathology revealing a T2N0 tumor with extensive fibrosis and negative margins.   Cycle 1 of adjuvant CAPOX 07/19/2012. Cycle 5 of adjuvant CAPOX 10/11/2012.   CEA 2.5 on 11/14/2012.   CEA 14.6 03/08/2013.   Restaging CT evaluation 03/08/2013 with a 4 mm pulmonary nodule left lung base not identified on comparison exam; new liver lesions including a 29 x 26 mm irregular peripheral enhancing rounded lesion in the dome of the right hepatic lobe, a less well-defined new subcapsular lesion in the lateral right hepatic lobe measuring 12 mm, a new subcapsular lesion in the anterior right hepatic lobe adjacent to the gallbladder fossa measuring 10 mm; a rounded low-density lesion in the inferior right hepatic lobe measuring 10 mm compared to 7 mm on the prior study (radiologist commented this may represent an enlarged cyst).   MRI of the abdomen 04/12/2013 confirmed multiple T2 hyperintense metastatic lesions throughout the right liver   Initiation of FOLFIRI/Avastin with genotype based irinotecan dosing per the [facility] study 04/05/2013.   Restaging CT evaluation on 06/20/2013 (after 2 cycles/4 treatments) showed improvement in the liver metastases and stable size of a left lower lobe pulmonary nodule now with central cavitation.   Continuation of FOLFIRI/Avastin.   Restaging CT evaluation 08/14/2013-decrease in the size of liver metastases, slight decrease in the size of a cavitary left lower lobe nodule, no evidence of disease progression.   Status post right hepatic lobectomy 09/17/2013. Pathology showed multiple foci of metastatic adenocarcinoma (5 nodules of metastatic adenocarcinoma 4 of which are  subcapsular with the nodules ranging in size from 0.6-1.8 cm in greatest dimension). Margins not involved. Biopsy of a portal lymph node showed  benign adipose tissue; no lymph node tissue or malignancy.   Normal CEA 11/06/2013   CT 11/06/2013 with a new pleural-based right lower chest lesion, slight in enlargement of the a left-sided lung nodule  CT 01/29/2014 with a decrease in the right lower chest pleural-based lesion and a slight increase of a left-sided lung lesion, other lung lesions were stable  CT chest 05/02/2014 with a decrease in the size of a right lower lobe pulmonary nodule months similar size of a dominant left lower lobe nodule, minimal enlargement of a smaller left lower lobe nodule, no new site of disease  CT chest 11/07/2014 with a slight increase in several left-sided lung nodules  CT chest 02/10/2015 with new small left hilar lymph nodes, a possible new left lower lobe nodule, and stability of other lung nodules  PET scan 03/12/2015 with hypermetabolic left hilar nodes, hypermetabolic left lower lobe nodules, hyper metabolic retroperitoneal nodes, intense hypermetabolism at the coloanal anastomosis, and hypermetabolic thickening in the presacral space  Cycle 1 FOLFIRI/PANITUMUMAB 05/21/2015  Cycle 2 FOLFIRI/PANITUMUMAB 06/05/2015  Cycle 3 FOLFIRI/PANITUMUMAB 06/19/2015  Cycle 4 FOLFIRI 07/10/2015  Cycle 5 FOLFIRI 07/24/2015  Restaging CTs 08/06/2015-resolution of hilar/retroperitoneal adenopathy, improvement in the hypermetabolic lung nodule, other lung nodules are stable, no new lesions  Cycle 6 FOLFIRI with PANITUMUMAB 08/28/2015 -panitumumab dose reduced  Cycle 7 FOLFIRI 09/11/2015-no panitumumab given  Cycle 8 FOLFIRI with PANITUMUMAB 09/25/2015-PANITUMUMAB dose reduced  Cycle 9 FOLFIRI 10/09/2015-no PANITUMUMAB given  Cycle 10 FOLFIRI with panitumumab 10/23/2015 2. Irregular bowel habits/rectal bleeding secondary to #1. 3. History of Mild elevation of the liver enzymes. Question secondary to hepatic steatosis. 4. Indeterminate 8 mm posterior right liver lesion on the staging CT  03/06/2012. 5. Mildly elevated CEA at 5.7 on 03/06/2012. 6. History of radiation erythema at the groin and perineum 7. 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 No clinical evidence of thrombophlebitis or extravasation. 8. Delayed nausea following chemotherapy-Decadron prophylaxis was added with cycle 3 CAPOX-improved. 9. Ileostomy takedown 12/07/2012. 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 11. Port-A-Cath placement 12/31/204 12. Severe neutropenia secondary to chemotherapy following cycle 1 of FOLFIRI, chemotherapy was dose reduced and he received Neulasta with day 15 cycle 1  13. Nausea and vomiting following cycle 1 of FOLFIRI 14. Rectal stricture 15. Leukopenia/Thrombocytopenia-persistent, potentially a sequelae of chemotherapy or hepatic toxicity from chemotherapy/radiation. Bone marrow biopsy 11/20/2014 showed cellular bone marrow with trilineage hematopoiesis. Significant dyspoiesis was not present and there was no evidence of metastatic carcinoma. Cytogenetic analysis showed the presence of normal male chromosomes with no observable clonal chromosomal abnormalities.  probable cirrhosis 16. Genetic testing-negative genetic panel in March 2014 17. 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 Severe over the face-steroid Dosepak prescribed 07/03/2015. Improved 07/10/2015. Further improved 07/24/2015, 08/07/2015, 08/28/2015 18. Diarrhea secondary to chemotherapy-encouraged to use Imodium 07/03/2015 19. Paronychia secondary to [facility]    Disposition:  Jacob [name] appears well. He will complete a cycle of FOLFIRI/panitumumab today. He will undergo a restaging CT evaluation after this cycle.  We will prescribe tramadol to use as needed for pain associated with Neulasta.  Betsy Coder, MD  10/23/2015  5:23 PM

## 2015-10-23 NOTE — Telephone Encounter (Signed)
per pof to sch pt appt-sch-cld Tonya and asked if next trmt to b scheduled-per tonya no sch whats on pof for now-pt to get contrast b4 leaving trmt room

## 2015-10-23 NOTE — Patient Instructions (Signed)
[facility] Discharge Instructions for Patients Receiving Chemotherapy  Today you received the following chemotherapy agents Vectibix/5 FU/Leucovorin/Irinotecan  To help prevent nausea and vomiting after your treatment, we encourage you to take your nausea medication as prescribed.   If you develop nausea and vomiting that is not controlled by your nausea medication, call the clinic.   BELOW ARE SYMPTOMS THAT SHOULD BE REPORTED IMMEDIATELY:  *FEVER GREATER THAN 100.5 F  *CHILLS WITH OR WITHOUT FEVER  NAUSEA AND VOMITING THAT IS NOT CONTROLLED WITH YOUR NAUSEA MEDICATION  *UNUSUAL SHORTNESS OF BREATH  *UNUSUAL BRUISING OR BLEEDING  TENDERNESS IN MOUTH AND THROAT WITH OR WITHOUT PRESENCE OF ULCERS  *URINARY PROBLEMS  *BOWEL PROBLEMS  UNUSUAL RASH Items with * indicate a potential emergency and should be followed up as soon as possible.  Feel free to call the clinic you have any questions or concerns. The clinic phone number is [phone].  Please show the [place] at check-in to the Emergency Department and triage nurse.

## 2015-10-23 NOTE — Patient Instructions (Signed)

## 2015-10-23 NOTE — Telephone Encounter (Signed)
Pt will get updated sched in tx

## 2015-10-23 NOTE — Telephone Encounter (Signed)
Call from [place] in managed care at [facility]: CT is approved. Notified radiology scheduling.

## 2015-10-24 ENCOUNTER — Other Ambulatory Visit: Payer: Self-pay | Admitting: *Deleted

## 2015-10-24 LAB — CEA: CEA: 1.5 ng/mL (ref 0.0–4.7)

## 2015-10-24 MED ORDER — TRAMADOL HCL 50 MG PO TABS: 50.0000 mg | ORAL_TABLET | Freq: Four times a day (QID) | ORAL | 0 refills | Status: DC | PRN

## 2015-10-25 ENCOUNTER — Ambulatory Visit (HOSPITAL_BASED_OUTPATIENT_CLINIC_OR_DEPARTMENT_OTHER): Payer: BC Managed Care – PPO

## 2015-10-25 VITALS — BP 123/79 | HR 108 | Temp 97.6°F | Resp 18

## 2015-10-25 DIAGNOSIS — C20 Malignant neoplasm of rectum: Secondary | ICD-10-CM

## 2015-10-25 MED ORDER — SODIUM CHLORIDE 0.9 % IJ SOLN
10.0000 mL | INTRAMUSCULAR | Status: DC | PRN
  Administered 2015-10-25: 10 mL
  Filled 2015-10-25: qty 10

## 2015-10-25 MED ORDER — HEPARIN SOD (PORK) LOCK FLUSH 100 UNIT/ML IV SOLN
500.0000 [IU] | Freq: Once | INTRAVENOUS | Status: AC | PRN
  Administered 2015-10-25: 500 [IU]
  Filled 2015-10-25: qty 5

## 2015-10-25 MED ORDER — PEGFILGRASTIM INJECTION 6 MG/0.6ML ~~LOC~~
6.0000 mg | PREFILLED_SYRINGE | Freq: Once | SUBCUTANEOUS | Status: AC
  Administered 2015-10-25: 6 mg via SUBCUTANEOUS

## 2015-10-27 ENCOUNTER — Other Ambulatory Visit: Payer: Self-pay | Admitting: *Deleted

## 2015-10-27 ENCOUNTER — Telehealth: Payer: Self-pay | Admitting: *Deleted

## 2015-10-27 NOTE — Telephone Encounter (Signed)
Oncology Nurse Navigator Documentation  Oncology Nurse Navigator Flowsheets 10/27/2015  Navigator Location [facility]  Navigator Encounter Type Telephone  Telephone Outgoing Call;Appt Confirmation/Clarification  Patient Visit Type -  Treatment Phase -  Barriers/Navigation Needs Coordination of Care--CT scan  Interventions Coordination of Care--scheduled scan and left VM-CT 8/18 at 0945/1000 at [facility]. NPO after midnight except contrast at 0800 and 0900  Referrals -  Coordination of Care Radiology--requested he return call to confirm.  Education Method -  Support Groups/Services -  Acuity -  Time Spent with Patient 15

## 2015-10-27 NOTE — Telephone Encounter (Signed)
Lloyd called back to confirm the CT appointment was agreeable.

## 2015-11-02 ENCOUNTER — Other Ambulatory Visit: Payer: Self-pay | Admitting: Oncology

## 2015-11-05 ENCOUNTER — Telehealth: Payer: Self-pay | Admitting: Oncology

## 2015-11-05 NOTE — Telephone Encounter (Signed)
PATIENT CALLED TO RESCHEDULE APPOINTMENT. WAS TRYING TO FIT PT IN FOR 12/12/15. PT STATED THAT HE ADVISED DR SHERRILL THAT HE WILL NOT BE DOING TREATMENTS AND THAT HE DOES NOT KNOW WHY HE WAS SCHD FOR A F/U. PT REQUESTED F/U APPT BE SCHD IN SEVERAL WEEKS. ADVISED PT THAT I WILL NEED TO TRANSFER HIM TO THE DESK NURSE SO THAT HE MAY DISCUSS IF ITS OK FOR HIS F/U APPT TO BE [facility] SEVERAL WEEKS FROM HIS CT SCAN. PT DISCONNECTED CALL. TRIED CALLING PT BACK TO OFFER 09/22.

## 2015-11-06 ENCOUNTER — Telehealth: Payer: Self-pay | Admitting: *Deleted

## 2015-11-06 NOTE — Telephone Encounter (Signed)
Left message on voicemail informing pt appt will be moved to the following day, 8/22 @ 2:30. Order to schedulers for appt.

## 2015-11-06 NOTE — Telephone Encounter (Signed)
Return call received by patient reporting he "has to go out of town to help care for his mother on 11-10-2015 is why needs to reschedule.  Can come in any other day or time.  Can work around my work schedule.  Return number [phone].  Sometimes poor reception at work but will call back if call missed."  Message left for collaborative nurse to obtain provider approval to work patient in.

## 2015-11-06 NOTE — Telephone Encounter (Signed)
Received message from scheduler, pt requests to cancel 8/21 appt. Left message for him to let me know when he can come in for F/U.

## 2015-11-07 ENCOUNTER — Ambulatory Visit (HOSPITAL_COMMUNITY)
Admission: RE | Admit: 2015-11-07 | Discharge: 2015-11-07 | Disposition: A | Discharge status: Home / Self Care | Payer: BC Managed Care – PPO | Source: Ambulatory Visit | Attending: Oncology | Admitting: Oncology

## 2015-11-07 ENCOUNTER — Encounter (HOSPITAL_COMMUNITY): Payer: Self-pay

## 2015-11-07 DIAGNOSIS — M879 Osteonecrosis, unspecified: Secondary | ICD-10-CM | POA: Insufficient documentation

## 2015-11-07 DIAGNOSIS — R918 Other nonspecific abnormal finding of lung field: Secondary | ICD-10-CM | POA: Insufficient documentation

## 2015-11-07 DIAGNOSIS — R161 Splenomegaly, not elsewhere classified: Secondary | ICD-10-CM | POA: Diagnosis not present

## 2015-11-07 DIAGNOSIS — R188 Other ascites: Secondary | ICD-10-CM | POA: Diagnosis not present

## 2015-11-07 DIAGNOSIS — Z95828 Presence of other vascular implants and grafts: Secondary | ICD-10-CM | POA: Diagnosis not present

## 2015-11-07 DIAGNOSIS — C20 Malignant neoplasm of rectum: Secondary | ICD-10-CM | POA: Diagnosis present

## 2015-11-07 MED ORDER — IOPAMIDOL (ISOVUE-300) INJECTION 61%
100.0000 mL | Freq: Once | INTRAVENOUS | Status: AC | PRN
  Administered 2015-11-07: 100 mL via INTRAVENOUS

## 2015-11-07 MED ORDER — IOPAMIDOL (ISOVUE-M 300) INJECTION 61%: 15.0000 mL | Freq: Once | INTRAMUSCULAR | Status: DC | PRN

## 2015-11-10 ENCOUNTER — Ambulatory Visit: Payer: BC Managed Care – PPO | Admitting: Oncology

## 2015-11-11 ENCOUNTER — Ambulatory Visit (HOSPITAL_BASED_OUTPATIENT_CLINIC_OR_DEPARTMENT_OTHER): Payer: BC Managed Care – PPO | Admitting: Oncology

## 2015-11-11 VITALS — BP 127/79 | HR 70 | Temp 97.9°F | Resp 18 | Ht 70.0 in | Wt 161.5 lb

## 2015-11-11 DIAGNOSIS — K5900 Constipation, unspecified: Secondary | ICD-10-CM | POA: Diagnosis not present

## 2015-11-11 DIAGNOSIS — C787 Secondary malignant neoplasm of liver and intrahepatic bile duct: Secondary | ICD-10-CM | POA: Diagnosis not present

## 2015-11-11 DIAGNOSIS — R21 Rash and other nonspecific skin eruption: Secondary | ICD-10-CM

## 2015-11-11 DIAGNOSIS — C20 Malignant neoplasm of rectum: Secondary | ICD-10-CM | POA: Diagnosis not present

## 2015-11-11 NOTE — Progress Notes (Signed)
[place] OFFICE PROGRESS NOTE   Diagnosis: Rectal cancer  INTERVAL HISTORY:   Mr. Solum returns as scheduled. He completed another cycle of FOLFIRI and panitumumab on 10/23/2015. He reports the skin rash is mild. He continues to have cracking of the nailbeds. He reports constipation. He has returned to work.  Objective:  Vital signs in last 24 hours:  Blood pressure 127/79, pulse 70, temperature 97.9 F (36.6 C), temperature source Oral, resp. rate 18, height 5' 10" (1.778 m), weight 161 lb 8 oz (73.3 kg), SpO2 100 %.    HEENT: No thrush or ulcers Lymphatics: No cervical, supraclavicular, axillary, or inguinal nodes Resp: Lungs with scattered end inspiratory rales at the posterior chest bilaterally, no respiratory distress Cardio: Regular rate and rhythm GI: No hepatosplenomegaly, nontender Vascular: No leg edema  Skin: Mild erythematous acne type rash over the chest, mild paronychia   Portacath/PICC-without erythema  Lab Results:  Lab Results  Component Value Date   WBC 4.3 10/23/2015   HGB 10.4 (L) 10/23/2015   HCT 33.1 (L) 10/23/2015   MCV 89.0 10/23/2015   PLT 111 (L) 10/23/2015   NEUTROABS 3.3 10/23/2015     Lab Results  Component Value Date   CEA1 1.5 10/23/2015    Imaging: CT images from 11/07/2015 reviewed with Mr. Rands and his wife  Medications: I have reviewed the patient's current medications.  Assessment/Plan: 1. Rectal cancer. Partially obstructing mass noted 1-2 cm from the anal verge on a colonoscopy 03/06/2012. Endoscopic ultrasound 03/14/2012 with a 7.5 mm thick, 3.2 cm wide hypoechoic, irregularly bordered mass that clearly passed into and through the muscularis propria layer of the distal rectal wall (uT3); 3 small (largest 7 mm) perirectal lymph nodes. The lymph nodes were all round, discrete, hypoechoic, homogenous; suspicious for malignant involvement (uN1).  No RAS mutation identified by [facility] 1 testing on the colon  resection specimen 06/29/2012, APC alteration identified, microsatellite stable  He began radiation and concurrent Xeloda chemotherapy on 03/20/2012, completed 04/27/2012.   Low anterior resection/coloanal anastomosis and diverting ileostomy 06/29/2012 with the final pathology revealing a T2N0 tumor with extensive fibrosis and negative margins.   Cycle 1 of adjuvant CAPOX 07/19/2012. Cycle 5 of adjuvant CAPOX 10/11/2012.   CEA 2.5 on 11/14/2012.   CEA 14.6 03/08/2013.   Restaging CT evaluation 03/08/2013 with a 4 mm pulmonary nodule left lung base not identified on comparison exam; new liver lesions including a 29 x 26 mm irregular peripheral enhancing rounded lesion in the dome of the right hepatic lobe, a less well-defined new subcapsular lesion in the lateral right hepatic lobe measuring 12 mm, a new subcapsular lesion in the anterior right hepatic lobe adjacent to the gallbladder fossa measuring 10 mm; a rounded low-density lesion in the inferior right hepatic lobe measuring 10 mm compared to 7 mm on the prior study (radiologist commented this may represent an enlarged cyst).   MRI of the abdomen 04/12/2013 confirmed multiple T2 hyperintense metastatic lesions throughout the right liver   Initiation of FOLFIRI/Avastin with genotype based irinotecan dosing per the [facility] study 04/05/2013.   Restaging CT evaluation on 06/20/2013 (after 2 cycles/4 treatments) showed improvement in the liver metastases and stable size of a left lower lobe pulmonary nodule now with central cavitation.   Continuation of FOLFIRI/Avastin.   Restaging CT evaluation 08/14/2013-decrease in the size of liver metastases, slight decrease in the size of a cavitary left lower lobe nodule, no evidence of disease progression.   Status post right hepatic lobectomy  09/17/2013. Pathology showed multiple foci of metastatic adenocarcinoma (5 nodules of metastatic adenocarcinoma 4 of which are subcapsular with the  nodules ranging in size from 0.6-1.8 cm in greatest dimension). Margins not involved. Biopsy of a portal lymph node showed benign adipose tissue; no lymph node tissue or malignancy.   Normal CEA 11/06/2013   CT 11/06/2013 with a new pleural-based right lower chest lesion, slight in enlargement of the a left-sided lung nodule  CT 01/29/2014 with a decrease in the right lower chest pleural-based lesion and a slight increase of a left-sided lung lesion, other lung lesions were stable  CT chest 05/02/2014 with a decrease in the size of a right lower lobe pulmonary nodule months similar size of a dominant left lower lobe nodule, minimal enlargement of a smaller left lower lobe nodule, no new site of disease  CT chest 11/07/2014 with a slight increase in several left-sided lung nodules  CT chest 02/10/2015 with new small left hilar lymph nodes, a possible new left lower lobe nodule, and stability of other lung nodules  PET scan 03/12/2015 with hypermetabolic left hilar nodes, hypermetabolic left lower lobe nodules, hyper metabolic retroperitoneal nodes, intense hypermetabolism at the coloanal anastomosis, and hypermetabolic thickening in the presacral space  Cycle 1 FOLFIRI/PANITUMUMAB 05/21/2015  Cycle 2 FOLFIRI/PANITUMUMAB 06/05/2015  Cycle 3 FOLFIRI/PANITUMUMAB 06/19/2015  Cycle 4 FOLFIRI 07/10/2015  Cycle 5 FOLFIRI 07/24/2015  Restaging CTs 08/06/2015-resolution of hilar/retroperitoneal adenopathy, improvement in the hypermetabolic lung nodule, other lung nodules are stable, no new lesions  Cycle 6 FOLFIRI with PANITUMUMAB 08/28/2015 -panitumumab dose reduced  Cycle 7 FOLFIRI 09/11/2015-no panitumumab given  Cycle 8 FOLFIRI with PANITUMUMAB 09/25/2015-PANITUMUMAB dose reduced  Cycle 9 FOLFIRI 10/09/2015-no PANITUMUMAB given  Cycle 10 FOLFIRI with panitumumab 10/23/2015  CTs 11/06/2015-possible slight enlargement of a left lower lobe nodule, no other evidence of disease  progression. 2. Irregular bowel habits/rectal bleeding secondary to #1. 3. History of Mild elevation of the liver enzymes. Question secondary to hepatic steatosis. 4. Indeterminate 8 mm posterior right liver lesion on the staging CT 03/06/2012. 5. Mildly elevated CEA at 5.7 on 03/06/2012. 6. History of radiation erythema at the groin and perineum 7. 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 No clinical evidence of thrombophlebitis or extravasation. 8. Delayed nausea following chemotherapy-Decadron prophylaxis was added with cycle 3 CAPOX-improved. 9. Ileostomy takedown 12/07/2012. 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 11. Port-A-Cath placement 12/31/204 12. Severe neutropenia secondary to chemotherapy following cycle 1 of FOLFIRI, chemotherapy was dose reduced and he received Neulasta with day 15 cycle 1  13. Nausea and vomiting following cycle 1 of FOLFIRI 14. Rectal stricture 15. Leukopenia/Thrombocytopenia-persistent, potentially a sequelae of chemotherapy or hepatic toxicity from chemotherapy/radiation. Bone marrow biopsy 11/20/2014 showed cellular bone marrow with trilineage hematopoiesis. Significant dyspoiesis was not present and there was no evidence of metastatic carcinoma. Cytogenetic analysis showed the presence of normal male chromosomes with no observable clonal chromosomal abnormalities.  probable cirrhosis 16. Genetic testing-negative genetic panel in March 2014 17. 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 Severe over the face-steroid Dosepak prescribed 07/03/2015. Improved 07/10/2015. Further improved 07/24/2015, 08/07/2015, 08/28/2015 18. Diarrhea secondary to chemotherapy-encouraged to use Imodium 07/03/2015 19. Paronychia secondary to [facility]   Disposition:  Mr. Allbaugh appears stable. The CEA is normal and the restaging CT evaluation reveals no clear evidence of disease  progression. I recommend continuing treatment. He would like a treatment break. I recommend "maintenance "5 fluorouracil/panitumumab therapy. I suspect the irregular bowel habits and malaise he has experienced  following chemotherapy is mostly related to irinotecan. He declines further treatment at present.  Mr. Noorani will return for a Port-A-Cath flush and CBC on 12/05/2015. He will return for an office visit and CEA on 01/14/2016.  Betsy Coder, MD  11/11/2015  2:33 PM

## 2015-11-12 ENCOUNTER — Encounter: Payer: Self-pay | Admitting: Genetic Counselor

## 2015-11-20 ENCOUNTER — Telehealth: Payer: Self-pay | Admitting: Nurse Practitioner

## 2015-11-20 NOTE — Telephone Encounter (Signed)
CALLED PATIENT TO CONF APPTS PER 11/11/15 LOS. L/M APPT LTR AND SCHD MAILED. 11/20/15

## 2015-12-05 ENCOUNTER — Other Ambulatory Visit (HOSPITAL_BASED_OUTPATIENT_CLINIC_OR_DEPARTMENT_OTHER): Payer: BC Managed Care – PPO

## 2015-12-05 ENCOUNTER — Ambulatory Visit: Payer: BC Managed Care – PPO

## 2015-12-05 DIAGNOSIS — Z95828 Presence of other vascular implants and grafts: Secondary | ICD-10-CM

## 2015-12-05 DIAGNOSIS — C20 Malignant neoplasm of rectum: Secondary | ICD-10-CM

## 2015-12-05 LAB — CBC WITH DIFFERENTIAL/PLATELET
BASO%: 0.7 % (ref 0.0–2.0)
BASOS ABS: 0 10*3/uL (ref 0.0–0.1)
EOS%: 3.1 % (ref 0.0–7.0)
Eosinophils Absolute: 0.1 10*3/uL (ref 0.0–0.5)
HEMATOCRIT: 35.9 % — AB (ref 38.4–49.9)
HGB: 11.4 g/dL — ABNORMAL LOW (ref 13.0–17.1)
LYMPH#: 0.4 10*3/uL — AB (ref 0.9–3.3)
LYMPH%: 13.2 % — AB (ref 14.0–49.0)
MCH: 28.6 pg (ref 27.2–33.4)
MCHC: 31.9 g/dL — AB (ref 32.0–36.0)
MCV: 89.7 fL (ref 79.3–98.0)
MONO#: 0.3 10*3/uL (ref 0.1–0.9)
MONO%: 9.3 % (ref 0.0–14.0)
NEUT#: 2.3 10*3/uL (ref 1.5–6.5)
NEUT%: 73.7 % (ref 39.0–75.0)
PLATELETS: 105 10*3/uL — AB (ref 140–400)
RBC: 4 10*6/uL — ABNORMAL LOW (ref 4.20–5.82)
RDW: 19.4 % — ABNORMAL HIGH (ref 11.0–14.6)
WBC: 3.1 10*3/uL — ABNORMAL LOW (ref 4.0–10.3)

## 2015-12-05 MED ORDER — HEPARIN SOD (PORK) LOCK FLUSH 100 UNIT/ML IV SOLN
500.0000 [IU] | Freq: Once | INTRAVENOUS | Status: AC | PRN
Start: 2015-12-05 — End: 2015-12-05
  Administered 2015-12-05: 500 [IU] via INTRAVENOUS
  Filled 2015-12-05: qty 5

## 2015-12-05 MED ORDER — SODIUM CHLORIDE 0.9 % IJ SOLN
10.0000 mL | INTRAMUSCULAR | Status: DC | PRN
  Administered 2015-12-05: 10 mL via INTRAVENOUS
  Filled 2015-12-05: qty 10

## 2016-01-14 ENCOUNTER — Encounter: Payer: Self-pay | Admitting: *Deleted

## 2016-01-14 ENCOUNTER — Ambulatory Visit: Payer: BC Managed Care – PPO

## 2016-01-14 ENCOUNTER — Ambulatory Visit (HOSPITAL_BASED_OUTPATIENT_CLINIC_OR_DEPARTMENT_OTHER): Payer: BC Managed Care – PPO | Admitting: Nurse Practitioner

## 2016-01-14 ENCOUNTER — Other Ambulatory Visit (HOSPITAL_BASED_OUTPATIENT_CLINIC_OR_DEPARTMENT_OTHER): Payer: BC Managed Care – PPO

## 2016-01-14 VITALS — BP 117/74 | HR 74 | Temp 98.1°F | Resp 18 | Ht 70.0 in | Wt 152.6 lb

## 2016-01-14 DIAGNOSIS — C787 Secondary malignant neoplasm of liver and intrahepatic bile duct: Secondary | ICD-10-CM | POA: Diagnosis not present

## 2016-01-14 DIAGNOSIS — C20 Malignant neoplasm of rectum: Secondary | ICD-10-CM

## 2016-01-14 DIAGNOSIS — Z95828 Presence of other vascular implants and grafts: Secondary | ICD-10-CM

## 2016-01-14 MED ORDER — SODIUM CHLORIDE 0.9 % IJ SOLN
10.0000 mL | INTRAMUSCULAR | Status: DC | PRN
  Administered 2016-01-14: 10 mL via INTRAVENOUS
  Filled 2016-01-14: qty 10

## 2016-01-14 MED ORDER — HEPARIN SOD (PORK) LOCK FLUSH 100 UNIT/ML IV SOLN
500.0000 [IU] | Freq: Once | INTRAVENOUS | Status: AC | PRN
  Administered 2016-01-14: 500 [IU] via INTRAVENOUS
  Filled 2016-01-14: qty 5

## 2016-01-14 NOTE — Progress Notes (Signed)
Oncology Nurse Navigator Documentation  Oncology Nurse Navigator Flowsheets 01/14/2016  Navigator Location [facility]  Navigator Encounter Type Follow-up Appt  Telephone -  Patient Visit Type MedOnc;Follow-up  Treatment Phase Other--treatment break at patient request  Barriers/Navigation Needs No barriers at this time;No Questions;No Needs  Education -  Interventions None required  Referrals -  Coordination of Care -Will follow up on CEA drawn today  Education Method -  Support Groups/Services -Reports feeling well and spirits are good. Went to [employer] with family and had great time. Face "lights up" when he talks about his daughter.  Acuity Level 1  Time Spent with Patient 15

## 2016-01-14 NOTE — Progress Notes (Signed)
[facility] OFFICE PROGRESS NOTE   Diagnosis:  Rectal cancer  INTERVAL HISTORY:   [name] returns as scheduled. Per his request he is on a treatment break. He overall is feeling well. Bowel habits have improved though still somewhat irregular. He has had recent constipation. He has periodic "gas pain". He reports a good appetite. He has lost some weight since his last visit. He thinks this may be due to a change in his diet. Skin rash is better. Nailbeds are healing. He denies shortness of breath. No cough.  Objective:  Vital signs in last 24 hours:  Blood pressure 117/74, pulse 74, temperature 98.1 F (36.7 C), temperature source Oral, resp. rate 18, height '5\' 10"'  (1.778 m), weight 152 lb 9.6 oz (69.2 kg), SpO2 100 %.    HEENT: No thrush or ulcers. Lymphatics: No palpable cervical or supraclavicular lymph nodes. Resp: Lungs clear bilaterally. Cardio: Regular rate and rhythm. GI: Abdomen soft and nontender. No organomegaly. No mass. Vascular: No leg edema. Port-A-Cath without erythema.   Lab Results:  Lab Results  Component Value Date   WBC 3.1 (L) 12/05/2015   HGB 11.4 (L) 12/05/2015   HCT 35.9 (L) 12/05/2015   MCV 89.7 12/05/2015   PLT 105 (L) 12/05/2015   NEUTROABS 2.3 12/05/2015    Imaging:  No results found.  Medications: I have reviewed the patient's current medications.  Assessment/Plan: 1. Rectal cancer. Partially obstructing mass noted 1-2 cm from the anal verge on a colonoscopy 03/06/2012. Endoscopic ultrasound 03/14/2012 with a 7.5 mm thick, 3.2 cm wide hypoechoic, irregularly bordered mass that clearly passed into and through the muscularis propria layer of the distal rectal wall (uT3); 3 small (largest 7 mm) perirectal lymph nodes. The lymph nodes were all round, discrete, hypoechoic, homogenous; suspicious for malignant involvement (uN1).  No RAS mutation identified by [facility] 1 testing on the colon resection specimen 06/29/2012, APC  alteration identified, microsatellite stable  He began radiation and concurrent Xeloda chemotherapy on 03/20/2012, completed 04/27/2012.   Low anterior resection/coloanal anastomosis and diverting ileostomy 06/29/2012 with the final pathology revealing a T2N0 tumor with extensive fibrosis and negative margins.   Cycle 1 of adjuvant CAPOX 07/19/2012. Cycle 5 of adjuvant CAPOX 10/11/2012.   CEA 2.5 on 11/14/2012.   CEA 14.6 03/08/2013.   Restaging CT evaluation 03/08/2013 with a 4 mm pulmonary nodule left lung base not identified on comparison exam; new liver lesions including a 29 x 26 mm irregular peripheral enhancing rounded lesion in the dome of the right hepatic lobe, a less well-defined new subcapsular lesion in the lateral right hepatic lobe measuring 12 mm, a new subcapsular lesion in the anterior right hepatic lobe adjacent to the gallbladder fossa measuring 10 mm; a rounded low-density lesion in the inferior right hepatic lobe measuring 10 mm compared to 7 mm on the prior study (radiologist commented this may represent an enlarged cyst).   MRI of the abdomen 04/12/2013 confirmed multiple T2 hyperintense metastatic lesions throughout the right liver   Initiation of FOLFIRI/Avastin with genotype based irinotecan dosing per the [facility] study 04/05/2013.   Restaging CT evaluation on 06/20/2013 (after 2 cycles/4 treatments) showed improvement in the liver metastases and stable size of a left lower lobe pulmonary nodule now with central cavitation.   Continuation of FOLFIRI/Avastin.   Restaging CT evaluation 08/14/2013-decrease in the size of liver metastases, slight decrease in the size of a cavitary left lower lobe nodule, no evidence of disease progression.   Status post right hepatic  lobectomy 09/17/2013. Pathology showed multiple foci of metastatic adenocarcinoma (5 nodules of metastatic adenocarcinoma 4 of which are subcapsular with the nodules ranging in size from 0.6-1.8 cm  in greatest dimension). Margins not involved. Biopsy of a portal lymph node showed benign adipose tissue; no lymph node tissue or malignancy.   Normal CEA 11/06/2013   CT 11/06/2013 with a new pleural-based right lower chest lesion, slight in enlargement of the a left-sided lung nodule  CT 01/29/2014 with a decrease in the right lower chest pleural-based lesion and a slight increase of a left-sided lung lesion, other lung lesions were stable  CT chest 05/02/2014 with a decrease in the size of a right lower lobe pulmonary nodule months similar size of a dominant left lower lobe nodule, minimal enlargement of a smaller left lower lobe nodule, no new site of disease  CT chest 11/07/2014 with a slight increase in several left-sided lung nodules  CT chest 02/10/2015 with new small left hilar lymph nodes, a possible new left lower lobe nodule, and stability of other lung nodules  PET scan 03/12/2015 with hypermetabolic left hilar nodes, hypermetabolic left lower lobe nodules, hyper metabolic retroperitoneal nodes, intense hypermetabolism at the coloanal anastomosis, and hypermetabolic thickening in the presacral space  Cycle 1 FOLFIRI/PANITUMUMAB 05/21/2015  Cycle 2 FOLFIRI/PANITUMUMAB 06/05/2015  Cycle 3 FOLFIRI/PANITUMUMAB 06/19/2015  Cycle 4 FOLFIRI 07/10/2015  Cycle 5 FOLFIRI 07/24/2015  Restaging CTs 08/06/2015-resolution of hilar/retroperitoneal adenopathy, improvement in the hypermetabolic lung nodule, other lung nodules are stable, no new lesions  Cycle 6 FOLFIRI with PANITUMUMAB 08/28/2015 -panitumumab dose reduced  Cycle 7 FOLFIRI 09/11/2015-no panitumumab given  Cycle 8 FOLFIRI with PANITUMUMAB 09/25/2015-PANITUMUMAB dose reduced  Cycle 9 FOLFIRI 10/09/2015-no PANITUMUMAB given  Cycle 10 FOLFIRI with panitumumab 10/23/2015  CTs 11/06/2015-possible slight enlargement of a left lower lobe nodule, no other evidence of disease progression. 2. Irregular bowel habits/rectal  bleeding secondary to #1. 3. History of Mild elevation of the liver enzymes. Question secondary to hepatic steatosis. 4. Indeterminate 8 mm posterior right liver lesion on the staging CT 03/06/2012. 5. Mildly elevated CEA at 5.7 on 03/06/2012. 6. History of radiation erythema at the groin and perineum 7. 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 No clinical evidence of thrombophlebitis or extravasation. 8. Delayed nausea following chemotherapy-Decadron prophylaxis was added with cycle 3 CAPOX-improved. 9. Ileostomy takedown 12/07/2012. 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 11. Port-A-Cath placement 12/31/204 12. Severe neutropenia secondary to chemotherapy following cycle 1 of FOLFIRI, chemotherapy was dose reduced and he received Neulasta with day 15 cycle 1  13. Nausea and vomiting following cycle 1 of FOLFIRI 14. Rectal stricture 15. Leukopenia/Thrombocytopenia-persistent, potentially a sequelae of chemotherapy or hepatic toxicity from chemotherapy/radiation. Bone marrow biopsy 11/20/2014 showed cellular bone marrow with trilineage hematopoiesis. Significant dyspoiesis was not present and there was no evidence of metastatic carcinoma. Cytogenetic analysis showed the presence of normal male chromosomes with no observable clonal chromosomal abnormalities.  probable cirrhosis 16. Genetic testing-negative genetic panel in March 2014 17. 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 Severe over the face-steroid Dosepak prescribed 07/03/2015. Improved 07/10/2015. Further improved 07/24/2015, 08/07/2015, 08/28/2015 18. Diarrhea secondary to chemotherapy-encouraged to use Imodium 07/03/2015 19. Paronychia secondary to [facility]   Disposition: [name] appears stable. There is no clinical evidence of disease progression. We will follow-up on the CEA from today. He prefers to continue observation. We discussed  obtaining restaging CT scans in the next few weeks which would be at a three-month interval. He would like to  wait until the end of November. We will schedule the scans on 02/16/2016. He will return for a follow-up visit on 02/18/2016. He will contact the office in the interim with any problems.    Ned Card ANP/GNP-BC   01/14/2016  2:47 PM

## 2016-01-15 ENCOUNTER — Telehealth: Payer: Self-pay | Admitting: *Deleted

## 2016-01-15 LAB — CEA: CEA: 3 ng/mL (ref 0.0–4.7)

## 2016-01-15 LAB — CEA (IN HOUSE-CHCC): CEA (CHCC-IN HOUSE): 2.97 ng/mL (ref 0.00–5.00)

## 2016-01-15 NOTE — Telephone Encounter (Addendum)
Patient informed of his CEA result and OK to wait till after Thanksgiving for his scan. Sent message to managed care to work on his prior authorization.

## 2016-01-15 NOTE — Telephone Encounter (Signed)
CEA has increased from 1.5 to 3.0. OK to wait and do CT scan after Thanksgiving as discussed. Left VM for him to return call to navigator to obtain results and plan.

## 2016-01-19 ENCOUNTER — Telehealth: Payer: Self-pay | Admitting: Oncology

## 2016-01-19 NOTE — Telephone Encounter (Signed)
LEFT MESSAGE RE November APPOINTMENTS. PATIENT ALSO [place].

## 2016-02-16 ENCOUNTER — Other Ambulatory Visit (HOSPITAL_BASED_OUTPATIENT_CLINIC_OR_DEPARTMENT_OTHER): Payer: BC Managed Care – PPO

## 2016-02-16 ENCOUNTER — Ambulatory Visit (HOSPITAL_COMMUNITY)
Admission: RE | Admit: 2016-02-16 | Discharge: 2016-02-16 | Disposition: A | Discharge status: Home / Self Care | Payer: BC Managed Care – PPO | Source: Ambulatory Visit | Attending: Nurse Practitioner | Admitting: Nurse Practitioner

## 2016-02-16 ENCOUNTER — Encounter (HOSPITAL_COMMUNITY): Payer: Self-pay

## 2016-02-16 ENCOUNTER — Ambulatory Visit: Payer: BC Managed Care – PPO

## 2016-02-16 DIAGNOSIS — C20 Malignant neoplasm of rectum: Secondary | ICD-10-CM | POA: Diagnosis not present

## 2016-02-16 DIAGNOSIS — R599 Enlarged lymph nodes, unspecified: Secondary | ICD-10-CM | POA: Diagnosis not present

## 2016-02-16 DIAGNOSIS — C787 Secondary malignant neoplasm of liver and intrahepatic bile duct: Secondary | ICD-10-CM | POA: Diagnosis present

## 2016-02-16 DIAGNOSIS — Z95828 Presence of other vascular implants and grafts: Secondary | ICD-10-CM

## 2016-02-16 DIAGNOSIS — C7800 Secondary malignant neoplasm of unspecified lung: Secondary | ICD-10-CM | POA: Insufficient documentation

## 2016-02-16 LAB — CBC WITH DIFFERENTIAL/PLATELET
BASO%: 0.3 % (ref 0.0–2.0)
BASOS ABS: 0 10*3/uL (ref 0.0–0.1)
EOS ABS: 0.1 10*3/uL (ref 0.0–0.5)
EOS%: 1.9 % (ref 0.0–7.0)
HCT: 40.9 % (ref 38.4–49.9)
HEMOGLOBIN: 13.8 g/dL (ref 13.0–17.1)
LYMPH#: 0.6 10*3/uL — AB (ref 0.9–3.3)
LYMPH%: 17.5 % (ref 14.0–49.0)
MCH: 29.6 pg (ref 27.2–33.4)
MCHC: 33.7 g/dL (ref 32.0–36.0)
MCV: 87.6 fL (ref 79.3–98.0)
MONO#: 0.3 10*3/uL (ref 0.1–0.9)
MONO%: 9.5 % (ref 0.0–14.0)
NEUT#: 2.2 10*3/uL (ref 1.5–6.5)
NEUT%: 70.8 % (ref 39.0–75.0)
NRBC: 0 % (ref 0–0)
PLATELETS: 81 10*3/uL — AB (ref 140–400)
RBC: 4.67 10*6/uL (ref 4.20–5.82)
RDW: 13.2 % (ref 11.0–14.6)
WBC: 3.2 10*3/uL — ABNORMAL LOW (ref 4.0–10.3)

## 2016-02-16 MED ORDER — IOPAMIDOL (ISOVUE-300) INJECTION 61%
INTRAVENOUS | Status: AC
Start: 2016-02-16 — End: 2016-02-16
  Filled 2016-02-16: qty 100

## 2016-02-16 MED ORDER — SODIUM CHLORIDE 0.9 % IJ SOLN
10.0000 mL | INTRAMUSCULAR | Status: DC | PRN
  Administered 2016-02-16: 10 mL via INTRAVENOUS
  Filled 2016-02-16: qty 10

## 2016-02-16 MED ORDER — IOPAMIDOL (ISOVUE-300) INJECTION 61%
100.0000 mL | Freq: Once | INTRAVENOUS | Status: AC | PRN
  Administered 2016-02-16: 100 mL via INTRAVENOUS

## 2016-02-16 MED ORDER — SODIUM CHLORIDE 0.9 % IJ SOLN
INTRAMUSCULAR | Status: AC
  Filled 2016-02-16: qty 50

## 2016-02-16 MED ORDER — HEPARIN SOD (PORK) LOCK FLUSH 100 UNIT/ML IV SOLN
500.0000 [IU] | Freq: Once | INTRAVENOUS | Status: AC | PRN
  Administered 2016-02-16: 500 [IU] via INTRAVENOUS
  Filled 2016-02-16: qty 5

## 2016-02-16 NOTE — Progress Notes (Signed)
20" Power Loc inserted into [place] today.  Patient to CT for scan. Patient discharged after port flush and labs drawn in  infusion room.  Patient verbalized understanding.

## 2016-02-18 ENCOUNTER — Ambulatory Visit (HOSPITAL_BASED_OUTPATIENT_CLINIC_OR_DEPARTMENT_OTHER): Payer: BC Managed Care – PPO | Admitting: Oncology

## 2016-02-18 ENCOUNTER — Encounter: Payer: Self-pay | Admitting: *Deleted

## 2016-02-18 ENCOUNTER — Telehealth: Payer: Self-pay

## 2016-02-18 VITALS — BP 122/64 | HR 69 | Temp 97.9°F | Resp 17 | Ht 70.0 in | Wt 152.7 lb

## 2016-02-18 DIAGNOSIS — Z006 Encounter for examination for normal comparison and control in clinical research program: Secondary | ICD-10-CM

## 2016-02-18 DIAGNOSIS — C20 Malignant neoplasm of rectum: Secondary | ICD-10-CM | POA: Diagnosis not present

## 2016-02-18 DIAGNOSIS — C7800 Secondary malignant neoplasm of unspecified lung: Secondary | ICD-10-CM

## 2016-02-18 DIAGNOSIS — R197 Diarrhea, unspecified: Secondary | ICD-10-CM

## 2016-02-18 DIAGNOSIS — C787 Secondary malignant neoplasm of liver and intrahepatic bile duct: Secondary | ICD-10-CM | POA: Diagnosis not present

## 2016-02-18 NOTE — Telephone Encounter (Signed)
-----   Message from Milus Banister, MD sent at 02/18/2016  2:02 PM EST ----- Regarding: RE: Referral Kirtland Bouchard take care of this.  Thanks  Myya Meenach, Can you get in touch with him.  He needs stool tests (c. Diff by PCR and by toxin; routine stool culture, ova and parasites).  Also rov with me or extender within next week (double book with me if needed, thanks).  dj  ----- Message ----- From: Tania Ade, RN Sent: 02/18/2016  12:28 PM To: Milus Banister, MD, Barron Alvine, RN Subject: Referral                                       Blenda Wisecup, Dr. Benay Spice is asking for Dr. Ardis Hughs to see [facility] again to consider etiology/management of his diarrhea. He will have days of going 10-15 times. Very distressed by this. Has been off treatment since August. Anything you can do to get him in soon would be appreciated. Referral is in EPIC. Thanks, [place]

## 2016-02-18 NOTE — Telephone Encounter (Signed)
Left message on machine to call back  

## 2016-02-18 NOTE — Progress Notes (Signed)
Oncology Nurse Navigator Documentation  Oncology Nurse Navigator Flowsheets 02/18/2016  Navigator Location [facility]  Navigator Encounter Type Letter/Fax/Email  Telephone -  Patient Visit Type -  Treatment Phase Other--surveillance  Barriers/Navigation Needs Coordination of Care--F/U with GI  Education -  Interventions Coordination of Care--inbasket message to Dr. Ardis Hughs nurse to attempt to expedite his referral.  Referrals -  Coordination of Care -  Education Method -  Support Groups/Services -  Acuity -  Time Spent with Patient -

## 2016-02-18 NOTE — Progress Notes (Addendum)
Jacob [name] OFFICE PROGRESS NOTE   Diagnosis: Rectal cancer  INTERVAL HISTORY:   [name] returns as scheduled. He has been maintained off of chemotherapy since August. The skin rash has improved. He is working. He complains of intermittent diarrhea. He has diarrhea up to 15-20 times per day. The diarrhea is usually small volume. There are days when he does not have diarrhea. He has urgency.  Objective:  Vital signs in last 24 hours:  Blood pressure 122/64, pulse 69, temperature 97.9 F (36.6 C), temperature source Oral, resp. rate 17, height '5\' 10"'  (1.778 m), weight 152 lb 11.2 oz (69.3 kg), SpO2 100 %.    HEENT: Neck without mass Lymphatics: No cervical, supraclavicular, or inguinal nodes. "Shotty "left axillary nodes. Resp: Scattered end inspiratory rales at the posterior chest, no respiratory distress Cardio: Regular rate and rhythm GI: No hepatomegaly, nontender Vascular: No leg edema  Skin: A few acne-type lesions at the upper back and forehead   Portacath/PICC-without erythema  Lab Results:  Lab Results  Component Value Date   WBC 3.2 (L) 02/16/2016   HGB 13.8 02/16/2016   HCT 40.9 02/16/2016   MCV 87.6 02/16/2016   PLT 81 (L) 02/16/2016   NEUTROABS 2.2 02/16/2016      Lab Results  Component Value Date   CEA1 3.0 01/14/2016    Imaging:  Ct Chest W Contrast  Result Date: 02/16/2016 CLINICAL DATA:  Rectal cancer diagnosed in 2013 in 2014. Lung metastasis. Colon resection. Partial hepatectomy. Chemotherapy completed 1 month ago. EXAM: CT CHEST, ABDOMEN, AND PELVIS WITH CONTRAST TECHNIQUE: Multidetector CT imaging of the chest, abdomen and pelvis was performed following the standard protocol during bolus administration of intravenous contrast. CONTRAST:  1109m ISOVUE-300 IOPAMIDOL (ISOVUE-300) INJECTION 61% COMPARISON:  11/07/2015 FINDINGS: CT CHEST FINDINGS Cardiovascular: A left Port-A-Cath which terminates at the superior caval/ atrial  junction. Normal heart size, without pericardial effusion. No central pulmonary embolism, on this non-dedicated study. Mediastinum/Nodes: No supraclavicular adenopathy. Lateral AP window/ prevascular node measures 1.5 x 2.6 cm on image 22/ series 2. Compare 1.3 x 2.1 cm on the prior exam (when remeasured). Left infrahilar node measures 1.3 cm on image 30/series 2. Compare 6 mm on the prior. Lungs/Pleura: No pleural fluid. Mild right lower linear opacity may represent scarring. The most nodular component measures on the order of 4 x 9 mm today and is unchanged. Example image 115/series 4. Clustered left lower lobe pulmonary nodules on image 96/ series 4. The largest measures 5 mm today versus similar on the prior exam (when remeasured). Smaller adjacent nodules are new or significantly increased today. More lateral left lower lobe pulmonary nodule measures 12 mm on image 90/series 4 versus 7 mm on the prior exam (when remeasured). Musculoskeletal: No acute osseous abnormality. CT ABDOMEN PELVIS FINDINGS Hepatobiliary: Surgical changes of partial right hepatectomy. Caudate lobe enlargement is similar and likely compensatory. No new liver lesion. Cholecystectomy, without biliary ductal dilatation. Pancreas: Normal, without mass or ductal dilatation. Spleen: Improvement to resolution of splenomegaly. 12.8 cm craniocaudal today versus 14.2 cm on the prior exam. No focal splenic abnormality. Adrenals/Urinary Tract: Normal adrenal glands. Normal kidneys, without hydronephrosis. Normal urinary bladder. Stomach/Bowel: Normal stomach, without wall thickening. Partial colectomy. Normal terminal ileum. Normal small bowel. Vascular/Lymphatic: Normal caliber of the aorta and branch vessels. No abdominopelvic adenopathy. Reproductive: Normal prostate. Other: No significant free fluid. Presacral soft tissue thickening is similar and symmetric. No well-defined dominant mass. No evidence of omental or peritoneal disease.  Musculoskeletal: Bilateral  femoral head avascular necrosis. Degenerative disc disease at the lumbosacral junction. Disc bulges including at L4-5 and L5-S1. IMPRESSION: CT CHEST IMPRESSION 1. Progression of left-sided and pulmonary metastasis. 2. Enlargement of mediastinal and left infrahilar nodes, consistent with metastatic disease progression. CT ABDOMEN AND PELVIS IMPRESSION 1. Surgical changes throughout the abdomen and pelvis, without recurrent or metastatic disease. 2.  Improvement to resolution of splenomegaly. 3. 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 Electronically Signed   By: Abigail Miyamoto M.D.   On: 02/16/2016 12:58   Ct Abdomen Pelvis W Contrast  Result Date: 02/16/2016 CLINICAL DATA:  Rectal cancer diagnosed in 2013 in 2014. Lung metastasis. Colon resection. Partial hepatectomy. Chemotherapy completed 1 month ago. EXAM: CT CHEST, ABDOMEN, AND PELVIS WITH CONTRAST TECHNIQUE: Multidetector CT imaging of the chest, abdomen and pelvis was performed following the standard protocol during bolus administration of intravenous contrast. CONTRAST:  161m ISOVUE-300 IOPAMIDOL (ISOVUE-300) INJECTION 61% COMPARISON:  11/07/2015 FINDINGS: CT CHEST FINDINGS Cardiovascular: A left Port-A-Cath which terminates at the superior caval/ atrial junction. Normal heart size, without pericardial effusion. No central pulmonary embolism, on this non-dedicated study. Mediastinum/Nodes: No supraclavicular adenopathy. Lateral AP window/ prevascular node measures 1.5 x 2.6 cm on image 22/ series 2. Compare 1.3 x 2.1 cm on the prior exam (when remeasured). Left infrahilar node measures 1.3 cm on image 30/series 2. Compare 6 mm on the prior. Lungs/Pleura: No pleural fluid. Mild right lower linear opacity may represent scarring. The most nodular component measures on the order of 4 x 9 mm today and is unchanged. Example image 115/series 4. Clustered left lower lobe pulmonary nodules on image 96/  series 4. The largest measures 5 mm today versus similar on the prior exam (when remeasured). Smaller adjacent nodules are new or significantly increased today. More lateral left lower lobe pulmonary nodule measures 12 mm on image 90/series 4 versus 7 mm on the prior exam (when remeasured). Musculoskeletal: No acute osseous abnormality. CT ABDOMEN PELVIS FINDINGS Hepatobiliary: Surgical changes of partial right hepatectomy. Caudate lobe enlargement is similar and likely compensatory. No new liver lesion. Cholecystectomy, without biliary ductal dilatation. Pancreas: Normal, without mass or ductal dilatation. Spleen: Improvement to resolution of splenomegaly. 12.8 cm craniocaudal today versus 14.2 cm on the prior exam. No focal splenic abnormality. Adrenals/Urinary Tract: Normal adrenal glands. Normal kidneys, without hydronephrosis. Normal urinary bladder. Stomach/Bowel: Normal stomach, without wall thickening. Partial colectomy. Normal terminal ileum. Normal small bowel. Vascular/Lymphatic: Normal caliber of the aorta and branch vessels. No abdominopelvic adenopathy. Reproductive: Normal prostate. Other: No significant free fluid. Presacral soft tissue thickening is similar and symmetric. No well-defined dominant mass. No evidence of omental or peritoneal disease. Musculoskeletal: Bilateral femoral head avascular necrosis. Degenerative disc disease at the lumbosacral junction. Disc bulges including at L4-5 and L5-S1. IMPRESSION: CT CHEST IMPRESSION 1. Progression of left-sided and pulmonary metastasis. 2. Enlargement of mediastinal and left infrahilar nodes, consistent with metastatic disease progression. CT ABDOMEN AND PELVIS IMPRESSION 1. Surgical changes throughout the abdomen and pelvis, without recurrent or metastatic disease. 2.  Improvement to resolution of splenomegaly. 3. 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 Electronically Signed   By: KAbigail MiyamotoM.D.   On: 02/16/2016  12:58    Medications: I have reviewed the patient's current medications.  Assessment/Plan: 1. Rectal cancer. Partially obstructing mass noted 1-2 cm from the anal verge on a colonoscopy 03/06/2012. Endoscopic ultrasound 03/14/2012 with a 7.5 mm thick, 3.2 cm wide hypoechoic, irregularly bordered mass that clearly passed into and through  the muscularis propria layer of the distal rectal wall (uT3); 3 small (largest 7 mm) perirectal lymph nodes. The lymph nodes were all round, discrete, hypoechoic, homogenous; suspicious for malignant involvement (uN1).  No RAS mutation identified by [facility] 1 testing on the colon resection specimen 06/29/2012, APC alteration identified, microsatellite stable  He began radiation and concurrent Xeloda chemotherapy on 03/20/2012, completed 04/27/2012.   Low anterior resection/coloanal anastomosis and diverting ileostomy 06/29/2012 with the final pathology revealing a T2N0 tumor with extensive fibrosis and negative margins.   Cycle 1 of adjuvant CAPOX 07/19/2012. Cycle 5 of adjuvant CAPOX 10/11/2012.   CEA 2.5 on 11/14/2012.   CEA 14.6 03/08/2013.   Restaging CT evaluation 03/08/2013 with a 4 mm pulmonary nodule left lung base not identified on comparison exam; new liver lesions including a 29 x 26 mm irregular peripheral enhancing rounded lesion in the dome of the right hepatic lobe, a less well-defined new subcapsular lesion in the lateral right hepatic lobe measuring 12 mm, a new subcapsular lesion in the anterior right hepatic lobe adjacent to the gallbladder fossa measuring 10 mm; a rounded low-density lesion in the inferior right hepatic lobe measuring 10 mm compared to 7 mm on the prior study (radiologist commented this may represent an enlarged cyst).   MRI of the abdomen 04/12/2013 confirmed multiple T2 hyperintense metastatic lesions throughout the right liver   Initiation of FOLFIRI/Avastin with genotype based irinotecan dosing per the [facility]  study 04/05/2013.   Restaging CT evaluation on 06/20/2013 (after 2 cycles/4 treatments) showed improvement in the liver metastases and stable size of a left lower lobe pulmonary nodule now with central cavitation.   Continuation of FOLFIRI/Avastin.   Restaging CT evaluation 08/14/2013-decrease in the size of liver metastases, slight decrease in the size of a cavitary left lower lobe nodule, no evidence of disease progression.   Status post right hepatic lobectomy 09/17/2013. Pathology showed multiple foci of metastatic adenocarcinoma (5 nodules of metastatic adenocarcinoma 4 of which are subcapsular with the nodules ranging in size from 0.6-1.8 cm in greatest dimension). Margins not involved. Biopsy of a portal lymph node showed benign adipose tissue; no lymph node tissue or malignancy.   Normal CEA 11/06/2013   CT 11/06/2013 with a new pleural-based right lower chest lesion, slight in enlargement of the a left-sided lung nodule  CT 01/29/2014 with a decrease in the right lower chest pleural-based lesion and a slight increase of a left-sided lung lesion, other lung lesions were stable  CT chest 05/02/2014 with a decrease in the size of a right lower lobe pulmonary nodule months similar size of a dominant left lower lobe nodule, minimal enlargement of a smaller left lower lobe nodule, no new site of disease  CT chest 11/07/2014 with a slight increase in several left-sided lung nodules  CT chest 02/10/2015 with new small left hilar lymph nodes, a possible new left lower lobe nodule, and stability of other lung nodules  PET scan 03/12/2015 with hypermetabolic left hilar nodes, hypermetabolic left lower lobe nodules, hyper metabolic retroperitoneal nodes, intense hypermetabolism at the coloanal anastomosis, and hypermetabolic thickening in the presacral space  Cycle 1 FOLFIRI/PANITUMUMAB 05/21/2015  Cycle 2 FOLFIRI/PANITUMUMAB 06/05/2015  Cycle 3 FOLFIRI/PANITUMUMAB 06/19/2015  Cycle  4 FOLFIRI 07/10/2015  Cycle 5 FOLFIRI 07/24/2015  Restaging CTs 08/06/2015-resolution of hilar/retroperitoneal adenopathy, improvement in the hypermetabolic lung nodule, other lung nodules are stable, no new lesions  Cycle 6 FOLFIRI with PANITUMUMAB 08/28/2015 -panitumumab dose reduced  Cycle 7 FOLFIRI 09/11/2015-no panitumumab given  Cycle 8  FOLFIRI with PANITUMUMAB 09/25/2015-PANITUMUMAB dose reduced  Cycle 9 FOLFIRI 10/09/2015-no PANITUMUMAB given  Cycle 10 FOLFIRI with panitumumab 10/23/2015  CTs 11/06/2015-possible slight enlargement of a left lower lobe nodule, no other evidence of disease progression.  CTs 02/16/2016-enlargement of left-sided pulmonary nodules and mediastinal/left hilar nodes, improved splenomegaly 2. Irregular bowel habits/rectal bleeding secondary to #1. 3. History of Mild elevation of the liver enzymes. Question secondary to hepatic steatosis. 4. Indeterminate 8 mm posterior right liver lesion on the staging CT 03/06/2012. 5. Mildly elevated CEA at 5.7 on 03/06/2012. 6. History of radiation erythema at the groin and perineum 7. 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 No clinical evidence of thrombophlebitis or extravasation. 8. Delayed nausea following chemotherapy-Decadron prophylaxis was added with cycle 3 CAPOX-improved. 9. Ileostomy takedown 12/07/2012. 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 11. Port-A-Cath placement 12/31/204 12. Severe neutropenia secondary to chemotherapy following cycle 1 of FOLFIRI, chemotherapy was dose reduced and he received Neulasta with day 15 cycle 1  13. Nausea and vomiting following cycle 1 of FOLFIRI 14. Rectal stricture 15. Leukopenia/Thrombocytopenia-persistent, potentially a sequelae of chemotherapy or hepatic toxicity from chemotherapy/radiation. Bone marrow biopsy 11/20/2014 showed cellular bone marrow with trilineage  hematopoiesis. Significant dyspoiesis was not present and there was no evidence of metastatic carcinoma. Cytogenetic analysis showed the presence of normal male chromosomes with no observable clonal chromosomal abnormalities.  probable cirrhosis 16. Genetic testing-negative genetic panel in March 2014 17. 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 Severe over the face-steroid Dosepak prescribed 07/03/2015. Improved 07/10/2015. Further improved 07/24/2015, 08/07/2015, 08/28/2015 18. Diarrhea secondary to chemotherapy-encouraged to use Imodium 07/03/2015 19. Paronychia secondary to [facility]   Disposition:  Mr. Cajuste appears unchanged. The restaging CTs reveal minimal progression of the metastatic rectal cancer in the lungs. I doubt the diarrhea/urgency is related to the metastatic rectal cancer or systemic therapy. His symptoms have been present to some degree since he underwent surgery and radiation. He is very bothered by the urgency and frequent bowel movements. I will refer him to Dr. Ardis Hughs to get his opinion regarding the etiology of the diarrhea and treatment options.  Mr. Swiatek would like to remain off of systemic therapy for now. He will return for an office visit in 6 weeks.  I reviewed the CT images with Mr. Brammer and his wife.  His ECOG performance status is measured at 1 today.  Betsy Coder, MD  02/18/2016  11:57 AM

## 2016-02-19 NOTE — Telephone Encounter (Signed)
Left message on machine to call back  

## 2016-02-19 NOTE — Telephone Encounter (Signed)
Pt has been scheduled with Dr Ardis Hughs on 02/25/16 and stool test are in EPIC.  He will come by tomorrow and pick those up.

## 2016-02-20 ENCOUNTER — Other Ambulatory Visit: Payer: BC Managed Care – PPO

## 2016-02-23 ENCOUNTER — Other Ambulatory Visit: Payer: BC Managed Care – PPO

## 2016-02-23 ENCOUNTER — Other Ambulatory Visit: Payer: Self-pay | Admitting: Gastroenterology

## 2016-02-23 DIAGNOSIS — R197 Diarrhea, unspecified: Secondary | ICD-10-CM

## 2016-02-24 LAB — C. DIFFICILE GDH AND TOXIN A/B
C. difficile GDH: NOT DETECTED
C. difficile Toxin A/B: NOT DETECTED

## 2016-02-24 LAB — CLOSTRIDIUM DIFFICILE BY PCR: CDIFFPCR: NOT DETECTED

## 2016-02-24 LAB — OVA AND PARASITE EXAMINATION: OP: NONE SEEN

## 2016-02-25 ENCOUNTER — Encounter: Payer: Self-pay | Admitting: Gastroenterology

## 2016-02-25 ENCOUNTER — Ambulatory Visit (INDEPENDENT_AMBULATORY_CARE_PROVIDER_SITE_OTHER): Payer: BC Managed Care – PPO | Admitting: Gastroenterology

## 2016-02-25 VITALS — BP 100/60 | HR 78 | Ht 70.0 in | Wt 151.0 lb

## 2016-02-25 DIAGNOSIS — K624 Stenosis of anus and rectum: Secondary | ICD-10-CM

## 2016-02-25 DIAGNOSIS — C20 Malignant neoplasm of rectum: Secondary | ICD-10-CM

## 2016-02-25 DIAGNOSIS — R197 Diarrhea, unspecified: Secondary | ICD-10-CM | POA: Diagnosis not present

## 2016-02-25 NOTE — Progress Notes (Signed)
Review of pertinent gastrointestinal problems: 1. Metastatic rectal cancer (lung, liver).  Originally diagnosed Dr. Ardis Hughs colonoscopy 2013 [facility]).  Neoadjuvant chemo/xrt and then LAR 06/2012. Ileostomy takedown 9/14.  Right hepatectomy for cancer mets 08/2013 (cholecystectomy at same time). PET 02/2015 PET showed spread to lung, retroperitoneum, coloanal anastomosis.      HPI: This is a   very pleasant 45 year old man whom I last saw 2 or 3 years ago the initial diagnosis of his rectal cancer.  Chief complaint is persistent loose stools, difficulty with his bowels, diarrhea  He can have up to 40 low volume stools a day, he will often have to sit on the toilet for several hours feeling the urge to move his bowels without any success. He had been dilating and anal stricture up until about a year ago when he was restarted on chemotherapy and then dilation stopped. He was never really about doing dilations at home. He feels some protuberant tissue at times especially when he has to strain his bowels to get any stool out.  His bowel habits are really debilitating his life. He is unable to leave the house.  Most recent CT scan showed mild progression of lung metastasis.  Gi path panel, c diff negative las week both negative.  ROS: complete GI ROS as described in HPI.  Constitutional:  No unintentional weight loss   Past Medical History:  Diagnosis Date  . Allergic state 01/19/2012  . Anemia 12/31/2012  . Anxiety   . Cancer ([place]) 03/06/12   rectal bx=Adenocarcinoma  PT HAD RADIATION , CHEMO SURGERY  . Chicken pox as a child   ?  Marland Kitchen Dysrhythmia as child   HX OF IRREGULAR HB AT TIME OF TONSILLECTOMY - SURGERY WAS NOT DONE-CAN'T REMEMBER ANY OTHER DETAILS- NEVER HAD THE SURGERY.  . Elevated liver function tests 01/19/2012  . Fatty liver   . Hernia 6 months old  . History of diarrhea    better since chemo finished  . Hx of migraines   . Lung abnormality 2015  . Lung nodule 08/2013  . Lung  nodule, multiple 11/29/2013  . Migraine headache as a child  . Numbness    TOES OF BOTH FEET.  Marland Kitchen Overweight(278.02) 01/19/2012  . Pain    LEFT HEEL PAIN -SEVERE ESPECIALLY AFTER SITTING OR LYING DOWN - DIFFICULT TO GET OUT OF BED AND WALK IN THE MORNINGS BECAUSE OF HEEL PAIN  . 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 Preventative health care 01/19/2012  . Radiation 03/20/12-04/27/12   Pelvis 50.4 gray Rectal cancer  . Rectal cancer ([place]) 03/06/12   biopsy-adenocarcinoma  . Reflux 01/19/2012  . Sun-damaged skin 01/19/2012    Past Surgical History:  Procedure Laterality Date  . CHOLECYSTECTOMY N/A 09/17/2013   Procedure: CHOLECYSTECTOMY;  Surgeon: Stark Klein, MD;  Location: [place];  Service: General;  Laterality: N/A;  . CYST EXCISION     L EAR AREA  . EUS  03/14/2012   Procedure: LOWER ENDOSCOPIC ULTRASOUND (EUS);  Surgeon: Milus Banister, MD;  Location: [facility];  Service: Endoscopy;  Laterality: N/A;  . HERNIA REPAIR  6 months old   right inguinal repair  . ILEOSTOMY CLOSURE N/A 12/07/2012   Procedure: ILEOSTOMY TAKEDOWN;  Surgeon: Leighton Ruff, MD;  Location: [facility];  Service: General;  Laterality: N/A;  . LAPAROSCOPIC LOW ANTERIOR RESECTION N/A 06/29/2012   Procedure: LAPAROSCOPIC LOW ANTERIOR RESECTION, mobilization splenic flexure,coloanal anastomosis,diverting ileostomy;  Surgeon: Leighton Ruff,  MD;  Location: [facility];  Service: General;  Laterality: N/A;  . LAPAROSCOPY N/A 09/17/2013   Procedure: LAPAROSCOPY DIAGNOSTIC;  Surgeon: Stark Klein, MD;  Location: [place];  Service: General;  Laterality: N/A;  . LIVER ULTRASOUND N/A 09/17/2013   Procedure: LIVER ULTRASOUND;  Surgeon: Stark Klein, MD;  Location: [place];  Service: General;  Laterality: N/A;  . OPEN HEPATECTOMY  N/A 09/17/2013   Procedure: OPEN HEPATECTOMY;  Surgeon: Stark Klein, MD;  Location: [place];  Service: General;  Laterality: N/A;  . PORTACATH PLACEMENT Left  03/21/2013   Procedure: INSERTION PORT-A-CATH;  Surgeon: Leighton Ruff, MD;  Location: [facility];  Service: General;  Laterality: Left;  . RECTAL BIOPSY  03/06/12   distal mass1-2cm from anal verge=Adenocarcinoma    Current Outpatient Prescriptions  Medication Sig Dispense Refill  . ALPRAZolam (XANAX) 0.25 MG tablet Take 2 tablets (0.5 mg total) by mouth daily as needed for anxiety or sleep. 60 tablet 0  . docusate sodium (COLACE) 100 MG capsule Take 100 mg by mouth daily as needed for mild constipation or moderate constipation. Occasionally bid    . lidocaine-prilocaine (EMLA) cream Apply 1 application topically as needed. Apply to port site 1 hour prior to use. 30 g 3  . naproxen sodium (ALEVE) 220 MG tablet Take 220 mg by mouth daily as needed (for headache). Reported on 08/28/2015    . prochlorperazine (COMPAZINE) 10 MG tablet Take 1 tablet (10 mg total) by mouth every 6 (six) hours as needed for nausea or vomiting. 30 tablet 2   No current facility-administered medications for this visit.    Facility-Administered Medications Ordered in Other Visits  Medication Dose Route Frequency Provider Last Rate Last Dose  . atropine injection 0.5 mg  0.5 mg Intravenous Once PRN Ladell Pier, MD        Allergies as of 02/25/2016 - Review Complete 02/25/2016  Allergen Reaction Noted  . Penicillins Rash 01/19/2012    Family History  Problem Relation Age of Onset  . Adopted: Yes  . Cancer Father   . Heart disease Father   . Colon cancer Neg Hx     Social History   Social History  . Marital status: Married    Spouse name: N/A  . Number of children: 1  . Years of education: N/A   Occupational History  . Scultor/Professor [employer]   Social History Main Topics  . Smoking status: Never Smoker  . Smokeless tobacco: Never Used  . Alcohol use 0.0 oz/week     Comment: RARE   . Drug use: No     Comment: marijuana  . Sexual activity: Yes    Partners: Female   Other Topics Concern   . Not on file   Social History Narrative   Married to wife, [name]   Has one 46 year old child   Occupation: Emergency planning/management officer at [employer]     Physical Exam: BP 100/60   Pulse 78   Ht '5\' 10"'$  (1.778 m)   Wt 151 lb (68.5 kg)   BMI 21.67 kg/m  Constitutional: generally well-appearing Psychiatric: alert and oriented x3 Abdomen: soft, nontender, nondistended, no obvious ascites, no peritoneal signs, normal bowel sounds No peripheral edema noted in lower extremities Rectal exam; focal anal stricture with a rim of tight tissue 7 or 8 mm across, I was unable to pass my index finger through this. I could see there was some cherry red tissue within.   Assessment and  plan: 45 y.o. male with metastatic rectal cancer, likely anal, rectal stricture  He is really having symptoms that are consistent with anatomic, overflow type diarrhea. He has had radiation to the area, he has a low rectal anastomosis, also previous PET scan suggested some uptake, PET avidity in the area of his anastomosis. I think it is important to get a good evaluation of his rectum with [place] sigmoidoscopy. I suspect this is overflow related some type of stricture. He clearly has a rim of anal tissue that is causing some trouble I wonder if he also has anastomotic recurrence of cancer or anastomotic stricturing. We will proceed with flex sigmoidoscopy early next week.   Owens Loffler, MD [facility] 02/25/2016, 11:55 AM

## 2016-02-25 NOTE — Patient Instructions (Addendum)
     You have been scheduled for a flexible sigmoidoscopy. Please follow the written instructions given to you at your visit today. If you use inhalers (even only as needed), please bring them with you on the day of your procedure.    I appreciate the opportunity to care for you.

## 2016-02-26 ENCOUNTER — Telehealth: Payer: Self-pay | Admitting: General Practice

## 2016-02-26 NOTE — Telephone Encounter (Signed)
Left msg regarding January 2018 appts.

## 2016-02-27 LAB — STOOL CULTURE

## 2016-03-01 ENCOUNTER — Telehealth: Payer: Self-pay

## 2016-03-01 ENCOUNTER — Ambulatory Visit (AMBULATORY_SURGERY_CENTER): Payer: BC Managed Care – PPO | Admitting: Gastroenterology

## 2016-03-01 ENCOUNTER — Encounter: Payer: Self-pay | Admitting: Gastroenterology

## 2016-03-01 VITALS — BP 124/88 | HR 79 | Temp 100.0°F | Resp 16 | Ht 70.0 in | Wt 151.0 lb

## 2016-03-01 DIAGNOSIS — K624 Stenosis of anus and rectum: Secondary | ICD-10-CM

## 2016-03-01 DIAGNOSIS — R197 Diarrhea, unspecified: Secondary | ICD-10-CM | POA: Diagnosis not present

## 2016-03-01 DIAGNOSIS — K6289 Other specified diseases of anus and rectum: Secondary | ICD-10-CM

## 2016-03-01 DIAGNOSIS — Z85048 Personal history of other malignant neoplasm of rectum, rectosigmoid junction, and anus: Secondary | ICD-10-CM

## 2016-03-01 MED ORDER — SODIUM CHLORIDE 0.9 % IV SOLN: 500.0000 mL | INTRAVENOUS | Status: DC

## 2016-03-01 NOTE — Patient Instructions (Signed)
Discharge instructions given. Biopsies taken. Resume previous medications. YOU HAD AN ENDOSCOPIC PROCEDURE TODAY AT THE  ENDOSCOPY CENTER:   Refer to the procedure report that was given to you for any specific questions about what was found during the examination.  If the procedure report does not answer your questions, please call your gastroenterologist to clarify.  If you requested that your care partner not be given the details of your procedure findings, then the procedure report has been included in a sealed envelope for you to review at your convenience later.  YOU SHOULD EXPECT: Some feelings of bloating in the abdomen. Passage of more gas than usual.  Walking can help get rid of the air that was put into your GI tract during the procedure and reduce the bloating. If you had a lower endoscopy (such as a colonoscopy or flexible sigmoidoscopy) you may notice spotting of blood in your stool or on the toilet paper. If you underwent a bowel prep for your procedure, you may not have a normal bowel movement for a few days.  Please Note:  You might notice some irritation and congestion in your nose or some drainage.  This is from the oxygen used during your procedure.  There is no need for concern and it should clear up in a day or so.  SYMPTOMS TO REPORT IMMEDIATELY:   Following lower endoscopy (colonoscopy or flexible sigmoidoscopy):  Excessive amounts of blood in the stool  Significant tenderness or worsening of abdominal pains  Swelling of the abdomen that is new, acute  Fever of 100F or higher   For urgent or emergent issues, a gastroenterologist can be reached at any hour by calling [phone].   DIET:  We do recommend a small meal at first, but then you may proceed to your regular diet.  Drink plenty of fluids but you should avoid alcoholic beverages for 24 hours.  ACTIVITY:  You should plan to take it easy for the rest of today and you should NOT DRIVE or use heavy  machinery until tomorrow (because of the sedation medicines used during the test).    FOLLOW UP: Our staff will call the number listed on your records the next business day following your procedure to check on you and address any questions or concerns that you may have regarding the information given to you following your procedure. If we do not reach you, we will leave a message.  However, if you are feeling well and you are not experiencing any problems, there is no need to return our call.  We will assume that you have returned to your regular daily activities without incident.  If any biopsies were taken you will be contacted by phone or by letter within the next 1-3 weeks.  Please call us at [phone] if you have not heard about the biopsies in 3 weeks.    SIGNATURES/CONFIDENTIALITY: You and/or your care partner have signed paperwork which will be entered into your electronic medical record.  These signatures attest to the fact that that the information above on your After Visit Summary has been reviewed and is understood.  Full responsibility of the confidentiality of this discharge information lies with you and/or your care-partner. 

## 2016-03-01 NOTE — Telephone Encounter (Signed)
-----   Message from Jacob Banister, MD sent at 03/01/2016 11:46 AM EST ----- Chong Sicilian, He needs to get back in with Earlie Counts (pelvic therapist) for anal stricture.  Also rov with me in 2 months.  Thanks  dj

## 2016-03-01 NOTE — Progress Notes (Signed)
To recovery, report to McCoy, RN, VSS 

## 2016-03-01 NOTE — Progress Notes (Signed)
Called to room to assist during endoscopic procedure.  Patient ID and intended procedure confirmed with present staff. Received instructions for my participation in the procedure from the performing physician.  

## 2016-03-01 NOTE — Op Note (Signed)
[facility] Patient Name: Jacob [name] Procedure Date: 03/01/2016 11:21 AM MRN: [id number] Endoscopist: Milus Banister , MD Age: 45 Referring MD:  Date of Birth: Jan 22, 1971 Gender: Male Account #: [id number] Procedure:                Flexible Sigmoidoscopy Indications:              Diarrhe; Metastatic rectal cancer (lung, liver).                            Originally diagnosed Dr. Ardis Hughs colonoscopy 2013                            [facility]). Neoadjuvant chemo/xrt and then LAR 06/2012                            with coloanal anastomosis. Ileostomy takedown 9/14.                            Right hepatectomy for cancer mets 08/2013                            (cholecystectomy at same time). PET 02/2015 PET                            showed spread to lung, retroperitoneum, coloanal                            anastomosis. Procedure:                Pre-Anesthesia Assessment:                           - Prior to the procedure, a History and Physical                            was performed, and patient medications and                            allergies were reviewed. The patient's tolerance of                            previous anesthesia was also reviewed. The risks                            and benefits of the procedure and the sedation                            options and risks were discussed with the patient.                            All questions were answered, and informed consent                            was obtained. Prior Anticoagulants: The patient has  taken no previous anticoagulant or antiplatelet                            agents. ASA Grade Assessment: II - A patient with                            mild systemic disease. After reviewing the risks                            and benefits, the patient was deemed in                            satisfactory condition to undergo the procedure.                           After obtaining  informed consent, the scope was                            passed under direct vision. The Model CF-HQ190L                            [id number]) scope was introduced through the anus                            and advanced to the the sigmoid colon. The flexible                            sigmoidoscopy was accomplished without difficulty.                            The patient tolerated the procedure well. Scope In: 11:24:48 AM Scope Out: 11:32:01 AM Total Procedure Duration: 0 hours 7 minutes 13 seconds  Findings:                 Focal anal stricture; this is a rim of tight tissue                            that I partially disrupted with digital examination                            and further disrupted with introduction of adult                            colonoscopy (diameter 9-21m across). Just proximal                            to this presumed anastomotic stricture the mucosa                            is beefy red, slightly friable for about 1-2cm.                            This does not appear neoplastic. From his history I  suspect this is a small segment of rectum that is                            prolapsing through the stricture periodically. I                            biopsied this segment. Proximally the rectum is                            slightly granular appearing but otherwise normal. Complications:            No immediate complications. Estimated blood loss:                            None. Estimated Blood Loss:     Estimated blood loss: none. 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 Neoadjuvant XRT may                            contribute. I believe the stricture is causing most                            of his bowel issues and also causing a short                            segment of distal rectum to prolapse at times of                             straining. I dilated the stricture digitally and                            then with the 64m diameter colonoscope and                            biopsied just proximal to the stricture. Recommendation:           - Discharge patient to home (ambulatory).                           - Await pathology results.                           - My office will help getting you back on to see                            pelvic therapist CEarlie Counts the stricture needs                            dilation chronically.                           -  Return office visit with me in 2 months. Milus Banister, MD 03/01/2016 11:45:09 AM This report has been signed electronically.

## 2016-03-02 ENCOUNTER — Telehealth: Payer: Self-pay

## 2016-03-02 NOTE — Telephone Encounter (Signed)
Number identifier, left message, follow-up  

## 2016-03-03 ENCOUNTER — Telehealth: Payer: Self-pay

## 2016-03-03 NOTE — Telephone Encounter (Signed)
  Follow up Call-  Call back number 03/01/2016 05/24/2014  Post procedure Call Back phone  # [phone]  Permission to leave phone message Yes Yes  Some recent data might be hidden    Patient was called for follow up after his procedure on 03/01/2016. No answer at the number given for follow up phone call. A message was left on the answering machine.

## 2016-03-03 NOTE — Telephone Encounter (Signed)
Patient left message w/answering service on 12/12 at 3:51pm stating that he is returning a phone call regarding results

## 2016-03-03 NOTE — Telephone Encounter (Signed)
Appt is made with Earlie Counts on 03/08/16 11:30 am [place] [address].  Appt made with Dr Ardis Hughs on 05/14/16 8:45 am.   Left message on machine to call back

## 2016-03-04 NOTE — Telephone Encounter (Signed)
Left message on machine to call back  

## 2016-03-04 NOTE — Telephone Encounter (Signed)
Patient calling Patty back

## 2016-03-08 ENCOUNTER — Ambulatory Visit: Payer: BC Managed Care – PPO | Attending: Physical Therapy | Admitting: Physical Therapy

## 2016-03-08 NOTE — Telephone Encounter (Signed)
Left message on machine to call back  Letter mailed with appt information for Jacob [name].

## 2016-03-10 ENCOUNTER — Encounter: Payer: Self-pay | Admitting: Gastroenterology

## 2016-03-12 ENCOUNTER — Telehealth: Payer: Self-pay | Admitting: Gastroenterology

## 2016-03-12 NOTE — Telephone Encounter (Signed)
Pt states he already received results, questions answered.

## 2016-03-31 ENCOUNTER — Ambulatory Visit: Payer: BC Managed Care – PPO

## 2016-03-31 ENCOUNTER — Other Ambulatory Visit: Payer: Self-pay | Admitting: *Deleted

## 2016-03-31 ENCOUNTER — Other Ambulatory Visit (HOSPITAL_BASED_OUTPATIENT_CLINIC_OR_DEPARTMENT_OTHER): Payer: BC Managed Care – PPO | Admitting: *Deleted

## 2016-03-31 ENCOUNTER — Other Ambulatory Visit (HOSPITAL_BASED_OUTPATIENT_CLINIC_OR_DEPARTMENT_OTHER): Payer: BC Managed Care – PPO

## 2016-03-31 ENCOUNTER — Ambulatory Visit (HOSPITAL_BASED_OUTPATIENT_CLINIC_OR_DEPARTMENT_OTHER): Payer: BC Managed Care – PPO | Admitting: Oncology

## 2016-03-31 ENCOUNTER — Telehealth: Payer: Self-pay | Admitting: Oncology

## 2016-03-31 VITALS — BP 122/64 | HR 72 | Temp 98.1°F | Resp 18 | Wt 156.9 lb

## 2016-03-31 DIAGNOSIS — C20 Malignant neoplasm of rectum: Secondary | ICD-10-CM

## 2016-03-31 DIAGNOSIS — Z95828 Presence of other vascular implants and grafts: Secondary | ICD-10-CM

## 2016-03-31 DIAGNOSIS — C787 Secondary malignant neoplasm of liver and intrahepatic bile duct: Secondary | ICD-10-CM

## 2016-03-31 DIAGNOSIS — K6289 Other specified diseases of anus and rectum: Secondary | ICD-10-CM | POA: Diagnosis not present

## 2016-03-31 LAB — CBC WITH DIFFERENTIAL/PLATELET
BASO%: 0.4 % (ref 0.0–2.0)
Basophils Absolute: 0 10*3/uL (ref 0.0–0.1)
EOS%: 2 % (ref 0.0–7.0)
Eosinophils Absolute: 0.1 10*3/uL (ref 0.0–0.5)
HEMATOCRIT: 39.8 % (ref 38.4–49.9)
HGB: 13.2 g/dL (ref 13.0–17.1)
LYMPH#: 0.5 10*3/uL — AB (ref 0.9–3.3)
LYMPH%: 20.9 % (ref 14.0–49.0)
MCH: 29.7 pg (ref 27.2–33.4)
MCHC: 33.2 g/dL (ref 32.0–36.0)
MCV: 89.4 fL (ref 79.3–98.0)
MONO#: 0.2 10*3/uL (ref 0.1–0.9)
MONO%: 9.6 % (ref 0.0–14.0)
NEUT%: 67.1 % (ref 39.0–75.0)
NEUTROS ABS: 1.7 10*3/uL (ref 1.5–6.5)
PLATELETS: 105 10*3/uL — AB (ref 140–400)
RBC: 4.45 10*6/uL (ref 4.20–5.82)
RDW: 14.6 % (ref 11.0–14.6)
WBC: 2.5 10*3/uL — AB (ref 4.0–10.3)

## 2016-03-31 LAB — COMPREHENSIVE METABOLIC PANEL
ALBUMIN: 3.9 g/dL (ref 3.5–5.0)
ALK PHOS: 61 U/L (ref 40–150)
ALT: 14 U/L (ref 0–55)
ANION GAP: 9 meq/L (ref 3–11)
AST: 13 U/L (ref 5–34)
BUN: 10.4 mg/dL (ref 7.0–26.0)
CALCIUM: 9.5 mg/dL (ref 8.4–10.4)
CO2: 26 mEq/L (ref 22–29)
CREATININE: 1 mg/dL (ref 0.7–1.3)
Chloride: 105 mEq/L (ref 98–109)
EGFR: >90 mL/min/{1.73_m2} (ref >90)
Glucose: 95 mg/dl (ref 70–140)
Potassium: 4.1 mEq/L (ref 3.5–5.1)
Sodium: 140 mEq/L (ref 136–145)
Total Bilirubin: 0.73 mg/dL (ref 0.20–1.20)
Total Protein: 6.7 g/dL (ref 6.4–8.3)

## 2016-03-31 LAB — CEA (IN HOUSE-CHCC): CEA (CHCC-In House): 8.48 ng/mL — ABNORMAL HIGH (ref 0.00–5.00)

## 2016-03-31 MED ORDER — HEPARIN SOD (PORK) LOCK FLUSH 100 UNIT/ML IV SOLN
500.0000 [IU] | Freq: Once | INTRAVENOUS | Status: AC | PRN
  Administered 2016-03-31: 500 [IU] via INTRAVENOUS
  Filled 2016-03-31: qty 5

## 2016-03-31 MED ORDER — SODIUM CHLORIDE 0.9 % IJ SOLN
10.0000 mL | INTRAMUSCULAR | Status: DC | PRN
  Administered 2016-03-31: 10 mL via INTRAVENOUS
  Filled 2016-03-31: qty 10

## 2016-03-31 NOTE — Telephone Encounter (Signed)
Appointments scheduled per 1/10 LOS. Patient given AVS report and calendars with future scheduled appointments. °

## 2016-03-31 NOTE — Progress Notes (Signed)
[facility] OFFICE PROGRESS NOTE   Diagnosis: Rectal cancer  INTERVAL HISTORY:   Jacob [name] returns as scheduled. He continues to have difficulty with irregular bowel habits. He saw Dr. Ardis Hughs and underwent a sigmoidoscopy 03/01/2016. A focal anal stricture was noted. The mucosa proximal to the stricture is friable. The stricture was dilated digitally and with a 10 mm colonoscope. A biopsy revealed no malignancy. Dr. Ardis Hughs recommended Jacob [name] see the pelvic physical therapist. He has not done this yet. He has resumed pelvic exercises.  Objective:  Vital signs in last 24 hours:  Blood pressure 122/64, pulse 72, temperature 98.1 F (36.7 C), temperature source Oral, resp. rate 18, weight 156 lb 14.4 oz (71.2 kg), SpO2 100 %.  Resp: Lungs clear bilaterally Cardio: Regular rate and rhythm GI: No hepatomegaly, nontender, no mass Vascular: No leg edema   Portacath/PICC-without erythema  Lab Results:  Lab Results  Component Value Date   WBC 2.5 (L) 03/31/2016   HGB 13.2 03/31/2016   HCT 39.8 03/31/2016   MCV 89.4 03/31/2016   PLT 105 (L) 03/31/2016   NEUTROABS 1.7 03/31/2016     Medications: I have reviewed the patient's current medications.  Assessment/Plan: 1. Rectal cancer. Partially obstructing mass noted 1-2 cm from the anal verge on a colonoscopy 03/06/2012. Endoscopic ultrasound 03/14/2012 with a 7.5 mm thick, 3.2 cm wide hypoechoic, irregularly bordered mass that clearly passed into and through the muscularis propria layer of the distal rectal wall (uT3); 3 small (largest 7 mm) perirectal lymph nodes. The lymph nodes were all round, discrete, hypoechoic, homogenous; suspicious for malignant involvement (uN1).  No RAS mutation identified by [facility] 1 testing on the colon resection specimen 06/29/2012, APC alteration identified, microsatellite stable  He began radiation and concurrent Xeloda chemotherapy on 03/20/2012, completed 04/27/2012.   Low  anterior resection/coloanal anastomosis and diverting ileostomy 06/29/2012 with the final pathology revealing a T2N0 tumor with extensive fibrosis and negative margins.   Cycle 1 of adjuvant CAPOX 07/19/2012. Cycle 5 of adjuvant CAPOX 10/11/2012.   CEA 2.5 on 11/14/2012.   CEA 14.6 03/08/2013.   Restaging CT evaluation 03/08/2013 with a 4 mm pulmonary nodule left lung base not identified on comparison exam; new liver lesions including a 29 x 26 mm irregular peripheral enhancing rounded lesion in the dome of the right hepatic lobe, a less well-defined new subcapsular lesion in the lateral right hepatic lobe measuring 12 mm, a new subcapsular lesion in the anterior right hepatic lobe adjacent to the gallbladder fossa measuring 10 mm; a rounded low-density lesion in the inferior right hepatic lobe measuring 10 mm compared to 7 mm on the prior study (radiologist commented this may represent an enlarged cyst).   MRI of the abdomen 04/12/2013 confirmed multiple T2 hyperintense metastatic lesions throughout the right liver   Initiation of FOLFIRI/Avastin with genotype based irinotecan dosing per the [facility] study 04/05/2013.   Restaging CT evaluation on 06/20/2013 (after 2 cycles/4 treatments) showed improvement in the liver metastases and stable size of a left lower lobe pulmonary nodule now with central cavitation.   Continuation of FOLFIRI/Avastin.   Restaging CT evaluation 08/14/2013-decrease in the size of liver metastases, slight decrease in the size of a cavitary left lower lobe nodule, no evidence of disease progression.   Status post right hepatic lobectomy 09/17/2013. Pathology showed multiple foci of metastatic adenocarcinoma (5 nodules of metastatic adenocarcinoma 4 of which are subcapsular with the nodules ranging in size from 0.6-1.8 cm in greatest dimension). Margins not involved.  Biopsy of a portal lymph node showed benign adipose tissue; no lymph node tissue or malignancy.    Normal CEA 11/06/2013   CT 11/06/2013 with a new pleural-based right lower chest lesion, slight in enlargement of the a left-sided lung nodule  CT 01/29/2014 with a decrease in the right lower chest pleural-based lesion and a slight increase of a left-sided lung lesion, other lung lesions were stable  CT chest 05/02/2014 with a decrease in the size of a right lower lobe pulmonary nodule months similar size of a dominant left lower lobe nodule, minimal enlargement of a smaller left lower lobe nodule, no new site of disease  CT chest 11/07/2014 with a slight increase in several left-sided lung nodules  CT chest 02/10/2015 with new small left hilar lymph nodes, a possible new left lower lobe nodule, and stability of other lung nodules  PET scan 03/12/2015 with hypermetabolic left hilar nodes, hypermetabolic left lower lobe nodules, hyper metabolic retroperitoneal nodes, intense hypermetabolism at the coloanal anastomosis, and hypermetabolic thickening in the presacral space  Cycle 1 FOLFIRI/PANITUMUMAB 05/21/2015  Cycle 2 FOLFIRI/PANITUMUMAB 06/05/2015  Cycle 3 FOLFIRI/PANITUMUMAB 06/19/2015  Cycle 4 FOLFIRI 07/10/2015  Cycle 5 FOLFIRI 07/24/2015  Restaging CTs 08/06/2015-resolution of hilar/retroperitoneal adenopathy, improvement in the hypermetabolic lung nodule, other lung nodules are stable, no new lesions  Cycle 6 FOLFIRI with PANITUMUMAB 08/28/2015 -panitumumab dose reduced  Cycle 7 FOLFIRI 09/11/2015-no panitumumab given  Cycle 8 FOLFIRI with PANITUMUMAB 09/25/2015-PANITUMUMAB dose reduced  Cycle 9 FOLFIRI 10/09/2015-no PANITUMUMAB given  Cycle 10 FOLFIRI with panitumumab 10/23/2015  CTs 11/06/2015-possible slight enlargement of a left lower lobe nodule, no other evidence of disease progression.  CTs 02/16/2016-enlargement of left-sided pulmonary nodules and mediastinal/left hilar nodes, improved splenomegaly 2. Irregular bowel habits/rectal bleeding secondary to  #1. 3. History of Mild elevation of the liver enzymes. Question secondary to hepatic steatosis. 4. Indeterminate 8 mm posterior right liver lesion on the staging CT 03/06/2012. 5. Mildly elevated CEA at 5.7 on 03/06/2012. 6. History of radiation erythema at the groin and perineum 7. 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 No clinical evidence of thrombophlebitis or extravasation. 8. Delayed nausea following chemotherapy-Decadron prophylaxis was added with cycle 3 CAPOX-improved. 9. Ileostomy takedown 12/07/2012. 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 11. Port-A-Cath placement 12/31/204 12. Severe neutropenia secondary to chemotherapy following cycle 1 of FOLFIRI, chemotherapy was dose reduced and he received Neulasta with day 15 cycle 1  13. Nausea and vomiting following cycle 1 of FOLFIRI 14. Rectal stricture-manual/colonoscopic dilatation by Dr. Ardis Hughs 03/01/2016 15. Leukopenia/Thrombocytopenia-persistent, potentially a sequelae of chemotherapy or hepatic toxicity from chemotherapy/radiation. Bone marrow biopsy 11/20/2014 showed cellular bone marrow with trilineage hematopoiesis. Significant dyspoiesis was not present and there was no evidence of metastatic carcinoma. Cytogenetic analysis showed the presence of normal male chromosomes with no observable clonal chromosomal abnormalities.  probable cirrhosis 16. Genetic testing-negative genetic panel in March 2014 17. 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 Severe over the face-steroid Dosepak prescribed 07/03/2015. Improved 07/10/2015. Further improved 07/24/2015, 08/07/2015, 08/28/2015 18. Diarrhea secondary to chemotherapy-encouraged to use Imodium 07/03/2015 19. Paronychia secondary to [facility]   Disposition:  Jacob [name] continues to have rectal urgency. This limits his activities. He saw Dr. Ardis Hughs. He has not gone back to the pelvic physical  therapist, but has resumed pelvic exercises.  There is no clinical evidence of tumor progression. We discussed the timing of resuming chemotherapy. He would like to continue observation for now. He will return for an office visit and  Port-A-Cath flush in 6 weeks. We will plan for a restaging chest CT in March.  Betsy Coder, MD  03/31/2016  10:12 AM

## 2016-04-01 LAB — CEA: CEA: 4 ng/mL (ref 0.0–4.7)

## 2016-05-12 ENCOUNTER — Other Ambulatory Visit (HOSPITAL_BASED_OUTPATIENT_CLINIC_OR_DEPARTMENT_OTHER): Payer: BC Managed Care – PPO

## 2016-05-12 ENCOUNTER — Ambulatory Visit: Payer: BC Managed Care – PPO

## 2016-05-12 ENCOUNTER — Ambulatory Visit (HOSPITAL_BASED_OUTPATIENT_CLINIC_OR_DEPARTMENT_OTHER): Payer: BC Managed Care – PPO | Admitting: Nurse Practitioner

## 2016-05-12 VITALS — BP 121/85 | HR 63 | Temp 98.5°F | Resp 18 | Ht 70.0 in | Wt 149.2 lb

## 2016-05-12 DIAGNOSIS — C787 Secondary malignant neoplasm of liver and intrahepatic bile duct: Secondary | ICD-10-CM | POA: Diagnosis not present

## 2016-05-12 DIAGNOSIS — Z95828 Presence of other vascular implants and grafts: Secondary | ICD-10-CM

## 2016-05-12 DIAGNOSIS — R109 Unspecified abdominal pain: Secondary | ICD-10-CM

## 2016-05-12 DIAGNOSIS — C20 Malignant neoplasm of rectum: Secondary | ICD-10-CM

## 2016-05-12 DIAGNOSIS — R194 Change in bowel habit: Secondary | ICD-10-CM | POA: Diagnosis not present

## 2016-05-12 LAB — CEA (IN HOUSE-CHCC): CEA (CHCC-In House): 12 ng/mL — ABNORMAL HIGH (ref 0.00–5.00)

## 2016-05-12 MED ORDER — SODIUM CHLORIDE 0.9 % IJ SOLN
10.0000 mL | INTRAMUSCULAR | Status: DC | PRN
  Administered 2016-05-12: 10 mL via INTRAVENOUS
  Filled 2016-05-12: qty 10

## 2016-05-12 MED ORDER — HEPARIN SOD (PORK) LOCK FLUSH 100 UNIT/ML IV SOLN
500.0000 [IU] | Freq: Once | INTRAVENOUS | Status: AC | PRN
  Administered 2016-05-12: 500 [IU] via INTRAVENOUS
  Filled 2016-05-12: qty 5

## 2016-05-12 NOTE — Progress Notes (Signed)
[name] OFFICE PROGRESS NOTE   Diagnosis:  Rectal cancer  INTERVAL HISTORY:   Mr. Sibal returns as scheduled. Bowel habits continue to be erratic. He has occasional abdominal pain. No nausea or vomiting. Appetite described as "ok". No shortness of breath.   Objective:  Vital signs in last 24 hours:  Blood pressure 121/85, pulse 63, temperature 98.5 F (36.9 C), temperature source Oral, resp. rate 18, height '5\' 10"'  (1.778 m), weight 149 lb 3.2 oz (67.7 kg), SpO2 100 %.    HEENT: No thrush or ulcers. Lymphatics: No palpable cervical, supraclavicular or axillary lymph nodes.  Resp: Lungs clear bilaterally. Cardio: Regular rate and rhythm. GI: Abdomen soft and nontender. No hepatomegaly. No mass. Vascular: No leg edema.  Portacath without erythema.   Lab Results:  Lab Results  Component Value Date   WBC 2.5 (L) 03/31/2016   HGB 13.2 03/31/2016   HCT 39.8 03/31/2016   MCV 89.4 03/31/2016   PLT 105 (L) 03/31/2016   NEUTROABS 1.7 03/31/2016    Imaging:  No results found.  Medications: I have reviewed the patient's current medications.  Assessment/Plan: 1. Rectal cancer. Partially obstructing mass noted 1-2 cm from the anal verge on a colonoscopy 03/06/2012. Endoscopic ultrasound 03/14/2012 with a 7.5 mm thick, 3.2 cm wide hypoechoic, irregularly bordered mass that clearly passed into and through the muscularis propria layer of the distal rectal wall (uT3); 3 small (largest 7 mm) perirectal lymph nodes. The lymph nodes were all round, discrete, hypoechoic, homogenous; suspicious for malignant involvement (uN1).  No RAS mutation identified by [facility] 1 testing on the colon resection specimen 06/29/2012, APC alteration identified, microsatellite stable  He began radiation and concurrent Xeloda chemotherapy on 03/20/2012, completed 04/27/2012.   Low anterior resection/coloanal anastomosis and diverting ileostomy 06/29/2012 with the final pathology  revealing a T2N0 tumor with extensive fibrosis and negative margins.   Cycle 1 of adjuvant CAPOX 07/19/2012. Cycle 5 of adjuvant CAPOX 10/11/2012.   CEA 2.5 on 11/14/2012.   CEA 14.6 03/08/2013.   Restaging CT evaluation 03/08/2013 with a 4 mm pulmonary nodule left lung base not identified on comparison exam; new liver lesions including a 29 x 26 mm irregular peripheral enhancing rounded lesion in the dome of the right hepatic lobe, a less well-defined new subcapsular lesion in the lateral right hepatic lobe measuring 12 mm, a new subcapsular lesion in the anterior right hepatic lobe adjacent to the gallbladder fossa measuring 10 mm; a rounded low-density lesion in the inferior right hepatic lobe measuring 10 mm compared to 7 mm on the prior study (radiologist commented this may represent an enlarged cyst).   MRI of the abdomen 04/12/2013 confirmed multiple T2 hyperintense metastatic lesions throughout the right liver   Initiation of FOLFIRI/Avastin with genotype based irinotecan dosing per the [facility] study 04/05/2013.   Restaging CT evaluation on 06/20/2013 (after 2 cycles/4 treatments) showed improvement in the liver metastases and stable size of a left lower lobe pulmonary nodule now with central cavitation.   Continuation of FOLFIRI/Avastin.   Restaging CT evaluation 08/14/2013-decrease in the size of liver metastases, slight decrease in the size of a cavitary left lower lobe nodule, no evidence of disease progression.   Status post right hepatic lobectomy 09/17/2013. Pathology showed multiple foci of metastatic adenocarcinoma (5 nodules of metastatic adenocarcinoma 4 of which are subcapsular with the nodules ranging in size from 0.6-1.8 cm in greatest dimension). Margins not involved. Biopsy of a portal lymph node showed benign adipose tissue; no lymph node  tissue or malignancy.   Normal CEA 11/06/2013   CT 11/06/2013 with a new pleural-based right lower chest lesion, slight in  enlargement of the a left-sided lung nodule  CT 01/29/2014 with a decrease in the right lower chest pleural-based lesion and a slight increase of a left-sided lung lesion, other lung lesions were stable  CT chest 05/02/2014 with a decrease in the size of a right lower lobe pulmonary nodule months similar size of a dominant left lower lobe nodule, minimal enlargement of a smaller left lower lobe nodule, no new site of disease  CT chest 11/07/2014 with a slight increase in several left-sided lung nodules  CT chest 02/10/2015 with new small left hilar lymph nodes, a possible new left lower lobe nodule, and stability of other lung nodules  PET scan 03/12/2015 with hypermetabolic left hilar nodes, hypermetabolic left lower lobe nodules, hyper metabolic retroperitoneal nodes, intense hypermetabolism at the coloanal anastomosis, and hypermetabolic thickening in the presacral space  Cycle 1 FOLFIRI/PANITUMUMAB 05/21/2015  Cycle 2 FOLFIRI/PANITUMUMAB 06/05/2015  Cycle 3 FOLFIRI/PANITUMUMAB 06/19/2015  Cycle 4 FOLFIRI 07/10/2015  Cycle 5 FOLFIRI 07/24/2015  Restaging CTs 08/06/2015-resolution of hilar/retroperitoneal adenopathy, improvement in the hypermetabolic lung nodule, other lung nodules are stable, no new lesions  Cycle 6 FOLFIRI with PANITUMUMAB 08/28/2015 -panitumumab dose reduced  Cycle 7 FOLFIRI 09/11/2015-no panitumumab given  Cycle 8 FOLFIRI with PANITUMUMAB 09/25/2015-PANITUMUMAB dose reduced  Cycle 9 FOLFIRI 10/09/2015-no PANITUMUMAB given  Cycle 10 FOLFIRI with panitumumab 10/23/2015  CTs 11/06/2015-possible slight enlargement of a left lower lobe nodule, no other evidence of disease progression.  CTs 02/16/2016-enlargement of left-sided pulmonary nodules and mediastinal/left hilar nodes, improved splenomegaly 2. Irregular bowel habits/rectal bleeding secondary to #1. 3. History of Mild elevation of the liver enzymes. Question secondary to hepatic  steatosis. 4. Indeterminate 8 mm posterior right liver lesion on the staging CT 03/06/2012. 5. Mildly elevated CEA at 5.7 on 03/06/2012. 6. History of radiation erythema at the groin and perineum 7. 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 No clinical evidence of thrombophlebitis or extravasation. 8. Delayed nausea following chemotherapy-Decadron prophylaxis was added with cycle 3 CAPOX-improved. 9. Ileostomy takedown 12/07/2012. 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 11. Port-A-Cath placement 12/31/204 12. Severe neutropenia secondary to chemotherapy following cycle 1 of FOLFIRI, chemotherapy was dose reduced and he received Neulasta with day 15 cycle 1  13. Nausea and vomiting following cycle 1 of FOLFIRI 14. Rectal stricture-manual/colonoscopic dilatation by Dr. Ardis Hughs 03/01/2016 15. Leukopenia/Thrombocytopenia-persistent, potentially a sequelae of chemotherapy or hepatic toxicity from chemotherapy/radiation. Bone marrow biopsy 11/20/2014 showed cellular bone marrow with trilineage hematopoiesis. Significant dyspoiesis was not present and there was no evidence of metastatic carcinoma. Cytogenetic analysis showed the presence of normal male chromosomes with no observable clonal chromosomal abnormalities.  probable cirrhosis 16. Genetic testing-negative genetic panel in March 2014 17. 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 Severe over the face-steroid Dosepak prescribed 07/03/2015. Improved 07/10/2015. Further improved 07/24/2015, 08/07/2015, 08/28/2015 18. Diarrhea secondary to chemotherapy-encouraged to use Imodium 07/03/2015 19. Paronychia secondary to [facility]   Disposition: Jacob [name] appears unchanged. There is no clinical evidence of disease progression. The plan is continue observation and proceed with restaging CT scans in 5-6 weeks. He will return for a follow-up visit a few days after the  CTs to review the results.     Ned Card ANP/GNP-BC   05/12/2016  12:07 PM

## 2016-05-14 ENCOUNTER — Encounter: Payer: Self-pay | Admitting: Gastroenterology

## 2016-05-14 ENCOUNTER — Ambulatory Visit (INDEPENDENT_AMBULATORY_CARE_PROVIDER_SITE_OTHER): Payer: BC Managed Care – PPO | Admitting: Gastroenterology

## 2016-05-14 VITALS — BP 110/68 | HR 84 | Ht 70.0 in | Wt 150.1 lb

## 2016-05-14 DIAGNOSIS — K624 Stenosis of anus and rectum: Secondary | ICD-10-CM | POA: Diagnosis not present

## 2016-05-14 NOTE — Patient Instructions (Addendum)
We will help get you back in to see Dr. Leighton Ruff about your anal stricture.  You have been scheduled for an appointment with Dr Marcello Moores at [facility]. Your appointment is on 05/18/16 at 940 am. Please arrive at 920 am for registration. Make certain to bring a list of current medications, including any over the counter medications or vitamins. Also bring your co-pay if you have one as well as your insurance cards. [facility] is located at [address], [address].   Should you need to reschedule your appointment, please contact them at [phone].

## 2016-05-14 NOTE — Progress Notes (Signed)
Review of pertinent gastrointestinal problems: 1. Metastatic rectal cancer (lung, liver).  Originally diagnosed Dr. Ardis Hughs colonoscopy 2013 [facility]).  Neoadjuvant chemo/xrt and then LAR 06/2012. Ileostomy takedown 9/14.  Right hepatectomy for cancer mets 08/2013 (cholecystectomy at same time). PET 02/2015 PET showed spread to lung, retroperitoneum, coloanal anastomosis.  2. 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 Flex sig Dr. Ardis Hughs 02/2016: biopsy of the stricture shows no cancer.  He was digitally dilated, referred to physical therapy for further help with the anal stricture.   HPI: This is a  very pleasant 46 year old man whom I last saw about 3 months ago  Chief complaint is anal stricture, difficulty with his movements   He is finding it difficult to dilate the anal stricture because his bowels and so erratic and out of control. He thinks he will dilated probably 3 times a week. At first he was using dilators but now he is using just his fingers. It hurts, it can bleed, he gets stool all over himself and so he is just miserable with the dilations. He has not been in to see the physical therapist about help with it either.  He was reluctant for me to examine him today  ROS: complete GI ROS as described in HPI.  Constitutional:  No unintentional weight loss   Past Medical History:  Diagnosis Date  . Allergic state 01/19/2012  . Anemia 12/31/2012  . Anxiety   . Cancer ([place]) 03/06/12   rectal bx=Adenocarcinoma  PT HAD RADIATION , CHEMO SURGERY  . Chicken pox as a child   ?  Marland Kitchen Dysrhythmia as child   HX OF IRREGULAR HB AT TIME OF TONSILLECTOMY - SURGERY WAS NOT DONE-CAN'T REMEMBER ANY OTHER DETAILS- NEVER HAD THE SURGERY.  . Elevated liver function tests 01/19/2012  . Fatty liver   . Hernia 6 months old  . History of diarrhea    better since chemo finished  . Hx of migraines   . Lung abnormality 2015  . Lung nodule 08/2013  . Lung nodule, multiple  11/29/2013  . Migraine headache as a child  . Numbness    TOES OF BOTH FEET.  Marland Kitchen Overweight(278.02) 01/19/2012  . Pain    LEFT HEEL PAIN -SEVERE ESPECIALLY AFTER SITTING OR LYING DOWN - DIFFICULT TO GET OUT OF BED AND WALK IN THE MORNINGS BECAUSE OF HEEL PAIN  . 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 Preventative health care 01/19/2012  . Radiation 03/20/12-04/27/12   Pelvis 50.4 gray Rectal cancer  . Rectal cancer ([place]) 03/06/12   biopsy-adenocarcinoma  . Reflux 01/19/2012  . Sun-damaged skin 01/19/2012    Past Surgical History:  Procedure Laterality Date  . CHOLECYSTECTOMY N/A 09/17/2013   Procedure: CHOLECYSTECTOMY;  Surgeon: Stark Klein, MD;  Location: [facility];  Service: General;  Laterality: N/A;  . CYST EXCISION     L EAR AREA  . EUS  03/14/2012   Procedure: LOWER ENDOSCOPIC ULTRASOUND (EUS);  Surgeon: Milus Banister, MD;  Location: [facility];  Service: Endoscopy;  Laterality: N/A;  . HERNIA REPAIR  6 months old   right inguinal repair  . ILEOSTOMY CLOSURE N/A 12/07/2012   Procedure: ILEOSTOMY TAKEDOWN;  Surgeon: Leighton Ruff, MD;  Location: [facility];  Service: General;  Laterality: N/A;  . LAPAROSCOPIC LOW ANTERIOR RESECTION N/A 06/29/2012   Procedure: LAPAROSCOPIC LOW ANTERIOR RESECTION, mobilization splenic flexure,coloanal anastomosis,diverting ileostomy;  Surgeon: Leighton Ruff, MD;  Location: [facility];  Service: General;  Laterality: N/A;  . LAPAROSCOPY N/A 09/17/2013   Procedure: LAPAROSCOPY DIAGNOSTIC;  Surgeon: Stark Klein, MD;  Location: [facility];  Service: General;  Laterality: N/A;  . LIVER ULTRASOUND N/A 09/17/2013   Procedure: LIVER ULTRASOUND;  Surgeon: Stark Klein, MD;  Location: [place];  Service: General;  Laterality: N/A;  . OPEN HEPATECTOMY  N/A 09/17/2013   Procedure: OPEN HEPATECTOMY;  Surgeon: Stark Klein, MD;  Location: [place];  Service: General;  Laterality: N/A;  . PORTACATH PLACEMENT Left 03/21/2013   Procedure:  INSERTION PORT-A-CATH;  Surgeon: Leighton Ruff, MD;  Location: [facility];  Service: General;  Laterality: Left;  . RECTAL BIOPSY  03/06/12   distal mass1-2cm from anal verge=Adenocarcinoma    Current Outpatient Prescriptions  Medication Sig Dispense Refill  . docusate sodium (COLACE) 100 MG capsule Take 100 mg by mouth daily as needed for mild constipation or moderate constipation. Occasionally bid    . lidocaine-prilocaine (EMLA) cream Apply 1 application topically as needed. Apply to port site 1 hour prior to use. 30 g 3  . naproxen sodium (ALEVE) 220 MG tablet Take 220 mg by mouth daily as needed (for headache). Reported on 08/28/2015    . Probiotic Product (SOLUBLE FIBER/PROBIOTICS PO) Take by mouth.     No current facility-administered medications for this visit.    Facility-Administered Medications Ordered in Other Visits  Medication Dose Route Frequency Provider Last Rate Last Dose  . atropine injection 0.5 mg  0.5 mg Intravenous Once PRN Ladell Pier, MD        Allergies as of 05/14/2016 - Review Complete 05/14/2016  Allergen Reaction Noted  . Penicillins Rash 01/19/2012    Family History  Problem Relation Age of Onset  . Adopted: Yes  . Cancer Father   . Heart disease Father   . Colon cancer Neg Hx     Social History   Social History  . Marital status: Married    Spouse name: N/A  . Number of children: 1  . Years of education: N/A   Occupational History  . Scultor/Professor [employer]   Social History Main Topics  . Smoking status: Never Smoker  . Smokeless tobacco: Never Used  . Alcohol use 0.0 oz/week     Comment: RARE   . Drug use: No     Comment: marijuana  . Sexual activity: Yes    Partners: Female   Other Topics Concern  . Not on file   Social History Narrative   Married to wife, [name]   Has one 58 year old child   Occupation: Emergency planning/management officer at [employer]     Physical Exam: BP 110/68   Pulse 84   Ht '5\' 10"'$  (1.778 m)   Wt 150 lb 2  oz (68.1 kg)   BMI 21.54 kg/m  Constitutional: generally well-appearing Psychiatric: alert and oriented x3 Abdomen: soft, nontender, nondistended, no obvious ascites, no peritoneal signs, normal bowel sounds No peripheral edema noted in lower extremities He preferred I not examine him today for he was afraid he would defecate, pain  Assessment and plan: 46 y.o. male with anal stricture  He has metastatic rectal cancer, that is the big picture here. What is making him most miserable however is his anal stricture. He is debilitated by this. He would not let me examine him today for fear of spilling stool, significant pain. 3 months ago I dilated him manually during colonoscopy the colonoscope also perform some dilation. He has  not been in to see the physical therapist as I had recommended for fear of pain, embarrassment.  He is really in a tough spot and I explained his options are getting serious about the dilations and trying to overcome his concerns. He would probably need to dilate this manually once or twice a day at least, and visiting with therapist would be helpful.  Alternatively he could consider fecal diversion (ostomy).  I recommended he see Dr. Marcello Moores. She may be able to offer EUA, dilation or perhaps fecal diversion.  He is somewhat interested in colostomy so that he doesn't have to deal with the stricture anymore.  Please see the "Patient Instructions" section for addition details about the plan.  Owens Loffler, MD [facility] 05/14/2016, 9:08 AM

## 2016-05-25 ENCOUNTER — Telehealth: Payer: Self-pay | Admitting: Oncology

## 2016-05-25 NOTE — Telephone Encounter (Signed)
Called and left a message about the appointment on 3/26 and 3/28 and left the number to call if there was any questions

## 2016-06-02 ENCOUNTER — Telehealth: Payer: Self-pay | Admitting: Oncology

## 2016-06-02 NOTE — Telephone Encounter (Signed)
Left message regarding appt r/s  Due to conflicting appts

## 2016-06-10 ENCOUNTER — Telehealth: Payer: Self-pay | Admitting: *Deleted

## 2016-06-10 ENCOUNTER — Telehealth: Payer: Self-pay | Admitting: Emergency Medicine

## 2016-06-10 NOTE — Telephone Encounter (Signed)
Patient called requesting a letter to excuse him from [employer]; patient states that he will be here on 4/2 for lab and flush and that he can pick that up then. States jury duty date is rescheduled for 06/23/16.

## 2016-06-10 NOTE — Telephone Encounter (Signed)
Left message with patient to inform him to bring in jury duty letter for Dr. Benay Spice.  Instructed pt to call [place] back with any questions.

## 2016-06-11 ENCOUNTER — Other Ambulatory Visit: Payer: Self-pay | Admitting: Nurse Practitioner

## 2016-06-14 ENCOUNTER — Other Ambulatory Visit: Payer: BC Managed Care – PPO

## 2016-06-14 NOTE — Telephone Encounter (Signed)
Jury duty letter left at front desk for pick up. Attempted to notify pt, voicemail full.

## 2016-06-16 ENCOUNTER — Ambulatory Visit: Payer: BC Managed Care – PPO | Admitting: Oncology

## 2016-06-21 ENCOUNTER — Ambulatory Visit (HOSPITAL_COMMUNITY)
Admission: RE | Admit: 2016-06-21 | Discharge: 2016-06-21 | Disposition: A | Discharge status: Home / Self Care | Payer: BC Managed Care – PPO | Source: Ambulatory Visit | Attending: Nurse Practitioner | Admitting: Nurse Practitioner

## 2016-06-21 ENCOUNTER — Ambulatory Visit: Payer: BC Managed Care – PPO | Admitting: Oncology

## 2016-06-21 ENCOUNTER — Other Ambulatory Visit (HOSPITAL_BASED_OUTPATIENT_CLINIC_OR_DEPARTMENT_OTHER): Payer: BC Managed Care – PPO

## 2016-06-21 ENCOUNTER — Encounter (HOSPITAL_COMMUNITY): Payer: Self-pay

## 2016-06-21 ENCOUNTER — Ambulatory Visit: Payer: BC Managed Care – PPO

## 2016-06-21 DIAGNOSIS — Z95828 Presence of other vascular implants and grafts: Secondary | ICD-10-CM

## 2016-06-21 DIAGNOSIS — C20 Malignant neoplasm of rectum: Secondary | ICD-10-CM

## 2016-06-21 DIAGNOSIS — C787 Secondary malignant neoplasm of liver and intrahepatic bile duct: Secondary | ICD-10-CM | POA: Diagnosis not present

## 2016-06-21 DIAGNOSIS — R932 Abnormal findings on diagnostic imaging of liver and biliary tract: Secondary | ICD-10-CM | POA: Insufficient documentation

## 2016-06-21 DIAGNOSIS — R590 Localized enlarged lymph nodes: Secondary | ICD-10-CM | POA: Insufficient documentation

## 2016-06-21 DIAGNOSIS — K6389 Other specified diseases of intestine: Secondary | ICD-10-CM | POA: Insufficient documentation

## 2016-06-21 DIAGNOSIS — C7800 Secondary malignant neoplasm of unspecified lung: Secondary | ICD-10-CM | POA: Insufficient documentation

## 2016-06-21 LAB — CBC WITH DIFFERENTIAL/PLATELET
BASO%: 0.6 % (ref 0.0–2.0)
Basophils Absolute: 0 10*3/uL (ref 0.0–0.1)
EOS ABS: 0 10*3/uL (ref 0.0–0.5)
EOS%: 1.4 % (ref 0.0–7.0)
HCT: 38.5 % (ref 38.4–49.9)
HGB: 12.9 g/dL — ABNORMAL LOW (ref 13.0–17.1)
LYMPH%: 24.5 % (ref 14.0–49.0)
MCH: 29.6 pg (ref 27.2–33.4)
MCHC: 33.4 g/dL (ref 32.0–36.0)
MCV: 88.5 fL (ref 79.3–98.0)
MONO#: 0.3 10*3/uL (ref 0.1–0.9)
MONO%: 11.6 % (ref 0.0–14.0)
NEUT#: 1.8 10*3/uL (ref 1.5–6.5)
NEUT%: 61.9 % (ref 39.0–75.0)
PLATELETS: 124 10*3/uL — AB (ref 140–400)
RBC: 4.35 10*6/uL (ref 4.20–5.82)
RDW: 13.2 % (ref 11.0–14.6)
WBC: 2.9 10*3/uL — AB (ref 4.0–10.3)
lymph#: 0.7 10*3/uL — ABNORMAL LOW (ref 0.9–3.3)

## 2016-06-21 LAB — COMPREHENSIVE METABOLIC PANEL
ALT: 14 U/L (ref 0–55)
ANION GAP: 8 meq/L (ref 3–11)
AST: 18 U/L (ref 5–34)
Albumin: 4.1 g/dL (ref 3.5–5.0)
Alkaline Phosphatase: 60 U/L (ref 40–150)
BILIRUBIN TOTAL: 0.38 mg/dL (ref 0.20–1.20)
BUN: 15.7 mg/dL (ref 7.0–26.0)
CO2: 29 meq/L (ref 22–29)
Calcium: 9.7 mg/dL (ref 8.4–10.4)
Chloride: 103 mEq/L (ref 98–109)
Creatinine: 0.9 mg/dL (ref 0.7–1.3)
GLUCOSE: 86 mg/dL (ref 70–140)
POTASSIUM: 4.1 meq/L (ref 3.5–5.1)
SODIUM: 140 meq/L (ref 136–145)
TOTAL PROTEIN: 6.9 g/dL (ref 6.4–8.3)

## 2016-06-21 MED ORDER — IOPAMIDOL (ISOVUE-370) INJECTION 76%
INTRAVENOUS | Status: AC
  Filled 2016-06-21: qty 100

## 2016-06-21 MED ORDER — HEPARIN SOD (PORK) LOCK FLUSH 100 UNIT/ML IV SOLN: 500.0000 [IU] | Freq: Once | INTRAVENOUS | Status: DC

## 2016-06-21 MED ORDER — HEPARIN SOD (PORK) LOCK FLUSH 100 UNIT/ML IV SOLN
INTRAVENOUS | Status: DC
Start: 2016-06-21 — End: 2016-06-22
  Filled 2016-06-21: qty 5

## 2016-06-21 MED ORDER — IOPAMIDOL (ISOVUE-300) INJECTION 61%
100.0000 mL | Freq: Once | INTRAVENOUS | Status: AC | PRN
  Administered 2016-06-21: 100 mL via INTRAVENOUS

## 2016-06-21 MED ORDER — SODIUM CHLORIDE 0.9 % IJ SOLN
10.0000 mL | INTRAMUSCULAR | Status: DC | PRN
  Administered 2016-06-21: 10 mL via INTRAVENOUS
  Filled 2016-06-21: qty 10

## 2016-06-21 NOTE — Patient Instructions (Signed)
Implanted Port Home Guide An implanted port is a type of central line that is placed under the skin. Central lines are used to provide IV access when treatment or nutrition needs to be given through a person's veins. Implanted ports are used for long-term IV access. An implanted port may be placed because:  You need IV medicine that would be irritating to the small veins in your hands or arms.  You need long-term IV medicines, such as antibiotics.  You need IV nutrition for a long period.  You need frequent blood draws for lab tests.  You need dialysis.  Implanted ports are usually placed in the chest area, but they can also be placed in the upper arm, the abdomen, or the leg. An implanted port has two main parts:  Reservoir. The reservoir is round and will appear as a small, raised area under your skin. The reservoir is the part where a needle is inserted to give medicines or draw blood.  Catheter. The catheter is a thin, flexible tube that extends from the reservoir. The catheter is placed into a large vein. Medicine that is inserted into the reservoir goes into the catheter and then into the vein.  How will I care for my incision site? Do not get the incision site wet. Bathe or shower as directed by your health care provider. How is my port accessed? Special steps must be taken to access the port:  Before the port is accessed, a numbing cream can be placed on the skin. This helps numb the skin over the port site.  Your health care provider uses a sterile technique to access the port. ? Your health care provider must put on a mask and sterile gloves. ? The skin over your port is cleaned carefully with an antiseptic and allowed to dry. ? The port is gently pinched between sterile gloves, and a needle is inserted into the port.  Only "non-coring" port needles should be used to access the port. Once the port is accessed, a blood return should be checked. This helps ensure that the port  is in the vein and is not clogged.  If your port needs to remain accessed for a constant infusion, a clear (transparent) bandage will be placed over the needle site. The bandage and needle will need to be changed every week, or as directed by your health care provider.  Keep the bandage covering the needle clean and dry. Do not get it wet. Follow your health care provider's instructions on how to take a shower or bath while the port is accessed.  If your port does not need to stay accessed, no bandage is needed over the port.  What is flushing? Flushing helps keep the port from getting clogged. Follow your health care provider's instructions on how and when to flush the port. Ports are usually flushed with saline solution or a medicine called heparin. The need for flushing will depend on how the port is used.  If the port is used for intermittent medicines or blood draws, the port will need to be flushed: ? After medicines have been given. ? After blood has been drawn. ? As part of routine maintenance.  If a constant infusion is running, the port may not need to be flushed.  How long will my port stay implanted? The port can stay in for as long as your health care provider thinks it is needed. When it is time for the port to come out, surgery will be   done to remove it. The procedure is similar to the one performed when the port was put in. When should I seek immediate medical care? When you have an implanted port, you should seek immediate medical care if:  You notice a bad smell coming from the incision site.  You have swelling, redness, or drainage at the incision site.  You have more swelling or pain at the port site or the surrounding area.  You have a fever that is not controlled with medicine.  This information is not intended to replace advice given to you by your health care provider. Make sure you discuss any questions you have with your health care provider. Document  Released: 03/08/2005 Document Revised: 08/14/2015 Document Reviewed: 11/13/2012 Elsevier Interactive Patient Education  2017 Elsevier Inc.  

## 2016-06-22 LAB — CEA (IN HOUSE-CHCC): CEA (CHCC-In House): 19.26 ng/mL — ABNORMAL HIGH (ref 0.00–5.00)

## 2016-06-24 ENCOUNTER — Telehealth: Payer: Self-pay | Admitting: Oncology

## 2016-06-24 ENCOUNTER — Ambulatory Visit (HOSPITAL_BASED_OUTPATIENT_CLINIC_OR_DEPARTMENT_OTHER): Payer: BC Managed Care – PPO | Admitting: Oncology

## 2016-06-24 VITALS — BP 133/73 | HR 57 | Temp 97.5°F | Resp 18 | Ht 70.0 in | Wt 148.8 lb

## 2016-06-24 DIAGNOSIS — R970 Elevated carcinoembryonic antigen [CEA]: Secondary | ICD-10-CM

## 2016-06-24 DIAGNOSIS — C787 Secondary malignant neoplasm of liver and intrahepatic bile duct: Secondary | ICD-10-CM

## 2016-06-24 DIAGNOSIS — C20 Malignant neoplasm of rectum: Secondary | ICD-10-CM

## 2016-06-24 DIAGNOSIS — R152 Fecal urgency: Secondary | ICD-10-CM | POA: Diagnosis not present

## 2016-06-24 DIAGNOSIS — R159 Full incontinence of feces: Secondary | ICD-10-CM | POA: Diagnosis not present

## 2016-06-24 NOTE — Telephone Encounter (Signed)
Gave patient AVS and calender per 06/24/2016 los. Referral notes given to Maudie Mercury in HIM to fax.Per Caryl Pina at [facility] will follow up and schedule appt -Patient has no showed and cancelled his las two appts there.

## 2016-06-24 NOTE — Progress Notes (Signed)
Jacob [name] OFFICE PROGRESS NOTE   Diagnosis: Rectal cancer  INTERVAL HISTORY:   Jacob [name] returns as scheduled. He continues to have difficulty with rectal urgency and frequency. He spends a large amount of time in the restroom. He is very frustrated by the rectal issues. He plans to schedule upon with Jacob [name] to discuss a diverting colostomy. He reports a poor appetite. No other specific complaint.  Objective:  Vital signs in last 24 hours:  Blood pressure 133/73, pulse (!) 57, temperature 97.5 F (36.4 C), temperature source Oral, resp. rate 18, height 5' 10" (1.778 m), weight 148 lb 12.8 oz (67.5 kg), SpO2 100 %.    HEENT: Neck without mass Lymphatics: No cervical, supraclavicular, or inguinal nodes. "Shotty "left axillary node. Resp: Lungs with scattered end inspiratory fine rales, no respiratory distress Cardio: Regular rate and rhythm GI: No hepatosplenomegaly, nontender Vascular: No leg edema  Skin: No rash   Portacath/PICC-without erythema  Lab Results:  Lab Results  Component Value Date   WBC 2.9 (L) 06/21/2016   HGB 12.9 (L) 06/21/2016   HCT 38.5 06/21/2016   MCV 88.5 06/21/2016   PLT 124 (L) 06/21/2016   NEUTROABS 1.8 06/21/2016  CEA-19.26  Imaging:  Ct Chest W Contrast  Result Date: 06/21/2016 CLINICAL DATA:  Patient with history of metastatic rectal carcinoma. Restaging evaluation. EXAM: CT CHEST, ABDOMEN, AND PELVIS WITH CONTRAST TECHNIQUE: Multidetector CT imaging of the chest, abdomen and pelvis was performed following the standard protocol during bolus administration of intravenous contrast. CONTRAST:  100mL ISOVUE-300 IOPAMIDOL (ISOVUE-300) INJECTION 61% COMPARISON:  CT CAP 02/16/2016 FINDINGS: CT CHEST FINDINGS Cardiovascular: Normal heart size. No pericardial effusion. Aorta main pulmonary artery normal in caliber. Mediastinum/Nodes: Interval increase in mediastinal and left hilar adenopathy. Reference prevascular lymph node measures  2.6 x 2.3 cm (image 20; series 2), previously 2.6 x 1.5 cm. Left hilar lymph node measures 2.1 x 2.2 cm (image 23; series 2), previously 1.0 x 0.8 cm. Lungs/Pleura: Central airways are patent. Interval increase in clustered nodules within the left lower lobe with a reference nodule measuring 6 mm (image 89; series 4), previously 2 mm. Interval increase in size of 11 x 9 mm left lower lobe nodule (image 83; series 4), previously 11 x 8 mm. Unchanged 9 x 4 mm nodule right lower lobe (image 105; series 4). No pleural effusion or pneumothorax. Musculoskeletal: No aggressive or acute appearing osseous lesions. CT ABDOMEN PELVIS FINDINGS Hepatobiliary: Liver is normal in size and contour. Pancreas: Unremarkable Spleen: Unremarkable Adrenals/Urinary Tract: Normal adrenal glands. Kidneys enhance symmetrically with contrast. Urinary bladder is decompressed. Stomach/Bowel: Large amount of stool within the cecum, ascending and transverse colon. The colon is mildly gaseous distended. Small bowel is normal in appearance. Normal morphology of the stomach. Similar appearance of mild rectal wall thickening. Vascular/Lymphatic: Normal caliber abdominal aorta. Porta hepatic soft tissue measuring 7.5 x 3.4 cm (image 50; series 2), previously 3.4 x 2.1 cm. Reproductive: Central dystrophic calcifications in the prostate. Other: Stable appearance presacral soft tissue thickening. Musculoskeletal: No aggressive or acute appearing osseous lesions. Bilateral AVN femoral heads. IMPRESSION: Interval increase in size of mediastinal and left hilar adenopathy. Interval increase in size of soft tissue within the porta hepatis most compatible with metastatic disease. This is favored to represent enlarging porta hepatic adenopathy which appears to impress upon the adjacent liver. The possibility of a metastatic hepatic lesion which has grown in size is an additional consideration. Interval increase in size of left-sided pulmonary metastatic    lesions. There is mild gaseous distention of the colon with a large amount of associated stool as can be seen with constipation. Electronically Signed   By: Lovey Newcomer M.D.   On: 06/21/2016 17:38   Ct Abdomen Pelvis W Contrast  Result Date: 06/21/2016 CLINICAL DATA:  Patient with history of metastatic rectal carcinoma. Restaging evaluation. EXAM: CT CHEST, ABDOMEN, AND PELVIS WITH CONTRAST TECHNIQUE: Multidetector CT imaging of the chest, abdomen and pelvis was performed following the standard protocol during bolus administration of intravenous contrast. CONTRAST:  182m ISOVUE-300 IOPAMIDOL (ISOVUE-300) INJECTION 61% COMPARISON:  CT CAP 02/16/2016 FINDINGS: CT CHEST FINDINGS Cardiovascular: Normal heart size. No pericardial effusion. Aorta main pulmonary artery normal in caliber. Mediastinum/Nodes: Interval increase in mediastinal and left hilar adenopathy. Reference prevascular lymph node measures 2.6 x 2.3 cm (image 20; series 2), previously 2.6 x 1.5 cm. Left hilar lymph node measures 2.1 x 2.2 cm (image 23; series 2), previously 1.0 x 0.8 cm. Lungs/Pleura: Central airways are patent. Interval increase in clustered nodules within the left lower lobe with a reference nodule measuring 6 mm (image 89; series 4), previously 2 mm. Interval increase in size of 11 x 9 mm left lower lobe nodule (image 83; series 4), previously 11 x 8 mm. Unchanged 9 x 4 mm nodule right lower lobe (image 105; series 4). No pleural effusion or pneumothorax. Musculoskeletal: No aggressive or acute appearing osseous lesions. CT ABDOMEN PELVIS FINDINGS Hepatobiliary: Liver is normal in size and contour. Pancreas: Unremarkable Spleen: Unremarkable Adrenals/Urinary Tract: Normal adrenal glands. Kidneys enhance symmetrically with contrast. Urinary bladder is decompressed. Stomach/Bowel: Large amount of stool within the cecum, ascending and transverse colon. The colon is mildly gaseous distended. Small bowel is normal in appearance. Normal  morphology of the stomach. Similar appearance of mild rectal wall thickening. Vascular/Lymphatic: Normal caliber abdominal aorta. Porta hepatic soft tissue measuring 7.5 x 3.4 cm (image 50; series 2), previously 3.4 x 2.1 cm. Reproductive: Central dystrophic calcifications in the prostate. Other: Stable appearance presacral soft tissue thickening. Musculoskeletal: No aggressive or acute appearing osseous lesions. Bilateral AVN femoral heads. IMPRESSION: Interval increase in size of mediastinal and left hilar adenopathy. Interval increase in size of soft tissue within the porta hepatis most compatible with metastatic disease. This is favored to represent enlarging porta hepatic adenopathy which appears to impress upon the adjacent liver. The possibility of a metastatic hepatic lesion which has grown in size is an additional consideration. Interval increase in size of left-sided pulmonary metastatic lesions. There is mild gaseous distention of the colon with a large amount of associated stool as can be seen with constipation. Electronically Signed   By: DLovey NewcomerM.D.   On: 06/21/2016 17:38   CT images reviewed Medications: I have reviewed the patient's current medications.  Assessment/Plan: 1. Rectal cancer. Partially obstructing mass noted 1-2 cm from the anal verge on a colonoscopy 03/06/2012. Endoscopic ultrasound 03/14/2012 with a 7.5 mm thick, 3.2 cm wide hypoechoic, irregularly bordered mass that clearly passed into and through the muscularis propria layer of the distal rectal wall (uT3); 3 small (largest 7 mm) perirectal lymph nodes. The lymph nodes were all round, discrete, hypoechoic, homogenous; suspicious for malignant involvement (uN1).  [facility] specimen 06/29/2012, APC alteration identified, microsatellite stable  He began radiation and concurrent Xeloda chemotherapy on 03/20/2012, completed 04/27/2012.   Low anterior  resection/coloanal anastomosis and diverting ileostomy 06/29/2012 with the final pathology revealing a T2N0 tumor with extensive  fibrosis and negative margins.   Cycle 1 of adjuvant CAPOX 07/19/2012. Cycle 5 of adjuvant CAPOX 10/11/2012.   CEA 2.5 on 11/14/2012.   CEA 14.6 03/08/2013.   Restaging CT evaluation 03/08/2013 with a 4 mm pulmonary nodule left lung base not identified on comparison exam; new liver lesions including a 29 x 26 mm irregular peripheral enhancing rounded lesion in the dome of the right hepatic lobe, a less well-defined new subcapsular lesion in the lateral right hepatic lobe measuring 12 mm, a new subcapsular lesion in the anterior right hepatic lobe adjacent to the gallbladder fossa measuring 10 mm; a rounded low-density lesion in the inferior right hepatic lobe measuring 10 mm compared to 7 mm on the prior study (radiologist commented this may represent an enlarged cyst).   MRI of the abdomen 04/12/2013 confirmed multiple T2 hyperintense metastatic lesions throughout the right liver   Initiation of FOLFIRI/Avastin with genotype based irinotecan dosing per the [facility] study 04/05/2013.   Restaging CT evaluation on 06/20/2013 (after 2 cycles/4 treatments) showed improvement in the liver metastases and stable size of a left lower lobe pulmonary nodule now with central cavitation.   Continuation of FOLFIRI/Avastin.   Restaging CT evaluation 08/14/2013-decrease in the size of liver metastases, slight decrease in the size of a cavitary left lower lobe nodule, no evidence of disease progression.   Status post right hepatic lobectomy 09/17/2013. Pathology showed multiple foci of metastatic adenocarcinoma (5 nodules of metastatic adenocarcinoma 4 of which are subcapsular with the nodules ranging in size from 0.6-1.8 cm in greatest dimension). Margins not involved. Biopsy of a portal lymph node showed benign adipose tissue; no lymph node tissue or malignancy.   Normal CEA  11/06/2013   CT 11/06/2013 with a new pleural-based right lower chest lesion, slight in enlargement of the a left-sided lung nodule  CT 01/29/2014 with a decrease in the right lower chest pleural-based lesion and a slight increase of a left-sided lung lesion, other lung lesions were stable  CT chest 05/02/2014 with a decrease in the size of a right lower lobe pulmonary nodule months similar size of a dominant left lower lobe nodule, minimal enlargement of a smaller left lower lobe nodule, no new site of disease  CT chest 11/07/2014 with a slight increase in several left-sided lung nodules  CT chest 02/10/2015 with new small left hilar lymph nodes, a possible new left lower lobe nodule, and stability of other lung nodules  PET scan 03/12/2015 with hypermetabolic left hilar nodes, hypermetabolic left lower lobe nodules, hyper metabolic retroperitoneal nodes, intense hypermetabolism at the coloanal anastomosis, and hypermetabolic thickening in the presacral space  Cycle 1 FOLFIRI/PANITUMUMAB 05/21/2015  Cycle 2 FOLFIRI/PANITUMUMAB 06/05/2015  Cycle 3 FOLFIRI/PANITUMUMAB 06/19/2015  Cycle 4 FOLFIRI 07/10/2015  Cycle 5 FOLFIRI 07/24/2015  Restaging CTs 08/06/2015-resolution of hilar/retroperitoneal adenopathy, improvement in the hypermetabolic lung nodule, other lung nodules are stable, no new lesions  Cycle 6 FOLFIRI with PANITUMUMAB 08/28/2015 -panitumumab dose reduced  Cycle 7 FOLFIRI 09/11/2015-no panitumumab given  Cycle 8 FOLFIRI with PANITUMUMAB 09/25/2015-PANITUMUMAB dose reduced  Cycle 9 FOLFIRI 10/09/2015-no PANITUMUMAB given  Cycle 10 FOLFIRI with panitumumab 10/23/2015  CTs 11/06/2015-possible slight enlargement of a left lower lobe nodule, no other evidence of disease progression.  CTs 02/16/2016-enlargement of left-sided pulmonary nodules and mediastinal/left hilar nodes, improved splenomegaly  CTs 06/21/2016-increased size of mediastinal/left hilar nodes, increased  left lung nodules, and increased soft tissue at the porta hepatis 2. Irregular bowel habits/rectal bleeding secondary to #1. 3. History of Mild elevation of   the liver enzymes. Question secondary to hepatic steatosis. 4. Indeterminate 8 mm posterior right liver lesion on the staging CT 03/06/2012. 5. Mildly elevated CEA at 5.7 on 03/06/2012. 6. History of radiation erythema at the groin and perineum 7. 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 No clinical evidence of thrombophlebitis or extravasation. 8. Delayed nausea following chemotherapy-Decadron prophylaxis was added with cycle 3 CAPOX-improved. 9. Ileostomy takedown 12/07/2012. 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 11. Port-A-Cath placement 12/31/204 12. Severe neutropenia secondary to chemotherapy following cycle 1 of FOLFIRI, chemotherapy was dose reduced and he received Neulasta with day 15 cycle 1  13. Nausea and vomiting following cycle 1 of FOLFIRI 14. Rectal stricture-manual/colonoscopic dilatation by Dr. Ardis Hughs 03/01/2016 15. Leukopenia/Thrombocytopenia-persistent, potentially a sequelae of chemotherapy or hepatic toxicity from chemotherapy/radiation. Bone marrow biopsy 11/20/2014 showed cellular bone marrow with trilineage hematopoiesis. Significant dyspoiesis was not present and there was no evidence of metastatic carcinoma. Cytogenetic analysis showed the presence of normal male chromosomes with no observable clonal chromosomal abnormalities.  probable cirrhosis 16. Genetic testing-negative genetic panel in March 2014 17. 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 Severe over the face-steroid Dosepak prescribed 07/03/2015. Improved 07/10/2015. Further improved 07/24/2015, 08/07/2015, 08/28/2015 18. Diarrhea secondary to chemotherapy-encouraged to use Imodium 07/03/2015 19. History of Paronychia secondary to PANITUMUMAB    Disposition:  Jacob [name]  remains asymptomatic from the rectal cancer. The CEA is higher and the restaging CT reveals disease progression. He continues to have a low tumor burden.  He remains symptomatic from the rectal stricture. He would like to address the stricture prior to considering additional systemic therapy. He will schedule a point with Jacob [name] to discuss treatment options including a diverting colostomy.  My recommendation is to resume FOLFIRI/panitumumab after the rectal urgency has been addressed.  He will return for an office visit and Port-A-Cath flush in 6 weeks.  25 minutes were spent with the patient. The majority of the time was used for counseling and coordination of care.  Betsy Coder, MD  06/24/2016  4:42 PM

## 2016-06-25 ENCOUNTER — Telehealth: Payer: Self-pay | Admitting: Oncology

## 2016-06-25 NOTE — Telephone Encounter (Signed)
Faxed records to Dr Marcello Moores

## 2016-06-28 ENCOUNTER — Telehealth: Payer: Self-pay | Admitting: Medical Oncology

## 2016-06-28 NOTE — Telephone Encounter (Signed)
appt with Dr Leighton Ruff is not until may 9th -wants sooner if anyone can move it up . Also constipated and some vomiting.Note to POD 2.

## 2016-06-29 ENCOUNTER — Telehealth: Payer: Self-pay | Admitting: *Deleted

## 2016-06-29 NOTE — Telephone Encounter (Signed)
Message from pt reporting Dr. Marcello Moores can't see him until 07/28/16 asking if Dr. Benay Spice can get him in sooner with surgeon. MD discussed with surgeon and May is her first available. Per MD: Will request appointment with another surgeon if pt prefers.

## 2016-06-29 NOTE — Telephone Encounter (Signed)
Message from pt: "I've thought about it and I really want Dr. Marcello Moores to do my surgery. I'll just wait." Will make Dr. Benay Spice aware.

## 2016-07-28 ENCOUNTER — Other Ambulatory Visit: Payer: Self-pay | Admitting: General Surgery

## 2016-07-28 MED ORDER — DEXTROSE 5 % IV SOLN: 5.0000 mg/kg | INTRAVENOUS | Status: DC

## 2016-07-28 MED ORDER — DEXTROSE 5 % IV SOLN: 900.0000 mg | INTRAVENOUS | Status: DC

## 2016-07-28 NOTE — Progress Notes (Unsigned)
History of Present Illness (Milano Rosevear MD; 07/28/2016 3:39 PM) The patient is a 45 year old male who presents with anal itching. Patient presents to the office for follow-up after rectal cancer and low anterior resection with coloanal anastomosis April 2014. He has had difficulty with his bowel habits and surgery. He complains of rectal urgency and frequency. He spends a large amount of his time in the restroom. He has a limited diet and has lost approximately 30 pounds due to inability to eat certain foods. He continues to have rectal bleeding as well. 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  Diagnostic Studies History (Brycelynn Stampley, MD; 07/28/2016 3:39 PM) Colonoscopy 1-5 years ago  Allergies (Rosa Rogers, RMA; 07/28/2016 2:19 PM) Penicillin G Benzathine & Proc *PENICILLINS* Rash. Allergies Reconciled  Medication History (Hawraa Stambaugh, MD; 07/28/2016 3:39 PM) Anusol-HC (25MG Suppository, 1 (one) suppository Rectal two times daily prn, Taken starting 02/22/2014) Active. ALPRAZolam (0.25MG Tablet, Oral) Active. Lidocaine-Prilocaine (2.5-2.5% Cream, External) Active. Spironolactone (25MG Tablet, Oral) Active. Medications Reconciled Neomycin Sulfate (500MG Tablet, 2 (two) Tablet Oral SEE NOTE, Taken starting 07/28/2016) Active. (TAKE TWO TABLETS AT 2 PM, 3 PM, AND 10 PM THE DAY PRIOR TO SURGERY) Flagyl (500MG Tablet, 2 (two) Tablet Oral SEE NOTE, Taken starting 07/28/2016) Active. (Take at 2pm, 3pm, and 10pm the day prior to your colon operation)  Social History (Jazmene Racz, MD; 07/28/2016 3:39 PM) Alcohol use Occasional alcohol use. Caffeine use Carbonated beverages, Coffee, Tea. Illicit drug use Prefer to discuss  with provider. Tobacco use Never smoker.  Family History (Gabreal Worton, MD; 07/28/2016 3:39 PM) Family history unknown First Degree Relatives  Other Problems (Azion Centrella, MD; 07/28/2016 3:39 PM) Anxiety Disorder Back Pain Gastroesophageal Reflux Disease Hemorrhoids Inguinal Hernia Migraine Headache Rectal Cancer     Review of Systems (Macen Joslin MD; 07/28/2016 3:39 PM) General Not Present- Appetite Loss, Chills, Fatigue, Fever, Night Sweats, Weight Gain and Weight Loss. Skin Not Present- Change in Wart/Mole, Dryness, Hives, Jaundice, New Lesions, Non-Healing Wounds, Rash and Ulcer. HEENT Present- Ringing in the Ears and Seasonal Allergies. Not Present- Earache, Hearing Loss, Hoarseness, Nose Bleed, Oral Ulcers, Sinus Pain, Sore Throat, Visual Disturbances, Wears glasses/contact lenses and Yellow Eyes. Respiratory Not Present- Bloody sputum, Chronic Cough, Difficulty Breathing, Snoring and Wheezing. Breast Not Present- Breast Mass, Breast Pain, Nipple Discharge and Skin Changes. Cardiovascular Not Present- Chest Pain, Difficulty Breathing Lying Down, Leg Cramps, Palpitations, Rapid Heart Rate, Shortness of Breath and Swelling of Extremities. Gastrointestinal Present- Abdominal Pain, Change in Bowel Habits, Constipation and Hemorrhoids. Not Present- Bloating, Bloody Stool, Chronic diarrhea, Difficulty Swallowing, Excessive gas, Gets full quickly at meals, Indigestion, Nausea, Rectal Pain and Vomiting. Male Genitourinary Not Present- Blood in Urine, Change in Urinary Stream, Frequency, Impotence, Nocturia, Painful Urination, Urgency and Urine Leakage. Musculoskeletal Not Present- Back Pain, Joint Pain, Joint Stiffness, Muscle Pain, Muscle Weakness and Swelling of Extremities. Neurological Present- Headaches and Numbness. Not Present- Decreased Memory, Fainting, Seizures, Tingling, Tremor, Trouble walking and Weakness. Psychiatric Not Present- Anxiety, Bipolar, Change in Sleep  Pattern, Depression, Fearful and Frequent crying. Endocrine Not Present- Cold Intolerance, Excessive Hunger, Hair Changes, Heat Intolerance and New Diabetes. Hematology Not Present- Easy Bruising, Excessive bleeding, Gland problems, HIV and Persistent Infections.  Vitals (Rosa Rogers RMA; 07/28/2016 2:21 PM) 07/28/2016 2:20   PM Weight: 148.2 lb Height: 70in Body Surface Area: 1.84 m Body Mass Index: 21.26 kg/m  Temp.: 98.1F  Pulse: 95 (Regular)  P.OX: 99% (Room air) BP: 120/80 (Sitting, Left Arm, Standard)      Physical Exam (Keziyah Kneale MD; 07/28/2016 3:42 PM)  General Mental Status-Alert. General Appearance-Not in acute distress. Build & Nutrition-Well nourished. Posture-Normal posture. Gait-Normal.  Head and Neck Head-normocephalic, atraumatic with no lesions or palpable masses. Trachea-midline.  Chest and Lung Exam Chest and lung exam reveals -on auscultation, normal breath sounds, no adventitious sounds and normal vocal resonance.  Cardiovascular Cardiovascular examination reveals -normal heart sounds, regular rate and rhythm with no murmurs and no digital clubbing, cyanosis, edema, increased warmth or tenderness.  Abdomen Inspection Inspection of the abdomen reveals - No Hernias. Palpation/Percussion Palpation and Percussion of the abdomen reveal - Soft, Non Tender, No Rigidity (guarding), No hepatosplenomegaly and No Palpable abdominal masses.  Rectal Anorectal Exam Internal - Note: stricture.  Neurologic Neurologic evaluation reveals -alert and oriented x 3 with no impairment of recent or remote memory, normal attention span and ability to concentrate, normal sensation and normal coordination.  Musculoskeletal Normal Exam - Bilateral-Upper Extremity Strength Normal and Lower Extremity Strength Normal.    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 He is developed an anal stricture. This is causing frequent defecation and decreased quality of life. He is losing weight due to inability to tolerate most foods. I think he will be much happier with a colostomy. If his pelvis is not too adherent, we will plan on removing his colon and anal canal as well by performing a completion APR. we discussed the possible need for open surgery. We discussed the possibility of damage to adjacent structures due to scar tissue. I think this is a reasonable chance of improving his quality of life.  The surgery and anatomy were described to the patient as well as the risks of surgery and the possible complications. These include: Bleeding, deep abdominal infections and possible wound complications such as hernia and infection, damage to adjacent structures, leak of surgical connections, which can lead to other surgeries and possibly an ostomy, possible need for other procedures, such as abscess drains in radiology, possible prolonged hospital stay, possible diarrhea from removal of part of the colon, possible constipation from narcotics, possible bowel, bladder or sexual dysfunction if having rectal surgery, prolonged fatigue/weakness or appetite loss, possible early recurrence of of disease, possible complications of their medical problems such as heart disease or arrhythmias or lung problems, death (less than 1%). I believe the patient understands and wishes to proceed with the surgery.  

## 2016-07-28 NOTE — H&P (Unsigned)
History of Present Illness (Jacob Reinheimer MD; 07/28/2016 3:39 PM) The patient is a 46 year old male who presents with anal itching. Patient presents to the office for follow-up after rectal cancer and low anterior resection with coloanal anastomosis April 2014. He has had difficulty with his bowel habits and surgery. He complains of rectal urgency and frequency. He spends a large amount of his time in the restroom. He has a limited diet and has lost approximately 30 pounds due to inability to eat certain foods. He continues to have rectal bleeding as well. 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  Diagnostic Studies History (Selda Jalbert, MD; 07/28/2016 3:39 PM) Colonoscopy 1-5 years ago  Allergies (Rosa Rogers, RMA; 07/28/2016 2:19 PM) Penicillin G Benzathine & Proc *PENICILLINS* Rash. Allergies Reconciled  Medication History (Betul Brisky, MD; 07/28/2016 3:39 PM) Anusol-HC (25MG Suppository, 1 (one) suppository Rectal two times daily prn, Taken starting 02/22/2014) Active. ALPRAZolam (0.25MG Tablet, Oral) Active. Lidocaine-Prilocaine (2.5-2.5% Cream, External) Active. Spironolactone (25MG Tablet, Oral) Active. Medications Reconciled Neomycin Sulfate (500MG Tablet, 2 (two) Tablet Oral SEE NOTE, Taken starting 07/28/2016) Active. (TAKE TWO TABLETS AT 2 PM, 3 PM, AND 10 PM THE DAY PRIOR TO SURGERY) Flagyl (500MG Tablet, 2 (two) Tablet Oral SEE NOTE, Taken starting 07/28/2016) Active. (Take at 2pm, 3pm, and 10pm the day prior to your colon operation)  Social History (Zigmond Trela, MD; 07/28/2016 3:39 PM) Alcohol use Occasional alcohol use. Caffeine use Carbonated beverages, Coffee, Tea. Illicit drug use Prefer to discuss  with provider. Tobacco use Never smoker.  Family History (Joneric Streight, MD; 07/28/2016 3:39 PM) Family history unknown First Degree Relatives  Other Problems (Caysie Minnifield, MD; 07/28/2016 3:39 PM) Anxiety Disorder Back Pain Gastroesophageal Reflux Disease Hemorrhoids Inguinal Hernia Migraine Headache Rectal Cancer     Review of Systems (Mayo Faulk MD; 07/28/2016 3:39 PM) General Not Present- Appetite Loss, Chills, Fatigue, Fever, Night Sweats, Weight Gain and Weight Loss. Skin Not Present- Change in Wart/Mole, Dryness, Hives, Jaundice, New Lesions, Non-Healing Wounds, Rash and Ulcer. HEENT Present- Ringing in the Ears and Seasonal Allergies. Not Present- Earache, Hearing Loss, Hoarseness, Nose Bleed, Oral Ulcers, Sinus Pain, Sore Throat, Visual Disturbances, Wears glasses/contact lenses and Yellow Eyes. Respiratory Not Present- Bloody sputum, Chronic Cough, Difficulty Breathing, Snoring and Wheezing. Breast Not Present- Breast Mass, Breast Pain, Nipple Discharge and Skin Changes. Cardiovascular Not Present- Chest Pain, Difficulty Breathing Lying Down, Leg Cramps, Palpitations, Rapid Heart Rate, Shortness of Breath and Swelling of Extremities. Gastrointestinal Present- Abdominal Pain, Change in Bowel Habits, Constipation and Hemorrhoids. Not Present- Bloating, Bloody Stool, Chronic diarrhea, Difficulty Swallowing, Excessive gas, Gets full quickly at meals, Indigestion, Nausea, Rectal Pain and Vomiting. Male Genitourinary Not Present- Blood in Urine, Change in Urinary Stream, Frequency, Impotence, Nocturia, Painful Urination, Urgency and Urine Leakage. Musculoskeletal Not Present- Back Pain, Joint Pain, Joint Stiffness, Muscle Pain, Muscle Weakness and Swelling of Extremities. Neurological Present- Headaches and Numbness. Not Present- Decreased Memory, Fainting, Seizures, Tingling, Tremor, Trouble walking and Weakness. Psychiatric Not Present- Anxiety, Bipolar, Change in Sleep  Pattern, Depression, Fearful and Frequent crying. Endocrine Not Present- Cold Intolerance, Excessive Hunger, Hair Changes, Heat Intolerance and New Diabetes. Hematology Not Present- Easy Bruising, Excessive bleeding, Gland problems, HIV and Persistent Infections.  Vitals (Rosa Rogers RMA; 07/28/2016 2:21 PM) 07/28/2016 2:20   PM Weight: 148.2 lb Height: 70in Body Surface Area: 1.84 m Body Mass Index: 21.26 kg/m  Temp.: 98.1F  Pulse: 95 (Regular)  P.OX: 99% (Room air) BP: 120/80 (Sitting, Left Arm, Standard)      Physical Exam (Brayden Brodhead MD; 07/28/2016 3:42 PM)  General Mental Status-Alert. General Appearance-Not in acute distress. Build & Nutrition-Well nourished. Posture-Normal posture. Gait-Normal.  Head and Neck Head-normocephalic, atraumatic with no lesions or palpable masses. Trachea-midline.  Chest and Lung Exam Chest and lung exam reveals -on auscultation, normal breath sounds, no adventitious sounds and normal vocal resonance.  Cardiovascular Cardiovascular examination reveals -normal heart sounds, regular rate and rhythm with no murmurs and no digital clubbing, cyanosis, edema, increased warmth or tenderness.  Abdomen Inspection Inspection of the abdomen reveals - No Hernias. Palpation/Percussion Palpation and Percussion of the abdomen reveal - Soft, Non Tender, No Rigidity (guarding), No hepatosplenomegaly and No Palpable abdominal masses.  Rectal Anorectal Exam Internal - Note: stricture.  Neurologic Neurologic evaluation reveals -alert and oriented x 3 with no impairment of recent or remote memory, normal attention span and ability to concentrate, normal sensation and normal coordination.  Musculoskeletal Normal Exam - Bilateral-Upper Extremity Strength Normal and Lower Extremity Strength Normal.    Assessment & Plan (Brixon Zhen MD; 07/28/2016 3:45 PM)  FREQUENT DEFECATION (K52.9) Impression: Patient is status post  coloanal anastomosis. He is developed an anal stricture. This is causing frequent defecation and decreased quality of life. He is losing weight due to inability to tolerate most foods. I think he will be much happier with a colostomy. If his pelvis is not too adherent, we will plan on removing his colon and anal canal as well by performing a completion APR. we discussed the possible need for open surgery. We discussed the possibility of damage to adjacent structures due to scar tissue. I think this is a reasonable chance of improving his quality of life.  The surgery and anatomy were described to the patient as well as the risks of surgery and the possible complications. These include: Bleeding, deep abdominal infections and possible wound complications such as hernia and infection, damage to adjacent structures, leak of surgical connections, which can lead to other surgeries and possibly an ostomy, possible need for other procedures, such as abscess drains in radiology, possible prolonged hospital stay, possible diarrhea from removal of part of the colon, possible constipation from narcotics, possible bowel, bladder or sexual dysfunction if having rectal surgery, prolonged fatigue/weakness or appetite loss, possible early recurrence of of disease, possible complications of their medical problems such as heart disease or arrhythmias or lung problems, death (less than 1%). I believe the patient understands and wishes to proceed with the surgery.  

## 2016-08-02 ENCOUNTER — Ambulatory Visit (HOSPITAL_BASED_OUTPATIENT_CLINIC_OR_DEPARTMENT_OTHER): Payer: BC Managed Care – PPO | Admitting: Oncology

## 2016-08-02 ENCOUNTER — Ambulatory Visit (HOSPITAL_BASED_OUTPATIENT_CLINIC_OR_DEPARTMENT_OTHER): Payer: BC Managed Care – PPO

## 2016-08-02 VITALS — BP 134/85 | HR 62 | Temp 98.5°F | Resp 18 | Ht 70.0 in | Wt 148.0 lb

## 2016-08-02 DIAGNOSIS — C787 Secondary malignant neoplasm of liver and intrahepatic bile duct: Secondary | ICD-10-CM | POA: Diagnosis not present

## 2016-08-02 DIAGNOSIS — R152 Fecal urgency: Secondary | ICD-10-CM

## 2016-08-02 DIAGNOSIS — Z006 Encounter for examination for normal comparison and control in clinical research program: Secondary | ICD-10-CM | POA: Diagnosis not present

## 2016-08-02 DIAGNOSIS — C20 Malignant neoplasm of rectum: Secondary | ICD-10-CM | POA: Diagnosis not present

## 2016-08-02 DIAGNOSIS — Z95828 Presence of other vascular implants and grafts: Secondary | ICD-10-CM

## 2016-08-02 MED ORDER — HEPARIN SOD (PORK) LOCK FLUSH 100 UNIT/ML IV SOLN
500.0000 [IU] | Freq: Once | INTRAVENOUS | Status: AC | PRN
  Administered 2016-08-02: 500 [IU] via INTRAVENOUS
  Filled 2016-08-02: qty 5

## 2016-08-02 MED ORDER — SODIUM CHLORIDE 0.9 % IJ SOLN
10.0000 mL | INTRAMUSCULAR | Status: DC | PRN
  Administered 2016-08-02: 10 mL via INTRAVENOUS
  Filled 2016-08-02: qty 10

## 2016-08-02 NOTE — Progress Notes (Addendum)
[place] [facility] OFFICE PROGRESS NOTE   Diagnosis: Rectal cancer  INTERVAL HISTORY:   Jacob [name] returns as scheduled. He continues to have rectal urgency. He met with Dr. Marcello Moores and has decided to proceed with creation of a permanent colostomy. No other complaint.  Objective:  Vital signs in last 24 hours:  Blood pressure 134/85, pulse 62, temperature 98.5 F (36.9 C), temperature source Oral, resp. rate 18, height _0  (1.778 m), weight 148 lb (67.1 kg), SpO2 100 %.    Resp: Lungs clear bilaterally Cardio: Regular rate and rhythm GI: No hepatosplenomegaly, nontender Vascular: No leg edema  Portacath/PICC-without erythema  Lab Results:  Lab Results  Component Value Date   WBC 2.9 (L) 06/21/2016   HGB 12.9 (L) 06/21/2016   HCT 38.5 06/21/2016   MCV 88.5 06/21/2016   PLT 124 (L) 06/21/2016   NEUTROABS 1.8 06/21/2016    CMP     Component Value Date/Time   NA 140 06/21/2016 1410   K 4.1 06/21/2016 1410   CL 109 09/05/2014 1459   CL 108 (H) 08/30/2012 0903   CO2 29 06/21/2016 1410   GLUCOSE 86 06/21/2016 1410   GLUCOSE 96 08/30/2012 0903   BUN 15.7 06/21/2016 1410   CREATININE 0.9 06/21/2016 1410   CALCIUM 9.7 06/21/2016 1410   PROT 6.9 06/21/2016 1410   ALBUMIN 4.1 06/21/2016 1410   AST 18 06/21/2016 1410   ALT 14 06/21/2016 1410   ALKPHOS 60 06/21/2016 1410   BILITOT 0.38 06/21/2016 1410   GFRNONAA >90 09/24/2013 0440   GFRAA >90 09/24/2013 0440    Lab Results  Component Value Date   CEA1 19.26 (H) 06/21/2016     Medications: I have reviewed the patient's current medications.  Assessment/Plan: 1.  Rectal cancer. Partially obstructing mass noted 1-2 cm from the anal verge on a colonoscopy 03/06/2012. Endoscopic ultrasound 03/14/2012 with a 7.5 mm thick, 3.2 cm wide hypoechoic, irregularly bordered mass that clearly passed into and through the muscularis propria layer of the distal rectal wall (uT3); 3 small (largest 7 mm) perirectal lymph  nodes. The lymph nodes were all round, discrete, hypoechoic, homogenous; suspicious for malignant involvement (uN1).  No RAS mutation identified by [facility] 1 testing on the colon resection specimen 06/29/2012, APC alteration identified, microsatellite stable  He began radiation and concurrent Xeloda chemotherapy on 03/20/2012, completed 04/27/2012.   Low anterior resection/coloanal anastomosis and diverting ileostomy 06/29/2012 with the final pathology revealing a T2N0 tumor with extensive fibrosis and negative margins.   Cycle 1 of adjuvant CAPOX 07/19/2012. Cycle 5 of adjuvant CAPOX 10/11/2012.   CEA 2.5 on 11/14/2012.   CEA 14.6 03/08/2013.   Restaging CT evaluation 03/08/2013 with a 4 mm pulmonary nodule left lung base not identified on comparison exam; new liver lesions including a 29 x 26 mm irregular peripheral enhancing rounded lesion in the dome of the right hepatic lobe, a less well-defined new subcapsular lesion in the lateral right hepatic lobe measuring 12 mm, a new subcapsular lesion in the anterior right hepatic lobe adjacent to the gallbladder fossa measuring 10 mm; a rounded low-density lesion in the inferior right hepatic lobe measuring 10 mm compared to 7 mm on the prior study (radiologist commented this may represent an enlarged cyst).   MRI of the abdomen 04/12/2013 confirmed multiple T2 hyperintense metastatic lesions throughout the right liver   Initiation of FOLFIRI/Avastin with genotype based irinotecan dosing per the [facility] study 04/05/2013.   Restaging CT evaluation on 06/20/2013 (after 2 cycles/4  treatments) showed improvement in the liver metastases and stable size of a left lower lobe pulmonary nodule now with central cavitation.   Continuation of FOLFIRI/Avastin.   Restaging CT evaluation 08/14/2013-decrease in the size of liver metastases, slight decrease in the size of a cavitary left lower lobe nodule, no evidence of disease progression.   Status  post right hepatic lobectomy 09/17/2013. Pathology showed multiple foci of metastatic adenocarcinoma (5 nodules of metastatic adenocarcinoma 4 of which are subcapsular with the nodules ranging in size from 0.6-1.8 cm in greatest dimension). Margins not involved. Biopsy of a portal lymph node showed benign adipose tissue; no lymph node tissue or malignancy.   Normal CEA 11/06/2013   CT 11/06/2013 with a new pleural-based right lower chest lesion, slight in enlargement of the a left-sided lung nodule  CT 01/29/2014 with a decrease in the right lower chest pleural-based lesion and a slight increase of a left-sided lung lesion, other lung lesions were stable  CT chest 05/02/2014 with a decrease in the size of a right lower lobe pulmonary nodule months similar size of a dominant left lower lobe nodule, minimal enlargement of a smaller left lower lobe nodule, no new site of disease  CT chest 11/07/2014 with a slight increase in several left-sided lung nodules  CT chest 02/10/2015 with new small left hilar lymph nodes, a possible new left lower lobe nodule, and stability of other lung nodules  PET scan 03/12/2015 with hypermetabolic left hilar nodes, hypermetabolic left lower lobe nodules, hyper metabolic retroperitoneal nodes, intense hypermetabolism at the coloanal anastomosis, and hypermetabolic thickening in the presacral space  Cycle 1 FOLFIRI/PANITUMUMAB 05/21/2015  Cycle 2 FOLFIRI/PANITUMUMAB 06/05/2015  Cycle 3 FOLFIRI/PANITUMUMAB 06/19/2015  Cycle 4 FOLFIRI 07/10/2015  Cycle 5 FOLFIRI 07/24/2015  Restaging CTs 08/06/2015-resolution of hilar/retroperitoneal adenopathy, improvement in the hypermetabolic lung nodule, other lung nodules are stable, no new lesions  Cycle 6 FOLFIRI with PANITUMUMAB 08/28/2015 -panitumumab dose reduced  Cycle 7 FOLFIRI 09/11/2015-no panitumumab given  Cycle 8 FOLFIRI with PANITUMUMAB 09/25/2015-PANITUMUMAB dose reduced  Cycle 9 FOLFIRI 10/09/2015-no  PANITUMUMAB given  Cycle 10 FOLFIRI with panitumumab 10/23/2015  CTs 11/06/2015-possible slight enlargement of a left lower lobe nodule, no other evidence of disease progression.  CTs 02/16/2016-enlargement of left-sided pulmonary nodules and mediastinal/left hilar nodes, improved splenomegaly  CTs 06/21/2016-increased size of mediastinal/left hilar nodes, increased left lung nodules, and increased soft tissue at the porta hepatis 2. Irregular bowel habits/rectal bleeding secondary to #1. 3. History of Mild elevation of the liver enzymes. Question secondary to hepatic steatosis. 4. Indeterminate 8 mm posterior right liver lesion on the staging CT 03/06/2012. 5. Mildly elevated CEA at 5.7 on 03/06/2012. 6. History of radiation erythema at the groin and perineum 7. 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 No clinical evidence of thrombophlebitis or extravasation. 8. Delayed nausea following chemotherapy-Decadron prophylaxis was added with cycle 3 CAPOX-improved. 9. Ileostomy takedown 12/07/2012. 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 11. Port-A-Cath placement 12/31/204 12. Severe neutropenia secondary to chemotherapy following cycle 1 of FOLFIRI, chemotherapy was dose reduced and he received Neulasta with day 15 cycle 1  13. Nausea and vomiting following cycle 1 of FOLFIRI 14. Rectal stricture-manual/colonoscopic dilatation by Dr. Ardis Hughs 03/01/2016 15. Leukopenia/Thrombocytopenia-persistent, potentially a sequelae of chemotherapy or hepatic toxicity from chemotherapy/radiation. Bone marrow biopsy 11/20/2014 showed cellular bone marrow with trilineage hematopoiesis. Significant dyspoiesis was not present and there was no evidence of metastatic carcinoma. Cytogenetic analysis showed the presence of normal male chromosomes with no  observable clonal chromosomal abnormalities.  probable cirrhosis 16. Genetic  testing-negative genetic panel in March 2014 17. 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 Severe over the face-steroid Dosepak prescribed 07/03/2015. Improved 07/10/2015. Further improved 07/24/2015, 08/07/2015, 08/28/2015 18. Diarrhea secondary to chemotherapy-encouraged to use Imodium 07/03/2015 19. History of Paronychia secondary to PANITUMUMAB   Disposition:  Mr. Leisinger has persistent rectal urgency. He has decided to proceed with a permanent colostomy. He appears asymptomatic from the metastatic rectal cancer. He would like to wait on considering systemic therapy until he returns from a trip to [place]. He agrees to return for a Port-A-Cath flush on 09/10/2016. He will be scheduled for an office visit on 10/25/2016. We will check a restaging chest CT prior to resuming FOLFIRI/panitumumab.  15 minutes were spent with the patient today. The majority of the time was used for counseling and coordination of care. ECOG performance status is 1 today.  Jacob Coder, MD  08/02/2016  4:16 PM

## 2016-08-08 ENCOUNTER — Telehealth: Payer: Self-pay | Admitting: Oncology

## 2016-08-08 NOTE — Telephone Encounter (Signed)
Lvm advising appts 6/22 + 8/6. Also, mailed appt calendars.

## 2016-08-09 NOTE — Patient Instructions (Signed)
GORDEN STTHOMAS  08/09/2016   Your procedure is scheduled on: 08/17/16  Report to [facility] Main  Entrance Take [place]  elevators to 3rd floor to  [place] at     [place] AM.    Call this number if you have problems the morning of surgery [phone]   Remember: ONLY 1 PERSON MAY GO WITH YOU TO SHORT STAY TO GET  READY MORNING OF YOUR SURGERY.  Do not eat food or drink liquids :After Midnight.     Take these medicines the morning of surgery with A SIP OF WATER: NONE                                You may not have any metal on your body including hair pins and              piercings  Do not wear jewelry,lotions, powders or perfumes, deodorant                Men may shave face and neck.   Do not bring valuables to the hospital. [place].  Contacts, dentures or bridgework may not be worn into surgery.  Leave suitcase in the car. After surgery it may be brought to your room.               Please read over the following fact sheets you were given: _____________________________________________________________________            [facility] - Preparing for Surgery Before surgery, you can play an important role.  Because skin is not sterile, your skin needs to be as free of germs as possible.  You can reduce the number of germs on your skin by washing with CHG (chlorahexidine gluconate) soap before surgery.  CHG is an antiseptic cleaner which kills germs and bonds with the skin to continue killing germs even after washing. Please DO NOT use if you have an allergy to CHG or antibacterial soaps.  If your skin becomes reddened/irritated stop using the CHG and inform your nurse when you arrive at Short Stay. Do not shave (including legs and underarms) for at least 48 hours prior to the first CHG shower.  You may shave your face/neck. Please follow these instructions carefully:  1.  Shower with CHG Soap the night  before surgery and the  morning of Surgery.  2.  If you choose to wash your hair, wash your hair first as usual with your  normal  shampoo.  3.  After you shampoo, rinse your hair and body thoroughly to remove the  shampoo.                           4.  Use CHG as you would any other liquid soap.  You can apply chg directly  to the skin and wash                       Gently with a scrungie or clean washcloth.  5.  Apply the CHG Soap to your body ONLY FROM THE NECK DOWN.   Do not use on face/ open  Wound or open sores. Avoid contact with eyes, ears mouth and genitals (private parts).                       Wash face,  Genitals (private parts) with your normal soap.             6.  Wash thoroughly, paying special attention to the area where your surgery  will be performed.  7.  Thoroughly rinse your body with warm water from the neck down.  8.  DO NOT shower/wash with your normal soap after using and rinsing off  the CHG Soap.                9.  Pat yourself dry with a clean towel.            10.  Wear clean pajamas.            11.  Place clean sheets on your bed the night of your first shower and do not  sleep with pets. Day of Surgery : Do not apply any lotions/deodorants the morning of surgery.  Please wear clean clothes to the hospital/surgery center.  FAILURE TO FOLLOW THESE INSTRUCTIONS MAY RESULT IN THE CANCELLATION OF YOUR SURGERY PATIENT SIGNATURE_________________________________  NURSE SIGNATURE__________________________________  ________________________________________________________________________  WHAT IS A BLOOD TRANSFUSION? Blood Transfusion Information  A transfusion is the replacement of blood or some of its parts. Blood is made up of multiple cells which provide different functions.  Red blood cells carry oxygen and are used for blood loss replacement.  White blood cells fight against infection.  Platelets control bleeding.  Plasma helps clot  blood.  Other blood products are available for specialized needs, such as hemophilia or other clotting disorders. BEFORE THE TRANSFUSION  Who gives blood for transfusions?   Healthy volunteers who are fully evaluated to make sure their blood is safe. This is blood bank blood. Transfusion therapy is the safest it has ever been in the practice of medicine. Before blood is taken from a donor, a complete history is taken to make sure that person has no history of diseases nor engages in risky social behavior (examples are intravenous drug use or sexual activity with multiple partners). The donor's travel history is screened to minimize risk of transmitting infections, such as malaria. The donated blood is tested for signs of infectious diseases, such as HIV and hepatitis. The blood is then tested to be sure it is compatible with you in order to minimize the chance of a transfusion reaction. If you or a relative donates blood, this is often done in anticipation of surgery and is not appropriate for emergency situations. It takes many days to process the donated blood. RISKS AND COMPLICATIONS Although transfusion therapy is very safe and saves many lives, the main dangers of transfusion include:   Getting an infectious disease.  Developing a transfusion reaction. This is an allergic reaction to something in the blood you were given. Every precaution is taken to prevent this. The decision to have a blood transfusion has been considered carefully by your caregiver before blood is given. Blood is not given unless the benefits outweigh the risks. AFTER THE TRANSFUSION  Right after receiving a blood transfusion, you will usually feel much better and more energetic. This is especially true if your red blood cells have gotten low (anemic). The transfusion raises the level of the red blood cells which carry oxygen, and this usually causes an energy increase.  The  nurse administering the transfusion will monitor  you carefully for complications. HOME CARE INSTRUCTIONS  No special instructions are needed after a transfusion. You may find your energy is better. Speak with your caregiver about any limitations on activity for underlying diseases you may have. SEEK MEDICAL CARE IF:   Your condition is not improving after your transfusion.  You develop redness or irritation at the intravenous (IV) site. SEEK IMMEDIATE MEDICAL CARE IF:  Any of the following symptoms occur over the next 12 hours:  Shaking chills.  You have a temperature by mouth above 102 F (38.9 C), not controlled by medicine.  Chest, back, or muscle pain.  People around you feel you are not acting correctly or are confused.  Shortness of breath or difficulty breathing.  Dizziness and fainting.  You get a rash or develop hives.  You have a decrease in urine output.  Your urine turns a dark color or changes to pink, red, or brown. Any of the following symptoms occur over the next 10 days:  You have a temperature by mouth above 102 F (38.9 C), not controlled by medicine.  Shortness of breath.  Weakness after normal activity.  The white part of the eye turns yellow (jaundice).  You have a decrease in the amount of urine or are urinating less often.  Your urine turns a dark color or changes to pink, red, or brown. Document Released: 03/05/2000 Document Revised: 05/31/2011 Document Reviewed: 10/23/2007 ExitCare Patient Information 2014 ExitCare, [place].  _______________________________________________________________________   CLEAR LIQUID DIET   Foods Allowed                                                                     Foods Excluded  Coffee and tea, regular and decaf                             liquids that you cannot  Plain Jell-O in any flavor                                             see through such as: Fruit ices (not with fruit pulp)                                     milk, soups, orange juice   Iced Popsicles                                    All solid food Carbonated beverages, regular and diet                                    Cranberry, grape and apple juices Sports drinks like Gatorade Lightly seasoned clear broth or consume(fat free) Sugar, honey syrup  Sample Menu Breakfast  Lunch                                     Supper Cranberry juice                    Beef broth                            Chicken broth Jell-O                                     Grape juice                           Apple juice Coffee or tea                        Jell-O                                      Popsicle                                                Coffee or tea                        Coffee or tea  _____________________________________________________________________

## 2016-08-11 ENCOUNTER — Encounter (HOSPITAL_COMMUNITY): Payer: Self-pay

## 2016-08-11 ENCOUNTER — Encounter (HOSPITAL_COMMUNITY)
Admission: RE | Admit: 2016-08-11 | Discharge: 2016-08-11 | Disposition: A | Discharge status: Home / Self Care | Payer: BC Managed Care – PPO | Source: Ambulatory Visit | Attending: General Surgery | Admitting: General Surgery

## 2016-08-11 DIAGNOSIS — Z01818 Encounter for other preprocedural examination: Secondary | ICD-10-CM | POA: Diagnosis present

## 2016-08-11 DIAGNOSIS — K624 Stenosis of anus and rectum: Secondary | ICD-10-CM | POA: Diagnosis not present

## 2016-08-11 LAB — BASIC METABOLIC PANEL
Anion gap: 5 (ref 5–15)
BUN: 13 mg/dL (ref 6–20)
CO2: 27 mmol/L (ref 22–32)
CREATININE: 0.82 mg/dL (ref 0.61–1.24)
Calcium: 8.8 mg/dL — ABNORMAL LOW (ref 8.9–10.3)
Chloride: 108 mmol/L (ref 101–111)
GFR calc Af Amer: >60 mL/min (ref >60)
GFR calc non Af Amer: >60 mL/min (ref >60)
GLUCOSE: 95 mg/dL (ref 65–99)
Potassium: 3.8 mmol/L (ref 3.5–5.1)
SODIUM: 140 mmol/L (ref 135–145)

## 2016-08-11 LAB — CBC
HCT: 37.8 % — ABNORMAL LOW (ref 39.0–52.0)
HEMOGLOBIN: 12.3 g/dL — AB (ref 13.0–17.0)
MCH: 29.3 pg (ref 26.0–34.0)
MCHC: 32.5 g/dL (ref 30.0–36.0)
MCV: 90 fL (ref 78.0–100.0)
Platelets: 110 10*3/uL — ABNORMAL LOW (ref 150–400)
RBC: 4.2 MIL/uL — ABNORMAL LOW (ref 4.22–5.81)
RDW: 13.7 % (ref 11.5–15.5)
WBC: 2.7 10*3/uL — ABNORMAL LOW (ref 4.0–10.5)

## 2016-08-11 NOTE — Progress Notes (Signed)
cxr 06/21/16 epic ct chest also in epic

## 2016-08-11 NOTE — Progress Notes (Signed)
Cbc done 08/11/16 routed to Dr. Marcello Moores via epic.

## 2016-08-11 NOTE — Consult Note (Addendum)
[place] Nurse requested for preoperative stoma site marking Pt is familiar with ostomy care since he states he previously had an ileostomy.  Discussed surgical procedure and stoma creation with patient and family.  Explained role of the [place] nurse team.  Provided the patient with educational booklet/DVD and provided samples of pouching options.  Answered patient questions.   Examined patient sitting, and standing in order to place the marking in the patient's visual field, away from any creases or abdominal contour issues and within the rectus muscle.  Attempted to mark below the patient's belt line, but this was not possible since there is a crease which occurs when he leans forward and this location should be avoided if possible.   Marked for colostomy in the LLQ  __6__ cm to the left of the umbilicus and __even with the umbilicus.  Patient's abdomen cleansed with CHG wipes at site markings, allowed to air dry prior to marking. Gave patient the marking pen and explained that he should fill the mark back if it fades before surgery occurs next week; pt asked appropriate questions and verbalized understanding. [place] Nurse team will follow up with patient after surgery for continue ostomy care and teaching.  Julien Girt MSN, RN, [place], [place], [place]

## 2016-08-12 LAB — HEMOGLOBIN A1C
HEMOGLOBIN A1C: 5.4 % (ref 4.8–5.6)
Mean Plasma Glucose: 108 mg/dL

## 2016-08-17 ENCOUNTER — Encounter (HOSPITAL_COMMUNITY)
Admission: RE | Disposition: A | Discharge status: Home / Self Care | Payer: Self-pay | Source: Ambulatory Visit | Attending: General Surgery

## 2016-08-17 ENCOUNTER — Inpatient Hospital Stay (HOSPITAL_COMMUNITY)
Admission: RE | Admit: 2016-08-17 | Discharge: 2016-08-21 | DRG: 330 | Disposition: A | Discharge status: Home / Self Care | Payer: BC Managed Care – PPO | Source: Ambulatory Visit | Attending: General Surgery | Admitting: General Surgery

## 2016-08-17 ENCOUNTER — Inpatient Hospital Stay (HOSPITAL_COMMUNITY): Payer: BC Managed Care – PPO | Admitting: Certified Registered"

## 2016-08-17 ENCOUNTER — Encounter (HOSPITAL_COMMUNITY): Payer: Self-pay | Admitting: *Deleted

## 2016-08-17 DIAGNOSIS — Z880 Allergy status to penicillin: Secondary | ICD-10-CM | POA: Diagnosis not present

## 2016-08-17 DIAGNOSIS — K529 Noninfective gastroenteritis and colitis, unspecified: Secondary | ICD-10-CM | POA: Diagnosis present

## 2016-08-17 DIAGNOSIS — Z9049 Acquired absence of other specified parts of digestive tract: Secondary | ICD-10-CM | POA: Diagnosis not present

## 2016-08-17 DIAGNOSIS — Z933 Colostomy status: Secondary | ICD-10-CM

## 2016-08-17 DIAGNOSIS — F419 Anxiety disorder, unspecified: Secondary | ICD-10-CM | POA: Diagnosis present

## 2016-08-17 DIAGNOSIS — D62 Acute posthemorrhagic anemia: Secondary | ICD-10-CM | POA: Diagnosis present

## 2016-08-17 DIAGNOSIS — K624 Stenosis of anus and rectum: Principal | ICD-10-CM | POA: Diagnosis present

## 2016-08-17 DIAGNOSIS — K219 Gastro-esophageal reflux disease without esophagitis: Secondary | ICD-10-CM | POA: Diagnosis present

## 2016-08-17 DIAGNOSIS — C787 Secondary malignant neoplasm of liver and intrahepatic bile duct: Secondary | ICD-10-CM

## 2016-08-17 DIAGNOSIS — C20 Malignant neoplasm of rectum: Secondary | ICD-10-CM | POA: Diagnosis present

## 2016-08-17 DIAGNOSIS — Z79899 Other long term (current) drug therapy: Secondary | ICD-10-CM

## 2016-08-17 HISTORY — PX: COLOSTOMY TAKEDOWN: SHX5258

## 2016-08-17 LAB — TYPE AND SCREEN
ABO/RH(D): A NEG
Antibody Screen: NEGATIVE

## 2016-08-17 SURGERY — CLOSURE, COLOSTOMY, LAPAROSCOPIC
Anesthesia: General | Site: Abdomen

## 2016-08-17 MED ORDER — MIDAZOLAM HCL 2 MG/2ML IJ SOLN
INTRAMUSCULAR | Status: AC
  Filled 2016-08-17: qty 2

## 2016-08-17 MED ORDER — ROCURONIUM BROMIDE 100 MG/10ML IV SOLN
INTRAVENOUS | Status: DC | PRN
  Administered 2016-08-17 (×2): 20 mg via INTRAVENOUS
  Administered 2016-08-17: 50 mg via INTRAVENOUS

## 2016-08-17 MED ORDER — LIDOCAINE HCL (CARDIAC) 20 MG/ML IV SOLN
INTRAVENOUS | Status: DC | PRN
  Administered 2016-08-17: 60 mg via INTRATRACHEAL

## 2016-08-17 MED ORDER — SUCCINYLCHOLINE CHLORIDE 200 MG/10ML IV SOSY
PREFILLED_SYRINGE | INTRAVENOUS | Status: AC
  Filled 2016-08-17: qty 10

## 2016-08-17 MED ORDER — ALVIMOPAN 12 MG PO CAPS
12.0000 mg | ORAL_CAPSULE | Freq: Two times a day (BID) | ORAL | Status: DC
  Administered 2016-08-18 (×2): 12 mg via ORAL
  Filled 2016-08-17 (×2): qty 1

## 2016-08-17 MED ORDER — CEFOTETAN DISODIUM-DEXTROSE 2-2.08 GM-% IV SOLR
INTRAVENOUS | Status: AC
  Filled 2016-08-17: qty 50

## 2016-08-17 MED ORDER — ACETAMINOPHEN 500 MG PO TABS
1000.0000 mg | ORAL_TABLET | ORAL | Status: AC
  Administered 2016-08-17: 1000 mg via ORAL
  Filled 2016-08-17: qty 2

## 2016-08-17 MED ORDER — PROMETHAZINE HCL 25 MG/ML IJ SOLN: 6.2500 mg | INTRAMUSCULAR | Status: DC | PRN

## 2016-08-17 MED ORDER — ROCURONIUM BROMIDE 50 MG/5ML IV SOSY
PREFILLED_SYRINGE | INTRAVENOUS | Status: AC
  Filled 2016-08-17: qty 5

## 2016-08-17 MED ORDER — FENTANYL CITRATE (PF) 250 MCG/5ML IJ SOLN
INTRAMUSCULAR | Status: AC
  Filled 2016-08-17: qty 5

## 2016-08-17 MED ORDER — DEXTROSE 5 % IV SOLN
INTRAVENOUS | Status: DC | PRN
  Administered 2016-08-17: 2 g via INTRAVENOUS

## 2016-08-17 MED ORDER — KCL IN DEXTROSE-NACL 20-5-0.45 MEQ/L-%-% IV SOLN
INTRAVENOUS | Status: DC
  Administered 2016-08-17 – 2016-08-18 (×2): via INTRAVENOUS
  Administered 2016-08-19: 75 mL/h via INTRAVENOUS
  Administered 2016-08-20: 06:00:00 via INTRAVENOUS
  Filled 2016-08-17 (×4): qty 1000

## 2016-08-17 MED ORDER — MIDAZOLAM HCL 2 MG/2ML IJ SOLN
INTRAMUSCULAR | Status: DC | PRN
  Administered 2016-08-17: 2 mg via INTRAVENOUS

## 2016-08-17 MED ORDER — HEPARIN SODIUM (PORCINE) 5000 UNIT/ML IJ SOLN
5000.0000 [IU] | Freq: Once | INTRAMUSCULAR | Status: AC
  Administered 2016-08-17: 5000 [IU] via SUBCUTANEOUS
  Filled 2016-08-17: qty 1

## 2016-08-17 MED ORDER — HYDROMORPHONE HCL 2 MG/ML IJ SOLN
INTRAMUSCULAR | Status: AC
  Filled 2016-08-17: qty 1

## 2016-08-17 MED ORDER — ONDANSETRON HCL 4 MG/2ML IJ SOLN
INTRAMUSCULAR | Status: DC | PRN
  Administered 2016-08-17: 4 mg via INTRAVENOUS

## 2016-08-17 MED ORDER — LACTATED RINGERS IR SOLN
Status: DC | PRN
  Administered 2016-08-17: 1000 mL

## 2016-08-17 MED ORDER — DEXAMETHASONE SODIUM PHOSPHATE 10 MG/ML IJ SOLN
INTRAMUSCULAR | Status: DC | PRN
  Administered 2016-08-17: 10 mg via INTRAVENOUS

## 2016-08-17 MED ORDER — ENOXAPARIN SODIUM 40 MG/0.4ML ~~LOC~~ SOLN
40.0000 mg | SUBCUTANEOUS | Status: DC
  Administered 2016-08-18 – 2016-08-21 (×4): 40 mg via SUBCUTANEOUS
  Filled 2016-08-17 (×4): qty 0.4

## 2016-08-17 MED ORDER — KETOROLAC TROMETHAMINE 30 MG/ML IJ SOLN
INTRAMUSCULAR | Status: AC
  Filled 2016-08-17: qty 1

## 2016-08-17 MED ORDER — BUPIVACAINE HCL (PF) 0.25 % IJ SOLN
INTRAMUSCULAR | Status: AC
  Filled 2016-08-17: qty 30

## 2016-08-17 MED ORDER — FENTANYL CITRATE (PF) 250 MCG/5ML IJ SOLN
INTRAMUSCULAR | Status: DC | PRN
  Administered 2016-08-17 (×2): 50 ug via INTRAVENOUS
  Administered 2016-08-17: 100 ug via INTRAVENOUS
  Administered 2016-08-17 (×3): 50 ug via INTRAVENOUS

## 2016-08-17 MED ORDER — ONDANSETRON HCL 4 MG/2ML IJ SOLN
INTRAMUSCULAR | Status: AC
  Filled 2016-08-17: qty 2

## 2016-08-17 MED ORDER — BUPIVACAINE HCL (PF) 0.25 % IJ SOLN
INTRAMUSCULAR | Status: DC | PRN
  Administered 2016-08-17: 30 mL

## 2016-08-17 MED ORDER — BUPIVACAINE LIPOSOME 1.3 % IJ SUSP
20.0000 mL | Freq: Once | INTRAMUSCULAR | Status: AC
  Administered 2016-08-17: 20 mL
  Filled 2016-08-17: qty 20

## 2016-08-17 MED ORDER — MORPHINE SULFATE (PF) 10 MG/ML IV SOLN
2.0000 mg | INTRAVENOUS | Status: DC | PRN
  Administered 2016-08-17: 2 mg via INTRAVENOUS
  Administered 2016-08-18 (×2): 4 mg via INTRAVENOUS
  Administered 2016-08-18: 2 mg via INTRAVENOUS
  Administered 2016-08-18: 4 mg via INTRAVENOUS
  Administered 2016-08-18: 2 mg via INTRAVENOUS
  Administered 2016-08-19 (×2): 4 mg via INTRAVENOUS
  Filled 2016-08-17 (×8): qty 1

## 2016-08-17 MED ORDER — LACTATED RINGERS IV SOLN
INTRAVENOUS | Status: DC | PRN
  Administered 2016-08-17 (×2): via INTRAVENOUS

## 2016-08-17 MED ORDER — DEXAMETHASONE SODIUM PHOSPHATE 10 MG/ML IJ SOLN
INTRAMUSCULAR | Status: AC
  Filled 2016-08-17: qty 1

## 2016-08-17 MED ORDER — ACETAMINOPHEN 500 MG PO TABS
1000.0000 mg | ORAL_TABLET | Freq: Four times a day (QID) | ORAL | Status: AC
  Administered 2016-08-17 – 2016-08-18 (×4): 1000 mg via ORAL
  Filled 2016-08-17 (×4): qty 2

## 2016-08-17 MED ORDER — 0.9 % SODIUM CHLORIDE (POUR BTL) OPTIME
TOPICAL | Status: DC | PRN
  Administered 2016-08-17: 3000 mL

## 2016-08-17 MED ORDER — LIDOCAINE 2% (20 MG/ML) 5 ML SYRINGE
INTRAMUSCULAR | Status: AC
  Filled 2016-08-17: qty 5

## 2016-08-17 MED ORDER — ALVIMOPAN 12 MG PO CAPS
12.0000 mg | ORAL_CAPSULE | ORAL | Status: AC
  Administered 2016-08-17: 12 mg via ORAL
  Filled 2016-08-17: qty 1

## 2016-08-17 MED ORDER — CELECOXIB 200 MG PO CAPS
400.0000 mg | ORAL_CAPSULE | ORAL | Status: AC
  Administered 2016-08-17: 400 mg via ORAL
  Filled 2016-08-17: qty 2

## 2016-08-17 MED ORDER — SUGAMMADEX SODIUM 200 MG/2ML IV SOLN
INTRAVENOUS | Status: DC | PRN
  Administered 2016-08-17: 120 mg via INTRAVENOUS

## 2016-08-17 MED ORDER — PROPOFOL 10 MG/ML IV BOLUS
INTRAVENOUS | Status: AC
  Filled 2016-08-17: qty 40

## 2016-08-17 MED ORDER — GABAPENTIN 300 MG PO CAPS
300.0000 mg | ORAL_CAPSULE | ORAL | Status: AC
  Administered 2016-08-17: 300 mg via ORAL
  Filled 2016-08-17: qty 1

## 2016-08-17 MED ORDER — PROPOFOL 10 MG/ML IV BOLUS
INTRAVENOUS | Status: DC | PRN
  Administered 2016-08-17: 200 mg via INTRAVENOUS

## 2016-08-17 MED ORDER — ONDANSETRON HCL 4 MG/2ML IJ SOLN
4.0000 mg | Freq: Four times a day (QID) | INTRAMUSCULAR | Status: DC | PRN
  Administered 2016-08-19: 4 mg via INTRAVENOUS
  Filled 2016-08-17: qty 2

## 2016-08-17 MED ORDER — KETOROLAC TROMETHAMINE 30 MG/ML IJ SOLN
INTRAMUSCULAR | Status: DC | PRN
  Administered 2016-08-17: 30 mg via INTRAVENOUS

## 2016-08-17 MED ORDER — ALUM & MAG HYDROXIDE-SIMETH 200-200-20 MG/5ML PO SUSP
30.0000 mL | Freq: Four times a day (QID) | ORAL | Status: DC | PRN
  Administered 2016-08-18 – 2016-08-20 (×4): 30 mL via ORAL
  Filled 2016-08-17 (×4): qty 30

## 2016-08-17 MED ORDER — CEFAZOLIN SODIUM-DEXTROSE 2-4 GM/100ML-% IV SOLN
INTRAVENOUS | Status: AC
  Filled 2016-08-17: qty 100

## 2016-08-17 MED ORDER — ONDANSETRON HCL 4 MG PO TABS: 4.0000 mg | ORAL_TABLET | Freq: Four times a day (QID) | ORAL | Status: DC | PRN

## 2016-08-17 MED ORDER — HYDROMORPHONE HCL 1 MG/ML IJ SOLN
0.2500 mg | INTRAMUSCULAR | Status: DC | PRN
  Administered 2016-08-17 (×4): 0.5 mg via INTRAVENOUS

## 2016-08-17 SURGICAL SUPPLY — 77 items
APPLIER CLIP 5 13 M/L LIGAMAX5 (MISCELLANEOUS)
BARRIER SKIN 2 3/4 (OSTOMY) ×2 IMPLANT
BARRIER SKIN 2 3/4 INCH (OSTOMY) ×1
BLADE EXTENDED COATED 6.5IN (ELECTRODE) IMPLANT
CABLE HIGH FREQUENCY MONO STRZ (ELECTRODE) ×3 IMPLANT
CELLS DAT CNTRL 66122 CELL SVR (MISCELLANEOUS) IMPLANT
CHLORAPREP W/TINT 26ML (MISCELLANEOUS) ×3 IMPLANT
CLIP APPLIE 5 13 M/L LIGAMAX5 (MISCELLANEOUS) IMPLANT
COUNTER NEEDLE 20 DBL MAG RED (NEEDLE) ×3 IMPLANT
COVER MAYO STAND STRL (DRAPES) ×9 IMPLANT
DECANTER SPIKE VIAL GLASS SM (MISCELLANEOUS) ×3 IMPLANT
DERMABOND ADVANCED (GAUZE/BANDAGES/DRESSINGS) ×2
DERMABOND ADVANCED .7 DNX12 (GAUZE/BANDAGES/DRESSINGS) ×1 IMPLANT
DRAIN CHANNEL 19F RND (DRAIN) IMPLANT
DRAPE LAPAROSCOPIC ABDOMINAL (DRAPES) ×3 IMPLANT
DRAPE SURG IRRIG POUCH 19X23 (DRAPES) ×3 IMPLANT
DRSG OPSITE POSTOP 4X10 (GAUZE/BANDAGES/DRESSINGS) IMPLANT
DRSG OPSITE POSTOP 4X6 (GAUZE/BANDAGES/DRESSINGS) ×3 IMPLANT
DRSG OPSITE POSTOP 4X8 (GAUZE/BANDAGES/DRESSINGS) IMPLANT
ELECT BLADE TIP CTD 4 INCH (ELECTRODE) ×3 IMPLANT
ELECT PENCIL ROCKER SW 15FT (MISCELLANEOUS) ×3 IMPLANT
ELECT REM PT RETURN 15FT ADLT (MISCELLANEOUS) ×3 IMPLANT
EVACUATOR SILICONE 100CC (DRAIN) IMPLANT
GAUZE SPONGE 4X4 12PLY STRL (GAUZE/BANDAGES/DRESSINGS) IMPLANT
GLOVE BIO SURGEON STRL SZ 6.5 (GLOVE) ×4 IMPLANT
GLOVE BIO SURGEONS STRL SZ 6.5 (GLOVE) ×2
GLOVE BIOGEL PI IND STRL 7.0 (GLOVE) ×2 IMPLANT
GLOVE BIOGEL PI INDICATOR 7.0 (GLOVE) ×4
GOWN STRL REUS W/TWL 2XL LVL3 (GOWN DISPOSABLE) ×6 IMPLANT
GOWN STRL REUS W/TWL XL LVL3 (GOWN DISPOSABLE) ×12 IMPLANT
GRASPER ENDOPATH ANVIL 10MM (MISCELLANEOUS) IMPLANT
HOLDER FOLEY CATH W/STRAP (MISCELLANEOUS) ×3 IMPLANT
IRRIG SUCT STRYKERFLOW 2 WTIP (MISCELLANEOUS) ×3
IRRIGATION SUCT STRKRFLW 2 WTP (MISCELLANEOUS) ×1 IMPLANT
LEGGING LITHOTOMY PAIR STRL (DRAPES) IMPLANT
LUBRICANT JELLY K Y 4OZ (MISCELLANEOUS) ×3 IMPLANT
PACK COLON (CUSTOM PROCEDURE TRAY) ×3 IMPLANT
PACK GENERAL/GYN (CUSTOM PROCEDURE TRAY) ×3 IMPLANT
PAD POSITIONING PINK XL (MISCELLANEOUS) ×3 IMPLANT
PAK SCROTO (SET/KITS/TRAYS/PACK) ×3 IMPLANT
PORT LAP GEL ALEXIS MED 5-9CM (MISCELLANEOUS) ×3 IMPLANT
POSITIONER SURGICAL ARM (MISCELLANEOUS) ×3 IMPLANT
RETRACTOR LONE STAR DISPOSABLE (INSTRUMENTS) ×3 IMPLANT
RTRCTR WOUND ALEXIS 18CM MED (MISCELLANEOUS)
SCISSORS LAP 5X35 DISP (ENDOMECHANICALS) ×3 IMPLANT
SEALER TISSUE G2 STRG ARTC 35C (ENDOMECHANICALS) ×3 IMPLANT
SEALER TISSUE X1 CVD JAW (INSTRUMENTS) IMPLANT
SLEEVE XCEL OPT CAN 5 100 (ENDOMECHANICALS) ×3 IMPLANT
SPONGE DRAIN TRACH 4X4 STRL 2S (GAUZE/BANDAGES/DRESSINGS) ×3 IMPLANT
SPONGE LAP 18X18 X RAY DECT (DISPOSABLE) IMPLANT
STAPLER PROXIMATE 75MM BLUE (STAPLE) ×3 IMPLANT
STAPLER VISISTAT 35W (STAPLE) IMPLANT
SUT ETHILON 2 0 PS N (SUTURE) IMPLANT
SUT NOVA 0 T19/GS 22DT (SUTURE) ×3 IMPLANT
SUT NOVA NAB GS-21 0 18 T12 DT (SUTURE) ×6 IMPLANT
SUT PDS AB 1 CTX 36 (SUTURE) IMPLANT
SUT PDS AB 1 TP1 96 (SUTURE) IMPLANT
SUT PROLENE 2 0 KS (SUTURE) ×3 IMPLANT
SUT SILK 2 0 (SUTURE) ×2
SUT SILK 2 0 SH CR/8 (SUTURE) ×3 IMPLANT
SUT SILK 2-0 18XBRD TIE 12 (SUTURE) ×1 IMPLANT
SUT SILK 3 0 (SUTURE) ×2
SUT SILK 3 0 SH CR/8 (SUTURE) ×3 IMPLANT
SUT SILK 3-0 18XBRD TIE 12 (SUTURE) ×1 IMPLANT
SUT VIC AB 2-0 SH 18 (SUTURE) ×21 IMPLANT
SUT VIC AB 2-0 SH 27 (SUTURE) ×2
SUT VIC AB 2-0 SH 27X BRD (SUTURE) ×1 IMPLANT
SUT VIC AB 4-0 PS2 27 (SUTURE) ×6 IMPLANT
SYS LAPSCP GELPORT 120MM (MISCELLANEOUS)
SYSTEM LAPSCP GELPORT 120MM (MISCELLANEOUS) IMPLANT
TOWEL OR NON WOVEN STRL DISP B (DISPOSABLE) ×3 IMPLANT
TRAY FOLEY W/METER SILVER 16FR (SET/KITS/TRAYS/PACK) IMPLANT
TROCAR BLADELESS OPT 5 100 (ENDOMECHANICALS) ×3 IMPLANT
TROCAR XCEL BLUNT TIP 100MML (ENDOMECHANICALS) IMPLANT
TUBING CONNECTING 10 (TUBING) ×4 IMPLANT
TUBING CONNECTING 10' (TUBING) ×2
TUBING INSUF HEATED (TUBING) ×3 IMPLANT

## 2016-08-17 NOTE — Anesthesia Preprocedure Evaluation (Signed)
Anesthesia Evaluation  Patient identified by MRN, date of birth, ID band Patient awake    Reviewed: Allergy & Precautions, NPO status , Patient's Chart, lab work & pertinent test results  Airway Mallampati: II  TM Distance: >3 FB Neck ROM: Full    Dental no notable dental hx.    Pulmonary neg pulmonary ROS,    Pulmonary exam normal breath sounds clear to auscultation       Cardiovascular Normal cardiovascular exam Rhythm:Regular Rate:Normal     Neuro/Psych negative neurological ROS  negative psych ROS   GI/Hepatic Neg liver ROS, GERD  Medicated,  Endo/Other  negative endocrine ROS  Renal/GU negative Renal ROS  negative genitourinary   Musculoskeletal negative musculoskeletal ROS (+)   Abdominal   Peds negative pediatric ROS (+)  Hematology negative hematology ROS (+)   Anesthesia Other Findings   Reproductive/Obstetrics negative OB ROS                             Anesthesia Physical Anesthesia Plan  ASA: II  Anesthesia Plan: General   Post-op Pain Management:    Induction: Intravenous  Airway Management Planned: Oral ETT  Additional Equipment:   Intra-op Plan:   Post-operative Plan: Extubation in OR  Informed Consent: I have reviewed the patients History and Physical, chart, labs and discussed the procedure including the risks, benefits and alternatives for the proposed anesthesia with the patient or authorized representative who has indicated his/her understanding and acceptance.   Dental advisory given  Plan Discussed with: CRNA and Surgeon  Anesthesia Plan Comments:         Anesthesia Quick Evaluation

## 2016-08-17 NOTE — Transfer of Care (Signed)
Immediate Anesthesia Transfer of Care Note  Patient: Jacob [name]  Procedure(s) Performed: Procedure(s): LAPAROSCOPIC ABDOMINOPERINEAL RESECTION WITH PERMANENT COLOSTOMY (N/A)  Patient Location: PACU  Anesthesia Type:General  Level of Consciousness: alert  and patient cooperative  Airway & Oxygen Therapy: Patient connected to face mask oxygen  Post-op Assessment: Report given to RN, Post -op Vital signs reviewed and stable and Patient moving all extremities X 4  Post vital signs: stable  Last Vitals:  Vitals:   08/17/16 0543  BP: 128/79  Pulse: (!) 55  Resp: 16  Temp: 36.5 C    Last Pain:  Vitals:   08/17/16 0611  TempSrc:   PainSc: 0-No pain      Patients Stated Pain Goal: 3 (66/59/93 5701)  Complications: No apparent anesthesia complications

## 2016-08-17 NOTE — Anesthesia Procedure Notes (Signed)
Procedure Name: Intubation Date/Time: 08/17/2016 7:46 AM Performed by: Pilar Grammes Pre-anesthesia Checklist: Patient identified, Emergency Drugs available, Suction available, Patient being monitored and Timeout performed Patient Re-evaluated:Patient Re-evaluated prior to inductionOxygen Delivery Method: Circle system utilized Preoxygenation: Pre-oxygenation with 100% oxygen Intubation Type: IV induction Ventilation: Mask ventilation without difficulty Laryngoscope Size: Miller and 2 Grade View: Grade I Tube type: Oral Tube size: 7.5 mm Number of attempts: 1 Airway Equipment and Method: Stylet Placement Confirmation: positive ETCO2,  ETT inserted through vocal cords under direct vision,  CO2 detector and breath sounds checked- equal and bilateral Secured at: 23 cm Tube secured with: Tape Dental Injury: Teeth and Oropharynx as per pre-operative assessment

## 2016-08-17 NOTE — H&P (Signed)
History of Present Illness Jacob Ruff MD; 03/28/6158 3:39 PM) The patient is a 46 year old male who presents with anal itching. Patient presents to the office for follow-up after rectal cancer and low anterior resection with coloanal anastomosis April 2014. He has had difficulty with his bowel habits and surgery. He complains of rectal urgency and frequency. He spends a large amount of his time in the restroom. He has a limited diet and has lost approximately 30 pounds due to inability to eat certain foods. He continues to have rectal bleeding as well. 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  Diagnostic Studies History Jacob Ruff, MD; 06/25/2701 3:39 PM) Colonoscopy 1-5 years ago  Allergies Jerrye Bushy, [place]; 07/28/2016 2:19 PM) Penicillin G Benzathine & Proc *PENICILLINS* Rash. Allergies Reconciled  Medication History Jacob Ruff, MD; 5/0/0938 3:39 PM) Anusol-HC (25MG  Suppository, 1 (one) suppository Rectal two times daily prn, Taken starting 02/22/2014) Active. ALPRAZolam (0.25MG  Tablet, Oral) Active. Lidocaine-Prilocaine (2.5-2.5% Cream, External) Active. Spironolactone (25MG  Tablet, Oral) Active. Medications Reconciled Neomycin Sulfate (500MG  Tablet, 2 (two) Tablet Oral SEE NOTE, Taken starting 07/28/2016) Active. (TAKE TWO TABLETS AT 2 PM, 3 PM, AND 10 PM THE DAY PRIOR TO SURGERY) Flagyl (500MG  Tablet, 2 (two) Tablet Oral SEE NOTE, Taken starting 07/28/2016) Active. (Take at 2pm, 3pm, and 10pm the day prior to your colon operation)  Social History Jacob Ruff, MD; 03/30/2991 3:39 PM) Alcohol use Occasional alcohol use. Caffeine use Carbonated beverages, Coffee, Tea. Illicit drug use Prefer to discuss  with provider. Tobacco use Never smoker.  Family History Jacob Ruff, MD; 09/19/6965 3:39 PM) Family history unknown First Degree Relatives  Other Problems Jacob Ruff, MD; 10/28/3808 3:39 PM) Anxiety Disorder Back Pain Gastroesophageal Reflux Disease Hemorrhoids Inguinal Hernia Migraine Headache Rectal Cancer     Review of Systems Jacob Ruff MD; 03/28/5100 3:39 PM) General Not Present- Appetite Loss, Chills, Fatigue, Fever, Night Sweats, Weight Gain and Weight Loss. Skin Not Present- Change in Wart/Mole, Dryness, Hives, Jaundice, New Lesions, Non-Healing Wounds, Rash and Ulcer. HEENT Present- Ringing in the Ears and Seasonal Allergies. Not Present- Earache, Hearing Loss, Hoarseness, Nose Bleed, Oral Ulcers, Sinus Pain, Sore Throat, Visual Disturbances, Wears glasses/contact lenses and Yellow Eyes. Respiratory Not Present- Bloody sputum, Chronic Cough, Difficulty Breathing, Snoring and Wheezing. Breast Not Present- Breast Mass, Breast Pain, Nipple Discharge and Skin Changes. Cardiovascular Not Present- Chest Pain, Difficulty Breathing Lying Down, Leg Cramps, Palpitations, Rapid Heart Rate, Shortness of Breath and Swelling of Extremities. Gastrointestinal Present- Abdominal Pain, Change in Bowel Habits, Constipation and Hemorrhoids. Not Present- Bloating, Bloody Stool, Chronic diarrhea, Difficulty Swallowing, Excessive gas, Gets full quickly at meals, Indigestion, Nausea, Rectal Pain and Vomiting. Male Genitourinary Not Present- Blood in Urine, Change in Urinary Stream, Frequency, Impotence, Nocturia, Painful Urination, Urgency and Urine Leakage. Musculoskeletal Not Present- Back Pain, Joint Pain, Joint Stiffness, Muscle Pain, Muscle Weakness and Swelling of Extremities. Neurological Present- Headaches and Numbness. Not Present- Decreased Memory, Fainting, Seizures, Tingling, Tremor, Trouble walking and Weakness. Psychiatric Not Present- Anxiety, Bipolar, Change in Sleep  Pattern, Depression, Fearful and Frequent crying. Endocrine Not Present- Cold Intolerance, Excessive Hunger, Hair Changes, Heat Intolerance and New Diabetes. Hematology Not Present- Easy Bruising, Excessive bleeding, Gland problems, HIV and Persistent Infections.  Vitals Basilia Jumbo Rogers RMA; 07/28/2016 2:21 PM) 07/28/2016 2:20  PM Weight: 148.2 lb Height: 70in Body Surface Area: 1.84 m Body Mass Index: 21.26 kg/m  Temp.: 98.71F  Pulse: 95 (Regular)  P.OX: 99% (Room air) BP: 120/80 (Sitting, Left Arm, Standard)      Physical Exam Jacob Ruff MD; 06/22/3534 3:42 PM)  General Mental Status-Alert. General Appearance-Not in acute distress. Build & Nutrition-Well nourished. Posture-Normal posture. Gait-Normal.  Head and Neck Head-normocephalic, atraumatic with no lesions or palpable masses. Trachea-midline.  Chest and Lung Exam Chest and lung exam reveals -on auscultation, normal breath sounds, no adventitious sounds and normal vocal resonance.  Cardiovascular Cardiovascular examination reveals -normal heart sounds, regular rate and rhythm with no murmurs and no digital clubbing, cyanosis, edema, increased warmth or tenderness.  Abdomen Inspection Inspection of the abdomen reveals - No Hernias. Palpation/Percussion Palpation and Percussion of the abdomen reveal - Soft, Non Tender, No Rigidity (guarding), No hepatosplenomegaly and No Palpable abdominal masses.  Rectal Anorectal Exam Internal - Note: stricture.  Neurologic Neurologic evaluation reveals -alert and oriented x 3 with no impairment of recent or remote memory, normal attention span and ability to concentrate, normal sensation and normal coordination.  Musculoskeletal Normal Exam - Bilateral-Upper Extremity Strength Normal and Lower Extremity Strength Normal.    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 He is developed an anal stricture. This is causing frequent defecation and decreased quality of life. He is losing weight due to inability to tolerate most foods. I think he will be much happier with a colostomy. If his pelvis is not too adherent, we will plan on removing his colon and anal canal as well by performing a completion APR. we discussed the possible need for open surgery. We discussed the possibility of damage to adjacent structures due to scar tissue. I think this is a reasonable chance of improving his quality of life.  The surgery and anatomy were described to the patient as well as the risks of surgery and the possible complications. These include: Bleeding, deep abdominal infections and possible wound complications such as hernia and infection, damage to adjacent structures, leak of surgical connections, which can lead to other surgeries and possibly an ostomy, possible need for other procedures, such as abscess drains in radiology, possible prolonged hospital stay, possible diarrhea from removal of part of the colon, possible constipation from narcotics, possible bowel, bladder or sexual dysfunction if having rectal surgery, prolonged fatigue/weakness or appetite loss, possible early recurrence of of disease, possible complications of their medical problems such as heart disease or arrhythmias or lung problems, death (less than 1%). I believe the patient understands and wishes to proceed with the surgery.

## 2016-08-17 NOTE — Anesthesia Postprocedure Evaluation (Addendum)
Anesthesia Post Note  Patient: Jacob [name]  Procedure(s) Performed: Procedure(s) (LRB): LAPAROSCOPIC ABDOMINOPERINEAL RESECTION WITH PERMANENT COLOSTOMY (N/A)  Patient location during evaluation: PACU Anesthesia Type: General Level of consciousness: awake and alert Pain management: pain level controlled Vital Signs Assessment: post-procedure vital signs reviewed and stable Respiratory status: spontaneous breathing, nonlabored ventilation, respiratory function stable and patient connected to nasal cannula oxygen Cardiovascular status: blood pressure returned to baseline and stable Postop Assessment: no signs of nausea or vomiting Anesthetic complications: no       Last Vitals:  Vitals:   08/17/16 1306 08/17/16 1415  BP: 132/75 114/71  Pulse: 72 62  Resp: 16 16  Temp: 36.5 C 36.5 C    Last Pain:  Vitals:   08/17/16 1415  TempSrc: Oral  PainSc:                  Revin Corker S

## 2016-08-17 NOTE — Op Note (Signed)
08/17/2016  10:43 AM  PATIENT:  Jacob [name]  46 y.o. male  Patient Care Team: Mosie Lukes, MD as PCP - General (Family Medicine) Milus Banister, MD as Attending Physician (Gastroenterology) Carola Frost, RN as Registered Nurse Benay Spice Izola Price, MD as Consulting Physician (Oncology) Benson Norway, RN as Registered Nurse (Oncology)  PRE-OPERATIVE DIAGNOSIS:  Anal stricture  POST-OPERATIVE DIAGNOSIS:  Coloanal stricture  PROCEDURE:   (PALLIATIVE) LAPAROSCOPIC ABDOMINOPERINEAL RESECTION     Surgeon(s): Leighton Ruff, MD  ASSISTANT: none   ANESTHESIA:   local and general  EBL: 215ml Total I/O In: 1000 [I.V.:1000] Out: 600 [Urine:70; Blood:530]  Delay start of Pharmacological VTE agent (>24hrs) due to surgical blood loss or risk of bleeding:  no  DRAINS: (14F) Jackson-Pratt drain(s) with closed bulb suction in the pelvis   SPECIMEN:  Source of Specimen:  Coloanal anastomosis and anal canal  DISPOSITION OF SPECIMEN:  PATHOLOGY  COUNTS:  YES  PLAN OF CARE: Admit to inpatient   PATIENT DISPOSITION:  PACU - hemodynamically stable.  INDICATION:    Patient presents to the office with complications from coloanal anastomosis for rectal cancer.   I recommended completion palliative APR:  The anatomy & physiology of the digestive tract was discussed.  The pathophysiology was discussed.  Natural history risks without surgery was discussed.   I worked to give an overview of the disease and the frequent need to have multispecialty involvement.  I feel the risks of no intervention will lead to serious problems that outweigh the operative risks; therefore, I recommended a partial colectomy to remove the pathology.  Laparoscopic & open techniques were discussed.   Risks such as bleeding, infection, abscess, leak, reoperation, possible ostomy, hernia, heart attack, death, and other risks were discussed.  I noted a good likelihood this will help address the problem.   Goals of  post-operative recovery were discussed as well.    The patient expressed understanding & wished to proceed with surgery.  OR FINDINGS:   Patient had coloanal stricture and prolapse.  No obvious metastatic disease on visceral parietal peritoneum or liver.    DESCRIPTION:   Informed consent was confirmed.  The patient underwent general anaesthesia without difficulty.  The patient was positioned appropriately.  VTE prevention in place.  The patient's abdomen was clipped, prepped, & draped in a sterile fashion.  Surgical timeout confirmed our plan.  The patient was positioned in reverse Trendelenburg.  Abdominal entry was gained using an open technique infraumbilically.  Entry was clean.  I induced carbon dioxide insufflation.  Camera inspection revealed no injury.  Extra ports were carefully placed under direct laparoscopic visualization. I removed the omentum from the pelvis using a laparoscopic Enseal device.  I then began to mobilize the colon posteriorly along the edge of the sacrum using the Enseal device.  I identified the left ureter and mobilized the colon medial to this away from the left pelvic sidewall.  I mobilized the right pelvic sidewall in similar fashion taking the mesentery as I went with the Enseal device.  I used blunt mobilization to continue to mobilize to the level of the coccyx.  This caused some pelvic bleeding.  I did not feel comfortable managing this laparoscopically and therefore I made a small lower midline incision.  An [place] wound protector was placed.  I continued to bluntly mobilize the colon off of the sacrum.  I was able to then palpate areas of scar in the left and right posterior lateral position  and the pelvis.  These were divided with the Enseal device. I was then able to pack the pelvis with a sponge. I then divided the colon with a GIA stapler.  The mesentery was tied off using a 2-0 Vicryl suture. I Turned my attention to the patient's perineum.  I made an  incision along the anal canal using electrocautery.  Dissection was carried around the anal canal using electrocautery.  I entered into the pelvis posteriorly above the coccyx.  I then mobilized the left and right lateral side walls using a combination of electrocautery and sharp dissection with suture scissors due to his significant scar.  I then mobilized anteriorly separating this from the anterior peroneal structures.  The specimen was then brought out of the perineal wound and sent to pathology for urther examination.  I closed the pelvic floor in layers using interrupted 2-0 Vicryl suture.  This was difficult due to his significant scarring.  I then switched nails and gloves and went back up to the patient's abdomen.  A remove the packing from the pelvis.  There was no additional bleeding noted.  The omentum was brought down into the pelvis and sutured to the right lateral sidewall.  I was unable to close the peritoneum due to significant scarring.  I brought out the remainder of the colon through the previously marked ostomy site in the left lower quadrant.  A cruciate incision was made in the fascia.  This was dilated at the peritoneal level to 2 fingerbreadths width.  The colon was brought out and secured. A JP drain was left in the pelvis.  This was brought out through the right lower quadrant port site.  This was secured into place with a suture.  The fascia was then closed using interrupted 0 Novafil sutures.  The dermal layer was reapproximated using interrupted 2-0 Vicryl sutures.  The skin was then closed using a 4-0 Vicryl running subcuticular suture.  The [facility] port site was closed using a 0 Vicryl suture, 2-0 Vicryl suture for the subcutaneous tissue and a 4-0 Vicryl running subcuticular suture.  The port site was also closed with a 4-0 Vicryl suture.  Dermabond was placed over the port incisions and the midline incision was dressed with a sterile dressing.  After this the ostomy was matured in  standard [place] fashion.  This was done using 2-0 Vicryl sutures.  An ostomy appliance was placed.  The patient tolerated this well and was awakened from anesthesia and sent to the postanesthesia care unit in stable condition.  All counts are correct per operating room staff.

## 2016-08-18 ENCOUNTER — Encounter (HOSPITAL_COMMUNITY): Payer: Self-pay | Admitting: General Surgery

## 2016-08-18 LAB — BASIC METABOLIC PANEL
ANION GAP: 7 (ref 5–15)
BUN: 16 mg/dL (ref 6–20)
CHLORIDE: 106 mmol/L (ref 101–111)
CO2: 25 mmol/L (ref 22–32)
Calcium: 8.5 mg/dL — ABNORMAL LOW (ref 8.9–10.3)
Creatinine, Ser: 1.12 mg/dL (ref 0.61–1.24)
GFR calc Af Amer: >60 mL/min (ref >60)
GFR calc non Af Amer: >60 mL/min (ref >60)
Glucose, Bld: 128 mg/dL — ABNORMAL HIGH (ref 65–99)
POTASSIUM: 3.8 mmol/L (ref 3.5–5.1)
Sodium: 138 mmol/L (ref 135–145)

## 2016-08-18 LAB — CBC
HEMATOCRIT: 29.5 % — AB (ref 39.0–52.0)
HEMOGLOBIN: 10.1 g/dL — AB (ref 13.0–17.0)
MCH: 30.1 pg (ref 26.0–34.0)
MCHC: 34.2 g/dL (ref 30.0–36.0)
MCV: 87.8 fL (ref 78.0–100.0)
Platelets: 141 10*3/uL — ABNORMAL LOW (ref 150–400)
RBC: 3.36 MIL/uL — AB (ref 4.22–5.81)
RDW: 13.8 % (ref 11.5–15.5)
WBC: 8 10*3/uL (ref 4.0–10.5)

## 2016-08-18 MED ORDER — ACETAMINOPHEN 500 MG PO TABS
1000.0000 mg | ORAL_TABLET | Freq: Four times a day (QID) | ORAL | Status: DC
  Administered 2016-08-18 – 2016-08-21 (×11): 1000 mg via ORAL
  Filled 2016-08-18 (×12): qty 2

## 2016-08-18 NOTE — Progress Notes (Signed)
1 Day Post-Op hand assisted APR Subjective: Did well overnight.  Had some pain and nausea this AM  Objective: Vital signs in last 24 hours: Temp:  [97.7 F (36.5 C)-98.5 F (36.9 C)] 98.2 F (36.8 C) (05/30 0439) Pulse Rate:  [59-81] 60 (05/30 0439) Resp:  [11-18] 18 (05/30 0439) BP: (111-148)/(62-90) 148/79 (05/30 0439) SpO2:  [99 %-100 %] 100 % (05/30 0439)   Intake/Output from previous day: 05/29 0701 - 05/30 0700 In: 3090 [P.O.:240; I.V.:2850] Out: 8088 [Urine:820; Drains:295; Stool:150; Blood:530] Intake/Output this shift: Total I/O In: 360 [P.O.:360] Out: 100 [Drains:100]   General appearance: alert and cooperative GI: normal findings: soft, appropriately tender  Incision: no significant drainage  Lab Results:   Recent Labs  08/18/16 0517  WBC 8.0  HGB 10.1*  HCT 29.5*  PLT 141*   BMET  Recent Labs  08/18/16 0517  NA 138  K 3.8  CL 106  CO2 25  GLUCOSE 128*  BUN 16  CREATININE 1.12  CALCIUM 8.5*   PT/INR No results for input(s): LABPROT, INR in the last 72 hours. ABG No results for input(s): PHART, HCO3 in the last 72 hours.  Invalid input(s): PCO2, PO2  MEDS, Scheduled . alvimopan  12 mg Oral BID  . enoxaparin (LOVENOX) injection  40 mg Subcutaneous Q24H    Studies/Results: No results found.  Assessment: s/p Procedure(s): LAPAROSCOPIC ABDOMINOPERINEAL RESECTION WITH PERMANENT COLOSTOMY Patient Active Problem List   Diagnosis Date Noted  . 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 Port catheter in place 08/28/2015  . Leukopenia   . Thrombocytopenia ([place])   . Lung nodule, multiple 11/29/2013  . Metastatic adenocarcinoma to liver ([place]) 09/17/2013  . 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[place]) 09/17/2013  . Rectal cancer, metastatic to liver 08/28/2013  . 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 Anemia 12/31/2012  . Grief reaction 06/18/2012  . Malignant neoplasm of rectum ([place]) 03/17/2012  . GERD (gastroesophageal  reflux disease)   . Elevated liver function tests 01/19/2012  . Overweight(278.02) 01/19/2012  . Elevated BP 01/19/2012  . Sun-damaged skin 01/19/2012    Expected post op course  Plan: Cont clears as tolerated Cont MIV Ambulate   LOS: 1 day     .Rosario Adie, [facility], [place]   08/18/2016 8:40 AM

## 2016-08-18 NOTE — Consult Note (Signed)
[place] Nurse ostomy consult note Stoma type/location: LLQ colostomy. Stomal assessment/size: 1 and 3/4 inches round, red, edematous, raised, os at center. Peristomal assessment: intact and in a gulley. Treatment options for stomal/peristomal skin: skin barrier ring Output: liquid brown effluent in a small amount. Ostomy pouching: 2pc. 2 and 3/4 inch pouch ing system with a skin barrier ring to fill parastomal gulley. This may resolve with subsiding edema Education provided: Extended session with patient and mother.  Wife is in room but unable to watch.  Patient had an ileostomy stoma 4 years ago and it was reversed. Recalls components of emptying, pouch change procedure, but is learning all of the ways a colostomy is different from an ileostomy.  Has reconsidered and may wish to have a [place] visit a couple of times a week for a couple of weeks until he regains his confidence.  If you agree, please order.  He had a bad experience with [facility] the last time and reports that no nurse ever came to his home after repeatedly trying to secure help. We discussed the need to cut today's skin barrier off-center to allow surgical dressing over incision to remain intact. We discuss the need for constant resizing in the early weeks after surgery to ensure a correct fit. He expresses the desire to be taught irrigation when Dr. Marcello Moores approves, he is taught that ideally it is 6-8 weeks after surgery.  Patient and wife are to travel to [place] at the end of June for a month and he is concerned about having enough supply.  I will give him 3 weeks worth of supplies upon discharge and he will receive an additional 2 weeks from the [employer].  I stress that his equipment is currently a 2 and 3/4 inch supply and that he may require a 2 and 1/4 inch supply after 4 weeks and he is understanding of that.  I will stress the need to establish (reestablish himself) with a supplier in the next couple of weeks. Patient can  perform Lock and Roll closure and recalls how to clean the bottom 2-inches of the tail closure, but this will need to be demonstrated prior to discharge.  I will ask one of my partners to see on Friday and observe him performing a pouch change. Enrolled patient in [place] program: Yes [place] nursing team will follow, will remain available to this patient, the nursing and medical teams.   Thanks, Maudie Flakes, MSN, RN, [place], Arther Abbott  Pager# [phone]

## 2016-08-19 LAB — BASIC METABOLIC PANEL
ANION GAP: 5 (ref 5–15)
BUN: 13 mg/dL (ref 6–20)
CALCIUM: 8.3 mg/dL — AB (ref 8.9–10.3)
CO2: 28 mmol/L (ref 22–32)
Chloride: 103 mmol/L (ref 101–111)
Creatinine, Ser: 0.79 mg/dL (ref 0.61–1.24)
GFR calc Af Amer: >60 mL/min (ref >60)
GLUCOSE: 122 mg/dL — AB (ref 65–99)
POTASSIUM: 4.3 mmol/L (ref 3.5–5.1)
Sodium: 136 mmol/L (ref 135–145)

## 2016-08-19 LAB — CBC
HEMATOCRIT: 28.4 % — AB (ref 39.0–52.0)
HEMOGLOBIN: 9.6 g/dL — AB (ref 13.0–17.0)
MCH: 30 pg (ref 26.0–34.0)
MCHC: 33.8 g/dL (ref 30.0–36.0)
MCV: 88.8 fL (ref 78.0–100.0)
Platelets: 100 10*3/uL — ABNORMAL LOW (ref 150–400)
RBC: 3.2 MIL/uL — ABNORMAL LOW (ref 4.22–5.81)
RDW: 14.1 % (ref 11.5–15.5)
WBC: 6.5 10*3/uL (ref 4.0–10.5)

## 2016-08-19 MED ORDER — MORPHINE SULFATE (PF) 2 MG/ML IV SOLN
2.0000 mg | INTRAVENOUS | Status: DC | PRN
  Administered 2016-08-19: 4 mg via INTRAVENOUS
  Administered 2016-08-20: 2 mg via INTRAVENOUS
  Filled 2016-08-19: qty 2
  Filled 2016-08-19: qty 1

## 2016-08-19 MED ORDER — SODIUM CHLORIDE 0.9% FLUSH
10.0000 mL | INTRAVENOUS | Status: DC | PRN
  Administered 2016-08-20 – 2016-08-21 (×2): 10 mL
  Filled 2016-08-19 (×2): qty 40

## 2016-08-19 NOTE — Progress Notes (Signed)
Pt is able to demonstrate emptying and burping his ostomy and feels competent in completing these tasks on his own.

## 2016-08-19 NOTE — Progress Notes (Signed)
2 Days Post-Op hand assisted APR Subjective: Did well overnight.  Pain better.  No further nausea.  Foley out this AM.  Colostomy functioning  Objective: Vital signs in last 24 hours: Temp:  [98 F (36.7 C)-98.7 F (37.1 C)] 98.2 F (36.8 C) (05/31 0529) Pulse Rate:  [65-83] 75 (05/31 0529) Resp:  [18] 18 (05/31 0529) BP: (114-150)/(72-88) 128/79 (05/31 0529) SpO2:  [100 %] 100 % (05/31 0529)   Intake/Output from previous day: 05/30 0701 - 05/31 0700 In: 2928.8 [P.O.:1140; I.V.:1788.8] Out: 5573 [Urine:1100; Drains:260; Stool:325] Intake/Output this shift: No intake/output data recorded.   General appearance: alert and cooperative GI: normal findings: soft, appropriately tender  Incision: no significant drainage  Lab Results:   Recent Labs  08/18/16 0517 08/19/16 0515  WBC 8.0 6.5  HGB 10.1* 9.6*  HCT 29.5* 28.4*  PLT 141* PENDING   BMET  Recent Labs  08/18/16 0517 08/19/16 0515  NA 138 136  K 3.8 4.3  CL 106 103  CO2 25 28  GLUCOSE 128* 122*  BUN 16 13  CREATININE 1.12 0.79  CALCIUM 8.5* 8.3*   PT/INR No results for input(s): LABPROT, INR in the last 72 hours. ABG No results for input(s): PHART, HCO3 in the last 72 hours.  Invalid input(s): PCO2, PO2  MEDS, Scheduled . acetaminophen  1,000 mg Oral Q6H  . alvimopan  12 mg Oral BID  . enoxaparin (LOVENOX) injection  40 mg Subcutaneous Q24H    Studies/Results: No results found.  Assessment: s/p Procedure(s): LAPAROSCOPIC ABDOMINOPERINEAL RESECTION WITH PERMANENT COLOSTOMY Patient Active Problem List   Diagnosis Date Noted  . 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 Port catheter in place 08/28/2015  . Leukopenia   . Thrombocytopenia ([place])   . Lung nodule, multiple 11/29/2013  . Metastatic adenocarcinoma to liver ([place]) 09/17/2013  . 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[place]) 09/17/2013  . Rectal cancer, metastatic to liver 08/28/2013  . Peripheral neuropathy, secondary to drugs or chemicals  12/31/2012  . Anemia 12/31/2012  . Grief reaction 06/18/2012  . Malignant neoplasm of rectum ([place]) 03/17/2012  . GERD (gastroesophageal reflux disease)   . Elevated liver function tests 01/19/2012  . Overweight(278.02) 01/19/2012  . Elevated BP 01/19/2012  . Sun-damaged skin 01/19/2012    Expected post op course  Plan: Advance to FLD Decrease MIV Ambulate   LOS: 2 days     .Rosario Adie, [facility], [place]   08/19/2016 7:41 AM

## 2016-08-19 NOTE — Progress Notes (Signed)
Spoke with patient again regarding [place] services. He continues to decline, states he does not feel it will be necessary. He plans on being independent at d/c.

## 2016-08-20 LAB — CBC
HEMATOCRIT: 27.1 % — AB (ref 39.0–52.0)
HEMOGLOBIN: 9.2 g/dL — AB (ref 13.0–17.0)
MCH: 30.1 pg (ref 26.0–34.0)
MCHC: 33.9 g/dL (ref 30.0–36.0)
MCV: 88.6 fL (ref 78.0–100.0)
Platelets: 107 10*3/uL — ABNORMAL LOW (ref 150–400)
RBC: 3.06 MIL/uL — AB (ref 4.22–5.81)
RDW: 13.9 % (ref 11.5–15.5)
WBC: 5.9 10*3/uL (ref 4.0–10.5)

## 2016-08-20 LAB — BASIC METABOLIC PANEL
ANION GAP: 6 (ref 5–15)
BUN: 11 mg/dL (ref 6–20)
CALCIUM: 8.4 mg/dL — AB (ref 8.9–10.3)
CO2: 27 mmol/L (ref 22–32)
Chloride: 103 mmol/L (ref 101–111)
Creatinine, Ser: 0.74 mg/dL (ref 0.61–1.24)
Glucose, Bld: 110 mg/dL — ABNORMAL HIGH (ref 65–99)
POTASSIUM: 3.8 mmol/L (ref 3.5–5.1)
SODIUM: 136 mmol/L (ref 135–145)

## 2016-08-20 MED ORDER — OXYCODONE HCL 5 MG PO TABS
5.0000 mg | ORAL_TABLET | ORAL | Status: DC | PRN
  Administered 2016-08-21: 10 mg via ORAL
  Filled 2016-08-20: qty 2

## 2016-08-20 NOTE — Progress Notes (Signed)
3 Days Post-Op hand assisted APR Subjective: Doing much better.  Pain better.  No nausea. Voiding well.  Colostomy functioning.  Tolerating liquids  Objective: Vital signs in last 24 hours: Temp:  [98.3 F (36.8 C)-98.6 F (37 C)] 98.6 F (37 C) (06/01 0533) Pulse Rate:  [73-96] 73 (06/01 0533) Resp:  [16] 16 (06/01 0533) BP: (130-139)/(78-95) 130/78 (06/01 0533) SpO2:  [100 %] 100 % (06/01 0533)   Intake/Output from previous day: 05/31 0701 - 06/01 0700 In: 2032.3 [P.O.:1320; I.V.:712.3] Out: 5035 [Urine:950; Drains:145; Stool:600] Intake/Output this shift: No intake/output data recorded.   General appearance: alert and cooperative GI: normal findings: soft, appropriately tender  Incision: no significant drainage  Lab Results:   Recent Labs  08/19/16 0515 08/20/16 0451  WBC 6.5 5.9  HGB 9.6* 9.2*  HCT 28.4* 27.1*  PLT 100* 107*   BMET  Recent Labs  08/19/16 0515 08/20/16 0451  NA 136 136  K 4.3 3.8  CL 103 103  CO2 28 27  GLUCOSE 122* 110*  BUN 13 11  CREATININE 0.79 0.74  CALCIUM 8.3* 8.4*   PT/INR No results for input(s): LABPROT, INR in the last 72 hours. ABG No results for input(s): PHART, HCO3 in the last 72 hours.  Invalid input(s): PCO2, PO2  MEDS, Scheduled . acetaminophen  1,000 mg Oral Q6H  . enoxaparin (LOVENOX) injection  40 mg Subcutaneous Q24H    Studies/Results: No results found.  Assessment: s/p Procedure(s): LAPAROSCOPIC ABDOMINOPERINEAL RESECTION WITH PERMANENT COLOSTOMY Patient Active Problem List   Diagnosis Date Noted  . 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 Port catheter in place 08/28/2015  . Leukopenia   . Thrombocytopenia ([place])   . Lung nodule, multiple 11/29/2013  . Metastatic adenocarcinoma to liver ([place]) 09/17/2013  . 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[place]) 09/17/2013  . Rectal cancer, metastatic to liver 08/28/2013  . 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 Anemia 12/31/2012  .  Grief reaction 06/18/2012  . Malignant neoplasm of rectum ([place]) 03/17/2012  . GERD (gastroesophageal reflux disease)   . Elevated liver function tests 01/19/2012  . Overweight(278.02) 01/19/2012  . Elevated BP 01/19/2012  . Sun-damaged skin 01/19/2012    Expected post op course  Plan: Advance to soft diet SL MIV Ambulate JP out prior to d/c Pt educated on perineal wound maintenance  Expect he will be ready for d/c tom am Acute blood loss anemia: hgb stabilizing.  JP clearing.  Recheck hgb in am   LOS: 3 days     .Rosario Adie, [facility], [place]   08/20/2016 7:38 AM

## 2016-08-20 NOTE — Consult Note (Signed)
[place] Nurse ostomy consult note Stoma type/location: LLQ colostomy. Stomal assessment/size: 1 and 3/4 inches round, red, edematous, raised, os at center. Peristomal assessment: intact and in a gulley. Treatment options for stomal/peristomal skin: skin barrier ring Output: liquid brown effluent in a small amount. Ostomy pouching: 2pc. 2 and 3/4 inch pouch ing system with a skin barrier ring to fill parastomal gulley. This may resolve with subsiding edema Education provided: Extended session with patient and mother.  Wife is in room but unable to watch.  Patient had an ileostomy stoma 4 years ago and it was reversed. Recalls components of emptying, pouch change procedure, but is learning all of the ways a colostomy is different from an ileostomy.   We discussed the need to cut today's skin barrier off-center to allow surgical dressing over incision to remain intact. We discuss the need for constant resizing in the early weeks after surgery to ensure a correct fit. He expresses the desire to be taught irrigation when Dr. Marcello Moores approves, he is taught that ideally it is 6-8 weeks after surgery.  Patient and wife are to travel to [place] at the end of June for a month and he is concerned about having enough supply.  I will give him 3 weeks worth of supplies upon discharge and he will receive an additional 2 weeks from the [employer] Program.  I stress that his equipment is currently a 2 and 3/4 inch supply and that he may require a 2 and 1/4 inch supply after 4 weeks and he is understanding of that.  I will stress the need to establish (reestablish himself) with a supplier in the next couple of weeks. Patient can perform Lock and Roll closure and recalls how to clean the bottom 2-inches of the tail closure, but this will need to be demonstrated prior to discharge.  I will ask one of my partners to see on Friday and observe him performing a pouch change. Enrolled patient in [place]  program: Yes [place] nursing team will follow, will remain available to this patient, the nursing and medical teams.   Thanks,  Domenic Moras RN BSN [place] Pager [phone]

## 2016-08-20 NOTE — Discharge Instructions (Signed)

## 2016-08-21 DIAGNOSIS — Z933 Colostomy status: Secondary | ICD-10-CM

## 2016-08-21 LAB — CBC
HEMATOCRIT: 26.3 % — AB (ref 39.0–52.0)
Hemoglobin: 8.9 g/dL — ABNORMAL LOW (ref 13.0–17.0)
MCH: 29.8 pg (ref 26.0–34.0)
MCHC: 33.8 g/dL (ref 30.0–36.0)
MCV: 88 fL (ref 78.0–100.0)
PLATELETS: 120 10*3/uL — AB (ref 150–400)
RBC: 2.99 MIL/uL — ABNORMAL LOW (ref 4.22–5.81)
RDW: 13.7 % (ref 11.5–15.5)
WBC: 4.6 10*3/uL (ref 4.0–10.5)

## 2016-08-21 MED ORDER — HEPARIN SOD (PORK) LOCK FLUSH 100 UNIT/ML IV SOLN
500.0000 [IU] | INTRAVENOUS | Status: AC | PRN
  Administered 2016-08-21: 09:00:00 500 [IU]

## 2016-08-21 MED ORDER — OXYCODONE HCL 5 MG PO TABS: 5.0000 mg | ORAL_TABLET | ORAL | 0 refills | Status: DC | PRN

## 2016-08-21 NOTE — Progress Notes (Signed)
Patient ID: Jacob [name], male   DOB: 1970/08/01, 46 y.o.   MRN: [id number] 4 Days Post-Op   Subjective: No complaints this morning. Tolerating soft diet. Pain well controlled. Anxious to go home.  Objective: Vital signs in last 24 hours: Temp:  [98.5 F (36.9 C)-99.1 F (37.3 C)] 98.6 F (37 C) (06/02 0556) Pulse Rate:  [86-96] 86 (06/02 0556) Resp:  [15-18] 18 (06/02 0556) BP: (118-158)/(68-86) 119/69 (06/02 0556) SpO2:  [100 %] 100 % (06/02 0556) Last BM Date: 08/20/16  Intake/Output from previous day: 06/01 0701 - 06/02 0700 In: 790 [P.O.:780; I.V.:10] Out: 175 [Urine:100; Drains:75] Intake/Output this shift: No intake/output data recorded.  General appearance: alert, cooperative and no distress GI: Soft and nontender and nondistended. Stoma healthy. Incision/Wound: No erythema or drainage. JP serosanguineous.  Lab Results:   Recent Labs  08/20/16 0451 08/21/16 0431  WBC 5.9 4.6  HGB 9.2* 8.9*  HCT 27.1* 26.3*  PLT 107* 120*   BMET  Recent Labs  08/19/16 0515 08/20/16 0451  NA 136 136  K 4.3 3.8  CL 103 103  CO2 28 27  GLUCOSE 122* 110*  BUN 13 11  CREATININE 0.79 0.74  CALCIUM 8.3* 8.4*     Studies/Results: No results found.  Anti-infectives: Anti-infectives    Start     Dose/Rate Route Frequency Ordered Stop   08/17/16 0800  cefoTEtan in Dextrose 5% (CEFOTAN) 2-2.08 GM-% IVPB    Comments:  Georgena Spurling   : cabinet override      08/17/16 0800 08/17/16 2014   08/17/16 0758  ceFAZolin (ANCEF) 2-4 GM/100ML-% IVPB  Status:  Discontinued    Comments:  Georgena Spurling   : cabinet override      08/17/16 0758 08/17/16 0804      Assessment/Plan: s/p Procedure(s): LAPAROSCOPIC ABDOMINOPERINEAL RESECTION WITH PERMANENT COLOSTOMY Doing well without apparent complication. Stable for discharge. JP drain removed.   LOS: 4 days    Ras Kollman T 08/21/2016

## 2016-08-21 NOTE — Progress Notes (Signed)
Pt discharged to home; prescriptions given to patient. DC instructions/education provided to patient in detail. Pt verbalized understanding.

## 2016-08-23 NOTE — Addendum Note (Signed)
Addendum  created 08/23/16 1459 by Myrtie Soman, MD   Sign clinical note

## 2016-08-24 NOTE — Discharge Summary (Signed)
Patient ID: Jacob [name] [id number] 45 y.o. [birth year]  08/17/2016  Discharge date and time: 08/21/2016 10:38 AM  Admitting Physician: Rosario Adie  Discharge Physician: Dr Excell Seltzer  Admission Diagnoses: anal stricture  Discharge Diagnoses: same  Operations: Procedure(s): LAPAROSCOPIC ASSISTED ABDOMINOPERINEAL RESECTION WITH COLOSTOMY    Discharged Condition: good    Hospital Course: Patient was admitted after surgery.  His diet was advanced as tolerated.  He was felt to be in stable condition for discharge by POD 4.  Consults: None  Significant Diagnostic Studies: labs  Treatments: IV hydration, analgesia: Vicodin and surgery: see above  Disposition: Home

## 2016-08-25 ENCOUNTER — Telehealth: Payer: Self-pay | Admitting: *Deleted

## 2016-08-25 NOTE — Telephone Encounter (Signed)
Message from patient stating he believes he has a UTI.  Call placed back to patient and patient notified per order of Dr. Benay Spice to notify Dr. Marcello Moores office regarding possible UTI and to call [facility] back if he is not able to get in touch with Dr. Marcello Moores.  Patient appreciative of call back and states that he will contact Dr. Marcello Moores office now.

## 2016-08-27 ENCOUNTER — Telehealth: Payer: Self-pay | Admitting: *Deleted

## 2016-08-27 ENCOUNTER — Ambulatory Visit (HOSPITAL_BASED_OUTPATIENT_CLINIC_OR_DEPARTMENT_OTHER): Payer: BC Managed Care – PPO

## 2016-08-27 DIAGNOSIS — C20 Malignant neoplasm of rectum: Secondary | ICD-10-CM | POA: Diagnosis not present

## 2016-08-27 DIAGNOSIS — C787 Secondary malignant neoplasm of liver and intrahepatic bile duct: Secondary | ICD-10-CM | POA: Diagnosis not present

## 2016-08-27 LAB — URINALYSIS, MICROSCOPIC - CHCC
Bilirubin (Urine): POSITIVE
GLUCOSE UR CHCC: NEGATIVE mg/dL
Ketones: NEGATIVE mg/dL
NITRITE: NEGATIVE
Protein: 100 mg/dL
Specific Gravity, Urine: 1.03 (ref 1.003–1.035)
UROBILINOGEN UR: 0.2 mg/dL (ref 0.2–1)
pH: 6 (ref 4.6–8.0)

## 2016-08-27 MED ORDER — CIPROFLOXACIN HCL 500 MG PO TABS: 500.0000 mg | ORAL_TABLET | Freq: Two times a day (BID) | ORAL | 0 refills | Status: DC

## 2016-08-27 NOTE — Addendum Note (Signed)
Addended by: Thane Edu on: 08/27/2016 02:43 PM   Modules accepted: Orders

## 2016-08-27 NOTE — Telephone Encounter (Signed)
Call placed to patient to notify him that he does have a UTI and that Dr. Benay Spice has prescribed him to take Cipro 500 mg PO BID x 5 days.  Pt is requesting Cipro be sent to [facility] on [address].  Pt also instructed per order of Dr. Benay Spice to not see urologist and to call [facility] if UTI symptoms persist after antibiotics are complete and that if bleeding continues we will recheck his blood count.  Patient very appreciative of assistance and has no questions at this time.

## 2016-08-27 NOTE — Telephone Encounter (Signed)
Message received from triage stating that pt does not have an appt with urologist until next week and would like to know if he can come in today for a u/a.  Patient notified per order of Dr. Benay Spice to come in for a u/a and cx now.  Patient appreciative of assistance and will come to [facility] now.

## 2016-08-29 LAB — URINE CULTURE

## 2016-09-10 ENCOUNTER — Ambulatory Visit (HOSPITAL_BASED_OUTPATIENT_CLINIC_OR_DEPARTMENT_OTHER): Payer: BC Managed Care – PPO

## 2016-09-10 DIAGNOSIS — C787 Secondary malignant neoplasm of liver and intrahepatic bile duct: Secondary | ICD-10-CM | POA: Diagnosis not present

## 2016-09-10 DIAGNOSIS — C20 Malignant neoplasm of rectum: Secondary | ICD-10-CM

## 2016-09-10 DIAGNOSIS — Z95828 Presence of other vascular implants and grafts: Secondary | ICD-10-CM

## 2016-09-10 DIAGNOSIS — Z452 Encounter for adjustment and management of vascular access device: Secondary | ICD-10-CM | POA: Diagnosis not present

## 2016-09-10 MED ORDER — SODIUM CHLORIDE 0.9 % IJ SOLN
10.0000 mL | INTRAMUSCULAR | Status: DC | PRN
  Administered 2016-09-10: 10 mL via INTRAVENOUS
  Filled 2016-09-10: qty 10

## 2016-09-10 MED ORDER — HEPARIN SOD (PORK) LOCK FLUSH 100 UNIT/ML IV SOLN
500.0000 [IU] | Freq: Once | INTRAVENOUS | Status: AC | PRN
  Administered 2016-09-10: 500 [IU] via INTRAVENOUS
  Filled 2016-09-10: qty 5

## 2016-09-10 NOTE — Patient Instructions (Signed)

## 2016-10-25 ENCOUNTER — Other Ambulatory Visit (HOSPITAL_BASED_OUTPATIENT_CLINIC_OR_DEPARTMENT_OTHER): Payer: BC Managed Care – PPO

## 2016-10-25 ENCOUNTER — Ambulatory Visit: Payer: BC Managed Care – PPO

## 2016-10-25 ENCOUNTER — Ambulatory Visit (HOSPITAL_BASED_OUTPATIENT_CLINIC_OR_DEPARTMENT_OTHER): Payer: BC Managed Care – PPO | Admitting: Nurse Practitioner

## 2016-10-25 VITALS — BP 135/98 | HR 82 | Temp 98.1°F | Resp 18 | Ht 70.0 in | Wt 155.2 lb

## 2016-10-25 DIAGNOSIS — R350 Frequency of micturition: Secondary | ICD-10-CM

## 2016-10-25 DIAGNOSIS — C20 Malignant neoplasm of rectum: Secondary | ICD-10-CM

## 2016-10-25 DIAGNOSIS — C787 Secondary malignant neoplasm of liver and intrahepatic bile duct: Secondary | ICD-10-CM | POA: Diagnosis not present

## 2016-10-25 DIAGNOSIS — R609 Edema, unspecified: Secondary | ICD-10-CM

## 2016-10-25 DIAGNOSIS — R0609 Other forms of dyspnea: Secondary | ICD-10-CM | POA: Diagnosis not present

## 2016-10-25 DIAGNOSIS — Z95828 Presence of other vascular implants and grafts: Secondary | ICD-10-CM

## 2016-10-25 DIAGNOSIS — N23 Unspecified renal colic: Secondary | ICD-10-CM | POA: Diagnosis not present

## 2016-10-25 LAB — URINALYSIS, MICROSCOPIC - CHCC
BILIRUBIN (URINE): NEGATIVE
GLUCOSE UR CHCC: NEGATIVE mg/dL
Ketones: NEGATIVE mg/dL
Leukocyte Esterase: NEGATIVE
Nitrite: NEGATIVE
PH: 6 (ref 4.6–8.0)
Protein: 100 mg/dL
SPECIFIC GRAVITY, URINE: 1.02 (ref 1.003–1.035)
UROBILINOGEN UR: 0.2 mg/dL (ref 0.2–1)
WBC, UA: NEGATIVE (ref 0–?)

## 2016-10-25 LAB — COMPREHENSIVE METABOLIC PANEL
ALBUMIN: 3.4 g/dL — AB (ref 3.5–5.0)
ALK PHOS: 77 U/L (ref 40–150)
ALT: 19 U/L (ref 0–55)
ANION GAP: 6 meq/L (ref 3–11)
AST: 24 U/L (ref 5–34)
BUN: 13.8 mg/dL (ref 7.0–26.0)
CALCIUM: 9.1 mg/dL (ref 8.4–10.4)
CO2: 28 mEq/L (ref 22–29)
Chloride: 107 mEq/L (ref 98–109)
Creatinine: 0.8 mg/dL (ref 0.7–1.3)
EGFR: >90 mL/min/{1.73_m2} (ref >90)
Glucose: 88 mg/dl (ref 70–140)
POTASSIUM: 4.1 meq/L (ref 3.5–5.1)
Sodium: 140 mEq/L (ref 136–145)
Total Bilirubin: 0.34 mg/dL (ref 0.20–1.20)
Total Protein: 6.7 g/dL (ref 6.4–8.3)

## 2016-10-25 LAB — CEA (IN HOUSE-CHCC): CEA (CHCC-IN HOUSE): 150.98 ng/mL — AB (ref 0.00–5.00)

## 2016-10-25 LAB — CBC WITH DIFFERENTIAL/PLATELET
BASO%: 0.5 % (ref 0.0–2.0)
Basophils Absolute: 0 10*3/uL (ref 0.0–0.1)
EOS%: 3.4 % (ref 0.0–7.0)
Eosinophils Absolute: 0.1 10*3/uL (ref 0.0–0.5)
HCT: 32.3 % — ABNORMAL LOW (ref 38.4–49.9)
HGB: 10.3 g/dL — ABNORMAL LOW (ref 13.0–17.1)
LYMPH%: 19.9 % (ref 14.0–49.0)
MCH: 26.5 pg — AB (ref 27.2–33.4)
MCHC: 31.9 g/dL — AB (ref 32.0–36.0)
MCV: 83.1 fL (ref 79.3–98.0)
MONO#: 0.3 10*3/uL (ref 0.1–0.9)
MONO%: 10 % (ref 0.0–14.0)
NEUT%: 66.2 % (ref 39.0–75.0)
NEUTROS ABS: 2 10*3/uL (ref 1.5–6.5)
PLATELETS: 179 10*3/uL (ref 140–400)
RBC: 3.89 10*6/uL — ABNORMAL LOW (ref 4.20–5.82)
RDW: 16.5 % — ABNORMAL HIGH (ref 11.0–14.6)
WBC: 3.1 10*3/uL — AB (ref 4.0–10.3)
lymph#: 0.6 10*3/uL — ABNORMAL LOW (ref 0.9–3.3)

## 2016-10-25 MED ORDER — SODIUM CHLORIDE 0.9 % IJ SOLN
10.0000 mL | INTRAMUSCULAR | Status: DC | PRN
Start: 2016-10-25 — End: 2016-10-25
  Administered 2016-10-25: 10 mL via INTRAVENOUS
  Filled 2016-10-25: qty 10

## 2016-10-25 MED ORDER — HEPARIN SOD (PORK) LOCK FLUSH 100 UNIT/ML IV SOLN
500.0000 [IU] | Freq: Once | INTRAVENOUS | Status: AC | PRN
  Administered 2016-10-25: 500 [IU] via INTRAVENOUS
  Filled 2016-10-25: qty 5

## 2016-10-25 NOTE — Progress Notes (Addendum)
[facility] OFFICE PROGRESS NOTE   Diagnosis:  Rectal cancer  INTERVAL HISTORY:   Jacob [name] returns as scheduled. On 08/17/2016 he underwent laparoscopic abdominoperineal resection. Colostomy is functioning normally. He takes Colace daily. He states that overall he feels like "crap". He developed hoarseness about 1 week after surgery. This has persisted. He returned from [place] a week ago. He reports his incision became "infected" while there. He also developed recurrent urinary symptoms with frequency and "pressure, discomfort". He completed an antibiotic. The urinary symptoms have persisted. He has developed progressive dyspnea on exertion. He notes bilateral lower leg swelling. He thinks he is having some anxiety.  Objective:  Vital signs in last 24 hours:  Blood pressure (!) 135/98, pulse 82, temperature 98.1 F (36.7 C), temperature source Oral, resp. rate 18, height _0  (1.778 m), weight 155 lb 3.2 oz (70.4 kg), SpO2 100 %.    HEENT: No thrush or ulcers. Lymphatics: No palpable cervical, supraclavicular, axillary or inguinal lymph nodes. Resp: Lungs clear bilaterally. No respiratory distress. Cardio: Regular rate and rhythm. GI: No hepatomegaly. Well-healed surgical incision. Left lower quadrant colostomy. Vascular: Trace pitting edema at the lower legs bilaterally. Neuro: Alert and oriented. Voice is hoarse. Port-A-Cath without erythema.   Lab Results:  Lab Results  Component Value Date   WBC 3.1 (L) 10/25/2016   HGB 10.3 (L) 10/25/2016   HCT 32.3 (L) 10/25/2016   MCV 83.1 10/25/2016   PLT 179 10/25/2016   NEUTROABS 2.0 10/25/2016    Imaging:  No results found.  Medications: I have reviewed the patient's current medications.  Assessment/Plan: 1. Rectal cancer. Partially obstructing mass noted 1-2 cm from the anal verge on a colonoscopy 03/06/2012. Endoscopic ultrasound 03/14/2012 with a 7.5 mm thick, 3.2 cm wide hypoechoic, irregularly bordered  mass that clearly passed into and through the muscularis propria layer of the distal rectal wall (uT3); 3 small (largest 7 mm) perirectal lymph nodes. The lymph nodes were all round, discrete, hypoechoic, homogenous; suspicious for malignant involvement (uN1).  No RAS mutation identified by [facility] 1 testing on the colon resection specimen 06/29/2012, APC alteration identified, microsatellite stable  He began radiation and concurrent Xeloda chemotherapy on 03/20/2012, completed 04/27/2012.   Low anterior resection/coloanal anastomosis and diverting ileostomy 06/29/2012 with the final pathology revealing a T2N0 tumor with extensive fibrosis and negative margins.   Cycle 1 of adjuvant CAPOX 07/19/2012. Cycle 5 of adjuvant CAPOX 10/11/2012.   CEA 2.5 on 11/14/2012.   CEA 14.6 03/08/2013.   Restaging CT evaluation 03/08/2013 with a 4 mm pulmonary nodule left lung base not identified on comparison exam; new liver lesions including a 29 x 26 mm irregular peripheral enhancing rounded lesion in the dome of the right hepatic lobe, a less well-defined new subcapsular lesion in the lateral right hepatic lobe measuring 12 mm, a new subcapsular lesion in the anterior right hepatic lobe adjacent to the gallbladder fossa measuring 10 mm; a rounded low-density lesion in the inferior right hepatic lobe measuring 10 mm compared to 7 mm on the prior study (radiologist commented this may represent an enlarged cyst).   MRI of the abdomen 04/12/2013 confirmed multiple T2 hyperintense metastatic lesions throughout the right liver   Initiation of FOLFIRI/Avastin with genotype based irinotecan dosing per the [facility] study 04/05/2013.   Restaging CT evaluation on 06/20/2013 (after 2 cycles/4 treatments) showed improvement in the liver metastases and stable size of a left lower lobe pulmonary nodule now with central cavitation.   Continuation of FOLFIRI/Avastin.  Restaging CT evaluation 08/14/2013-decrease in  the size of liver metastases, slight decrease in the size of a cavitary left lower lobe nodule, no evidence of disease progression.   Status post right hepatic lobectomy 09/17/2013. Pathology showed multiple foci of metastatic adenocarcinoma (5 nodules of metastatic adenocarcinoma 4 of which are subcapsular with the nodules ranging in size from 0.6-1.8 cm in greatest dimension). Margins not involved. Biopsy of a portal lymph node showed benign adipose tissue; no lymph node tissue or malignancy.   Normal CEA 11/06/2013   CT 11/06/2013 with a new pleural-based right lower chest lesion, slight in enlargement of the a left-sided lung nodule  CT 01/29/2014 with a decrease in the right lower chest pleural-based lesion and a slight increase of a left-sided lung lesion, other lung lesions were stable  CT chest 05/02/2014 with a decrease in the size of a right lower lobe pulmonary nodule months similar size of a dominant left lower lobe nodule, minimal enlargement of a smaller left lower lobe nodule, no new site of disease  CT chest 11/07/2014 with a slight increase in several left-sided lung nodules  CT chest 02/10/2015 with new small left hilar lymph nodes, a possible new left lower lobe nodule, and stability of other lung nodules  PET scan 03/12/2015 with hypermetabolic left hilar nodes, hypermetabolic left lower lobe nodules, hyper metabolic retroperitoneal nodes, intense hypermetabolism at the coloanal anastomosis, and hypermetabolic thickening in the presacral space  Cycle 1 FOLFIRI/PANITUMUMAB 05/21/2015  Cycle 2 FOLFIRI/PANITUMUMAB 06/05/2015  Cycle 3 FOLFIRI/PANITUMUMAB 06/19/2015  Cycle 4 FOLFIRI 07/10/2015  Cycle 5 FOLFIRI 07/24/2015  Restaging CTs 08/06/2015-resolution of hilar/retroperitoneal adenopathy, improvement in the hypermetabolic lung nodule, other lung nodules are stable, no new lesions  Cycle 6 FOLFIRI with PANITUMUMAB 08/28/2015 -panitumumab dose reduced  Cycle 7  FOLFIRI 09/11/2015-no panitumumab given  Cycle 8 FOLFIRI with PANITUMUMAB 09/25/2015-PANITUMUMAB dose reduced  Cycle 9 FOLFIRI 10/09/2015-no PANITUMUMAB given  Cycle 10 FOLFIRI with panitumumab 10/23/2015  CTs 11/06/2015-possible slight enlargement of a left lower lobe nodule, no other evidence of disease progression.  CTs 02/16/2016-enlargement of left-sided pulmonary nodules and mediastinal/left hilar nodes, improved splenomegaly  CTs 06/21/2016-increased size of mediastinal/left hilar nodes, increased left lung nodules, and increased soft tissue at the porta hepatis 2. Irregular bowel habits/rectal bleeding secondary to #1. 3. History of Mild elevation of the liver enzymes. Question secondary to hepatic steatosis. 4. Indeterminate 8 mm posterior right liver lesion on the staging CT 03/06/2012. 5. Mildly elevated CEA at 5.7 on 03/06/2012. 6. History of radiation erythema at the groin and perineum 7. 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 No clinical evidence of thrombophlebitis or extravasation. 8. Delayed nausea following chemotherapy-Decadron prophylaxis was added with cycle 3 CAPOX-improved. 9. Ileostomy takedown 12/07/2012. 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 11. Port-A-Cath placement 12/31/204 12. Severe neutropenia secondary to chemotherapy following cycle 1 of FOLFIRI, chemotherapy was dose reduced and he received Neulasta with day 15 cycle 1  13. Nausea and vomiting following cycle 1 of FOLFIRI 14. Rectal stricture-manual/colonoscopic dilatation by Dr. Ardis Hughs 03/01/2016  APR 08/17/2016-no evidence of malignancy 15. Leukopenia/Thrombocytopenia-persistent, potentially a sequelae of chemotherapy or hepatic toxicity from chemotherapy/radiation. Bone marrow biopsy 11/20/2014 showed cellular bone marrow with trilineage hematopoiesis. Significant dyspoiesis was not present and there was no  evidence of metastatic carcinoma. Cytogenetic analysis showed the presence of normal male chromosomes with no observable clonal chromosomal abnormalities.  probable cirrhosis 16. Genetic testing-negative genetic panel in March 2014 17. 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 Severe  over the face-steroid Dosepak prescribed 07/03/2015. Improved 07/10/2015. Further improved 07/24/2015, 08/07/2015, 08/28/2015 18. Diarrhea secondary to chemotherapy-encouraged to use Imodium 07/03/2015 19. History of Paronychia secondary to PANITUMUMAB    Disposition: Jacob [name] presents for routine follow-up today with multiple complaints. We discussed our concern that at least some of the issues he is experiencing may be related to progression of the rectal cancer. He is agreeable to proceed with restaging CT scans. We will try to get the scans done this week and see him in follow-up on 10/29/2016 to review the results.  He will submit a urine sample to evaluate complaints of frequency and discomfort.  Patient seen with Dr. Benay [name].  Ned Card ANP/GNP-BC   10/25/2016  4:08 PM  This was a shared visit with Ned Card. Jacob [name] will undergo restaging CT scans and return for an office visit later this week. We discussed the likelihood that we will recommend resuming irinotecan/panitumumab therapy.  Julieanne Manson, M.D.

## 2016-10-26 LAB — URINE CULTURE

## 2016-10-28 ENCOUNTER — Ambulatory Visit (HOSPITAL_COMMUNITY)
Admission: RE | Admit: 2016-10-28 | Discharge: 2016-10-28 | Disposition: A | Discharge status: Home / Self Care | Payer: BC Managed Care – PPO | Source: Ambulatory Visit | Attending: Nurse Practitioner | Admitting: Nurse Practitioner

## 2016-10-28 DIAGNOSIS — J984 Other disorders of lung: Secondary | ICD-10-CM | POA: Diagnosis not present

## 2016-10-28 DIAGNOSIS — Z9049 Acquired absence of other specified parts of digestive tract: Secondary | ICD-10-CM | POA: Insufficient documentation

## 2016-10-28 DIAGNOSIS — C20 Malignant neoplasm of rectum: Secondary | ICD-10-CM | POA: Diagnosis not present

## 2016-10-28 DIAGNOSIS — R188 Other ascites: Secondary | ICD-10-CM | POA: Insufficient documentation

## 2016-10-28 DIAGNOSIS — C787 Secondary malignant neoplasm of liver and intrahepatic bile duct: Secondary | ICD-10-CM | POA: Insufficient documentation

## 2016-10-28 DIAGNOSIS — R918 Other nonspecific abnormal finding of lung field: Secondary | ICD-10-CM | POA: Insufficient documentation

## 2016-10-28 DIAGNOSIS — Z933 Colostomy status: Secondary | ICD-10-CM | POA: Diagnosis not present

## 2016-10-28 MED ORDER — IOPAMIDOL (ISOVUE-300) INJECTION 61%
100.0000 mL | Freq: Once | INTRAVENOUS | Status: AC | PRN
  Administered 2016-10-28: 100 mL via INTRAVENOUS

## 2016-10-28 MED ORDER — IOPAMIDOL (ISOVUE-300) INJECTION 61%
INTRAVENOUS | Status: AC
  Filled 2016-10-28: qty 100

## 2016-10-29 ENCOUNTER — Ambulatory Visit (HOSPITAL_BASED_OUTPATIENT_CLINIC_OR_DEPARTMENT_OTHER): Payer: BC Managed Care – PPO | Admitting: Nurse Practitioner

## 2016-10-29 VITALS — BP 134/83 | HR 68 | Temp 98.0°F | Resp 18 | Ht 70.0 in | Wt 162.2 lb

## 2016-10-29 DIAGNOSIS — R6884 Jaw pain: Secondary | ICD-10-CM

## 2016-10-29 DIAGNOSIS — R609 Edema, unspecified: Secondary | ICD-10-CM

## 2016-10-29 DIAGNOSIS — C20 Malignant neoplasm of rectum: Secondary | ICD-10-CM | POA: Diagnosis not present

## 2016-10-29 DIAGNOSIS — R490 Dysphonia: Secondary | ICD-10-CM

## 2016-10-29 DIAGNOSIS — C787 Secondary malignant neoplasm of liver and intrahepatic bile duct: Secondary | ICD-10-CM

## 2016-10-29 DIAGNOSIS — R350 Frequency of micturition: Secondary | ICD-10-CM

## 2016-10-29 MED ORDER — MINOCYCLINE HCL 100 MG PO CAPS: 100.0000 mg | ORAL_CAPSULE | Freq: Two times a day (BID) | ORAL | 2 refills | Status: DC

## 2016-10-29 MED ORDER — LIDOCAINE-PRILOCAINE 2.5-2.5 % EX CREA: 1.0000 "application " | TOPICAL_CREAM | CUTANEOUS | 3 refills | Status: DC | PRN

## 2016-10-29 MED ORDER — ALPRAZOLAM 0.25 MG PO TABS: 0.2500 mg | ORAL_TABLET | Freq: Every evening | ORAL | 1 refills | Status: DC | PRN

## 2016-10-29 NOTE — Progress Notes (Addendum)
Jacob [name] OFFICE PROGRESS NOTE   Diagnosis:  Rectal cancer  INTERVAL HISTORY:   Jacob [name] returns as scheduled. He continues to have urinary frequency and "pressure". He occasionally notes blood with urination. He has persistent mild scrotal/penile edema. His continues to be hoarse. He denies fever. He has an occasional cough. No shortness of breath. He has intermittent pain in his "jaw and head". He wonders if this is related to anxiety.  Objective:  Vital signs in last 24 hours:  Blood pressure 134/83, pulse 68, temperature 98 F (36.7 C), temperature source Oral, resp. rate 18, height '5\' 10"'  (1.778 m), weight 162 lb 3.2 oz (73.6 kg), SpO2 100 %.    Lymphatics: Small left posterior cervical lymph node. Resp: Lungs clear bilaterally. Cardio: Regular rate and rhythm. Vascular: Pitting edema at the lower legs bilaterally. Neuro: Voice is hoarse.    Lab Results:  Lab Results  Component Value Date   WBC 3.1 (L) 10/25/2016   HGB 10.3 (L) 10/25/2016   HCT 32.3 (L) 10/25/2016   MCV 83.1 10/25/2016   PLT 179 10/25/2016   NEUTROABS 2.0 10/25/2016    Imaging:  Ct Chest W Contrast  Result Date: 10/28/2016 CLINICAL DATA:  Metastatic rectal cancer restaging. EXAM: CT CHEST, ABDOMEN, AND PELVIS WITH CONTRAST TECHNIQUE: Multidetector CT imaging of the chest, abdomen and pelvis was performed following the standard protocol during bolus administration of intravenous contrast. CONTRAST:  152m ISOVUE-300 IOPAMIDOL (ISOVUE-300) INJECTION 61% COMPARISON:  CT scan 06/21/2016 and PET-CT 03/12/2015. FINDINGS: CT CHEST FINDINGS Cardiovascular: The heart is normal in size. No pericardial effusion. Small mild fluid in the pericardial recesses. The aorta is normal in caliber. The branch vessels are patent. No definite coronary artery calcifications. The left Port-A-Cath tip is in the distal SVC. Mediastinum/Nodes: The necrotic appearing left hilar mass has enlarged since the prior CT  scan. It is now a confluent lesion measuring 6.2 x 4.1 cm on image number 23. It previously measured approximately 4.4 x 1.9 cm. The esophagus is grossly normal. Lungs/Pleura: Enlarging left lower lobe pulmonary lesion measuring approximately 16 x 11 mm on image number 80 measures 9.5 x 6.5 mm and previously measured 10.5 x 9.5 mm. Left lower lobe pulmonary nodule on image number 96 measures 9.5 x 6.5 mm and previously measured 9.0 x 5.5 mm. New left upper lobe airspace opacity has the appearance of an infiltrate. No pleural effusion. Musculoskeletal: No significant bony findings. No obvious osseous metastatic disease. CT ABDOMEN PELVIS FINDINGS Hepatobiliary: 2.8 x 2.7 cm segment 7 liver lesion on image 40. I do not see this for certain on the prior CT scan. Large right upper quadrant necrotic appearing nodal mass measures 11.4 x 5.4 cm and previously measured 7.4 x 3.4 cm. There is mass effect on the liver ADeneise Levercould be invading the liver. There is mass effect and compression of the intrahepatic IVC. The portal vein is patent. However, there is nonocclusive clot noted at the portal splenic confluence. Pancreas: No mass, inflammation or ductal dilatation. Spleen: Normal size.  No focal lesions. Adrenals/Urinary Tract: The right adrenal gland is obscured by the right upper quadrant mass. The left adrenal gland appears normal. Is also mass effect on the right kidney but no renal lesions or hydronephrosis. Stomach/Bowel: The stomach, duodenum, small bowel and colon are grossly normal. The patient has had surgery since the prior CT scan. The rectum has been resected. There is also a left lower quadrant colostomy with a small parastomal hernia containing moderate  fluid and vessels. Vascular/Lymphatic: The aorta and branch vessels are patent. No mesenteric or retroperitoneal mass or adenopathy. Reproductive: The prostate gland and seminal vesicles are grossly normal. Persistent significant presacral soft tissue  thickening is likely postoperative change. Is also moderate free pelvic fluid. Small nodule in the midline between the seminal vesicles measures 10 mm and could be a lymph node. Other: No inguinal mass, adenopathy or hernia. Musculoskeletal: No significant bony findings. IMPRESSION: 1. Progressive disease in the chest and abdomen with an enlarging left hilar mass and slight interval enlargement of pulmonary lesions. The right upper quadrant necrotic nodal mass has significantly increased in size and there is mass effect on the liver and possible invasion. There is also significant compression of the intrahepatic IVC. 2. New right hepatic lobe metastatic lesion. 3. Left upper low airspace process is likely infiltrate. 4. Nonocclusive clot near the portal splenic confluence. 5. Interval surgical changes in the pelvis with resection of the rectum and a left lower quadrant colostomy. 6. Moderate pelvic ascites. 7. Chronic presacral soft tissue thickening, likely postoperative change. The These results will be called to the ordering clinician or representative by the Radiologist Assistant, and communication documented in the PACS or zVision Dashboard. Electronically Signed   By: Marijo Sanes M.D.   On: 10/28/2016 14:50   Ct Abdomen Pelvis W Contrast  Result Date: 10/28/2016 CLINICAL DATA:  Metastatic rectal cancer restaging. EXAM: CT CHEST, ABDOMEN, AND PELVIS WITH CONTRAST TECHNIQUE: Multidetector CT imaging of the chest, abdomen and pelvis was performed following the standard protocol during bolus administration of intravenous contrast. CONTRAST:  141m ISOVUE-300 IOPAMIDOL (ISOVUE-300) INJECTION 61% COMPARISON:  CT scan 06/21/2016 and PET-CT 03/12/2015. FINDINGS: CT CHEST FINDINGS Cardiovascular: The heart is normal in size. No pericardial effusion. Small mild fluid in the pericardial recesses. The aorta is normal in caliber. The branch vessels are patent. No definite coronary artery calcifications. The left  Port-A-Cath tip is in the distal SVC. Mediastinum/Nodes: The necrotic appearing left hilar mass has enlarged since the prior CT scan. It is now a confluent lesion measuring 6.2 x 4.1 cm on image number 23. It previously measured approximately 4.4 x 1.9 cm. The esophagus is grossly normal. Lungs/Pleura: Enlarging left lower lobe pulmonary lesion measuring approximately 16 x 11 mm on image number 80 measures 9.5 x 6.5 mm and previously measured 10.5 x 9.5 mm. Left lower lobe pulmonary nodule on image number 96 measures 9.5 x 6.5 mm and previously measured 9.0 x 5.5 mm. New left upper lobe airspace opacity has the appearance of an infiltrate. No pleural effusion. Musculoskeletal: No significant bony findings. No obvious osseous metastatic disease. CT ABDOMEN PELVIS FINDINGS Hepatobiliary: 2.8 x 2.7 cm segment 7 liver lesion on image 40. I do not see this for certain on the prior CT scan. Large right upper quadrant necrotic appearing nodal mass measures 11.4 x 5.4 cm and previously measured 7.4 x 3.4 cm. There is mass effect on the liver ADeneise Levercould be invading the liver. There is mass effect and compression of the intrahepatic IVC. The portal vein is patent. However, there is nonocclusive clot noted at the portal splenic confluence. Pancreas: No mass, inflammation or ductal dilatation. Spleen: Normal size.  No focal lesions. Adrenals/Urinary Tract: The right adrenal gland is obscured by the right upper quadrant mass. The left adrenal gland appears normal. Is also mass effect on the right kidney but no renal lesions or hydronephrosis. Stomach/Bowel: The stomach, duodenum, small bowel and colon are grossly normal. The patient has  had surgery since the prior CT scan. The rectum has been resected. There is also a left lower quadrant colostomy with a small parastomal hernia containing moderate fluid and vessels. Vascular/Lymphatic: The aorta and branch vessels are patent. No mesenteric or retroperitoneal mass or adenopathy.  Reproductive: The prostate gland and seminal vesicles are grossly normal. Persistent significant presacral soft tissue thickening is likely postoperative change. Is also moderate free pelvic fluid. Small nodule in the midline between the seminal vesicles measures 10 mm and could be a lymph node. Other: No inguinal mass, adenopathy or hernia. Musculoskeletal: No significant bony findings. IMPRESSION: 1. Progressive disease in the chest and abdomen with an enlarging left hilar mass and slight interval enlargement of pulmonary lesions. The right upper quadrant necrotic nodal mass has significantly increased in size and there is mass effect on the liver and possible invasion. There is also significant compression of the intrahepatic IVC. 2. New right hepatic lobe metastatic lesion. 3. Left upper low airspace process is likely infiltrate. 4. Nonocclusive clot near the portal splenic confluence. 5. Interval surgical changes in the pelvis with resection of the rectum and a left lower quadrant colostomy. 6. Moderate pelvic ascites. 7. Chronic presacral soft tissue thickening, likely postoperative change. The These results will be called to the ordering clinician or representative by the Radiologist Assistant, and communication documented in the PACS or zVision Dashboard. Electronically Signed   By: Marijo Sanes M.D.   On: 10/28/2016 14:50    Medications: I have reviewed the patient's current medications.  Assessment/Plan: 1. Rectal cancer. Partially obstructing mass noted 1-2 cm from the anal verge on a colonoscopy 03/06/2012. Endoscopic ultrasound 03/14/2012 with a 7.5 mm thick, 3.2 cm wide hypoechoic, irregularly bordered mass that clearly passed into and through the muscularis propria layer of the distal rectal wall (uT3); 3 small (largest 7 mm) perirectal lymph nodes. The lymph nodes were all round, discrete, hypoechoic, homogenous; suspicious for malignant involvement (uN1).  No RAS mutation identified by  [facility] 1 testing on the colon resection specimen 06/29/2012, APC alteration identified, microsatellite stable  He began radiation and concurrent Xeloda chemotherapy on 03/20/2012, completed 04/27/2012.   Low anterior resection/coloanal anastomosis and diverting ileostomy 06/29/2012 with the final pathology revealing a T2N0 tumor with extensive fibrosis and negative margins.   Cycle 1 of adjuvant CAPOX 07/19/2012. Cycle 5 of adjuvant CAPOX 10/11/2012.   CEA 2.5 on 11/14/2012.   CEA 14.6 03/08/2013.   Restaging CT evaluation 03/08/2013 with a 4 mm pulmonary nodule left lung base not identified on comparison exam; new liver lesions including a 29 x 26 mm irregular peripheral enhancing rounded lesion in the dome of the right hepatic lobe, a less well-defined new subcapsular lesion in the lateral right hepatic lobe measuring 12 mm, a new subcapsular lesion in the anterior right hepatic lobe adjacent to the gallbladder fossa measuring 10 mm; a rounded low-density lesion in the inferior right hepatic lobe measuring 10 mm compared to 7 mm on the prior study (radiologist commented this may represent an enlarged cyst).   MRI of the abdomen 04/12/2013 confirmed multiple T2 hyperintense metastatic lesions throughout the right liver   Initiation of FOLFIRI/Avastin with genotype based irinotecan dosing per the [facility] study 04/05/2013.   Restaging CT evaluation on 06/20/2013 (after 2 cycles/4 treatments) showed improvement in the liver metastases and stable size of a left lower lobe pulmonary nodule now with central cavitation.   Continuation of FOLFIRI/Avastin.   Restaging CT evaluation 08/14/2013-decrease in the size of liver metastases, slight  decrease in the size of a cavitary left lower lobe nodule, no evidence of disease progression.   Status post right hepatic lobectomy 09/17/2013. Pathology showed multiple foci of metastatic adenocarcinoma (5 nodules of metastatic adenocarcinoma 4 of  which are subcapsular with the nodules ranging in size from 0.6-1.8 cm in greatest dimension). Margins not involved. Biopsy of a portal lymph node showed benign adipose tissue; no lymph node tissue or malignancy.   Normal CEA 11/06/2013   CT 11/06/2013 with a new pleural-based right lower chest lesion, slight in enlargement of the a left-sided lung nodule  CT 01/29/2014 with a decrease in the right lower chest pleural-based lesion and a slight increase of a left-sided lung lesion, other lung lesions were stable  CT chest 05/02/2014 with a decrease in the size of a right lower lobe pulmonary nodule months similar size of a dominant left lower lobe nodule, minimal enlargement of a smaller left lower lobe nodule, no new site of disease  CT chest 11/07/2014 with a slight increase in several left-sided lung nodules  CT chest 02/10/2015 with new small left hilar lymph nodes, a possible new left lower lobe nodule, and stability of other lung nodules  PET scan 03/12/2015 with hypermetabolic left hilar nodes, hypermetabolic left lower lobe nodules, hyper metabolic retroperitoneal nodes, intense hypermetabolism at the coloanal anastomosis, and hypermetabolic thickening in the presacral space  Cycle 1 FOLFIRI/PANITUMUMAB 05/21/2015  Cycle 2 FOLFIRI/PANITUMUMAB 06/05/2015  Cycle 3 FOLFIRI/PANITUMUMAB 06/19/2015  Cycle 4 FOLFIRI 07/10/2015  Cycle 5 FOLFIRI 07/24/2015  Restaging CTs 08/06/2015-resolution of hilar/retroperitoneal adenopathy, improvement in the hypermetabolic lung nodule, other lung nodules are stable, no new lesions  Cycle 6 FOLFIRI with PANITUMUMAB 08/28/2015 -panitumumab dose reduced  Cycle 7 FOLFIRI 09/11/2015-no panitumumab given  Cycle 8 FOLFIRI with PANITUMUMAB 09/25/2015-PANITUMUMAB dose reduced  Cycle 9 FOLFIRI 10/09/2015-no PANITUMUMAB given  Cycle 10 FOLFIRI with panitumumab 10/23/2015  CTs 11/06/2015-possible slight enlargement of a left lower lobe nodule, no  other evidence of disease progression.  CTs 02/16/2016-enlargement of left-sided pulmonary nodules and mediastinal/left hilar nodes, improved splenomegaly  CTs 06/21/2016-increased size of mediastinal/left hilar nodes, increased left lung nodules, and increased soft tissue at the porta hepatis  CTs 10/28/2016-progressive disease in the chest and abdomen with an enlarging left hilar mass and slight interval enlargement of pulmonary lesions. Right upper quadrant necrotic nodal mass significantly increased in size with mass effect on the liver and possible invasion. Significant compression of the intrahepatic IVC. New right hepatic lobe lesion. Moderate pelvic ascites. 2. Irregular bowel habits/rectal bleeding secondary to #1. 3. History of Mild elevation of the liver enzymes. Question secondary to hepatic steatosis. 4. Indeterminate 8 mm posterior right liver lesion on the staging CT 03/06/2012. 5. Mildly elevated CEA at 5.7 on 03/06/2012. 6. History of radiation erythema at the groin and perineum 7. 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 No clinical evidence of thrombophlebitis or extravasation. 8. Delayed nausea following chemotherapy-Decadron prophylaxis was added with cycle 3 CAPOX-improved. 9. Ileostomy takedown 12/07/2012. 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 11. Port-A-Cath placement 12/31/204 12. Severe neutropenia secondary to chemotherapy following cycle 1 of FOLFIRI, chemotherapy was dose reduced and he received Neulasta with day 15 cycle 1  13. Nausea and vomiting following cycle 1 of FOLFIRI 14. Rectal stricture-manual/colonoscopic dilatation by Dr. Ardis Hughs 03/01/2016  APR 08/17/2016-no evidence of malignancy 15. Leukopenia/Thrombocytopenia-persistent, potentially a sequelae of chemotherapy or hepatic toxicity from chemotherapy/radiation. Bone marrow biopsy 11/20/2014 showed cellular bone  marrow with trilineage hematopoiesis. Significant  dyspoiesis was not present and there was no evidence of metastatic carcinoma. Cytogenetic analysis showed the presence of normal male chromosomes with no observable clonal chromosomal abnormalities.  probable cirrhosis 16. Genetic testing-negative genetic panel in March 2014 17. 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 Severe over the face-steroid Dosepak prescribed 07/03/2015. Improved 07/10/2015. Further improved 07/24/2015, 08/07/2015, 08/28/2015 18. Diarrhea secondary to chemotherapy-encouraged to use Imodium 07/03/2015 19. History of Paronychia secondary to PANITUMUMAB    Disposition: Mr. Salvato appears unchanged. Dr. Benay Spice reviewed the CT report/images with Mr. Chien and his wife. They understand there is evidence of progression of the rectal cancer. Dr. Benay Spice recommends resuming treatment with FOLFIRI/PANITUMUMAB. We reviewed potential toxicities including bone marrow toxicity, nausea, mouth sores, diarrhea, hair loss. He had a severe skin rash with PANITUMUMAB in the past. The rash improved with adjustment of the PANITUMUMAB dose. He will begin minocycline twice daily.  The chest CT showed new left upper lobe airspace opacity with the appearance of an infiltrate. He does not have symptoms suggestive of pneumonia. He understands to contact the office with fever, chills, other signs of infection.  The etiology of the urinary symptoms is not clear. We made a referral to urology.  He understands the hoarseness may be related to compression of the recurrent laryngeal nerve. If there is no improvement with treatment of the cancer we will make a referral to ENT.  He will return for cycle 1 FOLFIRI/PANITUMUMAB 11/04/2016. We will see him in follow-up prior to cycle 2 on 11/18/2016. He will contact the office in the interim with any problems.   Patient seen with Dr. Benay Spice. 25 minutes were spent face-to-face at today's visit with the majority of  that time involved in counseling/coordination of care.    Ned Card ANP/GNP-BC   10/29/2016  11:53 AM  This was a shared visit with Ned Card. Mr. Swaim has progressive metastatic rectal cancer. We reviewed the restaging CT images with Mr. Minnie and his wife. I recommend FOLFIRI/panitumumab therapy. We reviewed the potential toxicities associated with this regimen including the chance of a skin rash. He agrees to proceed.  The plan is to begin FOLFIRI/panitumumab 11/04/2016.  Julieanne Manson, M.D.

## 2016-11-01 ENCOUNTER — Telehealth: Payer: Self-pay | Admitting: Nurse Practitioner

## 2016-11-01 NOTE — Telephone Encounter (Signed)
Scheduled appt per 8/10 los - patient is aware of appts and will pick up a new schedule next visit. Referral order faxed to [facility].

## 2016-11-04 ENCOUNTER — Ambulatory Visit (HOSPITAL_BASED_OUTPATIENT_CLINIC_OR_DEPARTMENT_OTHER): Payer: BC Managed Care – PPO

## 2016-11-04 ENCOUNTER — Other Ambulatory Visit: Payer: Self-pay | Admitting: Nurse Practitioner

## 2016-11-04 DIAGNOSIS — C787 Secondary malignant neoplasm of liver and intrahepatic bile duct: Secondary | ICD-10-CM | POA: Diagnosis not present

## 2016-11-04 DIAGNOSIS — Z5111 Encounter for antineoplastic chemotherapy: Secondary | ICD-10-CM

## 2016-11-04 DIAGNOSIS — C20 Malignant neoplasm of rectum: Secondary | ICD-10-CM | POA: Diagnosis not present

## 2016-11-04 DIAGNOSIS — Z5112 Encounter for antineoplastic immunotherapy: Secondary | ICD-10-CM

## 2016-11-04 MED ORDER — PROCHLORPERAZINE MALEATE 10 MG PO TABS: 10.0000 mg | ORAL_TABLET | Freq: Four times a day (QID) | ORAL | 0 refills | Status: DC | PRN

## 2016-11-04 MED ORDER — SODIUM CHLORIDE 0.9 % IV SOLN
Freq: Once | INTRAVENOUS | Status: AC
  Administered 2016-11-04: 15:00:00 via INTRAVENOUS
  Filled 2016-11-04: qty 5

## 2016-11-04 MED ORDER — SODIUM CHLORIDE 0.9 % IV SOLN
Freq: Once | INTRAVENOUS | Status: AC
  Administered 2016-11-04: 15:00:00 via INTRAVENOUS

## 2016-11-04 MED ORDER — PALONOSETRON HCL INJECTION 0.25 MG/5ML
INTRAVENOUS | Status: AC
  Filled 2016-11-04: qty 5

## 2016-11-04 MED ORDER — IRINOTECAN HCL CHEMO INJECTION 100 MG/5ML
135.0000 mg/m2 | Freq: Once | INTRAVENOUS | Status: AC
  Administered 2016-11-04: 260 mg via INTRAVENOUS
  Filled 2016-11-04: qty 5

## 2016-11-04 MED ORDER — ATROPINE SULFATE 1 MG/ML IJ SOLN: 0.5000 mg | Freq: Once | INTRAMUSCULAR | Status: DC | PRN

## 2016-11-04 MED ORDER — PALONOSETRON HCL INJECTION 0.25 MG/5ML
0.2500 mg | Freq: Once | INTRAVENOUS | Status: AC
  Administered 2016-11-04: 0.25 mg via INTRAVENOUS

## 2016-11-04 MED ORDER — SODIUM CHLORIDE 0.9 % IV SOLN
2400.0000 mg/m2 | INTRAVENOUS | Status: DC
  Administered 2016-11-04: 4600 mg via INTRAVENOUS
  Filled 2016-11-04: qty 92

## 2016-11-04 MED ORDER — SODIUM CHLORIDE 0.9 % IV SOLN
3.0000 mg/kg | Freq: Once | INTRAVENOUS | Status: AC
  Administered 2016-11-04: 220 mg via INTRAVENOUS
  Filled 2016-11-04: qty 11

## 2016-11-04 MED ORDER — LEUCOVORIN CALCIUM INJECTION 350 MG
400.0000 mg/m2 | Freq: Once | INTRAVENOUS | Status: AC
  Administered 2016-11-04: 764 mg via INTRAVENOUS
  Filled 2016-11-04: qty 38.2

## 2016-11-04 NOTE — Patient Instructions (Signed)
[place] Discharge Instructions for Patients Receiving Chemotherapy  Today you received the following chemotherapy agents Vectibix,Camptosar, Leucovorin, Adrucil  To help prevent nausea and vomiting after your treatment, we encourage you to take your nausea medication   If you develop nausea and vomiting that is not controlled by your nausea medication, call the clinic.   BELOW ARE SYMPTOMS THAT SHOULD BE REPORTED IMMEDIATELY:  *FEVER GREATER THAN 100.5 F  *CHILLS WITH OR WITHOUT FEVER  NAUSEA AND VOMITING THAT IS NOT CONTROLLED WITH YOUR NAUSEA MEDICATION  *UNUSUAL SHORTNESS OF BREATH  *UNUSUAL BRUISING OR BLEEDING  TENDERNESS IN MOUTH AND THROAT WITH OR WITHOUT PRESENCE OF ULCERS  *URINARY PROBLEMS  *BOWEL PROBLEMS  UNUSUAL RASH Items with * indicate a potential emergency and should be followed up as soon as possible.  Feel free to call the clinic you have any questions or concerns. The clinic phone number is [phone].  Please show the [place] at check-in to the Emergency Department and triage nurse.

## 2016-11-06 ENCOUNTER — Ambulatory Visit (HOSPITAL_BASED_OUTPATIENT_CLINIC_OR_DEPARTMENT_OTHER): Payer: BC Managed Care – PPO

## 2016-11-06 VITALS — BP 131/95 | HR 78 | Temp 98.2°F | Resp 18

## 2016-11-06 DIAGNOSIS — Z5189 Encounter for other specified aftercare: Secondary | ICD-10-CM

## 2016-11-06 DIAGNOSIS — C20 Malignant neoplasm of rectum: Secondary | ICD-10-CM

## 2016-11-06 DIAGNOSIS — C787 Secondary malignant neoplasm of liver and intrahepatic bile duct: Principal | ICD-10-CM

## 2016-11-06 MED ORDER — SODIUM CHLORIDE 0.9 % IJ SOLN
10.0000 mL | INTRAMUSCULAR | Status: DC | PRN
  Administered 2016-11-06: 10 mL
  Filled 2016-11-06: qty 10

## 2016-11-06 MED ORDER — PEGFILGRASTIM INJECTION 6 MG/0.6ML ~~LOC~~
6.0000 mg | PREFILLED_SYRINGE | Freq: Once | SUBCUTANEOUS | Status: AC
  Administered 2016-11-06: 6 mg via SUBCUTANEOUS

## 2016-11-06 MED ORDER — HEPARIN SOD (PORK) LOCK FLUSH 100 UNIT/ML IV SOLN
500.0000 [IU] | Freq: Once | INTRAVENOUS | Status: AC | PRN
  Administered 2016-11-06: 500 [IU]
  Filled 2016-11-06: qty 5

## 2016-11-14 ENCOUNTER — Other Ambulatory Visit: Payer: Self-pay | Admitting: Oncology

## 2016-11-18 ENCOUNTER — Ambulatory Visit (HOSPITAL_BASED_OUTPATIENT_CLINIC_OR_DEPARTMENT_OTHER): Payer: BC Managed Care – PPO

## 2016-11-18 ENCOUNTER — Other Ambulatory Visit (HOSPITAL_BASED_OUTPATIENT_CLINIC_OR_DEPARTMENT_OTHER): Payer: BC Managed Care – PPO

## 2016-11-18 ENCOUNTER — Ambulatory Visit (HOSPITAL_BASED_OUTPATIENT_CLINIC_OR_DEPARTMENT_OTHER): Payer: BC Managed Care – PPO | Admitting: Nurse Practitioner

## 2016-11-18 ENCOUNTER — Ambulatory Visit: Payer: BC Managed Care – PPO

## 2016-11-18 ENCOUNTER — Telehealth: Payer: Self-pay | Admitting: Nurse Practitioner

## 2016-11-18 VITALS — BP 118/87 | HR 97 | Temp 98.4°F | Resp 18 | Wt 141.1 lb

## 2016-11-18 DIAGNOSIS — R918 Other nonspecific abnormal finding of lung field: Secondary | ICD-10-CM | POA: Diagnosis not present

## 2016-11-18 DIAGNOSIS — C20 Malignant neoplasm of rectum: Secondary | ICD-10-CM

## 2016-11-18 DIAGNOSIS — C787 Secondary malignant neoplasm of liver and intrahepatic bile duct: Secondary | ICD-10-CM

## 2016-11-18 DIAGNOSIS — R490 Dysphonia: Secondary | ICD-10-CM | POA: Diagnosis not present

## 2016-11-18 DIAGNOSIS — Z5112 Encounter for antineoplastic immunotherapy: Secondary | ICD-10-CM

## 2016-11-18 DIAGNOSIS — Z5111 Encounter for antineoplastic chemotherapy: Secondary | ICD-10-CM

## 2016-11-18 DIAGNOSIS — R21 Rash and other nonspecific skin eruption: Secondary | ICD-10-CM

## 2016-11-18 DIAGNOSIS — Z95828 Presence of other vascular implants and grafts: Secondary | ICD-10-CM

## 2016-11-18 DIAGNOSIS — Z933 Colostomy status: Secondary | ICD-10-CM

## 2016-11-18 LAB — COMPREHENSIVE METABOLIC PANEL
ALT: 27 U/L (ref 0–55)
ANION GAP: 8 meq/L (ref 3–11)
AST: 21 U/L (ref 5–34)
Albumin: 3.5 g/dL (ref 3.5–5.0)
Alkaline Phosphatase: 124 U/L (ref 40–150)
BILIRUBIN TOTAL: 0.24 mg/dL (ref 0.20–1.20)
BUN: 13.9 mg/dL (ref 7.0–26.0)
CHLORIDE: 105 meq/L (ref 98–109)
CO2: 26 meq/L (ref 22–29)
CREATININE: 1 mg/dL (ref 0.7–1.3)
Calcium: 9.4 mg/dL (ref 8.4–10.4)
EGFR: 90 mL/min/{1.73_m2} — AB (ref >90)
Glucose: 145 mg/dl — ABNORMAL HIGH (ref 70–140)
Potassium: 3.7 mEq/L (ref 3.5–5.1)
Sodium: 139 mEq/L (ref 136–145)
TOTAL PROTEIN: 7.4 g/dL (ref 6.4–8.3)

## 2016-11-18 LAB — CBC WITH DIFFERENTIAL/PLATELET
BASO%: 0.4 % (ref 0.0–2.0)
BASOS ABS: 0 10*3/uL (ref 0.0–0.1)
EOS ABS: 0.1 10*3/uL (ref 0.0–0.5)
EOS%: 2.5 % (ref 0.0–7.0)
HCT: 34.8 % — ABNORMAL LOW (ref 38.4–49.9)
HGB: 11.3 g/dL — ABNORMAL LOW (ref 13.0–17.1)
LYMPH%: 15.8 % (ref 14.0–49.0)
MCH: 26 pg — AB (ref 27.2–33.4)
MCHC: 32.4 g/dL (ref 32.0–36.0)
MCV: 80.2 fL (ref 79.3–98.0)
MONO#: 0.3 10*3/uL (ref 0.1–0.9)
MONO%: 5.7 % (ref 0.0–14.0)
NEUT#: 4.4 10*3/uL (ref 1.5–6.5)
NEUT%: 75.6 % — AB (ref 39.0–75.0)
PLATELETS: 229 10*3/uL (ref 140–400)
RBC: 4.35 10*6/uL (ref 4.20–5.82)
RDW: 17.5 % — ABNORMAL HIGH (ref 11.0–14.6)
WBC: 5.9 10*3/uL (ref 4.0–10.3)
lymph#: 0.9 10*3/uL (ref 0.9–3.3)

## 2016-11-18 LAB — MAGNESIUM: Magnesium: 2.1 mg/dl (ref 1.5–2.5)

## 2016-11-18 MED ORDER — SODIUM CHLORIDE 0.9 % IV SOLN
2400.0000 mg/m2 | INTRAVENOUS | Status: DC
  Administered 2016-11-18: 4600 mg via INTRAVENOUS
  Filled 2016-11-18: qty 92

## 2016-11-18 MED ORDER — ATROPINE SULFATE 1 MG/ML IJ SOLN: 0.5000 mg | Freq: Once | INTRAMUSCULAR | Status: DC | PRN

## 2016-11-18 MED ORDER — IRINOTECAN HCL CHEMO INJECTION 100 MG/5ML
130.0000 mg/m2 | Freq: Once | INTRAVENOUS | Status: AC
  Administered 2016-11-18: 240 mg via INTRAVENOUS
  Filled 2016-11-18: qty 10

## 2016-11-18 MED ORDER — SODIUM CHLORIDE 0.9 % IJ SOLN
10.0000 mL | INTRAMUSCULAR | Status: DC | PRN
  Administered 2016-11-18: 10 mL via INTRAVENOUS
  Filled 2016-11-18: qty 10

## 2016-11-18 MED ORDER — DEXTROSE 5 % IV SOLN
400.0000 mg/m2 | Freq: Once | INTRAVENOUS | Status: AC
  Administered 2016-11-18: 764 mg via INTRAVENOUS
  Filled 2016-11-18: qty 38.2

## 2016-11-18 MED ORDER — SODIUM CHLORIDE 0.9 % IV SOLN
200.0000 mg | Freq: Once | INTRAVENOUS | Status: AC
  Administered 2016-11-18: 200 mg via INTRAVENOUS
  Filled 2016-11-18: qty 10

## 2016-11-18 MED ORDER — SODIUM CHLORIDE 0.9 % IV SOLN
Freq: Once | INTRAVENOUS | Status: AC
  Administered 2016-11-18: 14:00:00 via INTRAVENOUS
  Filled 2016-11-18: qty 5

## 2016-11-18 MED ORDER — HEPARIN SOD (PORK) LOCK FLUSH 100 UNIT/ML IV SOLN
500.0000 [IU] | Freq: Once | INTRAVENOUS | Status: DC | PRN
  Filled 2016-11-18: qty 5

## 2016-11-18 MED ORDER — SODIUM CHLORIDE 0.9 % IJ SOLN
10.0000 mL | INTRAMUSCULAR | Status: DC | PRN
  Filled 2016-11-18: qty 10

## 2016-11-18 MED ORDER — SODIUM CHLORIDE 0.9 % IV SOLN
Freq: Once | INTRAVENOUS | Status: AC
  Administered 2016-11-18: 14:00:00 via INTRAVENOUS

## 2016-11-18 MED ORDER — PALONOSETRON HCL INJECTION 0.25 MG/5ML
INTRAVENOUS | Status: AC
  Filled 2016-11-18: qty 5

## 2016-11-18 MED ORDER — PALONOSETRON HCL INJECTION 0.25 MG/5ML
0.2500 mg | Freq: Once | INTRAVENOUS | Status: AC
  Administered 2016-11-18: 0.25 mg via INTRAVENOUS

## 2016-11-18 NOTE — Patient Instructions (Signed)
[facility] Discharge Instructions for Patients Receiving Chemotherapy  Today you received the following chemotherapy agents panitumumab (Vectibix), irinotecan (Camptosar), Leucovorin, fluorouracil (Adrucil or 5FU).  To help prevent nausea and vomiting after your treatment, we encourage you to take your nausea medication   If you develop nausea and vomiting that is not controlled by your nausea medication, call the clinic.   BELOW ARE SYMPTOMS THAT SHOULD BE REPORTED IMMEDIATELY:  *FEVER GREATER THAN 100.5 F  *CHILLS WITH OR WITHOUT FEVER  NAUSEA AND VOMITING THAT IS NOT CONTROLLED WITH YOUR NAUSEA MEDICATION  *UNUSUAL SHORTNESS OF BREATH  *UNUSUAL BRUISING OR BLEEDING  TENDERNESS IN MOUTH AND THROAT WITH OR WITHOUT PRESENCE OF ULCERS  *URINARY PROBLEMS  *BOWEL PROBLEMS  UNUSUAL RASH Items with * indicate a potential emergency and should be followed up as soon as possible.  Feel free to call the clinic you have any questions or concerns. The clinic phone number is [phone].  Please show the [place] at check-in to the Emergency Department and triage nurse.

## 2016-11-18 NOTE — Telephone Encounter (Signed)
Scheduled appt per 8/30 los - Gave patient AVS and calender per los.

## 2016-11-18 NOTE — Progress Notes (Signed)
[facility] OFFICE PROGRESS NOTE   Diagnosis:  Rectal cancer  INTERVAL HISTORY:   Jacob [name] returns as scheduled. He notes a mild rash on his face and chest. The rash seems to be improving. Within a few days of the chemotherapy he developed hiccups, vomiting and decreased output from the colostomy. He thinks he had a "bowel blockage". He took Miralax and subsequently had diarrhea for 5 days. Colostomy now functioning normally. He has persistent hoarseness. Urinary symptoms have improved though he continues to note "discomfort and pressure" and frequent urination at nighttime. Leg swelling has resolved.  Objective:  Vital signs in last 24 hours:  Blood pressure 118/87, pulse 97, temperature 98.4 F (36.9 C), temperature source Oral, resp. rate 18, weight 141 lb 1.6 oz (64 kg), SpO2 100 %.    HEENT: No thrush or ulcers. Resp: Lungs clear bilaterally. Cardio: Regular rate and rhythm. GI: No hepatomegaly. Left lower quadrant colostomy. Vascular: No leg edema. Skin: Mild acne type rash scattered over the face and upper chest. Port-A-Cath without erythema.  Lab Results:  Lab Results  Component Value Date   WBC 5.9 11/18/2016   HGB 11.3 (L) 11/18/2016   HCT 34.8 (L) 11/18/2016   MCV 80.2 11/18/2016   PLT 229 11/18/2016   NEUTROABS 4.4 11/18/2016    Imaging:  No results found.  Medications: I have reviewed the patient's current medications.  Assessment/Plan: 1. Rectal cancer. Partially obstructing mass noted 1-2 cm from the anal verge on a colonoscopy 03/06/2012. Endoscopic ultrasound 03/14/2012 with a 7.5 mm thick, 3.2 cm wide hypoechoic, irregularly bordered mass that clearly passed into and through the muscularis propria layer of the distal rectal wall (uT3); 3 small (largest 7 mm) perirectal lymph nodes. The lymph nodes were all round, discrete, hypoechoic, homogenous; suspicious for malignant involvement (uN1).  No RAS mutation identified by [facility] 1  testing on the colon resection specimen 06/29/2012, APC alteration identified, microsatellite stable  He began radiation and concurrent Xeloda chemotherapy on 03/20/2012, completed 04/27/2012.   Low anterior resection/coloanal anastomosis and diverting ileostomy 06/29/2012 with the final pathology revealing a T2N0 tumor with extensive fibrosis and negative margins.   Cycle 1 of adjuvant CAPOX 07/19/2012. Cycle 5 of adjuvant CAPOX 10/11/2012.   CEA 2.5 on 11/14/2012.   CEA 14.6 03/08/2013.   Restaging CT evaluation 03/08/2013 with a 4 mm pulmonary nodule left lung base not identified on comparison exam; new liver lesions including a 29 x 26 mm irregular peripheral enhancing rounded lesion in the dome of the right hepatic lobe, a less well-defined new subcapsular lesion in the lateral right hepatic lobe measuring 12 mm, a new subcapsular lesion in the anterior right hepatic lobe adjacent to the gallbladder fossa measuring 10 mm; a rounded low-density lesion in the inferior right hepatic lobe measuring 10 mm compared to 7 mm on the prior study (radiologist commented this may represent an enlarged cyst).   MRI of the abdomen 04/12/2013 confirmed multiple T2 hyperintense metastatic lesions throughout the right liver   Initiation of FOLFIRI/Avastin with genotype based irinotecan dosing per the [facility] study 04/05/2013.   Restaging CT evaluation on 06/20/2013 (after 2 cycles/4 treatments) showed improvement in the liver metastases and stable size of a left lower lobe pulmonary nodule now with central cavitation.   Continuation of FOLFIRI/Avastin.   Restaging CT evaluation 08/14/2013-decrease in the size of liver metastases, slight decrease in the size of a cavitary left lower lobe nodule, no evidence of disease progression.   Status post right  hepatic lobectomy 09/17/2013. Pathology showed multiple foci of metastatic adenocarcinoma (5 nodules of metastatic adenocarcinoma 4 of which are  subcapsular with the nodules ranging in size from 0.6-1.8 cm in greatest dimension). Margins not involved. Biopsy of a portal lymph node showed benign adipose tissue; no lymph node tissue or malignancy.   Normal CEA 11/06/2013   CT 11/06/2013 with a new pleural-based right lower chest lesion, slight in enlargement of the a left-sided lung nodule  CT 01/29/2014 with a decrease in the right lower chest pleural-based lesion and a slight increase of a left-sided lung lesion, other lung lesions were stable  CT chest 05/02/2014 with a decrease in the size of a right lower lobe pulmonary nodule months similar size of a dominant left lower lobe nodule, minimal enlargement of a smaller left lower lobe nodule, no new site of disease  CT chest 11/07/2014 with a slight increase in several left-sided lung nodules  CT chest 02/10/2015 with new small left hilar lymph nodes, a possible new left lower lobe nodule, and stability of other lung nodules  PET scan 03/12/2015 with hypermetabolic left hilar nodes, hypermetabolic left lower lobe nodules, hyper metabolic retroperitoneal nodes, intense hypermetabolism at the coloanal anastomosis, and hypermetabolic thickening in the presacral space  Cycle 1 FOLFIRI/PANITUMUMAB 05/21/2015  Cycle 2 FOLFIRI/PANITUMUMAB 06/05/2015  Cycle 3 FOLFIRI/PANITUMUMAB 06/19/2015  Cycle 4 FOLFIRI 07/10/2015  Cycle 5 FOLFIRI 07/24/2015  Restaging CTs 08/06/2015-resolution of hilar/retroperitoneal adenopathy, improvement in the hypermetabolic lung nodule, other lung nodules are stable, no new lesions  Cycle 6 FOLFIRI with PANITUMUMAB 08/28/2015 -panitumumab dose reduced  Cycle 7 FOLFIRI 09/11/2015-no panitumumab given  Cycle 8 FOLFIRI with PANITUMUMAB 09/25/2015-PANITUMUMAB dose reduced  Cycle 9 FOLFIRI 10/09/2015-no PANITUMUMAB given  Cycle 10 FOLFIRI with panitumumab 10/23/2015  CTs 11/06/2015-possible slight enlargement of a left lower lobe nodule, no other evidence  of disease progression.  CTs 02/16/2016-enlargement of left-sided pulmonary nodules and mediastinal/left hilar nodes, improved splenomegaly  CTs 06/21/2016-increased size of mediastinal/left hilar nodes, increased left lung nodules, and increased soft tissue at the porta hepatis  CTs 10/28/2016-progressive disease in the chest and abdomen with an enlarging left hilar mass and slight interval enlargement of pulmonary lesions. Right upper quadrant necrotic nodal mass significantly increased in size with mass effect on the liver and possible invasion. Significant compression of the intrahepatic IVC. New right hepatic lobe lesion. Moderate pelvic ascites.  Cycle 1 FOLFIRI/PANITUMUMAB 11/04/2016  Cycle 2 FOLFIRI/PANITUMUMAB 11/18/2016 2. Irregular bowel habits/rectal bleeding secondary to #1. 3. History of Mild elevation of the liver enzymes. Question secondary to hepatic steatosis. 4. Indeterminate 8 mm posterior right liver lesion on the staging CT 03/06/2012. 5. Mildly elevated CEA at 5.7 on 03/06/2012. 6. History of radiation erythema at the groin and perineum 7. 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 No clinical evidence of thrombophlebitis or extravasation. 8. Delayed nausea following chemotherapy-Decadron prophylaxis was added with cycle 3 CAPOX-improved. 9. Ileostomy takedown 12/07/2012. 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 11. Port-A-Cath placement 12/31/204 12. Severe neutropenia secondary to chemotherapy following cycle 1 of FOLFIRI, chemotherapy was dose reduced and he received Neulasta with day 15 cycle 1  13. Nausea and vomiting following cycle 1 of FOLFIRI 14. Rectal stricture-manual/colonoscopic dilatation by Dr. Ardis Hughs 03/01/2016  APR 08/17/2016-no evidence of malignancy 15. Leukopenia/Thrombocytopenia-persistent, potentially a sequelae of chemotherapy or hepatic toxicity from  chemotherapy/radiation. Bone marrow biopsy 11/20/2014 showed cellular bone marrow with trilineage hematopoiesis. Significant dyspoiesis was not present and there was no evidence of  metastatic carcinoma. Cytogenetic analysis showed the presence of normal male chromosomes with no observable clonal chromosomal abnormalities.  probable cirrhosis 16. Genetic testing-negative genetic panel in March 2014 17. 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 Severe over the face-steroid Dosepak prescribed 07/03/2015. Improved 07/10/2015. Further improved 07/24/2015, 08/07/2015, 08/28/2015 18. Diarrhea secondary to chemotherapy-encouraged to use Imodium 07/03/2015 19. History of Paronychia secondary to PANITUMUMAB    Disposition: Mr. Kock appears stable. He has completed 1 cycle of FOLFIRI/PANITUMUMAB. Plan to proceed with cycle 2 today as scheduled.  His weight is down significantly as compared to the last office visit. This is likely a combination of resolution of leg edema and poor oral intake. We will continue to monitor closely and adjust chemotherapy doses as needed. He declines a nutrition referral.  He will return for a follow-up visit and cycle 3 FOLFIRI/PANITUMUMAB in 2 weeks. He will contact the office in the interim with any problems.  Plan reviewed with Dr. Benay Spice.  Ned Card ANP/GNP-BC   11/18/2016  12:28 PM

## 2016-11-20 ENCOUNTER — Ambulatory Visit (HOSPITAL_BASED_OUTPATIENT_CLINIC_OR_DEPARTMENT_OTHER): Payer: BC Managed Care – PPO

## 2016-11-20 VITALS — BP 123/82 | HR 69 | Temp 97.9°F | Resp 16

## 2016-11-20 DIAGNOSIS — Z5189 Encounter for other specified aftercare: Secondary | ICD-10-CM

## 2016-11-20 DIAGNOSIS — C20 Malignant neoplasm of rectum: Secondary | ICD-10-CM | POA: Diagnosis not present

## 2016-11-20 DIAGNOSIS — C787 Secondary malignant neoplasm of liver and intrahepatic bile duct: Principal | ICD-10-CM

## 2016-11-20 MED ORDER — HEPARIN SOD (PORK) LOCK FLUSH 100 UNIT/ML IV SOLN
500.0000 [IU] | Freq: Once | INTRAVENOUS | Status: AC | PRN
  Administered 2016-11-20: 500 [IU]
  Filled 2016-11-20: qty 5

## 2016-11-20 MED ORDER — SODIUM CHLORIDE 0.9 % IJ SOLN
10.0000 mL | INTRAMUSCULAR | Status: DC | PRN
  Administered 2016-11-20: 10 mL
  Filled 2016-11-20: qty 10

## 2016-11-20 MED ORDER — PEGFILGRASTIM INJECTION 6 MG/0.6ML ~~LOC~~
6.0000 mg | PREFILLED_SYRINGE | Freq: Once | SUBCUTANEOUS | Status: AC
  Administered 2016-11-20: 6 mg via SUBCUTANEOUS

## 2016-11-28 ENCOUNTER — Other Ambulatory Visit: Payer: Self-pay | Admitting: Oncology

## 2016-12-02 ENCOUNTER — Encounter: Payer: BC Managed Care – PPO | Admitting: Nutrition

## 2016-12-02 ENCOUNTER — Other Ambulatory Visit: Payer: Self-pay | Admitting: *Deleted

## 2016-12-02 ENCOUNTER — Ambulatory Visit: Payer: BC Managed Care – PPO

## 2016-12-02 ENCOUNTER — Other Ambulatory Visit (HOSPITAL_BASED_OUTPATIENT_CLINIC_OR_DEPARTMENT_OTHER): Payer: BC Managed Care – PPO

## 2016-12-02 ENCOUNTER — Ambulatory Visit (HOSPITAL_BASED_OUTPATIENT_CLINIC_OR_DEPARTMENT_OTHER): Payer: BC Managed Care – PPO | Admitting: Oncology

## 2016-12-02 ENCOUNTER — Ambulatory Visit (HOSPITAL_BASED_OUTPATIENT_CLINIC_OR_DEPARTMENT_OTHER): Payer: BC Managed Care – PPO

## 2016-12-02 ENCOUNTER — Ambulatory Visit: Payer: BC Managed Care – PPO | Admitting: Nutrition

## 2016-12-02 VITALS — BP 117/76 | HR 76 | Temp 98.2°F | Resp 18 | Ht 70.0 in | Wt 142.0 lb

## 2016-12-02 DIAGNOSIS — R109 Unspecified abdominal pain: Secondary | ICD-10-CM | POA: Diagnosis not present

## 2016-12-02 DIAGNOSIS — C20 Malignant neoplasm of rectum: Secondary | ICD-10-CM

## 2016-12-02 DIAGNOSIS — Z95828 Presence of other vascular implants and grafts: Secondary | ICD-10-CM

## 2016-12-02 DIAGNOSIS — Z5111 Encounter for antineoplastic chemotherapy: Secondary | ICD-10-CM | POA: Diagnosis not present

## 2016-12-02 DIAGNOSIS — Z5112 Encounter for antineoplastic immunotherapy: Secondary | ICD-10-CM

## 2016-12-02 DIAGNOSIS — C787 Secondary malignant neoplasm of liver and intrahepatic bile duct: Secondary | ICD-10-CM

## 2016-12-02 DIAGNOSIS — K5900 Constipation, unspecified: Secondary | ICD-10-CM

## 2016-12-02 DIAGNOSIS — R490 Dysphonia: Secondary | ICD-10-CM | POA: Diagnosis not present

## 2016-12-02 DIAGNOSIS — G620 Drug-induced polyneuropathy: Secondary | ICD-10-CM

## 2016-12-02 LAB — CBC WITH DIFFERENTIAL/PLATELET
BASO%: 0.5 % (ref 0.0–2.0)
BASOS ABS: 0 10*3/uL (ref 0.0–0.1)
EOS%: 1.4 % (ref 0.0–7.0)
Eosinophils Absolute: 0.1 10*3/uL (ref 0.0–0.5)
HCT: 32.4 % — ABNORMAL LOW (ref 38.4–49.9)
HGB: 10.7 g/dL — ABNORMAL LOW (ref 13.0–17.1)
LYMPH%: 12.2 % — AB (ref 14.0–49.0)
MCH: 26 pg — AB (ref 27.2–33.4)
MCHC: 33.1 g/dL (ref 32.0–36.0)
MCV: 78.5 fL — ABNORMAL LOW (ref 79.3–98.0)
MONO#: 0.8 10*3/uL (ref 0.1–0.9)
MONO%: 9.3 % (ref 0.0–14.0)
NEUT#: 6.9 10*3/uL — ABNORMAL HIGH (ref 1.5–6.5)
NEUT%: 76.6 % — AB (ref 39.0–75.0)
Platelets: 184 10*3/uL (ref 140–400)
RBC: 4.13 10*6/uL — ABNORMAL LOW (ref 4.20–5.82)
RDW: 17.9 % — ABNORMAL HIGH (ref 11.0–14.6)
WBC: 9 10*3/uL (ref 4.0–10.3)
lymph#: 1.1 10*3/uL (ref 0.9–3.3)

## 2016-12-02 LAB — COMPREHENSIVE METABOLIC PANEL
ALT: 19 U/L (ref 0–55)
AST: 16 U/L (ref 5–34)
Albumin: 3.3 g/dL — ABNORMAL LOW (ref 3.5–5.0)
Alkaline Phosphatase: 95 U/L (ref 40–150)
Anion Gap: 8 mEq/L (ref 3–11)
BUN: 11.8 mg/dL (ref 7.0–26.0)
CHLORIDE: 108 meq/L (ref 98–109)
CO2: 26 mEq/L (ref 22–29)
Calcium: 8.9 mg/dL (ref 8.4–10.4)
Creatinine: 0.8 mg/dL (ref 0.7–1.3)
EGFR: >90 mL/min/{1.73_m2} (ref >90)
GLUCOSE: 79 mg/dL (ref 70–140)
POTASSIUM: 3.1 meq/L — AB (ref 3.5–5.1)
SODIUM: 142 meq/L (ref 136–145)
Total Bilirubin: <0.22 mg/dL (ref 0.20–1.20)
Total Protein: 6.6 g/dL (ref 6.4–8.3)

## 2016-12-02 LAB — MAGNESIUM: Magnesium: 2.2 mg/dl (ref 1.5–2.5)

## 2016-12-02 MED ORDER — PALONOSETRON HCL INJECTION 0.25 MG/5ML
0.2500 mg | Freq: Once | INTRAVENOUS | Status: AC
  Administered 2016-12-02: 0.25 mg via INTRAVENOUS

## 2016-12-02 MED ORDER — SODIUM CHLORIDE 0.9 % IJ SOLN
10.0000 mL | INTRAMUSCULAR | Status: DC | PRN
  Administered 2016-12-02: 10 mL via INTRAVENOUS
  Filled 2016-12-02: qty 10

## 2016-12-02 MED ORDER — ATROPINE SULFATE 1 MG/ML IJ SOLN: 0.5000 mg | Freq: Once | INTRAMUSCULAR | Status: DC | PRN

## 2016-12-02 MED ORDER — LEUCOVORIN CALCIUM INJECTION 350 MG
400.0000 mg/m2 | Freq: Once | INTRAVENOUS | Status: AC
  Administered 2016-12-02: 764 mg via INTRAVENOUS
  Filled 2016-12-02: qty 38.2

## 2016-12-02 MED ORDER — DEXTROSE 5 % IV SOLN
130.0000 mg/m2 | Freq: Once | INTRAVENOUS | Status: AC
  Administered 2016-12-02: 240 mg via INTRAVENOUS
  Filled 2016-12-02: qty 10

## 2016-12-02 MED ORDER — SODIUM CHLORIDE 0.9 % IV SOLN
2.8000 mg/kg | Freq: Once | INTRAVENOUS | Status: AC
  Administered 2016-12-02: 200 mg via INTRAVENOUS
  Filled 2016-12-02: qty 10

## 2016-12-02 MED ORDER — SODIUM CHLORIDE 0.9 % IV SOLN
2400.0000 mg/m2 | INTRAVENOUS | Status: DC
  Administered 2016-12-02: 4600 mg via INTRAVENOUS
  Filled 2016-12-02: qty 92

## 2016-12-02 MED ORDER — POTASSIUM CHLORIDE CRYS ER 20 MEQ PO TBCR: 20.0000 meq | EXTENDED_RELEASE_TABLET | Freq: Every day | ORAL | 0 refills | Status: DC

## 2016-12-02 MED ORDER — PROCHLORPERAZINE MALEATE 10 MG PO TABS
ORAL_TABLET | ORAL | Status: AC
  Filled 2016-12-02: qty 1

## 2016-12-02 MED ORDER — SODIUM CHLORIDE 0.9 % IV SOLN
Freq: Once | INTRAVENOUS | Status: AC
  Administered 2016-12-02: 12:00:00 via INTRAVENOUS

## 2016-12-02 MED ORDER — PROCHLORPERAZINE MALEATE 10 MG PO TABS
10.0000 mg | ORAL_TABLET | Freq: Once | ORAL | Status: AC
  Administered 2016-12-02: 10 mg via ORAL

## 2016-12-02 MED ORDER — POTASSIUM CHLORIDE CRYS ER 20 MEQ PO TBCR
20.0000 meq | EXTENDED_RELEASE_TABLET | Freq: Once | ORAL | Status: AC
  Administered 2016-12-02: 20 meq via ORAL
  Filled 2016-12-02: qty 1

## 2016-12-02 MED ORDER — PALONOSETRON HCL INJECTION 0.25 MG/5ML
INTRAVENOUS | Status: AC
  Filled 2016-12-02: qty 5

## 2016-12-02 MED ORDER — SODIUM CHLORIDE 0.9 % IV SOLN
Freq: Once | INTRAVENOUS | Status: AC
  Administered 2016-12-02: 12:00:00 via INTRAVENOUS
  Filled 2016-12-02: qty 5

## 2016-12-02 NOTE — Progress Notes (Signed)
M.D. is requesting low residue diet education for this 46 year old male patient diagnosed with metastatic rectal cancer. Patient endorses weight loss and nausea and vomiting after treatment. He enjoys milk shakes occasionally. He would like to gain about 20 pounds.  Provided low residue diet education.   Gave patient fact sheet questions were answered.  Teach back method used.  Contact information was provided. Patient will contact me with questions.  **Disclaimer: This note was dictated with voice recognition software. Similar sounding words can inadvertently be transcribed and this note may contain transcription errors which may not have been corrected upon publication of note.**

## 2016-12-02 NOTE — Progress Notes (Signed)
[place] OFFICE PROGRESS NOTE   Diagnosis: Rectal cancer  INTERVAL HISTORY:   [name] returns as scheduled. He completed another cycle of FOLFIRI/panitumumab on 11/18/2016. He reports a mild skin rash. He had constipation for 3 days following chemotherapy. He took MiraLAX and then developed diarrhea. Good appetite. He has mouth soreness, but no ulcers.  He reports improvement in dyspnea. He continues to have hoarseness.  He reports several episodes of abdominal pain and nausea/vomiting/constipation since undergoing the APR procedure.  Objective:  Vital signs in last 24 hours:  Blood pressure 117/76, pulse 76, temperature 98.2 F (36.8 C), temperature source Oral, resp. rate 18, height '5\' 10"'  (1.778 m), weight 142 lb (64.4 kg), SpO2 100 %.    HEENT: No thrush or ulcers Resp: Lungs clear bilaterally Cardio: Regular rate and rhythm GI: No hepatosplenomegaly, soft, nontender, left lower quadrant colostomy Vascular: No leg edema  Skin: Mild acne type rash over the trunk   Portacath/PICC-without erythema  Lab Results:  Lab Results  Component Value Date   WBC 9.0 12/02/2016   HGB 10.7 (L) 12/02/2016   HCT 32.4 (L) 12/02/2016   MCV 78.5 (L) 12/02/2016   PLT 184 12/02/2016   NEUTROABS 6.9 (H) 12/02/2016    CMP     Component Value Date/Time   NA 142 12/02/2016 0930   K 3.1 (L) 12/02/2016 0930   CL 103 08/20/2016 0451   CL 108 (H) 08/30/2012 0903   CO2 26 12/02/2016 0930   GLUCOSE 79 12/02/2016 0930   GLUCOSE 96 08/30/2012 0903   BUN 11.8 12/02/2016 0930   CREATININE 0.8 12/02/2016 0930   CALCIUM 8.9 12/02/2016 0930   PROT 6.6 12/02/2016 0930   ALBUMIN 3.3 (L) 12/02/2016 0930   AST 16 12/02/2016 0930   ALT 19 12/02/2016 0930   ALKPHOS 95 12/02/2016 0930   BILITOT <0.22 12/02/2016 0930   GFRNONAA >60 08/20/2016 0451   GFRAA >60 08/20/2016 0451    Lab Results  Component Value Date   CEA1 150.98 (H) 10/25/2016    Lab Results  Component  Value Date   INR 1.05 11/20/2014    Imaging:  No results found.  Medications: I have reviewed the patient's current medications.  Assessment/Plan: 1. Rectal cancer. Partially obstructing mass noted 1-2 cm from the anal verge on a colonoscopy 03/06/2012. Endoscopic ultrasound 03/14/2012 with a 7.5 mm thick, 3.2 cm wide hypoechoic, irregularly bordered mass that clearly passed into and through the muscularis propria layer of the distal rectal wall (uT3); 3 small (largest 7 mm) perirectal lymph nodes. The lymph nodes were all round, discrete, hypoechoic, homogenous; suspicious for malignant involvement (uN1).  No RAS mutation identified by [facility] 1 testing on the colon resection specimen 06/29/2012, APC alteration identified, microsatellite stable  He began radiation and concurrent Xeloda chemotherapy on 03/20/2012, completed 04/27/2012.   Low anterior resection/coloanal anastomosis and diverting ileostomy 06/29/2012 with the final pathology revealing a T2N0 tumor with extensive fibrosis and negative margins.   Cycle 1 of adjuvant CAPOX 07/19/2012. Cycle 5 of adjuvant CAPOX 10/11/2012.   CEA 2.5 on 11/14/2012.   CEA 14.6 03/08/2013.   Restaging CT evaluation 03/08/2013 with a 4 mm pulmonary nodule left lung base not identified on comparison exam; new liver lesions including a 29 x 26 mm irregular peripheral enhancing rounded lesion in the dome of the right hepatic lobe, a less well-defined new subcapsular lesion in the lateral right hepatic lobe measuring 12 mm, a new subcapsular lesion in the anterior right hepatic  lobe adjacent to the gallbladder fossa measuring 10 mm; a rounded low-density lesion in the inferior right hepatic lobe measuring 10 mm compared to 7 mm on the prior study (radiologist commented this may represent an enlarged cyst).   MRI of the abdomen 04/12/2013 confirmed multiple T2 hyperintense metastatic lesions throughout the right liver   Initiation of  FOLFIRI/Avastin with genotype based irinotecan dosing per the [facility] study 04/05/2013.   Restaging CT evaluation on 06/20/2013 (after 2 cycles/4 treatments) showed improvement in the liver metastases and stable size of a left lower lobe pulmonary nodule now with central cavitation.   Continuation of FOLFIRI/Avastin.   Restaging CT evaluation 08/14/2013-decrease in the size of liver metastases, slight decrease in the size of a cavitary left lower lobe nodule, no evidence of disease progression.   Status post right hepatic lobectomy 09/17/2013. Pathology showed multiple foci of metastatic adenocarcinoma (5 nodules of metastatic adenocarcinoma 4 of which are subcapsular with the nodules ranging in size from 0.6-1.8 cm in greatest dimension). Margins not involved. Biopsy of a portal lymph node showed benign adipose tissue; no lymph node tissue or malignancy.   Normal CEA 11/06/2013   CT 11/06/2013 with a new pleural-based right lower chest lesion, slight in enlargement of the a left-sided lung nodule  CT 01/29/2014 with a decrease in the right lower chest pleural-based lesion and a slight increase of a left-sided lung lesion, other lung lesions were stable  CT chest 05/02/2014 with a decrease in the size of a right lower lobe pulmonary nodule months similar size of a dominant left lower lobe nodule, minimal enlargement of a smaller left lower lobe nodule, no new site of disease  CT chest 11/07/2014 with a slight increase in several left-sided lung nodules  CT chest 02/10/2015 with new small left hilar lymph nodes, a possible new left lower lobe nodule, and stability of other lung nodules  PET scan 03/12/2015 with hypermetabolic left hilar nodes, hypermetabolic left lower lobe nodules, hyper metabolic retroperitoneal nodes, intense hypermetabolism at the coloanal anastomosis, and hypermetabolic thickening in the presacral space  Cycle 1 FOLFIRI/PANITUMUMAB 05/21/2015  Cycle 2  FOLFIRI/PANITUMUMAB 06/05/2015  Cycle 3 FOLFIRI/PANITUMUMAB 06/19/2015  Cycle 4 FOLFIRI 07/10/2015  Cycle 5 FOLFIRI 07/24/2015  Restaging CTs 08/06/2015-resolution of hilar/retroperitoneal adenopathy, improvement in the hypermetabolic lung nodule, other lung nodules are stable, no new lesions  Cycle 6 FOLFIRI with PANITUMUMAB 08/28/2015 -panitumumab dose reduced  Cycle 7 FOLFIRI 09/11/2015-no panitumumab given  Cycle 8 FOLFIRI with PANITUMUMAB 09/25/2015-PANITUMUMAB dose reduced  Cycle 9 FOLFIRI 10/09/2015-no PANITUMUMAB given  Cycle 10 FOLFIRI with panitumumab 10/23/2015  CTs 11/06/2015-possible slight enlargement of a left lower lobe nodule, no other evidence of disease progression.  CTs 02/16/2016-enlargement of left-sided pulmonary nodules and mediastinal/left hilar nodes, improved splenomegaly  CTs 06/21/2016-increased size of mediastinal/left hilar nodes, increased left lung nodules, and increased soft tissue at the porta hepatis  CTs 10/28/2016-progressive disease in the chest and abdomen with an enlarging left hilar mass and slight interval enlargement of pulmonary lesions. Right upper quadrant necrotic nodal mass significantly increased in size with mass effect on the liver and possible invasion. Significant compression of the intrahepatic IVC. New right hepatic lobe lesion. Moderate pelvic ascites.  Cycle 1 FOLFIRI/PANITUMUMAB 11/04/2016  Cycle 2 FOLFIRI/PANITUMUMAB 11/18/2016  Cycle 3 FOLFIRI/panitumumab 12/02/2016 2. Irregular bowel habits/rectal bleeding secondary to #1. 3. History of Mild elevation of the liver enzymes. Question secondary to hepatic steatosis. 4. Indeterminate 8 mm posterior right liver lesion on the staging CT 03/06/2012. 5. Mildly elevated CEA  at 5.7 on 03/06/2012. 6. History of radiation erythema at the groin and perineum 7. Right hand/arm tenderness and numbness following cycle 1 oxaliplatin-likely related to a local toxicity from  oxaliplatin/neuropathy. No clinical evidence of thrombophlebitis or extravasation. 8. Delayed nausea following chemotherapy-Decadron prophylaxis was added with cycle 3 CAPOX-improved. 9. Ileostomy takedown 12/07/2012. 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 11. Port-A-Cath placement 12/31/204 12. Severe neutropenia secondary to chemotherapy following cycle 1 of FOLFIRI, chemotherapy was dose reduced and he received Neulasta with day 15 cycle 1  13. Nausea and vomiting following cycle 1 of FOLFIRI 14. Rectal stricture-manual/colonoscopic dilatation by Dr. Ardis Hughs 03/01/2016  APR 08/17/2016-no evidence of malignancy 15. Leukopenia/Thrombocytopenia-persistent, potentially a sequelae of chemotherapy or hepatic toxicity from chemotherapy/radiation. Bone marrow biopsy 11/20/2014 showed cellular bone marrow with trilineage hematopoiesis. Significant dyspoiesis was not present and there was no evidence of metastatic carcinoma. Cytogenetic analysis showed the presence of normal male chromosomes with no observable clonal chromosomal abnormalities.  probable cirrhosis 16. Genetic testing-negative genetic panel in March 2014 17. 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 Severe over the face-steroid Dosepak prescribed 07/03/2015. Improved 07/10/2015. Further improved 07/24/2015, 08/07/2015, 08/28/2015 18. Diarrhea secondary to chemotherapy-encouraged to use Imodium 07/03/2015 19. History of Paronychia secondary to PANITUMUMAB 20. 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 Laker appears unchanged. The intermittent episodes of constipation and abdominal pain may be related to adhesions. I recommended he follow a low residual diet. He will try stool softeners surrounding cycles of chemotherapy.  We discussed management of the hoarseness. He says he will consider an ENT referral if the hoarseness persisted when he returns in 2 weeks.  The plan is to proceed with  FOLFIRI/panitumumab. We will check the CEA when he returns in 2 weeks. He will be scheduled for a restaging CT after 5 cycles.  25 minutes were spent with the patient today. The majority of the time was used for counseling and coordination of care.  Donneta Romberg, MD  12/02/2016  11:37 AM

## 2016-12-02 NOTE — Progress Notes (Signed)
Patient complains of nausea. Patient states compazine relieves his nausea at home. Dr. Benay Spice notified. Order given and carried out for compazine 10 mg PO. Patient verbalized understanding.   Patient reports nausea better.

## 2016-12-02 NOTE — Patient Instructions (Signed)
[place] Discharge Instructions for Patients Receiving Chemotherapy  Today you received the following chemotherapy agents panitumumab (Vectibix), irinotecan (Camptosar), Leucovorin, fluorouracil (Adrucil or 5FU).  To help prevent nausea and vomiting after your treatment, we encourage you to take your nausea medication   If you develop nausea and vomiting that is not controlled by your nausea medication, call the clinic.   BELOW ARE SYMPTOMS THAT SHOULD BE REPORTED IMMEDIATELY:  *FEVER GREATER THAN 100.5 F  *CHILLS WITH OR WITHOUT FEVER  NAUSEA AND VOMITING THAT IS NOT CONTROLLED WITH YOUR NAUSEA MEDICATION  *UNUSUAL SHORTNESS OF BREATH  *UNUSUAL BRUISING OR BLEEDING  TENDERNESS IN MOUTH AND THROAT WITH OR WITHOUT PRESENCE OF ULCERS  *URINARY PROBLEMS  *BOWEL PROBLEMS  UNUSUAL RASH Items with * indicate a potential emergency and should be followed up as soon as possible.  Feel free to call the clinic you have any questions or concerns. The clinic phone number is [phone].  Please show the [place] at check-in to the Emergency Department and triage nurse.

## 2016-12-03 ENCOUNTER — Telehealth: Payer: Self-pay | Admitting: Oncology

## 2016-12-03 NOTE — Telephone Encounter (Signed)
Scheduled appt per 9/13 los - Patient to pick up new scheduled - next scheduled visit

## 2016-12-04 ENCOUNTER — Ambulatory Visit (HOSPITAL_BASED_OUTPATIENT_CLINIC_OR_DEPARTMENT_OTHER): Payer: BC Managed Care – PPO

## 2016-12-04 ENCOUNTER — Ambulatory Visit: Payer: BC Managed Care – PPO

## 2016-12-04 VITALS — BP 118/79 | HR 78 | Temp 97.9°F | Resp 18

## 2016-12-04 DIAGNOSIS — Z5189 Encounter for other specified aftercare: Secondary | ICD-10-CM

## 2016-12-04 DIAGNOSIS — C20 Malignant neoplasm of rectum: Secondary | ICD-10-CM

## 2016-12-04 DIAGNOSIS — C787 Secondary malignant neoplasm of liver and intrahepatic bile duct: Secondary | ICD-10-CM | POA: Diagnosis not present

## 2016-12-04 MED ORDER — HEPARIN SOD (PORK) LOCK FLUSH 100 UNIT/ML IV SOLN
500.0000 [IU] | Freq: Once | INTRAVENOUS | Status: AC | PRN
  Administered 2016-12-04: 500 [IU]
  Filled 2016-12-04: qty 5

## 2016-12-04 MED ORDER — SODIUM CHLORIDE 0.9 % IJ SOLN
10.0000 mL | INTRAMUSCULAR | Status: DC | PRN
  Administered 2016-12-04: 10 mL
  Filled 2016-12-04: qty 10

## 2016-12-04 MED ORDER — PEGFILGRASTIM INJECTION 6 MG/0.6ML ~~LOC~~
6.0000 mg | PREFILLED_SYRINGE | Freq: Once | SUBCUTANEOUS | Status: AC
  Administered 2016-12-04: 6 mg via SUBCUTANEOUS

## 2016-12-12 ENCOUNTER — Other Ambulatory Visit: Payer: Self-pay | Admitting: Oncology

## 2016-12-15 ENCOUNTER — Telehealth: Payer: Self-pay | Admitting: Oncology

## 2016-12-15 ENCOUNTER — Ambulatory Visit (HOSPITAL_BASED_OUTPATIENT_CLINIC_OR_DEPARTMENT_OTHER): Payer: BC Managed Care – PPO

## 2016-12-15 ENCOUNTER — Other Ambulatory Visit (HOSPITAL_BASED_OUTPATIENT_CLINIC_OR_DEPARTMENT_OTHER): Payer: BC Managed Care – PPO

## 2016-12-15 ENCOUNTER — Ambulatory Visit (HOSPITAL_BASED_OUTPATIENT_CLINIC_OR_DEPARTMENT_OTHER): Payer: BC Managed Care – PPO | Admitting: Nurse Practitioner

## 2016-12-15 ENCOUNTER — Ambulatory Visit: Payer: BC Managed Care – PPO

## 2016-12-15 VITALS — BP 110/70 | HR 80 | Temp 98.5°F | Ht 70.0 in | Wt 142.7 lb

## 2016-12-15 DIAGNOSIS — Z23 Encounter for immunization: Secondary | ICD-10-CM | POA: Diagnosis not present

## 2016-12-15 DIAGNOSIS — C20 Malignant neoplasm of rectum: Secondary | ICD-10-CM

## 2016-12-15 DIAGNOSIS — Z5111 Encounter for antineoplastic chemotherapy: Secondary | ICD-10-CM

## 2016-12-15 DIAGNOSIS — Z5112 Encounter for antineoplastic immunotherapy: Secondary | ICD-10-CM | POA: Diagnosis not present

## 2016-12-15 DIAGNOSIS — C787 Secondary malignant neoplasm of liver and intrahepatic bile duct: Secondary | ICD-10-CM

## 2016-12-15 DIAGNOSIS — R21 Rash and other nonspecific skin eruption: Secondary | ICD-10-CM

## 2016-12-15 DIAGNOSIS — R194 Change in bowel habit: Secondary | ICD-10-CM | POA: Diagnosis not present

## 2016-12-15 DIAGNOSIS — Z95828 Presence of other vascular implants and grafts: Secondary | ICD-10-CM

## 2016-12-15 LAB — COMPREHENSIVE METABOLIC PANEL
ALBUMIN: 3.1 g/dL — AB (ref 3.5–5.0)
ALK PHOS: 84 U/L (ref 40–150)
ALT: 12 U/L (ref 0–55)
AST: 15 U/L (ref 5–34)
Anion Gap: 6 mEq/L (ref 3–11)
BUN: 11.7 mg/dL (ref 7.0–26.0)
CALCIUM: 8.5 mg/dL (ref 8.4–10.4)
CO2: 25 mEq/L (ref 22–29)
CREATININE: 0.8 mg/dL (ref 0.7–1.3)
Chloride: 109 mEq/L (ref 98–109)
EGFR: >90 mL/min/{1.73_m2} (ref >90)
GLUCOSE: 80 mg/dL (ref 70–140)
POTASSIUM: 3.6 meq/L (ref 3.5–5.1)
SODIUM: 140 meq/L (ref 136–145)
TOTAL PROTEIN: 6.1 g/dL — AB (ref 6.4–8.3)

## 2016-12-15 LAB — CBC WITH DIFFERENTIAL/PLATELET
BASO%: 0.6 % (ref 0.0–2.0)
BASOS ABS: 0 10*3/uL (ref 0.0–0.1)
EOS ABS: 0.2 10*3/uL (ref 0.0–0.5)
EOS%: 2.3 % (ref 0.0–7.0)
HCT: 31 % — ABNORMAL LOW (ref 38.4–49.9)
HEMOGLOBIN: 10 g/dL — AB (ref 13.0–17.1)
LYMPH#: 0.8 10*3/uL — AB (ref 0.9–3.3)
LYMPH%: 11.9 % — ABNORMAL LOW (ref 14.0–49.0)
MCH: 25.6 pg — AB (ref 27.2–33.4)
MCHC: 32.1 g/dL (ref 32.0–36.0)
MCV: 79.7 fL (ref 79.3–98.0)
MONO#: 0.8 10*3/uL (ref 0.1–0.9)
MONO%: 12.4 % (ref 0.0–14.0)
NEUT%: 72.8 % (ref 39.0–75.0)
NEUTROS ABS: 4.9 10*3/uL (ref 1.5–6.5)
Platelets: 152 10*3/uL (ref 140–400)
RBC: 3.89 10*6/uL — ABNORMAL LOW (ref 4.20–5.82)
RDW: 19.1 % — AB (ref 11.0–14.6)
WBC: 6.7 10*3/uL (ref 4.0–10.3)

## 2016-12-15 LAB — CEA (IN HOUSE-CHCC): CEA (CHCC-In House): 22.62 ng/mL — ABNORMAL HIGH (ref 0.00–5.00)

## 2016-12-15 LAB — MAGNESIUM: MAGNESIUM: 2.2 mg/dL (ref 1.5–2.5)

## 2016-12-15 MED ORDER — SODIUM CHLORIDE 0.9 % IV SOLN
Freq: Once | INTRAVENOUS | Status: AC
  Administered 2016-12-15: 13:00:00 via INTRAVENOUS

## 2016-12-15 MED ORDER — SODIUM CHLORIDE 0.9 % IV SOLN
Freq: Once | INTRAVENOUS | Status: AC
  Administered 2016-12-15: 14:00:00 via INTRAVENOUS
  Filled 2016-12-15: qty 5

## 2016-12-15 MED ORDER — SODIUM CHLORIDE 0.9 % IV SOLN
2400.0000 mg/m2 | INTRAVENOUS | Status: DC
  Administered 2016-12-15: 4600 mg via INTRAVENOUS
  Filled 2016-12-15: qty 92

## 2016-12-15 MED ORDER — LEUCOVORIN CALCIUM INJECTION 350 MG
400.0000 mg/m2 | Freq: Once | INTRAVENOUS | Status: AC
  Administered 2016-12-15: 764 mg via INTRAVENOUS
  Filled 2016-12-15: qty 38.2

## 2016-12-15 MED ORDER — INFLUENZA VAC SPLIT QUAD 0.5 ML IM SUSY
0.5000 mL | PREFILLED_SYRINGE | Freq: Once | INTRAMUSCULAR | Status: AC
  Administered 2016-12-15: 0.5 mL via INTRAMUSCULAR
  Filled 2016-12-15: qty 0.5

## 2016-12-15 MED ORDER — SODIUM CHLORIDE 0.9 % IV SOLN
200.0000 mg | Freq: Once | INTRAVENOUS | Status: AC
  Administered 2016-12-15: 200 mg via INTRAVENOUS
  Filled 2016-12-15: qty 10

## 2016-12-15 MED ORDER — ATROPINE SULFATE 1 MG/ML IJ SOLN: 0.5000 mg | Freq: Once | INTRAMUSCULAR | Status: DC | PRN

## 2016-12-15 MED ORDER — PALONOSETRON HCL INJECTION 0.25 MG/5ML
0.2500 mg | Freq: Once | INTRAVENOUS | Status: AC
  Administered 2016-12-15: 0.25 mg via INTRAVENOUS

## 2016-12-15 MED ORDER — PALONOSETRON HCL INJECTION 0.25 MG/5ML
INTRAVENOUS | Status: AC
  Filled 2016-12-15: qty 5

## 2016-12-15 MED ORDER — SODIUM CHLORIDE 0.9 % IJ SOLN
10.0000 mL | INTRAMUSCULAR | Status: DC | PRN
  Administered 2016-12-15: 10 mL via INTRAVENOUS
  Filled 2016-12-15: qty 10

## 2016-12-15 MED ORDER — IRINOTECAN HCL CHEMO INJECTION 100 MG/5ML
240.0000 mg | Freq: Once | INTRAVENOUS | Status: AC
  Administered 2016-12-15: 240 mg via INTRAVENOUS
  Filled 2016-12-15: qty 10

## 2016-12-15 NOTE — Patient Instructions (Signed)
[employer] Discharge Instructions for Patients Receiving Chemotherapy  Today you received the following chemotherapy agents panitumumab (Vectibix), irinotecan (Camptosar), Leucovorin, fluorouracil (Adrucil or 5FU).  To help prevent nausea and vomiting after your treatment, we encourage you to take your nausea medication   If you develop nausea and vomiting that is not controlled by your nausea medication, call the clinic.   BELOW ARE SYMPTOMS THAT SHOULD BE REPORTED IMMEDIATELY:  *FEVER GREATER THAN 100.5 F  *CHILLS WITH OR WITHOUT FEVER  NAUSEA AND VOMITING THAT IS NOT CONTROLLED WITH YOUR NAUSEA MEDICATION  *UNUSUAL SHORTNESS OF BREATH  *UNUSUAL BRUISING OR BLEEDING  TENDERNESS IN MOUTH AND THROAT WITH OR WITHOUT PRESENCE OF ULCERS  *URINARY PROBLEMS  *BOWEL PROBLEMS  UNUSUAL RASH Items with * indicate a potential emergency and should be followed up as soon as possible.  Feel free to call the clinic you have any questions or concerns. The clinic phone number is [phone].  Please show the [place] at check-in to the Emergency Department and triage nurse.

## 2016-12-15 NOTE — Progress Notes (Signed)
[place] OFFICE PROGRESS NOTE   Diagnosis:  Rectal cancer  INTERVAL HISTORY:   Jacob [name] returns as scheduled. He completed cycle 3 FOLFIRI/PANITUMUMAB 12/02/2016. He had mild nausea. No vomiting. He did not experience constipation following the most recent cycle. He has intermittent loose stools. He noted that his mouth and lips became "sensitive". No ulcerations. He forgot to begin potassium as previously prescribed. Skin rash has improved over the past 2 days.  Objective:  Vital signs in last 24 hours:  Blood pressure 110/70, pulse 80, temperature 98.5 F (36.9 C), temperature source Oral, height _0  (1.778 m), weight 142 lb 11.2 oz (64.7 kg).    HEENT: No thrush or ulcers. Resp: Lungs clear bilaterally. Cardio: Regular rate and rhythm. GI: Abdomen soft and nontender. No hepatomegaly. Left lower quadrant colostomy. Vascular: No leg edema. Skin: Faint acne-type rash on the face and trunk. Port-A-Cath without erythema.    Lab Results:  Lab Results  Component Value Date   WBC 6.7 12/15/2016   HGB 10.0 (L) 12/15/2016   HCT 31.0 (L) 12/15/2016   MCV 79.7 12/15/2016   PLT 152 12/15/2016   NEUTROABS 4.9 12/15/2016    Imaging:  No results found.  Medications: I have reviewed the patient's current medications.  Assessment/Plan: 1. Rectal cancer. Partially obstructing mass noted 1-2 cm from the anal verge on a colonoscopy 03/06/2012. Endoscopic ultrasound 03/14/2012 with a 7.5 mm thick, 3.2 cm wide hypoechoic, irregularly bordered mass that clearly passed into and through the muscularis propria layer of the distal rectal wall (uT3); 3 small (largest 7 mm) perirectal lymph nodes. The lymph nodes were all round, discrete, hypoechoic, homogenous; suspicious for malignant involvement (uN1).  No RAS mutation identified by [facility] 1 testing on the colon resection specimen 06/29/2012, APC alteration identified, microsatellite stable  He began radiation  and concurrent Xeloda chemotherapy on 03/20/2012, completed 04/27/2012.   Low anterior resection/coloanal anastomosis and diverting ileostomy 06/29/2012 with the final pathology revealing a T2N0 tumor with extensive fibrosis and negative margins.   Cycle 1 of adjuvant CAPOX 07/19/2012. Cycle 5 of adjuvant CAPOX 10/11/2012.   CEA 2.5 on 11/14/2012.   CEA 14.6 03/08/2013.   Restaging CT evaluation 03/08/2013 with a 4 mm pulmonary nodule left lung base not identified on comparison exam; new liver lesions including a 29 x 26 mm irregular peripheral enhancing rounded lesion in the dome of the right hepatic lobe, a less well-defined new subcapsular lesion in the lateral right hepatic lobe measuring 12 mm, a new subcapsular lesion in the anterior right hepatic lobe adjacent to the gallbladder fossa measuring 10 mm; a rounded low-density lesion in the inferior right hepatic lobe measuring 10 mm compared to 7 mm on the prior study (radiologist commented this may represent an enlarged cyst).   MRI of the abdomen 04/12/2013 confirmed multiple T2 hyperintense metastatic lesions throughout the right liver   Initiation of FOLFIRI/Avastin with genotype based irinotecan dosing per the [facility] study 04/05/2013.   Restaging CT evaluation on 06/20/2013 (after 2 cycles/4 treatments) showed improvement in the liver metastases and stable size of a left lower lobe pulmonary nodule now with central cavitation.   Continuation of FOLFIRI/Avastin.   Restaging CT evaluation 08/14/2013-decrease in the size of liver metastases, slight decrease in the size of a cavitary left lower lobe nodule, no evidence of disease progression.   Status post right hepatic lobectomy 09/17/2013. Pathology showed multiple foci of metastatic adenocarcinoma (5 nodules of metastatic adenocarcinoma 4 of which are subcapsular with the  nodules ranging in size from 0.6-1.8 cm in greatest dimension). Margins not involved. Biopsy of a portal  lymph node showed benign adipose tissue; no lymph node tissue or malignancy.   Normal CEA 11/06/2013   CT 11/06/2013 with a new pleural-based right lower chest lesion, slight in enlargement of the a left-sided lung nodule  CT 01/29/2014 with a decrease in the right lower chest pleural-based lesion and a slight increase of a left-sided lung lesion, other lung lesions were stable  CT chest 05/02/2014 with a decrease in the size of a right lower lobe pulmonary nodule months similar size of a dominant left lower lobe nodule, minimal enlargement of a smaller left lower lobe nodule, no new site of disease  CT chest 11/07/2014 with a slight increase in several left-sided lung nodules  CT chest 02/10/2015 with new small left hilar lymph nodes, a possible new left lower lobe nodule, and stability of other lung nodules  PET scan 03/12/2015 with hypermetabolic left hilar nodes, hypermetabolic left lower lobe nodules, hyper metabolic retroperitoneal nodes, intense hypermetabolism at the coloanal anastomosis, and hypermetabolic thickening in the presacral space  Cycle 1 FOLFIRI/PANITUMUMAB 05/21/2015  Cycle 2 FOLFIRI/PANITUMUMAB 06/05/2015  Cycle 3 FOLFIRI/PANITUMUMAB 06/19/2015  Cycle 4 FOLFIRI 07/10/2015  Cycle 5 FOLFIRI 07/24/2015  Restaging CTs 08/06/2015-resolution of hilar/retroperitoneal adenopathy, improvement in the hypermetabolic lung nodule, other lung nodules are stable, no new lesions  Cycle 6 FOLFIRI with PANITUMUMAB 08/28/2015 -panitumumab dose reduced  Cycle 7 FOLFIRI 09/11/2015-no panitumumab given  Cycle 8 FOLFIRI with PANITUMUMAB 09/25/2015-PANITUMUMAB dose reduced  Cycle 9 FOLFIRI 10/09/2015-no PANITUMUMAB given  Cycle 10 FOLFIRI with panitumumab 10/23/2015  CTs 11/06/2015-possible slight enlargement of a left lower lobe nodule, no other evidence of disease progression.  CTs 02/16/2016-enlargement of left-sided pulmonary nodules and mediastinal/left hilar nodes,  improved splenomegaly  CTs 06/21/2016-increased size of mediastinal/left hilar nodes, increased left lung nodules, and increased soft tissue at the porta hepatis  CTs 10/28/2016-progressive disease in the chest and abdomen with an enlarging left hilar mass and slight interval enlargement of pulmonary lesions. Right upper quadrant necrotic nodal mass significantly increased in size with mass effect on the liver and possible invasion. Significant compression of the intrahepatic IVC. New right hepatic lobe lesion. Moderate pelvic ascites.  Cycle 1 FOLFIRI/PANITUMUMAB 11/04/2016  Cycle 2 FOLFIRI/PANITUMUMAB 11/18/2016  Cycle 3 FOLFIRI/panitumumab 12/02/2016  Ankle 4 FOLFIRI/PANITUMUMAB 12/15/2016 2. Irregular bowel habits/rectal bleeding secondary to #1. 3. History of Mild elevation of the liver enzymes. Question secondary to hepatic steatosis. 4. Indeterminate 8 mm posterior right liver lesion on the staging CT 03/06/2012. 5. Mildly elevated CEA at 5.7 on 03/06/2012. 6. History of radiation erythema at the groin and perineum 7. 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 No clinical evidence of thrombophlebitis or extravasation. 8. Delayed nausea following chemotherapy-Decadron prophylaxis was added with cycle 3 CAPOX-improved. 9. Ileostomy takedown 12/07/2012. 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 11. Port-A-Cath placement 12/31/204 12. Severe neutropenia secondary to chemotherapy following cycle 1 of FOLFIRI, chemotherapy was dose reduced and he received Neulasta with day 15 cycle 1  13. Nausea and vomiting following cycle 1 of FOLFIRI 14. Rectal stricture-manual/colonoscopic dilatation by Dr. Ardis Hughs 03/01/2016  APR 08/17/2016-no evidence of malignancy 15. Leukopenia/Thrombocytopenia-persistent, potentially a sequelae of chemotherapy or hepatic toxicity from chemotherapy/radiation. Bone marrow biopsy  11/20/2014 showed cellular bone marrow with trilineage hematopoiesis. Significant dyspoiesis was not present and there was no evidence of metastatic carcinoma. Cytogenetic analysis showed the presence of normal male chromosomes with  no observable clonal chromosomal abnormalities.  probable cirrhosis 16. Genetic testing-negative genetic panel in March 2014 17. 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 Severe over the face-steroid Dosepak prescribed 07/03/2015. Improved 07/10/2015. Further improved 07/24/2015, 08/07/2015, 08/28/2015 18. Diarrhea secondary to chemotherapy-encouraged to use Imodium 07/03/2015 19. History of Paronychia secondary to PANITUMUMAB 20. 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 He has completed 3 cycles of FOLFIRI/PANITUMUMAB. Plan to proceed with cycle 4 today as scheduled. The plan is for restaging CT scans after 5 cycles.  He will return for a follow-up visit and cycle 5 FOLFIRI/PANITUMUMAB in 2 weeks. He will contact the office in the interim with any problems.    Ned Card ANP/GNP-BC   12/15/2016  12:53 PM

## 2016-12-15 NOTE — Telephone Encounter (Signed)
Gave patient avs report and appointments for September and October. Central radiology will call re scan.

## 2016-12-17 ENCOUNTER — Ambulatory Visit (HOSPITAL_BASED_OUTPATIENT_CLINIC_OR_DEPARTMENT_OTHER): Payer: BC Managed Care – PPO

## 2016-12-17 VITALS — BP 121/79 | HR 67 | Temp 97.5°F | Resp 18

## 2016-12-17 DIAGNOSIS — C787 Secondary malignant neoplasm of liver and intrahepatic bile duct: Secondary | ICD-10-CM

## 2016-12-17 DIAGNOSIS — C20 Malignant neoplasm of rectum: Secondary | ICD-10-CM

## 2016-12-17 DIAGNOSIS — Z5189 Encounter for other specified aftercare: Secondary | ICD-10-CM | POA: Diagnosis not present

## 2016-12-17 MED ORDER — PEGFILGRASTIM INJECTION 6 MG/0.6ML ~~LOC~~
6.0000 mg | PREFILLED_SYRINGE | Freq: Once | SUBCUTANEOUS | Status: AC
  Administered 2016-12-17: 6 mg via SUBCUTANEOUS
  Filled 2016-12-17: qty 0.6

## 2016-12-17 MED ORDER — SODIUM CHLORIDE 0.9 % IJ SOLN
10.0000 mL | INTRAMUSCULAR | Status: DC | PRN
  Administered 2016-12-17: 10 mL
  Filled 2016-12-17: qty 10

## 2016-12-17 MED ORDER — HEPARIN SOD (PORK) LOCK FLUSH 100 UNIT/ML IV SOLN
500.0000 [IU] | Freq: Once | INTRAVENOUS | Status: AC | PRN
  Administered 2016-12-17: 500 [IU]
  Filled 2016-12-17: qty 5

## 2016-12-24 ENCOUNTER — Other Ambulatory Visit: Payer: Self-pay | Admitting: Oncology

## 2016-12-30 ENCOUNTER — Other Ambulatory Visit (HOSPITAL_BASED_OUTPATIENT_CLINIC_OR_DEPARTMENT_OTHER): Payer: BC Managed Care – PPO

## 2016-12-30 ENCOUNTER — Ambulatory Visit (HOSPITAL_BASED_OUTPATIENT_CLINIC_OR_DEPARTMENT_OTHER): Payer: BC Managed Care – PPO | Admitting: Nurse Practitioner

## 2016-12-30 ENCOUNTER — Ambulatory Visit (HOSPITAL_BASED_OUTPATIENT_CLINIC_OR_DEPARTMENT_OTHER): Payer: BC Managed Care – PPO

## 2016-12-30 ENCOUNTER — Ambulatory Visit: Payer: BC Managed Care – PPO

## 2016-12-30 VITALS — BP 108/72 | HR 70 | Temp 97.7°F | Resp 18 | Ht 70.0 in | Wt 140.5 lb

## 2016-12-30 DIAGNOSIS — M545 Low back pain: Secondary | ICD-10-CM | POA: Diagnosis not present

## 2016-12-30 DIAGNOSIS — Z5111 Encounter for antineoplastic chemotherapy: Secondary | ICD-10-CM | POA: Diagnosis not present

## 2016-12-30 DIAGNOSIS — Z95828 Presence of other vascular implants and grafts: Secondary | ICD-10-CM

## 2016-12-30 DIAGNOSIS — C787 Secondary malignant neoplasm of liver and intrahepatic bile duct: Secondary | ICD-10-CM

## 2016-12-30 DIAGNOSIS — C20 Malignant neoplasm of rectum: Secondary | ICD-10-CM

## 2016-12-30 DIAGNOSIS — Z5112 Encounter for antineoplastic immunotherapy: Secondary | ICD-10-CM

## 2016-12-30 DIAGNOSIS — R21 Rash and other nonspecific skin eruption: Secondary | ICD-10-CM

## 2016-12-30 DIAGNOSIS — H539 Unspecified visual disturbance: Secondary | ICD-10-CM

## 2016-12-30 LAB — CBC WITH DIFFERENTIAL/PLATELET
BASO%: 0.8 % (ref 0.0–2.0)
Basophils Absolute: 0 10*3/uL (ref 0.0–0.1)
EOS ABS: 0.2 10*3/uL (ref 0.0–0.5)
EOS%: 3.1 % (ref 0.0–7.0)
HCT: 33.1 % — ABNORMAL LOW (ref 38.4–49.9)
HEMOGLOBIN: 10.7 g/dL — AB (ref 13.0–17.1)
LYMPH%: 11.4 % — ABNORMAL LOW (ref 14.0–49.0)
MCH: 26.1 pg — ABNORMAL LOW (ref 27.2–33.4)
MCHC: 32.4 g/dL (ref 32.0–36.0)
MCV: 80.6 fL (ref 79.3–98.0)
MONO#: 0.5 10*3/uL (ref 0.1–0.9)
MONO%: 7.9 % (ref 0.0–14.0)
NEUT%: 76.8 % — ABNORMAL HIGH (ref 39.0–75.0)
NEUTROS ABS: 4.5 10*3/uL (ref 1.5–6.5)
Platelets: 151 10*3/uL (ref 140–400)
RBC: 4.11 10*6/uL — ABNORMAL LOW (ref 4.20–5.82)
RDW: 22.6 % — AB (ref 11.0–14.6)
WBC: 5.9 10*3/uL (ref 4.0–10.3)
lymph#: 0.7 10*3/uL — ABNORMAL LOW (ref 0.9–3.3)

## 2016-12-30 LAB — COMPREHENSIVE METABOLIC PANEL
ALBUMIN: 3.4 g/dL — AB (ref 3.5–5.0)
ALT: 15 U/L (ref 0–55)
AST: 15 U/L (ref 5–34)
Alkaline Phosphatase: 84 U/L (ref 40–150)
Anion Gap: 8 mEq/L (ref 3–11)
BUN: 12.4 mg/dL (ref 7.0–26.0)
CO2: 24 meq/L (ref 22–29)
Calcium: 8.6 mg/dL (ref 8.4–10.4)
Chloride: 111 mEq/L — ABNORMAL HIGH (ref 98–109)
Creatinine: 0.8 mg/dL (ref 0.7–1.3)
EGFR: >60 mL/min/{1.73_m2} (ref >60)
GLUCOSE: 84 mg/dL (ref 70–140)
Potassium: 3.3 mEq/L — ABNORMAL LOW (ref 3.5–5.1)
SODIUM: 143 meq/L (ref 136–145)
TOTAL PROTEIN: 6 g/dL — AB (ref 6.4–8.3)

## 2016-12-30 LAB — MAGNESIUM: MAGNESIUM: 2.5 mg/dL (ref 1.5–2.5)

## 2016-12-30 MED ORDER — SODIUM CHLORIDE 0.9 % IV SOLN
Freq: Once | INTRAVENOUS | Status: AC
  Administered 2016-12-30: 12:00:00 via INTRAVENOUS
  Filled 2016-12-30: qty 5

## 2016-12-30 MED ORDER — PALONOSETRON HCL INJECTION 0.25 MG/5ML
0.2500 mg | Freq: Once | INTRAVENOUS | Status: AC
  Administered 2016-12-30: 0.25 mg via INTRAVENOUS

## 2016-12-30 MED ORDER — IRINOTECAN HCL CHEMO INJECTION 100 MG/5ML
135.0000 mg/m2 | Freq: Once | INTRAVENOUS | Status: AC
  Administered 2016-12-30: 240 mg via INTRAVENOUS
  Filled 2016-12-30: qty 12

## 2016-12-30 MED ORDER — PALONOSETRON HCL INJECTION 0.25 MG/5ML
INTRAVENOUS | Status: AC
  Filled 2016-12-30: qty 5

## 2016-12-30 MED ORDER — SODIUM CHLORIDE 0.9 % IV SOLN
2400.0000 mg/m2 | INTRAVENOUS | Status: DC
  Administered 2016-12-30: 4300 mg via INTRAVENOUS
  Filled 2016-12-30: qty 86

## 2016-12-30 MED ORDER — SODIUM CHLORIDE 0.9 % IV SOLN
Freq: Once | INTRAVENOUS | Status: AC
  Administered 2016-12-30: 12:00:00 via INTRAVENOUS

## 2016-12-30 MED ORDER — LEUCOVORIN CALCIUM INJECTION 350 MG
400.0000 mg/m2 | Freq: Once | INTRAVENOUS | Status: AC
  Administered 2016-12-30: 716 mg via INTRAVENOUS
  Filled 2016-12-30: qty 35.8

## 2016-12-30 MED ORDER — ATROPINE SULFATE 1 MG/ML IJ SOLN: 0.5000 mg | Freq: Once | INTRAMUSCULAR | Status: DC | PRN

## 2016-12-30 MED ORDER — PANITUMUMAB CHEMO INJECTION 100 MG/5ML
3.0000 mg/kg | Freq: Once | INTRAVENOUS | Status: AC
  Administered 2016-12-30: 200 mg via INTRAVENOUS
  Filled 2016-12-30: qty 10

## 2016-12-30 MED ORDER — SODIUM CHLORIDE 0.9 % IJ SOLN
10.0000 mL | INTRAMUSCULAR | Status: DC | PRN
  Administered 2016-12-30: 10 mL via INTRAVENOUS
  Filled 2016-12-30: qty 10

## 2016-12-30 NOTE — Patient Instructions (Signed)
Implanted Port Home Guide An implanted port is a type of central line that is placed under the skin. Central lines are used to provide IV access when treatment or nutrition needs to be given through a person's veins. Implanted ports are used for long-term IV access. An implanted port may be placed because:  You need IV medicine that would be irritating to the small veins in your hands or arms.  You need long-term IV medicines, such as antibiotics.  You need IV nutrition for a long period.  You need frequent blood draws for lab tests.  You need dialysis.  Implanted ports are usually placed in the chest area, but they can also be placed in the upper arm, the abdomen, or the leg. An implanted port has two main parts:  Reservoir. The reservoir is round and will appear as a small, raised area under your skin. The reservoir is the part where a needle is inserted to give medicines or draw blood.  Catheter. The catheter is a thin, flexible tube that extends from the reservoir. The catheter is placed into a large vein. Medicine that is inserted into the reservoir goes into the catheter and then into the vein.  How will I care for my incision site? Do not get the incision site wet. Bathe or shower as directed by your health care provider. How is my port accessed? Special steps must be taken to access the port:  Before the port is accessed, a numbing cream can be placed on the skin. This helps numb the skin over the port site.  Your health care provider uses a sterile technique to access the port. ? Your health care provider must put on a mask and sterile gloves. ? The skin over your port is cleaned carefully with an antiseptic and allowed to dry. ? The port is gently pinched between sterile gloves, and a needle is inserted into the port.  Only "non-coring" port needles should be used to access the port. Once the port is accessed, a blood return should be checked. This helps ensure that the port  is in the vein and is not clogged.  If your port needs to remain accessed for a constant infusion, a clear (transparent) bandage will be placed over the needle site. The bandage and needle will need to be changed every week, or as directed by your health care provider.  Keep the bandage covering the needle clean and dry. Do not get it wet. Follow your health care provider's instructions on how to take a shower or bath while the port is accessed.  If your port does not need to stay accessed, no bandage is needed over the port.  What is flushing? Flushing helps keep the port from getting clogged. Follow your health care provider's instructions on how and when to flush the port. Ports are usually flushed with saline solution or a medicine called heparin. The need for flushing will depend on how the port is used.  If the port is used for intermittent medicines or blood draws, the port will need to be flushed: ? After medicines have been given. ? After blood has been drawn. ? As part of routine maintenance.  If a constant infusion is running, the port may not need to be flushed.  How long will my port stay implanted? The port can stay in for as long as your health care provider thinks it is needed. When it is time for the port to come out, surgery will be   done to remove it. The procedure is similar to the one performed when the port was put in. When should I seek immediate medical care? When you have an implanted port, you should seek immediate medical care if:  You notice a bad smell coming from the incision site.  You have swelling, redness, or drainage at the incision site.  You have more swelling or pain at the port site or the surrounding area.  You have a fever that is not controlled with medicine.  This information is not intended to replace advice given to you by your health care provider. Make sure you discuss any questions you have with your health care provider. Document  Released: 03/08/2005 Document Revised: 08/14/2015 Document Reviewed: 11/13/2012 Elsevier Interactive Patient Education  2017 Elsevier Inc.  

## 2016-12-30 NOTE — Progress Notes (Signed)
[place] OFFICE PROGRESS NOTE   Diagnosis:  Rectal cancer  INTERVAL HISTORY:   Mr. Hamre returns as scheduled. He completed cycle 4 FOLFIRI/PANITUMUMAB 12/15/2016. He had mild nausea and mild constipation after the chemotherapy. Mouth became sensitive. No discrete ulcers. He notes a dry mouth for several days after chemotherapy. Rash has improved over the past 1-2 days. He has had a few episodes of seeing "flashing lights". No diplopia. No unusual headaches. For the past 4 nights when he lays down he develops back pain. The pain goes away with standing. He has not noticed the pain if he lays down during the day. He took oxycodone last night with good relief.  Objective:  Vital signs in last 24 hours:  Blood pressure 108/72, pulse 70, temperature 97.7 F (36.5 C), temperature source Oral, resp. rate 18, height _0  (1.778 m), weight 140 lb 8 oz (63.7 kg), SpO2 100 %.    HEENT: PERRLA. Extraocular movements intact. Sclera anicteric. Mild white coating over tongue. No buccal thrush. Resp: Lungs clear bilaterally. Cardio: Regular rate and rhythm. GI: Abdomen soft and nontender. No hepatomegaly. Left lower quadrant colostomy. Vascular: No leg edema. Skin: Mild acne type rash scattered over the face. Port-A-Cath without erythema.    Lab Results:  Lab Results  Component Value Date   WBC 5.9 12/30/2016   HGB 10.7 (L) 12/30/2016   HCT 33.1 (L) 12/30/2016   MCV 80.6 12/30/2016   PLT 151 12/30/2016   NEUTROABS 4.5 12/30/2016    Imaging:  No results found.  Medications: I have reviewed the patient's current medications.  Assessment/Plan: 1. Rectal cancer. Partially obstructing mass noted 1-2 cm from the anal verge on a colonoscopy 03/06/2012. Endoscopic ultrasound 03/14/2012 with a 7.5 mm thick, 3.2 cm wide hypoechoic, irregularly bordered mass that clearly passed into and through the muscularis propria layer of the distal rectal wall (uT3); 3 small (largest 7  mm) perirectal lymph nodes. The lymph nodes were all round, discrete, hypoechoic, homogenous; suspicious for malignant involvement (uN1).  No RAS mutation identified by [facility] 1 testing on the colon resection specimen 06/29/2012, APC alteration identified, microsatellite stable  He began radiation and concurrent Xeloda chemotherapy on 03/20/2012, completed 04/27/2012.   Low anterior resection/coloanal anastomosis and diverting ileostomy 06/29/2012 with the final pathology revealing a T2N0 tumor with extensive fibrosis and negative margins.   Cycle 1 of adjuvant CAPOX 07/19/2012. Cycle 5 of adjuvant CAPOX 10/11/2012.   CEA 2.5 on 11/14/2012.   CEA 14.6 03/08/2013.   Restaging CT evaluation 03/08/2013 with a 4 mm pulmonary nodule left lung base not identified on comparison exam; new liver lesions including a 29 x 26 mm irregular peripheral enhancing rounded lesion in the dome of the right hepatic lobe, a less well-defined new subcapsular lesion in the lateral right hepatic lobe measuring 12 mm, a new subcapsular lesion in the anterior right hepatic lobe adjacent to the gallbladder fossa measuring 10 mm; a rounded low-density lesion in the inferior right hepatic lobe measuring 10 mm compared to 7 mm on the prior study (radiologist commented this may represent an enlarged cyst).   MRI of the abdomen 04/12/2013 confirmed multiple T2 hyperintense metastatic lesions throughout the right liver   Initiation of FOLFIRI/Avastin with genotype based irinotecan dosing per the [facility] study 04/05/2013.   Restaging CT evaluation on 06/20/2013 (after 2 cycles/4 treatments) showed improvement in the liver metastases and stable size of a left lower lobe pulmonary nodule now with central cavitation.   Continuation of FOLFIRI/Avastin.  Restaging CT evaluation 08/14/2013-decrease in the size of liver metastases, slight decrease in the size of a cavitary left lower lobe nodule, no evidence of disease  progression.   Status post right hepatic lobectomy 09/17/2013. Pathology showed multiple foci of metastatic adenocarcinoma (5 nodules of metastatic adenocarcinoma 4 of which are subcapsular with the nodules ranging in size from 0.6-1.8 cm in greatest dimension). Margins not involved. Biopsy of a portal lymph node showed benign adipose tissue; no lymph node tissue or malignancy.   Normal CEA 11/06/2013   CT 11/06/2013 with a new pleural-based right lower chest lesion, slight in enlargement of the a left-sided lung nodule  CT 01/29/2014 with a decrease in the right lower chest pleural-based lesion and a slight increase of a left-sided lung lesion, other lung lesions were stable  CT chest 05/02/2014 with a decrease in the size of a right lower lobe pulmonary nodule months similar size of a dominant left lower lobe nodule, minimal enlargement of a smaller left lower lobe nodule, no new site of disease  CT chest 11/07/2014 with a slight increase in several left-sided lung nodules  CT chest 02/10/2015 with new small left hilar lymph nodes, a possible new left lower lobe nodule, and stability of other lung nodules  PET scan 03/12/2015 with hypermetabolic left hilar nodes, hypermetabolic left lower lobe nodules, hyper metabolic retroperitoneal nodes, intense hypermetabolism at the coloanal anastomosis, and hypermetabolic thickening in the presacral space  Cycle 1 FOLFIRI/PANITUMUMAB 05/21/2015  Cycle 2 FOLFIRI/PANITUMUMAB 06/05/2015  Cycle 3 FOLFIRI/PANITUMUMAB 06/19/2015  Cycle 4 FOLFIRI 07/10/2015  Cycle 5 FOLFIRI 07/24/2015  Restaging CTs 08/06/2015-resolution of hilar/retroperitoneal adenopathy, improvement in the hypermetabolic lung nodule, other lung nodules are stable, no new lesions  Cycle 6 FOLFIRI with PANITUMUMAB 08/28/2015 -panitumumab dose reduced  Cycle 7 FOLFIRI 09/11/2015-no panitumumab given  Cycle 8 FOLFIRI with PANITUMUMAB 09/25/2015-PANITUMUMAB dose reduced  Cycle 9  FOLFIRI 10/09/2015-no PANITUMUMAB given  Cycle 10 FOLFIRI with panitumumab 10/23/2015  CTs 11/06/2015-possible slight enlargement of a left lower lobe nodule, no other evidence of disease progression.  CTs 02/16/2016-enlargement of left-sided pulmonary nodules and mediastinal/left hilar nodes, improved splenomegaly  CTs 06/21/2016-increased size of mediastinal/left hilar nodes, increased left lung nodules, and increased soft tissue at the porta hepatis  CTs 10/28/2016-progressive disease in the chest and abdomen with an enlarging left hilar mass and slight interval enlargement of pulmonary lesions. Right upper quadrant necrotic nodal mass significantly increased in size with mass effect on the liver and possible invasion. Significant compression of the intrahepatic IVC. New right hepatic lobe lesion. Moderate pelvic ascites.  Cycle 1 FOLFIRI/PANITUMUMAB 11/04/2016  Cycle 2 FOLFIRI/PANITUMUMAB 11/18/2016  Cycle 3 FOLFIRI/panitumumab 12/02/2016  Cycle 4 FOLFIRI/PANITUMUMAB 12/15/2016  Cycle 5 FOLFIRI/PANITUMUMAB 12/30/2016 2. Irregular bowel habits/rectal bleeding secondary to #1. 3. History of Mild elevation of the liver enzymes. Question secondary to hepatic steatosis. 4. Indeterminate 8 mm posterior right liver lesion on the staging CT 03/06/2012. 5. Mildly elevated CEA at 5.7 on 03/06/2012. 6. History of radiation erythema at the groin and perineum 7. 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 No clinical evidence of thrombophlebitis or extravasation. 8. Delayed nausea following chemotherapy-Decadron prophylaxis was added with cycle 3 CAPOX-improved. 9. Ileostomy takedown 12/07/2012. 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 11. Port-A-Cath placement 12/31/204 12. Severe neutropenia secondary to chemotherapy following cycle 1 of FOLFIRI, chemotherapy was dose reduced and he received Neulasta with  day 15 cycle 1  13. Nausea and vomiting following cycle 1 of FOLFIRI 14.  Rectal stricture-manual/colonoscopic dilatation by Dr. Ardis Hughs 03/01/2016  APR 08/17/2016-no evidence of malignancy 15. Leukopenia/Thrombocytopenia-persistent, potentially a sequelae of chemotherapy or hepatic toxicity from chemotherapy/radiation. Bone marrow biopsy 11/20/2014 showed cellular bone marrow with trilineage hematopoiesis. Significant dyspoiesis was not present and there was no evidence of metastatic carcinoma. Cytogenetic analysis showed the presence of normal male chromosomes with no observable clonal chromosomal abnormalities.  probable cirrhosis 16. Genetic testing-negative genetic panel in March 2014 17. 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 Severe over the face-steroid Dosepak prescribed 07/03/2015. Improved 07/10/2015. Further improved 07/24/2015, 08/07/2015, 08/28/2015 18. Diarrhea secondary to chemotherapy-encouraged to use Imodium 07/03/2015 19. History of Paronychia secondary to PANITUMUMAB 20. 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 Ratterman appears stable. He has completed 4 cycles of FOLFIRI/PANITUMUMAB. Plan to proceed with cycle 5 today as scheduled. He will have restaging CT scans 01/06/2017. He will return for a follow-up visit 01/13/2017. He will contact the office in the interim with any problems. We specifically discussed visual disturbance, worsening back pain.  Plan reviewed with Dr. Benay Spice.     Ned Card ANP/GNP-BC   12/30/2016  10:41 AM

## 2016-12-30 NOTE — Patient Instructions (Signed)
[place] Discharge Instructions for Patients Receiving Chemotherapy  Today you received the following chemotherapy agents Vectibix,Camptosar, Leucovorin, Adrucil  To help prevent nausea and vomiting after your treatment, we encourage you to take your nausea medication   If you develop nausea and vomiting that is not controlled by your nausea medication, call the clinic.   BELOW ARE SYMPTOMS THAT SHOULD BE REPORTED IMMEDIATELY:  *FEVER GREATER THAN 100.5 F  *CHILLS WITH OR WITHOUT FEVER  NAUSEA AND VOMITING THAT IS NOT CONTROLLED WITH YOUR NAUSEA MEDICATION  *UNUSUAL SHORTNESS OF BREATH  *UNUSUAL BRUISING OR BLEEDING  TENDERNESS IN MOUTH AND THROAT WITH OR WITHOUT PRESENCE OF ULCERS  *URINARY PROBLEMS  *BOWEL PROBLEMS  UNUSUAL RASH Items with * indicate a potential emergency and should be followed up as soon as possible.  Feel free to call the clinic you have any questions or concerns. The clinic phone number is [phone].  Please show the [place] at check-in to the Emergency Department and triage nurse.

## 2016-12-31 ENCOUNTER — Telehealth: Payer: Self-pay | Admitting: Emergency Medicine

## 2016-12-31 ENCOUNTER — Telehealth: Payer: Self-pay | Admitting: *Deleted

## 2016-12-31 NOTE — Telephone Encounter (Signed)
Voicemail: "Returning a call to [place] with Dr. Benay Spice.  Need to see what it's about."

## 2016-12-31 NOTE — Telephone Encounter (Addendum)
Spoke with patient regarding this note from Ned Card, NP. Patient verbalized understanding of taking Kdur 32meq daily. Pt already has a k+ rx and does not need another at this time.   ----- Message from Owens Shark, NP sent at 12/30/2016  4:23 PM EDT ----- Please let patient know potassium is mildly low. He should begin Kdur 20 meq daily.

## 2017-01-01 ENCOUNTER — Ambulatory Visit (HOSPITAL_BASED_OUTPATIENT_CLINIC_OR_DEPARTMENT_OTHER): Payer: BC Managed Care – PPO

## 2017-01-01 VITALS — BP 101/70 | HR 70 | Temp 98.7°F | Resp 18

## 2017-01-01 DIAGNOSIS — Z7189 Other specified counseling: Secondary | ICD-10-CM

## 2017-01-01 DIAGNOSIS — C787 Secondary malignant neoplasm of liver and intrahepatic bile duct: Principal | ICD-10-CM

## 2017-01-01 DIAGNOSIS — C20 Malignant neoplasm of rectum: Secondary | ICD-10-CM

## 2017-01-01 MED ORDER — HEPARIN SOD (PORK) LOCK FLUSH 100 UNIT/ML IV SOLN
500.0000 [IU] | Freq: Once | INTRAVENOUS | Status: AC | PRN
  Administered 2017-01-01: 500 [IU]
  Filled 2017-01-01: qty 5

## 2017-01-01 MED ORDER — SODIUM CHLORIDE 0.9 % IJ SOLN
10.0000 mL | INTRAMUSCULAR | Status: DC | PRN
  Administered 2017-01-01: 10 mL
  Filled 2017-01-01: qty 10

## 2017-01-01 MED ORDER — PEGFILGRASTIM INJECTION 6 MG/0.6ML ~~LOC~~
6.0000 mg | PREFILLED_SYRINGE | Freq: Once | SUBCUTANEOUS | Status: AC
  Administered 2017-01-01: 6 mg via SUBCUTANEOUS

## 2017-01-03 ENCOUNTER — Telehealth: Payer: Self-pay | Admitting: Oncology

## 2017-01-03 NOTE — Telephone Encounter (Signed)
Scheduled appt per 10/11 los - patient to get an updated schedule next scheduled visit. - patient also my chart active.

## 2017-01-06 ENCOUNTER — Encounter (HOSPITAL_COMMUNITY): Payer: Self-pay

## 2017-01-06 ENCOUNTER — Ambulatory Visit (HOSPITAL_COMMUNITY)
Admission: RE | Admit: 2017-01-06 | Discharge: 2017-01-06 | Disposition: A | Discharge status: Home / Self Care | Payer: BC Managed Care – PPO | Source: Ambulatory Visit | Attending: Nurse Practitioner | Admitting: Nurse Practitioner

## 2017-01-06 DIAGNOSIS — C20 Malignant neoplasm of rectum: Secondary | ICD-10-CM | POA: Diagnosis not present

## 2017-01-06 DIAGNOSIS — R161 Splenomegaly, not elsewhere classified: Secondary | ICD-10-CM | POA: Insufficient documentation

## 2017-01-06 DIAGNOSIS — Z933 Colostomy status: Secondary | ICD-10-CM | POA: Diagnosis not present

## 2017-01-06 DIAGNOSIS — M5137 Other intervertebral disc degeneration, lumbosacral region: Secondary | ICD-10-CM | POA: Insufficient documentation

## 2017-01-06 DIAGNOSIS — C787 Secondary malignant neoplasm of liver and intrahepatic bile duct: Secondary | ICD-10-CM | POA: Diagnosis present

## 2017-01-06 DIAGNOSIS — C771 Secondary and unspecified malignant neoplasm of intrathoracic lymph nodes: Secondary | ICD-10-CM | POA: Diagnosis not present

## 2017-01-06 MED ORDER — IOPAMIDOL (ISOVUE-300) INJECTION 61%
100.0000 mL | Freq: Once | INTRAVENOUS | Status: AC | PRN
  Administered 2017-01-06: 100 mL via INTRAVENOUS

## 2017-01-06 MED ORDER — HEPARIN SOD (PORK) LOCK FLUSH 100 UNIT/ML IV SOLN
500.0000 [IU] | Freq: Once | INTRAVENOUS | Status: AC
  Administered 2017-01-06: 500 [IU] via INTRAVENOUS

## 2017-01-06 MED ORDER — IOPAMIDOL (ISOVUE-300) INJECTION 61%
INTRAVENOUS | Status: AC
  Filled 2017-01-06: qty 100

## 2017-01-06 MED ORDER — HEPARIN SOD (PORK) LOCK FLUSH 100 UNIT/ML IV SOLN
INTRAVENOUS | Status: AC
  Filled 2017-01-06: qty 5

## 2017-01-07 ENCOUNTER — Telehealth: Payer: Self-pay | Admitting: Emergency Medicine

## 2017-01-07 NOTE — Telephone Encounter (Addendum)
Pt verbalized understanding of this note.   ----- Message from Ladell Pier, MD sent at 01/06/2017  6:51 PM EDT ----- Please call patient, CT looks better, plant to continue current treatment

## 2017-01-09 ENCOUNTER — Other Ambulatory Visit: Payer: Self-pay | Admitting: Oncology

## 2017-01-13 ENCOUNTER — Ambulatory Visit (HOSPITAL_BASED_OUTPATIENT_CLINIC_OR_DEPARTMENT_OTHER): Payer: BC Managed Care – PPO

## 2017-01-13 ENCOUNTER — Ambulatory Visit (HOSPITAL_BASED_OUTPATIENT_CLINIC_OR_DEPARTMENT_OTHER): Payer: BC Managed Care – PPO | Admitting: Oncology

## 2017-01-13 ENCOUNTER — Ambulatory Visit: Payer: BC Managed Care – PPO

## 2017-01-13 ENCOUNTER — Other Ambulatory Visit (HOSPITAL_BASED_OUTPATIENT_CLINIC_OR_DEPARTMENT_OTHER): Payer: BC Managed Care – PPO

## 2017-01-13 VITALS — BP 119/84 | HR 82 | Temp 97.6°F | Resp 20 | Ht 70.0 in | Wt 142.1 lb

## 2017-01-13 DIAGNOSIS — C787 Secondary malignant neoplasm of liver and intrahepatic bile duct: Secondary | ICD-10-CM

## 2017-01-13 DIAGNOSIS — C20 Malignant neoplasm of rectum: Secondary | ICD-10-CM | POA: Diagnosis not present

## 2017-01-13 DIAGNOSIS — Z5111 Encounter for antineoplastic chemotherapy: Secondary | ICD-10-CM

## 2017-01-13 DIAGNOSIS — M545 Low back pain: Secondary | ICD-10-CM | POA: Diagnosis not present

## 2017-01-13 DIAGNOSIS — R21 Rash and other nonspecific skin eruption: Secondary | ICD-10-CM

## 2017-01-13 DIAGNOSIS — R05 Cough: Secondary | ICD-10-CM | POA: Diagnosis not present

## 2017-01-13 DIAGNOSIS — Z5112 Encounter for antineoplastic immunotherapy: Secondary | ICD-10-CM | POA: Diagnosis not present

## 2017-01-13 DIAGNOSIS — Z95828 Presence of other vascular implants and grafts: Secondary | ICD-10-CM

## 2017-01-13 LAB — COMPREHENSIVE METABOLIC PANEL
ALBUMIN: 3.8 g/dL (ref 3.5–5.0)
ALK PHOS: 86 U/L (ref 40–150)
ALT: 11 U/L (ref 0–55)
AST: 14 U/L (ref 5–34)
Anion Gap: 9 mEq/L (ref 3–11)
BILIRUBIN TOTAL: 0.31 mg/dL (ref 0.20–1.20)
BUN: 11.4 mg/dL (ref 7.0–26.0)
CALCIUM: 8.8 mg/dL (ref 8.4–10.4)
CO2: 24 mEq/L (ref 22–29)
CREATININE: 1 mg/dL (ref 0.7–1.3)
Chloride: 107 mEq/L (ref 98–109)
EGFR: >60 mL/min/{1.73_m2} (ref >60)
Glucose: 81 mg/dl (ref 70–140)
Potassium: 3.5 mEq/L (ref 3.5–5.1)
Sodium: 141 mEq/L (ref 136–145)
Total Protein: 6.6 g/dL (ref 6.4–8.3)

## 2017-01-13 LAB — CBC WITH DIFFERENTIAL/PLATELET
BASO%: 0.2 % (ref 0.0–2.0)
BASOS ABS: 0 10*3/uL (ref 0.0–0.1)
EOS%: 2.6 % (ref 0.0–7.0)
Eosinophils Absolute: 0.1 10*3/uL (ref 0.0–0.5)
HEMATOCRIT: 34 % — AB (ref 38.4–49.9)
HEMOGLOBIN: 10.5 g/dL — AB (ref 13.0–17.1)
LYMPH#: 0.6 10*3/uL — AB (ref 0.9–3.3)
LYMPH%: 13.9 % — ABNORMAL LOW (ref 14.0–49.0)
MCH: 26.2 pg — AB (ref 27.2–33.4)
MCHC: 30.9 g/dL — ABNORMAL LOW (ref 32.0–36.0)
MCV: 84.8 fL (ref 79.3–98.0)
MONO#: 0.5 10*3/uL (ref 0.1–0.9)
MONO%: 11.2 % (ref 0.0–14.0)
NEUT#: 3 10*3/uL (ref 1.5–6.5)
NEUT%: 72.1 % (ref 39.0–75.0)
Platelets: 135 10*3/uL — ABNORMAL LOW (ref 140–400)
RBC: 4.01 10*6/uL — ABNORMAL LOW (ref 4.20–5.82)
RDW: 22.1 % — AB (ref 11.0–14.6)
WBC: 4.2 10*3/uL (ref 4.0–10.3)

## 2017-01-13 LAB — MAGNESIUM: MAGNESIUM: 2.2 mg/dL (ref 1.5–2.5)

## 2017-01-13 MED ORDER — ACETAMINOPHEN 325 MG PO TABS
650.0000 mg | ORAL_TABLET | Freq: Once | ORAL | Status: AC
  Administered 2017-01-13: 650 mg via ORAL

## 2017-01-13 MED ORDER — SODIUM CHLORIDE 0.9 % IV SOLN
Freq: Once | INTRAVENOUS | Status: AC
  Administered 2017-01-13: 10:00:00 via INTRAVENOUS

## 2017-01-13 MED ORDER — LEUCOVORIN CALCIUM INJECTION 350 MG
400.0000 mg/m2 | Freq: Once | INTRAMUSCULAR | Status: AC
  Administered 2017-01-13: 716 mg via INTRAVENOUS
  Filled 2017-01-13: qty 35.8

## 2017-01-13 MED ORDER — ACETAMINOPHEN 325 MG PO TABS
ORAL_TABLET | ORAL | Status: AC
  Filled 2017-01-13: qty 2

## 2017-01-13 MED ORDER — SODIUM CHLORIDE 0.9 % IJ SOLN
10.0000 mL | INTRAMUSCULAR | Status: DC | PRN
  Administered 2017-01-13: 10 mL via INTRAVENOUS
  Filled 2017-01-13: qty 10

## 2017-01-13 MED ORDER — DEXTROSE 5 % IV SOLN
135.0000 mg/m2 | Freq: Once | INTRAVENOUS | Status: AC
  Administered 2017-01-13: 240 mg via INTRAVENOUS
  Filled 2017-01-13: qty 12

## 2017-01-13 MED ORDER — ATROPINE SULFATE 1 MG/ML IJ SOLN
INTRAMUSCULAR | Status: AC
  Filled 2017-01-13: qty 1

## 2017-01-13 MED ORDER — SODIUM CHLORIDE 0.9 % IV SOLN: Freq: Once | INTRAVENOUS | Status: AC

## 2017-01-13 MED ORDER — OXYCODONE HCL 5 MG PO TABS: 5.0000 mg | ORAL_TABLET | Freq: Every day | ORAL | 0 refills | Status: DC

## 2017-01-13 MED ORDER — SODIUM CHLORIDE 0.9 % IV SOLN
2400.0000 mg/m2 | INTRAVENOUS | Status: DC
  Administered 2017-01-13: 4300 mg via INTRAVENOUS
  Filled 2017-01-13: qty 86

## 2017-01-13 MED ORDER — ATROPINE SULFATE 1 MG/ML IJ SOLN: 0.5000 mg | Freq: Once | INTRAMUSCULAR | Status: DC | PRN

## 2017-01-13 MED ORDER — HEPARIN SOD (PORK) LOCK FLUSH 100 UNIT/ML IV SOLN
500.0000 [IU] | Freq: Once | INTRAVENOUS | Status: DC | PRN
  Filled 2017-01-13: qty 5

## 2017-01-13 MED ORDER — PALONOSETRON HCL INJECTION 0.25 MG/5ML
0.2500 mg | Freq: Once | INTRAVENOUS | Status: AC
  Administered 2017-01-13: 0.25 mg via INTRAVENOUS

## 2017-01-13 MED ORDER — PANITUMUMAB CHEMO INJECTION 100 MG/5ML
3.0000 mg/kg | Freq: Once | INTRAVENOUS | Status: AC
  Administered 2017-01-13: 200 mg via INTRAVENOUS
  Filled 2017-01-13: qty 10

## 2017-01-13 MED ORDER — PALONOSETRON HCL INJECTION 0.25 MG/5ML
INTRAVENOUS | Status: AC
  Filled 2017-01-13: qty 5

## 2017-01-13 MED ORDER — SODIUM CHLORIDE 0.9 % IV SOLN
Freq: Once | INTRAVENOUS | Status: AC
  Administered 2017-01-13: 10:00:00 via INTRAVENOUS
  Filled 2017-01-13: qty 5

## 2017-01-13 NOTE — Patient Instructions (Signed)
Implanted Port Insertion, Care After °This sheet gives you information about how to care for yourself after your procedure. Your health care provider may also give you more specific instructions. If you have problems or questions, contact your health care provider. °What can I expect after the procedure? °After your procedure, it is common to have: °· Discomfort at the port insertion site. °· Bruising on the skin over the port. This should improve over 3-4 days. ° °Follow these instructions at home: °Port care °· After your port is placed, you will get a manufacturer's information card. The card has information about your port. Keep this card with you at all times. °· Take care of the port as told by your health care provider. Ask your health care provider if you or a family member can get training for taking care of the port at home. A home health care nurse may also take care of the port. °· Make sure to remember what type of port you have. °Incision care °· Follow instructions from your health care provider about how to take care of your port insertion site. Make sure you: °? Wash your hands with soap and water before you change your bandage (dressing). If soap and water are not available, use hand sanitizer. °? Change your dressing as told by your health care provider. °? Leave stitches (sutures), skin glue, or adhesive strips in place. These skin closures may need to stay in place for 2 weeks or longer. If adhesive strip edges start to loosen and curl up, you may trim the loose edges. Do not remove adhesive strips completely unless your health care provider tells you to do that. °· Check your port insertion site every day for signs of infection. Check for: °? More redness, swelling, or pain. °? More fluid or blood. °? Warmth. °? Pus or a bad smell. °General instructions °· Do not take baths, swim, or use a hot tub until your health care provider approves. °· Do not lift anything that is heavier than 10 lb (4.5  kg) for a week, or as told by your health care provider. °· Ask your health care provider when it is okay to: °? Return to work or school. °? Resume usual physical activities or sports. °· Do not drive for 24 hours if you were given a medicine to help you relax (sedative). °· Take over-the-counter and prescription medicines only as told by your health care provider. °· Wear a medical alert bracelet in case of an emergency. This will tell any health care providers that you have a port. °· Keep all follow-up visits as told by your health care provider. This is important. °Contact a health care provider if: °· You cannot flush your port with saline as directed, or you cannot draw blood from the port. °· You have a fever or chills. °· You have more redness, swelling, or pain around your port insertion site. °· You have more fluid or blood coming from your port insertion site. °· Your port insertion site feels warm to the touch. °· You have pus or a bad smell coming from the port insertion site. °Get help right away if: °· You have chest pain or shortness of breath. °· You have bleeding from your port that you cannot control. °Summary °· Take care of the port as told by your health care provider. °· Change your dressing as told by your health care provider. °· Keep all follow-up visits as told by your health care provider. °  This information is not intended to replace advice given to you by your health care provider. Make sure you discuss any questions you have with your health care provider. °Document Released: 12/27/2012 Document Revised: 01/28/2016 Document Reviewed: 01/28/2016 °Elsevier Interactive Patient Education © 2017 [employer] ° °

## 2017-01-13 NOTE — Patient Instructions (Signed)
[place] [facility] Discharge Instructions for Patients Receiving Chemotherapy  Today you received the following chemotherapy agents Panitumumab, Irinotecan, Leucovorin, and 5FU  To help prevent nausea and vomiting after your treatment, we encourage you to take your nausea medication as directed   If you develop nausea and vomiting that is not controlled by your nausea medication, call the clinic.   BELOW ARE SYMPTOMS THAT SHOULD BE REPORTED IMMEDIATELY:  *FEVER GREATER THAN 100.5 F  *CHILLS WITH OR WITHOUT FEVER  NAUSEA AND VOMITING THAT IS NOT CONTROLLED WITH YOUR NAUSEA MEDICATION  *UNUSUAL SHORTNESS OF BREATH  *UNUSUAL BRUISING OR BLEEDING  TENDERNESS IN MOUTH AND THROAT WITH OR WITHOUT PRESENCE OF ULCERS  *URINARY PROBLEMS  *BOWEL PROBLEMS  UNUSUAL RASH Items with * indicate a potential emergency and should be followed up as soon as possible.  Feel free to call the clinic should you have any questions or concerns. The clinic phone number is [phone].  Please show the [facility] at check-in to the Emergency Department and triage nurse.

## 2017-01-13 NOTE — Progress Notes (Signed)
[facility] OFFICE PROGRESS NOTE   Diagnosis: Rectal cancer  INTERVAL HISTORY:   [facility] [name] completed another cycle of FOLFIRI/panitumumab 12/30/2016. He has diarrhea following chemotherapy. He is hesitant to take Imodium secondary to concern of developing constipation. He reports a cough for the past 2 days. He has intermittent right-sided back pain that last hours to days. No trauma. The rectal urgency is improved following the APR procedure. Dysuria has improved with myrbetriq.  He has a mild skin rash.  Objective:  Vital signs in last 24 hours:  Blood pressure 119/84, pulse 82, temperature 97.6 F (36.4 C), temperature source Oral, resp. rate 20, height '5\' 10"'  (1.778 m), weight 142 lb 1.1 oz (64.4 kg), SpO2 100 %.    HEENT: No thrush or ulcers Resp: Lungs clear bilaterally Cardio: Regular rate and rhythm GI: No hepatomegaly, left lower quadrant colostomy Vascular: No leg edema  Skin: Mild acne type rash over the face and trunk   Portacath/PICC-without erythema  Lab Results:  Lab Results  Component Value Date   WBC 5.9 12/30/2016   HGB 10.7 (L) 12/30/2016   HCT 33.1 (L) 12/30/2016   MCV 80.6 12/30/2016   PLT 151 12/30/2016   NEUTROABS 4.5 12/30/2016    CMP     Component Value Date/Time   NA 143 12/30/2016 0957   K 3.3 (L) 12/30/2016 0957   CL 103 08/20/2016 0451   CL 108 (H) 08/30/2012 0903   CO2 24 12/30/2016 0957   GLUCOSE 84 12/30/2016 0957   GLUCOSE 96 08/30/2012 0903   BUN 12.4 12/30/2016 0957   CREATININE 0.8 12/30/2016 0957   CALCIUM 8.6 12/30/2016 0957   PROT 6.0 (L) 12/30/2016 0957   ALBUMIN 3.4 (L) 12/30/2016 0957   AST 15 12/30/2016 0957   ALT 15 12/30/2016 0957   ALKPHOS 84 12/30/2016 0957   BILITOT <0.22 12/30/2016 0957   GFRNONAA >60 08/20/2016 0451   GFRAA >60 08/20/2016 0451    Lab Results  Component Value Date   CEA1 22.62 (H) 12/15/2016     Medications: I have reviewed the patient's current  medications.  Assessment/Plan: 1. Rectal cancer. Partially obstructing mass noted 1-2 cm from the anal verge on a colonoscopy 03/06/2012. Endoscopic ultrasound 03/14/2012 with a 7.5 mm thick, 3.2 cm wide hypoechoic, irregularly bordered mass that clearly passed into and through the muscularis propria layer of the distal rectal wall (uT3); 3 small (largest 7 mm) perirectal lymph nodes. The lymph nodes were all round, discrete, hypoechoic, homogenous; suspicious for malignant involvement (uN1).  No RAS mutation identified by [facility] 1 testing on the colon resection specimen 06/29/2012, APC alteration identified, microsatellite stable  He began radiation and concurrent Xeloda chemotherapy on 03/20/2012, completed 04/27/2012.   Low anterior resection/coloanal anastomosis and diverting ileostomy 06/29/2012 with the final pathology revealing a T2N0 tumor with extensive fibrosis and negative margins.   Cycle 1 of adjuvant CAPOX 07/19/2012. Cycle 5 of adjuvant CAPOX 10/11/2012.   CEA 2.5 on 11/14/2012.   CEA 14.6 03/08/2013.   Restaging CT evaluation 03/08/2013 with a 4 mm pulmonary nodule left lung base not identified on comparison exam; new liver lesions including a 29 x 26 mm irregular peripheral enhancing rounded lesion in the dome of the right hepatic lobe, a less well-defined new subcapsular lesion in the lateral right hepatic lobe measuring 12 mm, a new subcapsular lesion in the anterior right hepatic lobe adjacent to the gallbladder fossa measuring 10 mm; a rounded low-density lesion in the inferior right hepatic lobe  measuring 10 mm compared to 7 mm on the prior study (radiologist commented this may represent an enlarged cyst).   MRI of the abdomen 04/12/2013 confirmed multiple T2 hyperintense metastatic lesions throughout the right liver   Initiation of FOLFIRI/Avastin with genotype based irinotecan dosing per the [facility] study 04/05/2013.   Restaging CT evaluation on 06/20/2013  (after 2 cycles/4 treatments) showed improvement in the liver metastases and stable size of a left lower lobe pulmonary nodule now with central cavitation.   Continuation of FOLFIRI/Avastin.   Restaging CT evaluation 08/14/2013-decrease in the size of liver metastases, slight decrease in the size of a cavitary left lower lobe nodule, no evidence of disease progression.   Status post right hepatic lobectomy 09/17/2013. Pathology showed multiple foci of metastatic adenocarcinoma (5 nodules of metastatic adenocarcinoma 4 of which are subcapsular with the nodules ranging in size from 0.6-1.8 cm in greatest dimension). Margins not involved. Biopsy of a portal lymph node showed benign adipose tissue; no lymph node tissue or malignancy.   Normal CEA 11/06/2013   CT 11/06/2013 with a new pleural-based right lower chest lesion, slight in enlargement of the a left-sided lung nodule  CT 01/29/2014 with a decrease in the right lower chest pleural-based lesion and a slight increase of a left-sided lung lesion, other lung lesions were stable  CT chest 05/02/2014 with a decrease in the size of a right lower lobe pulmonary nodule months similar size of a dominant left lower lobe nodule, minimal enlargement of a smaller left lower lobe nodule, no new site of disease  CT chest 11/07/2014 with a slight increase in several left-sided lung nodules  CT chest 02/10/2015 with new small left hilar lymph nodes, a possible new left lower lobe nodule, and stability of other lung nodules  PET scan 03/12/2015 with hypermetabolic left hilar nodes, hypermetabolic left lower lobe nodules, hyper metabolic retroperitoneal nodes, intense hypermetabolism at the coloanal anastomosis, and hypermetabolic thickening in the presacral space  Cycle 1 FOLFIRI/PANITUMUMAB 05/21/2015  Cycle 2 FOLFIRI/PANITUMUMAB 06/05/2015  Cycle 3 FOLFIRI/PANITUMUMAB 06/19/2015  Cycle 4 FOLFIRI 07/10/2015  Cycle 5 FOLFIRI  07/24/2015  Restaging CTs 08/06/2015-resolution of hilar/retroperitoneal adenopathy, improvement in the hypermetabolic lung nodule, other lung nodules are stable, no new lesions  Cycle 6 FOLFIRI with PANITUMUMAB 08/28/2015 -panitumumab dose reduced  Cycle 7 FOLFIRI 09/11/2015-no panitumumab given  Cycle 8 FOLFIRI with PANITUMUMAB 09/25/2015-PANITUMUMAB dose reduced  Cycle 9 FOLFIRI 10/09/2015-no PANITUMUMAB given  Cycle 10 FOLFIRI with panitumumab 10/23/2015  CTs 11/06/2015-possible slight enlargement of a left lower lobe nodule, no other evidence of disease progression.  CTs 02/16/2016-enlargement of left-sided pulmonary nodules and mediastinal/left hilar nodes, improved splenomegaly  CTs 06/21/2016-increased size of mediastinal/left hilar nodes, increased left lung nodules, and increased soft tissue at the porta hepatis  CTs 10/28/2016-progressive disease in the chest and abdomen with an enlarging left hilar mass and slight interval enlargement of pulmonary lesions. Right upper quadrant necrotic nodal mass significantly increased in size with mass effect on the liver and possible invasion. Significant compression of the intrahepatic IVC. New right hepatic lobe lesion. Moderate pelvic ascites.  Cycle 1 FOLFIRI/PANITUMUMAB 11/04/2016  Cycle 2 FOLFIRI/PANITUMUMAB 11/18/2016  Cycle 3 FOLFIRI/panitumumab 12/02/2016  Cycle 4 FOLFIRI/PANITUMUMAB 12/15/2016  Cycle 5 FOLFIRI/PANITUMUMAB 12/30/2016  CTs 01/06/2017-no evidence of progressive disease, decreased chest adenopathy, stable left lower lobe nodules, decreased liver metastasis, decreased right retroperitoneal mass  Cycle 6 FOLFIRI/panitumumab 01/13/2017 2. History of Irregular bowel habits/rectal bleeding secondary to #1. 3. History of Mild elevation of the liver enzymes. Question secondary  to hepatic steatosis. 4. Indeterminate 8 mm posterior right liver lesion on the staging CT 03/06/2012. 5. Mildly elevated CEA at 5.7 on  03/06/2012. 6. History of radiation erythema at the groin and perineum 7. 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 No clinical evidence of thrombophlebitis or extravasation. 8. Delayed nausea following chemotherapy-Decadron prophylaxis was added with cycle 3 CAPOX-improved. 9. Ileostomy takedown 12/07/2012. 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 11. Port-A-Cath placement 12/31/204 12. Severe neutropenia secondary to chemotherapy following cycle 1 of FOLFIRI, chemotherapy was dose reduced and he received Neulasta with day 15 cycle 1  13. Nausea and vomiting following cycle 1 of FOLFIRI 14. Rectal stricture-manual/colonoscopic dilatation by Dr. Ardis Hughs 03/01/2016  APR 08/17/2016-no evidence of malignancy 15. History of Leukopenia/Thrombocytopenia-persistent, potentially a sequelae of chemotherapy or hepatic toxicity from chemotherapy/radiation. Bone marrow biopsy 11/20/2014 showed cellular bone marrow with trilineage hematopoiesis. Significant dyspoiesis was not present and there was no evidence of metastatic carcinoma. Cytogenetic analysis showed the presence of normal male chromosomes with no observable clonal chromosomal abnormalities.  probable cirrhosis 16. Genetic testing-negative genetic panel in March 2014 17. 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 Severe over the face-steroid Dosepak prescribed 07/03/2015. Improved 07/10/2015. Further improved 07/24/2015, 08/07/2015, 08/28/2015 18. Diarrhea secondary to chemotherapy-encouraged to use Imodium  19. History of Paronychia secondary to PANITUMUMAB 20. 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 The CEA is lower and there is clinical evidence of a response to therapy. The restaging CTs confirmed a partial response to the FOLFIRI/panitumumab. I reviewed the  CT images with Jacob [name]. The plan is to continue FOLFIRI/panitumumab.  The etiology of the right back pain is unclear. He reports relief when he takes an occasional oxycodone. The pain could be related to the right retroperitoneal mass. He will contact us for increased pain.  25 minutes were spent with the patient today. The majority of the time was used for counseling and coordination of care.  Donneta Romberg, MD  01/13/2017  8:49 AM

## 2017-01-14 ENCOUNTER — Telehealth: Payer: Self-pay | Admitting: Oncology

## 2017-01-14 NOTE — Telephone Encounter (Signed)
Scheduled appt per 10/25 los - patient to get an updated schedule next visit.

## 2017-01-15 ENCOUNTER — Ambulatory Visit (HOSPITAL_BASED_OUTPATIENT_CLINIC_OR_DEPARTMENT_OTHER): Payer: BC Managed Care – PPO

## 2017-01-15 VITALS — BP 116/80 | HR 84 | Temp 98.0°F

## 2017-01-15 DIAGNOSIS — C787 Secondary malignant neoplasm of liver and intrahepatic bile duct: Principal | ICD-10-CM

## 2017-01-15 DIAGNOSIS — C20 Malignant neoplasm of rectum: Secondary | ICD-10-CM

## 2017-01-15 DIAGNOSIS — Z5189 Encounter for other specified aftercare: Secondary | ICD-10-CM | POA: Diagnosis not present

## 2017-01-15 MED ORDER — HEPARIN SOD (PORK) LOCK FLUSH 100 UNIT/ML IV SOLN
500.0000 [IU] | Freq: Once | INTRAVENOUS | Status: AC | PRN
  Administered 2017-01-15: 500 [IU]
  Filled 2017-01-15: qty 5

## 2017-01-15 MED ORDER — PEGFILGRASTIM INJECTION 6 MG/0.6ML ~~LOC~~
6.0000 mg | PREFILLED_SYRINGE | Freq: Once | SUBCUTANEOUS | Status: AC
  Administered 2017-01-15: 6 mg via SUBCUTANEOUS

## 2017-01-15 MED ORDER — SODIUM CHLORIDE 0.9 % IJ SOLN
10.0000 mL | INTRAMUSCULAR | Status: DC | PRN
  Administered 2017-01-15: 10 mL
  Filled 2017-01-15: qty 10

## 2017-01-19 ENCOUNTER — Telehealth: Payer: Self-pay | Admitting: *Deleted

## 2017-01-19 ENCOUNTER — Encounter: Payer: Self-pay | Admitting: Nurse Practitioner

## 2017-01-19 NOTE — Telephone Encounter (Signed)
Pt stopped by office this AM to request a return to work letter. Left message informing him letter was faxed attn: Gretel Acre [phone].

## 2017-01-23 ENCOUNTER — Other Ambulatory Visit: Payer: Self-pay | Admitting: Oncology

## 2017-01-27 ENCOUNTER — Encounter: Payer: Self-pay | Admitting: Nurse Practitioner

## 2017-01-27 ENCOUNTER — Telehealth: Payer: Self-pay

## 2017-01-27 ENCOUNTER — Telehealth: Payer: Self-pay | Admitting: Emergency Medicine

## 2017-01-27 ENCOUNTER — Ambulatory Visit (HOSPITAL_BASED_OUTPATIENT_CLINIC_OR_DEPARTMENT_OTHER): Payer: BC Managed Care – PPO

## 2017-01-27 ENCOUNTER — Ambulatory Visit (HOSPITAL_BASED_OUTPATIENT_CLINIC_OR_DEPARTMENT_OTHER): Payer: BC Managed Care – PPO | Admitting: Nurse Practitioner

## 2017-01-27 ENCOUNTER — Ambulatory Visit: Payer: BC Managed Care – PPO

## 2017-01-27 ENCOUNTER — Other Ambulatory Visit (HOSPITAL_BASED_OUTPATIENT_CLINIC_OR_DEPARTMENT_OTHER): Payer: BC Managed Care – PPO

## 2017-01-27 VITALS — BP 114/61 | HR 72 | Temp 98.1°F | Resp 18 | Ht 70.0 in | Wt 142.8 lb

## 2017-01-27 DIAGNOSIS — C20 Malignant neoplasm of rectum: Secondary | ICD-10-CM | POA: Diagnosis not present

## 2017-01-27 DIAGNOSIS — Z5111 Encounter for antineoplastic chemotherapy: Secondary | ICD-10-CM | POA: Diagnosis not present

## 2017-01-27 DIAGNOSIS — R197 Diarrhea, unspecified: Secondary | ICD-10-CM

## 2017-01-27 DIAGNOSIS — C787 Secondary malignant neoplasm of liver and intrahepatic bile duct: Secondary | ICD-10-CM

## 2017-01-27 DIAGNOSIS — Z95828 Presence of other vascular implants and grafts: Secondary | ICD-10-CM

## 2017-01-27 DIAGNOSIS — Z5112 Encounter for antineoplastic immunotherapy: Secondary | ICD-10-CM | POA: Diagnosis not present

## 2017-01-27 LAB — CBC WITH DIFFERENTIAL/PLATELET
BASO%: 0.9 % (ref 0.0–2.0)
Basophils Absolute: 0 10*3/uL (ref 0.0–0.1)
EOS%: 5.2 % (ref 0.0–7.0)
Eosinophils Absolute: 0.2 10*3/uL (ref 0.0–0.5)
HCT: 32.8 % — ABNORMAL LOW (ref 38.4–49.9)
HGB: 10.7 g/dL — ABNORMAL LOW (ref 13.0–17.1)
LYMPH#: 0.5 10*3/uL — AB (ref 0.9–3.3)
LYMPH%: 14.3 % (ref 14.0–49.0)
MCH: 27.3 pg (ref 27.2–33.4)
MCHC: 32.6 g/dL (ref 32.0–36.0)
MCV: 83.9 fL (ref 79.3–98.0)
MONO#: 0.4 10*3/uL (ref 0.1–0.9)
MONO%: 10.6 % (ref 0.0–14.0)
NEUT%: 69 % (ref 39.0–75.0)
NEUTROS ABS: 2.6 10*3/uL (ref 1.5–6.5)
PLATELETS: 133 10*3/uL — AB (ref 140–400)
RBC: 3.91 10*6/uL — ABNORMAL LOW (ref 4.20–5.82)
RDW: 24.4 % — ABNORMAL HIGH (ref 11.0–14.6)
WBC: 3.7 10*3/uL — AB (ref 4.0–10.3)

## 2017-01-27 LAB — MAGNESIUM: Magnesium: 2.2 mg/dl (ref 1.5–2.5)

## 2017-01-27 LAB — COMPREHENSIVE METABOLIC PANEL
ALBUMIN: 3.6 g/dL (ref 3.5–5.0)
ALK PHOS: 94 U/L (ref 40–150)
ALT: 20 U/L (ref 0–55)
AST: 17 U/L (ref 5–34)
Anion Gap: 11 mEq/L (ref 3–11)
BUN: 12.4 mg/dL (ref 7.0–26.0)
CO2: 20 mEq/L — ABNORMAL LOW (ref 22–29)
Calcium: 8.6 mg/dL (ref 8.4–10.4)
Chloride: 112 mEq/L — ABNORMAL HIGH (ref 98–109)
Creatinine: 0.8 mg/dL (ref 0.7–1.3)
GLUCOSE: 95 mg/dL (ref 70–140)
POTASSIUM: 3 meq/L — AB (ref 3.5–5.1)
SODIUM: 143 meq/L (ref 136–145)
Total Bilirubin: <0.22 mg/dL (ref 0.20–1.20)
Total Protein: 6.2 g/dL — ABNORMAL LOW (ref 6.4–8.3)

## 2017-01-27 LAB — CEA (IN HOUSE-CHCC): CEA (CHCC-In House): 22.09 ng/mL — ABNORMAL HIGH (ref 0.00–5.00)

## 2017-01-27 MED ORDER — SODIUM CHLORIDE 0.9 % IV SOLN
3.0000 mg/kg | Freq: Once | INTRAVENOUS | Status: AC
  Administered 2017-01-27: 200 mg via INTRAVENOUS
  Filled 2017-01-27: qty 10

## 2017-01-27 MED ORDER — SODIUM CHLORIDE 0.9 % IJ SOLN
10.0000 mL | INTRAMUSCULAR | Status: DC | PRN
  Administered 2017-01-27: 10 mL via INTRAVENOUS
  Filled 2017-01-27: qty 10

## 2017-01-27 MED ORDER — PALONOSETRON HCL INJECTION 0.25 MG/5ML
INTRAVENOUS | Status: AC
  Filled 2017-01-27: qty 5

## 2017-01-27 MED ORDER — ATROPINE SULFATE 1 MG/ML IJ SOLN: 0.5000 mg | Freq: Once | INTRAMUSCULAR | Status: DC | PRN

## 2017-01-27 MED ORDER — SODIUM CHLORIDE 0.9 % IV SOLN
Freq: Once | INTRAVENOUS | Status: AC
  Administered 2017-01-27: 12:00:00 via INTRAVENOUS
  Filled 2017-01-27: qty 5

## 2017-01-27 MED ORDER — SODIUM CHLORIDE 0.9 % IV SOLN
Freq: Once | INTRAVENOUS | Status: AC
  Administered 2017-01-27: 12:00:00 via INTRAVENOUS

## 2017-01-27 MED ORDER — SODIUM CHLORIDE 0.9 % IV SOLN
2400.0000 mg/m2 | INTRAVENOUS | Status: AC
  Administered 2017-01-27: 4300 mg via INTRAVENOUS
  Filled 2017-01-27: qty 86

## 2017-01-27 MED ORDER — IRINOTECAN HCL CHEMO INJECTION 100 MG/5ML
135.0000 mg/m2 | Freq: Once | INTRAVENOUS | Status: AC
  Administered 2017-01-27: 240 mg via INTRAVENOUS
  Filled 2017-01-27: qty 12

## 2017-01-27 MED ORDER — LEUCOVORIN CALCIUM INJECTION 350 MG
400.0000 mg/m2 | Freq: Once | INTRAVENOUS | Status: AC
  Administered 2017-01-27: 716 mg via INTRAVENOUS
  Filled 2017-01-27: qty 35.8

## 2017-01-27 MED ORDER — PALONOSETRON HCL INJECTION 0.25 MG/5ML
0.2500 mg | Freq: Once | INTRAVENOUS | Status: AC
  Administered 2017-01-27: 0.25 mg via INTRAVENOUS

## 2017-01-27 NOTE — Telephone Encounter (Signed)
Printed avs and calender for upcoming appointment. Per 11/8 los

## 2017-01-27 NOTE — Telephone Encounter (Addendum)
Called pt and left VM to call back regarding this note.   ----- Message from Owens Shark, NP sent at 01/27/2017  2:18 PM EST ----- Please instruct him to begin K dur 20 meq daily.

## 2017-01-27 NOTE — Patient Instructions (Signed)
[facility] Discharge Instructions for Patients Receiving Chemotherapy  Today you received the following chemotherapy agents Panitumumab, Irinotecan, Leucovorin, and 5FU  To help prevent nausea and vomiting after your treatment, we encourage you to take your nausea medication as directed   If you develop nausea and vomiting that is not controlled by your nausea medication, call the clinic.   BELOW ARE SYMPTOMS THAT SHOULD BE REPORTED IMMEDIATELY:  *FEVER GREATER THAN 100.5 F  *CHILLS WITH OR WITHOUT FEVER  NAUSEA AND VOMITING THAT IS NOT CONTROLLED WITH YOUR NAUSEA MEDICATION  *UNUSUAL SHORTNESS OF BREATH  *UNUSUAL BRUISING OR BLEEDING  TENDERNESS IN MOUTH AND THROAT WITH OR WITHOUT PRESENCE OF ULCERS  *URINARY PROBLEMS  *BOWEL PROBLEMS  UNUSUAL RASH Items with * indicate a potential emergency and should be followed up as soon as possible.  Feel free to call the clinic should you have any questions or concerns. The clinic phone number is [phone].  Please show the [place] at check-in to the Emergency Department and triage nurse.

## 2017-01-27 NOTE — Progress Notes (Signed)
[place] [facility] OFFICE PROGRESS NOTE   Diagnosis: Rectal cancer  INTERVAL HISTORY:   Jacob [name] returns as scheduled.  He completed cycle 6 FOLFIRI/Panitumumab 01/13/2017.  He denies nausea/vomiting.  No mouth sores.  He does note that his mouth becomes irritated.  He has periodic diarrhea.  He takes Imodium as needed.  Dysuria continues to be improved.  Rash varies.  He continues minocycline.  He has a good appetite.  He continues to have hoarseness but thinks this is somewhat improved.  Objective:  Vital signs in last 24 hours:  Blood pressure 114/61, pulse 72, temperature 98.1 F (36.7 C), temperature source Oral, resp. rate 18, height '5\' 10"'  (1.778 m), weight 142 lb 12.8 oz (64.8 kg), SpO2 100 %.    HEENT: No thrush or ulcers. Resp: Lungs clear bilaterally. Cardio: Regular rate and rhythm. GI: Abdomen soft and nontender.  No hepatomegaly.  Left lower quadrant colostomy. Vascular: No leg edema.  Skin: Acne type rash over the face and trunk. Portacath without erythema.   Lab Results:  Lab Results  Component Value Date   WBC 3.7 (L) 01/27/2017   HGB 10.7 (L) 01/27/2017   HCT 32.8 (L) 01/27/2017   MCV 83.9 01/27/2017   PLT 133 (L) 01/27/2017   NEUTROABS 2.6 01/27/2017    Imaging:  No results found.  Medications: I have reviewed the patient's current medications.  Assessment/Plan: 1. Rectal cancer. Partially obstructing mass noted 1-2 cm from the anal verge on a colonoscopy 03/06/2012. Endoscopic ultrasound 03/14/2012 with a 7.5 mm thick, 3.2 cm wide hypoechoic, irregularly bordered mass that clearly passed into and through the muscularis propria layer of the distal rectal wall (uT3); 3 small (largest 7 mm) perirectal lymph nodes. The lymph nodes were all round, discrete, hypoechoic, homogenous; suspicious for malignant involvement (uN1).  No RAS mutation identified by [facility] 1 testing on the colon resection specimen 06/29/2012, APC alteration  identified, microsatellite stable  He began radiation and concurrent Xeloda chemotherapy on 03/20/2012, completed 04/27/2012.   Low anterior resection/coloanal anastomosis and diverting ileostomy 06/29/2012 with the final pathology revealing a T2N0 tumor with extensive fibrosis and negative margins.   Cycle 1 of adjuvant CAPOX 07/19/2012. Cycle 5 of adjuvant CAPOX 10/11/2012.   CEA 2.5 on 11/14/2012.   CEA 14.6 03/08/2013.   Restaging CT evaluation 03/08/2013 with a 4 mm pulmonary nodule left lung base not identified on comparison exam; new liver lesions including a 29 x 26 mm irregular peripheral enhancing rounded lesion in the dome of the right hepatic lobe, a less well-defined new subcapsular lesion in the lateral right hepatic lobe measuring 12 mm, a new subcapsular lesion in the anterior right hepatic lobe adjacent to the gallbladder fossa measuring 10 mm; a rounded low-density lesion in the inferior right hepatic lobe measuring 10 mm compared to 7 mm on the prior study (radiologist commented this may represent an enlarged cyst).   MRI of the abdomen 04/12/2013 confirmed multiple T2 hyperintense metastatic lesions throughout the right liver   Initiation of FOLFIRI/Avastin with genotype based irinotecan dosing per the [facility] study 04/05/2013.   Restaging CT evaluation on 06/20/2013 (after 2 cycles/4 treatments) showed improvement in the liver metastases and stable size of a left lower lobe pulmonary nodule now with central cavitation.   Continuation of FOLFIRI/Avastin.   Restaging CT evaluation 08/14/2013-decrease in the size of liver metastases, slight decrease in the size of a cavitary left lower lobe nodule, no evidence of disease progression.   Status post right hepatic lobectomy  09/17/2013. Pathology showed multiple foci of metastatic adenocarcinoma (5 nodules of metastatic adenocarcinoma 4 of which are subcapsular with the nodules ranging in size from 0.6-1.8 cm in greatest  dimension). Margins not involved. Biopsy of a portal lymph node showed benign adipose tissue; no lymph node tissue or malignancy.   Normal CEA 11/06/2013   CT 11/06/2013 with a new pleural-based right lower chest lesion, slight in enlargement of the a left-sided lung nodule  CT 01/29/2014 with a decrease in the right lower chest pleural-based lesion and a slight increase of a left-sided lung lesion, other lung lesions were stable  CT chest 05/02/2014 with a decrease in the size of a right lower lobe pulmonary nodule months similar size of a dominant left lower lobe nodule, minimal enlargement of a smaller left lower lobe nodule, no new site of disease  CT chest 11/07/2014 with a slight increase in several left-sided lung nodules  CT chest 02/10/2015 with new small left hilar lymph nodes, a possible new left lower lobe nodule, and stability of other lung nodules  PET scan 03/12/2015 with hypermetabolic left hilar nodes, hypermetabolic left lower lobe nodules, hyper metabolic retroperitoneal nodes, intense hypermetabolism at the coloanal anastomosis, and hypermetabolic thickening in the presacral space  Cycle 1 FOLFIRI/PANITUMUMAB 05/21/2015  Cycle 2 FOLFIRI/PANITUMUMAB 06/05/2015  Cycle 3 FOLFIRI/PANITUMUMAB 06/19/2015  Cycle 4 FOLFIRI 07/10/2015  Cycle 5 FOLFIRI 07/24/2015  Restaging CTs 08/06/2015-resolution of hilar/retroperitoneal adenopathy, improvement in the hypermetabolic lung nodule, other lung nodules are stable, no new lesions  Cycle 6 FOLFIRI with PANITUMUMAB 08/28/2015 -panitumumab dose reduced  Cycle 7 FOLFIRI 09/11/2015-no panitumumab given  Cycle 8 FOLFIRI with PANITUMUMAB 09/25/2015-PANITUMUMAB dose reduced  Cycle 9 FOLFIRI 10/09/2015-no PANITUMUMAB given  Cycle 10 FOLFIRI with panitumumab 10/23/2015  CTs 11/06/2015-possible slight enlargement of a left lower lobe nodule, no other evidence of disease progression.  CTs 02/16/2016-enlargement of left-sided  pulmonary nodules and mediastinal/left hilar nodes, improved splenomegaly  CTs 06/21/2016-increased size of mediastinal/left hilar nodes, increased left lung nodules, and increased soft tissue at the porta hepatis  CTs 10/28/2016-progressive disease in the chest and abdomen with an enlarging left hilar mass and slight interval enlargement of pulmonary lesions. Right upper quadrant necrotic nodal mass significantly increased in size with mass effect on the liver and possible invasion. Significant compression of the intrahepatic IVC. New right hepatic lobe lesion. Moderate pelvic ascites.  Cycle 1 FOLFIRI/PANITUMUMAB 11/04/2016  Cycle 2 FOLFIRI/PANITUMUMAB 11/18/2016  Cycle 3 FOLFIRI/panitumumab 12/02/2016  Cycle4 FOLFIRI/PANITUMUMAB 12/15/2016  Cycle 5 FOLFIRI/PANITUMUMAB 12/30/2016  CTs 01/06/2017-no evidence of progressive disease, decreased chest adenopathy, stable left lower lobe nodules, decreased liver metastasis, decreased right retroperitoneal mass  Cycle 6 FOLFIRI/panitumumab 01/13/2017  Cycle 7 FOLFIRI/Panitumumab 01/27/2017 2. History of Irregular bowel habits/rectal bleeding secondary to #1. 3. History of Mild elevation of the liver enzymes. Question secondary to hepatic steatosis. 4. Indeterminate 8 mm posterior right liver lesion on the staging CT 03/06/2012. 5. Mildly elevated CEA at 5.7 on 03/06/2012. 6. History of radiation erythema at the groin and perineum 7. 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 No clinical evidence of thrombophlebitis or extravasation. 8. Delayed nausea following chemotherapy-Decadron prophylaxis was added with cycle 3 CAPOX-improved. 9. Ileostomy takedown 12/07/2012. 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 11. Port-A-Cath placement 12/31/204 12. Severe neutropenia secondary to chemotherapy following cycle 1 of FOLFIRI, chemotherapy was dose reduced and he  received Neulasta with day 15 cycle 1  13. Nausea and vomiting following cycle 1 of FOLFIRI 14. Rectal  stricture-manual/colonoscopic dilatation by Dr. Ardis Hughs 03/01/2016  APR 08/17/2016-no evidence of malignancy 15. History of Leukopenia/Thrombocytopenia-persistent, potentially a sequelae of chemotherapy or hepatic toxicity from chemotherapy/radiation. Bone marrow biopsy 11/20/2014 showed cellular bone marrow with trilineage hematopoiesis. Significant dyspoiesis was not present and there was no evidence of metastatic carcinoma. Cytogenetic analysis showed the presence of normal male chromosomes with no observable clonal chromosomal abnormalities.  probable cirrhosis 16. Genetic testing-negative genetic panel in March 2014 17. 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 Severe over the face-steroid Dosepak prescribed 07/03/2015. Improved 07/10/2015. Further improved 07/24/2015, 08/07/2015, 08/28/2015 18. Diarrhea secondary to chemotherapy-encouraged to use Imodium  19. History of Paronychia secondary to PANITUMUMAB 20. 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 appears stable.  He has completed 6 cycles of FOLFIRI/Panitumumab.  Plan to proceed with cycle 7 today as scheduled.  He will return for a follow-up visit and cycle 8 in 3 weeks.  He will contact the office in the interim with any problems.    Ned Card ANP/GNP-BC   01/27/2017  10:57 AM

## 2017-01-28 ENCOUNTER — Other Ambulatory Visit: Payer: Self-pay | Admitting: Medical Oncology

## 2017-01-29 ENCOUNTER — Ambulatory Visit (HOSPITAL_BASED_OUTPATIENT_CLINIC_OR_DEPARTMENT_OTHER): Payer: BC Managed Care – PPO

## 2017-01-29 VITALS — BP 114/74 | HR 96 | Temp 97.9°F | Resp 18

## 2017-01-29 DIAGNOSIS — C787 Secondary malignant neoplasm of liver and intrahepatic bile duct: Principal | ICD-10-CM

## 2017-01-29 DIAGNOSIS — C20 Malignant neoplasm of rectum: Secondary | ICD-10-CM

## 2017-01-29 DIAGNOSIS — Z5189 Encounter for other specified aftercare: Secondary | ICD-10-CM | POA: Diagnosis not present

## 2017-01-29 MED ORDER — PEGFILGRASTIM INJECTION 6 MG/0.6ML ~~LOC~~
6.0000 mg | PREFILLED_SYRINGE | Freq: Once | SUBCUTANEOUS | Status: AC
  Administered 2017-01-29: 6 mg via SUBCUTANEOUS

## 2017-01-29 MED ORDER — SODIUM CHLORIDE 0.9 % IJ SOLN
10.0000 mL | INTRAMUSCULAR | Status: DC | PRN
  Administered 2017-01-29: 10 mL
  Filled 2017-01-29: qty 10

## 2017-01-29 MED ORDER — HEPARIN SOD (PORK) LOCK FLUSH 100 UNIT/ML IV SOLN
500.0000 [IU] | Freq: Once | INTRAVENOUS | Status: AC | PRN
  Administered 2017-01-29: 500 [IU]
  Filled 2017-01-29: qty 5

## 2017-02-13 ENCOUNTER — Other Ambulatory Visit: Payer: Self-pay | Admitting: Oncology

## 2017-02-17 ENCOUNTER — Other Ambulatory Visit (HOSPITAL_BASED_OUTPATIENT_CLINIC_OR_DEPARTMENT_OTHER): Payer: BC Managed Care – PPO

## 2017-02-17 ENCOUNTER — Ambulatory Visit (HOSPITAL_BASED_OUTPATIENT_CLINIC_OR_DEPARTMENT_OTHER): Payer: BC Managed Care – PPO | Admitting: Oncology

## 2017-02-17 ENCOUNTER — Ambulatory Visit (HOSPITAL_BASED_OUTPATIENT_CLINIC_OR_DEPARTMENT_OTHER): Payer: BC Managed Care – PPO

## 2017-02-17 ENCOUNTER — Ambulatory Visit: Payer: BC Managed Care – PPO

## 2017-02-17 VITALS — BP 135/84 | HR 69 | Temp 98.0°F | Resp 18 | Ht 70.0 in | Wt 141.9 lb

## 2017-02-17 DIAGNOSIS — C787 Secondary malignant neoplasm of liver and intrahepatic bile duct: Secondary | ICD-10-CM

## 2017-02-17 DIAGNOSIS — R6884 Jaw pain: Secondary | ICD-10-CM | POA: Diagnosis not present

## 2017-02-17 DIAGNOSIS — Z5112 Encounter for antineoplastic immunotherapy: Secondary | ICD-10-CM | POA: Diagnosis not present

## 2017-02-17 DIAGNOSIS — C20 Malignant neoplasm of rectum: Secondary | ICD-10-CM

## 2017-02-17 DIAGNOSIS — Z95828 Presence of other vascular implants and grafts: Secondary | ICD-10-CM

## 2017-02-17 DIAGNOSIS — M549 Dorsalgia, unspecified: Secondary | ICD-10-CM | POA: Diagnosis not present

## 2017-02-17 LAB — CBC WITH DIFFERENTIAL/PLATELET
BASO%: 0.8 % (ref 0.0–2.0)
Basophils Absolute: 0 10*3/uL (ref 0.0–0.1)
EOS ABS: 0.2 10*3/uL (ref 0.0–0.5)
EOS%: 4.6 % (ref 0.0–7.0)
HCT: 32.7 % — ABNORMAL LOW (ref 38.4–49.9)
HGB: 10.5 g/dL — ABNORMAL LOW (ref 13.0–17.1)
LYMPH%: 11.1 % — AB (ref 14.0–49.0)
MCH: 27.6 pg (ref 27.2–33.4)
MCHC: 32.2 g/dL (ref 32.0–36.0)
MCV: 85.5 fL (ref 79.3–98.0)
MONO#: 0.5 10*3/uL (ref 0.1–0.9)
MONO%: 12.3 % (ref 0.0–14.0)
NEUT%: 71.2 % (ref 39.0–75.0)
NEUTROS ABS: 2.7 10*3/uL (ref 1.5–6.5)
Platelets: 135 10*3/uL — ABNORMAL LOW (ref 140–400)
RBC: 3.82 10*6/uL — AB (ref 4.20–5.82)
RDW: 22.6 % — ABNORMAL HIGH (ref 11.0–14.6)
WBC: 3.8 10*3/uL — AB (ref 4.0–10.3)
lymph#: 0.4 10*3/uL — ABNORMAL LOW (ref 0.9–3.3)

## 2017-02-17 LAB — COMPREHENSIVE METABOLIC PANEL
ALBUMIN: 3.6 g/dL (ref 3.5–5.0)
ALT: 9 U/L (ref 0–55)
AST: 16 U/L (ref 5–34)
Alkaline Phosphatase: 72 U/L (ref 40–150)
Anion Gap: 7 mEq/L (ref 3–11)
BUN: 13.2 mg/dL (ref 7.0–26.0)
CALCIUM: 8.7 mg/dL (ref 8.4–10.4)
CHLORIDE: 106 meq/L (ref 98–109)
CO2: 26 mEq/L (ref 22–29)
CREATININE: 0.8 mg/dL (ref 0.7–1.3)
EGFR: >60 mL/min/{1.73_m2} (ref >60)
Glucose: 102 mg/dl (ref 70–140)
Potassium: 3.6 mEq/L (ref 3.5–5.1)
Sodium: 139 mEq/L (ref 136–145)
Total Bilirubin: 0.44 mg/dL (ref 0.20–1.20)
Total Protein: 6.1 g/dL — ABNORMAL LOW (ref 6.4–8.3)

## 2017-02-17 LAB — MAGNESIUM: MAGNESIUM: 2.5 mg/dL (ref 1.5–2.5)

## 2017-02-17 MED ORDER — SODIUM CHLORIDE 0.9 % IV SOLN
Freq: Once | INTRAVENOUS | Status: AC
  Administered 2017-02-17: 10:00:00 via INTRAVENOUS
  Filled 2017-02-17: qty 5

## 2017-02-17 MED ORDER — SODIUM CHLORIDE 0.9 % IJ SOLN
10.0000 mL | INTRAMUSCULAR | Status: DC | PRN
  Administered 2017-02-17: 10 mL via INTRAVENOUS
  Filled 2017-02-17: qty 10

## 2017-02-17 MED ORDER — SODIUM CHLORIDE 0.9 % IV SOLN
3.0000 mg/kg | Freq: Once | INTRAVENOUS | Status: AC
  Administered 2017-02-17: 200 mg via INTRAVENOUS
  Filled 2017-02-17: qty 10

## 2017-02-17 MED ORDER — PALONOSETRON HCL INJECTION 0.25 MG/5ML
INTRAVENOUS | Status: AC
  Filled 2017-02-17: qty 5

## 2017-02-17 MED ORDER — SODIUM CHLORIDE 0.9 % IV SOLN
2400.0000 mg/m2 | INTRAVENOUS | Status: DC
  Administered 2017-02-17: 4300 mg via INTRAVENOUS
  Filled 2017-02-17: qty 86

## 2017-02-17 MED ORDER — PALONOSETRON HCL INJECTION 0.25 MG/5ML
0.2500 mg | Freq: Once | INTRAVENOUS | Status: AC
  Administered 2017-02-17: 0.25 mg via INTRAVENOUS

## 2017-02-17 MED ORDER — SODIUM CHLORIDE 0.9 % IV SOLN
Freq: Once | INTRAVENOUS | Status: AC
  Administered 2017-02-17: 10:00:00 via INTRAVENOUS

## 2017-02-17 MED ORDER — IRINOTECAN HCL CHEMO INJECTION 100 MG/5ML
135.0000 mg/m2 | Freq: Once | INTRAVENOUS | Status: AC
  Administered 2017-02-17: 240 mg via INTRAVENOUS
  Filled 2017-02-17: qty 10

## 2017-02-17 MED ORDER — OXYCODONE HCL 5 MG PO TABS: 5.0000 mg | ORAL_TABLET | Freq: Every day | ORAL | 0 refills | Status: DC

## 2017-02-17 MED ORDER — ATROPINE SULFATE 1 MG/ML IJ SOLN: 0.5000 mg | Freq: Once | INTRAMUSCULAR | Status: DC | PRN

## 2017-02-17 MED ORDER — LEUCOVORIN CALCIUM INJECTION 350 MG
400.0000 mg/m2 | Freq: Once | INTRAVENOUS | Status: AC
  Administered 2017-02-17: 716 mg via INTRAVENOUS
  Filled 2017-02-17: qty 35.8

## 2017-02-17 NOTE — Progress Notes (Addendum)
[name] OFFICE PROGRESS NOTE   Diagnosis: Rectal cancer  INTERVAL HISTORY:   Jacob [name] returns as scheduled.  He completed another cycle of FOLFIRI/panitumumab on 01/27/2017.  No change in the skin rash.  He has noted improvement in hoarseness.  He has been constipated for the past few days. He complains of pain at the mid back.  The pain has been present since he underwent a liver resection surgery in 2015.  He painted a room in his house after Thanksgiving and noted a marked increase in pain.  He was in bed for 1 day and required oxycodone for relief of pain.  He also reports pain at the left parotid region.  Both the back and parotid pain are worse with lying down.  Objective:  Vital signs in last 24 hours:  There were no vitals taken for this visit.    HEENT: No thrush or ulcers.  Slight fullness at the left compared to the right parotid.  No tenderness or erythema. Resp: Lungs clear bilaterally Cardio: Regular rate and rhythm GI: No hepatomegaly, nontender, left lower quadrant colostomy Vascular: No leg edema  Skin: Mild acne type rash over the trunk  Portacath/PICC-without erythema  Lab Results:  Lab Results  Component Value Date   WBC 3.7 (L) 01/27/2017   HGB 10.7 (L) 01/27/2017   HCT 32.8 (L) 01/27/2017   MCV 83.9 01/27/2017   PLT 133 (L) 01/27/2017   NEUTROABS 2.6 01/27/2017    CMP     Component Value Date/Time   NA 143 01/27/2017 0912   K 3.0 (LL) 01/27/2017 0912   CL 103 08/20/2016 0451   CL 108 (H) 08/30/2012 0903   CO2 20 (L) 01/27/2017 0912   GLUCOSE 95 01/27/2017 0912   GLUCOSE 96 08/30/2012 0903   BUN 12.4 01/27/2017 0912   CREATININE 0.8 01/27/2017 0912   CALCIUM 8.6 01/27/2017 0912   PROT 6.2 (L) 01/27/2017 0912   ALBUMIN 3.6 01/27/2017 0912   AST 17 01/27/2017 0912   ALT 20 01/27/2017 0912   ALKPHOS 94 01/27/2017 0912   BILITOT <0.22 01/27/2017 0912   GFRNONAA >60 08/20/2016 0451   GFRAA >60 08/20/2016 0451    Lab  Results  Component Value Date   CEA1 22.09 (H) 01/27/2017    Medications: I have reviewed the patient's current medications.  Assessment/Plan: 1. Rectal cancer. Partially obstructing mass noted 1-2 cm from the anal verge on a colonoscopy 03/06/2012. Endoscopic ultrasound 03/14/2012 with a 7.5 mm thick, 3.2 cm wide hypoechoic, irregularly bordered mass that clearly passed into and through the muscularis propria layer of the distal rectal wall (uT3); 3 small (largest 7 mm) perirectal lymph nodes. The lymph nodes were all round, discrete, hypoechoic, homogenous; suspicious for malignant involvement (uN1).  No RAS mutation identified by [facility] 1 testing on the colon resection specimen 06/29/2012, APC alteration identified, microsatellite stable  He began radiation and concurrent Xeloda chemotherapy on 03/20/2012, completed 04/27/2012.   Low anterior resection/coloanal anastomosis and diverting ileostomy 06/29/2012 with the final pathology revealing a T2N0 tumor with extensive fibrosis and negative margins.   Cycle 1 of adjuvant CAPOX 07/19/2012. Cycle 5 of adjuvant CAPOX 10/11/2012.   CEA 2.5 on 11/14/2012.   CEA 14.6 03/08/2013.   Restaging CT evaluation 03/08/2013 with a 4 mm pulmonary nodule left lung base not identified on comparison exam; new liver lesions including a 29 x 26 mm irregular peripheral enhancing rounded lesion in the dome of the right hepatic lobe, a less well-defined new  subcapsular lesion in the lateral right hepatic lobe measuring 12 mm, a new subcapsular lesion in the anterior right hepatic lobe adjacent to the gallbladder fossa measuring 10 mm; a rounded low-density lesion in the inferior right hepatic lobe measuring 10 mm compared to 7 mm on the prior study (radiologist commented this may represent an enlarged cyst).   MRI of the abdomen 04/12/2013 confirmed multiple T2 hyperintense metastatic lesions throughout the right liver   Initiation of  FOLFIRI/Avastin with genotype based irinotecan dosing per the [facility] study 04/05/2013.   Restaging CT evaluation on 06/20/2013 (after 2 cycles/4 treatments) showed improvement in the liver metastases and stable size of a left lower lobe pulmonary nodule now with central cavitation.   Continuation of FOLFIRI/Avastin.   Restaging CT evaluation 08/14/2013-decrease in the size of liver metastases, slight decrease in the size of a cavitary left lower lobe nodule, no evidence of disease progression.   Status post right hepatic lobectomy 09/17/2013. Pathology showed multiple foci of metastatic adenocarcinoma (5 nodules of metastatic adenocarcinoma 4 of which are subcapsular with the nodules ranging in size from 0.6-1.8 cm in greatest dimension). Margins not involved. Biopsy of a portal lymph node showed benign adipose tissue; no lymph node tissue or malignancy.   Normal CEA 11/06/2013   CT 11/06/2013 with a new pleural-based right lower chest lesion, slight in enlargement of the a left-sided lung nodule  CT 01/29/2014 with a decrease in the right lower chest pleural-based lesion and a slight increase of a left-sided lung lesion, other lung lesions were stable  CT chest 05/02/2014 with a decrease in the size of a right lower lobe pulmonary nodule months similar size of a dominant left lower lobe nodule, minimal enlargement of a smaller left lower lobe nodule, no new site of disease  CT chest 11/07/2014 with a slight increase in several left-sided lung nodules  CT chest 02/10/2015 with new small left hilar lymph nodes, a possible new left lower lobe nodule, and stability of other lung nodules  PET scan 03/12/2015 with hypermetabolic left hilar nodes, hypermetabolic left lower lobe nodules, hyper metabolic retroperitoneal nodes, intense hypermetabolism at the coloanal anastomosis, and hypermetabolic thickening in the presacral space  Cycle 1 FOLFIRI/PANITUMUMAB 05/21/2015  Cycle 2  FOLFIRI/PANITUMUMAB 06/05/2015  Cycle 3 FOLFIRI/PANITUMUMAB 06/19/2015  Cycle 4 FOLFIRI 07/10/2015  Cycle 5 FOLFIRI 07/24/2015  Restaging CTs 08/06/2015-resolution of hilar/retroperitoneal adenopathy, improvement in the hypermetabolic lung nodule, other lung nodules are stable, no new lesions  Cycle 6 FOLFIRI with PANITUMUMAB 08/28/2015 -panitumumab dose reduced  Cycle 7 FOLFIRI 09/11/2015-no panitumumab given  Cycle 8 FOLFIRI with PANITUMUMAB 09/25/2015-PANITUMUMAB dose reduced  Cycle 9 FOLFIRI 10/09/2015-no PANITUMUMAB given  Cycle 10 FOLFIRI with panitumumab 10/23/2015  CTs 11/06/2015-possible slight enlargement of a left lower lobe nodule, no other evidence of disease progression.  CTs 02/16/2016-enlargement of left-sided pulmonary nodules and mediastinal/left hilar nodes, improved splenomegaly  CTs 06/21/2016-increased size of mediastinal/left hilar nodes, increased left lung nodules, and increased soft tissue at the porta hepatis  CTs 10/28/2016-progressive disease in the chest and abdomen with an enlarging left hilar mass and slight interval enlargement of pulmonary lesions. Right upper quadrant necrotic nodal mass significantly increased in size with mass effect on the liver and possible invasion. Significant compression of the intrahepatic IVC. New right hepatic lobe lesion. Moderate pelvic ascites.  Cycle 1 FOLFIRI/PANITUMUMAB 11/04/2016  Cycle 2 FOLFIRI/PANITUMUMAB 11/18/2016  Cycle 3 FOLFIRI/panitumumab 12/02/2016  Cycle4 FOLFIRI/PANITUMUMAB 12/15/2016  Cycle 5 FOLFIRI/PANITUMUMAB 12/30/2016  CTs 01/06/2017-no evidence of progressive disease, decreased chest adenopathy,  stable left lower lobe nodules, decreased liver metastasis, decreased right retroperitoneal mass  Cycle 6 FOLFIRI/panitumumab 01/13/2017  Cycle 7 FOLFIRI/Panitumumab 01/27/2017  Cycle 8 FOLFIRI/panitumumab 02/17/2017 2. History ofIrregular bowel habits/rectal bleeding secondary to  #1. 3. History of Mild elevation of the liver enzymes. Question secondary to hepatic steatosis. 4. Indeterminate 8 mm posterior right liver lesion on the staging CT 03/06/2012. 5. Mildly elevated CEA at 5.7 on 03/06/2012. 6. History of radiation erythema at the groin and perineum 7. 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 No clinical evidence of thrombophlebitis or extravasation. 8. Delayed nausea following chemotherapy-Decadron prophylaxis was added with cycle 3 CAPOX-improved. 9. Ileostomy takedown 12/07/2012. 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 11. Port-A-Cath placement 12/31/204 12. Severe neutropenia secondary to chemotherapy following cycle 1 of FOLFIRI, chemotherapy was dose reduced and he received Neulasta with day 15 cycle 1  13. Nausea and vomiting following cycle 1 of FOLFIRI 14. Rectal stricture-manual/colonoscopic dilatation by Dr. Ardis Hughs 03/01/2016  APR 08/17/2016-no evidence of malignancy 15. History ofLeukopenia/Thrombocytopenia-persistent, potentially a sequelae of chemotherapy or hepatic toxicity from chemotherapy/radiation. Bone marrow biopsy 11/20/2014 showed cellular bone marrow with trilineage hematopoiesis. Significant dyspoiesis was not present and there was no evidence of metastatic carcinoma. Cytogenetic analysis showed the presence of normal male chromosomes with no observable clonal chromosomal abnormalities.  probable cirrhosis 16. Genetic testing-negative genetic panel in March 2014 17. 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 Severe over the face-steroid Dosepak prescribed 07/03/2015. Improved 07/10/2015. Further improved 07/24/2015, 08/07/2015, 08/28/2015 18. Diarrhea secondary to chemotherapy-encouraged to use Imodium  19. History of Paronychia secondary to PANITUMUMAB 20. 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  Complains of pain in the back and at the left parotid region.  He may have inflammation of the parotid.  He will try sucking on candy to see if this helps.  He will contact us if this is not improved and we will make an ENT referral.  I doubt the parotid or back discomfort are related to rectal cancer.  We will arrange for imaging of the thoracic spine if the back pain is consistent.  I reviewed the chest CT from 01/06/2017 and there is no apparent abnormality at the mid thoracic spine.  The plan is to proceed with FOLFIRI/panitumumab today.  He will return for an office visit and chemotherapy in 2 weeks.  His ECOG performance status is measured at 1 today.  25 minutes were spent with the patient today.  The majority of the time was used for counseling and coordination of care.  Betsy Coder, MD  02/17/2017  9:02 AM

## 2017-02-17 NOTE — Patient Instructions (Signed)
[facility] Discharge Instructions for Patients Receiving Chemotherapy  Today you received the following chemotherapy agents: Vectibix, Irinotecan, Leucovorin and Adrucil   To help prevent nausea and vomiting after your treatment, we encourage you to take your nausea medication as directed.   If you develop nausea and vomiting that is not controlled by your nausea medication, call the clinic.   BELOW ARE SYMPTOMS THAT SHOULD BE REPORTED IMMEDIATELY:  *FEVER GREATER THAN 100.5 F  *CHILLS WITH OR WITHOUT FEVER  NAUSEA AND VOMITING THAT IS NOT CONTROLLED WITH YOUR NAUSEA MEDICATION  *UNUSUAL SHORTNESS OF BREATH  *UNUSUAL BRUISING OR BLEEDING  TENDERNESS IN MOUTH AND THROAT WITH OR WITHOUT PRESENCE OF ULCERS  *URINARY PROBLEMS  *BOWEL PROBLEMS  UNUSUAL RASH Items with * indicate a potential emergency and should be followed up as soon as possible.  Feel free to call the clinic should you have any questions or concerns. The clinic phone number is [phone].  Please show the [place] at check-in to the Emergency Department and triage nurse.

## 2017-02-18 ENCOUNTER — Telehealth: Payer: Self-pay | Admitting: Oncology

## 2017-02-18 NOTE — Telephone Encounter (Signed)
Per 11/29 los - F/u 12/13 as scheduled- appts already scheduled , no additional appts needed.

## 2017-02-19 ENCOUNTER — Ambulatory Visit (HOSPITAL_BASED_OUTPATIENT_CLINIC_OR_DEPARTMENT_OTHER): Payer: BC Managed Care – PPO

## 2017-02-19 ENCOUNTER — Ambulatory Visit: Payer: BC Managed Care – PPO

## 2017-02-19 VITALS — BP 128/80 | HR 64 | Temp 98.3°F | Resp 17

## 2017-02-19 DIAGNOSIS — Z5189 Encounter for other specified aftercare: Secondary | ICD-10-CM

## 2017-02-19 DIAGNOSIS — C787 Secondary malignant neoplasm of liver and intrahepatic bile duct: Principal | ICD-10-CM

## 2017-02-19 DIAGNOSIS — C20 Malignant neoplasm of rectum: Secondary | ICD-10-CM | POA: Diagnosis not present

## 2017-02-19 MED ORDER — SODIUM CHLORIDE 0.9 % IJ SOLN
10.0000 mL | INTRAMUSCULAR | Status: DC | PRN
  Administered 2017-02-19: 10 mL
  Filled 2017-02-19: qty 10

## 2017-02-19 MED ORDER — HEPARIN SOD (PORK) LOCK FLUSH 100 UNIT/ML IV SOLN
500.0000 [IU] | Freq: Once | INTRAVENOUS | Status: AC | PRN
  Administered 2017-02-19: 500 [IU]
  Filled 2017-02-19: qty 5

## 2017-02-19 MED ORDER — PEGFILGRASTIM INJECTION 6 MG/0.6ML ~~LOC~~
6.0000 mg | PREFILLED_SYRINGE | Freq: Once | SUBCUTANEOUS | Status: AC
  Administered 2017-02-19: 6 mg via SUBCUTANEOUS

## 2017-02-27 ENCOUNTER — Other Ambulatory Visit: Payer: Self-pay | Admitting: Oncology

## 2017-03-02 ENCOUNTER — Other Ambulatory Visit: Payer: Self-pay | Admitting: Nurse Practitioner

## 2017-03-02 DIAGNOSIS — C787 Secondary malignant neoplasm of liver and intrahepatic bile duct: Principal | ICD-10-CM

## 2017-03-02 DIAGNOSIS — C20 Malignant neoplasm of rectum: Secondary | ICD-10-CM

## 2017-03-03 ENCOUNTER — Encounter: Payer: Self-pay | Admitting: Nurse Practitioner

## 2017-03-03 ENCOUNTER — Ambulatory Visit (HOSPITAL_BASED_OUTPATIENT_CLINIC_OR_DEPARTMENT_OTHER): Payer: BC Managed Care – PPO

## 2017-03-03 ENCOUNTER — Other Ambulatory Visit (HOSPITAL_BASED_OUTPATIENT_CLINIC_OR_DEPARTMENT_OTHER): Payer: BC Managed Care – PPO

## 2017-03-03 ENCOUNTER — Ambulatory Visit (HOSPITAL_BASED_OUTPATIENT_CLINIC_OR_DEPARTMENT_OTHER): Payer: BC Managed Care – PPO | Admitting: Nurse Practitioner

## 2017-03-03 ENCOUNTER — Telehealth: Payer: Self-pay | Admitting: Oncology

## 2017-03-03 ENCOUNTER — Ambulatory Visit: Payer: BC Managed Care – PPO

## 2017-03-03 VITALS — BP 121/81 | HR 86 | Temp 98.2°F | Resp 18 | Ht 70.0 in | Wt 142.7 lb

## 2017-03-03 DIAGNOSIS — C20 Malignant neoplasm of rectum: Secondary | ICD-10-CM

## 2017-03-03 DIAGNOSIS — N5082 Scrotal pain: Secondary | ICD-10-CM | POA: Diagnosis not present

## 2017-03-03 DIAGNOSIS — Z5111 Encounter for antineoplastic chemotherapy: Secondary | ICD-10-CM

## 2017-03-03 DIAGNOSIS — C787 Secondary malignant neoplasm of liver and intrahepatic bile duct: Secondary | ICD-10-CM

## 2017-03-03 DIAGNOSIS — M545 Low back pain: Secondary | ICD-10-CM

## 2017-03-03 DIAGNOSIS — R21 Rash and other nonspecific skin eruption: Secondary | ICD-10-CM | POA: Diagnosis not present

## 2017-03-03 DIAGNOSIS — Z5112 Encounter for antineoplastic immunotherapy: Secondary | ICD-10-CM

## 2017-03-03 DIAGNOSIS — Z95828 Presence of other vascular implants and grafts: Secondary | ICD-10-CM

## 2017-03-03 LAB — CBC WITH DIFFERENTIAL/PLATELET
BASO%: 0.2 % (ref 0.0–2.0)
BASOS ABS: 0 10*3/uL (ref 0.0–0.1)
EOS ABS: 0.2 10*3/uL (ref 0.0–0.5)
EOS%: 4.3 % (ref 0.0–7.0)
HEMATOCRIT: 35.6 % — AB (ref 38.4–49.9)
HEMOGLOBIN: 11.3 g/dL — AB (ref 13.0–17.1)
LYMPH#: 0.6 10*3/uL — AB (ref 0.9–3.3)
LYMPH%: 11.5 % — ABNORMAL LOW (ref 14.0–49.0)
MCH: 28 pg (ref 27.2–33.4)
MCHC: 31.7 g/dL — ABNORMAL LOW (ref 32.0–36.0)
MCV: 88.3 fL (ref 79.3–98.0)
MONO#: 0.5 10*3/uL (ref 0.1–0.9)
MONO%: 9.3 % (ref 0.0–14.0)
NEUT%: 74.7 % (ref 39.0–75.0)
NEUTROS ABS: 4.2 10*3/uL (ref 1.5–6.5)
PLATELETS: 129 10*3/uL — AB (ref 140–400)
RBC: 4.03 10*6/uL — ABNORMAL LOW (ref 4.20–5.82)
RDW: 18.4 % — AB (ref 11.0–14.6)
WBC: 5.6 10*3/uL (ref 4.0–10.3)

## 2017-03-03 LAB — COMPREHENSIVE METABOLIC PANEL
ALT: 13 U/L (ref 0–55)
ANION GAP: 11 meq/L (ref 3–11)
AST: 15 U/L (ref 5–34)
Albumin: 3.7 g/dL (ref 3.5–5.0)
Alkaline Phosphatase: 91 U/L (ref 40–150)
BUN: 11.1 mg/dL (ref 7.0–26.0)
CHLORIDE: 108 meq/L (ref 98–109)
CO2: 21 meq/L — AB (ref 22–29)
Calcium: 8.8 mg/dL (ref 8.4–10.4)
Creatinine: 0.8 mg/dL (ref 0.7–1.3)
GLUCOSE: 104 mg/dL (ref 70–140)
Potassium: 3.5 mEq/L (ref 3.5–5.1)
SODIUM: 140 meq/L (ref 136–145)
TOTAL PROTEIN: 6.4 g/dL (ref 6.4–8.3)
Total Bilirubin: <0.22 mg/dL (ref 0.20–1.20)

## 2017-03-03 LAB — MAGNESIUM: MAGNESIUM: 2.4 mg/dL (ref 1.5–2.5)

## 2017-03-03 LAB — CEA (IN HOUSE-CHCC): CEA (CHCC-IN HOUSE): 29.1 ng/mL — AB (ref 0.00–5.00)

## 2017-03-03 MED ORDER — DEXTROSE 5 % IV SOLN
135.0000 mg/m2 | Freq: Once | INTRAVENOUS | Status: AC
  Administered 2017-03-03: 240 mg via INTRAVENOUS
  Filled 2017-03-03: qty 12

## 2017-03-03 MED ORDER — SODIUM CHLORIDE 0.9 % IV SOLN
Freq: Once | INTRAVENOUS | Status: AC
  Administered 2017-03-03: 12:00:00 via INTRAVENOUS

## 2017-03-03 MED ORDER — SODIUM CHLORIDE 0.9 % IV SOLN
Freq: Once | INTRAVENOUS | Status: AC
  Administered 2017-03-03: 12:00:00 via INTRAVENOUS
  Filled 2017-03-03: qty 5

## 2017-03-03 MED ORDER — SODIUM CHLORIDE 0.9 % IV SOLN
3.0000 mg/kg | Freq: Once | INTRAVENOUS | Status: AC
  Administered 2017-03-03: 200 mg via INTRAVENOUS
  Filled 2017-03-03: qty 10

## 2017-03-03 MED ORDER — SODIUM CHLORIDE 0.9 % IJ SOLN
10.0000 mL | INTRAMUSCULAR | Status: DC | PRN
  Administered 2017-03-03: 10 mL via INTRAVENOUS
  Filled 2017-03-03: qty 10

## 2017-03-03 MED ORDER — SODIUM CHLORIDE 0.9 % IV SOLN
2400.0000 mg/m2 | INTRAVENOUS | Status: DC
  Administered 2017-03-03: 4300 mg via INTRAVENOUS
  Filled 2017-03-03: qty 86

## 2017-03-03 MED ORDER — DEXTROSE 5 % IV SOLN
400.0000 mg/m2 | Freq: Once | INTRAVENOUS | Status: AC
  Administered 2017-03-03: 716 mg via INTRAVENOUS
  Filled 2017-03-03: qty 35.8

## 2017-03-03 MED ORDER — PALONOSETRON HCL INJECTION 0.25 MG/5ML
INTRAVENOUS | Status: AC
  Filled 2017-03-03: qty 5

## 2017-03-03 MED ORDER — PALONOSETRON HCL INJECTION 0.25 MG/5ML
0.2500 mg | Freq: Once | INTRAVENOUS | Status: AC
  Administered 2017-03-03: 0.25 mg via INTRAVENOUS

## 2017-03-03 NOTE — Telephone Encounter (Signed)
Gave avs and calendar for January 2019 °

## 2017-03-03 NOTE — Patient Instructions (Signed)
[facility] Discharge Instructions for Patients Receiving Chemotherapy  Today you received the following chemotherapy agents Irinotecan, Leucovorin, and Fluorouracil  To help prevent nausea and vomiting after your treatment, we encourage you to take your nausea medication as directed.   If you develop nausea and vomiting that is not controlled by your nausea medication, call the clinic.   BELOW ARE SYMPTOMS THAT SHOULD BE REPORTED IMMEDIATELY:  *FEVER GREATER THAN 100.5 F  *CHILLS WITH OR WITHOUT FEVER  NAUSEA AND VOMITING THAT IS NOT CONTROLLED WITH YOUR NAUSEA MEDICATION  *UNUSUAL SHORTNESS OF BREATH  *UNUSUAL BRUISING OR BLEEDING  TENDERNESS IN MOUTH AND THROAT WITH OR WITHOUT PRESENCE OF ULCERS  *URINARY PROBLEMS  *BOWEL PROBLEMS  UNUSUAL RASH Items with * indicate a potential emergency and should be followed up as soon as possible.  Feel free to call the clinic should you have any questions or concerns. The clinic phone number is [phone].  Please show the [place] at check-in to the Emergency Department and triage nurse.

## 2017-03-03 NOTE — Progress Notes (Addendum)
[place] [facility] OFFICE PROGRESS NOTE   Diagnosis: Rectal cancer  INTERVAL HISTORY:   Mr. Groh returns as scheduled.  He completed another cycle of FOLFIRI/Panitumumab 02/17/2017.  He developed constipation after the chemotherapy.  He notes nausea when he becomes constipated.  He subsequently developed diarrhea.  He continues to have intermittent back pain and pain at the left parotid.  Both are worse when he lays flat.  He noted a small "lymph node" with associated tenderness at the left base of the penis 5 days ago.  The discomfort is much better today.  Skin rash got worse following the chemotherapy, better now.  He continues minocycline.  Objective:  Vital signs in last 24 hours:  Blood pressure 121/81, pulse 86, temperature 98.2 F (36.8 C), temperature source Oral, resp. rate 18, height '5\' 10"'  (1.778 m), weight 142 lb 11.2 oz (64.7 kg), SpO2 100 %.    HEENT: No thrush or ulcers. Resp: Lungs clear bilaterally. Cardio: Regular rate and rhythm. GI: Abdomen soft and nontender.  No hepatomegaly.  Left lower quadrant colostomy. Vascular: No leg edema. GU: Slightly firm linear lesion with mild associated tenderness left upper scrotum.  No scrotal edema, testes without mass Skin: Mild dry appearing acne type rash over the trunk. Port-A-Cath without erythema.   Lab Results:  Lab Results  Component Value Date   WBC 5.6 03/03/2017   HGB 11.3 (L) 03/03/2017   HCT 35.6 (L) 03/03/2017   MCV 88.3 03/03/2017   PLT 129 (L) 03/03/2017   NEUTROABS 4.2 03/03/2017    Imaging:  No results found.  Medications: I have reviewed the patient's current medications.  Assessment/Plan: 1. Rectal cancer. Partially obstructing mass noted 1-2 cm from the anal verge on a colonoscopy 03/06/2012. Endoscopic ultrasound 03/14/2012 with a 7.5 mm thick, 3.2 cm wide hypoechoic, irregularly bordered mass that clearly passed into and through the muscularis propria layer of the distal rectal  wall (uT3); 3 small (largest 7 mm) perirectal lymph nodes. The lymph nodes were all round, discrete, hypoechoic, homogenous; suspicious for malignant involvement (uN1).  No RAS mutation identified by [facility] 1 testing on the colon resection specimen 06/29/2012, APC alteration identified, microsatellite stable  He began radiation and concurrent Xeloda chemotherapy on 03/20/2012, completed 04/27/2012.   Low anterior resection/coloanal anastomosis and diverting ileostomy 06/29/2012 with the final pathology revealing a T2N0 tumor with extensive fibrosis and negative margins.   Cycle 1 of adjuvant CAPOX 07/19/2012. Cycle 5 of adjuvant CAPOX 10/11/2012.   CEA 2.5 on 11/14/2012.   CEA 14.6 03/08/2013.   Restaging CT evaluation 03/08/2013 with a 4 mm pulmonary nodule left lung base not identified on comparison exam; new liver lesions including a 29 x 26 mm irregular peripheral enhancing rounded lesion in the dome of the right hepatic lobe, a less well-defined new subcapsular lesion in the lateral right hepatic lobe measuring 12 mm, a new subcapsular lesion in the anterior right hepatic lobe adjacent to the gallbladder fossa measuring 10 mm; a rounded low-density lesion in the inferior right hepatic lobe measuring 10 mm compared to 7 mm on the prior study (radiologist commented this may represent an enlarged cyst).   MRI of the abdomen 04/12/2013 confirmed multiple T2 hyperintense metastatic lesions throughout the right liver   Initiation of FOLFIRI/Avastin with genotype based irinotecan dosing per the [facility] study 04/05/2013.   Restaging CT evaluation on 06/20/2013 (after 2 cycles/4 treatments) showed improvement in the liver metastases and stable size of a left lower lobe pulmonary nodule now with  central cavitation.   Continuation of FOLFIRI/Avastin.   Restaging CT evaluation 08/14/2013-decrease in the size of liver metastases, slight decrease in the size of a cavitary left lower lobe  nodule, no evidence of disease progression.   Status post right hepatic lobectomy 09/17/2013. Pathology showed multiple foci of metastatic adenocarcinoma (5 nodules of metastatic adenocarcinoma 4 of which are subcapsular with the nodules ranging in size from 0.6-1.8 cm in greatest dimension). Margins not involved. Biopsy of a portal lymph node showed benign adipose tissue; no lymph node tissue or malignancy.   Normal CEA 11/06/2013   CT 11/06/2013 with a new pleural-based right lower chest lesion, slight in enlargement of the a left-sided lung nodule  CT 01/29/2014 with a decrease in the right lower chest pleural-based lesion and a slight increase of a left-sided lung lesion, other lung lesions were stable  CT chest 05/02/2014 with a decrease in the size of a right lower lobe pulmonary nodule months similar size of a dominant left lower lobe nodule, minimal enlargement of a smaller left lower lobe nodule, no new site of disease  CT chest 11/07/2014 with a slight increase in several left-sided lung nodules  CT chest 02/10/2015 with new small left hilar lymph nodes, a possible new left lower lobe nodule, and stability of other lung nodules  PET scan 03/12/2015 with hypermetabolic left hilar nodes, hypermetabolic left lower lobe nodules, hyper metabolic retroperitoneal nodes, intense hypermetabolism at the coloanal anastomosis, and hypermetabolic thickening in the presacral space  Cycle 1 FOLFIRI/PANITUMUMAB 05/21/2015  Cycle 2 FOLFIRI/PANITUMUMAB 06/05/2015  Cycle 3 FOLFIRI/PANITUMUMAB 06/19/2015  Cycle 4 FOLFIRI 07/10/2015  Cycle 5 FOLFIRI 07/24/2015  Restaging CTs 08/06/2015-resolution of hilar/retroperitoneal adenopathy, improvement in the hypermetabolic lung nodule, other lung nodules are stable, no new lesions  Cycle 6 FOLFIRI with PANITUMUMAB 08/28/2015 -panitumumab dose reduced  Cycle 7 FOLFIRI 09/11/2015-no panitumumab given  Cycle 8 FOLFIRI with PANITUMUMAB  09/25/2015-PANITUMUMAB dose reduced  Cycle 9 FOLFIRI 10/09/2015-no PANITUMUMAB given  Cycle 10 FOLFIRI with panitumumab 10/23/2015  CTs 11/06/2015-possible slight enlargement of a left lower lobe nodule, no other evidence of disease progression.  CTs 02/16/2016-enlargement of left-sided pulmonary nodules and mediastinal/left hilar nodes, improved splenomegaly  CTs 06/21/2016-increased size of mediastinal/left hilar nodes, increased left lung nodules, and increased soft tissue at the porta hepatis  CTs 10/28/2016-progressive disease in the chest and abdomen with an enlarging left hilar mass and slight interval enlargement of pulmonary lesions. Right upper quadrant necrotic nodal mass significantly increased in size with mass effect on the liver and possible invasion. Significant compression of the intrahepatic IVC. New right hepatic lobe lesion. Moderate pelvic ascites.  Cycle 1 FOLFIRI/PANITUMUMAB 11/04/2016  Cycle 2 FOLFIRI/PANITUMUMAB 11/18/2016  Cycle 3 FOLFIRI/panitumumab 12/02/2016  Cycle4 FOLFIRI/PANITUMUMAB 12/15/2016  Cycle 5 FOLFIRI/PANITUMUMAB 12/30/2016  CTs 01/06/2017-no evidence of progressive disease, decreased chest adenopathy, stable left lower lobe nodules, decreased liver metastasis, decreased right retroperitoneal mass  Cycle 6 FOLFIRI/panitumumab 01/13/2017  Cycle 7 FOLFIRI/Panitumumab 01/27/2017  Cycle 8 FOLFIRI/panitumumab 02/17/2017  Cycle 9 FOLFIRI/Panitumumab 03/03/2017 2. History ofIrregular bowel habits/rectal bleeding secondary to #1. 3. History of Mild elevation of the liver enzymes. Question secondary to hepatic steatosis. 4. Indeterminate 8 mm posterior right liver lesion on the staging CT 03/06/2012. 5. Mildly elevated CEA at 5.7 on 03/06/2012. 6. History of radiation erythema at the groin and perineum 7. 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 No  clinical evidence of thrombophlebitis or extravasation. 8. Delayed nausea following chemotherapy-Decadron prophylaxis was added  with cycle 3 CAPOX-improved. 9. Ileostomy takedown 12/07/2012. 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 11. Port-A-Cath placement 12/31/204 12. Severe neutropenia secondary to chemotherapy following cycle 1 of FOLFIRI, chemotherapy was dose reduced and he received Neulasta with day 15 cycle 1  13. Nausea and vomiting following cycle 1 of FOLFIRI 14. Rectal stricture-manual/colonoscopic dilatation by Dr. Ardis Hughs 03/01/2016  APR 08/17/2016-no evidence of malignancy 15. History ofLeukopenia/Thrombocytopenia-persistent, potentially a sequelae of chemotherapy or hepatic toxicity from chemotherapy/radiation. Bone marrow biopsy 11/20/2014 showed cellular bone marrow with trilineage hematopoiesis. Significant dyspoiesis was not present and there was no evidence of metastatic carcinoma. Cytogenetic analysis showed the presence of normal male chromosomes with no observable clonal chromosomal abnormalities.  probable cirrhosis 16. Genetic testing-negative genetic panel in March 2014 17. 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 Severe over the face-steroid Dosepak prescribed 07/03/2015. Improved 07/10/2015. Further improved 07/24/2015, 08/07/2015, 08/28/2015 18. Diarrhea secondary to chemotherapy-encouraged to use Imodium  19. History of Paronychia secondary to PANITUMUMAB 20. 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 Criado appears stable.  He has completed 8 cycles of FOLFIRI/Panitumumab.  Plan to proceed with cycle 9 today as scheduled.  The etiology of the left scrotal pain/tenderness is unclear.  The discomfort is significantly better today.  Plan to proceed with scrotal ultrasound with persistent pain.  He will return for a follow-up visit and cycle 10 FOLFIRI/panitumumab 03/24/2017.  He will contact the office in the  interim with any problems.  Patient seen with Dr. Benay Spice.  25 minutes were spent face-to-face at today's visit with the majority of that time involved in counseling/coordination of care.  Ned Card ANP/GNP-BC   03/03/2017  11:04 AM Was a shared visit with Ned Card.  Mr. Devine was interviewed and examined.  The area of nodular tenderness at the left scrotum may be related to a lymph node or thrombosed vein.  He will contact us for persistent discomfort in this area.  The plan is to continue FOLFIRI/panitumumab.  Julieanne Manson, MD

## 2017-03-05 ENCOUNTER — Ambulatory Visit (HOSPITAL_BASED_OUTPATIENT_CLINIC_OR_DEPARTMENT_OTHER): Payer: BC Managed Care – PPO

## 2017-03-05 ENCOUNTER — Ambulatory Visit: Payer: BC Managed Care – PPO

## 2017-03-05 VITALS — BP 113/75 | HR 76 | Temp 97.8°F | Resp 18

## 2017-03-05 DIAGNOSIS — C20 Malignant neoplasm of rectum: Secondary | ICD-10-CM | POA: Diagnosis not present

## 2017-03-05 DIAGNOSIS — C787 Secondary malignant neoplasm of liver and intrahepatic bile duct: Secondary | ICD-10-CM

## 2017-03-05 DIAGNOSIS — Z5189 Encounter for other specified aftercare: Secondary | ICD-10-CM | POA: Diagnosis not present

## 2017-03-05 MED ORDER — PEGFILGRASTIM INJECTION 6 MG/0.6ML ~~LOC~~
6.0000 mg | PREFILLED_SYRINGE | Freq: Once | SUBCUTANEOUS | Status: AC
  Administered 2017-03-05: 6 mg via SUBCUTANEOUS

## 2017-03-05 MED ORDER — SODIUM CHLORIDE 0.9 % IJ SOLN
10.0000 mL | INTRAMUSCULAR | Status: DC | PRN
  Administered 2017-03-05: 10 mL
  Filled 2017-03-05: qty 10

## 2017-03-05 MED ORDER — HEPARIN SOD (PORK) LOCK FLUSH 100 UNIT/ML IV SOLN
500.0000 [IU] | Freq: Once | INTRAVENOUS | Status: AC | PRN
  Administered 2017-03-05: 500 [IU]
  Filled 2017-03-05: qty 5

## 2017-03-05 NOTE — Patient Instructions (Signed)
Implanted Port Home Guide An implanted port is a type of central line that is placed under the skin. Central lines are used to provide IV access when treatment or nutrition needs to be given through a person's veins. Implanted ports are used for long-term IV access. An implanted port may be placed because:  You need IV medicine that would be irritating to the small veins in your hands or arms.  You need long-term IV medicines, such as antibiotics.  You need IV nutrition for a long period.  You need frequent blood draws for lab tests.  You need dialysis.  Implanted ports are usually placed in the chest area, but they can also be placed in the upper arm, the abdomen, or the leg. An implanted port has two main parts:  Reservoir. The reservoir is round and will appear as a small, raised area under your skin. The reservoir is the part where a needle is inserted to give medicines or draw blood.  Catheter. The catheter is a thin, flexible tube that extends from the reservoir. The catheter is placed into a large vein. Medicine that is inserted into the reservoir goes into the catheter and then into the vein.  How will I care for my incision site? Do not get the incision site wet. Bathe or shower as directed by your health care provider. How is my port accessed? Special steps must be taken to access the port:  Before the port is accessed, a numbing cream can be placed on the skin. This helps numb the skin over the port site.  Your health care provider uses a sterile technique to access the port. ? Your health care provider must put on a mask and sterile gloves. ? The skin over your port is cleaned carefully with an antiseptic and allowed to dry. ? The port is gently pinched between sterile gloves, and a needle is inserted into the port.  Only "non-coring" port needles should be used to access the port. Once the port is accessed, a blood return should be checked. This helps ensure that the port  is in the vein and is not clogged.  If your port needs to remain accessed for a constant infusion, a clear (transparent) bandage will be placed over the needle site. The bandage and needle will need to be changed every week, or as directed by your health care provider.  Keep the bandage covering the needle clean and dry. Do not get it wet. Follow your health care provider's instructions on how to take a shower or bath while the port is accessed.  If your port does not need to stay accessed, no bandage is needed over the port.  What is flushing? Flushing helps keep the port from getting clogged. Follow your health care provider's instructions on how and when to flush the port. Ports are usually flushed with saline solution or a medicine called heparin. The need for flushing will depend on how the port is used.  If the port is used for intermittent medicines or blood draws, the port will need to be flushed: ? After medicines have been given. ? After blood has been drawn. ? As part of routine maintenance.  If a constant infusion is running, the port may not need to be flushed.  How long will my port stay implanted? The port can stay in for as long as your health care provider thinks it is needed. When it is time for the port to come out, surgery will be   done to remove it. The procedure is similar to the one performed when the port was put in. When should I seek immediate medical care? When you have an implanted port, you should seek immediate medical care if:  You notice a bad smell coming from the incision site.  You have swelling, redness, or drainage at the incision site.  You have more swelling or pain at the port site or the surrounding area.  You have a fever that is not controlled with medicine.  This information is not intended to replace advice given to you by your health care provider. Make sure you discuss any questions you have with your health care provider. Document  Released: 03/08/2005 Document Revised: 08/14/2015 Document Reviewed: 11/13/2012 Elsevier Interactive Patient Education  2017 Elsevier Inc.  

## 2017-03-05 NOTE — Patient Instructions (Signed)
Pegfilgrastim injection What is this medicine? PEGFILGRASTIM (PEG fil gra stim) is a long-acting granulocyte colony-stimulating factor that stimulates the growth of neutrophils, a type of white blood cell important in the body's fight against infection. It is used to reduce the incidence of fever and infection in patients with certain types of cancer who are receiving chemotherapy that affects the bone marrow, and to increase survival after being exposed to high doses of radiation. This medicine may be used for other purposes; ask your health care provider or pharmacist if you have questions. COMMON BRAND NAME(S): Neulasta What should I tell my health care provider before I take this medicine? They need to know if you have any of these conditions: -kidney disease -latex allergy -ongoing radiation therapy -sickle cell disease -skin reactions to acrylic adhesives (On-Body Injector only) -an unusual or allergic reaction to pegfilgrastim, filgrastim, other medicines, foods, dyes, or preservatives -pregnant or trying to get pregnant -breast-feeding How should I use this medicine? This medicine is for injection under the skin. If you get this medicine at home, you will be taught how to prepare and give the pre-filled syringe or how to use the On-body Injector. Refer to the patient Instructions for Use for detailed instructions. Use exactly as directed. Tell your healthcare provider immediately if you suspect that the On-body Injector may not have performed as intended or if you suspect the use of the On-body Injector resulted in a missed or partial dose. It is important that you put your used needles and syringes in a special sharps container. Do not put them in a trash can. If you do not have a sharps container, call your pharmacist or healthcare provider to get one. Talk to your pediatrician regarding the use of this medicine in children. While this drug may be prescribed for selected conditions,  precautions do apply. Overdosage: If you think you have taken too much of this medicine contact a poison control center or emergency room at once. NOTE: This medicine is only for you. Do not share this medicine with others. What if I miss a dose? It is important not to miss your dose. Call your doctor or health care professional if you miss your dose. If you miss a dose due to an On-body Injector failure or leakage, a new dose should be administered as soon as possible using a single prefilled syringe for manual use. What may interact with this medicine? Interactions have not been studied. Give your health care provider a list of all the medicines, herbs, non-prescription drugs, or dietary supplements you use. Also tell them if you smoke, drink alcohol, or use illegal drugs. Some items may interact with your medicine. This list may not describe all possible interactions. Give your health care provider a list of all the medicines, herbs, non-prescription drugs, or dietary supplements you use. Also tell them if you smoke, drink alcohol, or use illegal drugs. Some items may interact with your medicine. What should I watch for while using this medicine? You may need blood work done while you are taking this medicine. If you are going to need a MRI, CT scan, or other procedure, tell your doctor that you are using this medicine (On-Body Injector only). What side effects may I notice from receiving this medicine? Side effects that you should report to your doctor or health care professional as soon as possible: -allergic reactions like skin rash, itching or hives, swelling of the face, lips, or tongue -dizziness -fever -pain, redness, or irritation at site   where injected -pinpoint red spots on the skin -red or dark-brown urine -shortness of breath or breathing problems -stomach or side pain, or pain at the shoulder -swelling -tiredness -trouble passing urine or change in the amount of urine Side  effects that usually do not require medical attention (report to your doctor or health care professional if they continue or are bothersome): -bone pain -muscle pain This list may not describe all possible side effects. Call your doctor for medical advice about side effects. You may report side effects to FDA at 1-800-FDA-1088. Where should I keep my medicine? Keep out of the reach of children. Store pre-filled syringes in a refrigerator between 2 and 8 degrees C (36 and 46 degrees F). Do not freeze. Keep in carton to protect from light. Throw away this medicine if it is left out of the refrigerator for more than 48 hours. Throw away any unused medicine after the expiration date. NOTE: This sheet is a summary. It may not cover all possible information. If you have questions about this medicine, talk to your doctor, pharmacist, or health care provider.  2018 Elsevier/Gold Standard (2016-03-04 12:58:03)  

## 2017-03-20 ENCOUNTER — Other Ambulatory Visit: Payer: Self-pay | Admitting: Oncology

## 2017-03-24 ENCOUNTER — Other Ambulatory Visit (HOSPITAL_BASED_OUTPATIENT_CLINIC_OR_DEPARTMENT_OTHER): Payer: BC Managed Care – PPO

## 2017-03-24 ENCOUNTER — Ambulatory Visit (HOSPITAL_BASED_OUTPATIENT_CLINIC_OR_DEPARTMENT_OTHER): Payer: BC Managed Care – PPO

## 2017-03-24 ENCOUNTER — Ambulatory Visit: Payer: BC Managed Care – PPO | Admitting: Nurse Practitioner

## 2017-03-24 ENCOUNTER — Other Ambulatory Visit: Payer: Self-pay | Admitting: *Deleted

## 2017-03-24 ENCOUNTER — Telehealth: Payer: Self-pay | Admitting: Nurse Practitioner

## 2017-03-24 ENCOUNTER — Encounter: Payer: Self-pay | Admitting: Nurse Practitioner

## 2017-03-24 VITALS — BP 128/89 | HR 69 | Temp 98.9°F | Resp 20 | Ht 70.0 in | Wt 142.2 lb

## 2017-03-24 DIAGNOSIS — Z5112 Encounter for antineoplastic immunotherapy: Secondary | ICD-10-CM

## 2017-03-24 DIAGNOSIS — M545 Low back pain: Secondary | ICD-10-CM | POA: Diagnosis not present

## 2017-03-24 DIAGNOSIS — C20 Malignant neoplasm of rectum: Secondary | ICD-10-CM

## 2017-03-24 DIAGNOSIS — C787 Secondary malignant neoplasm of liver and intrahepatic bile duct: Secondary | ICD-10-CM

## 2017-03-24 DIAGNOSIS — K5909 Other constipation: Secondary | ICD-10-CM | POA: Diagnosis not present

## 2017-03-24 DIAGNOSIS — R194 Change in bowel habit: Secondary | ICD-10-CM | POA: Diagnosis not present

## 2017-03-24 DIAGNOSIS — Z5111 Encounter for antineoplastic chemotherapy: Secondary | ICD-10-CM | POA: Diagnosis not present

## 2017-03-24 DIAGNOSIS — R112 Nausea with vomiting, unspecified: Secondary | ICD-10-CM | POA: Diagnosis not present

## 2017-03-24 DIAGNOSIS — Z95828 Presence of other vascular implants and grafts: Secondary | ICD-10-CM

## 2017-03-24 LAB — CBC WITH DIFFERENTIAL/PLATELET
BASO%: 1 % (ref 0.0–2.0)
BASOS ABS: 0.1 10*3/uL (ref 0.0–0.1)
EOS%: 2.9 % (ref 0.0–7.0)
Eosinophils Absolute: 0.2 10*3/uL (ref 0.0–0.5)
HEMATOCRIT: 34.4 % — AB (ref 38.4–49.9)
HGB: 11.3 g/dL — ABNORMAL LOW (ref 13.0–17.1)
LYMPH#: 0.5 10*3/uL — AB (ref 0.9–3.3)
LYMPH%: 8.5 % — AB (ref 14.0–49.0)
MCH: 28.2 pg (ref 27.2–33.4)
MCHC: 32.8 g/dL (ref 32.0–36.0)
MCV: 86 fL (ref 79.3–98.0)
MONO#: 0.4 10*3/uL (ref 0.1–0.9)
MONO%: 6.2 % (ref 0.0–14.0)
NEUT#: 5.2 10*3/uL (ref 1.5–6.5)
NEUT%: 81.4 % — AB (ref 39.0–75.0)
PLATELETS: 121 10*3/uL — AB (ref 140–400)
RBC: 4 10*6/uL — AB (ref 4.20–5.82)
RDW: 19.7 % — ABNORMAL HIGH (ref 11.0–14.6)
WBC: 6.4 10*3/uL (ref 4.0–10.3)

## 2017-03-24 LAB — COMPREHENSIVE METABOLIC PANEL
ALBUMIN: 3.6 g/dL (ref 3.5–5.0)
ALK PHOS: 67 U/L (ref 40–150)
ALT: 14 U/L (ref 0–55)
AST: 19 U/L (ref 5–34)
Anion Gap: 9 mEq/L (ref 3–11)
BUN: 12.2 mg/dL (ref 7.0–26.0)
CO2: 27 meq/L (ref 22–29)
Calcium: 8.8 mg/dL (ref 8.4–10.4)
Chloride: 106 mEq/L (ref 98–109)
Creatinine: 0.7 mg/dL (ref 0.7–1.3)
GLUCOSE: 96 mg/dL (ref 70–140)
POTASSIUM: 4 meq/L (ref 3.5–5.1)
SODIUM: 142 meq/L (ref 136–145)
Total Bilirubin: <0.22 mg/dL (ref 0.20–1.20)
Total Protein: 6.1 g/dL — ABNORMAL LOW (ref 6.4–8.3)

## 2017-03-24 LAB — CEA (IN HOUSE-CHCC): CEA (CHCC-In House): 50.1 ng/mL — ABNORMAL HIGH (ref 0.00–5.00)

## 2017-03-24 MED ORDER — PALONOSETRON HCL INJECTION 0.25 MG/5ML
INTRAVENOUS | Status: AC
  Filled 2017-03-24: qty 5

## 2017-03-24 MED ORDER — IRINOTECAN HCL CHEMO INJECTION 100 MG/5ML
135.0000 mg/m2 | Freq: Once | INTRAVENOUS | Status: AC
  Administered 2017-03-24: 240 mg via INTRAVENOUS
  Filled 2017-03-24: qty 12

## 2017-03-24 MED ORDER — OXYCODONE HCL 5 MG PO TABS: 5.0000 mg | ORAL_TABLET | Freq: Every day | ORAL | 0 refills | Status: DC

## 2017-03-24 MED ORDER — SODIUM CHLORIDE 0.9 % IJ SOLN
10.0000 mL | INTRAMUSCULAR | Status: DC | PRN
  Administered 2017-03-24: 10 mL via INTRAVENOUS
  Filled 2017-03-24: qty 10

## 2017-03-24 MED ORDER — SODIUM CHLORIDE 0.9 % IV SOLN
2400.0000 mg/m2 | INTRAVENOUS | Status: DC
  Administered 2017-03-24: 4300 mg via INTRAVENOUS
  Filled 2017-03-24: qty 86

## 2017-03-24 MED ORDER — SODIUM CHLORIDE 0.9 % IV SOLN
3.0000 mg/kg | Freq: Once | INTRAVENOUS | Status: AC
  Administered 2017-03-24: 200 mg via INTRAVENOUS
  Filled 2017-03-24: qty 10

## 2017-03-24 MED ORDER — SODIUM CHLORIDE 0.9 % IV SOLN
Freq: Once | INTRAVENOUS | Status: AC
  Administered 2017-03-24: 12:00:00 via INTRAVENOUS

## 2017-03-24 MED ORDER — OXYCODONE HCL 5 MG PO TABS: 5.0000 mg | ORAL_TABLET | Freq: Four times a day (QID) | ORAL | 0 refills | Status: DC | PRN

## 2017-03-24 MED ORDER — LEUCOVORIN CALCIUM INJECTION 350 MG
400.0000 mg/m2 | Freq: Once | INTRAVENOUS | Status: AC
  Administered 2017-03-24: 716 mg via INTRAVENOUS
  Filled 2017-03-24: qty 35.8

## 2017-03-24 MED ORDER — PALONOSETRON HCL INJECTION 0.25 MG/5ML
0.2500 mg | Freq: Once | INTRAVENOUS | Status: AC
  Administered 2017-03-24: 0.25 mg via INTRAVENOUS

## 2017-03-24 MED ORDER — FOSAPREPITANT DIMEGLUMINE INJECTION 150 MG
Freq: Once | INTRAVENOUS | Status: AC
  Administered 2017-03-24: 13:00:00 via INTRAVENOUS
  Filled 2017-03-24: qty 5

## 2017-03-24 NOTE — Progress Notes (Signed)
[place] OFFICE PROGRESS NOTE   Diagnosis: Rectal cancer  INTERVAL HISTORY:   Jacob [name] returns as scheduled.  He completed another cycle of FOLFIRI/Panitumumab 03/03/2017.  He has intermittent nausea/vomiting.  Bowel habits continue to be erratic.  He tends to develop constipation after the chemotherapy followed by diarrhea.  He continues to have right mid back pain and pain at the left parotid.  The pain is worse when he lays flat.  He has tried Tylenol and ibuprofen.  He has oxycodone but does not take it much due to fear of constipation.  No leg weakness or numbness.  Skin rash is better.  Objective:  Vital signs in last 24 hours:  Blood pressure 128/89, pulse 69, temperature 98.9 F (37.2 C), temperature source Oral, resp. rate 20, height '5\' 10"'  (1.778 m), weight 142 lb 3.2 oz (64.5 kg), SpO2 100 %.    HEENT: No thrush or ulcers. Lymphatics: No palpable cervical or supra clavicular lymph nodes. Resp: Lungs clear bilaterally. Cardio: Regular rate and rhythm. GI: Abdomen soft and nontender.  No hepatomegaly.  Left lower quadrant colostomy. Vascular: No leg edema. Musculoskeletal: Nontender over the right mid back.   Skin: Mild dry appearing acne type rash over the trunk. Port-A-Cath without erythema.   Lab Results:  Lab Results  Component Value Date   WBC 6.4 03/24/2017   HGB 11.3 (L) 03/24/2017   HCT 34.4 (L) 03/24/2017   MCV 86.0 03/24/2017   PLT 121 (L) 03/24/2017   NEUTROABS 5.2 03/24/2017    Imaging:  No results found.  Medications: I have reviewed the patient's current medications.  Assessment/Plan: 1. Rectal cancer. Partially obstructing mass noted 1-2 cm from the anal verge on a colonoscopy 03/06/2012. Endoscopic ultrasound 03/14/2012 with a 7.5 mm thick, 3.2 cm wide hypoechoic, irregularly bordered mass that clearly passed into and through the muscularis propria layer of the distal rectal wall (uT3); 3 small (largest 7 mm) perirectal  lymph nodes. The lymph nodes were all round, discrete, hypoechoic, homogenous; suspicious for malignant involvement (uN1).  No RAS mutation identified by [facility] 1 testing on the colon resection specimen 06/29/2012, APC alteration identified, microsatellite stable  He began radiation and concurrent Xeloda chemotherapy on 03/20/2012, completed 04/27/2012.   Low anterior resection/coloanal anastomosis and diverting ileostomy 06/29/2012 with the final pathology revealing a T2N0 tumor with extensive fibrosis and negative margins.   Cycle 1 of adjuvant CAPOX 07/19/2012. Cycle 5 of adjuvant CAPOX 10/11/2012.   CEA 2.5 on 11/14/2012.   CEA 14.6 03/08/2013.   Restaging CT evaluation 03/08/2013 with a 4 mm pulmonary nodule left lung base not identified on comparison exam; new liver lesions including a 29 x 26 mm irregular peripheral enhancing rounded lesion in the dome of the right hepatic lobe, a less well-defined new subcapsular lesion in the lateral right hepatic lobe measuring 12 mm, a new subcapsular lesion in the anterior right hepatic lobe adjacent to the gallbladder fossa measuring 10 mm; a rounded low-density lesion in the inferior right hepatic lobe measuring 10 mm compared to 7 mm on the prior study (radiologist commented this may represent an enlarged cyst).   MRI of the abdomen 04/12/2013 confirmed multiple T2 hyperintense metastatic lesions throughout the right liver   Initiation of FOLFIRI/Avastin with genotype based irinotecan dosing per the [facility] study 04/05/2013.   Restaging CT evaluation on 06/20/2013 (after 2 cycles/4 treatments) showed improvement in the liver metastases and stable size of a left lower lobe pulmonary nodule now with central cavitation.  Continuation of FOLFIRI/Avastin.   Restaging CT evaluation 08/14/2013-decrease in the size of liver metastases, slight decrease in the size of a cavitary left lower lobe nodule, no evidence of disease progression.    Status post right hepatic lobectomy 09/17/2013. Pathology showed multiple foci of metastatic adenocarcinoma (5 nodules of metastatic adenocarcinoma 4 of which are subcapsular with the nodules ranging in size from 0.6-1.8 cm in greatest dimension). Margins not involved. Biopsy of a portal lymph node showed benign adipose tissue; no lymph node tissue or malignancy.   Normal CEA 11/06/2013   CT 11/06/2013 with a new pleural-based right lower chest lesion, slight in enlargement of the a left-sided lung nodule  CT 01/29/2014 with a decrease in the right lower chest pleural-based lesion and a slight increase of a left-sided lung lesion, other lung lesions were stable  CT chest 05/02/2014 with a decrease in the size of a right lower lobe pulmonary nodule months similar size of a dominant left lower lobe nodule, minimal enlargement of a smaller left lower lobe nodule, no new site of disease  CT chest 11/07/2014 with a slight increase in several left-sided lung nodules  CT chest 02/10/2015 with new small left hilar lymph nodes, a possible new left lower lobe nodule, and stability of other lung nodules  PET scan 03/12/2015 with hypermetabolic left hilar nodes, hypermetabolic left lower lobe nodules, hyper metabolic retroperitoneal nodes, intense hypermetabolism at the coloanal anastomosis, and hypermetabolic thickening in the presacral space  Cycle 1 FOLFIRI/PANITUMUMAB 05/21/2015  Cycle 2 FOLFIRI/PANITUMUMAB 06/05/2015  Cycle 3 FOLFIRI/PANITUMUMAB 06/19/2015  Cycle 4 FOLFIRI 07/10/2015  Cycle 5 FOLFIRI 07/24/2015  Restaging CTs 08/06/2015-resolution of hilar/retroperitoneal adenopathy, improvement in the hypermetabolic lung nodule, other lung nodules are stable, no new lesions  Cycle 6 FOLFIRI with PANITUMUMAB 08/28/2015 -panitumumab dose reduced  Cycle 7 FOLFIRI 09/11/2015-no panitumumab given  Cycle 8 FOLFIRI with PANITUMUMAB 09/25/2015-PANITUMUMAB dose reduced  Cycle 9 FOLFIRI  10/09/2015-no PANITUMUMAB given  Cycle 10 FOLFIRI with panitumumab 10/23/2015  CTs 11/06/2015-possible slight enlargement of a left lower lobe nodule, no other evidence of disease progression.  CTs 02/16/2016-enlargement of left-sided pulmonary nodules and mediastinal/left hilar nodes, improved splenomegaly  CTs 06/21/2016-increased size of mediastinal/left hilar nodes, increased left lung nodules, and increased soft tissue at the porta hepatis  CTs 10/28/2016-progressive disease in the chest and abdomen with an enlarging left hilar mass and slight interval enlargement of pulmonary lesions. Right upper quadrant necrotic nodal mass significantly increased in size with mass effect on the liver and possible invasion. Significant compression of the intrahepatic IVC. New right hepatic lobe lesion. Moderate pelvic ascites.  Cycle 1 FOLFIRI/PANITUMUMAB 11/04/2016  Cycle 2 FOLFIRI/PANITUMUMAB 11/18/2016  Cycle 3 FOLFIRI/panitumumab 12/02/2016  Cycle4 FOLFIRI/PANITUMUMAB 12/15/2016  Cycle 5 FOLFIRI/PANITUMUMAB 12/30/2016  CTs 01/06/2017-no evidence of progressive disease, decreased chest adenopathy, stable left lower lobe nodules, decreased liver metastasis, decreased right retroperitoneal mass  Cycle 6 FOLFIRI/panitumumab 01/13/2017  Cycle 7 FOLFIRI/Panitumumab 01/27/2017  Cycle 8 FOLFIRI/panitumumab 02/17/2017  Cycle 9 FOLFIRI/Panitumumab 03/03/2017  Cycle 10 FOLFIRI/Panitumumab 03/24/2017 2. History ofIrregular bowel habits/rectal bleeding secondary to #1. 3. History of Mild elevation of the liver enzymes. Question secondary to hepatic steatosis. 4. Indeterminate 8 mm posterior right liver lesion on the staging CT 03/06/2012. 5. Mildly elevated CEA at 5.7 on 03/06/2012. 6. History of radiation erythema at the groin and perineum 7. 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 No clinical evidence of  thrombophlebitis or extravasation. 8. Delayed nausea following chemotherapy-Decadron prophylaxis was  added with cycle 3 CAPOX-improved. 9. Ileostomy takedown 12/07/2012. 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 11. Port-A-Cath placement 12/31/204 12. Severe neutropenia secondary to chemotherapy following cycle 1 of FOLFIRI, chemotherapy was dose reduced and he received Neulasta with day 15 cycle 1  13. Nausea and vomiting following cycle 1 of FOLFIRI 14. Rectal stricture-manual/colonoscopic dilatation by Dr. Ardis Hughs 03/01/2016  APR 08/17/2016-no evidence of malignancy 15. History ofLeukopenia/Thrombocytopenia-persistent, potentially a sequelae of chemotherapy or hepatic toxicity from chemotherapy/radiation. Bone marrow biopsy 11/20/2014 showed cellular bone marrow with trilineage hematopoiesis. Significant dyspoiesis was not present and there was no evidence of metastatic carcinoma. Cytogenetic analysis showed the presence of normal male chromosomes with no observable clonal chromosomal abnormalities.  probable cirrhosis 16. Genetic testing-negative genetic panel in March 2014 17. 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 Severe over the face-steroid Dosepak prescribed 07/03/2015. Improved 07/10/2015. Further improved 07/24/2015, 08/07/2015, 08/28/2015 18. Diarrhea secondary to chemotherapy-encouraged to use Imodium  19. History of Paronychia secondary to PANITUMUMAB 20. 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 Ancheta appears stable.  He has completed 9 cycles of FOLFIRI/Panitumumab.  Plan to proceed with cycle 10 today as scheduled.  He will have restaging CTs prior to his next visit in 2 weeks.  He continues to have intermittent right mid back pain.  Etiology unclear.  Plan is for CTs as above.  He will try oxycodone for the pain.  He understands to contact the office with worsening pain or any new symptoms.  He will return for a  follow-up visit in 2 weeks.  He will contact the office in the interim as outlined above or with any other problems.  Plan reviewed with Dr. Benay Spice.  Ned Card ANP/GNP-BC   03/24/2017  11:17 AM

## 2017-03-24 NOTE — Telephone Encounter (Signed)
Scheduled apt per 1/3 los - Gave patient AVS and calender per los.

## 2017-03-24 NOTE — Progress Notes (Signed)
Okay to disconnect pump at 1330 03/26/17 per [place] per Dr. Benay Spice.

## 2017-03-24 NOTE — Patient Instructions (Signed)
[facility] Discharge Instructions for Patients Receiving Chemotherapy  Today you received the following chemotherapy agents: Irinotecan (Camtosar), Leucovorin, Fluorouracil (Adrucil, 5-FU), and Panitumumab (Vectibix).   To help prevent nausea and vomiting after your treatment, we encourage you to take your nausea medication as prescribed. Received Aloxi during treatment-->take Compazine for next 3 days for nausea.  If you develop nausea and vomiting that is not controlled by your nausea medication, call the clinic.   BELOW ARE SYMPTOMS THAT SHOULD BE REPORTED IMMEDIATELY:  *FEVER GREATER THAN 100.5 F  *CHILLS WITH OR WITHOUT FEVER  NAUSEA AND VOMITING THAT IS NOT CONTROLLED WITH YOUR NAUSEA MEDICATION  *UNUSUAL SHORTNESS OF BREATH  *UNUSUAL BRUISING OR BLEEDING  TENDERNESS IN MOUTH AND THROAT WITH OR WITHOUT PRESENCE OF ULCERS  *URINARY PROBLEMS  *BOWEL PROBLEMS  UNUSUAL RASH Items with * indicate a potential emergency and should be followed up as soon as possible.  Feel free to call the clinic should you have any questions or concerns. The clinic phone number is [phone].  Please show the [place] at check-in to the Emergency Department and triage nurse.

## 2017-03-26 ENCOUNTER — Ambulatory Visit: Payer: BC Managed Care – PPO

## 2017-03-26 ENCOUNTER — Ambulatory Visit (HOSPITAL_BASED_OUTPATIENT_CLINIC_OR_DEPARTMENT_OTHER): Payer: BC Managed Care – PPO

## 2017-03-26 VITALS — BP 115/77 | HR 97 | Temp 97.7°F | Resp 18

## 2017-03-26 DIAGNOSIS — C20 Malignant neoplasm of rectum: Secondary | ICD-10-CM

## 2017-03-26 DIAGNOSIS — Z5189 Encounter for other specified aftercare: Secondary | ICD-10-CM | POA: Diagnosis not present

## 2017-03-26 MED ORDER — SODIUM CHLORIDE 0.9 % IJ SOLN
10.0000 mL | INTRAMUSCULAR | Status: DC | PRN
  Administered 2017-03-26: 10 mL
  Filled 2017-03-26: qty 10

## 2017-03-26 MED ORDER — PEGFILGRASTIM INJECTION 6 MG/0.6ML ~~LOC~~
6.0000 mg | PREFILLED_SYRINGE | Freq: Once | SUBCUTANEOUS | Status: AC
  Administered 2017-03-26: 6 mg via SUBCUTANEOUS

## 2017-03-26 MED ORDER — HEPARIN SOD (PORK) LOCK FLUSH 100 UNIT/ML IV SOLN
500.0000 [IU] | Freq: Once | INTRAVENOUS | Status: AC | PRN
  Administered 2017-03-26: 500 [IU]
  Filled 2017-03-26: qty 5

## 2017-03-26 MED ORDER — PEGFILGRASTIM INJECTION 6 MG/0.6ML ~~LOC~~
PREFILLED_SYRINGE | SUBCUTANEOUS | Status: AC
  Filled 2017-03-26: qty 0.6

## 2017-04-03 ENCOUNTER — Other Ambulatory Visit: Payer: Self-pay | Admitting: Oncology

## 2017-04-05 ENCOUNTER — Encounter (HOSPITAL_COMMUNITY): Payer: Self-pay

## 2017-04-05 ENCOUNTER — Ambulatory Visit (HOSPITAL_COMMUNITY)
Admission: RE | Admit: 2017-04-05 | Discharge: 2017-04-05 | Disposition: A | Discharge status: Home / Self Care | Payer: BC Managed Care – PPO | Source: Ambulatory Visit | Attending: Nurse Practitioner | Admitting: Nurse Practitioner

## 2017-04-05 DIAGNOSIS — M5136 Other intervertebral disc degeneration, lumbar region: Secondary | ICD-10-CM | POA: Insufficient documentation

## 2017-04-05 DIAGNOSIS — J439 Emphysema, unspecified: Secondary | ICD-10-CM | POA: Diagnosis not present

## 2017-04-05 DIAGNOSIS — C787 Secondary malignant neoplasm of liver and intrahepatic bile duct: Secondary | ICD-10-CM | POA: Insufficient documentation

## 2017-04-05 DIAGNOSIS — R918 Other nonspecific abnormal finding of lung field: Secondary | ICD-10-CM | POA: Diagnosis not present

## 2017-04-05 DIAGNOSIS — C20 Malignant neoplasm of rectum: Secondary | ICD-10-CM | POA: Insufficient documentation

## 2017-04-05 MED ORDER — IOPAMIDOL (ISOVUE-300) INJECTION 61%
INTRAVENOUS | Status: AC
  Administered 2017-04-05: 100 mL via INTRAVENOUS
  Filled 2017-04-05: qty 100

## 2017-04-05 MED ORDER — IOPAMIDOL (ISOVUE-300) INJECTION 61%
100.0000 mL | Freq: Once | INTRAVENOUS | Status: AC | PRN
  Administered 2017-04-05: 100 mL via INTRAVENOUS

## 2017-04-07 ENCOUNTER — Inpatient Hospital Stay: Payer: BC Managed Care – PPO | Attending: Oncology | Admitting: Oncology

## 2017-04-07 ENCOUNTER — Inpatient Hospital Stay: Payer: BC Managed Care – PPO

## 2017-04-07 ENCOUNTER — Other Ambulatory Visit: Payer: Self-pay

## 2017-04-07 ENCOUNTER — Encounter: Payer: Self-pay | Admitting: Oncology

## 2017-04-07 VITALS — BP 127/87 | HR 78 | Temp 97.7°F | Resp 18 | Ht 70.0 in | Wt 139.4 lb

## 2017-04-07 DIAGNOSIS — K521 Toxic gastroenteritis and colitis: Secondary | ICD-10-CM | POA: Diagnosis not present

## 2017-04-07 DIAGNOSIS — C787 Secondary malignant neoplasm of liver and intrahepatic bile duct: Secondary | ICD-10-CM | POA: Insufficient documentation

## 2017-04-07 DIAGNOSIS — C20 Malignant neoplasm of rectum: Secondary | ICD-10-CM | POA: Diagnosis present

## 2017-04-07 DIAGNOSIS — R21 Rash and other nonspecific skin eruption: Secondary | ICD-10-CM | POA: Diagnosis not present

## 2017-04-07 DIAGNOSIS — Z5111 Encounter for antineoplastic chemotherapy: Secondary | ICD-10-CM | POA: Diagnosis present

## 2017-04-07 DIAGNOSIS — R490 Dysphonia: Secondary | ICD-10-CM | POA: Diagnosis not present

## 2017-04-07 DIAGNOSIS — M545 Low back pain: Secondary | ICD-10-CM | POA: Insufficient documentation

## 2017-04-07 DIAGNOSIS — Z95828 Presence of other vascular implants and grafts: Secondary | ICD-10-CM

## 2017-04-07 DIAGNOSIS — R911 Solitary pulmonary nodule: Secondary | ICD-10-CM

## 2017-04-07 DIAGNOSIS — Z5189 Encounter for other specified aftercare: Secondary | ICD-10-CM | POA: Insufficient documentation

## 2017-04-07 DIAGNOSIS — Z5112 Encounter for antineoplastic immunotherapy: Secondary | ICD-10-CM | POA: Diagnosis present

## 2017-04-07 DIAGNOSIS — E279 Disorder of adrenal gland, unspecified: Secondary | ICD-10-CM | POA: Diagnosis not present

## 2017-04-07 DIAGNOSIS — R110 Nausea: Secondary | ICD-10-CM

## 2017-04-07 LAB — CBC WITH DIFFERENTIAL (CANCER CENTER ONLY)
BASOS PCT: 0 %
Basophils Absolute: 0 10*3/uL (ref 0.0–0.1)
Eosinophils Absolute: 0.3 10*3/uL (ref 0.0–0.5)
Eosinophils Relative: 5 %
HEMATOCRIT: 37.8 % — AB (ref 38.4–49.9)
HEMOGLOBIN: 12 g/dL — AB (ref 13.0–17.1)
Lymphocytes Relative: 12 %
Lymphs Abs: 0.8 10*3/uL — ABNORMAL LOW (ref 0.9–3.3)
MCH: 27.6 pg (ref 27.2–33.4)
MCHC: 31.7 g/dL — AB (ref 32.0–36.0)
MCV: 87.1 fL (ref 79.3–98.0)
MONO ABS: 0.4 10*3/uL (ref 0.1–0.9)
MONOS PCT: 6 %
NEUTROS ABS: 4.8 10*3/uL (ref 1.5–6.5)
NEUTROS PCT: 77 %
Platelet Count: 146 10*3/uL (ref 140–400)
RBC: 4.34 MIL/uL (ref 4.20–5.82)
RDW: 17.7 % — AB (ref 11.0–15.6)
WBC Count: 6.3 10*3/uL (ref 4.0–10.3)

## 2017-04-07 LAB — CMP (CANCER CENTER ONLY)
ALBUMIN: 3.6 g/dL (ref 3.5–5.0)
ALK PHOS: 96 U/L (ref 40–150)
ALT: 18 U/L (ref 0–55)
ANION GAP: 9 (ref 3–11)
AST: 23 U/L (ref 5–34)
BUN: 11 mg/dL (ref 7–26)
CALCIUM: 9 mg/dL (ref 8.4–10.4)
CHLORIDE: 107 mmol/L (ref 98–109)
CO2: 25 mmol/L (ref 22–29)
CREATININE: 0.9 mg/dL (ref 0.70–1.30)
GFR, Est AFR Am: >60 mL/min (ref >60)
GFR, Estimated: >60 mL/min (ref >60)
GLUCOSE: 85 mg/dL (ref 70–140)
Potassium: 3.7 mmol/L (ref 3.5–5.1)
SODIUM: 141 mmol/L (ref 136–145)
Total Bilirubin: 0.2 mg/dL (ref 0.2–1.2)
Total Protein: 6.4 g/dL (ref 6.4–8.3)

## 2017-04-07 LAB — MAGNESIUM: MAGNESIUM: 2.4 mg/dL (ref 1.5–2.5)

## 2017-04-07 LAB — CEA (IN HOUSE-CHCC): CEA (CHCC-In House): 35.31 ng/mL — ABNORMAL HIGH (ref 0.00–5.00)

## 2017-04-07 MED ORDER — PALONOSETRON HCL INJECTION 0.25 MG/5ML
INTRAVENOUS | Status: AC
Start: 2017-04-07 — End: 2017-04-07
  Filled 2017-04-07: qty 5

## 2017-04-07 MED ORDER — LEUCOVORIN CALCIUM INJECTION 350 MG
400.0000 mg/m2 | Freq: Once | INTRAVENOUS | Status: AC
  Administered 2017-04-07: 716 mg via INTRAVENOUS
  Filled 2017-04-07: qty 35.8

## 2017-04-07 MED ORDER — SODIUM CHLORIDE 0.9 % IV SOLN
3.0000 mg/kg | Freq: Once | INTRAVENOUS | Status: AC
  Administered 2017-04-07: 200 mg via INTRAVENOUS
  Filled 2017-04-07: qty 10

## 2017-04-07 MED ORDER — SODIUM CHLORIDE 0.9 % IV SOLN
2400.0000 mg/m2 | INTRAVENOUS | Status: DC
  Administered 2017-04-07: 4300 mg via INTRAVENOUS
  Filled 2017-04-07: qty 86

## 2017-04-07 MED ORDER — ATROPINE SULFATE 1 MG/ML IJ SOLN: 0.5000 mg | Freq: Once | INTRAMUSCULAR | Status: DC | PRN

## 2017-04-07 MED ORDER — FOSAPREPITANT DIMEGLUMINE INJECTION 150 MG
Freq: Once | INTRAVENOUS | Status: AC
  Administered 2017-04-07: 15:00:00 via INTRAVENOUS
  Filled 2017-04-07: qty 5

## 2017-04-07 MED ORDER — PROCHLORPERAZINE EDISYLATE 5 MG/ML IJ SOLN
10.0000 mg | Freq: Once | INTRAMUSCULAR | Status: AC
  Administered 2017-04-07: 10 mg via INTRAVENOUS

## 2017-04-07 MED ORDER — PROCHLORPERAZINE EDISYLATE 5 MG/ML IJ SOLN
INTRAMUSCULAR | Status: AC
  Filled 2017-04-07: qty 2

## 2017-04-07 MED ORDER — SODIUM CHLORIDE 0.9 % IJ SOLN
10.0000 mL | INTRAMUSCULAR | Status: DC | PRN
  Administered 2017-04-07: 10 mL via INTRAVENOUS
  Filled 2017-04-07: qty 10

## 2017-04-07 MED ORDER — SODIUM CHLORIDE 0.9 % IV SOLN
Freq: Once | INTRAVENOUS | Status: AC
  Administered 2017-04-07: 15:00:00 via INTRAVENOUS

## 2017-04-07 MED ORDER — PALONOSETRON HCL INJECTION 0.25 MG/5ML
0.2500 mg | Freq: Once | INTRAVENOUS | Status: AC
  Administered 2017-04-07: 0.25 mg via INTRAVENOUS

## 2017-04-07 MED ORDER — IRINOTECAN HCL CHEMO INJECTION 100 MG/5ML
135.0000 mg/m2 | Freq: Once | INTRAVENOUS | Status: AC
  Administered 2017-04-07: 240 mg via INTRAVENOUS
  Filled 2017-04-07: qty 12

## 2017-04-07 NOTE — Patient Instructions (Signed)
[place] Discharge Instructions for Patients Receiving Chemotherapy  Today you received the following chemotherapy agents Vectibix; Irrinotecan; Leucovorin, Adrucil  To help prevent nausea and vomiting after your treatment, we encourage you to take your nausea medication as directed   If you develop nausea and vomiting that is not controlled by your nausea medication, call the clinic.   BELOW ARE SYMPTOMS THAT SHOULD BE REPORTED IMMEDIATELY:  *FEVER GREATER THAN 100.5 F  *CHILLS WITH OR WITHOUT FEVER  NAUSEA AND VOMITING THAT IS NOT CONTROLLED WITH YOUR NAUSEA MEDICATION  *UNUSUAL SHORTNESS OF BREATH  *UNUSUAL BRUISING OR BLEEDING  TENDERNESS IN MOUTH AND THROAT WITH OR WITHOUT PRESENCE OF ULCERS  *URINARY PROBLEMS  *BOWEL PROBLEMS  UNUSUAL RASH Items with * indicate a potential emergency and should be followed up as soon as possible.  Feel free to call the clinic should you have any questions or concerns. The clinic phone number is [phone].  Please show the [place] at check-in to the Emergency Department and triage nurse.

## 2017-04-07 NOTE — Progress Notes (Signed)
[place] OFFICE PROGRESS NOTE   Diagnosis: Rectal cancer  INTERVAL HISTORY:   Jacob [name] returns as scheduled.  He completed another cycle of FOLFIRI/Panitumumab 03/24/2017.  No nausea or mouth sores.  The skin rash has progressed.  He has constipation immediately following chemotherapy and then develops diarrhea over the next week.  He continues to have intermittent pain at the right mid back.  Objective:  Vital signs in last 24 hours:  Blood pressure 127/87, pulse 78, temperature 97.7 F (36.5 C), temperature source Oral, resp. rate 18, height _0  (1.778 m), weight 139 lb 6.4 oz (63.2 kg), SpO2 100 %.    HEENT: No thrush or ulcers Resp: Lungs clear bilaterally Cardio: Regular rate and rhythm GI: No hepatomegaly, left lower quadrant colostomy, no mass, nontender Vascular: No leg edema Musculoskeletal: No tenderness at the spine or right paraspinous region, no mass Skin: Acne type rash over the trunk  Portacath/PICC-without erythema  Lab Results:  Lab Results  Component Value Date   WBC 6.3 04/07/2017   HGB 11.3 (L) 03/24/2017   HCT 37.8 (L) 04/07/2017   MCV 87.1 04/07/2017   PLT 146 04/07/2017   NEUTROABS 4.8 04/07/2017    CMP     Component Value Date/Time   NA 141 04/07/2017 1220   NA 142 03/24/2017 1009   K 3.7 04/07/2017 1220   K 4.0 03/24/2017 1009   CL 107 04/07/2017 1220   CL 108 (H) 08/30/2012 0903   CO2 25 04/07/2017 1220   CO2 27 03/24/2017 1009   GLUCOSE 85 04/07/2017 1220   GLUCOSE 96 03/24/2017 1009   GLUCOSE 96 08/30/2012 0903   BUN 11 04/07/2017 1220   BUN 12.2 03/24/2017 1009   CREATININE 0.7 03/24/2017 1009   CALCIUM 9.0 04/07/2017 1220   CALCIUM 8.8 03/24/2017 1009   PROT 6.4 04/07/2017 1220   PROT 6.1 (L) 03/24/2017 1009   ALBUMIN 3.6 04/07/2017 1220   ALBUMIN 3.6 03/24/2017 1009   AST 23 04/07/2017 1220   AST 19 03/24/2017 1009   ALT 18 04/07/2017 1220   ALT 14 03/24/2017 1009   ALKPHOS 96 04/07/2017 1220   ALKPHOS 67 03/24/2017 1009   BILITOT 0.2 04/07/2017 1220   BILITOT <0.22 03/24/2017 1009   GFRNONAA >60 04/07/2017 1220   GFRAA >60 04/07/2017 1220    Lab Results  Component Value Date   CEA1 35.31 (H) 04/07/2017    Imaging:  Ct Chest W Contrast  Result Date: 04/06/2017 CLINICAL DATA:  Rectal cancer with metastatic disease to the liver. Prior distal colon resection with permanent colostomy and prior liver surgery. Right back pain. Cough and shortness of breath. Diarrhea. EXAM: CT CHEST, ABDOMEN, AND PELVIS WITH CONTRAST TECHNIQUE: Multidetector CT imaging of the chest, abdomen and pelvis was performed following the standard protocol during bolus administration of intravenous contrast. CONTRAST:  100 cc Isovue-300 COMPARISON:  01/06/2017 FINDINGS: CT CHEST FINDINGS Cardiovascular: Left Port-A-Cath tip: SVC. Mediastinum/Nodes: Tumor in the left AP window, left hilum, and left infrahilar region. AP window tumor measures 2.3 cm in thickness, stable compared to the prior exam. Soft tissue density posterior to the narrowed left lower lobe pulmonary artery measures 1.7 cm in thickness on image 26/2, stable. Left infrahilar node measures 1.0 cm in short axis on image 29/2, stable. Lungs/Pleura: Mild biapical pleuroparenchymal scarring. Mild paraseptal emphysema. Stable scarring, posterior basal segment right lower lobe. Slightly reduced thickness of the scarring in the left upper lobe. The more anterior left lower lobe nodule measures  2.0 by 1.2 cm on image 90/6, stable by my measurements. The more posterior left lower lobe pulmonary nodule measures 1.2 by 0.7 cm, likewise stable according to my measurements of current and prior nodule. No new nodule. Musculoskeletal: Unremarkable CT ABDOMEN PELVIS FINDINGS Hepatobiliary: Prior partial right hepatectomy. Reduce conspicuity of the lesion posteriorly in the dome of the right hepatic lobe, a residual lesion in this vicinity measures 1.2 by 0.8 cm on image 47/2  and abnormal enhancement in this region previously measured 2.1 by 2.0 cm. No new liver lesions seen. Pancreas: Unremarkable Spleen: The spleen measures 11.1 by 6.3 by 13.2 cm (volume = 480 cm^3) and demonstrates no appreciable focal abnormality. Adrenals/Urinary Tract: Inseparable from the right adrenal gland is a large 8.5 by by 4.6 cm mass on image 54/2 (previously 8.0 by 3.5 cm) with some evidence of central necrosis. The mass has enlarged compared to 01/06/2017 although is still smaller than it was on 10/28/2016. This mass is inseparable from the crus of the right hemidiaphragm and abuts the posterior liver margin. There is some associated narrowing adjacent infra hepatic IVC due to direct mass effect. The kidneys appear unremarkable. Stomach/Bowel: Left colostomy with some mild but reduced stranding in the adipose tissues surrounding the stoma. Twisting of the terminal ileum on images 84-73 of series 4 without obstruction. Vascular/Lymphatic: Further reduction in conspicuity of the prior small focal thrombosis at the junction of the SMV and splenic vein into the portal vein. This was previously much more parent for example on the 10/28/2016 exam. Small retroperitoneal lymph nodes are not pathologically enlarged by size criteria. Reproductive: Mildly heterogeneous prostate gland with indistinct seminal vesicles. Some of this may be related to prior therapy in this region. Other: Rectal resection with presacral soft tissue density again noted. Reduced stranding in the original perirectal stretch that reduced stranding and nodularity in the original perirectal space and above the seminal vesicles. There a bandlike density in the right presacral/perirectal space but which may well be due to scarring. The stranding and nodularity in this region has not completely resolved. Musculoskeletal: Degenerative disc disease at L4-5 and L5-S1. 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 IMPRESSION: 1. The right  adrenal mass has enlarged compared to 01/06/2017, but is still smaller than it was on 10/28/2016. This causes mass effect on the adjacent IVC, without definite current thrombosis. Stable tumor in the AP window extending into the left hilar and infrahilar regions. 2. Stable left lower lobe pulmonary nodules. 3. Reduced stranding and slight reduction in nodularity in the presacral/original perirectal space. There is still some residual stranding and nodularity in this region likely mostly post therapy related, will merit surveillance. 4. Mild stranding in the adipose tissue brought out with the ostomy, although improved from prior. 5. Further reduction in conspicuity of the prior small focal thrombosis at the junction of the SMV and splenic vein, now essentially resolved. 6. Other imaging findings of potential clinical significance: Mild paraseptal emphysema. Partial right hepatectomy. Mild splenomegaly. Lower lumbar degenerative disc disease. 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 Electronically Signed   By: Van Clines M.D.   On: 04/06/2017 09:35   Ct Abdomen Pelvis W Contrast  Result Date: 04/06/2017 CLINICAL DATA:  Rectal cancer with metastatic disease to the liver. Prior distal colon resection with permanent colostomy and prior liver surgery. Right back pain. Cough and shortness of breath. Diarrhea. EXAM: CT CHEST, ABDOMEN, AND PELVIS WITH CONTRAST TECHNIQUE: Multidetector CT imaging of the chest, abdomen and pelvis  was performed following the standard protocol during bolus administration of intravenous contrast. CONTRAST:  100 cc Isovue-300 COMPARISON:  01/06/2017 FINDINGS: CT CHEST FINDINGS Cardiovascular: Left Port-A-Cath tip: SVC. Mediastinum/Nodes: Tumor in the left AP window, left hilum, and left infrahilar region. AP window tumor measures 2.3 cm in thickness, stable compared to the prior exam. Soft tissue density posterior to the narrowed left lower lobe pulmonary artery measures 1.7 cm  in thickness on image 26/2, stable. Left infrahilar node measures 1.0 cm in short axis on image 29/2, stable. Lungs/Pleura: Mild biapical pleuroparenchymal scarring. Mild paraseptal emphysema. Stable scarring, posterior basal segment right lower lobe. Slightly reduced thickness of the scarring in the left upper lobe. The more anterior left lower lobe nodule measures 2.0 by 1.2 cm on image 90/6, stable by my measurements. The more posterior left lower lobe pulmonary nodule measures 1.2 by 0.7 cm, likewise stable according to my measurements of current and prior nodule. No new nodule. Musculoskeletal: Unremarkable CT ABDOMEN PELVIS FINDINGS Hepatobiliary: Prior partial right hepatectomy. Reduce conspicuity of the lesion posteriorly in the dome of the right hepatic lobe, a residual lesion in this vicinity measures 1.2 by 0.8 cm on image 47/2 and abnormal enhancement in this region previously measured 2.1 by 2.0 cm. No new liver lesions seen. Pancreas: Unremarkable Spleen: The spleen measures 11.1 by 6.3 by 13.2 cm (volume = 480 cm^3) and demonstrates no appreciable focal abnormality. Adrenals/Urinary Tract: Inseparable from the right adrenal gland is a large 8.5 by by 4.6 cm mass on image 54/2 (previously 8.0 by 3.5 cm) with some evidence of central necrosis. The mass has enlarged compared to 01/06/2017 although is still smaller than it was on 10/28/2016. This mass is inseparable from the crus of the right hemidiaphragm and abuts the posterior liver margin. There is some associated narrowing adjacent infra hepatic IVC due to direct mass effect. The kidneys appear unremarkable. Stomach/Bowel: Left colostomy with some mild but reduced stranding in the adipose tissues surrounding the stoma. Twisting of the terminal ileum on images 84-73 of series 4 without obstruction. Vascular/Lymphatic: Further reduction in conspicuity of the prior small focal thrombosis at the junction of the SMV and splenic vein into the portal vein.  This was previously much more parent for example on the 10/28/2016 exam. Small retroperitoneal lymph nodes are not pathologically enlarged by size criteria. Reproductive: Mildly heterogeneous prostate gland with indistinct seminal vesicles. Some of this may be related to prior therapy in this region. Other: Rectal resection with presacral soft tissue density again noted. Reduced stranding in the original perirectal stretch that reduced stranding and nodularity in the original perirectal space and above the seminal vesicles. There a bandlike density in the right presacral/perirectal space but which may well be due to scarring. The stranding and nodularity in this region has not completely resolved. Musculoskeletal: Degenerative disc disease at L4-5 and L5-S1. 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 IMPRESSION: 1. The right adrenal mass has enlarged compared to 01/06/2017, but is still smaller than it was on 10/28/2016. This causes mass effect on the adjacent IVC, without definite current thrombosis. Stable tumor in the AP window extending into the left hilar and infrahilar regions. 2. Stable left lower lobe pulmonary nodules. 3. Reduced stranding and slight reduction in nodularity in the presacral/original perirectal space. There is still some residual stranding and nodularity in this region likely mostly post therapy related, will merit surveillance. 4. Mild stranding in the adipose tissue brought out with the ostomy, although improved from prior.  5. Further reduction in conspicuity of the prior small focal thrombosis at the junction of the SMV and splenic vein, now essentially resolved. 6. Other imaging findings of potential clinical significance: Mild paraseptal emphysema. Partial right hepatectomy. Mild splenomegaly. Lower lumbar degenerative disc disease. 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 Electronically Signed   By: Van Clines M.D.   On: 04/06/2017 09:35    Medications: I  have reviewed the patient's current medications.   Assessment/Plan:  1. Rectal cancer. Partially obstructing mass noted 1-2 cm from the anal verge on a colonoscopy 03/06/2012. Endoscopic ultrasound 03/14/2012 with a 7.5 mm thick, 3.2 cm wide hypoechoic, irregularly bordered mass that clearly passed into and through the muscularis propria layer of the distal rectal wall (uT3); 3 small (largest 7 mm) perirectal lymph nodes. The lymph nodes were all round, discrete, hypoechoic, homogenous; suspicious for malignant involvement (uN1).  No RAS mutation identified by [facility] 1 testing on the colon resection specimen 06/29/2012, APC alteration identified, microsatellite stable  He began radiation and concurrent Xeloda chemotherapy on 03/20/2012, completed 04/27/2012.   Low anterior resection/coloanal anastomosis and diverting ileostomy 06/29/2012 with the final pathology revealing a T2N0 tumor with extensive fibrosis and negative margins.   Cycle 1 of adjuvant CAPOX 07/19/2012. Cycle 5 of adjuvant CAPOX 10/11/2012.   CEA 2.5 on 11/14/2012.   CEA 14.6 03/08/2013.   Restaging CT evaluation 03/08/2013 with a 4 mm pulmonary nodule left lung base not identified on comparison exam; new liver lesions including a 29 x 26 mm irregular peripheral enhancing rounded lesion in the dome of the right hepatic lobe, a less well-defined new subcapsular lesion in the lateral right hepatic lobe measuring 12 mm, a new subcapsular lesion in the anterior right hepatic lobe adjacent to the gallbladder fossa measuring 10 mm; a rounded low-density lesion in the inferior right hepatic lobe measuring 10 mm compared to 7 mm on the prior study (radiologist commented this may represent an enlarged cyst).   MRI of the abdomen 04/12/2013 confirmed multiple T2 hyperintense metastatic lesions throughout the right liver   Initiation of FOLFIRI/Avastin with genotype based irinotecan dosing per the [facility] study 04/05/2013.    Restaging CT evaluation on 06/20/2013 (after 2 cycles/4 treatments) showed improvement in the liver metastases and stable size of a left lower lobe pulmonary nodule now with central cavitation.   Continuation of FOLFIRI/Avastin.   Restaging CT evaluation 08/14/2013-decrease in the size of liver metastases, slight decrease in the size of a cavitary left lower lobe nodule, no evidence of disease progression.   Status post right hepatic lobectomy 09/17/2013. Pathology showed multiple foci of metastatic adenocarcinoma (5 nodules of metastatic adenocarcinoma 4 of which are subcapsular with the nodules ranging in size from 0.6-1.8 cm in greatest dimension). Margins not involved. Biopsy of a portal lymph node showed benign adipose tissue; no lymph node tissue or malignancy.   Normal CEA 11/06/2013   CT 11/06/2013 with a new pleural-based right lower chest lesion, slight in enlargement of the a left-sided lung nodule  CT 01/29/2014 with a decrease in the right lower chest pleural-based lesion and a slight increase of a left-sided lung lesion, other lung lesions were stable  CT chest 05/02/2014 with a decrease in the size of a right lower lobe pulmonary nodule months similar size of a dominant left lower lobe nodule, minimal enlargement of a smaller left lower lobe nodule, no new site of disease  CT chest 11/07/2014 with a slight increase in several left-sided lung nodules  CT chest  02/10/2015 with new small left hilar lymph nodes, a possible new left lower lobe nodule, and stability of other lung nodules  PET scan 03/12/2015 with hypermetabolic left hilar nodes, hypermetabolic left lower lobe nodules, hyper metabolic retroperitoneal nodes, intense hypermetabolism at the coloanal anastomosis, and hypermetabolic thickening in the presacral space  Cycle 1 FOLFIRI/PANITUMUMAB 05/21/2015  Cycle 2 FOLFIRI/PANITUMUMAB 06/05/2015  Cycle 3 FOLFIRI/PANITUMUMAB 06/19/2015  Cycle 4 FOLFIRI  07/10/2015  Cycle 5 FOLFIRI 07/24/2015  Restaging CTs 08/06/2015-resolution of hilar/retroperitoneal adenopathy, improvement in the hypermetabolic lung nodule, other lung nodules are stable, no new lesions  Cycle 6 FOLFIRI with PANITUMUMAB 08/28/2015 -panitumumab dose reduced  Cycle 7 FOLFIRI 09/11/2015-no panitumumab given  Cycle 8 FOLFIRI with PANITUMUMAB 09/25/2015-PANITUMUMAB dose reduced  Cycle 9 FOLFIRI 10/09/2015-no PANITUMUMAB given  Cycle 10 FOLFIRI with panitumumab 10/23/2015  CTs 11/06/2015-possible slight enlargement of a left lower lobe nodule, no other evidence of disease progression.  CTs 02/16/2016-enlargement of left-sided pulmonary nodules and mediastinal/left hilar nodes, improved splenomegaly  CTs 06/21/2016-increased size of mediastinal/left hilar nodes, increased left lung nodules, and increased soft tissue at the porta hepatis  CTs 10/28/2016-progressive disease in the chest and abdomen with an enlarging left hilar mass and slight interval enlargement of pulmonary lesions. Right upper quadrant necrotic nodal mass significantly increased in size with mass effect on the liver and possible invasion. Significant compression of the intrahepatic IVC. New right hepatic lobe lesion. Moderate pelvic ascites.  Cycle 1 FOLFIRI/PANITUMUMAB 11/04/2016  Cycle 2 FOLFIRI/PANITUMUMAB 11/18/2016  Cycle 3 FOLFIRI/panitumumab 12/02/2016  Cycle4 FOLFIRI/PANITUMUMAB 12/15/2016  Cycle 5 FOLFIRI/PANITUMUMAB 12/30/2016  CTs 01/06/2017-no evidence of progressive disease, decreased chest adenopathy, stable left lower lobe nodules, decreased liver metastasis, decreased right retroperitoneal mass  Cycle 6 FOLFIRI/panitumumab 01/13/2017  Cycle 7 FOLFIRI/Panitumumab 01/27/2017  Cycle 8 FOLFIRI/panitumumab 02/17/2017  Cycle 9 FOLFIRI/Panitumumab 03/03/2017  Cycle 10 FOLFIRI/Panitumumab 03/24/2017  CTs 04/06/2017-enlargement of right adrenal mass (smaller than October 2019), stable  left hilar fullness and lung nodules  Cycle 11 FOLFIRI/Panitumumab 04/07/2017 2. History ofIrregular bowel habits/rectal bleeding secondary to #1. 3. History of Mild elevation of the liver enzymes. Question secondary to hepatic steatosis. 4. Indeterminate 8 mm posterior right liver lesion on the staging CT 03/06/2012. 5. Mildly elevated CEA at 5.7 on 03/06/2012. 6. History of radiation erythema at the groin and perineum 7. 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 No clinical evidence of thrombophlebitis or extravasation. 8. Delayed nausea following chemotherapy-Decadron prophylaxis was added with cycle 3 CAPOX-improved. 9. Ileostomy takedown 12/07/2012. 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 11. Port-A-Cath placement 12/31/204 12. Severe neutropenia secondary to chemotherapy following cycle 1 of FOLFIRI, chemotherapy was dose reduced and he received Neulasta with day 15 cycle 1  13. Nausea and vomiting following cycle 1 of FOLFIRI 14. Rectal stricture-manual/colonoscopic dilatation by Dr. Ardis Hughs 03/01/2016  APR 08/17/2016-no evidence of malignancy 15. History ofLeukopenia/Thrombocytopenia-persistent, potentially a sequelae of chemotherapy or hepatic toxicity from chemotherapy/radiation. Bone marrow biopsy 11/20/2014 showed cellular bone marrow with trilineage hematopoiesis. Significant dyspoiesis was not present and there was no evidence of metastatic carcinoma. Cytogenetic analysis showed the presence of normal male chromosomes with no observable clonal chromosomal abnormalities.  probable cirrhosis 16. Genetic testing-negative genetic panel in March 2014 17. 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 Severe over the face-steroid Dosepak prescribed 07/03/2015. Improved 07/10/2015. Further improved 07/24/2015, 08/07/2015, 08/28/2015 18. Diarrhea secondary to chemotherapy-encouraged to use Imodium  19. History of  Paronychia secondary to PANITUMUMAB 20. 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  I reviewed the restaging CTs with him.  The right adrenal mass is larger, but there was no other evidence of disease progression.  The CEA is lower today.  We discussed treatment options including continuing the current treatment, dose escalating the Panitumumab, and switching to an oxaliplatin-based regimen.  We also discussed referring him to [facility] to consider clinical trial options.  We decided to continue FOLFIRI/Avastin for now.  He will complete another cycle today and return for an office visit and chemotherapy in 2 weeks.  He requests an ENT referral to evaluate the hoarseness.  The right mid back pain may be related to the adrenal mass, though the pain is intermittent and positional.  30 minutes were spent with the patient today.  The majority of the time was used for counseling and coordination of care.  Betsy Coder, MD  04/07/2017  3:54 PM

## 2017-04-07 NOTE — Progress Notes (Signed)
Dr. Benay Spice made aware of patient's complaints of "not feeling well" with nausea; orders give. Per Dr. Benay Spice, okay to stop pump at 43 hours and waste remaining medication.

## 2017-04-09 ENCOUNTER — Inpatient Hospital Stay: Payer: BC Managed Care – PPO

## 2017-04-09 VITALS — BP 112/81 | HR 98 | Temp 97.7°F | Resp 18

## 2017-04-09 DIAGNOSIS — C20 Malignant neoplasm of rectum: Secondary | ICD-10-CM | POA: Diagnosis not present

## 2017-04-09 DIAGNOSIS — C787 Secondary malignant neoplasm of liver and intrahepatic bile duct: Principal | ICD-10-CM

## 2017-04-09 MED ORDER — PEGFILGRASTIM INJECTION 6 MG/0.6ML ~~LOC~~
6.0000 mg | PREFILLED_SYRINGE | Freq: Once | SUBCUTANEOUS | Status: AC
  Administered 2017-04-09: 6 mg via SUBCUTANEOUS

## 2017-04-09 MED ORDER — HEPARIN SOD (PORK) LOCK FLUSH 100 UNIT/ML IV SOLN
500.0000 [IU] | Freq: Once | INTRAVENOUS | Status: AC | PRN
  Administered 2017-04-09: 500 [IU]
  Filled 2017-04-09: qty 5

## 2017-04-09 MED ORDER — PEGFILGRASTIM INJECTION 6 MG/0.6ML ~~LOC~~
PREFILLED_SYRINGE | SUBCUTANEOUS | Status: AC
  Filled 2017-04-09: qty 0.6

## 2017-04-09 MED ORDER — SODIUM CHLORIDE 0.9 % IJ SOLN
10.0000 mL | INTRAMUSCULAR | Status: DC | PRN
  Administered 2017-04-09: 10 mL
  Filled 2017-04-09: qty 10

## 2017-04-11 ENCOUNTER — Telehealth: Payer: Self-pay | Admitting: Oncology

## 2017-04-11 NOTE — Telephone Encounter (Signed)
Left message re January/February appointments. Patient to get updated scheduled at 1/31 visit.

## 2017-04-17 ENCOUNTER — Other Ambulatory Visit: Payer: Self-pay | Admitting: Oncology

## 2017-04-21 ENCOUNTER — Inpatient Hospital Stay: Payer: BC Managed Care – PPO

## 2017-04-21 ENCOUNTER — Encounter: Payer: Self-pay | Admitting: Oncology

## 2017-04-21 ENCOUNTER — Inpatient Hospital Stay (HOSPITAL_BASED_OUTPATIENT_CLINIC_OR_DEPARTMENT_OTHER): Payer: BC Managed Care – PPO | Admitting: Oncology

## 2017-04-21 VITALS — BP 122/84 | HR 80 | Temp 97.9°F | Resp 17 | Ht 70.0 in | Wt 142.8 lb

## 2017-04-21 DIAGNOSIS — C20 Malignant neoplasm of rectum: Secondary | ICD-10-CM

## 2017-04-21 DIAGNOSIS — Z95828 Presence of other vascular implants and grafts: Secondary | ICD-10-CM

## 2017-04-21 DIAGNOSIS — M545 Low back pain: Secondary | ICD-10-CM | POA: Diagnosis not present

## 2017-04-21 DIAGNOSIS — C787 Secondary malignant neoplasm of liver and intrahepatic bile duct: Secondary | ICD-10-CM | POA: Diagnosis not present

## 2017-04-21 DIAGNOSIS — E279 Disorder of adrenal gland, unspecified: Secondary | ICD-10-CM | POA: Diagnosis not present

## 2017-04-21 DIAGNOSIS — R21 Rash and other nonspecific skin eruption: Secondary | ICD-10-CM

## 2017-04-21 DIAGNOSIS — R490 Dysphonia: Secondary | ICD-10-CM

## 2017-04-21 DIAGNOSIS — R911 Solitary pulmonary nodule: Secondary | ICD-10-CM | POA: Diagnosis not present

## 2017-04-21 DIAGNOSIS — K521 Toxic gastroenteritis and colitis: Secondary | ICD-10-CM | POA: Diagnosis not present

## 2017-04-21 LAB — CBC WITH DIFFERENTIAL/PLATELET
Basophils Absolute: 0 10*3/uL (ref 0.0–0.1)
Basophils Relative: 0 %
EOS PCT: 3 %
Eosinophils Absolute: 0.1 10*3/uL (ref 0.0–0.5)
HCT: 37.9 % — ABNORMAL LOW (ref 38.4–49.9)
Hemoglobin: 11.9 g/dL — ABNORMAL LOW (ref 13.0–17.1)
LYMPHS ABS: 0.6 10*3/uL — AB (ref 0.9–3.3)
Lymphocytes Relative: 11 %
MCH: 27.4 pg (ref 27.2–33.4)
MCHC: 31.4 g/dL — AB (ref 32.0–36.0)
MCV: 87.3 fL (ref 79.3–98.0)
MONOS PCT: 9 %
Monocytes Absolute: 0.5 10*3/uL (ref 0.1–0.9)
Neutro Abs: 4.4 10*3/uL (ref 1.5–6.5)
Neutrophils Relative %: 77 %
PLATELETS: 167 10*3/uL (ref 140–400)
RBC: 4.34 MIL/uL (ref 4.20–5.82)
RDW: 18.5 % — ABNORMAL HIGH (ref 11.0–14.6)
WBC: 5.6 10*3/uL (ref 4.0–10.3)

## 2017-04-21 LAB — COMPREHENSIVE METABOLIC PANEL
ALT: 14 U/L (ref 0–55)
AST: 22 U/L (ref 5–34)
Albumin: 3.6 g/dL (ref 3.5–5.0)
Alkaline Phosphatase: 101 U/L (ref 40–150)
Anion gap: 8 (ref 3–11)
BUN: 10 mg/dL (ref 7–26)
CHLORIDE: 106 mmol/L (ref 98–109)
CO2: 27 mmol/L (ref 22–29)
CREATININE: 0.89 mg/dL (ref 0.70–1.30)
Calcium: 8.7 mg/dL (ref 8.4–10.4)
GFR calc Af Amer: >60 mL/min (ref >60)
GFR calc non Af Amer: >60 mL/min (ref >60)
GLUCOSE: 105 mg/dL (ref 70–140)
Potassium: 3.7 mmol/L (ref 3.5–5.1)
SODIUM: 141 mmol/L (ref 136–145)
Total Bilirubin: 0.2 mg/dL (ref 0.2–1.2)
Total Protein: 6.1 g/dL — ABNORMAL LOW (ref 6.4–8.3)

## 2017-04-21 LAB — MAGNESIUM: MAGNESIUM: 2.3 mg/dL (ref 1.5–2.5)

## 2017-04-21 LAB — CEA (IN HOUSE-CHCC): CEA (CHCC-In House): 36.25 ng/mL — ABNORMAL HIGH (ref 0.00–5.00)

## 2017-04-21 MED ORDER — HEPARIN SOD (PORK) LOCK FLUSH 100 UNIT/ML IV SOLN
500.0000 [IU] | Freq: Once | INTRAVENOUS | Status: DC | PRN
Start: 2017-04-21 — End: 2017-04-21
  Filled 2017-04-21: qty 5

## 2017-04-21 MED ORDER — SODIUM CHLORIDE 0.9 % IV SOLN
2400.0000 mg/m2 | INTRAVENOUS | Status: DC
  Administered 2017-04-21: 4300 mg via INTRAVENOUS
  Filled 2017-04-21: qty 86

## 2017-04-21 MED ORDER — SODIUM CHLORIDE 0.9 % IV SOLN
3.0000 mg/kg | Freq: Once | INTRAVENOUS | Status: AC
  Administered 2017-04-21: 200 mg via INTRAVENOUS
  Filled 2017-04-21: qty 10

## 2017-04-21 MED ORDER — SODIUM CHLORIDE 0.9 % IJ SOLN
10.0000 mL | INTRAMUSCULAR | Status: DC | PRN
  Filled 2017-04-21: qty 10

## 2017-04-21 MED ORDER — SODIUM CHLORIDE 0.9 % IV SOLN: Freq: Once | INTRAVENOUS | Status: DC

## 2017-04-21 MED ORDER — SODIUM CHLORIDE 0.9 % IV SOLN
Freq: Once | INTRAVENOUS | Status: AC
  Administered 2017-04-21: 11:00:00 via INTRAVENOUS
  Filled 2017-04-21: qty 5

## 2017-04-21 MED ORDER — PALONOSETRON HCL INJECTION 0.25 MG/5ML
0.2500 mg | Freq: Once | INTRAVENOUS | Status: AC
  Administered 2017-04-21: 0.25 mg via INTRAVENOUS

## 2017-04-21 MED ORDER — SODIUM CHLORIDE 0.9 % IJ SOLN
10.0000 mL | INTRAMUSCULAR | Status: DC | PRN
  Administered 2017-04-21: 10 mL via INTRAVENOUS
  Filled 2017-04-21: qty 10

## 2017-04-21 MED ORDER — LEUCOVORIN CALCIUM INJECTION 350 MG
400.0000 mg/m2 | Freq: Once | INTRAVENOUS | Status: AC
  Administered 2017-04-21: 716 mg via INTRAVENOUS
  Filled 2017-04-21: qty 35.8

## 2017-04-21 MED ORDER — ALPRAZOLAM 0.25 MG PO TABS: 0.2500 mg | ORAL_TABLET | Freq: Every evening | ORAL | 1 refills | Status: DC | PRN

## 2017-04-21 MED ORDER — IRINOTECAN HCL CHEMO INJECTION 100 MG/5ML
135.0000 mg/m2 | Freq: Once | INTRAVENOUS | Status: AC
  Administered 2017-04-21: 240 mg via INTRAVENOUS
  Filled 2017-04-21: qty 12

## 2017-04-21 MED ORDER — PALONOSETRON HCL INJECTION 0.25 MG/5ML
INTRAVENOUS | Status: AC
  Filled 2017-04-21: qty 5

## 2017-04-21 NOTE — Progress Notes (Signed)
Called in xanax refill to patient pharmacy

## 2017-04-21 NOTE — Progress Notes (Signed)
[facility] OFFICE PROGRESS NOTE   Diagnosis: Rectal cancer  INTERVAL HISTORY:   Jacob [name] completed another cycle of FOLFIRI/panitumumab 04/07/2017.  He had diarrhea following chemotherapy.  He continues to have intermittent right-sided back pain.  He vomited after having pain last night.  No vomiting in general.  Good appetite.  The rash was worse several days ago and has improved.  Objective:  Vital signs in last 24 hours:  There were no vitals taken for this visit.    HEENT: No thrush or ulcers Resp: Lungs clear bilaterally Cardio: Regular rate and rhythm GI: No hepatomegaly, left lower quadrant colostomy, nontender Vascular: No leg edema  Skin: Acne type rash over the trunk  Portacath/PICC-without erythema  Lab Results:  Lab Results  Component Value Date   WBC 6.3 04/07/2017   HGB 11.3 (L) 03/24/2017   HCT 37.8 (L) 04/07/2017   MCV 87.1 04/07/2017   PLT 146 04/07/2017   NEUTROABS 4.8 04/07/2017    CMP     Component Value Date/Time   NA 141 04/07/2017 1220   NA 142 03/24/2017 1009   K 3.7 04/07/2017 1220   K 4.0 03/24/2017 1009   CL 107 04/07/2017 1220   CL 108 (H) 08/30/2012 0903   CO2 25 04/07/2017 1220   CO2 27 03/24/2017 1009   GLUCOSE 85 04/07/2017 1220   GLUCOSE 96 03/24/2017 1009   GLUCOSE 96 08/30/2012 0903   BUN 11 04/07/2017 1220   BUN 12.2 03/24/2017 1009   CREATININE 0.7 03/24/2017 1009   CALCIUM 9.0 04/07/2017 1220   CALCIUM 8.8 03/24/2017 1009   PROT 6.4 04/07/2017 1220   PROT 6.1 (L) 03/24/2017 1009   ALBUMIN 3.6 04/07/2017 1220   ALBUMIN 3.6 03/24/2017 1009   AST 23 04/07/2017 1220   AST 19 03/24/2017 1009   ALT 18 04/07/2017 1220   ALT 14 03/24/2017 1009   ALKPHOS 96 04/07/2017 1220   ALKPHOS 67 03/24/2017 1009   BILITOT 0.2 04/07/2017 1220   BILITOT <0.22 03/24/2017 1009   GFRNONAA >60 04/07/2017 1220   GFRAA >60 04/07/2017 1220    Lab Results  Component Value Date   CEA1 35.31 (H) 04/07/2017     Medications: I have reviewed the patient's current medications.   Assessment/Plan: 1. Rectal cancer. Partially obstructing mass noted 1-2 cm from the anal verge on a colonoscopy 03/06/2012. Endoscopic ultrasound 03/14/2012 with a 7.5 mm thick, 3.2 cm wide hypoechoic, irregularly bordered mass that clearly passed into and through the muscularis propria layer of the distal rectal wall (uT3); 3 small (largest 7 mm) perirectal lymph nodes. The lymph nodes were all round, discrete, hypoechoic, homogenous; suspicious for malignant involvement (uN1).  No RAS mutation identified by [facility] 1 testing on the colon resection specimen 06/29/2012, APC alteration identified, microsatellite stable  He began radiation and concurrent Xeloda chemotherapy on 03/20/2012, completed 04/27/2012.   Low anterior resection/coloanal anastomosis and diverting ileostomy 06/29/2012 with the final pathology revealing a T2N0 tumor with extensive fibrosis and negative margins.   Cycle 1 of adjuvant CAPOX 07/19/2012. Cycle 5 of adjuvant CAPOX 10/11/2012.   CEA 2.5 on 11/14/2012.   CEA 14.6 03/08/2013.   Restaging CT evaluation 03/08/2013 with a 4 mm pulmonary nodule left lung base not identified on comparison exam; new liver lesions including a 29 x 26 mm irregular peripheral enhancing rounded lesion in the dome of the right hepatic lobe, a less well-defined new subcapsular lesion in the lateral right hepatic lobe measuring 12 mm, a new  subcapsular lesion in the anterior right hepatic lobe adjacent to the gallbladder fossa measuring 10 mm; a rounded low-density lesion in the inferior right hepatic lobe measuring 10 mm compared to 7 mm on the prior study (radiologist commented this may represent an enlarged cyst).   MRI of the abdomen 04/12/2013 confirmed multiple T2 hyperintense metastatic lesions throughout the right liver   Initiation of FOLFIRI/Avastin with genotype based irinotecan dosing per the [facility] study  04/05/2013.   Restaging CT evaluation on 06/20/2013 (after 2 cycles/4 treatments) showed improvement in the liver metastases and stable size of a left lower lobe pulmonary nodule now with central cavitation.   Continuation of FOLFIRI/Avastin.   Restaging CT evaluation 08/14/2013-decrease in the size of liver metastases, slight decrease in the size of a cavitary left lower lobe nodule, no evidence of disease progression.   Status post right hepatic lobectomy 09/17/2013. Pathology showed multiple foci of metastatic adenocarcinoma (5 nodules of metastatic adenocarcinoma 4 of which are subcapsular with the nodules ranging in size from 0.6-1.8 cm in greatest dimension). Margins not involved. Biopsy of a portal lymph node showed benign adipose tissue; no lymph node tissue or malignancy.   Normal CEA 11/06/2013   CT 11/06/2013 with a new pleural-based right lower chest lesion, slight in enlargement of the a left-sided lung nodule  CT 01/29/2014 with a decrease in the right lower chest pleural-based lesion and a slight increase of a left-sided lung lesion, other lung lesions were stable  CT chest 05/02/2014 with a decrease in the size of a right lower lobe pulmonary nodule months similar size of a dominant left lower lobe nodule, minimal enlargement of a smaller left lower lobe nodule, no new site of disease  CT chest 11/07/2014 with a slight increase in several left-sided lung nodules  CT chest 02/10/2015 with new small left hilar lymph nodes, a possible new left lower lobe nodule, and stability of other lung nodules  PET scan 03/12/2015 with hypermetabolic left hilar nodes, hypermetabolic left lower lobe nodules, hyper metabolic retroperitoneal nodes, intense hypermetabolism at the coloanal anastomosis, and hypermetabolic thickening in the presacral space  Cycle 1 FOLFIRI/PANITUMUMAB 05/21/2015  Cycle 2 FOLFIRI/PANITUMUMAB 06/05/2015  Cycle 3 FOLFIRI/PANITUMUMAB 06/19/2015  Cycle 4  FOLFIRI 07/10/2015  Cycle 5 FOLFIRI 07/24/2015  Restaging CTs 08/06/2015-resolution of hilar/retroperitoneal adenopathy, improvement in the hypermetabolic lung nodule, other lung nodules are stable, no new lesions  Cycle 6 FOLFIRI with PANITUMUMAB 08/28/2015 -panitumumab dose reduced  Cycle 7 FOLFIRI 09/11/2015-no panitumumab given  Cycle 8 FOLFIRI with PANITUMUMAB 09/25/2015-PANITUMUMAB dose reduced  Cycle 9 FOLFIRI 10/09/2015-no PANITUMUMAB given  Cycle 10 FOLFIRI with panitumumab 10/23/2015  CTs 11/06/2015-possible slight enlargement of a left lower lobe nodule, no other evidence of disease progression.  CTs 02/16/2016-enlargement of left-sided pulmonary nodules and mediastinal/left hilar nodes, improved splenomegaly  CTs 06/21/2016-increased size of mediastinal/left hilar nodes, increased left lung nodules, and increased soft tissue at the porta hepatis  CTs 10/28/2016-progressive disease in the chest and abdomen with an enlarging left hilar mass and slight interval enlargement of pulmonary lesions. Right upper quadrant necrotic nodal mass significantly increased in size with mass effect on the liver and possible invasion. Significant compression of the intrahepatic IVC. New right hepatic lobe lesion. Moderate pelvic ascites.  Cycle 1 FOLFIRI/PANITUMUMAB 11/04/2016  Cycle 2 FOLFIRI/PANITUMUMAB 11/18/2016  Cycle 3 FOLFIRI/panitumumab 12/02/2016  Cycle4 FOLFIRI/PANITUMUMAB 12/15/2016  Cycle 5 FOLFIRI/PANITUMUMAB 12/30/2016  CTs 01/06/2017-no evidence of progressive disease, decreased chest adenopathy, stable left lower lobe nodules, decreased liver metastasis, decreased right retroperitoneal mass  Cycle 6 FOLFIRI/panitumumab 01/13/2017  Cycle 7 FOLFIRI/Panitumumab 01/27/2017  Cycle 8 FOLFIRI/panitumumab 02/17/2017  Cycle 9 FOLFIRI/Panitumumab 03/03/2017  Cycle 10 FOLFIRI/Panitumumab 03/24/2017  CTs 04/06/2017-enlargement of right adrenal mass (smaller than October 2019),  stable left hilar fullness and lung nodules  Cycle 11 FOLFIRI/Panitumumab 04/07/2017  Cycle 12 FOLFIRI/panitumumab 04/21/2017 2. History ofIrregular bowel habits/rectal bleeding secondary to #1. 3. History of Mild elevation of the liver enzymes. Question secondary to hepatic steatosis. 4. Indeterminate 8 mm posterior right liver lesion on the staging CT 03/06/2012. 5. Mildly elevated CEA at 5.7 on 03/06/2012. 6. History of radiation erythema at the groin and perineum 7. 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 No clinical evidence of thrombophlebitis or extravasation. 8. Delayed nausea following chemotherapy-Decadron prophylaxis was added with cycle 3 CAPOX-improved. 9. Ileostomy takedown 12/07/2012. 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 11. Port-A-Cath placement 12/31/204 12. Severe neutropenia secondary to chemotherapy following cycle 1 of FOLFIRI, chemotherapy was dose reduced and he received Neulasta with day 15 cycle 1  13. Nausea and vomiting following cycle 1 of FOLFIRI 14. Rectal stricture-manual/colonoscopic dilatation by Dr. Ardis Hughs 03/01/2016  APR 08/17/2016-no evidence of malignancy 15. History ofLeukopenia/Thrombocytopenia-persistent, potentially a sequelae of chemotherapy or hepatic toxicity from chemotherapy/radiation. Bone marrow biopsy 11/20/2014 showed cellular bone marrow with trilineage hematopoiesis. Significant dyspoiesis was not present and there was no evidence of metastatic carcinoma. Cytogenetic analysis showed the presence of normal male chromosomes with no observable clonal chromosomal abnormalities.  probable cirrhosis 16. Genetic testing-negative genetic panel in March 2014 17. 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 Severe over the face-steroid Dosepak prescribed 07/03/2015. Improved 07/10/2015. Further improved 07/24/2015, 08/07/2015, 08/28/2015 18. Diarrhea secondary to  chemotherapy-encouraged to use Imodium  19. History of Paronychia secondary to PANITUMUMAB 20. 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  He continues to have intermittent back pain, most likely related to the right adrenal mass.  He will contact us for consistent pain and we will consider a referral for palliative radiation. The plan is to continue FOLFIRI/panitumumab.  The CEA was lower when he was here 2 weeks ago.  We will follow-up on the CEA from today. We discussed treatment options beyond FOLFIRI/panitumumab.  I gave him a list of clinical trials at [facility].  We can also investigate clinical trial options at other sites.  He will return for an office visit and chemotherapy in 2 weeks.  Betsy Coder, MD  04/21/2017  9:33 AM

## 2017-04-23 ENCOUNTER — Inpatient Hospital Stay: Payer: BC Managed Care – PPO | Attending: Oncology

## 2017-04-23 VITALS — BP 109/75 | HR 96 | Temp 97.8°F | Resp 18

## 2017-04-23 DIAGNOSIS — Z5111 Encounter for antineoplastic chemotherapy: Secondary | ICD-10-CM | POA: Insufficient documentation

## 2017-04-23 DIAGNOSIS — Z5112 Encounter for antineoplastic immunotherapy: Secondary | ICD-10-CM | POA: Diagnosis not present

## 2017-04-23 DIAGNOSIS — E279 Disorder of adrenal gland, unspecified: Secondary | ICD-10-CM | POA: Diagnosis not present

## 2017-04-23 DIAGNOSIS — M545 Low back pain: Secondary | ICD-10-CM | POA: Diagnosis not present

## 2017-04-23 DIAGNOSIS — R197 Diarrhea, unspecified: Secondary | ICD-10-CM | POA: Diagnosis not present

## 2017-04-23 DIAGNOSIS — R911 Solitary pulmonary nodule: Secondary | ICD-10-CM | POA: Diagnosis not present

## 2017-04-23 DIAGNOSIS — Z7189 Other specified counseling: Secondary | ICD-10-CM | POA: Diagnosis not present

## 2017-04-23 DIAGNOSIS — R630 Anorexia: Secondary | ICD-10-CM | POA: Insufficient documentation

## 2017-04-23 DIAGNOSIS — R21 Rash and other nonspecific skin eruption: Secondary | ICD-10-CM | POA: Diagnosis not present

## 2017-04-23 DIAGNOSIS — C20 Malignant neoplasm of rectum: Secondary | ICD-10-CM | POA: Insufficient documentation

## 2017-04-23 DIAGNOSIS — C787 Secondary malignant neoplasm of liver and intrahepatic bile duct: Secondary | ICD-10-CM | POA: Insufficient documentation

## 2017-04-23 DIAGNOSIS — K5900 Constipation, unspecified: Secondary | ICD-10-CM | POA: Diagnosis not present

## 2017-04-23 MED ORDER — SODIUM CHLORIDE 0.9 % IJ SOLN
10.0000 mL | INTRAMUSCULAR | Status: DC | PRN
  Administered 2017-04-23: 10 mL
  Filled 2017-04-23: qty 10

## 2017-04-23 MED ORDER — HEPARIN SOD (PORK) LOCK FLUSH 100 UNIT/ML IV SOLN
500.0000 [IU] | Freq: Once | INTRAVENOUS | Status: AC | PRN
  Administered 2017-04-23: 500 [IU]
  Filled 2017-04-23: qty 5

## 2017-04-23 MED ORDER — PEGFILGRASTIM INJECTION 6 MG/0.6ML ~~LOC~~
6.0000 mg | PREFILLED_SYRINGE | Freq: Once | SUBCUTANEOUS | Status: AC
  Administered 2017-04-23: 6 mg via SUBCUTANEOUS

## 2017-04-23 MED ORDER — PEGFILGRASTIM INJECTION 6 MG/0.6ML ~~LOC~~
PREFILLED_SYRINGE | SUBCUTANEOUS | Status: AC
  Filled 2017-04-23: qty 0.6

## 2017-04-23 NOTE — Patient Instructions (Signed)
Pegfilgrastim injection What is this medicine? PEGFILGRASTIM (PEG fil gra stim) is a long-acting granulocyte colony-stimulating factor that stimulates the growth of neutrophils, a type of white blood cell important in the body's fight against infection. It is used to reduce the incidence of fever and infection in patients with certain types of cancer who are receiving chemotherapy that affects the bone marrow, and to increase survival after being exposed to high doses of radiation. This medicine may be used for other purposes; ask your health care provider or pharmacist if you have questions. COMMON BRAND NAME(S): Neulasta What should I tell my health care provider before I take this medicine? They need to know if you have any of these conditions: -kidney disease -latex allergy -ongoing radiation therapy -sickle cell disease -skin reactions to acrylic adhesives (On-Body Injector only) -an unusual or allergic reaction to pegfilgrastim, filgrastim, other medicines, foods, dyes, or preservatives -pregnant or trying to get pregnant -breast-feeding How should I use this medicine? This medicine is for injection under the skin. If you get this medicine at home, you will be taught how to prepare and give the pre-filled syringe or how to use the On-body Injector. Refer to the patient Instructions for Use for detailed instructions. Use exactly as directed. Tell your healthcare provider immediately if you suspect that the On-body Injector may not have performed as intended or if you suspect the use of the On-body Injector resulted in a missed or partial dose. It is important that you put your used needles and syringes in a special sharps container. Do not put them in a trash can. If you do not have a sharps container, call your pharmacist or healthcare provider to get one. Talk to your pediatrician regarding the use of this medicine in children. While this drug may be prescribed for selected conditions,  precautions do apply. Overdosage: If you think you have taken too much of this medicine contact a poison control center or emergency room at once. NOTE: This medicine is only for you. Do not share this medicine with others. What if I miss a dose? It is important not to miss your dose. Call your doctor or health care professional if you miss your dose. If you miss a dose due to an On-body Injector failure or leakage, a new dose should be administered as soon as possible using a single prefilled syringe for manual use. What may interact with this medicine? Interactions have not been studied. Give your health care provider a list of all the medicines, herbs, non-prescription drugs, or dietary supplements you use. Also tell them if you smoke, drink alcohol, or use illegal drugs. Some items may interact with your medicine. This list may not describe all possible interactions. Give your health care provider a list of all the medicines, herbs, non-prescription drugs, or dietary supplements you use. Also tell them if you smoke, drink alcohol, or use illegal drugs. Some items may interact with your medicine. What should I watch for while using this medicine? You may need blood work done while you are taking this medicine. If you are going to need a MRI, CT scan, or other procedure, tell your doctor that you are using this medicine (On-Body Injector only). What side effects may I notice from receiving this medicine? Side effects that you should report to your doctor or health care professional as soon as possible: -allergic reactions like skin rash, itching or hives, swelling of the face, lips, or tongue -dizziness -fever -pain, redness, or irritation at site   where injected -pinpoint red spots on the skin -red or dark-brown urine -shortness of breath or breathing problems -stomach or side pain, or pain at the shoulder -swelling -tiredness -trouble passing urine or change in the amount of urine Side  effects that usually do not require medical attention (report to your doctor or health care professional if they continue or are bothersome): -bone pain -muscle pain This list may not describe all possible side effects. Call your doctor for medical advice about side effects. You may report side effects to FDA at 1-800-FDA-1088. Where should I keep my medicine? Keep out of the reach of children. Store pre-filled syringes in a refrigerator between 2 and 8 degrees C (36 and 46 degrees F). Do not freeze. Keep in carton to protect from light. Throw away this medicine if it is left out of the refrigerator for more than 48 hours. Throw away any unused medicine after the expiration date. NOTE: This sheet is a summary. It may not cover all possible information. If you have questions about this medicine, talk to your doctor, pharmacist, or health care provider.  2018 Elsevier/Gold Standard (2016-03-04 12:58:03)  

## 2017-04-27 ENCOUNTER — Telehealth: Payer: Self-pay | Admitting: *Deleted

## 2017-04-28 NOTE — Telephone Encounter (Signed)
Late entry for 04/27/17: Left message informing pt of ENT appt on 2/12 @ 1:50. Office contact information left on voicemail as well. Called pt, he is aware of appointment.

## 2017-05-01 ENCOUNTER — Other Ambulatory Visit: Payer: Self-pay | Admitting: Oncology

## 2017-05-05 ENCOUNTER — Encounter: Payer: Self-pay | Admitting: Nurse Practitioner

## 2017-05-05 ENCOUNTER — Inpatient Hospital Stay: Payer: BC Managed Care – PPO

## 2017-05-05 ENCOUNTER — Ambulatory Visit
Admission: RE | Admit: 2017-05-05 | Discharge: 2017-05-05 | Disposition: A | Discharge status: Home / Self Care | Payer: BC Managed Care – PPO | Source: Ambulatory Visit | Attending: Radiation Oncology | Admitting: Radiation Oncology

## 2017-05-05 ENCOUNTER — Inpatient Hospital Stay (HOSPITAL_BASED_OUTPATIENT_CLINIC_OR_DEPARTMENT_OTHER): Payer: BC Managed Care – PPO | Admitting: Nurse Practitioner

## 2017-05-05 VITALS — BP 139/95 | HR 78 | Temp 98.3°F | Resp 18 | Wt 140.6 lb

## 2017-05-05 DIAGNOSIS — C787 Secondary malignant neoplasm of liver and intrahepatic bile duct: Secondary | ICD-10-CM

## 2017-05-05 DIAGNOSIS — C20 Malignant neoplasm of rectum: Secondary | ICD-10-CM

## 2017-05-05 DIAGNOSIS — R21 Rash and other nonspecific skin eruption: Secondary | ICD-10-CM

## 2017-05-05 DIAGNOSIS — E279 Disorder of adrenal gland, unspecified: Secondary | ICD-10-CM

## 2017-05-05 DIAGNOSIS — C7970 Secondary malignant neoplasm of unspecified adrenal gland: Secondary | ICD-10-CM | POA: Insufficient documentation

## 2017-05-05 DIAGNOSIS — K5900 Constipation, unspecified: Secondary | ICD-10-CM

## 2017-05-05 DIAGNOSIS — R630 Anorexia: Secondary | ICD-10-CM

## 2017-05-05 DIAGNOSIS — R911 Solitary pulmonary nodule: Secondary | ICD-10-CM

## 2017-05-05 DIAGNOSIS — Z5112 Encounter for antineoplastic immunotherapy: Secondary | ICD-10-CM | POA: Diagnosis not present

## 2017-05-05 DIAGNOSIS — M545 Low back pain: Secondary | ICD-10-CM | POA: Diagnosis not present

## 2017-05-05 DIAGNOSIS — R197 Diarrhea, unspecified: Secondary | ICD-10-CM | POA: Diagnosis not present

## 2017-05-05 DIAGNOSIS — C7971 Secondary malignant neoplasm of right adrenal gland: Secondary | ICD-10-CM

## 2017-05-05 DIAGNOSIS — Z95828 Presence of other vascular implants and grafts: Secondary | ICD-10-CM

## 2017-05-05 LAB — CBC WITH DIFFERENTIAL (CANCER CENTER ONLY)
BASOS ABS: 0 10*3/uL (ref 0.0–0.1)
BASOS PCT: 0 %
EOS ABS: 0.2 10*3/uL (ref 0.0–0.5)
Eosinophils Relative: 3 %
HCT: 37.7 % — ABNORMAL LOW (ref 38.4–49.9)
HEMOGLOBIN: 12.1 g/dL — AB (ref 13.0–17.1)
Lymphocytes Relative: 13 %
Lymphs Abs: 0.6 10*3/uL — ABNORMAL LOW (ref 0.9–3.3)
MCH: 27.9 pg (ref 27.2–33.4)
MCHC: 32.1 g/dL (ref 32.0–36.0)
MCV: 86.9 fL (ref 79.3–98.0)
MONOS PCT: 13 %
Monocytes Absolute: 0.7 10*3/uL (ref 0.1–0.9)
Neutro Abs: 3.6 10*3/uL (ref 1.5–6.5)
Neutrophils Relative %: 71 %
Platelet Count: 149 10*3/uL (ref 140–400)
RBC: 4.34 MIL/uL (ref 4.20–5.82)
RDW: 19 % — AB (ref 11.0–14.6)
WBC Count: 5 10*3/uL (ref 4.0–10.3)

## 2017-05-05 LAB — CMP (CANCER CENTER ONLY)
ALK PHOS: 110 U/L (ref 40–150)
ALT: 17 U/L (ref 0–55)
AST: 27 U/L (ref 5–34)
Albumin: 3.8 g/dL (ref 3.5–5.0)
Anion gap: 7 (ref 3–11)
BILIRUBIN TOTAL: 0.3 mg/dL (ref 0.2–1.2)
BUN: 7 mg/dL (ref 7–26)
CALCIUM: 9 mg/dL (ref 8.4–10.4)
CO2: 28 mmol/L (ref 22–29)
CREATININE: 0.85 mg/dL (ref 0.70–1.30)
Chloride: 105 mmol/L (ref 98–109)
GFR, Est AFR Am: >60 mL/min (ref >60)
Glucose, Bld: 78 mg/dL (ref 70–140)
Potassium: 3.7 mmol/L (ref 3.5–5.1)
Sodium: 140 mmol/L (ref 136–145)
TOTAL PROTEIN: 6.4 g/dL (ref 6.4–8.3)

## 2017-05-05 LAB — CEA (IN HOUSE-CHCC): CEA (CHCC-In House): 40.5 ng/mL — ABNORMAL HIGH (ref 0.00–5.00)

## 2017-05-05 LAB — MAGNESIUM: Magnesium: 2.6 mg/dL — ABNORMAL HIGH (ref 1.5–2.5)

## 2017-05-05 MED ORDER — IRINOTECAN HCL CHEMO INJECTION 100 MG/5ML
135.0000 mg/m2 | Freq: Once | INTRAVENOUS | Status: AC
  Administered 2017-05-05: 240 mg via INTRAVENOUS
  Filled 2017-05-05: qty 12

## 2017-05-05 MED ORDER — SODIUM CHLORIDE 0.9 % IV SOLN
Freq: Once | INTRAVENOUS | Status: AC
  Administered 2017-05-05: 12:00:00 via INTRAVENOUS
  Filled 2017-05-05: qty 5

## 2017-05-05 MED ORDER — SODIUM CHLORIDE 0.9 % IV SOLN
2400.0000 mg/m2 | INTRAVENOUS | Status: DC
  Administered 2017-05-05: 4300 mg via INTRAVENOUS
  Filled 2017-05-05: qty 86

## 2017-05-05 MED ORDER — SODIUM CHLORIDE 0.9 % IJ SOLN
10.0000 mL | INTRAMUSCULAR | Status: DC | PRN
  Administered 2017-05-05: 10 mL via INTRAVENOUS
  Filled 2017-05-05: qty 10

## 2017-05-05 MED ORDER — SODIUM CHLORIDE 0.9 % IV SOLN
3.0000 mg/kg | Freq: Once | INTRAVENOUS | Status: AC
  Administered 2017-05-05: 200 mg via INTRAVENOUS
  Filled 2017-05-05: qty 10

## 2017-05-05 MED ORDER — LEUCOVORIN CALCIUM INJECTION 350 MG
400.0000 mg/m2 | Freq: Once | INTRAVENOUS | Status: AC
  Administered 2017-05-05: 716 mg via INTRAVENOUS
  Filled 2017-05-05: qty 35.8

## 2017-05-05 MED ORDER — PALONOSETRON HCL INJECTION 0.25 MG/5ML
0.2500 mg | Freq: Once | INTRAVENOUS | Status: AC
  Administered 2017-05-05: 0.25 mg via INTRAVENOUS

## 2017-05-05 MED ORDER — SODIUM CHLORIDE 0.9 % IV SOLN
Freq: Once | INTRAVENOUS | Status: AC
  Administered 2017-05-05: 11:00:00 via INTRAVENOUS

## 2017-05-05 MED ORDER — PALONOSETRON HCL INJECTION 0.25 MG/5ML
INTRAVENOUS | Status: AC
  Filled 2017-05-05: qty 5

## 2017-05-05 NOTE — Patient Instructions (Signed)
[facility] Discharge Instructions for Patients Receiving Chemotherapy  Today you received the following chemotherapy agents: Vectibix, Irinotecan, Leucovorin, & Adrucil  To help prevent nausea and vomiting after your treatment, we encourage you to take your nausea medication as directed   If you develop nausea and vomiting that is not controlled by your nausea medication, call the clinic.   BELOW ARE SYMPTOMS THAT SHOULD BE REPORTED IMMEDIATELY:  *FEVER GREATER THAN 100.5 F  *CHILLS WITH OR WITHOUT FEVER  NAUSEA AND VOMITING THAT IS NOT CONTROLLED WITH YOUR NAUSEA MEDICATION  *UNUSUAL SHORTNESS OF BREATH  *UNUSUAL BRUISING OR BLEEDING  TENDERNESS IN MOUTH AND THROAT WITH OR WITHOUT PRESENCE OF ULCERS  *URINARY PROBLEMS  *BOWEL PROBLEMS  UNUSUAL RASH Items with * indicate a potential emergency and should be followed up as soon as possible.  Feel free to call the clinic should you have any questions or concerns. The clinic phone number is [phone].  Please show the [facility] at check-in to the Emergency Department and triage nurse.

## 2017-05-05 NOTE — Progress Notes (Addendum)
This was a shared visit with Ned Card. [place] OFFICE PROGRESS NOTE   Diagnosis: Rectal cancer  INTERVAL HISTORY:   Jacob [name] returns as scheduled.  He completed another cycle of FOLFIRI/Panitumumab 04/21/2017.  He did not have nausea/vomiting around the time of chemotherapy.  He vomited last night and this morning. Bowels continue to be erratic alternating diarrhea and constipation.  Right-sided back pain is worse.  He does not take pain medication due to concern for constipation.  Rash overall is unchanged.  Appetite is poor.  Objective:  Vital signs in last 24 hours:  Blood pressure (!) 139/95, pulse 78, temperature 98.3 F (36.8 C), temperature source Oral, resp. rate 18, weight 140 lb 9 oz (63.8 kg), SpO2 100 %.    HEENT: No thrush or ulcers. Resp: Lungs clear bilaterally. Cardio: Regular rate and rhythm. GI: Abdomen is soft and nontender.  No hepatomegaly.  Left lower quadrant colostomy. Vascular: No leg edema. Skin: Acne type rash over the face and trunk. Port-A-Cath without erythema.   Lab Results:  Lab Results  Component Value Date   WBC 5.0 05/05/2017   HGB 11.9 (L) 04/21/2017   HCT 37.7 (L) 05/05/2017   MCV 86.9 05/05/2017   PLT 149 05/05/2017   NEUTROABS 3.6 05/05/2017    Imaging:  No results found.  Medications: I have reviewed the patient's current medications.  Assessment/Plan: 1. Rectal cancer. Partially obstructing mass noted 1-2 cm from the anal verge on a colonoscopy 03/06/2012. Endoscopic ultrasound 03/14/2012 with a 7.5 mm thick, 3.2 cm wide hypoechoic, irregularly bordered mass that clearly passed into and through the muscularis propria layer of the distal rectal wall (uT3); 3 small (largest 7 mm) perirectal lymph nodes. The lymph nodes were all round, discrete, hypoechoic, homogenous; suspicious for malignant involvement (uN1).  No RAS mutation identified by [facility] 1 testing on the colon resection specimen 06/29/2012,  APC alteration identified, microsatellite stable  He began radiation and concurrent Xeloda chemotherapy on 03/20/2012, completed 04/27/2012.   Low anterior resection/coloanal anastomosis and diverting ileostomy 06/29/2012 with the final pathology revealing a T2N0 tumor with extensive fibrosis and negative margins.   Cycle 1 of adjuvant CAPOX 07/19/2012. Cycle 5 of adjuvant CAPOX 10/11/2012.   CEA 2.5 on 11/14/2012.   CEA 14.6 03/08/2013.   Restaging CT evaluation 03/08/2013 with a 4 mm pulmonary nodule left lung base not identified on comparison exam; new liver lesions including a 29 x 26 mm irregular peripheral enhancing rounded lesion in the dome of the right hepatic lobe, a less well-defined new subcapsular lesion in the lateral right hepatic lobe measuring 12 mm, a new subcapsular lesion in the anterior right hepatic lobe adjacent to the gallbladder fossa measuring 10 mm; a rounded low-density lesion in the inferior right hepatic lobe measuring 10 mm compared to 7 mm on the prior study (radiologist commented this may represent an enlarged cyst).   MRI of the abdomen 04/12/2013 confirmed multiple T2 hyperintense metastatic lesions throughout the right liver   Initiation of FOLFIRI/Avastin with genotype based irinotecan dosing per the [facility] study 04/05/2013.   Restaging CT evaluation on 06/20/2013 (after 2 cycles/4 treatments) showed improvement in the liver metastases and stable size of a left lower lobe pulmonary nodule now with central cavitation.   Continuation of FOLFIRI/Avastin.   Restaging CT evaluation 08/14/2013-decrease in the size of liver metastases, slight decrease in the size of a cavitary left lower lobe nodule, no evidence of disease progression.   Status post right hepatic lobectomy 09/17/2013.  Pathology showed multiple foci of metastatic adenocarcinoma (5 nodules of metastatic adenocarcinoma 4 of which are subcapsular with the nodules ranging in size from 0.6-1.8  cm in greatest dimension). Margins not involved. Biopsy of a portal lymph node showed benign adipose tissue; no lymph node tissue or malignancy.   Normal CEA 11/06/2013   CT 11/06/2013 with a new pleural-based right lower chest lesion, slight in enlargement of the a left-sided lung nodule  CT 01/29/2014 with a decrease in the right lower chest pleural-based lesion and a slight increase of a left-sided lung lesion, other lung lesions were stable  CT chest 05/02/2014 with a decrease in the size of a right lower lobe pulmonary nodule months similar size of a dominant left lower lobe nodule, minimal enlargement of a smaller left lower lobe nodule, no new site of disease  CT chest 11/07/2014 with a slight increase in several left-sided lung nodules  CT chest 02/10/2015 with new small left hilar lymph nodes, a possible new left lower lobe nodule, and stability of other lung nodules  PET scan 03/12/2015 with hypermetabolic left hilar nodes, hypermetabolic left lower lobe nodules, hyper metabolic retroperitoneal nodes, intense hypermetabolism at the coloanal anastomosis, and hypermetabolic thickening in the presacral space  Cycle 1 FOLFIRI/PANITUMUMAB 05/21/2015  Cycle 2 FOLFIRI/PANITUMUMAB 06/05/2015  Cycle 3 FOLFIRI/PANITUMUMAB 06/19/2015  Cycle 4 FOLFIRI 07/10/2015  Cycle 5 FOLFIRI 07/24/2015  Restaging CTs 08/06/2015-resolution of hilar/retroperitoneal adenopathy, improvement in the hypermetabolic lung nodule, other lung nodules are stable, no new lesions  Cycle 6 FOLFIRI with PANITUMUMAB 08/28/2015 -panitumumab dose reduced  Cycle 7 FOLFIRI 09/11/2015-no panitumumab given  Cycle 8 FOLFIRI with PANITUMUMAB 09/25/2015-PANITUMUMAB dose reduced  Cycle 9 FOLFIRI 10/09/2015-no PANITUMUMAB given  Cycle 10 FOLFIRI with panitumumab 10/23/2015  CTs 11/06/2015-possible slight enlargement of a left lower lobe nodule, no other evidence of disease progression.  CTs 02/16/2016-enlargement of  left-sided pulmonary nodules and mediastinal/left hilar nodes, improved splenomegaly  CTs 06/21/2016-increased size of mediastinal/left hilar nodes, increased left lung nodules, and increased soft tissue at the porta hepatis  CTs 10/28/2016-progressive disease in the chest and abdomen with an enlarging left hilar mass and slight interval enlargement of pulmonary lesions. Right upper quadrant necrotic nodal mass significantly increased in size with mass effect on the liver and possible invasion. Significant compression of the intrahepatic IVC. New right hepatic lobe lesion. Moderate pelvic ascites.  Cycle 1 FOLFIRI/PANITUMUMAB 11/04/2016  Cycle 2 FOLFIRI/PANITUMUMAB 11/18/2016  Cycle 3 FOLFIRI/panitumumab 12/02/2016  Cycle4 FOLFIRI/PANITUMUMAB 12/15/2016  Cycle 5 FOLFIRI/PANITUMUMAB 12/30/2016  CTs 01/06/2017-no evidence of progressive disease, decreased chest adenopathy, stable left lower lobe nodules, decreased liver metastasis, decreased right retroperitoneal mass  Cycle 6 FOLFIRI/panitumumab 01/13/2017  Cycle 7 FOLFIRI/Panitumumab 01/27/2017  Cycle 8 FOLFIRI/panitumumab 02/17/2017  Cycle 9 FOLFIRI/Panitumumab 03/03/2017  Cycle 10 FOLFIRI/Panitumumab 03/24/2017  CTs 04/06/2017-enlargement of right adrenal mass (smaller than October 2019), stable left hilar fullness and lung nodules  Cycle 11 FOLFIRI/Panitumumab 04/07/2017  Cycle 12 FOLFIRI/panitumumab 04/21/2017  Cycle 13 FOLFIRI/Panitumumab 05/05/2017 2. History ofIrregular bowel habits/rectal bleeding secondary to #1. 3. History of Mild elevation of the liver enzymes. Question secondary to hepatic steatosis. 4. Indeterminate 8 mm posterior right liver lesion on the staging CT 03/06/2012. 5. Mildly elevated CEA at 5.7 on 03/06/2012. 6. History of radiation erythema at the groin and perineum 7. 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 No  clinical evidence of thrombophlebitis or extravasation. 8. Delayed nausea following chemotherapy-Decadron prophylaxis was added with cycle 3 CAPOX-improved. 9. Ileostomy  takedown 12/07/2012. 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 11. Port-A-Cath placement 12/31/204 12. Severe neutropenia secondary to chemotherapy following cycle 1 of FOLFIRI, chemotherapy was dose reduced and he received Neulasta with day 15 cycle 1  13. Nausea and vomiting following cycle 1 of FOLFIRI 14. Rectal stricture-manual/colonoscopic dilatation by Dr. Ardis Hughs 03/01/2016  APR 08/17/2016-no evidence of malignancy 15. History ofLeukopenia/Thrombocytopenia-persistent, potentially a sequelae of chemotherapy or hepatic toxicity from chemotherapy/radiation. Bone marrow biopsy 11/20/2014 showed cellular bone marrow with trilineage hematopoiesis. Significant dyspoiesis was not present and there was no evidence of metastatic carcinoma. Cytogenetic analysis showed the presence of normal male chromosomes with no observable clonal chromosomal abnormalities.  probable cirrhosis 16. Genetic testing-negative genetic panel in March 2014 17. 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 Severe over the face-steroid Dosepak prescribed 07/03/2015. Improved 07/10/2015. Further improved 07/24/2015, 08/07/2015, 08/28/2015 18. Diarrhea secondary to chemotherapy-encouraged to use Imodium  19. History of Paronychia secondary to PANITUMUMAB 20. 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  He has completed 12 cycles of FOLFIRI/Panitumumab.  Plan to proceed with cycle 13 today as scheduled.  At today's visit he reports the back pain is worse.  He understands the pain is most likely related to the right adrenal mass.  We made a referral to radiation oncology.  We again discussed treatment options beyond FOLFIRI/Panitumumab.  Dr. Benay Spice recommends a referral to Dr.  Altamease Oiler at [facility].  Jacob [name] is in agreement.  He will return for a follow-up visit as scheduled in 3 weeks.  He will contact the office in the interim with any problems.  Patient seen with Dr. Benay Spice.  25 minutes were spent face-to-face at today's visit with the majority of that time involved in counseling/coordination of care.      Ned Card ANP/GNP-BC   05/05/2017  1:31 PM Jacob [name] has increased back pain, most likely secondary to the right adrenal mass.  There is no other evidence of disease progression.  He will complete another cycle of chemotherapy today.  We will refer him for palliative radiation to the right adrenal mass. He will be referred to [facility] to discuss clinical trial options.  Julieanne Manson, MD

## 2017-05-05 NOTE — Progress Notes (Signed)
Radiation Oncology         [phone] ________________________________  Name: Jacob [name]        MRN: [id number]  Date of Service: 05/05/2017 DOB: [birth year]  SA:[id number], Bonnita Levan, MD  Ladell Pier, MD     REFERRING PHYSICIAN: Ladell Pier, MD   DIAGNOSIS: There were no encounter diagnoses.   HISTORY OF PRESENT ILLNESS: Jacob [name] is a 47 y.o. male seen at the request of Dr. Benay Spice for a history of rectal cancer with progressive metastatic disease to the right adrenal gland. The patient was originally treated with curative intent between December through February of 2014.  He can continue to proceed with aggressive approach and underwent LAR with coloanal anastomosis and diverting ileostomy in April 2014.  He continued on systemic therapy and unfortunately was found to have metastatic disease to the liver in 2015.  He also underwent partial hepatectomy, and cholecystectomy.  He is continued on systemic therapy, and has had improvement of additional disease within the lungs that had been found, and also has a known site of metastatic disease to the right adrenal gland, he continues on  FOLFIRI and Panitumumab.  He is seen today in the chemo suite while receiving this infusion for consideration of radiotherapy. His most recent CT scan on 04/05/2017 revealed a mass in the right adrenal gland measuring 8.5 x 4.6 x 3.5 cm.  There was evidence of central necrosis, and was enlarged compared to his scan from 01/06/2017.  Otherwise he has had stability of disease in the lungs, and in the presacral/perirectal space.  PREVIOUS RADIATION THERAPY: Yes    03/20/2012 through 04/27/2012: The patient was initially treated using a whole pelvis technique with 4 fields to a dose of 45 gray at 1.8 gray per fraction. The patient then received a cone down, 4 field boost treatment for an additional 5.4 gray. The total dose was 50.4 gray.   PAST MEDICAL HISTORY:  Past Medical History:  Diagnosis Date  .  Allergic state 01/19/2012  . Cancer ([place]) 03/06/12   rectal bx=Adenocarcinoma  PT HAD RADIATION , CHEMO SURGERY  . Chicken pox as a child   ?  Marland Kitchen Dysrhythmia as child   HX OF IRREGULAR HB AT TIME OF TONSILLECTOMY - SURGERY WAS NOT DONE-CAN'T REMEMBER ANY OTHER DETAILS- NEVER HAD THE SURGERY.  . Elevated liver function tests 01/19/2012  . Fatty liver   . Hernia 6 months old  . History of diarrhea    better since chemo finished  . Hx of migraines   . Lung abnormality 2015  . Lung nodule 08/2013  . Lung nodule, multiple 11/29/2013  . Migraine headache as a child  . Numbness    TOES OF BOTH FEET.  Marland Kitchen Overweight(278.02) 01/19/2012  . Pain    LEFT HEEL PAIN -SEVERE ESPECIALLY AFTER SITTING OR LYING DOWN - DIFFICULT TO GET OUT OF BED AND WALK IN THE MORNINGS BECAUSE OF HEEL PAIN  . 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 Preventative health care 01/19/2012  . Radiation 03/20/12-04/27/12   Pelvis 50.4 gray Rectal cancer  . Rectal cancer ([place]) 03/06/12   biopsy-adenocarcinoma  . Reflux 01/19/2012  . Sun-damaged skin 01/19/2012       PAST SURGICAL HISTORY: Past Surgical History:  Procedure Laterality Date  . CHOLECYSTECTOMY N/A 09/17/2013   Procedure: CHOLECYSTECTOMY;  Surgeon: Stark Klein, MD;  Location: [facility];  Service: General;  Laterality: N/A;  . COLOSTOMY  TAKEDOWN N/A 08/17/2016   Procedure: LAPAROSCOPIC ABDOMINOPERINEAL RESECTION WITH PERMANENT COLOSTOMY;  Surgeon: Leighton Ruff, MD;  Location: [facility];  Service: General;  Laterality: N/A;  . CYST EXCISION     L EAR AREA  . EUS  03/14/2012   Procedure: LOWER ENDOSCOPIC ULTRASOUND (EUS);  Surgeon: Milus Banister, MD;  Location: [facility];  Service: Endoscopy;  Laterality: N/A;  . HERNIA REPAIR  6 months old   right inguinal repair  . ILEOSTOMY CLOSURE N/A 12/07/2012   Procedure: ILEOSTOMY TAKEDOWN;  Surgeon: Leighton Ruff, MD;  Location: [facility];  Service: General;  Laterality: N/A;   . LAPAROSCOPIC LOW ANTERIOR RESECTION N/A 06/29/2012   Procedure: LAPAROSCOPIC LOW ANTERIOR RESECTION, mobilization splenic flexure,coloanal anastomosis,diverting ileostomy;  Surgeon: Leighton Ruff, MD;  Location: [facility];  Service: General;  Laterality: N/A;  . LAPAROSCOPY N/A 09/17/2013   Procedure: LAPAROSCOPY DIAGNOSTIC;  Surgeon: Stark Klein, MD;  Location: [facility];  Service: General;  Laterality: N/A;  . LIVER ULTRASOUND N/A 09/17/2013   Procedure: LIVER ULTRASOUND;  Surgeon: Stark Klein, MD;  Location: [facility];  Service: General;  Laterality: N/A;  . OPEN HEPATECTOMY  N/A 09/17/2013   Procedure: OPEN HEPATECTOMY;  Surgeon: Stark Klein, MD;  Location: [facility];  Service: General;  Laterality: N/A;  . PORTACATH PLACEMENT Left 03/21/2013   Procedure: INSERTION PORT-A-CATH;  Surgeon: Leighton Ruff, MD;  Location: [facility];  Service: General;  Laterality: Left;  . RECTAL BIOPSY  03/06/12   distal mass1-2cm from anal verge=Adenocarcinoma     FAMILY HISTORY:  Family History  Adopted: Yes  Problem Relation Age of Onset  . Cancer Father   . Heart disease Father   . Colon cancer Neg Hx      SOCIAL HISTORY:  reports that  has never smoked. he has never used smokeless tobacco. He reports that he drinks alcohol. He reports that he does not use drugs.  The patient is married.  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   ALLERGIES: Penicillins   MEDICATIONS:  Current Outpatient Medications  Medication Sig Dispense Refill  . ALPRAZolam (XANAX) 0.25 MG tablet Take 1 tablet (0.25 mg total) by mouth at bedtime as needed for anxiety. 30 tablet 1  . lidocaine-prilocaine (EMLA) cream Apply 1 application topically as needed. Apply to port site 1 hour prior to use. 30 g 3  . minocycline (MINOCIN,DYNACIN) 100 MG capsule TAKE 1 CAPSULE BY MOUTH TWICE DAILY 60 capsule 2  . MYRBETRIQ 25 MG TB24 tablet     . naproxen sodium (ALEVE) 220 MG tablet Take 220 mg by mouth daily as needed (for  headache). Reported on 08/28/2015    . oxyCODONE (OXY IR/ROXICODONE) 5 MG immediate release tablet Take 1 tablet (5 mg total) by mouth every 6 (six) hours as needed for severe pain. 30 tablet 0  . Probiotic Product (SOLUBLE FIBER/PROBIOTICS PO) Take 1 capsule by mouth 3 (three) times a week.     . prochlorperazine (COMPAZINE) 10 MG tablet Take 1 tablet (10 mg total) by mouth every 6 (six) hours as needed for nausea or vomiting. (Patient not taking: Reported on 04/07/2017) 30 tablet 0   No current facility-administered medications for this encounter.    Facility-Administered Medications Ordered in Other Encounters  Medication Dose Route Frequency Provider Last Rate Last Dose  . atropine injection 0.5 mg  0.5 mg Intravenous Once PRN Betsy Coder B, MD      . atropine injection 0.5 mg  0.5 mg Intravenous Once  PRN Ladell Pier, MD      . clindamycin (CLEOCIN) 900 mg in dextrose 5 % 50 mL IVPB  900 mg Intravenous 60 min Pre-Op Leighton Ruff, MD       And  . gentamicin (GARAMYCIN) 340 mg in dextrose 5 % 50 mL IVPB  5 mg/kg Intravenous 60 min Pre-Op Leighton Ruff, MD      . fluorouracil (ADRUCIL) 4,300 mg in sodium chloride 0.9 % 64 mL chemo infusion  2,400 mg/m2 (Treatment Plan Recorded) Intravenous 1 day or 1 dose Ladell Pier, MD      . irinotecan (CAMPTOSAR) 240 mg in dextrose 5 % 500 mL chemo infusion  135 mg/m2 (Treatment Plan Recorded) Intravenous Once Ladell Pier, MD 341 mL/hr at 05/05/17 1356 240 mg at 05/05/17 1356  . leucovorin 716 mg in dextrose 5 % 250 mL infusion  400 mg/m2 (Treatment Plan Recorded) Intravenous Once Ladell Pier, MD 191 mL/hr at 05/05/17 1356 716 mg at 05/05/17 1356     REVIEW OF SYSTEMS: On review of systems, the patient reports that he is doing okay overall. He reports issues with constipation and increased abdominal pain when he becomes constipated. He describes right flank pain regularly that as above is worsened by constipation. He denies any chest  pain, shortness of breath, cough, fevers, chills, night sweats, unintended weight changes. He denies any bladder disturbances, and denies abdominal pain, nausea or vomiting. He denies any new musculoskeletal or joint aches or pains. A complete review of systems is obtained and is otherwise negative.     PHYSICAL EXAM:  Wt Readings from Last 3 Encounters:  05/05/17 140 lb 9 oz (63.8 kg)  04/21/17 142 lb 12.8 oz (64.8 kg)  04/07/17 139 lb 6.4 oz (63.2 kg)   Temp Readings from Last 3 Encounters:  05/05/17 98.3 F (36.8 C) (Oral)  04/23/17 97.8 F (36.6 C) (Oral)  04/21/17 97.9 F (36.6 C) (Oral)   BP Readings from Last 3 Encounters:  05/05/17 (!) 139/95  04/23/17 109/75  04/21/17 122/84   Pulse Readings from Last 3 Encounters:  05/05/17 78  04/23/17 96  04/21/17 80     In general this is a chronically ill appearing caucasian male in no acute distress. He is alert and oriented x4 and appropriate throughout the examination. HEENT reveals that the patient is normocephalic, atraumatic. EOMs are intact. PERRLA. Skin is intact without any evidence of gross lesions. Cardiopulmonary assessment is negative for acute distress and he exhibits normal effort.    ECOG = 1  0 - Asymptomatic (Fully active, able to carry on all predisease activities without restriction)  1 - Symptomatic but completely ambulatory (Restricted in physically strenuous activity but ambulatory and able to carry out work of a light or sedentary nature. For example, light housework, office work)  2 - Symptomatic, <50% in bed during the day (Ambulatory and capable of all self care but unable to carry out any work activities. Up and about more than 50% of waking hours)  3 - Symptomatic, >50% in bed, but not bedbound (Capable of only limited self-care, confined to bed or chair 50% or more of waking hours)  4 - Bedbound (Completely disabled. Cannot carry on any self-care. Totally confined to bed or chair)  5 - Death    Eustace Pen MM, Creech RH, Tormey DC, et al. [phone]). "Toxicity and response criteria of the [facility] Group". Protivin Oncol. 5 (6): 649-55    LABORATORY DATA:  Lab  Results  Component Value Date   WBC 5.0 05/05/2017   HGB 11.9 (L) 04/21/2017   HCT 37.7 (L) 05/05/2017   MCV 86.9 05/05/2017   PLT 149 05/05/2017   Lab Results  Component Value Date   NA 140 05/05/2017   K 3.7 05/05/2017   CL 105 05/05/2017   CO2 28 05/05/2017   Lab Results  Component Value Date   ALT 17 05/05/2017   AST 27 05/05/2017   ALKPHOS 110 05/05/2017   BILITOT 0.3 05/05/2017      RADIOGRAPHY: Ct Chest W Contrast  Result Date: 04/06/2017 CLINICAL DATA:  Rectal cancer with metastatic disease to the liver. Prior distal colon resection with permanent colostomy and prior liver surgery. Right back pain. Cough and shortness of breath. Diarrhea. EXAM: CT CHEST, ABDOMEN, AND PELVIS WITH CONTRAST TECHNIQUE: Multidetector CT imaging of the chest, abdomen and pelvis was performed following the standard protocol during bolus administration of intravenous contrast. CONTRAST:  100 cc Isovue-300 COMPARISON:  01/06/2017 FINDINGS: CT CHEST FINDINGS Cardiovascular: Left Port-A-Cath tip: SVC. Mediastinum/Nodes: Tumor in the left AP window, left hilum, and left infrahilar region. AP window tumor measures 2.3 cm in thickness, stable compared to the prior exam. Soft tissue density posterior to the narrowed left lower lobe pulmonary artery measures 1.7 cm in thickness on image 26/2, stable. Left infrahilar node measures 1.0 cm in short axis on image 29/2, stable. Lungs/Pleura: Mild biapical pleuroparenchymal scarring. Mild paraseptal emphysema. Stable scarring, posterior basal segment right lower lobe. Slightly reduced thickness of the scarring in the left upper lobe. The more anterior left lower lobe nodule measures 2.0 by 1.2 cm on image 90/6, stable by my measurements. The more posterior left lower lobe pulmonary nodule  measures 1.2 by 0.7 cm, likewise stable according to my measurements of current and prior nodule. No new nodule. Musculoskeletal: Unremarkable CT ABDOMEN PELVIS FINDINGS Hepatobiliary: Prior partial right hepatectomy. Reduce conspicuity of the lesion posteriorly in the dome of the right hepatic lobe, a residual lesion in this vicinity measures 1.2 by 0.8 cm on image 47/2 and abnormal enhancement in this region previously measured 2.1 by 2.0 cm. No new liver lesions seen. Pancreas: Unremarkable Spleen: The spleen measures 11.1 by 6.3 by 13.2 cm (volume = 480 cm^3) and demonstrates no appreciable focal abnormality. Adrenals/Urinary Tract: Inseparable from the right adrenal gland is a large 8.5 by by 4.6 cm mass on image 54/2 (previously 8.0 by 3.5 cm) with some evidence of central necrosis. The mass has enlarged compared to 01/06/2017 although is still smaller than it was on 10/28/2016. This mass is inseparable from the crus of the right hemidiaphragm and abuts the posterior liver margin. There is some associated narrowing adjacent infra hepatic IVC due to direct mass effect. The kidneys appear unremarkable. Stomach/Bowel: Left colostomy with some mild but reduced stranding in the adipose tissues surrounding the stoma. Twisting of the terminal ileum on images 84-73 of series 4 without obstruction. Vascular/Lymphatic: Further reduction in conspicuity of the prior small focal thrombosis at the junction of the SMV and splenic vein into the portal vein. This was previously much more parent for example on the 10/28/2016 exam. Small retroperitoneal lymph nodes are not pathologically enlarged by size criteria. Reproductive: Mildly heterogeneous prostate gland with indistinct seminal vesicles. Some of this may be related to prior therapy in this region. Other: Rectal resection with presacral soft tissue density again noted. Reduced stranding in the original perirectal stretch that reduced stranding and nodularity in the  original perirectal space  and above the seminal vesicles. There a bandlike density in the right presacral/perirectal space but which may well be due to scarring. The stranding and nodularity in this region has not completely resolved. Musculoskeletal: Degenerative disc disease at L4-5 and L5-S1. 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 IMPRESSION: 1. The right adrenal mass has enlarged compared to 01/06/2017, but is still smaller than it was on 10/28/2016. This causes mass effect on the adjacent IVC, without definite current thrombosis. Stable tumor in the AP window extending into the left hilar and infrahilar regions. 2. Stable left lower lobe pulmonary nodules. 3. Reduced stranding and slight reduction in nodularity in the presacral/original perirectal space. There is still some residual stranding and nodularity in this region likely mostly post therapy related, will merit surveillance. 4. Mild stranding in the adipose tissue brought out with the ostomy, although improved from prior. 5. Further reduction in conspicuity of the prior small focal thrombosis at the junction of the SMV and splenic vein, now essentially resolved. 6. Other imaging findings of potential clinical significance: Mild paraseptal emphysema. Partial right hepatectomy. Mild splenomegaly. Lower lumbar degenerative disc disease. 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 Electronically Signed   By: Van Clines M.D.   On: 04/06/2017 09:35   Ct Abdomen Pelvis W Contrast  Result Date: 04/06/2017 CLINICAL DATA:  Rectal cancer with metastatic disease to the liver. Prior distal colon resection with permanent colostomy and prior liver surgery. Right back pain. Cough and shortness of breath. Diarrhea. EXAM: CT CHEST, ABDOMEN, AND PELVIS WITH CONTRAST TECHNIQUE: Multidetector CT imaging of the chest, abdomen and pelvis was performed following the standard protocol during bolus administration of intravenous contrast. CONTRAST:   100 cc Isovue-300 COMPARISON:  01/06/2017 FINDINGS: CT CHEST FINDINGS Cardiovascular: Left Port-A-Cath tip: SVC. Mediastinum/Nodes: Tumor in the left AP window, left hilum, and left infrahilar region. AP window tumor measures 2.3 cm in thickness, stable compared to the prior exam. Soft tissue density posterior to the narrowed left lower lobe pulmonary artery measures 1.7 cm in thickness on image 26/2, stable. Left infrahilar node measures 1.0 cm in short axis on image 29/2, stable. Lungs/Pleura: Mild biapical pleuroparenchymal scarring. Mild paraseptal emphysema. Stable scarring, posterior basal segment right lower lobe. Slightly reduced thickness of the scarring in the left upper lobe. The more anterior left lower lobe nodule measures 2.0 by 1.2 cm on image 90/6, stable by my measurements. The more posterior left lower lobe pulmonary nodule measures 1.2 by 0.7 cm, likewise stable according to my measurements of current and prior nodule. No new nodule. Musculoskeletal: Unremarkable CT ABDOMEN PELVIS FINDINGS Hepatobiliary: Prior partial right hepatectomy. Reduce conspicuity of the lesion posteriorly in the dome of the right hepatic lobe, a residual lesion in this vicinity measures 1.2 by 0.8 cm on image 47/2 and abnormal enhancement in this region previously measured 2.1 by 2.0 cm. No new liver lesions seen. Pancreas: Unremarkable Spleen: The spleen measures 11.1 by 6.3 by 13.2 cm (volume = 480 cm^3) and demonstrates no appreciable focal abnormality. Adrenals/Urinary Tract: Inseparable from the right adrenal gland is a large 8.5 by by 4.6 cm mass on image 54/2 (previously 8.0 by 3.5 cm) with some evidence of central necrosis. The mass has enlarged compared to 01/06/2017 although is still smaller than it was on 10/28/2016. This mass is inseparable from the crus of the right hemidiaphragm and abuts the posterior liver margin. There is some associated narrowing adjacent infra hepatic IVC due to direct mass effect. The  kidneys appear  unremarkable. Stomach/Bowel: Left colostomy with some mild but reduced stranding in the adipose tissues surrounding the stoma. Twisting of the terminal ileum on images 84-73 of series 4 without obstruction. Vascular/Lymphatic: Further reduction in conspicuity of the prior small focal thrombosis at the junction of the SMV and splenic vein into the portal vein. This was previously much more parent for example on the 10/28/2016 exam. Small retroperitoneal lymph nodes are not pathologically enlarged by size criteria. Reproductive: Mildly heterogeneous prostate gland with indistinct seminal vesicles. Some of this may be related to prior therapy in this region. Other: Rectal resection with presacral soft tissue density again noted. Reduced stranding in the original perirectal stretch that reduced stranding and nodularity in the original perirectal space and above the seminal vesicles. There a bandlike density in the right presacral/perirectal space but which may well be due to scarring. The stranding and nodularity in this region has not completely resolved. Musculoskeletal: Degenerative disc disease at L4-5 and L5-S1. 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 IMPRESSION: 1. The right adrenal mass has enlarged compared to 01/06/2017, but is still smaller than it was on 10/28/2016. This causes mass effect on the adjacent IVC, without definite current thrombosis. Stable tumor in the AP window extending into the left hilar and infrahilar regions. 2. Stable left lower lobe pulmonary nodules. 3. Reduced stranding and slight reduction in nodularity in the presacral/original perirectal space. There is still some residual stranding and nodularity in this region likely mostly post therapy related, will merit surveillance. 4. Mild stranding in the adipose tissue brought out with the ostomy, although improved from prior. 5. Further reduction in conspicuity of the prior small focal thrombosis at the  junction of the SMV and splenic vein, now essentially resolved. 6. Other imaging findings of potential clinical significance: Mild paraseptal emphysema. Partial right hepatectomy. Mild splenomegaly. Lower lumbar degenerative disc disease. 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 Electronically Signed   By: Van Clines M.D.   On: 04/06/2017 09:35       IMPRESSION/PLAN: 1. Metastatic rectal cancer to the right adrenal gland. Dr. Lisbeth Renshaw and I discussed his case, and previous therapies as well as most recent imaging.  Dr. Lisbeth Renshaw has reviewed his films, and recommends a course of palliative radiotherapy, and would recommend a course of 50 Gy in 8 fractions.  I met with the patient today in chemotherapy to review these options, and the patient is interested in proceeding.  We discussed the risks, benefits, short, and long term effects of radiotherapy, and he will simulate tomorrow. Written consent is obtained and placed in the chart, a copy was provided to the patient.   In a visit lasting 45 minutes, greater than 50% of the time was spent face to face discussing his case, and coordinating the patient's care.     Carola Rhine, PAC

## 2017-05-06 ENCOUNTER — Ambulatory Visit
Admission: RE | Admit: 2017-05-06 | Discharge: 2017-05-06 | Disposition: A | Discharge status: Home / Self Care | Payer: BC Managed Care – PPO | Source: Ambulatory Visit | Attending: Radiation Oncology | Admitting: Radiation Oncology

## 2017-05-06 DIAGNOSIS — R630 Anorexia: Secondary | ICD-10-CM | POA: Diagnosis not present

## 2017-05-06 DIAGNOSIS — M549 Dorsalgia, unspecified: Secondary | ICD-10-CM | POA: Insufficient documentation

## 2017-05-06 DIAGNOSIS — C7971 Secondary malignant neoplasm of right adrenal gland: Secondary | ICD-10-CM

## 2017-05-06 DIAGNOSIS — C20 Malignant neoplasm of rectum: Secondary | ICD-10-CM | POA: Insufficient documentation

## 2017-05-06 DIAGNOSIS — R1110 Vomiting, unspecified: Secondary | ICD-10-CM | POA: Insufficient documentation

## 2017-05-06 DIAGNOSIS — K5900 Constipation, unspecified: Secondary | ICD-10-CM | POA: Insufficient documentation

## 2017-05-06 DIAGNOSIS — Z510 Encounter for antineoplastic radiation therapy: Secondary | ICD-10-CM | POA: Diagnosis not present

## 2017-05-07 ENCOUNTER — Inpatient Hospital Stay: Payer: BC Managed Care – PPO

## 2017-05-07 VITALS — BP 114/78 | HR 72 | Temp 97.5°F | Resp 20

## 2017-05-07 DIAGNOSIS — Z5112 Encounter for antineoplastic immunotherapy: Secondary | ICD-10-CM | POA: Diagnosis not present

## 2017-05-07 DIAGNOSIS — C787 Secondary malignant neoplasm of liver and intrahepatic bile duct: Principal | ICD-10-CM

## 2017-05-07 DIAGNOSIS — C20 Malignant neoplasm of rectum: Secondary | ICD-10-CM

## 2017-05-07 MED ORDER — PEGFILGRASTIM INJECTION 6 MG/0.6ML ~~LOC~~
6.0000 mg | PREFILLED_SYRINGE | Freq: Once | SUBCUTANEOUS | Status: AC
  Administered 2017-05-07: 6 mg via SUBCUTANEOUS

## 2017-05-07 MED ORDER — SODIUM CHLORIDE 0.9 % IJ SOLN
10.0000 mL | INTRAMUSCULAR | Status: DC | PRN
  Administered 2017-05-07: 10 mL
  Filled 2017-05-07: qty 10

## 2017-05-07 MED ORDER — HEPARIN SOD (PORK) LOCK FLUSH 100 UNIT/ML IV SOLN
500.0000 [IU] | Freq: Once | INTRAVENOUS | Status: AC | PRN
  Administered 2017-05-07: 500 [IU]
  Filled 2017-05-07: qty 5

## 2017-05-07 MED ORDER — PEGFILGRASTIM INJECTION 6 MG/0.6ML ~~LOC~~
PREFILLED_SYRINGE | SUBCUTANEOUS | Status: AC
  Filled 2017-05-07: qty 0.6

## 2017-05-07 NOTE — Patient Instructions (Signed)
Pegfilgrastim injection What is this medicine? PEGFILGRASTIM (PEG fil gra stim) is a long-acting granulocyte colony-stimulating factor that stimulates the growth of neutrophils, a type of white blood cell important in the body's fight against infection. It is used to reduce the incidence of fever and infection in patients with certain types of cancer who are receiving chemotherapy that affects the bone marrow, and to increase survival after being exposed to high doses of radiation. This medicine may be used for other purposes; ask your health care provider or pharmacist if you have questions. COMMON BRAND NAME(S): Neulasta What should I tell my health care provider before I take this medicine? They need to know if you have any of these conditions: -kidney disease -latex allergy -ongoing radiation therapy -sickle cell disease -skin reactions to acrylic adhesives (On-Body Injector only) -an unusual or allergic reaction to pegfilgrastim, filgrastim, other medicines, foods, dyes, or preservatives -pregnant or trying to get pregnant -breast-feeding How should I use this medicine? This medicine is for injection under the skin. If you get this medicine at home, you will be taught how to prepare and give the pre-filled syringe or how to use the On-body Injector. Refer to the patient Instructions for Use for detailed instructions. Use exactly as directed. Tell your healthcare provider immediately if you suspect that the On-body Injector may not have performed as intended or if you suspect the use of the On-body Injector resulted in a missed or partial dose. It is important that you put your used needles and syringes in a special sharps container. Do not put them in a trash can. If you do not have a sharps container, call your pharmacist or healthcare provider to get one. Talk to your pediatrician regarding the use of this medicine in children. While this drug may be prescribed for selected conditions,  precautions do apply. Overdosage: If you think you have taken too much of this medicine contact a poison control center or emergency room at once. NOTE: This medicine is only for you. Do not share this medicine with others. What if I miss a dose? It is important not to miss your dose. Call your doctor or health care professional if you miss your dose. If you miss a dose due to an On-body Injector failure or leakage, a new dose should be administered as soon as possible using a single prefilled syringe for manual use. What may interact with this medicine? Interactions have not been studied. Give your health care provider a list of all the medicines, herbs, non-prescription drugs, or dietary supplements you use. Also tell them if you smoke, drink alcohol, or use illegal drugs. Some items may interact with your medicine. This list may not describe all possible interactions. Give your health care provider a list of all the medicines, herbs, non-prescription drugs, or dietary supplements you use. Also tell them if you smoke, drink alcohol, or use illegal drugs. Some items may interact with your medicine. What should I watch for while using this medicine? You may need blood work done while you are taking this medicine. If you are going to need a MRI, CT scan, or other procedure, tell your doctor that you are using this medicine (On-Body Injector only). What side effects may I notice from receiving this medicine? Side effects that you should report to your doctor or health care professional as soon as possible: -allergic reactions like skin rash, itching or hives, swelling of the face, lips, or tongue -dizziness -fever -pain, redness, or irritation at site   where injected -pinpoint red spots on the skin -red or dark-brown urine -shortness of breath or breathing problems -stomach or side pain, or pain at the shoulder -swelling -tiredness -trouble passing urine or change in the amount of urine Side  effects that usually do not require medical attention (report to your doctor or health care professional if they continue or are bothersome): -bone pain -muscle pain This list may not describe all possible side effects. Call your doctor for medical advice about side effects. You may report side effects to FDA at 1-800-FDA-1088. Where should I keep my medicine? Keep out of the reach of children. Store pre-filled syringes in a refrigerator between 2 and 8 degrees C (36 and 46 degrees F). Do not freeze. Keep in carton to protect from light. Throw away this medicine if it is left out of the refrigerator for more than 48 hours. Throw away any unused medicine after the expiration date. NOTE: This sheet is a summary. It may not cover all possible information. If you have questions about this medicine, talk to your doctor, pharmacist, or health care provider.  2018 Elsevier/Gold Standard (2016-03-04 12:58:03)  

## 2017-05-09 ENCOUNTER — Telehealth: Payer: Self-pay | Admitting: Oncology

## 2017-05-09 NOTE — Telephone Encounter (Signed)
Pt appt with Dr. Altamease Oiler is 06/02/17 @12 :64. Pt is aware. Faxed path reports.

## 2017-05-10 DIAGNOSIS — Z510 Encounter for antineoplastic radiation therapy: Secondary | ICD-10-CM | POA: Diagnosis not present

## 2017-05-11 ENCOUNTER — Other Ambulatory Visit: Payer: Self-pay

## 2017-05-11 ENCOUNTER — Encounter (HOSPITAL_BASED_OUTPATIENT_CLINIC_OR_DEPARTMENT_OTHER): Payer: Self-pay | Admitting: *Deleted

## 2017-05-13 ENCOUNTER — Other Ambulatory Visit: Payer: Self-pay | Admitting: Otolaryngology

## 2017-05-16 ENCOUNTER — Ambulatory Visit (HOSPITAL_BASED_OUTPATIENT_CLINIC_OR_DEPARTMENT_OTHER)
Admission: RE | Admit: 2017-05-16 | Discharge: 2017-05-16 | Disposition: A | Discharge status: Home / Self Care | Payer: BC Managed Care – PPO | Source: Ambulatory Visit | Attending: Otolaryngology | Admitting: Otolaryngology

## 2017-05-16 ENCOUNTER — Ambulatory Visit (HOSPITAL_BASED_OUTPATIENT_CLINIC_OR_DEPARTMENT_OTHER): Payer: BC Managed Care – PPO | Admitting: Certified Registered"

## 2017-05-16 ENCOUNTER — Encounter (HOSPITAL_BASED_OUTPATIENT_CLINIC_OR_DEPARTMENT_OTHER)
Admission: RE | Disposition: A | Discharge status: Home / Self Care | Payer: Self-pay | Source: Ambulatory Visit | Attending: Otolaryngology

## 2017-05-16 ENCOUNTER — Encounter (HOSPITAL_BASED_OUTPATIENT_CLINIC_OR_DEPARTMENT_OTHER): Payer: Self-pay | Admitting: Certified Registered"

## 2017-05-16 DIAGNOSIS — Z791 Long term (current) use of non-steroidal anti-inflammatories (NSAID): Secondary | ICD-10-CM | POA: Insufficient documentation

## 2017-05-16 DIAGNOSIS — Z923 Personal history of irradiation: Secondary | ICD-10-CM | POA: Insufficient documentation

## 2017-05-16 DIAGNOSIS — Z79891 Long term (current) use of opiate analgesic: Secondary | ICD-10-CM | POA: Insufficient documentation

## 2017-05-16 DIAGNOSIS — Z85048 Personal history of other malignant neoplasm of rectum, rectosigmoid junction, and anus: Secondary | ICD-10-CM | POA: Insufficient documentation

## 2017-05-16 DIAGNOSIS — J3801 Paralysis of vocal cords and larynx, unilateral: Secondary | ICD-10-CM | POA: Diagnosis not present

## 2017-05-16 DIAGNOSIS — Z79899 Other long term (current) drug therapy: Secondary | ICD-10-CM | POA: Insufficient documentation

## 2017-05-16 DIAGNOSIS — Z9221 Personal history of antineoplastic chemotherapy: Secondary | ICD-10-CM | POA: Insufficient documentation

## 2017-05-16 HISTORY — PX: MICROLARYNGOSCOPY W/VOCAL CORD INJECTION: SHX2665

## 2017-05-16 SURGERY — MICROLARYNGOSCOPY, WITH VOCAL CORD INJECTION
Anesthesia: General | Site: Throat | Laterality: Left

## 2017-05-16 MED ORDER — SCOPOLAMINE 1 MG/3DAYS TD PT72: 1.0000 | MEDICATED_PATCH | Freq: Once | TRANSDERMAL | Status: DC | PRN

## 2017-05-16 MED ORDER — DEXAMETHASONE SODIUM PHOSPHATE 4 MG/ML IJ SOLN
INTRAMUSCULAR | Status: DC | PRN
  Administered 2017-05-16: 10 mg via INTRAVENOUS

## 2017-05-16 MED ORDER — FENTANYL CITRATE (PF) 100 MCG/2ML IJ SOLN
INTRAMUSCULAR | Status: AC
  Filled 2017-05-16: qty 2

## 2017-05-16 MED ORDER — SUGAMMADEX SODIUM 200 MG/2ML IV SOLN
INTRAVENOUS | Status: DC | PRN
  Administered 2017-05-16: 200 mg via INTRAVENOUS

## 2017-05-16 MED ORDER — ROCURONIUM BROMIDE 10 MG/ML (PF) SYRINGE
PREFILLED_SYRINGE | INTRAVENOUS | Status: AC
  Filled 2017-05-16: qty 5

## 2017-05-16 MED ORDER — PROMETHAZINE HCL 25 MG/ML IJ SOLN: 6.2500 mg | INTRAMUSCULAR | Status: DC | PRN

## 2017-05-16 MED ORDER — PROPOFOL 10 MG/ML IV BOLUS
INTRAVENOUS | Status: AC
  Filled 2017-05-16: qty 20

## 2017-05-16 MED ORDER — LIDOCAINE 2% (20 MG/ML) 5 ML SYRINGE
INTRAMUSCULAR | Status: AC
  Filled 2017-05-16: qty 5

## 2017-05-16 MED ORDER — CHLORHEXIDINE GLUCONATE CLOTH 2 % EX PADS: 6.0000 | MEDICATED_PAD | Freq: Once | CUTANEOUS | Status: DC

## 2017-05-16 MED ORDER — EPINEPHRINE PF 1 MG/ML IJ SOLN
INTRAMUSCULAR | Status: DC | PRN
  Administered 2017-05-16: 1 mg

## 2017-05-16 MED ORDER — ONDANSETRON HCL 4 MG/2ML IJ SOLN
INTRAMUSCULAR | Status: AC
  Filled 2017-05-16: qty 2

## 2017-05-16 MED ORDER — METHYLENE BLUE 0.5 % INJ SOLN
INTRAVENOUS | Status: AC
  Filled 2017-05-16: qty 10

## 2017-05-16 MED ORDER — LIDOCAINE HCL (CARDIAC) 20 MG/ML IV SOLN
INTRAVENOUS | Status: DC | PRN
  Administered 2017-05-16: 60 mg via INTRATRACHEAL

## 2017-05-16 MED ORDER — ROCURONIUM BROMIDE 100 MG/10ML IV SOLN
INTRAVENOUS | Status: DC | PRN
  Administered 2017-05-16: 30 mg via INTRAVENOUS

## 2017-05-16 MED ORDER — PROPOFOL 10 MG/ML IV BOLUS
INTRAVENOUS | Status: DC | PRN
  Administered 2017-05-16: 100 mg via INTRAVENOUS

## 2017-05-16 MED ORDER — SUCCINYLCHOLINE CHLORIDE 20 MG/ML IJ SOLN
INTRAMUSCULAR | Status: DC | PRN
  Administered 2017-05-16: 100 mg via INTRAVENOUS

## 2017-05-16 MED ORDER — FENTANYL CITRATE (PF) 100 MCG/2ML IJ SOLN: 25.0000 ug | INTRAMUSCULAR | Status: DC | PRN

## 2017-05-16 MED ORDER — FENTANYL CITRATE (PF) 100 MCG/2ML IJ SOLN
50.0000 ug | INTRAMUSCULAR | Status: DC | PRN
  Administered 2017-05-16: 50 ug via INTRAVENOUS

## 2017-05-16 MED ORDER — MIDAZOLAM HCL 2 MG/2ML IJ SOLN: 1.0000 mg | INTRAMUSCULAR | Status: DC | PRN

## 2017-05-16 MED ORDER — DEXAMETHASONE SODIUM PHOSPHATE 10 MG/ML IJ SOLN
INTRAMUSCULAR | Status: AC
  Filled 2017-05-16: qty 1

## 2017-05-16 MED ORDER — ONDANSETRON HCL 4 MG/2ML IJ SOLN
INTRAMUSCULAR | Status: DC | PRN
  Administered 2017-05-16: 4 mg via INTRAVENOUS

## 2017-05-16 MED ORDER — LACTATED RINGERS IV SOLN
INTRAVENOUS | Status: DC
  Administered 2017-05-16 (×2): via INTRAVENOUS

## 2017-05-16 MED ORDER — NEOSTIGMINE METHYLSULFATE 5 MG/5ML IV SOSY
PREFILLED_SYRINGE | INTRAVENOUS | Status: AC
  Filled 2017-05-16: qty 5

## 2017-05-16 SURGICAL SUPPLY — 27 items
CANISTER SUCT 1200ML W/VALVE (MISCELLANEOUS) ×3 IMPLANT
CONT SPEC 4OZ CLIKSEAL STRL BL (MISCELLANEOUS) IMPLANT
DEPRESSOR TONGUE BLADE STERILE (MISCELLANEOUS) IMPLANT
FILTER 7/8 IN (FILTER) IMPLANT
GAUZE SPONGE 4X4 12PLY STRL LF (GAUZE/BANDAGES/DRESSINGS) ×6 IMPLANT
GLOVE ECLIPSE 6.5 STRL STRAW (GLOVE) ×3 IMPLANT
GLOVE SS BIOGEL STRL SZ 7.5 (GLOVE) ×1 IMPLANT
GLOVE SUPERSENSE BIOGEL SZ 7.5 (GLOVE) ×2
GOWN STRL REUS W/ TWL LRG LVL3 (GOWN DISPOSABLE) ×1 IMPLANT
GOWN STRL REUS W/ TWL XL LVL3 (GOWN DISPOSABLE) IMPLANT
GOWN STRL REUS W/TWL LRG LVL3 (GOWN DISPOSABLE) ×2
GOWN STRL REUS W/TWL XL LVL3 (GOWN DISPOSABLE)
GUARD TEETH (MISCELLANEOUS) ×3 IMPLANT
KIT PROLARN PLUS GEL W/NDL (Prosthesis and Implant ENT) ×3 IMPLANT
NEEDLE HYPO 18GX1.5 BLUNT FILL (NEEDLE) ×3 IMPLANT
NEEDLE SPNL 22GX7 QUINCKE BK (NEEDLE) IMPLANT
NS IRRIG 1000ML POUR BTL (IV SOLUTION) ×3 IMPLANT
PACK BASIN DAY SURGERY FS (CUSTOM PROCEDURE TRAY) IMPLANT
PATTIES SURGICAL .5 X3 (DISPOSABLE) ×3 IMPLANT
REDUCTION FITTING 1/4 IN (FILTER) IMPLANT
SHEET MEDIUM DRAPE 40X70 STRL (DRAPES) ×3 IMPLANT
SURGILUBE 2OZ TUBE FLIPTOP (MISCELLANEOUS) IMPLANT
SYR 5ML LL (SYRINGE) ×3 IMPLANT
SYR CONTROL 10ML LL (SYRINGE) IMPLANT
TOWEL OR 17X24 6PK STRL BLUE (TOWEL DISPOSABLE) ×3 IMPLANT
TUBE CONNECTING 20'X1/4 (TUBING) ×1
TUBE CONNECTING 20X1/4 (TUBING) ×2 IMPLANT

## 2017-05-16 NOTE — Op Note (Signed)
Preop/postop diagnosis: Left vocal cord paralysis Procedure: Microlaryngoscopy with injection Prolarynx Anesthesia: Gen. Estimated blood loss: Less than 5 mL Indications: 47 year old with a metastatic rectal cancer that is undergoing treatment but has developed a left vocal cord paralysis. 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 He wants to proceed with the injection. We discussed the procedure. We discussed risks, benefits, and options. All his questions are answered and consent was obtained. Procedure: Patient was taken the operative placed supine position after general endotracheal tube anesthesia with a #6 to the Dedo scope was inserted. The larynx was examined. The vocal cords looked normal without any lesions. The endotracheal tube was removed and the vocal cord on the left side was injected laterally with the pro-larynx material that medialized the vocal cord. 0.3 mL was injected. There was minimal to no bleeding. The vocal cord appeared to be about midline with the injection. The endotracheal tube was replaced. The patient was then awakened brought to recovery in stable condition counts correct

## 2017-05-16 NOTE — Transfer of Care (Signed)
Immediate Anesthesia Transfer of Care Note  Patient: Jacob [name]  Procedure(s) Performed: MICROLARYNGOSCOPY WITH VOCAL CORD INJECTION (Left Throat)  Patient Location: PACU  Anesthesia Type:General  Level of Consciousness: awake, alert  and oriented  Airway & Oxygen Therapy: Patient Spontanous Breathing and Patient connected to face mask oxygen  Post-op Assessment: Report given to RN and Post -op Vital signs reviewed and stable  Post vital signs: Reviewed and stable  Last Vitals:  Vitals:   05/16/17 1156  BP: 104/68  Pulse: 87  Temp: 36.6 C  SpO2: 97%    Last Pain:  Vitals:   05/16/17 1156  TempSrc: Oral  PainSc: 0-No pain         Complications: No apparent anesthesia complications

## 2017-05-16 NOTE — Discharge Instructions (Signed)

## 2017-05-16 NOTE — Anesthesia Preprocedure Evaluation (Signed)
Anesthesia Evaluation  Patient identified by MRN, date of birth, ID band Patient awake    Reviewed: Allergy & Precautions, NPO status , Patient's Chart, lab work & pertinent test results  Airway Mallampati: II  TM Distance: >3 FB Neck ROM: Full    Dental no notable dental hx.    Pulmonary neg pulmonary ROS,    Pulmonary exam normal breath sounds clear to auscultation       Cardiovascular negative cardio ROS Normal cardiovascular exam Rhythm:Regular Rate:Normal     Neuro/Psych negative neurological ROS  negative psych ROS   GI/Hepatic negative GI ROS, Neg liver ROS,   Endo/Other  negative endocrine ROS  Renal/GU negative Renal ROS  negative genitourinary   Musculoskeletal negative musculoskeletal ROS (+)   Abdominal   Peds negative pediatric ROS (+)  Hematology negative hematology ROS (+)   Anesthesia Other Findings   Reproductive/Obstetrics negative OB ROS                             Anesthesia Physical Anesthesia Plan  ASA: II  Anesthesia Plan: General   Post-op Pain Management:    Induction: Intravenous  PONV Risk Score and Plan: 2 and Ondansetron and Dexamethasone  Airway Management Planned: Oral ETT  Additional Equipment:   Intra-op Plan:   Post-operative Plan: Extubation in OR  Informed Consent: I have reviewed the patients History and Physical, chart, labs and discussed the procedure including the risks, benefits and alternatives for the proposed anesthesia with the patient or authorized representative who has indicated his/her understanding and acceptance.   Dental advisory given  Plan Discussed with: CRNA and Surgeon  Anesthesia Plan Comments:         Anesthesia Quick Evaluation

## 2017-05-16 NOTE — H&P (Signed)
Jacob [name] is an 47 y.o. male.   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 He wants injection  Past Medical History:  Diagnosis Date  . Allergic state 01/19/2012  . 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 Chicken pox as a child   ?  Marland Kitchen Dysrhythmia as child   HX OF IRREGULAR HB AT TIME OF TONSILLECTOMY - SURGERY WAS NOT DONE-CAN'T REMEMBER ANY OTHER DETAILS- NEVER HAD THE SURGERY.  . Elevated liver function tests 01/19/2012  . Fatty liver   . Hernia 6 months old  . History of diarrhea    better since chemo finished  . Hx of migraines   . Lung abnormality 2015  . Lung nodule 08/2013  . Lung nodule, multiple 11/29/2013  . Migraine headache as a child  . Numbness    TOES OF BOTH FEET.  Marland Kitchen Overweight(278.02) 01/19/2012  . Pain    LEFT HEEL PAIN -SEVERE ESPECIALLY AFTER SITTING OR LYING DOWN - DIFFICULT TO GET OUT OF BED AND WALK IN THE MORNINGS BECAUSE OF HEEL PAIN  . 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 Preventative health care 01/19/2012  . Radiation 03/20/12-04/27/12   Pelvis 50.4 gray Rectal cancer  . 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 Reflux 01/19/2012  . Sun-damaged skin 01/19/2012    Past Surgical History:  Procedure Laterality Date  . CHOLECYSTECTOMY N/A 09/17/2013   Procedure: CHOLECYSTECTOMY;  Surgeon: Stark Klein, MD;  Location: [facility];  Service: General;  Laterality: N/A;  . COLOSTOMY TAKEDOWN N/A 08/17/2016   Procedure: LAPAROSCOPIC ABDOMINOPERINEAL RESECTION WITH PERMANENT COLOSTOMY;  Surgeon: Leighton Ruff, MD;  Location: [facility];  Service: General;  Laterality: N/A;  . CYST EXCISION     L EAR AREA  . EUS  03/14/2012   Procedure: LOWER ENDOSCOPIC ULTRASOUND (EUS);  Surgeon: Milus Banister, MD;  Location: [facility];  Service: Endoscopy;  Laterality: N/A;  . HERNIA REPAIR  6 months old   right inguinal repair   . ILEOSTOMY CLOSURE N/A 12/07/2012   Procedure: ILEOSTOMY TAKEDOWN;  Surgeon: Leighton Ruff, MD;  Location: [facility];  Service: General;  Laterality: N/A;  . LAPAROSCOPIC LOW ANTERIOR RESECTION N/A 06/29/2012   Procedure: LAPAROSCOPIC LOW ANTERIOR RESECTION, mobilization splenic flexure,coloanal anastomosis,diverting ileostomy;  Surgeon: Leighton Ruff, MD;  Location: [facility];  Service: General;  Laterality: N/A;  . LAPAROSCOPY N/A 09/17/2013   Procedure: LAPAROSCOPY DIAGNOSTIC;  Surgeon: Stark Klein, MD;  Location: [facility];  Service: General;  Laterality: N/A;  . LIVER ULTRASOUND N/A 09/17/2013   Procedure: LIVER ULTRASOUND;  Surgeon: Stark Klein, MD;  Location: [facility];  Service: General;  Laterality: N/A;  . OPEN HEPATECTOMY  N/A 09/17/2013   Procedure: OPEN HEPATECTOMY;  Surgeon: Stark Klein, MD;  Location: [facility];  Service: General;  Laterality: N/A;  . PORTACATH PLACEMENT Left 03/21/2013   Procedure: INSERTION PORT-A-CATH;  Surgeon: Leighton Ruff, MD;  Location: [facility];  Service: General;  Laterality: Left;  . RECTAL BIOPSY  03/06/12   distal mass1-2cm from anal verge=Adenocarcinoma    Family History  Adopted: Yes  Problem Relation Age of Onset  . Cancer Father   . Heart disease Father   . Colon cancer Neg Hx    Social History:  reports that  has never smoked. he has never used smokeless tobacco. He reports that he drinks alcohol. He reports that  he does not use drugs.  Allergies:  Allergies  Allergen Reactions  . Penicillins Rash    Childhood allergy Has patient had a PCN reaction causing immediate rash, facial/tongue/throat swelling, SOB or lightheadedness with hypotension: Yes Has patient had a PCN reaction causing severe rash involving mucus membranes or skin necrosis: Unknown Has patient had a PCN reaction that required hospitalization: No Has patient had a PCN reaction occurring within the last 10 years: No If all of the above answers are "NO", then may proceed with Cephalosporin  use.     Medications Prior to Admission  Medication Sig Dispense Refill  . ALPRAZolam (XANAX) 0.25 MG tablet Take 1 tablet (0.25 mg total) by mouth at bedtime as needed for anxiety. 30 tablet 1  . minocycline (MINOCIN,DYNACIN) 100 MG capsule TAKE 1 CAPSULE BY MOUTH TWICE DAILY 60 capsule 2  . MYRBETRIQ 25 MG TB24 tablet     . naproxen sodium (ALEVE) 220 MG tablet Take 220 mg by mouth daily as needed (for headache). Reported on 08/28/2015    . oxyCODONE (OXY IR/ROXICODONE) 5 MG immediate release tablet Take 1 tablet (5 mg total) by mouth every 6 (six) hours as needed for severe pain. 30 tablet 0  . Probiotic Product (SOLUBLE FIBER/PROBIOTICS PO) Take 1 capsule by mouth 3 (three) times a week.     . prochlorperazine (COMPAZINE) 10 MG tablet Take 1 tablet (10 mg total) by mouth every 6 (six) hours as needed for nausea or vomiting. 30 tablet 0  . lidocaine-prilocaine (EMLA) cream Apply 1 application topically as needed. Apply to port site 1 hour prior to use. 30 g 3    No results found for this or any previous visit (from the past 48 hour(s)). No results found.  Review of Systems  Constitutional: Negative.   HENT: Negative.   Eyes: Negative.   Cardiovascular: Negative.   Skin: Negative.     Blood pressure 104/68, pulse 87, temperature 97.8 F (36.6 C), temperature source Oral, height 5\' 10"  (1.778 m), weight 62.4 kg (137 lb 8 oz), SpO2 97 %. Physical Exam  Constitutional: He appears well-developed and well-nourished.  HENT:  Head: Normocephalic and atraumatic.  Nose: Nose normal.  Mouth/Throat: Oropharynx is clear and moist.  Eyes: Conjunctivae are normal. Pupils are equal, round, and reactive to light.  Neck: Normal range of motion. Neck supple.  Cardiovascular: Normal rate.  Respiratory: Effort normal.  GI: Soft.  Musculoskeletal: Normal range of motion.     Assessment/Plan Left vocal cord- we discussed the vocal ocrd injection and he is ready to proceed  Melissa Montane,  MD 05/16/2017, 12:59 PM

## 2017-05-16 NOTE — Anesthesia Procedure Notes (Signed)
Procedure Name: Intubation Performed by: Verita Lamb, CRNA Pre-anesthesia Checklist: Patient identified, Emergency Drugs available, Suction available, Patient being monitored and Timeout performed Patient Re-evaluated:Patient Re-evaluated prior to induction Oxygen Delivery Method: Circle system utilized Preoxygenation: Pre-oxygenation with 100% oxygen Induction Type: IV induction Ventilation: Mask ventilation without difficulty Laryngoscope Size: Mac and 3 Grade View: Grade I Tube type: Oral Tube size: 6.0 mm Number of attempts: 1 Airway Equipment and Method: Stylet Placement Confirmation: positive ETCO2,  CO2 detector,  breath sounds checked- equal and bilateral and ETT inserted through vocal cords under direct vision Tube secured with: Tape Dental Injury: Teeth and Oropharynx as per pre-operative assessment

## 2017-05-16 NOTE — Anesthesia Postprocedure Evaluation (Signed)
Anesthesia Post Note  Patient: Jacob [name]  Procedure(s) Performed: MICROLARYNGOSCOPY WITH VOCAL CORD INJECTION (Left Throat)     Patient location during evaluation: PACU Anesthesia Type: General Level of consciousness: awake and alert Pain management: pain level controlled Vital Signs Assessment: post-procedure vital signs reviewed and stable Respiratory status: spontaneous breathing, nonlabored ventilation, respiratory function stable and patient connected to nasal cannula oxygen Cardiovascular status: blood pressure returned to baseline and stable Postop Assessment: no apparent nausea or vomiting Anesthetic complications: no    Last Vitals:  Vitals:   05/16/17 1430 05/16/17 1443  BP: 127/89 122/85  Pulse: 81 74  Resp: 17 15  Temp:    SpO2: 100% 100%    Last Pain:  Vitals:   05/16/17 1408  TempSrc:   PainSc: 0-No pain                 Izack Hoogland S

## 2017-05-17 ENCOUNTER — Ambulatory Visit
Admission: RE | Admit: 2017-05-17 | Discharge: 2017-05-17 | Disposition: A | Discharge status: Home / Self Care | Payer: BC Managed Care – PPO | Source: Ambulatory Visit | Attending: Radiation Oncology | Admitting: Radiation Oncology

## 2017-05-17 ENCOUNTER — Encounter (HOSPITAL_BASED_OUTPATIENT_CLINIC_OR_DEPARTMENT_OTHER): Payer: Self-pay | Admitting: Otolaryngology

## 2017-05-17 DIAGNOSIS — Z510 Encounter for antineoplastic radiation therapy: Secondary | ICD-10-CM | POA: Diagnosis not present

## 2017-05-18 ENCOUNTER — Ambulatory Visit: Payer: BC Managed Care – PPO | Admitting: Radiation Oncology

## 2017-05-18 ENCOUNTER — Ambulatory Visit
Admission: RE | Admit: 2017-05-18 | Discharge: 2017-05-18 | Disposition: A | Discharge status: Home / Self Care | Payer: BC Managed Care – PPO | Source: Ambulatory Visit | Attending: Radiation Oncology | Admitting: Radiation Oncology

## 2017-05-18 DIAGNOSIS — Z510 Encounter for antineoplastic radiation therapy: Secondary | ICD-10-CM | POA: Diagnosis not present

## 2017-05-19 ENCOUNTER — Ambulatory Visit
Admission: RE | Admit: 2017-05-19 | Discharge: 2017-05-19 | Disposition: A | Discharge status: Home / Self Care | Payer: BC Managed Care – PPO | Source: Ambulatory Visit | Attending: Radiation Oncology | Admitting: Radiation Oncology

## 2017-05-19 ENCOUNTER — Ambulatory Visit: Payer: BC Managed Care – PPO | Admitting: Radiation Oncology

## 2017-05-19 DIAGNOSIS — Z510 Encounter for antineoplastic radiation therapy: Secondary | ICD-10-CM | POA: Diagnosis not present

## 2017-05-20 ENCOUNTER — Ambulatory Visit: Payer: BC Managed Care – PPO | Admitting: Radiation Oncology

## 2017-05-22 ENCOUNTER — Other Ambulatory Visit: Payer: Self-pay | Admitting: Oncology

## 2017-05-23 ENCOUNTER — Ambulatory Visit: Payer: BC Managed Care – PPO | Admitting: Radiation Oncology

## 2017-05-24 ENCOUNTER — Ambulatory Visit
Admission: RE | Admit: 2017-05-24 | Discharge: 2017-05-24 | Disposition: A | Discharge status: Home / Self Care | Payer: BC Managed Care – PPO | Source: Ambulatory Visit | Attending: Radiation Oncology | Admitting: Radiation Oncology

## 2017-05-24 ENCOUNTER — Telehealth: Payer: Self-pay | Admitting: *Deleted

## 2017-05-24 DIAGNOSIS — Z79899 Other long term (current) drug therapy: Secondary | ICD-10-CM | POA: Diagnosis not present

## 2017-05-24 DIAGNOSIS — C7971 Secondary malignant neoplasm of right adrenal gland: Secondary | ICD-10-CM | POA: Insufficient documentation

## 2017-05-24 DIAGNOSIS — Z510 Encounter for antineoplastic radiation therapy: Secondary | ICD-10-CM | POA: Diagnosis present

## 2017-05-24 NOTE — Telephone Encounter (Signed)
Okay, schedule 06/03/2017 with me or Lattie Haw  Also could see him in the afternoon on 06/01/2017

## 2017-05-24 NOTE — Telephone Encounter (Signed)
Message from pt requesting to cancel 3/7 appointments and reschedule after his 3/14 visit at [facility]. Message to MD for review.

## 2017-05-24 NOTE — Telephone Encounter (Signed)
Returned call to pt, informed him appts have been canceled for this week. Schedulers will call with appt for 3/15 to discuss [facility] visit. Pt voiced understanding.

## 2017-05-25 ENCOUNTER — Ambulatory Visit
Admission: RE | Admit: 2017-05-25 | Discharge: 2017-05-25 | Disposition: A | Discharge status: Home / Self Care | Payer: BC Managed Care – PPO | Source: Ambulatory Visit | Attending: Radiation Oncology | Admitting: Radiation Oncology

## 2017-05-25 ENCOUNTER — Telehealth: Payer: Self-pay | Admitting: Oncology

## 2017-05-25 DIAGNOSIS — C7971 Secondary malignant neoplasm of right adrenal gland: Secondary | ICD-10-CM | POA: Diagnosis not present

## 2017-05-25 NOTE — Telephone Encounter (Signed)
Scheduled appt per 3/5 sch message - Patient is aware of appt date and time.

## 2017-05-26 ENCOUNTER — Ambulatory Visit: Payer: BC Managed Care – PPO | Admitting: Radiation Oncology

## 2017-05-26 ENCOUNTER — Ambulatory Visit: Payer: BC Managed Care – PPO | Admitting: Oncology

## 2017-05-26 ENCOUNTER — Other Ambulatory Visit: Payer: BC Managed Care – PPO

## 2017-05-26 ENCOUNTER — Ambulatory Visit: Payer: BC Managed Care – PPO

## 2017-05-26 ENCOUNTER — Ambulatory Visit
Admission: RE | Admit: 2017-05-26 | Discharge: 2017-05-26 | Disposition: A | Discharge status: Home / Self Care | Payer: BC Managed Care – PPO | Source: Ambulatory Visit | Attending: Radiation Oncology | Admitting: Radiation Oncology

## 2017-05-26 DIAGNOSIS — C7971 Secondary malignant neoplasm of right adrenal gland: Secondary | ICD-10-CM | POA: Diagnosis not present

## 2017-05-27 ENCOUNTER — Ambulatory Visit: Payer: BC Managed Care – PPO | Admitting: Radiation Oncology

## 2017-05-30 ENCOUNTER — Ambulatory Visit
Admission: RE | Admit: 2017-05-30 | Discharge: 2017-05-30 | Disposition: A | Discharge status: Home / Self Care | Payer: BC Managed Care – PPO | Source: Ambulatory Visit | Attending: Radiation Oncology | Admitting: Radiation Oncology

## 2017-05-30 ENCOUNTER — Ambulatory Visit: Payer: BC Managed Care – PPO | Admitting: Radiation Oncology

## 2017-05-30 DIAGNOSIS — C7971 Secondary malignant neoplasm of right adrenal gland: Secondary | ICD-10-CM | POA: Diagnosis not present

## 2017-05-31 ENCOUNTER — Ambulatory Visit
Admission: RE | Admit: 2017-05-31 | Discharge: 2017-05-31 | Disposition: A | Discharge status: Home / Self Care | Payer: BC Managed Care – PPO | Source: Ambulatory Visit | Attending: Radiation Oncology | Admitting: Radiation Oncology

## 2017-05-31 ENCOUNTER — Encounter: Payer: Self-pay | Admitting: Radiation Oncology

## 2017-05-31 DIAGNOSIS — C7971 Secondary malignant neoplasm of right adrenal gland: Secondary | ICD-10-CM | POA: Diagnosis not present

## 2017-05-31 NOTE — Progress Notes (Signed)
  Radiation Oncology         [phone] ________________________________  Name: Jacob [name] MRN: [id number]  Date: 05/31/2017  DOB: May 18, 1970  SIMULATION AND TREATMENT PLANNING NOTE  DIAGNOSIS:     ICD-10-CM   1. 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  Identity was confirmed.  All relevant records and images related to the planned course of therapy were reviewed.   Written consent to proceed with treatment was confirmed which was freely given after reviewing the details related to the planned course of therapy had been reviewed with the patient.  Then, the patient was set-up in a stable reproducible  supine position for radiation therapy.  CT images were obtained.  Surface markings were placed.    Medically necessary complex treatment device(s) for immobilization:  1.  Customized blue bag/Vac-Lok bag.  2.  Customized Accu form device  The CT images were loaded into the planning software.  Then the target and avoidance structures were contoured.  Treatment planning then occurred.  The radiation prescription was entered and confirmed.  A total of 3 complex treatment devices were fabricated which relate to the designed radiation treatment fields. Each of these customized fields/ complex treatment devices will be used on a daily basis during the radiation course. I have requested : Intensity Modulated Radiotherapy (IMRT) is medically necessary for this case for the following reason: Sparing of adjacent critical normal structures including the bowel, liver, and right kidney.   The patient will undergo daily image guidance to ensure accurate localization of the target, and adequate minimize dose to the normal surrounding structures in close proximity to the target.   PLAN:  The patient will receive 40 Gy in 8  fractions.  ________________________________   Jodelle Gross, MD, PhD

## 2017-06-03 ENCOUNTER — Inpatient Hospital Stay: Payer: BC Managed Care – PPO | Attending: Oncology | Admitting: Oncology

## 2017-06-03 VITALS — BP 121/76 | HR 97 | Temp 98.6°F | Resp 18 | Ht 70.0 in | Wt 145.4 lb

## 2017-06-03 DIAGNOSIS — C20 Malignant neoplasm of rectum: Secondary | ICD-10-CM | POA: Diagnosis present

## 2017-06-03 DIAGNOSIS — R52 Pain, unspecified: Secondary | ICD-10-CM

## 2017-06-03 DIAGNOSIS — C787 Secondary malignant neoplasm of liver and intrahepatic bile duct: Secondary | ICD-10-CM | POA: Insufficient documentation

## 2017-06-03 DIAGNOSIS — M542 Cervicalgia: Secondary | ICD-10-CM | POA: Diagnosis not present

## 2017-06-03 DIAGNOSIS — E279 Disorder of adrenal gland, unspecified: Secondary | ICD-10-CM | POA: Diagnosis not present

## 2017-06-03 DIAGNOSIS — R911 Solitary pulmonary nodule: Secondary | ICD-10-CM | POA: Diagnosis not present

## 2017-06-03 DIAGNOSIS — M545 Low back pain: Secondary | ICD-10-CM | POA: Diagnosis not present

## 2017-06-03 NOTE — Progress Notes (Signed)
[facility] OFFICE PROGRESS NOTE   Diagnosis: Rectal cancer  INTERVAL HISTORY:   Jacob [name] returns for a scheduled visit.  He completed palliative radiation to the adrenal mass from 05/17/2017 through 05/31/2017.  He reports improvement in the right sided back pain for the past several days. He complains of pain at the left neck, skull, and jaw.  The pain is intermittent has been present since he underwent the colostomy procedure.  He had an episode of constipation and increased pain during the course of radiation.  He underwent a micro laryngoscopy with injection of prolarynx by Dr. Janace Hoard on 05/16/2017.  He noted initial marked improvement in his voice with worsening after several days.  His voice remains better than prior to the procedure.  He plans to follow-up with Dr. Janace Hoard.  He saw Dr. Altamease Oiler yesterday to discuss systemic treatment options.  He reports discussing oxaliplatin based therapy and clinical trials.  He was given information on a phase 1B study with a ERKi inhibitor and palbociclib.  Objective:  Vital signs in last 24 hours:  Blood pressure 121/76, pulse 97, temperature 98.6 F (37 C), temperature source Oral, resp. rate 18, height '5\' 10"'  (1.778 m), weight 145 lb 6.4 oz (66 kg), SpO2 100 %.    HEENT: Neck without tenderness or mass. Resp: Distant breath sounds, no respiratory distress Cardio: Regular rate and rhythm GI: No hepatospleno megaly, no mass, nontender Vascular: No leg edema  Skin: Resolving Panitumumab rash  Portacath/PICC-without erythema  Lab Results:  Lab Results  Component Value Date   WBC 5.0 05/05/2017   HGB 11.9 (L) 04/21/2017   HCT 37.7 (L) 05/05/2017   MCV 86.9 05/05/2017   PLT 149 05/05/2017   NEUTROABS 3.6 05/05/2017    CMP     Component Value Date/Time   NA 140 05/05/2017 0933   NA 142 03/24/2017 1009   K 3.7 05/05/2017 0933   K 4.0 03/24/2017 1009   CL 105 05/05/2017 0933   CL 108 (H) 08/30/2012 0903   CO2 28  05/05/2017 0933   CO2 27 03/24/2017 1009   GLUCOSE 78 05/05/2017 0933   GLUCOSE 96 03/24/2017 1009   GLUCOSE 96 08/30/2012 0903   BUN 7 05/05/2017 0933   BUN 12.2 03/24/2017 1009   CREATININE 0.85 05/05/2017 0933   CREATININE 0.7 03/24/2017 1009   CALCIUM 9.0 05/05/2017 0933   CALCIUM 8.8 03/24/2017 1009   PROT 6.4 05/05/2017 0933   PROT 6.1 (L) 03/24/2017 1009   ALBUMIN 3.8 05/05/2017 0933   ALBUMIN 3.6 03/24/2017 1009   AST 27 05/05/2017 0933   AST 19 03/24/2017 1009   ALT 17 05/05/2017 0933   ALT 14 03/24/2017 1009   ALKPHOS 110 05/05/2017 0933   ALKPHOS 67 03/24/2017 1009   BILITOT 0.3 05/05/2017 0933   BILITOT <0.22 03/24/2017 1009   GFRNONAA >60 05/05/2017 0933   GFRAA >60 05/05/2017 0933    Lab Results  Component Value Date   CEA1 40.50 (H) 05/05/2017     Medications: I have reviewed the patient's current medications.   Assessment/Plan: . Rectal cancer. Partially obstructing mass noted 1-2 cm from the anal verge on a colonoscopy 03/06/2012. Endoscopic ultrasound 03/14/2012 with a 7.5 mm thick, 3.2 cm wide hypoechoic, irregularly bordered mass that clearly passed into and through the muscularis propria layer of the distal rectal wall (uT3); 3 small (largest 7 mm) perirectal lymph nodes. The lymph nodes were all round, discrete, hypoechoic, homogenous; suspicious for malignant involvement (uN1).  No RAS mutation identified by [facility] 1 testing on the colon resection specimen 06/29/2012, APC alteration identified, microsatellite stable  He began radiation and concurrent Xeloda chemotherapy on 03/20/2012, completed 04/27/2012.   Low anterior resection/coloanal anastomosis and diverting ileostomy 06/29/2012 with the final pathology revealing a T2N0 tumor with extensive fibrosis and negative margins.   Cycle 1 of adjuvant CAPOX 07/19/2012. Cycle 5 of adjuvant CAPOX 10/11/2012.   CEA 2.5 on 11/14/2012.   CEA 14.6 03/08/2013.   Restaging CT evaluation  03/08/2013 with a 4 mm pulmonary nodule left lung base not identified on comparison exam; new liver lesions including a 29 x 26 mm irregular peripheral enhancing rounded lesion in the dome of the right hepatic lobe, a less well-defined new subcapsular lesion in the lateral right hepatic lobe measuring 12 mm, a new subcapsular lesion in the anterior right hepatic lobe adjacent to the gallbladder fossa measuring 10 mm; a rounded low-density lesion in the inferior right hepatic lobe measuring 10 mm compared to 7 mm on the prior study (radiologist commented this may represent an enlarged cyst).   MRI of the abdomen 04/12/2013 confirmed multiple T2 hyperintense metastatic lesions throughout the right liver   Initiation of FOLFIRI/Avastin with genotype based irinotecan dosing per the [facility] study 04/05/2013.   Restaging CT evaluation on 06/20/2013 (after 2 cycles/4 treatments) showed improvement in the liver metastases and stable size of a left lower lobe pulmonary nodule now with central cavitation.   Continuation of FOLFIRI/Avastin.   Restaging CT evaluation 08/14/2013-decrease in the size of liver metastases, slight decrease in the size of a cavitary left lower lobe nodule, no evidence of disease progression.   Status post right hepatic lobectomy 09/17/2013. Pathology showed multiple foci of metastatic adenocarcinoma (5 nodules of metastatic adenocarcinoma 4 of which are subcapsular with the nodules ranging in size from 0.6-1.8 cm in greatest dimension). Margins not involved. Biopsy of a portal lymph node showed benign adipose tissue; no lymph node tissue or malignancy.   Normal CEA 11/06/2013   CT 11/06/2013 with a new pleural-based right lower chest lesion, slight in enlargement of the a left-sided lung nodule  CT 01/29/2014 with a decrease in the right lower chest pleural-based lesion and a slight increase of a left-sided lung lesion, other lung lesions were stable  CT chest 05/02/2014 with  a decrease in the size of a right lower lobe pulmonary nodule months similar size of a dominant left lower lobe nodule, minimal enlargement of a smaller left lower lobe nodule, no new site of disease  CT chest 11/07/2014 with a slight increase in several left-sided lung nodules  CT chest 02/10/2015 with new small left hilar lymph nodes, a possible new left lower lobe nodule, and stability of other lung nodules  PET scan 03/12/2015 with hypermetabolic left hilar nodes, hypermetabolic left lower lobe nodules, hyper metabolic retroperitoneal nodes, intense hypermetabolism at the coloanal anastomosis, and hypermetabolic thickening in the presacral space  Cycle 1 FOLFIRI/PANITUMUMAB 05/21/2015  Cycle 2 FOLFIRI/PANITUMUMAB 06/05/2015  Cycle 3 FOLFIRI/PANITUMUMAB 06/19/2015  Cycle 4 FOLFIRI 07/10/2015  Cycle 5 FOLFIRI 07/24/2015  Restaging CTs 08/06/2015-resolution of hilar/retroperitoneal adenopathy, improvement in the hypermetabolic lung nodule, other lung nodules are stable, no new lesions  Cycle 6 FOLFIRI with PANITUMUMAB 08/28/2015 -panitumumab dose reduced  Cycle 7 FOLFIRI 09/11/2015-no panitumumab given  Cycle 8 FOLFIRI with PANITUMUMAB 09/25/2015-PANITUMUMAB dose reduced  Cycle 9 FOLFIRI 10/09/2015-no PANITUMUMAB given  Cycle 10 FOLFIRI with panitumumab 10/23/2015  CTs 11/06/2015-possible slight enlargement of a left lower lobe nodule, no other  evidence of disease progression.  CTs 02/16/2016-enlargement of left-sided pulmonary nodules and mediastinal/left hilar nodes, improved splenomegaly  CTs 06/21/2016-increased size of mediastinal/left hilar nodes, increased left lung nodules, and increased soft tissue at the porta hepatis  CTs 10/28/2016-progressive disease in the chest and abdomen with an enlarging left hilar mass and slight interval enlargement of pulmonary lesions. Right upper quadrant necrotic nodal mass significantly increased in size with mass effect on the liver and  possible invasion. Significant compression of the intrahepatic IVC. New right hepatic lobe lesion. Moderate pelvic ascites.  Cycle 1 FOLFIRI/PANITUMUMAB 11/04/2016  Cycle 2 FOLFIRI/PANITUMUMAB 11/18/2016  Cycle 3 FOLFIRI/panitumumab 12/02/2016  Cycle4 FOLFIRI/PANITUMUMAB 12/15/2016  Cycle 5 FOLFIRI/PANITUMUMAB 12/30/2016  CTs 01/06/2017-no evidence of progressive disease, decreased chest adenopathy, stable left lower lobe nodules, decreased liver metastasis, decreased right retroperitoneal mass  Cycle 6 FOLFIRI/panitumumab 01/13/2017  Cycle 7 FOLFIRI/Panitumumab 01/27/2017  Cycle 8 FOLFIRI/panitumumab 02/17/2017  Cycle 9 FOLFIRI/Panitumumab 03/03/2017  Cycle 10 FOLFIRI/Panitumumab 03/24/2017  CTs 04/06/2017-enlargement of right adrenal mass (smaller than October 2019), stable left hilar fullness and lung nodules  Cycle 11 FOLFIRI/Panitumumab 04/07/2017  Cycle 12 FOLFIRI/panitumumab 04/21/2017  Cycle 13 FOLFIRI/Panitumumab 05/05/2017  Palliative radiation to the right adrenal mass 226 2019-3 02/2018 2. History ofIrregular bowel habits/rectal bleeding secondary to #1. 3. History of Mild elevation of the liver enzymes. Question secondary to hepatic steatosis. 4. Indeterminate 8 mm posterior right liver lesion on the staging CT 03/06/2012. 5. Mildly elevated CEA at 5.7 on 03/06/2012. 6. History of radiation erythema at the groin and perineum 7. 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 No clinical evidence of thrombophlebitis or extravasation. 8. Delayed nausea following chemotherapy-Decadron prophylaxis was added with cycle 3 CAPOX-improved. 9. Ileostomy takedown 12/07/2012. 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 11. Port-A-Cath placement 12/31/204 12. Severe neutropenia secondary to chemotherapy following cycle 1 of FOLFIRI, chemotherapy was dose reduced and he received Neulasta with day 15  cycle 1  13. Nausea and vomiting following cycle 1 of FOLFIRI 14. Rectal stricture-manual/colonoscopic dilatation by Dr. Ardis Hughs 03/01/2016  APR 08/17/2016-no evidence of malignancy 15. History ofLeukopenia/Thrombocytopenia-persistent, potentially a sequelae of chemotherapy or hepatic toxicity from chemotherapy/radiation. Bone marrow biopsy 11/20/2014 showed cellular bone marrow with trilineage hematopoiesis. Significant dyspoiesis was not present and there was no evidence of metastatic carcinoma. Cytogenetic analysis showed the presence of normal male chromosomes with no observable clonal chromosomal abnormalities.  probable cirrhosis 16. Genetic testing-negative genetic panel in March 2014 17. 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 Severe over the face-steroid Dosepak prescribed 07/03/2015. Improved 07/10/2015. Further improved 07/24/2015, 08/07/2015, 08/28/2015 18. Diarrhea secondary to chemotherapy-encouraged to use Imodium  19. History of Paronychia secondary to PANITUMUMAB 20. 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 Mizer has experienced partial improvement in the right-sided back pain following palliative radiation.  He now has pain at the left neck and head.  The etiology of this pain is unclear.  The pain is been present for the past year.  We decided to hold on x-ray evaluation of the head and neck for now.  I discussed systemic treatment options with Mr. Steuber and his wife.  We discussed the [facility] study, continuing FOLFIRI/panitumumab, and oxaliplatin based therapy.  There has been no evidence of disease progression other than the right adrenal lesion.  We decided to continue FOLFIRI/panitumumab beginning 06/09/2017.  He does not wish to enroll on the clinical trial at present.  We  will plan for a restaging CT evaluation to include a neck CT after 3-4 cycles of  treatment.  We will check a CEA when he returns on 06/09/2017.  30 minutes were spent with the patient today.  The majority of the time was used for counseling and coordination of care.  Jacob Coder, MD  06/03/2017  1:01 PM

## 2017-06-06 ENCOUNTER — Telehealth: Payer: Self-pay | Admitting: Oncology

## 2017-06-06 NOTE — Telephone Encounter (Signed)
Scheduled appt per 3/15 los - unable to add patient in for 3/21 due to capped day - logged and will contact patient when appt is added.

## 2017-06-09 ENCOUNTER — Other Ambulatory Visit: Payer: BC Managed Care – PPO

## 2017-06-09 ENCOUNTER — Ambulatory Visit: Payer: BC Managed Care – PPO

## 2017-06-15 NOTE — Progress Notes (Signed)
  Radiation Oncology         [phone] ________________________________  Name: Jacob [name] MRN: [id number]  Date: 05/31/2017  DOB: 05-27-1970  End of Treatment Note  Diagnosis:   47 y.o. 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  Narrative: The patient tolerated radiation treatment relatively well.   His pain has significantly improved. He experienced mild fatigue and had some constipation. He otherwise denied any bowel or urinary issues. He also noted some nausea, vomiting, and skin issues but believes this was related to his concurrent chemotherapy.  Plan: The patient has completed radiation treatment. The patient will return to radiation oncology clinic for routine followup in one month. I advised the patient to call or return sooner if they have any questions or concerns related to their recovery or treatment. ________________________________  Jodelle Gross, MD, PhD  This document serves as a record of services personally performed by Kyung Rudd, MD. It was created on his behalf by Rae Lips, a trained medical scribe. The creation of this record is based on the scribe's personal observations and the provider's statements to them. This document has been checked and approved by the attending provider.

## 2017-06-19 ENCOUNTER — Other Ambulatory Visit: Payer: Self-pay | Admitting: Oncology

## 2017-06-21 ENCOUNTER — Encounter: Payer: Self-pay | Admitting: Pharmacist

## 2017-06-23 ENCOUNTER — Inpatient Hospital Stay: Payer: BC Managed Care – PPO

## 2017-06-23 ENCOUNTER — Encounter: Payer: Self-pay | Admitting: Nurse Practitioner

## 2017-06-23 ENCOUNTER — Inpatient Hospital Stay: Payer: BC Managed Care – PPO | Attending: Oncology | Admitting: Nurse Practitioner

## 2017-06-23 ENCOUNTER — Telehealth: Payer: Self-pay

## 2017-06-23 VITALS — BP 114/74 | HR 88 | Temp 98.5°F | Resp 18 | Ht 70.0 in | Wt 146.8 lb

## 2017-06-23 DIAGNOSIS — Z95828 Presence of other vascular implants and grafts: Secondary | ICD-10-CM

## 2017-06-23 DIAGNOSIS — C787 Secondary malignant neoplasm of liver and intrahepatic bile duct: Secondary | ICD-10-CM | POA: Diagnosis present

## 2017-06-23 DIAGNOSIS — C20 Malignant neoplasm of rectum: Secondary | ICD-10-CM | POA: Diagnosis present

## 2017-06-23 DIAGNOSIS — D696 Thrombocytopenia, unspecified: Secondary | ICD-10-CM

## 2017-06-23 DIAGNOSIS — R911 Solitary pulmonary nodule: Secondary | ICD-10-CM

## 2017-06-23 DIAGNOSIS — D709 Neutropenia, unspecified: Secondary | ICD-10-CM

## 2017-06-23 DIAGNOSIS — E279 Disorder of adrenal gland, unspecified: Secondary | ICD-10-CM | POA: Diagnosis not present

## 2017-06-23 LAB — CBC WITH DIFFERENTIAL (CANCER CENTER ONLY)
Basophils Absolute: 0 10*3/uL (ref 0.0–0.1)
Basophils Relative: 1 %
Eosinophils Absolute: 0.1 10*3/uL (ref 0.0–0.5)
Eosinophils Relative: 7 %
HEMATOCRIT: 35.4 % — AB (ref 38.4–49.9)
Hemoglobin: 11.3 g/dL — ABNORMAL LOW (ref 13.0–17.1)
LYMPHS ABS: 0.3 10*3/uL — AB (ref 0.9–3.3)
LYMPHS PCT: 17 %
MCH: 29.1 pg (ref 27.2–33.4)
MCHC: 31.9 g/dL — AB (ref 32.0–36.0)
MCV: 91.2 fL (ref 79.3–98.0)
MONO ABS: 0.2 10*3/uL (ref 0.1–0.9)
MONOS PCT: 10 %
NEUTROS ABS: 1.2 10*3/uL — AB (ref 1.5–6.5)
Neutrophils Relative %: 65 %
Platelet Count: 73 10*3/uL — ABNORMAL LOW (ref 140–400)
RBC: 3.88 MIL/uL — ABNORMAL LOW (ref 4.20–5.82)
RDW: 18.3 % — ABNORMAL HIGH (ref 11.0–14.6)
WBC Count: 1.8 10*3/uL — ABNORMAL LOW (ref 4.0–10.3)

## 2017-06-23 LAB — CMP (CANCER CENTER ONLY)
ALBUMIN: 3.5 g/dL (ref 3.5–5.0)
ALT: 16 U/L (ref 0–55)
AST: 19 U/L (ref 5–34)
Alkaline Phosphatase: 71 U/L (ref 40–150)
Anion gap: 6 (ref 3–11)
BUN: 12 mg/dL (ref 7–26)
CHLORIDE: 110 mmol/L — AB (ref 98–109)
CO2: 26 mmol/L (ref 22–29)
Calcium: 8.9 mg/dL (ref 8.4–10.4)
Creatinine: 0.78 mg/dL (ref 0.70–1.30)
GFR, Est AFR Am: >60 mL/min (ref >60)
GFR, Estimated: >60 mL/min (ref >60)
GLUCOSE: 77 mg/dL (ref 70–140)
Potassium: 3.7 mmol/L (ref 3.5–5.1)
SODIUM: 142 mmol/L (ref 136–145)
Total Bilirubin: 0.6 mg/dL (ref 0.2–1.2)
Total Protein: 6 g/dL — ABNORMAL LOW (ref 6.4–8.3)

## 2017-06-23 LAB — CEA (IN HOUSE-CHCC): CEA (CHCC-In House): 12.38 ng/mL — ABNORMAL HIGH (ref 0.00–5.00)

## 2017-06-23 LAB — MAGNESIUM: Magnesium: 2.3 mg/dL (ref 1.5–2.5)

## 2017-06-23 MED ORDER — SODIUM CHLORIDE 0.9 % IJ SOLN
10.0000 mL | INTRAMUSCULAR | Status: DC | PRN
  Administered 2017-06-23: 10 mL via INTRAVENOUS
  Filled 2017-06-23: qty 10

## 2017-06-23 MED ORDER — HEPARIN SOD (PORK) LOCK FLUSH 100 UNIT/ML IV SOLN
500.0000 [IU] | Freq: Once | INTRAVENOUS | Status: AC | PRN
  Administered 2017-06-23: 500 [IU] via INTRAVENOUS
  Filled 2017-06-23: qty 5

## 2017-06-23 NOTE — Progress Notes (Signed)
[place] OFFICE PROGRESS NOTE   Diagnosis: Rectal cancer  INTERVAL HISTORY:   Mr. Crumpacker returns for follow-up.  He is scheduled to resume treatment with FOLFIRI/Panitumumab today.  He states that he feels "good".  He is having regular bowel movements.  Skin has mostly healed.  He has a good appetite.  He is no longer having right flank pain.  Left neck/ear pain is better.  He thinks the hoarseness may be worse.  Objective:  Vital signs in last 24 hours:  Blood pressure 114/74, pulse 88, temperature 98.5 F (36.9 C), temperature source Oral, resp. rate 18, height '5\' 10"'  (1.778 m), weight 146 lb 12.8 oz (66.6 kg), SpO2 100 %.    HEENT: No thrush or ulcers. Resp: Lungs clear bilaterally. Cardio: Regular rate and rhythm. GI: Abdomen soft and nontender.  No hepatomegaly.  Left lower quadrant colostomy. Vascular: No leg edema. Neuro: Alert and oriented. Skin: Skin in general is dry.  A few acne type lesions on the chest. Port-A-Cath without erythema.   Lab Results:  Lab Results  Component Value Date   WBC 1.8 (L) 06/23/2017   HGB 11.9 (L) 04/21/2017   HCT 35.4 (L) 06/23/2017   MCV 91.2 06/23/2017   PLT 73 (L) 06/23/2017   NEUTROABS 1.2 (L) 06/23/2017    Imaging:  No results found.  Medications: I have reviewed the patient's current medications.  Assessment/Plan: 1. Rectal cancer. Partially obstructing mass noted 1-2 cm from the anal verge on a colonoscopy 03/06/2012. Endoscopic ultrasound 03/14/2012 with a 7.5 mm thick, 3.2 cm wide hypoechoic, irregularly bordered mass that clearly passed into and through the muscularis propria layer of the distal rectal wall (uT3); 3 small (largest 7 mm) perirectal lymph nodes. The lymph nodes were all round, discrete, hypoechoic, homogenous; suspicious for malignant involvement (uN1).  No RAS mutation identified by [facility] 1 testing on the colon resection specimen 06/29/2012, APC alteration identified,  microsatellite stable  He began radiation and concurrent Xeloda chemotherapy on 03/20/2012, completed 04/27/2012.   Low anterior resection/coloanal anastomosis and diverting ileostomy 06/29/2012 with the final pathology revealing a T2N0 tumor with extensive fibrosis and negative margins.   Cycle 1 of adjuvant CAPOX 07/19/2012. Cycle 5 of adjuvant CAPOX 10/11/2012.   CEA 2.5 on 11/14/2012.   CEA 14.6 03/08/2013.   Restaging CT evaluation 03/08/2013 with a 4 mm pulmonary nodule left lung base not identified on comparison exam; new liver lesions including a 29 x 26 mm irregular peripheral enhancing rounded lesion in the dome of the right hepatic lobe, a less well-defined new subcapsular lesion in the lateral right hepatic lobe measuring 12 mm, a new subcapsular lesion in the anterior right hepatic lobe adjacent to the gallbladder fossa measuring 10 mm; a rounded low-density lesion in the inferior right hepatic lobe measuring 10 mm compared to 7 mm on the prior study (radiologist commented this may represent an enlarged cyst).   MRI of the abdomen 04/12/2013 confirmed multiple T2 hyperintense metastatic lesions throughout the right liver   Initiation of FOLFIRI/Avastin with genotype based irinotecan dosing per the [facility] study 04/05/2013.   Restaging CT evaluation on 06/20/2013 (after 2 cycles/4 treatments) showed improvement in the liver metastases and stable size of a left lower lobe pulmonary nodule now with central cavitation.   Continuation of FOLFIRI/Avastin.   Restaging CT evaluation 08/14/2013-decrease in the size of liver metastases, slight decrease in the size of a cavitary left lower lobe nodule, no evidence of disease progression.   Status post right  hepatic lobectomy 09/17/2013. Pathology showed multiple foci of metastatic adenocarcinoma (5 nodules of metastatic adenocarcinoma 4 of which are subcapsular with the nodules ranging in size from 0.6-1.8 cm in greatest dimension).  Margins not involved. Biopsy of a portal lymph node showed benign adipose tissue; no lymph node tissue or malignancy.   Normal CEA 11/06/2013   CT 11/06/2013 with a new pleural-based right lower chest lesion, slight in enlargement of the a left-sided lung nodule  CT 01/29/2014 with a decrease in the right lower chest pleural-based lesion and a slight increase of a left-sided lung lesion, other lung lesions were stable  CT chest 05/02/2014 with a decrease in the size of a right lower lobe pulmonary nodule months similar size of a dominant left lower lobe nodule, minimal enlargement of a smaller left lower lobe nodule, no new site of disease  CT chest 11/07/2014 with a slight increase in several left-sided lung nodules  CT chest 02/10/2015 with new small left hilar lymph nodes, a possible new left lower lobe nodule, and stability of other lung nodules  PET scan 03/12/2015 with hypermetabolic left hilar nodes, hypermetabolic left lower lobe nodules, hyper metabolic retroperitoneal nodes, intense hypermetabolism at the coloanal anastomosis, and hypermetabolic thickening in the presacral space  Cycle 1 FOLFIRI/PANITUMUMAB 05/21/2015  Cycle 2 FOLFIRI/PANITUMUMAB 06/05/2015  Cycle 3 FOLFIRI/PANITUMUMAB 06/19/2015  Cycle 4 FOLFIRI 07/10/2015  Cycle 5 FOLFIRI 07/24/2015  Restaging CTs 08/06/2015-resolution of hilar/retroperitoneal adenopathy, improvement in the hypermetabolic lung nodule, other lung nodules are stable, no new lesions  Cycle 6 FOLFIRI with PANITUMUMAB 08/28/2015 -panitumumab dose reduced  Cycle 7 FOLFIRI 09/11/2015-no panitumumab given  Cycle 8 FOLFIRI with PANITUMUMAB 09/25/2015-PANITUMUMAB dose reduced  Cycle 9 FOLFIRI 10/09/2015-no PANITUMUMAB given  Cycle 10 FOLFIRI with panitumumab 10/23/2015  CTs 11/06/2015-possible slight enlargement of a left lower lobe nodule, no other evidence of disease progression.  CTs 02/16/2016-enlargement of left-sided pulmonary  nodules and mediastinal/left hilar nodes, improved splenomegaly  CTs 06/21/2016-increased size of mediastinal/left hilar nodes, increased left lung nodules, and increased soft tissue at the porta hepatis  CTs 10/28/2016-progressive disease in the chest and abdomen with an enlarging left hilar mass and slight interval enlargement of pulmonary lesions. Right upper quadrant necrotic nodal mass significantly increased in size with mass effect on the liver and possible invasion. Significant compression of the intrahepatic IVC. New right hepatic lobe lesion. Moderate pelvic ascites.  Cycle 1 FOLFIRI/PANITUMUMAB 11/04/2016  Cycle 2 FOLFIRI/PANITUMUMAB 11/18/2016  Cycle 3 FOLFIRI/panitumumab 12/02/2016  Cycle4 FOLFIRI/PANITUMUMAB 12/15/2016  Cycle 5 FOLFIRI/PANITUMUMAB 12/30/2016  CTs 01/06/2017-no evidence of progressive disease, decreased chest adenopathy, stable left lower lobe nodules, decreased liver metastasis, decreased right retroperitoneal mass  Cycle 6 FOLFIRI/panitumumab 01/13/2017  Cycle 7 FOLFIRI/Panitumumab 01/27/2017  Cycle 8 FOLFIRI/panitumumab 02/17/2017  Cycle 9 FOLFIRI/Panitumumab 03/03/2017  Cycle 10 FOLFIRI/Panitumumab 03/24/2017  CTs 04/06/2017-enlargement of right adrenal mass (smaller than October 2019), stable left hilar fullness and lung nodules  Cycle 11 FOLFIRI/Panitumumab 04/07/2017  Cycle 12 FOLFIRI/panitumumab 04/21/2017  Cycle 13 FOLFIRI/Panitumumab 05/05/2017  Palliative radiation to the right adrenal mass 226 2019-3 02/2018 2. History ofIrregular bowel habits/rectal bleeding secondary to #1. 3. History of Mild elevation of the liver enzymes. Question secondary to hepatic steatosis. 4. Indeterminate 8 mm posterior right liver lesion on the staging CT 03/06/2012. 5. Mildly elevated CEA at 5.7 on 03/06/2012. 6. History of radiation erythema at the groin and perineum 7. Right hand/arm tenderness and numbness following cycle 1 oxaliplatin-likely related to a  local toxicity from oxaliplatin/neuropathy. No clinical evidence of thrombophlebitis or extravasation.  8. Delayed nausea following chemotherapy-Decadron prophylaxis was added with cycle 3 CAPOX-improved. 9. Ileostomy takedown 12/07/2012. 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 11. Port-A-Cath placement 12/31/204 12. Severe neutropenia secondary to chemotherapy following cycle 1 of FOLFIRI, chemotherapy was dose reduced and he received Neulasta with day 15 cycle 1  13. Nausea and vomiting following cycle 1 of FOLFIRI 14. Rectal stricture-manual/colonoscopic dilatation by Dr. Ardis Hughs 03/01/2016  APR 08/17/2016-no evidence of malignancy 15. History ofLeukopenia/Thrombocytopenia-persistent, potentially a sequelae of chemotherapy or hepatic toxicity from chemotherapy/radiation. Bone marrow biopsy 11/20/2014 showed cellular bone marrow with trilineage hematopoiesis. Significant dyspoiesis was not present and there was no evidence of metastatic carcinoma. Cytogenetic analysis showed the presence of normal male chromosomes with no observable clonal chromosomal abnormalities.  probable cirrhosis 16. Genetic testing-negative genetic panel in March 2014 17. 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 Severe over the face-steroid Dosepak prescribed 07/03/2015. Improved 07/10/2015. Further improved 07/24/2015, 08/07/2015, 08/28/2015 18. Diarrhea secondary to chemotherapy-encouraged to use Imodium  19. History of Paronychia secondary to PANITUMUMAB 20. 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 Depree appears stable.  The initial plan was to resume treatment today with FOLFIRI/Panitumumab. However, on labs today he is found to have a mild neutropenia and moderate thrombocytopenia.  This may be related to the recent radiation.  We decided to hold today's treatment and reschedule for 1  week.  The right back pain has resolved since completing the course of radiation.  He will return for lab, follow-up and FOLFIRI/Panitumumab in 1 week.  He will contact the office in the interim with any problems.  Patient seen with Dr. Benay Spice.  25 minutes were spent face-to-face at today's visit with the majority of that time involved in counseling/coordination of care.    Ned Card ANP/GNP-BC   06/23/2017  12:53 PM  This was a shared visit with Ned Card.  Mr. Correa completed palliative radiation to the right adrenal mass.  The back pain has resolved.  He feels well.  The plan was to begin FOLFIRI/Panitumumab today, but the neutrophils and platelets are low.  The cytopenias may be related to the recent course of radiation.  He also has a history of suspected portal hypertension secondary to toxicity from chemotherapy.  Mr. Aultman will return for an office visit and a repeat CBC prior to FOLFIRI/panitumumab next week.  Julieanne Manson, MD

## 2017-06-23 NOTE — Telephone Encounter (Signed)
Printed avs and calender of upcoming appointment. Per 4/4 los. Unable to schedule provider on the same dy as infusion due to incompatiable schedules

## 2017-06-28 ENCOUNTER — Other Ambulatory Visit: Payer: Self-pay | Admitting: Nurse Practitioner

## 2017-06-28 DIAGNOSIS — C20 Malignant neoplasm of rectum: Secondary | ICD-10-CM

## 2017-06-28 DIAGNOSIS — C787 Secondary malignant neoplasm of liver and intrahepatic bile duct: Principal | ICD-10-CM

## 2017-06-29 ENCOUNTER — Inpatient Hospital Stay: Payer: BC Managed Care – PPO

## 2017-06-29 ENCOUNTER — Inpatient Hospital Stay: Payer: BC Managed Care – PPO | Admitting: Nurse Practitioner

## 2017-06-29 ENCOUNTER — Encounter: Payer: Self-pay | Admitting: Nurse Practitioner

## 2017-06-29 VITALS — BP 116/84 | HR 84 | Temp 98.0°F | Resp 12 | Ht 70.0 in | Wt 143.9 lb

## 2017-06-29 DIAGNOSIS — R21 Rash and other nonspecific skin eruption: Secondary | ICD-10-CM

## 2017-06-29 DIAGNOSIS — E279 Disorder of adrenal gland, unspecified: Secondary | ICD-10-CM | POA: Diagnosis not present

## 2017-06-29 DIAGNOSIS — D709 Neutropenia, unspecified: Secondary | ICD-10-CM

## 2017-06-29 DIAGNOSIS — C20 Malignant neoplasm of rectum: Secondary | ICD-10-CM

## 2017-06-29 DIAGNOSIS — R911 Solitary pulmonary nodule: Secondary | ICD-10-CM | POA: Diagnosis not present

## 2017-06-29 DIAGNOSIS — C787 Secondary malignant neoplasm of liver and intrahepatic bile duct: Principal | ICD-10-CM

## 2017-06-29 DIAGNOSIS — Z95828 Presence of other vascular implants and grafts: Secondary | ICD-10-CM

## 2017-06-29 DIAGNOSIS — D696 Thrombocytopenia, unspecified: Secondary | ICD-10-CM

## 2017-06-29 LAB — CBC WITH DIFFERENTIAL (CANCER CENTER ONLY)
BASOS ABS: 0 10*3/uL (ref 0.0–0.1)
BASOS PCT: 1 %
EOS ABS: 0.1 10*3/uL (ref 0.0–0.5)
EOS PCT: 7 %
HCT: 35 % — ABNORMAL LOW (ref 38.4–49.9)
Hemoglobin: 11.5 g/dL — ABNORMAL LOW (ref 13.0–17.1)
Lymphocytes Relative: 14 %
Lymphs Abs: 0.2 10*3/uL — ABNORMAL LOW (ref 0.9–3.3)
MCH: 29.7 pg (ref 27.2–33.4)
MCHC: 32.8 g/dL (ref 32.0–36.0)
MCV: 90.6 fL (ref 79.3–98.0)
MONO ABS: 0.2 10*3/uL (ref 0.1–0.9)
Monocytes Relative: 15 %
Neutro Abs: 0.9 10*3/uL — ABNORMAL LOW (ref 1.5–6.5)
Neutrophils Relative %: 63 %
PLATELETS: 87 10*3/uL — AB (ref 140–400)
RBC: 3.87 MIL/uL — ABNORMAL LOW (ref 4.20–5.82)
RDW: 19.4 % — AB (ref 11.0–14.6)
WBC Count: 1.4 10*3/uL — ABNORMAL LOW (ref 4.0–10.3)

## 2017-06-29 LAB — CMP (CANCER CENTER ONLY)
ALK PHOS: 77 U/L (ref 40–150)
ALT: 23 U/L (ref 0–55)
ANION GAP: 8 (ref 3–11)
AST: 27 U/L (ref 5–34)
Albumin: 3.7 g/dL (ref 3.5–5.0)
BILIRUBIN TOTAL: 0.6 mg/dL (ref 0.2–1.2)
BUN: 14 mg/dL (ref 7–26)
CALCIUM: 8.9 mg/dL (ref 8.4–10.4)
CO2: 23 mmol/L (ref 22–29)
CREATININE: 0.8 mg/dL (ref 0.70–1.30)
Chloride: 111 mmol/L — ABNORMAL HIGH (ref 98–109)
GFR, Est AFR Am: >60 mL/min (ref >60)
GFR, Estimated: >60 mL/min (ref >60)
Glucose, Bld: 105 mg/dL (ref 70–140)
Potassium: 3.5 mmol/L (ref 3.5–5.1)
Sodium: 142 mmol/L (ref 136–145)
TOTAL PROTEIN: 6.3 g/dL — AB (ref 6.4–8.3)

## 2017-06-29 LAB — MAGNESIUM: Magnesium: 2.2 mg/dL (ref 1.5–2.5)

## 2017-06-29 MED ORDER — HEPARIN SOD (PORK) LOCK FLUSH 100 UNIT/ML IV SOLN
500.0000 [IU] | Freq: Once | INTRAVENOUS | Status: AC | PRN
  Administered 2017-06-29: 500 [IU] via INTRAVENOUS
  Filled 2017-06-29: qty 5

## 2017-06-29 MED ORDER — SODIUM CHLORIDE 0.9 % IJ SOLN
10.0000 mL | INTRAMUSCULAR | Status: DC | PRN
  Administered 2017-06-29: 10 mL via INTRAVENOUS
  Filled 2017-06-29: qty 10

## 2017-06-29 MED ORDER — MINOCYCLINE HCL 100 MG PO CAPS: 100.0000 mg | ORAL_CAPSULE | Freq: Two times a day (BID) | ORAL | 2 refills | Status: DC

## 2017-06-29 NOTE — Progress Notes (Addendum)
[place] OFFICE PROGRESS NOTE   Diagnosis: Rectal cancer  INTERVAL HISTORY:   Jacob [name] returns as scheduled.  Treatment was held last week due to neutropenia and thrombocytopenia.  He denies bleeding.  No fever.  He had an episode of abdominal discomfort earlier this morning.  He had a "good bowel movement" and vomited one time.  The abdominal discomfort has resolved.  Skin rash continues to be improved.  He thinks the hoarseness is worsening.  Objective:  Vital signs in last 24 hours:  Blood pressure 116/84, pulse 84, temperature 98 F (36.7 C), temperature source Oral, resp. rate 12, height '5\' 10"'  (1.778 m), weight 143 lb 14.4 oz (65.3 kg), SpO2 100 %.    HEENT: No thrush or ulcers. Resp: Lungs clear bilaterally. Cardio: Regular rate and rhythm.   GI: Abdomen soft and nontender.  No hepatomegaly. Vascular: No leg edema.  Skin: No significant rash. Port-A-Cath without erythema.   Lab Results:  Lab Results  Component Value Date   WBC 1.8 (L) 06/23/2017   HGB 11.9 (L) 04/21/2017   HCT 35.4 (L) 06/23/2017   MCV 91.2 06/23/2017   PLT 73 (L) 06/23/2017   NEUTROABS 1.2 (L) 06/23/2017    Imaging:  No results found.  Medications: I have reviewed the patient's current medications.  Assessment/Plan: 1. Rectal cancer. Partially obstructing mass noted 1-2 cm from the anal verge on a colonoscopy 03/06/2012. Endoscopic ultrasound 03/14/2012 with a 7.5 mm thick, 3.2 cm wide hypoechoic, irregularly bordered mass that clearly passed into and through the muscularis propria layer of the distal rectal wall (uT3); 3 small (largest 7 mm) perirectal lymph nodes. The lymph nodes were all round, discrete, hypoechoic, homogenous; suspicious for malignant involvement (uN1).  No RAS mutation identified by [facility] 1 testing on the colon resection specimen 06/29/2012, APC alteration identified, microsatellite stable  He began radiation and concurrent Xeloda chemotherapy  on 03/20/2012, completed 04/27/2012.   Low anterior resection/coloanal anastomosis and diverting ileostomy 06/29/2012 with the final pathology revealing a T2N0 tumor with extensive fibrosis and negative margins.   Cycle 1 of adjuvant CAPOX 07/19/2012. Cycle 5 of adjuvant CAPOX 10/11/2012.   CEA 2.5 on 11/14/2012.   CEA 14.6 03/08/2013.   Restaging CT evaluation 03/08/2013 with a 4 mm pulmonary nodule left lung base not identified on comparison exam; new liver lesions including a 29 x 26 mm irregular peripheral enhancing rounded lesion in the dome of the right hepatic lobe, a less well-defined new subcapsular lesion in the lateral right hepatic lobe measuring 12 mm, a new subcapsular lesion in the anterior right hepatic lobe adjacent to the gallbladder fossa measuring 10 mm; a rounded low-density lesion in the inferior right hepatic lobe measuring 10 mm compared to 7 mm on the prior study (radiologist commented this may represent an enlarged cyst).   MRI of the abdomen 04/12/2013 confirmed multiple T2 hyperintense metastatic lesions throughout the right liver   Initiation of FOLFIRI/Avastin with genotype based irinotecan dosing per the [facility] study 04/05/2013.   Restaging CT evaluation on 06/20/2013 (after 2 cycles/4 treatments) showed improvement in the liver metastases and stable size of a left lower lobe pulmonary nodule now with central cavitation.   Continuation of FOLFIRI/Avastin.   Restaging CT evaluation 08/14/2013-decrease in the size of liver metastases, slight decrease in the size of a cavitary left lower lobe nodule, no evidence of disease progression.   Status post right hepatic lobectomy 09/17/2013. Pathology showed multiple foci of metastatic adenocarcinoma (5 nodules of metastatic adenocarcinoma  4 of which are subcapsular with the nodules ranging in size from 0.6-1.8 cm in greatest dimension). Margins not involved. Biopsy of a portal lymph node showed benign adipose  tissue; no lymph node tissue or malignancy.   Normal CEA 11/06/2013   CT 11/06/2013 with a new pleural-based right lower chest lesion, slight in enlargement of the a left-sided lung nodule  CT 01/29/2014 with a decrease in the right lower chest pleural-based lesion and a slight increase of a left-sided lung lesion, other lung lesions were stable  CT chest 05/02/2014 with a decrease in the size of a right lower lobe pulmonary nodule months similar size of a dominant left lower lobe nodule, minimal enlargement of a smaller left lower lobe nodule, no new site of disease  CT chest 11/07/2014 with a slight increase in several left-sided lung nodules  CT chest 02/10/2015 with new small left hilar lymph nodes, a possible new left lower lobe nodule, and stability of other lung nodules  PET scan 03/12/2015 with hypermetabolic left hilar nodes, hypermetabolic left lower lobe nodules, hyper metabolic retroperitoneal nodes, intense hypermetabolism at the coloanal anastomosis, and hypermetabolic thickening in the presacral space  Cycle 1 FOLFIRI/PANITUMUMAB 05/21/2015  Cycle 2 FOLFIRI/PANITUMUMAB 06/05/2015  Cycle 3 FOLFIRI/PANITUMUMAB 06/19/2015  Cycle 4 FOLFIRI 07/10/2015  Cycle 5 FOLFIRI 07/24/2015  Restaging CTs 08/06/2015-resolution of hilar/retroperitoneal adenopathy, improvement in the hypermetabolic lung nodule, other lung nodules are stable, no new lesions  Cycle 6 FOLFIRI with PANITUMUMAB 08/28/2015 -panitumumab dose reduced  Cycle 7 FOLFIRI 09/11/2015-no panitumumab given  Cycle 8 FOLFIRI with PANITUMUMAB 09/25/2015-PANITUMUMAB dose reduced  Cycle 9 FOLFIRI 10/09/2015-no PANITUMUMAB given  Cycle 10 FOLFIRI with panitumumab 10/23/2015  CTs 11/06/2015-possible slight enlargement of a left lower lobe nodule, no other evidence of disease progression.  CTs 02/16/2016-enlargement of left-sided pulmonary nodules and mediastinal/left hilar nodes, improved splenomegaly  CTs  06/21/2016-increased size of mediastinal/left hilar nodes, increased left lung nodules, and increased soft tissue at the porta hepatis  CTs 10/28/2016-progressive disease in the chest and abdomen with an enlarging left hilar mass and slight interval enlargement of pulmonary lesions. Right upper quadrant necrotic nodal mass significantly increased in size with mass effect on the liver and possible invasion. Significant compression of the intrahepatic IVC. New right hepatic lobe lesion. Moderate pelvic ascites.  Cycle 1 FOLFIRI/PANITUMUMAB 11/04/2016  Cycle 2 FOLFIRI/PANITUMUMAB 11/18/2016  Cycle 3 FOLFIRI/panitumumab 12/02/2016  Cycle4 FOLFIRI/PANITUMUMAB 12/15/2016  Cycle 5 FOLFIRI/PANITUMUMAB 12/30/2016  CTs 01/06/2017-no evidence of progressive disease, decreased chest adenopathy, stable left lower lobe nodules, decreased liver metastasis, decreased right retroperitoneal mass  Cycle 6 FOLFIRI/panitumumab 01/13/2017  Cycle 7 FOLFIRI/Panitumumab 01/27/2017  Cycle 8 FOLFIRI/panitumumab 02/17/2017  Cycle 9 FOLFIRI/Panitumumab 03/03/2017  Cycle 10 FOLFIRI/Panitumumab 03/24/2017  CTs 04/06/2017-enlargement of right adrenal mass (smaller than October 2019), stable left hilar fullness and lung nodules  Cycle 11 FOLFIRI/Panitumumab 04/07/2017  Cycle 12 FOLFIRI/panitumumab 04/21/2017  Cycle 13 FOLFIRI/Panitumumab 05/05/2017  Palliative radiation to the right adrenal mass 226 2019-3 02/2018 2. History ofIrregular bowel habits/rectal bleeding secondary to #1. 3. History of Mild elevation of the liver enzymes. Question secondary to hepatic steatosis. 4. Indeterminate 8 mm posterior right liver lesion on the staging CT 03/06/2012. 5. Mildly elevated CEA at 5.7 on 03/06/2012. 6. History of radiation erythema at the groin and perineum 7. 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 No clinical evidence of  thrombophlebitis or extravasation. 8. Delayed nausea following chemotherapy-Decadron prophylaxis was added with cycle 3 CAPOX-improved. 9. Ileostomy takedown  12/07/2012. 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 11. Port-A-Cath placement 12/31/204 12. Severe neutropenia secondary to chemotherapy following cycle 1 of FOLFIRI, chemotherapy was dose reduced and he received Neulasta with day 15 cycle 1  13. Nausea and vomiting following cycle 1 of FOLFIRI 14. Rectal stricture-manual/colonoscopic dilatation by Dr. Ardis Hughs 03/01/2016  APR 08/17/2016-no evidence of malignancy 15. History ofLeukopenia/Thrombocytopenia-persistent, potentially a sequelae of chemotherapy or hepatic toxicity from chemotherapy/radiation. Bone marrow biopsy 11/20/2014 showed cellular bone marrow with trilineage hematopoiesis. Significant dyspoiesis was not present and there was no evidence of metastatic carcinoma. Cytogenetic analysis showed the presence of normal male chromosomes with no observable clonal chromosomal abnormalities.  probable cirrhosis 16. Genetic testing-negative genetic panel in March 2014 17. 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 Severe over the face-steroid Dosepak prescribed 07/03/2015. Improved 07/10/2015. Further improved 07/24/2015, 08/07/2015, 08/28/2015 18. Diarrhea secondary to chemotherapy-encouraged to use Imodium  19. History of Paronychia secondary to PANITUMUMAB 20. 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 Neutropenia, thrombocytopenia 06/23/2017.  Possibly related to recent radiation.  He also has a history of suspected portal hypertension secondary to toxicity from chemotherapy.     Disposition: Mr. Lichty appears stable.  Treatment was held last week due to neutropenia/thrombocytopenia.  We reviewed the CBC from today.  He has persistent neutropenia/thrombocytopenia.  This is likely  related to the recent radiation.  We will continue to hold treatment.  He will return for lab, follow-up and possible FOLFIRI/Panitumumab in 2 weeks.  He will contact the office in the interim with any problems.  We specifically discussed fever, chills, other signs of infection, bleeding.  Patient seen with Dr. Benay Spice.  Jacob [name] ANP/GNP-BC   06/29/2017  10:05 AM This was a shared visit with Jacob [name].  Mr. Bleicher has persistent neutropenia/thrombocytopenia, most likely related to toxicity from radiation.  FOLFIRI will remain on hold.  When he resumes treatment the plan is to dose escalate panitumumab as tolerated.  Julieanne Manson, MD

## 2017-06-30 ENCOUNTER — Ambulatory Visit: Payer: BC Managed Care – PPO

## 2017-07-04 ENCOUNTER — Ambulatory Visit
Admission: RE | Admit: 2017-07-04 | Payer: BC Managed Care – PPO | Source: Ambulatory Visit | Admitting: Radiation Oncology

## 2017-07-10 ENCOUNTER — Other Ambulatory Visit: Payer: Self-pay | Admitting: Oncology

## 2017-07-14 ENCOUNTER — Inpatient Hospital Stay: Payer: BC Managed Care – PPO

## 2017-07-14 ENCOUNTER — Inpatient Hospital Stay (HOSPITAL_BASED_OUTPATIENT_CLINIC_OR_DEPARTMENT_OTHER): Payer: BC Managed Care – PPO | Admitting: Oncology

## 2017-07-14 VITALS — BP 120/80 | HR 78 | Temp 97.8°F | Resp 18 | Ht 70.0 in | Wt 147.6 lb

## 2017-07-14 DIAGNOSIS — C787 Secondary malignant neoplasm of liver and intrahepatic bile duct: Secondary | ICD-10-CM | POA: Diagnosis not present

## 2017-07-14 DIAGNOSIS — D709 Neutropenia, unspecified: Secondary | ICD-10-CM

## 2017-07-14 DIAGNOSIS — R911 Solitary pulmonary nodule: Secondary | ICD-10-CM | POA: Diagnosis not present

## 2017-07-14 DIAGNOSIS — E279 Disorder of adrenal gland, unspecified: Secondary | ICD-10-CM

## 2017-07-14 DIAGNOSIS — C20 Malignant neoplasm of rectum: Secondary | ICD-10-CM

## 2017-07-14 DIAGNOSIS — D708 Other neutropenia: Secondary | ICD-10-CM

## 2017-07-14 DIAGNOSIS — Z95828 Presence of other vascular implants and grafts: Secondary | ICD-10-CM

## 2017-07-14 LAB — CMP (CANCER CENTER ONLY)
ALK PHOS: 88 U/L (ref 40–150)
ALT: 24 U/L (ref 0–55)
ANION GAP: 8 (ref 3–11)
AST: 24 U/L (ref 5–34)
Albumin: 3.7 g/dL (ref 3.5–5.0)
BUN: 10 mg/dL (ref 7–26)
CALCIUM: 9.1 mg/dL (ref 8.4–10.4)
CO2: 26 mmol/L (ref 22–29)
Chloride: 108 mmol/L (ref 98–109)
Creatinine: 0.78 mg/dL (ref 0.70–1.30)
GFR, Est AFR Am: >60 mL/min (ref >60)
Glucose, Bld: 81 mg/dL (ref 70–140)
POTASSIUM: 4 mmol/L (ref 3.5–5.1)
Sodium: 142 mmol/L (ref 136–145)
TOTAL PROTEIN: 6.7 g/dL (ref 6.4–8.3)

## 2017-07-14 LAB — CBC WITH DIFFERENTIAL (CANCER CENTER ONLY)
BASOS ABS: 0 10*3/uL (ref 0.0–0.1)
BASOS PCT: 0 %
EOS PCT: 6 %
Eosinophils Absolute: 0.1 10*3/uL (ref 0.0–0.5)
HCT: 35.4 % — ABNORMAL LOW (ref 38.4–49.9)
Hemoglobin: 11.5 g/dL — ABNORMAL LOW (ref 13.0–17.1)
Lymphocytes Relative: 28 %
Lymphs Abs: 0.3 10*3/uL — ABNORMAL LOW (ref 0.9–3.3)
MCH: 29.7 pg (ref 27.2–33.4)
MCHC: 32.5 g/dL (ref 32.0–36.0)
MCV: 91.5 fL (ref 79.3–98.0)
MONO ABS: 0.1 10*3/uL (ref 0.1–0.9)
Monocytes Relative: 12 %
NEUTROS ABS: 0.6 10*3/uL — AB (ref 1.5–6.5)
Neutrophils Relative %: 54 %
PLATELETS: 106 10*3/uL — AB (ref 140–400)
RBC: 3.87 MIL/uL — AB (ref 4.20–5.82)
RDW: 16.4 % — AB (ref 11.0–14.6)
WBC: 1.2 10*3/uL — AB (ref 4.0–10.3)

## 2017-07-14 LAB — CEA (IN HOUSE-CHCC): CEA (CHCC-IN HOUSE): 14.65 ng/mL — AB (ref 0.00–5.00)

## 2017-07-14 LAB — MAGNESIUM: Magnesium: 2.2 mg/dL (ref 1.7–2.4)

## 2017-07-14 MED ORDER — SODIUM CHLORIDE 0.9 % IJ SOLN
10.0000 mL | INTRAMUSCULAR | Status: DC | PRN
  Administered 2017-07-14: 10 mL via INTRAVENOUS
  Filled 2017-07-14: qty 10

## 2017-07-14 MED ORDER — HEPARIN SOD (PORK) LOCK FLUSH 100 UNIT/ML IV SOLN
500.0000 [IU] | Freq: Once | INTRAVENOUS | Status: AC | PRN
  Administered 2017-07-14: 500 [IU] via INTRAVENOUS
  Filled 2017-07-14: qty 5

## 2017-07-14 NOTE — Progress Notes (Signed)
[place] OFFICE PROGRESS NOTE   Diagnosis: Rectal cancer  INTERVAL HISTORY:   Jacob [name] returns as scheduled.  He remains off of systemic therapy.  He reports an episode of left anterolateral chest discomfort last week.  This lasted for approximately 5 days and has resolved.  There was tenderness over the chest wall.  The right back and abdominal pain has resolved.  Objective:  Vital signs in last 24 hours:  Blood pressure 120/80, pulse 78, temperature 97.8 F (36.6 C), temperature source Oral, resp. rate 18, height '5\' 10"'  (1.778 m), weight 147 lb 9.6 oz (67 kg), SpO2 100 %.    Resp: Lungs clear bilaterally Cardio: Regular rate and rhythm GI: No hepatomegaly, nontender, left lower quadrant colostomy Vascular: No leg edema Musculoskeletal: No tenderness of the left chest wall.  No rash.   Portacath/PICC-without erythema  Lab Results:  Lab Results  Component Value Date   WBC 1.2 (L) 07/14/2017   HGB 11.5 (L) 07/14/2017   HCT 35.4 (L) 07/14/2017   MCV 91.5 07/14/2017   PLT 106 (L) 07/14/2017   NEUTROABS 0.6 (L) 07/14/2017    CMP     Component Value Date/Time   NA 142 06/29/2017 0948   NA 142 03/24/2017 1009   K 3.5 06/29/2017 0948   K 4.0 03/24/2017 1009   CL 111 (H) 06/29/2017 0948   CL 108 (H) 08/30/2012 0903   CO2 23 06/29/2017 0948   CO2 27 03/24/2017 1009   GLUCOSE 105 06/29/2017 0948   GLUCOSE 96 03/24/2017 1009   GLUCOSE 96 08/30/2012 0903   BUN 14 06/29/2017 0948   BUN 12.2 03/24/2017 1009   CREATININE 0.80 06/29/2017 0948   CREATININE 0.7 03/24/2017 1009   CALCIUM 8.9 06/29/2017 0948   CALCIUM 8.8 03/24/2017 1009   PROT 6.3 (L) 06/29/2017 0948   PROT 6.1 (L) 03/24/2017 1009   ALBUMIN 3.7 06/29/2017 0948   ALBUMIN 3.6 03/24/2017 1009   AST 27 06/29/2017 0948   AST 19 03/24/2017 1009   ALT 23 06/29/2017 0948   ALT 14 03/24/2017 1009   ALKPHOS 77 06/29/2017 0948   ALKPHOS 67 03/24/2017 1009   BILITOT 0.6 06/29/2017 0948   BILITOT <0.22 03/24/2017 1009   GFRNONAA >60 06/29/2017 0948   GFRAA >60 06/29/2017 0948    Lab Results  Component Value Date   CEA1 12.38 (H) 06/23/2017     Medications: I have reviewed the patient's current medications.   Assessment/Plan: 1. Rectal cancer. Partially obstructing mass noted 1-2 cm from the anal verge on a colonoscopy 03/06/2012. Endoscopic ultrasound 03/14/2012 with a 7.5 mm thick, 3.2 cm wide hypoechoic, irregularly bordered mass that clearly passed into and through the muscularis propria layer of the distal rectal wall (uT3); 3 small (largest 7 mm) perirectal lymph nodes. The lymph nodes were all round, discrete, hypoechoic, homogenous; suspicious for malignant involvement (uN1).  No RAS mutation identified by [facility] 1 testing on the colon resection specimen 06/29/2012, APC alteration identified, microsatellite stable  He began radiation and concurrent Xeloda chemotherapy on 03/20/2012, completed 04/27/2012.   Low anterior resection/coloanal anastomosis and diverting ileostomy 06/29/2012 with the final pathology revealing a T2N0 tumor with extensive fibrosis and negative margins.   Cycle 1 of adjuvant CAPOX 07/19/2012. Cycle 5 of adjuvant CAPOX 10/11/2012.   CEA 2.5 on 11/14/2012.   CEA 14.6 03/08/2013.   Restaging CT evaluation 03/08/2013 with a 4 mm pulmonary nodule left lung base not identified on comparison exam; new liver lesions including a  29 x 26 mm irregular peripheral enhancing rounded lesion in the dome of the right hepatic lobe, a less well-defined new subcapsular lesion in the lateral right hepatic lobe measuring 12 mm, a new subcapsular lesion in the anterior right hepatic lobe adjacent to the gallbladder fossa measuring 10 mm; a rounded low-density lesion in the inferior right hepatic lobe measuring 10 mm compared to 7 mm on the prior study (radiologist commented this may represent an enlarged cyst).   MRI of the abdomen 04/12/2013 confirmed  multiple T2 hyperintense metastatic lesions throughout the right liver   Initiation of FOLFIRI/Avastin with genotype based irinotecan dosing per the [facility] study 04/05/2013.   Restaging CT evaluation on 06/20/2013 (after 2 cycles/4 treatments) showed improvement in the liver metastases and stable size of a left lower lobe pulmonary nodule now with central cavitation.   Continuation of FOLFIRI/Avastin.   Restaging CT evaluation 08/14/2013-decrease in the size of liver metastases, slight decrease in the size of a cavitary left lower lobe nodule, no evidence of disease progression.   Status post right hepatic lobectomy 09/17/2013. Pathology showed multiple foci of metastatic adenocarcinoma (5 nodules of metastatic adenocarcinoma 4 of which are subcapsular with the nodules ranging in size from 0.6-1.8 cm in greatest dimension). Margins not involved. Biopsy of a portal lymph node showed benign adipose tissue; no lymph node tissue or malignancy.   Normal CEA 11/06/2013   CT 11/06/2013 with a new pleural-based right lower chest lesion, slight in enlargement of the a left-sided lung nodule  CT 01/29/2014 with a decrease in the right lower chest pleural-based lesion and a slight increase of a left-sided lung lesion, other lung lesions were stable  CT chest 05/02/2014 with a decrease in the size of a right lower lobe pulmonary nodule months similar size of a dominant left lower lobe nodule, minimal enlargement of a smaller left lower lobe nodule, no new site of disease  CT chest 11/07/2014 with a slight increase in several left-sided lung nodules  CT chest 02/10/2015 with new small left hilar lymph nodes, a possible new left lower lobe nodule, and stability of other lung nodules  PET scan 03/12/2015 with hypermetabolic left hilar nodes, hypermetabolic left lower lobe nodules, hyper metabolic retroperitoneal nodes, intense hypermetabolism at the coloanal anastomosis, and hypermetabolic thickening in  the presacral space  Cycle 1 FOLFIRI/PANITUMUMAB 05/21/2015  Cycle 2 FOLFIRI/PANITUMUMAB 06/05/2015  Cycle 3 FOLFIRI/PANITUMUMAB 06/19/2015  Cycle 4 FOLFIRI 07/10/2015  Cycle 5 FOLFIRI 07/24/2015  Restaging CTs 08/06/2015-resolution of hilar/retroperitoneal adenopathy, improvement in the hypermetabolic lung nodule, other lung nodules are stable, no new lesions  Cycle 6 FOLFIRI with PANITUMUMAB 08/28/2015 -panitumumab dose reduced  Cycle 7 FOLFIRI 09/11/2015-no panitumumab given  Cycle 8 FOLFIRI with PANITUMUMAB 09/25/2015-PANITUMUMAB dose reduced  Cycle 9 FOLFIRI 10/09/2015-no PANITUMUMAB given  Cycle 10 FOLFIRI with panitumumab 10/23/2015  CTs 11/06/2015-possible slight enlargement of a left lower lobe nodule, no other evidence of disease progression.  CTs 02/16/2016-enlargement of left-sided pulmonary nodules and mediastinal/left hilar nodes, improved splenomegaly  CTs 06/21/2016-increased size of mediastinal/left hilar nodes, increased left lung nodules, and increased soft tissue at the porta hepatis  CTs 10/28/2016-progressive disease in the chest and abdomen with an enlarging left hilar mass and slight interval enlargement of pulmonary lesions. Right upper quadrant necrotic nodal mass significantly increased in size with mass effect on the liver and possible invasion. Significant compression of the intrahepatic IVC. New right hepatic lobe lesion. Moderate pelvic ascites.  Cycle 1 FOLFIRI/PANITUMUMAB 11/04/2016  Cycle 2 FOLFIRI/PANITUMUMAB 11/18/2016  Cycle 3  FOLFIRI/panitumumab 12/02/2016  Cycle4 FOLFIRI/PANITUMUMAB 12/15/2016  Cycle 5 FOLFIRI/PANITUMUMAB 12/30/2016  CTs 01/06/2017-no evidence of progressive disease, decreased chest adenopathy, stable left lower lobe nodules, decreased liver metastasis, decreased right retroperitoneal mass  Cycle 6 FOLFIRI/panitumumab 01/13/2017  Cycle 7 FOLFIRI/Panitumumab 01/27/2017  Cycle 8 FOLFIRI/panitumumab  02/17/2017  Cycle 9 FOLFIRI/Panitumumab 03/03/2017  Cycle 10 FOLFIRI/Panitumumab 03/24/2017  CTs 04/06/2017-enlargement of right adrenal mass (smaller than October 2019), stable left hilar fullness and lung nodules  Cycle 11 FOLFIRI/Panitumumab 04/07/2017  Cycle 12 FOLFIRI/panitumumab 04/21/2017  Cycle 13 FOLFIRI/Panitumumab 05/05/2017  Palliative radiation to the right adrenal mass 226 2019-3 02/2018 2. History ofIrregular bowel habits/rectal bleeding secondary to #1. 3. History of Mild elevation of the liver enzymes. Question secondary to hepatic steatosis. 4. Indeterminate 8 mm posterior right liver lesion on the staging CT 03/06/2012. 5. Mildly elevated CEA at 5.7 on 03/06/2012. 6. History of radiation erythema at the groin and perineum 7. 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 No clinical evidence of thrombophlebitis or extravasation. 8. Delayed nausea following chemotherapy-Decadron prophylaxis was added with cycle 3 CAPOX-improved. 9. Ileostomy takedown 12/07/2012. 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 11. Port-A-Cath placement 12/31/204 12. Severe neutropenia secondary to chemotherapy following cycle 1 of FOLFIRI, chemotherapy was dose reduced and he received Neulasta with day 15 cycle 1  13. Nausea and vomiting following cycle 1 of FOLFIRI 14. Rectal stricture-manual/colonoscopic dilatation by Dr. Ardis Hughs 03/01/2016  APR 08/17/2016-no evidence of malignancy 15. History ofLeukopenia/Thrombocytopenia-persistent, potentially a sequelae of chemotherapy or hepatic toxicity from chemotherapy/radiation. Bone marrow biopsy 11/20/2014 showed cellular bone marrow with trilineage hematopoiesis. Significant dyspoiesis was not present and there was no evidence of metastatic carcinoma. Cytogenetic analysis showed the presence of normal male chromosomes with no observable clonal chromosomal  abnormalities.  probable cirrhosis 16. Genetic testing-negative genetic panel in March 2014 17. 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 Severe over the face-steroid Dosepak prescribed 07/03/2015. Improved 07/10/2015. Further improved 07/24/2015, 08/07/2015, 08/28/2015 18. Diarrhea secondary to chemotherapy-encouraged to use Imodium  19. History of Paronychia secondary to PANITUMUMAB 20. 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 Neutropenia, thrombocytopenia 06/23/2017.  Possibly related to recent radiation.  He also has a history of suspected portal hypertension secondary to toxicity from chemotherapy.     Disposition: Jacob [name] appears well.  The lately count has recovered partially, but he continues to have neutropenia.  The hematologic findings are most likely in part secondary to recent radiation combined with heavy pretreatment with chemotherapy.  He could have myelodysplasia.  We will hold systemic therapy until the white count has improved.  He will return for an office visit and CBC in 2 weeks.  We will add a trial of G-CSF if the white count remains low in 2 weeks.   Betsy Coder, MD  07/14/2017  9:49 AM

## 2017-07-15 NOTE — Progress Notes (Signed)
  Radiation Oncology         [phone] ________________________________  Name: Jacob [name] MRN: [id number]  Date: 05/06/2017  DOB: 1971-01-14  RESPIRATORY MOTION MANAGEMENT SIMULATION  NARRATIVE:  In order to account for effect of respiratory motion on target structures and other organs in the planning and delivery of radiotherapy, this patient underwent respiratory motion management simulation.  To accomplish this, when the patient was brought to the CT simulation planning suite, 4D respiratoy motion management CT images were obtained.  The CT images were loaded into the planning software.  Then, using a variety of tools including Cine, MIP, and standard views, the target volume and planning target volumes (PTV) were delineated.  Avoidance structures were contoured.  Treatment planning then occurred.  Dose volume histograms were generated and reviewed for each of the requested structure.  The resulting plan was carefully reviewed and approved today.   ------------------------------------------------  Jodelle Gross, MD, PhD

## 2017-07-15 NOTE — Progress Notes (Addendum)
[facility] Simulation and Treatment Planning Note   Name:  Jacob [name] MRN: [id number]   Date: 07/15/2017  DOB: 08-Apr-1970  Status:outpatient    DIAGNOSIS:    ICD-10-CM   1. Rectal cancer ([place]) C20   2. 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   NARRATIVE:The patient was brought to the [facility].  Identity was confirmed.  All relevant records and images related to the planned course of therapy were reviewed.  Then, the patient was positioned in a stable reproducible clinical set-up for radiation therapy. Abdominal compression was applied by me.  4D CT images were obtained and reproducible breathing pattern was confirmed. Free breathing CT images were obtained.  Skin markings were placed.  The CT images were loaded into the planning software where the target and avoidance structures were contoured.  The radiation prescription was entered and confirmed.    TREATMENT PLANNING NOTE:  Treatment planning then occurred. I have requested : IMRT plan.  IMRT planning is necessary medically due to the close proximity of the target volume to adjacent nearby critical structures including the liver, right kidney, and spinal cord.  3 dimensional simulation is performed and dose volume histogram of the gross tumor volume, planning tumor volume and criticial normal structures including the spinal cord and lungs were analyzed and requested.  Special treatment procedure was performed due to high dose per fraction.  The patient will be monitored for increased risk of toxicity.  Daily imaging using cone beam CT will be used for target localization.  I anticipate that the patient will receive 40 Gy in 8 fractions to target volume. Further adjustments will be made based on the  planning process is necessary.  ------------------------------------------------  Jodelle Gross, MD, PhD

## 2017-07-16 ENCOUNTER — Inpatient Hospital Stay: Payer: BC Managed Care – PPO

## 2017-07-28 ENCOUNTER — Ambulatory Visit (HOSPITAL_COMMUNITY)
Admission: RE | Admit: 2017-07-28 | Discharge: 2017-07-28 | Disposition: A | Discharge status: Home / Self Care | Payer: BC Managed Care – PPO | Source: Ambulatory Visit | Attending: Oncology | Admitting: Oncology

## 2017-07-28 ENCOUNTER — Telehealth: Payer: Self-pay | Admitting: Oncology

## 2017-07-28 ENCOUNTER — Inpatient Hospital Stay: Payer: BC Managed Care – PPO

## 2017-07-28 ENCOUNTER — Inpatient Hospital Stay: Payer: BC Managed Care – PPO | Attending: Oncology | Admitting: Oncology

## 2017-07-28 VITALS — BP 122/81 | HR 82 | Temp 99.1°F | Resp 17 | Ht 70.0 in | Wt 145.9 lb

## 2017-07-28 DIAGNOSIS — H5319 Other subjective visual disturbances: Secondary | ICD-10-CM | POA: Insufficient documentation

## 2017-07-28 DIAGNOSIS — R918 Other nonspecific abnormal finding of lung field: Secondary | ICD-10-CM | POA: Insufficient documentation

## 2017-07-28 DIAGNOSIS — E279 Disorder of adrenal gland, unspecified: Secondary | ICD-10-CM | POA: Insufficient documentation

## 2017-07-28 DIAGNOSIS — C787 Secondary malignant neoplasm of liver and intrahepatic bile duct: Secondary | ICD-10-CM | POA: Insufficient documentation

## 2017-07-28 DIAGNOSIS — R970 Elevated carcinoembryonic antigen [CEA]: Secondary | ICD-10-CM | POA: Insufficient documentation

## 2017-07-28 DIAGNOSIS — R0602 Shortness of breath: Secondary | ICD-10-CM | POA: Diagnosis not present

## 2017-07-28 DIAGNOSIS — Z5112 Encounter for antineoplastic immunotherapy: Secondary | ICD-10-CM | POA: Insufficient documentation

## 2017-07-28 DIAGNOSIS — R600 Localized edema: Secondary | ICD-10-CM | POA: Insufficient documentation

## 2017-07-28 DIAGNOSIS — C20 Malignant neoplasm of rectum: Secondary | ICD-10-CM | POA: Insufficient documentation

## 2017-07-28 DIAGNOSIS — R911 Solitary pulmonary nodule: Secondary | ICD-10-CM

## 2017-07-28 DIAGNOSIS — F419 Anxiety disorder, unspecified: Secondary | ICD-10-CM | POA: Insufficient documentation

## 2017-07-28 DIAGNOSIS — Z5111 Encounter for antineoplastic chemotherapy: Secondary | ICD-10-CM | POA: Insufficient documentation

## 2017-07-28 DIAGNOSIS — D708 Other neutropenia: Secondary | ICD-10-CM

## 2017-07-28 DIAGNOSIS — R1310 Dysphagia, unspecified: Secondary | ICD-10-CM | POA: Diagnosis not present

## 2017-07-28 DIAGNOSIS — Z95828 Presence of other vascular implants and grafts: Secondary | ICD-10-CM

## 2017-07-28 DIAGNOSIS — D696 Thrombocytopenia, unspecified: Secondary | ICD-10-CM | POA: Diagnosis not present

## 2017-07-28 DIAGNOSIS — C7931 Secondary malignant neoplasm of brain: Secondary | ICD-10-CM | POA: Diagnosis not present

## 2017-07-28 LAB — CBC WITH DIFFERENTIAL (CANCER CENTER ONLY)
Basophils Absolute: 0 10*3/uL (ref 0.0–0.1)
Basophils Relative: 0 %
EOS ABS: 0.1 10*3/uL (ref 0.0–0.5)
Eosinophils Relative: 2 %
HEMATOCRIT: 31.4 % — AB (ref 38.4–49.9)
HEMOGLOBIN: 10.2 g/dL — AB (ref 13.0–17.1)
LYMPHS PCT: 9 %
Lymphs Abs: 0.2 10*3/uL — ABNORMAL LOW (ref 0.9–3.3)
MCH: 29.4 pg (ref 27.2–33.4)
MCHC: 32.5 g/dL (ref 32.0–36.0)
MCV: 90.5 fL (ref 79.3–98.0)
MONOS PCT: 16 %
Monocytes Absolute: 0.4 10*3/uL (ref 0.1–0.9)
NEUTROS PCT: 73 %
Neutro Abs: 1.9 10*3/uL (ref 1.5–6.5)
Platelet Count: 92 10*3/uL — ABNORMAL LOW (ref 140–400)
RBC: 3.47 MIL/uL — ABNORMAL LOW (ref 4.20–5.82)
RDW: 15.7 % — ABNORMAL HIGH (ref 11.0–14.6)
WBC Count: 2.6 10*3/uL — ABNORMAL LOW (ref 4.0–10.3)

## 2017-07-28 MED ORDER — HEPARIN SOD (PORK) LOCK FLUSH 100 UNIT/ML IV SOLN: 500.0000 [IU] | Freq: Once | INTRAVENOUS | Status: DC

## 2017-07-28 MED ORDER — HEPARIN SOD (PORK) LOCK FLUSH 100 UNIT/ML IV SOLN
INTRAVENOUS | Status: AC
  Filled 2017-07-28: qty 5

## 2017-07-28 MED ORDER — SODIUM CHLORIDE 0.9 % IJ SOLN
10.0000 mL | INTRAMUSCULAR | Status: DC | PRN
  Administered 2017-07-28: 10 mL via INTRAVENOUS
  Filled 2017-07-28: qty 10

## 2017-07-28 MED ORDER — GADOBENATE DIMEGLUMINE 529 MG/ML IV SOLN
15.0000 mL | Freq: Once | INTRAVENOUS | Status: AC | PRN
  Administered 2017-07-28: 14 mL via INTRAVENOUS

## 2017-07-28 NOTE — Progress Notes (Signed)
[place] OFFICE PROGRESS NOTE   Diagnosis: Rectal cancer  INTERVAL HISTORY:   Mr. Feasel returns for a scheduled visit.  He reports an episode of discomfort at the left shoulder with an associated cough lasting for several days.  This improved when he took laxatives for constipation.  He reports the left upper chest/shoulder discomfort has almost completely resolved. He complains of seeing "flashing lights "intermittently for the past several weeks.  This is sometimes followed by a headache.  He has a history of migraine headaches as a child.  He wonders whether the flashing likes are indicative of a migraine aura. He also reports episodes of bumping into things with his right side.  No falls.  His vision is diminished when he looks to the right.  Objective:  Vital signs in last 24 hours:  Blood pressure 122/81, pulse 82, temperature 99.1 F (37.3 C), temperature source Oral, resp. rate 17, height '5\' 10"'  (1.778 m), weight 145 lb 14.4 oz (66.2 kg), SpO2 100 %.    Resp: Lungs clear bilaterally Cardio: Regular rate and rhythm GI: No hepatomegaly, left lower quadrant colostomy Vascular: No leg edema Neuro: Alert, the motor exam appears intact in the upper and lower extremities.  The gait is normal.  Finger-to-nose testing is normal.  He has minutes vision in the right visual field    Portacath/PICC-without erythema  Lab Results:  Lab Results  Component Value Date   WBC 2.6 (L) 07/28/2017   HGB 10.2 (L) 07/28/2017   HCT 31.4 (L) 07/28/2017   MCV 90.5 07/28/2017   PLT 92 (L) 07/28/2017   NEUTROABS 1.9 07/28/2017    CMP     Component Value Date/Time   NA 142 07/14/2017 0915   NA 142 03/24/2017 1009   K 4.0 07/14/2017 0915   K 4.0 03/24/2017 1009   CL 108 07/14/2017 0915   CL 108 (H) 08/30/2012 0903   CO2 26 07/14/2017 0915   CO2 27 03/24/2017 1009   GLUCOSE 81 07/14/2017 0915   GLUCOSE 96 03/24/2017 1009   GLUCOSE 96 08/30/2012 0903   BUN 10  07/14/2017 0915   BUN 12.2 03/24/2017 1009   CREATININE 0.78 07/14/2017 0915   CREATININE 0.7 03/24/2017 1009   CALCIUM 9.1 07/14/2017 0915   CALCIUM 8.8 03/24/2017 1009   PROT 6.7 07/14/2017 0915   PROT 6.1 (L) 03/24/2017 1009   ALBUMIN 3.7 07/14/2017 0915   ALBUMIN 3.6 03/24/2017 1009   AST 24 07/14/2017 0915   AST 19 03/24/2017 1009   ALT 24 07/14/2017 0915   ALT 14 03/24/2017 1009   ALKPHOS 88 07/14/2017 0915   ALKPHOS 67 03/24/2017 1009   BILITOT <0.2 (L) 07/14/2017 0915   BILITOT <0.22 03/24/2017 1009   GFRNONAA >60 07/14/2017 0915   GFRAA >60 07/14/2017 0915    Lab Results  Component Value Date   CEA1 14.65 (H) 07/14/2017    Medications: I have reviewed the patient's current medications.   Assessment/Plan: 1. Rectal cancer. Partially obstructing mass noted 1-2 cm from the anal verge on a colonoscopy 03/06/2012. Endoscopic ultrasound 03/14/2012 with a 7.5 mm thick, 3.2 cm wide hypoechoic, irregularly bordered mass that clearly passed into and through the muscularis propria layer of the distal rectal wall (uT3); 3 small (largest 7 mm) perirectal lymph nodes. The lymph nodes were all round, discrete, hypoechoic, homogenous; suspicious for malignant involvement (uN1).  No RAS mutation identified by [facility] 1 testing on the colon resection specimen 06/29/2012, APC alteration identified, microsatellite stable  He began radiation and concurrent Xeloda chemotherapy on 03/20/2012, completed 04/27/2012.   Low anterior resection/coloanal anastomosis and diverting ileostomy 06/29/2012 with the final pathology revealing a T2N0 tumor with extensive fibrosis and negative margins.   Cycle 1 of adjuvant CAPOX 07/19/2012. Cycle 5 of adjuvant CAPOX 10/11/2012.   CEA 2.5 on 11/14/2012.   CEA 14.6 03/08/2013.   Restaging CT evaluation 03/08/2013 with a 4 mm pulmonary nodule left lung base not identified on comparison exam; new liver lesions including a 29 x 26 mm irregular  peripheral enhancing rounded lesion in the dome of the right hepatic lobe, a less well-defined new subcapsular lesion in the lateral right hepatic lobe measuring 12 mm, a new subcapsular lesion in the anterior right hepatic lobe adjacent to the gallbladder fossa measuring 10 mm; a rounded low-density lesion in the inferior right hepatic lobe measuring 10 mm compared to 7 mm on the prior study (radiologist commented this may represent an enlarged cyst).   MRI of the abdomen 04/12/2013 confirmed multiple T2 hyperintense metastatic lesions throughout the right liver   Initiation of FOLFIRI/Avastin with genotype based irinotecan dosing per the [facility] study 04/05/2013.   Restaging CT evaluation on 06/20/2013 (after 2 cycles/4 treatments) showed improvement in the liver metastases and stable size of a left lower lobe pulmonary nodule now with central cavitation.   Continuation of FOLFIRI/Avastin.   Restaging CT evaluation 08/14/2013-decrease in the size of liver metastases, slight decrease in the size of a cavitary left lower lobe nodule, no evidence of disease progression.   Status post right hepatic lobectomy 09/17/2013. Pathology showed multiple foci of metastatic adenocarcinoma (5 nodules of metastatic adenocarcinoma 4 of which are subcapsular with the nodules ranging in size from 0.6-1.8 cm in greatest dimension). Margins not involved. Biopsy of a portal lymph node showed benign adipose tissue; no lymph node tissue or malignancy.   Normal CEA 11/06/2013   CT 11/06/2013 with a new pleural-based right lower chest lesion, slight in enlargement of the a left-sided lung nodule  CT 01/29/2014 with a decrease in the right lower chest pleural-based lesion and a slight increase of a left-sided lung lesion, other lung lesions were stable  CT chest 05/02/2014 with a decrease in the size of a right lower lobe pulmonary nodule months similar size of a dominant left lower lobe nodule, minimal enlargement  of a smaller left lower lobe nodule, no new site of disease  CT chest 11/07/2014 with a slight increase in several left-sided lung nodules  CT chest 02/10/2015 with new small left hilar lymph nodes, a possible new left lower lobe nodule, and stability of other lung nodules  PET scan 03/12/2015 with hypermetabolic left hilar nodes, hypermetabolic left lower lobe nodules, hyper metabolic retroperitoneal nodes, intense hypermetabolism at the coloanal anastomosis, and hypermetabolic thickening in the presacral space  Cycle 1 FOLFIRI/PANITUMUMAB 05/21/2015  Cycle 2 FOLFIRI/PANITUMUMAB 06/05/2015  Cycle 3 FOLFIRI/PANITUMUMAB 06/19/2015  Cycle 4 FOLFIRI 07/10/2015  Cycle 5 FOLFIRI 07/24/2015  Restaging CTs 08/06/2015-resolution of hilar/retroperitoneal adenopathy, improvement in the hypermetabolic lung nodule, other lung nodules are stable, no new lesions  Cycle 6 FOLFIRI with PANITUMUMAB 08/28/2015 -panitumumab dose reduced  Cycle 7 FOLFIRI 09/11/2015-no panitumumab given  Cycle 8 FOLFIRI with PANITUMUMAB 09/25/2015-PANITUMUMAB dose reduced  Cycle 9 FOLFIRI 10/09/2015-no PANITUMUMAB given  Cycle 10 FOLFIRI with panitumumab 10/23/2015  CTs 11/06/2015-possible slight enlargement of a left lower lobe nodule, no other evidence of disease progression.  CTs 02/16/2016-enlargement of left-sided pulmonary nodules and mediastinal/left hilar nodes, improved splenomegaly  CTs 06/21/2016-increased  size of mediastinal/left hilar nodes, increased left lung nodules, and increased soft tissue at the porta hepatis  CTs 10/28/2016-progressive disease in the chest and abdomen with an enlarging left hilar mass and slight interval enlargement of pulmonary lesions. Right upper quadrant necrotic nodal mass significantly increased in size with mass effect on the liver and possible invasion. Significant compression of the intrahepatic IVC. New right hepatic lobe lesion. Moderate pelvic ascites.  Cycle 1  FOLFIRI/PANITUMUMAB 11/04/2016  Cycle 2 FOLFIRI/PANITUMUMAB 11/18/2016  Cycle 3 FOLFIRI/panitumumab 12/02/2016  Cycle4 FOLFIRI/PANITUMUMAB 12/15/2016  Cycle 5 FOLFIRI/PANITUMUMAB 12/30/2016  CTs 01/06/2017-no evidence of progressive disease, decreased chest adenopathy, stable left lower lobe nodules, decreased liver metastasis, decreased right retroperitoneal mass  Cycle 6 FOLFIRI/panitumumab 01/13/2017  Cycle 7 FOLFIRI/Panitumumab 01/27/2017  Cycle 8 FOLFIRI/panitumumab 02/17/2017  Cycle 9 FOLFIRI/Panitumumab 03/03/2017  Cycle 10 FOLFIRI/Panitumumab 03/24/2017  CTs 04/06/2017-enlargement of right adrenal mass (smaller than October 2019), stable left hilar fullness and lung nodules  Cycle 11 FOLFIRI/Panitumumab 04/07/2017  Cycle 12 FOLFIRI/panitumumab 04/21/2017  Cycle 13 FOLFIRI/Panitumumab 05/05/2017  Palliative radiation to the right adrenal mass 226 2019-3 02/2018 2. History ofIrregular bowel habits/rectal bleeding secondary to #1. 3. History of Mild elevation of the liver enzymes. Question secondary to hepatic steatosis. 4. Indeterminate 8 mm posterior right liver lesion on the staging CT 03/06/2012. 5. Mildly elevated CEA at 5.7 on 03/06/2012. 6. History of radiation erythema at the groin and perineum 7. 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 No clinical evidence of thrombophlebitis or extravasation. 8. Delayed nausea following chemotherapy-Decadron prophylaxis was added with cycle 3 CAPOX-improved. 9. Ileostomy takedown 12/07/2012. 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 11. Port-A-Cath placement 12/31/204 12. Severe neutropenia secondary to chemotherapy following cycle 1 of FOLFIRI, chemotherapy was dose reduced and he received Neulasta with day 15 cycle 1  13. Nausea and vomiting following cycle 1 of FOLFIRI 14. Rectal stricture-manual/colonoscopic dilatation by Dr. Ardis Hughs  03/01/2016  APR 08/17/2016-no evidence of malignancy 15. History ofLeukopenia/Thrombocytopenia-persistent, potentially a sequelae of chemotherapy or hepatic toxicity from chemotherapy/radiation. Bone marrow biopsy 11/20/2014 showed cellular bone marrow with trilineage hematopoiesis. Significant dyspoiesis was not present and there was no evidence of metastatic carcinoma. Cytogenetic analysis showed the presence of normal male chromosomes with no observable clonal chromosomal abnormalities.  probable cirrhosis 16. Genetic testing-negative genetic panel in March 2014 17. 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 Severe over the face-steroid Dosepak prescribed 07/03/2015. Improved 07/10/2015. Further improved 07/24/2015, 08/07/2015, 08/28/2015 18. Diarrhea secondary to chemotherapy-encouraged to use Imodium  19. History of Paronychia secondary to PANITUMUMAB 20. 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 Neutropenia, thrombocytopenia 06/23/2017.  Possibly related to radiation.  He also has a history of suspected portal hypertension secondary to toxicity from chemotherapy.   Disposition: Jacob [name] has developed neurologic symptoms.  He will be referred for an urgent MRI of the brain. The neutrophil count has recovered.  We decided to hold FOLFIRI/panitumumab until the neurologic symptoms are further evaluated.  He will be scheduled for an office visit and chemotherapy in 1 week.  We will see him sooner pending the brain MRI.  30 minutes were spent with the patient today.  The majority of the time was used for counseling and coordination of care.  Betsy Coder, MD  07/28/2017  9:44 AM

## 2017-07-28 NOTE — Telephone Encounter (Signed)
Scheduled appt per 5/9 los - gave patient AVS and calender per los. - unable to schedule patient due to capped day - logged will contact when appt scheduled.

## 2017-07-29 ENCOUNTER — Telehealth: Payer: Self-pay | Admitting: Oncology

## 2017-07-29 ENCOUNTER — Telehealth: Payer: Self-pay | Admitting: *Deleted

## 2017-07-29 DIAGNOSIS — C20 Malignant neoplasm of rectum: Secondary | ICD-10-CM

## 2017-07-29 MED ORDER — DEXAMETHASONE 4 MG PO TABS: ORAL_TABLET | ORAL | 0 refills | Status: DC

## 2017-07-29 NOTE — Telephone Encounter (Signed)
Call from pt to follow up on prescription. Reviewed with MD, script e-scribed to pharmacy. Pt reports wife is there now to pick it up.

## 2017-07-29 NOTE — Telephone Encounter (Signed)
Scheduled appt per 5/09 los - pt is aware of appt date and time.

## 2017-07-30 ENCOUNTER — Inpatient Hospital Stay: Payer: BC Managed Care – PPO

## 2017-08-01 ENCOUNTER — Other Ambulatory Visit: Payer: Self-pay | Admitting: Radiation Therapy

## 2017-08-01 ENCOUNTER — Telehealth: Payer: Self-pay | Admitting: Radiation Oncology

## 2017-08-01 DIAGNOSIS — C7931 Secondary malignant neoplasm of brain: Secondary | ICD-10-CM

## 2017-08-01 DIAGNOSIS — C7949 Secondary malignant neoplasm of other parts of nervous system: Principal | ICD-10-CM

## 2017-08-01 NOTE — Telephone Encounter (Signed)
I called and spoke with the patient to let him know the discussion from Brain oncology conference this am. He is interested in proceeding with SRS approach. He will proceed with 3T MRI on Wednesday, and we will schedule him to see Dr. Vertell Limber. He is to come back on Friday to our office for simulation and was given times and dates for these appointments. We discussed the risks, benefits, short, and long term effects of radiotherapy, and the patient is interested in proceeding. He will call with questions and we will be working on a treatment date as well.      Carola Rhine, PAC

## 2017-08-02 ENCOUNTER — Telehealth: Payer: Self-pay | Admitting: *Deleted

## 2017-08-02 NOTE — Telephone Encounter (Signed)
Call from pt asking if he needs to keep 5/16 appts. He has MRI 5/15 and CT Sim 5/17. Reviewed with Dr. Benay Spice: Cancel lab/infusion. Pt should keep office visit appt.  Pt voiced understanding.

## 2017-08-03 ENCOUNTER — Ambulatory Visit
Admission: RE | Admit: 2017-08-03 | Discharge: 2017-08-03 | Disposition: A | Discharge status: Home / Self Care | Payer: BC Managed Care – PPO | Source: Ambulatory Visit | Attending: Radiation Oncology | Admitting: Radiation Oncology

## 2017-08-03 DIAGNOSIS — C7931 Secondary malignant neoplasm of brain: Secondary | ICD-10-CM

## 2017-08-03 DIAGNOSIS — C7949 Secondary malignant neoplasm of other parts of nervous system: Principal | ICD-10-CM

## 2017-08-03 MED ORDER — GADOBENATE DIMEGLUMINE 529 MG/ML IV SOLN
13.0000 mL | Freq: Once | INTRAVENOUS | Status: AC | PRN
  Administered 2017-08-03: 13 mL via INTRAVENOUS

## 2017-08-04 ENCOUNTER — Inpatient Hospital Stay: Payer: BC Managed Care – PPO

## 2017-08-04 ENCOUNTER — Encounter: Payer: Self-pay | Admitting: Nurse Practitioner

## 2017-08-04 ENCOUNTER — Telehealth: Payer: Self-pay | Admitting: Nurse Practitioner

## 2017-08-04 ENCOUNTER — Inpatient Hospital Stay (HOSPITAL_BASED_OUTPATIENT_CLINIC_OR_DEPARTMENT_OTHER): Payer: BC Managed Care – PPO | Admitting: Nurse Practitioner

## 2017-08-04 VITALS — BP 117/81 | HR 70 | Temp 97.5°F | Resp 18 | Ht 70.0 in | Wt 144.2 lb

## 2017-08-04 DIAGNOSIS — C20 Malignant neoplasm of rectum: Secondary | ICD-10-CM

## 2017-08-04 DIAGNOSIS — R911 Solitary pulmonary nodule: Secondary | ICD-10-CM | POA: Diagnosis not present

## 2017-08-04 DIAGNOSIS — R1310 Dysphagia, unspecified: Secondary | ICD-10-CM

## 2017-08-04 DIAGNOSIS — C7931 Secondary malignant neoplasm of brain: Secondary | ICD-10-CM

## 2017-08-04 DIAGNOSIS — E279 Disorder of adrenal gland, unspecified: Secondary | ICD-10-CM

## 2017-08-04 DIAGNOSIS — C787 Secondary malignant neoplasm of liver and intrahepatic bile duct: Secondary | ICD-10-CM

## 2017-08-04 DIAGNOSIS — Z5112 Encounter for antineoplastic immunotherapy: Secondary | ICD-10-CM | POA: Diagnosis not present

## 2017-08-04 NOTE — Telephone Encounter (Signed)
Unable to schedule appt per 5/16 los - due to capped day - logged - will contact patient when appt is scheduled.

## 2017-08-04 NOTE — Progress Notes (Addendum)
[place] OFFICE PROGRESS NOTE   Diagnosis: Rectal cancer  INTERVAL HISTORY:   Jacob [name] returns as scheduled.  He is scheduled for [facility] simulation 08/05/2017.  He is currently taking dexamethasone 4 mg twice daily.  The "blind spot" is less.  He has had no falls.  No balance issues.  Headaches are better.  Stable bowel habits.  He continues to have hoarseness.  He has intermittent dysphagia.  Objective:  Vital signs in last 24 hours:  Blood pressure 117/81, pulse 70, temperature (!) 97.5 F (36.4 C), temperature source Oral, resp. rate 18, height _0  (1.778 m), weight 144 lb 3.2 oz (65.4 kg), SpO2 100 %.    HEENT: No thrush or ulcers. Resp: Lungs clear bilaterally. Cardio: Regular rate and rhythm. GI: Abdomen soft and nontender.  No hepatomegaly.  Left lower quadrant colostomy. Vascular: No leg edema. Neuro: Alert and oriented. Port-A-Cath without erythema.  Lab Results:  Lab Results  Component Value Date   WBC 2.6 (L) 07/28/2017   HGB 10.2 (L) 07/28/2017   HCT 31.4 (L) 07/28/2017   MCV 90.5 07/28/2017   PLT 92 (L) 07/28/2017   NEUTROABS 1.9 07/28/2017    Imaging:  Mr Jeri Cos YI Contrast  Result Date: 08/03/2017 CLINICAL DATA:  Rectal cancer. Metastatic lesions for treatment planning. EXAM: MRI HEAD WITHOUT AND WITH CONTRAST TECHNIQUE: Multiplanar, multiecho pulse sequences of the brain and surrounding structures were obtained without and with intravenous contrast. CONTRAST:  56m MULTIHANCE GADOBENATE DIMEGLUMINE 529 MG/ML IV SOLN COMPARISON:  MRI head 07/28/2017 FINDINGS: Brain: Enhancing metastatic deposits in the brain similar to the recent study. Negative for intracranial hemorrhage Left occipital lesion 16.5 mm with necrosis and surrounding edema. Left occipital lesion 18 mm with heterogeneous enhancement and surrounding edema 5 mm left medial parietal lesion with mild edema 6 mm right frontal lesion with mild edema Ventricle size normal. No midline  shift. Negative for acute infarct. Vascular: Normal arterial flow voids Skull and upper cervical spine: Negative Sinuses/Orbits: Negative Other: None IMPRESSION: Four metastatic lesions in the brain unchanged from the recent MRI. Moderate edema in the left occipital lobe. No intracranial hemorrhage. Electronically Signed   By: CFranchot GalloM.D.   On: 08/03/2017 13:21    Medications: I have reviewed the patient's current medications.  Assessment/Plan: 1. Rectal cancer. Partially obstructing mass noted 1-2 cm from the anal verge on a colonoscopy 03/06/2012. Endoscopic ultrasound 03/14/2012 with a 7.5 mm thick, 3.2 cm wide hypoechoic, irregularly bordered mass that clearly passed into and through the muscularis propria layer of the distal rectal wall (uT3); 3 small (largest 7 mm) perirectal lymph nodes. The lymph nodes were all round, discrete, hypoechoic, homogenous; suspicious for malignant involvement (uN1).  [facility] resection specimen 06/29/2012, APC alteration identified, microsatellite stable  He began radiation and concurrent Xeloda chemotherapy on 03/20/2012, completed 04/27/2012.   Low anterior resection/coloanal anastomosis and diverting ileostomy 06/29/2012 with the final pathology revealing a T2N0 tumor with extensive fibrosis and negative margins.   Cycle 1 of adjuvant CAPOX 07/19/2012. Cycle 5 of adjuvant CAPOX 10/11/2012.   CEA 2.5 on 11/14/2012.   CEA 14.6 03/08/2013.   Restaging CT evaluation 03/08/2013 with a 4 mm pulmonary nodule left lung base not identified on comparison exam; new liver lesions including a 29 x 26 mm irregular peripheral enhancing rounded lesion in the dome of the right hepatic lobe, a less well-defined new subcapsular lesion in the lateral right hepatic  lobe measuring 12 mm, a new subcapsular lesion in the anterior right hepatic lobe adjacent to the gallbladder fossa measuring 10 mm; a rounded  low-density lesion in the inferior right hepatic lobe measuring 10 mm compared to 7 mm on the prior study (radiologist commented this may represent an enlarged cyst).   MRI of the abdomen 04/12/2013 confirmed multiple T2 hyperintense metastatic lesions throughout the right liver   Initiation of FOLFIRI/Avastin with genotype based irinotecan dosing per the [facility] study 04/05/2013.   Restaging CT evaluation on 06/20/2013 (after 2 cycles/4 treatments) showed improvement in the liver metastases and stable size of a left lower lobe pulmonary nodule now with central cavitation.   Continuation of FOLFIRI/Avastin.   Restaging CT evaluation 08/14/2013-decrease in the size of liver metastases, slight decrease in the size of a cavitary left lower lobe nodule, no evidence of disease progression.   Status post right hepatic lobectomy 09/17/2013. Pathology showed multiple foci of metastatic adenocarcinoma (5 nodules of metastatic adenocarcinoma 4 of which are subcapsular with the nodules ranging in size from 0.6-1.8 cm in greatest dimension). Margins not involved. Biopsy of a portal lymph node showed benign adipose tissue; no lymph node tissue or malignancy.   Normal CEA 11/06/2013   CT 11/06/2013 with a new pleural-based right lower chest lesion, slight in enlargement of the a left-sided lung nodule  CT 01/29/2014 with a decrease in the right lower chest pleural-based lesion and a slight increase of a left-sided lung lesion, other lung lesions were stable  CT chest 05/02/2014 with a decrease in the size of a right lower lobe pulmonary nodule months similar size of a dominant left lower lobe nodule, minimal enlargement of a smaller left lower lobe nodule, no new site of disease  CT chest 11/07/2014 with a slight increase in several left-sided lung nodules  CT chest 02/10/2015 with new small left hilar lymph nodes, a possible new left lower lobe nodule, and stability of other lung nodules  PET scan  03/12/2015 with hypermetabolic left hilar nodes, hypermetabolic left lower lobe nodules, hyper metabolic retroperitoneal nodes, intense hypermetabolism at the coloanal anastomosis, and hypermetabolic thickening in the presacral space  Cycle 1 FOLFIRI/PANITUMUMAB 05/21/2015  Cycle 2 FOLFIRI/PANITUMUMAB 06/05/2015  Cycle 3 FOLFIRI/PANITUMUMAB 06/19/2015  Cycle 4 FOLFIRI 07/10/2015  Cycle 5 FOLFIRI 07/24/2015  Restaging CTs 08/06/2015-resolution of hilar/retroperitoneal adenopathy, improvement in the hypermetabolic lung nodule, other lung nodules are stable, no new lesions  Cycle 6 FOLFIRI with PANITUMUMAB 08/28/2015 -panitumumab dose reduced  Cycle 7 FOLFIRI 09/11/2015-no panitumumab given  Cycle 8 FOLFIRI with PANITUMUMAB 09/25/2015-PANITUMUMAB dose reduced  Cycle 9 FOLFIRI 10/09/2015-no PANITUMUMAB given  Cycle 10 FOLFIRI with panitumumab 10/23/2015  CTs 11/06/2015-possible slight enlargement of a left lower lobe nodule, no other evidence of disease progression.  CTs 02/16/2016-enlargement of left-sided pulmonary nodules and mediastinal/left hilar nodes, improved splenomegaly  CTs 06/21/2016-increased size of mediastinal/left hilar nodes, increased left lung nodules, and increased soft tissue at the porta hepatis  CTs 10/28/2016-progressive disease in the chest and abdomen with an enlarging left hilar mass and slight interval enlargement of pulmonary lesions. Right upper quadrant necrotic nodal mass significantly increased in size with mass effect on the liver and possible invasion. Significant compression of the intrahepatic IVC. New right hepatic lobe lesion. Moderate pelvic ascites.  Cycle 1 FOLFIRI/PANITUMUMAB 11/04/2016  Cycle 2 FOLFIRI/PANITUMUMAB 11/18/2016  Cycle 3 FOLFIRI/panitumumab 12/02/2016  Cycle4 FOLFIRI/PANITUMUMAB 12/15/2016  Cycle 5 FOLFIRI/PANITUMUMAB 12/30/2016  CTs 01/06/2017-no evidence of progressive disease, decreased chest adenopathy, stable left  lower lobe nodules, decreased  liver metastasis, decreased right retroperitoneal mass  Cycle 6 FOLFIRI/panitumumab 01/13/2017  Cycle 7 FOLFIRI/Panitumumab 01/27/2017  Cycle 8 FOLFIRI/panitumumab 02/17/2017  Cycle 9 FOLFIRI/Panitumumab 03/03/2017  Cycle 10 FOLFIRI/Panitumumab 03/24/2017  CTs 04/06/2017-enlargement of right adrenal mass (smaller than October 2019), stable left hilar fullness and lung nodules  Cycle 11 FOLFIRI/Panitumumab 04/07/2017  Cycle 12 FOLFIRI/panitumumab 04/21/2017  Cycle 13 FOLFIRI/Panitumumab 05/05/2017  Palliative radiation to the right adrenal mass 226 2019-3 02/2018  Brain MRI 07/28/2017- 4 intracranial metastasis in the right frontal lobe, left parietal lobe and left occipital lobe.  Surrounding edema pronounced in the left occipital lobe. 2. History ofIrregular bowel habits/rectal bleeding secondary to #1. 3. History of Mild elevation of the liver enzymes. Question secondary to hepatic steatosis. 4. Indeterminate 8 mm posterior right liver lesion on the staging CT 03/06/2012. 5. Mildly elevated CEA at 5.7 on 03/06/2012. 6. History of radiation erythema at the groin and perineum 7. 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 No clinical evidence of thrombophlebitis or extravasation. 8. Delayed nausea following chemotherapy-Decadron prophylaxis was added with cycle 3 CAPOX-improved. 9. Ileostomy takedown 12/07/2012. 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 11. Port-A-Cath placement 12/31/204 12. Severe neutropenia secondary to chemotherapy following cycle 1 of FOLFIRI, chemotherapy was dose reduced and he received Neulasta with day 15 cycle 1  13. Nausea and vomiting following cycle 1 of FOLFIRI 14. Rectal stricture-manual/colonoscopic dilatation by Dr. Ardis Hughs 03/01/2016  APR 08/17/2016-no evidence of malignancy 15. History ofLeukopenia/Thrombocytopenia-persistent,  potentially a sequelae of chemotherapy or hepatic toxicity from chemotherapy/radiation. Bone marrow biopsy 11/20/2014 showed cellular bone marrow with trilineage hematopoiesis. Significant dyspoiesis was not present and there was no evidence of metastatic carcinoma. Cytogenetic analysis showed the presence of normal male chromosomes with no observable clonal chromosomal abnormalities.  probable cirrhosis 16. Genetic testing-negative genetic panel in March 2014 17. 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 Severe over the face-steroid Dosepak prescribed 07/03/2015. Improved 07/10/2015. Further improved 07/24/2015, 08/07/2015, 08/28/2015 18. Diarrhea secondary to chemotherapy-encouraged to use Imodium  19. History of Paronychia secondary to PANITUMUMAB 20. 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 Neutropenia, thrombocytopenia 06/23/2017. Possibly related to radiation. He also has a history of suspected portal hypertension secondary to toxicity from chemotherapy.   Disposition: Jacob [name] appears stable.  He was recently found to have brain metastases after developing neurologic symptoms.  The symptoms have improved since beginning dexamethasone.  He is scheduled for [facility] simulation 08/05/2017 and treatment 08/12/2017.  FOLFIRI/Panitumumab will be held until he has completed the [facility] treatment.  We are referring him for restaging CTs next week.  He will return for a follow-up visit on 08/18/2017 with plans to resume FOLFIRI/Panitumumab that day.  He will contact the office prior to his next visit with any problems.  ECOG performance status measured at 2.  Patient seen with Dr. Benay Spice.    Ned Card ANP/GNP-BC   2020-12-517  11:21 AM This was a shared visit with Ned Card.  Mr. Sperling has experienced improvement in the neurologic symptoms since beginning Decadron.  He is being scheduled for  [facility] treatment.  Systemic therapy remains on hold.  He will undergo restaging CTs prior to an office visit 08/18/2017.  The Decadron will be tapered as recommended by Drs. Moody and [employer].  Julieanne Manson, MD

## 2017-08-05 ENCOUNTER — Emergency Department (HOSPITAL_COMMUNITY): Payer: BC Managed Care – PPO

## 2017-08-05 ENCOUNTER — Ambulatory Visit
Admission: RE | Admit: 2017-08-05 | Discharge: 2017-08-05 | Disposition: A | Discharge status: Home / Self Care | Payer: BC Managed Care – PPO | Source: Ambulatory Visit | Attending: Radiation Oncology | Admitting: Radiation Oncology

## 2017-08-05 ENCOUNTER — Other Ambulatory Visit: Payer: Self-pay

## 2017-08-05 ENCOUNTER — Encounter (HOSPITAL_COMMUNITY): Payer: Self-pay | Admitting: Pharmacy Technician

## 2017-08-05 ENCOUNTER — Emergency Department (HOSPITAL_COMMUNITY)
Admission: EM | Admit: 2017-08-05 | Discharge: 2017-08-05 | Disposition: A | Discharge status: Home / Self Care | Payer: BC Managed Care – PPO | Attending: Emergency Medicine | Admitting: Emergency Medicine

## 2017-08-05 VITALS — BP 118/87 | HR 77 | Temp 98.0°F | Resp 18 | Ht 70.0 in | Wt 146.0 lb

## 2017-08-05 DIAGNOSIS — C7931 Secondary malignant neoplasm of brain: Secondary | ICD-10-CM

## 2017-08-05 DIAGNOSIS — R569 Unspecified convulsions: Secondary | ICD-10-CM | POA: Diagnosis not present

## 2017-08-05 DIAGNOSIS — C20 Malignant neoplasm of rectum: Secondary | ICD-10-CM | POA: Diagnosis not present

## 2017-08-05 DIAGNOSIS — C7971 Secondary malignant neoplasm of right adrenal gland: Secondary | ICD-10-CM

## 2017-08-05 DIAGNOSIS — Z85048 Personal history of other malignant neoplasm of rectum, rectosigmoid junction, and anus: Secondary | ICD-10-CM | POA: Diagnosis not present

## 2017-08-05 DIAGNOSIS — Z79899 Other long term (current) drug therapy: Secondary | ICD-10-CM | POA: Diagnosis not present

## 2017-08-05 DIAGNOSIS — Z510 Encounter for antineoplastic radiation therapy: Secondary | ICD-10-CM | POA: Insufficient documentation

## 2017-08-05 LAB — COMPREHENSIVE METABOLIC PANEL
ALT: 67 U/L — AB (ref 17–63)
ANION GAP: 9 (ref 5–15)
AST: 45 U/L — ABNORMAL HIGH (ref 15–41)
Albumin: 3.4 g/dL — ABNORMAL LOW (ref 3.5–5.0)
Alkaline Phosphatase: 71 U/L (ref 38–126)
BUN: 21 mg/dL — ABNORMAL HIGH (ref 6–20)
CHLORIDE: 105 mmol/L (ref 101–111)
CO2: 24 mmol/L (ref 22–32)
CREATININE: 0.95 mg/dL (ref 0.61–1.24)
Calcium: 9 mg/dL (ref 8.9–10.3)
GFR calc non Af Amer: >60 mL/min (ref >60)
Glucose, Bld: 106 mg/dL — ABNORMAL HIGH (ref 65–99)
POTASSIUM: 4.4 mmol/L (ref 3.5–5.1)
SODIUM: 138 mmol/L (ref 135–145)
Total Bilirubin: 0.6 mg/dL (ref 0.3–1.2)
Total Protein: 6.1 g/dL — ABNORMAL LOW (ref 6.5–8.1)

## 2017-08-05 LAB — CBC WITH DIFFERENTIAL/PLATELET
ABS IMMATURE GRANULOCYTES: 0.1 10*3/uL (ref 0.0–0.1)
BASOS ABS: 0 10*3/uL (ref 0.0–0.1)
BASOS PCT: 0 %
Eosinophils Absolute: 0 10*3/uL (ref 0.0–0.7)
Eosinophils Relative: 0 %
HCT: 38.6 % — ABNORMAL LOW (ref 39.0–52.0)
Hemoglobin: 12.5 g/dL — ABNORMAL LOW (ref 13.0–17.0)
Immature Granulocytes: 2 %
Lymphocytes Relative: 5 %
Lymphs Abs: 0.3 10*3/uL — ABNORMAL LOW (ref 0.7–4.0)
MCH: 28.7 pg (ref 26.0–34.0)
MCHC: 32.4 g/dL (ref 30.0–36.0)
MCV: 88.5 fL (ref 78.0–100.0)
MONO ABS: 0.3 10*3/uL (ref 0.1–1.0)
Monocytes Relative: 6 %
NEUTROS ABS: 4.8 10*3/uL (ref 1.7–7.7)
NEUTROS PCT: 87 %
PLATELETS: 145 10*3/uL — AB (ref 150–400)
RBC: 4.36 MIL/uL (ref 4.22–5.81)
RDW: 15.5 % (ref 11.5–15.5)
WBC: 5.5 10*3/uL (ref 4.0–10.5)

## 2017-08-05 LAB — CBG MONITORING, ED: GLUCOSE-CAPILLARY: 101 mg/dL — AB (ref 65–99)

## 2017-08-05 LAB — MAGNESIUM: Magnesium: 2.1 mg/dL (ref 1.7–2.4)

## 2017-08-05 MED ORDER — LIDOCAINE-PRILOCAINE 2.5-2.5 % EX CREA
TOPICAL_CREAM | Freq: Once | CUTANEOUS | Status: AC
  Administered 2017-08-05: 11:00:00 via TOPICAL
  Filled 2017-08-05: qty 5

## 2017-08-05 MED ORDER — SODIUM CHLORIDE 0.9% FLUSH
10.0000 mL | Freq: Once | INTRAVENOUS | Status: AC
  Administered 2017-08-05: 10 mL via INTRAVENOUS

## 2017-08-05 MED ORDER — LEVETIRACETAM IN NACL 1000 MG/100ML IV SOLN
1000.0000 mg | Freq: Once | INTRAVENOUS | Status: AC
  Administered 2017-08-05: 1000 mg via INTRAVENOUS
  Filled 2017-08-05: qty 100

## 2017-08-05 MED ORDER — DEXAMETHASONE 4 MG PO TABS: 4.0000 mg | ORAL_TABLET | Freq: Four times a day (QID) | ORAL | 0 refills | Status: AC

## 2017-08-05 MED ORDER — HEPARIN SOD (PORK) LOCK FLUSH 100 UNIT/ML IV SOLN
500.0000 [IU] | Freq: Once | INTRAVENOUS | Status: AC
  Administered 2017-08-05: 500 [IU] via INTRAVENOUS

## 2017-08-05 MED ORDER — LEVETIRACETAM 500 MG PO TABS: 500.0000 mg | ORAL_TABLET | Freq: Two times a day (BID) | ORAL | 0 refills | Status: DC

## 2017-08-05 NOTE — Consult Note (Addendum)
NEURO HOSPITALIST CONSULT NOTE   Requestig physician: Dr. Gerline Legacy   Reason for Consult: New onset seizure   History obtained from:  Patient     HPI:                                                                                                                                          Jacob [name] is an 47 y.o. 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  Patient notes that he had seen flashing lights approximately 2 days ago.  Today patient drove to I believe it was his brother's house, walked up to the porch and was about to sit down when his brother noted that he began laughing and then turned into a deep stare followed by clonic contracture of his arms with eyes straight open.  Fortunately the brother was very calm move the furniture away, and turned him on his side.  Seizure lasted for only approximately 1 to 3 minutes and EMS was called.  At time of consultation patient is now alert and oriented and back to baseline.  He does have multiple excoriations on his forehead arm and hands.  Past Medical History:  Diagnosis Date  . Allergic state 01/19/2012  . 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 Chicken pox as a child   ?  Marland Kitchen Dysrhythmia as child   HX OF IRREGULAR HB AT TIME OF TONSILLECTOMY - SURGERY WAS NOT DONE-CAN'T REMEMBER ANY OTHER DETAILS- NEVER HAD THE SURGERY.  . Elevated liver function tests 01/19/2012  . Fatty liver   . Hernia 6 months old  . History of diarrhea    better since chemo finished  . Hx of migraines   . Lung abnormality 2015  . Lung nodule 08/2013  . Lung nodule, multiple 11/29/2013  . Migraine headache as a child  . Numbness    TOES OF BOTH FEET.  Marland Kitchen Overweight(278.02) 01/19/2012  .  Pain    LEFT HEEL PAIN -SEVERE ESPECIALLY AFTER SITTING OR LYING DOWN - DIFFICULT TO GET OUT OF BED AND WALK IN THE MORNINGS BECAUSE OF HEEL PAIN  . 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 Preventative health care 01/19/2012  . Radiation 03/20/12-04/27/12   Pelvis 50.4 gray Rectal cancer  . Rectal cancer ([place]) 03/06/12   biopsy-adenocarcinoma  . Reflux 01/19/2012  . Sun-damaged skin 01/19/2012    Past Surgical History:  Procedure Laterality Date  . CHOLECYSTECTOMY N/A 09/17/2013   Procedure: CHOLECYSTECTOMY;  Surgeon: Dorris Fetch  Barry Dienes, MD;  Location: [facility];  Service: General;  Laterality: N/A;  . COLOSTOMY TAKEDOWN N/A 08/17/2016   Procedure: LAPAROSCOPIC ABDOMINOPERINEAL RESECTION WITH PERMANENT COLOSTOMY;  Surgeon: Leighton Ruff, MD;  Location: [facility];  Service: General;  Laterality: N/A;  . CYST EXCISION     L EAR AREA  . EUS  03/14/2012   Procedure: LOWER ENDOSCOPIC ULTRASOUND (EUS);  Surgeon: Milus Banister, MD;  Location: [facility];  Service: Endoscopy;  Laterality: N/A;  . HERNIA REPAIR  6 months old   right inguinal repair  . ILEOSTOMY CLOSURE N/A 12/07/2012   Procedure: ILEOSTOMY TAKEDOWN;  Surgeon: Leighton Ruff, MD;  Location: [facility];  Service: General;  Laterality: N/A;  . LAPAROSCOPIC LOW ANTERIOR RESECTION N/A 06/29/2012   Procedure: LAPAROSCOPIC LOW ANTERIOR RESECTION, mobilization splenic flexure,coloanal anastomosis,diverting ileostomy;  Surgeon: Leighton Ruff, MD;  Location: [facility];  Service: General;  Laterality: N/A;  . LAPAROSCOPY N/A 09/17/2013   Procedure: LAPAROSCOPY DIAGNOSTIC;  Surgeon: Stark Klein, MD;  Location: [facility];  Service: General;  Laterality: N/A;  . LIVER ULTRASOUND N/A 09/17/2013   Procedure: LIVER ULTRASOUND;  Surgeon: Stark Klein, MD;  Location: [facility];  Service: General;  Laterality: N/A;  . MICROLARYNGOSCOPY W/VOCAL CORD INJECTION Left 05/16/2017   Procedure: MICROLARYNGOSCOPY WITH VOCAL CORD INJECTION;   Surgeon: Melissa Montane, MD;  Location: [facility];  Service: ENT;  Laterality: Left;  . OPEN HEPATECTOMY  N/A 09/17/2013   Procedure: OPEN HEPATECTOMY;  Surgeon: Stark Klein, MD;  Location: [facility];  Service: General;  Laterality: N/A;  . PORTACATH PLACEMENT Left 03/21/2013   Procedure: INSERTION PORT-A-CATH;  Surgeon: Leighton Ruff, MD;  Location: [facility];  Service: General;  Laterality: Left;  . RECTAL BIOPSY  03/06/12   distal mass1-2cm from anal verge=Adenocarcinoma    Family History  Adopted: Yes  Problem Relation Age of Onset  . Cancer Father   . Heart disease Father   . Colon cancer Neg Hx             Social History:  reports that he has never smoked. He has never used smokeless tobacco. He reports that he drinks alcohol. He reports that he does not use drugs.  Allergies  Allergen Reactions  . Penicillins Rash    Childhood allergy Has patient had a PCN reaction causing immediate rash, facial/tongue/throat swelling, SOB or lightheadedness with hypotension: Yes Has patient had a PCN reaction causing severe rash involving mucus membranes or skin necrosis: Unknown Has patient had a PCN reaction that required hospitalization: No Has patient had a PCN reaction occurring within the last 10 years: No If all of the above answers are "NO", then may proceed with Cephalosporin use.     MEDICATIONS:                                                                                                                     Current Facility-Administered Medications  Medication Dose Route Frequency Provider Last Rate Last Dose  . levETIRAcetam (KEPPRA)  IVPB 1000 mg/100 mL premix  1,000 mg Intravenous Once Sherwood Gambler, MD       Current Outpatient Medications  Medication Sig Dispense Refill  . ALPRAZolam (XANAX) 0.25 MG tablet Take 1 tablet (0.25 mg total) by mouth at bedtime as needed for anxiety. 30 tablet 1  . dexamethasone (DECADRON) 4 MG tablet Take 2 tablets (8 mg total) by  mouth 2 (two) times daily with a meal for 2 days, THEN 1 tablet (4 mg total) 2 (two) times daily for 21 days. 50 tablet 0  . docusate sodium (STOOL SOFTENER) 100 MG capsule Take 200 mg by mouth daily.    Marland Kitchen lidocaine-prilocaine (EMLA) cream Apply 1 application topically as needed. Apply to port site 1 hour prior to use. 30 g 3  . minocycline (MINOCIN,DYNACIN) 100 MG capsule Take 1 capsule (100 mg total) by mouth 2 (two) times daily. 60 capsule 2  . Multiple Vitamin (MULTIVITAMIN WITH MINERALS) TABS tablet Take 1 tablet by mouth daily.    Marland Kitchen MYRBETRIQ 25 MG TB24 tablet Take 25 mg by mouth daily.     . naproxen sodium (ALEVE) 220 MG tablet Take 220 mg by mouth daily as needed (for headache). Reported on 08/28/2015    . oxyCODONE (OXY IR/ROXICODONE) 5 MG immediate release tablet Take 1 tablet (5 mg total) by mouth every 6 (six) hours as needed for severe pain. 30 tablet 0  . prochlorperazine (COMPAZINE) 10 MG tablet Take 1 tablet (10 mg total) by mouth every 6 (six) hours as needed for nausea or vomiting. 30 tablet 0   Facility-Administered Medications Ordered in Other Encounters  Medication Dose Route Frequency Provider Last Rate Last Dose  . atropine injection 0.5 mg  0.5 mg Intravenous Once PRN Betsy Coder B, MD      . atropine injection 0.5 mg  0.5 mg Intravenous Once PRN Ladell Pier, MD      . clindamycin (CLEOCIN) 900 mg in dextrose 5 % 50 mL IVPB  900 mg Intravenous 60 min Pre-Op Leighton Ruff, MD       And  . gentamicin (GARAMYCIN) 340 mg in dextrose 5 % 50 mL IVPB  5 mg/kg Intravenous 60 min Pre-Op Leighton Ruff, MD          ROS:                                                                                                                                       History obtained from the patient  General ROS: negative for - chills, fatigue, fever, night sweats, weight gain or weight loss Psychological ROS: negative for - behavioral disorder, hallucinations, memory difficulties,  mood swings or suicidal ideation Ophthalmic ROS: negative for - blurry vision, double vision, eye pain or loss of vision ENT ROS: negative for - epistaxis, nasal discharge, oral lesions, sore throat, tinnitus or vertigo Allergy and Immunology ROS: negative for - hives or  itchy/watery eyes Hematological and Lymphatic ROS: negative for - bleeding problems, bruising or swollen lymph nodes Endocrine ROS: negative for - galactorrhea, hair pattern changes, polydipsia/polyuria or temperature intolerance Respiratory ROS: negative for - cough, hemoptysis, shortness of breath or wheezing Cardiovascular ROS: negative for - chest pain, dyspnea on exertion, edema or irregular heartbeat Gastrointestinal ROS: negative for - abdominal pain, diarrhea, hematemesis, nausea/vomiting or stool incontinence Genito-Urinary ROS: negative for - dysuria, hematuria, incontinence or urinary frequency/urgency Musculoskeletal ROS: negative for - joint swelling or muscular weakness Neurological ROS: as noted in HPI Dermatological ROS: negative for rash and skin lesion changes   Blood pressure 129/86, pulse 80, temperature 98.1 F (36.7 C), temperature source Oral, resp. rate 17, SpO2 100 %.   General Examination:                                                                                                       Physical Exam  HEENT-  Normocephalic, excoriations on the right forehead, without obvious abnormality.  Normal external eye and conjunctiva.   Abdomen- All 4 quadrants palpated and nontender Extremities- Warm, dry and intact Musculoskeletal-no joint tenderness, deformity or swelling Skin-warm and dry, with multiple excoriations on his arm head and hands.  Neurological Examination Mental Status: Alert, oriented, thought content appropriate.  Speech fluent without evidence of aphasia.  Able to follow 3 step commands without difficulty. Cranial Nerves: II: Discs flat bilaterally; visual fields does show a  right lower field cut which per patient has gotten worse III,IV, VI: ptosis not present, extra-ocular motions intact bilaterally pupils equal, round, reactive to light and accommodation V,VII: smile symmetric, facial light touch sensation normal bilaterally VIII: hearing normal bilaterally IX,X: uvula rises symmetrically XI: bilateral shoulder shrug XII: midline tongue extension Motor: Right : Upper extremity   5/5    Left:     Upper extremity   5/5  Lower extremity   5/5     Lower extremity   5/5 Tone and bulk:normal tone throughout; no atrophy noted Sensory: Pinprick and light touch intact throughout, bilaterally Deep Tendon Reflexes: 1+ and symmetric throughout Plantars: Right: downgoing   Left: downgoing Cerebellar: normal finger-to-nose, and normal heel-to-shin test Gait: Not tested   Lab Results: Basic Metabolic Panel: Recent Labs  Lab 08/05/17 1612  NA 138  K 4.4  CL 105  CO2 24  GLUCOSE 106*  BUN 21*  CREATININE 0.95  CALCIUM 9.0  MG 2.1    CBC: Recent Labs  Lab 08/05/17 1612  WBC 5.5  NEUTROABS 4.8  HGB 12.5*  HCT 38.6*  MCV 88.5  PLT 145*    Assessment and plan per attending neurologist  Etta Quill PA-C [facility] [phone]  08/05/2017, 5:44 PM   Attending addendum Patient seen and examined with Etta Quill, PA-C. Likely seizure in the setting of meds I have reviewed imaging independently.  MRI done a few days ago shows left parieto-occipital metastatic lesion with surrounding vasogenic edema. CT head done today does not reveal any acute bleed.  Redemonstrates the lesions above.  Assessment 47 year old man with history of rectal  cancer with brain mets with new onset seizure. Seizure likely secondary to the metastases. His dexamethasone dose was recently reduced. I would go back up on it I think he still needs higher doses of dexamethasone and also will need antiepileptics.  Please see my recommendations  below  Impression: New onset seizure in the setting of brain mets Brain metastases from rectal cancer Rectal cancer  Recommendations: -Dexamethasone 4 mg every 6 hours -Load with Keppra 1 g IV x1 in ER -Continue Keppra 500 mg PO twice daily going forward -Patient's back to baseline and can be discharged home with outpatient follow-up with radiation oncology and neuro-oncology, Dr. Mickeal Skinner -[facility] [place] state law prep was driving and a 6 months seizure-free.  This was discussed in detail with the patient.  Detailed seizure precautions below. -Plan was relayed to the ED provider Dr. Regenia Skeeter. -Outpatient neurology follow-up with [facility] in 2 to 4 weeks if unable to see a neurologist.  I would defer this decision to patient's outpatient primary and radiation oncologist.  Seizure precautions Per [facility] statutes, patients with seizures are not allowed to drive until they have been seizure-free for six months.   Use caution when using heavy equipment or power tools. Avoid working on ladders or at heights. Take showers instead of baths. Ensure the water temperature is not too high on the home water heater. Do not go swimming alone. Do not lock yourself in a room alone (i.e. bathroom). When caring for infants or small children, sit down when holding, feeding, or changing them to minimize risk of injury to the child in the event you have a seizure. Maintain good sleep hygiene. Avoid alcohol.   If patient has another seizure, call 911 and bring them back to the ED if: A. The seizure lasts longer than 5 minutes.  B. The patient doesn't wake shortly after the seizure or has new problems such as difficulty seeing, speaking or moving following the seizure C. The patient was injured during the seizure D. The patient has a temperature over 102 F (39C) E. The patient vomited during the seizure and now is having trouble breathing   -- Amie Portland, MD [facility] Pager: [phone] If 7pm to 7am, please call on call as listed on [facility]ION.

## 2017-08-05 NOTE — Discharge Instructions (Addendum)
You may not drive until cleared by your brain specialist.  Take the seizure medicines as prescribed.  Return to the ER if you develop worsening headache or if you develop recurrent seizures.  We are changing your steroid dose from 1 tablet twice a day to 1 tablet 4 times a day.  Follow closely with your oncologist and radiation oncologist on 5/20.

## 2017-08-05 NOTE — Progress Notes (Signed)
Flushed per protocol. Power port needle removed. Patient tolerated well. Applied bandaid to old access site. Patient discharged home ambulatory in no distress.

## 2017-08-05 NOTE — ED Notes (Signed)
Pt verbalized understanding of discharge instrcutions

## 2017-08-05 NOTE — ED Triage Notes (Signed)
Pt arrives via EMS with reports of witnessed sz. Hx colon CA with mets to brain. Pt arrives A&OX4. Small abrasion to posterior head, arrives in [place]. AV hallucinations due to brain tumors. VSS with EMS. 18g LFA. 500cc NS given en route.

## 2017-08-05 NOTE — ED Notes (Signed)
Neurology at the bedside

## 2017-08-05 NOTE — ED Provider Notes (Signed)
[facility]PART[facility] Provider Note   CSN: [id number] Arrival date & time: 08/05/17  1552     History   Chief Complaint Chief Complaint  Patient presents with  . Seizures    HPI Jacob [name] is a 47 y.o. male.  HPI  47 year old male presents with a first-time seizure.  He has a history of rectal cancer and recently was diagnosed with brain metastases.  He is currently on Decadron for the last 2 weeks or so.  He first had symptoms of the brain metastasis with visual complaints.  The seem to have improved with the Decadron.  However while driving over at his friend's house he started having some lights in the bottom right of his visual field.  He was then walking up to the porch to see his friend and thus I think he remembers until waking up in the ambulance.  EMS tells me that the friend states that he sat down and then shortly thereafter started staring straight ahead outward and then laughed and then fell over.  There was no generalized tonic-clonic activity.  He was unresponsive for about 30 seconds.  However he was postictal and slow to recover for 10 or more minutes but now is back to normal.  He has some scrapes to his hand and mild swelling to his right forehead from the fall.  He denies neck pain.  He denies any other illness and currently does not have a headache.  Is starting radiation in about 1 week.  Past Medical History:  Diagnosis Date  . Allergic state 01/19/2012  . Cancer ([place]) 03/06/12   rectal bx=Adenocarcinoma  PT HAD RADIATION , CHEMO SURGERY  . Chicken pox as a child   ?  Marland Kitchen Dysrhythmia as child   HX OF IRREGULAR HB AT TIME OF TONSILLECTOMY - SURGERY WAS NOT DONE-CAN'T REMEMBER ANY OTHER DETAILS- NEVER HAD THE SURGERY.  . Elevated liver function tests 01/19/2012  . Fatty liver   . Hernia 6 months old  . History of diarrhea    better since chemo finished  . Hx of migraines   . Lung abnormality 2015  . Lung nodule 08/2013  . Lung  nodule, multiple 11/29/2013  . Migraine headache as a child  . Numbness    TOES OF BOTH FEET.  Marland Kitchen Overweight(278.02) 01/19/2012  . Pain    LEFT HEEL PAIN -SEVERE ESPECIALLY AFTER SITTING OR LYING DOWN - DIFFICULT TO GET OUT OF BED AND WALK IN THE MORNINGS BECAUSE OF HEEL PAIN  . 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 Preventative health care 01/19/2012  . Radiation 03/20/12-04/27/12   Pelvis 50.4 gray Rectal cancer  . Rectal cancer ([place]) 03/06/12   biopsy-adenocarcinoma  . Reflux 01/19/2012  . Sun-damaged skin 01/19/2012    Patient Active Problem List   Diagnosis Date Noted  . 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 Port catheter in place 08/28/2015  . Leukopenia   . Thrombocytopenia ([place])   . Lung nodule, multiple 11/29/2013  . 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[place]) 09/17/2013  . 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 Anemia 12/31/2012  . Grief reaction 06/18/2012  . Malignant neoplasm of rectum ([place]) 03/17/2012  . GERD (gastroesophageal reflux disease)   . Elevated liver function tests 01/19/2012  . Elevated BP 01/19/2012  . Sun-damaged skin 01/19/2012  Past Surgical History:  Procedure Laterality Date  . CHOLECYSTECTOMY N/A 09/17/2013   Procedure: CHOLECYSTECTOMY;  Surgeon: Stark Klein, MD;  Location: [facility];  Service: General;  Laterality: N/A;  . COLOSTOMY TAKEDOWN N/A 08/17/2016   Procedure: LAPAROSCOPIC ABDOMINOPERINEAL RESECTION WITH PERMANENT COLOSTOMY;  Surgeon: Leighton Ruff, MD;  Location: [facility];  Service: General;  Laterality: N/A;  . CYST EXCISION     L EAR AREA  . EUS  03/14/2012   Procedure: LOWER ENDOSCOPIC ULTRASOUND (EUS);  Surgeon: Milus Banister, MD;  Location: [facility];  Service: Endoscopy;  Laterality: N/A;  . HERNIA REPAIR  6 months old   right inguinal repair  . ILEOSTOMY  CLOSURE N/A 12/07/2012   Procedure: ILEOSTOMY TAKEDOWN;  Surgeon: Leighton Ruff, MD;  Location: [facility];  Service: General;  Laterality: N/A;  . LAPAROSCOPIC LOW ANTERIOR RESECTION N/A 06/29/2012   Procedure: LAPAROSCOPIC LOW ANTERIOR RESECTION, mobilization splenic flexure,coloanal anastomosis,diverting ileostomy;  Surgeon: Leighton Ruff, MD;  Location: [facility];  Service: General;  Laterality: N/A;  . LAPAROSCOPY N/A 09/17/2013   Procedure: LAPAROSCOPY DIAGNOSTIC;  Surgeon: Stark Klein, MD;  Location: [facility];  Service: General;  Laterality: N/A;  . LIVER ULTRASOUND N/A 09/17/2013   Procedure: LIVER ULTRASOUND;  Surgeon: Stark Klein, MD;  Location: [facility];  Service: General;  Laterality: N/A;  . MICROLARYNGOSCOPY W/VOCAL CORD INJECTION Left 05/16/2017   Procedure: MICROLARYNGOSCOPY WITH VOCAL CORD INJECTION;  Surgeon: Melissa Montane, MD;  Location: [facility];  Service: ENT;  Laterality: Left;  . OPEN HEPATECTOMY  N/A 09/17/2013   Procedure: OPEN HEPATECTOMY;  Surgeon: Stark Klein, MD;  Location: [facility];  Service: General;  Laterality: N/A;  . PORTACATH PLACEMENT Left 03/21/2013   Procedure: INSERTION PORT-A-CATH;  Surgeon: Leighton Ruff, MD;  Location: [facility];  Service: General;  Laterality: Left;  . RECTAL BIOPSY  03/06/12   distal mass1-2cm from anal verge=Adenocarcinoma        Home Medications    Prior to Admission medications   Medication Sig Start Date End Date Taking? Authorizing Provider  ALPRAZolam (XANAX) 0.25 MG tablet Take 1 tablet (0.25 mg total) by mouth at bedtime as needed for anxiety. 04/21/17  Yes Ladell Pier, MD  docusate sodium (STOOL SOFTENER) 100 MG capsule Take 200 mg by mouth daily.   Yes [provider]  lidocaine-prilocaine (EMLA) cream Apply 1 application topically as needed. Apply to port site 1 hour prior to use. 10/29/16  Yes Owens Shark, NP  minocycline (MINOCIN,DYNACIN) 100 MG capsule Take 1 capsule (100 mg total) by mouth 2 (two) times  daily. 06/29/17  Yes Owens Shark, NP  Multiple Vitamin (MULTIVITAMIN WITH MINERALS) TABS tablet Take 1 tablet by mouth daily.   Yes [provider]  MYRBETRIQ 25 MG TB24 tablet Take 25 mg by mouth daily.  12/01/16  Yes [provider]  dexamethasone (DECADRON) 4 MG tablet Take 1 tablet (4 mg total) by mouth 4 (four) times daily for 28 days. 08/05/17 09/02/17  Sherwood Gambler, MD  levETIRAcetam (KEPPRA) 500 MG tablet Take 1 tablet (500 mg total) by mouth 2 (two) times daily. 08/05/17   Sherwood Gambler, MD  naproxen sodium (ALEVE) 220 MG tablet Take 220 mg by mouth daily as needed (for headache). Reported on 08/28/2015    [provider]  oxyCODONE (OXY IR/ROXICODONE) 5 MG immediate release tablet Take 1 tablet (5 mg total) by mouth every 6 (six) hours as needed for severe pain. 03/24/17   Owens Shark, NP  prochlorperazine (COMPAZINE) 10 MG tablet Take 1 tablet (10 mg total) by mouth every 6 (six) hours as needed for nausea or vomiting. 11/04/16   Owens Shark, NP    Family History Family History  Adopted: Yes  Problem Relation Age of Onset  . Cancer Father   . Heart disease Father   . Colon cancer Neg Hx     Social History Social History   Tobacco Use  . Smoking status: Never Smoker  . Smokeless tobacco: Never Used  Substance Use Topics  . Alcohol use: Yes    Alcohol/week: 0.0 oz    Comment: RARE   . Drug use: No    Comment: marijuana past     Allergies   Penicillins   Review of Systems Review of Systems  Constitutional: Negative for fever.  Eyes: Positive for visual disturbance.  Gastrointestinal: Negative for vomiting.  Neurological: Positive for seizures and headaches (on and off, mild). Negative for weakness and numbness.  All other systems reviewed and are negative.    Physical Exam Updated Vital Signs BP (!) 125/97   Pulse 80   Temp 98.1 F (36.7 C) (Oral)   Resp 19   SpO2 100%   Physical Exam  Constitutional: He is oriented to  person, place, and time. He appears well-developed and well-nourished. No distress. Cervical collar in place.  HENT:  Head: Normocephalic. Head is without laceration.    Right Ear: External ear normal.  Left Ear: External ear normal.  Nose: Nose normal.  Eyes: Pupils are equal, round, and reactive to light. EOM are normal. Right eye exhibits no discharge. Left eye exhibits no discharge.  Neck: Normal range of motion. Neck supple. No spinous process tenderness present. Normal range of motion present.  Cardiovascular: Normal rate, regular rhythm and normal heart sounds.  Pulmonary/Chest: Effort normal and breath sounds normal.  Abdominal: Soft. He exhibits no distension. There is no tenderness.  Musculoskeletal: He exhibits no edema.       Right elbow: He exhibits laceration (abrasion). He exhibits normal range of motion. No tenderness found.       Right hand: He exhibits laceration (abrasions). He exhibits normal range of motion and no tenderness.       Hands: CN 3-12 grossly intact. 5/5 strength in all 4 extremities. Grossly normal sensation. Normal finger to nose.   Neurological: He is alert and oriented to person, place, and time.  Skin: Skin is warm and dry. He is not diaphoretic.  Nursing note and vitals reviewed.    ED Treatments / Results  Labs (all labs ordered are listed, but only abnormal results are displayed) Labs Reviewed  CBC WITH DIFFERENTIAL/PLATELET - Abnormal; Notable for the following components:      Result Value   Hemoglobin 12.5 (*)    HCT 38.6 (*)    Platelets 145 (*)    Lymphs Abs 0.3 (*)    All other components within normal limits  COMPREHENSIVE METABOLIC PANEL - Abnormal; Notable for the following components:   Glucose, Bld 106 (*)    BUN 21 (*)    Total Protein 6.1 (*)    Albumin 3.4 (*)    AST 45 (*)    ALT 67 (*)    All other components within normal limits  CBG MONITORING, ED - Abnormal; Notable for the following components:    Glucose-Capillary 101 (*)    All other components within normal limits  MAGNESIUM    EKG EKG Interpretation  Date/Time:  Friday Aug 05 2017 16:02:27 EDT Ventricular Rate:  86 PR Interval:    QRS Duration: 80 QT Interval:  337 QTC Calculation: 403 R Axis:   36 Text Interpretation:  Normal sinus rhythm Borderline low voltage, extremity leads no significant change since 2014 Confirmed by Sherwood Gambler [phone]) on 08/05/2017 4:14:22 PM   Radiology Ct Head Wo Contrast  Result Date: 08/05/2017 CLINICAL DATA:  Witnessed seizure. History of colon carcinoma with metastatic disease to the brain. EXAM: CT HEAD WITHOUT CONTRAST TECHNIQUE: Contiguous axial images were obtained from the base of the skull through the vertex without intravenous contrast. COMPARISON:  Brain MRI, 08/03/2017. FINDINGS: Brain: There are 2 adjacent hyperattenuating masses in the left occipital and parietal lobes reflecting the 2 larger enhancing metastatic lesions noted on the current brain MRI. Largest lesion measures 1.8 cm transversely as it did on the prior MRI. The 2 smaller metastatic lesions, 1 in the more superior left parietal lobe and the other in the right frontal lobe not defined on CT. There is no evidence of an ischemic infarct. No intracranial hemorrhage. Vasogenic edema surrounding the left parietooccipital metastatic lesions is stable from the recent prior MRI. Ventricles are normal in size and configuration Vascular: No hyperdense vessel or unexpected calcification. Skull: No skull lesions. Sinuses/Orbits: Globes and orbits are unremarkable. The visualized sinuses and mastoid air cells are clear. Other: None. IMPRESSION: 1. No acute intracranial abnormalities. 2. Left parietooccipital metastatic lesions with associated vasogenic edema are stable compared to the recent brain MRI. Two of the small metastatic brain lesions noted on the prior MRI are not resolved on CT. Electronically Signed   By: Lajean Manes M.D.    On: 08/05/2017 16:51    Procedures Procedures (including critical care time)  Medications Ordered in ED Medications  levETIRAcetam (KEPPRA) IVPB 1000 mg/100 mL premix (0 mg Intravenous Stopped 08/05/17 1803)     Initial Impression / Assessment and Plan / ED Course  I have reviewed the triage vital signs and the nursing notes.  Pertinent labs & imaging results that were available during my care of the patient were reviewed by me and considered in my medical decision making (see chart for details).     Patient has had no further seizure-like activity.  CT head shows no worsening of the metastatic lesions.  His lab work does not show any acute other obvious reason to cause seizure.  neuro exam without obvious findings.  Discussed with neurology, Dr. Rory Percy who recommends 1 g IV Keppra load, as well as increasing his Decadron from 4 mg twice daily up to 4 mg 4 times a day.  Start on Keppra 500 mill grams twice daily.  He was informed he cannot drive until cleared by a neurologist.  He will need to follow-up closely with his radiation oncologist next week.  We discussed strict return precautions but he appears stable for discharge home.  Final Clinical Impressions(s) / ED Diagnoses   Final diagnoses:  Seizure [facility])    ED Discharge Orders        Ordered    dexamethasone (DECADRON) 4 MG tablet  4 times daily     08/05/17 1815    levETIRAcetam (KEPPRA) 500 MG tablet  2 times daily     08/05/17 1815       Sherwood Gambler, MD 08/05/17 [phone]

## 2017-08-05 NOTE — Progress Notes (Signed)
Has armband been applied?  Yes  Does patient have an allergy to IV contrast dye?: No   Has patient ever received premedication for IV contrast dye?: N/A  Does patient take metformin?: No  If patient does take metformin when was the last dose: N/A  Date of lab work: 07/14/17 BUN: 10 CR: 0.78 EGFR: >60  IV site: left chest PAC  Has IV site been added to flowsheet?  Yes  BP 118/87   Pulse 77   Temp 98 F (36.7 C)   Resp 18   Ht '5\' 10"'  (1.778 m)   Wt 146 lb (66.2 kg)   SpO2 100% Comment: room air  BMI 20.95 kg/m

## 2017-08-05 NOTE — ED Notes (Signed)
Patient transported to CT 

## 2017-08-09 ENCOUNTER — Telehealth: Payer: Self-pay | Admitting: Oncology

## 2017-08-09 ENCOUNTER — Telehealth: Payer: Self-pay

## 2017-08-09 NOTE — Telephone Encounter (Signed)
Received call from [place] at [facility] E[facility] with Dr. Janace Hoard requesting medical clearance for laryngoplasty. LVM that Per Dr. Benay Spice, pt is clear from an oncology standpoint, also informed that pt has brain mets and recently had a seizure. Call back number provided

## 2017-08-09 NOTE — Telephone Encounter (Signed)
Scheduled appt per 5/16 los - unable to schedule treatment on 5/30 due to cap - scheduled for 5/31 - okay per GBS . Left message with appt date and time for patient

## 2017-08-10 ENCOUNTER — Other Ambulatory Visit: Payer: Self-pay | Admitting: Nurse Practitioner

## 2017-08-10 ENCOUNTER — Ambulatory Visit (HOSPITAL_COMMUNITY)
Admission: RE | Admit: 2017-08-10 | Discharge: 2017-08-10 | Disposition: A | Discharge status: Home / Self Care | Payer: BC Managed Care – PPO | Source: Ambulatory Visit | Attending: Nurse Practitioner | Admitting: Nurse Practitioner

## 2017-08-10 DIAGNOSIS — R918 Other nonspecific abnormal finding of lung field: Secondary | ICD-10-CM | POA: Diagnosis not present

## 2017-08-10 DIAGNOSIS — Z933 Colostomy status: Secondary | ICD-10-CM | POA: Insufficient documentation

## 2017-08-10 DIAGNOSIS — C787 Secondary malignant neoplasm of liver and intrahepatic bile duct: Secondary | ICD-10-CM | POA: Diagnosis present

## 2017-08-10 DIAGNOSIS — C20 Malignant neoplasm of rectum: Secondary | ICD-10-CM | POA: Insufficient documentation

## 2017-08-10 DIAGNOSIS — R590 Localized enlarged lymph nodes: Secondary | ICD-10-CM | POA: Insufficient documentation

## 2017-08-10 DIAGNOSIS — C7931 Secondary malignant neoplasm of brain: Secondary | ICD-10-CM | POA: Diagnosis not present

## 2017-08-10 MED ORDER — IOPAMIDOL (ISOVUE-300) INJECTION 61%
INTRAVENOUS | Status: AC
  Filled 2017-08-10: qty 100

## 2017-08-10 MED ORDER — LEVOFLOXACIN 500 MG PO TABS: 500.0000 mg | ORAL_TABLET | Freq: Every day | ORAL | 0 refills | Status: DC

## 2017-08-10 MED ORDER — IOPAMIDOL (ISOVUE-300) INJECTION 61%
100.0000 mL | Freq: Once | INTRAVENOUS | Status: AC | PRN
  Administered 2017-08-10: 100 mL via INTRAVENOUS

## 2017-08-11 ENCOUNTER — Telehealth: Payer: Self-pay

## 2017-08-11 NOTE — Telephone Encounter (Addendum)
LVM on cell phone with info below. Call back number provided for pt to call if needed   ----- Message from Owens Shark, NP sent at 08/10/2017 12:51 PM EDT ----- Please let him know the chest CT shows possible pneumonia.  I am sending a prescription for an antibiotic to his pharmacy.   Please see if he is having fever, cough, shortness of breath. If not instruct him to call if he develops any of these. Please see if he is having fever, cough, shortness of breath. If not instruct him to call if he develops any of these.

## 2017-08-12 ENCOUNTER — Encounter: Payer: Self-pay | Admitting: Radiation Oncology

## 2017-08-12 ENCOUNTER — Ambulatory Visit
Admission: RE | Admit: 2017-08-12 | Discharge: 2017-08-12 | Disposition: A | Discharge status: Home / Self Care | Payer: BC Managed Care – PPO | Source: Ambulatory Visit | Attending: Radiation Oncology | Admitting: Radiation Oncology

## 2017-08-12 ENCOUNTER — Telehealth: Payer: Self-pay

## 2017-08-12 DIAGNOSIS — C7931 Secondary malignant neoplasm of brain: Secondary | ICD-10-CM | POA: Diagnosis not present

## 2017-08-12 NOTE — Telephone Encounter (Signed)
Returned call to pt regarding radiation appt. Per Dr. Benay Spice, pt to keep radiation appts as scheduled. Pt wife voiced understanding

## 2017-08-12 NOTE — Progress Notes (Signed)
Instructions to decrease decadron Beginning next Monday (5/27), decrease to 1 tablet x3 per day for 5 days. Then take 1 tablet x2 per day for 5 days. Then take 1 tablet x1 per day for 5 days. Then take 1/2 tablet x1 per day for 5 days.

## 2017-08-12 NOTE — Progress Notes (Signed)
Received patient in the clinic following 1 of 1 SRS treatments. Patient accompanied by wife and daughter. No distress noted. Steady gait noted. Denies nausea, diplopia, tinnitus or headache. Vitals stable. Patient to rest for 30 minutes while Dr. Lisbeth Renshaw prepares decadron taper instructions.

## 2017-08-12 NOTE — Op Note (Signed)
  Name: KNUT RONDINELLI  MRN: [id number]  Date: 08/12/2017   DOB: 02/23/1971  Stereotactic Radiosurgery Operative Note  PRE-OPERATIVE DIAGNOSIS:  Multiple Brain Metastases  POST-OPERATIVE DIAGNOSIS:  Multiple Brain Metastases  PROCEDURE:  Stereotactic Radiosurgery  SURGEON:  Peggyann Shoals, MD  NARRATIVE: The patient underwent a radiation treatment planning session in the radiation oncology simulation suite under the care of the radiation oncology physician and physicist.  I participated closely in the radiation treatment planning afterwards. The patient underwent planning CT which was fused to 3T high resolution MRI with 1 mm axial slices.  These images were fused on the planning system.  We contoured the gross target volumes and subsequently expanded this to yield the Planning Target Volume. I actively participated in the planning process.  I helped to define and review the target contours and also the contours of the optic pathway, eyes, brainstem and selected nearby organs at risk.  All the dose constraints for critical structures were reviewed and compared to AAPM Task Group 101.  The prescription dose conformity was reviewed.  I approved the plan electronically.    Accordingly, Renold Genta was brought to the TrueBeam stereotactic radiation treatment linac and placed in the custom immobilization mask.  The patient was aligned according to the IR fiducial markers with BrainLab Exactrac, then orthogonal x-rays were used in ExacTrac with the 6DOF robotic table and the shifts were made to align the patient  Renold Genta received stereotactic radiosurgery uneventfully.    Lesions treated:  4   Complex lesions treated:  0 (>3.5 cm, <43mm of optic path, or within the brainstem)   The detailed description of the procedure is recorded in the radiation oncology procedure note.  I was present for the duration of the procedure.  DISPOSITION:  Following delivery, the patient was transported to nursing in  stable condition and monitored for possible acute effects to be discharged to home in stable condition with follow-up in one month.  Peggyann Shoals, MD 08/12/2017 5:16 PM

## 2017-08-17 DIAGNOSIS — C7931 Secondary malignant neoplasm of brain: Secondary | ICD-10-CM | POA: Insufficient documentation

## 2017-08-17 NOTE — Progress Notes (Signed)
  Radiation Oncology         [phone] ________________________________  Name: Jacob [name] MRN: [id number]  Date: 08/12/2017  DOB: 1970/08/24   SPECIAL TREATMENT PROCEDURE   3D TREATMENT PLANNING AND DOSIMETRY: The patient's radiation plan was reviewed and approved by Dr. Vertell Limber from neurosurgery and radiation oncology prior to treatment. It showed 3-dimensional radiation distributions overlaid onto the planning CT/MRI image set. The [facility] for the target structures as well as the organs at risk were reviewed. The documentation of the 3D plan and dosimetry are filed in the radiation oncology EMR.   NARRATIVE: The patient was brought to the TrueBeam stereotactic radiation treatment machine and placed supine on the CT couch. The head frame was applied, and the patient was set up for stereotactic radiosurgery. Neurosurgery was present for the set-up and delivery   SIMULATION VERIFICATION: In the couch zero-angle position, the patient underwent Exactrac imaging using the Brainlab system with orthogonal KV images. These were carefully aligned and repeated to confirm treatment position for each of the isocenters. The Exactrac snap film verification was repeated at each couch angle.   SPECIAL TREATMENT PROCEDURE: The patient received stereotactic radiosurgery to the following target:  PTV1-4 target was treated using 6 Arcs to a prescription dose of 20 Gy. ExacTrac Snap verification was performed for each couch angle.   STEREOTACTIC TREATMENT MANAGEMENT: Following delivery, the patient was transported to nursing in stable condition and monitored for possible acute effects. Vital signs were recorded . The patient tolerated treatment without significant acute effects, and was discharged to home in stable condition.  PLAN: Follow-up in one month.   ------------------------------------------------  Jodelle Gross, MD, PhD

## 2017-08-17 NOTE — Progress Notes (Signed)
  Radiation Oncology         [phone] ________________________________  Name: JAHLANI LORENTZ MRN: [id number]  Date: 08/05/2017  DOB: 1970/04/07  DIAGNOSIS:     ICD-10-CM   1. Metastasis to brain [facility]) C79.31   2. Brain metastasis ([place]) C79.31     NARRATIVE:  The patient was brought to the [facility].  Identity was confirmed.  All relevant records and images related to the planned course of therapy were reviewed.  The patient freely provided informed written consent to proceed with treatment after reviewing the details related to the planned course of therapy. The consent form was witnessed and verified by the simulation staff. Intravenous access was established for contrast administration. Then, the patient was set-up in a stable reproducible supine position for radiation therapy.  A relocatable thermoplastic stereotactic head frame was fabricated for precise immobilization.  CT images were obtained.  Surface markings were placed.  The CT images were loaded into the planning software and fused with the patient's targeting MRI scan.  Then the target and avoidance structures were contoured.  Treatment planning then occurred.  The radiation prescription was entered and confirmed.  I have requested 3D planning  I have requested a DVH of the following structures: Brain stem, brain, left eye, right eye, lenses, optic chiasm, target volumes, uninvolved brain, and normal tissue.    SPECIAL TREATMENT PROCEDURE:  The planned course of therapy using radiation constitutes a special treatment procedure. Special care is required in the management of this patient for the following reasons. This treatment constitutes a Special Treatment Procedure for the following reason: High dose per fraction requiring special monitoring for increased toxicities of treatment including daily imaging.  The special nature of the planned course of radiotherapy will require increased physician supervision and oversight  to ensure patient's safety with optimal treatment outcomes.  PLAN:  The patient will receive 20 Gy in 1 fraction to the 4 brain mets.   ------------------------------------------------  Jodelle Gross, MD, PhD

## 2017-08-18 NOTE — Progress Notes (Signed)
  Radiation Oncology         [phone] ________________________________  Name: Jacob [name] MRN: [id number]  Date: 08/12/2017  DOB: 1970-12-04  End of Treatment Note  Diagnosis:   47 y.o. male with 4 brain metastases     Indication for treatment:  palliative       Radiation treatment dates:   08/12/2017  Site/dose:   Brain PTV1-4 // 20 Gy in 1 fraction 1. PTV1: Left Occipital 24mm  2. PTV2: Left Occipital 38mm  3. PTV3: Left Parietal 65mm  4. PTV4: Right Frontal 61mm   Beams/energy:   SBRT/SRT-VMAT // 6X-FFF Photon  Narrative: The patient tolerated radiation treatment well.   There were no signs of acute toxicity after treatment.  Plan: The patient has completed radiation treatment. The patient will return to radiation oncology clinic for routine followup in one month. I advised the patient to call or return sooner if they have any questions or concerns related to their recovery or treatment. ________________________________  Jodelle Gross, MD, PhD  This document serves as a record of services personally performed by Kyung Rudd, MD. It was created on his behalf by Rae Lips, a trained medical scribe. The creation of this record is based on the scribe's personal observations and the provider's statements to them. This document has been checked and approved by the attending provider.

## 2017-08-19 ENCOUNTER — Inpatient Hospital Stay: Payer: BC Managed Care – PPO

## 2017-08-19 ENCOUNTER — Telehealth: Payer: Self-pay | Admitting: Oncology

## 2017-08-19 ENCOUNTER — Inpatient Hospital Stay: Payer: BC Managed Care – PPO | Admitting: Oncology

## 2017-08-19 ENCOUNTER — Encounter: Payer: Self-pay | Admitting: Oncology

## 2017-08-19 VITALS — BP 117/86 | HR 60 | Temp 97.9°F | Resp 17 | Ht 70.0 in | Wt 150.6 lb

## 2017-08-19 DIAGNOSIS — E279 Disorder of adrenal gland, unspecified: Secondary | ICD-10-CM

## 2017-08-19 DIAGNOSIS — R490 Dysphonia: Secondary | ICD-10-CM | POA: Diagnosis not present

## 2017-08-19 DIAGNOSIS — R911 Solitary pulmonary nodule: Secondary | ICD-10-CM

## 2017-08-19 DIAGNOSIS — C20 Malignant neoplasm of rectum: Secondary | ICD-10-CM

## 2017-08-19 DIAGNOSIS — C787 Secondary malignant neoplasm of liver and intrahepatic bile duct: Principal | ICD-10-CM

## 2017-08-19 DIAGNOSIS — C7931 Secondary malignant neoplasm of brain: Secondary | ICD-10-CM

## 2017-08-19 DIAGNOSIS — F419 Anxiety disorder, unspecified: Secondary | ICD-10-CM

## 2017-08-19 DIAGNOSIS — R0602 Shortness of breath: Secondary | ICD-10-CM | POA: Diagnosis not present

## 2017-08-19 DIAGNOSIS — Z5112 Encounter for antineoplastic immunotherapy: Secondary | ICD-10-CM | POA: Diagnosis not present

## 2017-08-19 DIAGNOSIS — Z95828 Presence of other vascular implants and grafts: Secondary | ICD-10-CM

## 2017-08-19 LAB — CBC WITH DIFFERENTIAL (CANCER CENTER ONLY)
Basophils Absolute: 0 10*3/uL (ref 0.0–0.1)
Basophils Relative: 0 %
EOS PCT: 0 %
Eosinophils Absolute: 0 10*3/uL (ref 0.0–0.5)
HCT: 37.5 % — ABNORMAL LOW (ref 38.4–49.9)
HEMOGLOBIN: 12.3 g/dL — AB (ref 13.0–17.1)
LYMPHS ABS: 0.2 10*3/uL — AB (ref 0.9–3.3)
LYMPHS PCT: 3 %
MCH: 29.5 pg (ref 27.2–33.4)
MCHC: 32.9 g/dL (ref 32.0–36.0)
MCV: 89.9 fL (ref 79.3–98.0)
Monocytes Absolute: 0.3 10*3/uL (ref 0.1–0.9)
Monocytes Relative: 5 %
Neutro Abs: 6.6 10*3/uL — ABNORMAL HIGH (ref 1.5–6.5)
Neutrophils Relative %: 92 %
Platelet Count: 71 10*3/uL — ABNORMAL LOW (ref 140–400)
RBC: 4.18 MIL/uL — AB (ref 4.20–5.82)
RDW: 18.6 % — ABNORMAL HIGH (ref 11.0–14.6)
WBC: 7.2 10*3/uL (ref 4.0–10.3)

## 2017-08-19 LAB — CMP (CANCER CENTER ONLY)
ALBUMIN: 3.5 g/dL (ref 3.5–5.0)
ALT: 89 U/L — ABNORMAL HIGH (ref 0–55)
AST: 45 U/L — AB (ref 5–34)
Alkaline Phosphatase: 59 U/L (ref 40–150)
Anion gap: 6 (ref 3–11)
BUN: 28 mg/dL — AB (ref 7–26)
CHLORIDE: 100 mmol/L (ref 98–109)
CO2: 26 mmol/L (ref 22–29)
Calcium: 8.4 mg/dL (ref 8.4–10.4)
Creatinine: 0.81 mg/dL (ref 0.70–1.30)
GFR, Est AFR Am: >60 mL/min (ref >60)
Glucose, Bld: 111 mg/dL (ref 70–140)
POTASSIUM: 4.2 mmol/L (ref 3.5–5.1)
SODIUM: 132 mmol/L — AB (ref 136–145)
Total Bilirubin: 0.4 mg/dL (ref 0.2–1.2)
Total Protein: 6 g/dL — ABNORMAL LOW (ref 6.4–8.3)

## 2017-08-19 LAB — CEA (IN HOUSE-CHCC): CEA (CHCC-IN HOUSE): 37.72 ng/mL — AB (ref 0.00–5.00)

## 2017-08-19 MED ORDER — SODIUM CHLORIDE 0.9 % IV SOLN
Freq: Once | INTRAVENOUS | Status: AC
  Administered 2017-08-19: 12:00:00 via INTRAVENOUS
  Filled 2017-08-19: qty 5

## 2017-08-19 MED ORDER — LEUCOVORIN CALCIUM INJECTION 350 MG
400.0000 mg/m2 | Freq: Once | INTRAVENOUS | Status: AC
  Administered 2017-08-19: 736 mg via INTRAVENOUS
  Filled 2017-08-19: qty 36.8

## 2017-08-19 MED ORDER — SODIUM CHLORIDE 0.9 % IJ SOLN
10.0000 mL | INTRAMUSCULAR | Status: DC | PRN
  Administered 2017-08-19: 10 mL via INTRAVENOUS
  Filled 2017-08-19: qty 10

## 2017-08-19 MED ORDER — PALONOSETRON HCL INJECTION 0.25 MG/5ML
INTRAVENOUS | Status: AC
  Filled 2017-08-19: qty 5

## 2017-08-19 MED ORDER — SODIUM CHLORIDE 0.9 % IV SOLN
2350.0000 mg/m2 | INTRAVENOUS | Status: DC
  Administered 2017-08-19: 4300 mg via INTRAVENOUS
  Filled 2017-08-19: qty 86

## 2017-08-19 MED ORDER — IRINOTECAN HCL CHEMO INJECTION 100 MG/5ML
100.0000 mg/m2 | Freq: Once | INTRAVENOUS | Status: AC
  Administered 2017-08-19: 180 mg via INTRAVENOUS
  Filled 2017-08-19: qty 9

## 2017-08-19 MED ORDER — SODIUM CHLORIDE 0.9 % IV SOLN
Freq: Once | INTRAVENOUS | Status: AC
  Administered 2017-08-19: 12:00:00 via INTRAVENOUS

## 2017-08-19 MED ORDER — PANITUMUMAB CHEMO INJECTION 100 MG/5ML
4.0000 mg/kg | Freq: Once | INTRAVENOUS | Status: AC
  Administered 2017-08-19: 260 mg via INTRAVENOUS
  Filled 2017-08-19: qty 13

## 2017-08-19 MED ORDER — PALONOSETRON HCL INJECTION 0.25 MG/5ML
0.2500 mg | Freq: Once | INTRAVENOUS | Status: AC
  Administered 2017-08-19: 0.25 mg via INTRAVENOUS

## 2017-08-19 MED ORDER — SODIUM CHLORIDE 0.9 % IV SOLN: 300.0000 mg | Freq: Once | INTRAVENOUS | Status: DC

## 2017-08-19 NOTE — Progress Notes (Signed)
Ok to treat with labs from today per Dr. Benay Spice. Dose will be reduced. Patient will be instructed to turn off pump on Sunday. He will have an injection appt on Monday for pump d/c and neulasta injection.

## 2017-08-19 NOTE — Progress Notes (Addendum)
[name] OFFICE PROGRESS NOTE   Diagnosis: Rectal cancer  INTERVAL HISTORY:   Jacob [name] returns as scheduled.  He underwent SBRT to for brain lesions on 08/12/2017.  He continues a Decadron taper.  He reports improvement in the visual symptoms. He has noted increased anxiety.  He continues to have hoarseness.  He plans to have another procedure with Dr. Janace Hoard. He has intermittent shortness of breath.  Jacob [name] is scheduled to resume systemic therapy today.  Objective:  Vital signs in last 24 hours:  Blood pressure 117/86, pulse 60, temperature 97.9 F (36.6 C), temperature source Oral, resp. rate 17, height _0  (1.778 m), weight 150 lb 9.6 oz (68.3 kg), SpO2 100 %.    Resp: Lungs clear bilaterally Cardio: Regular rate and rhythm GI: No hepatomegaly, nontender, left lower quadrant colostomy Vascular: No leg edema    Portacath/PICC-without erythema  Lab Results:  Lab Results  Component Value Date   WBC 7.2 08/19/2017   HGB 12.3 (L) 08/19/2017   HCT 37.5 (L) 08/19/2017   MCV 89.9 08/19/2017   PLT 71 (L) 08/19/2017   NEUTROABS 6.6 (H) 08/19/2017    CMP  Lab Results  Component Value Date   NA 132 (L) 08/19/2017   K 4.2 08/19/2017   CL 100 08/19/2017   CO2 26 08/19/2017   GLUCOSE 111 08/19/2017   BUN 28 (H) 08/19/2017   CREATININE 0.81 08/19/2017   CALCIUM 8.4 08/19/2017   PROT 6.0 (L) 08/19/2017   ALBUMIN 3.5 08/19/2017   AST 45 (H) 08/19/2017   ALT 89 (H) 08/19/2017   ALKPHOS 59 08/19/2017   BILITOT 0.4 08/19/2017   GFRNONAA >60 08/19/2017   GFRAA >60 08/19/2017    Lab Results  Component Value Date   CEA1 37.72 (H) 08/19/2017     Imaging:  No results found.  Medications: I have reviewed the patient's current medications.   Assessment/Plan: 1. Rectal cancer. Partially obstructing mass noted 1-2 cm from the anal verge on a colonoscopy 03/06/2012. Endoscopic ultrasound 03/14/2012 with a 7.5 mm thick, 3.2 cm wide hypoechoic,  irregularly bordered mass that clearly passed into and through the muscularis propria layer of the distal rectal wall (uT3); 3 small (largest 7 mm) perirectal lymph nodes. The lymph nodes were all round, discrete, hypoechoic, homogenous; suspicious for malignant involvement (uN1).  No RAS mutation identified by [facility] 1 testing on the colon resection specimen 06/29/2012, APC alteration identified, microsatellite stable  He began radiation and concurrent Xeloda chemotherapy on 03/20/2012, completed 04/27/2012.   Low anterior resection/coloanal anastomosis and diverting ileostomy 06/29/2012 with the final pathology revealing a T2N0 tumor with extensive fibrosis and negative margins.   Cycle 1 of adjuvant CAPOX 07/19/2012. Cycle 5 of adjuvant CAPOX 10/11/2012.   CEA 2.5 on 11/14/2012.   CEA 14.6 03/08/2013.   Restaging CT evaluation 03/08/2013 with a 4 mm pulmonary nodule left lung base not identified on comparison exam; new liver lesions including a 29 x 26 mm irregular peripheral enhancing rounded lesion in the dome of the right hepatic lobe, a less well-defined new subcapsular lesion in the lateral right hepatic lobe measuring 12 mm, a new subcapsular lesion in the anterior right hepatic lobe adjacent to the gallbladder fossa measuring 10 mm; a rounded low-density lesion in the inferior right hepatic lobe measuring 10 mm compared to 7 mm on the prior study (radiologist commented this may represent an enlarged cyst).   MRI of the abdomen 04/12/2013 confirmed multiple T2 hyperintense metastatic lesions throughout the  right liver   Initiation of FOLFIRI/Avastin with genotype based irinotecan dosing per the [facility] study 04/05/2013.   Restaging CT evaluation on 06/20/2013 (after 2 cycles/4 treatments) showed improvement in the liver metastases and stable size of a left lower lobe pulmonary nodule now with central cavitation.   Continuation of FOLFIRI/Avastin.   Restaging CT evaluation  08/14/2013-decrease in the size of liver metastases, slight decrease in the size of a cavitary left lower lobe nodule, no evidence of disease progression.   Status post right hepatic lobectomy 09/17/2013. Pathology showed multiple foci of metastatic adenocarcinoma (5 nodules of metastatic adenocarcinoma 4 of which are subcapsular with the nodules ranging in size from 0.6-1.8 cm in greatest dimension). Margins not involved. Biopsy of a portal lymph node showed benign adipose tissue; no lymph node tissue or malignancy.   Normal CEA 11/06/2013   CT 11/06/2013 with a new pleural-based right lower chest lesion, slight in enlargement of the a left-sided lung nodule  CT 01/29/2014 with a decrease in the right lower chest pleural-based lesion and a slight increase of a left-sided lung lesion, other lung lesions were stable  CT chest 05/02/2014 with a decrease in the size of a right lower lobe pulmonary nodule months similar size of a dominant left lower lobe nodule, minimal enlargement of a smaller left lower lobe nodule, no new site of disease  CT chest 11/07/2014 with a slight increase in several left-sided lung nodules  CT chest 02/10/2015 with new small left hilar lymph nodes, a possible new left lower lobe nodule, and stability of other lung nodules  PET scan 03/12/2015 with hypermetabolic left hilar nodes, hypermetabolic left lower lobe nodules, hyper metabolic retroperitoneal nodes, intense hypermetabolism at the coloanal anastomosis, and hypermetabolic thickening in the presacral space  Cycle 1 FOLFIRI/PANITUMUMAB 05/21/2015  Cycle 2 FOLFIRI/PANITUMUMAB 06/05/2015  Cycle 3 FOLFIRI/PANITUMUMAB 06/19/2015  Cycle 4 FOLFIRI 07/10/2015  Cycle 5 FOLFIRI 07/24/2015  Restaging CTs 08/06/2015-resolution of hilar/retroperitoneal adenopathy, improvement in the hypermetabolic lung nodule, other lung nodules are stable, no new lesions  Cycle 6 FOLFIRI with PANITUMUMAB 08/28/2015 -panitumumab dose  reduced  Cycle 7 FOLFIRI 09/11/2015-no panitumumab given  Cycle 8 FOLFIRI with PANITUMUMAB 09/25/2015-PANITUMUMAB dose reduced  Cycle 9 FOLFIRI 10/09/2015-no PANITUMUMAB given  Cycle 10 FOLFIRI with panitumumab 10/23/2015  CTs 11/06/2015-possible slight enlargement of a left lower lobe nodule, no other evidence of disease progression.  CTs 02/16/2016-enlargement of left-sided pulmonary nodules and mediastinal/left hilar nodes, improved splenomegaly  CTs 06/21/2016-increased size of mediastinal/left hilar nodes, increased left lung nodules, and increased soft tissue at the porta hepatis  CTs 10/28/2016-progressive disease in the chest and abdomen with an enlarging left hilar mass and slight interval enlargement of pulmonary lesions. Right upper quadrant necrotic nodal mass significantly increased in size with mass effect on the liver and possible invasion. Significant compression of the intrahepatic IVC. New right hepatic lobe lesion. Moderate pelvic ascites.  Cycle 1 FOLFIRI/PANITUMUMAB 11/04/2016  Cycle 2 FOLFIRI/PANITUMUMAB 11/18/2016  Cycle 3 FOLFIRI/panitumumab 12/02/2016  Cycle4 FOLFIRI/PANITUMUMAB 12/15/2016  Cycle 5 FOLFIRI/PANITUMUMAB 12/30/2016  CTs 01/06/2017-no evidence of progressive disease, decreased chest adenopathy, stable left lower lobe nodules, decreased liver metastasis, decreased right retroperitoneal mass  Cycle 6 FOLFIRI/panitumumab 01/13/2017  Cycle 7 FOLFIRI/Panitumumab 01/27/2017  Cycle 8 FOLFIRI/panitumumab 02/17/2017  Cycle 9 FOLFIRI/Panitumumab 03/03/2017  Cycle 10 FOLFIRI/Panitumumab 03/24/2017  CTs 04/06/2017-enlargement of right adrenal mass (smaller than October 2019), stable left hilar fullness and lung nodules  Cycle 11 FOLFIRI/Panitumumab 04/07/2017  Cycle 12 FOLFIRI/panitumumab 04/21/2017  Cycle 13 FOLFIRI/Panitumumab 05/05/2017  Palliative radiation  to the right adrenal mass 226 2019-3 02/2018  Brain MRI 07/28/2017- 4 intracranial  metastasis in the right frontal lobe, left parietal lobe and left occipital lobe.  Surrounding edema pronounced in the left occipital lobe.  SBRT to for brain lesions on 08/12/2017  CTs 08/10/2017- new left upper lobe airspace process, stable lung nodules and medius and lymphadenopathy, decreased mass involving the adrenal gland with progression of a necrotic anterior portion of the mass, no liver lesions  Cycle 14 FOLFIRI/panitumumab 08/19/2017, irinotecan dose reduced secondary to thrombocytopenia, Panitumumab dose escalated 2. History ofIrregular bowel habits/rectal bleeding secondary to #1. 3. History of Mild elevation of the liver enzymes. Question secondary to hepatic steatosis. 4. Indeterminate 8 mm posterior right liver lesion on the staging CT 03/06/2012. 5. Mildly elevated CEA at 5.7 on 03/06/2012. 6. History of radiation erythema at the groin and perineum 7. 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 No clinical evidence of thrombophlebitis or extravasation. 8. Delayed nausea following chemotherapy-Decadron prophylaxis was added with cycle 3 CAPOX-improved. 9. Ileostomy takedown 12/07/2012. 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 11. Port-A-Cath placement 12/31/204 12. Severe neutropenia secondary to chemotherapy following cycle 1 of FOLFIRI, chemotherapy was dose reduced and he received Neulasta with day 15 cycle 1  13. Nausea and vomiting following cycle 1 of FOLFIRI 14. Rectal stricture-manual/colonoscopic dilatation by Dr. Ardis Hughs 03/01/2016  APR 08/17/2016-no evidence of malignancy 15. History ofLeukopenia/Thrombocytopenia-persistent, potentially a sequelae of chemotherapy or hepatic toxicity from chemotherapy/radiation. Bone marrow biopsy 11/20/2014 showed cellular bone marrow with trilineage hematopoiesis. Significant dyspoiesis was not present and there was no evidence of metastatic  carcinoma. Cytogenetic analysis showed the presence of normal male chromosomes with no observable clonal chromosomal abnormalities.  probable cirrhosis 16. Genetic testing-negative genetic panel in March 2014 17. 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 Severe over the face-steroid Dosepak prescribed 07/03/2015. Improved 07/10/2015. Further improved 07/24/2015, 08/07/2015, 08/28/2015 18. Diarrhea secondary to chemotherapy-encouraged to use Imodium  19. History of Paronychia secondary to PANITUMUMAB 20. 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 Neutropenia, thrombocytopenia 06/23/2017. Possibly related to radiation. 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  He completed SBRT to for brain metastases last week.  His neurologic status has improved. I discussed the restaging CT findings with him.  The CEA is higher.  The plan is to resume systemic therapy with FOLFIRI/panitumumab.  The platelet count is lower again today, this may be related to recent radiation.  We decided to proceed with chemotherapy with a dose reduction of irinotecan.  He will contact us for bleeding or bruising.  The panitumumab dose will be increased to 4 mg/kg since he tolerated the 3 mg/kg dose without significant skin toxicity. Plan is to schedule a vocal cord procedure with Dr. Janace Hoard.  His ECOG performance status is measured at 1 today.  25 minutes were spent with the patient today.  The majority of the time was used for counseling and coordination of care. Betsy Coder, MD  08/19/2017  2:46 PM

## 2017-08-19 NOTE — Progress Notes (Signed)
Per Dr. Gearldine Shown request, provided patient with detailed instructions on how to turn pump off at the end of infusion. Patient verbalized understanding.

## 2017-08-19 NOTE — Progress Notes (Signed)
Patient will turn pump off on 08/21/17 when the drug runs out - then have pump dc on 08/22/17.  Maintain 3.10mL/hr rate over 46 hours. Confirmed Vectibix dose increase with MD. Continue 5FU pump at the same dose. Decrease Irinotecan dose due to pltc.  B.Jaikob Borgwardt, PharmD, BCPS, BCOP

## 2017-08-19 NOTE — Telephone Encounter (Signed)
Scheduled appt per 5/31 los - gave patient AVS and calender per los.

## 2017-08-19 NOTE — Patient Instructions (Signed)
[facility] Discharge Instructions for Patients Receiving Chemotherapy  Today you received the following chemotherapy agents:  Vectibix, Irinotecan, Leucovorin, and 5FU.  To help prevent nausea and vomiting after your treatment, we encourage you to take your nausea medication as directed.   If you develop nausea and vomiting that is not controlled by your nausea medication, call the clinic.   BELOW ARE SYMPTOMS THAT SHOULD BE REPORTED IMMEDIATELY:  *FEVER GREATER THAN 100.5 F  *CHILLS WITH OR WITHOUT FEVER  NAUSEA AND VOMITING THAT IS NOT CONTROLLED WITH YOUR NAUSEA MEDICATION  *UNUSUAL SHORTNESS OF BREATH  *UNUSUAL BRUISING OR BLEEDING  TENDERNESS IN MOUTH AND THROAT WITH OR WITHOUT PRESENCE OF ULCERS  *URINARY PROBLEMS  *BOWEL PROBLEMS  UNUSUAL RASH Items with * indicate a potential emergency and should be followed up as soon as possible.  Feel free to call the clinic should you have any questions or concerns. The clinic phone number is [phone].  Please show the [place] at check-in to the Emergency Department and triage nurse.

## 2017-08-22 ENCOUNTER — Inpatient Hospital Stay: Payer: BC Managed Care – PPO | Attending: Oncology

## 2017-08-22 DIAGNOSIS — R252 Cramp and spasm: Secondary | ICD-10-CM | POA: Diagnosis not present

## 2017-08-22 DIAGNOSIS — B370 Candidal stomatitis: Secondary | ICD-10-CM | POA: Insufficient documentation

## 2017-08-22 DIAGNOSIS — C787 Secondary malignant neoplasm of liver and intrahepatic bile duct: Secondary | ICD-10-CM | POA: Diagnosis not present

## 2017-08-22 DIAGNOSIS — L270 Generalized skin eruption due to drugs and medicaments taken internally: Secondary | ICD-10-CM | POA: Diagnosis not present

## 2017-08-22 DIAGNOSIS — C20 Malignant neoplasm of rectum: Secondary | ICD-10-CM | POA: Insufficient documentation

## 2017-08-22 DIAGNOSIS — E279 Disorder of adrenal gland, unspecified: Secondary | ICD-10-CM | POA: Insufficient documentation

## 2017-08-22 DIAGNOSIS — R911 Solitary pulmonary nodule: Secondary | ICD-10-CM | POA: Insufficient documentation

## 2017-08-22 DIAGNOSIS — Z5112 Encounter for antineoplastic immunotherapy: Secondary | ICD-10-CM | POA: Insufficient documentation

## 2017-08-22 DIAGNOSIS — Z5111 Encounter for antineoplastic chemotherapy: Secondary | ICD-10-CM | POA: Diagnosis present

## 2017-08-22 MED ORDER — SODIUM CHLORIDE 0.9 % IJ SOLN
10.0000 mL | INTRAMUSCULAR | Status: DC | PRN
  Administered 2017-08-22: 10 mL
  Filled 2017-08-22: qty 10

## 2017-08-22 MED ORDER — PEGFILGRASTIM-CBQV 6 MG/0.6ML ~~LOC~~ SOSY
6.0000 mg | PREFILLED_SYRINGE | Freq: Once | SUBCUTANEOUS | Status: AC
  Administered 2017-08-22: 6 mg via SUBCUTANEOUS

## 2017-08-22 MED ORDER — PEGFILGRASTIM-CBQV 6 MG/0.6ML ~~LOC~~ SOSY
PREFILLED_SYRINGE | SUBCUTANEOUS | Status: AC
  Filled 2017-08-22: qty 0.6

## 2017-08-22 MED ORDER — HEPARIN SOD (PORK) LOCK FLUSH 100 UNIT/ML IV SOLN
500.0000 [IU] | Freq: Once | INTRAVENOUS | Status: AC | PRN
  Administered 2017-08-22: 500 [IU]
  Filled 2017-08-22: qty 5

## 2017-08-26 ENCOUNTER — Telehealth: Payer: Self-pay | Admitting: Emergency Medicine

## 2017-08-26 NOTE — Telephone Encounter (Signed)
Pt called c/o of intense cramping in his upper and lower extremities. C/o of small rash around ankles that looks like "sprinkles". MD made aware. MD is concerned for low platelet count. Offered pt to come in to have labs drawn or to see [facility]. Pt declined appts at this time. Encouraged him to drink plenty of fluids and to take pain medication as prescribed. Pt knows to call us on Monday morning and let us know how he is doing and let us know if he needs to be seen that day. Pt also knows to call on call this weekend if symptoms worsen.

## 2017-08-28 ENCOUNTER — Other Ambulatory Visit: Payer: Self-pay | Admitting: Oncology

## 2017-08-29 ENCOUNTER — Telehealth: Payer: Self-pay | Admitting: Emergency Medicine

## 2017-08-29 NOTE — Telephone Encounter (Signed)
Called pt to check on him. ( check previous note from 6-7) pt states he is doing much better. C/o of mild cramping in lower extremity's but none in upper. States rash looks better on legs as well. Pt will be here 6/13 for appt. Pt knows to call sooner for any issues.

## 2017-09-01 ENCOUNTER — Inpatient Hospital Stay: Payer: BC Managed Care – PPO

## 2017-09-01 ENCOUNTER — Inpatient Hospital Stay (HOSPITAL_BASED_OUTPATIENT_CLINIC_OR_DEPARTMENT_OTHER): Payer: BC Managed Care – PPO | Admitting: Oncology

## 2017-09-01 VITALS — BP 109/77 | HR 62 | Temp 97.9°F | Resp 17 | Ht 70.0 in | Wt 137.9 lb

## 2017-09-01 DIAGNOSIS — R252 Cramp and spasm: Secondary | ICD-10-CM

## 2017-09-01 DIAGNOSIS — C787 Secondary malignant neoplasm of liver and intrahepatic bile duct: Principal | ICD-10-CM

## 2017-09-01 DIAGNOSIS — E279 Disorder of adrenal gland, unspecified: Secondary | ICD-10-CM | POA: Diagnosis not present

## 2017-09-01 DIAGNOSIS — C20 Malignant neoplasm of rectum: Secondary | ICD-10-CM

## 2017-09-01 DIAGNOSIS — R911 Solitary pulmonary nodule: Secondary | ICD-10-CM | POA: Diagnosis not present

## 2017-09-01 DIAGNOSIS — L270 Generalized skin eruption due to drugs and medicaments taken internally: Secondary | ICD-10-CM | POA: Diagnosis not present

## 2017-09-01 DIAGNOSIS — B370 Candidal stomatitis: Secondary | ICD-10-CM

## 2017-09-01 LAB — CBC WITH DIFFERENTIAL (CANCER CENTER ONLY)
Basophils Absolute: 0 10*3/uL (ref 0.0–0.1)
Basophils Relative: 0 %
Eosinophils Absolute: 0 10*3/uL (ref 0.0–0.5)
Eosinophils Relative: 0 %
HEMATOCRIT: 33 % — AB (ref 38.4–49.9)
Hemoglobin: 11.1 g/dL — ABNORMAL LOW (ref 13.0–17.1)
LYMPHS ABS: 0.5 10*3/uL — AB (ref 0.9–3.3)
LYMPHS PCT: 8 %
MCH: 29.9 pg (ref 27.2–33.4)
MCHC: 33.6 g/dL (ref 32.0–36.0)
MCV: 88.9 fL (ref 79.3–98.0)
MONO ABS: 0.4 10*3/uL (ref 0.1–0.9)
Monocytes Relative: 7 %
Neutro Abs: 5.2 10*3/uL (ref 1.5–6.5)
Neutrophils Relative %: 85 %
Platelet Count: 83 10*3/uL — ABNORMAL LOW (ref 140–400)
RBC: 3.72 MIL/uL — AB (ref 4.20–5.82)
RDW: 18.4 % — ABNORMAL HIGH (ref 11.0–14.6)
WBC: 6.1 10*3/uL (ref 4.0–10.3)

## 2017-09-01 LAB — CMP (CANCER CENTER ONLY)
ALBUMIN: 3 g/dL — AB (ref 3.5–5.0)
ALT: 59 U/L — ABNORMAL HIGH (ref 0–55)
AST: 56 U/L — AB (ref 5–34)
Alkaline Phosphatase: 75 U/L (ref 40–150)
Anion gap: 7 (ref 3–11)
BUN: 18 mg/dL (ref 7–26)
CHLORIDE: 104 mmol/L (ref 98–109)
CO2: 24 mmol/L (ref 22–29)
Calcium: 8.7 mg/dL (ref 8.4–10.4)
Creatinine: 0.66 mg/dL — ABNORMAL LOW (ref 0.70–1.30)
GFR, Est AFR Am: >60 mL/min (ref >60)
GFR, Estimated: >60 mL/min (ref >60)
GLUCOSE: 103 mg/dL (ref 70–140)
POTASSIUM: 3.6 mmol/L (ref 3.5–5.1)
Sodium: 135 mmol/L — ABNORMAL LOW (ref 136–145)
Total Bilirubin: 0.3 mg/dL (ref 0.2–1.2)
Total Protein: 5.7 g/dL — ABNORMAL LOW (ref 6.4–8.3)

## 2017-09-01 LAB — MAGNESIUM: MAGNESIUM: 1.9 mg/dL (ref 1.7–2.4)

## 2017-09-01 MED ORDER — PALONOSETRON HCL INJECTION 0.25 MG/5ML
INTRAVENOUS | Status: AC
Start: 2017-09-01 — End: ?
  Filled 2017-09-01: qty 5

## 2017-09-01 MED ORDER — SODIUM CHLORIDE 0.9 % IV SOLN
Freq: Once | INTRAVENOUS | Status: AC
  Administered 2017-09-01: 11:00:00 via INTRAVENOUS

## 2017-09-01 MED ORDER — FLUOROURACIL CHEMO INJECTION 5 GM/100ML
2400.0000 mg/m2 | INTRAVENOUS | Status: DC
  Administered 2017-09-01: 4400 mg via INTRAVENOUS
  Filled 2017-09-01: qty 88

## 2017-09-01 MED ORDER — OXYCODONE HCL 5 MG PO TABS: 5.0000 mg | ORAL_TABLET | Freq: Four times a day (QID) | ORAL | 0 refills | Status: DC | PRN

## 2017-09-01 MED ORDER — SODIUM CHLORIDE 0.9 % IV SOLN
2.0000 mg/kg | Freq: Once | INTRAVENOUS | Status: AC
  Administered 2017-09-01: 120 mg via INTRAVENOUS
  Filled 2017-09-01: qty 6

## 2017-09-01 MED ORDER — LEUCOVORIN CALCIUM INJECTION 350 MG
400.0000 mg/m2 | Freq: Once | INTRAVENOUS | Status: AC
  Administered 2017-09-01: 736 mg via INTRAVENOUS
  Filled 2017-09-01: qty 36.8

## 2017-09-01 MED ORDER — DEXTROSE 5 % IV SOLN
100.0000 mg/m2 | Freq: Once | INTRAVENOUS | Status: AC
  Administered 2017-09-01: 180 mg via INTRAVENOUS
  Filled 2017-09-01: qty 9

## 2017-09-01 MED ORDER — ALPRAZOLAM 0.25 MG PO TABS: 0.2500 mg | ORAL_TABLET | Freq: Every evening | ORAL | 1 refills | Status: DC | PRN

## 2017-09-01 MED ORDER — LEVETIRACETAM 500 MG PO TABS: 500.0000 mg | ORAL_TABLET | Freq: Two times a day (BID) | ORAL | 0 refills | Status: DC

## 2017-09-01 MED ORDER — MAGIC MOUTHWASH: 5.0000 mL | ORAL | 0 refills | Status: DC | PRN

## 2017-09-01 MED ORDER — SODIUM CHLORIDE 0.9 % IV SOLN
Freq: Once | INTRAVENOUS | Status: AC
  Administered 2017-09-01: 12:00:00 via INTRAVENOUS
  Filled 2017-09-01: qty 5

## 2017-09-01 MED ORDER — FLUCONAZOLE 100 MG PO TABS: 100.0000 mg | ORAL_TABLET | Freq: Every day | ORAL | 0 refills | Status: DC

## 2017-09-01 MED ORDER — PALONOSETRON HCL INJECTION 0.25 MG/5ML
0.2500 mg | Freq: Once | INTRAVENOUS | Status: AC
  Administered 2017-09-01: 0.25 mg via INTRAVENOUS

## 2017-09-01 NOTE — Progress Notes (Signed)
Ok to treat with labs from today per Dr Benay Spice.

## 2017-09-01 NOTE — Progress Notes (Signed)
[facility] OFFICE PROGRESS NOTE   Diagnosis: Rectal cancer  INTERVAL HISTORY:   Jacob [name] pleaded a cycle of FOLFIRI/panitumumab beginning 08/19/2017.  No nausea/vomiting or diarrhea.  He has noted small sores over the tongue.  These wax and wane.  He has developed a "thrush ".  He continues to have hoarseness.  He wonders whether the pneumonia last month was related to aspirating. He has an erythematous rash over the chest and face. His chief complaint is "cramps "causing severe pain in the upper and lower extremities beginning a few days after chemotherapy and lasting until a few days ago.  The cramps limited his activity.  Objective:  Vital signs in last 24 hours:  Blood pressure 109/77, pulse 62, temperature 97.9 F (36.6 C), temperature source Oral, resp. rate 17, height '5\' 10"'  (1.778 m), weight 137 lb 14.4 oz (62.6 kg), SpO2 100 %.    HEENT: Thrush at the pharynx, few 1-2 mm ulcers over the tongue Resp: Bilateral end expiratory mild wheeze, no respiratory distress Cardio: Regular rate and rhythm GI: No hepatomegaly, nontender, left lower quadrant colostomy Vascular: No leg edema  Skin: Acne type rash over the chest greater than back, erythema of the face and scalp, palms without erythema  Portacath/PICC-without erythema  Lab Results:  Lab Results  Component Value Date   WBC 6.1 09/01/2017   HGB 11.1 (L) 09/01/2017   HCT 33.0 (L) 09/01/2017   MCV 88.9 09/01/2017   PLT 83 (L) 09/01/2017   NEUTROABS 5.2 09/01/2017    CMP  Lab Results  Component Value Date   NA 135 (L) 09/01/2017   K 3.6 09/01/2017   CL 104 09/01/2017   CO2 24 09/01/2017   GLUCOSE 103 09/01/2017   BUN 18 09/01/2017   CREATININE 0.66 (L) 09/01/2017   CALCIUM 8.7 09/01/2017   PROT 5.7 (L) 09/01/2017   ALBUMIN 3.0 (L) 09/01/2017   AST 56 (H) 09/01/2017   ALT 59 (H) 09/01/2017   ALKPHOS 75 09/01/2017   BILITOT 0.3 09/01/2017   GFRNONAA >60 09/01/2017   GFRAA >60 09/01/2017     Lab Results  Component Value Date   CEA1 37.72 (H) 08/19/2017     Medications: I have reviewed the patient's current medications.   Assessment/Plan: 1. Rectal cancer. Partially obstructing mass noted 1-2 cm from the anal verge on a colonoscopy 03/06/2012. Endoscopic ultrasound 03/14/2012 with a 7.5 mm thick, 3.2 cm wide hypoechoic, irregularly bordered mass that clearly passed into and through the muscularis propria layer of the distal rectal wall (uT3); 3 small (largest 7 mm) perirectal lymph nodes. The lymph nodes were all round, discrete, hypoechoic, homogenous; suspicious for malignant involvement (uN1).  No RAS mutation identified by [facility] 1 testing on the colon resection specimen 06/29/2012, APC alteration identified, microsatellite stable  He began radiation and concurrent Xeloda chemotherapy on 03/20/2012, completed 04/27/2012.   Low anterior resection/coloanal anastomosis and diverting ileostomy 06/29/2012 with the final pathology revealing a T2N0 tumor with extensive fibrosis and negative margins.   Cycle 1 of adjuvant CAPOX 07/19/2012. Cycle 5 of adjuvant CAPOX 10/11/2012.   CEA 2.5 on 11/14/2012.   CEA 14.6 03/08/2013.   Restaging CT evaluation 03/08/2013 with a 4 mm pulmonary nodule left lung base not identified on comparison exam; new liver lesions including a 29 x 26 mm irregular peripheral enhancing rounded lesion in the dome of the right hepatic lobe, a less well-defined new subcapsular lesion in the lateral right hepatic lobe measuring 12 mm, a new subcapsular  lesion in the anterior right hepatic lobe adjacent to the gallbladder fossa measuring 10 mm; a rounded low-density lesion in the inferior right hepatic lobe measuring 10 mm compared to 7 mm on the prior study (radiologist commented this may represent an enlarged cyst).   MRI of the abdomen 04/12/2013 confirmed multiple T2 hyperintense metastatic lesions throughout the right liver   Initiation of  FOLFIRI/Avastin with genotype based irinotecan dosing per the [facility] study 04/05/2013.   Restaging CT evaluation on 06/20/2013 (after 2 cycles/4 treatments) showed improvement in the liver metastases and stable size of a left lower lobe pulmonary nodule now with central cavitation.   Continuation of FOLFIRI/Avastin.   Restaging CT evaluation 08/14/2013-decrease in the size of liver metastases, slight decrease in the size of a cavitary left lower lobe nodule, no evidence of disease progression.   Status post right hepatic lobectomy 09/17/2013. Pathology showed multiple foci of metastatic adenocarcinoma (5 nodules of metastatic adenocarcinoma 4 of which are subcapsular with the nodules ranging in size from 0.6-1.8 cm in greatest dimension). Margins not involved. Biopsy of a portal lymph node showed benign adipose tissue; no lymph node tissue or malignancy.   Normal CEA 11/06/2013   CT 11/06/2013 with a new pleural-based right lower chest lesion, slight in enlargement of the a left-sided lung nodule  CT 01/29/2014 with a decrease in the right lower chest pleural-based lesion and a slight increase of a left-sided lung lesion, other lung lesions were stable  CT chest 05/02/2014 with a decrease in the size of a right lower lobe pulmonary nodule months similar size of a dominant left lower lobe nodule, minimal enlargement of a smaller left lower lobe nodule, no new site of disease  CT chest 11/07/2014 with a slight increase in several left-sided lung nodules  CT chest 02/10/2015 with new small left hilar lymph nodes, a possible new left lower lobe nodule, and stability of other lung nodules  PET scan 03/12/2015 with hypermetabolic left hilar nodes, hypermetabolic left lower lobe nodules, hyper metabolic retroperitoneal nodes, intense hypermetabolism at the coloanal anastomosis, and hypermetabolic thickening in the presacral space  Cycle 1 FOLFIRI/PANITUMUMAB 05/21/2015  Cycle 2  FOLFIRI/PANITUMUMAB 06/05/2015  Cycle 3 FOLFIRI/PANITUMUMAB 06/19/2015  Cycle 4 FOLFIRI 07/10/2015  Cycle 5 FOLFIRI 07/24/2015  Restaging CTs 08/06/2015-resolution of hilar/retroperitoneal adenopathy, improvement in the hypermetabolic lung nodule, other lung nodules are stable, no new lesions  Cycle 6 FOLFIRI with PANITUMUMAB 08/28/2015 -panitumumab dose reduced  Cycle 7 FOLFIRI 09/11/2015-no panitumumab given  Cycle 8 FOLFIRI with PANITUMUMAB 09/25/2015-PANITUMUMAB dose reduced  Cycle 9 FOLFIRI 10/09/2015-no PANITUMUMAB given  Cycle 10 FOLFIRI with panitumumab 10/23/2015  CTs 11/06/2015-possible slight enlargement of a left lower lobe nodule, no other evidence of disease progression.  CTs 02/16/2016-enlargement of left-sided pulmonary nodules and mediastinal/left hilar nodes, improved splenomegaly  CTs 06/21/2016-increased size of mediastinal/left hilar nodes, increased left lung nodules, and increased soft tissue at the porta hepatis  CTs 10/28/2016-progressive disease in the chest and abdomen with an enlarging left hilar mass and slight interval enlargement of pulmonary lesions. Right upper quadrant necrotic nodal mass significantly increased in size with mass effect on the liver and possible invasion. Significant compression of the intrahepatic IVC. New right hepatic lobe lesion. Moderate pelvic ascites.  Cycle 1 FOLFIRI/PANITUMUMAB 11/04/2016  Cycle 2 FOLFIRI/PANITUMUMAB 11/18/2016  Cycle 3 FOLFIRI/panitumumab 12/02/2016  Cycle4 FOLFIRI/PANITUMUMAB 12/15/2016  Cycle 5 FOLFIRI/PANITUMUMAB 12/30/2016  CTs 01/06/2017-no evidence of progressive disease, decreased chest adenopathy, stable left lower lobe nodules, decreased liver metastasis, decreased right retroperitoneal mass  Cycle  6 FOLFIRI/panitumumab 01/13/2017  Cycle 7 FOLFIRI/Panitumumab 01/27/2017  Cycle 8 FOLFIRI/panitumumab 02/17/2017  Cycle 9 FOLFIRI/Panitumumab 03/03/2017  Cycle 10 FOLFIRI/Panitumumab  03/24/2017  CTs 04/06/2017-enlargement of right adrenal mass (smaller than October 2019), stable left hilar fullness and lung nodules  Cycle 11 FOLFIRI/Panitumumab 04/07/2017  Cycle 12 FOLFIRI/panitumumab 04/21/2017  Cycle 13 FOLFIRI/Panitumumab 05/05/2017  Palliative radiation to the right adrenal mass 226 2019-3 02/2018  Brain MRI 07/28/2017-4intracranial metastasis in the right frontal lobe, left parietal lobe and left occipital lobe. Surrounding edema pronounced in the left occipital lobe.  SBRT to for brain lesions on 08/12/2017  CTs 08/10/2017- new left upper lobe airspace process, stable lung nodules and medius and lymphadenopathy, decreased mass involving the adrenal gland with progression of a necrotic anterior portion of the mass, no liver lesions  Cycle 14 FOLFIRI/panitumumab 08/19/2017, irinotecan dose reduced secondary to thrombocytopenia, Panitumumab dose escalated  Cycle 15 FOLFIRI/panitumumab 09/01/2017) Panitumumab dose reduced secondary to an early rash following cycle 14 2. History ofIrregular bowel habits/rectal bleeding secondary to #1. 3. History of Mild elevation of the liver enzymes. Question secondary to hepatic steatosis. 4. Indeterminate 8 mm posterior right liver lesion on the staging CT 03/06/2012. 5. Mildly elevated CEA at 5.7 on 03/06/2012. 6. History of radiation erythema at the groin and perineum 7. 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 No clinical evidence of thrombophlebitis or extravasation. 8. Delayed nausea following chemotherapy-Decadron prophylaxis was added with cycle 3 CAPOX-improved. 9. Ileostomy takedown 12/07/2012. 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 11. Port-A-Cath placement 12/31/204 12. Severe neutropenia secondary to chemotherapy following cycle 1 of FOLFIRI in 2015, chemotherapy was dose reduced and he received Neulasta with day 15 cycle 1   13. Nausea and vomiting following cycle 1 of FOLFIRI 14. Rectal stricture-manual/colonoscopic dilatation by Dr. Ardis Hughs 03/01/2016  APR 08/17/2016-no evidence of malignancy 15. History ofLeukopenia/Thrombocytopenia-persistent, potentially a sequelae of chemotherapy or hepatic toxicity from chemotherapy/radiation. Bone marrow biopsy 11/20/2014 showed cellular bone marrow with trilineage hematopoiesis. Significant dyspoiesis was not present and there was no evidence of metastatic carcinoma. Cytogenetic analysis showed the presence of normal male chromosomes with no observable clonal chromosomal abnormalities.  probable cirrhosis 16. Genetic testing-negative genetic panel in March 2014 17. 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 Severe over the face-steroid Dosepak prescribed 07/03/2015. Improved 07/10/2015. Further improved 07/24/2015, 08/07/2015, 08/28/2015 18. Diarrhea secondary to chemotherapy-encouraged to use Imodium  19. History of Paronychia secondary to PANITUMUMAB 20. 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 History of neutropenia, thrombocytopenia 06/23/2017. Possibly related to radiation. He also has a history of suspected portal hypertension secondary to toxicity from chemotherap 22. Seizure in the setting of brain metastases 08/05/2017, now maintained on Keppra   Disposition: Jacob [name] completed 1 cycle of FOLFIRI/panitumumab given as salvage systemic therapy.  He tolerated therapy well, though he developed severe cramps in the upper and lower extremities over the week following chemotherapy.  The etiology of the cramps is unclear.  He reports adequate fluid intake and the potassium/magnesium levels are normal today.  No significant diarrhea.  The cramps may have been related to G-CSF support.  He will not receive G-CSF with this cycle.  He knows to contact us for a  fever.  He has developed a significant panitumumab skin rash.  He has a history of a severe rash with Panitumumab in the past.  I will dose reduce the panitumumab with this cycle.  Jacob [name] would like to proceed with the vocal cord stabilization procedure with Dr. Janace Hoard.  He has oral candidiasis today.  He will be prescribed a course of Diflucan.  25 minutes were spent with the patient today.  The majority of the time was used for counseling and coordination of care.  Betsy Coder, MD  09/01/2017  12:19 PM

## 2017-09-01 NOTE — Patient Instructions (Signed)
[facility] Discharge Instructions for Patients Receiving Chemotherapy  Today you received the following chemotherapy agents:  Vectibix, Irinotecan, Leucovorin, and 5FU.  To help prevent nausea and vomiting after your treatment, we encourage you to take your nausea medication as directed.   If you develop nausea and vomiting that is not controlled by your nausea medication, call the clinic.   BELOW ARE SYMPTOMS THAT SHOULD BE REPORTED IMMEDIATELY:  *FEVER GREATER THAN 100.5 F  *CHILLS WITH OR WITHOUT FEVER  NAUSEA AND VOMITING THAT IS NOT CONTROLLED WITH YOUR NAUSEA MEDICATION  *UNUSUAL SHORTNESS OF BREATH  *UNUSUAL BRUISING OR BLEEDING  TENDERNESS IN MOUTH AND THROAT WITH OR WITHOUT PRESENCE OF ULCERS  *URINARY PROBLEMS  *BOWEL PROBLEMS  UNUSUAL RASH Items with * indicate a potential emergency and should be followed up as soon as possible.  Feel free to call the clinic should you have any questions or concerns. The clinic phone number is [phone].  Please show the [place] at check-in to the Emergency Department and triage nurse.

## 2017-09-01 NOTE — Addendum Note (Signed)
Addended by: Jethro Bolus A on: 09/01/2017 01:31 PM   Modules accepted: Orders

## 2017-09-02 ENCOUNTER — Other Ambulatory Visit: Payer: Self-pay | Admitting: Oncology

## 2017-09-02 ENCOUNTER — Telehealth: Payer: Self-pay | Admitting: Oncology

## 2017-09-02 DIAGNOSIS — C787 Secondary malignant neoplasm of liver and intrahepatic bile duct: Principal | ICD-10-CM

## 2017-09-02 DIAGNOSIS — C20 Malignant neoplasm of rectum: Secondary | ICD-10-CM

## 2017-09-02 NOTE — Telephone Encounter (Signed)
Scheduled appt per 6/13 los - pt to get an updated schedule next treatment

## 2017-09-03 ENCOUNTER — Inpatient Hospital Stay: Payer: BC Managed Care – PPO

## 2017-09-03 VITALS — BP 109/78 | HR 88 | Temp 98.5°F | Resp 17

## 2017-09-03 DIAGNOSIS — C787 Secondary malignant neoplasm of liver and intrahepatic bile duct: Principal | ICD-10-CM

## 2017-09-03 DIAGNOSIS — C20 Malignant neoplasm of rectum: Secondary | ICD-10-CM

## 2017-09-03 MED ORDER — HEPARIN SOD (PORK) LOCK FLUSH 100 UNIT/ML IV SOLN
500.0000 [IU] | Freq: Once | INTRAVENOUS | Status: AC | PRN
  Administered 2017-09-03: 500 [IU]
  Filled 2017-09-03: qty 5

## 2017-09-03 MED ORDER — SODIUM CHLORIDE 0.9 % IJ SOLN
10.0000 mL | INTRAMUSCULAR | Status: DC | PRN
  Administered 2017-09-03: 10 mL
  Filled 2017-09-03: qty 10

## 2017-09-03 NOTE — Progress Notes (Signed)
With assessment - patient reports continued intermittent pains down both arms and both lower legs "severe cramping". He has discussed this with his MD but wanted to make [place] aware it is improving but still occurring. He has normal electrolytes but is being cognicent to eat a variety of foods to keep those levels normal. HE also seems to have intermittent acid reflux - intermittent pain over diaphragm that is worse after lying down. NON radiating. Discussed meds and dietary things to try but also discussed criteria for seeking emergency medical care.

## 2017-09-05 ENCOUNTER — Encounter: Payer: Self-pay | Admitting: Oncology

## 2017-09-05 ENCOUNTER — Telehealth: Payer: Self-pay

## 2017-09-05 NOTE — Telephone Encounter (Signed)
Returned call to pt regarding surgical clearance. Pt states that Dr. Janace Hoard office needs a clearance from Dr. Benay Spice in order to perform surgery. This RN called Dr. Janace Hoard office to request that form be faxed. Per Tiffany, Dr. Peggyann Juba assistant, statement on [facility] letterhead to be faxed from Dr. Benay Spice. This RN voiced understanding and made pt aware. Pt voiced understanding. Will consult with MD

## 2017-09-08 ENCOUNTER — Other Ambulatory Visit: Payer: Self-pay | Admitting: Otolaryngology

## 2017-09-11 ENCOUNTER — Other Ambulatory Visit: Payer: Self-pay | Admitting: Oncology

## 2017-09-12 ENCOUNTER — Telehealth: Payer: Self-pay | Admitting: *Deleted

## 2017-09-12 NOTE — Telephone Encounter (Signed)
Received call from Marlane Hatcher RN/Case manager/[employer] asking about pt's prognosis & if < 6 months .  She states they are trying to find more benefits for him.  She asked if he would qualify for palliative care.  Call her @ [phone].  Message routed to Dr Sherrill/Pod RN

## 2017-09-13 NOTE — Telephone Encounter (Signed)
Copied from [place] [id number]. Topic: Inquiry >> Sep 13, 2017  9:51 AM Pricilla Handler wrote: Reason for CRM: Kennyth Lose, Nurse Case Manager with [facility] called regarding this patient. Kennyth Lose states that she needs to speak with one of our nurses here in the office. Please call Kennyth Lose with [employer] at [phone].       Thank You!!!    Gordan Payment back no answer left message for her to call me back.

## 2017-09-13 NOTE — Telephone Encounter (Signed)
See notes below, [employer] Case Manager will need a return call from a nurse in the office concerning this patient's prognosis and to see if he qualifies for palliative care.

## 2017-09-15 ENCOUNTER — Inpatient Hospital Stay: Payer: BC Managed Care – PPO

## 2017-09-15 ENCOUNTER — Telehealth: Payer: Self-pay | Admitting: Nurse Practitioner

## 2017-09-15 ENCOUNTER — Inpatient Hospital Stay: Payer: BC Managed Care – PPO | Admitting: Nurse Practitioner

## 2017-09-15 ENCOUNTER — Encounter: Payer: Self-pay | Admitting: Nurse Practitioner

## 2017-09-15 VITALS — BP 106/81 | HR 75 | Temp 98.6°F | Resp 17 | Ht 70.0 in | Wt 133.8 lb

## 2017-09-15 DIAGNOSIS — R630 Anorexia: Secondary | ICD-10-CM

## 2017-09-15 DIAGNOSIS — R911 Solitary pulmonary nodule: Secondary | ICD-10-CM | POA: Diagnosis not present

## 2017-09-15 DIAGNOSIS — E279 Disorder of adrenal gland, unspecified: Secondary | ICD-10-CM | POA: Diagnosis not present

## 2017-09-15 DIAGNOSIS — C20 Malignant neoplasm of rectum: Secondary | ICD-10-CM | POA: Diagnosis not present

## 2017-09-15 DIAGNOSIS — R21 Rash and other nonspecific skin eruption: Secondary | ICD-10-CM | POA: Diagnosis not present

## 2017-09-15 DIAGNOSIS — C787 Secondary malignant neoplasm of liver and intrahepatic bile duct: Secondary | ICD-10-CM

## 2017-09-15 DIAGNOSIS — Z95828 Presence of other vascular implants and grafts: Secondary | ICD-10-CM

## 2017-09-15 LAB — CBC WITH DIFFERENTIAL (CANCER CENTER ONLY)
BASOS ABS: 0 10*3/uL (ref 0.0–0.1)
Basophils Relative: 0 %
Eosinophils Absolute: 0 10*3/uL (ref 0.0–0.5)
Eosinophils Relative: 1 %
HEMATOCRIT: 26.5 % — AB (ref 38.4–49.9)
HEMOGLOBIN: 9.1 g/dL — AB (ref 13.0–17.1)
LYMPHS PCT: 11 %
Lymphs Abs: 0.3 10*3/uL — ABNORMAL LOW (ref 0.9–3.3)
MCH: 29.3 pg (ref 27.2–33.4)
MCHC: 34.1 g/dL (ref 32.0–36.0)
MCV: 85.8 fL (ref 79.3–98.0)
MONO ABS: 0.6 10*3/uL (ref 0.1–0.9)
Monocytes Relative: 19 %
NEUTROS ABS: 2 10*3/uL (ref 1.5–6.5)
NEUTROS PCT: 69 %
Platelet Count: 180 10*3/uL (ref 140–400)
RBC: 3.09 MIL/uL — AB (ref 4.20–5.82)
RDW: 18.6 % — ABNORMAL HIGH (ref 11.0–14.6)
WBC Count: 2.9 10*3/uL — ABNORMAL LOW (ref 4.0–10.3)

## 2017-09-15 LAB — MAGNESIUM: Magnesium: 2 mg/dL (ref 1.7–2.4)

## 2017-09-15 LAB — CMP (CANCER CENTER ONLY)
ALBUMIN: 2.5 g/dL — AB (ref 3.5–5.0)
ALT: 71 U/L — ABNORMAL HIGH (ref 0–44)
ANION GAP: 7 (ref 5–15)
AST: 60 U/L — ABNORMAL HIGH (ref 15–41)
Alkaline Phosphatase: 92 U/L (ref 38–126)
BUN: 9 mg/dL (ref 6–20)
CO2: 26 mmol/L (ref 22–32)
Calcium: 8.5 mg/dL — ABNORMAL LOW (ref 8.9–10.3)
Chloride: 97 mmol/L — ABNORMAL LOW (ref 98–111)
Creatinine: 0.77 mg/dL (ref 0.61–1.24)
GFR, Estimated: >60 mL/min (ref >60)
GLUCOSE: 105 mg/dL — AB (ref 70–99)
POTASSIUM: 3.2 mmol/L — AB (ref 3.5–5.1)
SODIUM: 130 mmol/L — AB (ref 135–145)
TOTAL PROTEIN: 6 g/dL — AB (ref 6.5–8.1)
Total Bilirubin: 0.4 mg/dL (ref 0.3–1.2)

## 2017-09-15 LAB — CEA (IN HOUSE-CHCC): CEA (CHCC-IN HOUSE): 20.68 ng/mL — AB (ref 0.00–5.00)

## 2017-09-15 MED ORDER — MAGIC MOUTHWASH: 5.0000 mL | Freq: Three times a day (TID) | ORAL | 0 refills | Status: DC | PRN

## 2017-09-15 MED ORDER — HEPARIN SOD (PORK) LOCK FLUSH 100 UNIT/ML IV SOLN
500.0000 [IU] | Freq: Once | INTRAVENOUS | Status: AC | PRN
  Administered 2017-09-15: 500 [IU] via INTRAVENOUS
  Filled 2017-09-15: qty 5

## 2017-09-15 MED ORDER — SODIUM CHLORIDE 0.9 % IJ SOLN
10.0000 mL | INTRAMUSCULAR | Status: DC | PRN
Start: 2017-09-15 — End: 2017-09-15
  Administered 2017-09-15: 10 mL via INTRAVENOUS
  Filled 2017-09-15: qty 10

## 2017-09-15 MED ORDER — DRONABINOL 2.5 MG PO CAPS: 2.5000 mg | ORAL_CAPSULE | Freq: Two times a day (BID) | ORAL | 0 refills | Status: DC

## 2017-09-15 NOTE — Telephone Encounter (Signed)
Scheduled appt per 6/27 los - called patient with appt date and time.

## 2017-09-15 NOTE — Telephone Encounter (Signed)
Patient has not been seen by PCP in 3 years.Mosie Lukes, MD will need to see patient before information can be given out.

## 2017-09-15 NOTE — Progress Notes (Addendum)
[facility] OFFICE PROGRESS NOTE   Diagnosis: Rectal cancer  INTERVAL HISTORY:   Mr. Diebold returns as scheduled.  He completed a cycle of FOLFIRI/Panitumumab 09/01/2017.  He denies nausea/vomiting.  He had mouth sores.  He is swallowing better now.  He notes the rash is less.  Bowels moving well.  He recently started a supplement which he feels has helped immensely.  He is urinating without difficulty.  No unusual headaches.  No visual symptoms.  He would like to try Marinol for appetite stimulation.  Objective:  Vital signs in last 24 hours:  Blood pressure 106/81, pulse 75, temperature 98.6 F (37 C), temperature source Oral, resp. rate 17, height _0  (1.778 m), weight 133 lb 12.8 oz (60.7 kg), SpO2 99 %.    HEENT: Healing ulceration right lateral tongue.  White coating over tongue.  No buccal thrush. Resp: Lungs clear bilaterally. Cardio: Regular rate and rhythm. GI: Abdomen soft and nontender.  No hepatomegaly.  Left lower quadrant colostomy. Vascular: No leg edema. Neuro: Alert.  Follows commands.  Speech seems pressured. Skin: No significant rash. Port-A-Cath without erythema.   Lab Results:  Lab Results  Component Value Date   WBC 2.9 (L) 09/15/2017   HGB 9.1 (L) 09/15/2017   HCT 26.5 (L) 09/15/2017   MCV 85.8 09/15/2017   PLT 180 09/15/2017   NEUTROABS 2.0 09/15/2017    Imaging:  No results found.  Medications: I have reviewed the patient's current medications.  Assessment/Plan: 1. Rectal cancer. Partially obstructing mass noted 1-2 cm from the anal verge on a colonoscopy 03/06/2012. Endoscopic ultrasound 03/14/2012 with a 7.5 mm thick, 3.2 cm wide hypoechoic, irregularly bordered mass that clearly passed into and through the muscularis propria layer of the distal rectal wall (uT3); 3 small (largest 7 mm) perirectal lymph nodes. The lymph nodes were all round, discrete, hypoechoic, homogenous; suspicious for malignant involvement (uN1).  No  RAS mutation identified by [facility] 1 testing on the colon resection specimen 06/29/2012, APC alteration identified, microsatellite stable  He began radiation and concurrent Xeloda chemotherapy on 03/20/2012, completed 04/27/2012.   Low anterior resection/coloanal anastomosis and diverting ileostomy 06/29/2012 with the final pathology revealing a T2N0 tumor with extensive fibrosis and negative margins.   Cycle 1 of adjuvant CAPOX 07/19/2012. Cycle 5 of adjuvant CAPOX 10/11/2012.   CEA 2.5 on 11/14/2012.   CEA 14.6 03/08/2013.   Restaging CT evaluation 03/08/2013 with a 4 mm pulmonary nodule left lung base not identified on comparison exam; new liver lesions including a 29 x 26 mm irregular peripheral enhancing rounded lesion in the dome of the right hepatic lobe, a less well-defined new subcapsular lesion in the lateral right hepatic lobe measuring 12 mm, a new subcapsular lesion in the anterior right hepatic lobe adjacent to the gallbladder fossa measuring 10 mm; a rounded low-density lesion in the inferior right hepatic lobe measuring 10 mm compared to 7 mm on the prior study (radiologist commented this may represent an enlarged cyst).   MRI of the abdomen 04/12/2013 confirmed multiple T2 hyperintense metastatic lesions throughout the right liver   Initiation of FOLFIRI/Avastin with genotype based irinotecan dosing per the [facility] study 04/05/2013.   Restaging CT evaluation on 06/20/2013 (after 2 cycles/4 treatments) showed improvement in the liver metastases and stable size of a left lower lobe pulmonary nodule now with central cavitation.   Continuation of FOLFIRI/Avastin.   Restaging CT evaluation 08/14/2013-decrease in the size of liver metastases, slight decrease in the size of a cavitary  left lower lobe nodule, no evidence of disease progression.   Status post right hepatic lobectomy 09/17/2013. Pathology showed multiple foci of metastatic adenocarcinoma (5 nodules of  metastatic adenocarcinoma 4 of which are subcapsular with the nodules ranging in size from 0.6-1.8 cm in greatest dimension). Margins not involved. Biopsy of a portal lymph node showed benign adipose tissue; no lymph node tissue or malignancy.   Normal CEA 11/06/2013   CT 11/06/2013 with a new pleural-based right lower chest lesion, slight in enlargement of the a left-sided lung nodule  CT 01/29/2014 with a decrease in the right lower chest pleural-based lesion and a slight increase of a left-sided lung lesion, other lung lesions were stable  CT chest 05/02/2014 with a decrease in the size of a right lower lobe pulmonary nodule months similar size of a dominant left lower lobe nodule, minimal enlargement of a smaller left lower lobe nodule, no new site of disease  CT chest 11/07/2014 with a slight increase in several left-sided lung nodules  CT chest 02/10/2015 with new small left hilar lymph nodes, a possible new left lower lobe nodule, and stability of other lung nodules  PET scan 03/12/2015 with hypermetabolic left hilar nodes, hypermetabolic left lower lobe nodules, hyper metabolic retroperitoneal nodes, intense hypermetabolism at the coloanal anastomosis, and hypermetabolic thickening in the presacral space  Cycle 1 FOLFIRI/PANITUMUMAB 05/21/2015  Cycle 2 FOLFIRI/PANITUMUMAB 06/05/2015  Cycle 3 FOLFIRI/PANITUMUMAB 06/19/2015  Cycle 4 FOLFIRI 07/10/2015  Cycle 5 FOLFIRI 07/24/2015  Restaging CTs 08/06/2015-resolution of hilar/retroperitoneal adenopathy, improvement in the hypermetabolic lung nodule, other lung nodules are stable, no new lesions  Cycle 6 FOLFIRI with PANITUMUMAB 08/28/2015 -panitumumab dose reduced  Cycle 7 FOLFIRI 09/11/2015-no panitumumab given  Cycle 8 FOLFIRI with PANITUMUMAB 09/25/2015-PANITUMUMAB dose reduced  Cycle 9 FOLFIRI 10/09/2015-no PANITUMUMAB given  Cycle 10 FOLFIRI with panitumumab 10/23/2015  CTs 11/06/2015-possible slight enlargement of a  left lower lobe nodule, no other evidence of disease progression.  CTs 02/16/2016-enlargement of left-sided pulmonary nodules and mediastinal/left hilar nodes, improved splenomegaly  CTs 06/21/2016-increased size of mediastinal/left hilar nodes, increased left lung nodules, and increased soft tissue at the porta hepatis  CTs 10/28/2016-progressive disease in the chest and abdomen with an enlarging left hilar mass and slight interval enlargement of pulmonary lesions. Right upper quadrant necrotic nodal mass significantly increased in size with mass effect on the liver and possible invasion. Significant compression of the intrahepatic IVC. New right hepatic lobe lesion. Moderate pelvic ascites.  Cycle 1 FOLFIRI/PANITUMUMAB 11/04/2016  Cycle 2 FOLFIRI/PANITUMUMAB 11/18/2016  Cycle 3 FOLFIRI/panitumumab 12/02/2016  Cycle4 FOLFIRI/PANITUMUMAB 12/15/2016  Cycle 5 FOLFIRI/PANITUMUMAB 12/30/2016  CTs 01/06/2017-no evidence of progressive disease, decreased chest adenopathy, stable left lower lobe nodules, decreased liver metastasis, decreased right retroperitoneal mass  Cycle 6 FOLFIRI/panitumumab 01/13/2017  Cycle 7 FOLFIRI/Panitumumab 01/27/2017  Cycle 8 FOLFIRI/panitumumab 02/17/2017  Cycle 9 FOLFIRI/Panitumumab 03/03/2017  Cycle 10 FOLFIRI/Panitumumab 03/24/2017  CTs 04/06/2017-enlargement of right adrenal mass (smaller than October 2019), stable left hilar fullness and lung nodules  Cycle 11 FOLFIRI/Panitumumab 04/07/2017  Cycle 12 FOLFIRI/panitumumab 04/21/2017  Cycle 13 FOLFIRI/Panitumumab 05/05/2017  Palliative radiation to the right adrenal mass 226 2019-3 02/2018  Brain MRI 07/28/2017-4intracranial metastasis in the right frontal lobe, left parietal lobe and left occipital lobe. Surrounding edema pronounced in the left occipital lobe.  SBRT to for brain lesions on 08/12/2017  CTs 08/10/2017- new left upper lobe airspace process, stable lung nodules and medius and  lymphadenopathy, decreased mass involving the adrenal gland with progression of a necrotic anterior portion of the  mass, no liver lesions  Cycle 14 FOLFIRI/panitumumab 08/19/2017, irinotecan dose reduced secondary to thrombocytopenia, Panitumumab dose escalated  Cycle 15 FOLFIRI/panitumumab 09/01/2017) Panitumumab dose reduced secondary to an early rash following cycle 14 2. History ofIrregular bowel habits/rectal bleeding secondary to #1. 3. History of Mild elevation of the liver enzymes. Question secondary to hepatic steatosis. 4. Indeterminate 8 mm posterior right liver lesion on the staging CT 03/06/2012. 5. Mildly elevated CEA at 5.7 on 03/06/2012. 6. History of radiation erythema at the groin and perineum 7. 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 No clinical evidence of thrombophlebitis or extravasation. 8. Delayed nausea following chemotherapy-Decadron prophylaxis was added with cycle 3 CAPOX-improved. 9. Ileostomy takedown 12/07/2012. 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 11. Port-A-Cath placement 12/31/204 12. Severe neutropenia secondary to chemotherapy following cycle 1 of FOLFIRI in 2015, chemotherapy was dose reduced and he received Neulasta with day 15 cycle 1  13. Nausea and vomiting following cycle 1 of FOLFIRI 14. Rectal stricture-manual/colonoscopic dilatation by Dr. Ardis Hughs 03/01/2016  APR 08/17/2016-no evidence of malignancy 15. History ofLeukopenia/Thrombocytopenia-persistent, potentially a sequelae of chemotherapy or hepatic toxicity from chemotherapy/radiation. Bone marrow biopsy 11/20/2014 showed cellular bone marrow with trilineage hematopoiesis. Significant dyspoiesis was not present and there was no evidence of metastatic carcinoma. Cytogenetic analysis showed the presence of normal male chromosomes with no observable clonal chromosomal abnormalities.  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 Genetic testing-negative genetic panel in March 2014 17. 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 Severe over the face-steroid Dosepak prescribed 07/03/2015. Improved 07/10/2015. Further improved 07/24/2015, 08/07/2015, 08/28/2015 18. Diarrhea secondary to chemotherapy-encouraged to use Imodium  19. History of Paronychia secondary to PANITUMUMAB 20. 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 History of neutropenia, thrombocytopenia 06/23/2017. Possibly related to radiation. He also has a history of suspected portal hypertension secondaryto toxicity from chemotherap 22. Seizure in the setting of brain metastases 08/05/2017, now maintained on Keppra      Disposition: Mr. Square has completed 2 cycles of FOLFIRI/Panitumumab since resuming treatment.  At today's visit he declines further treatment until he returns from a vacation in late July/early August.  In addition he declines to schedule an interim follow-up visit.  For appetite stimulation he will try Marinol 2.5 mg twice daily.  He will return for lab and a follow-up visit on 10/24/2017.  We encouraged him to contact the office in the interim with any problems.  Patient seen with Dr. Benay Spice.    Ned Card ANP/GNP-BC   09/15/2017  12:23 PM  This was a shared visit with Ned Card.  Mr. Mcclenahan has decided against further chemotherapy for now.  He appeared very talkative today compared to previous visits.  He was here with his wife.  He agrees to a follow-up visit after he returns from a vacation.  He reports resolution of neurologic symptoms.  He is no longer taking Decadron.  Julieanne Manson, MD

## 2017-09-15 NOTE — Telephone Encounter (Signed)
Jacob [name], CM with [employer] was called and advised the patient hasn't been seen by Dr. Charlett Blake in 3 years and he will need to be seen by her before information will be released. She says she has spoken to the oncologist and the appropriate paperwork is being faxed to her to take care of everything.

## 2017-09-16 NOTE — Telephone Encounter (Signed)
Thank you :)

## 2017-09-17 ENCOUNTER — Inpatient Hospital Stay: Payer: BC Managed Care – PPO

## 2017-09-19 ENCOUNTER — Ambulatory Visit
Admission: RE | Admit: 2017-09-19 | Discharge: 2017-09-19 | Disposition: A | Discharge status: Home / Self Care | Payer: BC Managed Care – PPO | Source: Ambulatory Visit | Attending: Radiation Oncology | Admitting: Radiation Oncology

## 2017-09-19 ENCOUNTER — Encounter: Payer: Self-pay | Admitting: Radiation Oncology

## 2017-09-19 ENCOUNTER — Other Ambulatory Visit: Payer: Self-pay

## 2017-09-19 VITALS — BP 106/44 | HR 94 | Temp 97.7°F | Resp 20 | Ht 70.0 in | Wt 137.0 lb

## 2017-09-19 DIAGNOSIS — Z08 Encounter for follow-up examination after completed treatment for malignant neoplasm: Secondary | ICD-10-CM | POA: Diagnosis not present

## 2017-09-19 DIAGNOSIS — Z923 Personal history of irradiation: Secondary | ICD-10-CM | POA: Insufficient documentation

## 2017-09-19 DIAGNOSIS — Z880 Allergy status to penicillin: Secondary | ICD-10-CM | POA: Diagnosis not present

## 2017-09-19 DIAGNOSIS — F688 Other specified disorders of adult personality and behavior: Secondary | ICD-10-CM | POA: Diagnosis not present

## 2017-09-19 DIAGNOSIS — C20 Malignant neoplasm of rectum: Secondary | ICD-10-CM | POA: Insufficient documentation

## 2017-09-19 DIAGNOSIS — C7971 Secondary malignant neoplasm of right adrenal gland: Secondary | ICD-10-CM | POA: Insufficient documentation

## 2017-09-19 DIAGNOSIS — C7972 Secondary malignant neoplasm of left adrenal gland: Secondary | ICD-10-CM

## 2017-09-19 DIAGNOSIS — Z79899 Other long term (current) drug therapy: Secondary | ICD-10-CM | POA: Diagnosis not present

## 2017-09-19 DIAGNOSIS — C7931 Secondary malignant neoplasm of brain: Secondary | ICD-10-CM | POA: Insufficient documentation

## 2017-09-19 DIAGNOSIS — R600 Localized edema: Secondary | ICD-10-CM | POA: Insufficient documentation

## 2017-09-19 NOTE — Addendum Note (Signed)
Encounter addended by: Malena Edman, RN on: 09/19/2017 12:14 PM  Actions taken: Charge Capture section accepted

## 2017-09-19 NOTE — Progress Notes (Signed)
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  Interval Since Last Radiation:  6 weeks   08/12/17 SRS Treatment: PTV1-4 // 20 Gy in 1 fraction to brain 1. PTV1: Left Occipital 66mm  2. PTV2: Left Occipital 28mm  3. PTV3: Left Parietal 33mm  4. PTV4: Right Frontal 49mm   05/17/2017-05/31/2017 SBRT Treatment:  The right adrenal gland was treated to 40 Gy in 8 fractions of 5 Gy using the SBRT/SRT-3D technique.  03/20/2012 through 04/27/2012: The patient was initially treated using a whole pelvis technique with 4 fields to a dose of 45 gray at 1.8 gray per fraction. The patient then received a cone down, 4 field boost treatment for an additional 5.4 gray. The total dose was 50.4 gray.    Narrative:  The patient returns today for routine follow-up.  He recently completed a course of SRS treatment to the brain, and reports that since receiving radiotherapy to the right adrenal gland, he has been experiencing significant improvement in his pain.  He states that his wife is concerned by the fact that he is so talkative, but states that he now has the ability to talk given the fact that he has had hoarseness from where his disease in the lungs has been treated.  He feels that this is improving, and is hoping to avoid any surgery for this.  He is now able to sleep but did have some difficulty during the course of his steroids and tapering, and states that he was unable to get a good nights rest for almost a month.  He is also stopped several of his other medications for conditions like overactive bladder, he feels that this is improved.  He also is planning a chemotherapy holiday.  He is due to see Dr. Benay Spice again in August.  No  other complaints are verbalized.                           ALLERGIES:  is allergic to penicillins.  Meds: Current Outpatient Medications  Medication Sig Dispense Refill  . levETIRAcetam (KEPPRA) 500 MG tablet Take 1 tablet (500 mg total) by mouth 2 (two) times daily. 60 tablet 0  . ALPRAZolam (XANAX) 0.25 MG tablet Take 1 tablet (0.25 mg total) by mouth at bedtime as needed for anxiety. (Patient not taking: Reported on 09/19/2017) 30 tablet 1  . docusate sodium (STOOL SOFTENER) 100 MG capsule Take 100-200 mg by mouth daily as needed (constipation).     Marland Kitchen dronabinol (MARINOL) 2.5 MG capsule Take 1 capsule (2.5 mg total) by mouth 2 (two) times daily before a meal. (Patient not taking: Reported on 09/19/2017) 14 capsule 0  . lidocaine-prilocaine (EMLA) cream Apply 1 application topically as needed. Apply to port site 1 hour prior to use. (Patient not taking: Reported on 09/19/2017) 30 g 3  . magic mouthwash SOLN Take 5 mLs by mouth 3 (three) times daily as needed for mouth pain. Swish and spit 5 mLs (Patient not taking: Reported on 09/19/2017) 240 mL 0  . minocycline (MINOCIN,DYNACIN) 100 MG capsule Take 1 capsule (100 mg total) by mouth 2 (two) times daily. (Patient not taking: Reported on 09/15/2017) 60 capsule 2  .  naproxen sodium (ALEVE) 220 MG tablet Take 220 mg by mouth 2 (two) times daily as needed (for headache).     . oxyCODONE (OXY IR/ROXICODONE) 5 MG immediate release tablet Take 1 tablet (5 mg total) by mouth every 6 (six) hours as needed for severe pain. (Patient not taking: Reported on 09/15/2017) 30 tablet 0  . prochlorperazine (COMPAZINE) 10 MG tablet Take 1 tablet (10 mg total) by mouth every 6 (six) hours as needed for nausea or vomiting. (Patient not taking: Reported on 09/15/2017) 30 tablet 0   No current facility-administered medications for this encounter.    Facility-Administered Medications Ordered in Other Encounters  Medication Dose Route Frequency Provider Last Rate Last Dose  .  atropine injection 0.5 mg  0.5 mg Intravenous Once PRN Betsy Coder B, MD      . atropine injection 0.5 mg  0.5 mg Intravenous Once PRN Ladell Pier, MD      . clindamycin (CLEOCIN) 900 mg in dextrose 5 % 50 mL IVPB  900 mg Intravenous 60 min Pre-Op Leighton Ruff, MD       And  . gentamicin (GARAMYCIN) 340 mg in dextrose 5 % 50 mL IVPB  5 mg/kg Intravenous 60 min Pre-Op Leighton Ruff, MD        Physical Findings:  height is 5\' 10"  (1.778 m) and weight is 137 lb (62.1 kg). His oral temperature is 97.7 F (36.5 C). His blood pressure is 106/44 (abnormal) and his pulse is 94. His respiration is 20 and oxygen saturation is 96%.  Pain Assessment Pain Score: 0-No pain/10 In general this is a chronically ill and cachectic caucasian male in no acute distress. He's alert and oriented x4 and appropriate throughout the examination. Cardiopulmonary assessment is negative for acute distress and he exhibits normal effort. He has patchy alopecia along the posterior skull. No focal neurologic deficits are noted.  Lab Findings: Lab Results  Component Value Date   WBC 2.9 (L) 09/15/2017   HGB 9.1 (L) 09/15/2017   HCT 26.5 (L) 09/15/2017   MCV 85.8 09/15/2017   PLT 180 09/15/2017     Radiographic Findings: No results found.  Impression/Plan: 1.  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  The patient appears to be doing better since completing his steroid taper, apparently he did have some difficulties with this process I was not aware at the time however.  Encouraged him to keep [place] informed of any concerns he has moving forward, and we did discuss the rationale for repeat MRI in 3 months from the time of treatment which he will be due for next month.  He is in agreement to proceed we will meet back after his case is reviewed in multidisciplinary brain oncology conference. 2. Bilateral lower extremity edema. The patient has been counseled by Dr. Benay Spice  to wear compression stockings. He is planning to start wearing these but was given precautions to call if he has unilateral edema or shortness of breath. 3. Changes in personality and care.  The patient may be experiencing some of the symptoms from medication versus site from his brain metastases, but as this is multifactorial I would like him to also consider meeting with our outpatient palliative care team.  He is receiving some of these benefits already at home, and we will communicate between the 2 groups so that we can know best how to help him.    Carola Rhine, PAC

## 2017-09-20 ENCOUNTER — Ambulatory Visit (HOSPITAL_COMMUNITY): Admission: RE | Admit: 2017-09-20 | Payer: BC Managed Care – PPO | Source: Ambulatory Visit | Admitting: Otolaryngology

## 2017-09-20 ENCOUNTER — Encounter (HOSPITAL_COMMUNITY): Admission: RE | Payer: Self-pay | Source: Ambulatory Visit

## 2017-09-20 SURGERY — LARYNGOPLASTY
Anesthesia: General | Laterality: Bilateral

## 2017-09-21 ENCOUNTER — Encounter: Payer: Self-pay | Admitting: Oncology

## 2017-09-21 ENCOUNTER — Telehealth (HOSPITAL_COMMUNITY): Payer: Self-pay | Admitting: Psychiatry

## 2017-09-21 DIAGNOSIS — C20 Malignant neoplasm of rectum: Secondary | ICD-10-CM

## 2017-09-21 NOTE — Progress Notes (Unsigned)
I discussed his case with psychiatry earlier today.  They recommended to clonazepam.  They will see him early next week for enrollment in an outpatient psychiatry program and psychiatry evaluation.  I discussed these recommendations with Jacob [name] by telephone at approximately 5 PM today.  He is adamant that he will not take clonazepam.  He says he does not plan to enroll in the IOP program, but will seek an individual psychiatry evaluation at [place].  He indicates he is using marijuana to help with pain and insomnia.  He requested a prescription for higher dose of Marinol.  I explained we cannot prescribe this at present.  He agrees to a follow-up phone call from [place] on 09/23/2017.

## 2017-09-21 NOTE — Progress Notes (Signed)
  Oncology Nurse Navigator Documentation  Navigator Location: [facility] (09/21/17 1050)   )Navigator Encounter Type: Telephone (09/21/17 1050) Telephone: Incoming Call (09/21/17 1050)   Jacob [name] called to let Dr. Benay Spice know that he is willing to see a psychiatrist for help with mood and sleep.                     Barriers/Navigation Needs: Coordination of Care (09/21/17 1050)     Referrals: Other(Psychiatric Referral) (09/21/17 1050)          Acuity: Level 2 (09/21/17 1050)         Time Spent with Patient: 15 (09/21/17 1050)

## 2017-09-21 NOTE — Progress Notes (Signed)
Urgent referral placed to [facility].

## 2017-09-21 NOTE — Progress Notes (Signed)
Called Mrs. Fontenette to update her on what Dr. Benay Spice and Dr. Daron Offer [facility]) discussed. Dr. Benay Spice will call and speak with patient this afternoon to discuss change from Xanax to Klonopin and the referral to [facility] Program that will begin on 09/26/17. Dr. Daron Offer will facilitate this and have someone from [facility] call pt to discuss program details.

## 2017-09-21 NOTE — Progress Notes (Signed)
Patient's wife presented at our [facility] on 09/20/17 asking to speak with Dr. Benay Spice and Ned Card NP about concerns she has with her husband. Jacob [name] has been exhibiting signs of mania over the last three or four days; spending large amounts of money they do not have, not sleeping for 48 hours, rapid speech and flight of ideas. Mrs. Jacob [name] states that this has not happened before. Patient is adopted and they do not know his family hx of mental issues. Patient is resistant to the idea that there is an issue.   I explained to Jacob [name] that if she thought that he were in danger of hurting himself or someone else that she can reach out to the police department or take him to the nearest ED.   I will reach out to [facility] to facilitate a direct conversation between Dr. Benay Spice and a psychiatrist at [facility] to see how we can help [name].

## 2017-09-21 NOTE — Telephone Encounter (Signed)
D:  Dr. Daron Offer referred pt to [facility].  Placed call to pt to orient him to IOP.  Pt declined MH-IOP at this time.  States he is just wanting individual counseling.  First available with a therapist in the clinic is 10-12-17 with Kathryne Hitch, LCSW.  Pt's wife got on the phone with pt to state they wanted something sooner.  Mr. Schaible states he is wanting just a therapist.  "They are wanting to put me on psychiatric meds and I don't need to be put on all that.  I just want a therapist."  A:  Provided pt with [facility] phone number [phone]).  Inform Dr. Daron Offer.  R:  Pt and wife receptive.

## 2017-09-23 ENCOUNTER — Other Ambulatory Visit: Payer: Self-pay | Admitting: Radiation Oncology

## 2017-09-23 MED ORDER — FLUCONAZOLE 100 MG PO TABS: 100.0000 mg | ORAL_TABLET | Freq: Every day | ORAL | 0 refills | Status: DC

## 2017-09-23 NOTE — Progress Notes (Signed)
  Oncology Nurse Navigator Documentation  Navigator Location: [facility] (09/23/17 1617)   )Navigator Encounter Type: Telephone (09/23/17 1617) Telephone: Outgoing Call (09/23/17 1617)  Called to speak with pt's wife to offer support. She asked if I would talk directly to [place]. During conversation I noted flight of ideas, pressured speech and disorganized thinking. Patient is adamant on not seeking treatment for mood issues. Patient understands that he has been scheduled for an appointment at [facility] on 10/12/17 but states that he doesn't "trust anyone." I explained to both patient and wife that they can seek help by calling the [facility] @ [phone] or by presenting to the nearest emergency room for evaluation.                           Interventions: Psycho-social support (09/23/17 1617)            Acuity: Level 3 (09/23/17 1617)         Time Spent with Patient: 30 (09/23/17 1617)

## 2017-09-26 ENCOUNTER — Ambulatory Visit: Payer: Self-pay | Admitting: *Deleted

## 2017-09-26 ENCOUNTER — Telehealth: Payer: Self-pay | Admitting: Radiation Oncology

## 2017-09-26 NOTE — Telephone Encounter (Signed)
Author phoned pt's spouse Jacob [name] re: new pt. appointment request with Dr. Charlett Blake in setting of coughing up blood. Pt. refusing to go to ED per wife. Wife states pt has been missing work, not taking calls from friends, not sleeping, and just "being mean". Wife states she feels safe, but "does not know what will happen tomorrow". Per wife, pt. Is not homicidal or suicidal at this time. Wife concerned about the effect keppra may be having on his behavior. Author encouraged wife to tell pt. to seek medical care for the rust-colored blood he has been coughing up, and wife says, "I have, but he won't listen". Author also encouraged wife to reach out to behavioral health inpatient location, or call 911 if pt. Becomes suicidal/homicidal, or if wife feels unsafe, and wife verbalized understanding. Appointment with Dr. Charlett Blake made for 7/11 at 330PM. Dr. Charlett Blake made aware.

## 2017-09-26 NOTE — Telephone Encounter (Signed)
thanks

## 2017-09-26 NOTE — Telephone Encounter (Signed)
Copied from [place] [phone]. Topic: Quick Communication - See Telephone Encounter >> Sep 26, 2017 11:46 AM Antonieta Iba C wrote: CRM for notification. See Telephone encounter for: 09/26/17.  Anderson [place] nurse with [facility] went out to the home and was advised by pt's spouse Jacob [name] that pt is demonstrating aggressive behavior. They would like to know if provider could work pt in to be seen as soon as possible.   CB: [phone] - Spouse Jacob [name]

## 2017-09-26 NOTE — Telephone Encounter (Signed)
Patient phoned requesting a visit with Dr. Charlett Blake for a cpe and an acute visit for spitting up blood and SOB for a week now. Stated he has just completed chemotherapy. Refused to accept acute visit with other office providers at this time. I will skype and attempt to get an appointment with Dr.Blyth and let him know either way. Instructed to seek immediate treatment for his symptoms.

## 2017-09-26 NOTE — Telephone Encounter (Signed)
I am happy to reestablish the patient but it has been so many years we will need to do a longer visit and have no room today. If he is bleeding he needs to be seen but please find him an appt as soon as able could come in early or stay a little late on Thursday or Friday if that helps

## 2017-09-26 NOTE — Telephone Encounter (Signed)
Pt's wife called stating pt has cancer but "Won't see any of his cancer doctors anymore." States he is "manic and getting worse." States he wants to see Dr. Charlett Blake only. Has not been seen in office since 2013. Wife is not on DPR and she is at work, husband not present. Instructed to go to ED if pt escalating; per her report "Manic disorder." States she will leave work and have husband call back for NT.   Answer Assessment - Initial Assessment Questions 1. REASON FOR CALL or QUESTION: "What is your reason for calling today?" or "How can I best help you?" or "What question do you have that I can help answer?"     Pts wife calling from work; not on [place]. States husband will not call. Wife will leave work and call back with husband present .  Protocols used: INFORMATION ONLY CALL-A-AH

## 2017-09-26 NOTE — Telephone Encounter (Signed)
I contacted the patient's wife [name] to review options of seeing Dr. Mickeal Skinner to determine if he needs to be on Keppra and a safe option to discontinue this if it is necessary and/or related to symptoms of psychotic behavior.

## 2017-09-27 ENCOUNTER — Ambulatory Visit: Payer: BC Managed Care – PPO | Admitting: Internal Medicine

## 2017-09-27 ENCOUNTER — Inpatient Hospital Stay: Payer: BC Managed Care – PPO | Attending: Oncology | Admitting: Internal Medicine

## 2017-09-27 ENCOUNTER — Encounter: Payer: Self-pay | Admitting: Internal Medicine

## 2017-09-27 ENCOUNTER — Telehealth: Payer: Self-pay | Admitting: Internal Medicine

## 2017-09-27 VITALS — BP 125/87 | HR 107 | Temp 98.1°F | Resp 18 | Ht 70.0 in | Wt 136.3 lb

## 2017-09-27 DIAGNOSIS — R4189 Other symptoms and signs involving cognitive functions and awareness: Secondary | ICD-10-CM | POA: Diagnosis not present

## 2017-09-27 DIAGNOSIS — R569 Unspecified convulsions: Secondary | ICD-10-CM | POA: Diagnosis not present

## 2017-09-27 DIAGNOSIS — C7931 Secondary malignant neoplasm of brain: Secondary | ICD-10-CM

## 2017-09-27 DIAGNOSIS — C20 Malignant neoplasm of rectum: Secondary | ICD-10-CM | POA: Diagnosis present

## 2017-09-27 DIAGNOSIS — R609 Edema, unspecified: Secondary | ICD-10-CM | POA: Insufficient documentation

## 2017-09-27 MED ORDER — LAMOTRIGINE 25 MG PO TABS: 50.0000 mg | ORAL_TABLET | Freq: Two times a day (BID) | ORAL | 0 refills | Status: DC

## 2017-09-27 MED ORDER — DIVALPROEX SODIUM 500 MG PO DR TAB: 500.0000 mg | DELAYED_RELEASE_TABLET | Freq: Two times a day (BID) | ORAL | 0 refills | Status: DC

## 2017-09-27 NOTE — Telephone Encounter (Signed)
Appointments scheduled AVS/Calendar printed per 7/9 los

## 2017-09-27 NOTE — Progress Notes (Signed)
[facility] [place], [place] [address] [phone]   New Patient Evaluation  Date of Service: 09/27/17 Patient Name: Jacob [name] Patient MRN: [id number] Patient DOB: Apr 26, 1970 Provider: Ventura Sellers, MD  Identifying Statement:  Jacob [name] is a 47 y.o. male with Brain metastasis ([place]) [C79.31] who presents for initial consultation and evaluation regarding cancer associated neurologic deficits.    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[place])      03/20/2012 - 04/27/2012 Radiation Therapy    RT for 28 Fractions w / Xeloda       04/30/2015 -  Chemotherapy    The patient had palonosetron (ALOXI) injection 0.25 mg, 0.25 mg, Intravenous,  Once, 25 of 27 cycles Administration: 0.25 mg (05/21/2015), 0.25 mg (06/05/2015), 0.25 mg (06/19/2015), 0.25 mg (07/10/2015), 0.25 mg (07/24/2015), 0.25 mg (08/28/2015), 0.25 mg (09/11/2015), 0.25 mg (09/25/2015), 0.25 mg (10/09/2015), 0.25 mg (10/23/2015), 0.25 mg (11/04/2016), 0.25 mg (11/18/2016), 0.25 mg (12/02/2016), 0.25 mg (12/15/2016), 0.25 mg (12/30/2016), 0.25 mg (01/13/2017), 0.25 mg (01/27/2017), 0.25 mg (02/17/2017), 0.25 mg (03/03/2017), 0.25 mg (03/24/2017), 0.25 mg (04/07/2017), 0.25 mg (04/21/2017), 0.25 mg (05/05/2017), 0.25 mg (08/19/2017), 0.25 mg (09/01/2017) pegfilgrastim (NEULASTA) injection 6 mg, 6 mg, Subcutaneous, Once, 23 of 23 cycles Administration: 6 mg (05/23/2015), 6 mg (06/07/2015), 6 mg (06/21/2015), 6 mg (07/12/2015), 6 mg (07/26/2015), 6 mg (08/30/2015), 6 mg (09/13/2015), 6 mg (09/27/2015), 6 mg (10/11/2015), 6 mg (10/25/2015), 6 mg (11/06/2016), 6 mg (11/20/2016), 6 mg (12/04/2016), 6 mg (12/17/2016), 6 mg (01/01/2017), 6 mg (01/15/2017), 6 mg (01/29/2017), 6 mg (02/19/2017), 6 mg (03/05/2017), 6 mg  (03/26/2017), 6 mg (04/09/2017), 6 mg (04/23/2017), 6 mg (05/07/2017) pegfilgrastim-cbqv (UDENYCA) injection 6 mg, 6 mg, Subcutaneous, Once, 1 of 1 cycle Administration: 6 mg (08/22/2017) irinotecan (CAMPTOSAR) 258 mg in dextrose 5 % 500 mL chemo infusion, 135 mg/m2 = 258 mg (75 % of original dose 180 mg/m2), Intravenous,  Once, 25 of 27 cycles Dose modification: 135 mg/m2 (original dose 180 mg/m2, Cycle 1, Reason: Provider Judgment), 100 mg/m2 (original dose 180 mg/m2, Cycle 24, Reason: Provider Judgment) Administration: 258 mg (05/21/2015), 260 mg (06/05/2015), 258 mg (06/19/2015), 258 mg (07/10/2015), 260 mg (07/24/2015), 260 mg (08/28/2015), 260 mg (09/11/2015), 260 mg (09/25/2015), 260 mg (10/23/2015), 260 mg (11/04/2016), 240 mg (11/18/2016), 240 mg (12/02/2016), 240 mg (12/15/2016), 240 mg (12/30/2016), 240 mg (01/13/2017), 240 mg (01/27/2017), 240 mg (02/17/2017), 240 mg (03/03/2017), 240 mg (03/24/2017), 240 mg (04/07/2017), 240 mg (04/21/2017), 240 mg (05/05/2017), 180 mg (08/19/2017), 180 mg (09/01/2017) leucovorin 764 mg in dextrose 5 % 250 mL infusion, 400 mg/m2 = 764 mg, Intravenous,  Once, 25 of 27 cycles Administration: 764 mg (05/21/2015), 764 mg (06/05/2015), 764 mg (06/19/2015), 764 mg (07/10/2015), 760 mg (07/24/2015), 760 mg (08/28/2015), 760 mg (09/11/2015), 760 mg (09/25/2015), 764 mg (10/09/2015), 764 mg (10/23/2015), 764 mg (11/04/2016), 764 mg (11/18/2016), 764 mg (12/02/2016), 764 mg (12/15/2016), 716 mg (12/30/2016), 716 mg (01/13/2017), 716 mg (01/27/2017), 716 mg (02/17/2017), 716 mg (03/03/2017), 716 mg (03/24/2017), 716 mg (04/07/2017), 716 mg (04/21/2017), 716 mg (05/05/2017), 736 mg (08/19/2017), 736 mg (09/01/2017) fluorouracil (ADRUCIL) chemo injection 750 mg, 400 mg/m2 = 750 mg, Intravenous,  Once, 10 of 10 cycles Administration: 750 mg (05/21/2015), 750 mg (06/05/2015), 750  mg (06/19/2015), 750 mg (07/10/2015), 750 mg (07/24/2015), 750 mg (08/28/2015), 750 mg (09/11/2015), 750 mg (09/25/2015), 750 mg (10/09/2015), 750 mg  (10/23/2015) leucovorin injection 38 mg, 20 mg/m2 = 38 mg, Intravenous,  Once, 1 of 1 cycle fosaprepitant (EMEND) 150 mg, dexamethasone (DECADRON) 12 mg in sodium chloride 0.9 % 145 mL IVPB, , Intravenous,  Once, 25 of 27 cycles Administration:  (05/21/2015),  (06/05/2015),  (06/19/2015),  (07/10/2015),  (07/24/2015),  (08/28/2015),  (09/11/2015),  (09/25/2015),  (10/09/2015),  (10/23/2015),  (11/04/2016),  (11/18/2016),  (12/02/2016),  (12/15/2016),  (12/30/2016),  (01/13/2017),  (01/27/2017),  (02/17/2017),  (03/03/2017),  (03/24/2017),  (04/07/2017),  (04/21/2017),  (05/05/2017),  (08/19/2017),  (09/01/2017) fluorouracil (ADRUCIL) 4,600 mg in sodium chloride 0.9 % 58 mL chemo infusion, 2,400 mg/m2 = 4,600 mg, Intravenous, 1 Day/Dose, 25 of 27 cycles Administration: 4,600 mg (05/21/2015), 4,600 mg (06/05/2015), 4,600 mg (06/19/2015), 4,600 mg (07/10/2015), 4,600 mg (07/24/2015), 4,600 mg (08/28/2015), 4,600 mg (09/11/2015), 4,600 mg (09/25/2015), 4,600 mg (10/09/2015), 4,600 mg (10/23/2015), 4,600 mg (11/04/2016), 4,600 mg (11/18/2016), 4,600 mg (12/02/2016), 4,600 mg (12/15/2016), 4,300 mg (12/30/2016), 4,300 mg (01/13/2017), 4,300 mg (01/27/2017), 4,300 mg (02/17/2017), 4,300 mg (03/03/2017), 4,300 mg (03/24/2017), 4,300 mg (04/07/2017), 4,300 mg (04/21/2017), 4,300 mg (05/05/2017), 4,300 mg (08/19/2017), 4,400 mg (09/01/2017)  for chemotherapy treatment.        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[place])      04/30/2015 -  Chemotherapy    The patient had palonosetron (ALOXI) injection 0.25 mg, 0.25 mg, Intravenous,  Once, 25 of 27 cycles Administration: 0.25 mg (05/21/2015), 0.25 mg (06/05/2015), 0.25 mg (06/19/2015), 0.25 mg (07/10/2015), 0.25 mg (07/24/2015), 0.25 mg (08/28/2015), 0.25 mg (09/11/2015), 0.25 mg (09/25/2015), 0.25 mg (10/09/2015), 0.25 mg (10/23/2015), 0.25 mg (11/04/2016), 0.25 mg (11/18/2016), 0.25 mg (12/02/2016), 0.25 mg (12/15/2016), 0.25 mg (12/30/2016), 0.25 mg (01/13/2017),  0.25 mg (01/27/2017), 0.25 mg (02/17/2017), 0.25 mg (03/03/2017), 0.25 mg (03/24/2017), 0.25 mg (04/07/2017), 0.25 mg (04/21/2017), 0.25 mg (05/05/2017), 0.25 mg (08/19/2017), 0.25 mg (09/01/2017) pegfilgrastim (NEULASTA) injection 6 mg, 6 mg, Subcutaneous, Once, 23 of 23 cycles Administration: 6 mg (05/23/2015), 6 mg (06/07/2015), 6 mg (06/21/2015), 6 mg (07/12/2015), 6 mg (07/26/2015), 6 mg (08/30/2015), 6 mg (09/13/2015), 6 mg (09/27/2015), 6 mg (10/11/2015), 6 mg (10/25/2015), 6 mg (11/06/2016), 6 mg (11/20/2016), 6 mg (12/04/2016), 6 mg (12/17/2016), 6 mg (01/01/2017), 6 mg (01/15/2017), 6 mg (01/29/2017), 6 mg (02/19/2017), 6 mg (03/05/2017), 6 mg (03/26/2017), 6 mg (04/09/2017), 6 mg (04/23/2017), 6 mg (05/07/2017) pegfilgrastim-cbqv (UDENYCA) injection 6 mg, 6 mg, Subcutaneous, Once, 1 of 1 cycle Administration: 6 mg (08/22/2017) irinotecan (CAMPTOSAR) 258 mg in dextrose 5 % 500 mL chemo infusion, 135 mg/m2 = 258 mg (75 % of original dose 180 mg/m2), Intravenous,  Once, 25 of 27 cycles Dose modification: 135 mg/m2 (original dose 180 mg/m2, Cycle 1, Reason: Provider Judgment), 100 mg/m2 (original dose 180 mg/m2, Cycle 24, Reason: Provider Judgment) Administration: 258 mg (05/21/2015), 260 mg (06/05/2015), 258 mg (06/19/2015), 258 mg (07/10/2015), 260 mg (07/24/2015), 260 mg (08/28/2015), 260 mg (09/11/2015), 260 mg (09/25/2015), 260 mg (10/23/2015), 260 mg (11/04/2016), 240 mg (11/18/2016), 240 mg (12/02/2016), 240 mg (12/15/2016), 240 mg (12/30/2016), 240 mg (01/13/2017), 240 mg (01/27/2017), 240 mg (02/17/2017), 240 mg (03/03/2017), 240 mg (03/24/2017), 240 mg (04/07/2017), 240 mg (04/21/2017), 240 mg (05/05/2017), 180 mg (08/19/2017), 180 mg (09/01/2017) leucovorin 764 mg in dextrose 5 % 250 mL infusion, 400 mg/m2 = 764 mg, Intravenous,  Once, 25  of 27 cycles Administration: 764 mg (05/21/2015), 764 mg (06/05/2015), 764 mg (06/19/2015), 764 mg (07/10/2015), 760 mg (07/24/2015), 760 mg (08/28/2015), 760 mg (09/11/2015), 760 mg (09/25/2015), 764 mg (10/09/2015), 764 mg  (10/23/2015), 764 mg (11/04/2016), 764 mg (11/18/2016), 764 mg (12/02/2016), 764 mg (12/15/2016), 716 mg (12/30/2016), 716 mg (01/13/2017), 716 mg (01/27/2017), 716 mg (02/17/2017), 716 mg (03/03/2017), 716 mg (03/24/2017), 716 mg (04/07/2017), 716 mg (04/21/2017), 716 mg (05/05/2017), 736 mg (08/19/2017), 736 mg (09/01/2017) fluorouracil (ADRUCIL) chemo injection 750 mg, 400 mg/m2 = 750 mg, Intravenous,  Once, 10 of 10 cycles Administration: 750 mg (05/21/2015), 750 mg (06/05/2015), 750 mg (06/19/2015), 750 mg (07/10/2015), 750 mg (07/24/2015), 750 mg (08/28/2015), 750 mg (09/11/2015), 750 mg (09/25/2015), 750 mg (10/09/2015), 750 mg (10/23/2015) leucovorin injection 38 mg, 20 mg/m2 = 38 mg, Intravenous,  Once, 1 of 1 cycle fosaprepitant (EMEND) 150 mg, dexamethasone (DECADRON) 12 mg in sodium chloride 0.9 % 145 mL IVPB, , Intravenous,  Once, 25 of 27 cycles Administration:  (05/21/2015),  (06/05/2015),  (06/19/2015),  (07/10/2015),  (07/24/2015),  (08/28/2015),  (09/11/2015),  (09/25/2015),  (10/09/2015),  (10/23/2015),  (11/04/2016),  (11/18/2016),  (12/02/2016),  (12/15/2016),  (12/30/2016),  (01/13/2017),  (01/27/2017),  (02/17/2017),  (03/03/2017),  (03/24/2017),  (04/07/2017),  (04/21/2017),  (05/05/2017),  (08/19/2017),  (09/01/2017) fluorouracil (ADRUCIL) 4,600 mg in sodium chloride 0.9 % 58 mL chemo infusion, 2,400 mg/m2 = 4,600 mg, Intravenous, 1 Day/Dose, 25 of 27 cycles Administration: 4,600 mg (05/21/2015), 4,600 mg (06/05/2015), 4,600 mg (06/19/2015), 4,600 mg (07/10/2015), 4,600 mg (07/24/2015), 4,600 mg (08/28/2015), 4,600 mg (09/11/2015), 4,600 mg (09/25/2015), 4,600 mg (10/09/2015), 4,600 mg (10/23/2015), 4,600 mg (11/04/2016), 4,600 mg (11/18/2016), 4,600 mg (12/02/2016), 4,600 mg (12/15/2016), 4,300 mg (12/30/2016), 4,300 mg (01/13/2017), 4,300 mg (01/27/2017), 4,300 mg (02/17/2017), 4,300 mg (03/03/2017), 4,300 mg (03/24/2017), 4,300 mg (04/07/2017), 4,300 mg (04/21/2017), 4,300 mg (05/05/2017), 4,300 mg (08/19/2017), 4,400 mg (09/01/2017)  for chemotherapy treatment.         History of Present Illness: The patient's records from the referring physician were obtained and reviewed and the patient interviewed to confirm this HPI.  Jacob [name] presents for management of seizures and recent behavioral changes.  He initially presented in May 2019 with a witnessed episode of staring, followed by loss of consciousness and post-event confusion.  This was preceded by "flashing lights".  The flashing lights had been recurring for several weeks at least, but had not led to loss of consciousness previously.  At the ED was started on Keppra, Decadron increased to 8mg  BID, and discharged.  He had earlier been found to have several brain metastases (including occipital) on screening MRI after complaints of visual field deficit.  At present, he is "on a break" from his FOLFIRI chemotherapy, and is unsure if he will resume therapy as recommended.  His wife has noticed considerable behavioral changes in the past weeks since starting Keppra, including aggressive/moodiness, "staying up really late, talking very fast", "spending money irresponsibly".  He is now somewhat paranoid and skeptical of medical therapy in general.  He denies interest in harming himself or others.   Medications: Current Outpatient Medications on File Prior to Visit  Medication Sig Dispense Refill  . docusate sodium (STOOL SOFTENER) 100 MG capsule Take 100-200 mg by mouth daily as needed (constipation).     Marland Kitchen dronabinol (MARINOL) 2.5 MG capsule Take 1 capsule (2.5 mg total) by mouth 2 (two) times daily before a meal. 14 capsule 0  . levETIRAcetam (KEPPRA) 500 MG tablet Take 1 tablet (500 mg  total) by mouth 2 (two) times daily. 60 tablet 0  . lidocaine-prilocaine (EMLA) cream Apply 1 application topically as needed. Apply to port site 1 hour prior to use. 30 g 3  . minocycline (MINOCIN,DYNACIN) 100 MG capsule Take 1 capsule (100 mg total) by mouth 2 (two) times daily. 60 capsule 2  . naproxen sodium (ALEVE) 220  MG tablet Take 220 mg by mouth 2 (two) times daily as needed (for headache).     . ALPRAZolam (XANAX) 0.25 MG tablet Take 1 tablet (0.25 mg total) by mouth at bedtime as needed for anxiety. (Patient not taking: Reported on 09/19/2017) 30 tablet 1  . fluconazole (DIFLUCAN) 100 MG tablet Take 1 tablet (100 mg total) by mouth daily. Take 2 tabs x 1, then 1 tab QD x 9 additional days. (Patient not taking: Reported on 09/27/2017) 11 tablet 0  . magic mouthwash SOLN Take 5 mLs by mouth 3 (three) times daily as needed for mouth pain. Swish and spit 5 mLs (Patient not taking: Reported on 09/19/2017) 240 mL 0  . oxyCODONE (OXY IR/ROXICODONE) 5 MG immediate release tablet Take 1 tablet (5 mg total) by mouth every 6 (six) hours as needed for severe pain. (Patient not taking: Reported on 09/15/2017) 30 tablet 0  . prochlorperazine (COMPAZINE) 10 MG tablet Take 1 tablet (10 mg total) by mouth every 6 (six) hours as needed for nausea or vomiting. (Patient not taking: Reported on 09/15/2017) 30 tablet 0   Current Facility-Administered Medications on File Prior to Visit  Medication Dose Route Frequency Provider Last Rate Last Dose  . atropine injection 0.5 mg  0.5 mg Intravenous Once PRN Betsy Coder B, MD      . atropine injection 0.5 mg  0.5 mg Intravenous Once PRN Ladell Pier, MD      . clindamycin (CLEOCIN) 900 mg in dextrose 5 % 50 mL IVPB  900 mg Intravenous 60 min Pre-Op Leighton Ruff, MD       And  . gentamicin (GARAMYCIN) 340 mg in dextrose 5 % 50 mL IVPB  5 mg/kg Intravenous 60 min Pre-Op Leighton Ruff, MD        Allergies:  Allergies  Allergen Reactions  . Penicillins Rash    Childhood allergy Has patient had a PCN reaction causing immediate rash, facial/tongue/throat swelling, SOB or lightheadedness with hypotension: Unknown Has patient had a PCN reaction causing severe rash involving mucus membranes or skin necrosis: Unknown Has patient had a PCN reaction that required hospitalization: No Has  patient had a PCN reaction occurring within the last 10 years: No If all of the above answers are "NO", then may proceed with Cephalosporin use.    Past Medical History:  Past Medical History:  Diagnosis Date  . Allergic state 01/19/2012  . 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 Chicken pox as a child   ?  Marland Kitchen Dysrhythmia as child   HX OF IRREGULAR HB AT TIME OF TONSILLECTOMY - SURGERY WAS NOT DONE-CAN'T REMEMBER ANY OTHER DETAILS- NEVER HAD THE SURGERY.  . Elevated liver function tests 01/19/2012  . Fatty liver   . Hernia 6 months old  . History of diarrhea    better since chemo finished  . Hx of migraines   . Lung abnormality 2015  . Lung nodule 08/2013  . Lung nodule, multiple 11/29/2013  . Migraine headache as a child  . Numbness    TOES OF BOTH FEET.  Marland Kitchen  Overweight(278.02) 01/19/2012  . Pain    LEFT HEEL PAIN -SEVERE ESPECIALLY AFTER SITTING OR LYING DOWN - DIFFICULT TO GET OUT OF BED AND WALK IN THE MORNINGS BECAUSE OF HEEL PAIN  . 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 Preventative health care 01/19/2012  . Radiation 03/20/12-04/27/12   Pelvis 50.4 gray Rectal cancer  . Rectal cancer ([place]) 03/06/12   biopsy-adenocarcinoma  . Reflux 01/19/2012  . Sun-damaged skin 01/19/2012   Past Surgical History:  Past Surgical History:  Procedure Laterality Date  . CHOLECYSTECTOMY N/A 09/17/2013   Procedure: CHOLECYSTECTOMY;  Surgeon: Stark Klein, MD;  Location: [facility];  Service: General;  Laterality: N/A;  . COLOSTOMY TAKEDOWN N/A 08/17/2016   Procedure: LAPAROSCOPIC ABDOMINOPERINEAL RESECTION WITH PERMANENT COLOSTOMY;  Surgeon: Leighton Ruff, MD;  Location: [facility];  Service: General;  Laterality: N/A;  . CYST EXCISION     L EAR AREA  . EUS  03/14/2012   Procedure: LOWER ENDOSCOPIC ULTRASOUND (EUS);  Surgeon: Milus Banister, MD;  Location: [facility];  Service: Endoscopy;  Laterality:  N/A;  . HERNIA REPAIR  6 months old   right inguinal repair  . ILEOSTOMY CLOSURE N/A 12/07/2012   Procedure: ILEOSTOMY TAKEDOWN;  Surgeon: Leighton Ruff, MD;  Location: [facility];  Service: General;  Laterality: N/A;  . LAPAROSCOPIC LOW ANTERIOR RESECTION N/A 06/29/2012   Procedure: LAPAROSCOPIC LOW ANTERIOR RESECTION, mobilization splenic flexure,coloanal anastomosis,diverting ileostomy;  Surgeon: Leighton Ruff, MD;  Location: [facility];  Service: General;  Laterality: N/A;  . LAPAROSCOPY N/A 09/17/2013   Procedure: LAPAROSCOPY DIAGNOSTIC;  Surgeon: Stark Klein, MD;  Location: [facility];  Service: General;  Laterality: N/A;  . LIVER ULTRASOUND N/A 09/17/2013   Procedure: LIVER ULTRASOUND;  Surgeon: Stark Klein, MD;  Location: [facility];  Service: General;  Laterality: N/A;  . MICROLARYNGOSCOPY W/VOCAL CORD INJECTION Left 05/16/2017   Procedure: MICROLARYNGOSCOPY WITH VOCAL CORD INJECTION;  Surgeon: Melissa Montane, MD;  Location: [facility];  Service: ENT;  Laterality: Left;  . OPEN HEPATECTOMY  N/A 09/17/2013   Procedure: OPEN HEPATECTOMY;  Surgeon: Stark Klein, MD;  Location: [facility];  Service: General;  Laterality: N/A;  . PORTACATH PLACEMENT Left 03/21/2013   Procedure: INSERTION PORT-A-CATH;  Surgeon: Leighton Ruff, MD;  Location: [facility];  Service: General;  Laterality: Left;  . RECTAL BIOPSY  03/06/12   distal mass1-2cm from anal verge=Adenocarcinoma   Social History:  Social History   Socioeconomic History  . Marital status: Married    Spouse name: Not on file  . Number of children: 1  . Years of education: Not on file  . Highest education level: Not on file  Occupational History  . Occupation: Midwife: [employer]    Comment: [employer]  Social Needs  . Financial resource strain: Not on file  . Food insecurity:    Worry: Not on file    Inability: Not on file  . Transportation needs:    Medical: Not on file    Non-medical: Not on file  Tobacco Use  . Smoking status:  Never Smoker  . Smokeless tobacco: Never Used  Substance and Sexual Activity  . Alcohol use: Yes    Alcohol/week: 0.0 oz    Comment: RARE   . Drug use: No    Comment: marijuana past  . Sexual activity: Yes    Partners: Female  Lifestyle  . Physical activity:    Days per week: Not on file  Minutes per session: Not on file  . Stress: Not on file  Relationships  . Social connections:    Talks on phone: Not on file    Gets together: Not on file    Attends religious service: Not on file    Active member of club or organization: Not on file    Attends meetings of clubs or organizations: Not on file    Relationship status: Not on file  . Intimate partner violence:    Fear of current or ex partner: Not on file    Emotionally abused: Not on file    Physically abused: Not on file    Forced sexual activity: Not on file  Other Topics Concern  . Not on file  Social History Narrative   Married to wife, [name]   Has one 30 year old child   Occupation: Emergency planning/management officer at [employer]   Family History:  Family History  Adopted: Yes  Problem Relation Age of Onset  . Cancer Father   . Heart disease Father   . Colon cancer Neg Hx     Review of Systems: Constitutional: Denies fevers, chills or abnormal weight loss Eyes: Denies blurriness of vision Ears, nose, mouth, throat, and face: Denies mucositis or sore throat Respiratory: Denies cough, dyspnea or wheezes Cardiovascular: Denies palpitation, chest discomfort or lower extremity swelling Gastrointestinal:  Colostomy GU: Denies dysuria or incontinence Skin: Denies abnormal skin rashes Neurological: +headaches Musculoskeletal: +joint pain, No decrease in ROM Behavioral/Psych: mood instability   Physical Exam: Vitals:   09/27/17 0907  BP: 125/87  Pulse: (!) 107  Resp: 18  Temp: 98.1 F (36.7 C)  SpO2: 100%   KPS: 90. General: Alert, cooperative, pleasant, in no acute distress Head: Normal EENT: No conjunctival  injection or scleral icterus. Oral mucosa moist Lungs: Resp effort normal Cardiac: Regular rate and rhythm Abdomen: Colostomy C/D/I Skin: No rashes cyanosis or petechiae. Extremities: No clubbing or edema  Neurologic Exam: Mental Status: Awake, alert, attentive to examiner.  Restless, agitated, disorganized at times, but oriented to self and environment. Language is fluent with intact comprehension.  Cranial Nerves: Visual acuity is grossly normal. Visual fields are full. Extra-ocular movements intact. No ptosis. Face is symmetric, tongue midline. Motor: Tone and bulk are normal. Power is full in both arms and legs. Reflexes are symmetric, no pathologic reflexes present. Intact finger to nose bilaterally Sensory: Intact to light touch and temperature Gait: Normal and tandem gait is normal.   Labs: I have reviewed the data as listed    Component Value Date/Time   NA 130 (L) 09/15/2017 1128   NA 142 03/24/2017 1009   K 3.2 (L) 09/15/2017 1128   K 4.0 03/24/2017 1009   CL 97 (L) 09/15/2017 1128   CL 108 (H) 08/30/2012 0903   CO2 26 09/15/2017 1128   CO2 27 03/24/2017 1009   GLUCOSE 105 (H) 09/15/2017 1128   GLUCOSE 96 03/24/2017 1009   GLUCOSE 96 08/30/2012 0903   BUN 9 09/15/2017 1128   BUN 12.2 03/24/2017 1009   CREATININE 0.77 09/15/2017 1128   CREATININE 0.7 03/24/2017 1009   CALCIUM 8.5 (L) 09/15/2017 1128   CALCIUM 8.8 03/24/2017 1009   PROT 6.0 (L) 09/15/2017 1128   PROT 6.1 (L) 03/24/2017 1009   ALBUMIN 2.5 (L) 09/15/2017 1128   ALBUMIN 3.6 03/24/2017 1009   AST 60 (H) 09/15/2017 1128   AST 19 03/24/2017 1009   ALT 71 (H) 09/15/2017 1128   ALT 14 03/24/2017 1009  ALKPHOS 92 09/15/2017 1128   ALKPHOS 67 03/24/2017 1009   BILITOT 0.4 09/15/2017 1128   BILITOT <0.22 03/24/2017 1009   GFRNONAA >60 09/15/2017 1128   GFRAA >60 09/15/2017 1128   Lab Results  Component Value Date   WBC 2.9 (L) 09/15/2017   NEUTROABS 2.0 09/15/2017   HGB 9.1 (L) 09/15/2017   HCT  26.5 (L) 09/15/2017   MCV 85.8 09/15/2017   PLT 180 09/15/2017    Imaging:  CLINICAL DATA:  Rectal cancer. Metastatic lesions for treatment planning.  EXAM: MRI HEAD WITHOUT AND WITH CONTRAST  TECHNIQUE: Multiplanar, multiecho pulse sequences of the brain and surrounding structures were obtained without and with intravenous contrast.  CONTRAST:  74mL MULTIHANCE GADOBENATE DIMEGLUMINE 529 MG/ML IV SOLN  COMPARISON:  MRI head 07/28/2017  FINDINGS: Brain: Enhancing metastatic deposits in the brain similar to the recent study. Negative for intracranial hemorrhage  Left occipital lesion 16.5 mm with necrosis and surrounding edema.  Left occipital lesion 18 mm with heterogeneous enhancement and surrounding edema  5 mm left medial parietal lesion with mild edema  6 mm right frontal lesion with mild edema  Ventricle size normal. No midline shift. Negative for acute infarct.  Vascular: Normal arterial flow voids  Skull and upper cervical spine: Negative  Sinuses/Orbits: Negative  Other: None  IMPRESSION: Four metastatic lesions in the brain unchanged from the recent MRI. Moderate edema in the left occipital lobe. No intracranial hemorrhage.   Electronically Signed   By: Franchot Gallo M.D.   On: 08/03/2017 13:21   Assessment/Plan 1. Brain metastasis ([place])  2. Focal seizures [facility])  Mr. Burggraf demonstrates clinical changes consistent dysregulated mood, bordering on mania or hypomania.  There is no clear psychiatric history.  We discussed our feeling that this picture is multifactorial- related to burden of brain cancer, seizures, fast wean from high doses of steroids, subsequent radiation, and keppra therapy which is known to cause dysregulated mood and behavioral issues in some patients.    Recommendation at this time is to convert to mood stabilizing AED regimen.  Because Keppra is potentially causing functional impairment, it should be  discontinued immediately.  We will recommend Lamictal titration- starting with 50mg  daily and increasing to 50mg  BID after one week.  During titration, will bridge with Depakote 500mg  BID.  We discussed how Depakote can alter the metabolism of Lamictal (ie. increase the level), and that there is risk of serious mucosal rash with Lamictal.  He will contact us if rash is developed at the lower dose.  He should return to clinic in 2 weeks to discuss further alterations to this AED regimen.  The plan will be to increase Lamictal further and hopefully discontinue Depakote.  He will call the clinic with any seizures during this transition.  At that time we will also discuss any need for behavioral therapy, as well as plans for future brain imaging.   We spent twenty additional minutes teaching regarding the natural history, biology, and historical experience in the treatment of neurologic complications of cancer. We also provided teaching sheets for the patient to take home as an additional resource.  We appreciate the opportunity to participate in the care of Jacob [name].   All questions were answered. The patient knows to call the clinic with any problems, questions or concerns. No barriers to learning were detected.  The total time spent in the encounter was 40 minutes and more than 50% was on counseling and review of test results  Ventura Sellers, MD Medical Director of Neuro-Oncology [facility] [place] 09/27/17 4:33 PM

## 2017-09-29 ENCOUNTER — Ambulatory Visit: Payer: BC Managed Care – PPO

## 2017-09-29 ENCOUNTER — Ambulatory Visit (INDEPENDENT_AMBULATORY_CARE_PROVIDER_SITE_OTHER): Payer: BC Managed Care – PPO | Admitting: Family Medicine

## 2017-09-29 ENCOUNTER — Other Ambulatory Visit: Payer: BC Managed Care – PPO

## 2017-09-29 ENCOUNTER — Ambulatory Visit: Payer: BC Managed Care – PPO | Admitting: Nurse Practitioner

## 2017-09-29 ENCOUNTER — Telehealth: Payer: Self-pay | Admitting: *Deleted

## 2017-09-29 VITALS — BP 112/72 | HR 91 | Temp 98.3°F | Resp 18 | Wt 137.4 lb

## 2017-09-29 DIAGNOSIS — R05 Cough: Secondary | ICD-10-CM | POA: Diagnosis not present

## 2017-09-29 DIAGNOSIS — D649 Anemia, unspecified: Secondary | ICD-10-CM

## 2017-09-29 DIAGNOSIS — C20 Malignant neoplasm of rectum: Secondary | ICD-10-CM | POA: Diagnosis not present

## 2017-09-29 DIAGNOSIS — R569 Unspecified convulsions: Secondary | ICD-10-CM

## 2017-09-29 DIAGNOSIS — R634 Abnormal weight loss: Secondary | ICD-10-CM

## 2017-09-29 DIAGNOSIS — D72819 Decreased white blood cell count, unspecified: Secondary | ICD-10-CM

## 2017-09-29 DIAGNOSIS — R945 Abnormal results of liver function studies: Secondary | ICD-10-CM

## 2017-09-29 DIAGNOSIS — R059 Cough, unspecified: Secondary | ICD-10-CM

## 2017-09-29 DIAGNOSIS — R7989 Other specified abnormal findings of blood chemistry: Secondary | ICD-10-CM

## 2017-09-29 DIAGNOSIS — E871 Hypo-osmolality and hyponatremia: Secondary | ICD-10-CM | POA: Diagnosis not present

## 2017-09-29 DIAGNOSIS — R252 Cramp and spasm: Secondary | ICD-10-CM

## 2017-09-29 DIAGNOSIS — K219 Gastro-esophageal reflux disease without esophagitis: Secondary | ICD-10-CM | POA: Diagnosis not present

## 2017-09-29 MED ORDER — CEFDINIR 300 MG PO CAPS: 300.0000 mg | ORAL_CAPSULE | Freq: Two times a day (BID) | ORAL | 0 refills | Status: AC

## 2017-09-29 MED ORDER — DRONABINOL 5 MG PO CAPS: 5.0000 mg | ORAL_CAPSULE | Freq: Three times a day (TID) | ORAL | 1 refills | Status: DC | PRN

## 2017-09-29 MED ORDER — TIZANIDINE HCL 4 MG PO TABS: 2.0000 mg | ORAL_TABLET | Freq: Three times a day (TID) | ORAL | 1 refills | Status: DC | PRN

## 2017-09-29 NOTE — Patient Instructions (Addendum)
Yellow split pea powder  Extra protein Muscle milk  Carnation instant breakfast   Hydrate with 64 oz of fluids daily Anemia Anemia is a condition in which you do not have enough red blood cells or hemoglobin. Hemoglobin is a substance in red blood cells that carries oxygen. When you do not have enough red blood cells or hemoglobin (are anemic), your body cannot get enough oxygen and your organs may not work properly. As a result, you may feel very tired or have other problems. What are the causes? Common causes of anemia include:  Excessive bleeding. Anemia can be caused by excessive bleeding inside or outside the body, including bleeding from the intestine or from periods in women.  Poor nutrition.  Long-lasting (chronic) kidney, thyroid, and liver disease.  Bone marrow disorders.  Cancer and treatments for cancer.  HIV (human immunodeficiency virus) and AIDS (acquired immunodeficiency syndrome).  Treatments for HIV and AIDS.  Spleen problems.  Blood disorders.  Infections, medicines, and autoimmune disorders that destroy red blood cells.  What are the signs or symptoms? Symptoms of this condition include:  Minor weakness.  Dizziness.  Headache.  Feeling heartbeats that are irregular or faster than normal (palpitations).  Shortness of breath, especially with exercise.  Paleness.  Cold sensitivity.  Indigestion.  Nausea.  Difficulty sleeping.  Difficulty concentrating.  Symptoms may occur suddenly or develop slowly. If your anemia is mild, you may not have symptoms. How is this diagnosed? This condition is diagnosed based on:  Blood tests.  Your medical history.  A physical exam.  Bone marrow biopsy.  Your health care provider may also check your stool (feces) for blood and may do additional testing to look for the cause of your bleeding. You may also have other tests, including:  Imaging tests, such as a CT scan or  MRI.  Endoscopy.  Colonoscopy.  How is this treated? Treatment for this condition depends on the cause. If you continue to lose a lot of blood, you may need to be treated at a hospital. Treatment may include:  Taking supplements of iron, vitamin U38, or folic acid.  Taking a hormone medicine (erythropoietin) that can help to stimulate red blood cell growth.  Having a blood transfusion. This may be needed if you lose a lot of blood.  Making changes to your diet.  Having surgery to remove your spleen.  Follow these instructions at home:  Take over-the-counter and prescription medicines only as told by your health care provider.  Take supplements only as told by your health care provider.  Follow any diet instructions that you were given.  Keep all follow-up visits as told by your health care provider. This is important. Contact a health care provider if:  You develop new bleeding anywhere in the body. Get help right away if:  You are very weak.  You are short of breath.  You have pain in your abdomen or chest.  You are dizzy or feel faint.  You have trouble concentrating.  You have bloody or black, tarry stools.  You vomit repeatedly or you vomit up blood. Summary  Anemia is a condition in which you do not have enough red blood cells or enough of a substance in your red blood cells that carries oxygen (hemoglobin).  Symptoms may occur suddenly or develop slowly.  If your anemia is mild, you may not have symptoms.  This condition is diagnosed with blood tests as well as a medical history and physical exam. Other tests may  be needed.  Treatment for this condition depends on the cause of the anemia. This information is not intended to replace advice given to you by your health care provider. Make sure you discuss any questions you have with your health care provider. Document Released: 04/15/2004 Document Revised: 04/09/2016 Document Reviewed: 04/09/2016 Elsevier  Interactive Patient Education  [employer].

## 2017-09-29 NOTE — Telephone Encounter (Signed)
[facility] called reqarding Lamotrigine & Depokote interactions.  Reviewed with Dr Mickeal Skinner.  He is fully aware and states that the patient is doing an abrupt stop of Keppra and the Depokote is being used a bridge for the Lamictal and that he has fully explained the risks and side effects to the patient and his spouse and instructed them that this was short term dosing and follow up has to happen within 2 weeks.  [facility] stated that they would fill the Lamictal now and notify the patient that they would fill the Lamictal.

## 2017-09-30 ENCOUNTER — Ambulatory Visit: Payer: BC Managed Care – PPO

## 2017-09-30 ENCOUNTER — Other Ambulatory Visit: Payer: BC Managed Care – PPO

## 2017-09-30 ENCOUNTER — Ambulatory Visit: Payer: BC Managed Care – PPO | Admitting: Nurse Practitioner

## 2017-10-03 ENCOUNTER — Telehealth: Payer: Self-pay

## 2017-10-03 ENCOUNTER — Telehealth: Payer: Self-pay | Admitting: *Deleted

## 2017-10-03 NOTE — Telephone Encounter (Signed)
Patients wife Jacob [name] called questioning if they should be seeing any improvements of being off the Keppra at this point.  Reviewed with Dr Mickeal Skinner and he advised that it has not been enough time and considering all of the factors that could be contributing to his change in behavior improvement can occur but also can be hindered by the changes that have occurred in the brain.  Provided encouragement to the patients wife about giving it more time.  Advised we have follow up scheduled for 07/19 and to stay encouraged until then when Dr Mickeal Skinner can reassess the patient and readdress if any improvements have occurred and make changes accordingly.    Reviewed patients wife for safety measures of herself, her child and the patient.  No factors notified at this time.  Will continue to monitor.

## 2017-10-03 NOTE — Telephone Encounter (Signed)
Received call from patients wife requesting returned call.  Recent medication changes have occurred.  Attempted to return call, no answer, left message

## 2017-10-03 NOTE — Telephone Encounter (Signed)
Called to check in on patient status. Pt states "I've been doing great". This RN informed pt that we wanted to check in on him since his recent medication changes. Pt states "I'm doing great, I've stopped all of them". This Rn voiced understanding. Pt displays mistrust and very brief and vague in conversation. This RN asked pt if he had any questions or concerns for Dr. Benay Spice, pt states "other than the fact that he's been trying to kill me for the last 5 years, no I don't". This RN relayed information to MD and coordinated with RN for Dr. Mickeal Skinner for an additional status update call to pt/family.

## 2017-10-05 DIAGNOSIS — E871 Hypo-osmolality and hyponatremia: Secondary | ICD-10-CM | POA: Insufficient documentation

## 2017-10-05 DIAGNOSIS — R059 Cough, unspecified: Secondary | ICD-10-CM | POA: Insufficient documentation

## 2017-10-05 DIAGNOSIS — R05 Cough: Secondary | ICD-10-CM | POA: Insufficient documentation

## 2017-10-05 NOTE — Assessment & Plan Note (Signed)
stable °

## 2017-10-05 NOTE — Assessment & Plan Note (Signed)
Avoid offending foods, start probiotics. Do not eat large meals in late evening and consider raising head of bed.  

## 2017-10-05 NOTE — Assessment & Plan Note (Signed)
Stable, asymptomatic, will monitor and he has increased electrolyte beverage intake

## 2017-10-05 NOTE — Assessment & Plan Note (Addendum)
He returns to our clinic for the first time in nearly 5 years. 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 He is struggling with weight loss, fatigue, anorexia and is hoping to reestablish care so we can treat his anorexia and resultant weight loss as well as manage other chronic conditions. He is given a prescription for Marinol to improve his po intake and is encouraged to maintain hydration and fiber intake. He is counseled at length for the need to hydrate well and take in Proteins every 4 hours with plenty of fresh fruit and veg. Spent more than 45 minutes face to face with patient reviewing care, examining him and making a medical plan

## 2017-10-05 NOTE — Assessment & Plan Note (Signed)
Doing better on Depakote

## 2017-10-05 NOTE — Assessment & Plan Note (Signed)
Has had a worsening cough productive of dark sputum at times tinged with blood. Start on Omnicef bid and report worsening symptoms

## 2017-10-05 NOTE — Progress Notes (Signed)
Subjective:    Patient ID: Jacob [name], male    DOB: Dec 22, 1970, 47 y.o.   MRN: [id number]  No chief complaint on file.   HPI Patient is in today to reestablish care. He has not been in since 2014 as he has struggled with metastatic rectal cancer under the care of oncology. He has metastses to adrenals, brain, lung. He is weak and has developed seizures which are currently controlled on keppra. He has had his vocal cord paralyzed and struggles with hoarseness. He has sturggled with pneumonia and is currently noting an increasing cough with dark sputum and blood tinged at times. He has started a an electrolyte supplement packet called Hydrate he feels has been helpful. He is planning a trip to the [place] with his wife and daughter this week to go fishing despite his weakness and he feels this is improving some. Denies CP/palp/SOB/HA/fevers/GI or GU c/o. Taking meds as prescribed  Past Medical History:  Diagnosis Date  . Allergic state 01/19/2012  . Cancer ([place]) 03/06/12   rectal bx=Adenocarcinoma  PT HAD RADIATION , CHEMO SURGERY  . Chicken pox as a child   ?  Marland Kitchen Dysrhythmia as child   HX OF IRREGULAR HB AT TIME OF TONSILLECTOMY - SURGERY WAS NOT DONE-CAN'T REMEMBER ANY OTHER DETAILS- NEVER HAD THE SURGERY.  . Elevated liver function tests 01/19/2012  . Fatty liver   . Hernia 6 months old  . History of diarrhea    better since chemo finished  . Hx of migraines   . Lung abnormality 2015  . Lung nodule 08/2013  . Lung nodule, multiple 11/29/2013  . Migraine headache as a child  . Numbness    TOES OF BOTH FEET.  Marland Kitchen Overweight(278.02) 01/19/2012  . Pain    LEFT HEEL PAIN -SEVERE ESPECIALLY AFTER SITTING OR LYING DOWN - DIFFICULT TO GET OUT OF BED AND WALK IN THE MORNINGS BECAUSE OF HEEL PAIN  . 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 Preventative health care 01/19/2012  . Radiation 03/20/12-04/27/12   Pelvis 50.4 gray Rectal cancer  . Rectal  cancer ([place]) 03/06/12   biopsy-adenocarcinoma  . Reflux 01/19/2012  . Sun-damaged skin 01/19/2012    Past Surgical History:  Procedure Laterality Date  . CHOLECYSTECTOMY N/A 09/17/2013   Procedure: CHOLECYSTECTOMY;  Surgeon: Stark Klein, MD;  Location: [place];  Service: General;  Laterality: N/A;  . COLOSTOMY TAKEDOWN N/A 08/17/2016   Procedure: LAPAROSCOPIC ABDOMINOPERINEAL RESECTION WITH PERMANENT COLOSTOMY;  Surgeon: Leighton Ruff, MD;  Location: [facility];  Service: General;  Laterality: N/A;  . CYST EXCISION     L EAR AREA  . EUS  03/14/2012   Procedure: LOWER ENDOSCOPIC ULTRASOUND (EUS);  Surgeon: Milus Banister, MD;  Location: [facility];  Service: Endoscopy;  Laterality: N/A;  . HERNIA REPAIR  6 months old   right inguinal repair  . ILEOSTOMY CLOSURE N/A 12/07/2012   Procedure: ILEOSTOMY TAKEDOWN;  Surgeon: Leighton Ruff, MD;  Location: [facility];  Service: General;  Laterality: N/A;  . LAPAROSCOPIC LOW ANTERIOR RESECTION N/A 06/29/2012   Procedure: LAPAROSCOPIC LOW ANTERIOR RESECTION, mobilization splenic flexure,coloanal anastomosis,diverting ileostomy;  Surgeon: Leighton Ruff, MD;  Location: [facility];  Service: General;  Laterality: N/A;  . LAPAROSCOPY N/A 09/17/2013   Procedure: LAPAROSCOPY DIAGNOSTIC;  Surgeon: Stark Klein, MD;  Location: [place];  Service: General;  Laterality: N/A;  . LIVER ULTRASOUND N/A 09/17/2013   Procedure: LIVER ULTRASOUND;  Surgeon: Stark Klein,  MD;  Location: [place];  Service: General;  Laterality: N/A;  . MICROLARYNGOSCOPY W/VOCAL CORD INJECTION Left 05/16/2017   Procedure: MICROLARYNGOSCOPY WITH VOCAL CORD INJECTION;  Surgeon: Melissa Montane, MD;  Location: [place];  Service: ENT;  Laterality: Left;  . OPEN HEPATECTOMY  N/A 09/17/2013   Procedure: OPEN HEPATECTOMY;  Surgeon: Stark Klein, MD;  Location: [place];  Service: General;  Laterality: N/A;  . PORTACATH PLACEMENT Left 03/21/2013   Procedure: INSERTION PORT-A-CATH;  Surgeon: Leighton Ruff, MD;  Location[facility];  Service: General;  Laterality: Left;  . RECTAL BIOPSY  03/06/12   distal mass1-2cm from anal verge=Adenocarcinoma    Family History  Adopted: Yes  Problem Relation Age of Onset  . Cancer Father   . Heart disease Father   . Colon cancer Neg Hx     Social History   Socioeconomic History  . Marital status: Married    Spouse name: Not on file  . Number of children: 1  . Years of education: Not on file  . Highest education level: Not on file  Occupational History  . Occupation: Midwife: [employer]    Comment: [employer]  Social Needs  . Financial resource strain: Not on file  . Food insecurity:    Worry: Not on file    Inability: Not on file  . Transportation needs:    Medical: Not on file    Non-medical: Not on file  Tobacco Use  . Smoking status: Never Smoker  . Smokeless tobacco: Never Used  Substance and Sexual Activity  . Alcohol use: Yes    Alcohol/week: 0.0 oz    Comment: RARE   . Drug use: No    Comment: marijuana past  . Sexual activity: Yes    Partners: Female  Lifestyle  . Physical activity:    Days per week: Not on file    Minutes per session: Not on file  . Stress: Not on file  Relationships  . Social connections:    Talks on phone: Not on file    Gets together: Not on file    Attends religious service: Not on file    Active member of club or organization: Not on file    Attends meetings of clubs or organizations: Not on file    Relationship status: Not on file  . Intimate partner violence:    Fear of current or ex partner: Not on file    Emotionally abused: Not on file    Physically abused: Not on file    Forced sexual activity: Not on file  Other Topics Concern  . Not on file  Social History Narrative   Married to wife, [name]   Has one 26 year old child   Occupation: Teaches metal sculpture at [employer]    Outpatient Medications Prior to Visit  Medication Sig Dispense Refill  . divalproex (DEPAKOTE)  500 MG DR tablet Take 1 tablet (500 mg total) by mouth 2 (two) times daily. 60 tablet 0  . docusate sodium (STOOL SOFTENER) 100 MG capsule Take 100-200 mg by mouth daily as needed (constipation).     Marland Kitchen levETIRAcetam (KEPPRA) 500 MG tablet Take 1 tablet (500 mg total) by mouth 2 (two) times daily. 60 tablet 0  . ALPRAZolam (XANAX) 0.25 MG tablet Take 1 tablet (0.25 mg total) by mouth at bedtime as needed for anxiety. (Patient not taking: Reported on 09/19/2017) 30 tablet 1  . dronabinol (MARINOL) 2.5 MG capsule Take 1 capsule (  2.5 mg total) by mouth 2 (two) times daily before a meal. 14 capsule 0  . fluconazole (DIFLUCAN) 100 MG tablet Take 1 tablet (100 mg total) by mouth daily. Take 2 tabs x 1, then 1 tab QD x 9 additional days. (Patient not taking: Reported on 09/27/2017) 11 tablet 0  . lamoTRIgine (LAMICTAL) 25 MG tablet Take 2 tablets (50 mg total) by mouth 2 (two) times daily. 120 tablet 0  . lidocaine-prilocaine (EMLA) cream Apply 1 application topically as needed. Apply to port site 1 hour prior to use. 30 g 3  . magic mouthwash SOLN Take 5 mLs by mouth 3 (three) times daily as needed for mouth pain. Swish and spit 5 mLs (Patient not taking: Reported on 09/19/2017) 240 mL 0  . minocycline (MINOCIN,DYNACIN) 100 MG capsule Take 1 capsule (100 mg total) by mouth 2 (two) times daily. 60 capsule 2  . naproxen sodium (ALEVE) 220 MG tablet Take 220 mg by mouth 2 (two) times daily as needed (for headache).     . oxyCODONE (OXY IR/ROXICODONE) 5 MG immediate release tablet Take 1 tablet (5 mg total) by mouth every 6 (six) hours as needed for severe pain. (Patient not taking: Reported on 09/15/2017) 30 tablet 0  . prochlorperazine (COMPAZINE) 10 MG tablet Take 1 tablet (10 mg total) by mouth every 6 (six) hours as needed for nausea or vomiting. (Patient not taking: Reported on 09/15/2017) 30 tablet 0   Facility-Administered Medications Prior to Visit  Medication Dose Route Frequency Provider Last Rate Last Dose    . atropine injection 0.5 mg  0.5 mg Intravenous Once PRN Betsy Coder B, MD      . atropine injection 0.5 mg  0.5 mg Intravenous Once PRN Ladell Pier, MD      . clindamycin (CLEOCIN) 900 mg in dextrose 5 % 50 mL IVPB  900 mg Intravenous 60 min Pre-Op Leighton Ruff, MD      . gentamicin (GARAMYCIN) 340 mg in dextrose 5 % 50 mL IVPB  5 mg/kg Intravenous 60 min Pre-Op Leighton Ruff, MD        Allergies  Allergen Reactions  . Alprazolam Other (See Comments)    Excessive sedation  . Penicillins Rash    Childhood allergy Has patient had a PCN reaction causing immediate rash, facial/tongue/throat swelling, SOB or lightheadedness with hypotension: Unknown Has patient had a PCN reaction causing severe rash involving mucus membranes or skin necrosis: Unknown Has patient had a PCN reaction that required hospitalization: No Has patient had a PCN reaction occurring within the last 10 years: No If all of the above answers are "NO", then may proceed with Cephalosporin use.     Review of Systems  Constitutional: Positive for malaise/fatigue and weight loss. Negative for fever.  HENT: Positive for congestion.   Eyes: Negative for blurred vision.  Respiratory: Positive for cough, hemoptysis and sputum production. Negative for shortness of breath.   Cardiovascular: Negative for chest pain, palpitations and leg swelling.  Gastrointestinal: Negative for abdominal pain, blood in stool and nausea.  Genitourinary: Negative for dysuria and frequency.  Musculoskeletal: Positive for joint pain. Negative for falls.  Skin: Negative for rash.  Neurological: Positive for seizures and weakness. Negative for dizziness, loss of consciousness and headaches.  Endo/Heme/Allergies: Negative for environmental allergies.  Psychiatric/Behavioral: Negative for depression. The patient is not nervous/anxious.        Objective:    Physical Exam  Constitutional: He is oriented to person, place, and time. No  distress.  Cachectic and frail  HENT:  Head: Normocephalic and atraumatic.  Nose: Nose normal.  Oropharynx erythematous.   Eyes: Right eye exhibits no discharge. Left eye exhibits no discharge.  Neck: Normal range of motion. Neck supple.  Cardiovascular: Normal rate and regular rhythm.  No murmur heard. Pulmonary/Chest: Effort normal and breath sounds normal.  Abdominal: Soft. Bowel sounds are normal. There is no tenderness.  Musculoskeletal: He exhibits no edema.  Neurological: He is alert and oriented to person, place, and time.  Skin: Skin is warm and dry.  Psychiatric: He has a normal mood and affect.  Nursing note and vitals reviewed.   BP 112/72 (BP Location: Left Arm, Patient Position: Sitting, Cuff Size: Normal)   Pulse 91   Temp 98.3 F (36.8 C) (Oral)   Resp 18   Wt 137 lb 6.4 oz (62.3 kg)   SpO2 99%   BMI 19.71 kg/m  Wt Readings from Last 3 Encounters:  09/29/17 137 lb 6.4 oz (62.3 kg)  09/27/17 136 lb 4.8 oz (61.8 kg)  09/19/17 137 lb (62.1 kg)     Lab Results  Component Value Date   WBC 2.9 (L) 09/15/2017   HGB 9.1 (L) 09/15/2017   HCT 26.5 (L) 09/15/2017   PLT 180 09/15/2017   GLUCOSE 105 (H) 09/15/2017   ALT 71 (H) 09/15/2017   AST 60 (H) 09/15/2017   NA 130 (L) 09/15/2017   K 3.2 (L) 09/15/2017   CL 97 (L) 09/15/2017   CREATININE 0.77 09/15/2017   BUN 9 09/15/2017   CO2 26 09/15/2017   TSH 1.549 12/28/2012   INR 1.05 11/20/2014   HGBA1C 5.4 08/11/2016    Lab Results  Component Value Date   TSH 1.549 12/28/2012   Lab Results  Component Value Date   WBC 2.9 (L) 09/15/2017   HGB 9.1 (L) 09/15/2017   HCT 26.5 (L) 09/15/2017   MCV 85.8 09/15/2017   PLT 180 09/15/2017   Lab Results  Component Value Date   NA 130 (L) 09/15/2017   K 3.2 (L) 09/15/2017   CHLORIDE 106 03/24/2017   CO2 26 09/15/2017   GLUCOSE 105 (H) 09/15/2017   BUN 9 09/15/2017   CREATININE 0.77 09/15/2017   BILITOT 0.4 09/15/2017   ALKPHOS 92 09/15/2017   AST 60  (H) 09/15/2017   ALT 71 (H) 09/15/2017   PROT 6.0 (L) 09/15/2017   ALBUMIN 2.5 (L) 09/15/2017   CALCIUM 8.5 (L) 09/15/2017   ANIONGAP 7 09/15/2017   EGFR >60 03/24/2017   GFR 102.75 01/25/2012   No results found for: CHOL No results found for: HDL No results found for: LDLCALC No results found for: TRIG No results found for: CHOLHDL Lab Results  Component Value Date   HGBA1C 5.4 08/11/2016       Assessment & Plan:   Problem List Items Addressed This Visit    Elevated liver function tests - Primary   GERD (gastroesophageal reflux disease)    Avoid offending foods, start probiotics. Do not eat large meals in late evening and consider raising head of bed.       Relevant Medications   dronabinol (MARINOL) 5 MG capsule   Malignant neoplasm of rectum [facility])    He returns to our clinic for the first time in nearly 5 years. 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 He is struggling with weight loss, fatigue, anorexia and is hoping to reestablish care so  we can treat his anorexia and resultant weight loss as well as manage other chronic conditions. He is given a prescription for Marinol to improve his po intake and is encouraged to maintain hydration and fiber intake. He is counseled at length for the need to hydrate well and take in Proteins every 4 hours with plenty of fresh fruit and veg. Spent more than 45 minutes face to face with patient reviewing care, examining him and making a medical plan      Relevant Medications   cefdinir (OMNICEF) 300 MG capsule   Anemia   Leukopenia    stable      Focal seizures (HCC)    Doing better on Depakote      Hyponatremia    Stable, asymptomatic, will monitor and he has increased electrolyte beverage intake      Cough    Has had a worsening cough productive of dark sputum at times tinged with blood. Start on Omnicef bid and report worsening symptoms         Other Visit Diagnoses    Muscle cramps       Weight loss       Relevant Orders   TSH   T4, free      I have discontinued Oleg R. Carlisi's naproxen sodium, lidocaine-prilocaine, prochlorperazine, minocycline, ALPRAZolam, oxyCODONE, dronabinol, magic mouthwash, fluconazole, and lamoTRIgine. I am also having him start on tiZANidine, cefdinir, and dronabinol. Additionally, I am having him maintain his docusate sodium, levETIRAcetam, and divalproex. We will stop administering atropine, clindamycin (CLEOCIN) 900 mg in dextrose 5 % 50 mL IVPB, and gentamicin (GARAMYCIN) 340 mg in dextrose 5 % 50 mL IVPB.  Meds ordered this encounter  Medications  . tiZANidine (ZANAFLEX) 4 MG tablet    Sig: Take 0.5-1 tablets (2-4 mg total) by mouth every 8 (eight) hours as needed for muscle spasms.    Dispense:  30 tablet    Refill:  1  . cefdinir (OMNICEF) 300 MG capsule    Sig: Take 1 capsule (300 mg total) by mouth 2 (two) times daily for 10 days.    Dispense:  20 capsule    Refill:  0  . dronabinol (MARINOL) 5 MG capsule    Sig: Take 1 capsule (5 mg total) by mouth 3 (three) times daily as needed.    Dispense:  90 capsule    Refill:  1    CMA served as scribe during this visit. History, Physical and Plan performed by medical provider. Documentation and orders reviewed and attested to.  Penni Homans, MD

## 2017-10-07 ENCOUNTER — Encounter: Payer: Self-pay | Admitting: Internal Medicine

## 2017-10-07 ENCOUNTER — Inpatient Hospital Stay (HOSPITAL_BASED_OUTPATIENT_CLINIC_OR_DEPARTMENT_OTHER): Payer: BC Managed Care – PPO | Admitting: Internal Medicine

## 2017-10-07 ENCOUNTER — Telehealth: Payer: Self-pay | Admitting: Internal Medicine

## 2017-10-07 VITALS — BP 120/79 | HR 96 | Temp 98.2°F | Resp 18 | Ht 70.0 in | Wt 151.0 lb

## 2017-10-07 DIAGNOSIS — R569 Unspecified convulsions: Secondary | ICD-10-CM | POA: Diagnosis not present

## 2017-10-07 DIAGNOSIS — R609 Edema, unspecified: Secondary | ICD-10-CM | POA: Diagnosis not present

## 2017-10-07 DIAGNOSIS — C7931 Secondary malignant neoplasm of brain: Secondary | ICD-10-CM | POA: Diagnosis not present

## 2017-10-07 MED ORDER — LAMOTRIGINE 100 MG PO TABS: 100.0000 mg | ORAL_TABLET | Freq: Two times a day (BID) | ORAL | 3 refills | Status: DC

## 2017-10-07 NOTE — Progress Notes (Signed)
[facility] [place], [place] [address] [phone]   Interval Evaluation  Date of Service: 10/07/17 Patient Name: Jacob [name] Patient MRN: [id number] Patient DOB: 1970/10/17 Provider: Ventura Sellers, MD  Identifying Statement:  Jacob [name] is a 47 y.o. 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[facility])      03/20/2012 - 04/27/2012 Radiation Therapy    RT for 28 Fractions w / Xeloda       04/30/2015 -  Chemotherapy    The patient had palonosetron (ALOXI) injection 0.25 mg, 0.25 mg, Intravenous,  Once, 25 of 27 cycles Administration: 0.25 mg (05/21/2015), 0.25 mg (06/05/2015), 0.25 mg (06/19/2015), 0.25 mg (07/10/2015), 0.25 mg (07/24/2015), 0.25 mg (08/28/2015), 0.25 mg (09/11/2015), 0.25 mg (09/25/2015), 0.25 mg (10/09/2015), 0.25 mg (10/23/2015), 0.25 mg (11/04/2016), 0.25 mg (11/18/2016), 0.25 mg (12/02/2016), 0.25 mg (12/15/2016), 0.25 mg (12/30/2016), 0.25 mg (01/13/2017), 0.25 mg (01/27/2017), 0.25 mg (02/17/2017), 0.25 mg (03/03/2017), 0.25 mg (03/24/2017), 0.25 mg (04/07/2017), 0.25 mg (04/21/2017), 0.25 mg (05/05/2017), 0.25 mg (08/19/2017), 0.25 mg (09/01/2017) pegfilgrastim (NEULASTA) injection 6 mg, 6 mg, Subcutaneous, Once, 23 of 23 cycles Administration: 6 mg (05/23/2015), 6 mg (06/07/2015), 6 mg (06/21/2015), 6 mg (07/12/2015), 6 mg (07/26/2015), 6 mg (08/30/2015), 6 mg (09/13/2015), 6 mg (09/27/2015), 6 mg (10/11/2015), 6 mg (10/25/2015), 6 mg (11/06/2016), 6 mg (11/20/2016), 6 mg (12/04/2016), 6 mg (12/17/2016), 6 mg (01/01/2017), 6 mg (01/15/2017), 6 mg (01/29/2017), 6 mg (02/19/2017), 6 mg (03/05/2017), 6 mg (03/26/2017), 6 mg (04/09/2017), 6 mg (04/23/2017), 6 mg (05/07/2017) pegfilgrastim-cbqv (UDENYCA) injection 6 mg, 6 mg, Subcutaneous, Once, 1 of 1 cycle Administration: 6 mg (08/22/2017) irinotecan  (CAMPTOSAR) 258 mg in dextrose 5 % 500 mL chemo infusion, 135 mg/m2 = 258 mg (75 % of original dose 180 mg/m2), Intravenous,  Once, 25 of 27 cycles Dose modification: 135 mg/m2 (original dose 180 mg/m2, Cycle 1, Reason: Provider Judgment), 100 mg/m2 (original dose 180 mg/m2, Cycle 24, Reason: Provider Judgment) Administration: 258 mg (05/21/2015), 260 mg (06/05/2015), 258 mg (06/19/2015), 258 mg (07/10/2015), 260 mg (07/24/2015), 260 mg (08/28/2015), 260 mg (09/11/2015), 260 mg (09/25/2015), 260 mg (10/23/2015), 260 mg (11/04/2016), 240 mg (11/18/2016), 240 mg (12/02/2016), 240 mg (12/15/2016), 240 mg (12/30/2016), 240 mg (01/13/2017), 240 mg (01/27/2017), 240 mg (02/17/2017), 240 mg (03/03/2017), 240 mg (03/24/2017), 240 mg (04/07/2017), 240 mg (04/21/2017), 240 mg (05/05/2017), 180 mg (08/19/2017), 180 mg (09/01/2017) leucovorin 764 mg in dextrose 5 % 250 mL infusion, 400 mg/m2 = 764 mg, Intravenous,  Once, 25 of 27 cycles Administration: 764 mg (05/21/2015), 764 mg (06/05/2015), 764 mg (06/19/2015), 764 mg (07/10/2015), 760 mg (07/24/2015), 760 mg (08/28/2015), 760 mg (09/11/2015), 760 mg (09/25/2015), 764 mg (10/09/2015), 764 mg (10/23/2015), 764 mg (11/04/2016), 764 mg (11/18/2016), 764 mg (12/02/2016), 764 mg (12/15/2016), 716 mg (12/30/2016), 716 mg (01/13/2017), 716 mg (01/27/2017), 716 mg (02/17/2017), 716 mg (03/03/2017), 716 mg (03/24/2017), 716 mg (04/07/2017), 716 mg (04/21/2017), 716 mg (05/05/2017), 736 mg (08/19/2017), 736 mg (09/01/2017) fluorouracil (ADRUCIL) chemo injection 750 mg, 400 mg/m2 = 750 mg, Intravenous,  Once, 10 of 10 cycles Administration: 750 mg (05/21/2015), 750 mg (06/05/2015), 750 mg (06/19/2015), 750 mg (07/10/2015), 750 mg (07/24/2015), 750 mg (08/28/2015), 750 mg (09/11/2015), 750 mg (09/25/2015), 750 mg (10/09/2015), 750 mg (10/23/2015) leucovorin injection 38 mg, 20 mg/m2 = 38  mg, Intravenous,  Once, 1 of 1 cycle fosaprepitant (EMEND) 150 mg, dexamethasone (DECADRON) 12 mg in sodium chloride 0.9 % 145 mL IVPB, , Intravenous,  Once, 25  of 27 cycles Administration:  (05/21/2015),  (06/05/2015),  (06/19/2015),  (07/10/2015),  (07/24/2015),  (08/28/2015),  (09/11/2015),  (09/25/2015),  (10/09/2015),  (10/23/2015),  (11/04/2016),  (11/18/2016),  (12/02/2016),  (12/15/2016),  (12/30/2016),  (01/13/2017),  (01/27/2017),  (02/17/2017),  (03/03/2017),  (03/24/2017),  (04/07/2017),  (04/21/2017),  (05/05/2017),  (08/19/2017),  (09/01/2017) fluorouracil (ADRUCIL) 4,600 mg in sodium chloride 0.9 % 58 mL chemo infusion, 2,400 mg/m2 = 4,600 mg, Intravenous, 1 Day/Dose, 25 of 27 cycles Administration: 4,600 mg (05/21/2015), 4,600 mg (06/05/2015), 4,600 mg (06/19/2015), 4,600 mg (07/10/2015), 4,600 mg (07/24/2015), 4,600 mg (08/28/2015), 4,600 mg (09/11/2015), 4,600 mg (09/25/2015), 4,600 mg (10/09/2015), 4,600 mg (10/23/2015), 4,600 mg (11/04/2016), 4,600 mg (11/18/2016), 4,600 mg (12/02/2016), 4,600 mg (12/15/2016), 4,300 mg (12/30/2016), 4,300 mg (01/13/2017), 4,300 mg (01/27/2017), 4,300 mg (02/17/2017), 4,300 mg (03/03/2017), 4,300 mg (03/24/2017), 4,300 mg (04/07/2017), 4,300 mg (04/21/2017), 4,300 mg (05/05/2017), 4,300 mg (08/19/2017), 4,400 mg (09/01/2017)  for chemotherapy treatment.        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[facility])      04/30/2015 -  Chemotherapy    The patient had palonosetron (ALOXI) injection 0.25 mg, 0.25 mg, Intravenous,  Once, 25 of 27 cycles Administration: 0.25 mg (05/21/2015), 0.25 mg (06/05/2015), 0.25 mg (06/19/2015), 0.25 mg (07/10/2015), 0.25 mg (07/24/2015), 0.25 mg (08/28/2015), 0.25 mg (09/11/2015), 0.25 mg (09/25/2015), 0.25 mg (10/09/2015), 0.25 mg (10/23/2015), 0.25 mg (11/04/2016), 0.25 mg (11/18/2016), 0.25 mg (12/02/2016), 0.25 mg (12/15/2016), 0.25 mg (12/30/2016), 0.25 mg (01/13/2017), 0.25 mg (01/27/2017), 0.25 mg (02/17/2017), 0.25 mg (03/03/2017), 0.25 mg (03/24/2017), 0.25 mg (04/07/2017), 0.25 mg (04/21/2017), 0.25 mg (05/05/2017), 0.25 mg (08/19/2017), 0.25 mg (09/01/2017) pegfilgrastim (NEULASTA)  injection 6 mg, 6 mg, Subcutaneous, Once, 23 of 23 cycles Administration: 6 mg (05/23/2015), 6 mg (06/07/2015), 6 mg (06/21/2015), 6 mg (07/12/2015), 6 mg (07/26/2015), 6 mg (08/30/2015), 6 mg (09/13/2015), 6 mg (09/27/2015), 6 mg (10/11/2015), 6 mg (10/25/2015), 6 mg (11/06/2016), 6 mg (11/20/2016), 6 mg (12/04/2016), 6 mg (12/17/2016), 6 mg (01/01/2017), 6 mg (01/15/2017), 6 mg (01/29/2017), 6 mg (02/19/2017), 6 mg (03/05/2017), 6 mg (03/26/2017), 6 mg (04/09/2017), 6 mg (04/23/2017), 6 mg (05/07/2017) pegfilgrastim-cbqv (UDENYCA) injection 6 mg, 6 mg, Subcutaneous, Once, 1 of 1 cycle Administration: 6 mg (08/22/2017) irinotecan (CAMPTOSAR) 258 mg in dextrose 5 % 500 mL chemo infusion, 135 mg/m2 = 258 mg (75 % of original dose 180 mg/m2), Intravenous,  Once, 25 of 27 cycles Dose modification: 135 mg/m2 (original dose 180 mg/m2, Cycle 1, Reason: Provider Judgment), 100 mg/m2 (original dose 180 mg/m2, Cycle 24, Reason: Provider Judgment) Administration: 258 mg (05/21/2015), 260 mg (06/05/2015), 258 mg (06/19/2015), 258 mg (07/10/2015), 260 mg (07/24/2015), 260 mg (08/28/2015), 260 mg (09/11/2015), 260 mg (09/25/2015), 260 mg (10/23/2015), 260 mg (11/04/2016), 240 mg (11/18/2016), 240 mg (12/02/2016), 240 mg (12/15/2016), 240 mg (12/30/2016), 240 mg (01/13/2017), 240 mg (01/27/2017), 240 mg (02/17/2017), 240 mg (03/03/2017), 240 mg (03/24/2017), 240 mg (04/07/2017), 240 mg (04/21/2017), 240 mg (05/05/2017), 180 mg (08/19/2017), 180 mg (09/01/2017) leucovorin 764 mg in dextrose 5 % 250 mL infusion, 400 mg/m2 = 764 mg, Intravenous,  Once, 25 of 27 cycles Administration: 764 mg (05/21/2015), 764 mg (06/05/2015), 764 mg (06/19/2015), 764 mg (07/10/2015), 760 mg (07/24/2015), 760 mg (08/28/2015), 760 mg (09/11/2015), 760 mg (09/25/2015), 764 mg (10/09/2015),  764 mg (10/23/2015), 764 mg (11/04/2016), 764 mg (11/18/2016), 764 mg (12/02/2016), 764 mg (12/15/2016), 716 mg (12/30/2016), 716 mg (01/13/2017), 716 mg (01/27/2017), 716 mg (02/17/2017), 716 mg (03/03/2017), 716 mg (03/24/2017), 716  mg (04/07/2017), 716 mg (04/21/2017), 716 mg (05/05/2017), 736 mg (08/19/2017), 736 mg (09/01/2017) fluorouracil (ADRUCIL) chemo injection 750 mg, 400 mg/m2 = 750 mg, Intravenous,  Once, 10 of 10 cycles Administration: 750 mg (05/21/2015), 750 mg (06/05/2015), 750 mg (06/19/2015), 750 mg (07/10/2015), 750 mg (07/24/2015), 750 mg (08/28/2015), 750 mg (09/11/2015), 750 mg (09/25/2015), 750 mg (10/09/2015), 750 mg (10/23/2015) leucovorin injection 38 mg, 20 mg/m2 = 38 mg, Intravenous,  Once, 1 of 1 cycle fosaprepitant (EMEND) 150 mg, dexamethasone (DECADRON) 12 mg in sodium chloride 0.9 % 145 mL IVPB, , Intravenous,  Once, 25 of 27 cycles Administration:  (05/21/2015),  (06/05/2015),  (06/19/2015),  (07/10/2015),  (07/24/2015),  (08/28/2015),  (09/11/2015),  (09/25/2015),  (10/09/2015),  (10/23/2015),  (11/04/2016),  (11/18/2016),  (12/02/2016),  (12/15/2016),  (12/30/2016),  (01/13/2017),  (01/27/2017),  (02/17/2017),  (03/03/2017),  (03/24/2017),  (04/07/2017),  (04/21/2017),  (05/05/2017),  (08/19/2017),  (09/01/2017) fluorouracil (ADRUCIL) 4,600 mg in sodium chloride 0.9 % 58 mL chemo infusion, 2,400 mg/m2 = 4,600 mg, Intravenous, 1 Day/Dose, 25 of 27 cycles Administration: 4,600 mg (05/21/2015), 4,600 mg (06/05/2015), 4,600 mg (06/19/2015), 4,600 mg (07/10/2015), 4,600 mg (07/24/2015), 4,600 mg (08/28/2015), 4,600 mg (09/11/2015), 4,600 mg (09/25/2015), 4,600 mg (10/09/2015), 4,600 mg (10/23/2015), 4,600 mg (11/04/2016), 4,600 mg (11/18/2016), 4,600 mg (12/02/2016), 4,600 mg (12/15/2016), 4,300 mg (12/30/2016), 4,300 mg (01/13/2017), 4,300 mg (01/27/2017), 4,300 mg (02/17/2017), 4,300 mg (03/03/2017), 4,300 mg (03/24/2017), 4,300 mg (04/07/2017), 4,300 mg (04/21/2017), 4,300 mg (05/05/2017), 4,300 mg (08/19/2017), 4,400 mg (09/01/2017)  for chemotherapy treatment.        Interim History:  Jacob [name] presents for re-evaluation after abrupt changes were made to his anti-seizure medication regimen 2 weeks ago.  The behvaioral outbursts, disorganized and paranoid behavior has  dramatically improved.  He is closer now to being "back to himself" per patient and his wife.  He does describe some increase in lower leg edema since starting the depakote and lamictal.   Additionally, he describes prior episodes of involuntary "painful clenching/contractions" of fists and arms starting a few months ago, occurring less than daily, now less frequent but still occasionally occurring.  Otherwise no seizures as prior.  Medications: Current Outpatient Medications on File Prior to Visit  Medication Sig Dispense Refill  . cefdinir (OMNICEF) 300 MG capsule Take 1 capsule (300 mg total) by mouth 2 (two) times daily for 10 days. 20 capsule 0  . divalproex (DEPAKOTE) 500 MG DR tablet Take 1 tablet (500 mg total) by mouth 2 (two) times daily. 60 tablet 0  . docusate sodium (STOOL SOFTENER) 100 MG capsule Take 100-200 mg by mouth daily as needed (constipation).     Marland Kitchen dronabinol (MARINOL) 5 MG capsule Take 1 capsule (5 mg total) by mouth 3 (three) times daily as needed. 90 capsule 1  . tiZANidine (ZANAFLEX) 4 MG tablet Take 0.5-1 tablets (2-4 mg total) by mouth every 8 (eight) hours as needed for muscle spasms. (Patient not taking: Reported on 10/07/2017) 30 tablet 1   Current Facility-Administered Medications on File Prior to Visit  Medication Dose Route Frequency Provider Last Rate Last Dose  . atropine injection 0.5 mg  0.5 mg Intravenous Once PRN Ladell Pier, MD        Allergies:  Allergies  Allergen Reactions  . Alprazolam Other (See Comments)  Excessive sedation  . Penicillins Rash    Childhood allergy Has patient had a PCN reaction causing immediate rash, facial/tongue/throat swelling, SOB or lightheadedness with hypotension: Unknown Has patient had a PCN reaction causing severe rash involving mucus membranes or skin necrosis: Unknown Has patient had a PCN reaction that required hospitalization: No Has patient had a PCN reaction occurring within the last 10 years: No If  all of the above answers are "NO", then may proceed with Cephalosporin use.    Past Medical History:  Past Medical History:  Diagnosis Date  . Allergic state 01/19/2012  . 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 Chicken pox as a child   ?  Marland Kitchen Dysrhythmia as child   HX OF IRREGULAR HB AT TIME OF TONSILLECTOMY - SURGERY WAS NOT DONE-CAN'T REMEMBER ANY OTHER DETAILS- NEVER HAD THE SURGERY.  . Elevated liver function tests 01/19/2012  . Fatty liver   . Hernia 6 months old  . History of diarrhea    better since chemo finished  . Hx of migraines   . Lung abnormality 2015  . Lung nodule 08/2013  . Lung nodule, multiple 11/29/2013  . Migraine headache as a child  . Numbness    TOES OF BOTH FEET.  Marland Kitchen Overweight(278.02) 01/19/2012  . Pain    LEFT HEEL PAIN -SEVERE ESPECIALLY AFTER SITTING OR LYING DOWN - DIFFICULT TO GET OUT OF BED AND WALK IN THE MORNINGS BECAUSE OF HEEL PAIN  . 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 Preventative health care 01/19/2012  . Radiation 03/20/12-04/27/12   Pelvis 50.4 gray Rectal cancer  . Rectal cancer ([facility]) 03/06/12   biopsy-adenocarcinoma  . Reflux 01/19/2012  . Sun-damaged skin 01/19/2012   Past Surgical History:  Past Surgical History:  Procedure Laterality Date  . CHOLECYSTECTOMY N/A 09/17/2013   Procedure: CHOLECYSTECTOMY;  Surgeon: Stark Klein, MD;  Location: [facility];  Service: General;  Laterality: N/A;  . COLOSTOMY TAKEDOWN N/A 08/17/2016   Procedure: LAPAROSCOPIC ABDOMINOPERINEAL RESECTION WITH PERMANENT COLOSTOMY;  Surgeon: Leighton Ruff, MD;  Location: [facility];  Service: General;  Laterality: N/A;  . CYST EXCISION     L EAR AREA  . EUS  03/14/2012   Procedure: LOWER ENDOSCOPIC ULTRASOUND (EUS);  Surgeon: Milus Banister, MD;  Location: [facility];  Service: Endoscopy;  Laterality: N/A;  . HERNIA REPAIR  6 months old   right inguinal repair  .  ILEOSTOMY CLOSURE N/A 12/07/2012   Procedure: ILEOSTOMY TAKEDOWN;  Surgeon: Leighton Ruff, MD;  Location: [facility];  Service: General;  Laterality: N/A;  . LAPAROSCOPIC LOW ANTERIOR RESECTION N/A 06/29/2012   Procedure: LAPAROSCOPIC LOW ANTERIOR RESECTION, mobilization splenic flexure,coloanal anastomosis,diverting ileostomy;  Surgeon: Leighton Ruff, MD;  Location: [facility];  Service: General;  Laterality: N/A;  . LAPAROSCOPY N/A 09/17/2013   Procedure: LAPAROSCOPY DIAGNOSTIC;  Surgeon: Stark Klein, MD;  Location: [facility];  Service: General;  Laterality: N/A;  . LIVER ULTRASOUND N/A 09/17/2013   Procedure: LIVER ULTRASOUND;  Surgeon: Stark Klein, MD;  Location: [facility];  Service: General;  Laterality: N/A;  . MICROLARYNGOSCOPY W/VOCAL CORD INJECTION Left 05/16/2017   Procedure: MICROLARYNGOSCOPY WITH VOCAL CORD INJECTION;  Surgeon: Melissa Montane, MD;  Location: [facility];  Service: ENT;  Laterality: Left;  . OPEN HEPATECTOMY  N/A 09/17/2013   Procedure: OPEN HEPATECTOMY;  Surgeon: Stark Klein, MD;  Location: [facility];  Service: General;  Laterality:  N/A;  . PORTACATH PLACEMENT Left 03/21/2013   Procedure: INSERTION PORT-A-CATH;  Surgeon: Leighton Ruff, MD;  Location: [facility];  Service: General;  Laterality: Left;  . RECTAL BIOPSY  03/06/12   distal mass1-2cm from anal verge=Adenocarcinoma   Social History:  Social History   Socioeconomic History  . Marital status: Married    Spouse name: Not on file  . Number of children: 1  . Years of education: Not on file  . Highest education level: Not on file  Occupational History  . Occupation: Midwife: [employer]    Comment: [employer]  Social Needs  . Financial resource strain: Not on file  . Food insecurity:    Worry: Not on file    Inability: Not on file  . Transportation needs:    Medical: Not on file    Non-medical: Not on file  Tobacco Use  . Smoking status: Never Smoker  . Smokeless tobacco: Never Used  Substance and  Sexual Activity  . Alcohol use: Yes    Alcohol/week: 0.0 oz    Comment: RARE   . Drug use: No    Comment: marijuana past  . Sexual activity: Yes    Partners: Female  Lifestyle  . Physical activity:    Days per week: Not on file    Minutes per session: Not on file  . Stress: Not on file  Relationships  . Social connections:    Talks on phone: Not on file    Gets together: Not on file    Attends religious service: Not on file    Active member of club or organization: Not on file    Attends meetings of clubs or organizations: Not on file    Relationship status: Not on file  . Intimate partner violence:    Fear of current or ex partner: Not on file    Emotionally abused: Not on file    Physically abused: Not on file    Forced sexual activity: Not on file  Other Topics Concern  . Not on file  Social History Narrative   Married to wife, [name]   Has one 26 year old child   Occupation: Emergency planning/management officer at [employer]   Family History:  Family History  Adopted: Yes  Problem Relation Age of Onset  . Cancer Father   . Heart disease Father   . Colon cancer Neg Hx     Review of Systems: Constitutional: Denies fevers, chills or abnormal weight loss Eyes: Denies blurriness of vision Ears, nose, mouth, throat, and face: Denies mucositis or sore throat Respiratory: Denies cough, dyspnea or wheezes Cardiovascular: Denies palpitation, chest discomfort or lower extremity swelling Gastrointestinal:  Colostomy GU: Denies dysuria or incontinence Skin: Denies abnormal skin rashes Neurological: +headaches Musculoskeletal: +joint pain, No decrease in ROM Behavioral/Psych: mood instability   Physical Exam: Vitals:   10/07/17 0915  BP: 120/79  Pulse: 96  Resp: 18  Temp: 98.2 F (36.8 C)  SpO2: 100%   KPS: 90. General: Alert, cooperative, pleasant, in no acute distress Head: Normal EENT: No conjunctival injection or scleral icterus. Oral mucosa moist Lungs: Resp effort  normal Cardiac: Regular rate and rhythm Abdomen: Colostomy C/D/I Skin: No rashes cyanosis or petechiae. Extremities: No clubbing or edema  Neurologic Exam: Mental Status: Awake, alert, attentive to examiner. Language is fluent with intact comprehension.  Cranial Nerves: Visual acuity is grossly normal. Visual fields are full. Extra-ocular movements intact. No ptosis. Face is symmetric, tongue midline. Motor: Tone and  bulk are normal. Power is full in both arms and legs. Reflexes are symmetric, no pathologic reflexes present. Intact finger to nose bilaterally Sensory: Intact to light touch and temperature Gait: Normal and tandem gait is normal.   Labs: I have reviewed the data as listed    Component Value Date/Time   NA 130 (L) 09/15/2017 1128   NA 142 03/24/2017 1009   K 3.2 (L) 09/15/2017 1128   K 4.0 03/24/2017 1009   CL 97 (L) 09/15/2017 1128   CL 108 (H) 08/30/2012 0903   CO2 26 09/15/2017 1128   CO2 27 03/24/2017 1009   GLUCOSE 105 (H) 09/15/2017 1128   GLUCOSE 96 03/24/2017 1009   GLUCOSE 96 08/30/2012 0903   BUN 9 09/15/2017 1128   BUN 12.2 03/24/2017 1009   CREATININE 0.77 09/15/2017 1128   CREATININE 0.7 03/24/2017 1009   CALCIUM 8.5 (L) 09/15/2017 1128   CALCIUM 8.8 03/24/2017 1009   PROT 6.0 (L) 09/15/2017 1128   PROT 6.1 (L) 03/24/2017 1009   ALBUMIN 2.5 (L) 09/15/2017 1128   ALBUMIN 3.6 03/24/2017 1009   AST 60 (H) 09/15/2017 1128   AST 19 03/24/2017 1009   ALT 71 (H) 09/15/2017 1128   ALT 14 03/24/2017 1009   ALKPHOS 92 09/15/2017 1128   ALKPHOS 67 03/24/2017 1009   BILITOT 0.4 09/15/2017 1128   BILITOT <0.22 03/24/2017 1009   GFRNONAA >60 09/15/2017 1128   GFRAA >60 09/15/2017 1128   Lab Results  Component Value Date   WBC 2.9 (L) 09/15/2017   NEUTROABS 2.0 09/15/2017   HGB 9.1 (L) 09/15/2017   HCT 26.5 (L) 09/15/2017   MCV 85.8 09/15/2017   PLT 180 09/15/2017     Assessment/Plan 1. Brain metastasis ([facility])  2. Focal seizures [facility])  Mr.  Windsor is clinically improved today after cessation of Keppra and initiation of mood stabilizing AED regimen.  VPA could be contributing to worsening of leg edema.  We recommend discontinuing Depakote and increasing Lamictal to 100mg  BID because of metabolic inhibition with concurrent VPA.    Episodes of painful clenching may be dystonic in nature.  We recommended trying 50mg  of oral Benadryl at the onset of the next event.  Will continue to follow.  We appreciate the opportunity to participate in the care of Jacob [name].  He should return to clinic in 1 month following his next scheduled brain MRI, for evaluation.  All questions were answered. The patient knows to call the clinic with any problems, questions or concerns. No barriers to learning were detected.  The total time spent in the encounter was 25 minutes and more than 50% was on counseling and review of test results   Ventura Sellers, MD Medical Director of Neuro-Oncology [facility] at [place] 10/07/17 9:22 AM

## 2017-10-07 NOTE — Telephone Encounter (Signed)
Scheduled appt per 7/19 los - pt aware of appt - per patient no print out wanted.

## 2017-10-12 ENCOUNTER — Ambulatory Visit (HOSPITAL_COMMUNITY): Payer: BC Managed Care – PPO | Admitting: Licensed Clinical Social Worker

## 2017-10-12 NOTE — Progress Notes (Signed)
Called and left VM for Jacob [name] offering support.

## 2017-10-13 ENCOUNTER — Telehealth: Payer: Self-pay | Admitting: *Deleted

## 2017-10-13 NOTE — Telephone Encounter (Signed)
Received fax from pharmacy questioning interaction between Depakote and Lamictal.  Advised Depakote was only used for brief interim and patient was instructed to discontinue the Depakote on 10/07/2017.  Faxed back to pharmacy.  Scanned

## 2017-10-17 ENCOUNTER — Telehealth: Payer: Self-pay | Admitting: *Deleted

## 2017-10-17 NOTE — Telephone Encounter (Signed)
Attempted to call patient to advise of MRI of Brain that was scheduled.  Lm to advise with patient and separately with wife for returned call for details.  Pending returned call

## 2017-10-20 ENCOUNTER — Telehealth: Payer: Self-pay

## 2017-10-20 NOTE — Telephone Encounter (Signed)
Spoke with pt wife [name], regarding "updates on pt". Pt wife states that pt is experiencing swelling in his legs and feet and a dark red/purple discoloration. Pt is having difficulty with ambulation due to swelling, "it hurts to walk and he can't move around much" per pt wife. Wife denies that swelling is a new symptom and states that it is unchanged from the last visit with dr. Benay Spice "maybe a little better, maybe a little worse in some ways". Pt wife prefers to keep appt as scheduled for Monday with Dr. Benay Spice. This RN voiced understanding.

## 2017-10-24 ENCOUNTER — Ambulatory Visit (HOSPITAL_BASED_OUTPATIENT_CLINIC_OR_DEPARTMENT_OTHER)
Admission: RE | Admit: 2017-10-24 | Discharge: 2017-10-24 | Disposition: A | Discharge status: Home / Self Care | Payer: BC Managed Care – PPO | Source: Ambulatory Visit | Attending: Oncology | Admitting: Oncology

## 2017-10-24 ENCOUNTER — Telehealth: Payer: Self-pay

## 2017-10-24 ENCOUNTER — Encounter (HOSPITAL_COMMUNITY): Payer: Self-pay

## 2017-10-24 ENCOUNTER — Inpatient Hospital Stay: Payer: BC Managed Care – PPO

## 2017-10-24 ENCOUNTER — Telehealth: Payer: Self-pay | Admitting: Oncology

## 2017-10-24 ENCOUNTER — Ambulatory Visit (HOSPITAL_COMMUNITY)
Admission: RE | Admit: 2017-10-24 | Discharge: 2017-10-24 | Disposition: A | Discharge status: Home / Self Care | Payer: BC Managed Care – PPO | Source: Ambulatory Visit | Attending: Oncology | Admitting: Oncology

## 2017-10-24 ENCOUNTER — Inpatient Hospital Stay: Payer: BC Managed Care – PPO | Attending: Oncology | Admitting: Oncology

## 2017-10-24 VITALS — BP 98/59 | HR 82 | Temp 97.7°F | Resp 18 | Ht 70.0 in | Wt 151.8 lb

## 2017-10-24 DIAGNOSIS — C7931 Secondary malignant neoplasm of brain: Secondary | ICD-10-CM | POA: Insufficient documentation

## 2017-10-24 DIAGNOSIS — Z5189 Encounter for other specified aftercare: Secondary | ICD-10-CM | POA: Insufficient documentation

## 2017-10-24 DIAGNOSIS — C20 Malignant neoplasm of rectum: Secondary | ICD-10-CM

## 2017-10-24 DIAGNOSIS — Z933 Colostomy status: Secondary | ICD-10-CM

## 2017-10-24 DIAGNOSIS — R0789 Other chest pain: Secondary | ICD-10-CM | POA: Diagnosis not present

## 2017-10-24 DIAGNOSIS — G249 Dystonia, unspecified: Secondary | ICD-10-CM | POA: Insufficient documentation

## 2017-10-24 DIAGNOSIS — R0609 Other forms of dyspnea: Secondary | ICD-10-CM | POA: Insufficient documentation

## 2017-10-24 DIAGNOSIS — R918 Other nonspecific abnormal finding of lung field: Secondary | ICD-10-CM | POA: Insufficient documentation

## 2017-10-24 DIAGNOSIS — G4089 Other seizures: Secondary | ICD-10-CM | POA: Insufficient documentation

## 2017-10-24 DIAGNOSIS — Z5111 Encounter for antineoplastic chemotherapy: Secondary | ICD-10-CM | POA: Diagnosis present

## 2017-10-24 DIAGNOSIS — R609 Edema, unspecified: Secondary | ICD-10-CM | POA: Insufficient documentation

## 2017-10-24 DIAGNOSIS — E279 Disorder of adrenal gland, unspecified: Secondary | ICD-10-CM | POA: Diagnosis not present

## 2017-10-24 DIAGNOSIS — C787 Secondary malignant neoplasm of liver and intrahepatic bile duct: Secondary | ICD-10-CM

## 2017-10-24 DIAGNOSIS — R911 Solitary pulmonary nodule: Secondary | ICD-10-CM | POA: Diagnosis not present

## 2017-10-24 DIAGNOSIS — C7800 Secondary malignant neoplasm of unspecified lung: Secondary | ICD-10-CM | POA: Insufficient documentation

## 2017-10-24 DIAGNOSIS — R590 Localized enlarged lymph nodes: Secondary | ICD-10-CM | POA: Insufficient documentation

## 2017-10-24 DIAGNOSIS — M7989 Other specified soft tissue disorders: Secondary | ICD-10-CM | POA: Diagnosis present

## 2017-10-24 DIAGNOSIS — Z95828 Presence of other vascular implants and grafts: Secondary | ICD-10-CM

## 2017-10-24 LAB — CBC WITH DIFFERENTIAL (CANCER CENTER ONLY)
BASOS ABS: 0 10*3/uL (ref 0.0–0.1)
Basophils Relative: 1 %
EOS ABS: 0.2 10*3/uL (ref 0.0–0.5)
Eosinophils Relative: 7 %
HCT: 26.3 % — ABNORMAL LOW (ref 38.4–49.9)
HEMOGLOBIN: 8.3 g/dL — AB (ref 13.0–17.1)
LYMPHS ABS: 0.3 10*3/uL — AB (ref 0.9–3.3)
Lymphocytes Relative: 13 %
MCH: 27.9 pg (ref 27.2–33.4)
MCHC: 31.7 g/dL — ABNORMAL LOW (ref 32.0–36.0)
MCV: 88.2 fL (ref 79.3–98.0)
Monocytes Absolute: 0.2 10*3/uL (ref 0.1–0.9)
Monocytes Relative: 11 %
Neutro Abs: 1.5 10*3/uL (ref 1.5–6.5)
Neutrophils Relative %: 68 %
PLATELETS: 149 10*3/uL (ref 140–400)
RBC: 2.98 MIL/uL — AB (ref 4.20–5.82)
RDW: 20.2 % — ABNORMAL HIGH (ref 11.0–14.6)
WBC: 2.2 10*3/uL — AB (ref 4.0–10.3)

## 2017-10-24 LAB — MAGNESIUM: MAGNESIUM: 2.1 mg/dL (ref 1.7–2.4)

## 2017-10-24 LAB — CMP (CANCER CENTER ONLY)
ALT: 9 U/L (ref 0–44)
AST: 14 U/L — ABNORMAL LOW (ref 15–41)
Albumin: 2.7 g/dL — ABNORMAL LOW (ref 3.5–5.0)
Alkaline Phosphatase: 89 U/L (ref 38–126)
Anion gap: 7 (ref 5–15)
BILIRUBIN TOTAL: 0.3 mg/dL (ref 0.3–1.2)
BUN: 13 mg/dL (ref 6–20)
CHLORIDE: 108 mmol/L (ref 98–111)
CO2: 24 mmol/L (ref 22–32)
CREATININE: 0.74 mg/dL (ref 0.61–1.24)
Calcium: 8.3 mg/dL — ABNORMAL LOW (ref 8.9–10.3)
Glucose, Bld: 80 mg/dL (ref 70–99)
POTASSIUM: 4.3 mmol/L (ref 3.5–5.1)
Sodium: 139 mmol/L (ref 135–145)
TOTAL PROTEIN: 6.2 g/dL — AB (ref 6.5–8.1)

## 2017-10-24 MED ORDER — HEPARIN SOD (PORK) LOCK FLUSH 100 UNIT/ML IV SOLN
500.0000 [IU] | Freq: Once | INTRAVENOUS | Status: AC | PRN
  Administered 2017-10-24: 500 [IU] via INTRAVENOUS
  Filled 2017-10-24: qty 5

## 2017-10-24 MED ORDER — IOHEXOL 300 MG/ML  SOLN
75.0000 mL | Freq: Once | INTRAMUSCULAR | Status: AC | PRN
  Administered 2017-10-24: 75 mL via INTRAVENOUS

## 2017-10-24 MED ORDER — SODIUM CHLORIDE 0.9 % IJ SOLN
10.0000 mL | INTRAMUSCULAR | Status: DC | PRN
  Administered 2017-10-24: 10 mL via INTRAVENOUS
  Filled 2017-10-24: qty 10

## 2017-10-24 NOTE — Progress Notes (Signed)
Bilateral lower extremity venous duplex has been completed. Negative for DVT. Results were given to Melia at Dr. Gearldine Shown office.  10/24/17 11:56 AM Jacob [name] RVT

## 2017-10-24 NOTE — Progress Notes (Signed)
[place] OFFICE PROGRESS NOTE   Diagnosis: Rectal cancer  INTERVAL HISTORY:   Jacob [name] returns for a scheduled visit.  He saw Dr. Mickeal Skinner last month.  He was taken off of Keppra and placed on Lamictal.  No seizures.  The mania symptoms have improved.  He took a vacation to [place].  He reports bilateral leg and foot swelling predating the Lamictal and vacation.  The leg swelling has partially improved.  He developed a sunburn at the feet making it difficult to walk. He reports chest "heaviness "and mild intermittent dyspnea.  The ostomy is functioning.  Objective:  Vital signs in last 24 hours:  There were no vitals taken for this visit.    Resp: End inspiratory rales at the right upper posterior chest, no respiratory distress Cardio: Regular rate and rhythm GI: No hepatomegaly, nontender, left lower quadrant colostomy Vascular: Trace edema with venous engorgement at the lower leg bilaterally.  Trace pitting edema extends to the thigh bilaterally. Neurologic: Alert, no pressured speech, appears oriented Skin: Resolving rash at the dorsum of the right foot  Portacath/PICC-without erythema  Lab Results:  Lab Results  Component Value Date   WBC 2.9 (L) 09/15/2017   HGB 9.1 (L) 09/15/2017   HCT 26.5 (L) 09/15/2017   MCV 85.8 09/15/2017   PLT 180 09/15/2017   NEUTROABS 2.0 09/15/2017    CMP  Lab Results  Component Value Date   NA 130 (L) 09/15/2017   K 3.2 (L) 09/15/2017   CL 97 (L) 09/15/2017   CO2 26 09/15/2017   GLUCOSE 105 (H) 09/15/2017   BUN 9 09/15/2017   CREATININE 0.77 09/15/2017   CALCIUM 8.5 (L) 09/15/2017   PROT 6.0 (L) 09/15/2017   ALBUMIN 2.5 (L) 09/15/2017   AST 60 (H) 09/15/2017   ALT 71 (H) 09/15/2017   ALKPHOS 92 09/15/2017   BILITOT 0.4 09/15/2017   GFRNONAA >60 09/15/2017   GFRAA >60 09/15/2017    Lab Results  Component Value Date   CEA1 20.68 (H) 09/15/2017    Lab Results  Component Value Date   INR 1.05  11/20/2014     Medications: I have reviewed the patient's current medications.   Assessment/Plan: 1. Rectal cancer. Partially obstructing mass noted 1-2 cm from the anal verge on a colonoscopy 03/06/2012. Endoscopic ultrasound 03/14/2012 with a 7.5 mm thick, 3.2 cm wide hypoechoic, irregularly bordered mass that clearly passed into and through the muscularis propria layer of the distal rectal wall (uT3); 3 small (largest 7 mm) perirectal lymph nodes. The lymph nodes were all round, discrete, hypoechoic, homogenous; suspicious for malignant involvement (uN1).  No RAS mutation identified by [facility] 1 testing on the colon resection specimen 06/29/2012, APC alteration identified, microsatellite stable  He began radiation and concurrent Xeloda chemotherapy on 03/20/2012, completed 04/27/2012.   Low anterior resection/coloanal anastomosis and diverting ileostomy 06/29/2012 with the final pathology revealing a T2N0 tumor with extensive fibrosis and negative margins.   Cycle 1 of adjuvant CAPOX 07/19/2012. Cycle 5 of adjuvant CAPOX 10/11/2012.   CEA 2.5 on 11/14/2012.   CEA 14.6 03/08/2013.   Restaging CT evaluation 03/08/2013 with a 4 mm pulmonary nodule left lung base not identified on comparison exam; new liver lesions including a 29 x 26 mm irregular peripheral enhancing rounded lesion in the dome of the right hepatic lobe, a less well-defined new subcapsular lesion in the lateral right hepatic lobe measuring 12 mm, a new subcapsular lesion in the anterior right hepatic lobe adjacent to  the gallbladder fossa measuring 10 mm; a rounded low-density lesion in the inferior right hepatic lobe measuring 10 mm compared to 7 mm on the prior study (radiologist commented this may represent an enlarged cyst).   MRI of the abdomen 04/12/2013 confirmed multiple T2 hyperintense metastatic lesions throughout the right liver   Initiation of FOLFIRI/Avastin with genotype based irinotecan dosing per  the [facility] study 04/05/2013.   Restaging CT evaluation on 06/20/2013 (after 2 cycles/4 treatments) showed improvement in the liver metastases and stable size of a left lower lobe pulmonary nodule now with central cavitation.   Continuation of FOLFIRI/Avastin.   Restaging CT evaluation 08/14/2013-decrease in the size of liver metastases, slight decrease in the size of a cavitary left lower lobe nodule, no evidence of disease progression.   Status post right hepatic lobectomy 09/17/2013. Pathology showed multiple foci of metastatic adenocarcinoma (5 nodules of metastatic adenocarcinoma 4 of which are subcapsular with the nodules ranging in size from 0.6-1.8 cm in greatest dimension). Margins not involved. Biopsy of a portal lymph node showed benign adipose tissue; no lymph node tissue or malignancy.   Normal CEA 11/06/2013   CT 11/06/2013 with a new pleural-based right lower chest lesion, slight in enlargement of the a left-sided lung nodule  CT 01/29/2014 with a decrease in the right lower chest pleural-based lesion and a slight increase of a left-sided lung lesion, other lung lesions were stable  CT chest 05/02/2014 with a decrease in the size of a right lower lobe pulmonary nodule months similar size of a dominant left lower lobe nodule, minimal enlargement of a smaller left lower lobe nodule, no new site of disease  CT chest 11/07/2014 with a slight increase in several left-sided lung nodules  CT chest 02/10/2015 with new small left hilar lymph nodes, a possible new left lower lobe nodule, and stability of other lung nodules  PET scan 03/12/2015 with hypermetabolic left hilar nodes, hypermetabolic left lower lobe nodules, hyper metabolic retroperitoneal nodes, intense hypermetabolism at the coloanal anastomosis, and hypermetabolic thickening in the presacral space  Cycle 1 FOLFIRI/PANITUMUMAB 05/21/2015  Cycle 2 FOLFIRI/PANITUMUMAB 06/05/2015  Cycle 3 FOLFIRI/PANITUMUMAB  06/19/2015  Cycle 4 FOLFIRI 07/10/2015  Cycle 5 FOLFIRI 07/24/2015  Restaging CTs 08/06/2015-resolution of hilar/retroperitoneal adenopathy, improvement in the hypermetabolic lung nodule, other lung nodules are stable, no new lesions  Cycle 6 FOLFIRI with PANITUMUMAB 08/28/2015 -panitumumab dose reduced  Cycle 7 FOLFIRI 09/11/2015-no panitumumab given  Cycle 8 FOLFIRI with PANITUMUMAB 09/25/2015-PANITUMUMAB dose reduced  Cycle 9 FOLFIRI 10/09/2015-no PANITUMUMAB given  Cycle 10 FOLFIRI with panitumumab 10/23/2015  CTs 11/06/2015-possible slight enlargement of a left lower lobe nodule, no other evidence of disease progression.  CTs 02/16/2016-enlargement of left-sided pulmonary nodules and mediastinal/left hilar nodes, improved splenomegaly  CTs 06/21/2016-increased size of mediastinal/left hilar nodes, increased left lung nodules, and increased soft tissue at the porta hepatis  CTs 10/28/2016-progressive disease in the chest and abdomen with an enlarging left hilar mass and slight interval enlargement of pulmonary lesions. Right upper quadrant necrotic nodal mass significantly increased in size with mass effect on the liver and possible invasion. Significant compression of the intrahepatic IVC. New right hepatic lobe lesion. Moderate pelvic ascites.  Cycle 1 FOLFIRI/PANITUMUMAB 11/04/2016  Cycle 2 FOLFIRI/PANITUMUMAB 11/18/2016  Cycle 3 FOLFIRI/panitumumab 12/02/2016  Cycle4 FOLFIRI/PANITUMUMAB 12/15/2016  Cycle 5 FOLFIRI/PANITUMUMAB 12/30/2016  CTs 01/06/2017-no evidence of progressive disease, decreased chest adenopathy, stable left lower lobe nodules, decreased liver metastasis, decreased right retroperitoneal mass  Cycle 6 FOLFIRI/panitumumab 01/13/2017  Cycle 7 FOLFIRI/Panitumumab 01/27/2017  Cycle 8 FOLFIRI/panitumumab 02/17/2017  Cycle 9 FOLFIRI/Panitumumab 03/03/2017  Cycle 10 FOLFIRI/Panitumumab 03/24/2017  CTs 04/06/2017-enlargement of right adrenal mass  (smaller than October 2019), stable left hilar fullness and lung nodules  Cycle 11 FOLFIRI/Panitumumab 04/07/2017  Cycle 12 FOLFIRI/panitumumab 04/21/2017  Cycle 13 FOLFIRI/Panitumumab 05/05/2017  Palliative radiation to the right adrenal mass 226 2019-3 02/2018  Brain MRI 07/28/2017-4intracranial metastasis in the right frontal lobe, left parietal lobe and left occipital lobe. Surrounding edema pronounced in the left occipital lobe.  SBRT to for brain lesions on 08/12/2017  CTs 08/10/2017- new left upper lobe airspace process, stable lung nodules and medius and lymphadenopathy, decreased mass involving the adrenal gland with progression of a necrotic anterior portion of the mass, no liver lesions  Cycle 14 FOLFIRI/panitumumab 08/19/2017, irinotecan dose reduced secondary to thrombocytopenia, Panitumumab dose escalated  Cycle 15 FOLFIRI/panitumumab 09/01/2017) Panitumumab dose reduced secondary to an early rash following cycle 14 2. History ofIrregular bowel habits/rectal bleeding secondary to #1. 3. History of Mild elevation of the liver enzymes. Question secondary to hepatic steatosis. 4. Indeterminate 8 mm posterior right liver lesion on the staging CT 03/06/2012. 5. Mildly elevated CEA at 5.7 on 03/06/2012. 6. History of radiation erythema at the groin and perineum 7. 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 No clinical evidence of thrombophlebitis or extravasation. 8. Delayed nausea following chemotherapy-Decadron prophylaxis was added with cycle 3 CAPOX-improved. 9. Ileostomy takedown 12/07/2012. 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 11. Port-A-Cath placement 12/31/204 12. Severe neutropenia secondary to chemotherapy following cycle 1 of FOLFIRIin 2015, chemotherapy was dose reduced and he received Neulasta with day 15 cycle 1  13. Nausea and vomiting following cycle 1 of FOLFIRI 14. Rectal  stricture-manual/colonoscopic dilatation by Dr. Ardis Hughs 03/01/2016  APR 08/17/2016-no evidence of malignancy 15. History ofLeukopenia/Thrombocytopenia-persistent, potentially a sequelae of chemotherapy or hepatic toxicity from chemotherapy/radiation. Bone marrow biopsy 11/20/2014 showed cellular bone marrow with trilineage hematopoiesis. Significant dyspoiesis was not present and there was no evidence of metastatic carcinoma. Cytogenetic analysis showed the presence of normal male chromosomes with no observable clonal chromosomal abnormalities.  probable cirrhosis 16. Genetic testing-negative genetic panel in March 2014 17. 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 Severe over the face-steroid Dosepak prescribed 07/03/2015. Improved 07/10/2015. Further improved 07/24/2015, 08/07/2015, 08/28/2015 18. Diarrhea secondary to chemotherapy-encouraged to use Imodium  19. History of Paronychia secondary to PANITUMUMAB 20. 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 History of neutropenia, thrombocytopenia 06/23/2017. Possibly related to radiation. He also has a history of suspected portal hypertension secondaryto toxicity from chemotherap 22. Seizure in the setting of brain metastases 08/05/2017, now maintained on Lamictal 23. Altered mental status evaluated by neuro-oncology, Keppra discontinued, improved Disposition: Mr. Muzyka has metastatic rectal cancer.  He was last treated with FOLFIRI/panitumumab on 09/01/2017.  Treatment was halted secondary to altered mental status.  His mental status appears improved.  He will undergo a restaging chest CT today and return for discussion of treatment options 10/26/2017.  Mr. Silguero has developed leg swelling and chest tightness/dyspnea.  He will be referred for bilateral lower extremity Dopplers and a chest CT today.  The restaging CT is to rule out a pulmonary  embolism and look for evidence of progressive rectal cancer.  25 minutes were spent with the patient today.  The majority of the time was used for counseling and coordination of care.  Betsy Coder, MD  10/24/2017  9:43 AM

## 2017-10-24 NOTE — Telephone Encounter (Signed)
Scheduled appt per 8/5 los - pt aware of appts date and time.

## 2017-10-24 NOTE — Telephone Encounter (Signed)
Received call from Marya Amsler to report that pt doppler study is negative for DVT

## 2017-10-25 ENCOUNTER — Other Ambulatory Visit: Payer: Self-pay

## 2017-10-25 DIAGNOSIS — C20 Malignant neoplasm of rectum: Secondary | ICD-10-CM

## 2017-10-26 ENCOUNTER — Inpatient Hospital Stay (HOSPITAL_BASED_OUTPATIENT_CLINIC_OR_DEPARTMENT_OTHER): Payer: BC Managed Care – PPO | Admitting: Nurse Practitioner

## 2017-10-26 ENCOUNTER — Telehealth: Payer: Self-pay

## 2017-10-26 ENCOUNTER — Encounter: Payer: Self-pay | Admitting: Nurse Practitioner

## 2017-10-26 VITALS — BP 95/61 | HR 89 | Temp 98.4°F | Resp 22 | Ht 70.0 in | Wt 155.1 lb

## 2017-10-26 DIAGNOSIS — C787 Secondary malignant neoplasm of liver and intrahepatic bile duct: Secondary | ICD-10-CM | POA: Diagnosis not present

## 2017-10-26 DIAGNOSIS — R0789 Other chest pain: Secondary | ICD-10-CM

## 2017-10-26 DIAGNOSIS — Z5111 Encounter for antineoplastic chemotherapy: Secondary | ICD-10-CM | POA: Diagnosis not present

## 2017-10-26 DIAGNOSIS — C20 Malignant neoplasm of rectum: Secondary | ICD-10-CM | POA: Diagnosis not present

## 2017-10-26 DIAGNOSIS — C7931 Secondary malignant neoplasm of brain: Secondary | ICD-10-CM

## 2017-10-26 DIAGNOSIS — M7989 Other specified soft tissue disorders: Secondary | ICD-10-CM

## 2017-10-26 DIAGNOSIS — R911 Solitary pulmonary nodule: Secondary | ICD-10-CM | POA: Diagnosis not present

## 2017-10-26 DIAGNOSIS — R0609 Other forms of dyspnea: Secondary | ICD-10-CM

## 2017-10-26 DIAGNOSIS — E279 Disorder of adrenal gland, unspecified: Secondary | ICD-10-CM

## 2017-10-26 NOTE — Progress Notes (Addendum)
[place] OFFICE PROGRESS NOTE   Diagnosis: Rectal cancer  INTERVAL HISTORY:   Jacob [name] returns as scheduled.  He continues to note leg swelling with slight improvement.  He continues to have exertional dyspnea with associated chest discomfort.  No fever.  Occasional cough.  Appetite overall is good.  Mental status continues to be improved.  Objective:  Vital signs in last 24 hours:  Blood pressure 95/61, pulse 89, temperature 98.4 F (36.9 C), temperature source Oral, resp. rate (!) 22, height '5\' 10"'  (1.778 m), weight 155 lb 1.6 oz (70.4 kg), SpO2 100 %.    HEENT: No thrush or ulcers. Resp: Lungs clear bilaterally. Cardio: Regular rate and rhythm. GI: Abdomen soft and nontender.  No hepatomegaly.  Left lower quadrant colostomy. Vascular: Pitting edema at the lower legs bilaterally. Neuro: Alert and oriented.  No pressured speech. Skin: Palms without erythema. Port-A-Cath without erythema.   Lab Results:  Lab Results  Component Value Date   WBC 2.2 (L) 10/24/2017   HGB 8.3 (L) 10/24/2017   HCT 26.3 (L) 10/24/2017   MCV 88.2 10/24/2017   PLT 149 10/24/2017   NEUTROABS 1.5 10/24/2017    Imaging:  Ct Chest W Contrast  Result Date: 10/24/2017 CLINICAL DATA:  Metastatic rectal cancer. EXAM: CT CHEST WITH CONTRAST TECHNIQUE: Multidetector CT imaging of the chest was performed during intravenous contrast administration. CONTRAST:  82m OMNIPAQUE IOHEXOL 300 MG/ML  SOLN COMPARISON:  08/10/2017 FINDINGS: Cardiovascular: The heart size is normal. No substantial pericardial effusion. Coronary artery calcification is evident. Left Port-A-Cath tip is positioned at the SVC/RA junction. Mediastinum/Nodes: Prominent left hilar disease, attenuating the left main pulmonary artery measures 3.8 x 3.7 cm today compared to 3.7 x 3.3 cm previously. This lesion encases the left upper and lower lobe airways. 12 mm short axis prevascular lymph node on today's study is stable. 12  mm right hilar lymph node (2:49) is new in the interval as is the 8 mm short axis precarinal lymph node. Lungs/Pleura: The central tracheobronchial airways are patent. Left lung pulmonary metastases again noted. Apical lesions have become un cavitated in the interval. A new 2.7 x 3.9 cm lesion in the superior segment of the left lower lobe shows areas of central low density on soft tissue windows suggesting necrosis. 1.7 x 2.1 cm lingular lesion (5:72) is new in the interval. 2.1 cm left lower lobe lesion (5:63) was 1.8 cm previously. New small left pleural effusion. Upper Abdomen: Caudate lesion in the liver appears progressed in the interval measuring 5.5 cm today compared to 4.1 cm at a similar level previously. Retroperitoneal disease on the right is incompletely visualized. Musculoskeletal: No worrisome lytic or sclerotic osseous abnormality. IMPRESSION: 1. Interval progression of disease in the left lung suggesting metastatic involvement. 2. Mild progression of left hilar nodal disease with new right hilar lymphadenopathy and progressive increase in lymph nodes in the mediastinum, also concerning for metastatic involvement. 3. Interval progression of caudate lobe metastatic lesion. Electronically Signed   By: EMisty StanleyM.D.   On: 10/24/2017 13:19    Medications: I have reviewed the patient's current medications.  Assessment/Plan: 1. Rectal cancer. Partially obstructing mass noted 1-2 cm from the anal verge on a colonoscopy 03/06/2012. Endoscopic ultrasound 03/14/2012 with a 7.5 mm thick, 3.2 cm wide hypoechoic, irregularly bordered mass that clearly passed into and through the muscularis propria layer of the distal rectal wall (uT3); 3 small (largest 7 mm) perirectal lymph nodes. The lymph nodes were all round, discrete,  hypoechoic, homogenous; suspicious for malignant involvement (uN1).  No RAS mutation identified by [facility] testing on the colon resection specimen 06/29/2012, APC alteration  identified, microsatellite stable  He began radiation and concurrent Xeloda chemotherapy on 03/20/2012, completed 04/27/2012.   Low anterior resection/coloanal anastomosis and diverting ileostomy 06/29/2012 with the final pathology revealing a T2N0 tumor with extensive fibrosis and negative margins.   Cycle 1 of adjuvant CAPOX 07/19/2012. Cycle 5 of adjuvant CAPOX 10/11/2012.   CEA 2.5 on 11/14/2012.   CEA 14.6 03/08/2013.   Restaging CT evaluation 03/08/2013 with a 4 mm pulmonary nodule left lung base not identified on comparison exam; new liver lesions including a 29 x 26 mm irregular peripheral enhancing rounded lesion in the dome of the right hepatic lobe, a less well-defined new subcapsular lesion in the lateral right hepatic lobe measuring 12 mm, a new subcapsular lesion in the anterior right hepatic lobe adjacent to the gallbladder fossa measuring 10 mm; a rounded low-density lesion in the inferior right hepatic lobe measuring 10 mm compared to 7 mm on the prior study (radiologist commented this may represent an enlarged cyst).   MRI of the abdomen 04/12/2013 confirmed multiple T2 hyperintense metastatic lesions throughout the right liver   Initiation of FOLFIRI/Avastin with genotype based irinotecan dosing per the [facility] study 04/05/2013.   Restaging CT evaluation on 06/20/2013 (after 2 cycles/4 treatments) showed improvement in the liver metastases and stable size of a left lower lobe pulmonary nodule now with central cavitation.   Continuation of FOLFIRI/Avastin.   Restaging CT evaluation 08/14/2013-decrease in the size of liver metastases, slight decrease in the size of a cavitary left lower lobe nodule, no evidence of disease progression.   Status post right hepatic lobectomy 09/17/2013. Pathology showed multiple foci of metastatic adenocarcinoma (5 nodules of metastatic adenocarcinoma 4 of which are subcapsular with the nodules ranging in size from 0.6-1.8 cm in greatest  dimension). Margins not involved. Biopsy of a portal lymph node showed benign adipose tissue; no lymph node tissue or malignancy.   Normal CEA 11/06/2013   CT 11/06/2013 with a new pleural-based right lower chest lesion, slight in enlargement of the a left-sided lung nodule  CT 01/29/2014 with a decrease in the right lower chest pleural-based lesion and a slight increase of a left-sided lung lesion, other lung lesions were stable  CT chest 05/02/2014 with a decrease in the size of a right lower lobe pulmonary nodule months similar size of a dominant left lower lobe nodule, minimal enlargement of a smaller left lower lobe nodule, no new site of disease  CT chest 11/07/2014 with a slight increase in several left-sided lung nodules  CT chest 02/10/2015 with new small left hilar lymph nodes, a possible new left lower lobe nodule, and stability of other lung nodules  PET scan 03/12/2015 with hypermetabolic left hilar nodes, hypermetabolic left lower lobe nodules, hyper metabolic retroperitoneal nodes, intense hypermetabolism at the coloanal anastomosis, and hypermetabolic thickening in the presacral space  Cycle 1 FOLFIRI/PANITUMUMAB 05/21/2015  Cycle 2 FOLFIRI/PANITUMUMAB 06/05/2015  Cycle 3 FOLFIRI/PANITUMUMAB 06/19/2015  Cycle 4 FOLFIRI 07/10/2015  Cycle 5 FOLFIRI 07/24/2015  Restaging CTs 08/06/2015-resolution of hilar/retroperitoneal adenopathy, improvement in the hypermetabolic lung nodule, other lung nodules are stable, no new lesions  Cycle 6 FOLFIRI with PANITUMUMAB 08/28/2015 -panitumumab dose reduced  Cycle 7 FOLFIRI 09/11/2015-no panitumumab given  Cycle 8 FOLFIRI with PANITUMUMAB 09/25/2015-PANITUMUMAB dose reduced  Cycle 9 FOLFIRI 10/09/2015-no PANITUMUMAB given  Cycle 10 FOLFIRI with panitumumab 10/23/2015  CTs 11/06/2015-possible slight enlargement  of a left lower lobe nodule, no other evidence of disease progression.  CTs 02/16/2016-enlargement of left-sided  pulmonary nodules and mediastinal/left hilar nodes, improved splenomegaly  CTs 06/21/2016-increased size of mediastinal/left hilar nodes, increased left lung nodules, and increased soft tissue at the porta hepatis  CTs 10/28/2016-progressive disease in the chest and abdomen with an enlarging left hilar mass and slight interval enlargement of pulmonary lesions. Right upper quadrant necrotic nodal mass significantly increased in size with mass effect on the liver and possible invasion. Significant compression of the intrahepatic IVC. New right hepatic lobe lesion. Moderate pelvic ascites.  Cycle 1 FOLFIRI/PANITUMUMAB 11/04/2016  Cycle 2 FOLFIRI/PANITUMUMAB 11/18/2016  Cycle 3 FOLFIRI/panitumumab 12/02/2016  Cycle4 FOLFIRI/PANITUMUMAB 12/15/2016  Cycle 5 FOLFIRI/PANITUMUMAB 12/30/2016  CTs 01/06/2017-no evidence of progressive disease, decreased chest adenopathy, stable left lower lobe nodules, decreased liver metastasis, decreased right retroperitoneal mass  Cycle 6 FOLFIRI/panitumumab 01/13/2017  Cycle 7 FOLFIRI/Panitumumab 01/27/2017  Cycle 8 FOLFIRI/panitumumab 02/17/2017  Cycle 9 FOLFIRI/Panitumumab 03/03/2017  Cycle 10 FOLFIRI/Panitumumab 03/24/2017  CTs 04/06/2017-enlargement of right adrenal mass (smaller than October 2019), stable left hilar fullness and lung nodules  Cycle 11 FOLFIRI/Panitumumab 04/07/2017  Cycle 12 FOLFIRI/panitumumab 04/21/2017  Cycle 13 FOLFIRI/Panitumumab 05/05/2017  Palliative radiation to the right adrenal mass 226 2019-3 02/2018  Brain MRI 07/28/2017-4intracranial metastasis in the right frontal lobe, left parietal lobe and left occipital lobe. Surrounding edema pronounced in the left occipital lobe.  SBRT to for brain lesions on 08/12/2017  CTs 08/10/2017- new left upper lobe airspace process, stable lung nodules and medius and lymphadenopathy, decreased mass involving the adrenal gland with progression of a necrotic anterior portion of the mass, no  liver lesions  Cycle 14 FOLFIRI/panitumumab 08/19/2017, irinotecan dose reduced secondary to thrombocytopenia, Panitumumab dose escalated  Cycle 15 FOLFIRI/panitumumab 09/01/2017) Panitumumab dose reduced secondary to an early rash following cycle 14  CT chest 10/24/2017- progression of disease in the left lung; mild progression of left hilar nodal disease with new right hilar lymphadenopathy and progressive increase in lymph nodes in the mediastinum; interval progression of caudate lobe metastatic lesion. 2. History ofIrregular bowel habits/rectal bleeding secondary to #1. 3. History of Mild elevation of the liver enzymes. Question secondary to hepatic steatosis. 4. Indeterminate 8 mm posterior right liver lesion on the staging CT 03/06/2012. 5. Mildly elevated CEA at 5.7 on 03/06/2012. 6. History of radiation erythema at the groin and perineum 7. 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 No clinical evidence of thrombophlebitis or extravasation. 8. Delayed nausea following chemotherapy-Decadron prophylaxis was added with cycle 3 CAPOX-improved. 9. Ileostomy takedown 12/07/2012. 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 11. Port-A-Cath placement 12/31/204 12. Severe neutropenia secondary to chemotherapy following cycle 1 of FOLFIRIin 2015, chemotherapy was dose reduced and he received Neulasta with day 15 cycle 1  13. Nausea and vomiting following cycle 1 of FOLFIRI 14. Rectal stricture-manual/colonoscopic dilatation by Dr. Ardis Hughs 03/01/2016  APR 08/17/2016-no evidence of malignancy 15. History ofLeukopenia/Thrombocytopenia-persistent, potentially a sequelae of chemotherapy or hepatic toxicity from chemotherapy/radiation. Bone marrow biopsy 11/20/2014 showed cellular bone marrow with trilineage hematopoiesis. Significant dyspoiesis was not present and there was no evidence of metastatic carcinoma.  Cytogenetic analysis showed the presence of normal male chromosomes with no observable clonal chromosomal abnormalities.  probable cirrhosis 16. Genetic testing-negative genetic panel in March 2014 17. 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 Severe over the face-steroid Dosepak prescribed 07/03/2015. Improved 07/10/2015. Further improved 07/24/2015, 08/07/2015, 08/28/2015 18. Diarrhea secondary to chemotherapy-encouraged to use Imodium  19. History of  Paronychia secondary to PANITUMUMAB 20. 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 History of neutropenia, thrombocytopenia 06/23/2017. Possibly related to radiation. He also has a history of suspected portal hypertension secondaryto toxicity from chemotherap 22. Seizure in the setting of brain metastases 08/05/2017, now maintained on Lamictal 23. Altered mental status evaluated by neuro-oncology, Keppra discontinued, improved    Disposition: Jacob [name] appears unchanged.  We reviewed the recent CT report/images with him and his wife at today's visit.  They understand there is evidence of disease progression.  They further understand that the findings on CT are likely causing the dyspnea on exertion/chest discomfort.  Dr. Benay Spice recommends resuming treatment with FOLFIRI/Panitumumab.  Jacob [name] agrees with this plan.  He will return for FOLFIRI/Panitumumab on 11/03/2017.  We will see him in follow-up prior to cycle 2 on 11/17/2017.  He will contact the office in the interim with any problems.  Patient seen with Dr. Benay Spice.  25 minutes were spent face-to-face at today's visit with the majority of that time involved in counseling/coordination of care.    Ned Card ANP/GNP-BC   10/26/2017  11:15 AM  This was a shared visit with Ned Card.  We reviewed the CT images with Mr. Dillion and his wife.  There is evidence of progression of the  metastatic rectal cancer.  I recommend resuming systemic therapy.  We discussed repeat treatment with FOLFIRI/panitumumab versus oxalic platinum based therapy.  He agrees to resuming FOLFIRI/panitumumab.  He understands a small chance of a clinical response with FOLFIRI/panitumumab and other standard salvage regimens.  His mental status appears much improved.  Julieanne Manson, MD

## 2017-10-26 NOTE — Telephone Encounter (Signed)
Left a voice message concerning patients upcoming appointment that was scheduled per 8/7 los. under verbal consent from [facility] Jacob [name]) that it was okay to schedule the patient on Friday and injection on Saturday

## 2017-10-27 ENCOUNTER — Telehealth: Payer: Self-pay

## 2017-11-01 ENCOUNTER — Ambulatory Visit: Payer: BC Managed Care – PPO | Admitting: Family Medicine

## 2017-11-02 ENCOUNTER — Other Ambulatory Visit: Payer: BC Managed Care – PPO

## 2017-11-02 ENCOUNTER — Telehealth: Payer: Self-pay

## 2017-11-02 NOTE — Telephone Encounter (Signed)
Returned call to pt regarding appts. Pt notified that appts for infusion/flush/lab have been moved to tomorrow. Pt voiced understanding.

## 2017-11-03 ENCOUNTER — Inpatient Hospital Stay: Payer: BC Managed Care – PPO

## 2017-11-03 VITALS — BP 117/85 | HR 89 | Temp 98.6°F | Resp 18

## 2017-11-03 DIAGNOSIS — C20 Malignant neoplasm of rectum: Secondary | ICD-10-CM

## 2017-11-03 DIAGNOSIS — C787 Secondary malignant neoplasm of liver and intrahepatic bile duct: Principal | ICD-10-CM

## 2017-11-03 DIAGNOSIS — Z5111 Encounter for antineoplastic chemotherapy: Secondary | ICD-10-CM | POA: Diagnosis not present

## 2017-11-03 LAB — CMP (CANCER CENTER ONLY)
ALBUMIN: 3.2 g/dL — AB (ref 3.5–5.0)
ALT: 10 U/L (ref 0–44)
AST: 16 U/L (ref 15–41)
Alkaline Phosphatase: 101 U/L (ref 38–126)
Anion gap: 10 (ref 5–15)
BILIRUBIN TOTAL: 0.5 mg/dL (ref 0.3–1.2)
BUN: 11 mg/dL (ref 6–20)
CO2: 23 mmol/L (ref 22–32)
Calcium: 9.2 mg/dL (ref 8.9–10.3)
Chloride: 107 mmol/L (ref 98–111)
Creatinine: 0.76 mg/dL (ref 0.61–1.24)
GFR, Est AFR Am: >60 mL/min (ref >60)
GFR, Estimated: >60 mL/min (ref >60)
GLUCOSE: 144 mg/dL — AB (ref 70–99)
POTASSIUM: 4.1 mmol/L (ref 3.5–5.1)
Sodium: 140 mmol/L (ref 135–145)
Total Protein: 7 g/dL (ref 6.5–8.1)

## 2017-11-03 LAB — CBC WITH DIFFERENTIAL (CANCER CENTER ONLY)
Basophils Absolute: 0 10*3/uL (ref 0.0–0.1)
Basophils Relative: 0 %
EOS PCT: 7 %
Eosinophils Absolute: 0.2 10*3/uL (ref 0.0–0.5)
HCT: 30.8 % — ABNORMAL LOW (ref 38.4–49.9)
Hemoglobin: 9.4 g/dL — ABNORMAL LOW (ref 13.0–17.1)
LYMPHS ABS: 0.3 10*3/uL — AB (ref 0.9–3.3)
LYMPHS PCT: 12 %
MCH: 27.3 pg (ref 27.2–33.4)
MCHC: 30.5 g/dL — ABNORMAL LOW (ref 32.0–36.0)
MCV: 89.5 fL (ref 79.3–98.0)
MONO ABS: 0.3 10*3/uL (ref 0.1–0.9)
MONOS PCT: 12 %
Neutro Abs: 1.9 10*3/uL (ref 1.5–6.5)
Neutrophils Relative %: 69 %
PLATELETS: 162 10*3/uL (ref 140–400)
RBC: 3.44 MIL/uL — ABNORMAL LOW (ref 4.20–5.82)
RDW: 18.2 % — AB (ref 11.0–14.6)
WBC Count: 2.8 10*3/uL — ABNORMAL LOW (ref 4.0–10.3)

## 2017-11-03 LAB — MAGNESIUM: Magnesium: 1.9 mg/dL (ref 1.7–2.4)

## 2017-11-03 LAB — CEA (IN HOUSE-CHCC): CEA (CHCC-IN HOUSE): 49.16 ng/mL — AB (ref 0.00–5.00)

## 2017-11-03 MED ORDER — SODIUM CHLORIDE 0.9 % IV SOLN
Freq: Once | INTRAVENOUS | Status: AC
  Administered 2017-11-03: 10:00:00 via INTRAVENOUS
  Filled 2017-11-03: qty 250

## 2017-11-03 MED ORDER — PALONOSETRON HCL INJECTION 0.25 MG/5ML
INTRAVENOUS | Status: AC
  Filled 2017-11-03: qty 5

## 2017-11-03 MED ORDER — IRINOTECAN HCL CHEMO INJECTION 100 MG/5ML
100.0000 mg/m2 | Freq: Once | INTRAVENOUS | Status: AC
  Administered 2017-11-03: 180 mg via INTRAVENOUS
  Filled 2017-11-03: qty 9

## 2017-11-03 MED ORDER — LEUCOVORIN CALCIUM INJECTION 350 MG
400.0000 mg/m2 | Freq: Once | INTRAVENOUS | Status: AC
  Administered 2017-11-03: 736 mg via INTRAVENOUS
  Filled 2017-11-03: qty 36.8

## 2017-11-03 MED ORDER — SODIUM CHLORIDE 0.9 % IV SOLN
Freq: Once | INTRAVENOUS | Status: AC
  Administered 2017-11-03: 12:00:00 via INTRAVENOUS
  Filled 2017-11-03: qty 5

## 2017-11-03 MED ORDER — SODIUM CHLORIDE 0.9 % IV SOLN
2.0000 mg/kg | Freq: Once | INTRAVENOUS | Status: AC
  Administered 2017-11-03: 120 mg via INTRAVENOUS
  Filled 2017-11-03: qty 6

## 2017-11-03 MED ORDER — ATROPINE SULFATE 1 MG/ML IJ SOLN
INTRAMUSCULAR | Status: AC
  Filled 2017-11-03: qty 1

## 2017-11-03 MED ORDER — SODIUM CHLORIDE 0.9 % IV SOLN
2400.0000 mg/m2 | INTRAVENOUS | Status: DC
  Administered 2017-11-03: 4400 mg via INTRAVENOUS
  Filled 2017-11-03: qty 88

## 2017-11-03 MED ORDER — ATROPINE SULFATE 1 MG/ML IJ SOLN: 0.5000 mg | Freq: Once | INTRAMUSCULAR | Status: DC | PRN

## 2017-11-03 MED ORDER — PALONOSETRON HCL INJECTION 0.25 MG/5ML
0.2500 mg | Freq: Once | INTRAVENOUS | Status: AC
  Administered 2017-11-03: 0.25 mg via INTRAVENOUS

## 2017-11-03 NOTE — Progress Notes (Signed)
Late entry- Patient refused Atropine stating it gives him constipation for days. Medication returned to [facility].

## 2017-11-03 NOTE — Progress Notes (Signed)
MD would like to continue Vectibix at previous dose = 120 mg Dose basis = 1.7 mg/kg (today's wt = 70 kg) Kennith Center, Pharm.D., CPP 11/03/2017@12 :22 PM

## 2017-11-03 NOTE — Progress Notes (Signed)
Flush appt was conducted and documented on infusion appt.

## 2017-11-03 NOTE — Patient Instructions (Signed)
[place] Discharge Instructions for Patients Receiving Chemotherapy  Today you received the following chemotherapy agents:  Vectibix, Irinotecan, Leucovorin, and 5FU.  To help prevent nausea and vomiting after your treatment, we encourage you to take your nausea medication as directed.   If you develop nausea and vomiting that is not controlled by your nausea medication, call the clinic.   BELOW ARE SYMPTOMS THAT SHOULD BE REPORTED IMMEDIATELY:  *FEVER GREATER THAN 100.5 F  *CHILLS WITH OR WITHOUT FEVER  NAUSEA AND VOMITING THAT IS NOT CONTROLLED WITH YOUR NAUSEA MEDICATION  *UNUSUAL SHORTNESS OF BREATH  *UNUSUAL BRUISING OR BLEEDING  TENDERNESS IN MOUTH AND THROAT WITH OR WITHOUT PRESENCE OF ULCERS  *URINARY PROBLEMS  *BOWEL PROBLEMS  UNUSUAL RASH Items with * indicate a potential emergency and should be followed up as soon as possible.  Feel free to call the clinic should you have any questions or concerns. The clinic phone number is [phone].  Please show the [place] at check-in to the Emergency Department and triage nurse.

## 2017-11-04 ENCOUNTER — Inpatient Hospital Stay: Payer: BC Managed Care – PPO

## 2017-11-04 ENCOUNTER — Other Ambulatory Visit: Payer: BC Managed Care – PPO

## 2017-11-04 ENCOUNTER — Ambulatory Visit: Payer: BC Managed Care – PPO

## 2017-11-05 ENCOUNTER — Inpatient Hospital Stay: Payer: BC Managed Care – PPO

## 2017-11-05 VITALS — BP 111/83 | HR 94 | Temp 97.8°F | Resp 19

## 2017-11-05 DIAGNOSIS — C20 Malignant neoplasm of rectum: Secondary | ICD-10-CM

## 2017-11-05 DIAGNOSIS — Z95828 Presence of other vascular implants and grafts: Secondary | ICD-10-CM

## 2017-11-05 DIAGNOSIS — C787 Secondary malignant neoplasm of liver and intrahepatic bile duct: Secondary | ICD-10-CM

## 2017-11-05 DIAGNOSIS — Z5111 Encounter for antineoplastic chemotherapy: Secondary | ICD-10-CM | POA: Diagnosis not present

## 2017-11-05 MED ORDER — HEPARIN SOD (PORK) LOCK FLUSH 100 UNIT/ML IV SOLN
500.0000 [IU] | Freq: Once | INTRAVENOUS | Status: AC | PRN
  Administered 2017-11-05: 500 [IU] via INTRAVENOUS
  Filled 2017-11-05: qty 5

## 2017-11-05 MED ORDER — SODIUM CHLORIDE 0.9 % IJ SOLN
10.0000 mL | INTRAMUSCULAR | Status: DC | PRN
  Administered 2017-11-05: 10 mL via INTRAVENOUS
  Filled 2017-11-05: qty 10

## 2017-11-05 MED ORDER — PEGFILGRASTIM-CBQV 6 MG/0.6ML ~~LOC~~ SOSY
6.0000 mg | PREFILLED_SYRINGE | Freq: Once | SUBCUTANEOUS | Status: AC
  Administered 2017-11-05: 6 mg via SUBCUTANEOUS

## 2017-11-05 NOTE — Patient Instructions (Signed)
Central Line, Adult A central line is a thin, flexible tube (catheter) that is put in your vein. It can be used to:  Take blood for lab tests.  Give you medicine.  Give you food and nutrients.  The procedure may vary among doctors and hospitals. Follow these instructions at home: Caring for the tube  Follow instructions from your doctor about: ? Flushing the tube with saline solution. ? Cleaning the tube and the area around it.  Only flush with clean (sterile) supplies. The supplies should be from your doctor, a pharmacy, or another place that your doctor recommends.  Before you flush the tube or clean the area around the tube: ? Wash your hands with soap and water. If you cannot use soap and water, use hand sanitizer. ? Clean the central line hub with rubbing alcohol. Caring for your skin  Keep the area where the tube was put in clean and dry.  Every day, and when changing the bandage, check the skin around the central line for: ? Redness, swelling, or pain. ? Fluid or blood. ? Warmth. ? Pus. ? A bad smell. General instructions  Keep the tube clamped, unless it is being used.  Keep your supplies in a clean, dry location.  If you or someone else accidentally pulls on the tube, make sure: ? The bandage (dressing) is okay. ? There is no bleeding. ? The tube has not been pulled out.  Do not use scissors or sharp objects near the tube.  Do not swim or let the tube soak in a tub.  Ask your doctor what activities are safe for you. Your doctor may tell you not to lift anything or move your arm too much.  Take over-the-counter and prescription medicines only as told by your doctor.  Change bandages as told by your doctor.  Keep your bandage dry. If a bandage gets wet, have it changed right away.  Keep all follow-up visits as told by your doctor. This is important. Throwing away supplies  Throw away any syringes in a trash (disposal) container that is only for sharp  items (sharps container). You can buy a sharps container from a pharmacy, or you can make one by using an empty hard plastic bottle with a cover.  Place any used bandages or infusion bags into a plastic bag. Throw that bag in the trash. Contact a doctor if:  You have any of these where the tube was put in: ? Redness, swelling, or pain. ? Fluid or blood. ? A warm feeling. ? Pus or a bad smell. Get help right away if:  You have: ? A fever. ? Chills. ? Trouble getting enough air (shortness of breath). ? Trouble breathing. ? Pain in your chest. ? Swelling in your neck, face, chest, or arm.  You are coughing.  You feel your heart beating fast or skipping beats.  You feel dizzy or you pass out (faint).  There are red lines coming from where the tube was put in.  The area where the tube was put in is bleeding and the bleeding will not stop.  Your tube is hard to flush.  You do not get a blood return from the tube.  The tube gets loose or comes out.  The tube has a hole or a tear.  The tube leaks. Summary  A central line is a thin, flexible tube (catheter) that is put in your vein. It can be used to take blood for lab tests or to   give you medicine.  Follow instructions from your doctor about flushing and cleaning the tube.  Keep the area where the tube was put in clean and dry.  Ask your doctor what activities are safe for you. This information is not intended to replace advice given to you by your health care provider. Make sure you discuss any questions you have with your health care provider. Document Released: 02/23/2012 Document Revised: 03/25/2016 Document Reviewed: 03/25/2016 Elsevier Interactive Patient Education  2017 [place].  Pegfilgrastim injection What is this medicine? PEGFILGRASTIM (PEG fil gra stim) is a long-acting granulocyte colony-stimulating factor that stimulates the growth of neutrophils, a type of white blood cell important in the body's  fight against infection. It is used to reduce the incidence of fever and infection in patients with certain types of cancer who are receiving chemotherapy that affects the bone marrow, and to increase survival after being exposed to high doses of radiation. This medicine may be used for other purposes; ask your health care provider or pharmacist if you have questions. COMMON BRAND NAME(S): Neulasta What should I tell my health care provider before I take this medicine? They need to know if you have any of these conditions: -kidney disease -latex allergy -ongoing radiation therapy -sickle cell disease -skin reactions to acrylic adhesives (On-Body Injector only) -an unusual or allergic reaction to pegfilgrastim, filgrastim, other medicines, foods, dyes, or preservatives -pregnant or trying to get pregnant -breast-feeding How should I use this medicine? This medicine is for injection under the skin. If you get this medicine at home, you will be taught how to prepare and give the pre-filled syringe or how to use the On-body Injector. Refer to the patient Instructions for Use for detailed instructions. Use exactly as directed. Tell your healthcare provider immediately if you suspect that the On-body Injector may not have performed as intended or if you suspect the use of the On-body Injector resulted in a missed or partial dose. It is important that you put your used needles and syringes in a special sharps container. Do not put them in a trash can. If you do not have a sharps container, call your pharmacist or healthcare provider to get one. Talk to your pediatrician regarding the use of this medicine in children. While this drug may be prescribed for selected conditions, precautions do apply. Overdosage: If you think you have taken too much of this medicine contact a poison control center or emergency room at once. NOTE: This medicine is only for you. Do not share this medicine with others. What if I  miss a dose? It is important not to miss your dose. Call your doctor or health care professional if you miss your dose. If you miss a dose due to an On-body Injector failure or leakage, a new dose should be administered as soon as possible using a single prefilled syringe for manual use. What may interact with this medicine? Interactions have not been studied. Give your health care provider a list of all the medicines, herbs, non-prescription drugs, or dietary supplements you use. Also tell them if you smoke, drink alcohol, or use illegal drugs. Some items may interact with your medicine. This list may not describe all possible interactions. Give your health care provider a list of all the medicines, herbs, non-prescription drugs, or dietary supplements you use. Also tell them if you smoke, drink alcohol, or use illegal drugs. Some items may interact with your medicine. What should I watch for while using this medicine? You  may need blood work done while you are taking this medicine. If you are going to need a MRI, CT scan, or other procedure, tell your doctor that you are using this medicine (On-Body Injector only). What side effects may I notice from receiving this medicine? Side effects that you should report to your doctor or health care professional as soon as possible: -allergic reactions like skin rash, itching or hives, swelling of the face, lips, or tongue -dizziness -fever -pain, redness, or irritation at site where injected -pinpoint red spots on the skin -red or dark-brown urine -shortness of breath or breathing problems -stomach or side pain, or pain at the shoulder -swelling -tiredness -trouble passing urine or change in the amount of urine Side effects that usually do not require medical attention (report to your doctor or health care professional if they continue or are bothersome): -bone pain -muscle pain This list may not describe all possible side effects. Call your doctor  for medical advice about side effects. You may report side effects to FDA at 1-800-FDA-1088. Where should I keep my medicine? Keep out of the reach of children. Store pre-filled syringes in a refrigerator between 2 and 8 degrees C (36 and 46 degrees F). Do not freeze. Keep in carton to protect from light. Throw away this medicine if it is left out of the refrigerator for more than 48 hours. Throw away any unused medicine after the expiration date. NOTE: This sheet is a summary. It may not cover all possible information. If you have questions about this medicine, talk to your doctor, pharmacist, or health care provider.  2018 Elsevier/[employer] Standard (2016-03-04 12:58:03)

## 2017-11-07 ENCOUNTER — Ambulatory Visit (INDEPENDENT_AMBULATORY_CARE_PROVIDER_SITE_OTHER): Payer: BC Managed Care – PPO | Admitting: Family Medicine

## 2017-11-07 DIAGNOSIS — N3281 Overactive bladder: Secondary | ICD-10-CM | POA: Diagnosis not present

## 2017-11-07 DIAGNOSIS — D649 Anemia, unspecified: Secondary | ICD-10-CM

## 2017-11-07 DIAGNOSIS — R634 Abnormal weight loss: Secondary | ICD-10-CM

## 2017-11-07 DIAGNOSIS — C20 Malignant neoplasm of rectum: Secondary | ICD-10-CM

## 2017-11-07 DIAGNOSIS — R630 Anorexia: Secondary | ICD-10-CM

## 2017-11-07 DIAGNOSIS — D72819 Decreased white blood cell count, unspecified: Secondary | ICD-10-CM

## 2017-11-07 DIAGNOSIS — G4700 Insomnia, unspecified: Secondary | ICD-10-CM

## 2017-11-07 DIAGNOSIS — R739 Hyperglycemia, unspecified: Secondary | ICD-10-CM

## 2017-11-07 DIAGNOSIS — C787 Secondary malignant neoplasm of liver and intrahepatic bile duct: Secondary | ICD-10-CM

## 2017-11-07 NOTE — Assessment & Plan Note (Addendum)
Increase leafy greens, consider increased lean red meat and using cast iron cookware. Continue to monitor, report any concerns. Slightly improved from last check

## 2017-11-07 NOTE — Patient Instructions (Addendum)
Multivitamins with minerals and iron  Mucinex and fluids to start if no improvement then Zantac 150 mg at bedtime for one month to see if that helps Anemia Anemia is a condition in which you do not have enough red blood cells or hemoglobin. Hemoglobin is a substance in red blood cells that carries oxygen. When you do not have enough red blood cells or hemoglobin (are anemic), your body cannot get enough oxygen and your organs may not work properly. As a result, you may feel very tired or have other problems. What are the causes? Common causes of anemia include:  Excessive bleeding. Anemia can be caused by excessive bleeding inside or outside the body, including bleeding from the intestine or from periods in women.  Poor nutrition.  Long-lasting (chronic) kidney, thyroid, and liver disease.  Bone marrow disorders.  Cancer and treatments for cancer.  HIV (human immunodeficiency virus) and AIDS (acquired immunodeficiency syndrome).  Treatments for HIV and AIDS.  Spleen problems.  Blood disorders.  Infections, medicines, and autoimmune disorders that destroy red blood cells.  What are the signs or symptoms? Symptoms of this condition include:  Minor weakness.  Dizziness.  Headache.  Feeling heartbeats that are irregular or faster than normal (palpitations).  Shortness of breath, especially with exercise.  Paleness.  Cold sensitivity.  Indigestion.  Nausea.  Difficulty sleeping.  Difficulty concentrating.  Symptoms may occur suddenly or develop slowly. If your anemia is mild, you may not have symptoms. How is this diagnosed? This condition is diagnosed based on:  Blood tests.  Your medical history.  A physical exam.  Bone marrow biopsy.  Your health care provider may also check your stool (feces) for blood and may do additional testing to look for the cause of your bleeding. You may also have other tests, including:  Imaging tests, such as a CT scan or  MRI.  Endoscopy.  Colonoscopy.  How is this treated? Treatment for this condition depends on the cause. If you continue to lose a lot of blood, you may need to be treated at a hospital. Treatment may include:  Taking supplements of iron, vitamin Y80, or folic acid.  Taking a hormone medicine (erythropoietin) that can help to stimulate red blood cell growth.  Having a blood transfusion. This may be needed if you lose a lot of blood.  Making changes to your diet.  Having surgery to remove your spleen.  Follow these instructions at home:  Take over-the-counter and prescription medicines only as told by your health care provider.  Take supplements only as told by your health care provider.  Follow any diet instructions that you were given.  Keep all follow-up visits as told by your health care provider. This is important. Contact a health care provider if:  You develop new bleeding anywhere in the body. Get help right away if:  You are very weak.  You are short of breath.  You have pain in your abdomen or chest.  You are dizzy or feel faint.  You have trouble concentrating.  You have bloody or black, tarry stools.  You vomit repeatedly or you vomit up blood. Summary  Anemia is a condition in which you do not have enough red blood cells or enough of a substance in your red blood cells that carries oxygen (hemoglobin).  Symptoms may occur suddenly or develop slowly.  If your anemia is mild, you may not have symptoms.  This condition is diagnosed with blood tests as well as a medical history  and physical exam. Other tests may be needed.  Treatment for this condition depends on the cause of the anemia. This information is not intended to replace advice given to you by your health care provider. Make sure you discuss any questions you have with your health care provider. Document Released: 04/15/2004 Document Revised: 04/09/2016 Document Reviewed: 04/09/2016 Elsevier  Interactive Patient Education  [employer].

## 2017-11-07 NOTE — Assessment & Plan Note (Signed)
Encouraged good sleep hygiene such as dark, quiet room. No blue/green glowing lights such as computer screens in bedroom. No alcohol or stimulants in evening. Cut down on caffeine as able. Regular exercise is helpful but not just prior to bed time. Consider Melatonin 2 to 10 mg at bed

## 2017-11-07 NOTE — Assessment & Plan Note (Signed)
Diagnosed several years ago but improved when he started OTC and myrebetriq now he is up to getting up 4 x a night, so he agrees to restart Myrebetriq

## 2017-11-07 NOTE — Assessment & Plan Note (Signed)
Check cbc 

## 2017-11-08 LAB — COMPREHENSIVE METABOLIC PANEL
ALT: 13 U/L (ref 0–53)
AST: 13 U/L (ref 0–37)
Albumin: 3.7 g/dL (ref 3.5–5.2)
Alkaline Phosphatase: 98 U/L (ref 39–117)
BUN: 23 mg/dL (ref 6–23)
CO2: 30 meq/L (ref 19–32)
CREATININE: 0.77 mg/dL (ref 0.40–1.50)
Calcium: 9.1 mg/dL (ref 8.4–10.5)
Chloride: 102 mEq/L (ref 96–112)
GFR: 115.17 mL/min (ref >60.00)
GLUCOSE: 79 mg/dL (ref 70–99)
Potassium: 4.2 mEq/L (ref 3.5–5.1)
SODIUM: 138 meq/L (ref 135–145)
Total Bilirubin: 0.5 mg/dL (ref 0.2–1.2)
Total Protein: 6.5 g/dL (ref 6.0–8.3)

## 2017-11-08 LAB — CBC
HCT: 31.1 % — ABNORMAL LOW (ref 39.0–52.0)
Hemoglobin: 9.7 g/dL — ABNORMAL LOW (ref 13.0–17.0)
MCHC: 31.3 g/dL (ref 30.0–36.0)
MCV: 87.1 fl (ref 78.0–100.0)
Platelets: 159 10*3/uL (ref 150.0–400.0)
RBC: 3.57 Mil/uL — ABNORMAL LOW (ref 4.22–5.81)
RDW: 19.1 % — AB (ref 11.5–15.5)
WBC: 12 10*3/uL — ABNORMAL HIGH (ref 4.0–10.5)

## 2017-11-08 LAB — T4, FREE: FREE T4: 0.75 ng/dL (ref 0.60–1.60)

## 2017-11-08 LAB — FERRITIN: Ferritin: 179.6 ng/mL (ref 22.0–322.0)

## 2017-11-08 LAB — HEMOGLOBIN A1C: Hgb A1c MFr Bld: 5.1 % (ref 4.6–6.5)

## 2017-11-08 LAB — VITAMIN D 25 HYDROXY (VIT D DEFICIENCY, FRACTURES): VITD: 39.7 ng/mL (ref 30.00–100.00)

## 2017-11-08 LAB — TSH: TSH: 2.66 u[IU]/mL (ref 0.35–4.50)

## 2017-11-09 ENCOUNTER — Other Ambulatory Visit: Payer: Self-pay | Admitting: Radiation Therapy

## 2017-11-13 ENCOUNTER — Other Ambulatory Visit: Payer: Self-pay | Admitting: Oncology

## 2017-11-13 DIAGNOSIS — R630 Anorexia: Secondary | ICD-10-CM | POA: Insufficient documentation

## 2017-11-13 NOTE — Assessment & Plan Note (Signed)
Continues to follow with oncology and is managing his disease closely with them

## 2017-11-13 NOTE — Assessment & Plan Note (Signed)
He feels the Marinol has helped but he does have weight loss. He is encouraged to continue to increase po intake and eat more protein. He was out of town on vacation with limited food options this month.

## 2017-11-13 NOTE — Progress Notes (Signed)
Subjective:    Patient ID: Jacob [name], male    DOB: 13-Nov-1970, 47 y.o.   MRN: [id number]  No chief complaint on file.   HPI Patient is in today for follow up and he is accompanied by his wife. He feels well today but he continues to struggle with anorexia. He does feel the Marinol has helped some but he was out of town at [employer] without a great deal of options for food. No new concerns or febrile illness. Continues to struggle with fatigue and malaise and follows with oncology. Denies CP/palp/SOB/HA/congestion/fevers or GU c/o. Taking meds as prescribed Past Medical History:  Diagnosis Date  . Allergic state 01/19/2012  . 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 Chicken pox as a child   ?  Marland Kitchen Dysrhythmia as child   HX OF IRREGULAR HB AT TIME OF TONSILLECTOMY - SURGERY WAS NOT DONE-CAN'T REMEMBER ANY OTHER DETAILS- NEVER HAD THE SURGERY.  . Elevated liver function tests 01/19/2012  . Fatty liver   . Hernia 6 months old  . History of diarrhea    better since chemo finished  . Hx of migraines   . Lung abnormality 2015  . Lung nodule 08/2013  . Lung nodule, multiple 11/29/2013  . Migraine headache as a child  . Numbness    TOES OF BOTH FEET.  Marland Kitchen Overweight(278.02) 01/19/2012  . Pain    LEFT HEEL PAIN -SEVERE ESPECIALLY AFTER SITTING OR LYING DOWN - DIFFICULT TO GET OUT OF BED AND WALK IN THE MORNINGS BECAUSE OF HEEL PAIN  . 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 Preventative health care 01/19/2012  . Radiation 03/20/12-04/27/12   Pelvis 50.4 gray Rectal cancer  . Rectal cancer ([facility]) 03/06/12   biopsy-adenocarcinoma  . Reflux 01/19/2012  . Sun-damaged skin 01/19/2012    Past Surgical History:  Procedure Laterality Date  . CHOLECYSTECTOMY N/A 09/17/2013   Procedure: CHOLECYSTECTOMY;  Surgeon: Stark Klein, MD;  Location: [facility];  Service: General;  Laterality: N/A;  . COLOSTOMY  TAKEDOWN N/A 08/17/2016   Procedure: LAPAROSCOPIC ABDOMINOPERINEAL RESECTION WITH PERMANENT COLOSTOMY;  Surgeon: Leighton Ruff, MD;  Location: [facility];  Service: General;  Laterality: N/A;  . CYST EXCISION     L EAR AREA  . EUS  03/14/2012   Procedure: LOWER ENDOSCOPIC ULTRASOUND (EUS);  Surgeon: Milus Banister, MD;  Location: [facility];  Service: Endoscopy;  Laterality: N/A;  . HERNIA REPAIR  6 months old   right inguinal repair  . ILEOSTOMY CLOSURE N/A 12/07/2012   Procedure: ILEOSTOMY TAKEDOWN;  Surgeon: Leighton Ruff, MD;  Location: [facility];  Service: General;  Laterality: N/A;  . LAPAROSCOPIC LOW ANTERIOR RESECTION N/A 06/29/2012   Procedure: LAPAROSCOPIC LOW ANTERIOR RESECTION, mobilization splenic flexure,coloanal anastomosis,diverting ileostomy;  Surgeon: Leighton Ruff, MD;  Location: [facility];  Service: General;  Laterality: N/A;  . LAPAROSCOPY N/A 09/17/2013   Procedure: LAPAROSCOPY DIAGNOSTIC;  Surgeon: Stark Klein, MD;  Location: [facility];  Service: General;  Laterality: N/A;  . LIVER ULTRASOUND N/A 09/17/2013   Procedure: LIVER ULTRASOUND;  Surgeon: Stark Klein, MD;  Location: [facility];  Service: General;  Laterality: N/A;  . MICROLARYNGOSCOPY W/VOCAL CORD INJECTION Left 05/16/2017   Procedure: MICROLARYNGOSCOPY WITH VOCAL CORD INJECTION;  Surgeon: Melissa Montane, MD;  Location: [facility];  Service: ENT;  Laterality: Left;  . OPEN HEPATECTOMY  N/A 09/17/2013   Procedure: OPEN  HEPATECTOMY;  Surgeon: Stark Klein, MD;  Location: [facility];  Service: General;  Laterality: N/A;  . PORTACATH PLACEMENT Left 03/21/2013   Procedure: INSERTION PORT-A-CATH;  Surgeon: Leighton Ruff, MD;  Location: [facility];  Service: General;  Laterality: Left;  . RECTAL BIOPSY  03/06/12   distal mass1-2cm from anal verge=Adenocarcinoma    Family History  Adopted: Yes  Problem Relation Age of Onset  . Cancer Father   . Heart disease Father   . Colon cancer Neg Hx     Social History   Socioeconomic  History  . Marital status: Married    Spouse name: Not on file  . Number of children: 1  . Years of education: Not on file  . Highest education level: Not on file  Occupational History  . Occupation: Midwife: [employer]    Comment: [employer]  Social Needs  . Financial resource strain: Not on file  . Food insecurity:    Worry: Not on file    Inability: Not on file  . Transportation needs:    Medical: Not on file    Non-medical: Not on file  Tobacco Use  . Smoking status: Never Smoker  . Smokeless tobacco: Never Used  Substance and Sexual Activity  . Alcohol use: Yes    Alcohol/week: 0.0 standard drinks    Comment: RARE   . Drug use: No    Comment: marijuana past  . Sexual activity: Yes    Partners: Female  Lifestyle  . Physical activity:    Days per week: Not on file    Minutes per session: Not on file  . Stress: Not on file  Relationships  . Social connections:    Talks on phone: Not on file    Gets together: Not on file    Attends religious service: Not on file    Active member of club or organization: Not on file    Attends meetings of clubs or organizations: Not on file    Relationship status: Not on file  . Intimate partner violence:    Fear of current or ex partner: Not on file    Emotionally abused: Not on file    Physically abused: Not on file    Forced sexual activity: Not on file  Other Topics Concern  . Not on file  Social History Narrative   Married to wife, [name]   Has one 18 year old child   Occupation: Teaches metal sculpture at [employer]    Outpatient Medications Prior to Visit  Medication Sig Dispense Refill  . docusate sodium (STOOL SOFTENER) 100 MG capsule Take 100-200 mg by mouth daily as needed (constipation).     Marland Kitchen dronabinol (MARINOL) 5 MG capsule Take 1 capsule (5 mg total) by mouth 3 (three) times daily as needed. 90 capsule 1  . lamoTRIgine (LAMICTAL) 100 MG tablet Take 1 tablet (100 mg total) by mouth 2 (two) times daily. 60  tablet 3  . prochlorperazine (COMPAZINE) 10 MG tablet Take 10 mg by mouth every 6 (six) hours as needed for nausea or vomiting.    Marland Kitchen tiZANidine (ZANAFLEX) 4 MG tablet Take 0.5-1 tablets (2-4 mg total) by mouth every 8 (eight) hours as needed for muscle spasms. 30 tablet 1   Facility-Administered Medications Prior to Visit  Medication Dose Route Frequency Provider Last Rate Last Dose  . atropine injection 0.5 mg  0.5 mg Intravenous Once PRN Ladell Pier, MD        Allergies  Allergen Reactions  . Alprazolam Other (See Comments)    Excessive sedation  . Penicillins Rash    Childhood allergy Has patient had a PCN reaction causing immediate rash, facial/tongue/throat swelling, SOB or lightheadedness with hypotension: Unknown Has patient had a PCN reaction causing severe rash involving mucus membranes or skin necrosis: Unknown Has patient had a PCN reaction that required hospitalization: No Has patient had a PCN reaction occurring within the last 10 years: No If all of the above answers are "NO", then may proceed with Cephalosporin use.     Review of Systems  Constitutional: Positive for malaise/fatigue and weight loss. Negative for fever.  HENT: Negative for congestion.   Eyes: Negative for blurred vision.  Respiratory: Negative for cough and shortness of breath.   Cardiovascular: Negative for chest pain, palpitations and leg swelling.  Gastrointestinal: Positive for abdominal pain. Negative for blood in stool and nausea.  Genitourinary: Negative for dysuria and frequency.  Musculoskeletal: Positive for myalgias. Negative for falls.  Skin: Negative for rash.  Neurological: Negative for dizziness, loss of consciousness and headaches.  Endo/Heme/Allergies: Negative for environmental allergies.  Psychiatric/Behavioral: Negative for depression. The patient is not nervous/anxious.        Objective:    Physical Exam  Constitutional: He is oriented to person, place, and time. He  appears well-developed. No distress.  cachectic  HENT:  Head: Normocephalic and atraumatic.  Nose: Nose normal.  Eyes: Right eye exhibits no discharge. Left eye exhibits no discharge.  Neck: Normal range of motion. Neck supple.  Cardiovascular: Normal rate and regular rhythm.  No murmur heard. Pulmonary/Chest: Effort normal and breath sounds normal.  Abdominal: Soft. Bowel sounds are normal. There is no tenderness.  Musculoskeletal: He exhibits no edema.  Neurological: He is alert and oriented to person, place, and time.  Skin: Skin is warm and dry.  Psychiatric: He has a normal mood and affect.  Nursing note and vitals reviewed.   BP 108/60 (BP Location: Left Arm, Patient Position: Sitting, Cuff Size: Normal)   Pulse 90   Temp 98.1 F (36.7 C) (Oral)   Resp 18   Ht _0  (1.778 m)   Wt 145 lb 12.8 oz (66.1 kg)   SpO2 98%   BMI 20.92 kg/m  Wt Readings from Last 3 Encounters:  11/07/17 145 lb 12.8 oz (66.1 kg)  10/26/17 155 lb 1.6 oz (70.4 kg)  10/24/17 151 lb 12.8 oz (68.9 kg)     Lab Results  Component Value Date   WBC 12.0 (H) 11/07/2017   HGB 9.7 (L) 11/07/2017   HCT 31.1 (L) 11/07/2017   PLT 159.0 11/07/2017   GLUCOSE 79 11/07/2017   ALT 13 11/07/2017   AST 13 11/07/2017   NA 138 11/07/2017   K 4.2 11/07/2017   CL 102 11/07/2017   CREATININE 0.77 11/07/2017   BUN 23 11/07/2017   CO2 30 11/07/2017   TSH 2.66 11/07/2017   INR 1.05 11/20/2014   HGBA1C 5.1 11/07/2017    Lab Results  Component Value Date   TSH 2.66 11/07/2017   Lab Results  Component Value Date   WBC 12.0 (H) 11/07/2017   HGB 9.7 (L) 11/07/2017   HCT 31.1 (L) 11/07/2017   MCV 87.1 11/07/2017   PLT 159.0 11/07/2017   Lab Results  Component Value Date   NA 138 11/07/2017   K 4.2 11/07/2017   CHLORIDE 106 03/24/2017   CO2 30 11/07/2017   GLUCOSE 79 11/07/2017   BUN 23 11/07/2017  CREATININE 0.77 11/07/2017   BILITOT 0.5 11/07/2017   ALKPHOS 98 11/07/2017   AST 13 11/07/2017     ALT 13 11/07/2017   PROT 6.5 11/07/2017   ALBUMIN 3.7 11/07/2017   CALCIUM 9.1 11/07/2017   ANIONGAP 10 11/03/2017   EGFR >60 03/24/2017   GFR 115.17 11/07/2017   No results found for: CHOL No results found for: HDL No results found for: LDLCALC No results found for: TRIG No results found for: CHOLHDL Lab Results  Component Value Date   HGBA1C 5.1 11/07/2017       Assessment & Plan:   Problem List Items Addressed This Visit    Anemia    Increase leafy greens, consider increased lean red meat and using cast iron cookware. Continue to monitor, report any concerns. Slightly improved from last check      Relevant Orders   Ferritin (Completed)   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[facility])    Continues to follow with oncology and is managing his disease closely with them      Leukopenia    Check cbc      Overactive bladder    Diagnosed several years ago but improved when he started OTC and myrebetriq now he is up to getting up 4 x a night, so he agrees to restart Myrebetriq      Relevant Orders   CBC (Completed)   Insomnia    Encouraged good sleep hygiene such as dark, quiet room. No blue/green glowing lights such as computer screens in bedroom. No alcohol or stimulants in evening. Cut down on caffeine as able. Regular exercise is helpful but not just prior to bed time. Consider Melatonin 2 to 10 mg at bed      Anorexia    He feels the Marinol has helped but he does have weight loss. He is encouraged to continue to increase po intake and eat more protein. He was out of town on vacation with limited food options this month.       Other Visit Diagnoses    Hypocalcemia    -  Primary   Relevant Orders   VITAMIN D 25 Hydroxy (Vit-D Deficiency, Fractures) (Completed)   Hyperglycemia       Relevant Orders   Hemoglobin A1c (Completed)   Comprehensive metabolic panel (Completed)   Weight loss       Relevant Orders   TSH (Completed)   T4, free (Completed)   CBC  (Completed)      I am having Renold Genta maintain his docusate sodium, tiZANidine, dronabinol, lamoTRIgine, and prochlorperazine.  No orders of the defined types were placed in this encounter.    Penni Homans, MD

## 2017-11-14 ENCOUNTER — Ambulatory Visit
Admission: RE | Admit: 2017-11-14 | Discharge: 2017-11-14 | Disposition: A | Discharge status: Home / Self Care | Payer: BC Managed Care – PPO | Source: Ambulatory Visit | Attending: Internal Medicine | Admitting: Internal Medicine

## 2017-11-14 DIAGNOSIS — C7931 Secondary malignant neoplasm of brain: Secondary | ICD-10-CM

## 2017-11-14 MED ORDER — GADOBENATE DIMEGLUMINE 529 MG/ML IV SOLN
13.0000 mL | Freq: Once | INTRAVENOUS | Status: AC | PRN
  Administered 2017-11-14: 13 mL via INTRAVENOUS

## 2017-11-16 ENCOUNTER — Other Ambulatory Visit: Payer: Self-pay

## 2017-11-16 ENCOUNTER — Inpatient Hospital Stay: Payer: BC Managed Care – PPO

## 2017-11-16 DIAGNOSIS — C20 Malignant neoplasm of rectum: Secondary | ICD-10-CM

## 2017-11-17 ENCOUNTER — Inpatient Hospital Stay: Payer: BC Managed Care – PPO

## 2017-11-17 ENCOUNTER — Encounter: Payer: Self-pay | Admitting: Nurse Practitioner

## 2017-11-17 ENCOUNTER — Other Ambulatory Visit: Payer: Self-pay | Admitting: *Deleted

## 2017-11-17 ENCOUNTER — Inpatient Hospital Stay (HOSPITAL_BASED_OUTPATIENT_CLINIC_OR_DEPARTMENT_OTHER): Payer: BC Managed Care – PPO | Admitting: Nurse Practitioner

## 2017-11-17 VITALS — BP 130/88 | HR 93 | Temp 98.0°F | Resp 17 | Ht 70.0 in | Wt 146.6 lb

## 2017-11-17 DIAGNOSIS — C20 Malignant neoplasm of rectum: Secondary | ICD-10-CM

## 2017-11-17 DIAGNOSIS — D696 Thrombocytopenia, unspecified: Secondary | ICD-10-CM

## 2017-11-17 DIAGNOSIS — Z5111 Encounter for antineoplastic chemotherapy: Secondary | ICD-10-CM | POA: Diagnosis not present

## 2017-11-17 DIAGNOSIS — C7931 Secondary malignant neoplasm of brain: Secondary | ICD-10-CM | POA: Diagnosis not present

## 2017-11-17 DIAGNOSIS — C787 Secondary malignant neoplasm of liver and intrahepatic bile duct: Secondary | ICD-10-CM | POA: Diagnosis not present

## 2017-11-17 DIAGNOSIS — R0609 Other forms of dyspnea: Secondary | ICD-10-CM

## 2017-11-17 DIAGNOSIS — Z95828 Presence of other vascular implants and grafts: Secondary | ICD-10-CM

## 2017-11-17 DIAGNOSIS — R21 Rash and other nonspecific skin eruption: Secondary | ICD-10-CM

## 2017-11-17 LAB — CBC WITH DIFFERENTIAL (CANCER CENTER ONLY)
BASOS ABS: 0 10*3/uL (ref 0.0–0.1)
Basophils Relative: 0 %
EOS ABS: 0.1 10*3/uL (ref 0.0–0.5)
EOS PCT: 2 %
HCT: 29.6 % — ABNORMAL LOW (ref 38.4–49.9)
Hemoglobin: 9.1 g/dL — ABNORMAL LOW (ref 13.0–17.1)
LYMPHS ABS: 0.5 10*3/uL — AB (ref 0.9–3.3)
LYMPHS PCT: 10 %
MCH: 27.1 pg — AB (ref 27.2–33.4)
MCHC: 30.7 g/dL — ABNORMAL LOW (ref 32.0–36.0)
MCV: 88.1 fL (ref 79.3–98.0)
Monocytes Absolute: 0.6 10*3/uL (ref 0.1–0.9)
Monocytes Relative: 12 %
Neutro Abs: 3.7 10*3/uL (ref 1.5–6.5)
Neutrophils Relative %: 76 %
PLATELETS: 119 10*3/uL — AB (ref 140–400)
RBC: 3.36 MIL/uL — AB (ref 4.20–5.82)
RDW: 18 % — AB (ref 11.0–14.6)
WBC: 4.9 10*3/uL (ref 4.0–10.3)

## 2017-11-17 LAB — CEA (IN HOUSE-CHCC): CEA (CHCC-In House): 52.1 ng/mL — ABNORMAL HIGH (ref 0.00–5.00)

## 2017-11-17 LAB — CBC (CANCER CENTER ONLY)
HCT: 29.7 % — ABNORMAL LOW (ref 38.4–49.9)
Hemoglobin: 9 g/dL — ABNORMAL LOW (ref 13.0–17.1)
MCH: 26.6 pg — ABNORMAL LOW (ref 27.2–33.4)
MCHC: 30.3 g/dL — AB (ref 32.0–36.0)
MCV: 87.9 fL (ref 79.3–98.0)
PLATELETS: 108 10*3/uL — AB (ref 140–400)
RBC: 3.38 MIL/uL — ABNORMAL LOW (ref 4.20–5.82)
RDW: 18 % — AB (ref 11.0–14.6)
WBC: 4.9 10*3/uL (ref 4.0–10.3)

## 2017-11-17 LAB — CMP (CANCER CENTER ONLY)
ALK PHOS: 132 U/L — AB (ref 38–126)
ALT: 13 U/L (ref 0–44)
AST: 19 U/L (ref 15–41)
Albumin: 3.5 g/dL (ref 3.5–5.0)
Anion gap: 9 (ref 5–15)
BILIRUBIN TOTAL: 0.4 mg/dL (ref 0.3–1.2)
BUN: 13 mg/dL (ref 6–20)
CO2: 25 mmol/L (ref 22–32)
CREATININE: 0.78 mg/dL (ref 0.61–1.24)
Calcium: 9.6 mg/dL (ref 8.9–10.3)
Chloride: 104 mmol/L (ref 98–111)
GFR, Est AFR Am: >60 mL/min (ref >60)
GLUCOSE: 89 mg/dL (ref 70–99)
Potassium: 4.3 mmol/L (ref 3.5–5.1)
Sodium: 138 mmol/L (ref 135–145)
TOTAL PROTEIN: 6.8 g/dL (ref 6.5–8.1)

## 2017-11-17 LAB — MAGNESIUM: MAGNESIUM: 2.1 mg/dL (ref 1.7–2.4)

## 2017-11-17 MED ORDER — HEPARIN SOD (PORK) LOCK FLUSH 100 UNIT/ML IV SOLN
500.0000 [IU] | Freq: Once | INTRAVENOUS | Status: DC | PRN
  Filled 2017-11-17: qty 5

## 2017-11-17 MED ORDER — LEUCOVORIN CALCIUM INJECTION 350 MG
400.0000 mg/m2 | Freq: Once | INTRAVENOUS | Status: AC
  Administered 2017-11-17: 736 mg via INTRAVENOUS
  Filled 2017-11-17: qty 36.8

## 2017-11-17 MED ORDER — SODIUM CHLORIDE 0.9 % IJ SOLN
10.0000 mL | INTRAMUSCULAR | Status: DC | PRN
  Filled 2017-11-17: qty 10

## 2017-11-17 MED ORDER — SODIUM CHLORIDE 0.9 % IV SOLN
2400.0000 mg/m2 | INTRAVENOUS | Status: DC
  Administered 2017-11-17: 4400 mg via INTRAVENOUS
  Filled 2017-11-17: qty 88

## 2017-11-17 MED ORDER — IRINOTECAN HCL CHEMO INJECTION 100 MG/5ML
100.0000 mg/m2 | Freq: Once | INTRAVENOUS | Status: AC
  Administered 2017-11-17: 180 mg via INTRAVENOUS
  Filled 2017-11-17: qty 9

## 2017-11-17 MED ORDER — SODIUM CHLORIDE 0.9 % IJ SOLN
10.0000 mL | INTRAMUSCULAR | Status: DC | PRN
  Administered 2017-11-17: 10 mL via INTRAVENOUS
  Filled 2017-11-17: qty 10

## 2017-11-17 MED ORDER — SODIUM CHLORIDE 0.9 % IV SOLN
2.0000 mg/kg | Freq: Once | INTRAVENOUS | Status: AC
  Administered 2017-11-17: 120 mg via INTRAVENOUS
  Filled 2017-11-17: qty 6

## 2017-11-17 MED ORDER — SODIUM CHLORIDE 0.9 % IV SOLN
Freq: Once | INTRAVENOUS | Status: AC
  Administered 2017-11-17: 12:00:00 via INTRAVENOUS
  Filled 2017-11-17: qty 250

## 2017-11-17 MED ORDER — ATROPINE SULFATE 1 MG/ML IJ SOLN
INTRAMUSCULAR | Status: AC
  Filled 2017-11-17: qty 1

## 2017-11-17 MED ORDER — PALONOSETRON HCL INJECTION 0.25 MG/5ML
0.2500 mg | Freq: Once | INTRAVENOUS | Status: AC
  Administered 2017-11-17: 0.25 mg via INTRAVENOUS

## 2017-11-17 MED ORDER — ATROPINE SULFATE 1 MG/ML IJ SOLN: 0.5000 mg | Freq: Once | INTRAMUSCULAR | Status: DC | PRN

## 2017-11-17 MED ORDER — PALONOSETRON HCL INJECTION 0.25 MG/5ML
INTRAVENOUS | Status: AC
  Filled 2017-11-17: qty 5

## 2017-11-17 MED ORDER — SODIUM CHLORIDE 0.9 % IV SOLN
Freq: Once | INTRAVENOUS | Status: AC
  Administered 2017-11-17: 13:00:00 via INTRAVENOUS
  Filled 2017-11-17: qty 5

## 2017-11-17 NOTE — Progress Notes (Signed)
Ok to d/c and waste remainder of 5FU infusion at 1300 on 11/19/17 per Ned Card, NP.

## 2017-11-17 NOTE — Addendum Note (Signed)
Addended by: Betsy Coder B on: 11/17/2017 12:12 PM   Modules accepted: Orders

## 2017-11-17 NOTE — Patient Instructions (Signed)
Section Discharge Instructions for Patients Receiving Chemotherapy  Today you received the following chemotherapy agents Vectibix  To help prevent nausea and vomiting after your treatment, we encourage you to take your nausea medication as directed.    If you develop nausea and vomiting that is not controlled by your nausea medication, call the clinic.   BELOW ARE SYMPTOMS THAT SHOULD BE REPORTED IMMEDIATELY:  *FEVER GREATER THAN 100.5 F  *CHILLS WITH OR WITHOUT FEVER  NAUSEA AND VOMITING THAT IS NOT CONTROLLED WITH YOUR NAUSEA MEDICATION  *UNUSUAL SHORTNESS OF BREATH  *UNUSUAL BRUISING OR BLEEDING  TENDERNESS IN MOUTH AND THROAT WITH OR WITHOUT PRESENCE OF ULCERS  *URINARY PROBLEMS  *BOWEL PROBLEMS  UNUSUAL RASH Items with * indicate a potential emergency and should be followed up as soon as possible.  Feel free to call the clinic should you have any questions or concerns. The clinic phone number is [phone].  Please show the [facility] at check-in to the Emergency Department and triage nurse.

## 2017-11-17 NOTE — Progress Notes (Signed)
[facility] OFFICE PROGRESS NOTE   Diagnosis: Rectal cancer  INTERVAL HISTORY:   Mr. Cassatt returns as scheduled.  He resumed treatment with FOLFIRI/Panitumumab 11/03/2017.  He had a few episodes of nausea/vomiting not clearly related to the chemotherapy.  In retrospect he wonders if symptoms due to constipation.  No diarrhea.  No mouth sores.  He notes a slight rash on his neck and chest, minimal on the face. He continues to have intermittent dyspnea.  He noted bone pain following Udenyca.  Objective:  Vital signs in last 24 hours:  Blood pressure 130/88, pulse 93, temperature 98 F (36.7 C), temperature source Oral, resp. rate 17, height '5\' 10"'  (1.778 m), weight 146 lb 9.6 oz (66.5 kg), SpO2 99 %.    HEENT: No thrush.  Scabbed lesion right lower lip.  Appears to be healing. Resp: Lungs clear bilaterally.  No respiratory distress. Cardio: Regular rate and rhythm. GI: Abdomen soft and nontender.  No hepatomegaly.  Left lower quadrant colostomy. Vascular: Very minimal lower leg edema bilaterally. Neuro: Alert and oriented. Skin: Mild acne type rash upper chest. Port-A-Cath without erythema.   Lab Results:  Lab Results  Component Value Date   WBC 4.9 11/17/2017   HGB 9.0 (L) 11/17/2017   HCT 29.7 (L) 11/17/2017   MCV 87.9 11/17/2017   PLT 108 (L) 11/17/2017   NEUTROABS 1.9 11/03/2017    Imaging:  No results found.  Medications: I have reviewed the patient's current medications.  Assessment/Plan: 1. Rectal cancer. Partially obstructing mass noted 1-2 cm from the anal verge on a colonoscopy 03/06/2012. Endoscopic ultrasound 03/14/2012 with a 7.5 mm thick, 3.2 cm wide hypoechoic, irregularly bordered mass that clearly passed into and through the muscularis propria layer of the distal rectal wall (uT3); 3 small (largest 7 mm) perirectal lymph nodes. The lymph nodes were all round, discrete, hypoechoic, homogenous; suspicious for malignant involvement  (uN1).  No RAS mutation identified by [facility] 1 testing on the colon resection specimen 06/29/2012, APC alteration identified, microsatellite stable  He began radiation and concurrent Xeloda chemotherapy on 03/20/2012, completed 04/27/2012.   Low anterior resection/coloanal anastomosis and diverting ileostomy 06/29/2012 with the final pathology revealing a T2N0 tumor with extensive fibrosis and negative margins.   Cycle 1 of adjuvant CAPOX 07/19/2012. Cycle 5 of adjuvant CAPOX 10/11/2012.   CEA 2.5 on 11/14/2012.   CEA 14.6 03/08/2013.   Restaging CT evaluation 03/08/2013 with a 4 mm pulmonary nodule left lung base not identified on comparison exam; new liver lesions including a 29 x 26 mm irregular peripheral enhancing rounded lesion in the dome of the right hepatic lobe, a less well-defined new subcapsular lesion in the lateral right hepatic lobe measuring 12 mm, a new subcapsular lesion in the anterior right hepatic lobe adjacent to the gallbladder fossa measuring 10 mm; a rounded low-density lesion in the inferior right hepatic lobe measuring 10 mm compared to 7 mm on the prior study (radiologist commented this may represent an enlarged cyst).   MRI of the abdomen 04/12/2013 confirmed multiple T2 hyperintense metastatic lesions throughout the right liver   Initiation of FOLFIRI/Avastin with genotype based irinotecan dosing per the [facility] study 04/05/2013.   Restaging CT evaluation on 06/20/2013 (after 2 cycles/4 treatments) showed improvement in the liver metastases and stable size of a left lower lobe pulmonary nodule now with central cavitation.   Continuation of FOLFIRI/Avastin.   Restaging CT evaluation 08/14/2013-decrease in the size of liver metastases, slight decrease in the size of a cavitary  left lower lobe nodule, no evidence of disease progression.   Status post right hepatic lobectomy 09/17/2013. Pathology showed multiple foci of metastatic adenocarcinoma (5  nodules of metastatic adenocarcinoma 4 of which are subcapsular with the nodules ranging in size from 0.6-1.8 cm in greatest dimension). Margins not involved. Biopsy of a portal lymph node showed benign adipose tissue; no lymph node tissue or malignancy.   Normal CEA 11/06/2013   CT 11/06/2013 with a new pleural-based right lower chest lesion, slight in enlargement of the a left-sided lung nodule  CT 01/29/2014 with a decrease in the right lower chest pleural-based lesion and a slight increase of a left-sided lung lesion, other lung lesions were stable  CT chest 05/02/2014 with a decrease in the size of a right lower lobe pulmonary nodule months similar size of a dominant left lower lobe nodule, minimal enlargement of a smaller left lower lobe nodule, no new site of disease  CT chest 11/07/2014 with a slight increase in several left-sided lung nodules  CT chest 02/10/2015 with new small left hilar lymph nodes, a possible new left lower lobe nodule, and stability of other lung nodules  PET scan 03/12/2015 with hypermetabolic left hilar nodes, hypermetabolic left lower lobe nodules, hyper metabolic retroperitoneal nodes, intense hypermetabolism at the coloanal anastomosis, and hypermetabolic thickening in the presacral space  Cycle 1 FOLFIRI/PANITUMUMAB 05/21/2015  Cycle 2 FOLFIRI/PANITUMUMAB 06/05/2015  Cycle 3 FOLFIRI/PANITUMUMAB 06/19/2015  Cycle 4 FOLFIRI 07/10/2015  Cycle 5 FOLFIRI 07/24/2015  Restaging CTs 08/06/2015-resolution of hilar/retroperitoneal adenopathy, improvement in the hypermetabolic lung nodule, other lung nodules are stable, no new lesions  Cycle 6 FOLFIRI with PANITUMUMAB 08/28/2015 -panitumumab dose reduced  Cycle 7 FOLFIRI 09/11/2015-no panitumumab given  Cycle 8 FOLFIRI with PANITUMUMAB 09/25/2015-PANITUMUMAB dose reduced  Cycle 9 FOLFIRI 10/09/2015-no PANITUMUMAB given  Cycle 10 FOLFIRI with panitumumab 10/23/2015  CTs 11/06/2015-possible slight  enlargement of a left lower lobe nodule, no other evidence of disease progression.  CTs 02/16/2016-enlargement of left-sided pulmonary nodules and mediastinal/left hilar nodes, improved splenomegaly  CTs 06/21/2016-increased size of mediastinal/left hilar nodes, increased left lung nodules, and increased soft tissue at the porta hepatis  CTs 10/28/2016-progressive disease in the chest and abdomen with an enlarging left hilar mass and slight interval enlargement of pulmonary lesions. Right upper quadrant necrotic nodal mass significantly increased in size with mass effect on the liver and possible invasion. Significant compression of the intrahepatic IVC. New right hepatic lobe lesion. Moderate pelvic ascites.  Cycle 1 FOLFIRI/PANITUMUMAB 11/04/2016  Cycle 2 FOLFIRI/PANITUMUMAB 11/18/2016  Cycle 3 FOLFIRI/panitumumab 12/02/2016  Cycle4 FOLFIRI/PANITUMUMAB 12/15/2016  Cycle 5 FOLFIRI/PANITUMUMAB 12/30/2016  CTs 01/06/2017-no evidence of progressive disease, decreased chest adenopathy, stable left lower lobe nodules, decreased liver metastasis, decreased right retroperitoneal mass  Cycle 6 FOLFIRI/panitumumab 01/13/2017  Cycle 7 FOLFIRI/Panitumumab 01/27/2017  Cycle 8 FOLFIRI/panitumumab 02/17/2017  Cycle 9 FOLFIRI/Panitumumab 03/03/2017  Cycle 10 FOLFIRI/Panitumumab 03/24/2017  CTs 04/06/2017-enlargement of right adrenal mass (smaller than October 2019), stable left hilar fullness and lung nodules  Cycle 11 FOLFIRI/Panitumumab 04/07/2017  Cycle 12 FOLFIRI/panitumumab 04/21/2017  Cycle 13 FOLFIRI/Panitumumab 05/05/2017  Palliative radiation to the right adrenal mass 226 2019-3 02/2018  Brain MRI 07/28/2017-4intracranial metastasis in the right frontal lobe, left parietal lobe and left occipital lobe. Surrounding edema pronounced in the left occipital lobe.  SBRT to for brain lesions on 08/12/2017  CTs 08/10/2017- new left upper lobe airspace process, stable lung nodules and medius  and lymphadenopathy, decreased mass involving the adrenal gland with progression of a necrotic anterior portion of the  mass, no liver lesions  Cycle 14 FOLFIRI/panitumumab 08/19/2017, irinotecan dose reduced secondary to thrombocytopenia, Panitumumab dose escalated  Cycle 15 FOLFIRI/panitumumab 09/01/2017) Panitumumab dose reduced secondary to an early rash following cycle 14  CT chest 10/24/2017- progression of disease in the left lung; mild progression of left hilar nodal disease with new right hilar lymphadenopathy and progressive increase in lymph nodes in the mediastinum; interval progression of caudate lobe metastatic lesion.  Cycle 1 FOLFIRI/Panitumumab 11/03/2017  Cycle 2 FOLFIRI/Panitumumab 11/17/2017 2. History ofIrregular bowel habits/rectal bleeding secondary to #1. 3. History of Mild elevation of the liver enzymes. Question secondary to hepatic steatosis. 4. Indeterminate 8 mm posterior right liver lesion on the staging CT 03/06/2012. 5. Mildly elevated CEA at 5.7 on 03/06/2012. 6. History of radiation erythema at the groin and perineum 7. 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 No clinical evidence of thrombophlebitis or extravasation. 8. Delayed nausea following chemotherapy-Decadron prophylaxis was added with cycle 3 CAPOX-improved. 9. Ileostomy takedown 12/07/2012. 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 11. Port-A-Cath placement 12/31/204 12. Severe neutropenia secondary to chemotherapy following cycle 1 of FOLFIRIin 2015, chemotherapy was dose reduced and he received Neulasta with day 15 cycle 1  13. Nausea and vomiting following cycle 1 of FOLFIRI 14. Rectal stricture-manual/colonoscopic dilatation by Dr. Ardis Hughs 03/01/2016  APR 08/17/2016-no evidence of malignancy 15. History ofLeukopenia/Thrombocytopenia-persistent, potentially a sequelae of chemotherapy or hepatic toxicity from  chemotherapy/radiation. Bone marrow biopsy 11/20/2014 showed cellular bone marrow with trilineage hematopoiesis. Significant dyspoiesis was not present and there was no evidence of metastatic carcinoma. Cytogenetic analysis showed the presence of normal male chromosomes with no observable clonal chromosomal abnormalities.  probable cirrhosis 16. Genetic testing-negative genetic panel in March 2014 17. 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 Severe over the face-steroid Dosepak prescribed 07/03/2015. Improved 07/10/2015. Further improved 07/24/2015, 08/07/2015, 08/28/2015 18. Diarrhea secondary to chemotherapy-encouraged to use Imodium  19. History of Paronychia secondary to PANITUMUMAB 20. 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 History of neutropenia, thrombocytopenia 06/23/2017. Possibly related to radiation. He also has a history of suspected portal hypertension secondaryto toxicity from chemotherap 22. Seizure in the setting of brain metastases 08/05/2017, now maintained onLamictal 23. Altered mental status evaluated by neuro-oncology, Keppra discontinued, improved   Disposition: Mr. Millette appears stable.  He has completed 1 cycle of FOLFIRI/Panitumumab.  Plan to proceed with cycle 2 today as scheduled.  We reviewed the CBC from today.  He has mild thrombocytopenia.  He understands to contact the office with any bleeding.  He will return for lab, follow-up and cycle 3 FOLFIRI/Panitumumab in 2 weeks.  He will contact the office in the interim with any problems.  Plan reviewed with Dr. Benay Spice.    Ned Card ANP/GNP-BC   11/17/2017  10:17 AM

## 2017-11-17 NOTE — Progress Notes (Signed)
Lab orders

## 2017-11-17 NOTE — Progress Notes (Signed)
Brain and Spine Tumor Board Documentation  Jacob [name] was presented by Cecil Cobbs, MD at Brain and Spine Tumor Board on 11/17/2017, which included representatives from neuro oncology, radiation oncology, surgical oncology, radiology, pathology, navigation.  Jacob [name] was presented as a current patient with history of the following treatments:  .  Additionally, we reviewed previous medical and familial history, history of present illness, and recent lab results along with all available histopathologic and imaging studies. The tumor board considered available treatment options and made the following recommendations:  Active surveillance  Tumor board is a meeting of clinicians from various specialty areas who evaluate and discuss patients for whom a multidisciplinary approach is being considered. Final determinations in the plan of care are those of the provider(s). The responsibility for follow up of recommendations given during tumor board is that of the provider.   Today's extended care, comprehensive team conference, Jacob [name] was not present for the discussion and was not examined.

## 2017-11-18 ENCOUNTER — Encounter: Payer: Self-pay | Admitting: Internal Medicine

## 2017-11-18 ENCOUNTER — Inpatient Hospital Stay (HOSPITAL_BASED_OUTPATIENT_CLINIC_OR_DEPARTMENT_OTHER): Payer: BC Managed Care – PPO | Admitting: Internal Medicine

## 2017-11-18 VITALS — BP 145/94 | HR 109 | Temp 97.8°F | Resp 18 | Ht 70.0 in | Wt 146.0 lb

## 2017-11-18 DIAGNOSIS — C7931 Secondary malignant neoplasm of brain: Secondary | ICD-10-CM | POA: Diagnosis not present

## 2017-11-18 DIAGNOSIS — G249 Dystonia, unspecified: Secondary | ICD-10-CM | POA: Diagnosis not present

## 2017-11-18 DIAGNOSIS — C20 Malignant neoplasm of rectum: Secondary | ICD-10-CM

## 2017-11-18 DIAGNOSIS — G4089 Other seizures: Secondary | ICD-10-CM

## 2017-11-18 DIAGNOSIS — Z5111 Encounter for antineoplastic chemotherapy: Secondary | ICD-10-CM | POA: Diagnosis not present

## 2017-11-18 NOTE — Progress Notes (Signed)
[facility] [place], [place] [address] [phone]   Interval Evaluation  Date of Service: 11/18/17 Patient Name: Jacob [name] Patient MRN: [id number] Patient DOB: 1970-12-06 Provider: Ventura Sellers, MD  Identifying Statement:  Jacob [name] is a 47 y.o. 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[place])    03/20/2012 - 04/27/2012 Radiation Therapy    RT for 28 Fractions w / Xeloda     04/30/2015 -  Chemotherapy    The patient had palonosetron (ALOXI) injection 0.25 mg, 0.25 mg, Intravenous,  Once, 27 of 28 cycles Administration: 0.25 mg (05/21/2015), 0.25 mg (06/05/2015), 0.25 mg (06/19/2015), 0.25 mg (07/10/2015), 0.25 mg (07/24/2015), 0.25 mg (08/28/2015), 0.25 mg (09/11/2015), 0.25 mg (09/25/2015), 0.25 mg (10/09/2015), 0.25 mg (10/23/2015), 0.25 mg (11/04/2016), 0.25 mg (11/18/2016), 0.25 mg (12/02/2016), 0.25 mg (12/15/2016), 0.25 mg (12/30/2016), 0.25 mg (01/13/2017), 0.25 mg (01/27/2017), 0.25 mg (02/17/2017), 0.25 mg (03/03/2017), 0.25 mg (03/24/2017), 0.25 mg (04/07/2017), 0.25 mg (04/21/2017), 0.25 mg (05/05/2017), 0.25 mg (08/19/2017), 0.25 mg (09/01/2017), 0.25 mg (11/03/2017), 0.25 mg (11/17/2017) pegfilgrastim (NEULASTA) injection 6 mg, 6 mg, Subcutaneous, Once, 23 of 23 cycles Administration: 6 mg (05/23/2015), 6 mg (06/07/2015), 6 mg (06/21/2015), 6 mg (07/12/2015), 6 mg (07/26/2015), 6 mg (08/30/2015), 6 mg (09/13/2015), 6 mg (09/27/2015), 6 mg (10/11/2015), 6 mg (10/25/2015), 6 mg (11/06/2016), 6 mg (11/20/2016), 6 mg (12/04/2016), 6 mg (12/17/2016), 6 mg (01/01/2017), 6 mg (01/15/2017), 6 mg (01/29/2017), 6 mg (02/19/2017), 6 mg (03/05/2017), 6 mg (03/26/2017), 6 mg (04/09/2017), 6 mg (04/23/2017), 6 mg (05/07/2017) pegfilgrastim-cbqv (UDENYCA) injection 6 mg, 6 mg, Subcutaneous, Once, 3 of 4 cycles Administration: 6  mg (08/22/2017), 6 mg (11/05/2017) irinotecan (CAMPTOSAR) 258 mg in dextrose 5 % 500 mL chemo infusion, 135 mg/m2 = 258 mg (75 % of original dose 180 mg/m2), Intravenous,  Once, 27 of 28 cycles Dose modification: 135 mg/m2 (original dose 180 mg/m2, Cycle 1, Reason: Provider Judgment), 100 mg/m2 (original dose 180 mg/m2, Cycle 24, Reason: Provider Judgment) Administration: 258 mg (05/21/2015), 260 mg (06/05/2015), 258 mg (06/19/2015), 258 mg (07/10/2015), 260 mg (07/24/2015), 260 mg (08/28/2015), 260 mg (09/11/2015), 260 mg (09/25/2015), 260 mg (10/23/2015), 260 mg (11/04/2016), 240 mg (11/18/2016), 240 mg (12/02/2016), 240 mg (12/15/2016), 240 mg (12/30/2016), 240 mg (01/13/2017), 240 mg (01/27/2017), 240 mg (02/17/2017), 240 mg (03/03/2017), 240 mg (03/24/2017), 240 mg (04/07/2017), 240 mg (04/21/2017), 240 mg (05/05/2017), 180 mg (08/19/2017), 180 mg (09/01/2017), 180 mg (11/03/2017), 180 mg (11/17/2017) leucovorin 764 mg in dextrose 5 % 250 mL infusion, 400 mg/m2 = 764 mg, Intravenous,  Once, 27 of 28 cycles Administration: 764 mg (05/21/2015), 764 mg (06/05/2015), 764 mg (06/19/2015), 764 mg (07/10/2015), 760 mg (07/24/2015), 760 mg (08/28/2015), 760 mg (09/11/2015), 760 mg (09/25/2015), 764 mg (10/09/2015), 764 mg (10/23/2015), 764 mg (11/04/2016), 764 mg (11/18/2016), 764 mg (12/02/2016), 764 mg (12/15/2016), 716 mg (12/30/2016), 716 mg (01/13/2017), 716 mg (01/27/2017), 716 mg (02/17/2017), 716 mg (03/03/2017), 716 mg (03/24/2017), 716 mg (04/07/2017), 716 mg (04/21/2017), 716 mg (05/05/2017), 736 mg (08/19/2017), 736 mg (09/01/2017), 736 mg (11/03/2017), 736 mg (11/17/2017) fluorouracil (ADRUCIL) chemo injection 750 mg, 400 mg/m2 = 750 mg, Intravenous,  Once, 10 of 10 cycles Administration: 750 mg (05/21/2015), 750 mg (06/05/2015), 750 mg (06/19/2015), 750 mg (07/10/2015), 750 mg (07/24/2015), 750 mg (08/28/2015), 750 mg (09/11/2015),  750 mg (09/25/2015), 750 mg (10/09/2015), 750 mg (10/23/2015) leucovorin injection 38 mg, 20 mg/m2 = 38 mg, Intravenous,  Once, 1 of 1  cycle fosaprepitant (EMEND) 150 mg, dexamethasone (DECADRON) 12 mg in sodium chloride 0.9 % 145 mL IVPB, , Intravenous,  Once, 27 of 28 cycles Administration:  (05/21/2015),  (06/05/2015),  (06/19/2015),  (07/10/2015),  (07/24/2015),  (08/28/2015),  (09/11/2015),  (09/25/2015),  (10/09/2015),  (10/23/2015),  (11/04/2016),  (11/18/2016),  (12/02/2016),  (12/15/2016),  (12/30/2016),  (01/13/2017),  (01/27/2017),  (02/17/2017),  (03/03/2017),  (03/24/2017),  (04/07/2017),  (04/21/2017),  (05/05/2017),  (08/19/2017),  (09/01/2017),  (11/03/2017),  (11/17/2017) fluorouracil (ADRUCIL) 4,600 mg in sodium chloride 0.9 % 58 mL chemo infusion, 2,400 mg/m2 = 4,600 mg, Intravenous, 1 Day/Dose, 27 of 28 cycles Administration: 4,600 mg (05/21/2015), 4,600 mg (06/05/2015), 4,600 mg (06/19/2015), 4,600 mg (07/10/2015), 4,600 mg (07/24/2015), 4,600 mg (08/28/2015), 4,600 mg (09/11/2015), 4,600 mg (09/25/2015), 4,600 mg (10/09/2015), 4,600 mg (10/23/2015), 4,600 mg (11/04/2016), 4,600 mg (11/18/2016), 4,600 mg (12/02/2016), 4,600 mg (12/15/2016), 4,300 mg (12/30/2016), 4,300 mg (01/13/2017), 4,300 mg (01/27/2017), 4,300 mg (02/17/2017), 4,300 mg (03/03/2017), 4,300 mg (03/24/2017), 4,300 mg (04/07/2017), 4,300 mg (04/21/2017), 4,300 mg (05/05/2017), 4,300 mg (08/19/2017), 4,400 mg (09/01/2017), 4,400 mg (11/03/2017), 4,400 mg (11/17/2017)  for chemotherapy treatment.      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[place])    04/30/2015 -  Chemotherapy    The patient had palonosetron (ALOXI) injection 0.25 mg, 0.25 mg, Intravenous,  Once, 27 of 28 cycles Administration: 0.25 mg (05/21/2015), 0.25 mg (06/05/2015), 0.25 mg (06/19/2015), 0.25 mg (07/10/2015), 0.25 mg (07/24/2015), 0.25 mg (08/28/2015), 0.25 mg (09/11/2015), 0.25 mg (09/25/2015), 0.25 mg (10/09/2015), 0.25 mg (10/23/2015), 0.25 mg (11/04/2016), 0.25 mg (11/18/2016), 0.25 mg (12/02/2016), 0.25 mg (12/15/2016), 0.25 mg (12/30/2016), 0.25 mg (01/13/2017), 0.25 mg  (01/27/2017), 0.25 mg (02/17/2017), 0.25 mg (03/03/2017), 0.25 mg (03/24/2017), 0.25 mg (04/07/2017), 0.25 mg (04/21/2017), 0.25 mg (05/05/2017), 0.25 mg (08/19/2017), 0.25 mg (09/01/2017), 0.25 mg (11/03/2017), 0.25 mg (11/17/2017) pegfilgrastim (NEULASTA) injection 6 mg, 6 mg, Subcutaneous, Once, 23 of 23 cycles Administration: 6 mg (05/23/2015), 6 mg (06/07/2015), 6 mg (06/21/2015), 6 mg (07/12/2015), 6 mg (07/26/2015), 6 mg (08/30/2015), 6 mg (09/13/2015), 6 mg (09/27/2015), 6 mg (10/11/2015), 6 mg (10/25/2015), 6 mg (11/06/2016), 6 mg (11/20/2016), 6 mg (12/04/2016), 6 mg (12/17/2016), 6 mg (01/01/2017), 6 mg (01/15/2017), 6 mg (01/29/2017), 6 mg (02/19/2017), 6 mg (03/05/2017), 6 mg (03/26/2017), 6 mg (04/09/2017), 6 mg (04/23/2017), 6 mg (05/07/2017) pegfilgrastim-cbqv (UDENYCA) injection 6 mg, 6 mg, Subcutaneous, Once, 3 of 4 cycles Administration: 6 mg (08/22/2017), 6 mg (11/05/2017) irinotecan (CAMPTOSAR) 258 mg in dextrose 5 % 500 mL chemo infusion, 135 mg/m2 = 258 mg (75 % of original dose 180 mg/m2), Intravenous,  Once, 27 of 28 cycles Dose modification: 135 mg/m2 (original dose 180 mg/m2, Cycle 1, Reason: Provider Judgment), 100 mg/m2 (original dose 180 mg/m2, Cycle 24, Reason: Provider Judgment) Administration: 258 mg (05/21/2015), 260 mg (06/05/2015), 258 mg (06/19/2015), 258 mg (07/10/2015), 260 mg (07/24/2015), 260 mg (08/28/2015), 260 mg (09/11/2015), 260 mg (09/25/2015), 260 mg (10/23/2015), 260 mg (11/04/2016), 240 mg (11/18/2016), 240 mg (12/02/2016), 240 mg (12/15/2016), 240 mg (12/30/2016), 240 mg (01/13/2017), 240 mg (01/27/2017), 240 mg (02/17/2017), 240 mg (03/03/2017), 240 mg (03/24/2017), 240 mg (04/07/2017), 240 mg (04/21/2017), 240 mg (05/05/2017), 180 mg (08/19/2017), 180 mg (09/01/2017), 180 mg (11/03/2017), 180 mg (11/17/2017) leucovorin 764 mg in dextrose 5 % 250 mL infusion, 400 mg/m2 =  764 mg, Intravenous,  Once, 27 of 28 cycles Administration: 764 mg (05/21/2015), 764 mg (06/05/2015), 764 mg (06/19/2015), 764 mg (07/10/2015), 760 mg  (07/24/2015), 760 mg (08/28/2015), 760 mg (09/11/2015), 760 mg (09/25/2015), 764 mg (10/09/2015), 764 mg (10/23/2015), 764 mg (11/04/2016), 764 mg (11/18/2016), 764 mg (12/02/2016), 764 mg (12/15/2016), 716 mg (12/30/2016), 716 mg (01/13/2017), 716 mg (01/27/2017), 716 mg (02/17/2017), 716 mg (03/03/2017), 716 mg (03/24/2017), 716 mg (04/07/2017), 716 mg (04/21/2017), 716 mg (05/05/2017), 736 mg (08/19/2017), 736 mg (09/01/2017), 736 mg (11/03/2017), 736 mg (11/17/2017) fluorouracil (ADRUCIL) chemo injection 750 mg, 400 mg/m2 = 750 mg, Intravenous,  Once, 10 of 10 cycles Administration: 750 mg (05/21/2015), 750 mg (06/05/2015), 750 mg (06/19/2015), 750 mg (07/10/2015), 750 mg (07/24/2015), 750 mg (08/28/2015), 750 mg (09/11/2015), 750 mg (09/25/2015), 750 mg (10/09/2015), 750 mg (10/23/2015) leucovorin injection 38 mg, 20 mg/m2 = 38 mg, Intravenous,  Once, 1 of 1 cycle fosaprepitant (EMEND) 150 mg, dexamethasone (DECADRON) 12 mg in sodium chloride 0.9 % 145 mL IVPB, , Intravenous,  Once, 27 of 28 cycles Administration:  (05/21/2015),  (06/05/2015),  (06/19/2015),  (07/10/2015),  (07/24/2015),  (08/28/2015),  (09/11/2015),  (09/25/2015),  (10/09/2015),  (10/23/2015),  (11/04/2016),  (11/18/2016),  (12/02/2016),  (12/15/2016),  (12/30/2016),  (01/13/2017),  (01/27/2017),  (02/17/2017),  (03/03/2017),  (03/24/2017),  (04/07/2017),  (04/21/2017),  (05/05/2017),  (08/19/2017),  (09/01/2017),  (11/03/2017),  (11/17/2017) fluorouracil (ADRUCIL) 4,600 mg in sodium chloride 0.9 % 58 mL chemo infusion, 2,400 mg/m2 = 4,600 mg, Intravenous, 1 Day/Dose, 27 of 28 cycles Administration: 4,600 mg (05/21/2015), 4,600 mg (06/05/2015), 4,600 mg (06/19/2015), 4,600 mg (07/10/2015), 4,600 mg (07/24/2015), 4,600 mg (08/28/2015), 4,600 mg (09/11/2015), 4,600 mg (09/25/2015), 4,600 mg (10/09/2015), 4,600 mg (10/23/2015), 4,600 mg (11/04/2016), 4,600 mg (11/18/2016), 4,600 mg (12/02/2016), 4,600 mg (12/15/2016), 4,300 mg (12/30/2016), 4,300 mg (01/13/2017), 4,300 mg (01/27/2017), 4,300 mg (02/17/2017), 4,300 mg  (03/03/2017), 4,300 mg (03/24/2017), 4,300 mg (04/07/2017), 4,300 mg (04/21/2017), 4,300 mg (05/05/2017), 4,300 mg (08/19/2017), 4,400 mg (09/01/2017), 4,400 mg (11/03/2017), 4,400 mg (11/17/2017)  for chemotherapy treatment.      Interim History:  Jacob [name] presents following recent MRI brain.  He feels tired and sluggish since restarting chemotherapy, not dissimilar from prior experience.  He denies any seizures since transition to lamictal monotherapy.  No recent changes in behavior.  In addition, he denies any episodes of involuntary fist clenching described at previous visits, for which there was concern for dystonia.  Working part time at [employer].  Medications: Current Outpatient Medications on File Prior to Visit  Medication Sig Dispense Refill  . docusate sodium (STOOL SOFTENER) 100 MG capsule Take 100-200 mg by mouth daily as needed (constipation).     Marland Kitchen dronabinol (MARINOL) 5 MG capsule Take 1 capsule (5 mg total) by mouth 3 (three) times daily as needed. 90 capsule 1  . lamoTRIgine (LAMICTAL) 100 MG tablet Take 1 tablet (100 mg total) by mouth 2 (two) times daily. 60 tablet 3  . prochlorperazine (COMPAZINE) 10 MG tablet Take 10 mg by mouth every 6 (six) hours as needed for nausea or vomiting.    Marland Kitchen tiZANidine (ZANAFLEX) 4 MG tablet Take 0.5-1 tablets (2-4 mg total) by mouth every 8 (eight) hours as needed for muscle spasms. 30 tablet 1   Current Facility-Administered Medications on File Prior to Visit  Medication Dose Route Frequency Provider Last Rate Last Dose  . atropine injection 0.5 mg  0.5 mg Intravenous Once PRN Ladell Pier, MD        Allergies:  Allergies  Allergen Reactions  . Alprazolam Other (See Comments)    Excessive sedation  . Penicillins Rash    Childhood allergy Has patient had a PCN reaction causing immediate rash, facial/tongue/throat swelling, SOB or lightheadedness with hypotension: Unknown Has patient had a PCN reaction causing severe rash involving  mucus membranes or skin necrosis: Unknown Has patient had a PCN reaction that required hospitalization: No Has patient had a PCN reaction occurring within the last 10 years: No If all of the above answers are "NO", then may proceed with Cephalosporin use.    Past Medical History:  Past Medical History:  Diagnosis Date  . Allergic state 01/19/2012  . 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 Chicken pox as a child   ?  Marland Kitchen Dysrhythmia as child   HX OF IRREGULAR HB AT TIME OF TONSILLECTOMY - SURGERY WAS NOT DONE-CAN'T REMEMBER ANY OTHER DETAILS- NEVER HAD THE SURGERY.  . Elevated liver function tests 01/19/2012  . Fatty liver   . Hernia 6 months old  . History of diarrhea    better since chemo finished  . Hx of migraines   . Lung abnormality 2015  . Lung nodule 08/2013  . Lung nodule, multiple 11/29/2013  . Migraine headache as a child  . Numbness    TOES OF BOTH FEET.  Marland Kitchen Overweight(278.02) 01/19/2012  . Pain    LEFT HEEL PAIN -SEVERE ESPECIALLY AFTER SITTING OR LYING DOWN - DIFFICULT TO GET OUT OF BED AND WALK IN THE MORNINGS BECAUSE OF HEEL PAIN  . 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 Preventative health care 01/19/2012  . Radiation 03/20/12-04/27/12   Pelvis 50.4 gray Rectal cancer  . Rectal cancer ([place]) 03/06/12   biopsy-adenocarcinoma  . Reflux 01/19/2012  . Sun-damaged skin 01/19/2012   Past Surgical History:  Past Surgical History:  Procedure Laterality Date  . CHOLECYSTECTOMY N/A 09/17/2013   Procedure: CHOLECYSTECTOMY;  Surgeon: Stark Klein, MD;  Location: [facility];  Service: General;  Laterality: N/A;  . COLOSTOMY TAKEDOWN N/A 08/17/2016   Procedure: LAPAROSCOPIC ABDOMINOPERINEAL RESECTION WITH PERMANENT COLOSTOMY;  Surgeon: Leighton Ruff, MD;  Location: [facility];  Service: General;  Laterality: N/A;  . CYST EXCISION     L EAR AREA  . EUS  03/14/2012   Procedure: LOWER  ENDOSCOPIC ULTRASOUND (EUS);  Surgeon: Milus Banister, MD;  Location: [facility];  Service: Endoscopy;  Laterality: N/A;  . HERNIA REPAIR  6 months old   right inguinal repair  . ILEOSTOMY CLOSURE N/A 12/07/2012   Procedure: ILEOSTOMY TAKEDOWN;  Surgeon: Leighton Ruff, MD;  Location: [facility];  Service: General;  Laterality: N/A;  . LAPAROSCOPIC LOW ANTERIOR RESECTION N/A 06/29/2012   Procedure: LAPAROSCOPIC LOW ANTERIOR RESECTION, mobilization splenic flexure,coloanal anastomosis,diverting ileostomy;  Surgeon: Leighton Ruff, MD;  Location: [facility];  Service: General;  Laterality: N/A;  . LAPAROSCOPY N/A 09/17/2013   Procedure: LAPAROSCOPY DIAGNOSTIC;  Surgeon: Stark Klein, MD;  Location: [facility];  Service: General;  Laterality: N/A;  . LIVER ULTRASOUND N/A 09/17/2013   Procedure: LIVER ULTRASOUND;  Surgeon: Stark Klein, MD;  Location: [facility];  Service: General;  Laterality: N/A;  . MICROLARYNGOSCOPY W/VOCAL CORD INJECTION Left 05/16/2017   Procedure: MICROLARYNGOSCOPY WITH VOCAL CORD INJECTION;  Surgeon: Melissa Montane, MD;  Location: [facility];  Service: ENT;  Laterality: Left;  . OPEN HEPATECTOMY  N/A 09/17/2013   Procedure: OPEN HEPATECTOMY;  Surgeon: Stark Klein, MD;  Location: [facility];  Service: General;  Laterality: N/A;  . PORTACATH PLACEMENT Left 03/21/2013   Procedure: INSERTION PORT-A-CATH;  Surgeon: Leighton Ruff, MD;  Location: [facility];  Service: General;  Laterality: Left;  . RECTAL BIOPSY  03/06/12   distal mass1-2cm from anal verge=Adenocarcinoma   Social History:  Social History   Socioeconomic History  . Marital status: Married    Spouse name: Not on file  . Number of children: 1  . Years of education: Not on file  . Highest education level: Not on file  Occupational History  . Occupation: Midwife: [employer]    Comment: [employer]  Social Needs  . Financial resource strain: Not on file  . Food insecurity:    Worry: Not on file    Inability: Not on  file  . Transportation needs:    Medical: Not on file    Non-medical: Not on file  Tobacco Use  . Smoking status: Never Smoker  . Smokeless tobacco: Never Used  Substance and Sexual Activity  . Alcohol use: Yes    Alcohol/week: 0.0 standard drinks    Comment: RARE   . Drug use: No    Comment: marijuana past  . Sexual activity: Yes    Partners: Female  Lifestyle  . Physical activity:    Days per week: Not on file    Minutes per session: Not on file  . Stress: Not on file  Relationships  . Social connections:    Talks on phone: Not on file    Gets together: Not on file    Attends religious service: Not on file    Active member of club or organization: Not on file    Attends meetings of clubs or organizations: Not on file    Relationship status: Not on file  . Intimate partner violence:    Fear of current or ex partner: Not on file    Emotionally abused: Not on file    Physically abused: Not on file    Forced sexual activity: Not on file  Other Topics Concern  . Not on file  Social History Narrative   Married to wife, [name]   Has one 49 year old child   Occupation: Emergency planning/management officer at [employer]   Family History:  Family History  Adopted: Yes  Problem Relation Age of Onset  . Cancer Father   . Heart disease Father   . Colon cancer Neg Hx     Review of Systems: Constitutional: Denies fevers, chills or abnormal weight loss Eyes: Denies blurriness of vision Ears, nose, mouth, throat, and face: Denies mucositis or sore throat Respiratory: Denies cough, dyspnea or wheezes Cardiovascular: Denies palpitation, chest discomfort or lower extremity swelling Gastrointestinal:  Colostomy GU: Denies dysuria or incontinence Skin: Denies abnormal skin rashes Neurological: +headaches Musculoskeletal: +joint pain, No decrease in ROM Behavioral/Psych: mood instability   Physical Exam: Vitals:   11/18/17 0914  BP: (!) 145/94  Pulse: (!) 109  Resp: 18  Temp: 97.8 F  (36.6 C)  SpO2: 100%   KPS: 90. General: Alert, cooperative, pleasant, in no acute distress Head: Normal EENT: No conjunctival injection or scleral icterus. Oral mucosa moist Lungs: Resp effort normal Cardiac: Regular rate and rhythm Abdomen: Colostomy C/D/I Skin: No rashes cyanosis or petechiae. Extremities: No clubbing or edema  Neurologic Exam: Mental Status: Awake, alert, attentive to examiner. Language is fluent with intact comprehension.  Cranial Nerves: Visual acuity is grossly normal. Visual  fields are full. Extra-ocular movements intact. No ptosis. Face is symmetric, tongue midline. Motor: Tone and bulk are normal. Power is full in both arms and legs. Reflexes are symmetric, no pathologic reflexes present. Intact finger to nose bilaterally Sensory: Intact to light touch and temperature Gait: Normal and tandem gait is normal.   Labs: I have reviewed the data as listed    Component Value Date/Time   NA 138 11/17/2017 0917   NA 142 03/24/2017 1009   K 4.3 11/17/2017 0917   K 4.0 03/24/2017 1009   CL 104 11/17/2017 0917   CL 108 (H) 08/30/2012 0903   CO2 25 11/17/2017 0917   CO2 27 03/24/2017 1009   GLUCOSE 89 11/17/2017 0917   GLUCOSE 96 03/24/2017 1009   GLUCOSE 96 08/30/2012 0903   BUN 13 11/17/2017 0917   BUN 12.2 03/24/2017 1009   CREATININE 0.78 11/17/2017 0917   CREATININE 0.7 03/24/2017 1009   CALCIUM 9.6 11/17/2017 0917   CALCIUM 8.8 03/24/2017 1009   PROT 6.8 11/17/2017 0917   PROT 6.1 (L) 03/24/2017 1009   ALBUMIN 3.5 11/17/2017 0917   ALBUMIN 3.6 03/24/2017 1009   AST 19 11/17/2017 0917   AST 19 03/24/2017 1009   ALT 13 11/17/2017 0917   ALT 14 03/24/2017 1009   ALKPHOS 132 (H) 11/17/2017 0917   ALKPHOS 67 03/24/2017 1009   BILITOT 0.4 11/17/2017 0917   BILITOT <0.22 03/24/2017 1009   GFRNONAA >60 11/17/2017 0917   GFRAA >60 11/17/2017 0917   Lab Results  Component Value Date   WBC 4.9 11/17/2017   WBC 4.9 11/17/2017   NEUTROABS 3.7  11/17/2017   HGB 9.0 (L) 11/17/2017   HGB 9.1 (L) 11/17/2017   HCT 29.7 (L) 11/17/2017   HCT 29.6 (L) 11/17/2017   MCV 87.9 11/17/2017   MCV 88.1 11/17/2017   PLT 108 (L) 11/17/2017   PLT 119 (L) 11/17/2017   Imaging:  [facility] Clinician Interpretation: I have personally reviewed the CNS images as listed.  My interpretation, in the context of the patient's clinical presentation, is stable disease  Ct Chest W Contrast  Result Date: 10/24/2017 CLINICAL DATA:  Metastatic rectal cancer. EXAM: CT CHEST WITH CONTRAST TECHNIQUE: Multidetector CT imaging of the chest was performed during intravenous contrast administration. CONTRAST:  15mL OMNIPAQUE IOHEXOL 300 MG/ML  SOLN COMPARISON:  08/10/2017 FINDINGS: Cardiovascular: The heart size is normal. No substantial pericardial effusion. Coronary artery calcification is evident. Left Port-A-Cath tip is positioned at the SVC/RA junction. Mediastinum/Nodes: Prominent left hilar disease, attenuating the left main pulmonary artery measures 3.8 x 3.7 cm today compared to 3.7 x 3.3 cm previously. This lesion encases the left upper and lower lobe airways. 12 mm short axis prevascular lymph node on today's study is stable. 12 mm right hilar lymph node (2:49) is new in the interval as is the 8 mm short axis precarinal lymph node. Lungs/Pleura: The central tracheobronchial airways are patent. Left lung pulmonary metastases again noted. Apical lesions have become un cavitated in the interval. A new 2.7 x 3.9 cm lesion in the superior segment of the left lower lobe shows areas of central low density on soft tissue windows suggesting necrosis. 1.7 x 2.1 cm lingular lesion (5:72) is new in the interval. 2.1 cm left lower lobe lesion (5:63) was 1.8 cm previously. New small left pleural effusion. Upper Abdomen: Caudate lesion in the liver appears progressed in the interval measuring 5.5 cm today compared to 4.1 cm at a similar level previously. Retroperitoneal  disease on the right  is incompletely visualized. Musculoskeletal: No worrisome lytic or sclerotic osseous abnormality. IMPRESSION: 1. Interval progression of disease in the left lung suggesting metastatic involvement. 2. Mild progression of left hilar nodal disease with new right hilar lymphadenopathy and progressive increase in lymph nodes in the mediastinum, also concerning for metastatic involvement. 3. Interval progression of caudate lobe metastatic lesion. Electronically Signed   By: Misty Stanley M.D.   On: 10/24/2017 13:19   Mr Jeri Cos QM Contrast  Result Date: 11/14/2017 CLINICAL DATA:  Metastatic rectal cancer. SRS to 4 brain metastases on 08/12/2017. EXAM: MRI HEAD WITHOUT AND WITH CONTRAST TECHNIQUE: Multiplanar, multiecho pulse sequences of the brain and surrounding structures were obtained without and with intravenous contrast. CONTRAST:  5mL MULTIHANCE GADOBENATE DIMEGLUMINE 529 MG/ML IV SOLN COMPARISON:  08/03/2017 FINDINGS: BRAIN New Lesions: None. Larger lesions: None. Stable or Smaller lesions: 1. 3 mm right frontal lobe with resolved edema (series 13, image 105, previously 6 mm) 2. 11 mm left occipital lobe with decreased edema (series 13, image 63, previously 17 mm) 3. 7 mm left occipital lobe with decreased edema (series 13, image 76, previously 18 mm) 4. 4 mm left parietal lobe with persistent minimal edema (series 13, image 103, previously 5 mm) Other Brain findings: There is a small amount of chronic blood products associated with the 2 left occipital lobe metastases. No acute infarct, midline shift, or extra-axial fluid collection is identified. The ventricles are normal in size. Vascular: Major intracranial vascular flow voids are preserved. Skull and upper cervical spine: Similar appearance of diffuse calvarial bone marrow heterogeneity without dominant enhancing or destructive lesion identified. Sinuses/Orbits: Unremarkable orbits. Paranasal sinuses and mastoid air cells are clear. Other: None.  IMPRESSION: 1. Decreased size of 4 treated brain metastases with decreased edema. 2. No new metastases. Electronically Signed   By: Logan Bores M.D.   On: 11/14/2017 11:32     Assessment/Plan 1. Brain metastasis ([place])  2. Focal seizures Jacob [name])  Jacob [name] is clinically and radiographically stable today  He should continue Lamictal to 100mg  BID for seizures.  Benadryl 50mg  PRN for dystonic episodes.  We appreciate the opportunity to participate in the care of Jacob [name].  He should return to clinic in 3 month following his next scheduled brain MRI, for evaluation.  All questions were answered. The patient knows to call the clinic with any problems, questions or concerns. No barriers to learning were detected.  The total time spent in the encounter was 25 minutes and more than 50% was on counseling and review of test results   Ventura Sellers, MD Medical Director of Neuro-Oncology [facility] [place] 11/18/17 9:11 AM

## 2017-11-19 ENCOUNTER — Inpatient Hospital Stay: Payer: BC Managed Care – PPO

## 2017-11-19 VITALS — BP 111/87 | HR 114 | Temp 98.2°F | Resp 18

## 2017-11-19 DIAGNOSIS — Z5111 Encounter for antineoplastic chemotherapy: Secondary | ICD-10-CM | POA: Diagnosis not present

## 2017-11-19 DIAGNOSIS — C20 Malignant neoplasm of rectum: Secondary | ICD-10-CM

## 2017-11-19 DIAGNOSIS — C787 Secondary malignant neoplasm of liver and intrahepatic bile duct: Principal | ICD-10-CM

## 2017-11-19 MED ORDER — HEPARIN SOD (PORK) LOCK FLUSH 100 UNIT/ML IV SOLN
500.0000 [IU] | Freq: Once | INTRAVENOUS | Status: AC | PRN
  Administered 2017-11-19: 500 [IU]
  Filled 2017-11-19: qty 5

## 2017-11-19 MED ORDER — SODIUM CHLORIDE 0.9 % IJ SOLN
10.0000 mL | INTRAMUSCULAR | Status: DC | PRN
  Administered 2017-11-19: 10 mL
  Filled 2017-11-19: qty 10

## 2017-11-19 MED ORDER — PEGFILGRASTIM-CBQV 6 MG/0.6ML ~~LOC~~ SOSY
PREFILLED_SYRINGE | SUBCUTANEOUS | Status: AC
  Filled 2017-11-19: qty 0.6

## 2017-11-19 MED ORDER — PEGFILGRASTIM-CBQV 6 MG/0.6ML ~~LOC~~ SOSY
6.0000 mg | PREFILLED_SYRINGE | Freq: Once | SUBCUTANEOUS | Status: AC
  Administered 2017-11-19: 6 mg via SUBCUTANEOUS

## 2017-11-22 ENCOUNTER — Telehealth: Payer: Self-pay | Admitting: *Deleted

## 2017-11-22 NOTE — Telephone Encounter (Signed)
Verbal order received and read back from Dr. Benay Spice  for Jacob [name] to report to ED.  Order given to Jacob [name] at this time.    "I'm okay.  I've been through this five or six times.  If I feel worse, I will go to the ER.  Will call tomorrow if needed.

## 2017-11-22 NOTE — Telephone Encounter (Signed)
Called patient for further assessment and pain.  Jacob [name] says "the last two days have been the worst, he's suffering.  May have taken Acetaminophen because he does not want to be more constipated.  Last BM was two days ago.  Call him for more accurate information.  Message left to return call or report to ED.

## 2017-11-22 NOTE — Telephone Encounter (Signed)
"  The pain has definitely subsided.  I'm starting to fel a little better.  Pain, seven out of ten on pain scale would come and go.  Have not taken anything except Gwendolyn Lima the past two days.  Bowels starting to soften up.  I've vomited six t times.  There were two tiny blood clots with mucus when I vomited.  It's not really measurable.  Sipping on water all day because when I try larger amount, it comes up.  This is not new but a recurring thing for me."

## 2017-11-22 NOTE — Telephone Encounter (Signed)
"  Teddy Spike calling for my husband.  He's feeling bad stomach pain, staying in bed, except to get up to vomit.  Not eating, not a lot but a little blood our today.  He may have vomited about four times today.  I can bring him in between 8:00 am and 2:00 pm because I have to drive to pick  my daughter up from school at 2:00 pm."

## 2017-11-23 ENCOUNTER — Telehealth: Payer: Self-pay | Admitting: Family Medicine

## 2017-11-23 NOTE — Telephone Encounter (Signed)
Copied from [place] [id number]. Topic: Quick Communication - See Telephone Encounter >> Nov 23, 2017 10:57 AM Synthia Innocent wrote: CRM for notification. See Telephone encounter for: 11/23/17. [facility] requesting to speak to provider regarding medications

## 2017-11-24 ENCOUNTER — Telehealth: Payer: Self-pay | Admitting: Oncology

## 2017-11-24 NOTE — Telephone Encounter (Signed)
LMVM regarding appointments being added per 8/29 los

## 2017-11-27 ENCOUNTER — Other Ambulatory Visit: Payer: Self-pay | Admitting: Oncology

## 2017-11-29 ENCOUNTER — Telehealth: Payer: Self-pay | Admitting: *Deleted

## 2017-11-29 NOTE — Telephone Encounter (Signed)
Patient called left message stating that imaging place had called him to schedule his next MRI.  He states he told them he does not need one yet because he just had one.  The only image ordered that hasn't been scheduled yet isn't due until end of November.   Attempted to call patient back twice to explain and unable to leave messages on voicemail as vm was full.

## 2017-12-01 ENCOUNTER — Inpatient Hospital Stay: Payer: BC Managed Care – PPO

## 2017-12-01 ENCOUNTER — Telehealth: Payer: Self-pay | Admitting: Nurse Practitioner

## 2017-12-01 ENCOUNTER — Inpatient Hospital Stay: Payer: BC Managed Care – PPO | Attending: Oncology | Admitting: Oncology

## 2017-12-01 VITALS — BP 118/87 | HR 103 | Temp 99.2°F | Resp 18 | Ht 70.0 in | Wt 142.7 lb

## 2017-12-01 DIAGNOSIS — C20 Malignant neoplasm of rectum: Secondary | ICD-10-CM

## 2017-12-01 DIAGNOSIS — C7931 Secondary malignant neoplasm of brain: Secondary | ICD-10-CM

## 2017-12-01 DIAGNOSIS — Z5112 Encounter for antineoplastic immunotherapy: Secondary | ICD-10-CM | POA: Diagnosis not present

## 2017-12-01 DIAGNOSIS — R05 Cough: Secondary | ICD-10-CM | POA: Diagnosis not present

## 2017-12-01 DIAGNOSIS — Z95828 Presence of other vascular implants and grafts: Secondary | ICD-10-CM

## 2017-12-01 DIAGNOSIS — C787 Secondary malignant neoplasm of liver and intrahepatic bile duct: Secondary | ICD-10-CM

## 2017-12-01 DIAGNOSIS — Z5189 Encounter for other specified aftercare: Secondary | ICD-10-CM | POA: Diagnosis not present

## 2017-12-01 DIAGNOSIS — Z5111 Encounter for antineoplastic chemotherapy: Secondary | ICD-10-CM | POA: Insufficient documentation

## 2017-12-01 LAB — CMP (CANCER CENTER ONLY)
ALBUMIN: 3.5 g/dL (ref 3.5–5.0)
ALT: 14 U/L (ref 0–44)
ANION GAP: 9 (ref 5–15)
AST: 19 U/L (ref 15–41)
Alkaline Phosphatase: 162 U/L — ABNORMAL HIGH (ref 38–126)
BUN: 10 mg/dL (ref 6–20)
CHLORIDE: 104 mmol/L (ref 98–111)
CO2: 24 mmol/L (ref 22–32)
Calcium: 9.1 mg/dL (ref 8.9–10.3)
Creatinine: 0.85 mg/dL (ref 0.61–1.24)
GFR, Est AFR Am: >60 mL/min (ref >60)
Glucose, Bld: 131 mg/dL — ABNORMAL HIGH (ref 70–99)
POTASSIUM: 3.8 mmol/L (ref 3.5–5.1)
Sodium: 137 mmol/L (ref 135–145)
Total Bilirubin: 0.5 mg/dL (ref 0.3–1.2)
Total Protein: 6.7 g/dL (ref 6.5–8.1)

## 2017-12-01 LAB — CBC WITH DIFFERENTIAL (CANCER CENTER ONLY)
Basophils Absolute: 0 10*3/uL (ref 0.0–0.1)
Basophils Relative: 0 %
EOS PCT: 0 %
Eosinophils Absolute: 0 10*3/uL (ref 0.0–0.5)
HEMATOCRIT: 30.4 % — AB (ref 38.4–49.9)
HEMOGLOBIN: 9.4 g/dL — AB (ref 13.0–17.1)
LYMPHS ABS: 0.5 10*3/uL — AB (ref 0.9–3.3)
LYMPHS PCT: 7 %
MCH: 26.5 pg — ABNORMAL LOW (ref 27.2–33.4)
MCHC: 30.9 g/dL — ABNORMAL LOW (ref 32.0–36.0)
MCV: 85.6 fL (ref 79.3–98.0)
Monocytes Absolute: 0.9 10*3/uL (ref 0.1–0.9)
Monocytes Relative: 13 %
NEUTROS ABS: 5.7 10*3/uL (ref 1.5–6.5)
Neutrophils Relative %: 80 %
Platelet Count: 147 10*3/uL (ref 140–400)
RBC: 3.55 MIL/uL — AB (ref 4.20–5.82)
RDW: 18.2 % — ABNORMAL HIGH (ref 11.0–14.6)
WBC Count: 7.2 10*3/uL (ref 4.0–10.3)

## 2017-12-01 LAB — MAGNESIUM: MAGNESIUM: 2.1 mg/dL (ref 1.7–2.4)

## 2017-12-01 LAB — CEA (IN HOUSE-CHCC): CEA (CHCC-IN HOUSE): 66.32 ng/mL — AB (ref 0.00–5.00)

## 2017-12-01 MED ORDER — SODIUM CHLORIDE 0.9 % IJ SOLN
10.0000 mL | INTRAMUSCULAR | Status: DC | PRN
  Administered 2017-12-01: 10 mL via INTRAVENOUS
  Filled 2017-12-01: qty 10

## 2017-12-01 NOTE — Progress Notes (Signed)
[place] OFFICE PROGRESS NOTE   Diagnosis: Rectal cancer  INTERVAL HISTORY:   Mr. Grudzien completed a cycle of FOLFIRI/panitumumab 11/17/2017.  He reports developing constipation following chemotherapy.  This lasted 4-5 days and was followed by diarrhea and nausea/vomiting.  He feels weak.  The nausea and diarrhea have resolved.  He had minimal skin rash following this cycle of chemotherapy.  No nailbed changes.  He has an intermittent cough.  He notices a "clicking" sound when he breathes at night.  He has small amounts of blood-tinged sputum. No seizures or neurologic symptoms.  He noted a rapid heart rate following chemotherapy.  Objective:  Vital signs in last 24 hours:  Blood pressure 118/87, pulse (!) 103, temperature 99.2 F (37.3 C), temperature source Oral, resp. rate 18, height _0  (1.778 m), weight 142 lb 11.2 oz (64.7 kg), SpO2 100 %.    HEENT: No thrush or ulcers Resp: Decreased breath sounds at the right compared to the left chest, no respiratory distress Cardio: Regular rate and rhythm GI: No hepatomegaly, left lower quadrant colostomy, nontender Vascular: No leg edema  Skin: Minimal acne type rash at the anterior chest, no inflammation at the fingernails  Portacath/PICC-without erythema  Lab Results:  Lab Results  Component Value Date   WBC 7.2 12/01/2017   HGB 9.4 (L) 12/01/2017   HCT 30.4 (L) 12/01/2017   MCV 85.6 12/01/2017   PLT 147 12/01/2017   NEUTROABS 5.7 12/01/2017    CMP  Lab Results  Component Value Date   NA 137 12/01/2017   K 3.8 12/01/2017   CL 104 12/01/2017   CO2 24 12/01/2017   GLUCOSE 131 (H) 12/01/2017   BUN 10 12/01/2017   CREATININE 0.85 12/01/2017   CALCIUM 9.1 12/01/2017   PROT 6.7 12/01/2017   ALBUMIN 3.5 12/01/2017   AST 19 12/01/2017   ALT 14 12/01/2017   ALKPHOS 162 (H) 12/01/2017   BILITOT 0.5 12/01/2017   GFRNONAA >60 12/01/2017   GFRAA >60 12/01/2017    Lab Results  Component Value Date   CEA1 66.32 (H) 12/01/2017     Medications: I have reviewed the patient's current medications.   Assessment/Plan: 1. Rectal cancer. Partially obstructing mass noted 1-2 cm from the anal verge on a colonoscopy 03/06/2012. Endoscopic ultrasound 03/14/2012 with a 7.5 mm thick, 3.2 cm wide hypoechoic, irregularly bordered mass that clearly passed into and through the muscularis propria layer of the distal rectal wall (uT3); 3 small (largest 7 mm) perirectal lymph nodes. The lymph nodes were all round, discrete, hypoechoic, homogenous; suspicious for malignant involvement (uN1).  No RAS mutation identified by [facility] 1 testing on the colon resection specimen 06/29/2012, APC alteration identified, microsatellite stable  He began radiation and concurrent Xeloda chemotherapy on 03/20/2012, completed 04/27/2012.   Low anterior resection/coloanal anastomosis and diverting ileostomy 06/29/2012 with the final pathology revealing a T2N0 tumor with extensive fibrosis and negative margins.   Cycle 1 of adjuvant CAPOX 07/19/2012. Cycle 5 of adjuvant CAPOX 10/11/2012.   CEA 2.5 on 11/14/2012.   CEA 14.6 03/08/2013.   Restaging CT evaluation 03/08/2013 with a 4 mm pulmonary nodule left lung base not identified on comparison exam; new liver lesions including a 29 x 26 mm irregular peripheral enhancing rounded lesion in the dome of the right hepatic lobe, a less well-defined new subcapsular lesion in the lateral right hepatic lobe measuring 12 mm, a new subcapsular lesion in the anterior right hepatic lobe adjacent to the gallbladder fossa measuring 10 mm;  a rounded low-density lesion in the inferior right hepatic lobe measuring 10 mm compared to 7 mm on the prior study (radiologist commented this may represent an enlarged cyst).   MRI of the abdomen 04/12/2013 confirmed multiple T2 hyperintense metastatic lesions throughout the right liver   Initiation of FOLFIRI/Avastin with genotype based  irinotecan dosing per the [facility] study 04/05/2013.   Restaging CT evaluation on 06/20/2013 (after 2 cycles/4 treatments) showed improvement in the liver metastases and stable size of a left lower lobe pulmonary nodule now with central cavitation.   Continuation of FOLFIRI/Avastin.   Restaging CT evaluation 08/14/2013-decrease in the size of liver metastases, slight decrease in the size of a cavitary left lower lobe nodule, no evidence of disease progression.   Status post right hepatic lobectomy 09/17/2013. Pathology showed multiple foci of metastatic adenocarcinoma (5 nodules of metastatic adenocarcinoma 4 of which are subcapsular with the nodules ranging in size from 0.6-1.8 cm in greatest dimension). Margins not involved. Biopsy of a portal lymph node showed benign adipose tissue; no lymph node tissue or malignancy.   Normal CEA 11/06/2013   CT 11/06/2013 with a new pleural-based right lower chest lesion, slight in enlargement of the a left-sided lung nodule  CT 01/29/2014 with a decrease in the right lower chest pleural-based lesion and a slight increase of a left-sided lung lesion, other lung lesions were stable  CT chest 05/02/2014 with a decrease in the size of a right lower lobe pulmonary nodule months similar size of a dominant left lower lobe nodule, minimal enlargement of a smaller left lower lobe nodule, no new site of disease  CT chest 11/07/2014 with a slight increase in several left-sided lung nodules  CT chest 02/10/2015 with new small left hilar lymph nodes, a possible new left lower lobe nodule, and stability of other lung nodules  PET scan 03/12/2015 with hypermetabolic left hilar nodes, hypermetabolic left lower lobe nodules, hyper metabolic retroperitoneal nodes, intense hypermetabolism at the coloanal anastomosis, and hypermetabolic thickening in the presacral space  Cycle 1 FOLFIRI/PANITUMUMAB 05/21/2015  Cycle 2 FOLFIRI/PANITUMUMAB 06/05/2015  Cycle 3  FOLFIRI/PANITUMUMAB 06/19/2015  Cycle 4 FOLFIRI 07/10/2015  Cycle 5 FOLFIRI 07/24/2015  Restaging CTs 08/06/2015-resolution of hilar/retroperitoneal adenopathy, improvement in the hypermetabolic lung nodule, other lung nodules are stable, no new lesions  Cycle 6 FOLFIRI with PANITUMUMAB 08/28/2015 -panitumumab dose reduced  Cycle 7 FOLFIRI 09/11/2015-no panitumumab given  Cycle 8 FOLFIRI with PANITUMUMAB 09/25/2015-PANITUMUMAB dose reduced  Cycle 9 FOLFIRI 10/09/2015-no PANITUMUMAB given  Cycle 10 FOLFIRI with panitumumab 10/23/2015  CTs 11/06/2015-possible slight enlargement of a left lower lobe nodule, no other evidence of disease progression.  CTs 02/16/2016-enlargement of left-sided pulmonary nodules and mediastinal/left hilar nodes, improved splenomegaly  CTs 06/21/2016-increased size of mediastinal/left hilar nodes, increased left lung nodules, and increased soft tissue at the porta hepatis  CTs 10/28/2016-progressive disease in the chest and abdomen with an enlarging left hilar mass and slight interval enlargement of pulmonary lesions. Right upper quadrant necrotic nodal mass significantly increased in size with mass effect on the liver and possible invasion. Significant compression of the intrahepatic IVC. New right hepatic lobe lesion. Moderate pelvic ascites.  Cycle 1 FOLFIRI/PANITUMUMAB 11/04/2016  Cycle 2 FOLFIRI/PANITUMUMAB 11/18/2016  Cycle 3 FOLFIRI/panitumumab 12/02/2016  Cycle4 FOLFIRI/PANITUMUMAB 12/15/2016  Cycle 5 FOLFIRI/PANITUMUMAB 12/30/2016  CTs 01/06/2017-no evidence of progressive disease, decreased chest adenopathy, stable left lower lobe nodules, decreased liver metastasis, decreased right retroperitoneal mass  Cycle 6 FOLFIRI/panitumumab 01/13/2017  Cycle 7 FOLFIRI/Panitumumab 01/27/2017  Cycle 8 FOLFIRI/panitumumab 02/17/2017  Cycle  9 FOLFIRI/Panitumumab 03/03/2017  Cycle 10 FOLFIRI/Panitumumab 03/24/2017  CTs 04/06/2017-enlargement of right  adrenal mass (smaller than October 2019), stable left hilar fullness and lung nodules  Cycle 11 FOLFIRI/Panitumumab 04/07/2017  Cycle 12 FOLFIRI/panitumumab 04/21/2017  Cycle 13 FOLFIRI/Panitumumab 05/05/2017  Palliative radiation to the right adrenal mass 226 2019-3 02/2018  Brain MRI 07/28/2017-4intracranial metastasis in the right frontal lobe, left parietal lobe and left occipital lobe. Surrounding edema pronounced in the left occipital lobe.  SBRT to for brain lesions on 08/12/2017  CTs 08/10/2017- new left upper lobe airspace process, stable lung nodules and medius and lymphadenopathy, decreased mass involving the adrenal gland with progression of a necrotic anterior portion of the mass, no liver lesions  Cycle 14 FOLFIRI/panitumumab 08/19/2017, irinotecan dose reduced secondary to thrombocytopenia, Panitumumab dose escalated  Cycle 15 FOLFIRI/panitumumab 09/01/2017) Panitumumab dose reduced secondary to an early rash following cycle 14  CT chest 10/24/2017- progression of disease in the left lung; mild progression of left hilar nodal disease with new right hilar lymphadenopathy and progressive increase in lymph nodes in the mediastinum; interval progression of caudate lobe metastatic lesion.  Cycle 1 FOLFIRI/Panitumumab 11/03/2017  Cycle 2 FOLFIRI/Panitumumab 11/17/2017  Cycle 3 FOLFIRI/panitumumab 12/08/2017 (Panitumumab dose increased) 2. History ofIrregular bowel habits/rectal bleeding secondary to #1. 3. History of Mild elevation of the liver enzymes. Question secondary to hepatic steatosis. 4. Indeterminate 8 mm posterior right liver lesion on the staging CT 03/06/2012. 5. Mildly elevated CEA at 5.7 on 03/06/2012. 6. History of radiation erythema at the groin and perineum 7. 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 No clinical evidence of thrombophlebitis or extravasation. 8. Delayed nausea  following chemotherapy-Decadron prophylaxis was added with cycle 3 CAPOX-improved. 9. Ileostomy takedown 12/07/2012. 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 11. Port-A-Cath placement 12/31/204 12. Severe neutropenia secondary to chemotherapy following cycle 1 of FOLFIRIin 2015, chemotherapy was dose reduced and he received Neulasta with day 15 cycle 1  13. Nausea and vomiting following cycle 1 of FOLFIRI 14. Rectal stricture-manual/colonoscopic dilatation by Dr. Ardis Hughs 03/01/2016  APR 08/17/2016-no evidence of malignancy 15. History ofLeukopenia/Thrombocytopenia-persistent, potentially a sequelae of chemotherapy or hepatic toxicity from chemotherapy/radiation. Bone marrow biopsy 11/20/2014 showed cellular bone marrow with trilineage hematopoiesis. Significant dyspoiesis was not present and there was no evidence of metastatic carcinoma. Cytogenetic analysis showed the presence of normal male chromosomes with no observable clonal chromosomal abnormalities.  probable cirrhosis 16. Genetic testing-negative genetic panel in March 2014 17. 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 Severe over the face-steroid Dosepak prescribed 07/03/2015. Improved 07/10/2015. Further improved 07/24/2015, 08/07/2015, 08/28/2015 18. Diarrhea secondary to chemotherapy-encouraged to use Imodium  19. History of Paronychia secondary to PANITUMUMAB 20. 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 History of neutropenia, thrombocytopenia 06/23/2017. Possibly related to radiation. He also has a history of suspected portal hypertension secondaryto toxicity from chemotherap 22. Seizure in the setting of brain metastases 08/05/2017, now maintained onLamictal 23. Altered mental status evaluated by neuro-oncology, Keppra discontinued, improved    Disposition: His overall status appears unchanged.  He has completed 2  cycles of salvage therapy with FOLFIRI/panitumumab.  The most recent cycle of chemotherapy was complicated by alternating constipation and diarrhea.  He wishes to delay the next cycle of chemotherapy for 1 week.  He will be scheduled to return for chemotherapy on 12/08/2017.  He will let us know if he has persistent nausea or diarrhea prior to chemotherapy next  week.  He has experienced little skin toxicity from the reduced Panitumumab.  I will increase the dose with the next cycle.  Mr. Hillmer will be scheduled for an office visit and FOLFIRI/panitumumab 12/22/2017.  We will consider scheduling a restaging CT after cycle 4 or cycle 5.  25 minutes were spent with the patient today.  The majority of the time was used for counseling and coordination of care.  Betsy Coder, MD  12/01/2017  1:57 PM

## 2017-12-01 NOTE — Telephone Encounter (Signed)
Cancelled appt per 9/12 los - unable to schedule treatments due to cap day - logged - will call patient when apts scheduled

## 2017-12-08 ENCOUNTER — Inpatient Hospital Stay: Payer: BC Managed Care – PPO

## 2017-12-08 VITALS — BP 117/77 | HR 89 | Temp 98.0°F | Resp 18

## 2017-12-08 DIAGNOSIS — C787 Secondary malignant neoplasm of liver and intrahepatic bile duct: Principal | ICD-10-CM

## 2017-12-08 DIAGNOSIS — Z5112 Encounter for antineoplastic immunotherapy: Secondary | ICD-10-CM | POA: Diagnosis not present

## 2017-12-08 DIAGNOSIS — C20 Malignant neoplasm of rectum: Secondary | ICD-10-CM

## 2017-12-08 LAB — CBC WITH DIFFERENTIAL (CANCER CENTER ONLY)
BASOS PCT: 1 %
Basophils Absolute: 0 10*3/uL (ref 0.0–0.1)
EOS ABS: 0.1 10*3/uL (ref 0.0–0.5)
EOS PCT: 3 %
HCT: 27.9 % — ABNORMAL LOW (ref 38.4–49.9)
Hemoglobin: 8.9 g/dL — ABNORMAL LOW (ref 13.0–17.1)
LYMPHS ABS: 0.3 10*3/uL — AB (ref 0.9–3.3)
Lymphocytes Relative: 7 %
MCH: 26.7 pg — AB (ref 27.2–33.4)
MCHC: 32 g/dL (ref 32.0–36.0)
MCV: 83.2 fL (ref 79.3–98.0)
MONOS PCT: 9 %
Monocytes Absolute: 0.4 10*3/uL (ref 0.1–0.9)
Neutro Abs: 3.2 10*3/uL (ref 1.5–6.5)
Neutrophils Relative %: 80 %
Platelet Count: 140 10*3/uL (ref 140–400)
RBC: 3.35 MIL/uL — ABNORMAL LOW (ref 4.20–5.82)
RDW: 19.9 % — ABNORMAL HIGH (ref 11.0–14.6)
WBC Count: 4.1 10*3/uL (ref 4.0–10.3)

## 2017-12-08 MED ORDER — PALONOSETRON HCL INJECTION 0.25 MG/5ML
0.2500 mg | Freq: Once | INTRAVENOUS | Status: AC
  Administered 2017-12-08: 0.25 mg via INTRAVENOUS

## 2017-12-08 MED ORDER — SODIUM CHLORIDE 0.9 % IV SOLN
Freq: Once | INTRAVENOUS | Status: AC
  Administered 2017-12-08: 11:00:00 via INTRAVENOUS
  Filled 2017-12-08: qty 250

## 2017-12-08 MED ORDER — PALONOSETRON HCL INJECTION 0.25 MG/5ML
INTRAVENOUS | Status: AC
  Filled 2017-12-08: qty 5

## 2017-12-08 MED ORDER — ATROPINE SULFATE 1 MG/ML IJ SOLN: 0.5000 mg | Freq: Once | INTRAMUSCULAR | Status: DC | PRN

## 2017-12-08 MED ORDER — SODIUM CHLORIDE 0.9 % IV SOLN
3.0000 mg/kg | Freq: Once | INTRAVENOUS | Status: AC
  Administered 2017-12-08: 200 mg via INTRAVENOUS
  Filled 2017-12-08: qty 10

## 2017-12-08 MED ORDER — IRINOTECAN HCL CHEMO INJECTION 100 MG/5ML
100.0000 mg/m2 | Freq: Once | INTRAVENOUS | Status: AC
  Administered 2017-12-08: 180 mg via INTRAVENOUS
  Filled 2017-12-08: qty 9

## 2017-12-08 MED ORDER — LEUCOVORIN CALCIUM INJECTION 350 MG
400.0000 mg/m2 | Freq: Once | INTRAVENOUS | Status: AC
  Administered 2017-12-08: 736 mg via INTRAVENOUS
  Filled 2017-12-08: qty 36.8

## 2017-12-08 MED ORDER — SODIUM CHLORIDE 0.9 % IV SOLN
Freq: Once | INTRAVENOUS | Status: AC
  Administered 2017-12-08: 09:00:00 via INTRAVENOUS
  Filled 2017-12-08: qty 5

## 2017-12-08 MED ORDER — SODIUM CHLORIDE 0.9 % IV SOLN
2400.0000 mg/m2 | INTRAVENOUS | Status: DC
  Administered 2017-12-08: 4400 mg via INTRAVENOUS
  Filled 2017-12-08: qty 88

## 2017-12-08 MED ORDER — SODIUM CHLORIDE 0.9 % IV SOLN
Freq: Once | INTRAVENOUS | Status: AC
  Administered 2017-12-08: 09:00:00 via INTRAVENOUS
  Filled 2017-12-08: qty 250

## 2017-12-08 NOTE — Patient Instructions (Signed)
[place] Discharge Instructions for Patients Receiving Chemotherapy  Today you received the following chemotherapy agents:  Vectibix, Irinotecan, Leucovorin, and 5FU.  To help prevent nausea and vomiting after your treatment, we encourage you to take your nausea medication as directed.   If you develop nausea and vomiting that is not controlled by your nausea medication, call the clinic.   BELOW ARE SYMPTOMS THAT SHOULD BE REPORTED IMMEDIATELY:  *FEVER GREATER THAN 100.5 F  *CHILLS WITH OR WITHOUT FEVER  NAUSEA AND VOMITING THAT IS NOT CONTROLLED WITH YOUR NAUSEA MEDICATION  *UNUSUAL SHORTNESS OF BREATH  *UNUSUAL BRUISING OR BLEEDING  TENDERNESS IN MOUTH AND THROAT WITH OR WITHOUT PRESENCE OF ULCERS  *URINARY PROBLEMS  *BOWEL PROBLEMS  UNUSUAL RASH Items with * indicate a potential emergency and should be followed up as soon as possible.  Feel free to call the clinic should you have any questions or concerns. The clinic phone number is [phone].  Please show the [place] at check-in to the Emergency Department and triage nurse.

## 2017-12-08 NOTE — Progress Notes (Signed)
Ok to start premeds prior to labs resulting per Dr. Benay Spice.

## 2017-12-10 ENCOUNTER — Inpatient Hospital Stay: Payer: BC Managed Care – PPO

## 2017-12-10 VITALS — BP 121/86 | HR 95 | Temp 97.7°F | Resp 18

## 2017-12-10 DIAGNOSIS — Z5112 Encounter for antineoplastic immunotherapy: Secondary | ICD-10-CM | POA: Diagnosis not present

## 2017-12-10 DIAGNOSIS — C787 Secondary malignant neoplasm of liver and intrahepatic bile duct: Principal | ICD-10-CM

## 2017-12-10 DIAGNOSIS — C20 Malignant neoplasm of rectum: Secondary | ICD-10-CM

## 2017-12-10 MED ORDER — SODIUM CHLORIDE 0.9 % IJ SOLN
10.0000 mL | INTRAMUSCULAR | Status: DC | PRN
  Administered 2017-12-10: 10 mL
  Filled 2017-12-10: qty 10

## 2017-12-10 MED ORDER — PEGFILGRASTIM-CBQV 6 MG/0.6ML ~~LOC~~ SOSY
6.0000 mg | PREFILLED_SYRINGE | Freq: Once | SUBCUTANEOUS | Status: AC
  Administered 2017-12-10: 6 mg via SUBCUTANEOUS

## 2017-12-10 MED ORDER — PEGFILGRASTIM-CBQV 6 MG/0.6ML ~~LOC~~ SOSY
PREFILLED_SYRINGE | SUBCUTANEOUS | Status: AC
  Filled 2017-12-10: qty 0.6

## 2017-12-10 MED ORDER — HEPARIN SOD (PORK) LOCK FLUSH 100 UNIT/ML IV SOLN
500.0000 [IU] | Freq: Once | INTRAVENOUS | Status: AC | PRN
  Administered 2017-12-10: 500 [IU]
  Filled 2017-12-10: qty 5

## 2017-12-10 NOTE — Patient Instructions (Signed)
Pegfilgrastim injection What is this medicine? PEGFILGRASTIM (PEG fil gra stim) is a long-acting granulocyte colony-stimulating factor that stimulates the growth of neutrophils, a type of white blood cell important in the body's fight against infection. It is used to reduce the incidence of fever and infection in patients with certain types of cancer who are receiving chemotherapy that affects the bone marrow, and to increase survival after being exposed to high doses of radiation. This medicine may be used for other purposes; ask your health care provider or pharmacist if you have questions. COMMON BRAND NAME(S): Neulasta What should I tell my health care provider before I take this medicine? They need to know if you have any of these conditions: -kidney disease -latex allergy -ongoing radiation therapy -sickle cell disease -skin reactions to acrylic adhesives (On-Body Injector only) -an unusual or allergic reaction to pegfilgrastim, filgrastim, other medicines, foods, dyes, or preservatives -pregnant or trying to get pregnant -breast-feeding How should I use this medicine? This medicine is for injection under the skin. If you get this medicine at home, you will be taught how to prepare and give the pre-filled syringe or how to use the On-body Injector. Refer to the patient Instructions for Use for detailed instructions. Use exactly as directed. Tell your healthcare provider immediately if you suspect that the On-body Injector may not have performed as intended or if you suspect the use of the On-body Injector resulted in a missed or partial dose. It is important that you put your used needles and syringes in a special sharps container. Do not put them in a trash can. If you do not have a sharps container, call your pharmacist or healthcare provider to get one. Talk to your pediatrician regarding the use of this medicine in children. While this drug may be prescribed for selected conditions,  precautions do apply. Overdosage: If you think you have taken too much of this medicine contact a poison control center or emergency room at once. NOTE: This medicine is only for you. Do not share this medicine with others. What if I miss a dose? It is important not to miss your dose. Call your doctor or health care professional if you miss your dose. If you miss a dose due to an On-body Injector failure or leakage, a new dose should be administered as soon as possible using a single prefilled syringe for manual use. What may interact with this medicine? Interactions have not been studied. Give your health care provider a list of all the medicines, herbs, non-prescription drugs, or dietary supplements you use. Also tell them if you smoke, drink alcohol, or use illegal drugs. Some items may interact with your medicine. This list may not describe all possible interactions. Give your health care provider a list of all the medicines, herbs, non-prescription drugs, or dietary supplements you use. Also tell them if you smoke, drink alcohol, or use illegal drugs. Some items may interact with your medicine. What should I watch for while using this medicine? You may need blood work done while you are taking this medicine. If you are going to need a MRI, CT scan, or other procedure, tell your doctor that you are using this medicine (On-Body Injector only). What side effects may I notice from receiving this medicine? Side effects that you should report to your doctor or health care professional as soon as possible: -allergic reactions like skin rash, itching or hives, swelling of the face, lips, or tongue -dizziness -fever -pain, redness, or irritation at site   where injected -pinpoint red spots on the skin -red or dark-brown urine -shortness of breath or breathing problems -stomach or side pain, or pain at the shoulder -swelling -tiredness -trouble passing urine or change in the amount of urine Side  effects that usually do not require medical attention (report to your doctor or health care professional if they continue or are bothersome): -bone pain -muscle pain This list may not describe all possible side effects. Call your doctor for medical advice about side effects. You may report side effects to FDA at 1-800-FDA-1088. Where should I keep my medicine? Keep out of the reach of children. Store pre-filled syringes in a refrigerator between 2 and 8 degrees C (36 and 46 degrees F). Do not freeze. Keep in carton to protect from light. Throw away this medicine if it is left out of the refrigerator for more than 48 hours. Throw away any unused medicine after the expiration date. NOTE: This sheet is a summary. It may not cover all possible information. If you have questions about this medicine, talk to your doctor, pharmacist, or health care provider.  2018 Elsevier/Gold Standard (2016-03-04 12:58:03)  

## 2017-12-14 ENCOUNTER — Other Ambulatory Visit: Payer: BC Managed Care – PPO

## 2017-12-14 ENCOUNTER — Ambulatory Visit: Payer: BC Managed Care – PPO | Admitting: Nurse Practitioner

## 2017-12-15 ENCOUNTER — Other Ambulatory Visit: Payer: BC Managed Care – PPO

## 2017-12-15 ENCOUNTER — Ambulatory Visit: Payer: BC Managed Care – PPO

## 2017-12-15 ENCOUNTER — Ambulatory Visit: Payer: BC Managed Care – PPO | Admitting: Nurse Practitioner

## 2017-12-19 ENCOUNTER — Encounter: Payer: Self-pay | Admitting: Family Medicine

## 2017-12-19 ENCOUNTER — Ambulatory Visit (INDEPENDENT_AMBULATORY_CARE_PROVIDER_SITE_OTHER): Payer: BC Managed Care – PPO | Admitting: Family Medicine

## 2017-12-19 VITALS — BP 108/70 | HR 89 | Temp 98.2°F | Resp 18 | Wt 138.8 lb

## 2017-12-19 DIAGNOSIS — D649 Anemia, unspecified: Secondary | ICD-10-CM | POA: Diagnosis not present

## 2017-12-19 DIAGNOSIS — K5900 Constipation, unspecified: Secondary | ICD-10-CM | POA: Insufficient documentation

## 2017-12-19 DIAGNOSIS — E871 Hypo-osmolality and hyponatremia: Secondary | ICD-10-CM

## 2017-12-19 DIAGNOSIS — R0600 Dyspnea, unspecified: Secondary | ICD-10-CM | POA: Diagnosis not present

## 2017-12-19 DIAGNOSIS — D72819 Decreased white blood cell count, unspecified: Secondary | ICD-10-CM | POA: Diagnosis not present

## 2017-12-19 MED ORDER — DRONABINOL 5 MG PO CAPS: 5.0000 mg | ORAL_CAPSULE | Freq: Three times a day (TID) | ORAL | 1 refills | Status: DC | PRN

## 2017-12-19 MED ORDER — DRONABINOL 5 MG PO CAPS: 5.0000 mg | ORAL_CAPSULE | Freq: Three times a day (TID) | ORAL | 3 refills | Status: DC | PRN

## 2017-12-19 NOTE — Progress Notes (Signed)
Subjective:    Patient ID: Jacob [name], male    DOB: 1970/03/28, 47 y.o.   MRN: [id number]  No chief complaint on file.   HPI Patient is in today for follow up. He feels the Marinol is helping his appetite and says overall he is eating better. He thinks his weight is down because of the constipation he suffers as his chemo treatments. No bloody stool. Denies CP/palp/SOB/HA/congestion/fevers or GU c/o. Taking meds as prescribed  Past Medical History:  Diagnosis Date  . Allergic state 01/19/2012  . 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 Chicken pox as a child   ?  Marland Kitchen Dysrhythmia as child   HX OF IRREGULAR HB AT TIME OF TONSILLECTOMY - SURGERY WAS NOT DONE-CAN'T REMEMBER ANY OTHER DETAILS- NEVER HAD THE SURGERY.  . Elevated liver function tests 01/19/2012  . Fatty liver   . Hernia 6 months old  . History of diarrhea    better since chemo finished  . Hx of migraines   . Lung abnormality 2015  . Lung nodule 08/2013  . Lung nodule, multiple 11/29/2013  . Migraine headache as a child  . Numbness    TOES OF BOTH FEET.  Marland Kitchen Overweight(278.02) 01/19/2012  . Pain    LEFT HEEL PAIN -SEVERE ESPECIALLY AFTER SITTING OR LYING DOWN - DIFFICULT TO GET OUT OF BED AND WALK IN THE MORNINGS BECAUSE OF HEEL PAIN  . 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 Preventative health care 01/19/2012  . Radiation 03/20/12-04/27/12   Pelvis 50.4 gray Rectal cancer  . 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 Reflux 01/19/2012  . Sun-damaged skin 01/19/2012    Past Surgical History:  Procedure Laterality Date  . CHOLECYSTECTOMY N/A 09/17/2013   Procedure: CHOLECYSTECTOMY;  Surgeon: Stark Klein, MD;  Location: [place];  Service: General;  Laterality: N/A;  . COLOSTOMY TAKEDOWN N/A 08/17/2016   Procedure: LAPAROSCOPIC ABDOMINOPERINEAL RESECTION WITH PERMANENT COLOSTOMY;  Surgeon: Leighton Ruff, MD;   Location: [facility];  Service: General;  Laterality: N/A;  . CYST EXCISION     L EAR AREA  . EUS  03/14/2012   Procedure: LOWER ENDOSCOPIC ULTRASOUND (EUS);  Surgeon: Milus Banister, MD;  Location: [facility];  Service: Endoscopy;  Laterality: N/A;  . HERNIA REPAIR  6 months old   right inguinal repair  . ILEOSTOMY CLOSURE N/A 12/07/2012   Procedure: ILEOSTOMY TAKEDOWN;  Surgeon: Leighton Ruff, MD;  Location: [facility];  Service: General;  Laterality: N/A;  . LAPAROSCOPIC LOW ANTERIOR RESECTION N/A 06/29/2012   Procedure: LAPAROSCOPIC LOW ANTERIOR RESECTION, mobilization splenic flexure,coloanal anastomosis,diverting ileostomy;  Surgeon: Leighton Ruff, MD;  Location: [facility];  Service: General;  Laterality: N/A;  . LAPAROSCOPY N/A 09/17/2013   Procedure: LAPAROSCOPY DIAGNOSTIC;  Surgeon: Stark Klein, MD;  Location: [place];  Service: General;  Laterality: N/A;  . LIVER ULTRASOUND N/A 09/17/2013   Procedure: LIVER ULTRASOUND;  Surgeon: Stark Klein, MD;  Location: [place];  Service: General;  Laterality: N/A;  . MICROLARYNGOSCOPY W/VOCAL CORD INJECTION Left 05/16/2017   Procedure: MICROLARYNGOSCOPY WITH VOCAL CORD INJECTION;  Surgeon: Melissa Montane, MD;  Location: [place];  Service: ENT;  Laterality: Left;  . OPEN HEPATECTOMY  N/A 09/17/2013   Procedure: OPEN HEPATECTOMY;  Surgeon: Stark Klein, MD;  Location: [place];  Service: General;  Laterality: N/A;  . PORTACATH PLACEMENT Left 03/21/2013   Procedure:  INSERTION PORT-A-CATH;  Surgeon: Leighton Ruff, MD;  Location: [facility];  Service: General;  Laterality: Left;  . RECTAL BIOPSY  03/06/12   distal mass1-2cm from anal verge=Adenocarcinoma    Family History  Adopted: Yes  Problem Relation Age of Onset  . Cancer Father   . Heart disease Father   . Colon cancer Neg Hx     Social History   Socioeconomic History  . Marital status: Married    Spouse name: Not on file  . Number of children: 1  . Years of education: Not on file  .  Highest education level: Not on file  Occupational History  . Occupation: Midwife: [employer]    Comment: [employer]  Social Needs  . Financial resource strain: Not on file  . Food insecurity:    Worry: Not on file    Inability: Not on file  . Transportation needs:    Medical: Not on file    Non-medical: Not on file  Tobacco Use  . Smoking status: Never Smoker  . Smokeless tobacco: Never Used  Substance and Sexual Activity  . Alcohol use: Yes    Alcohol/week: 0.0 standard drinks    Comment: RARE   . Drug use: No    Comment: marijuana past  . Sexual activity: Yes    Partners: Female  Lifestyle  . Physical activity:    Days per week: Not on file    Minutes per session: Not on file  . Stress: Not on file  Relationships  . Social connections:    Talks on phone: Not on file    Gets together: Not on file    Attends religious service: Not on file    Active member of club or organization: Not on file    Attends meetings of clubs or organizations: Not on file    Relationship status: Not on file  . Intimate partner violence:    Fear of current or ex partner: Not on file    Emotionally abused: Not on file    Physically abused: Not on file    Forced sexual activity: Not on file  Other Topics Concern  . Not on file  Social History Narrative   Married to wife, [name]   Has one 32 year old child   Occupation: Teaches metal sculpture at [employer]    Outpatient Medications Prior to Visit  Medication Sig Dispense Refill  . docusate sodium (STOOL SOFTENER) 100 MG capsule Take 100-200 mg by mouth daily as needed (constipation).     Marland Kitchen lamoTRIgine (LAMICTAL) 100 MG tablet Take 1 tablet (100 mg total) by mouth 2 (two) times daily. 60 tablet 3  . prochlorperazine (COMPAZINE) 10 MG tablet Take 10 mg by mouth every 6 (six) hours as needed for nausea or vomiting.    Marland Kitchen tiZANidine (ZANAFLEX) 4 MG tablet Take 0.5-1 tablets (2-4 mg total) by mouth every 8 (eight) hours as needed for  muscle spasms. 30 tablet 1  . dronabinol (MARINOL) 5 MG capsule Take 1 capsule (5 mg total) by mouth 3 (three) times daily as needed. 90 capsule 1   Facility-Administered Medications Prior to Visit  Medication Dose Route Frequency Provider Last Rate Last Dose  . atropine injection 0.5 mg  0.5 mg Intravenous Once PRN Ladell Pier, MD        Allergies  Allergen Reactions  . Alprazolam Other (See Comments)    Excessive sedation  . Penicillins Rash    Childhood allergy Has patient had  a PCN reaction causing immediate rash, facial/tongue/throat swelling, SOB or lightheadedness with hypotension: Unknown Has patient had a PCN reaction causing severe rash involving mucus membranes or skin necrosis: Unknown Has patient had a PCN reaction that required hospitalization: No Has patient had a PCN reaction occurring within the last 10 years: No If all of the above answers are "NO", then may proceed with Cephalosporin use.     Review of Systems  Constitutional: Positive for malaise/fatigue. Negative for fever.  HENT: Negative for congestion.   Eyes: Negative for blurred vision and double vision.  Respiratory: Negative for shortness of breath.   Cardiovascular: Negative for chest pain, palpitations and leg swelling.  Gastrointestinal: Positive for constipation. Negative for abdominal pain, blood in stool and nausea.  Genitourinary: Negative for dysuria and frequency.  Musculoskeletal: Positive for back pain. Negative for falls.  Skin: Negative for rash.  Neurological: Positive for weakness. Negative for dizziness, loss of consciousness and headaches.  Endo/Heme/Allergies: Negative for environmental allergies.  Psychiatric/Behavioral: Negative for depression. The patient is not nervous/anxious.        Objective:    Physical Exam  Constitutional: He is oriented to person, place, and time. He appears well-developed and well-nourished. No distress.  HENT:  Head: Normocephalic and  atraumatic.  Nose: Nose normal.  Eyes: Right eye exhibits no discharge. Left eye exhibits no discharge.  Neck: Normal range of motion. Neck supple.  Cardiovascular: Normal rate and regular rhythm.  No murmur heard. Pulmonary/Chest: Effort normal and breath sounds normal.  Abdominal: Soft. Bowel sounds are normal. There is no tenderness.  Musculoskeletal: He exhibits no edema.  Neurological: He is alert and oriented to person, place, and time.  Skin: Skin is warm and dry.  Psychiatric: He has a normal mood and affect.  Nursing note and vitals reviewed.   BP 108/70 (BP Location: Left Arm, Patient Position: Sitting, Cuff Size: Normal)   Pulse 89   Temp 98.2 F (36.8 C) (Oral)   Resp 18   Wt 138 lb 12.8 oz (63 kg)   SpO2 100%   BMI 19.92 kg/m  Wt Readings from Last 3 Encounters:  12/19/17 138 lb 12.8 oz (63 kg)  12/01/17 142 lb 11.2 oz (64.7 kg)  11/18/17 146 lb (66.2 kg)     Lab Results  Component Value Date   WBC 4.1 12/08/2017   HGB 8.9 (L) 12/08/2017   HCT 27.9 (L) 12/08/2017   PLT 140 12/08/2017   GLUCOSE 131 (H) 12/01/2017   ALT 14 12/01/2017   AST 19 12/01/2017   NA 137 12/01/2017   K 3.8 12/01/2017   CL 104 12/01/2017   CREATININE 0.85 12/01/2017   BUN 10 12/01/2017   CO2 24 12/01/2017   TSH 2.66 11/07/2017   INR 1.05 11/20/2014   HGBA1C 5.1 11/07/2017    Lab Results  Component Value Date   TSH 2.66 11/07/2017   Lab Results  Component Value Date   WBC 4.1 12/08/2017   HGB 8.9 (L) 12/08/2017   HCT 27.9 (L) 12/08/2017   MCV 83.2 12/08/2017   PLT 140 12/08/2017   Lab Results  Component Value Date   NA 137 12/01/2017   K 3.8 12/01/2017   CHLORIDE 106 03/24/2017   CO2 24 12/01/2017   GLUCOSE 131 (H) 12/01/2017   BUN 10 12/01/2017   CREATININE 0.85 12/01/2017   BILITOT 0.5 12/01/2017   ALKPHOS 162 (H) 12/01/2017   AST 19 12/01/2017   ALT 14 12/01/2017   PROT 6.7 12/01/2017  ALBUMIN 3.5 12/01/2017   CALCIUM 9.1 12/01/2017   ANIONGAP 9  12/01/2017   EGFR >60 03/24/2017   GFR 115.17 11/07/2017   No results found for: CHOL No results found for: HDL No results found for: LDLCALC No results found for: TRIG No results found for: CHOLHDL Lab Results  Component Value Date   HGBA1C 5.1 11/07/2017       Assessment & Plan:   Problem List Items Addressed This Visit    Anemia    His anemia persists, he is following with hematology regularly      Leukopenia    Stable.       Hyponatremia    Resolved. Will continue to monitor      Constipation    He notes a flare after his chemo treatments. Encouraged increased hydration and fiber in diet. Daily probiotics. If bowels not moving can use MOM 2 tbls po in 4 oz of warm prune juice by mouth every 2-3 days. If no results then repeat in 4 hours with  Dulcolax suppository pr, may repeat again in 4 more hours as needed. Seek care if symptoms worsen. Consider daily Miralax and/or Dulcolax if symptoms persist. Add Miralax with Benefiber twice daily.       Other Visit Diagnoses    Dyspnea, unspecified type    -  Primary   Relevant Orders   ECHOCARDIOGRAM COMPLETE      I am having Renold Genta maintain his docusate sodium, tiZANidine, lamoTRIgine, prochlorperazine, and dronabinol.  Meds ordered this encounter  Medications  . DISCONTD: dronabinol (MARINOL) 5 MG capsule    Sig: Take 1 capsule (5 mg total) by mouth 3 (three) times daily as needed.    Dispense:  90 capsule    Refill:  1  . dronabinol (MARINOL) 5 MG capsule    Sig: Take 1 capsule (5 mg total) by mouth 3 (three) times daily as needed.    Dispense:  90 capsule    Refill:  3     Penni Homans, MD

## 2017-12-19 NOTE — Patient Instructions (Signed)
Encouraged increased hydration and fiber in diet. Daily probiotics. If bowels not moving can use MOM 2 tbls po in 4 oz of warm prune juice by mouth every 2-3 days. If no results then repeat in 4 hours with  Dulcolax suppository pr, may repeat again in 4 more hours as needed. Seek care if symptoms worsen. Consider daily Miralax and/or Dulcolax if symptoms persist.   Miralax and Benefiber once or twice daily Constipation, Adult Constipation is when a person has fewer bowel movements in a week than normal, has difficulty having a bowel movement, or has stools that are dry, hard, or larger than normal. Constipation may be caused by an underlying condition. It may become worse with age if a person takes certain medicines and does not take in enough fluids. Follow these instructions at home: Eating and drinking   Eat foods that have a lot of fiber, such as fresh fruits and vegetables, whole grains, and beans.  Limit foods that are high in fat, low in fiber, or overly processed, such as french fries, hamburgers, cookies, candies, and soda.  Drink enough fluid to keep your urine clear or pale yellow. General instructions  Exercise regularly or as told by your health care provider.  Go to the restroom when you have the urge to go. Do not hold it in.  Take over-the-counter and prescription medicines only as told by your health care provider. These include any fiber supplements.  Practice pelvic floor retraining exercises, such as deep breathing while relaxing the lower abdomen and pelvic floor relaxation during bowel movements.  Watch your condition for any changes.  Keep all follow-up visits as told by your health care provider. This is important. Contact a health care provider if:  You have pain that gets worse.  You have a fever.  You do not have a bowel movement after 4 days.  You vomit.  You are not hungry.  You lose weight.  You are bleeding from the anus.  You have thin,  pencil-like stools. Get help right away if:  You have a fever and your symptoms suddenly get worse.  You leak stool or have blood in your stool.  Your abdomen is bloated.  You have severe pain in your abdomen.  You feel dizzy or you faint. This information is not intended to replace advice given to you by your health care provider. Make sure you discuss any questions you have with your health care provider. Document Released: 12/05/2003 Document Revised: 09/26/2015 Document Reviewed: 08/27/2015 Elsevier Interactive Patient Education  2018 [employer].

## 2017-12-19 NOTE — Assessment & Plan Note (Signed)
Stable

## 2017-12-19 NOTE — Assessment & Plan Note (Signed)
He notes a flare after his chemo treatments. Encouraged increased hydration and fiber in diet. Daily probiotics. If bowels not moving can use MOM 2 tbls po in 4 oz of warm prune juice by mouth every 2-3 days. If no results then repeat in 4 hours with  Dulcolax suppository pr, may repeat again in 4 more hours as needed. Seek care if symptoms worsen. Consider daily Miralax and/or Dulcolax if symptoms persist. Add Miralax with Benefiber twice daily.

## 2017-12-19 NOTE — Assessment & Plan Note (Signed)
Resolved Will continue to monitor 

## 2017-12-19 NOTE — Assessment & Plan Note (Signed)
His anemia persists, he is following with hematology regularly

## 2017-12-20 ENCOUNTER — Other Ambulatory Visit: Payer: Self-pay | Admitting: Oncology

## 2017-12-20 DIAGNOSIS — Z23 Encounter for immunization: Secondary | ICD-10-CM

## 2017-12-22 ENCOUNTER — Inpatient Hospital Stay: Payer: BC Managed Care – PPO

## 2017-12-22 ENCOUNTER — Inpatient Hospital Stay (HOSPITAL_BASED_OUTPATIENT_CLINIC_OR_DEPARTMENT_OTHER): Payer: BC Managed Care – PPO | Admitting: Nurse Practitioner

## 2017-12-22 ENCOUNTER — Inpatient Hospital Stay: Payer: BC Managed Care – PPO | Attending: Oncology

## 2017-12-22 ENCOUNTER — Encounter: Payer: Self-pay | Admitting: Nurse Practitioner

## 2017-12-22 ENCOUNTER — Telehealth: Payer: Self-pay | Admitting: Nurse Practitioner

## 2017-12-22 VITALS — BP 123/83 | HR 88 | Temp 97.7°F | Resp 18 | Ht 70.0 in | Wt 141.6 lb

## 2017-12-22 DIAGNOSIS — Z5111 Encounter for antineoplastic chemotherapy: Secondary | ICD-10-CM | POA: Diagnosis present

## 2017-12-22 DIAGNOSIS — Z5189 Encounter for other specified aftercare: Secondary | ICD-10-CM | POA: Insufficient documentation

## 2017-12-22 DIAGNOSIS — Z5112 Encounter for antineoplastic immunotherapy: Secondary | ICD-10-CM | POA: Diagnosis not present

## 2017-12-22 DIAGNOSIS — C787 Secondary malignant neoplasm of liver and intrahepatic bile duct: Principal | ICD-10-CM

## 2017-12-22 DIAGNOSIS — C20 Malignant neoplasm of rectum: Secondary | ICD-10-CM

## 2017-12-22 DIAGNOSIS — C7931 Secondary malignant neoplasm of brain: Secondary | ICD-10-CM

## 2017-12-22 DIAGNOSIS — R21 Rash and other nonspecific skin eruption: Secondary | ICD-10-CM | POA: Diagnosis not present

## 2017-12-22 DIAGNOSIS — K5900 Constipation, unspecified: Secondary | ICD-10-CM

## 2017-12-22 DIAGNOSIS — Z95828 Presence of other vascular implants and grafts: Secondary | ICD-10-CM

## 2017-12-22 LAB — CBC WITH DIFFERENTIAL (CANCER CENTER ONLY)
BASOS PCT: 0 %
Basophils Absolute: 0 10*3/uL (ref 0.0–0.1)
EOS PCT: 3 %
Eosinophils Absolute: 0.2 10*3/uL (ref 0.0–0.5)
HEMATOCRIT: 31.1 % — AB (ref 38.4–49.9)
Hemoglobin: 10 g/dL — ABNORMAL LOW (ref 13.0–17.1)
Lymphocytes Relative: 5 %
Lymphs Abs: 0.3 10*3/uL — ABNORMAL LOW (ref 0.9–3.3)
MCH: 27 pg — ABNORMAL LOW (ref 27.2–33.4)
MCHC: 32.3 g/dL (ref 32.0–36.0)
MCV: 83.6 fL (ref 79.3–98.0)
MONO ABS: 0.6 10*3/uL (ref 0.1–0.9)
MONOS PCT: 9 %
NEUTROS ABS: 5.4 10*3/uL (ref 1.5–6.5)
Neutrophils Relative %: 83 %
PLATELETS: 153 10*3/uL (ref 140–400)
RBC: 3.72 MIL/uL — ABNORMAL LOW (ref 4.20–5.82)
RDW: 22.1 % — AB (ref 11.0–14.6)
WBC Count: 6.6 10*3/uL (ref 4.0–10.3)

## 2017-12-22 LAB — CMP (CANCER CENTER ONLY)
ALBUMIN: 3.5 g/dL (ref 3.5–5.0)
ALT: 12 U/L (ref 0–44)
ANION GAP: 7 (ref 5–15)
AST: 16 U/L (ref 15–41)
Alkaline Phosphatase: 146 U/L — ABNORMAL HIGH (ref 38–126)
BILIRUBIN TOTAL: 0.4 mg/dL (ref 0.3–1.2)
BUN: 10 mg/dL (ref 6–20)
CO2: 25 mmol/L (ref 22–32)
Calcium: 9.1 mg/dL (ref 8.9–10.3)
Chloride: 109 mmol/L (ref 98–111)
Creatinine: 0.79 mg/dL (ref 0.61–1.24)
GFR, Est AFR Am: >60 mL/min (ref >60)
GFR, Estimated: >60 mL/min (ref >60)
GLUCOSE: 120 mg/dL — AB (ref 70–99)
POTASSIUM: 3.8 mmol/L (ref 3.5–5.1)
Sodium: 141 mmol/L (ref 135–145)
TOTAL PROTEIN: 6.4 g/dL — AB (ref 6.5–8.1)

## 2017-12-22 LAB — MAGNESIUM: MAGNESIUM: 2 mg/dL (ref 1.7–2.4)

## 2017-12-22 LAB — CEA (IN HOUSE-CHCC): CEA (CHCC-IN HOUSE): 48.19 ng/mL — AB (ref 0.00–5.00)

## 2017-12-22 MED ORDER — LEUCOVORIN CALCIUM INJECTION 350 MG
400.0000 mg/m2 | Freq: Once | INTRAVENOUS | Status: AC
  Administered 2017-12-22: 736 mg via INTRAVENOUS
  Filled 2017-12-22: qty 36.8

## 2017-12-22 MED ORDER — IRINOTECAN HCL CHEMO INJECTION 100 MG/5ML
100.0000 mg/m2 | Freq: Once | INTRAVENOUS | Status: AC
  Administered 2017-12-22: 180 mg via INTRAVENOUS
  Filled 2017-12-22: qty 9

## 2017-12-22 MED ORDER — PALONOSETRON HCL INJECTION 0.25 MG/5ML
INTRAVENOUS | Status: AC
  Filled 2017-12-22: qty 5

## 2017-12-22 MED ORDER — PALONOSETRON HCL INJECTION 0.25 MG/5ML
0.2500 mg | Freq: Once | INTRAVENOUS | Status: AC
  Administered 2017-12-22: 0.25 mg via INTRAVENOUS

## 2017-12-22 MED ORDER — SODIUM CHLORIDE 0.9 % IJ SOLN
10.0000 mL | INTRAMUSCULAR | Status: DC | PRN
  Filled 2017-12-22: qty 10

## 2017-12-22 MED ORDER — SODIUM CHLORIDE 0.9 % IV SOLN
2400.0000 mg/m2 | INTRAVENOUS | Status: DC
  Administered 2017-12-22: 4400 mg via INTRAVENOUS
  Filled 2017-12-22: qty 88

## 2017-12-22 MED ORDER — SODIUM CHLORIDE 0.9 % IJ SOLN
10.0000 mL | INTRAMUSCULAR | Status: DC | PRN
  Administered 2017-12-22: 10 mL via INTRAVENOUS
  Filled 2017-12-22: qty 10

## 2017-12-22 MED ORDER — SODIUM CHLORIDE 0.9 % IV SOLN
3.0000 mg/kg | Freq: Once | INTRAVENOUS | Status: AC
  Administered 2017-12-22: 200 mg via INTRAVENOUS
  Filled 2017-12-22: qty 10

## 2017-12-22 MED ORDER — SODIUM CHLORIDE 0.9 % IV SOLN
INTRAVENOUS | Status: DC
  Administered 2017-12-22: 14:00:00 via INTRAVENOUS
  Filled 2017-12-22: qty 250

## 2017-12-22 MED ORDER — SODIUM CHLORIDE 0.9 % IV SOLN
Freq: Once | INTRAVENOUS | Status: AC
  Administered 2017-12-22: 11:00:00 via INTRAVENOUS
  Filled 2017-12-22: qty 250

## 2017-12-22 MED ORDER — SODIUM CHLORIDE 0.9 % IV SOLN
Freq: Once | INTRAVENOUS | Status: AC
  Administered 2017-12-22: 11:00:00 via INTRAVENOUS
  Filled 2017-12-22: qty 5

## 2017-12-22 NOTE — Telephone Encounter (Signed)
Scheduled appt per 10/3 los - left message for patient with appt date and  Time

## 2017-12-22 NOTE — Progress Notes (Signed)
[place] OFFICE PROGRESS NOTE   Diagnosis: Rectal cancer  INTERVAL HISTORY:   [name] returns as scheduled.  He completed cycle 3 FOLFIRI/Panitumumab 12/08/2017.  He had some nausea.  He had a single sore on his tongue which has resolved.  He was able to eat and drink without difficulty. He again developed constipation.  He plans to begin MiraLAX today.  The skin rash increased and has now improved.  No irritation involving the nailbeds.  Appetite varies.  He notes dyspnea is better.  Objective:  Vital signs in last 24 hours:  Blood pressure 123/83, pulse 88, temperature 97.7 F (36.5 C), temperature source Oral, resp. rate 18, height '5\' 10"'  (1.778 m), weight 141 lb 9.6 oz (64.2 kg), SpO2 100 %.    HEENT: No thrush or ulcers. Resp: Lungs clear bilaterally. Cardio: Regular rate and rhythm. GI: Abdomen soft and nontender.  No hepatomegaly.  Left lower quadrant colostomy. Vascular: No leg edema. Neuro: Alert and oriented. Skin: Acne type rash present mildly on the face and upper back, more so on the anterior chest.  No inflammation at the fingernails. Port-A-Cath without erythema.   Lab Results:  Lab Results  Component Value Date   WBC 6.6 12/22/2017   HGB 10.0 (L) 12/22/2017   HCT 31.1 (L) 12/22/2017   MCV 83.6 12/22/2017   PLT 153 12/22/2017   NEUTROABS 5.4 12/22/2017    Imaging:  No results found.  Medications: I have reviewed the patient's current medications.  Assessment/Plan: 1. Rectal cancer. Partially obstructing mass noted 1-2 cm from the anal verge on a colonoscopy 03/06/2012. Endoscopic ultrasound 03/14/2012 with a 7.5 mm thick, 3.2 cm wide hypoechoic, irregularly bordered mass that clearly passed into and through the muscularis propria layer of the distal rectal wall (uT3); 3 small (largest 7 mm) perirectal lymph nodes. The lymph nodes were all round, discrete, hypoechoic, homogenous; suspicious for malignant involvement (uN1).  No RAS  mutation identified by [facility] 1 testing on the colon resection specimen 06/29/2012, APC alteration identified, microsatellite stable  He began radiation and concurrent Xeloda chemotherapy on 03/20/2012, completed 04/27/2012.   Low anterior resection/coloanal anastomosis and diverting ileostomy 06/29/2012 with the final pathology revealing a T2N0 tumor with extensive fibrosis and negative margins.   Cycle 1 of adjuvant CAPOX 07/19/2012. Cycle 5 of adjuvant CAPOX 10/11/2012.   CEA 2.5 on 11/14/2012.   CEA 14.6 03/08/2013.   Restaging CT evaluation 03/08/2013 with a 4 mm pulmonary nodule left lung base not identified on comparison exam; new liver lesions including a 29 x 26 mm irregular peripheral enhancing rounded lesion in the dome of the right hepatic lobe, a less well-defined new subcapsular lesion in the lateral right hepatic lobe measuring 12 mm, a new subcapsular lesion in the anterior right hepatic lobe adjacent to the gallbladder fossa measuring 10 mm; a rounded low-density lesion in the inferior right hepatic lobe measuring 10 mm compared to 7 mm on the prior study (radiologist commented this may represent an enlarged cyst).   MRI of the abdomen 04/12/2013 confirmed multiple T2 hyperintense metastatic lesions throughout the right liver   Initiation of FOLFIRI/Avastin with genotype based irinotecan dosing per the [facility] study 04/05/2013.   Restaging CT evaluation on 06/20/2013 (after 2 cycles/4 treatments) showed improvement in the liver metastases and stable size of a left lower lobe pulmonary nodule now with central cavitation.   Continuation of FOLFIRI/Avastin.   Restaging CT evaluation 08/14/2013-decrease in the size of liver metastases, slight decrease in the size  of a cavitary left lower lobe nodule, no evidence of disease progression.   Status post right hepatic lobectomy 09/17/2013. Pathology showed multiple foci of metastatic adenocarcinoma (5 nodules of metastatic  adenocarcinoma 4 of which are subcapsular with the nodules ranging in size from 0.6-1.8 cm in greatest dimension). Margins not involved. Biopsy of a portal lymph node showed benign adipose tissue; no lymph node tissue or malignancy.   Normal CEA 11/06/2013   CT 11/06/2013 with a new pleural-based right lower chest lesion, slight in enlargement of the a left-sided lung nodule  CT 01/29/2014 with a decrease in the right lower chest pleural-based lesion and a slight increase of a left-sided lung lesion, other lung lesions were stable  CT chest 05/02/2014 with a decrease in the size of a right lower lobe pulmonary nodule months similar size of a dominant left lower lobe nodule, minimal enlargement of a smaller left lower lobe nodule, no new site of disease  CT chest 11/07/2014 with a slight increase in several left-sided lung nodules  CT chest 02/10/2015 with new small left hilar lymph nodes, a possible new left lower lobe nodule, and stability of other lung nodules  PET scan 03/12/2015 with hypermetabolic left hilar nodes, hypermetabolic left lower lobe nodules, hyper metabolic retroperitoneal nodes, intense hypermetabolism at the coloanal anastomosis, and hypermetabolic thickening in the presacral space  Cycle 1 FOLFIRI/PANITUMUMAB 05/21/2015  Cycle 2 FOLFIRI/PANITUMUMAB 06/05/2015  Cycle 3 FOLFIRI/PANITUMUMAB 06/19/2015  Cycle 4 FOLFIRI 07/10/2015  Cycle 5 FOLFIRI 07/24/2015  Restaging CTs 08/06/2015-resolution of hilar/retroperitoneal adenopathy, improvement in the hypermetabolic lung nodule, other lung nodules are stable, no new lesions  Cycle 6 FOLFIRI with PANITUMUMAB 08/28/2015 -panitumumab dose reduced  Cycle 7 FOLFIRI 09/11/2015-no panitumumab given  Cycle 8 FOLFIRI with PANITUMUMAB 09/25/2015-PANITUMUMAB dose reduced  Cycle 9 FOLFIRI 10/09/2015-no PANITUMUMAB given  Cycle 10 FOLFIRI with panitumumab 10/23/2015  CTs 11/06/2015-possible slight enlargement of a left lower  lobe nodule, no other evidence of disease progression.  CTs 02/16/2016-enlargement of left-sided pulmonary nodules and mediastinal/left hilar nodes, improved splenomegaly  CTs 06/21/2016-increased size of mediastinal/left hilar nodes, increased left lung nodules, and increased soft tissue at the porta hepatis  CTs 10/28/2016-progressive disease in the chest and abdomen with an enlarging left hilar mass and slight interval enlargement of pulmonary lesions. Right upper quadrant necrotic nodal mass significantly increased in size with mass effect on the liver and possible invasion. Significant compression of the intrahepatic IVC. New right hepatic lobe lesion. Moderate pelvic ascites.  Cycle 1 FOLFIRI/PANITUMUMAB 11/04/2016  Cycle 2 FOLFIRI/PANITUMUMAB 11/18/2016  Cycle 3 FOLFIRI/panitumumab 12/02/2016  Cycle4 FOLFIRI/PANITUMUMAB 12/15/2016  Cycle 5 FOLFIRI/PANITUMUMAB 12/30/2016  CTs 01/06/2017-no evidence of progressive disease, decreased chest adenopathy, stable left lower lobe nodules, decreased liver metastasis, decreased right retroperitoneal mass  Cycle 6 FOLFIRI/panitumumab 01/13/2017  Cycle 7 FOLFIRI/Panitumumab 01/27/2017  Cycle 8 FOLFIRI/panitumumab 02/17/2017  Cycle 9 FOLFIRI/Panitumumab 03/03/2017  Cycle 10 FOLFIRI/Panitumumab 03/24/2017  CTs 04/06/2017-enlargement of right adrenal mass (smaller than October 2019), stable left hilar fullness and lung nodules  Cycle 11 FOLFIRI/Panitumumab 04/07/2017  Cycle 12 FOLFIRI/panitumumab 04/21/2017  Cycle 13 FOLFIRI/Panitumumab 05/05/2017  Palliative radiation to the right adrenal mass 226 2019-3 02/2018  Brain MRI 07/28/2017-4intracranial metastasis in the right frontal lobe, left parietal lobe and left occipital lobe. Surrounding edema pronounced in the left occipital lobe.  SBRT to for brain lesions on 08/12/2017  CTs 08/10/2017- new left upper lobe airspace process, stable lung nodules and medius and lymphadenopathy,  decreased mass involving the adrenal gland with progression of a necrotic anterior  portion of the mass, no liver lesions  Cycle 14 FOLFIRI/panitumumab 08/19/2017, irinotecan dose reduced secondary to thrombocytopenia, Panitumumab dose escalated  Cycle 15 FOLFIRI/panitumumab 09/01/2017) Panitumumab dose reduced secondary to an early rash following cycle 14  CT chest 10/24/2017- progression of disease in the left lung; mild progression of left hilar nodal disease with new right hilar lymphadenopathy and progressive increase in lymph nodes in the mediastinum; interval progression of caudate lobe metastatic lesion.  Cycle 1 FOLFIRI/Panitumumab 11/03/2017  Cycle 2 FOLFIRI/Panitumumab 11/17/2017  Cycle 3 FOLFIRI/panitumumab 12/08/2017 (Panitumumab dose increased)  Cycle 4 FOLFIRI/Panitumumab 12/22/2017 2. History ofIrregular bowel habits/rectal bleeding secondary to #1. 3. History of Mild elevation of the liver enzymes. Question secondary to hepatic steatosis. 4. Indeterminate 8 mm posterior right liver lesion on the staging CT 03/06/2012. 5. Mildly elevated CEA at 5.7 on 03/06/2012. 6. History of radiation erythema at the groin and perineum 7. 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 No clinical evidence of thrombophlebitis or extravasation. 8. Delayed nausea following chemotherapy-Decadron prophylaxis was added with cycle 3 CAPOX-improved. 9. Ileostomy takedown 12/07/2012. 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 11. Port-A-Cath placement 12/31/204 12. Severe neutropenia secondary to chemotherapy following cycle 1 of FOLFIRIin 2015, chemotherapy was dose reduced and he received Neulasta with day 15 cycle 1  13. Nausea and vomiting following cycle 1 of FOLFIRI 14. Rectal stricture-manual/colonoscopic dilatation by Dr. Ardis Hughs 03/01/2016  APR 08/17/2016-no evidence of malignancy 15. History  ofLeukopenia/Thrombocytopenia-persistent, potentially a sequelae of chemotherapy or hepatic toxicity from chemotherapy/radiation. Bone marrow biopsy 11/20/2014 showed cellular bone marrow with trilineage hematopoiesis. Significant dyspoiesis was not present and there was no evidence of metastatic carcinoma. Cytogenetic analysis showed the presence of normal male chromosomes with no observable clonal chromosomal abnormalities.  probable cirrhosis 16. Genetic testing-negative genetic panel in March 2014 17. 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 Severe over the face-steroid Dosepak prescribed 07/03/2015. Improved 07/10/2015. Further improved 07/24/2015, 08/07/2015, 08/28/2015 18. Diarrhea secondary to chemotherapy-encouraged to use Imodium  19. History of Paronychia secondary to PANITUMUMAB 20. 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 History of neutropenia, thrombocytopenia 06/23/2017. Possibly related to radiation. He also has a history of suspected portal hypertension secondaryto toxicity from chemotherap 22. Seizure in the setting of brain metastases 08/05/2017, now maintained onLamictal 23. Altered mental status evaluated by neuro-oncology, Keppra discontinued, improved   Disposition: [name] appears stable.  He has completed 3 cycles of FOLFIRI/Panitumumab.  Plan to proceed with cycle 4 today as scheduled.  The skin rash is mildly increased.  The Panitumumab dose will not be increased with this cycle.  We reviewed the CBC from today.  Counts are adequate for treatment.  He will return for lab, follow-up and cycle 5 FOLFIRI/Panitumumab in 2 weeks.  He will contact the office in the interim with any problems.    Ned Card ANP/GNP-BC   12/22/2017  10:14 AM

## 2017-12-24 ENCOUNTER — Inpatient Hospital Stay: Payer: BC Managed Care – PPO

## 2017-12-24 VITALS — BP 117/78 | HR 78 | Temp 97.7°F | Resp 17

## 2017-12-24 DIAGNOSIS — Z5112 Encounter for antineoplastic immunotherapy: Secondary | ICD-10-CM | POA: Diagnosis not present

## 2017-12-24 DIAGNOSIS — C20 Malignant neoplasm of rectum: Secondary | ICD-10-CM

## 2017-12-24 DIAGNOSIS — C787 Secondary malignant neoplasm of liver and intrahepatic bile duct: Principal | ICD-10-CM

## 2017-12-24 MED ORDER — PEGFILGRASTIM-CBQV 6 MG/0.6ML ~~LOC~~ SOSY
PREFILLED_SYRINGE | SUBCUTANEOUS | Status: AC
  Filled 2017-12-24: qty 0.6

## 2017-12-24 MED ORDER — PEGFILGRASTIM-CBQV 6 MG/0.6ML ~~LOC~~ SOSY
6.0000 mg | PREFILLED_SYRINGE | Freq: Once | SUBCUTANEOUS | Status: AC
  Administered 2017-12-24: 6 mg via SUBCUTANEOUS

## 2017-12-24 MED ORDER — HEPARIN SOD (PORK) LOCK FLUSH 100 UNIT/ML IV SOLN
500.0000 [IU] | Freq: Once | INTRAVENOUS | Status: AC | PRN
  Administered 2017-12-24: 500 [IU]
  Filled 2017-12-24: qty 5

## 2017-12-24 MED ORDER — SODIUM CHLORIDE 0.9 % IJ SOLN
10.0000 mL | INTRAMUSCULAR | Status: DC | PRN
  Administered 2017-12-24: 10 mL
  Filled 2017-12-24: qty 10

## 2017-12-24 NOTE — Patient Instructions (Signed)
Pegfilgrastim injection Jacob [name]) What is this medicine? PEGFILGRASTIM (PEG fil gra stim) is a long-acting granulocyte colony-stimulating factor that stimulates the growth of neutrophils, a type of white blood cell important in the body's fight against infection. It is used to reduce the incidence of fever and infection in patients with certain types of cancer who are receiving chemotherapy that affects the bone marrow, and to increase survival after being exposed to high doses of radiation. This medicine may be used for other purposes; ask your health care provider or pharmacist if you have questions. COMMON BRAND NAME(S): Neulasta What should I tell my health care provider before I take this medicine? They need to know if you have any of these conditions: -kidney disease -latex allergy -ongoing radiation therapy -sickle cell disease -skin reactions to acrylic adhesives (On-Body Injector only) -an unusual or allergic reaction to pegfilgrastim, filgrastim, other medicines, foods, dyes, or preservatives -pregnant or trying to get pregnant -breast-feeding How should I use this medicine? This medicine is for injection under the skin. If you get this medicine at home, you will be taught how to prepare and give the pre-filled syringe or how to use the On-body Injector. Refer to the patient Instructions for Use for detailed instructions. Use exactly as directed. Tell your healthcare provider immediately if you suspect that the On-body Injector may not have performed as intended or if you suspect the use of the On-body Injector resulted in a missed or partial dose. It is important that you put your used needles and syringes in a special sharps container. Do not put them in a trash can. If you do not have a sharps container, call your pharmacist or healthcare provider to get one. Talk to your pediatrician regarding the use of this medicine in children. While this drug may be prescribed for selected  conditions, precautions do apply. Overdosage: If you think you have taken too much of this medicine contact a poison control center or emergency room at once. NOTE: This medicine is only for you. Do not share this medicine with others. What if I miss a dose? It is important not to miss your dose. Call your doctor or health care professional if you miss your dose. If you miss a dose due to an On-body Injector failure or leakage, a new dose should be administered as soon as possible using a single prefilled syringe for manual use. What may interact with this medicine? Interactions have not been studied. Give your health care provider a list of all the medicines, herbs, non-prescription drugs, or dietary supplements you use. Also tell them if you smoke, drink alcohol, or use illegal drugs. Some items may interact with your medicine. This list may not describe all possible interactions. Give your health care provider a list of all the medicines, herbs, non-prescription drugs, or dietary supplements you use. Also tell them if you smoke, drink alcohol, or use illegal drugs. Some items may interact with your medicine. What should I watch for while using this medicine? You may need blood work done while you are taking this medicine. If you are going to need a MRI, CT scan, or other procedure, tell your doctor that you are using this medicine (On-Body Injector only). What side effects may I notice from receiving this medicine? Side effects that you should report to your doctor or health care professional as soon as possible: -allergic reactions like skin rash, itching or hives, swelling of the face, lips, or tongue -dizziness -fever -pain, redness, or irritation at  site where injected -pinpoint red spots on the skin -red or dark-brown urine -shortness of breath or breathing problems -stomach or side pain, or pain at the shoulder -swelling -tiredness -trouble passing urine or change in the amount of  urine Side effects that usually do not require medical attention (report to your doctor or health care professional if they continue or are bothersome): -bone pain -muscle pain This list may not describe all possible side effects. Call your doctor for medical advice about side effects. You may report side effects to FDA at 1-800-FDA-1088. Where should I keep my medicine? Keep out of the reach of children. Store pre-filled syringes in a refrigerator between 2 and 8 degrees C (36 and 46 degrees F). Do not freeze. Keep in carton to protect from light. Throw away this medicine if it is left out of the refrigerator for more than 48 hours. Throw away any unused medicine after the expiration date. NOTE: This sheet is a summary. It may not cover all possible information. If you have questions about this medicine, talk to your doctor, pharmacist, or health care provider.  2018 Elsevier/Gold Standard (2016-03-04 12:58:03)  

## 2017-12-29 ENCOUNTER — Ambulatory Visit: Payer: BC Managed Care – PPO

## 2017-12-29 ENCOUNTER — Other Ambulatory Visit: Payer: BC Managed Care – PPO

## 2017-12-29 ENCOUNTER — Ambulatory Visit: Payer: BC Managed Care – PPO | Admitting: Oncology

## 2017-12-30 ENCOUNTER — Ambulatory Visit (HOSPITAL_BASED_OUTPATIENT_CLINIC_OR_DEPARTMENT_OTHER)
Admission: RE | Admit: 2017-12-30 | Discharge: 2017-12-30 | Disposition: A | Discharge status: Home / Self Care | Payer: BC Managed Care – PPO | Source: Ambulatory Visit | Attending: Family Medicine | Admitting: Family Medicine

## 2017-12-30 DIAGNOSIS — R0600 Dyspnea, unspecified: Secondary | ICD-10-CM | POA: Diagnosis not present

## 2017-12-30 DIAGNOSIS — I059 Rheumatic mitral valve disease, unspecified: Secondary | ICD-10-CM | POA: Insufficient documentation

## 2017-12-30 NOTE — Progress Notes (Signed)
  Echocardiogram 2D Echocardiogram has been performed.  Ameli Sangiovanni T Mancil Pfenning 12/30/2017, 1:42 PM

## 2018-01-01 ENCOUNTER — Other Ambulatory Visit: Payer: Self-pay | Admitting: Oncology

## 2018-01-05 ENCOUNTER — Other Ambulatory Visit: Payer: Self-pay | Admitting: Nurse Practitioner

## 2018-01-05 ENCOUNTER — Encounter: Payer: Self-pay | Admitting: Nurse Practitioner

## 2018-01-05 ENCOUNTER — Inpatient Hospital Stay: Payer: BC Managed Care – PPO

## 2018-01-05 ENCOUNTER — Telehealth: Payer: Self-pay | Admitting: Nurse Practitioner

## 2018-01-05 ENCOUNTER — Inpatient Hospital Stay (HOSPITAL_BASED_OUTPATIENT_CLINIC_OR_DEPARTMENT_OTHER): Payer: BC Managed Care – PPO | Admitting: Nurse Practitioner

## 2018-01-05 VITALS — BP 115/77 | HR 94 | Temp 97.8°F | Resp 18 | Ht 70.0 in | Wt 145.4 lb

## 2018-01-05 DIAGNOSIS — L03031 Cellulitis of right toe: Secondary | ICD-10-CM

## 2018-01-05 DIAGNOSIS — C787 Secondary malignant neoplasm of liver and intrahepatic bile duct: Secondary | ICD-10-CM

## 2018-01-05 DIAGNOSIS — Z95828 Presence of other vascular implants and grafts: Secondary | ICD-10-CM

## 2018-01-05 DIAGNOSIS — R21 Rash and other nonspecific skin eruption: Secondary | ICD-10-CM

## 2018-01-05 DIAGNOSIS — C20 Malignant neoplasm of rectum: Secondary | ICD-10-CM | POA: Diagnosis not present

## 2018-01-05 DIAGNOSIS — Z5112 Encounter for antineoplastic immunotherapy: Secondary | ICD-10-CM | POA: Diagnosis not present

## 2018-01-05 DIAGNOSIS — C7931 Secondary malignant neoplasm of brain: Secondary | ICD-10-CM | POA: Diagnosis not present

## 2018-01-05 LAB — CBC WITH DIFFERENTIAL (CANCER CENTER ONLY)
Abs Immature Granulocytes: 0.06 10*3/uL (ref 0.00–0.07)
Basophils Absolute: 0 10*3/uL (ref 0.0–0.1)
Basophils Relative: 1 %
EOS PCT: 2 %
Eosinophils Absolute: 0.1 10*3/uL (ref 0.0–0.5)
HEMATOCRIT: 32.2 % — AB (ref 39.0–52.0)
HEMOGLOBIN: 10.1 g/dL — AB (ref 13.0–17.0)
Immature Granulocytes: 1 %
LYMPHS ABS: 0.5 10*3/uL — AB (ref 0.7–4.0)
LYMPHS PCT: 9 %
MCH: 26.9 pg (ref 26.0–34.0)
MCHC: 31.4 g/dL (ref 30.0–36.0)
MCV: 85.9 fL (ref 80.0–100.0)
MONO ABS: 0.6 10*3/uL (ref 0.1–1.0)
Monocytes Relative: 11 %
Neutro Abs: 4.2 10*3/uL (ref 1.7–7.7)
Neutrophils Relative %: 76 %
Platelet Count: 134 10*3/uL — ABNORMAL LOW (ref 150–400)
RBC: 3.75 MIL/uL — ABNORMAL LOW (ref 4.22–5.81)
RDW: 21.2 % — ABNORMAL HIGH (ref 11.5–15.5)
WBC Count: 5.5 10*3/uL (ref 4.0–10.5)
nRBC: 0 % (ref 0.0–0.2)

## 2018-01-05 LAB — CMP (CANCER CENTER ONLY)
ALT: 18 U/L (ref 0–44)
AST: 18 U/L (ref 15–41)
Albumin: 3.5 g/dL (ref 3.5–5.0)
Alkaline Phosphatase: 132 U/L — ABNORMAL HIGH (ref 38–126)
Anion gap: 8 (ref 5–15)
BUN: 10 mg/dL (ref 6–20)
CHLORIDE: 107 mmol/L (ref 98–111)
CO2: 26 mmol/L (ref 22–32)
CREATININE: 0.79 mg/dL (ref 0.61–1.24)
Calcium: 9.1 mg/dL (ref 8.9–10.3)
GFR, Est AFR Am: >60 mL/min (ref >60)
GFR, Estimated: >60 mL/min (ref >60)
Glucose, Bld: 106 mg/dL — ABNORMAL HIGH (ref 70–99)
Potassium: 3.6 mmol/L (ref 3.5–5.1)
SODIUM: 141 mmol/L (ref 135–145)
Total Bilirubin: 0.4 mg/dL (ref 0.3–1.2)
Total Protein: 6.3 g/dL — ABNORMAL LOW (ref 6.5–8.1)

## 2018-01-05 LAB — MAGNESIUM: MAGNESIUM: 2 mg/dL (ref 1.7–2.4)

## 2018-01-05 MED ORDER — SODIUM CHLORIDE 0.9 % IV SOLN
3.0000 mg/kg | Freq: Once | INTRAVENOUS | Status: AC
  Administered 2018-01-05: 200 mg via INTRAVENOUS
  Filled 2018-01-05: qty 10

## 2018-01-05 MED ORDER — PALONOSETRON HCL INJECTION 0.25 MG/5ML
INTRAVENOUS | Status: AC
  Filled 2018-01-05: qty 5

## 2018-01-05 MED ORDER — SODIUM CHLORIDE 0.9 % IV SOLN
Freq: Once | INTRAVENOUS | Status: AC
  Administered 2018-01-05: 13:00:00 via INTRAVENOUS
  Filled 2018-01-05: qty 250

## 2018-01-05 MED ORDER — SODIUM CHLORIDE 0.9 % IV SOLN
Freq: Once | INTRAVENOUS | Status: AC
  Administered 2018-01-05: 14:00:00 via INTRAVENOUS
  Filled 2018-01-05: qty 5

## 2018-01-05 MED ORDER — ATROPINE SULFATE 1 MG/ML IJ SOLN: 0.5000 mg | Freq: Once | INTRAMUSCULAR | Status: DC | PRN

## 2018-01-05 MED ORDER — PALONOSETRON HCL INJECTION 0.25 MG/5ML
0.2500 mg | Freq: Once | INTRAVENOUS | Status: AC
  Administered 2018-01-05: 0.25 mg via INTRAVENOUS

## 2018-01-05 MED ORDER — IRINOTECAN HCL CHEMO INJECTION 100 MG/5ML
100.0000 mg/m2 | Freq: Once | INTRAVENOUS | Status: AC
  Administered 2018-01-05: 180 mg via INTRAVENOUS
  Filled 2018-01-05: qty 9

## 2018-01-05 MED ORDER — ATROPINE SULFATE 1 MG/ML IJ SOLN
INTRAMUSCULAR | Status: AC
  Filled 2018-01-05: qty 1

## 2018-01-05 MED ORDER — SODIUM CHLORIDE 0.9 % IJ SOLN
10.0000 mL | INTRAMUSCULAR | Status: DC | PRN
  Administered 2018-01-05: 10 mL via INTRAVENOUS
  Filled 2018-01-05: qty 10

## 2018-01-05 MED ORDER — SODIUM CHLORIDE 0.9 % IV SOLN
2400.0000 mg/m2 | INTRAVENOUS | Status: DC
  Administered 2018-01-05: 4400 mg via INTRAVENOUS
  Filled 2018-01-05: qty 88

## 2018-01-05 MED ORDER — LEUCOVORIN CALCIUM INJECTION 350 MG
400.0000 mg/m2 | Freq: Once | INTRAVENOUS | Status: AC
  Administered 2018-01-05: 736 mg via INTRAVENOUS
  Filled 2018-01-05: qty 25

## 2018-01-05 NOTE — Telephone Encounter (Signed)
Scheduled appt per 10/17 los - pt to get an updated schedule next visit.

## 2018-01-05 NOTE — Progress Notes (Signed)
[facility] OFFICE PROGRESS NOTE   Diagnosis: Rectal cancer  INTERVAL HISTORY:   Jacob [name] returns as scheduled.  He completed cycle 4 FOLFIRI/Panitumumab 12/22/2017.  He has periodic nausea.  No mouth sores.  He had some constipation which was relieved with MiraLAX.  Skin rash is increased over his chest.  He notes some redness with associated tenderness at the right great toe.  For the past several nights he has had a headache.  Last night he thought the headache was potentially related to dehydration.  He drank some fluids and the headache resolved.  Objective:  Vital signs in last 24 hours:  Blood pressure 115/77, pulse 94, temperature 97.8 F (36.6 C), temperature source Oral, resp. rate 18, height '5\' 10"'  (1.778 m), weight 145 lb 6.4 oz (66 kg), SpO2 100 %.    HEENT: No thrush or ulcers. Resp: Lungs clear bilaterally. Cardio: Regular rate and rhythm. GI: Abdomen soft and nontender.  No hepatomegaly.  Left lower quadrant colostomy. Vascular: No leg edema. Neuro: Alert and oriented. Skin: Acne type rash over the chest and upper abdomen.  Mild erythema involving the right great toe lateral edge of nail bed.   Lab Results:  Lab Results  Component Value Date   WBC 6.6 12/22/2017   HGB 10.0 (L) 12/22/2017   HCT 31.1 (L) 12/22/2017   MCV 83.6 12/22/2017   PLT 153 12/22/2017   NEUTROABS 5.4 12/22/2017    Imaging:  No results found.  Medications: I have reviewed the patient's current medications.  Assessment/Plan: 1. Rectal cancer. Partially obstructing mass noted 1-2 cm from the anal verge on a colonoscopy 03/06/2012. Endoscopic ultrasound 03/14/2012 with a 7.5 mm thick, 3.2 cm wide hypoechoic, irregularly bordered mass that clearly passed into and through the muscularis propria layer of the distal rectal wall (uT3); 3 small (largest 7 mm) perirectal lymph nodes. The lymph nodes were all round, discrete, hypoechoic, homogenous; suspicious for malignant  involvement (uN1).  No RAS mutation identified by [facility] 1 testing on the colon resection specimen 06/29/2012, APC alteration identified, microsatellite stable  He began radiation and concurrent Xeloda chemotherapy on 03/20/2012, completed 04/27/2012.   Low anterior resection/coloanal anastomosis and diverting ileostomy 06/29/2012 with the final pathology revealing a T2N0 tumor with extensive fibrosis and negative margins.   Cycle 1 of adjuvant CAPOX 07/19/2012. Cycle 5 of adjuvant CAPOX 10/11/2012.   CEA 2.5 on 11/14/2012.   CEA 14.6 03/08/2013.   Restaging CT evaluation 03/08/2013 with a 4 mm pulmonary nodule left lung base not identified on comparison exam; new liver lesions including a 29 x 26 mm irregular peripheral enhancing rounded lesion in the dome of the right hepatic lobe, a less well-defined new subcapsular lesion in the lateral right hepatic lobe measuring 12 mm, a new subcapsular lesion in the anterior right hepatic lobe adjacent to the gallbladder fossa measuring 10 mm; a rounded low-density lesion in the inferior right hepatic lobe measuring 10 mm compared to 7 mm on the prior study (radiologist commented this may represent an enlarged cyst).   MRI of the abdomen 04/12/2013 confirmed multiple T2 hyperintense metastatic lesions throughout the right liver   Initiation of FOLFIRI/Avastin with genotype based irinotecan dosing per the [facility] study 04/05/2013.   Restaging CT evaluation on 06/20/2013 (after 2 cycles/4 treatments) showed improvement in the liver metastases and stable size of a left lower lobe pulmonary nodule now with central cavitation.   Continuation of FOLFIRI/Avastin.   Restaging CT evaluation 08/14/2013-decrease in the size of liver  metastases, slight decrease in the size of a cavitary left lower lobe nodule, no evidence of disease progression.   Status post right hepatic lobectomy 09/17/2013. Pathology showed multiple foci of metastatic  adenocarcinoma (5 nodules of metastatic adenocarcinoma 4 of which are subcapsular with the nodules ranging in size from 0.6-1.8 cm in greatest dimension). Margins not involved. Biopsy of a portal lymph node showed benign adipose tissue; no lymph node tissue or malignancy.   Normal CEA 11/06/2013   CT 11/06/2013 with a new pleural-based right lower chest lesion, slight in enlargement of the a left-sided lung nodule  CT 01/29/2014 with a decrease in the right lower chest pleural-based lesion and a slight increase of a left-sided lung lesion, other lung lesions were stable  CT chest 05/02/2014 with a decrease in the size of a right lower lobe pulmonary nodule months similar size of a dominant left lower lobe nodule, minimal enlargement of a smaller left lower lobe nodule, no new site of disease  CT chest 11/07/2014 with a slight increase in several left-sided lung nodules  CT chest 02/10/2015 with new small left hilar lymph nodes, a possible new left lower lobe nodule, and stability of other lung nodules  PET scan 03/12/2015 with hypermetabolic left hilar nodes, hypermetabolic left lower lobe nodules, hyper metabolic retroperitoneal nodes, intense hypermetabolism at the coloanal anastomosis, and hypermetabolic thickening in the presacral space  Cycle 1 FOLFIRI/PANITUMUMAB 05/21/2015  Cycle 2 FOLFIRI/PANITUMUMAB 06/05/2015  Cycle 3 FOLFIRI/PANITUMUMAB 06/19/2015  Cycle 4 FOLFIRI 07/10/2015  Cycle 5 FOLFIRI 07/24/2015  Restaging CTs 08/06/2015-resolution of hilar/retroperitoneal adenopathy, improvement in the hypermetabolic lung nodule, other lung nodules are stable, no new lesions  Cycle 6 FOLFIRI with PANITUMUMAB 08/28/2015 -panitumumab dose reduced  Cycle 7 FOLFIRI 09/11/2015-no panitumumab given  Cycle 8 FOLFIRI with PANITUMUMAB 09/25/2015-PANITUMUMAB dose reduced  Cycle 9 FOLFIRI 10/09/2015-no PANITUMUMAB given  Cycle 10 FOLFIRI with panitumumab 10/23/2015  CTs  11/06/2015-possible slight enlargement of a left lower lobe nodule, no other evidence of disease progression.  CTs 02/16/2016-enlargement of left-sided pulmonary nodules and mediastinal/left hilar nodes, improved splenomegaly  CTs 06/21/2016-increased size of mediastinal/left hilar nodes, increased left lung nodules, and increased soft tissue at the porta hepatis  CTs 10/28/2016-progressive disease in the chest and abdomen with an enlarging left hilar mass and slight interval enlargement of pulmonary lesions. Right upper quadrant necrotic nodal mass significantly increased in size with mass effect on the liver and possible invasion. Significant compression of the intrahepatic IVC. New right hepatic lobe lesion. Moderate pelvic ascites.  Cycle 1 FOLFIRI/PANITUMUMAB 11/04/2016  Cycle 2 FOLFIRI/PANITUMUMAB 11/18/2016  Cycle 3 FOLFIRI/panitumumab 12/02/2016  Cycle4 FOLFIRI/PANITUMUMAB 12/15/2016  Cycle 5 FOLFIRI/PANITUMUMAB 12/30/2016  CTs 01/06/2017-no evidence of progressive disease, decreased chest adenopathy, stable left lower lobe nodules, decreased liver metastasis, decreased right retroperitoneal mass  Cycle 6 FOLFIRI/panitumumab 01/13/2017  Cycle 7 FOLFIRI/Panitumumab 01/27/2017  Cycle 8 FOLFIRI/panitumumab 02/17/2017  Cycle 9 FOLFIRI/Panitumumab 03/03/2017  Cycle 10 FOLFIRI/Panitumumab 03/24/2017  CTs 04/06/2017-enlargement of right adrenal mass (smaller than October 2019), stable left hilar fullness and lung nodules  Cycle 11 FOLFIRI/Panitumumab 04/07/2017  Cycle 12 FOLFIRI/panitumumab 04/21/2017  Cycle 13 FOLFIRI/Panitumumab 05/05/2017  Palliative radiation to the right adrenal mass 226 2019-3 02/2018  Brain MRI 07/28/2017-4intracranial metastasis in the right frontal lobe, left parietal lobe and left occipital lobe. Surrounding edema pronounced in the left occipital lobe.  SBRT to for brain lesions on 08/12/2017  CTs 08/10/2017- new left upper lobe airspace process,  stable lung nodules and medius and lymphadenopathy, decreased mass involving the adrenal gland  with progression of a necrotic anterior portion of the mass, no liver lesions  Cycle 14 FOLFIRI/panitumumab 08/19/2017, irinotecan dose reduced secondary to thrombocytopenia, Panitumumab dose escalated  Cycle 15 FOLFIRI/panitumumab 09/01/2017) Panitumumab dose reduced secondary to an early rash following cycle 14  CT chest 10/24/2017- progression of disease in the left lung; mild progression of left hilar nodal disease with new right hilar lymphadenopathy and progressive increase in lymph nodes in the mediastinum; interval progression of caudate lobe metastatic lesion.  Cycle 1 FOLFIRI/Panitumumab 11/03/2017  Cycle 2 FOLFIRI/Panitumumab 11/17/2017  Cycle 3 FOLFIRI/panitumumab 12/08/2017 (Panitumumab dose increased)  Cycle 4 FOLFIRI/Panitumumab 12/22/2017  Cycle 5 FOLFIRI/Panitumumab 01/05/2018 2. History ofIrregular bowel habits/rectal bleeding secondary to #1. 3. History of Mild elevation of the liver enzymes. Question secondary to hepatic steatosis. 4. Indeterminate 8 mm posterior right liver lesion on the staging CT 03/06/2012. 5. Mildly elevated CEA at 5.7 on 03/06/2012. 6. History of radiation erythema at the groin and perineum 7. 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 No clinical evidence of thrombophlebitis or extravasation. 8. Delayed nausea following chemotherapy-Decadron prophylaxis was added with cycle 3 CAPOX-improved. 9. Ileostomy takedown 12/07/2012. 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 11. Port-A-Cath placement 12/31/204 12. Severe neutropenia secondary to chemotherapy following cycle 1 of FOLFIRIin 2015, chemotherapy was dose reduced and he received Neulasta with day 15 cycle 1  13. Nausea and vomiting following cycle 1 of FOLFIRI 14. Rectal stricture-manual/colonoscopic dilatation by Dr.  Ardis Hughs 03/01/2016  APR 08/17/2016-no evidence of malignancy 15. History ofLeukopenia/Thrombocytopenia-persistent, potentially a sequelae of chemotherapy or hepatic toxicity from chemotherapy/radiation. Bone marrow biopsy 11/20/2014 showed cellular bone marrow with trilineage hematopoiesis. Significant dyspoiesis was not present and there was no evidence of metastatic carcinoma. Cytogenetic analysis showed the presence of normal male chromosomes with no observable clonal chromosomal abnormalities.  probable cirrhosis 16. Genetic testing-negative genetic panel in March 2014 17. 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 Severe over the face-steroid Dosepak prescribed 07/03/2015. Improved 07/10/2015. Further improved 07/24/2015, 08/07/2015, 08/28/2015 18. Diarrhea secondary to chemotherapy-encouraged to use Imodium  19. History of Paronychia secondary to PANITUMUMAB 20. 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 History of neutropenia, thrombocytopenia 06/23/2017. Possibly related to radiation. He also has a history of suspected portal hypertension secondaryto toxicity from chemotherap 22. Seizure in the setting of brain metastases 08/05/2017, now maintained onLamictal 23. Altered mental status evaluated by neuro-oncology, Keppra discontinued, improved   Disposition: Mr. Pontillo appears stable.  He has completed 4 cycles of FOLFIRI/Panitumumab.  Plan to proceed with cycle 5 today as scheduled.  We are referring him for restaging CTs after this cycle.  We reviewed the CBC from today.  Counts are adequate for treatment.  He has a mild paronychia involving the right great toe.  He will contact the office with increased erythema or drainage.  He will return for lab, follow-up and the next cycle of chemotherapy in 2 weeks.  He will contact the office in the interim with any problems.  Plan  reviewed with Dr. Benay Spice.    Ned Card ANP/GNP-BC   01/05/2018  11:34 AM

## 2018-01-05 NOTE — Patient Instructions (Signed)
[facility] Discharge Instructions for Patients Receiving Chemotherapy  Today you received the following chemotherapy agents: Panitumumab (Vectibix), Irinotecan (Camptosar), Leucovorin, and Fluorouracil (Adrucil, 5-FU)  To help prevent nausea and vomiting after your treatment, we encourage you to take your nausea medication as directed.    If you develop nausea and vomiting that is not controlled by your nausea medication, call the clinic.   BELOW ARE SYMPTOMS THAT SHOULD BE REPORTED IMMEDIATELY:  *FEVER GREATER THAN 100.5 F  *CHILLS WITH OR WITHOUT FEVER  NAUSEA AND VOMITING THAT IS NOT CONTROLLED WITH YOUR NAUSEA MEDICATION  *UNUSUAL SHORTNESS OF BREATH  *UNUSUAL BRUISING OR BLEEDING  TENDERNESS IN MOUTH AND THROAT WITH OR WITHOUT PRESENCE OF ULCERS  *URINARY PROBLEMS  *BOWEL PROBLEMS  UNUSUAL RASH Items with * indicate a potential emergency and should be followed up as soon as possible.  Feel free to call the clinic should you have any questions or concerns. The clinic phone number is [phone].  Please show the [place] at check-in to the Emergency Department and triage nurse.

## 2018-01-07 ENCOUNTER — Inpatient Hospital Stay: Payer: BC Managed Care – PPO

## 2018-01-07 VITALS — BP 111/75 | HR 102 | Temp 97.9°F | Resp 18

## 2018-01-07 DIAGNOSIS — Z5112 Encounter for antineoplastic immunotherapy: Secondary | ICD-10-CM | POA: Diagnosis not present

## 2018-01-07 MED ORDER — PEGFILGRASTIM-CBQV 6 MG/0.6ML ~~LOC~~ SOSY
6.0000 mg | PREFILLED_SYRINGE | Freq: Once | SUBCUTANEOUS | Status: AC
  Administered 2018-01-07: 6 mg via SUBCUTANEOUS

## 2018-01-07 MED ORDER — PEGFILGRASTIM-CBQV 6 MG/0.6ML ~~LOC~~ SOSY
PREFILLED_SYRINGE | SUBCUTANEOUS | Status: AC
  Filled 2018-01-07: qty 0.6

## 2018-01-07 MED ORDER — SODIUM CHLORIDE 0.9 % IJ SOLN
10.0000 mL | INTRAMUSCULAR | Status: DC | PRN
  Administered 2018-01-07: 10 mL
  Filled 2018-01-07: qty 10

## 2018-01-07 MED ORDER — HEPARIN SOD (PORK) LOCK FLUSH 100 UNIT/ML IV SOLN
500.0000 [IU] | Freq: Once | INTRAVENOUS | Status: AC | PRN
  Administered 2018-01-07: 500 [IU]
  Filled 2018-01-07: qty 5

## 2018-01-15 ENCOUNTER — Other Ambulatory Visit: Payer: Self-pay | Admitting: Oncology

## 2018-01-17 ENCOUNTER — Ambulatory Visit (HOSPITAL_COMMUNITY)
Admission: RE | Admit: 2018-01-17 | Discharge: 2018-01-17 | Disposition: A | Discharge status: Home / Self Care | Payer: BC Managed Care – PPO | Source: Ambulatory Visit | Attending: Nurse Practitioner | Admitting: Nurse Practitioner

## 2018-01-17 ENCOUNTER — Encounter (HOSPITAL_COMMUNITY): Payer: Self-pay

## 2018-01-17 DIAGNOSIS — C20 Malignant neoplasm of rectum: Secondary | ICD-10-CM | POA: Diagnosis present

## 2018-01-17 DIAGNOSIS — R918 Other nonspecific abnormal finding of lung field: Secondary | ICD-10-CM | POA: Insufficient documentation

## 2018-01-17 DIAGNOSIS — R590 Localized enlarged lymph nodes: Secondary | ICD-10-CM | POA: Insufficient documentation

## 2018-01-17 DIAGNOSIS — C787 Secondary malignant neoplasm of liver and intrahepatic bile duct: Secondary | ICD-10-CM | POA: Insufficient documentation

## 2018-01-17 DIAGNOSIS — C7971 Secondary malignant neoplasm of right adrenal gland: Secondary | ICD-10-CM | POA: Insufficient documentation

## 2018-01-17 DIAGNOSIS — K769 Liver disease, unspecified: Secondary | ICD-10-CM | POA: Diagnosis not present

## 2018-01-17 DIAGNOSIS — R161 Splenomegaly, not elsewhere classified: Secondary | ICD-10-CM | POA: Insufficient documentation

## 2018-01-17 MED ORDER — IOHEXOL 300 MG/ML  SOLN
100.0000 mL | Freq: Once | INTRAMUSCULAR | Status: AC | PRN
  Administered 2018-01-17: 100 mL via INTRAVENOUS

## 2018-01-17 MED ORDER — SODIUM CHLORIDE 0.9 % IJ SOLN
INTRAMUSCULAR | Status: AC
  Filled 2018-01-17: qty 50

## 2018-01-19 ENCOUNTER — Inpatient Hospital Stay: Payer: BC Managed Care – PPO

## 2018-01-19 ENCOUNTER — Encounter: Payer: Self-pay | Admitting: Nurse Practitioner

## 2018-01-19 ENCOUNTER — Inpatient Hospital Stay (HOSPITAL_BASED_OUTPATIENT_CLINIC_OR_DEPARTMENT_OTHER): Payer: BC Managed Care – PPO | Admitting: Nurse Practitioner

## 2018-01-19 VITALS — BP 120/84 | HR 93 | Temp 98.0°F | Resp 18 | Ht 70.0 in | Wt 147.0 lb

## 2018-01-19 DIAGNOSIS — R05 Cough: Secondary | ICD-10-CM

## 2018-01-19 DIAGNOSIS — C20 Malignant neoplasm of rectum: Secondary | ICD-10-CM

## 2018-01-19 DIAGNOSIS — R0609 Other forms of dyspnea: Secondary | ICD-10-CM

## 2018-01-19 DIAGNOSIS — Z95828 Presence of other vascular implants and grafts: Secondary | ICD-10-CM

## 2018-01-19 DIAGNOSIS — C7931 Secondary malignant neoplasm of brain: Secondary | ICD-10-CM

## 2018-01-19 DIAGNOSIS — Z5112 Encounter for antineoplastic immunotherapy: Secondary | ICD-10-CM | POA: Diagnosis not present

## 2018-01-19 DIAGNOSIS — C787 Secondary malignant neoplasm of liver and intrahepatic bile duct: Secondary | ICD-10-CM | POA: Diagnosis not present

## 2018-01-19 LAB — CBC WITH DIFFERENTIAL (CANCER CENTER ONLY)
Abs Immature Granulocytes: 0.04 10*3/uL (ref 0.00–0.07)
Basophils Absolute: 0 10*3/uL (ref 0.0–0.1)
Basophils Relative: 0 %
EOS PCT: 3 %
Eosinophils Absolute: 0.1 10*3/uL (ref 0.0–0.5)
HCT: 31.5 % — ABNORMAL LOW (ref 39.0–52.0)
Hemoglobin: 9.8 g/dL — ABNORMAL LOW (ref 13.0–17.0)
Immature Granulocytes: 1 %
Lymphocytes Relative: 7 %
Lymphs Abs: 0.3 10*3/uL — ABNORMAL LOW (ref 0.7–4.0)
MCH: 26.7 pg (ref 26.0–34.0)
MCHC: 31.1 g/dL (ref 30.0–36.0)
MCV: 85.8 fL (ref 80.0–100.0)
MONO ABS: 0.5 10*3/uL (ref 0.1–1.0)
Monocytes Relative: 10 %
NEUTROS ABS: 3.8 10*3/uL (ref 1.7–7.7)
NRBC: 0 % (ref 0.0–0.2)
Neutrophils Relative %: 79 %
PLATELETS: 134 10*3/uL — AB (ref 150–400)
RBC: 3.67 MIL/uL — AB (ref 4.22–5.81)
RDW: 20.7 % — ABNORMAL HIGH (ref 11.5–15.5)
WBC: 4.8 10*3/uL (ref 4.0–10.5)

## 2018-01-19 LAB — CMP (CANCER CENTER ONLY)
ALT: 12 U/L (ref 0–44)
ANION GAP: 10 (ref 5–15)
AST: 16 U/L (ref 15–41)
Albumin: 3.4 g/dL — ABNORMAL LOW (ref 3.5–5.0)
Alkaline Phosphatase: 132 U/L — ABNORMAL HIGH (ref 38–126)
BUN: 11 mg/dL (ref 6–20)
CHLORIDE: 107 mmol/L (ref 98–111)
CO2: 26 mmol/L (ref 22–32)
Calcium: 8.7 mg/dL — ABNORMAL LOW (ref 8.9–10.3)
Creatinine: 0.83 mg/dL (ref 0.61–1.24)
GFR, Estimated: >60 mL/min (ref >60)
Glucose, Bld: 120 mg/dL — ABNORMAL HIGH (ref 70–99)
Potassium: 3.7 mmol/L (ref 3.5–5.1)
SODIUM: 143 mmol/L (ref 135–145)
Total Bilirubin: 0.4 mg/dL (ref 0.3–1.2)
Total Protein: 6.1 g/dL — ABNORMAL LOW (ref 6.5–8.1)

## 2018-01-19 LAB — CEA (IN HOUSE-CHCC): CEA (CHCC-In House): 42.7 ng/mL — ABNORMAL HIGH (ref 0.00–5.00)

## 2018-01-19 LAB — MAGNESIUM: Magnesium: 2.1 mg/dL (ref 1.7–2.4)

## 2018-01-19 MED ORDER — SODIUM CHLORIDE 0.9 % IJ SOLN
10.0000 mL | INTRAMUSCULAR | Status: DC | PRN
  Administered 2018-01-19: 10 mL via INTRAVENOUS
  Filled 2018-01-19: qty 10

## 2018-01-19 MED ORDER — HEPARIN SOD (PORK) LOCK FLUSH 100 UNIT/ML IV SOLN
500.0000 [IU] | Freq: Once | INTRAVENOUS | Status: AC | PRN
  Administered 2018-01-19: 500 [IU] via INTRAVENOUS
  Filled 2018-01-19: qty 5

## 2018-01-19 NOTE — Progress Notes (Signed)
[place] OFFICE PROGRESS NOTE   Diagnosis: Rectal cancer  INTERVAL HISTORY:   Mr. Flax returns as scheduled.  He completed cycle 5 FOLFIRI/Panitumumab 01/05/2018.  He denies nausea/vomiting.  No mouth sores.  No diarrhea.  He again had constipation but was able to manage this with a laxative regimen.  Rash over the chest increased following treatment, better now.  He has a persistent cough.  Intermittent dyspnea.  Over the past weekend he noted coughing, sneezing and body aches.  No fever.  Objective:  Vital signs in last 24 hours:  Blood pressure 120/84, pulse 93, temperature 98 F (36.7 C), temperature source Oral, resp. rate 18, height _0  (1.778 m), weight 147 lb (66.7 kg), SpO2 100 %.    HEENT: No thrush or ulcers. Resp: Lungs clear bilaterally. Cardio: Regular rate and rhythm. GI: Abdomen soft and nontender.  No hepatomegaly.  Left lower quadrant colostomy. Vascular: No leg edema.  Skin: Acne type rash over the upper chest. Port-A-Cath without erythema.   Lab Results:  Lab Results  Component Value Date   WBC 4.8 01/19/2018   HGB 9.8 (L) 01/19/2018   HCT 31.5 (L) 01/19/2018   MCV 85.8 01/19/2018   PLT 134 (L) 01/19/2018   NEUTROABS 3.8 01/19/2018    Imaging:  No results found.  Medications: I have reviewed the patient's current medications.  Assessment/Plan: 1. Rectal cancer. Partially obstructing mass noted 1-2 cm from the anal verge on a colonoscopy 03/06/2012. Endoscopic ultrasound 03/14/2012 with a 7.5 mm thick, 3.2 cm wide hypoechoic, irregularly bordered mass that clearly passed into and through the muscularis propria layer of the distal rectal wall (uT3); 3 small (largest 7 mm) perirectal lymph nodes. The lymph nodes were all round, discrete, hypoechoic, homogenous; suspicious for malignant involvement (uN1).  No RAS mutation identified by [facility] 1 testing on the colon resection specimen 06/29/2012, APC alteration identified,  microsatellite stable  He began radiation and concurrent Xeloda chemotherapy on 03/20/2012, completed 04/27/2012.   Low anterior resection/coloanal anastomosis and diverting ileostomy 06/29/2012 with the final pathology revealing a T2N0 tumor with extensive fibrosis and negative margins.   Cycle 1 of adjuvant CAPOX 07/19/2012. Cycle 5 of adjuvant CAPOX 10/11/2012.   CEA 2.5 on 11/14/2012.   CEA 14.6 03/08/2013.   Restaging CT evaluation 03/08/2013 with a 4 mm pulmonary nodule left lung base not identified on comparison exam; new liver lesions including a 29 x 26 mm irregular peripheral enhancing rounded lesion in the dome of the right hepatic lobe, a less well-defined new subcapsular lesion in the lateral right hepatic lobe measuring 12 mm, a new subcapsular lesion in the anterior right hepatic lobe adjacent to the gallbladder fossa measuring 10 mm; a rounded low-density lesion in the inferior right hepatic lobe measuring 10 mm compared to 7 mm on the prior study (radiologist commented this may represent an enlarged cyst).   MRI of the abdomen 04/12/2013 confirmed multiple T2 hyperintense metastatic lesions throughout the right liver   Initiation of FOLFIRI/Avastin with genotype based irinotecan dosing per the [facility] study 04/05/2013.   Restaging CT evaluation on 06/20/2013 (after 2 cycles/4 treatments) showed improvement in the liver metastases and stable size of a left lower lobe pulmonary nodule now with central cavitation.   Continuation of FOLFIRI/Avastin.   Restaging CT evaluation 08/14/2013-decrease in the size of liver metastases, slight decrease in the size of a cavitary left lower lobe nodule, no evidence of disease progression.   Status post right hepatic lobectomy 09/17/2013. Pathology showed  multiple foci of metastatic adenocarcinoma (5 nodules of metastatic adenocarcinoma 4 of which are subcapsular with the nodules ranging in size from 0.6-1.8 cm in greatest dimension).  Margins not involved. Biopsy of a portal lymph node showed benign adipose tissue; no lymph node tissue or malignancy.   Normal CEA 11/06/2013   CT 11/06/2013 with a new pleural-based right lower chest lesion, slight in enlargement of the a left-sided lung nodule  CT 01/29/2014 with a decrease in the right lower chest pleural-based lesion and a slight increase of a left-sided lung lesion, other lung lesions were stable  CT chest 05/02/2014 with a decrease in the size of a right lower lobe pulmonary nodule months similar size of a dominant left lower lobe nodule, minimal enlargement of a smaller left lower lobe nodule, no new site of disease  CT chest 11/07/2014 with a slight increase in several left-sided lung nodules  CT chest 02/10/2015 with new small left hilar lymph nodes, a possible new left lower lobe nodule, and stability of other lung nodules  PET scan 03/12/2015 with hypermetabolic left hilar nodes, hypermetabolic left lower lobe nodules, hyper metabolic retroperitoneal nodes, intense hypermetabolism at the coloanal anastomosis, and hypermetabolic thickening in the presacral space  Cycle 1 FOLFIRI/PANITUMUMAB 05/21/2015  Cycle 2 FOLFIRI/PANITUMUMAB 06/05/2015  Cycle 3 FOLFIRI/PANITUMUMAB 06/19/2015  Cycle 4 FOLFIRI 07/10/2015  Cycle 5 FOLFIRI 07/24/2015  Restaging CTs 08/06/2015-resolution of hilar/retroperitoneal adenopathy, improvement in the hypermetabolic lung nodule, other lung nodules are stable, no new lesions  Cycle 6 FOLFIRI with PANITUMUMAB 08/28/2015 -panitumumab dose reduced  Cycle 7 FOLFIRI 09/11/2015-no panitumumab given  Cycle 8 FOLFIRI with PANITUMUMAB 09/25/2015-PANITUMUMAB dose reduced  Cycle 9 FOLFIRI 10/09/2015-no PANITUMUMAB given  Cycle 10 FOLFIRI with panitumumab 10/23/2015  CTs 11/06/2015-possible slight enlargement of a left lower lobe nodule, no other evidence of disease progression.  CTs 02/16/2016-enlargement of left-sided pulmonary  nodules and mediastinal/left hilar nodes, improved splenomegaly  CTs 06/21/2016-increased size of mediastinal/left hilar nodes, increased left lung nodules, and increased soft tissue at the porta hepatis  CTs 10/28/2016-progressive disease in the chest and abdomen with an enlarging left hilar mass and slight interval enlargement of pulmonary lesions. Right upper quadrant necrotic nodal mass significantly increased in size with mass effect on the liver and possible invasion. Significant compression of the intrahepatic IVC. New right hepatic lobe lesion. Moderate pelvic ascites.  Cycle 1 FOLFIRI/PANITUMUMAB 11/04/2016  Cycle 2 FOLFIRI/PANITUMUMAB 11/18/2016  Cycle 3 FOLFIRI/panitumumab 12/02/2016  Cycle4 FOLFIRI/PANITUMUMAB 12/15/2016  Cycle 5 FOLFIRI/PANITUMUMAB 12/30/2016  CTs 01/06/2017-no evidence of progressive disease, decreased chest adenopathy, stable left lower lobe nodules, decreased liver metastasis, decreased right retroperitoneal mass  Cycle 6 FOLFIRI/panitumumab 01/13/2017  Cycle 7 FOLFIRI/Panitumumab 01/27/2017  Cycle 8 FOLFIRI/panitumumab 02/17/2017  Cycle 9 FOLFIRI/Panitumumab 03/03/2017  Cycle 10 FOLFIRI/Panitumumab 03/24/2017  CTs 04/06/2017-enlargement of right adrenal mass (smaller than October 2019), stable left hilar fullness and lung nodules  Cycle 11 FOLFIRI/Panitumumab 04/07/2017  Cycle 12 FOLFIRI/panitumumab 04/21/2017  Cycle 13 FOLFIRI/Panitumumab 05/05/2017  Palliative radiation to the right adrenal mass 226 2019-3 02/2018  Brain MRI 07/28/2017-4intracranial metastasis in the right frontal lobe, left parietal lobe and left occipital lobe. Surrounding edema pronounced in the left occipital lobe.  SBRT to for brain lesions on 08/12/2017  CTs 08/10/2017- new left upper lobe airspace process, stable lung nodules and medius and lymphadenopathy, decreased mass involving the adrenal gland with progression of a necrotic anterior portion of the mass, no liver  lesions  Cycle 14 FOLFIRI/panitumumab 08/19/2017, irinotecan dose reduced secondary to thrombocytopenia, Panitumumab dose escalated  Cycle 15 FOLFIRI/panitumumab 09/01/2017) Panitumumab dose reduced secondary to an early rash following cycle 14  CT chest 10/24/2017- progression of disease in the left lung; mild progression of left hilar nodal disease with new right hilar lymphadenopathy and progressive increase in lymph nodes in the mediastinum; interval progression of caudate lobe metastatic lesion.  Cycle 1 FOLFIRI/Panitumumab 11/03/2017  Cycle 2 FOLFIRI/Panitumumab 11/17/2017  Cycle 3 FOLFIRI/panitumumab 12/08/2017 (Panitumumab dose increased)  Cycle 4 FOLFIRI/Panitumumab 12/22/2017  Cycle 5 FOLFIRI/Panitumumab 01/05/2018  CTs 01/17/2018- new 4 cm posterior liver mass.  Mild growth of separate heterogeneous 5.9 cm mass posterior margin of the liver near the IVC.  New bandlike low-attenuation focus in the posterior inferior liver, indeterminate, favoring expansile right portal vein thrombus.  Stable right adrenal metastasis.  Mixed response in the chest.  Left hilar/AP window adenopathy mildly increased.  Right paratracheal adenopathy mildly increased.  Right hilar adenopathy decreased.  Left lower lobe pulmonary nodule stable to mildly decreased.  Tiny right upper lobe pulmonary nodule slightly increased.  New 2.7 cm sub-solid pulmonary nodule basilar right lower lobe.  Waxing and waning left upper lobe nodular foci of consolidation.  New mild splenomegaly. 2. History ofIrregular bowel habits/rectal bleeding secondary to #1. 3. History of Mild elevation of the liver enzymes. Question secondary to hepatic steatosis. 4. Indeterminate 8 mm posterior right liver lesion on the staging CT 03/06/2012. 5. Mildly elevated CEA at 5.7 on 03/06/2012. 6. History of radiation erythema at the groin and perineum 7. Right hand/arm tenderness and numbness following cycle 1 oxaliplatin-likely related to a local  toxicity from oxaliplatin/neuropathy. No clinical evidence of thrombophlebitis or extravasation. 8. Delayed nausea following chemotherapy-Decadron prophylaxis was added with cycle 3 CAPOX-improved. 9. Ileostomy takedown 12/07/2012. 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 11. Port-A-Cath placement 12/31/204 12. Severe neutropenia secondary to chemotherapy following cycle 1 of FOLFIRIin 2015, chemotherapy was dose reduced and he received Neulasta with day 15 cycle 1  13. Nausea and vomiting following cycle 1 of FOLFIRI 14. Rectal stricture-manual/colonoscopic dilatation by Dr. Ardis Hughs 03/01/2016  APR 08/17/2016-no evidence of malignancy 15. History ofLeukopenia/Thrombocytopenia-persistent, potentially a sequelae of chemotherapy or hepatic toxicity from chemotherapy/radiation. Bone marrow biopsy 11/20/2014 showed cellular bone marrow with trilineage hematopoiesis. Significant dyspoiesis was not present and there was no evidence of metastatic carcinoma. Cytogenetic analysis showed the presence of normal male chromosomes with no observable clonal chromosomal abnormalities.  probable cirrhosis 16. Genetic testing-negative genetic panel in March 2014 17. 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 Severe over the face-steroid Dosepak prescribed 07/03/2015. Improved 07/10/2015. Further improved 07/24/2015, 08/07/2015, 08/28/2015 18. Diarrhea secondary to chemotherapy-encouraged to use Imodium  19. History of Paronychia secondary to PANITUMUMAB 20. 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 History of neutropenia, thrombocytopenia 06/23/2017. Possibly related to radiation. He also has a history of suspected portal hypertension secondaryto toxicity from chemotherap 22. Seizure in the setting of brain metastases 08/05/2017, now maintained onLamictal 23. Altered mental status evaluated by neuro-oncology,  Keppra discontinued, improved   Disposition: Mr. Commins appears unchanged.  He has completed 5 cycles of FOLFIRI/Panitumumab.  Recent restaging CTs show evidence of progression.  Dr. Benay Spice reviewed the CT results/images with Mr. Wojtkiewicz and his wife at today's visit and recommends discontinuation of FOLFIRI/Panitumumab as he is currently taking.  Dr. Benay Spice discussed other options including continuation of FOLFIRI with escalation of the Panitumumab dose, changing treatment to FOLFOX/Avastin, referral to Dr. Altamease Oiler at [facility] to determine clinical trial availability.  Dr. Benay Spice will  contact Dr. Altamease Oiler to discuss his case.  He will return for a follow-up visit in 2 weeks with the plan to proceed with FOLFOX/Avastin if there is no clinical trial availability.  Patient seen with Dr. Benay Spice.  25 minutes were spent face-to-face at today's visit with the majority of that time involved in counseling/coordination of care.   Ned Card ANP/GNP-BC   01/19/2018  11:36 AM  This was a shared visit with Ned Card.  I reviewed the restaging CT images with Dr. Tamala Julian and his wife.  The overall pattern is suggestive of disease progression.  I recommend discontinuing FOLFIRI/panitumumab.  Discussed standard treatment options.  I recommend FOLFOX/Avastin if he is not eligible for clinical trial.  I will contact Dr. Altamease Oiler to see if there is a trial at [facility].  Mr. Jay will return for an office visit in 2 weeks.  Julieanne Manson, MD

## 2018-01-25 ENCOUNTER — Telehealth: Payer: Self-pay | Admitting: Oncology

## 2018-01-25 ENCOUNTER — Other Ambulatory Visit: Payer: Self-pay | Admitting: Otolaryngology

## 2018-01-25 NOTE — Telephone Encounter (Signed)
FAXED RECORDS TO DR Janace Hoard [phone]

## 2018-01-26 ENCOUNTER — Telehealth: Payer: Self-pay | Admitting: *Deleted

## 2018-01-26 NOTE — Telephone Encounter (Signed)
Patient left VM that Jacob [name] needs to send medical clearance to Dr. Janace Hoard for his surgery on 02/10/18. Called and left VM with Dr. Rosalita Levan assistant, Tiffany requesting the clearance form (our office does not have these). Left VM for [name] that we have requested the form from Dr. Janace Hoard and that we will need to hold his Avastin at next treatment.

## 2018-01-27 ENCOUNTER — Telehealth: Payer: Self-pay

## 2018-01-27 NOTE — Telephone Encounter (Signed)
Patient called stating that he has not heard about the MRI schedule and knows that he needs this done by his 11/29 appointment with Dr. Mickeal Skinner. This RN called back and left VM message with the number for central scheduling and to schedule MRI either 11/25 or 11/26 and to call back if he has any other questions or concerns.

## 2018-01-28 ENCOUNTER — Other Ambulatory Visit: Payer: Self-pay | Admitting: Oncology

## 2018-01-28 NOTE — Progress Notes (Signed)
START OFF PATHWAY REGIMEN - Colorectal   [id number]:FOLFOX + Bevacizumab (q14d):   A cycle is every 14 days:     Oxaliplatin      Leucovorin      5-Fluorouracil      5-Fluorouracil      Bevacizumab-xxxx   **Always confirm dose/schedule in your pharmacy ordering system**  Patient Characteristics: Distant Metastases, Third Line, KRAS/NRAS Wild-Type, BRAF Wild-Type/Unknown, Prior Anti-EGFR Therapy, MSS / pMMR Therapeutic Status: Distant Metastases BRAF Mutation Status: Wild-Type (no mutation) KRAS/NRAS Mutation Status: Wild-Type (no mutation) Line of Therapy: Third Engineer, civil (consulting) Status: MSS/pMMR Intent of Therapy: Non-Curative / Palliative Intent, Discussed with Patient

## 2018-02-02 ENCOUNTER — Encounter: Payer: Self-pay | Admitting: Nurse Practitioner

## 2018-02-02 ENCOUNTER — Inpatient Hospital Stay: Payer: BC Managed Care – PPO | Attending: Oncology

## 2018-02-02 ENCOUNTER — Inpatient Hospital Stay: Payer: BC Managed Care – PPO

## 2018-02-02 ENCOUNTER — Telehealth: Payer: Self-pay | Admitting: Nurse Practitioner

## 2018-02-02 ENCOUNTER — Inpatient Hospital Stay (HOSPITAL_BASED_OUTPATIENT_CLINIC_OR_DEPARTMENT_OTHER): Payer: BC Managed Care – PPO | Admitting: Nurse Practitioner

## 2018-02-02 VITALS — BP 130/85 | HR 85 | Temp 97.8°F | Resp 18 | Ht 70.0 in | Wt 153.7 lb

## 2018-02-02 DIAGNOSIS — C7931 Secondary malignant neoplasm of brain: Secondary | ICD-10-CM | POA: Diagnosis not present

## 2018-02-02 DIAGNOSIS — C787 Secondary malignant neoplasm of liver and intrahepatic bile duct: Secondary | ICD-10-CM | POA: Insufficient documentation

## 2018-02-02 DIAGNOSIS — R042 Hemoptysis: Secondary | ICD-10-CM

## 2018-02-02 DIAGNOSIS — G939 Disorder of brain, unspecified: Secondary | ICD-10-CM | POA: Diagnosis not present

## 2018-02-02 DIAGNOSIS — C20 Malignant neoplasm of rectum: Secondary | ICD-10-CM | POA: Diagnosis not present

## 2018-02-02 DIAGNOSIS — Z95828 Presence of other vascular implants and grafts: Secondary | ICD-10-CM

## 2018-02-02 DIAGNOSIS — Z5111 Encounter for antineoplastic chemotherapy: Secondary | ICD-10-CM | POA: Diagnosis present

## 2018-02-02 DIAGNOSIS — R05 Cough: Secondary | ICD-10-CM

## 2018-02-02 DIAGNOSIS — R0602 Shortness of breath: Secondary | ICD-10-CM

## 2018-02-02 LAB — CMP (CANCER CENTER ONLY)
ALBUMIN: 3.4 g/dL — AB (ref 3.5–5.0)
ALK PHOS: 104 U/L (ref 38–126)
ALT: 18 U/L (ref 0–44)
ANION GAP: 8 (ref 5–15)
AST: 20 U/L (ref 15–41)
BILIRUBIN TOTAL: 0.6 mg/dL (ref 0.3–1.2)
BUN: 16 mg/dL (ref 6–20)
CALCIUM: 9 mg/dL (ref 8.9–10.3)
CO2: 27 mmol/L (ref 22–32)
Chloride: 106 mmol/L (ref 98–111)
Creatinine: 0.84 mg/dL (ref 0.61–1.24)
GFR, Est AFR Am: >60 mL/min (ref >60)
GLUCOSE: 88 mg/dL (ref 70–99)
POTASSIUM: 3.8 mmol/L (ref 3.5–5.1)
Sodium: 141 mmol/L (ref 135–145)
TOTAL PROTEIN: 6.2 g/dL — AB (ref 6.5–8.1)

## 2018-02-02 LAB — CBC WITH DIFFERENTIAL (CANCER CENTER ONLY)
Abs Immature Granulocytes: 0.02 10*3/uL (ref 0.00–0.07)
Basophils Absolute: 0 10*3/uL (ref 0.0–0.1)
Basophils Relative: 1 %
EOS ABS: 0.1 10*3/uL (ref 0.0–0.5)
EOS PCT: 1 %
HEMATOCRIT: 29.8 % — AB (ref 39.0–52.0)
HEMOGLOBIN: 9.2 g/dL — AB (ref 13.0–17.0)
Immature Granulocytes: 0 %
LYMPHS ABS: 0.5 10*3/uL — AB (ref 0.7–4.0)
LYMPHS PCT: 7 %
MCH: 26.5 pg (ref 26.0–34.0)
MCHC: 30.9 g/dL (ref 30.0–36.0)
MCV: 85.9 fL (ref 80.0–100.0)
MONO ABS: 0.7 10*3/uL (ref 0.1–1.0)
MONOS PCT: 11 %
NRBC: 0 % (ref 0.0–0.2)
Neutro Abs: 5.1 10*3/uL (ref 1.7–7.7)
Neutrophils Relative %: 80 %
Platelet Count: 141 10*3/uL — ABNORMAL LOW (ref 150–400)
RBC: 3.47 MIL/uL — ABNORMAL LOW (ref 4.22–5.81)
RDW: 19.9 % — AB (ref 11.5–15.5)
WBC Count: 6.4 10*3/uL (ref 4.0–10.5)

## 2018-02-02 LAB — MAGNESIUM: MAGNESIUM: 1.7 mg/dL (ref 1.7–2.4)

## 2018-02-02 LAB — CEA (IN HOUSE-CHCC): CEA (CHCC-In House): 58.31 ng/mL — ABNORMAL HIGH (ref 0.00–5.00)

## 2018-02-02 MED ORDER — PALONOSETRON HCL INJECTION 0.25 MG/5ML
INTRAVENOUS | Status: AC
  Filled 2018-02-02: qty 5

## 2018-02-02 MED ORDER — DEXAMETHASONE SODIUM PHOSPHATE 10 MG/ML IJ SOLN
10.0000 mg | Freq: Once | INTRAMUSCULAR | Status: AC
  Administered 2018-02-02: 10 mg via INTRAVENOUS

## 2018-02-02 MED ORDER — OXALIPLATIN CHEMO INJECTION 100 MG/20ML
82.0000 mg/m2 | Freq: Once | INTRAVENOUS | Status: AC
  Administered 2018-02-02: 150 mg via INTRAVENOUS
  Filled 2018-02-02: qty 20

## 2018-02-02 MED ORDER — SODIUM CHLORIDE 0.9 % IV SOLN
2400.0000 mg/m2 | INTRAVENOUS | Status: DC
  Administered 2018-02-02: 4350 mg via INTRAVENOUS
  Filled 2018-02-02: qty 87

## 2018-02-02 MED ORDER — DEXAMETHASONE SODIUM PHOSPHATE 10 MG/ML IJ SOLN
INTRAMUSCULAR | Status: AC
  Filled 2018-02-02: qty 1

## 2018-02-02 MED ORDER — LEUCOVORIN CALCIUM INJECTION 350 MG
400.0000 mg/m2 | Freq: Once | INTRAVENOUS | Status: AC
  Administered 2018-02-02: 728 mg via INTRAVENOUS
  Filled 2018-02-02: qty 36.4

## 2018-02-02 MED ORDER — DEXTROSE 5 % IV SOLN
Freq: Once | INTRAVENOUS | Status: AC
  Administered 2018-02-02: 13:00:00 via INTRAVENOUS
  Filled 2018-02-02: qty 250

## 2018-02-02 MED ORDER — SODIUM CHLORIDE 0.9 % IJ SOLN
10.0000 mL | INTRAMUSCULAR | Status: DC | PRN
  Administered 2018-02-02: 10 mL via INTRAVENOUS
  Filled 2018-02-02: qty 10

## 2018-02-02 MED ORDER — PALONOSETRON HCL INJECTION 0.25 MG/5ML
0.2500 mg | Freq: Once | INTRAVENOUS | Status: AC
  Administered 2018-02-02: 0.25 mg via INTRAVENOUS

## 2018-02-02 NOTE — Telephone Encounter (Signed)
Scheduled appt per 11/14 los - gave patient AVS and calender per los.   

## 2018-02-02 NOTE — Progress Notes (Addendum)
[facility] OFFICE PROGRESS NOTE   Diagnosis: Rectal cancer  INTERVAL HISTORY:   Jacob [name] returns as scheduled.  He has had a few episodes of nausea/vomiting.  Colostomy overall functioning normally.  He has very rare episodes of numbness/tingling in the hands and feet.  He has noted increased shortness of breath and cough.  No fever.  He has noted improvement in hemoptysis over the past few days.  He reports he is scheduled for the vocal cord procedure 02/10/2018.  Objective:  Vital signs in last 24 hours:  Blood pressure 130/85, pulse 85, temperature 97.8 F (36.6 C), temperature source Oral, resp. rate 18, height '5\' 10"'  (1.778 m), weight 153 lb 11.2 oz (69.7 kg), SpO2 100 %.    HEENT: No thrush or ulcers. Resp: Lungs clear bilaterally. Cardio: Regular rate and rhythm. GI: Abdomen soft and nontender.  No hepatomegaly.  Left lower quadrant colostomy. Vascular: No leg edema.  Port-A-Cath without erythema.  Lab Results:  Lab Results  Component Value Date   WBC 6.4 02/02/2018   HGB 9.2 (L) 02/02/2018   HCT 29.8 (L) 02/02/2018   MCV 85.9 02/02/2018   PLT 141 (L) 02/02/2018   NEUTROABS 5.1 02/02/2018    Imaging:  No results found.  Medications: I have reviewed the patient's current medications.  Assessment/Plan: 1. Rectal cancer. Partially obstructing mass noted 1-2 cm from the anal verge on a colonoscopy 03/06/2012. Endoscopic ultrasound 03/14/2012 with a 7.5 mm thick, 3.2 cm wide hypoechoic, irregularly bordered mass that clearly passed into and through the muscularis propria layer of the distal rectal wall (uT3); 3 small (largest 7 mm) perirectal lymph nodes. The lymph nodes were all round, discrete, hypoechoic, homogenous; suspicious for malignant involvement (uN1).  No RAS mutation identified by [facility] 1 testing on the colon resection specimen 06/29/2012, APC alteration identified, microsatellite stable  He began radiation and concurrent Xeloda  chemotherapy on 03/20/2012, completed 04/27/2012.   Low anterior resection/coloanal anastomosis and diverting ileostomy 06/29/2012 with the final pathology revealing a T2N0 tumor with extensive fibrosis and negative margins.   Cycle 1 of adjuvant CAPOX 07/19/2012. Cycle 5 of adjuvant CAPOX 10/11/2012.   CEA 2.5 on 11/14/2012.   CEA 14.6 03/08/2013.   Restaging CT evaluation 03/08/2013 with a 4 mm pulmonary nodule left lung base not identified on comparison exam; new liver lesions including a 29 x 26 mm irregular peripheral enhancing rounded lesion in the dome of the right hepatic lobe, a less well-defined new subcapsular lesion in the lateral right hepatic lobe measuring 12 mm, a new subcapsular lesion in the anterior right hepatic lobe adjacent to the gallbladder fossa measuring 10 mm; a rounded low-density lesion in the inferior right hepatic lobe measuring 10 mm compared to 7 mm on the prior study (radiologist commented this may represent an enlarged cyst).   MRI of the abdomen 04/12/2013 confirmed multiple T2 hyperintense metastatic lesions throughout the right liver   Initiation of FOLFIRI/Avastin with genotype based irinotecan dosing per the [facility] study 04/05/2013.   Restaging CT evaluation on 06/20/2013 (after 2 cycles/4 treatments) showed improvement in the liver metastases and stable size of a left lower lobe pulmonary nodule now with central cavitation.   Continuation of FOLFIRI/Avastin.   Restaging CT evaluation 08/14/2013-decrease in the size of liver metastases, slight decrease in the size of a cavitary left lower lobe nodule, no evidence of disease progression.   Status post right hepatic lobectomy 09/17/2013. Pathology showed multiple foci of metastatic adenocarcinoma (5 nodules of metastatic adenocarcinoma  4 of which are subcapsular with the nodules ranging in size from 0.6-1.8 cm in greatest dimension). Margins not involved. Biopsy of a portal lymph node showed benign  adipose tissue; no lymph node tissue or malignancy.   Normal CEA 11/06/2013   CT 11/06/2013 with a new pleural-based right lower chest lesion, slight in enlargement of the a left-sided lung nodule  CT 01/29/2014 with a decrease in the right lower chest pleural-based lesion and a slight increase of a left-sided lung lesion, other lung lesions were stable  CT chest 05/02/2014 with a decrease in the size of a right lower lobe pulmonary nodule months similar size of a dominant left lower lobe nodule, minimal enlargement of a smaller left lower lobe nodule, no new site of disease  CT chest 11/07/2014 with a slight increase in several left-sided lung nodules  CT chest 02/10/2015 with new small left hilar lymph nodes, a possible new left lower lobe nodule, and stability of other lung nodules  PET scan 03/12/2015 with hypermetabolic left hilar nodes, hypermetabolic left lower lobe nodules, hyper metabolic retroperitoneal nodes, intense hypermetabolism at the coloanal anastomosis, and hypermetabolic thickening in the presacral space  Cycle 1 FOLFIRI/PANITUMUMAB 05/21/2015  Cycle 2 FOLFIRI/PANITUMUMAB 06/05/2015  Cycle 3 FOLFIRI/PANITUMUMAB 06/19/2015  Cycle 4 FOLFIRI 07/10/2015  Cycle 5 FOLFIRI 07/24/2015  Restaging CTs 08/06/2015-resolution of hilar/retroperitoneal adenopathy, improvement in the hypermetabolic lung nodule, other lung nodules are stable, no new lesions  Cycle 6 FOLFIRI with PANITUMUMAB 08/28/2015 -panitumumab dose reduced  Cycle 7 FOLFIRI 09/11/2015-no panitumumab given  Cycle 8 FOLFIRI with PANITUMUMAB 09/25/2015-PANITUMUMAB dose reduced  Cycle 9 FOLFIRI 10/09/2015-no PANITUMUMAB given  Cycle 10 FOLFIRI with panitumumab 10/23/2015  CTs 11/06/2015-possible slight enlargement of a left lower lobe nodule, no other evidence of disease progression.  CTs 02/16/2016-enlargement of left-sided pulmonary nodules and mediastinal/left hilar nodes, improved splenomegaly  CTs  06/21/2016-increased size of mediastinal/left hilar nodes, increased left lung nodules, and increased soft tissue at the porta hepatis  CTs 10/28/2016-progressive disease in the chest and abdomen with an enlarging left hilar mass and slight interval enlargement of pulmonary lesions. Right upper quadrant necrotic nodal mass significantly increased in size with mass effect on the liver and possible invasion. Significant compression of the intrahepatic IVC. New right hepatic lobe lesion. Moderate pelvic ascites.  Cycle 1 FOLFIRI/PANITUMUMAB 11/04/2016  Cycle 2 FOLFIRI/PANITUMUMAB 11/18/2016  Cycle 3 FOLFIRI/panitumumab 12/02/2016  Cycle4 FOLFIRI/PANITUMUMAB 12/15/2016  Cycle 5 FOLFIRI/PANITUMUMAB 12/30/2016  CTs 01/06/2017-no evidence of progressive disease, decreased chest adenopathy, stable left lower lobe nodules, decreased liver metastasis, decreased right retroperitoneal mass  Cycle 6 FOLFIRI/panitumumab 01/13/2017  Cycle 7 FOLFIRI/Panitumumab 01/27/2017  Cycle 8 FOLFIRI/panitumumab 02/17/2017  Cycle 9 FOLFIRI/Panitumumab 03/03/2017  Cycle 10 FOLFIRI/Panitumumab 03/24/2017  CTs 04/06/2017-enlargement of right adrenal mass (smaller than October 2019), stable left hilar fullness and lung nodules  Cycle 11 FOLFIRI/Panitumumab 04/07/2017  Cycle 12 FOLFIRI/panitumumab 04/21/2017  Cycle 13 FOLFIRI/Panitumumab 05/05/2017  Palliative radiation to the right adrenal mass 226 2019-3 02/2018  Brain MRI 07/28/2017-4intracranial metastasis in the right frontal lobe, left parietal lobe and left occipital lobe. Surrounding edema pronounced in the left occipital lobe.  SBRT to for brain lesions on 08/12/2017  CTs 08/10/2017- new left upper lobe airspace process, stable lung nodules and medius and lymphadenopathy, decreased mass involving the adrenal gland with progression of a necrotic anterior portion of the mass, no liver lesions  Cycle 14 FOLFIRI/panitumumab 08/19/2017, irinotecan dose reduced  secondary to thrombocytopenia, Panitumumab dose escalated  Cycle 15 FOLFIRI/panitumumab 09/01/2017) Panitumumab dose reduced secondary to an  early rash following cycle 14  CT chest 10/24/2017- progression of disease in the left lung; mild progression of left hilar nodal disease with new right hilar lymphadenopathy and progressive increase in lymph nodes in the mediastinum; interval progression of caudate lobe metastatic lesion.  Cycle 1 FOLFIRI/Panitumumab 11/03/2017  Cycle 2 FOLFIRI/Panitumumab 11/17/2017  Cycle 3 FOLFIRI/panitumumab 12/08/2017 (Panitumumab dose increased)  Cycle 4 FOLFIRI/Panitumumab 12/22/2017  Cycle 5 FOLFIRI/Panitumumab 01/05/2018  CTs 01/17/2018- new 4 cm posterior liver mass.  Mild growth of separate heterogeneous 5.9 cm mass posterior margin of the liver near the IVC.  New bandlike low-attenuation focus in the posterior inferior liver, indeterminate, favoring expansile right portal vein thrombus.  Stable right adrenal metastasis.  Mixed response in the chest.  Left hilar/AP window adenopathy mildly increased.  Right paratracheal adenopathy mildly increased.  Right hilar adenopathy decreased.  Left lower lobe pulmonary nodule stable to mildly decreased.  Tiny right upper lobe pulmonary nodule slightly increased.  New 2.7 cm sub-solid pulmonary nodule basilar right lower lobe.  Waxing and waning left upper lobe nodular foci of consolidation.  New mild splenomegaly.  Cycle 1 FOLFOX 02/02/2018 2. History ofIrregular bowel habits/rectal bleeding secondary to #1. 3. History of Mild elevation of the liver enzymes. Question secondary to hepatic steatosis. 4. Indeterminate 8 mm posterior right liver lesion on the staging CT 03/06/2012. 5. Mildly elevated CEA at 5.7 on 03/06/2012. 6. History of radiation erythema at the groin and perineum 7. 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 No clinical  evidence of thrombophlebitis or extravasation. 8. Delayed nausea following chemotherapy-Decadron prophylaxis was added with cycle 3 CAPOX-improved. 9. Ileostomy takedown 12/07/2012. 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 11. Port-A-Cath placement 12/31/204 12. Severe neutropenia secondary to chemotherapy following cycle 1 of FOLFIRIin 2015, chemotherapy was dose reduced and he received Neulasta with day 15 cycle 1  13. Nausea and vomiting following cycle 1 of FOLFIRI 14. Rectal stricture-manual/colonoscopic dilatation by Dr. Ardis Hughs 03/01/2016  APR 08/17/2016-no evidence of malignancy 15. History ofLeukopenia/Thrombocytopenia-persistent, potentially a sequelae of chemotherapy or hepatic toxicity from chemotherapy/radiation. Bone marrow biopsy 11/20/2014 showed cellular bone marrow with trilineage hematopoiesis. Significant dyspoiesis was not present and there was no evidence of metastatic carcinoma. Cytogenetic analysis showed the presence of normal male chromosomes with no observable clonal chromosomal abnormalities.  probable cirrhosis 16. Genetic testing-negative genetic panel in March 2014 17. 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 Severe over the face-steroid Dosepak prescribed 07/03/2015. Improved 07/10/2015. Further improved 07/24/2015, 08/07/2015, 08/28/2015 18. Diarrhea secondary to chemotherapy-encouraged to use Imodium  19. History of Paronychia secondary to PANITUMUMAB 20. 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 History of neutropenia, thrombocytopenia 06/23/2017. Possibly related to radiation. He also has a history of suspected portal hypertension secondaryto toxicity from chemotherap 22. Seizure in the setting of brain metastases 08/05/2017, now maintained onLamictal 23. Altered mental status evaluated by neuro-oncology, Keppra discontinued, improved   Disposition:  Mr. Pelc appears unchanged.  Plan to proceed with cycle 1 FOLFOX today as scheduled.  We again reviewed potential toxicities and questions were answered.  We had previously discussed treating with Avastin as well.  This has been decided against due to the hemoptysis, upcoming vocal cord procedure and brain metastases.  We reviewed the CBC from today.  Counts are adequate for treatment.  He should have a CBC with differential the day of the vocal cord procedure.  He was given an order  for this to take to the procedure.  He will return for lab, follow-up and cycle 2 FOLFOX in 2 weeks.  He will contact the office in the interim with any problems.  ECOG performance status 1.  Patient seen with Dr. Benay Spice.  Ned Card ANP/GNP-BC   02/02/2018  12:00 PM  This was a shared visit with Ned Card.  Mr. Hehn will begin treatment with salvage FOLFOX chemotherapy today.  Avastin will be placed on hold secondary to hemoptysis, the history of brain metastases, and planned upcoming surgery.  He understands the chance of an allergic reaction with oxaliplatin.  He will contact us for increased hemoptysis.  We will recommend Dr. Janace Hoard obtain a CBC the day of the vocal cord procedure.  Mr. Wilensky will return for an office visit and cycle 2 FOLFOX in 2 weeks.  Julieanne Manson, MD

## 2018-02-02 NOTE — Patient Instructions (Signed)
[facility] Discharge Instructions for Patients Receiving Chemotherapy  Today you received the following chemotherapy agents:  Oxali, Leucovorin, Fluorouracil  To help prevent nausea and vomiting after your treatment, we encourage you to take your nausea medication as prescribed.   If you develop nausea and vomiting that is not controlled by your nausea medication, call the clinic.   BELOW ARE SYMPTOMS THAT SHOULD BE REPORTED IMMEDIATELY:  *FEVER GREATER THAN 100.5 F  *CHILLS WITH OR WITHOUT FEVER  NAUSEA AND VOMITING THAT IS NOT CONTROLLED WITH YOUR NAUSEA MEDICATION  *UNUSUAL SHORTNESS OF BREATH  *UNUSUAL BRUISING OR BLEEDING  TENDERNESS IN MOUTH AND THROAT WITH OR WITHOUT PRESENCE OF ULCERS  *URINARY PROBLEMS  *BOWEL PROBLEMS  UNUSUAL RASH Items with * indicate a potential emergency and should be followed up as soon as possible.  Feel free to call the clinic should you have any questions or concerns. The clinic phone number is [phone].  Please show the [place] at check-in to the Emergency Department and triage nurse.

## 2018-02-03 ENCOUNTER — Telehealth: Payer: Self-pay | Admitting: *Deleted

## 2018-02-03 NOTE — Telephone Encounter (Signed)
Called to request status of clearance note from oncology for his surgery on 02/10/18. Faxed note to office [phone].

## 2018-02-04 ENCOUNTER — Inpatient Hospital Stay: Payer: BC Managed Care – PPO

## 2018-02-04 VITALS — BP 148/96 | HR 95 | Temp 97.7°F | Resp 16

## 2018-02-04 DIAGNOSIS — Z5111 Encounter for antineoplastic chemotherapy: Secondary | ICD-10-CM | POA: Diagnosis not present

## 2018-02-04 DIAGNOSIS — C787 Secondary malignant neoplasm of liver and intrahepatic bile duct: Principal | ICD-10-CM

## 2018-02-04 DIAGNOSIS — C20 Malignant neoplasm of rectum: Secondary | ICD-10-CM

## 2018-02-04 MED ORDER — HEPARIN SOD (PORK) LOCK FLUSH 100 UNIT/ML IV SOLN
500.0000 [IU] | Freq: Once | INTRAVENOUS | Status: AC | PRN
  Administered 2018-02-04: 500 [IU]
  Filled 2018-02-04: qty 5

## 2018-02-04 MED ORDER — SODIUM CHLORIDE 0.9% FLUSH
10.0000 mL | INTRAVENOUS | Status: DC | PRN
  Administered 2018-02-04: 10 mL
  Filled 2018-02-04: qty 10

## 2018-02-04 NOTE — Patient Instructions (Signed)
Implanted Port Home Guide An implanted port is a type of central line that is placed under the skin. Central lines are used to provide IV access when treatment or nutrition needs to be given through a person's veins. Implanted ports are used for long-term IV access. An implanted port may be placed because:  You need IV medicine that would be irritating to the small veins in your hands or arms.  You need long-term IV medicines, such as antibiotics.  You need IV nutrition for a long period.  You need frequent blood draws for lab tests.  You need dialysis.  Implanted ports are usually placed in the chest area, but they can also be placed in the upper arm, the abdomen, or the leg. An implanted port has two main parts:  Reservoir. The reservoir is round and will appear as a small, raised area under your skin. The reservoir is the part where a needle is inserted to give medicines or draw blood.  Catheter. The catheter is a thin, flexible tube that extends from the reservoir. The catheter is placed into a large vein. Medicine that is inserted into the reservoir goes into the catheter and then into the vein.  How will I care for my incision site? Do not get the incision site wet. Bathe or shower as directed by your health care provider. How is my port accessed? Special steps must be taken to access the port:  Before the port is accessed, a numbing cream can be placed on the skin. This helps numb the skin over the port site.  Your health care provider uses a sterile technique to access the port. ? Your health care provider must put on a mask and sterile gloves. ? The skin over your port is cleaned carefully with an antiseptic and allowed to dry. ? The port is gently pinched between sterile gloves, and a needle is inserted into the port.  Only "non-coring" port needles should be used to access the port. Once the port is accessed, a blood return should be checked. This helps ensure that the port  is in the vein and is not clogged.  If your port needs to remain accessed for a constant infusion, a clear (transparent) bandage will be placed over the needle site. The bandage and needle will need to be changed every week, or as directed by your health care provider.  Keep the bandage covering the needle clean and dry. Do not get it wet. Follow your health care provider's instructions on how to take a shower or bath while the port is accessed.  If your port does not need to stay accessed, no bandage is needed over the port.  What is flushing? Flushing helps keep the port from getting clogged. Follow your health care provider's instructions on how and when to flush the port. Ports are usually flushed with saline solution or a medicine called heparin. The need for flushing will depend on how the port is used.  If the port is used for intermittent medicines or blood draws, the port will need to be flushed: ? After medicines have been given. ? After blood has been drawn. ? As part of routine maintenance.  If a constant infusion is running, the port may not need to be flushed.  How long will my port stay implanted? The port can stay in for as long as your health care provider thinks it is needed. When it is time for the port to come out, surgery will be   done to remove it. The procedure is similar to the one performed when the port was put in. When should I seek immediate medical care? When you have an implanted port, you should seek immediate medical care if:  You notice a bad smell coming from the incision site.  You have swelling, redness, or drainage at the incision site.  You have more swelling or pain at the port site or the surrounding area.  You have a fever that is not controlled with medicine.  This information is not intended to replace advice given to you by your health care provider. Make sure you discuss any questions you have with your health care provider. Document  Released: 03/08/2005 Document Revised: 08/14/2015 Document Reviewed: 11/13/2012 Elsevier Interactive Patient Education  2017 Elsevier Inc.  

## 2018-02-06 NOTE — Pre-Procedure Instructions (Signed)
Jacob [name]  02/06/2018      [facility] ([place]), [place] [address] [place] ([place]) [place] [address] Phone: [phone] Fax: [phone]    Your procedure is scheduled on November 22nd.  Report to [facility] Admitting at [place].M.  Call this number if you have problems the morning of surgery:  [phone]   Remember:  Do not eat or drink after midnight.    Take these medicines the morning of surgery with A SIP OF WATER   acetaminophen (TYLENOL) if needed  docusate sodium (STOOL SOFTENER)   lamoTRIgine (LAMICTAL)  prochlorperazine (COMPAZINE) if needed  tiZANidine (ZANAFLEX) if needed  7 days prior to surgery STOP taking any Aspirin(unless otherwise instructed by your surgeon), Aleve, Naproxen, Ibuprofen, Motrin, Advil, Goody's, BC's, all herbal medications, fish oil, and all vitamins     Do not wear jewelry.  Do not wear lotions, powders, or colognes, or deodorant.  Men may shave face and neck.  Do not bring valuables to the hospital.  [facility] is not responsible for any belongings or valuables.  Contacts, dentures or bridgework may not be worn into surgery.  Leave your suitcase in the car.  After surgery it may be brought to your room.  For patients admitted to the hospital, discharge time will be determined by your treatment team.  Patients discharged the day of surgery will not be allowed to drive home.    [facility]- Preparing For Surgery  Before surgery, you can play an important role. Because skin is not sterile, your skin needs to be as free of germs as possible. You can reduce the number of germs on your skin by washing with CHG (chlorahexidine gluconate) Soap before surgery.  CHG is an antiseptic cleaner which kills germs and bonds with the skin to continue killing germs even after washing.    Oral Hygiene is also important to reduce your risk of infection.  Remember - BRUSH YOUR TEETH THE MORNING  OF SURGERY WITH YOUR REGULAR TOOTHPASTE  Please do not use if you have an allergy to CHG or antibacterial soaps. If your skin becomes reddened/irritated stop using the CHG.  Do not shave (including legs and underarms) for at least 48 hours prior to first CHG shower. It is OK to shave your face.  Please follow these instructions carefully.   1. Shower the NIGHT BEFORE SURGERY and the MORNING OF SURGERY with CHG.   2. If you chose to wash your hair, wash your hair first as usual with your normal shampoo.  3. After you shampoo, rinse your hair and body thoroughly to remove the shampoo.  4. Use CHG as you would any other liquid soap. You can apply CHG directly to the skin and wash gently with a scrungie or a clean washcloth.   5. Apply the CHG Soap to your body ONLY FROM THE NECK DOWN.  Do not use on open wounds or open sores. Avoid contact with your eyes, ears, mouth and genitals (private parts). Wash Face and genitals (private parts)  with your normal soap.  6. Wash thoroughly, paying special attention to the area where your surgery will be performed.  7. Thoroughly rinse your body with warm water from the neck down.  8. DO NOT shower/wash with your normal soap after using and rinsing off the CHG Soap.  9. Pat yourself dry with a CLEAN TOWEL.  10. Wear CLEAN PAJAMAS to bed the  night before surgery, wear comfortable clothes the morning of surgery  11. Place CLEAN SHEETS on your bed the night of your first shower and DO NOT SLEEP WITH PETS.    Day of Surgery:  Do not apply any deodorants/lotions.  Please wear clean clothes to the hospital/surgery center.   Remember to brush your teeth WITH YOUR REGULAR TOOTHPASTE.    Please read over the following fact sheets that you were given.

## 2018-02-07 ENCOUNTER — Other Ambulatory Visit: Payer: Self-pay

## 2018-02-07 ENCOUNTER — Encounter (HOSPITAL_COMMUNITY): Payer: Self-pay

## 2018-02-07 ENCOUNTER — Encounter (HOSPITAL_COMMUNITY)
Admission: RE | Admit: 2018-02-07 | Discharge: 2018-02-07 | Disposition: A | Discharge status: Home / Self Care | Payer: BC Managed Care – PPO | Source: Ambulatory Visit | Attending: Otolaryngology | Admitting: Otolaryngology

## 2018-02-07 DIAGNOSIS — Z01818 Encounter for other preprocedural examination: Secondary | ICD-10-CM | POA: Insufficient documentation

## 2018-02-07 NOTE — Progress Notes (Addendum)
PCP - Penni Homans, MD Oncologist-Dr. Betsy Coder Cardiologist - denies  Chest x-ray -01/17/18  EKG - 08/05/17 Stress Test - denies ECHO - 12/30/17 Cardiac Cath - denies Previous labs drawn on 02/02/18 from office visit.  Sleep Study - denies  Aspirin Instructions: N/A  Anesthesia review: Yes. Pt brought in a written order from Oncologist for a CBC with diff to be done on DOS. Order placed in signed and held. Written order in chart.   Patient denies fever and chest pain at PAT appointment. Pt does report SOB and cough over the last few weeks. HR noted to range from 107-115 at PAT appointment. Oncologist aware and has been reported in last office visit note. Jeneen Rinks PA made aware and has reviewed chart.  Patient verbalized understanding of instructions that were given to them at the PAT appointment. Patient was also instructed that they will need to review over the PAT instructions again at home before surgery.

## 2018-02-07 NOTE — Pre-Procedure Instructions (Signed)
AUDIE STAYER  02/07/2018      [facility] (SE), [place] [address] [place] ([place]) [place] [address] Phone: [phone] Fax: [phone]    Your procedure is scheduled on November 22nd.  Report to [facility] Admitting at [place].M.  Call this number if you have problems the morning of surgery:  [phone]   Remember:  Do not eat or drink after midnight.    Take these medicines the morning of surgery with A SIP OF WATER   acetaminophen (TYLENOL) if needed   lamoTRIgine (LAMICTAL)  prochlorperazine (COMPAZINE) if needed  tiZANidine (ZANAFLEX) if needed  7 days prior to surgery STOP taking any Aspirin(unless otherwise instructed by your surgeon), Aleve, Naproxen, Ibuprofen, Motrin, Advil, Goody's, BC's, all herbal medications, fish oil, and all vitamins     Do not wear jewelry.  Do not wear lotions, powders, or colognes, or deodorant.  Men may shave face and neck.  Do not bring valuables to the hospital.  [facility] is not responsible for any belongings or valuables.  Contacts, dentures or bridgework may not be worn into surgery.  Leave your suitcase in the car.  After surgery it may be brought to your room.  For patients admitted to the hospital, discharge time will be determined by your treatment team.  Patients discharged the day of surgery will not be allowed to drive home.    [facility]- Preparing For Surgery  Before surgery, you can play an important role. Because skin is not sterile, your skin needs to be as free of germs as possible. You can reduce the number of germs on your skin by washing with CHG (chlorahexidine gluconate) Soap before surgery.  CHG is an antiseptic cleaner which kills germs and bonds with the skin to continue killing germs even after washing.    Oral Hygiene is also important to reduce your risk of infection.  Remember - BRUSH YOUR TEETH THE MORNING OF SURGERY WITH YOUR REGULAR  TOOTHPASTE  Please do not use if you have an allergy to CHG or antibacterial soaps. If your skin becomes reddened/irritated stop using the CHG.  Do not shave (including legs and underarms) for at least 48 hours prior to first CHG shower. It is OK to shave your face.  Please follow these instructions carefully.   1. Shower the NIGHT BEFORE SURGERY and the MORNING OF SURGERY with CHG.   2. If you chose to wash your hair, wash your hair first as usual with your normal shampoo.  3. After you shampoo, rinse your hair and body thoroughly to remove the shampoo.  4. Use CHG as you would any other liquid soap. You can apply CHG directly to the skin and wash gently with a scrungie or a clean washcloth.   5. Apply the CHG Soap to your body ONLY FROM THE NECK DOWN.  Do not use on open wounds or open sores. Avoid contact with your eyes, ears, mouth and genitals (private parts). Wash Face and genitals (private parts)  with your normal soap.  6. Wash thoroughly, paying special attention to the area where your surgery will be performed.  7. Thoroughly rinse your body with warm water from the neck down.  8. DO NOT shower/wash with your normal soap after using and rinsing off the CHG Soap.  9. Pat yourself dry with a CLEAN TOWEL.  10. Wear CLEAN PAJAMAS to bed the night before surgery, wear comfortable  clothes the morning of surgery  11. Place CLEAN SHEETS on your bed the night of your first shower and DO NOT SLEEP WITH PETS.    Day of Surgery:  Do not apply any deodorants/lotions.  Please wear clean clothes to the hospital/surgery center.   Remember to brush your teeth WITH YOUR REGULAR TOOTHPASTE.    Please read over the following fact sheets that you were given.

## 2018-02-10 ENCOUNTER — Encounter (HOSPITAL_COMMUNITY): Payer: Self-pay | Admitting: *Deleted

## 2018-02-10 ENCOUNTER — Observation Stay (HOSPITAL_COMMUNITY)
Admission: RE | Admit: 2018-02-10 | Discharge: 2018-02-11 | Disposition: A | Discharge status: Home / Self Care | Payer: BC Managed Care – PPO | Source: Ambulatory Visit | Attending: Otolaryngology | Admitting: Otolaryngology

## 2018-02-10 ENCOUNTER — Encounter (HOSPITAL_COMMUNITY)
Admission: RE | Disposition: A | Discharge status: Home / Self Care | Payer: Self-pay | Source: Ambulatory Visit | Attending: Otolaryngology

## 2018-02-10 ENCOUNTER — Other Ambulatory Visit: Payer: Self-pay

## 2018-02-10 ENCOUNTER — Ambulatory Visit (HOSPITAL_COMMUNITY): Payer: BC Managed Care – PPO | Admitting: Anesthesiology

## 2018-02-10 ENCOUNTER — Ambulatory Visit (HOSPITAL_COMMUNITY): Payer: BC Managed Care – PPO | Admitting: Physician Assistant

## 2018-02-10 DIAGNOSIS — Z8249 Family history of ischemic heart disease and other diseases of the circulatory system: Secondary | ICD-10-CM | POA: Diagnosis not present

## 2018-02-10 DIAGNOSIS — Z9049 Acquired absence of other specified parts of digestive tract: Secondary | ICD-10-CM | POA: Diagnosis not present

## 2018-02-10 DIAGNOSIS — G43909 Migraine, unspecified, not intractable, without status migrainosus: Secondary | ICD-10-CM | POA: Insufficient documentation

## 2018-02-10 DIAGNOSIS — D649 Anemia, unspecified: Secondary | ICD-10-CM | POA: Diagnosis not present

## 2018-02-10 DIAGNOSIS — Z85048 Personal history of other malignant neoplasm of rectum, rectosigmoid junction, and anus: Secondary | ICD-10-CM | POA: Diagnosis not present

## 2018-02-10 DIAGNOSIS — J3801 Paralysis of vocal cords and larynx, unilateral: Secondary | ICD-10-CM | POA: Diagnosis not present

## 2018-02-10 DIAGNOSIS — K219 Gastro-esophageal reflux disease without esophagitis: Secondary | ICD-10-CM | POA: Diagnosis not present

## 2018-02-10 DIAGNOSIS — J3800 Paralysis of vocal cords and larynx, unspecified: Secondary | ICD-10-CM

## 2018-02-10 DIAGNOSIS — Z809 Family history of malignant neoplasm, unspecified: Secondary | ICD-10-CM | POA: Diagnosis not present

## 2018-02-10 DIAGNOSIS — M79672 Pain in left foot: Secondary | ICD-10-CM | POA: Insufficient documentation

## 2018-02-10 DIAGNOSIS — R200 Anesthesia of skin: Secondary | ICD-10-CM | POA: Diagnosis not present

## 2018-02-10 DIAGNOSIS — R911 Solitary pulmonary nodule: Secondary | ICD-10-CM | POA: Insufficient documentation

## 2018-02-10 DIAGNOSIS — R112 Nausea with vomiting, unspecified: Secondary | ICD-10-CM | POA: Insufficient documentation

## 2018-02-10 DIAGNOSIS — Z923 Personal history of irradiation: Secondary | ICD-10-CM | POA: Diagnosis not present

## 2018-02-10 DIAGNOSIS — R490 Dysphonia: Secondary | ICD-10-CM | POA: Diagnosis not present

## 2018-02-10 DIAGNOSIS — R569 Unspecified convulsions: Secondary | ICD-10-CM | POA: Insufficient documentation

## 2018-02-10 DIAGNOSIS — Z79899 Other long term (current) drug therapy: Secondary | ICD-10-CM | POA: Insufficient documentation

## 2018-02-10 HISTORY — PX: LARYNGOPLASTY: SHX282

## 2018-02-10 LAB — CBC WITH DIFFERENTIAL/PLATELET
ABS IMMATURE GRANULOCYTES: 0.03 10*3/uL (ref 0.00–0.07)
BASOS ABS: 0 10*3/uL (ref 0.0–0.1)
BASOS PCT: 1 %
EOS ABS: 0.1 10*3/uL (ref 0.0–0.5)
Eosinophils Relative: 4 %
HCT: 32.7 % — ABNORMAL LOW (ref 39.0–52.0)
Hemoglobin: 10 g/dL — ABNORMAL LOW (ref 13.0–17.0)
IMMATURE GRANULOCYTES: 1 %
Lymphocytes Relative: 10 %
Lymphs Abs: 0.4 10*3/uL — ABNORMAL LOW (ref 0.7–4.0)
MCH: 26.6 pg (ref 26.0–34.0)
MCHC: 30.6 g/dL (ref 30.0–36.0)
MCV: 87 fL (ref 80.0–100.0)
Monocytes Absolute: 0.4 10*3/uL (ref 0.1–1.0)
Monocytes Relative: 11 %
NEUTROS PCT: 73 %
Neutro Abs: 2.9 10*3/uL (ref 1.7–7.7)
PLATELETS: 134 10*3/uL — AB (ref 150–400)
RBC: 3.76 MIL/uL — ABNORMAL LOW (ref 4.22–5.81)
RDW: 18.9 % — AB (ref 11.5–15.5)
WBC: 3.9 10*3/uL — ABNORMAL LOW (ref 4.0–10.5)
nRBC: 0 % (ref 0.0–0.2)

## 2018-02-10 SURGERY — LARYNGOPLASTY
Anesthesia: Monitor Anesthesia Care | Laterality: Left

## 2018-02-10 MED ORDER — ONDANSETRON HCL 4 MG/2ML IJ SOLN: 4.0000 mg | Freq: Once | INTRAMUSCULAR | Status: DC | PRN

## 2018-02-10 MED ORDER — LACTATED RINGERS IV SOLN
INTRAVENOUS | Status: DC
  Administered 2018-02-10: 07:00:00 via INTRAVENOUS

## 2018-02-10 MED ORDER — DEXTROSE-NACL 5-0.45 % IV SOLN
INTRAVENOUS | Status: DC
  Administered 2018-02-10 – 2018-02-11 (×3): via INTRAVENOUS

## 2018-02-10 MED ORDER — PHENOL 1.4 % MT LIQD
1.0000 | OROMUCOSAL | Status: DC | PRN
  Administered 2018-02-10: 1 via OROMUCOSAL
  Filled 2018-02-10: qty 177

## 2018-02-10 MED ORDER — HYDROMORPHONE HCL 1 MG/ML IJ SOLN: 0.2500 mg | INTRAMUSCULAR | Status: DC | PRN

## 2018-02-10 MED ORDER — FENTANYL CITRATE (PF) 100 MCG/2ML IJ SOLN
INTRAMUSCULAR | Status: DC | PRN
  Administered 2018-02-10 (×3): 50 ug via INTRAVENOUS
  Administered 2018-02-10: 25 ug via INTRAVENOUS
  Administered 2018-02-10 (×2): 50 ug via INTRAVENOUS
  Administered 2018-02-10: 100 ug via INTRAVENOUS
  Administered 2018-02-10: 50 ug via INTRAVENOUS
  Administered 2018-02-10: 25 ug via INTRAVENOUS

## 2018-02-10 MED ORDER — ONDANSETRON HCL 4 MG/2ML IJ SOLN
INTRAMUSCULAR | Status: DC | PRN
  Administered 2018-02-10: 4 mg via INTRAVENOUS

## 2018-02-10 MED ORDER — FENTANYL CITRATE (PF) 250 MCG/5ML IJ SOLN
INTRAMUSCULAR | Status: AC
  Filled 2018-02-10: qty 5

## 2018-02-10 MED ORDER — NAPROXEN 250 MG PO TABS: 250.0000 mg | ORAL_TABLET | Freq: Every day | ORAL | Status: DC | PRN

## 2018-02-10 MED ORDER — ONDANSETRON 4 MG PO TBDP: 4.0000 mg | ORAL_TABLET | Freq: Four times a day (QID) | ORAL | Status: DC | PRN

## 2018-02-10 MED ORDER — CLINDAMYCIN HCL 300 MG PO CAPS
300.0000 mg | ORAL_CAPSULE | Freq: Three times a day (TID) | ORAL | Status: DC
  Administered 2018-02-10 – 2018-02-11 (×3): 300 mg via ORAL
  Filled 2018-02-10 (×3): qty 1

## 2018-02-10 MED ORDER — DEXAMETHASONE SODIUM PHOSPHATE 10 MG/ML IJ SOLN
INTRAMUSCULAR | Status: DC | PRN
  Administered 2018-02-10: 10 mg via INTRAVENOUS

## 2018-02-10 MED ORDER — HYDROCODONE-ACETAMINOPHEN 5-325 MG PO TABS
1.0000 | ORAL_TABLET | ORAL | Status: DC | PRN
  Administered 2018-02-10: 2 via ORAL
  Filled 2018-02-10: qty 2

## 2018-02-10 MED ORDER — BACITRACIN ZINC 500 UNIT/GM EX OINT
TOPICAL_OINTMENT | CUTANEOUS | Status: AC
  Filled 2018-02-10: qty 28.35

## 2018-02-10 MED ORDER — MEPERIDINE HCL 50 MG/ML IJ SOLN: 6.2500 mg | INTRAMUSCULAR | Status: DC | PRN

## 2018-02-10 MED ORDER — MORPHINE SULFATE (PF) 2 MG/ML IV SOLN
2.0000 mg | INTRAVENOUS | Status: DC | PRN
  Administered 2018-02-10: 2 mg via INTRAVENOUS
  Filled 2018-02-10: qty 1

## 2018-02-10 MED ORDER — MIDAZOLAM HCL 5 MG/5ML IJ SOLN
INTRAMUSCULAR | Status: DC | PRN
  Administered 2018-02-10 (×2): 1 mg via INTRAVENOUS
  Administered 2018-02-10: 2 mg via INTRAVENOUS

## 2018-02-10 MED ORDER — CHLORHEXIDINE GLUCONATE CLOTH 2 % EX PADS: 6.0000 | MEDICATED_PAD | Freq: Once | CUTANEOUS | Status: DC

## 2018-02-10 MED ORDER — MIDAZOLAM HCL 2 MG/2ML IJ SOLN
INTRAMUSCULAR | Status: AC
  Filled 2018-02-10: qty 2

## 2018-02-10 MED ORDER — PHENYLEPHRINE 40 MCG/ML (10ML) SYRINGE FOR IV PUSH (FOR BLOOD PRESSURE SUPPORT)
PREFILLED_SYRINGE | INTRAVENOUS | Status: DC | PRN
  Administered 2018-02-10: 80 ug via INTRAVENOUS

## 2018-02-10 MED ORDER — LIDOCAINE HCL (PF) 4 % IJ SOLN
INTRAMUSCULAR | Status: AC
  Filled 2018-02-10: qty 5

## 2018-02-10 MED ORDER — LIDOCAINE HCL (PF) 4 % IJ SOLN
INTRAMUSCULAR | Status: DC | PRN
  Administered 2018-02-10: 4 mL

## 2018-02-10 MED ORDER — OXYMETAZOLINE HCL 0.05 % NA SOLN
NASAL | Status: DC | PRN
  Administered 2018-02-10: 1 via TOPICAL

## 2018-02-10 MED ORDER — LIDOCAINE 2% (20 MG/ML) 5 ML SYRINGE
INTRAMUSCULAR | Status: DC | PRN
  Administered 2018-02-10 (×2): 40 mg via INTRAVENOUS
  Administered 2018-02-10 (×2): 60 mg via INTRAVENOUS

## 2018-02-10 MED ORDER — LIDOCAINE-EPINEPHRINE 1 %-1:100000 IJ SOLN
INTRAMUSCULAR | Status: DC | PRN
  Administered 2018-02-10: 6 mL

## 2018-02-10 MED ORDER — BACITRACIN ZINC 500 UNIT/GM EX OINT
TOPICAL_OINTMENT | CUTANEOUS | Status: DC | PRN
  Administered 2018-02-10: 1 via TOPICAL

## 2018-02-10 MED ORDER — LIDOCAINE-EPINEPHRINE 1 %-1:100000 IJ SOLN
INTRAMUSCULAR | Status: AC
  Filled 2018-02-10: qty 1

## 2018-02-10 MED ORDER — OXYMETAZOLINE HCL 0.05 % NA SOLN
NASAL | Status: AC
  Filled 2018-02-10: qty 15

## 2018-02-10 MED ORDER — PROPOFOL 500 MG/50ML IV EMUL
INTRAVENOUS | Status: DC | PRN
  Administered 2018-02-10: 75 ug/kg/min via INTRAVENOUS

## 2018-02-10 MED ORDER — PROCHLORPERAZINE MALEATE 10 MG PO TABS
10.0000 mg | ORAL_TABLET | Freq: Four times a day (QID) | ORAL | Status: DC | PRN
  Administered 2018-02-10: 10 mg via ORAL
  Filled 2018-02-10 (×3): qty 1

## 2018-02-10 MED ORDER — ACETAMINOPHEN 500 MG PO TABS: 500.0000 mg | ORAL_TABLET | Freq: Every day | ORAL | Status: DC | PRN

## 2018-02-10 MED ORDER — ONDANSETRON HCL 4 MG/2ML IJ SOLN
4.0000 mg | Freq: Four times a day (QID) | INTRAMUSCULAR | Status: DC | PRN
  Administered 2018-02-10: 4 mg via INTRAVENOUS
  Filled 2018-02-10: qty 2

## 2018-02-10 MED ORDER — TIZANIDINE HCL 2 MG PO TABS: 2.0000 mg | ORAL_TABLET | Freq: Three times a day (TID) | ORAL | Status: DC | PRN

## 2018-02-10 SURGICAL SUPPLY — 41 items
ATTRACTOMAT 16X20 MAGNETIC DRP (DRAPES) IMPLANT
BLADE SURG 10 STRL SS (BLADE) ×2 IMPLANT
BLADE SURG 15 STRL LF DISP TIS (BLADE) IMPLANT
BLADE SURG 15 STRL SS (BLADE)
BLOCK SILICONE 1.91X1.91 (Miscellaneous) ×2 IMPLANT
BUR DIAMOND COARSE 3.0 (BURR) ×2 IMPLANT
CLEANER TIP ELECTROSURG 2X2 (MISCELLANEOUS) ×2 IMPLANT
COVER SURGICAL LIGHT HANDLE (MISCELLANEOUS) ×2 IMPLANT
COVER WAND RF STERILE (DRAPES) ×2 IMPLANT
CRADLE DONUT ADULT HEAD (MISCELLANEOUS) IMPLANT
DECANTER SPIKE VIAL GLASS SM (MISCELLANEOUS) IMPLANT
DRAPE HALF SHEET 40X57 (DRAPES) ×2 IMPLANT
ELECT COATED BLADE 2.86 ST (ELECTRODE) IMPLANT
ELECT REM PT RETURN 9FT ADLT (ELECTROSURGICAL) ×2
ELECTRODE REM PT RTRN 9FT ADLT (ELECTROSURGICAL) ×1 IMPLANT
GAUZE SPONGE 4X4 12PLY STRL (GAUZE/BANDAGES/DRESSINGS) ×2 IMPLANT
GLOVE ECLIPSE 7.5 STRL STRAW (GLOVE) ×6 IMPLANT
GOWN STRL REUS W/ TWL LRG LVL3 (GOWN DISPOSABLE) ×2 IMPLANT
GOWN STRL REUS W/TWL LRG LVL3 (GOWN DISPOSABLE) ×2
KIT BASIN OR (CUSTOM PROCEDURE TRAY) ×2 IMPLANT
KIT TURNOVER KIT B (KITS) ×2 IMPLANT
NEEDLE HYPO 25GX1X1/2 BEV (NEEDLE) IMPLANT
NS IRRIG 1000ML POUR BTL (IV SOLUTION) ×2 IMPLANT
PAD ARMBOARD 7.5X6 YLW CONV (MISCELLANEOUS) ×2 IMPLANT
PATCH GORETEX 5X7 (Vascular Products) ×2 IMPLANT
PATTIES SURGICAL .5 X3 (DISPOSABLE) ×2 IMPLANT
PENCIL FOOT CONTROL (ELECTRODE) ×2 IMPLANT
RUBBERBAND STERILE (MISCELLANEOUS) ×2 IMPLANT
SLEEVE SCD COMPRESS KNEE MED (MISCELLANEOUS) ×2 IMPLANT
SPONGE INTESTINAL PEANUT (DISPOSABLE) IMPLANT
SUT CHROMIC 4 0 P 3 18 (SUTURE) ×2 IMPLANT
SUT ETHILON 3 0 PS 1 (SUTURE) IMPLANT
SUT ETHILON 4 0 FS 1 (SUTURE) IMPLANT
SUT ETHILON 4 0 P 3 18 (SUTURE) ×2 IMPLANT
SUT ETHILON 5 0 P 3 18 (SUTURE) ×1
SUT NYLON ETHILON 5-0 P-3 1X18 (SUTURE) ×1 IMPLANT
SUT SILK 3 0 TIES 10X30 (SUTURE) IMPLANT
SYR CONTROL 10ML LL (SYRINGE) ×2 IMPLANT
TOWEL OR 17X24 6PK STRL BLUE (TOWEL DISPOSABLE) ×2 IMPLANT
TRAY ENT MC OR (CUSTOM PROCEDURE TRAY) ×2 IMPLANT
WATER STERILE IRR 1000ML POUR (IV SOLUTION) ×2 IMPLANT

## 2018-02-10 NOTE — Transfer of Care (Signed)
Immediate Anesthesia Transfer of Care Note  Patient: Jacob [name]  Procedure(s) Performed: LARYNGOPLASTY (Left )  Patient Location: PACU  Anesthesia Type:General  Level of Consciousness: awake, alert  and oriented  Airway & Oxygen Therapy: Patient Spontanous Breathing and Patient connected to face mask oxygen  Post-op Assessment: Report given to RN and Post -op Vital signs reviewed and stable  Post vital signs: Reviewed and stable  Last Vitals:  Vitals Value Taken Time  BP 126/93 02/10/2018 10:43 AM  Temp    Pulse 79 02/10/2018 10:46 AM  Resp 16 02/10/2018 10:46 AM  SpO2 100 % 02/10/2018 10:46 AM  Vitals shown include unvalidated device data.  Last Pain:  Vitals:   02/10/18 1045  TempSrc:   PainSc: (P) 0-No pain         Complications: No apparent anesthesia complications

## 2018-02-10 NOTE — Anesthesia Postprocedure Evaluation (Signed)
Anesthesia Post Note  Patient: Jacob [name]  Procedure(s) Performed: LARYNGOPLASTY (Left )     Patient location during evaluation: PACU Anesthesia Type: MAC Level of consciousness: awake and alert Pain management: pain level controlled Vital Signs Assessment: post-procedure vital signs reviewed and stable Respiratory status: spontaneous breathing Cardiovascular status: blood pressure returned to baseline Postop Assessment: no apparent nausea or vomiting Anesthetic complications: no    Last Vitals:  Vitals:   02/10/18 1215 02/10/18 1249  BP: (!) 149/98 (!) 147/93  Pulse: 80 85  Resp: 16 16  Temp: (!) 36.1 C (!) 36.3 C  SpO2: 100% 100%    Last Pain:  Vitals:   02/10/18 1456  TempSrc:   PainSc: 7                  Marrie Chandra DAVID

## 2018-02-10 NOTE — Op Note (Signed)
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 He's having trouble with his voice and some aspiration. A vocal cord injection was attempted and it did not seem to help him. He now wants a laryngoplasty. We discussed the procedure. We discussed risks, benefits, and options. All his questions are answered and consent was obtained. Procedure: Patient was taken the operative placed in the supine position after some sedation and pledgets with lidocaine and Afrin placed in the left nose he was prepped and draped in the usual sterile manner. His anatomy was outlined and an incision was made overlying his thyroid cartilage mid point. His dissected down to the strap muscles. The strap muscles were dissected off the thyroid cartilage. It was immediately identified that his thyroid cartilage was calcified to bone. A window was outlined about a couple of millimeters above the inferior border of the thyroid cartilage and about 5 mm from the anterior portion. This was then opened with a #3 cutting bur. By dissection was carried down to the perichondrium and then broken with the [facility]. Dissection was then carried out medial to the thyroid cartilage with the [facility] back toward the arytenoid. The scope was then passed into his left nose and examined the larynx. His vocal cord seemed to medializewith this position of the window.the implant was then fashioned and inserted. It was significantly strained voice. It was trimmed smaller. Placed back in position he still had a strained voice. The [place] [facility] was then used to just gently and minimally medialize his perichondrium and his voice was significantly better. Because of the minimal amount of medialization that it was requiring a Gore-Tex  material was used. It was cut in about 3-4 mm strips and then it was layered into the window . After 1 layered he is voice was tested. Another layer and then a slight manipulation of it pushing it posterior that seem to be his best voice. The cortex was cut and tucked into the window under the anterior lip. There was hemostasis achieved by electrocautery and one vessel in the midline in the strap muscle was clamped I with 3-0 silk. Wound was irrigated. The strap muscles reapproximated. Subcutaneous was loosely closed with interrupted 3-0 chromic. A rubber band drain was placed medial to the strap muscles brought through the midline and then out the wound. It was secured with 4-0 nylon. The wound was loosely closed with a 4-0 nylon. The patient's voice was again tested and the scope was placed into his nose examining the larynx. The vocal cord was medialized but the posterior glottis area still had a gap. His voice sounded significantly stronger. He was then awakened brought to recovery in stable condition counts correct.

## 2018-02-10 NOTE — H&P (Signed)
Jacob [name] is an 47 y.o. male.   Chief Complaint: vocal cord paralysis HPI: hx of TVC paralysis and hoarseness. He has had injection without relief. He wants laryngoplasty  Past Medical History:  Diagnosis Date  . Allergic state 01/19/2012  . Anemia   . 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 Chicken pox as a child   ?  Marland Kitchen Dysrhythmia as child   HX OF IRREGULAR HB AT TIME OF TONSILLECTOMY - SURGERY WAS NOT DONE-CAN'T REMEMBER ANY OTHER DETAILS- NEVER HAD THE SURGERY.  . Elevated liver function tests 01/19/2012  . Fatty liver   . Hernia 6 months old  . History of diarrhea    better since chemo finished  . Hx of migraines   . Lung abnormality 2015  . Lung nodule 08/2013  . Lung nodule, multiple 11/29/2013  . Migraine headache as a child  . Numbness    TOES OF BOTH FEET.  Marland Kitchen Overweight(278.02) 01/19/2012  . Pain    LEFT HEEL PAIN -SEVERE ESPECIALLY AFTER SITTING OR LYING DOWN - DIFFICULT TO GET OUT OF BED AND WALK IN THE MORNINGS BECAUSE OF HEEL PAIN  . 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 Preventative health care 01/19/2012  . Radiation 03/20/12-04/27/12   Pelvis 50.4 gray Rectal cancer  . Rectal cancer ([place]) 03/06/12   biopsy-adenocarcinoma  . Reflux 01/19/2012  . Sun-damaged skin 01/19/2012    Past Surgical History:  Procedure Laterality Date  . CHOLECYSTECTOMY N/A 09/17/2013   Procedure: CHOLECYSTECTOMY;  Surgeon: Stark Klein, MD;  Location: [place];  Service: General;  Laterality: N/A;  . COLOSTOMY TAKEDOWN N/A 08/17/2016   Procedure: LAPAROSCOPIC ABDOMINOPERINEAL RESECTION WITH PERMANENT COLOSTOMY;  Surgeon: Leighton Ruff, MD;  Location: [facility];  Service: General;  Laterality: N/A;  . CYST EXCISION     L EAR AREA  . EUS  03/14/2012   Procedure: LOWER ENDOSCOPIC ULTRASOUND (EUS);  Surgeon: Milus Banister, MD;  Location: [facility];  Service: Endoscopy;  Laterality: N/A;  .  HERNIA REPAIR  6 months old   right inguinal repair  . ILEOSTOMY CLOSURE N/A 12/07/2012   Procedure: ILEOSTOMY TAKEDOWN;  Surgeon: Leighton Ruff, MD;  Location: [facility];  Service: General;  Laterality: N/A;  . LAPAROSCOPIC LOW ANTERIOR RESECTION N/A 06/29/2012   Procedure: LAPAROSCOPIC LOW ANTERIOR RESECTION, mobilization splenic flexure,coloanal anastomosis,diverting ileostomy;  Surgeon: Leighton Ruff, MD;  Location: [facility];  Service: General;  Laterality: N/A;  . LAPAROSCOPY N/A 09/17/2013   Procedure: LAPAROSCOPY DIAGNOSTIC;  Surgeon: Stark Klein, MD;  Location: [place];  Service: General;  Laterality: N/A;  . LIVER ULTRASOUND N/A 09/17/2013   Procedure: LIVER ULTRASOUND;  Surgeon: Stark Klein, MD;  Location: [place];  Service: General;  Laterality: N/A;  . MICROLARYNGOSCOPY W/VOCAL CORD INJECTION Left 05/16/2017   Procedure: MICROLARYNGOSCOPY WITH VOCAL CORD INJECTION;  Surgeon: Melissa Montane, MD;  Location: [place];  Service: ENT;  Laterality: Left;  . OPEN HEPATECTOMY  N/A 09/17/2013   Procedure: OPEN HEPATECTOMY;  Surgeon: Stark Klein, MD;  Location: [place];  Service: General;  Laterality: N/A;  . PORTACATH PLACEMENT Left 03/21/2013   Procedure: INSERTION PORT-A-CATH;  Surgeon: Leighton Ruff, MD;  Location: [facility];  Service: General;  Laterality: Left;  . RECTAL BIOPSY  03/06/12   distal mass1-2cm from anal verge=Adenocarcinoma  . TONSILLECTOMY      Family History  Adopted: Yes  Problem Relation  Age of Onset  . Cancer Father   . Heart disease Father   . Colon cancer Neg Hx    Social History:  reports that he has never smoked. He has never used smokeless tobacco. He reports that he drank alcohol. He reports that he does not use drugs.  Allergies:  Allergies  Allergen Reactions  . Alprazolam Other (See Comments)    Excessive sedation  . Penicillins Rash    Childhood allergy Has patient had a PCN reaction causing immediate rash, facial/tongue/throat swelling, SOB or  lightheadedness with hypotension: Unknown Has patient had a PCN reaction causing severe rash involving mucus membranes or skin necrosis: Unknown Has patient had a PCN reaction that required hospitalization: No Has patient had a PCN reaction occurring within the last 10 years: No If all of the above answers are "NO", then may proceed with Cephalosporin use.     Medications Prior to Admission  Medication Sig Dispense Refill  . acetaminophen (TYLENOL) 500 MG tablet Take 500-1,000 mg by mouth daily as needed for moderate pain.    Marland Kitchen docusate sodium (STOOL SOFTENER) 100 MG capsule Take 100 mg by mouth 2 (two) times daily as needed (constipation).     Marland Kitchen dronabinol (MARINOL) 5 MG capsule Take 1 capsule (5 mg total) by mouth 3 (three) times daily as needed. (Patient taking differently: Take 5 mg by mouth 3 (three) times daily as needed (appetite). ) 90 capsule 3  . lamoTRIgine (LAMICTAL) 100 MG tablet Take 1 tablet (100 mg total) by mouth 2 (two) times daily. 60 tablet 3  . lidocaine-prilocaine (EMLA) cream Apply 1 application topically as needed (prior to port access).    . Multiple Vitamin (MULTIVITAMIN WITH MINERALS) TABS tablet Take 1 tablet by mouth 3 (three) times a week.    . naproxen sodium (ALEVE) 220 MG tablet Take 220 mg by mouth daily as needed (pain).    . prochlorperazine (COMPAZINE) 10 MG tablet Take 10 mg by mouth every 6 (six) hours as needed for nausea or vomiting.    Marland Kitchen tiZANidine (ZANAFLEX) 4 MG tablet Take 0.5-1 tablets (2-4 mg total) by mouth every 8 (eight) hours as needed for muscle spasms. 30 tablet 1    Results for orders placed or performed during the hospital encounter of 02/10/18 (from the past 48 hour(s))  CBC with Differential/Platelet     Status: Abnormal   Collection Time: 02/10/18  7:07 AM  Result Value Ref Range   WBC 3.9 (L) 4.0 - 10.5 K/uL   RBC 3.76 (L) 4.22 - 5.81 MIL/uL   Hemoglobin 10.0 (L) 13.0 - 17.0 g/dL   HCT 32.7 (L) 39.0 - 52.0 %   MCV 87.0 80.0 -  100.0 fL   MCH 26.6 26.0 - 34.0 pg   MCHC 30.6 30.0 - 36.0 g/dL   RDW 18.9 (H) 11.5 - 15.5 %   Platelets 134 (L) 150 - 400 K/uL   nRBC 0.0 0.0 - 0.2 %   Neutrophils Relative % 73 %   Neutro Abs 2.9 1.7 - 7.7 K/uL   Lymphocytes Relative 10 %   Lymphs Abs 0.4 (L) 0.7 - 4.0 K/uL   Monocytes Relative 11 %   Monocytes Absolute 0.4 0.1 - 1.0 K/uL   Eosinophils Relative 4 %   Eosinophils Absolute 0.1 0.0 - 0.5 K/uL   Basophils Relative 1 %   Basophils Absolute 0.0 0.0 - 0.1 K/uL   Immature Granulocytes 1 %   Abs Immature Granulocytes 0.03 0.00 - 0.07 K/uL  Comment: Performed at [facility], [place] [address], [place], [place] [address]   *Note: Due to a large number of results and/or encounters for the requested time period, some results have not been displayed. A complete set of results can be found in Results Review.   No results found.  Review of Systems  Constitutional: Negative.   HENT: Negative.   Eyes: Negative.   Respiratory: Negative.   Cardiovascular: Negative.   Skin: Negative.     Blood pressure (!) 129/95, pulse 91, temperature 97.9 F (36.6 C), temperature source Oral, resp. rate 18, height 5\' 10"  (1.778 m), weight 65 kg, SpO2 100 %. Physical Exam  Constitutional: He appears well-developed.  HENT:  Head: Normocephalic and atraumatic.  Mouth/Throat: Oropharynx is clear and moist.  Eyes: Pupils are equal, round, and reactive to light. Conjunctivae are normal.  Neck: Normal range of motion. Neck supple.  Cardiovascular: Normal rate.  Respiratory: Effort normal.  GI: Soft.  Musculoskeletal: Normal range of motion.     Assessment/Plan Left vocal cord paralysis- discussed laryngoplasty and ready to proceed  Melissa Montane, MD 02/10/2018, 8:51 AM

## 2018-02-10 NOTE — Anesthesia Preprocedure Evaluation (Signed)
Anesthesia Evaluation  Patient identified by MRN, date of birth, ID band Patient awake    Reviewed: Allergy & Precautions, NPO status , Patient's Chart, lab work & pertinent test results  Airway Mallampati: I  TM Distance: >3 FB Neck ROM: Full    Dental   Pulmonary    Pulmonary exam normal        Cardiovascular Normal cardiovascular exam     Neuro/Psych Seizures -, Well Controlled,     GI/Hepatic   Endo/Other    Renal/GU      Musculoskeletal   Abdominal   Peds  Hematology   Anesthesia Other Findings   Reproductive/Obstetrics                             Anesthesia Physical Anesthesia Plan  ASA: II  Anesthesia Plan: MAC   Post-op Pain Management:    Induction: Intravenous  PONV Risk Score and Plan: 1 and Ondansetron  Airway Management Planned: Simple Face Mask  Additional Equipment:   Intra-op Plan:   Post-operative Plan:   Informed Consent: I have reviewed the patients History and Physical, chart, labs and discussed the procedure including the risks, benefits and alternatives for the proposed anesthesia with the patient or authorized representative who has indicated his/her understanding and acceptance.     Plan Discussed with: CRNA and Surgeon  Anesthesia Plan Comments:         Anesthesia Quick Evaluation

## 2018-02-10 NOTE — Progress Notes (Signed)
ENT Post Operative Note  Subjective: + nausea, +vomiting-improving No difficulty voiding Pain well controlled  Vitals:   02/10/18 1249 02/10/18 1732  BP: (!) 147/93 (!) 139/95  Pulse: 85 78  Resp: 16 16  Temp: (!) 97.3 F (36.3 C) 98 F (36.7 C)  SpO2: 100% 100%     OBJECTIVE  Gen: alert, cooperative, appropriate Head/ENT: EOMI, neck supple, mucus membranes moist and pink, conjunctiva clear Incisions c/d/i, rubberband drain in place Face moves symmetrically Respiratory: Voice with dysphonia. non-labored breathing, no accessory muscle use, normal HR, good O2 saturations  ASSESS/ PLAN  Jacob [name] is a 47 y.o. male who is POD 0 from medialization laryngoplasty.  -pain control -MIVF- will saline lock when tolerating PO intake -Patient is to be on strict voice rest for 1 week.  This includes no talking, yelling, whispering. He should be writing to communicate. -States overnight.  Likely home tomorrow morning.  Helayne Seminole, MD

## 2018-02-11 ENCOUNTER — Other Ambulatory Visit: Payer: Self-pay | Admitting: Nurse Practitioner

## 2018-02-11 DIAGNOSIS — J3801 Paralysis of vocal cords and larynx, unilateral: Secondary | ICD-10-CM | POA: Diagnosis not present

## 2018-02-11 DIAGNOSIS — C787 Secondary malignant neoplasm of liver and intrahepatic bile duct: Secondary | ICD-10-CM

## 2018-02-11 LAB — HIV ANTIBODY (ROUTINE TESTING W REFLEX): HIV Screen 4th Generation wRfx: NONREACTIVE

## 2018-02-11 MED ORDER — CLINDAMYCIN HCL 300 MG PO CAPS: 300.0000 mg | ORAL_CAPSULE | Freq: Three times a day (TID) | ORAL | 0 refills | Status: AC

## 2018-02-11 NOTE — Discharge Instructions (Addendum)
-  Cleanse wound with 1/2 strength hydrogen peroxide on a Q tip twice daily. Pat dry. Apply bacitracin ointment along incision.  -Keep drain site clean in a similar fashion.  -You may shower like you normally would in 48 hours. Do not scrub over incision. Pat dry.  -No strenuous exercise or heavy lifting until 14 days after surgery. Walk/ambulate at home often. Perform calf and stretch exercises often to prevent blood clot formation.  -Tylenol PRN pain. Ice packs as needed for pain as well.  -VOICE REST FOR ONE WEEK

## 2018-02-11 NOTE — Discharge Summary (Signed)
Physician Discharge Summary  Patient ID: TABER SWEETSER MRN: [id number] DOB/AGE: 1970/11/12 47 y.o.  Admit date: 02/10/2018 Discharge date: 02/11/2018  Admission Diagnoses:  Discharge Diagnoses:  Active Problems:   Vocal cord paralysis LEFT  Discharged Condition: good  Hospital Course: The patient underwent medialization laryngoplasty of the LEFT vocal cord with Gortex implant on 02/10/18. The patient tolerated the procedure well and went to the post-operative surgical floor afterwards. He was placed on voice rest for one week. Drain removed on POD#1. Tolerating regular diet, pain controlled, ambulating, no respiratory distress. Discharged on POD #1.   Consults: None  Significant Diagnostic Studies: none  Treatments: IV hydration and analgesia: acetaminophen  Discharge Exam: Blood pressure (!) 123/95, pulse 69, temperature 97.7 F (36.5 C), temperature source Oral, resp. rate 16, height 5\' 10"  (1.778 m), weight 68.1 kg, SpO2 100 %. Gen: alert, cooperative, appropriate Head/ENT: EOMI, neck supple, mucus membranes moist and pink, conjunctiva clear Incisions c/d/i, rubberband drain in place- removed Face moves symmetrically Respiratory: Voice with dysphonia. non-labored breathing, no accessory muscle use, normal HR, good O2 saturations  Disposition: Discharge disposition: 01-Home or Self Care        Allergies as of 02/11/2018      Reactions   Alprazolam Other (See Comments)   Excessive sedation   Penicillins Rash   Childhood allergy Has patient had a PCN reaction causing immediate rash, facial/tongue/throat swelling, SOB or lightheadedness with hypotension: Unknown Has patient had a PCN reaction causing severe rash involving mucus membranes or skin necrosis: Unknown Has patient had a PCN reaction that required hospitalization: No Has patient had a PCN reaction occurring within the last 10 years: No If all of the above answers are "NO", then may proceed with Cephalosporin  use.      Medication List    STOP taking these medications   naproxen sodium 220 MG tablet Commonly known as:  ALEVE     TAKE these medications   acetaminophen 500 MG tablet Commonly known as:  TYLENOL Take 500-1,000 mg by mouth daily as needed for moderate pain.   clindamycin 300 MG capsule Commonly known as:  CLEOCIN Take 1 capsule (300 mg total) by mouth every 8 (eight) hours for 7 days.   dronabinol 5 MG capsule Commonly known as:  MARINOL Take 1 capsule (5 mg total) by mouth 3 (three) times daily as needed. What changed:  reasons to take this   lamoTRIgine 100 MG tablet Commonly known as:  LAMICTAL Take 1 tablet (100 mg total) by mouth 2 (two) times daily.   lidocaine-prilocaine cream Commonly known as:  EMLA Apply 1 application topically as needed (prior to port access).   multivitamin with minerals Tabs tablet Take 1 tablet by mouth 3 (three) times a week.   prochlorperazine 10 MG tablet Commonly known as:  COMPAZINE Take 10 mg by mouth every 6 (six) hours as needed for nausea or vomiting.   STOOL SOFTENER 100 MG capsule Generic drug:  docusate sodium Take 100 mg by mouth 2 (two) times daily as needed (constipation).   tiZANidine 4 MG tablet Commonly known as:  ZANAFLEX Take 0.5-1 tablets (2-4 mg total) by mouth every 8 (eight) hours as needed for muscle spasms.      Follow-up Information    Melissa Montane, MD Follow up.   Specialty:  Otolaryngology Why:  call and schedule your followup appt Contact information: [address] [place] [address] [phone]  Signed: [place] Jetty Marcellino 02/11/2018, 10:11 AM

## 2018-02-11 NOTE — Progress Notes (Signed)
Renold Genta to be D/C'd  per MD order. Discussed with the patient and all questions fully answered.  VSS, Skin clean, dry and intact without evidence of skin break down, no evidence of skin tears noted.  IV catheter discontinued intact. Site without signs and symptoms of complications. Dressing and pressure applied.  An After Visit Summary was printed and given to the patient. Patient informed where to pick up prescription.  D/c education completed with patient/family including follow up instructions, medication list, d/c activities limitations if indicated, with other d/c instructions as indicated by MD - patient able to verbalize understanding, all questions fully answered.   Patient instructed to return to ED, call 911, or call MD for any changes in condition.   Patient to be escorted via [place], and D/C home via private auto.

## 2018-02-12 ENCOUNTER — Other Ambulatory Visit: Payer: Self-pay | Admitting: Oncology

## 2018-02-13 ENCOUNTER — Ambulatory Visit
Admission: RE | Admit: 2018-02-13 | Discharge: 2018-02-13 | Disposition: A | Discharge status: Home / Self Care | Payer: BC Managed Care – PPO | Source: Ambulatory Visit | Attending: Internal Medicine | Admitting: Internal Medicine

## 2018-02-13 ENCOUNTER — Encounter (HOSPITAL_COMMUNITY): Payer: Self-pay | Admitting: Otolaryngology

## 2018-02-13 ENCOUNTER — Other Ambulatory Visit: Payer: Self-pay | Admitting: *Deleted

## 2018-02-13 DIAGNOSIS — C7931 Secondary malignant neoplasm of brain: Secondary | ICD-10-CM

## 2018-02-13 MED ORDER — GADOBENATE DIMEGLUMINE 529 MG/ML IV SOLN
13.0000 mL | Freq: Once | INTRAVENOUS | Status: AC | PRN
  Administered 2018-02-13: 13 mL via INTRAVENOUS

## 2018-02-13 MED ORDER — LIDOCAINE-PRILOCAINE 2.5-2.5 % EX CREA: 1.0000 "application " | TOPICAL_CREAM | CUTANEOUS | 1 refills | Status: AC | PRN

## 2018-02-14 ENCOUNTER — Other Ambulatory Visit: Payer: Self-pay | Admitting: Radiation Therapy

## 2018-02-15 ENCOUNTER — Telehealth: Payer: Self-pay | Admitting: Oncology

## 2018-02-15 ENCOUNTER — Inpatient Hospital Stay: Payer: BC Managed Care – PPO

## 2018-02-15 ENCOUNTER — Telehealth: Payer: Self-pay | Admitting: *Deleted

## 2018-02-15 ENCOUNTER — Inpatient Hospital Stay: Payer: BC Managed Care – PPO | Admitting: Oncology

## 2018-02-15 NOTE — Telephone Encounter (Signed)
Patient called to cancel all his appointments today--not feeling well. Had been constipated for several days with vomiting last light. Started taking his MiraLax and bowels are beginning to move now with less nausea. He denies abdomen is firm or distended. Encouraged him to stay on clear liquids till bowels/nausea are resolved. Discussed s/s of bowel obstruction and to call or go to ER if after hours. He understands and agrees. Scheduling message sent and infusion charge nurse notified.

## 2018-02-15 NOTE — Telephone Encounter (Signed)
Tried to call regarding 12/4

## 2018-02-17 ENCOUNTER — Inpatient Hospital Stay (HOSPITAL_BASED_OUTPATIENT_CLINIC_OR_DEPARTMENT_OTHER): Payer: BC Managed Care – PPO | Admitting: Internal Medicine

## 2018-02-17 ENCOUNTER — Encounter: Payer: Self-pay | Admitting: Internal Medicine

## 2018-02-17 ENCOUNTER — Inpatient Hospital Stay: Payer: BC Managed Care – PPO

## 2018-02-17 VITALS — BP 118/84 | HR 97 | Temp 98.7°F | Resp 18 | Ht 70.0 in | Wt 143.2 lb

## 2018-02-17 DIAGNOSIS — R569 Unspecified convulsions: Secondary | ICD-10-CM | POA: Diagnosis not present

## 2018-02-17 DIAGNOSIS — C7931 Secondary malignant neoplasm of brain: Secondary | ICD-10-CM | POA: Diagnosis not present

## 2018-02-17 DIAGNOSIS — Z5111 Encounter for antineoplastic chemotherapy: Secondary | ICD-10-CM | POA: Diagnosis not present

## 2018-02-17 DIAGNOSIS — G939 Disorder of brain, unspecified: Secondary | ICD-10-CM | POA: Diagnosis not present

## 2018-02-17 NOTE — Progress Notes (Signed)
[facility] [place], [place] [address] [phone]   Interval Evaluation  Date of Service: 02/17/18 Patient Name: Jacob [name] Patient MRN: [id number] Patient DOB: Sep 08, 1970 Provider: Ventura Sellers, MD  Identifying Statement:  Jacob [name] is a 47 y.o. 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[place])    03/20/2012 - 04/27/2012 Radiation Therapy    RT for 28 Fractions w / Xeloda     04/30/2015 - 01/18/2018 Chemotherapy    The patient had palonosetron (ALOXI) injection 0.25 mg, 0.25 mg, Intravenous,  Once, 30 of 32 cycles Administration: 0.25 mg (05/21/2015), 0.25 mg (06/05/2015), 0.25 mg (06/19/2015), 0.25 mg (07/10/2015), 0.25 mg (07/24/2015), 0.25 mg (08/28/2015), 0.25 mg (09/11/2015), 0.25 mg (09/25/2015), 0.25 mg (10/09/2015), 0.25 mg (10/23/2015), 0.25 mg (11/04/2016), 0.25 mg (11/18/2016), 0.25 mg (12/02/2016), 0.25 mg (12/15/2016), 0.25 mg (12/30/2016), 0.25 mg (01/13/2017), 0.25 mg (01/27/2017), 0.25 mg (02/17/2017), 0.25 mg (03/03/2017), 0.25 mg (03/24/2017), 0.25 mg (04/07/2017), 0.25 mg (04/21/2017), 0.25 mg (05/05/2017), 0.25 mg (08/19/2017), 0.25 mg (09/01/2017), 0.25 mg (11/03/2017), 0.25 mg (11/17/2017), 0.25 mg (12/08/2017), 0.25 mg (12/22/2017), 0.25 mg (01/05/2018) pegfilgrastim (NEULASTA) injection 6 mg, 6 mg, Subcutaneous, Once, 23 of 23 cycles Administration: 6 mg (05/23/2015), 6 mg (06/07/2015), 6 mg (06/21/2015), 6 mg (07/12/2015), 6 mg (07/26/2015), 6 mg (08/30/2015), 6 mg (09/13/2015), 6 mg (09/27/2015), 6 mg (10/11/2015), 6 mg (10/25/2015), 6 mg (11/06/2016), 6 mg (11/20/2016), 6 mg (12/04/2016), 6 mg (12/17/2016), 6 mg (01/01/2017), 6 mg (01/15/2017), 6 mg (01/29/2017), 6 mg (02/19/2017), 6 mg (03/05/2017), 6 mg (03/26/2017), 6 mg (04/09/2017), 6 mg (04/23/2017), 6 mg (05/07/2017) pegfilgrastim-cbqv (UDENYCA)  injection 6 mg, 6 mg, Subcutaneous, Once, 6 of 8 cycles Administration: 6 mg (08/22/2017), 6 mg (11/05/2017), 6 mg (11/19/2017), 6 mg (12/10/2017), 6 mg (12/24/2017), 6 mg (01/07/2018) irinotecan (CAMPTOSAR) 258 mg in dextrose 5 % 500 mL chemo infusion, 135 mg/m2 = 258 mg (75 % of original dose 180 mg/m2), Intravenous,  Once, 30 of 32 cycles Dose modification: 135 mg/m2 (original dose 180 mg/m2, Cycle 1, Reason: Provider Judgment), 100 mg/m2 (original dose 180 mg/m2, Cycle 24, Reason: Provider Judgment) Administration: 258 mg (05/21/2015), 260 mg (06/05/2015), 258 mg (06/19/2015), 258 mg (07/10/2015), 260 mg (07/24/2015), 260 mg (08/28/2015), 260 mg (09/11/2015), 260 mg (09/25/2015), 260 mg (10/23/2015), 260 mg (11/04/2016), 240 mg (11/18/2016), 240 mg (12/02/2016), 240 mg (12/15/2016), 240 mg (12/30/2016), 240 mg (01/13/2017), 240 mg (01/27/2017), 240 mg (02/17/2017), 240 mg (03/03/2017), 240 mg (03/24/2017), 240 mg (04/07/2017), 240 mg (04/21/2017), 240 mg (05/05/2017), 180 mg (08/19/2017), 180 mg (09/01/2017), 180 mg (11/03/2017), 180 mg (11/17/2017), 180 mg (12/08/2017), 180 mg (12/22/2017), 180 mg (01/05/2018) leucovorin 764 mg in dextrose 5 % 250 mL infusion, 400 mg/m2 = 764 mg, Intravenous,  Once, 30 of 32 cycles Administration: 764 mg (05/21/2015), 764 mg (06/05/2015), 764 mg (06/19/2015), 764 mg (07/10/2015), 760 mg (07/24/2015), 760 mg (08/28/2015), 760 mg (09/11/2015), 760 mg (09/25/2015), 764 mg (10/09/2015), 764 mg (10/23/2015), 764 mg (11/04/2016), 764 mg (11/18/2016), 764 mg (12/02/2016), 764 mg (12/15/2016), 716 mg (12/30/2016), 716 mg (01/13/2017), 716 mg (01/27/2017), 716 mg (02/17/2017), 716 mg (03/03/2017), 716 mg (03/24/2017), 716 mg (04/07/2017), 716 mg (04/21/2017), 716 mg (05/05/2017), 736 mg (08/19/2017), 736 mg (09/01/2017), 736 mg (11/03/2017), 736 mg (11/17/2017), 736 mg (12/08/2017), 736 mg (12/22/2017), 736 mg (01/05/2018) fluorouracil (  ADRUCIL) chemo injection 750 mg, 400 mg/m2 = 750 mg, Intravenous,  Once, 10 of 10 cycles Administration: 750  mg (05/21/2015), 750 mg (06/05/2015), 750 mg (06/19/2015), 750 mg (07/10/2015), 750 mg (07/24/2015), 750 mg (08/28/2015), 750 mg (09/11/2015), 750 mg (09/25/2015), 750 mg (10/09/2015), 750 mg (10/23/2015) leucovorin injection 38 mg, 20 mg/m2 = 38 mg, Intravenous,  Once, 1 of 1 cycle fosaprepitant (EMEND) 150 mg, dexamethasone (DECADRON) 12 mg in sodium chloride 0.9 % 145 mL IVPB, , Intravenous,  Once, 30 of 32 cycles Administration:  (05/21/2015),  (06/05/2015),  (06/19/2015),  (07/10/2015),  (07/24/2015),  (08/28/2015),  (09/11/2015),  (09/25/2015),  (10/09/2015),  (10/23/2015),  (11/04/2016),  (11/18/2016),  (12/02/2016),  (12/15/2016),  (12/30/2016),  (01/13/2017),  (01/27/2017),  (02/17/2017),  (03/03/2017),  (03/24/2017),  (04/07/2017),  (04/21/2017),  (05/05/2017),  (08/19/2017),  (09/01/2017),  (11/03/2017),  (11/17/2017),  (12/08/2017),  (12/22/2017),  (01/05/2018) fluorouracil (ADRUCIL) 4,600 mg in sodium chloride 0.9 % 58 mL chemo infusion, 2,400 mg/m2 = 4,600 mg, Intravenous, 1 Day/Dose, 30 of 32 cycles Administration: 4,600 mg (05/21/2015), 4,600 mg (06/05/2015), 4,600 mg (06/19/2015), 4,600 mg (07/10/2015), 4,600 mg (07/24/2015), 4,600 mg (08/28/2015), 4,600 mg (09/11/2015), 4,600 mg (09/25/2015), 4,600 mg (10/09/2015), 4,600 mg (10/23/2015), 4,600 mg (11/04/2016), 4,600 mg (11/18/2016), 4,600 mg (12/02/2016), 4,600 mg (12/15/2016), 4,300 mg (12/30/2016), 4,300 mg (01/13/2017), 4,300 mg (01/27/2017), 4,300 mg (02/17/2017), 4,300 mg (03/03/2017), 4,300 mg (03/24/2017), 4,300 mg (04/07/2017), 4,300 mg (04/21/2017), 4,300 mg (05/05/2017), 4,300 mg (08/19/2017), 4,400 mg (09/01/2017), 4,400 mg (11/03/2017), 4,400 mg (11/17/2017), 4,400 mg (12/08/2017), 4,400 mg (12/22/2017), 4,400 mg (01/05/2018)  for chemotherapy treatment.     02/02/2018 -  Chemotherapy    The patient had palonosetron (ALOXI) injection 0.25 mg, 0.25 mg, Intravenous,  Once, 1 of 4 cycles Administration: 0.25 mg (02/02/2018) leucovorin 728 mg in dextrose 5 % 250 mL infusion, 400 mg/m2 = 728 mg,  Intravenous,  Once, 1 of 4 cycles Administration: 728 mg (02/02/2018) oxaliplatin (ELOXATIN) 150 mg in dextrose 5 % 500 mL chemo infusion, 82 mg/m2 = 155 mg, Intravenous,  Once, 1 of 4 cycles Administration: 150 mg (02/02/2018) fluorouracil (ADRUCIL) 4,350 mg in sodium chloride 0.9 % 63 mL chemo infusion, 2,400 mg/m2 = 4,350 mg, Intravenous, 1 Day/Dose, 1 of 4 cycles Administration: 4,350 mg (02/02/2018)  for chemotherapy treatment.      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[place])    04/30/2015 - 01/18/2018 Chemotherapy    The patient had palonosetron (ALOXI) injection 0.25 mg, 0.25 mg, Intravenous,  Once, 30 of 32 cycles Administration: 0.25 mg (05/21/2015), 0.25 mg (06/05/2015), 0.25 mg (06/19/2015), 0.25 mg (07/10/2015), 0.25 mg (07/24/2015), 0.25 mg (08/28/2015), 0.25 mg (09/11/2015), 0.25 mg (09/25/2015), 0.25 mg (10/09/2015), 0.25 mg (10/23/2015), 0.25 mg (11/04/2016), 0.25 mg (11/18/2016), 0.25 mg (12/02/2016), 0.25 mg (12/15/2016), 0.25 mg (12/30/2016), 0.25 mg (01/13/2017), 0.25 mg (01/27/2017), 0.25 mg (02/17/2017), 0.25 mg (03/03/2017), 0.25 mg (03/24/2017), 0.25 mg (04/07/2017), 0.25 mg (04/21/2017), 0.25 mg (05/05/2017), 0.25 mg (08/19/2017), 0.25 mg (09/01/2017), 0.25 mg (11/03/2017), 0.25 mg (11/17/2017), 0.25 mg (12/08/2017), 0.25 mg (12/22/2017), 0.25 mg (01/05/2018) pegfilgrastim (NEULASTA) injection 6 mg, 6 mg, Subcutaneous, Once, 23 of 23 cycles Administration: 6 mg (05/23/2015), 6 mg (06/07/2015), 6 mg (06/21/2015), 6 mg (07/12/2015), 6 mg (07/26/2015), 6 mg (08/30/2015), 6 mg (09/13/2015), 6 mg (09/27/2015), 6 mg (10/11/2015), 6 mg (10/25/2015), 6 mg (11/06/2016), 6 mg (11/20/2016), 6 mg (12/04/2016), 6 mg (12/17/2016), 6 mg (01/01/2017), 6 mg (01/15/2017), 6 mg (01/29/2017), 6 mg (  02/19/2017), 6 mg (03/05/2017), 6 mg (03/26/2017), 6 mg (04/09/2017), 6 mg (04/23/2017), 6 mg (05/07/2017) pegfilgrastim-cbqv (UDENYCA) injection 6 mg, 6 mg, Subcutaneous, Once, 6 of 8  cycles Administration: 6 mg (08/22/2017), 6 mg (11/05/2017), 6 mg (11/19/2017), 6 mg (12/10/2017), 6 mg (12/24/2017), 6 mg (01/07/2018) irinotecan (CAMPTOSAR) 258 mg in dextrose 5 % 500 mL chemo infusion, 135 mg/m2 = 258 mg (75 % of original dose 180 mg/m2), Intravenous,  Once, 30 of 32 cycles Dose modification: 135 mg/m2 (original dose 180 mg/m2, Cycle 1, Reason: Provider Judgment), 100 mg/m2 (original dose 180 mg/m2, Cycle 24, Reason: Provider Judgment) Administration: 258 mg (05/21/2015), 260 mg (06/05/2015), 258 mg (06/19/2015), 258 mg (07/10/2015), 260 mg (07/24/2015), 260 mg (08/28/2015), 260 mg (09/11/2015), 260 mg (09/25/2015), 260 mg (10/23/2015), 260 mg (11/04/2016), 240 mg (11/18/2016), 240 mg (12/02/2016), 240 mg (12/15/2016), 240 mg (12/30/2016), 240 mg (01/13/2017), 240 mg (01/27/2017), 240 mg (02/17/2017), 240 mg (03/03/2017), 240 mg (03/24/2017), 240 mg (04/07/2017), 240 mg (04/21/2017), 240 mg (05/05/2017), 180 mg (08/19/2017), 180 mg (09/01/2017), 180 mg (11/03/2017), 180 mg (11/17/2017), 180 mg (12/08/2017), 180 mg (12/22/2017), 180 mg (01/05/2018) leucovorin 764 mg in dextrose 5 % 250 mL infusion, 400 mg/m2 = 764 mg, Intravenous,  Once, 30 of 32 cycles Administration: 764 mg (05/21/2015), 764 mg (06/05/2015), 764 mg (06/19/2015), 764 mg (07/10/2015), 760 mg (07/24/2015), 760 mg (08/28/2015), 760 mg (09/11/2015), 760 mg (09/25/2015), 764 mg (10/09/2015), 764 mg (10/23/2015), 764 mg (11/04/2016), 764 mg (11/18/2016), 764 mg (12/02/2016), 764 mg (12/15/2016), 716 mg (12/30/2016), 716 mg (01/13/2017), 716 mg (01/27/2017), 716 mg (02/17/2017), 716 mg (03/03/2017), 716 mg (03/24/2017), 716 mg (04/07/2017), 716 mg (04/21/2017), 716 mg (05/05/2017), 736 mg (08/19/2017), 736 mg (09/01/2017), 736 mg (11/03/2017), 736 mg (11/17/2017), 736 mg (12/08/2017), 736 mg (12/22/2017), 736 mg (01/05/2018) fluorouracil (ADRUCIL) chemo injection 750 mg, 400 mg/m2 = 750 mg, Intravenous,  Once, 10 of 10 cycles Administration: 750 mg (05/21/2015), 750 mg (06/05/2015), 750 mg  (06/19/2015), 750 mg (07/10/2015), 750 mg (07/24/2015), 750 mg (08/28/2015), 750 mg (09/11/2015), 750 mg (09/25/2015), 750 mg (10/09/2015), 750 mg (10/23/2015) leucovorin injection 38 mg, 20 mg/m2 = 38 mg, Intravenous,  Once, 1 of 1 cycle fosaprepitant (EMEND) 150 mg, dexamethasone (DECADRON) 12 mg in sodium chloride 0.9 % 145 mL IVPB, , Intravenous,  Once, 30 of 32 cycles Administration:  (05/21/2015),  (06/05/2015),  (06/19/2015),  (07/10/2015),  (07/24/2015),  (08/28/2015),  (09/11/2015),  (09/25/2015),  (10/09/2015),  (10/23/2015),  (11/04/2016),  (11/18/2016),  (12/02/2016),  (12/15/2016),  (12/30/2016),  (01/13/2017),  (01/27/2017),  (02/17/2017),  (03/03/2017),  (03/24/2017),  (04/07/2017),  (04/21/2017),  (05/05/2017),  (08/19/2017),  (09/01/2017),  (11/03/2017),  (11/17/2017),  (12/08/2017),  (12/22/2017),  (01/05/2018) fluorouracil (ADRUCIL) 4,600 mg in sodium chloride 0.9 % 58 mL chemo infusion, 2,400 mg/m2 = 4,600 mg, Intravenous, 1 Day/Dose, 30 of 32 cycles Administration: 4,600 mg (05/21/2015), 4,600 mg (06/05/2015), 4,600 mg (06/19/2015), 4,600 mg (07/10/2015), 4,600 mg (07/24/2015), 4,600 mg (08/28/2015), 4,600 mg (09/11/2015), 4,600 mg (09/25/2015), 4,600 mg (10/09/2015), 4,600 mg (10/23/2015), 4,600 mg (11/04/2016), 4,600 mg (11/18/2016), 4,600 mg (12/02/2016), 4,600 mg (12/15/2016), 4,300 mg (12/30/2016), 4,300 mg (01/13/2017), 4,300 mg (01/27/2017), 4,300 mg (02/17/2017), 4,300 mg (03/03/2017), 4,300 mg (03/24/2017), 4,300 mg (04/07/2017), 4,300 mg (04/21/2017), 4,300 mg (05/05/2017), 4,300 mg (08/19/2017), 4,400 mg (09/01/2017), 4,400 mg (11/03/2017), 4,400 mg (11/17/2017), 4,400 mg (12/08/2017), 4,400 mg (12/22/2017), 4,400 mg (01/05/2018)  for chemotherapy treatment.     02/02/2018 -  Chemotherapy    The patient had palonosetron (ALOXI) injection 0.25 mg, 0.25 mg, Intravenous,  Once,  1 of 4 cycles Administration: 0.25 mg (02/02/2018) leucovorin 728 mg in dextrose 5 % 250 mL infusion, 400 mg/m2 = 728 mg, Intravenous,  Once, 1 of 4 cycles Administration:  728 mg (02/02/2018) oxaliplatin (ELOXATIN) 150 mg in dextrose 5 % 500 mL chemo infusion, 82 mg/m2 = 155 mg, Intravenous,  Once, 1 of 4 cycles Administration: 150 mg (02/02/2018) fluorouracil (ADRUCIL) 4,350 mg in sodium chloride 0.9 % 63 mL chemo infusion, 2,400 mg/m2 = 4,350 mg, Intravenous, 1 Day/Dose, 1 of 4 cycles Administration: 4,350 mg (02/02/2018)  for chemotherapy treatment.      Interim History:  Jacob [name] presents following recent MRI brain.  Tolerating new FOLFOX chemo regimen well aside from recent illness/fever last week which delayed most recent cycle.  Had successfuly laryngoplasty last week and has been on voice rest.  Still working part time at [employer].  Medications: Current Outpatient Medications on File Prior to Visit  Medication Sig Dispense Refill  . acetaminophen (TYLENOL) 500 MG tablet Take 500-1,000 mg by mouth daily as needed for moderate pain.    . clindamycin (CLEOCIN) 300 MG capsule Take 1 capsule (300 mg total) by mouth every 8 (eight) hours for 7 days. 21 capsule 0  . docusate sodium (STOOL SOFTENER) 100 MG capsule Take 100 mg by mouth 2 (two) times daily as needed (constipation).     Marland Kitchen dronabinol (MARINOL) 5 MG capsule Take 1 capsule (5 mg total) by mouth 3 (three) times daily as needed. (Patient taking differently: Take 5 mg by mouth 3 (three) times daily as needed (appetite). ) 90 capsule 3  . lamoTRIgine (LAMICTAL) 100 MG tablet Take 1 tablet (100 mg total) by mouth 2 (two) times daily. 60 tablet 3  . lidocaine-prilocaine (EMLA) cream APPLY CREAM TOPICALLY TO PORT SITE 1 HOUR PRIOR TO USE AS NEEDED 30 g 3  . lidocaine-prilocaine (EMLA) cream Apply 1 application topically as needed (prior to port access). 30 g 1  . Multiple Vitamin (MULTIVITAMIN WITH MINERALS) TABS tablet Take 1 tablet by mouth 3 (three) times a week.    . prochlorperazine (COMPAZINE) 10 MG tablet Take 10 mg by mouth every 6 (six) hours as needed for nausea or vomiting.    Marland Kitchen  tiZANidine (ZANAFLEX) 4 MG tablet Take 0.5-1 tablets (2-4 mg total) by mouth every 8 (eight) hours as needed for muscle spasms. 30 tablet 1   No current facility-administered medications on file prior to visit.     Allergies:  Allergies  Allergen Reactions  . Alprazolam Other (See Comments)    Excessive sedation  . Penicillins Rash    Childhood allergy Has patient had a PCN reaction causing immediate rash, facial/tongue/throat swelling, SOB or lightheadedness with hypotension: Unknown Has patient had a PCN reaction causing severe rash involving mucus membranes or skin necrosis: Unknown Has patient had a PCN reaction that required hospitalization: No Has patient had a PCN reaction occurring within the last 10 years: No If all of the above answers are "NO", then may proceed with Cephalosporin use.    Past Medical History:  Past Medical History:  Diagnosis Date  . Allergic state 01/19/2012  . Anemia   . 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 Chicken pox as a child   ?  Marland Kitchen Dysrhythmia as child   HX OF IRREGULAR HB AT TIME OF TONSILLECTOMY - SURGERY WAS NOT DONE-CAN'T REMEMBER ANY OTHER DETAILS- NEVER HAD THE SURGERY.  . Elevated liver function  tests 01/19/2012  . Fatty liver   . Hernia 6 months old  . History of diarrhea    better since chemo finished  . Hx of migraines   . Lung abnormality 2015  . Lung nodule 08/2013  . Lung nodule, multiple 11/29/2013  . Migraine headache as a child  . Numbness    TOES OF BOTH FEET.  Marland Kitchen Overweight(278.02) 01/19/2012  . Pain    LEFT HEEL PAIN -SEVERE ESPECIALLY AFTER SITTING OR LYING DOWN - DIFFICULT TO GET OUT OF BED AND WALK IN THE MORNINGS BECAUSE OF HEEL PAIN  . 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 Preventative health care 01/19/2012  . Radiation 03/20/12-04/27/12   Pelvis 50.4 gray Rectal cancer  . Rectal cancer ([place]) 03/06/12    biopsy-adenocarcinoma  . Reflux 01/19/2012  . Sun-damaged skin 01/19/2012   Past Surgical History:  Past Surgical History:  Procedure Laterality Date  . CHOLECYSTECTOMY N/A 09/17/2013   Procedure: CHOLECYSTECTOMY;  Surgeon: Stark Klein, MD;  Location: [facility];  Service: General;  Laterality: N/A;  . COLOSTOMY TAKEDOWN N/A 08/17/2016   Procedure: LAPAROSCOPIC ABDOMINOPERINEAL RESECTION WITH PERMANENT COLOSTOMY;  Surgeon: Leighton Ruff, MD;  Location: [facility];  Service: General;  Laterality: N/A;  . CYST EXCISION     L EAR AREA  . EUS  03/14/2012   Procedure: LOWER ENDOSCOPIC ULTRASOUND (EUS);  Surgeon: Milus Banister, MD;  Location: [facility];  Service: Endoscopy;  Laterality: N/A;  . HERNIA REPAIR  6 months old   right inguinal repair  . ILEOSTOMY CLOSURE N/A 12/07/2012   Procedure: ILEOSTOMY TAKEDOWN;  Surgeon: Leighton Ruff, MD;  Location: [facility];  Service: General;  Laterality: N/A;  . LAPAROSCOPIC LOW ANTERIOR RESECTION N/A 06/29/2012   Procedure: LAPAROSCOPIC LOW ANTERIOR RESECTION, mobilization splenic flexure,coloanal anastomosis,diverting ileostomy;  Surgeon: Leighton Ruff, MD;  Location: [facility];  Service: General;  Laterality: N/A;  . LAPAROSCOPY N/A 09/17/2013   Procedure: LAPAROSCOPY DIAGNOSTIC;  Surgeon: Stark Klein, MD;  Location: [facility];  Service: General;  Laterality: N/A;  . LARYNGOPLASTY Left 02/10/2018   Procedure: LARYNGOPLASTY;  Surgeon: Melissa Montane, MD;  Location: [facility];  Service: ENT;  Laterality: Left;  . LIVER ULTRASOUND N/A 09/17/2013   Procedure: LIVER ULTRASOUND;  Surgeon: Stark Klein, MD;  Location: [facility];  Service: General;  Laterality: N/A;  . MICROLARYNGOSCOPY W/VOCAL CORD INJECTION Left 05/16/2017   Procedure: MICROLARYNGOSCOPY WITH VOCAL CORD INJECTION;  Surgeon: Melissa Montane, MD;  Location: [facility];  Service: ENT;  Laterality: Left;  . OPEN HEPATECTOMY  N/A 09/17/2013   Procedure: OPEN HEPATECTOMY;  Surgeon: Stark Klein, MD;  Location: [facility];  Service: General;  Laterality: N/A;  . PORTACATH PLACEMENT Left 03/21/2013   Procedure: INSERTION PORT-A-CATH;  Surgeon: Leighton Ruff, MD;  Location: [facility];  Service: General;  Laterality: Left;  . RECTAL BIOPSY  03/06/12   distal mass1-2cm from anal verge=Adenocarcinoma  . TONSILLECTOMY     Social History:  Social History   Socioeconomic History  . Marital status: Married    Spouse name: Not on file  . Number of children: 1  . Years of education: Not on file  . Highest education level: Not on file  Occupational History  . Occupation: Midwife: [employer]    Comment: [employer]  Social Needs  . Financial resource strain: Not on file  . Food insecurity:    Worry: Not on file    Inability:  Not on file  . Transportation needs:    Medical: Not on file    Non-medical: Not on file  Tobacco Use  . Smoking status: Never Smoker  . Smokeless tobacco: Never Used  Substance and Sexual Activity  . Alcohol use: Not Currently    Alcohol/week: 0.0 standard drinks    Comment: RARE   . Drug use: No    Comment: marijuana past  . Sexual activity: Yes    Partners: Female  Lifestyle  . Physical activity:    Days per week: Not on file    Minutes per session: Not on file  . Stress: Not on file  Relationships  . Social connections:    Talks on phone: Not on file    Gets together: Not on file    Attends religious service: Not on file    Active member of club or organization: Not on file    Attends meetings of clubs or organizations: Not on file    Relationship status: Not on file  . Intimate partner violence:    Fear of current or ex partner: Not on file    Emotionally abused: Not on file    Physically abused: Not on file    Forced sexual activity: Not on file  Other Topics Concern  . Not on file  Social History Narrative   Married to wife, [name]   Has one 63 year old child   Occupation: Emergency planning/management officer at [employer]   Family History:  Family History    Adopted: Yes  Problem Relation Age of Onset  . Cancer Father   . Heart disease Father   . Colon cancer Neg Hx     Review of Systems: Constitutional: Denies fevers, chills or abnormal weight loss Eyes: Denies blurriness of vision Ears, nose, mouth, throat, and face: Denies mucositis or sore throat Respiratory: Denies cough, dyspnea or wheezes Cardiovascular: Denies palpitation, chest discomfort or lower extremity swelling Gastrointestinal:  Colostomy GU: Denies dysuria or incontinence Skin: Denies abnormal skin rashes Neurological: +headaches Musculoskeletal: +joint pain, No decrease in ROM Behavioral/Psych: mood instability   Physical Exam: Vitals:   02/17/18 1157  BP: 118/84  Pulse: 97  Resp: 18  Temp: 98.7 F (37.1 C)  SpO2: 100%   KPS: 90. General: Alert, cooperative, pleasant, in no acute distress Head: Normal EENT: No conjunctival injection or scleral icterus. Oral mucosa moist Lungs: Resp effort normal Cardiac: Regular rate and rhythm Abdomen: Colostomy C/D/I Skin: No rashes cyanosis or petechiae. Extremities: No clubbing or edema  Neurologic Exam: Mental Status: Awake, alert, attentive to examiner. Language is fluent with intact comprehension.  Cranial Nerves: Visual acuity is grossly normal. Visual fields are full. Extra-ocular movements intact. No ptosis. Face is symmetric, tongue midline. Motor: Tone and bulk are normal. Power is full in both arms and legs. Reflexes are symmetric, no pathologic reflexes present. Intact finger to nose bilaterally Sensory: Intact to light touch and temperature Gait: Normal and tandem gait is normal.   Labs: I have reviewed the data as listed    Component Value Date/Time   NA 141 02/02/2018 1102   NA 142 03/24/2017 1009   K 3.8 02/02/2018 1102   K 4.0 03/24/2017 1009   CL 106 02/02/2018 1102   CL 108 (H) 08/30/2012 0903   CO2 27 02/02/2018 1102   CO2 27 03/24/2017 1009   GLUCOSE 88 02/02/2018 1102   GLUCOSE 96  03/24/2017 1009   GLUCOSE 96 08/30/2012 0903   BUN 16 02/02/2018 1102  BUN 12.2 03/24/2017 1009   CREATININE 0.84 02/02/2018 1102   CREATININE 0.7 03/24/2017 1009   CALCIUM 9.0 02/02/2018 1102   CALCIUM 8.8 03/24/2017 1009   PROT 6.2 (L) 02/02/2018 1102   PROT 6.1 (L) 03/24/2017 1009   ALBUMIN 3.4 (L) 02/02/2018 1102   ALBUMIN 3.6 03/24/2017 1009   AST 20 02/02/2018 1102   AST 19 03/24/2017 1009   ALT 18 02/02/2018 1102   ALT 14 03/24/2017 1009   ALKPHOS 104 02/02/2018 1102   ALKPHOS 67 03/24/2017 1009   BILITOT 0.6 02/02/2018 1102   BILITOT <0.22 03/24/2017 1009   GFRNONAA >60 02/02/2018 1102   GFRAA >60 02/02/2018 1102   Lab Results  Component Value Date   WBC 3.9 (L) 02/10/2018   NEUTROABS 2.9 02/10/2018   HGB 10.0 (L) 02/10/2018   HCT 32.7 (L) 02/10/2018   MCV 87.0 02/10/2018   PLT 134 (L) 02/10/2018   Imaging:  [facility] Clinician Interpretation: I have personally reviewed the CNS images as listed.  My interpretation, in the context of the patient's clinical presentation, is progressive disease  Mr Jeri Cos Wo Contrast  Result Date: 02/13/2018 CLINICAL DATA:  Metastatic colon cancer. Cranial radiation and chemotherapy treatment. Follow-up brain metastasis. EXAM: MRI HEAD WITHOUT AND WITH CONTRAST TECHNIQUE: Multiplanar, multiecho pulse sequences of the brain and surrounding structures were obtained without and with intravenous contrast. CONTRAST:  60mL MULTIHANCE GADOBENATE DIMEGLUMINE 529 MG/ML IV SOLN COMPARISON:  MRI head 11/14/2017, 08/03/2017 FINDINGS: BRAIN New Lesions: 1 6.6 mm enhancing lesion located left frontal parietal white matter with mild surrounding edema and mild hemorrhage, and no mass effect seen on 106/13. Larger lesions: 3 Left medial parietal lesion measures 6.1 mm, compared with 4.2 mm. Increased edema and new hemorrhage since the prior study. Moderate surrounding edema. Image 96/13 Left occipital irregular enhancing lesion measures 10 x 10 mm, compared  with 7 mm previously. Moderate edema has increased. Progressive tumor hemorrhage. Image 68/13 Left occipital peripheral lesion slightly larger now measuring 11.3 mm, compared with 10.6 mm. Moderate surrounding edema has progressed. Mild hemorrhage unchanged. Image 52/13 Stable or Smaller lesions: 1 2 mm enhancing lesion located right frontal and seen on 101/13. Other Brain findings: Ventricle size normal. No midline shift. Moderate edema left occipital parietal lobe with local mass-effect. Negative for acute infarct. Vascular: Normal arterial flow voids. Skull and upper cervical spine: Negative Sinuses/Orbits: Negative Other: None IMPRESSION: 1. New left frontal parietal white matter lesion 6.6 mm with hemorrhage and edema, likely new metastatic disease 2. Mild progression of enhancing lesion size and surrounding edema and hemorrhage in the left occipital parietal lobe. These are previously treated lesions. Changes may be related to treatment. Continued close follow-up warranted 3. 2 mm right frontal lesion has been treated and continues to decrease in size. Electronically Signed   By: Franchot Gallo M.D.   On: 02/13/2018 09:46     Assessment/Plan 1. Brain metastasis ([place])  2. Focal seizures [facility])  Mr. Holte is clinically stable but has new metastatic focus in the left frontal white matter on MRI.  We recommended and discussed rationale for SRS to this new lesion.  Communication of this recommendation was made with radiation oncology department and Dr. Lisbeth Renshaw.  Other recently treated lesions are suspicious for inflammatory response to radiosurgery.  We did not recommend any steroids today.  Will continue to follow.  He should continue Lamictal to 100mg  BID for seizures.  We appreciate the opportunity to participate in the care of Jacob [name].  He should return to clinic in 3 months following his next scheduled brain MRI after radiation.  All questions were answered. The patient knows to call the  clinic with any problems, questions or concerns. No barriers to learning were detected.  The total time spent in the encounter was 25 minutes and more than 50% was on counseling and review of test results   Ventura Sellers, MD Medical Director of Neuro-Oncology [facility] [place] 02/17/18 11:46 AM

## 2018-02-20 ENCOUNTER — Telehealth: Payer: Self-pay | Admitting: *Deleted

## 2018-02-20 ENCOUNTER — Ambulatory Visit: Payer: BC Managed Care – PPO | Admitting: Family Medicine

## 2018-02-20 ENCOUNTER — Telehealth: Payer: Self-pay

## 2018-02-20 ENCOUNTER — Other Ambulatory Visit: Payer: BC Managed Care – PPO

## 2018-02-20 NOTE — Progress Notes (Signed)
Has armband been applied?  Yes  Does patient have an allergy to IV contrast dye?: No   Has patient ever received premedication for IV contrast dye?: N/A  Does patient take metformin?: No  If patient does take metformin when was the last dose: N/A  Date of lab work: 02/02/2018 BUN: 16 CR: 0.84 EGfr: >60  IV site: Left Chest Port  Has IV site been added to flowsheet?  Yes  BP 132/90 (BP Location: Left Arm, Patient Position: Sitting)   Pulse (!) 109   Temp 98.6 F (37 C) (Oral)   Resp 18   Wt 149 lb 9.6 oz (67.9 kg)   SpO2 100%   BMI 21.47 kg/m

## 2018-02-20 NOTE — Telephone Encounter (Signed)
Patient called to follow up on his schedule this week. Said he can't come today. Informed him the appointment today is not for a visit, just discussion in brain tumor board. He will still try to come in this week for Lab/OV and then have tx on 12/6. Explained he can have the pump taken off early on Saturday or extend the infusion to come off on Monday. He reports he is still having nausea and vomits every day. Tells RN he does not know how much he drinks/day but feels it is adequate. Bowels are moving daily now and still has intermittent abdominal pain. No distention or firmness.

## 2018-02-20 NOTE — Telephone Encounter (Signed)
Per 12/2 no los

## 2018-02-21 ENCOUNTER — Ambulatory Visit
Admission: RE | Admit: 2018-02-21 | Discharge: 2018-02-21 | Disposition: A | Discharge status: Home / Self Care | Payer: BC Managed Care – PPO | Source: Ambulatory Visit | Attending: Radiation Oncology | Admitting: Radiation Oncology

## 2018-02-21 DIAGNOSIS — C7931 Secondary malignant neoplasm of brain: Secondary | ICD-10-CM | POA: Diagnosis present

## 2018-02-21 DIAGNOSIS — Z510 Encounter for antineoplastic radiation therapy: Secondary | ICD-10-CM | POA: Insufficient documentation

## 2018-02-21 NOTE — Progress Notes (Signed)
Brain and Spine Tumor Board Documentation  SANDY BLOUCH was presented by Cecil Cobbs, MD at Brain and Spine Tumor Board on 02/21/2018, which included representatives from neuro oncology, radiation oncology, surgical oncology, navigation, pathology, radiology.  Marton was presented as a current patient with history of the following treatments:  .  Additionally, we reviewed previous medical and familial history, history of present illness, and recent lab results along with all available histopathologic and imaging studies. The tumor board considered available treatment options and made the following recommendations:  Radiation therapy (primary modality) SRS to new left frontal lesion  Tumor board is a meeting of clinicians from various specialty areas who evaluate and discuss patients for whom a multidisciplinary approach is being considered. Final determinations in the plan of care are those of the provider(s). The responsibility for follow up of recommendations given during tumor board is that of the provider.   Today's extended care, comprehensive team conference, Michaelanthony was not present for the discussion and was not examined.

## 2018-02-22 ENCOUNTER — Inpatient Hospital Stay (HOSPITAL_BASED_OUTPATIENT_CLINIC_OR_DEPARTMENT_OTHER): Payer: BC Managed Care – PPO | Admitting: Oncology

## 2018-02-22 ENCOUNTER — Inpatient Hospital Stay: Payer: BC Managed Care – PPO | Attending: Oncology

## 2018-02-22 ENCOUNTER — Encounter: Payer: Self-pay | Admitting: General Practice

## 2018-02-22 ENCOUNTER — Inpatient Hospital Stay: Payer: BC Managed Care – PPO

## 2018-02-22 VITALS — BP 147/76 | HR 91 | Temp 98.5°F | Resp 18 | Ht 70.0 in | Wt 151.3 lb

## 2018-02-22 DIAGNOSIS — C787 Secondary malignant neoplasm of liver and intrahepatic bile duct: Secondary | ICD-10-CM

## 2018-02-22 DIAGNOSIS — R042 Hemoptysis: Secondary | ICD-10-CM

## 2018-02-22 DIAGNOSIS — R0609 Other forms of dyspnea: Secondary | ICD-10-CM | POA: Insufficient documentation

## 2018-02-22 DIAGNOSIS — G939 Disorder of brain, unspecified: Secondary | ICD-10-CM

## 2018-02-22 DIAGNOSIS — D709 Neutropenia, unspecified: Secondary | ICD-10-CM

## 2018-02-22 DIAGNOSIS — Z5111 Encounter for antineoplastic chemotherapy: Secondary | ICD-10-CM | POA: Insufficient documentation

## 2018-02-22 DIAGNOSIS — D696 Thrombocytopenia, unspecified: Secondary | ICD-10-CM | POA: Diagnosis not present

## 2018-02-22 DIAGNOSIS — C20 Malignant neoplasm of rectum: Secondary | ICD-10-CM | POA: Diagnosis not present

## 2018-02-22 DIAGNOSIS — C7931 Secondary malignant neoplasm of brain: Secondary | ICD-10-CM | POA: Insufficient documentation

## 2018-02-22 DIAGNOSIS — Z5189 Encounter for other specified aftercare: Secondary | ICD-10-CM | POA: Insufficient documentation

## 2018-02-22 DIAGNOSIS — R062 Wheezing: Secondary | ICD-10-CM | POA: Diagnosis not present

## 2018-02-22 DIAGNOSIS — Z95828 Presence of other vascular implants and grafts: Secondary | ICD-10-CM | POA: Insufficient documentation

## 2018-02-22 DIAGNOSIS — D6959 Other secondary thrombocytopenia: Secondary | ICD-10-CM

## 2018-02-22 LAB — CBC WITH DIFFERENTIAL (CANCER CENTER ONLY)
Abs Immature Granulocytes: 0.01 10*3/uL (ref 0.00–0.07)
BASOS ABS: 0 10*3/uL (ref 0.0–0.1)
BASOS PCT: 1 %
EOS PCT: 3 %
Eosinophils Absolute: 0.1 10*3/uL (ref 0.0–0.5)
HCT: 28.2 % — ABNORMAL LOW (ref 39.0–52.0)
Hemoglobin: 8.7 g/dL — ABNORMAL LOW (ref 13.0–17.0)
Immature Granulocytes: 1 %
LYMPHS ABS: 0.2 10*3/uL — AB (ref 0.7–4.0)
Lymphocytes Relative: 12 %
MCH: 26.6 pg (ref 26.0–34.0)
MCHC: 30.9 g/dL (ref 30.0–36.0)
MCV: 86.2 fL (ref 80.0–100.0)
Monocytes Absolute: 0.3 10*3/uL (ref 0.1–1.0)
Monocytes Relative: 14 %
NEUTROS PCT: 69 %
NRBC: 0 % (ref 0.0–0.2)
Neutro Abs: 1.3 10*3/uL — ABNORMAL LOW (ref 1.7–7.7)
Platelet Count: 85 10*3/uL — ABNORMAL LOW (ref 150–400)
RBC: 3.27 MIL/uL — AB (ref 4.22–5.81)
RDW: 17.9 % — AB (ref 11.5–15.5)
WBC: 1.9 10*3/uL — AB (ref 4.0–10.5)

## 2018-02-22 LAB — CEA (IN HOUSE-CHCC): CEA (CHCC-In House): 63.3 ng/mL — ABNORMAL HIGH (ref 0.00–5.00)

## 2018-02-22 LAB — CMP (CANCER CENTER ONLY)
ALT: 10 U/L (ref 0–44)
ANION GAP: 8 (ref 5–15)
AST: 17 U/L (ref 15–41)
Albumin: 3.5 g/dL (ref 3.5–5.0)
Alkaline Phosphatase: 118 U/L (ref 38–126)
BUN: 10 mg/dL (ref 6–20)
CALCIUM: 9 mg/dL (ref 8.9–10.3)
CO2: 25 mmol/L (ref 22–32)
Chloride: 105 mmol/L (ref 98–111)
Creatinine: 0.95 mg/dL (ref 0.61–1.24)
Glucose, Bld: 104 mg/dL — ABNORMAL HIGH (ref 70–99)
Potassium: 4.1 mmol/L (ref 3.5–5.1)
SODIUM: 138 mmol/L (ref 135–145)
TOTAL PROTEIN: 6.4 g/dL — AB (ref 6.5–8.1)
Total Bilirubin: 0.4 mg/dL (ref 0.3–1.2)

## 2018-02-22 LAB — MAGNESIUM: MAGNESIUM: 2.2 mg/dL (ref 1.7–2.4)

## 2018-02-22 MED ORDER — SODIUM CHLORIDE 0.9% FLUSH
10.0000 mL | INTRAVENOUS | Status: DC | PRN
  Filled 2018-02-22: qty 10

## 2018-02-22 MED ORDER — IPRATROPIUM-ALBUTEROL 20-100 MCG/ACT IN AERS: 2.0000 | INHALATION_SPRAY | Freq: Four times a day (QID) | RESPIRATORY_TRACT | 0 refills | Status: DC | PRN

## 2018-02-22 NOTE — Progress Notes (Signed)
Went to get Pt to access port and pt said he got his labs drawn peripherally and he was not doing treatment anymore. arived pt to doctors apt.

## 2018-02-22 NOTE — Progress Notes (Signed)
[place] OFFICE PROGRESS NOTE   Diagnosis: Rectal cancer  INTERVAL HISTORY:   Jacob [name] completed a cycle of FOLFOX 02/02/2018.  He reports cold sensitivity for several days following chemotherapy.  This has resolved.  No peripheral neuropathy symptoms at present.  He has multiple complaints today including intermittent small-volume hemoptysis, constipation, stiffness in the neck, and dyspnea.  The dyspnea improves after he coughs up sputum/blood.  He underwent a left laryngoplasty on 02/10/2018.  The hoarseness has improved.  Objective:  Vital signs in last 24 hours:  Blood pressure (!) 147/76, pulse 91, temperature 98.5 F (36.9 C), temperature source Oral, resp. rate 18, height 5' 10" (1.778 m), weight 151 lb 4.8 oz (68.6 kg), SpO2 100 %.    HEENT: No thrush or ulcers, neck without mass Resp: Good air movement bilaterally, bilateral inspiratory and expiratory wheeze, no respiratory distress Cardio: Regular rate and rhythm GI: No hepatomegaly, nontender, left lower quadrant colostomy Vascular: No leg edema    Portacath/PICC-without erythema  Lab Results:  Lab Results  Component Value Date   WBC 1.9 (L) 02/22/2018   HGB 8.7 (L) 02/22/2018   HCT 28.2 (L) 02/22/2018   MCV 86.2 02/22/2018   PLT 85 (L) 02/22/2018   NEUTROABS 1.3 (L) 02/22/2018    CMP  Lab Results  Component Value Date   NA 138 02/22/2018   K 4.1 02/22/2018   CL 105 02/22/2018   CO2 25 02/22/2018   GLUCOSE 104 (H) 02/22/2018   BUN 10 02/22/2018   CREATININE 0.95 02/22/2018   CALCIUM 9.0 02/22/2018   PROT 6.4 (L) 02/22/2018   ALBUMIN 3.5 02/22/2018   AST 17 02/22/2018   ALT 10 02/22/2018   ALKPHOS 118 02/22/2018   BILITOT 0.4 02/22/2018   GFRNONAA >60 02/22/2018   GFRAA >60 02/22/2018    Lab Results  Component Value Date   CEA1 63.30 (H) 02/22/2018     Medications: I have reviewed the patient's current medications.   Assessment/Plan: 1. Rectal cancer. Partially  obstructing mass noted 1-2 cm from the anal verge on a colonoscopy 03/06/2012. Endoscopic ultrasound 03/14/2012 with a 7.5 mm thick, 3.2 cm wide hypoechoic, irregularly bordered mass that clearly passed into and through the muscularis propria layer of the distal rectal wall (uT3); 3 small (largest 7 mm) perirectal lymph nodes. The lymph nodes were all round, discrete, hypoechoic, homogenous; suspicious for malignant involvement (uN1).  No RAS mutation identified by [facility] 1 testing on the colon resection specimen 06/29/2012, APC alteration identified, microsatellite stable  He began radiation and concurrent Xeloda chemotherapy on 03/20/2012, completed 04/27/2012.   Low anterior resection/coloanal anastomosis and diverting ileostomy 06/29/2012 with the final pathology revealing a T2N0 tumor with extensive fibrosis and negative margins.   Cycle 1 of adjuvant CAPOX 07/19/2012. Cycle 5 of adjuvant CAPOX 10/11/2012.   CEA 2.5 on 11/14/2012.   CEA 14.6 03/08/2013.   Restaging CT evaluation 03/08/2013 with a 4 mm pulmonary nodule left lung base not identified on comparison exam; new liver lesions including a 29 x 26 mm irregular peripheral enhancing rounded lesion in the dome of the right hepatic lobe, a less well-defined new subcapsular lesion in the lateral right hepatic lobe measuring 12 mm, a new subcapsular lesion in the anterior right hepatic lobe adjacent to the gallbladder fossa measuring 10 mm; a rounded low-density lesion in the inferior right hepatic lobe measuring 10 mm compared to 7 mm on the prior study (radiologist commented this may represent an enlarged cyst).   MRI  of the abdomen 04/12/2013 confirmed multiple T2 hyperintense metastatic lesions throughout the right liver   Initiation of FOLFIRI/Avastin with genotype based irinotecan dosing per the [facility] study 04/05/2013.   Restaging CT evaluation on 06/20/2013 (after 2 cycles/4 treatments) showed improvement in the liver  metastases and stable size of a left lower lobe pulmonary nodule now with central cavitation.   Continuation of FOLFIRI/Avastin.   Restaging CT evaluation 08/14/2013-decrease in the size of liver metastases, slight decrease in the size of a cavitary left lower lobe nodule, no evidence of disease progression.   Status post right hepatic lobectomy 09/17/2013. Pathology showed multiple foci of metastatic adenocarcinoma (5 nodules of metastatic adenocarcinoma 4 of which are subcapsular with the nodules ranging in size from 0.6-1.8 cm in greatest dimension). Margins not involved. Biopsy of a portal lymph node showed benign adipose tissue; no lymph node tissue or malignancy.   Normal CEA 11/06/2013   CT 11/06/2013 with a new pleural-based right lower chest lesion, slight in enlargement of the a left-sided lung nodule  CT 01/29/2014 with a decrease in the right lower chest pleural-based lesion and a slight increase of a left-sided lung lesion, other lung lesions were stable  CT chest 05/02/2014 with a decrease in the size of a right lower lobe pulmonary nodule months similar size of a dominant left lower lobe nodule, minimal enlargement of a smaller left lower lobe nodule, no new site of disease  CT chest 11/07/2014 with a slight increase in several left-sided lung nodules  CT chest 02/10/2015 with new small left hilar lymph nodes, a possible new left lower lobe nodule, and stability of other lung nodules  PET scan 03/12/2015 with hypermetabolic left hilar nodes, hypermetabolic left lower lobe nodules, hyper metabolic retroperitoneal nodes, intense hypermetabolism at the coloanal anastomosis, and hypermetabolic thickening in the presacral space  Cycle 1 FOLFIRI/PANITUMUMAB 05/21/2015  Cycle 2 FOLFIRI/PANITUMUMAB 06/05/2015  Cycle 3 FOLFIRI/PANITUMUMAB 06/19/2015  Cycle 4 FOLFIRI 07/10/2015  Cycle 5 FOLFIRI 07/24/2015  Restaging CTs 08/06/2015-resolution of hilar/retroperitoneal  adenopathy, improvement in the hypermetabolic lung nodule, other lung nodules are stable, no new lesions  Cycle 6 FOLFIRI with PANITUMUMAB 08/28/2015 -panitumumab dose reduced  Cycle 7 FOLFIRI 09/11/2015-no panitumumab given  Cycle 8 FOLFIRI with PANITUMUMAB 09/25/2015-PANITUMUMAB dose reduced  Cycle 9 FOLFIRI 10/09/2015-no PANITUMUMAB given  Cycle 10 FOLFIRI with panitumumab 10/23/2015  CTs 11/06/2015-possible slight enlargement of a left lower lobe nodule, no other evidence of disease progression.  CTs 02/16/2016-enlargement of left-sided pulmonary nodules and mediastinal/left hilar nodes, improved splenomegaly  CTs 06/21/2016-increased size of mediastinal/left hilar nodes, increased left lung nodules, and increased soft tissue at the porta hepatis  CTs 10/28/2016-progressive disease in the chest and abdomen with an enlarging left hilar mass and slight interval enlargement of pulmonary lesions. Right upper quadrant necrotic nodal mass significantly increased in size with mass effect on the liver and possible invasion. Significant compression of the intrahepatic IVC. New right hepatic lobe lesion. Moderate pelvic ascites.  Cycle 1 FOLFIRI/PANITUMUMAB 11/04/2016  Cycle 2 FOLFIRI/PANITUMUMAB 11/18/2016  Cycle 3 FOLFIRI/panitumumab 12/02/2016  Cycle4 FOLFIRI/PANITUMUMAB 12/15/2016  Cycle 5 FOLFIRI/PANITUMUMAB 12/30/2016  CTs 01/06/2017-no evidence of progressive disease, decreased chest adenopathy, stable left lower lobe nodules, decreased liver metastasis, decreased right retroperitoneal mass  Cycle 6 FOLFIRI/panitumumab 01/13/2017  Cycle 7 FOLFIRI/Panitumumab 01/27/2017  Cycle 8 FOLFIRI/panitumumab 02/17/2017  Cycle 9 FOLFIRI/Panitumumab 03/03/2017  Cycle 10 FOLFIRI/Panitumumab 03/24/2017  CTs 04/06/2017-enlargement of right adrenal mass (smaller than October 2019), stable left hilar fullness and lung nodules  Cycle 11 FOLFIRI/Panitumumab 04/07/2017  Cycle 12  FOLFIRI/panitumumab 04/21/2017  Cycle 13 FOLFIRI/Panitumumab 05/05/2017  Palliative radiation to the right adrenal mass 226 2019-3 02/2018  Brain MRI 07/28/2017-4intracranial metastasis in the right frontal lobe, left parietal lobe and left occipital lobe. Surrounding edema pronounced in the left occipital lobe.  SBRT to for brain lesions on 08/12/2017  CTs 08/10/2017- new left upper lobe airspace process, stable lung nodules and medius and lymphadenopathy, decreased mass involving the adrenal gland with progression of a necrotic anterior portion of the mass, no liver lesions  Cycle 14 FOLFIRI/panitumumab 08/19/2017, irinotecan dose reduced secondary to thrombocytopenia, Panitumumab dose escalated  Cycle 15 FOLFIRI/panitumumab 09/01/2017) Panitumumab dose reduced secondary to an early rash following cycle 14  CT chest 10/24/2017- progression of disease in the left lung; mild progression of left hilar nodal disease with new right hilar lymphadenopathy and progressive increase in lymph nodes in the mediastinum; interval progression of caudate lobe metastatic lesion.  Cycle 1 FOLFIRI/Panitumumab 11/03/2017  Cycle 2 FOLFIRI/Panitumumab 11/17/2017  Cycle 3 FOLFIRI/panitumumab 12/08/2017 (Panitumumab dose increased)  Cycle 4 FOLFIRI/Panitumumab 12/22/2017  Cycle 5 FOLFIRI/Panitumumab 01/05/2018  CTs 01/17/2018- new 4 cm posterior liver mass. Mild growth of separate heterogeneous 5.9 cm mass posterior margin of the liver near the IVC. Newbandlike low-attenuation focus in the posterior inferior liver, indeterminate, favoring expansile right portal vein thrombus. Stable right adrenal metastasis. Mixed response in the chest. Left hilar/AP window adenopathy mildly increased. Right paratracheal adenopathy mildly increased. Right hilar adenopathy decreased. Left lower lobe pulmonary nodule stable to mildly decreased. Tiny right upper lobe pulmonary nodule slightly increased. New 2.7 cm sub-solid  pulmonary nodule basilar right lower lobe. Waxing and waning left upper lobe nodular foci of consolidation. New mild splenomegaly.  Cycle 1 FOLFOX 02/02/2018  Brain MRI 02/13/2018-new left frontal parietal lesion, mild progression of enhancing lesion in the left occipital lobe-previously treated lesion  Cycle 2 FOLFOX 02/24/2018 2. History ofIrregular bowel habits/rectal bleeding secondary to #1. 3. History of Mild elevation of the liver enzymes. Question secondary to hepatic steatosis. 4. Indeterminate 8 mm posterior right liver lesion on the staging CT 03/06/2012. 5. Mildly elevated CEA at 5.7 on 03/06/2012. 6. History of radiation erythema at the groin and perineum 7. 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 No clinical evidence of thrombophlebitis or extravasation. 8. Delayed nausea following chemotherapy-Decadron prophylaxis was added with cycle 3 CAPOX-improved. 9. Ileostomy takedown 12/07/2012. 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 11. Port-A-Cath placement 12/31/204 12. Severe neutropenia secondary to chemotherapy following cycle 1 of FOLFIRIin 2015, chemotherapy was dose reduced and he received Neulasta with day 15 cycle 1  13. Nausea and vomiting following cycle 1 of FOLFIRI 14. Rectal stricture-manual/colonoscopic dilatation by Dr. Jacobs 03/01/2016  APR 08/17/2016-no evidence of malignancy 15. History ofLeukopenia/Thrombocytopenia-persistent, potentially a sequelae of chemotherapy or hepatic toxicity from chemotherapy/radiation. Bone marrow biopsy 11/20/2014 showed cellular bone marrow with trilineage hematopoiesis. Significant dyspoiesis was not present and there was no evidence of metastatic carcinoma. Cytogenetic analysis showed the presence of normal male chromosomes with no observable clonal chromosomal abnormalities.  probable cirrhosis 16. Genetic testing-negative genetic panel  in March 2014 17. 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 Severe over the face-steroid Dosepak prescribed 07/03/2015. Improved 07/10/2015. Further improved 07/24/2015, 08/07/2015, 08/28/2015 18. Diarrhea secondary to chemotherapy-encouraged to use Imodium  19. History of Paronychia secondary to PANITUMUMAB 20. 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 History of neutropenia,   thrombocytopenia 06/23/2017. Possibly related to radiation. He also has a history of suspected portal hypertension secondaryto toxicity from chemotherap 22. Seizure in the setting of brain metastases 08/05/2017, now maintained onLamictal 23. Altered mental status evaluated by neuro-oncology, Keppra discontinued, improved    Disposition: Jacob [name] appears unchanged.  He completed 1 cycle of salvage FOLFOX chemotherapy.  He will return for cycle 2 on 02/24/2018.  He has mild neutropenia and thrombocytopenia today.  The CBC will be repeated on 02/24/2018.  He will receive Neulasta with chemotherapy.  Jacob [name] has been evaluated by neurology and is scheduled for SBRT to the new brain lesion next week.  He continues to have hemoptysis, likely related to the metastatic disease in the chest.  He will contact us for increased hemoptysis.  He will try a Combivent inhaler for the wheezing and dyspnea.  Jacob [name] will return for an office visit and the next cycle of chemotherapy on 03/09/2018.  30 minutes were spent with the patient today.  The majority of the time was used for counseling and coordination of care.   , MD  02/22/2018  2:24 PM   

## 2018-02-22 NOTE — Progress Notes (Signed)
[place] Psychosocial Distress Screening Clinical Social Work  Clinical Social Work was referred by distress screening protocol.  The patient scored a 6 on the Psychosocial Distress Thermometer which indicates moderate distress. Clinical Social Worker contacted patient by phone to assess for distress and other psychosocial needs. Distress Screen not available, left VM asking patient to return call if needs resources/support from [place].  Will await return call.      Clinical Social Worker follow up needed: No.  Patient will return call if desired.   If yes, follow up plan:  Beverely Pace, [place], LCSW Clinical Social Worker Phone:  [phone]

## 2018-02-23 ENCOUNTER — Other Ambulatory Visit: Payer: Self-pay | Admitting: *Deleted

## 2018-02-23 ENCOUNTER — Encounter: Payer: Self-pay | Admitting: *Deleted

## 2018-02-23 ENCOUNTER — Telehealth: Payer: Self-pay | Admitting: Oncology

## 2018-02-23 MED ORDER — IPRATROPIUM-ALBUTEROL 20-100 MCG/ACT IN AERS: 1.0000 | INHALATION_SPRAY | Freq: Four times a day (QID) | RESPIRATORY_TRACT | 0 refills | Status: DC | PRN

## 2018-02-23 NOTE — Progress Notes (Signed)
  Radiation Oncology         [phone] ________________________________  Name: Jacob [name] MRN: [id number]  Date: 02/21/2018  DOB: 1970/09/18  DIAGNOSIS:     ICD-10-CM   1. Brain metastasis ([place]) C79.31     NARRATIVE:  The patient was brought to the [place].  Identity was confirmed.  All relevant records and images related to the planned course of therapy were reviewed.  The patient freely provided informed written consent to proceed with treatment after reviewing the details related to the planned course of therapy. The consent form was witnessed and verified by the simulation staff. Intravenous access was established for contrast administration. Then, the patient was set-up in a stable reproducible supine position for radiation therapy.  A relocatable thermoplastic stereotactic head frame was fabricated for precise immobilization.  CT images were obtained.  Surface markings were placed.  The CT images were loaded into the planning software and fused with the patient's targeting MRI scan.  Then the target and avoidance structures were contoured.  Treatment planning then occurred.  The radiation prescription was entered and confirmed.  I have requested 3D planning  I have requested a DVH of the following structures: Brain stem, brain, left eye, right eye, lenses, optic chiasm, target volumes, uninvolved brain, and normal tissue.    SPECIAL TREATMENT PROCEDURE:  The planned course of therapy using radiation constitutes a special treatment procedure. Special care is required in the management of this patient for the following reasons. This treatment constitutes a Special Treatment Procedure for the following reason: High dose per fraction requiring special monitoring for increased toxicities of treatment including daily imaging.  The special nature of the planned course of radiotherapy will require increased physician supervision and oversight to ensure patient's safety with  optimal treatment outcomes.  PLAN:  The patient will receive 20 Gy in 1 fraction.   ------------------------------------------------  Jodelle Gross, MD, PhD

## 2018-02-23 NOTE — Telephone Encounter (Signed)
Scheduled apt per 12/4 los - left message for patient with appt  Date and time

## 2018-02-23 NOTE — Progress Notes (Signed)
Received notification from Carolynn Sayers at [facility] that they will be able to d/c his infusion pump on Sunday.

## 2018-02-24 ENCOUNTER — Inpatient Hospital Stay: Payer: BC Managed Care – PPO

## 2018-02-24 ENCOUNTER — Other Ambulatory Visit: Payer: Self-pay | Admitting: Oncology

## 2018-02-24 VITALS — BP 122/80 | HR 83 | Temp 97.8°F | Resp 14

## 2018-02-24 DIAGNOSIS — Z5111 Encounter for antineoplastic chemotherapy: Secondary | ICD-10-CM | POA: Diagnosis not present

## 2018-02-24 DIAGNOSIS — C787 Secondary malignant neoplasm of liver and intrahepatic bile duct: Principal | ICD-10-CM

## 2018-02-24 DIAGNOSIS — C20 Malignant neoplasm of rectum: Secondary | ICD-10-CM

## 2018-02-24 LAB — CBC WITH DIFFERENTIAL (CANCER CENTER ONLY)
Abs Immature Granulocytes: 0 10*3/uL (ref 0.00–0.07)
Basophils Absolute: 0 10*3/uL (ref 0.0–0.1)
Basophils Relative: 1 %
Eosinophils Absolute: 0.1 10*3/uL (ref 0.0–0.5)
Eosinophils Relative: 3 %
HCT: 27.4 % — ABNORMAL LOW (ref 39.0–52.0)
Hemoglobin: 8.4 g/dL — ABNORMAL LOW (ref 13.0–17.0)
Immature Granulocytes: 0 %
Lymphocytes Relative: 12 %
Lymphs Abs: 0.2 10*3/uL — ABNORMAL LOW (ref 0.7–4.0)
MCH: 26.1 pg (ref 26.0–34.0)
MCHC: 30.7 g/dL (ref 30.0–36.0)
MCV: 85.1 fL (ref 80.0–100.0)
Monocytes Absolute: 0.3 10*3/uL (ref 0.1–1.0)
Monocytes Relative: 18 %
Neutro Abs: 1.2 10*3/uL — ABNORMAL LOW (ref 1.7–7.7)
Neutrophils Relative %: 66 %
Platelet Count: 82 10*3/uL — ABNORMAL LOW (ref 150–400)
RBC: 3.22 MIL/uL — ABNORMAL LOW (ref 4.22–5.81)
RDW: 17.9 % — ABNORMAL HIGH (ref 11.5–15.5)
WBC Count: 1.8 10*3/uL — ABNORMAL LOW (ref 4.0–10.5)
nRBC: 0 % (ref 0.0–0.2)

## 2018-02-24 MED ORDER — DEXAMETHASONE SODIUM PHOSPHATE 10 MG/ML IJ SOLN
INTRAMUSCULAR | Status: AC
  Filled 2018-02-24: qty 1

## 2018-02-24 MED ORDER — SODIUM CHLORIDE 0.9% FLUSH
10.0000 mL | INTRAVENOUS | Status: DC | PRN
  Filled 2018-02-24: qty 10

## 2018-02-24 MED ORDER — LEUCOVORIN CALCIUM INJECTION 350 MG
400.0000 mg/m2 | Freq: Once | INTRAVENOUS | Status: AC
  Administered 2018-02-24: 728 mg via INTRAVENOUS
  Filled 2018-02-24: qty 36.4

## 2018-02-24 MED ORDER — OXALIPLATIN CHEMO INJECTION 100 MG/20ML
60.0000 mg/m2 | Freq: Once | INTRAVENOUS | Status: AC
  Administered 2018-02-24: 110 mg via INTRAVENOUS
  Filled 2018-02-24: qty 2

## 2018-02-24 MED ORDER — SODIUM CHLORIDE 0.9 % IV SOLN
2400.0000 mg/m2 | INTRAVENOUS | Status: DC
  Administered 2018-02-24: 4350 mg via INTRAVENOUS
  Filled 2018-02-24: qty 87

## 2018-02-24 MED ORDER — HEPARIN SOD (PORK) LOCK FLUSH 100 UNIT/ML IV SOLN
500.0000 [IU] | Freq: Once | INTRAVENOUS | Status: DC | PRN
  Filled 2018-02-24: qty 5

## 2018-02-24 MED ORDER — DEXTROSE 5 % IV SOLN
Freq: Once | INTRAVENOUS | Status: AC
  Administered 2018-02-24: 12:00:00 via INTRAVENOUS
  Filled 2018-02-24: qty 250

## 2018-02-24 MED ORDER — PALONOSETRON HCL INJECTION 0.25 MG/5ML
0.2500 mg | Freq: Once | INTRAVENOUS | Status: AC
  Administered 2018-02-24: 0.25 mg via INTRAVENOUS

## 2018-02-24 MED ORDER — DEXTROSE 5 % IV SOLN
Freq: Once | INTRAVENOUS | Status: AC
  Administered 2018-02-24: 09:00:00 via INTRAVENOUS
  Filled 2018-02-24: qty 250

## 2018-02-24 MED ORDER — PALONOSETRON HCL INJECTION 0.25 MG/5ML
INTRAVENOUS | Status: AC
  Filled 2018-02-24: qty 5

## 2018-02-24 MED ORDER — DEXAMETHASONE SODIUM PHOSPHATE 10 MG/ML IJ SOLN
10.0000 mg | Freq: Once | INTRAMUSCULAR | Status: AC
  Administered 2018-02-24: 10 mg via INTRAVENOUS

## 2018-02-24 NOTE — Patient Instructions (Signed)
Implanted Port Home Guide An implanted port is a type of central line that is placed under the skin. Central lines are used to provide IV access when treatment or nutrition needs to be given through a person's veins. Implanted ports are used for long-term IV access. An implanted port may be placed because:  You need IV medicine that would be irritating to the small veins in your hands or arms.  You need long-term IV medicines, such as antibiotics.  You need IV nutrition for a long period.  You need frequent blood draws for lab tests.  You need dialysis.  Implanted ports are usually placed in the chest area, but they can also be placed in the upper arm, the abdomen, or the leg. An implanted port has two main parts:  Reservoir. The reservoir is round and will appear as a small, raised area under your skin. The reservoir is the part where a needle is inserted to give medicines or draw blood.  Catheter. The catheter is a thin, flexible tube that extends from the reservoir. The catheter is placed into a large vein. Medicine that is inserted into the reservoir goes into the catheter and then into the vein.  How will I care for my incision site? Do not get the incision site wet. Bathe or shower as directed by your health care provider. How is my port accessed? Special steps must be taken to access the port:  Before the port is accessed, a numbing cream can be placed on the skin. This helps numb the skin over the port site.  Your health care provider uses a sterile technique to access the port. ? Your health care provider must put on a mask and sterile gloves. ? The skin over your port is cleaned carefully with an antiseptic and allowed to dry. ? The port is gently pinched between sterile gloves, and a needle is inserted into the port.  Only "non-coring" port needles should be used to access the port. Once the port is accessed, a blood return should be checked. This helps ensure that the port  is in the vein and is not clogged.  If your port needs to remain accessed for a constant infusion, a clear (transparent) bandage will be placed over the needle site. The bandage and needle will need to be changed every week, or as directed by your health care provider.  Keep the bandage covering the needle clean and dry. Do not get it wet. Follow your health care provider's instructions on how to take a shower or bath while the port is accessed.  If your port does not need to stay accessed, no bandage is needed over the port.  What is flushing? Flushing helps keep the port from getting clogged. Follow your health care provider's instructions on how and when to flush the port. Ports are usually flushed with saline solution or a medicine called heparin. The need for flushing will depend on how the port is used.  If the port is used for intermittent medicines or blood draws, the port will need to be flushed: ? After medicines have been given. ? After blood has been drawn. ? As part of routine maintenance.  If a constant infusion is running, the port may not need to be flushed.  How long will my port stay implanted? The port can stay in for as long as your health care provider thinks it is needed. When it is time for the port to come out, surgery will be   done to remove it. The procedure is similar to the one performed when the port was put in. When should I seek immediate medical care? When you have an implanted port, you should seek immediate medical care if:  You notice a bad smell coming from the incision site.  You have swelling, redness, or drainage at the incision site.  You have more swelling or pain at the port site or the surrounding area.  You have a fever that is not controlled with medicine.  This information is not intended to replace advice given to you by your health care provider. Make sure you discuss any questions you have with your health care provider. Document  Released: 03/08/2005 Document Revised: 08/14/2015 Document Reviewed: 11/13/2012 Elsevier Interactive Patient Education  2017 Elsevier Inc.  

## 2018-02-27 ENCOUNTER — Inpatient Hospital Stay: Payer: BC Managed Care – PPO

## 2018-02-27 DIAGNOSIS — C7931 Secondary malignant neoplasm of brain: Secondary | ICD-10-CM | POA: Diagnosis not present

## 2018-02-27 MED ORDER — PEGFILGRASTIM-CBQV 6 MG/0.6ML ~~LOC~~ SOSY
PREFILLED_SYRINGE | SUBCUTANEOUS | Status: AC
  Filled 2018-02-27: qty 0.6

## 2018-03-02 ENCOUNTER — Encounter: Payer: Self-pay | Admitting: Radiation Oncology

## 2018-03-02 ENCOUNTER — Ambulatory Visit
Admission: RE | Admit: 2018-03-02 | Discharge: 2018-03-02 | Disposition: A | Discharge status: Home / Self Care | Payer: BC Managed Care – PPO | Source: Ambulatory Visit | Attending: Radiation Oncology | Admitting: Radiation Oncology

## 2018-03-02 ENCOUNTER — Ambulatory Visit: Payer: BC Managed Care – PPO | Admitting: Family Medicine

## 2018-03-02 VITALS — BP 116/83 | HR 81 | Temp 98.3°F | Resp 20

## 2018-03-02 DIAGNOSIS — C7931 Secondary malignant neoplasm of brain: Secondary | ICD-10-CM | POA: Diagnosis not present

## 2018-03-02 NOTE — Op Note (Signed)
  Name: KHALLID PASILLAS  MRN: [id number]  Date: 03/02/2018   DOB: 1971/03/08  Stereotactic Radiosurgery Operative Note  PRE-OPERATIVE DIAGNOSIS:  Multiple Brain Metastases  POST-OPERATIVE DIAGNOSIS:  Multiple Brain Metastases  PROCEDURE:  Stereotactic Radiosurgery  SURGEON:  Peggyann Shoals, MD  NARRATIVE: The patient underwent a radiation treatment planning session in the radiation oncology simulation suite under the care of the radiation oncology physician and physicist.  I participated closely in the radiation treatment planning afterwards. The patient underwent planning CT which was fused to 3T high resolution MRI with 1 mm axial slices.  These images were fused on the planning system.  We contoured the gross target volumes and subsequently expanded this to yield the Planning Target Volume. I actively participated in the planning process.  I helped to define and review the target contours and also the contours of the optic pathway, eyes, brainstem and selected nearby organs at risk.  All the dose constraints for critical structures were reviewed and compared to AAPM Task Group 101.  The prescription dose conformity was reviewed.  I approved the plan electronically.    Accordingly, Renold Genta was brought to the TrueBeam stereotactic radiation treatment linac and placed in the custom immobilization mask.  The patient was aligned according to the IR fiducial markers with BrainLab Exactrac, then orthogonal x-rays were used in ExacTrac with the 6DOF robotic table and the shifts were made to align the patient  Renold Genta received stereotactic radiosurgery uneventfully.    Lesions treated:  1   Complex lesions treated:  0 (>3.5 cm, <20mm of optic path, or within the brainstem)   The detailed description of the procedure is recorded in the radiation oncology procedure note.  I was present for the duration of the procedure.  DISPOSITION:  Following delivery, the patient was transported to nursing in  stable condition and monitored for possible acute effects to be discharged to home in stable condition with follow-up in one month.  Peggyann Shoals, MD 03/02/2018 1:36 PM

## 2018-03-02 NOTE — Progress Notes (Signed)
Jacob [name] rested with [place] for 30 minutes following his SRS treatment.  Patient denies headache, dizziness, nausea, diplopia or ringing in the ears. Denies fatigue. Patient without complaints. Understands to avoid strenuous activity for the next 24 hours.  He was given a follow-up appointment. and told to call [phone] with needs.   BP 116/83 (BP Location: Right Arm, Patient Position: Sitting)   Pulse 81   Temp 98.3 F (36.8 C) (Oral)   Resp 20   SpO2 100%    Jacob [name] M. Leonie Green, BSN

## 2018-03-05 ENCOUNTER — Other Ambulatory Visit: Payer: Self-pay | Admitting: Oncology

## 2018-03-06 NOTE — Progress Notes (Signed)
  Radiation Oncology         [phone]) [phone] ________________________________  Name: Jacob [name] MRN: [id number]  Date: 03/02/2018  DOB: [birth year]  SRS End of Treatment Note  Diagnosis:   47 y.o. 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   There were no signs of acute toxicity after treatment.  Plan: The patient has completed radiation treatment. The patient will return to radiation oncology clinic for routine followup in one month. I advised the patient to call or return sooner if they have any questions or concerns related to their recovery or treatment. ________________________________  Jodelle Gross, MD, PhD  This document serves as a record of services personally performed by Kyung Rudd, MD. It was created on his behalf by Rae Lips, a trained medical scribe. The creation of this record is based on the scribe's personal observations and the provider's statements to them. This document has been checked and approved by the attending provider.

## 2018-03-09 ENCOUNTER — Inpatient Hospital Stay: Payer: BC Managed Care – PPO

## 2018-03-09 ENCOUNTER — Encounter: Payer: Self-pay | Admitting: Nurse Practitioner

## 2018-03-09 ENCOUNTER — Telehealth: Payer: Self-pay

## 2018-03-09 ENCOUNTER — Inpatient Hospital Stay (HOSPITAL_BASED_OUTPATIENT_CLINIC_OR_DEPARTMENT_OTHER): Payer: BC Managed Care – PPO | Admitting: Nurse Practitioner

## 2018-03-09 VITALS — BP 127/89 | HR 93 | Temp 98.0°F | Resp 18 | Ht 70.0 in | Wt 147.8 lb

## 2018-03-09 DIAGNOSIS — C7931 Secondary malignant neoplasm of brain: Secondary | ICD-10-CM | POA: Diagnosis not present

## 2018-03-09 DIAGNOSIS — D709 Neutropenia, unspecified: Secondary | ICD-10-CM

## 2018-03-09 DIAGNOSIS — H539 Unspecified visual disturbance: Secondary | ICD-10-CM

## 2018-03-09 DIAGNOSIS — R51 Headache: Secondary | ICD-10-CM

## 2018-03-09 DIAGNOSIS — Z5111 Encounter for antineoplastic chemotherapy: Secondary | ICD-10-CM | POA: Diagnosis not present

## 2018-03-09 DIAGNOSIS — C787 Secondary malignant neoplasm of liver and intrahepatic bile duct: Principal | ICD-10-CM

## 2018-03-09 DIAGNOSIS — C20 Malignant neoplasm of rectum: Secondary | ICD-10-CM

## 2018-03-09 DIAGNOSIS — R110 Nausea: Secondary | ICD-10-CM

## 2018-03-09 DIAGNOSIS — K5900 Constipation, unspecified: Secondary | ICD-10-CM

## 2018-03-09 DIAGNOSIS — D696 Thrombocytopenia, unspecified: Secondary | ICD-10-CM

## 2018-03-09 DIAGNOSIS — Z95828 Presence of other vascular implants and grafts: Secondary | ICD-10-CM

## 2018-03-09 LAB — CMP (CANCER CENTER ONLY)
ALT: 12 U/L (ref 0–44)
AST: 17 U/L (ref 15–41)
Albumin: 3.6 g/dL (ref 3.5–5.0)
Alkaline Phosphatase: 125 U/L (ref 38–126)
Anion gap: 8 (ref 5–15)
BUN: 12 mg/dL (ref 6–20)
CO2: 26 mmol/L (ref 22–32)
Calcium: 9 mg/dL (ref 8.9–10.3)
Chloride: 108 mmol/L (ref 98–111)
Creatinine: 0.95 mg/dL (ref 0.61–1.24)
GFR, Est AFR Am: >60 mL/min (ref >60)
Glucose, Bld: 98 mg/dL (ref 70–99)
Potassium: 4.1 mmol/L (ref 3.5–5.1)
Sodium: 142 mmol/L (ref 135–145)
Total Bilirubin: 0.4 mg/dL (ref 0.3–1.2)
Total Protein: 6.6 g/dL (ref 6.5–8.1)

## 2018-03-09 LAB — CBC WITH DIFFERENTIAL (CANCER CENTER ONLY)
Abs Immature Granulocytes: 0.01 10*3/uL (ref 0.00–0.07)
Basophils Absolute: 0 10*3/uL (ref 0.0–0.1)
Basophils Relative: 0 %
Eosinophils Absolute: 0 10*3/uL (ref 0.0–0.5)
Eosinophils Relative: 2 %
HEMATOCRIT: 29.8 % — AB (ref 39.0–52.0)
HEMOGLOBIN: 9 g/dL — AB (ref 13.0–17.0)
Immature Granulocytes: 1 %
LYMPHS ABS: 0.3 10*3/uL — AB (ref 0.7–4.0)
Lymphocytes Relative: 13 %
MCH: 25.8 pg — ABNORMAL LOW (ref 26.0–34.0)
MCHC: 30.2 g/dL (ref 30.0–36.0)
MCV: 85.4 fL (ref 80.0–100.0)
Monocytes Absolute: 0.4 10*3/uL (ref 0.1–1.0)
Monocytes Relative: 18 %
Neutro Abs: 1.3 10*3/uL — ABNORMAL LOW (ref 1.7–7.7)
Neutrophils Relative %: 66 %
Platelet Count: 84 10*3/uL — ABNORMAL LOW (ref 150–400)
RBC: 3.49 MIL/uL — ABNORMAL LOW (ref 4.22–5.81)
RDW: 18 % — ABNORMAL HIGH (ref 11.5–15.5)
WBC Count: 2 10*3/uL — ABNORMAL LOW (ref 4.0–10.5)
nRBC: 0 % (ref 0.0–0.2)

## 2018-03-09 LAB — CEA (IN HOUSE-CHCC): CEA (CHCC-In House): 78.58 ng/mL — ABNORMAL HIGH (ref 0.00–5.00)

## 2018-03-09 MED ORDER — PALONOSETRON HCL INJECTION 0.25 MG/5ML
INTRAVENOUS | Status: AC
  Filled 2018-03-09: qty 5

## 2018-03-09 MED ORDER — DEXTROSE 5 % IV SOLN
Freq: Once | INTRAVENOUS | Status: AC
  Administered 2018-03-09: 13:00:00 via INTRAVENOUS
  Filled 2018-03-09: qty 250

## 2018-03-09 MED ORDER — OXALIPLATIN CHEMO INJECTION 100 MG/20ML
60.0000 mg/m2 | Freq: Once | INTRAVENOUS | Status: AC
  Administered 2018-03-09: 110 mg via INTRAVENOUS
  Filled 2018-03-09: qty 20

## 2018-03-09 MED ORDER — LEUCOVORIN CALCIUM INJECTION 350 MG
400.0000 mg/m2 | Freq: Once | INTRAVENOUS | Status: AC
  Administered 2018-03-09: 728 mg via INTRAVENOUS
  Filled 2018-03-09: qty 36.4

## 2018-03-09 MED ORDER — SODIUM CHLORIDE 0.9% FLUSH
10.0000 mL | INTRAVENOUS | Status: DC | PRN
  Administered 2018-03-09: 10 mL
  Filled 2018-03-09: qty 10

## 2018-03-09 MED ORDER — HEPARIN SOD (PORK) LOCK FLUSH 100 UNIT/ML IV SOLN
500.0000 [IU] | Freq: Once | INTRAVENOUS | Status: DC | PRN
  Filled 2018-03-09: qty 5

## 2018-03-09 MED ORDER — DEXAMETHASONE 4 MG PO TABS: ORAL_TABLET | ORAL | 0 refills | Status: DC

## 2018-03-09 MED ORDER — DEXAMETHASONE SODIUM PHOSPHATE 10 MG/ML IJ SOLN
10.0000 mg | Freq: Once | INTRAMUSCULAR | Status: AC
  Administered 2018-03-09: 10 mg via INTRAVENOUS

## 2018-03-09 MED ORDER — DEXAMETHASONE SODIUM PHOSPHATE 10 MG/ML IJ SOLN
INTRAMUSCULAR | Status: AC
  Filled 2018-03-09: qty 1

## 2018-03-09 MED ORDER — PALONOSETRON HCL INJECTION 0.25 MG/5ML
0.2500 mg | Freq: Once | INTRAVENOUS | Status: AC
  Administered 2018-03-09: 0.25 mg via INTRAVENOUS

## 2018-03-09 MED ORDER — SODIUM CHLORIDE 0.9 % IV SOLN
2400.0000 mg/m2 | INTRAVENOUS | Status: DC
  Administered 2018-03-09: 4350 mg via INTRAVENOUS
  Filled 2018-03-09: qty 87

## 2018-03-09 MED ORDER — SODIUM CHLORIDE 0.9% FLUSH
10.0000 mL | INTRAVENOUS | Status: DC | PRN
  Filled 2018-03-09: qty 10

## 2018-03-09 NOTE — Progress Notes (Addendum)
Jacob [name]   Diagnosis: Rectal cancer  INTERVAL HISTORY:   Jacob [name] returns as scheduled.  He completed cycle 2 FOLFOX 02/24/2018.  He has intermittent nausea and constipation.  No mouth sores.  No neuropathy symptoms.  Over the past 3 to 4 days he has been experiencing increased headaches and decreased peripheral vision on the right.  He recently noticed a lump at the left chest.  He notes less hemoptysis.  Objective:  Vital signs in last 24 hours:  Blood pressure 127/89, pulse 93, temperature 98 F (36.7 C), temperature source Oral, resp. rate 18, height _0  (1.778 m), weight 147 lb 12.8 oz (67 kg), SpO2 100 %.    HEENT: Extraocular movements intact.  Peripheral vision diminished on the right. Resp: Lungs clear bilaterally. Cardio: Regular rate and rhythm. GI: Abdomen soft and nontender.  No hepatomegaly.  Left lower quadrant colostomy. Vascular: No leg edema. Neuro: Alert and oriented. Skin: Palms without erythema.  Tiny subcutaneous nodule at the left lateral chest wall. Port-A-Cath without erythema.   Lab Results:  Lab Results  Component Value Date   WBC 2.0 (L) 03/09/2018   HGB 9.0 (L) 03/09/2018   HCT 29.8 (L) 03/09/2018   MCV 85.4 03/09/2018   PLT 84 (L) 03/09/2018   NEUTROABS 1.3 (L) 03/09/2018    Imaging:  No results found.  Medications: I have reviewed the patient's current medications.  Assessment/Plan: 1. Rectal cancer. Partially obstructing mass noted 1-2 cm from the anal verge on a colonoscopy 03/06/2012. Endoscopic ultrasound 03/14/2012 with a 7.5 mm thick, 3.2 cm wide hypoechoic, irregularly bordered mass that clearly passed into and through the muscularis propria layer of the distal rectal wall (uT3); 3 small (largest 7 mm) perirectal lymph nodes. The lymph nodes were all round, discrete, hypoechoic, homogenous; suspicious for malignant involvement (uN1).  No RAS mutation identified by [facility] 1 testing on  the colon resection specimen 06/29/2012, APC alteration identified, microsatellite stable  He began radiation and concurrent Xeloda chemotherapy on 03/20/2012, completed 04/27/2012.   Low anterior resection/coloanal anastomosis and diverting ileostomy 06/29/2012 with the final pathology revealing a T2N0 tumor with extensive fibrosis and negative margins.   Cycle 1 of adjuvant CAPOX 07/19/2012. Cycle 5 of adjuvant CAPOX 10/11/2012.   CEA 2.5 on 11/14/2012.   CEA 14.6 03/08/2013.   Restaging CT evaluation 03/08/2013 with a 4 mm pulmonary nodule left lung base not identified on comparison exam; new liver lesions including a 29 x 26 mm irregular peripheral enhancing rounded lesion in the dome of the right hepatic lobe, a less well-defined new subcapsular lesion in the lateral right hepatic lobe measuring 12 mm, a new subcapsular lesion in the anterior right hepatic lobe adjacent to the gallbladder fossa measuring 10 mm; a rounded low-density lesion in the inferior right hepatic lobe measuring 10 mm compared to 7 mm on the prior study (radiologist commented this may represent an enlarged cyst).   MRI of the abdomen 04/12/2013 confirmed multiple T2 hyperintense metastatic lesions throughout the right liver   Initiation of FOLFIRI/Avastin with genotype based irinotecan dosing per the [facility] study 04/05/2013.   Restaging CT evaluation on 06/20/2013 (after 2 cycles/4 treatments) showed improvement in the liver metastases and stable size of a left lower lobe pulmonary nodule now with central cavitation.   Continuation of FOLFIRI/Avastin.   Restaging CT evaluation 08/14/2013-decrease in the size of liver metastases, slight decrease in the size of a cavitary left lower lobe nodule, no evidence of disease progression.  Status post right hepatic lobectomy 09/17/2013. Pathology showed multiple foci of metastatic adenocarcinoma (5 nodules of metastatic adenocarcinoma 4 of which are subcapsular with  the nodules ranging in size from 0.6-1.8 cm in greatest dimension). Margins not involved. Biopsy of a portal lymph node showed benign adipose tissue; no lymph node tissue or malignancy.   Normal CEA 11/06/2013   CT 11/06/2013 with a new pleural-based right lower chest lesion, slight in enlargement of the a left-sided lung nodule  CT 01/29/2014 with a decrease in the right lower chest pleural-based lesion and a slight increase of a left-sided lung lesion, other lung lesions were stable  CT chest 05/02/2014 with a decrease in the size of a right lower lobe pulmonary nodule months similar size of a dominant left lower lobe nodule, minimal enlargement of a smaller left lower lobe nodule, no new site of disease  CT chest 11/07/2014 with a slight increase in several left-sided lung nodules  CT chest 02/10/2015 with new small left hilar lymph nodes, a possible new left lower lobe nodule, and stability of other lung nodules  PET scan 03/12/2015 with hypermetabolic left hilar nodes, hypermetabolic left lower lobe nodules, hyper metabolic retroperitoneal nodes, intense hypermetabolism at the coloanal anastomosis, and hypermetabolic thickening in the presacral space  Cycle 1 FOLFIRI/PANITUMUMAB 05/21/2015  Cycle 2 FOLFIRI/PANITUMUMAB 06/05/2015  Cycle 3 FOLFIRI/PANITUMUMAB 06/19/2015  Cycle 4 FOLFIRI 07/10/2015  Cycle 5 FOLFIRI 07/24/2015  Restaging CTs 08/06/2015-resolution of hilar/retroperitoneal adenopathy, improvement in the hypermetabolic lung nodule, other lung nodules are stable, no new lesions  Cycle 6 FOLFIRI with PANITUMUMAB 08/28/2015 -panitumumab dose reduced  Cycle 7 FOLFIRI 09/11/2015-no panitumumab given  Cycle 8 FOLFIRI with PANITUMUMAB 09/25/2015-PANITUMUMAB dose reduced  Cycle 9 FOLFIRI 10/09/2015-no PANITUMUMAB given  Cycle 10 FOLFIRI with panitumumab 10/23/2015  CTs 11/06/2015-possible slight enlargement of a left lower lobe nodule, no other evidence of disease  progression.  CTs 02/16/2016-enlargement of left-sided pulmonary nodules and mediastinal/left hilar nodes, improved splenomegaly  CTs 06/21/2016-increased size of mediastinal/left hilar nodes, increased left lung nodules, and increased soft tissue at the porta hepatis  CTs 10/28/2016-progressive disease in the chest and abdomen with an enlarging left hilar mass and slight interval enlargement of pulmonary lesions. Right upper quadrant necrotic nodal mass significantly increased in size with mass effect on the liver and possible invasion. Significant compression of the intrahepatic IVC. New right hepatic lobe lesion. Moderate pelvic ascites.  Cycle 1 FOLFIRI/PANITUMUMAB 11/04/2016  Cycle 2 FOLFIRI/PANITUMUMAB 11/18/2016  Cycle 3 FOLFIRI/panitumumab 12/02/2016  Cycle4 FOLFIRI/PANITUMUMAB 12/15/2016  Cycle 5 FOLFIRI/PANITUMUMAB 12/30/2016  CTs 01/06/2017-no evidence of progressive disease, decreased chest adenopathy, stable left lower lobe nodules, decreased liver metastasis, decreased right retroperitoneal mass  Cycle 6 FOLFIRI/panitumumab 01/13/2017  Cycle 7 FOLFIRI/Panitumumab 01/27/2017  Cycle 8 FOLFIRI/panitumumab 02/17/2017  Cycle 9 FOLFIRI/Panitumumab 03/03/2017  Cycle 10 FOLFIRI/Panitumumab 03/24/2017  CTs 04/06/2017-enlargement of right adrenal mass (smaller than October 2019), stable left hilar fullness and lung nodules  Cycle 11 FOLFIRI/Panitumumab 04/07/2017  Cycle 12 FOLFIRI/panitumumab 04/21/2017  Cycle 13 FOLFIRI/Panitumumab 05/05/2017  Palliative radiation to the right adrenal mass 226 2019-3 02/2018  Brain MRI 07/28/2017-4intracranial metastasis in the right frontal lobe, left parietal lobe and left occipital lobe. Surrounding edema pronounced in the left occipital lobe.  SBRT to for brain lesions on 08/12/2017  CTs 08/10/2017- new left upper lobe airspace process, stable lung nodules and medius and lymphadenopathy, decreased mass involving the adrenal gland with  progression of a necrotic anterior portion of the mass, no liver lesions  Cycle 14 FOLFIRI/panitumumab 08/19/2017, irinotecan dose  reduced secondary to thrombocytopenia, Panitumumab dose escalated  Cycle 15 FOLFIRI/panitumumab 09/01/2017) Panitumumab dose reduced secondary to an early rash following cycle 14  CT chest 10/24/2017- progression of disease in the left lung; mild progression of left hilar nodal disease with new right hilar lymphadenopathy and progressive increase in lymph nodes in the mediastinum; interval progression of caudate lobe metastatic lesion.  Cycle 1 FOLFIRI/Panitumumab 11/03/2017  Cycle 2 FOLFIRI/Panitumumab 11/17/2017  Cycle 3 FOLFIRI/panitumumab 12/08/2017 (Panitumumab dose increased)  Cycle 4 FOLFIRI/Panitumumab 12/22/2017  Cycle 5 FOLFIRI/Panitumumab 01/05/2018  CTs 01/17/2018- new 4 cm posterior liver mass. Mild growth of separate heterogeneous 5.9 cm mass posterior margin of the liver near the IVC. Newbandlike low-attenuation focus in the posterior inferior liver, indeterminate, favoring expansile right portal vein thrombus. Stable right adrenal metastasis. Mixed response in the chest. Left hilar/AP window adenopathy mildly increased. Right paratracheal adenopathy mildly increased. Right hilar adenopathy decreased. Left lower lobe pulmonary nodule stable to mildly decreased. Tiny right upper lobe pulmonary nodule slightly increased. New 2.7 cm sub-solid pulmonary nodule basilar right lower lobe. Waxing and waning left upper lobe nodular foci of consolidation. New mild splenomegaly.  Cycle 1 FOLFOX 02/02/2018  Brain MRI 02/13/2018-new left frontal parietal lesion, mild progression of enhancing lesion in the left occipital lobe-previously treated lesion  Cycle 2 FOLFOX 02/24/2018  SRS to 1 brain lesion 03/02/2018  Cycle 3 FOLFOX 03/09/2018 2. History ofIrregular bowel habits/rectal bleeding secondary to #1. 3. History of Mild elevation of the liver  enzymes. Question secondary to hepatic steatosis. 4. Indeterminate 8 mm posterior right liver lesion on the staging CT 03/06/2012. 5. Mildly elevated CEA at 5.7 on 03/06/2012. 6. History of radiation erythema at the groin and perineum 7. 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 No clinical evidence of thrombophlebitis or extravasation. 8. Delayed nausea following chemotherapy-Decadron prophylaxis was added with cycle 3 CAPOX-improved. 9. Ileostomy takedown 12/07/2012. 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 11. Port-A-Cath placement 12/31/204 12. Severe neutropenia secondary to chemotherapy following cycle 1 of FOLFIRIin 2015, chemotherapy was dose reduced and he received Neulasta with day 15 cycle 1  13. Nausea and vomiting following cycle 1 of FOLFIRI 14. Rectal stricture-manual/colonoscopic dilatation by Dr. Ardis Hughs 03/01/2016  APR 08/17/2016-no evidence of malignancy 15. History ofLeukopenia/Thrombocytopenia-persistent, potentially a sequelae of chemotherapy or hepatic toxicity from chemotherapy/radiation. Bone marrow biopsy 11/20/2014 showed cellular bone marrow with trilineage hematopoiesis. Significant dyspoiesis was not present and there was no evidence of metastatic carcinoma. Cytogenetic analysis showed the presence of normal male chromosomes with no observable clonal chromosomal abnormalities.  probable cirrhosis 16. Genetic testing-negative genetic panel in March 2014 17. 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 Severe over the face-steroid Dosepak prescribed 07/03/2015. Improved 07/10/2015. Further improved 07/24/2015, 08/07/2015, 08/28/2015 18. Diarrhea secondary to chemotherapy-encouraged to use Imodium  19. History of Paronychia secondary to PANITUMUMAB 20. 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 History of neutropenia, thrombocytopenia 06/23/2017. Possibly related to radiation. He also has a history of suspected portal hypertension secondaryto toxicity from chemotherap 22. Seizure in the setting of brain metastases 08/05/2017, now maintained onLamictal 23. Altered mental status evaluated by neuro-oncology, Keppra discontinued, improved    Disposition: Jacob [name] appears stable.  He has completed 2 cycles of FOLFOX.  Plan to proceed with cycle 3 today as scheduled.  We reviewed the CBC from today.  He has stable mild neutropenia and thrombocytopenia.  Counts  are adequate to proceed with treatment.  He will receive Udenyca on the day of pump discontinuation.  We will ask radiation oncology or Dr. Mickeal Skinner to evaluate the headaches and decreased peripheral vision on the right.  Jacob [name] will return for lab, follow-up and the next cycle of FOLFOX on 03/27/2018.  He will contact the office in the interim with any problems.  Patient seen with Dr. Benay Spice.  25 minutes were spent face-to-face at today's visit with the majority of that time involved in counseling/coordination of care.  Addendum-I spoke with Dr. Mickeal Skinner.  The recommendation is to begin dexamethasone.  I sent a prescription to Jacob [name].  Dr. Mickeal Skinner will see him in follow-up next week.  Ned Card ANP/GNP-BC   03/09/2018  11:25 AM This was a shared visit with Ned Card.  Jacob [name] has developed a visual disturbance following SRS therapy last week.  He will be placed on Decadron and he will follow-up with Dr. Mickeal Skinner.  He will continue FOLFOX chemotherapy.  He is overall performance status appears improved  Julieanne Manson, MD

## 2018-03-09 NOTE — Telephone Encounter (Signed)
TC to Infusion Nurse [place] to relay a message to Pt. That Lattie Haw spoke with Dr. Mickeal Skinner and the recommendation was to start dexamethasone, a prescription was sent to the pharmacy and he will see Dr. Mickeal Skinner for follow up next week, Per Lattie Haw scheduling message sent for appointment.

## 2018-03-09 NOTE — Patient Instructions (Signed)
See you Saturday at 1:30pm!  [facility] Discharge Instructions for Patients Receiving Chemotherapy  Today you received the following chemotherapy agents: Oxaliplatin, Leucovorin, Fluorouracil.  To help prevent nausea and vomiting after your treatment, we encourage you to take your nausea medication as prescribed.   If you develop nausea and vomiting that is not controlled by your nausea medication, call the clinic.   BELOW ARE SYMPTOMS THAT SHOULD BE REPORTED IMMEDIATELY:  *FEVER GREATER THAN 100.5 F  *CHILLS WITH OR WITHOUT FEVER  NAUSEA AND VOMITING THAT IS NOT CONTROLLED WITH YOUR NAUSEA MEDICATION  *UNUSUAL SHORTNESS OF BREATH  *UNUSUAL BRUISING OR BLEEDING  TENDERNESS IN MOUTH AND THROAT WITH OR WITHOUT PRESENCE OF ULCERS  *URINARY PROBLEMS  *BOWEL PROBLEMS  UNUSUAL RASH Items with * indicate a potential emergency and should be followed up as soon as possible.  Feel free to call the clinic should you have any questions or concerns. The clinic phone number is [phone].  Please show the [place] at check-in to the Emergency Department and triage nurse.

## 2018-03-10 ENCOUNTER — Telehealth: Payer: Self-pay | Admitting: Internal Medicine

## 2018-03-10 ENCOUNTER — Telehealth: Payer: Self-pay | Admitting: Nurse Practitioner

## 2018-03-10 NOTE — Progress Notes (Signed)
  Radiation Oncology         [phone] ________________________________  Name: Jacob [name] MRN: [id number]  Date: 03/02/2018  DOB: [birth year]   SPECIAL TREATMENT PROCEDURE   3D TREATMENT PLANNING AND DOSIMETRY: The patient's radiation plan was reviewed and approved by Dr. Vertell Limber from neurosurgery and radiation oncology prior to treatment. It showed 3-dimensional radiation distributions overlaid onto the planning CT/MRI image set. The [facility] for the target structures as well as the organs at risk were reviewed. The documentation of the 3D plan and dosimetry are filed in the radiation oncology EMR.   NARRATIVE: The patient was brought to the TrueBeam stereotactic radiation treatment machine and placed supine on the CT couch. The head frame was applied, and the patient was set up for stereotactic radiosurgery. Neurosurgery was present for the set-up and delivery   SIMULATION VERIFICATION: In the couch zero-angle position, the patient underwent Exactrac imaging using the Brainlab system with orthogonal KV images. These were carefully aligned and repeated to confirm treatment position for each of the isocenters. The Exactrac snap film verification was repeated at each couch angle.   SPECIAL TREATMENT PROCEDURE: The patient received stereotactic radiosurgery to the following target:  PTV1 target was treated using 3 Arcs to a prescription dose of 20 Gy. ExacTrac Snap verification was performed for each couch angle.   STEREOTACTIC TREATMENT MANAGEMENT: Following delivery, the patient was transported to nursing in stable condition and monitored for possible acute effects. Vital signs were recorded . The patient tolerated treatment without significant acute effects, and was discharged to home in stable condition.  PLAN: Follow-up in one month.   ------------------------------------------------  Jodelle Gross, MD, PhD

## 2018-03-10 NOTE — Telephone Encounter (Signed)
Scheduled appt per 12/19 los - pt to get an updated schedule next visit - next week

## 2018-03-10 NOTE — Telephone Encounter (Signed)
schedled appt per 12/19 sch message - pt is aware of appt date and time

## 2018-03-11 ENCOUNTER — Inpatient Hospital Stay: Payer: BC Managed Care – PPO

## 2018-03-11 VITALS — BP 144/95 | HR 98 | Temp 97.9°F | Resp 17 | Ht 70.0 in

## 2018-03-11 DIAGNOSIS — Z5111 Encounter for antineoplastic chemotherapy: Secondary | ICD-10-CM | POA: Diagnosis not present

## 2018-03-11 DIAGNOSIS — C20 Malignant neoplasm of rectum: Secondary | ICD-10-CM

## 2018-03-11 DIAGNOSIS — C787 Secondary malignant neoplasm of liver and intrahepatic bile duct: Principal | ICD-10-CM

## 2018-03-11 MED ORDER — PEGFILGRASTIM-CBQV 6 MG/0.6ML ~~LOC~~ SOSY
PREFILLED_SYRINGE | SUBCUTANEOUS | Status: AC
  Filled 2018-03-11: qty 0.6

## 2018-03-11 MED ORDER — HEPARIN SOD (PORK) LOCK FLUSH 100 UNIT/ML IV SOLN
500.0000 [IU] | Freq: Once | INTRAVENOUS | Status: AC | PRN
  Administered 2018-03-11: 500 [IU]
  Filled 2018-03-11: qty 5

## 2018-03-11 MED ORDER — SODIUM CHLORIDE 0.9% FLUSH
10.0000 mL | INTRAVENOUS | Status: DC | PRN
  Administered 2018-03-11: 10 mL
  Filled 2018-03-11: qty 10

## 2018-03-11 MED ORDER — PEGFILGRASTIM-CBQV 6 MG/0.6ML ~~LOC~~ SOSY
6.0000 mg | PREFILLED_SYRINGE | Freq: Once | SUBCUTANEOUS | Status: AC
  Administered 2018-03-11: 6 mg via SUBCUTANEOUS

## 2018-03-13 ENCOUNTER — Telehealth: Payer: Self-pay | Admitting: Oncology

## 2018-03-13 ENCOUNTER — Inpatient Hospital Stay (HOSPITAL_BASED_OUTPATIENT_CLINIC_OR_DEPARTMENT_OTHER): Payer: BC Managed Care – PPO | Admitting: Internal Medicine

## 2018-03-13 ENCOUNTER — Encounter: Payer: Self-pay | Admitting: Internal Medicine

## 2018-03-13 VITALS — BP 117/78 | HR 95 | Temp 98.2°F | Resp 18 | Ht 70.0 in | Wt 148.2 lb

## 2018-03-13 DIAGNOSIS — Z5111 Encounter for antineoplastic chemotherapy: Secondary | ICD-10-CM | POA: Diagnosis not present

## 2018-03-13 DIAGNOSIS — C7931 Secondary malignant neoplasm of brain: Secondary | ICD-10-CM | POA: Diagnosis not present

## 2018-03-13 DIAGNOSIS — H53461 Homonymous bilateral field defects, right side: Secondary | ICD-10-CM | POA: Insufficient documentation

## 2018-03-13 DIAGNOSIS — R569 Unspecified convulsions: Secondary | ICD-10-CM

## 2018-03-13 NOTE — Telephone Encounter (Signed)
Called regarding voicemail

## 2018-03-13 NOTE — Progress Notes (Signed)
[facility] [place], [place] [address] [phone]   Interval Evaluation  Date of Service: 03/13/18 Patient Name: Jacob [name] Patient MRN: [id number] Patient DOB: 02-05-1971 Provider: Ventura Sellers, MD  Identifying Statement:  Jacob [name] is a 47 y.o. male with No primary diagnosis found.   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[place])    03/20/2012 - 04/27/2012 Radiation Therapy    RT for 28 Fractions w / Xeloda     04/30/2015 - 01/18/2018 Chemotherapy    The patient had palonosetron (ALOXI) injection 0.25 mg, 0.25 mg, Intravenous,  Once, 30 of 32 cycles Administration: 0.25 mg (05/21/2015), 0.25 mg (06/05/2015), 0.25 mg (06/19/2015), 0.25 mg (07/10/2015), 0.25 mg (07/24/2015), 0.25 mg (08/28/2015), 0.25 mg (09/11/2015), 0.25 mg (09/25/2015), 0.25 mg (10/09/2015), 0.25 mg (10/23/2015), 0.25 mg (11/04/2016), 0.25 mg (11/18/2016), 0.25 mg (12/02/2016), 0.25 mg (12/15/2016), 0.25 mg (12/30/2016), 0.25 mg (01/13/2017), 0.25 mg (01/27/2017), 0.25 mg (02/17/2017), 0.25 mg (03/03/2017), 0.25 mg (03/24/2017), 0.25 mg (04/07/2017), 0.25 mg (04/21/2017), 0.25 mg (05/05/2017), 0.25 mg (08/19/2017), 0.25 mg (09/01/2017), 0.25 mg (11/03/2017), 0.25 mg (11/17/2017), 0.25 mg (12/08/2017), 0.25 mg (12/22/2017), 0.25 mg (01/05/2018) pegfilgrastim (NEULASTA) injection 6 mg, 6 mg, Subcutaneous, Once, 23 of 23 cycles Administration: 6 mg (05/23/2015), 6 mg (06/07/2015), 6 mg (06/21/2015), 6 mg (07/12/2015), 6 mg (07/26/2015), 6 mg (08/30/2015), 6 mg (09/13/2015), 6 mg (09/27/2015), 6 mg (10/11/2015), 6 mg (10/25/2015), 6 mg (11/06/2016), 6 mg (11/20/2016), 6 mg (12/04/2016), 6 mg (12/17/2016), 6 mg (01/01/2017), 6 mg (01/15/2017), 6 mg (01/29/2017), 6 mg (02/19/2017), 6 mg (03/05/2017), 6 mg (03/26/2017), 6 mg (04/09/2017), 6 mg (04/23/2017), 6 mg (05/07/2017) pegfilgrastim-cbqv (UDENYCA)  injection 6 mg, 6 mg, Subcutaneous, Once, 6 of 8 cycles Administration: 6 mg (08/22/2017), 6 mg (11/05/2017), 6 mg (11/19/2017), 6 mg (12/10/2017), 6 mg (12/24/2017), 6 mg (01/07/2018) irinotecan (CAMPTOSAR) 258 mg in dextrose 5 % 500 mL chemo infusion, 135 mg/m2 = 258 mg (75 % of original dose 180 mg/m2), Intravenous,  Once, 30 of 32 cycles Dose modification: 135 mg/m2 (original dose 180 mg/m2, Cycle 1, Reason: Provider Judgment), 100 mg/m2 (original dose 180 mg/m2, Cycle 24, Reason: Provider Judgment) Administration: 258 mg (05/21/2015), 260 mg (06/05/2015), 258 mg (06/19/2015), 258 mg (07/10/2015), 260 mg (07/24/2015), 260 mg (08/28/2015), 260 mg (09/11/2015), 260 mg (09/25/2015), 260 mg (10/23/2015), 260 mg (11/04/2016), 240 mg (11/18/2016), 240 mg (12/02/2016), 240 mg (12/15/2016), 240 mg (12/30/2016), 240 mg (01/13/2017), 240 mg (01/27/2017), 240 mg (02/17/2017), 240 mg (03/03/2017), 240 mg (03/24/2017), 240 mg (04/07/2017), 240 mg (04/21/2017), 240 mg (05/05/2017), 180 mg (08/19/2017), 180 mg (09/01/2017), 180 mg (11/03/2017), 180 mg (11/17/2017), 180 mg (12/08/2017), 180 mg (12/22/2017), 180 mg (01/05/2018) leucovorin 764 mg in dextrose 5 % 250 mL infusion, 400 mg/m2 = 764 mg, Intravenous,  Once, 30 of 32 cycles Administration: 764 mg (05/21/2015), 764 mg (06/05/2015), 764 mg (06/19/2015), 764 mg (07/10/2015), 760 mg (07/24/2015), 760 mg (08/28/2015), 760 mg (09/11/2015), 760 mg (09/25/2015), 764 mg (10/09/2015), 764 mg (10/23/2015), 764 mg (11/04/2016), 764 mg (11/18/2016), 764 mg (12/02/2016), 764 mg (12/15/2016), 716 mg (12/30/2016), 716 mg (01/13/2017), 716 mg (01/27/2017), 716 mg (02/17/2017), 716 mg (03/03/2017), 716 mg (03/24/2017), 716 mg (04/07/2017), 716 mg (04/21/2017), 716 mg (05/05/2017), 736 mg (08/19/2017), 736 mg (09/01/2017), 736 mg (11/03/2017), 736 mg (11/17/2017), 736 mg (12/08/2017), 736 mg (12/22/2017), 736 mg (01/05/2018) fluorouracil (  ADRUCIL) chemo injection 750 mg, 400 mg/m2 = 750 mg, Intravenous,  Once, 10 of 10 cycles Administration: 750  mg (05/21/2015), 750 mg (06/05/2015), 750 mg (06/19/2015), 750 mg (07/10/2015), 750 mg (07/24/2015), 750 mg (08/28/2015), 750 mg (09/11/2015), 750 mg (09/25/2015), 750 mg (10/09/2015), 750 mg (10/23/2015) leucovorin injection 38 mg, 20 mg/m2 = 38 mg, Intravenous,  Once, 1 of 1 cycle fosaprepitant (EMEND) 150 mg, dexamethasone (DECADRON) 12 mg in sodium chloride 0.9 % 145 mL IVPB, , Intravenous,  Once, 30 of 32 cycles Administration:  (05/21/2015),  (06/05/2015),  (06/19/2015),  (07/10/2015),  (07/24/2015),  (08/28/2015),  (09/11/2015),  (09/25/2015),  (10/09/2015),  (10/23/2015),  (11/04/2016),  (11/18/2016),  (12/02/2016),  (12/15/2016),  (12/30/2016),  (01/13/2017),  (01/27/2017),  (02/17/2017),  (03/03/2017),  (03/24/2017),  (04/07/2017),  (04/21/2017),  (05/05/2017),  (08/19/2017),  (09/01/2017),  (11/03/2017),  (11/17/2017),  (12/08/2017),  (12/22/2017),  (01/05/2018) fluorouracil (ADRUCIL) 4,600 mg in sodium chloride 0.9 % 58 mL chemo infusion, 2,400 mg/m2 = 4,600 mg, Intravenous, 1 Day/Dose, 30 of 32 cycles Administration: 4,600 mg (05/21/2015), 4,600 mg (06/05/2015), 4,600 mg (06/19/2015), 4,600 mg (07/10/2015), 4,600 mg (07/24/2015), 4,600 mg (08/28/2015), 4,600 mg (09/11/2015), 4,600 mg (09/25/2015), 4,600 mg (10/09/2015), 4,600 mg (10/23/2015), 4,600 mg (11/04/2016), 4,600 mg (11/18/2016), 4,600 mg (12/02/2016), 4,600 mg (12/15/2016), 4,300 mg (12/30/2016), 4,300 mg (01/13/2017), 4,300 mg (01/27/2017), 4,300 mg (02/17/2017), 4,300 mg (03/03/2017), 4,300 mg (03/24/2017), 4,300 mg (04/07/2017), 4,300 mg (04/21/2017), 4,300 mg (05/05/2017), 4,300 mg (08/19/2017), 4,400 mg (09/01/2017), 4,400 mg (11/03/2017), 4,400 mg (11/17/2017), 4,400 mg (12/08/2017), 4,400 mg (12/22/2017), 4,400 mg (01/05/2018)  for chemotherapy treatment.     02/02/2018 -  Chemotherapy    The patient had palonosetron (ALOXI) injection 0.25 mg, 0.25 mg, Intravenous,  Once, 3 of 4 cycles Administration: 0.25 mg (02/02/2018), 0.25 mg (02/24/2018), 0.25 mg (03/09/2018) pegfilgrastim-cbqv (UDENYCA) injection  6 mg, 6 mg, Subcutaneous, Once, 2 of 3 cycles Administration: 6 mg (03/11/2018) leucovorin 728 mg in dextrose 5 % 250 mL infusion, 400 mg/m2 = 728 mg, Intravenous,  Once, 3 of 4 cycles Administration: 728 mg (02/02/2018), 728 mg (02/24/2018), 728 mg (03/09/2018) oxaliplatin (ELOXATIN) 150 mg in dextrose 5 % 500 mL chemo infusion, 82 mg/m2 = 155 mg, Intravenous,  Once, 3 of 4 cycles Dose modification: 60 mg/m2 (original dose 85 mg/m2, Cycle 2, Reason: Provider Judgment) Administration: 150 mg (02/02/2018), 110 mg (02/24/2018), 110 mg (03/09/2018) fluorouracil (ADRUCIL) 4,350 mg in sodium chloride 0.9 % 63 mL chemo infusion, 2,400 mg/m2 = 4,350 mg, Intravenous, 1 Day/Dose, 3 of 4 cycles Administration: 4,350 mg (02/02/2018), 4,350 mg (02/24/2018), 4,350 mg (03/09/2018)  for chemotherapy treatment.      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[place])   09/17/2013 Initial Diagnosis    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[place])    04/30/2015 - 01/18/2018 Chemotherapy    The patient had palonosetron (ALOXI) injection 0.25 mg, 0.25 mg, Intravenous,  Once, 30 of 32 cycles Administration: 0.25 mg (05/21/2015), 0.25 mg (06/05/2015), 0.25 mg (06/19/2015), 0.25 mg (07/10/2015), 0.25 mg (07/24/2015), 0.25 mg (08/28/2015), 0.25 mg (09/11/2015), 0.25 mg (09/25/2015), 0.25 mg (10/09/2015), 0.25 mg (10/23/2015), 0.25 mg (11/04/2016), 0.25 mg (11/18/2016), 0.25 mg (12/02/2016), 0.25 mg (12/15/2016), 0.25 mg (12/30/2016), 0.25 mg (01/13/2017), 0.25 mg (01/27/2017), 0.25 mg (02/17/2017), 0.25 mg (03/03/2017), 0.25 mg (03/24/2017), 0.25 mg (04/07/2017), 0.25 mg (04/21/2017), 0.25 mg (05/05/2017), 0.25 mg (08/19/2017), 0.25 mg (09/01/2017), 0.25 mg (11/03/2017), 0.25 mg (11/17/2017), 0.25 mg (12/08/2017), 0.25 mg (12/22/2017), 0.25 mg (01/05/2018) pegfilgrastim (NEULASTA) injection 6 mg, 6 mg, Subcutaneous, Once, 23 of 23 cycles  Administration: 6 mg (05/23/2015), 6 mg (06/07/2015), 6 mg (06/21/2015), 6 mg (07/12/2015), 6 mg (07/26/2015), 6 mg (08/30/2015), 6 mg  (09/13/2015), 6 mg (09/27/2015), 6 mg (10/11/2015), 6 mg (10/25/2015), 6 mg (11/06/2016), 6 mg (11/20/2016), 6 mg (12/04/2016), 6 mg (12/17/2016), 6 mg (01/01/2017), 6 mg (01/15/2017), 6 mg (01/29/2017), 6 mg (02/19/2017), 6 mg (03/05/2017), 6 mg (03/26/2017), 6 mg (04/09/2017), 6 mg (04/23/2017), 6 mg (05/07/2017) pegfilgrastim-cbqv (UDENYCA) injection 6 mg, 6 mg, Subcutaneous, Once, 6 of 8 cycles Administration: 6 mg (08/22/2017), 6 mg (11/05/2017), 6 mg (11/19/2017), 6 mg (12/10/2017), 6 mg (12/24/2017), 6 mg (01/07/2018) irinotecan (CAMPTOSAR) 258 mg in dextrose 5 % 500 mL chemo infusion, 135 mg/m2 = 258 mg (75 % of original dose 180 mg/m2), Intravenous,  Once, 30 of 32 cycles Dose modification: 135 mg/m2 (original dose 180 mg/m2, Cycle 1, Reason: Provider Judgment), 100 mg/m2 (original dose 180 mg/m2, Cycle 24, Reason: Provider Judgment) Administration: 258 mg (05/21/2015), 260 mg (06/05/2015), 258 mg (06/19/2015), 258 mg (07/10/2015), 260 mg (07/24/2015), 260 mg (08/28/2015), 260 mg (09/11/2015), 260 mg (09/25/2015), 260 mg (10/23/2015), 260 mg (11/04/2016), 240 mg (11/18/2016), 240 mg (12/02/2016), 240 mg (12/15/2016), 240 mg (12/30/2016), 240 mg (01/13/2017), 240 mg (01/27/2017), 240 mg (02/17/2017), 240 mg (03/03/2017), 240 mg (03/24/2017), 240 mg (04/07/2017), 240 mg (04/21/2017), 240 mg (05/05/2017), 180 mg (08/19/2017), 180 mg (09/01/2017), 180 mg (11/03/2017), 180 mg (11/17/2017), 180 mg (12/08/2017), 180 mg (12/22/2017), 180 mg (01/05/2018) leucovorin 764 mg in dextrose 5 % 250 mL infusion, 400 mg/m2 = 764 mg, Intravenous,  Once, 30 of 32 cycles Administration: 764 mg (05/21/2015), 764 mg (06/05/2015), 764 mg (06/19/2015), 764 mg (07/10/2015), 760 mg (07/24/2015), 760 mg (08/28/2015), 760 mg (09/11/2015), 760 mg (09/25/2015), 764 mg (10/09/2015), 764 mg (10/23/2015), 764 mg (11/04/2016), 764 mg (11/18/2016), 764 mg (12/02/2016), 764 mg (12/15/2016), 716 mg (12/30/2016), 716 mg (01/13/2017), 716 mg (01/27/2017), 716 mg (02/17/2017), 716 mg (03/03/2017), 716 mg  (03/24/2017), 716 mg (04/07/2017), 716 mg (04/21/2017), 716 mg (05/05/2017), 736 mg (08/19/2017), 736 mg (09/01/2017), 736 mg (11/03/2017), 736 mg (11/17/2017), 736 mg (12/08/2017), 736 mg (12/22/2017), 736 mg (01/05/2018) fluorouracil (ADRUCIL) chemo injection 750 mg, 400 mg/m2 = 750 mg, Intravenous,  Once, 10 of 10 cycles Administration: 750 mg (05/21/2015), 750 mg (06/05/2015), 750 mg (06/19/2015), 750 mg (07/10/2015), 750 mg (07/24/2015), 750 mg (08/28/2015), 750 mg (09/11/2015), 750 mg (09/25/2015), 750 mg (10/09/2015), 750 mg (10/23/2015) leucovorin injection 38 mg, 20 mg/m2 = 38 mg, Intravenous,  Once, 1 of 1 cycle fosaprepitant (EMEND) 150 mg, dexamethasone (DECADRON) 12 mg in sodium chloride 0.9 % 145 mL IVPB, , Intravenous,  Once, 30 of 32 cycles Administration:  (05/21/2015),  (06/05/2015),  (06/19/2015),  (07/10/2015),  (07/24/2015),  (08/28/2015),  (09/11/2015),  (09/25/2015),  (10/09/2015),  (10/23/2015),  (11/04/2016),  (11/18/2016),  (12/02/2016),  (12/15/2016),  (12/30/2016),  (01/13/2017),  (01/27/2017),  (02/17/2017),  (03/03/2017),  (03/24/2017),  (04/07/2017),  (04/21/2017),  (05/05/2017),  (08/19/2017),  (09/01/2017),  (11/03/2017),  (11/17/2017),  (12/08/2017),  (12/22/2017),  (01/05/2018) fluorouracil (ADRUCIL) 4,600 mg in sodium chloride 0.9 % 58 mL chemo infusion, 2,400 mg/m2 = 4,600 mg, Intravenous, 1 Day/Dose, 30 of 32 cycles Administration: 4,600 mg (05/21/2015), 4,600 mg (06/05/2015), 4,600 mg (06/19/2015), 4,600 mg (07/10/2015), 4,600 mg (07/24/2015), 4,600 mg (08/28/2015), 4,600 mg (09/11/2015), 4,600 mg (09/25/2015), 4,600 mg (10/09/2015), 4,600 mg (10/23/2015), 4,600 mg (11/04/2016), 4,600 mg (11/18/2016), 4,600 mg (12/02/2016), 4,600 mg (12/15/2016), 4,300 mg (12/30/2016), 4,300 mg (01/13/2017), 4,300 mg (01/27/2017), 4,300 mg (02/17/2017), 4,300 mg (03/03/2017), 4,300 mg (03/24/2017), 4,300 mg (04/07/2017), 4,300  mg (04/21/2017), 4,300 mg (05/05/2017), 4,300 mg (08/19/2017), 4,400 mg (09/01/2017), 4,400 mg (11/03/2017), 4,400 mg (11/17/2017), 4,400 mg  (12/08/2017), 4,400 mg (12/22/2017), 4,400 mg (01/05/2018)  for chemotherapy treatment.     02/02/2018 -  Chemotherapy    The patient had palonosetron (ALOXI) injection 0.25 mg, 0.25 mg, Intravenous,  Once, 3 of 4 cycles Administration: 0.25 mg (02/02/2018), 0.25 mg (02/24/2018), 0.25 mg (03/09/2018) pegfilgrastim-cbqv (UDENYCA) injection 6 mg, 6 mg, Subcutaneous, Once, 2 of 3 cycles Administration: 6 mg (03/11/2018) leucovorin 728 mg in dextrose 5 % 250 mL infusion, 400 mg/m2 = 728 mg, Intravenous,  Once, 3 of 4 cycles Administration: 728 mg (02/02/2018), 728 mg (02/24/2018), 728 mg (03/09/2018) oxaliplatin (ELOXATIN) 150 mg in dextrose 5 % 500 mL chemo infusion, 82 mg/m2 = 155 mg, Intravenous,  Once, 3 of 4 cycles Dose modification: 60 mg/m2 (original dose 85 mg/m2, Cycle 2, Reason: Provider Judgment) Administration: 150 mg (02/02/2018), 110 mg (02/24/2018), 110 mg (03/09/2018) fluorouracil (ADRUCIL) 4,350 mg in sodium chloride 0.9 % 63 mL chemo infusion, 2,400 mg/m2 = 4,350 mg, Intravenous, 1 Day/Dose, 3 of 4 cycles Administration: 4,350 mg (02/02/2018), 4,350 mg (02/24/2018), 4,350 mg (03/09/2018)  for chemotherapy treatment.      Interim History:  STORM SOVINE presents following recent MRI brain.  Tolerating new FOLFOX chemo regimen well aside from recent illness/fever last week which delayed most recent cycle.  Had successfuly laryngoplasty last week and has been on voice rest.  Still working part time at [employer].  Medications: Current Outpatient Medications on File Prior to Visit  Medication Sig Dispense Refill  . acetaminophen (TYLENOL) 500 MG tablet Take 500-1,000 mg by mouth daily as needed for moderate pain.    Marland Kitchen docusate sodium (STOOL SOFTENER) 100 MG capsule Take 100 mg by mouth 2 (two) times daily as needed (constipation).     Marland Kitchen dronabinol (MARINOL) 5 MG capsule Take 1 capsule (5 mg total) by mouth 3 (three) times daily as needed. (Patient taking differently: Take 5 mg by  mouth 3 (three) times daily as needed (appetite). ) 90 capsule 3  . Ipratropium-Albuterol (COMBIVENT RESPIMAT) 20-100 MCG/ACT AERS respimat Inhale 1 puff into the lungs every 6 (six) hours as needed for wheezing. 4 g 0  . lamoTRIgine (LAMICTAL) 100 MG tablet Take 1 tablet (100 mg total) by mouth 2 (two) times daily. 60 tablet 3  . prochlorperazine (COMPAZINE) 10 MG tablet Take 10 mg by mouth every 6 (six) hours as needed for nausea or vomiting.    Marland Kitchen tiZANidine (ZANAFLEX) 4 MG tablet Take 0.5-1 tablets (2-4 mg total) by mouth every 8 (eight) hours as needed for muscle spasms. 30 tablet 1  . dexamethasone (DECADRON) 4 MG tablet Take at 4 mg twice a day for 5 days and then 4 mg daily for 5 days. Start on 03/10/2018. (Patient not taking: Reported on 03/13/2018) 15 tablet 0  . lidocaine-prilocaine (EMLA) cream APPLY CREAM TOPICALLY TO PORT SITE 1 HOUR PRIOR TO USE AS NEEDED (Patient not taking: Reported on 03/13/2018) 30 g 3  . lidocaine-prilocaine (EMLA) cream Apply 1 application topically as needed (prior to port access). (Patient not taking: Reported on 03/13/2018) 30 g 1  . Multiple Vitamin (MULTIVITAMIN WITH MINERALS) TABS tablet Take 1 tablet by mouth 3 (three) times a week.     No current facility-administered medications on file prior to visit.     Allergies:  Allergies  Allergen Reactions  . Alprazolam Other (See Comments)    Excessive sedation  .  Penicillins Rash    Childhood allergy Has patient had a PCN reaction causing immediate rash, facial/tongue/throat swelling, SOB or lightheadedness with hypotension: Unknown Has patient had a PCN reaction causing severe rash involving mucus membranes or skin necrosis: Unknown Has patient had a PCN reaction that required hospitalization: No Has patient had a PCN reaction occurring within the last 10 years: No If all of the above answers are "NO", then may proceed with Cephalosporin use.    Past Medical History:  Past Medical History:    Diagnosis Date  . Allergic state 01/19/2012  . Anemia   . 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 Chicken pox as a child   ?  Marland Kitchen Dysrhythmia as child   HX OF IRREGULAR HB AT TIME OF TONSILLECTOMY - SURGERY WAS NOT DONE-CAN'T REMEMBER ANY OTHER DETAILS- NEVER HAD THE SURGERY.  . Elevated liver function tests 01/19/2012  . Fatty liver   . Hernia 6 months old  . History of diarrhea    better since chemo finished  . Hx of migraines   . Lung abnormality 2015  . Lung nodule 08/2013  . Lung nodule, multiple 11/29/2013  . Migraine headache as a child  . Numbness    TOES OF BOTH FEET.  Marland Kitchen Overweight(278.02) 01/19/2012  . Pain    LEFT HEEL PAIN -SEVERE ESPECIALLY AFTER SITTING OR LYING DOWN - DIFFICULT TO GET OUT OF BED AND WALK IN THE MORNINGS BECAUSE OF HEEL PAIN  . 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 Preventative health care 01/19/2012  . Radiation 03/20/12-04/27/12   Pelvis 50.4 gray Rectal cancer  . Rectal cancer ([place]) 03/06/12   biopsy-adenocarcinoma  . Reflux 01/19/2012  . Sun-damaged skin 01/19/2012   Past Surgical History:  Past Surgical History:  Procedure Laterality Date  . CHOLECYSTECTOMY N/A 09/17/2013   Procedure: CHOLECYSTECTOMY;  Surgeon: Stark Klein, MD;  Location: [facility];  Service: General;  Laterality: N/A;  . COLOSTOMY TAKEDOWN N/A 08/17/2016   Procedure: LAPAROSCOPIC ABDOMINOPERINEAL RESECTION WITH PERMANENT COLOSTOMY;  Surgeon: Leighton Ruff, MD;  Location: [facility];  Service: General;  Laterality: N/A;  . CYST EXCISION     L EAR AREA  . EUS  03/14/2012   Procedure: LOWER ENDOSCOPIC ULTRASOUND (EUS);  Surgeon: Milus Banister, MD;  Location: [facility];  Service: Endoscopy;  Laterality: N/A;  . HERNIA REPAIR  6 months old   right inguinal repair  . ILEOSTOMY CLOSURE N/A 12/07/2012   Procedure: ILEOSTOMY TAKEDOWN;  Surgeon: Leighton Ruff, MD;  Location: [facility];   Service: General;  Laterality: N/A;  . LAPAROSCOPIC LOW ANTERIOR RESECTION N/A 06/29/2012   Procedure: LAPAROSCOPIC LOW ANTERIOR RESECTION, mobilization splenic flexure,coloanal anastomosis,diverting ileostomy;  Surgeon: Leighton Ruff, MD;  Location: [facility];  Service: General;  Laterality: N/A;  . LAPAROSCOPY N/A 09/17/2013   Procedure: LAPAROSCOPY DIAGNOSTIC;  Surgeon: Stark Klein, MD;  Location: [facility];  Service: General;  Laterality: N/A;  . LARYNGOPLASTY Left 02/10/2018   Procedure: LARYNGOPLASTY;  Surgeon: Melissa Montane, MD;  Location: [facility];  Service: ENT;  Laterality: Left;  . LIVER ULTRASOUND N/A 09/17/2013   Procedure: LIVER ULTRASOUND;  Surgeon: Stark Klein, MD;  Location: [facility];  Service: General;  Laterality: N/A;  . MICROLARYNGOSCOPY W/VOCAL CORD INJECTION Left 05/16/2017   Procedure: MICROLARYNGOSCOPY WITH VOCAL CORD INJECTION;  Surgeon: Melissa Montane, MD;  Location: [facility];  Service: ENT;  Laterality: Left;  .  OPEN HEPATECTOMY  N/A 09/17/2013   Procedure: OPEN HEPATECTOMY;  Surgeon: Stark Klein, MD;  Location: [facility];  Service: General;  Laterality: N/A;  . PORTACATH PLACEMENT Left 03/21/2013   Procedure: INSERTION PORT-A-CATH;  Surgeon: Leighton Ruff, MD;  Location: [facility];  Service: General;  Laterality: Left;  . RECTAL BIOPSY  03/06/12   distal mass1-2cm from anal verge=Adenocarcinoma  . TONSILLECTOMY     Social History:  Social History   Socioeconomic History  . Marital status: Married    Spouse name: Not on file  . Number of children: 1  . Years of education: Not on file  . Highest education level: Not on file  Occupational History  . Occupation: Midwife: [employer]    Comment: [employer]  Social Needs  . Financial resource strain: Not on file  . Food insecurity:    Worry: Not on file    Inability: Not on file  . Transportation needs:    Medical: Not on file    Non-medical: Not on file  Tobacco Use  . Smoking status: Never Smoker  .  Smokeless tobacco: Never Used  Substance and Sexual Activity  . Alcohol use: Not Currently    Alcohol/week: 0.0 standard drinks    Comment: RARE   . Drug use: No    Comment: marijuana past  . Sexual activity: Yes    Partners: Female  Lifestyle  . Physical activity:    Days per week: Not on file    Minutes per session: Not on file  . Stress: Not on file  Relationships  . Social connections:    Talks on phone: Not on file    Gets together: Not on file    Attends religious service: Not on file    Active member of club or organization: Not on file    Attends meetings of clubs or organizations: Not on file    Relationship status: Not on file  . Intimate partner violence:    Fear of current or ex partner: Not on file    Emotionally abused: Not on file    Physically abused: Not on file    Forced sexual activity: Not on file  Other Topics Concern  . Not on file  Social History Narrative   Married to wife, [name]   Has one 66 year old child   Occupation: Emergency planning/management officer at [employer]   Family History:  Family History  Adopted: Yes  Problem Relation Age of Onset  . Cancer Father   . Heart disease Father   . Colon cancer Neg Hx     Review of Systems: Constitutional: Denies fevers, chills or abnormal weight loss Eyes: Denies blurriness of vision Ears, nose, mouth, throat, and face: Denies mucositis or sore throat Respiratory: Denies cough, dyspnea or wheezes Cardiovascular: Denies palpitation, chest discomfort or lower extremity swelling Gastrointestinal:  Colostomy GU: Denies dysuria or incontinence Skin: Denies abnormal skin rashes Neurological: +headaches Musculoskeletal: +joint pain, No decrease in ROM Behavioral/Psych: mood instability   Physical Exam: Vitals:   03/13/18 1014  BP: 117/78  Pulse: 95  Resp: 18  Temp: 98.2 F (36.8 C)  SpO2: 100%   KPS: 90. General: Alert, cooperative, pleasant, in no acute distress Head: Normal EENT: No conjunctival  injection or scleral icterus. Oral mucosa moist Lungs: Resp effort normal Cardiac: Regular rate and rhythm Abdomen: Colostomy C/D/I Skin: No rashes cyanosis or petechiae. Extremities: No clubbing or edema  Neurologic Exam: Mental Status: Awake, alert, attentive to examiner.  Language is fluent with intact comprehension.  Cranial Nerves: Visual acuity is grossly normal. Right homonymous hemianopia. Extra-ocular movements intact. No ptosis. Face is symmetric, tongue midline. Motor: Tone and bulk are normal. Power is full in both arms and legs. Reflexes are symmetric, no pathologic reflexes present. Intact finger to nose bilaterally Sensory: Intact to light touch and temperature Gait: Normal and tandem gait is normal.   Labs: I have reviewed the data as listed    Component Value Date/Time   NA 142 03/09/2018 1024   NA 142 03/24/2017 1009   K 4.1 03/09/2018 1024   K 4.0 03/24/2017 1009   CL 108 03/09/2018 1024   CL 108 (H) 08/30/2012 0903   CO2 26 03/09/2018 1024   CO2 27 03/24/2017 1009   GLUCOSE 98 03/09/2018 1024   GLUCOSE 96 03/24/2017 1009   GLUCOSE 96 08/30/2012 0903   BUN 12 03/09/2018 1024   BUN 12.2 03/24/2017 1009   CREATININE 0.95 03/09/2018 1024   CREATININE 0.7 03/24/2017 1009   CALCIUM 9.0 03/09/2018 1024   CALCIUM 8.8 03/24/2017 1009   PROT 6.6 03/09/2018 1024   PROT 6.1 (L) 03/24/2017 1009   ALBUMIN 3.6 03/09/2018 1024   ALBUMIN 3.6 03/24/2017 1009   AST 17 03/09/2018 1024   AST 19 03/24/2017 1009   ALT 12 03/09/2018 1024   ALT 14 03/24/2017 1009   ALKPHOS 125 03/09/2018 1024   ALKPHOS 67 03/24/2017 1009   BILITOT 0.4 03/09/2018 1024   BILITOT <0.22 03/24/2017 1009   GFRNONAA >60 03/09/2018 1024   GFRAA >60 03/09/2018 1024   Lab Results  Component Value Date   WBC 2.0 (L) 03/09/2018   NEUTROABS 1.3 (L) 03/09/2018   HGB 9.0 (L) 03/09/2018   HCT 29.8 (L) 03/09/2018   MCV 85.4 03/09/2018   PLT 84 (L) 03/09/2018    Assessment/Plan 1. Brain  metastasis ([place])  2. Focal seizures [facility])  Mr. Lanni presents with a right field hemianopia which correlates to changes seen in the left occipital lobe on last month's MRI brain, particularly on T2 sequences.    It is likely that these changes represent inflammatory response to prior radiosurgery, and that they have progressed in the interval. In the meantime he has undergone additional radiosurgery to new metastatic focus in the left frontal lobe.    We recommended treating this process with oral dexamethasone- 4mg  BID for 5 days, followed by 4mg  daily thereafter.  He will call us in 7-10 days to describe response to this intervention.  We could additionally consider resuming Avastin infusions as second line therapy, given it is potentially part of his colorectal cancer treatment plan.  This can be discussed with Dr. Benay Spice if the need arises based on response to steroids and side effects.  He should continue Lamictal to 100mg  BID for seizures.  We appreciate the opportunity to participate in the care of Jacob [name].  He should reach out to [place] via phone up update on response to steroids.  All questions were answered. The patient knows to call the clinic with any problems, questions or concerns. No barriers to learning were detected.  The total time spent in the encounter was 25 minutes and more than 50% was on counseling and review of test results   Ventura Sellers, MD Medical Director of Neuro-[facility]cology [facility] at [place] 03/13/18 3:31 PM

## 2018-03-16 ENCOUNTER — Telehealth: Payer: Self-pay

## 2018-03-16 NOTE — Telephone Encounter (Signed)
Per 12/23 no los

## 2018-03-21 ENCOUNTER — Other Ambulatory Visit: Payer: Self-pay | Admitting: Internal Medicine

## 2018-03-21 ENCOUNTER — Telehealth: Payer: Self-pay | Admitting: *Deleted

## 2018-03-21 DIAGNOSIS — C787 Secondary malignant neoplasm of liver and intrahepatic bile duct: Principal | ICD-10-CM

## 2018-03-21 DIAGNOSIS — C20 Malignant neoplasm of rectum: Secondary | ICD-10-CM

## 2018-03-21 MED ORDER — DEXAMETHASONE 2 MG PO TABS: 2.0000 mg | ORAL_TABLET | Freq: Every day | ORAL | 2 refills | Status: DC

## 2018-03-21 NOTE — Telephone Encounter (Signed)
Patient left message regarding steroid effectiveness.  He reports that he has seen improvement with this vision and headaches with the use of the steroid and wanted to know if he should continue to dose with the steroid.  Dr. Mickeal Skinner reordered Decadron with lower dose.  Patient is aware and denies any questions of new dose of Decadron 2 mg daily.

## 2018-03-26 ENCOUNTER — Other Ambulatory Visit: Payer: Self-pay | Admitting: Oncology

## 2018-03-27 ENCOUNTER — Inpatient Hospital Stay: Payer: BC Managed Care – PPO | Attending: Oncology | Admitting: Oncology

## 2018-03-27 ENCOUNTER — Ambulatory Visit: Payer: BC Managed Care – PPO | Admitting: Family Medicine

## 2018-03-27 ENCOUNTER — Inpatient Hospital Stay: Payer: BC Managed Care – PPO

## 2018-03-27 VITALS — BP 124/88 | HR 84 | Temp 97.9°F | Resp 18 | Ht 70.0 in | Wt 146.3 lb

## 2018-03-27 VITALS — BP 120/76 | HR 78 | Temp 97.4°F | Resp 18 | Wt 149.2 lb

## 2018-03-27 DIAGNOSIS — C7800 Secondary malignant neoplasm of unspecified lung: Secondary | ICD-10-CM

## 2018-03-27 DIAGNOSIS — K5900 Constipation, unspecified: Secondary | ICD-10-CM

## 2018-03-27 DIAGNOSIS — D649 Anemia, unspecified: Secondary | ICD-10-CM

## 2018-03-27 DIAGNOSIS — R911 Solitary pulmonary nodule: Secondary | ICD-10-CM

## 2018-03-27 DIAGNOSIS — Z5189 Encounter for other specified aftercare: Secondary | ICD-10-CM | POA: Insufficient documentation

## 2018-03-27 DIAGNOSIS — C787 Secondary malignant neoplasm of liver and intrahepatic bile duct: Secondary | ICD-10-CM

## 2018-03-27 DIAGNOSIS — Z95828 Presence of other vascular implants and grafts: Secondary | ICD-10-CM

## 2018-03-27 DIAGNOSIS — C20 Malignant neoplasm of rectum: Secondary | ICD-10-CM

## 2018-03-27 DIAGNOSIS — Z5111 Encounter for antineoplastic chemotherapy: Secondary | ICD-10-CM | POA: Diagnosis present

## 2018-03-27 DIAGNOSIS — R0600 Dyspnea, unspecified: Secondary | ICD-10-CM | POA: Diagnosis not present

## 2018-03-27 DIAGNOSIS — D696 Thrombocytopenia, unspecified: Secondary | ICD-10-CM | POA: Diagnosis not present

## 2018-03-27 DIAGNOSIS — J9819 Other pulmonary collapse: Secondary | ICD-10-CM | POA: Insufficient documentation

## 2018-03-27 DIAGNOSIS — C7931 Secondary malignant neoplasm of brain: Secondary | ICD-10-CM | POA: Insufficient documentation

## 2018-03-27 DIAGNOSIS — H539 Unspecified visual disturbance: Secondary | ICD-10-CM

## 2018-03-27 DIAGNOSIS — R0602 Shortness of breath: Secondary | ICD-10-CM | POA: Diagnosis not present

## 2018-03-27 DIAGNOSIS — R569 Unspecified convulsions: Secondary | ICD-10-CM | POA: Diagnosis not present

## 2018-03-27 DIAGNOSIS — H547 Unspecified visual loss: Secondary | ICD-10-CM

## 2018-03-27 DIAGNOSIS — R05 Cough: Secondary | ICD-10-CM

## 2018-03-27 DIAGNOSIS — R062 Wheezing: Secondary | ICD-10-CM

## 2018-03-27 LAB — CMP (CANCER CENTER ONLY)
ALT: 32 U/L (ref 0–44)
AST: 20 U/L (ref 15–41)
Albumin: 3.8 g/dL (ref 3.5–5.0)
Alkaline Phosphatase: 133 U/L — ABNORMAL HIGH (ref 38–126)
Anion gap: 11 (ref 5–15)
BUN: 14 mg/dL (ref 6–20)
CO2: 21 mmol/L — ABNORMAL LOW (ref 22–32)
Calcium: 9.4 mg/dL (ref 8.9–10.3)
Chloride: 106 mmol/L (ref 98–111)
Creatinine: 0.92 mg/dL (ref 0.61–1.24)
GFR, Est AFR Am: >60 mL/min (ref >60)
GFR, Estimated: >60 mL/min (ref >60)
Glucose, Bld: 101 mg/dL — ABNORMAL HIGH (ref 70–99)
Potassium: 3.9 mmol/L (ref 3.5–5.1)
Sodium: 138 mmol/L (ref 135–145)
Total Bilirubin: 0.4 mg/dL (ref 0.3–1.2)
Total Protein: 7.1 g/dL (ref 6.5–8.1)

## 2018-03-27 LAB — CBC WITH DIFFERENTIAL (CANCER CENTER ONLY)
Abs Immature Granulocytes: 0.04 10*3/uL (ref 0.00–0.07)
Basophils Absolute: 0 10*3/uL (ref 0.0–0.1)
Basophils Relative: 0 %
Eosinophils Absolute: 0 10*3/uL (ref 0.0–0.5)
Eosinophils Relative: 0 %
HCT: 34.6 % — ABNORMAL LOW (ref 39.0–52.0)
Hemoglobin: 10.7 g/dL — ABNORMAL LOW (ref 13.0–17.0)
Immature Granulocytes: 1 %
Lymphocytes Relative: 7 %
Lymphs Abs: 0.5 10*3/uL — ABNORMAL LOW (ref 0.7–4.0)
MCH: 26.3 pg (ref 26.0–34.0)
MCHC: 30.9 g/dL (ref 30.0–36.0)
MCV: 85 fL (ref 80.0–100.0)
Monocytes Absolute: 0.8 10*3/uL (ref 0.1–1.0)
Monocytes Relative: 11 %
Neutro Abs: 5.7 10*3/uL (ref 1.7–7.7)
Neutrophils Relative %: 81 %
Platelet Count: 96 10*3/uL — ABNORMAL LOW (ref 150–400)
RBC: 4.07 MIL/uL — ABNORMAL LOW (ref 4.22–5.81)
RDW: 20.3 % — ABNORMAL HIGH (ref 11.5–15.5)
WBC Count: 7.1 10*3/uL (ref 4.0–10.5)
nRBC: 0 % (ref 0.0–0.2)

## 2018-03-27 MED ORDER — SODIUM CHLORIDE 0.9% FLUSH
10.0000 mL | INTRAVENOUS | Status: DC | PRN
  Filled 2018-03-27: qty 10

## 2018-03-27 MED ORDER — FLUTICASONE PROPIONATE HFA 110 MCG/ACT IN AERO: 2.0000 | INHALATION_SPRAY | Freq: Two times a day (BID) | RESPIRATORY_TRACT | 12 refills | Status: AC

## 2018-03-27 MED ORDER — HEPARIN SOD (PORK) LOCK FLUSH 100 UNIT/ML IV SOLN
500.0000 [IU] | Freq: Once | INTRAVENOUS | Status: DC | PRN
  Filled 2018-03-27: qty 5

## 2018-03-27 NOTE — Progress Notes (Signed)
[facility] OFFICE PROGRESS NOTE   Diagnosis: Rectal cancer  INTERVAL HISTORY:   Jacob [name] returns as scheduled.  He completed another cycle of FOLFOX on 03/09/2018.  He reports tolerating the chemotherapy well.  No peripheral numbness.  No nausea/vomiting or diarrhea.  He continues to have dyspnea.  He reports minimal blood in his sputum.  Visual symptoms improved when he was placed on Decadron.  He is completing a Decadron taper.  Objective:  Vital signs in last 24 hours:  Blood pressure 124/88, pulse 84, temperature 97.9 F (36.6 C), temperature source Oral, resp. rate 18, height 5' 10" (1.778 m), weight 146 lb 4.8 oz (66.4 kg), SpO2 99 %.    HEENT: No ulcers Resp: Bilateral inspiratory and expiratory wheezing, decreased breath sounds in the left compared to the right chest, no respiratory distress Cardio: Regular rate and rhythm GI: No hepatomegaly, nontender left lower quadrant colostomy Vascular: No leg edema  Skin: 0.5 cm oval to tenuous nodule at the left lateral chest wall  Portacath/PICC-without erythema  Lab Results:  Lab Results  Component Value Date   WBC 7.1 03/27/2018   HGB 10.7 (L) 03/27/2018   HCT 34.6 (L) 03/27/2018   MCV 85.0 03/27/2018   PLT 96 (L) 03/27/2018   NEUTROABS 5.7 03/27/2018    CMP  Lab Results  Component Value Date   NA 138 03/27/2018   K 3.9 03/27/2018   CL 106 03/27/2018   CO2 21 (L) 03/27/2018   GLUCOSE 101 (H) 03/27/2018   BUN 14 03/27/2018   CREATININE 0.92 03/27/2018   CALCIUM 9.4 03/27/2018   PROT 7.1 03/27/2018   ALBUMIN 3.8 03/27/2018   AST 20 03/27/2018   ALT 32 03/27/2018   ALKPHOS 133 (H) 03/27/2018   BILITOT 0.4 03/27/2018   GFRNONAA >60 03/27/2018   GFRAA >60 03/27/2018    Lab Results  Component Value Date   CEA1 78.58 (H) 03/09/2018    Medications: I have reviewed the patient's current medications.   Assessment/Plan: 1. Rectal cancer. Partially obstructing mass noted 1-2 cm from the  anal verge on a colonoscopy 03/06/2012. Endoscopic ultrasound 03/14/2012 with a 7.5 mm thick, 3.2 cm wide hypoechoic, irregularly bordered mass that clearly passed into and through the muscularis propria layer of the distal rectal wall (uT3); 3 small (largest 7 mm) perirectal lymph nodes. The lymph nodes were all round, discrete, hypoechoic, homogenous; suspicious for malignant involvement (uN1).  No RAS mutation identified by [facility] 1 testing on the colon resection specimen 06/29/2012, APC alteration identified, microsatellite stable  He began radiation and concurrent Xeloda chemotherapy on 03/20/2012, completed 04/27/2012.   Low anterior resection/coloanal anastomosis and diverting ileostomy 06/29/2012 with the final pathology revealing a T2N0 tumor with extensive fibrosis and negative margins.   Cycle 1 of adjuvant CAPOX 07/19/2012. Cycle 5 of adjuvant CAPOX 10/11/2012.   CEA 2.5 on 11/14/2012.   CEA 14.6 03/08/2013.   Restaging CT evaluation 03/08/2013 with a 4 mm pulmonary nodule left lung base not identified on comparison exam; new liver lesions including a 29 x 26 mm irregular peripheral enhancing rounded lesion in the dome of the right hepatic lobe, a less well-defined new subcapsular lesion in the lateral right hepatic lobe measuring 12 mm, a new subcapsular lesion in the anterior right hepatic lobe adjacent to the gallbladder fossa measuring 10 mm; a rounded low-density lesion in the inferior right hepatic lobe measuring 10 mm compared to 7 mm on the prior study (radiologist commented this may represent an enlarged   cyst).   MRI of the abdomen 04/12/2013 confirmed multiple T2 hyperintense metastatic lesions throughout the right liver   Initiation of FOLFIRI/Avastin with genotype based irinotecan dosing per the [facility] study 04/05/2013.   Restaging CT evaluation on 06/20/2013 (after 2 cycles/4 treatments) showed improvement in the liver metastases and stable size of a left lower  lobe pulmonary nodule now with central cavitation.   Continuation of FOLFIRI/Avastin.   Restaging CT evaluation 08/14/2013-decrease in the size of liver metastases, slight decrease in the size of a cavitary left lower lobe nodule, no evidence of disease progression.   Status post right hepatic lobectomy 09/17/2013. Pathology showed multiple foci of metastatic adenocarcinoma (5 nodules of metastatic adenocarcinoma 4 of which are subcapsular with the nodules ranging in size from 0.6-1.8 cm in greatest dimension). Margins not involved. Biopsy of a portal lymph node showed benign adipose tissue; no lymph node tissue or malignancy.   Normal CEA 11/06/2013   CT 11/06/2013 with a new pleural-based right lower chest lesion, slight in enlargement of the a left-sided lung nodule  CT 01/29/2014 with a decrease in the right lower chest pleural-based lesion and a slight increase of a left-sided lung lesion, other lung lesions were stable  CT chest 05/02/2014 with a decrease in the size of a right lower lobe pulmonary nodule months similar size of a dominant left lower lobe nodule, minimal enlargement of a smaller left lower lobe nodule, no new site of disease  CT chest 11/07/2014 with a slight increase in several left-sided lung nodules  CT chest 02/10/2015 with new small left hilar lymph nodes, a possible new left lower lobe nodule, and stability of other lung nodules  PET scan 03/12/2015 with hypermetabolic left hilar nodes, hypermetabolic left lower lobe nodules, hyper metabolic retroperitoneal nodes, intense hypermetabolism at the coloanal anastomosis, and hypermetabolic thickening in the presacral space  Cycle 1 FOLFIRI/PANITUMUMAB 05/21/2015  Cycle 2 FOLFIRI/PANITUMUMAB 06/05/2015  Cycle 3 FOLFIRI/PANITUMUMAB 06/19/2015  Cycle 4 FOLFIRI 07/10/2015  Cycle 5 FOLFIRI 07/24/2015  Restaging CTs 08/06/2015-resolution of hilar/retroperitoneal adenopathy, improvement in the hypermetabolic lung  nodule, other lung nodules are stable, no new lesions  Cycle 6 FOLFIRI with PANITUMUMAB 08/28/2015 -panitumumab dose reduced  Cycle 7 FOLFIRI 09/11/2015-no panitumumab given  Cycle 8 FOLFIRI with PANITUMUMAB 09/25/2015-PANITUMUMAB dose reduced  Cycle 9 FOLFIRI 10/09/2015-no PANITUMUMAB given  Cycle 10 FOLFIRI with panitumumab 10/23/2015  CTs 11/06/2015-possible slight enlargement of a left lower lobe nodule, no other evidence of disease progression.  CTs 02/16/2016-enlargement of left-sided pulmonary nodules and mediastinal/left hilar nodes, improved splenomegaly  CTs 06/21/2016-increased size of mediastinal/left hilar nodes, increased left lung nodules, and increased soft tissue at the porta hepatis  CTs 10/28/2016-progressive disease in the chest and abdomen with an enlarging left hilar mass and slight interval enlargement of pulmonary lesions. Right upper quadrant necrotic nodal mass significantly increased in size with mass effect on the liver and possible invasion. Significant compression of the intrahepatic IVC. New right hepatic lobe lesion. Moderate pelvic ascites.  Cycle 1 FOLFIRI/PANITUMUMAB 11/04/2016  Cycle 2 FOLFIRI/PANITUMUMAB 11/18/2016  Cycle 3 FOLFIRI/panitumumab 12/02/2016  Cycle4 FOLFIRI/PANITUMUMAB 12/15/2016  Cycle 5 FOLFIRI/PANITUMUMAB 12/30/2016  CTs 01/06/2017-no evidence of progressive disease, decreased chest adenopathy, stable left lower lobe nodules, decreased liver metastasis, decreased right retroperitoneal mass  Cycle 6 FOLFIRI/panitumumab 01/13/2017  Cycle 7 FOLFIRI/Panitumumab 01/27/2017  Cycle 8 FOLFIRI/panitumumab 02/17/2017  Cycle 9 FOLFIRI/Panitumumab 03/03/2017  Cycle 10 FOLFIRI/Panitumumab 03/24/2017  CTs 04/06/2017-enlargement of right adrenal mass (smaller than October 2019), stable left hilar fullness and lung nodules  Cycle  11 FOLFIRI/Panitumumab 04/07/2017  Cycle 12 FOLFIRI/panitumumab 04/21/2017  Cycle 13 FOLFIRI/Panitumumab  05/05/2017  Palliative radiation to the right adrenal mass 226 2019-3 02/2018  Brain MRI 07/28/2017-4intracranial metastasis in the right frontal lobe, left parietal lobe and left occipital lobe. Surrounding edema pronounced in the left occipital lobe.  SBRT to for brain lesions on 08/12/2017  CTs 08/10/2017- new left upper lobe airspace process, stable lung nodules and medius and lymphadenopathy, decreased mass involving the adrenal gland with progression of a necrotic anterior portion of the mass, no liver lesions  Cycle 14 FOLFIRI/panitumumab 08/19/2017, irinotecan dose reduced secondary to thrombocytopenia, Panitumumab dose escalated  Cycle 15 FOLFIRI/panitumumab 09/01/2017) Panitumumab dose reduced secondary to an early rash following cycle 14  CT chest 10/24/2017- progression of disease in the left lung; mild progression of left hilar nodal disease with new right hilar lymphadenopathy and progressive increase in lymph nodes in the mediastinum; interval progression of caudate lobe metastatic lesion.  Cycle 1 FOLFIRI/Panitumumab 11/03/2017  Cycle 2 FOLFIRI/Panitumumab 11/17/2017  Cycle 3 FOLFIRI/panitumumab 12/08/2017 (Panitumumab dose increased)  Cycle 4 FOLFIRI/Panitumumab 12/22/2017  Cycle 5 FOLFIRI/Panitumumab 01/05/2018  CTs 01/17/2018- new 4 cm posterior liver mass. Mild growth of separate heterogeneous 5.9 cm mass posterior margin of the liver near the IVC. Newbandlike low-attenuation focus in the posterior inferior liver, indeterminate, favoring expansile right portal vein thrombus. Stable right adrenal metastasis. Mixed response in the chest. Left hilar/AP window adenopathy mildly increased. Right paratracheal adenopathy mildly increased. Right hilar adenopathy decreased. Left lower lobe pulmonary nodule stable to mildly decreased. Tiny right upper lobe pulmonary nodule slightly increased. New 2.7 cm sub-solid pulmonary nodule basilar right lower lobe. Waxing and waning left  upper lobe nodular foci of consolidation. New mild splenomegaly.  Cycle 1 FOLFOX 02/02/2018  Brain MRI 02/13/2018-new left frontal parietal lesion, mild progression of enhancing lesion in the left occipital lobe-previously treated lesion  Cycle 2 FOLFOX 02/24/2018  SRS to 1 brain lesion 03/02/2018  Cycle 3 FOLFOX 03/09/2018  Cycle 4 FOLFOX 03/28/2018 2. History ofIrregular bowel habits/rectal bleeding secondary to #1. 3. History of Mild elevation of the liver enzymes. Question secondary to hepatic steatosis. 4. Indeterminate 8 mm posterior right liver lesion on the staging CT 03/06/2012. 5. Mildly elevated CEA at 5.7 on 03/06/2012. 6. History of radiation erythema at the groin and perineum 7. 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 No clinical evidence of thrombophlebitis or extravasation. 8. Delayed nausea following chemotherapy-Decadron prophylaxis was added with cycle 3 CAPOX-improved. 9. Ileostomy takedown 12/07/2012. 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 11. Port-A-Cath placement 12/31/204 12. Severe neutropenia secondary to chemotherapy following cycle 1 of FOLFIRIin 2015, chemotherapy was dose reduced and he received Neulasta with day 15 cycle 1  13. Nausea and vomiting following cycle 1 of FOLFIRI 14. Rectal stricture-manual/colonoscopic dilatation by Dr. Ardis Hughs 03/01/2016  APR 08/17/2016-no evidence of malignancy 15. History ofLeukopenia/Thrombocytopenia-persistent, potentially a sequelae of chemotherapy or hepatic toxicity from chemotherapy/radiation. Bone marrow biopsy 11/20/2014 showed cellular bone marrow with trilineage hematopoiesis. Significant dyspoiesis was not present and there was no evidence of metastatic carcinoma. Cytogenetic analysis showed the presence of normal male chromosomes with no observable clonal chromosomal abnormalities.  probable cirrhosis 16. Genetic  testing-negative genetic panel in March 2014 17. 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 Severe over the face-steroid Dosepak prescribed 07/03/2015. Improved 07/10/2015. Further improved 07/24/2015, 08/07/2015, 08/28/2015 18. Diarrhea secondary to chemotherapy-encouraged to use Imodium  19. History of Paronychia secondary to PANITUMUMAB 20. 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 History of neutropenia, thrombocytopenia 06/23/2017. Possibly related to radiation. He also has a history of suspected portal hypertension secondaryto toxicity from chemotherap 22. Seizure in the setting of brain metastases 08/05/2017, now maintained onLamictal 23. Altered mental status evaluated by neuro-oncology, Keppra discontinued, improved    Disposition: Jacob [name] appears stable.  He is completing a Decadron taper for treatment of right visual field symptoms.  The symptoms have improved.  He will complete another cycle of FOLFOX on 03/28/2018.  The plan is to complete 5 cycles of salvage FOLFOX therapy prior to a restaging CT evaluation.  He continues to have dyspnea, wheezing, and a cough secondary to the tumor burden in the chest.  He will contact us for increased dyspnea.  Jacob [name] will return for an office visit and cycle 5 chemotherapy in 2 weeks.  We will check the CEA when he returns in 2 weeks.  Betsy Coder, MD  03/27/2018  2:23 PM

## 2018-03-27 NOTE — Progress Notes (Signed)
Pt wanted labs drawn peripherally no port access today

## 2018-03-27 NOTE — Patient Instructions (Addendum)
Encouraged increased hydration and fiber in diet. Daily probiotics. If bowels not moving can use MOM 2 tbls (or 2 tabs daily with prunes) po in 4 oz of warm prune juice by mouth every 2-3 days. If no results then repeat in 4 hours with  Dulcolax suppository pr, may repeat again in 4 more hours as needed. Seek care if symptoms worsen. Consider daily Miralax and/or Dulcolax if symptoms persist.  shingrix is the new shingles shot, 2 shots over 2-6 months at pharmacy.   Shortness of Breath, Adult Shortness of breath is when a person has trouble breathing enough air or when a person feels like she or he is having trouble breathing in enough air. Shortness of breath could be a sign of a medical problem. Follow these instructions at home:   Pay attention to any changes in your symptoms.  Do not use any products that contain nicotine or tobacco, such as cigarettes, e-cigarettes, and chewing tobacco.  Do not smoke. Smoking is a common cause of shortness of breath. If you need help quitting, ask your health care provider.  Avoid things that can irritate your airways, such as: ? Mold. ? Dust. ? Air pollution. ? Chemical fumes. ? Things that can cause allergy symptoms (allergens), if you have allergies.  Keep your living space clean and free of mold and dust.  Rest as needed. Slowly return to your usual activities.  Take over-the-counter and prescription medicines only as told by your health care provider. This includes oxygen therapy and inhaled medicines.  Keep all follow-up visits as told by your health care provider. This is important. Contact a health care provider if:  Your condition does not improve as soon as expected.  You have a hard time doing your normal activities, even after you rest.  You have new symptoms. Get help right away if:  Your shortness of breath gets worse.  You have shortness of breath when you are resting.  You feel light-headed or you faint.  You have a  cough that is not controlled with medicines.  You cough up blood.  You have pain with breathing.  You have pain in your chest, arms, shoulders, or abdomen.  You have a fever.  You cannot walk up stairs or exercise the way that you normally do. These symptoms may represent a serious problem that is an emergency. Do not wait to see if the symptoms will go away. Get medical help right away. Call your local emergency services (911 in the [place] Do not drive yourself to the hospital. Summary  Shortness of breath is when a person has trouble breathing enough air. It can be a sign of a medical problem.  Avoid things that irritate your lungs, such as smoking, pollution, mold, and dust.  Pay attention to changes in your symptoms and contact your health care provider if you have a hard time completing daily activities because of shortness of breath. This information is not intended to replace advice given to you by your health care provider. Make sure you discuss any questions you have with your health care provider. Document Released: 12/01/2000 Document Revised: 08/08/2017 Document Reviewed: 08/08/2017 Elsevier Interactive Patient Education  2019 [employer].

## 2018-03-28 ENCOUNTER — Telehealth: Payer: Self-pay | Admitting: Oncology

## 2018-03-28 ENCOUNTER — Inpatient Hospital Stay: Payer: BC Managed Care – PPO

## 2018-03-28 VITALS — BP 113/74 | HR 99 | Temp 97.9°F | Resp 18

## 2018-03-28 DIAGNOSIS — C787 Secondary malignant neoplasm of liver and intrahepatic bile duct: Principal | ICD-10-CM

## 2018-03-28 DIAGNOSIS — Z5111 Encounter for antineoplastic chemotherapy: Secondary | ICD-10-CM | POA: Diagnosis not present

## 2018-03-28 DIAGNOSIS — C20 Malignant neoplasm of rectum: Secondary | ICD-10-CM

## 2018-03-28 MED ORDER — PALONOSETRON HCL INJECTION 0.25 MG/5ML
0.2500 mg | Freq: Once | INTRAVENOUS | Status: AC
  Administered 2018-03-28: 0.25 mg via INTRAVENOUS

## 2018-03-28 MED ORDER — DEXAMETHASONE SODIUM PHOSPHATE 10 MG/ML IJ SOLN
INTRAMUSCULAR | Status: AC
  Filled 2018-03-28: qty 1

## 2018-03-28 MED ORDER — DEXAMETHASONE SODIUM PHOSPHATE 10 MG/ML IJ SOLN
10.0000 mg | Freq: Once | INTRAMUSCULAR | Status: AC
  Administered 2018-03-28: 10 mg via INTRAVENOUS

## 2018-03-28 MED ORDER — LEUCOVORIN CALCIUM INJECTION 350 MG
400.0000 mg/m2 | Freq: Once | INTRAVENOUS | Status: AC
  Administered 2018-03-28: 728 mg via INTRAVENOUS
  Filled 2018-03-28: qty 36.4

## 2018-03-28 MED ORDER — OXALIPLATIN CHEMO INJECTION 100 MG/20ML
60.0000 mg/m2 | Freq: Once | INTRAVENOUS | Status: AC
  Administered 2018-03-28: 110 mg via INTRAVENOUS
  Filled 2018-03-28: qty 20

## 2018-03-28 MED ORDER — SODIUM CHLORIDE 0.9 % IV SOLN
2400.0000 mg/m2 | INTRAVENOUS | Status: DC
  Administered 2018-03-28: 4350 mg via INTRAVENOUS
  Filled 2018-03-28: qty 87

## 2018-03-28 MED ORDER — DEXTROSE 5 % IV SOLN
Freq: Once | INTRAVENOUS | Status: AC
  Administered 2018-03-28: 09:00:00 via INTRAVENOUS
  Filled 2018-03-28: qty 250

## 2018-03-28 MED ORDER — PALONOSETRON HCL INJECTION 0.25 MG/5ML
INTRAVENOUS | Status: AC
  Filled 2018-03-28: qty 5

## 2018-03-28 NOTE — Telephone Encounter (Signed)
Scheduled appt per 1/6 los - pt to get an updated schedule in the treatment area.

## 2018-03-28 NOTE — Progress Notes (Signed)
Ok to treat with lab work from 03/27/2018 per MD Benay Spice

## 2018-03-28 NOTE — Telephone Encounter (Signed)
Error opening  

## 2018-03-28 NOTE — Patient Instructions (Addendum)
[facility] Discharge Instructions for Patients Receiving Chemotherapy  Today you received the following chemotherapy agents Oxaliplatin, Leucovorin and Adrucil   To help prevent nausea and vomiting after your treatment, we encourage you to take your nausea medication as directed. No Zofran for 3 days, take Compazine instead.   If you develop nausea and vomiting that is not controlled by your nausea medication, call the clinic.   BELOW ARE SYMPTOMS THAT SHOULD BE REPORTED IMMEDIATELY:  *FEVER GREATER THAN 100.5 F  *CHILLS WITH OR WITHOUT FEVER  NAUSEA AND VOMITING THAT IS NOT CONTROLLED WITH YOUR NAUSEA MEDICATION  *UNUSUAL SHORTNESS OF BREATH  *UNUSUAL BRUISING OR BLEEDING  TENDERNESS IN MOUTH AND THROAT WITH OR WITHOUT PRESENCE OF ULCERS  *URINARY PROBLEMS  *BOWEL PROBLEMS  UNUSUAL RASH Items with * indicate a potential emergency and should be followed up as soon as possible.  Feel free to call the clinic should you have any questions or concerns. The clinic phone number is [phone].  Please show the [place] at check-in to the Emergency Department and triage nurse.

## 2018-03-29 DIAGNOSIS — H547 Unspecified visual loss: Secondary | ICD-10-CM | POA: Insufficient documentation

## 2018-03-29 NOTE — Assessment & Plan Note (Signed)
Encouraged increased hydration and fiber in diet. Daily probiotics. If bowels not moving can use MOM 2 tbls po in 4 oz of warm prune juice by mouth every 2-3 days.

## 2018-03-29 NOTE — Assessment & Plan Note (Signed)
He had an episode of visual loss due to brain met but with treatment it is improving.

## 2018-03-29 NOTE — Assessment & Plan Note (Signed)
Continues to tolerate his treatments and reports less bleeding episodes recently

## 2018-03-29 NOTE — Assessment & Plan Note (Signed)
Increase leafy greens, consider increased lean red meat and using cast iron cookware. Continue to monitor, report any concerns 

## 2018-03-29 NOTE — Progress Notes (Signed)
Subjective:    Patient ID: Jacob [name], male    DOB: 01/10/1971, 48 y.o.   MRN: [id number]  No chief complaint on file.   HPI Patient is in today for follow up. 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 He is receiving treatment and his vision is improving. He continues to struggle with fatigue and myalgias. No recent febrile illness or hospitalizations. Denies CP/palp/SOB/HA/congestion/fevers or GU c/o. Taking meds as prescribed  Past Medical History:  Diagnosis Date  . Allergic state 01/19/2012  . Anemia   . 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 Chicken pox as a child   ?  Marland Kitchen Dysrhythmia as child   HX OF IRREGULAR HB AT TIME OF TONSILLECTOMY - SURGERY WAS NOT DONE-CAN'T REMEMBER ANY OTHER DETAILS- NEVER HAD THE SURGERY.  . Elevated liver function tests 01/19/2012  . Fatty liver   . Hernia 6 months old  . History of diarrhea    better since chemo finished  . Hx of migraines   . Lung abnormality 2015  . Lung nodule 08/2013  . Lung nodule, multiple 11/29/2013  . Migraine headache as a child  . Numbness    TOES OF BOTH FEET.  Marland Kitchen Overweight(278.02) 01/19/2012  . Pain    LEFT HEEL PAIN -SEVERE ESPECIALLY AFTER SITTING OR LYING DOWN - DIFFICULT TO GET OUT OF BED AND WALK IN THE MORNINGS BECAUSE OF HEEL PAIN  . 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 Preventative health care 01/19/2012  . Radiation 03/20/12-04/27/12   Pelvis 50.4 gray Rectal cancer  . Rectal cancer ([place]) 03/06/12   biopsy-adenocarcinoma  . Reflux 01/19/2012  . Sun-damaged skin 01/19/2012    Past Surgical History:  Procedure Laterality Date  . CHOLECYSTECTOMY N/A 09/17/2013   Procedure: CHOLECYSTECTOMY;  Surgeon: Stark Klein, MD;  Location: [place];  Service: General;  Laterality: N/A;  . COLOSTOMY TAKEDOWN N/A 08/17/2016   Procedure: LAPAROSCOPIC ABDOMINOPERINEAL RESECTION WITH  PERMANENT COLOSTOMY;  Surgeon: Leighton Ruff, MD;  Location: [facility];  Service: General;  Laterality: N/A;  . CYST EXCISION     L EAR AREA  . EUS  03/14/2012   Procedure: LOWER ENDOSCOPIC ULTRASOUND (EUS);  Surgeon: Milus Banister, MD;  Location: [facility];  Service: Endoscopy;  Laterality: N/A;  . HERNIA REPAIR  6 months old   right inguinal repair  . ILEOSTOMY CLOSURE N/A 12/07/2012   Procedure: ILEOSTOMY TAKEDOWN;  Surgeon: Leighton Ruff, MD;  Location: [facility];  Service: General;  Laterality: N/A;  . LAPAROSCOPIC LOW ANTERIOR RESECTION N/A 06/29/2012   Procedure: LAPAROSCOPIC LOW ANTERIOR RESECTION, mobilization splenic flexure,coloanal anastomosis,diverting ileostomy;  Surgeon: Leighton Ruff, MD;  Location: [facility];  Service: General;  Laterality: N/A;  . LAPAROSCOPY N/A 09/17/2013   Procedure: LAPAROSCOPY DIAGNOSTIC;  Surgeon: Stark Klein, MD;  Location: [place];  Service: General;  Laterality: N/A;  . LARYNGOPLASTY Left 02/10/2018   Procedure: LARYNGOPLASTY;  Surgeon: Melissa Montane, MD;  Location: [place];  Service: ENT;  Laterality: Left;  . LIVER ULTRASOUND N/A 09/17/2013   Procedure: LIVER ULTRASOUND;  Surgeon: Stark Klein, MD;  Location: [place];  Service: General;  Laterality: N/A;  . MICROLARYNGOSCOPY W/VOCAL CORD INJECTION Left 05/16/2017   Procedure: MICROLARYNGOSCOPY WITH VOCAL CORD INJECTION;  Surgeon: Melissa Montane, MD;  Location: [place];  Service: ENT;  Laterality: Left;  . OPEN  HEPATECTOMY  N/A 09/17/2013   Procedure: OPEN HEPATECTOMY;  Surgeon: Stark Klein, MD;  Location: [place];  Service: General;  Laterality: N/A;  . PORTACATH PLACEMENT Left 03/21/2013   Procedure: INSERTION PORT-A-CATH;  Surgeon: Leighton Ruff, MD;  Location: [facility];  Service: General;  Laterality: Left;  . RECTAL BIOPSY  03/06/12   distal mass1-2cm from anal verge=Adenocarcinoma  . TONSILLECTOMY      Family History  Adopted: Yes  Problem Relation Age of Onset  . Cancer Father   . Heart  disease Father   . Colon cancer Neg Hx     Social History   Socioeconomic History  . Marital status: Married    Spouse name: Not on file  . Number of children: 1  . Years of education: Not on file  . Highest education level: Not on file  Occupational History  . Occupation: Midwife: [employer]    Comment: [employer]  Social Needs  . Financial resource strain: Not on file  . Food insecurity:    Worry: Not on file    Inability: Not on file  . Transportation needs:    Medical: Not on file    Non-medical: Not on file  Tobacco Use  . Smoking status: Never Smoker  . Smokeless tobacco: Never Used  Substance and Sexual Activity  . Alcohol use: Not Currently    Alcohol/week: 0.0 standard drinks    Comment: RARE   . Drug use: No    Comment: marijuana past  . Sexual activity: Yes    Partners: Female  Lifestyle  . Physical activity:    Days per week: Not on file    Minutes per session: Not on file  . Stress: Not on file  Relationships  . Social connections:    Talks on phone: Not on file    Gets together: Not on file    Attends religious service: Not on file    Active member of club or organization: Not on file    Attends meetings of clubs or organizations: Not on file    Relationship status: Not on file  . Intimate partner violence:    Fear of current or ex partner: Not on file    Emotionally abused: Not on file    Physically abused: Not on file    Forced sexual activity: Not on file  Other Topics Concern  . Not on file  Social History Narrative   Married to wife, [name]   Has one 47 year old child   Occupation: Teaches metal sculpture at [employer]    Outpatient Medications Prior to Visit  Medication Sig Dispense Refill  . acetaminophen (TYLENOL) 500 MG tablet Take 500-1,000 mg by mouth daily as needed for moderate pain.    Marland Kitchen dexamethasone (DECADRON) 2 MG tablet Take 1 tablet (2 mg total) by mouth daily. Take at 4 mg twice a day for 5 days and then 4 mg daily  for 5 days. Start on 03/10/2018. 30 tablet 2  . docusate sodium (STOOL SOFTENER) 100 MG capsule Take 100 mg by mouth 2 (two) times daily as needed (constipation).     Marland Kitchen dronabinol (MARINOL) 5 MG capsule Take 1 capsule (5 mg total) by mouth 3 (three) times daily as needed. (Patient taking differently: Take 5 mg by mouth 3 (three) times daily as needed (appetite). ) 90 capsule 3  . Ipratropium-Albuterol (COMBIVENT RESPIMAT) 20-100 MCG/ACT AERS respimat Inhale 1 puff into the lungs every 6 (six) hours as needed for  wheezing. 4 g 0  . lamoTRIgine (LAMICTAL) 100 MG tablet Take 1 tablet (100 mg total) by mouth 2 (two) times daily. 60 tablet 3  . Multiple Vitamin (MULTIVITAMIN WITH MINERALS) TABS tablet Take 1 tablet by mouth 3 (three) times a week.    . prochlorperazine (COMPAZINE) 10 MG tablet Take 10 mg by mouth every 6 (six) hours as needed for nausea or vomiting.    Marland Kitchen tiZANidine (ZANAFLEX) 4 MG tablet Take 0.5-1 tablets (2-4 mg total) by mouth every 8 (eight) hours as needed for muscle spasms. 30 tablet 1  . lidocaine-prilocaine (EMLA) cream APPLY CREAM TOPICALLY TO PORT SITE 1 HOUR PRIOR TO USE AS NEEDED (Patient not taking: Reported on 03/13/2018) 30 g 3  . lidocaine-prilocaine (EMLA) cream Apply 1 application topically as needed (prior to port access). (Patient not taking: Reported on 03/13/2018) 30 g 1   No facility-administered medications prior to visit.     Allergies  Allergen Reactions  . Alprazolam Other (See Comments)    Excessive sedation  . Penicillins Rash    Childhood allergy Has patient had a PCN reaction causing immediate rash, facial/tongue/throat swelling, SOB or lightheadedness with hypotension: Unknown Has patient had a PCN reaction causing severe rash involving mucus membranes or skin necrosis: Unknown Has patient had a PCN reaction that required hospitalization: No Has patient had a PCN reaction occurring within the last 10 years: No If all of the above answers are "NO",  then may proceed with Cephalosporin use.     Review of Systems  Constitutional: Positive for malaise/fatigue. Negative for fever.  HENT: Negative for congestion.   Eyes: Negative for blurred vision.  Respiratory: Negative for shortness of breath.   Cardiovascular: Negative for chest pain, palpitations and leg swelling.  Gastrointestinal: Positive for constipation. Negative for abdominal pain, blood in stool and nausea.  Genitourinary: Negative for dysuria and frequency.  Musculoskeletal: Positive for myalgias. Negative for falls.  Skin: Negative for rash.  Neurological: Negative for dizziness, loss of consciousness and headaches.  Endo/Heme/Allergies: Negative for environmental allergies.  Psychiatric/Behavioral: Negative for depression. The patient is not nervous/anxious.        Objective:    Physical Exam Vitals signs and nursing note reviewed.  Constitutional:      General: He is not in acute distress.    Appearance: He is well-developed.  HENT:     Head: Normocephalic and atraumatic.     Nose: Nose normal.  Eyes:     General:        Right eye: No discharge.        Left eye: No discharge.  Neck:     Musculoskeletal: Normal range of motion and neck supple.  Cardiovascular:     Rate and Rhythm: Normal rate and regular rhythm.     Heart sounds: No murmur.  Pulmonary:     Effort: Pulmonary effort is normal.     Breath sounds: Normal breath sounds.  Abdominal:     General: Bowel sounds are normal.     Palpations: Abdomen is soft.     Tenderness: There is no abdominal tenderness.  Skin:    General: Skin is warm and dry.  Neurological:     Mental Status: He is alert and oriented to person, place, and time.     BP 120/76 (BP Location: Left Arm, Patient Position: Sitting, Cuff Size: Normal)   Pulse 78   Temp (!) 97.4 F (36.3 C) (Oral)   Resp 18   Wt 149 lb 3.2  oz (67.7 kg)   SpO2 98%   BMI 21.41 kg/m  Wt Readings from Last 3 Encounters:  03/27/18 149 lb 3.2  oz (67.7 kg)  03/27/18 146 lb 4.8 oz (66.4 kg)  03/13/18 148 lb 3.2 oz (67.2 kg)     Lab Results  Component Value Date   WBC 7.1 03/27/2018   HGB 10.7 (L) 03/27/2018   HCT 34.6 (L) 03/27/2018   PLT 96 (L) 03/27/2018   GLUCOSE 101 (H) 03/27/2018   ALT 32 03/27/2018   AST 20 03/27/2018   NA 138 03/27/2018   K 3.9 03/27/2018   CL 106 03/27/2018   CREATININE 0.92 03/27/2018   BUN 14 03/27/2018   CO2 21 (L) 03/27/2018   TSH 2.66 11/07/2017   INR 1.05 11/20/2014   HGBA1C 5.1 11/07/2017    Lab Results  Component Value Date   TSH 2.66 11/07/2017   Lab Results  Component Value Date   WBC 7.1 03/27/2018   HGB 10.7 (L) 03/27/2018   HCT 34.6 (L) 03/27/2018   MCV 85.0 03/27/2018   PLT 96 (L) 03/27/2018   Lab Results  Component Value Date   NA 138 03/27/2018   K 3.9 03/27/2018   CHLORIDE 106 03/24/2017   CO2 21 (L) 03/27/2018   GLUCOSE 101 (H) 03/27/2018   BUN 14 03/27/2018   CREATININE 0.92 03/27/2018   BILITOT 0.4 03/27/2018   ALKPHOS 133 (H) 03/27/2018   AST 20 03/27/2018   ALT 32 03/27/2018   PROT 7.1 03/27/2018   ALBUMIN 3.8 03/27/2018   CALCIUM 9.4 03/27/2018   ANIONGAP 11 03/27/2018   EGFR >60 03/24/2017   GFR 115.17 11/07/2017   No results found for: CHOL No results found for: HDL No results found for: LDLCALC No results found for: TRIG No results found for: CHOLHDL Lab Results  Component Value Date   HGBA1C 5.1 11/07/2017       Assessment & Plan:   Problem List Items Addressed This Visit    Anemia    Increase leafy greens, consider increased lean red meat and using cast iron cookware. Continue to monitor, report any concerns      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[place])    Continues to tolerate his treatments and reports less bleeding episodes recently      Thrombocytopenia ([place])    Continues to follow with oncology.       Constipation    Encouraged increased hydration and fiber in diet. Daily probiotics. If bowels not moving can use  MOM 2 tbls po in 4 oz of warm prune juice by mouth every 2-3 days.       Decreased visual acuity    He had an episode of visual loss due to brain met but with treatment it is improving.        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 I am also having him maintain his docusate sodium, tiZANidine, lamoTRIgine, prochlorperazine, dronabinol, multivitamin with minerals, acetaminophen, lidocaine-prilocaine, lidocaine-prilocaine, Ipratropium-Albuterol, and dexamethasone.  Meds ordered this encounter  Medications  . fluticasone (FLOVENT HFA) 110 MCG/ACT inhaler    Sig: Inhale 2 puffs into the lungs 2 (two) times daily.  Dispense:  1 Inhaler    Refill:  12     Penni Homans, MD

## 2018-03-29 NOTE — Assessment & Plan Note (Signed)
Continues to follow with oncology.

## 2018-03-30 ENCOUNTER — Inpatient Hospital Stay: Payer: BC Managed Care – PPO

## 2018-03-30 VITALS — BP 132/87 | HR 104 | Temp 97.9°F | Resp 18

## 2018-03-30 DIAGNOSIS — C20 Malignant neoplasm of rectum: Secondary | ICD-10-CM

## 2018-03-30 DIAGNOSIS — C787 Secondary malignant neoplasm of liver and intrahepatic bile duct: Principal | ICD-10-CM

## 2018-03-30 DIAGNOSIS — Z5111 Encounter for antineoplastic chemotherapy: Secondary | ICD-10-CM | POA: Diagnosis not present

## 2018-03-30 MED ORDER — HEPARIN SOD (PORK) LOCK FLUSH 100 UNIT/ML IV SOLN
500.0000 [IU] | Freq: Once | INTRAVENOUS | Status: AC | PRN
  Administered 2018-03-30: 500 [IU]
  Filled 2018-03-30: qty 5

## 2018-03-30 MED ORDER — PEGFILGRASTIM-CBQV 6 MG/0.6ML ~~LOC~~ SOSY
6.0000 mg | PREFILLED_SYRINGE | Freq: Once | SUBCUTANEOUS | Status: AC
  Administered 2018-03-30: 6 mg via SUBCUTANEOUS

## 2018-03-30 MED ORDER — SODIUM CHLORIDE 0.9% FLUSH
10.0000 mL | INTRAVENOUS | Status: DC | PRN
  Administered 2018-03-30: 10 mL
  Filled 2018-03-30: qty 10

## 2018-03-30 MED ORDER — PEGFILGRASTIM-CBQV 6 MG/0.6ML ~~LOC~~ SOSY
PREFILLED_SYRINGE | SUBCUTANEOUS | Status: AC
  Filled 2018-03-30: qty 0.6

## 2018-04-03 ENCOUNTER — Other Ambulatory Visit: Payer: Self-pay | Admitting: Internal Medicine

## 2018-04-03 ENCOUNTER — Ambulatory Visit: Payer: BC Managed Care – PPO | Attending: Radiation Oncology | Admitting: Radiation Oncology

## 2018-04-05 ENCOUNTER — Telehealth: Payer: Self-pay | Admitting: Radiation Oncology

## 2018-04-05 NOTE — Telephone Encounter (Signed)
I LM for the patient. He missed his 1 month follow up appointment with me this week. I let him know on the message he would be due for his next MRI in March. I'll pass along to navigation to coordinate his next scan but asked that he call back if he was having any problems with his recent course of radiotherapy.

## 2018-04-07 ENCOUNTER — Other Ambulatory Visit: Payer: Self-pay | Admitting: Internal Medicine

## 2018-04-07 ENCOUNTER — Telehealth: Payer: Self-pay | Admitting: Medical Oncology

## 2018-04-07 DIAGNOSIS — C20 Malignant neoplasm of rectum: Secondary | ICD-10-CM

## 2018-04-07 DIAGNOSIS — C787 Secondary malignant neoplasm of liver and intrahepatic bile duct: Principal | ICD-10-CM

## 2018-04-07 MED ORDER — DEXAMETHASONE 1 MG PO TABS: 1.0000 mg | ORAL_TABLET | Freq: Every day | ORAL | 3 refills | Status: DC

## 2018-04-07 NOTE — Telephone Encounter (Signed)
LVM re new rx sent for decadron 1 mg /day and said he can cut in half his decadron 2 mg tablets if he wants to use up those tablets first.

## 2018-04-07 NOTE — Telephone Encounter (Signed)
Symptoms returned -Headaches / right eye no peripheral vision. Off of steroids.  Should he restart steroid?

## 2018-04-09 ENCOUNTER — Other Ambulatory Visit: Payer: Self-pay | Admitting: Oncology

## 2018-04-12 ENCOUNTER — Other Ambulatory Visit: Payer: Self-pay | Admitting: *Deleted

## 2018-04-12 ENCOUNTER — Other Ambulatory Visit: Payer: Self-pay | Admitting: Radiation Therapy

## 2018-04-12 ENCOUNTER — Inpatient Hospital Stay: Payer: BC Managed Care – PPO

## 2018-04-12 ENCOUNTER — Inpatient Hospital Stay (HOSPITAL_BASED_OUTPATIENT_CLINIC_OR_DEPARTMENT_OTHER): Payer: BC Managed Care – PPO | Admitting: Oncology

## 2018-04-12 VITALS — BP 122/81 | HR 100 | Temp 98.8°F | Resp 18 | Ht 70.0 in | Wt 152.4 lb

## 2018-04-12 DIAGNOSIS — R970 Elevated carcinoembryonic antigen [CEA]: Secondary | ICD-10-CM

## 2018-04-12 DIAGNOSIS — Z95828 Presence of other vascular implants and grafts: Secondary | ICD-10-CM

## 2018-04-12 DIAGNOSIS — C20 Malignant neoplasm of rectum: Secondary | ICD-10-CM

## 2018-04-12 DIAGNOSIS — R911 Solitary pulmonary nodule: Secondary | ICD-10-CM

## 2018-04-12 DIAGNOSIS — Z9181 History of falling: Secondary | ICD-10-CM

## 2018-04-12 DIAGNOSIS — C787 Secondary malignant neoplasm of liver and intrahepatic bile duct: Secondary | ICD-10-CM

## 2018-04-12 DIAGNOSIS — C7931 Secondary malignant neoplasm of brain: Secondary | ICD-10-CM

## 2018-04-12 DIAGNOSIS — H5461 Unqualified visual loss, right eye, normal vision left eye: Secondary | ICD-10-CM

## 2018-04-12 DIAGNOSIS — B370 Candidal stomatitis: Secondary | ICD-10-CM

## 2018-04-12 DIAGNOSIS — Z5111 Encounter for antineoplastic chemotherapy: Secondary | ICD-10-CM | POA: Diagnosis not present

## 2018-04-12 LAB — CBC WITH DIFFERENTIAL (CANCER CENTER ONLY)
Abs Immature Granulocytes: 0.11 10*3/uL — ABNORMAL HIGH (ref 0.00–0.07)
Basophils Absolute: 0 10*3/uL (ref 0.0–0.1)
Basophils Relative: 0 %
Eosinophils Absolute: 0.1 10*3/uL (ref 0.0–0.5)
Eosinophils Relative: 1 %
HCT: 31.9 % — ABNORMAL LOW (ref 39.0–52.0)
Hemoglobin: 9.8 g/dL — ABNORMAL LOW (ref 13.0–17.0)
Immature Granulocytes: 1 %
Lymphocytes Relative: 6 %
Lymphs Abs: 0.4 10*3/uL — ABNORMAL LOW (ref 0.7–4.0)
MCH: 26.3 pg (ref 26.0–34.0)
MCHC: 30.7 g/dL (ref 30.0–36.0)
MCV: 85.8 fL (ref 80.0–100.0)
Monocytes Absolute: 0.8 10*3/uL (ref 0.1–1.0)
Monocytes Relative: 10 %
NEUTROS ABS: 6.3 10*3/uL (ref 1.7–7.7)
Neutrophils Relative %: 82 %
Platelet Count: 107 10*3/uL — ABNORMAL LOW (ref 150–400)
RBC: 3.72 MIL/uL — ABNORMAL LOW (ref 4.22–5.81)
RDW: 21.3 % — ABNORMAL HIGH (ref 11.5–15.5)
WBC Count: 7.6 10*3/uL (ref 4.0–10.5)
nRBC: 0 % (ref 0.0–0.2)

## 2018-04-12 LAB — CMP (CANCER CENTER ONLY)
ALBUMIN: 3.4 g/dL — AB (ref 3.5–5.0)
ALT: 24 U/L (ref 0–44)
AST: 24 U/L (ref 15–41)
Alkaline Phosphatase: 168 U/L — ABNORMAL HIGH (ref 38–126)
Anion gap: 8 (ref 5–15)
BUN: 10 mg/dL (ref 6–20)
CO2: 24 mmol/L (ref 22–32)
Calcium: 9.1 mg/dL (ref 8.9–10.3)
Chloride: 106 mmol/L (ref 98–111)
Creatinine: 0.79 mg/dL (ref 0.61–1.24)
GFR, Est AFR Am: >60 mL/min (ref >60)
GFR, Estimated: >60 mL/min (ref >60)
GLUCOSE: 113 mg/dL — AB (ref 70–99)
Potassium: 3.9 mmol/L (ref 3.5–5.1)
Sodium: 138 mmol/L (ref 135–145)
Total Bilirubin: 0.4 mg/dL (ref 0.3–1.2)
Total Protein: 6.5 g/dL (ref 6.5–8.1)

## 2018-04-12 LAB — CEA (IN HOUSE-CHCC): CEA (CHCC-In House): 130.05 ng/mL — ABNORMAL HIGH (ref 0.00–5.00)

## 2018-04-12 MED ORDER — CLOTRIMAZOLE 10 MG MT LOZG: 10.0000 mg | LOZENGE | Freq: Four times a day (QID) | OROMUCOSAL | 0 refills | Status: AC

## 2018-04-12 MED ORDER — FLUCONAZOLE 100 MG PO TABS: 100.0000 mg | ORAL_TABLET | Freq: Every day | ORAL | 0 refills | Status: DC

## 2018-04-12 MED ORDER — SODIUM CHLORIDE 0.9% FLUSH
10.0000 mL | INTRAVENOUS | Status: DC | PRN
  Administered 2018-04-12: 10 mL via INTRAVENOUS
  Filled 2018-04-12: qty 10

## 2018-04-12 MED ORDER — HEPARIN SOD (PORK) LOCK FLUSH 100 UNIT/ML IV SOLN
500.0000 [IU] | Freq: Once | INTRAVENOUS | Status: AC
  Administered 2018-04-12: 500 [IU] via INTRAVENOUS
  Filled 2018-04-12: qty 5

## 2018-04-12 NOTE — Progress Notes (Signed)
[facility] OFFICE PROGRESS NOTE   Diagnosis: Rectal cancer  INTERVAL HISTORY:   Jacob [name] returns as scheduled.  He completed another cycle of FOLFOX on 03/28/2018.  He had nausea following chemotherapy.  He relates this to constipation.  The cough and dyspnea are improved.  No further hemoptysis.  He has developed progressive right visual loss despite resuming Decadron at a dose of 1 mg daily.  He had an episode of "flashing lights "in the right visual field followed by nausea and vomiting.  He has noted enlargement of the right eye. Mild cold sensitivity, no peripheral neuropathy symptoms.  He had a fall injuring his ribs and legs.  He had discomfort at the left lower leg last week.  This has improved.  Objective:  Vital signs in last 24 hours:  Blood pressure 122/81, pulse 100, temperature 98.8 F (37.1 C), temperature source Oral, resp. rate 18, height '5\' 10"'  (1.778 m), weight 152 lb 6.4 oz (69.1 kg), SpO2 100 %.    HEENT: There is thrush over the tongue and buccal mucosa, no ulcers Resp: Decreased breath sounds at the left lower posterior chest, no respiratory distress Cardio: Regular rate and rhythm, rub? GI: No hepatomegaly, left lower quadrant colostomy, nontender Vascular: No leg edema, no erythema or palpable cord at the left lower leg Neuro: Right hemianopsia, left ptosis    Portacath/PICC-without erythema  Lab Results:  Lab Results  Component Value Date   WBC 7.6 04/12/2018   HGB 9.8 (L) 04/12/2018   HCT 31.9 (L) 04/12/2018   MCV 85.8 04/12/2018   PLT 107 (L) 04/12/2018   NEUTROABS 6.3 04/12/2018    CMP  Lab Results  Component Value Date   NA 138 04/12/2018   K 3.9 04/12/2018   CL 106 04/12/2018   CO2 24 04/12/2018   GLUCOSE 113 (H) 04/12/2018   BUN 10 04/12/2018   CREATININE 0.79 04/12/2018   CALCIUM 9.1 04/12/2018   PROT 6.5 04/12/2018   ALBUMIN 3.4 (L) 04/12/2018   AST 24 04/12/2018   ALT 24 04/12/2018   ALKPHOS 168 (H) 04/12/2018     BILITOT 0.4 04/12/2018   GFRNONAA >60 04/12/2018   GFRAA >60 04/12/2018    Lab Results  Component Value Date   CEA1 130.05 (H) 04/12/2018    Medications: I have reviewed the patient's current medications.   Assessment/Plan: 1. Rectal cancer. Partially obstructing mass noted 1-2 cm from the anal verge on a colonoscopy 03/06/2012. Endoscopic ultrasound 03/14/2012 with a 7.5 mm thick, 3.2 cm wide hypoechoic, irregularly bordered mass that clearly passed into and through the muscularis propria layer of the distal rectal wall (uT3); 3 small (largest 7 mm) perirectal lymph nodes. The lymph nodes were all round, discrete, hypoechoic, homogenous; suspicious for malignant involvement (uN1).  No RAS mutation identified by [facility] 1 testing on the colon resection specimen 06/29/2012, APC alteration identified, microsatellite stable  He began radiation and concurrent Xeloda chemotherapy on 03/20/2012, completed 04/27/2012.   Low anterior resection/coloanal anastomosis and diverting ileostomy 06/29/2012 with the final pathology revealing a T2N0 tumor with extensive fibrosis and negative margins.   Cycle 1 of adjuvant CAPOX 07/19/2012. Cycle 5 of adjuvant CAPOX 10/11/2012.   CEA 2.5 on 11/14/2012.   CEA 14.6 03/08/2013.   Restaging CT evaluation 03/08/2013 with a 4 mm pulmonary nodule left lung base not identified on comparison exam; new liver lesions including a 29 x 26 mm irregular peripheral enhancing rounded lesion in the dome of the right hepatic lobe, a  less well-defined new subcapsular lesion in the lateral right hepatic lobe measuring 12 mm, a new subcapsular lesion in the anterior right hepatic lobe adjacent to the gallbladder fossa measuring 10 mm; a rounded low-density lesion in the inferior right hepatic lobe measuring 10 mm compared to 7 mm on the prior study (radiologist commented this may represent an enlarged cyst).   MRI of the abdomen 04/12/2013 confirmed multiple T2  hyperintense metastatic lesions throughout the right liver   Initiation of FOLFIRI/Avastin with genotype based irinotecan dosing per the [facility] study 04/05/2013.   Restaging CT evaluation on 06/20/2013 (after 2 cycles/4 treatments) showed improvement in the liver metastases and stable size of a left lower lobe pulmonary nodule now with central cavitation.   Continuation of FOLFIRI/Avastin.   Restaging CT evaluation 08/14/2013-decrease in the size of liver metastases, slight decrease in the size of a cavitary left lower lobe nodule, no evidence of disease progression.   Status post right hepatic lobectomy 09/17/2013. Pathology showed multiple foci of metastatic adenocarcinoma (5 nodules of metastatic adenocarcinoma 4 of which are subcapsular with the nodules ranging in size from 0.6-1.8 cm in greatest dimension). Margins not involved. Biopsy of a portal lymph node showed benign adipose tissue; no lymph node tissue or malignancy.   Normal CEA 11/06/2013   CT 11/06/2013 with a new pleural-based right lower chest lesion, slight in enlargement of the a left-sided lung nodule  CT 01/29/2014 with a decrease in the right lower chest pleural-based lesion and a slight increase of a left-sided lung lesion, other lung lesions were stable  CT chest 05/02/2014 with a decrease in the size of a right lower lobe pulmonary nodule months similar size of a dominant left lower lobe nodule, minimal enlargement of a smaller left lower lobe nodule, no new site of disease  CT chest 11/07/2014 with a slight increase in several left-sided lung nodules  CT chest 02/10/2015 with new small left hilar lymph nodes, a possible new left lower lobe nodule, and stability of other lung nodules  PET scan 03/12/2015 with hypermetabolic left hilar nodes, hypermetabolic left lower lobe nodules, hyper metabolic retroperitoneal nodes, intense hypermetabolism at the coloanal anastomosis, and hypermetabolic thickening in the  presacral space  Cycle 1 FOLFIRI/PANITUMUMAB 05/21/2015  Cycle 2 FOLFIRI/PANITUMUMAB 06/05/2015  Cycle 3 FOLFIRI/PANITUMUMAB 06/19/2015  Cycle 4 FOLFIRI 07/10/2015  Cycle 5 FOLFIRI 07/24/2015  Restaging CTs 08/06/2015-resolution of hilar/retroperitoneal adenopathy, improvement in the hypermetabolic lung nodule, other lung nodules are stable, no new lesions  Cycle 6 FOLFIRI with PANITUMUMAB 08/28/2015 -panitumumab dose reduced  Cycle 7 FOLFIRI 09/11/2015-no panitumumab given  Cycle 8 FOLFIRI with PANITUMUMAB 09/25/2015-PANITUMUMAB dose reduced  Cycle 9 FOLFIRI 10/09/2015-no PANITUMUMAB given  Cycle 10 FOLFIRI with panitumumab 10/23/2015  CTs 11/06/2015-possible slight enlargement of a left lower lobe nodule, no other evidence of disease progression.  CTs 02/16/2016-enlargement of left-sided pulmonary nodules and mediastinal/left hilar nodes, improved splenomegaly  CTs 06/21/2016-increased size of mediastinal/left hilar nodes, increased left lung nodules, and increased soft tissue at the porta hepatis  CTs 10/28/2016-progressive disease in the chest and abdomen with an enlarging left hilar mass and slight interval enlargement of pulmonary lesions. Right upper quadrant necrotic nodal mass significantly increased in size with mass effect on the liver and possible invasion. Significant compression of the intrahepatic IVC. New right hepatic lobe lesion. Moderate pelvic ascites.  Cycle 1 FOLFIRI/PANITUMUMAB 11/04/2016  Cycle 2 FOLFIRI/PANITUMUMAB 11/18/2016  Cycle 3 FOLFIRI/panitumumab 12/02/2016  Cycle4 FOLFIRI/PANITUMUMAB 12/15/2016  Cycle 5 FOLFIRI/PANITUMUMAB 12/30/2016  CTs 01/06/2017-no evidence of progressive disease,  decreased chest adenopathy, stable left lower lobe nodules, decreased liver metastasis, decreased right retroperitoneal mass  Cycle 6 FOLFIRI/panitumumab 01/13/2017  Cycle 7 FOLFIRI/Panitumumab 01/27/2017  Cycle 8 FOLFIRI/panitumumab 02/17/2017  Cycle 9  FOLFIRI/Panitumumab 03/03/2017  Cycle 10 FOLFIRI/Panitumumab 03/24/2017  CTs 04/06/2017-enlargement of right adrenal mass (smaller than October 2019), stable left hilar fullness and lung nodules  Cycle 11 FOLFIRI/Panitumumab 04/07/2017  Cycle 12 FOLFIRI/panitumumab 04/21/2017  Cycle 13 FOLFIRI/Panitumumab 05/05/2017  Palliative radiation to the right adrenal mass 226 2019-3 02/2018  Brain MRI 07/28/2017-4intracranial metastasis in the right frontal lobe, left parietal lobe and left occipital lobe. Surrounding edema pronounced in the left occipital lobe.  SBRT to for brain lesions on 08/12/2017  CTs 08/10/2017- new left upper lobe airspace process, stable lung nodules and medius and lymphadenopathy, decreased mass involving the adrenal gland with progression of a necrotic anterior portion of the mass, no liver lesions  Cycle 14 FOLFIRI/panitumumab 08/19/2017, irinotecan dose reduced secondary to thrombocytopenia, Panitumumab dose escalated  Cycle 15 FOLFIRI/panitumumab 09/01/2017) Panitumumab dose reduced secondary to an early rash following cycle 14  CT chest 10/24/2017- progression of disease in the left lung; mild progression of left hilar nodal disease with new right hilar lymphadenopathy and progressive increase in lymph nodes in the mediastinum; interval progression of caudate lobe metastatic lesion.  Cycle 1 FOLFIRI/Panitumumab 11/03/2017  Cycle 2 FOLFIRI/Panitumumab 11/17/2017  Cycle 3 FOLFIRI/panitumumab 12/08/2017 (Panitumumab dose increased)  Cycle 4 FOLFIRI/Panitumumab 12/22/2017  Cycle 5 FOLFIRI/Panitumumab 01/05/2018  CTs 01/17/2018- new 4 cm posterior liver mass. Mild growth of separate heterogeneous 5.9 cm mass posterior margin of the liver near the IVC. Newbandlike low-attenuation focus in the posterior inferior liver, indeterminate, favoring expansile right portal vein thrombus. Stable right adrenal metastasis. Mixed response in the chest. Left hilar/AP window adenopathy  mildly increased. Right paratracheal adenopathy mildly increased. Right hilar adenopathy decreased. Left lower lobe pulmonary nodule stable to mildly decreased. Tiny right upper lobe pulmonary nodule slightly increased. New 2.7 cm sub-solid pulmonary nodule basilar right lower lobe. Waxing and waning left upper lobe nodular foci of consolidation. New mild splenomegaly.  Cycle 1 FOLFOX 02/02/2018  Brain MRI 02/13/2018-new left frontal parietal lesion, mild progression of enhancing lesion in the left occipital lobe-previously treated lesion  Cycle 2 FOLFOX 02/24/2018  SRS to 1 brain lesion 03/02/2018  Cycle 3 FOLFOX 03/09/2018  Cycle 4 FOLFOX 03/28/2018 2. History ofIrregular bowel habits/rectal bleeding secondary to #1. 3. History of Mild elevation of the liver enzymes. Question secondary to hepatic steatosis. 4. Indeterminate 8 mm posterior right liver lesion on the staging CT 03/06/2012. 5. Mildly elevated CEA at 5.7 on 03/06/2012. 6. History of radiation erythema at the groin and perineum 7. 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 No clinical evidence of thrombophlebitis or extravasation. 8. Delayed nausea following chemotherapy-Decadron prophylaxis was added with cycle 3 CAPOX-improved. 9. Ileostomy takedown 12/07/2012. 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 11. Port-A-Cath placement 12/31/204 12. Severe neutropenia secondary to chemotherapy following cycle 1 of FOLFIRIin 2015, chemotherapy was dose reduced and he received Neulasta with day 15 cycle 1  13. Nausea and vomiting following cycle 1 of FOLFIRI 14. Rectal stricture-manual/colonoscopic dilatation by Dr. Ardis Hughs 03/01/2016  APR 08/17/2016-no evidence of malignancy 15. History ofLeukopenia/Thrombocytopenia-persistent, potentially a sequelae of chemotherapy or hepatic toxicity from chemotherapy/radiation. Bone marrow biopsy  11/20/2014 showed cellular bone marrow with trilineage hematopoiesis. Significant dyspoiesis was not present and there was no evidence of metastatic carcinoma. Cytogenetic analysis showed the presence of normal male chromosomes  with no observable clonal chromosomal abnormalities.  probable cirrhosis 16. Genetic testing-negative genetic panel in March 2014 17. 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 Severe over the face-steroid Dosepak prescribed 07/03/2015. Improved 07/10/2015. Further improved 07/24/2015, 08/07/2015, 08/28/2015 18. Diarrhea secondary to chemotherapy-encouraged to use Imodium  19. History of Paronychia secondary to PANITUMUMAB 20. 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 History of neutropenia, thrombocytopenia 06/23/2017. Possibly related to radiation. He also has a history of suspected portal hypertension secondaryto toxicity from chemotherapy 22. Seizure in the setting of brain metastases 08/05/2017, now maintained onLamictal 23. Altered mental status evaluated by neuro-oncology, Keppra discontinued, improved 24. 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  He has completed 4 cycles of salvage therapy with FOLFOX.  He has multiple complaints today.  I am concerned the rectal cancer is progressing.  The CEA is higher.  We decided to hold chemotherapy today.  He will be referred for a restaging CT and return for an office visit next week.  He will complete a course of Diflucan for oral candidiasis.  I discussed the case with Dr. Mickeal Skinner.  Jacob [name] will increase the Decadron to 4 mg daily.  Dr. Mickeal Skinner will arrange for a repeat brain MRI.  25 minutes were spent with the patient today.  The majority of the time was used for counseling and coordination of  care.  Betsy Coder, MD  04/12/2018  2:56 PM

## 2018-04-13 ENCOUNTER — Telehealth: Payer: Self-pay | Admitting: Oncology

## 2018-04-13 NOTE — Telephone Encounter (Signed)
Scheduled appt per 1/22 los - left message for patient with appt date and time

## 2018-04-14 ENCOUNTER — Inpatient Hospital Stay: Payer: BC Managed Care – PPO

## 2018-04-18 ENCOUNTER — Ambulatory Visit
Admission: RE | Admit: 2018-04-18 | Discharge: 2018-04-18 | Disposition: A | Discharge status: Home / Self Care | Payer: BC Managed Care – PPO | Source: Ambulatory Visit | Attending: Radiation Oncology | Admitting: Radiation Oncology

## 2018-04-18 DIAGNOSIS — C7931 Secondary malignant neoplasm of brain: Secondary | ICD-10-CM

## 2018-04-18 MED ORDER — GADOBENATE DIMEGLUMINE 529 MG/ML IV SOLN
13.0000 mL | Freq: Once | INTRAVENOUS | Status: AC | PRN
  Administered 2018-04-18: 13 mL via INTRAVENOUS

## 2018-04-19 ENCOUNTER — Encounter (HOSPITAL_COMMUNITY): Payer: Self-pay

## 2018-04-19 ENCOUNTER — Inpatient Hospital Stay (HOSPITAL_BASED_OUTPATIENT_CLINIC_OR_DEPARTMENT_OTHER): Payer: BC Managed Care – PPO | Admitting: Internal Medicine

## 2018-04-19 ENCOUNTER — Ambulatory Visit (HOSPITAL_COMMUNITY)
Admission: RE | Admit: 2018-04-19 | Discharge: 2018-04-19 | Disposition: A | Discharge status: Home / Self Care | Payer: BC Managed Care – PPO | Source: Ambulatory Visit | Attending: Oncology | Admitting: Oncology

## 2018-04-19 ENCOUNTER — Inpatient Hospital Stay: Payer: BC Managed Care – PPO

## 2018-04-19 VITALS — BP 112/77 | HR 102 | Temp 97.7°F | Resp 18 | Ht 70.0 in | Wt 154.3 lb

## 2018-04-19 DIAGNOSIS — C20 Malignant neoplasm of rectum: Secondary | ICD-10-CM | POA: Diagnosis present

## 2018-04-19 DIAGNOSIS — Z5111 Encounter for antineoplastic chemotherapy: Secondary | ICD-10-CM | POA: Diagnosis not present

## 2018-04-19 DIAGNOSIS — R569 Unspecified convulsions: Secondary | ICD-10-CM

## 2018-04-19 DIAGNOSIS — C7931 Secondary malignant neoplasm of brain: Secondary | ICD-10-CM

## 2018-04-19 DIAGNOSIS — C787 Secondary malignant neoplasm of liver and intrahepatic bile duct: Secondary | ICD-10-CM | POA: Insufficient documentation

## 2018-04-19 MED ORDER — IOHEXOL 300 MG/ML  SOLN
100.0000 mL | Freq: Once | INTRAMUSCULAR | Status: AC | PRN
  Administered 2018-04-19: 100 mL via INTRAVENOUS

## 2018-04-19 MED ORDER — SODIUM CHLORIDE (PF) 0.9 % IJ SOLN
INTRAMUSCULAR | Status: AC
  Filled 2018-04-19: qty 50

## 2018-04-19 NOTE — Progress Notes (Signed)
[facility] [place], [place] [address] [phone]   Interval Evaluation  Date of Service: 04/19/18 Patient Name: Jacob [name] Patient MRN: [id number] Patient DOB: 25-Apr-1970 Provider: Ventura Sellers, MD  Identifying Statement:  Jacob [name] is a 48 y.o. 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[place])    03/20/2012 - 04/27/2012 Radiation Therapy    RT for 28 Fractions w / Xeloda     04/30/2015 - 01/18/2018 Chemotherapy    The patient had palonosetron (ALOXI) injection 0.25 mg, 0.25 mg, Intravenous,  Once, 30 of 32 cycles Administration: 0.25 mg (05/21/2015), 0.25 mg (06/05/2015), 0.25 mg (06/19/2015), 0.25 mg (07/10/2015), 0.25 mg (07/24/2015), 0.25 mg (08/28/2015), 0.25 mg (09/11/2015), 0.25 mg (09/25/2015), 0.25 mg (10/09/2015), 0.25 mg (10/23/2015), 0.25 mg (11/04/2016), 0.25 mg (11/18/2016), 0.25 mg (12/02/2016), 0.25 mg (12/15/2016), 0.25 mg (12/30/2016), 0.25 mg (01/13/2017), 0.25 mg (01/27/2017), 0.25 mg (02/17/2017), 0.25 mg (03/03/2017), 0.25 mg (03/24/2017), 0.25 mg (04/07/2017), 0.25 mg (04/21/2017), 0.25 mg (05/05/2017), 0.25 mg (08/19/2017), 0.25 mg (09/01/2017), 0.25 mg (11/03/2017), 0.25 mg (11/17/2017), 0.25 mg (12/08/2017), 0.25 mg (12/22/2017), 0.25 mg (01/05/2018) pegfilgrastim (NEULASTA) injection 6 mg, 6 mg, Subcutaneous, Once, 23 of 23 cycles Administration: 6 mg (05/23/2015), 6 mg (06/07/2015), 6 mg (06/21/2015), 6 mg (07/12/2015), 6 mg (07/26/2015), 6 mg (08/30/2015), 6 mg (09/13/2015), 6 mg (09/27/2015), 6 mg (10/11/2015), 6 mg (10/25/2015), 6 mg (11/06/2016), 6 mg (11/20/2016), 6 mg (12/04/2016), 6 mg (12/17/2016), 6 mg (01/01/2017), 6 mg (01/15/2017), 6 mg (01/29/2017), 6 mg (02/19/2017), 6 mg (03/05/2017), 6 mg (03/26/2017), 6 mg (04/09/2017), 6 mg (04/23/2017), 6 mg (05/07/2017) pegfilgrastim-cbqv (UDENYCA)  injection 6 mg, 6 mg, Subcutaneous, Once, 6 of 8 cycles Administration: 6 mg (08/22/2017), 6 mg (11/05/2017), 6 mg (11/19/2017), 6 mg (12/10/2017), 6 mg (12/24/2017), 6 mg (01/07/2018) irinotecan (CAMPTOSAR) 258 mg in dextrose 5 % 500 mL chemo infusion, 135 mg/m2 = 258 mg (75 % of original dose 180 mg/m2), Intravenous,  Once, 30 of 32 cycles Dose modification: 135 mg/m2 (original dose 180 mg/m2, Cycle 1, Reason: Provider Judgment), 100 mg/m2 (original dose 180 mg/m2, Cycle 24, Reason: Provider Judgment) Administration: 258 mg (05/21/2015), 260 mg (06/05/2015), 258 mg (06/19/2015), 258 mg (07/10/2015), 260 mg (07/24/2015), 260 mg (08/28/2015), 260 mg (09/11/2015), 260 mg (09/25/2015), 260 mg (10/23/2015), 260 mg (11/04/2016), 240 mg (11/18/2016), 240 mg (12/02/2016), 240 mg (12/15/2016), 240 mg (12/30/2016), 240 mg (01/13/2017), 240 mg (01/27/2017), 240 mg (02/17/2017), 240 mg (03/03/2017), 240 mg (03/24/2017), 240 mg (04/07/2017), 240 mg (04/21/2017), 240 mg (05/05/2017), 180 mg (08/19/2017), 180 mg (09/01/2017), 180 mg (11/03/2017), 180 mg (11/17/2017), 180 mg (12/08/2017), 180 mg (12/22/2017), 180 mg (01/05/2018) leucovorin 764 mg in dextrose 5 % 250 mL infusion, 400 mg/m2 = 764 mg, Intravenous,  Once, 30 of 32 cycles Administration: 764 mg (05/21/2015), 764 mg (06/05/2015), 764 mg (06/19/2015), 764 mg (07/10/2015), 760 mg (07/24/2015), 760 mg (08/28/2015), 760 mg (09/11/2015), 760 mg (09/25/2015), 764 mg (10/09/2015), 764 mg (10/23/2015), 764 mg (11/04/2016), 764 mg (11/18/2016), 764 mg (12/02/2016), 764 mg (12/15/2016), 716 mg (12/30/2016), 716 mg (01/13/2017), 716 mg (01/27/2017), 716 mg (02/17/2017), 716 mg (03/03/2017), 716 mg (03/24/2017), 716 mg (04/07/2017), 716 mg (04/21/2017), 716 mg (05/05/2017), 736 mg (08/19/2017), 736 mg (09/01/2017), 736 mg (11/03/2017), 736 mg (11/17/2017), 736 mg (12/08/2017), 736 mg (12/22/2017), 736 mg (01/05/2018) fluorouracil (  ADRUCIL) chemo injection 750 mg, 400 mg/m2 = 750 mg, Intravenous,  Once, 10 of 10 cycles Administration: 750  mg (05/21/2015), 750 mg (06/05/2015), 750 mg (06/19/2015), 750 mg (07/10/2015), 750 mg (07/24/2015), 750 mg (08/28/2015), 750 mg (09/11/2015), 750 mg (09/25/2015), 750 mg (10/09/2015), 750 mg (10/23/2015) leucovorin injection 38 mg, 20 mg/m2 = 38 mg, Intravenous,  Once, 1 of 1 cycle fosaprepitant (EMEND) 150 mg, dexamethasone (DECADRON) 12 mg in sodium chloride 0.9 % 145 mL IVPB, , Intravenous,  Once, 30 of 32 cycles Administration:  (05/21/2015),  (06/05/2015),  (06/19/2015),  (07/10/2015),  (07/24/2015),  (08/28/2015),  (09/11/2015),  (09/25/2015),  (10/09/2015),  (10/23/2015),  (11/04/2016),  (11/18/2016),  (12/02/2016),  (12/15/2016),  (12/30/2016),  (01/13/2017),  (01/27/2017),  (02/17/2017),  (03/03/2017),  (03/24/2017),  (04/07/2017),  (04/21/2017),  (05/05/2017),  (08/19/2017),  (09/01/2017),  (11/03/2017),  (11/17/2017),  (12/08/2017),  (12/22/2017),  (01/05/2018) fluorouracil (ADRUCIL) 4,600 mg in sodium chloride 0.9 % 58 mL chemo infusion, 2,400 mg/m2 = 4,600 mg, Intravenous, 1 Day/Dose, 30 of 32 cycles Administration: 4,600 mg (05/21/2015), 4,600 mg (06/05/2015), 4,600 mg (06/19/2015), 4,600 mg (07/10/2015), 4,600 mg (07/24/2015), 4,600 mg (08/28/2015), 4,600 mg (09/11/2015), 4,600 mg (09/25/2015), 4,600 mg (10/09/2015), 4,600 mg (10/23/2015), 4,600 mg (11/04/2016), 4,600 mg (11/18/2016), 4,600 mg (12/02/2016), 4,600 mg (12/15/2016), 4,300 mg (12/30/2016), 4,300 mg (01/13/2017), 4,300 mg (01/27/2017), 4,300 mg (02/17/2017), 4,300 mg (03/03/2017), 4,300 mg (03/24/2017), 4,300 mg (04/07/2017), 4,300 mg (04/21/2017), 4,300 mg (05/05/2017), 4,300 mg (08/19/2017), 4,400 mg (09/01/2017), 4,400 mg (11/03/2017), 4,400 mg (11/17/2017), 4,400 mg (12/08/2017), 4,400 mg (12/22/2017), 4,400 mg (01/05/2018)  for chemotherapy treatment.     02/02/2018 -  Chemotherapy    The patient had palonosetron (ALOXI) injection 0.25 mg, 0.25 mg, Intravenous,  Once, 4 of 6 cycles Administration: 0.25 mg (02/02/2018), 0.25 mg (02/24/2018), 0.25 mg (03/09/2018), 0.25 mg (03/28/2018) pegfilgrastim-cbqv  (UDENYCA) injection 6 mg, 6 mg, Subcutaneous, Once, 3 of 5 cycles Administration: 6 mg (03/11/2018), 6 mg (03/30/2018) leucovorin 728 mg in dextrose 5 % 250 mL infusion, 400 mg/m2 = 728 mg, Intravenous,  Once, 4 of 6 cycles Administration: 728 mg (02/02/2018), 728 mg (02/24/2018), 728 mg (03/09/2018), 728 mg (03/28/2018) oxaliplatin (ELOXATIN) 150 mg in dextrose 5 % 500 mL chemo infusion, 82 mg/m2 = 155 mg, Intravenous,  Once, 4 of 6 cycles Dose modification: 60 mg/m2 (original dose 85 mg/m2, Cycle 2, Reason: Provider Judgment) Administration: 150 mg (02/02/2018), 110 mg (02/24/2018), 110 mg (03/09/2018), 110 mg (03/28/2018) fluorouracil (ADRUCIL) 4,350 mg in sodium chloride 0.9 % 63 mL chemo infusion, 2,400 mg/m2 = 4,350 mg, Intravenous, 1 Day/Dose, 4 of 6 cycles Administration: 4,350 mg (02/02/2018), 4,350 mg (02/24/2018), 4,350 mg (03/09/2018), 4,350 mg (03/28/2018)  for chemotherapy treatment.      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[place])    04/30/2015 - 01/18/2018 Chemotherapy    The patient had palonosetron (ALOXI) injection 0.25 mg, 0.25 mg, Intravenous,  Once, 30 of 32 cycles Administration: 0.25 mg (05/21/2015), 0.25 mg (06/05/2015), 0.25 mg (06/19/2015), 0.25 mg (07/10/2015), 0.25 mg (07/24/2015), 0.25 mg (08/28/2015), 0.25 mg (09/11/2015), 0.25 mg (09/25/2015), 0.25 mg (10/09/2015), 0.25 mg (10/23/2015), 0.25 mg (11/04/2016), 0.25 mg (11/18/2016), 0.25 mg (12/02/2016), 0.25 mg (12/15/2016), 0.25 mg (12/30/2016), 0.25 mg (01/13/2017), 0.25 mg (01/27/2017), 0.25 mg (02/17/2017), 0.25 mg (03/03/2017), 0.25 mg (03/24/2017), 0.25 mg (04/07/2017), 0.25 mg (04/21/2017), 0.25 mg (05/05/2017), 0.25 mg (08/19/2017), 0.25 mg (09/01/2017), 0.25 mg (11/03/2017), 0.25 mg (11/17/2017), 0.25 mg (12/08/2017), 0.25 mg (12/22/2017), 0.25  mg (01/05/2018) pegfilgrastim (NEULASTA) injection 6 mg, 6 mg, Subcutaneous, Once, 23 of 23 cycles Administration: 6 mg (05/23/2015), 6 mg  (06/07/2015), 6 mg (06/21/2015), 6 mg (07/12/2015), 6 mg (07/26/2015), 6 mg (08/30/2015), 6 mg (09/13/2015), 6 mg (09/27/2015), 6 mg (10/11/2015), 6 mg (10/25/2015), 6 mg (11/06/2016), 6 mg (11/20/2016), 6 mg (12/04/2016), 6 mg (12/17/2016), 6 mg (01/01/2017), 6 mg (01/15/2017), 6 mg (01/29/2017), 6 mg (02/19/2017), 6 mg (03/05/2017), 6 mg (03/26/2017), 6 mg (04/09/2017), 6 mg (04/23/2017), 6 mg (05/07/2017) pegfilgrastim-cbqv (UDENYCA) injection 6 mg, 6 mg, Subcutaneous, Once, 6 of 8 cycles Administration: 6 mg (08/22/2017), 6 mg (11/05/2017), 6 mg (11/19/2017), 6 mg (12/10/2017), 6 mg (12/24/2017), 6 mg (01/07/2018) irinotecan (CAMPTOSAR) 258 mg in dextrose 5 % 500 mL chemo infusion, 135 mg/m2 = 258 mg (75 % of original dose 180 mg/m2), Intravenous,  Once, 30 of 32 cycles Dose modification: 135 mg/m2 (original dose 180 mg/m2, Cycle 1, Reason: Provider Judgment), 100 mg/m2 (original dose 180 mg/m2, Cycle 24, Reason: Provider Judgment) Administration: 258 mg (05/21/2015), 260 mg (06/05/2015), 258 mg (06/19/2015), 258 mg (07/10/2015), 260 mg (07/24/2015), 260 mg (08/28/2015), 260 mg (09/11/2015), 260 mg (09/25/2015), 260 mg (10/23/2015), 260 mg (11/04/2016), 240 mg (11/18/2016), 240 mg (12/02/2016), 240 mg (12/15/2016), 240 mg (12/30/2016), 240 mg (01/13/2017), 240 mg (01/27/2017), 240 mg (02/17/2017), 240 mg (03/03/2017), 240 mg (03/24/2017), 240 mg (04/07/2017), 240 mg (04/21/2017), 240 mg (05/05/2017), 180 mg (08/19/2017), 180 mg (09/01/2017), 180 mg (11/03/2017), 180 mg (11/17/2017), 180 mg (12/08/2017), 180 mg (12/22/2017), 180 mg (01/05/2018) leucovorin 764 mg in dextrose 5 % 250 mL infusion, 400 mg/m2 = 764 mg, Intravenous,  Once, 30 of 32 cycles Administration: 764 mg (05/21/2015), 764 mg (06/05/2015), 764 mg (06/19/2015), 764 mg (07/10/2015), 760 mg (07/24/2015), 760 mg (08/28/2015), 760 mg (09/11/2015), 760 mg (09/25/2015), 764 mg (10/09/2015), 764 mg (10/23/2015), 764 mg (11/04/2016), 764 mg (11/18/2016), 764 mg (12/02/2016), 764 mg (12/15/2016), 716 mg (12/30/2016), 716 mg  (01/13/2017), 716 mg (01/27/2017), 716 mg (02/17/2017), 716 mg (03/03/2017), 716 mg (03/24/2017), 716 mg (04/07/2017), 716 mg (04/21/2017), 716 mg (05/05/2017), 736 mg (08/19/2017), 736 mg (09/01/2017), 736 mg (11/03/2017), 736 mg (11/17/2017), 736 mg (12/08/2017), 736 mg (12/22/2017), 736 mg (01/05/2018) fluorouracil (ADRUCIL) chemo injection 750 mg, 400 mg/m2 = 750 mg, Intravenous,  Once, 10 of 10 cycles Administration: 750 mg (05/21/2015), 750 mg (06/05/2015), 750 mg (06/19/2015), 750 mg (07/10/2015), 750 mg (07/24/2015), 750 mg (08/28/2015), 750 mg (09/11/2015), 750 mg (09/25/2015), 750 mg (10/09/2015), 750 mg (10/23/2015) leucovorin injection 38 mg, 20 mg/m2 = 38 mg, Intravenous,  Once, 1 of 1 cycle fosaprepitant (EMEND) 150 mg, dexamethasone (DECADRON) 12 mg in sodium chloride 0.9 % 145 mL IVPB, , Intravenous,  Once, 30 of 32 cycles Administration:  (05/21/2015),  (06/05/2015),  (06/19/2015),  (07/10/2015),  (07/24/2015),  (08/28/2015),  (09/11/2015),  (09/25/2015),  (10/09/2015),  (10/23/2015),  (11/04/2016),  (11/18/2016),  (12/02/2016),  (12/15/2016),  (12/30/2016),  (01/13/2017),  (01/27/2017),  (02/17/2017),  (03/03/2017),  (03/24/2017),  (04/07/2017),  (04/21/2017),  (05/05/2017),  (08/19/2017),  (09/01/2017),  (11/03/2017),  (11/17/2017),  (12/08/2017),  (12/22/2017),  (01/05/2018) fluorouracil (ADRUCIL) 4,600 mg in sodium chloride 0.9 % 58 mL chemo infusion, 2,400 mg/m2 = 4,600 mg, Intravenous, 1 Day/Dose, 30 of 32 cycles Administration: 4,600 mg (05/21/2015), 4,600 mg (06/05/2015), 4,600 mg (06/19/2015), 4,600 mg (07/10/2015), 4,600 mg (07/24/2015), 4,600 mg (08/28/2015), 4,600 mg (09/11/2015), 4,600 mg (09/25/2015), 4,600 mg (10/09/2015), 4,600 mg (10/23/2015), 4,600 mg (11/04/2016), 4,600 mg (11/18/2016), 4,600 mg (12/02/2016), 4,600 mg (12/15/2016), 4,300 mg (12/30/2016), 4,300 mg (01/13/2017), 4,300  mg (01/27/2017), 4,300 mg (02/17/2017), 4,300 mg (03/03/2017), 4,300 mg (03/24/2017), 4,300 mg (04/07/2017), 4,300 mg (04/21/2017), 4,300 mg (05/05/2017), 4,300 mg  (08/19/2017), 4,400 mg (09/01/2017), 4,400 mg (11/03/2017), 4,400 mg (11/17/2017), 4,400 mg (12/08/2017), 4,400 mg (12/22/2017), 4,400 mg (01/05/2018)  for chemotherapy treatment.     02/02/2018 -  Chemotherapy    The patient had palonosetron (ALOXI) injection 0.25 mg, 0.25 mg, Intravenous,  Once, 4 of 6 cycles Administration: 0.25 mg (02/02/2018), 0.25 mg (02/24/2018), 0.25 mg (03/09/2018), 0.25 mg (03/28/2018) pegfilgrastim-cbqv (UDENYCA) injection 6 mg, 6 mg, Subcutaneous, Once, 3 of 5 cycles Administration: 6 mg (03/11/2018), 6 mg (03/30/2018) leucovorin 728 mg in dextrose 5 % 250 mL infusion, 400 mg/m2 = 728 mg, Intravenous,  Once, 4 of 6 cycles Administration: 728 mg (02/02/2018), 728 mg (02/24/2018), 728 mg (03/09/2018), 728 mg (03/28/2018) oxaliplatin (ELOXATIN) 150 mg in dextrose 5 % 500 mL chemo infusion, 82 mg/m2 = 155 mg, Intravenous,  Once, 4 of 6 cycles Dose modification: 60 mg/m2 (original dose 85 mg/m2, Cycle 2, Reason: Provider Judgment) Administration: 150 mg (02/02/2018), 110 mg (02/24/2018), 110 mg (03/09/2018), 110 mg (03/28/2018) fluorouracil (ADRUCIL) 4,350 mg in sodium chloride 0.9 % 63 mL chemo infusion, 2,400 mg/m2 = 4,350 mg, Intravenous, 1 Day/Dose, 4 of 6 cycles Administration: 4,350 mg (02/02/2018), 4,350 mg (02/24/2018), 4,350 mg (03/09/2018), 4,350 mg (03/28/2018)  for chemotherapy treatment.      Interim History:  ANDRES VEST presents following recent MRI brain.  Unfortunately he describes "creeping" of visual loss on the right side, including some episodes of "flashing lights" on the right.  He has also experienced headaches although those have tailed off with the steroids.  Finally, he did have an episode where he as "unable to read writing on a page of text".  That appears to have resolved but was very concerning to him.  No further clinical seizures aside from lights phenomena.  Has restaging scans pending.  Still working part-time.  Medications: Current Outpatient Medications  on File Prior to Visit  Medication Sig Dispense Refill  . acetaminophen (TYLENOL) 500 MG tablet Take 500-1,000 mg by mouth daily as needed for moderate pain.    . clotrimazole (MYCELEX) 10 MG troche Take 1 lozenge (10 mg total) by mouth 4 (four) times daily. While on decadron 120 lozenge 0  . dexamethasone (DECADRON) 1 MG tablet Take 1 tablet (1 mg total) by mouth daily. (Patient taking differently: Take 4 mg by mouth daily. ) 30 tablet 3  . docusate sodium (STOOL SOFTENER) 100 MG capsule Take 100 mg by mouth 2 (two) times daily as needed (constipation).     Marland Kitchen dronabinol (MARINOL) 5 MG capsule Take 1 capsule (5 mg total) by mouth 3 (three) times daily as needed. (Patient taking differently: Take 5 mg by mouth 3 (three) times daily as needed (appetite). ) 90 capsule 3  . fluconazole (DIFLUCAN) 100 MG tablet Take 1 tablet (100 mg total) by mouth daily. 4 tablet 0  . fluticasone (FLOVENT HFA) 110 MCG/ACT inhaler Inhale 2 puffs into the lungs 2 (two) times daily. 1 Inhaler 12  . lamoTRIgine (LAMICTAL) 100 MG tablet TAKE 1 TABLET BY MOUTH TWICE DAILY 60 tablet 0  . lidocaine-prilocaine (EMLA) cream Apply 1 application topically as needed (prior to port access). 30 g 1  . Multiple Vitamin (MULTIVITAMIN WITH MINERALS) TABS tablet Take 1 tablet by mouth 3 (three) times a week.    . prochlorperazine (COMPAZINE) 10 MG tablet Take 10 mg by mouth every 6 (six) hours  as needed for nausea or vomiting.    . sennosides-docusate sodium (SENOKOT-S) 8.6-50 MG tablet Take 1 tablet by mouth daily as needed for constipation.    Marland Kitchen tiZANidine (ZANAFLEX) 4 MG tablet Take 0.5-1 tablets (2-4 mg total) by mouth every 8 (eight) hours as needed for muscle spasms. 30 tablet 1   No current facility-administered medications on file prior to visit.     Allergies:  Allergies  Allergen Reactions  . Alprazolam Other (See Comments)    Excessive sedation  . Penicillins Rash    Childhood allergy Has patient had a PCN reaction  causing immediate rash, facial/tongue/throat swelling, SOB or lightheadedness with hypotension: Unknown Has patient had a PCN reaction causing severe rash involving mucus membranes or skin necrosis: Unknown Has patient had a PCN reaction that required hospitalization: No Has patient had a PCN reaction occurring within the last 10 years: No If all of the above answers are "NO", then may proceed with Cephalosporin use.    Past Medical History:  Past Medical History:  Diagnosis Date  . Allergic state 01/19/2012  . Anemia   . 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 Chicken pox as a child   ?  Marland Kitchen Dysrhythmia as child   HX OF IRREGULAR HB AT TIME OF TONSILLECTOMY - SURGERY WAS NOT DONE-CAN'T REMEMBER ANY OTHER DETAILS- NEVER HAD THE SURGERY.  . Elevated liver function tests 01/19/2012  . Fatty liver   . Hernia 6 months old  . History of diarrhea    better since chemo finished  . Hx of migraines   . Lung abnormality 2015  . Lung nodule 08/2013  . Lung nodule, multiple 11/29/2013  . Migraine headache as a child  . Numbness    TOES OF BOTH FEET.  Marland Kitchen Overweight(278.02) 01/19/2012  . Pain    LEFT HEEL PAIN -SEVERE ESPECIALLY AFTER SITTING OR LYING DOWN - DIFFICULT TO GET OUT OF BED AND WALK IN THE MORNINGS BECAUSE OF HEEL PAIN  . 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 Preventative health care 01/19/2012  . Radiation 03/20/12-04/27/12   Pelvis 50.4 gray Rectal cancer  . Rectal cancer ([place]) 03/06/12   biopsy-adenocarcinoma  . Reflux 01/19/2012  . Sun-damaged skin 01/19/2012   Past Surgical History:  Past Surgical History:  Procedure Laterality Date  . CHOLECYSTECTOMY N/A 09/17/2013   Procedure: CHOLECYSTECTOMY;  Surgeon: Stark Klein, MD;  Location: [facility];  Service: General;  Laterality: N/A;  . COLOSTOMY TAKEDOWN N/A 08/17/2016   Procedure: LAPAROSCOPIC ABDOMINOPERINEAL RESECTION WITH PERMANENT  COLOSTOMY;  Surgeon: Leighton Ruff, MD;  Location: [facility];  Service: General;  Laterality: N/A;  . CYST EXCISION     L EAR AREA  . EUS  03/14/2012   Procedure: LOWER ENDOSCOPIC ULTRASOUND (EUS);  Surgeon: Milus Banister, MD;  Location: [facility];  Service: Endoscopy;  Laterality: N/A;  . HERNIA REPAIR  6 months old   right inguinal repair  . ILEOSTOMY CLOSURE N/A 12/07/2012   Procedure: ILEOSTOMY TAKEDOWN;  Surgeon: Leighton Ruff, MD;  Location: [facility];  Service: General;  Laterality: N/A;  . LAPAROSCOPIC LOW ANTERIOR RESECTION N/A 06/29/2012   Procedure: LAPAROSCOPIC LOW ANTERIOR RESECTION, mobilization splenic flexure,coloanal anastomosis,diverting ileostomy;  Surgeon: Leighton Ruff, MD;  Location: [facility];  Service: General;  Laterality: N/A;  . LAPAROSCOPY N/A 09/17/2013   Procedure: LAPAROSCOPY DIAGNOSTIC;  Surgeon: Stark Klein, MD;  Location: [facility];  Service:  General;  Laterality: N/A;  . LARYNGOPLASTY Left 02/10/2018   Procedure: LARYNGOPLASTY;  Surgeon: Melissa Montane, MD;  Location: [facility];  Service: ENT;  Laterality: Left;  . LIVER ULTRASOUND N/A 09/17/2013   Procedure: LIVER ULTRASOUND;  Surgeon: Stark Klein, MD;  Location: [facility];  Service: General;  Laterality: N/A;  . MICROLARYNGOSCOPY W/VOCAL CORD INJECTION Left 05/16/2017   Procedure: MICROLARYNGOSCOPY WITH VOCAL CORD INJECTION;  Surgeon: Melissa Montane, MD;  Location: [facility];  Service: ENT;  Laterality: Left;  . OPEN HEPATECTOMY  N/A 09/17/2013   Procedure: OPEN HEPATECTOMY;  Surgeon: Stark Klein, MD;  Location: [facility];  Service: General;  Laterality: N/A;  . PORTACATH PLACEMENT Left 03/21/2013   Procedure: INSERTION PORT-A-CATH;  Surgeon: Leighton Ruff, MD;  Location: [facility];  Service: General;  Laterality: Left;  . RECTAL BIOPSY  03/06/12   distal mass1-2cm from anal verge=Adenocarcinoma  . TONSILLECTOMY     Social History:  Social History   Socioeconomic History  . Marital status: Married    Spouse name:  Not on file  . Number of children: 1  . Years of education: Not on file  . Highest education level: Not on file  Occupational History  . Occupation: Midwife: [employer]    Comment: [employer]  Social Needs  . Financial resource strain: Not on file  . Food insecurity:    Worry: Not on file    Inability: Not on file  . Transportation needs:    Medical: Not on file    Non-medical: Not on file  Tobacco Use  . Smoking status: Never Smoker  . Smokeless tobacco: Never Used  Substance and Sexual Activity  . Alcohol use: Not Currently    Alcohol/week: 0.0 standard drinks    Comment: RARE   . Drug use: No    Comment: marijuana past  . Sexual activity: Yes    Partners: Female  Lifestyle  . Physical activity:    Days per week: Not on file    Minutes per session: Not on file  . Stress: Not on file  Relationships  . Social connections:    Talks on phone: Not on file    Gets together: Not on file    Attends religious service: Not on file    Active member of club or organization: Not on file    Attends meetings of clubs or organizations: Not on file    Relationship status: Not on file  . Intimate partner violence:    Fear of current or ex partner: Not on file    Emotionally abused: Not on file    Physically abused: Not on file    Forced sexual activity: Not on file  Other Topics Concern  . Not on file  Social History Narrative   Married to wife, [name]   Has one 90 year old child   Occupation: Emergency planning/management officer at [employer]   Family History:  Family History  Adopted: Yes  Problem Relation Age of Onset  . Cancer Father   . Heart disease Father   . Colon cancer Neg Hx     Review of Systems: Constitutional: Denies fevers, chills or abnormal weight loss Eyes: Denies blurriness of vision Ears, nose, mouth, throat, and face: Denies mucositis or sore throat Respiratory: Denies cough, dyspnea or wheezes Cardiovascular: Denies palpitation, chest discomfort or lower  extremity swelling Gastrointestinal:  Colostomy GU: Denies dysuria or incontinence Skin: Denies abnormal skin rashes Neurological: +headaches Musculoskeletal: +joint pain, No decrease  in ROM Behavioral/Psych: mood instability   Physical Exam: Vitals:   04/19/18 1109  BP: 112/77  Pulse: (!) 102  Resp: 18  Temp: 97.7 F (36.5 C)  SpO2: 97%   KPS: 90. General: Alert, cooperative, pleasant, in no acute distress Head: Normal EENT: No conjunctival injection or scleral icterus. Oral mucosa moist Lungs: Resp effort normal Cardiac: Regular rate and rhythm Abdomen: Colostomy C/D/I Skin: No rashes cyanosis or petechiae. Extremities: No clubbing or edema  Neurologic Exam: Mental Status: Awake, alert, attentive to examiner. Language is fluent with intact comprehension.  Cranial Nerves: Visual acuity is grossly normal. Right homonymous hemianopia. Extra-ocular movements intact. No ptosis. Face is symmetric, tongue midline. Motor: Tone and bulk are normal. Power is full in both arms and legs. Reflexes are symmetric, no pathologic reflexes present. Intact finger to nose bilaterally Sensory: Intact to light touch and temperature Gait: Normal and tandem gait is normal.   Labs: I have reviewed the data as listed    Component Value Date/Time   NA 138 04/12/2018 1028   NA 142 03/24/2017 1009   K 3.9 04/12/2018 1028   K 4.0 03/24/2017 1009   CL 106 04/12/2018 1028   CL 108 (H) 08/30/2012 0903   CO2 24 04/12/2018 1028   CO2 27 03/24/2017 1009   GLUCOSE 113 (H) 04/12/2018 1028   GLUCOSE 96 03/24/2017 1009   GLUCOSE 96 08/30/2012 0903   BUN 10 04/12/2018 1028   BUN 12.2 03/24/2017 1009   CREATININE 0.79 04/12/2018 1028   CREATININE 0.7 03/24/2017 1009   CALCIUM 9.1 04/12/2018 1028   CALCIUM 8.8 03/24/2017 1009   PROT 6.5 04/12/2018 1028   PROT 6.1 (L) 03/24/2017 1009   ALBUMIN 3.4 (L) 04/12/2018 1028   ALBUMIN 3.6 03/24/2017 1009   AST 24 04/12/2018 1028   AST 19 03/24/2017 1009    ALT 24 04/12/2018 1028   ALT 14 03/24/2017 1009   ALKPHOS 168 (H) 04/12/2018 1028   ALKPHOS 67 03/24/2017 1009   BILITOT 0.4 04/12/2018 1028   BILITOT <0.22 03/24/2017 1009   GFRNONAA >60 04/12/2018 1028   GFRAA >60 04/12/2018 1028   Lab Results  Component Value Date   WBC 7.6 04/12/2018   NEUTROABS 6.3 04/12/2018   HGB 9.8 (L) 04/12/2018   HCT 31.9 (L) 04/12/2018   MCV 85.8 04/12/2018   PLT 107 (L) 04/12/2018   Imaging:  [place] [facility] Interpretation: I have personally reviewed the CNS images as listed.  My interpretation, in the context of the patient's clinical presentation, is treatment effect vs true progression  Mr Jeri Cos Wo Contrast  Result Date: 04/18/2018 CLINICAL DATA:  Colon cancer.  Brain metastases. EXAM: MRI HEAD WITHOUT AND WITH CONTRAST TECHNIQUE: Multiplanar, multiecho pulse sequences of the brain and surrounding structures were obtained without and with intravenous contrast. CONTRAST:  39mL MULTIHANCE GADOBENATE DIMEGLUMINE 529 MG/ML IV SOLN COMPARISON:  MRI brain 02/13/2018.  Brain 11/15/2011. FINDINGS: BRAIN New Lesions: None. Larger lesions: 3. Enhancing lesion located left occipital lobe has increased from 10 mm to now 24 x 44 x 27 mm, with new or increased edema and mass effect seen on series 11, image 73. This previously represented 2 lesions which have now become a single lesion. Enhancing lesion located medial left parietal lobe has increased from 6 mm to now 10 x 10 mm, with new or increased edema and mass effect seen on series 11, image 106. Stable or Smaller lesions: 2 3 mm enhancing lesion located medial posterior left frontal lobe  and seen on series 11, image 115. 2 mm enhancing lesion located anterior right frontal lobe and seen on series 11, image 111. Other Brain findings: Blood products are associated with the left occipital and medial left parietal lesions. No acute infarct or acute hemorrhage is present. Brainstem and cerebellum are normal. Ventricles  are of normal size apart from some effacement of the left lateral ventricle. Vascular: Flow is present in the major intracranial arteries. Skull and upper cervical spine: The craniocervical junction is normal. Upper cervical spine is within normal limits. Marrow signal is unremarkable. Sinuses/Orbits: The paranasal sinuses and mastoid air cells are clear. The globes and orbits are within normal limits. IMPRESSION: 1. Progression of disease as evidenced by increased size in surrounding T2 signal changes involving lesions in the posterior left occipital lobe and medial posterior left parietal lobe. 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 Tumor progression is favored. 2. Total of 4 enhancing brain metastases, each annotated on 11. 3. Although remote blood products are associated with presumably treated lesions in the left occipital and parietal lobes, no acute hemorrhage is present. Electronically Signed   By: San Morelle M.D.   On: 04/18/2018 14:28    Assessment/Plan 1. Brain metastasis ([place])  2. Focal seizures [facility])  Mr. Ganim has clinical and radiographic progression today.  His case was reviewed in detail in Brain and Spine Tumor Board today.  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  He is not a candidate for avastin because of prior hemoptysis.    Recommendation was made for referral/consultation with Dr. Brett Albino at [facility] for LITT therapy.     He should continue Lamictal to 100mg  BID for seizures.  Systemic management and treatment is pending review of CT scans later this week by Dr. Benay Spice.  Response to FOLFIRI will be assessed at that time.  Of note, his most recently treated metastasis is no longer visible in the left periventricular space.   We appreciate the opportunity to participate in the care of OLUWATOBI VISSER.  He should reach out to [place] via phone to update [place] on status of LITT  referral.  All questions were answered. The patient knows to call the clinic with any problems, questions or concerns. No barriers to learning were detected.  The total time spent in the encounter was 40 minutes and more than 50% was on counseling and review of test results   Ventura Sellers, MD Medical Director of Neuro-Oncology [facility] at [place] 04/19/18 11:07 AM

## 2018-04-20 ENCOUNTER — Encounter: Payer: Self-pay | Admitting: Nurse Practitioner

## 2018-04-20 ENCOUNTER — Inpatient Hospital Stay (HOSPITAL_BASED_OUTPATIENT_CLINIC_OR_DEPARTMENT_OTHER): Payer: BC Managed Care – PPO | Admitting: Nurse Practitioner

## 2018-04-20 VITALS — BP 134/89 | HR 83 | Temp 98.4°F | Resp 20 | Ht 70.0 in | Wt 153.6 lb

## 2018-04-20 DIAGNOSIS — R0609 Other forms of dyspnea: Secondary | ICD-10-CM

## 2018-04-20 DIAGNOSIS — J9819 Other pulmonary collapse: Secondary | ICD-10-CM

## 2018-04-20 DIAGNOSIS — R911 Solitary pulmonary nodule: Secondary | ICD-10-CM

## 2018-04-20 DIAGNOSIS — C787 Secondary malignant neoplasm of liver and intrahepatic bile duct: Secondary | ICD-10-CM

## 2018-04-20 DIAGNOSIS — Z5111 Encounter for antineoplastic chemotherapy: Secondary | ICD-10-CM | POA: Diagnosis not present

## 2018-04-20 DIAGNOSIS — C20 Malignant neoplasm of rectum: Secondary | ICD-10-CM | POA: Diagnosis not present

## 2018-04-20 DIAGNOSIS — R05 Cough: Secondary | ICD-10-CM

## 2018-04-20 DIAGNOSIS — C7931 Secondary malignant neoplasm of brain: Secondary | ICD-10-CM | POA: Diagnosis not present

## 2018-04-20 NOTE — Progress Notes (Addendum)
[place] OFFICE PROGRESS NOTE   Diagnosis:  Rectal cancer  INTERVAL HISTORY:   Jacob [name] returns as scheduled.  He notes improvement in breathing with the steroid inhaler.  He does continue to have dyspnea on exertion.  Occasional cough.  No recent hemoptysis.  Intermittent chest pain.  He has "good and bad days" with his voice.  No issues with the colostomy.  Some improvement in vision.  Objective:  Vital signs in last 24 hours:  Blood pressure 134/89, pulse 83, temperature 98.4 F (36.9 C), temperature source Oral, resp. rate 20, height '5\' 10"'  (1.778 m), weight 153 lb 9.6 oz (69.7 kg), SpO2 99 %.    HEENT: No thrush or ulcers. Resp: Decreased breath sounds left chest.  No respiratory distress. Cardio: Regular rate and rhythm. GI: No hepatomegaly.  Left lower quadrant colostomy. Vascular: No leg edema. Neuro: Alert and oriented.  Left ptosis. Skin: Palms without erythema. Port-A-Cath without erythema.   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 EXAM: CT CHEST, ABDOMEN, AND PELVIS WITH [name] TECHNIQUE: Multidetector CT imaging of the chest, abdomen and pelvis was performed following the standard protocol during bolus administration of intravenous [name]. [name]:  152m OMNIPAQUE IOHEXOL 300 MG/ML  SOLN COMPARISON:  CT 01/17/2018 FINDINGS: CT CHEST FINDINGS Cardiovascular: LEFT perihilar mass completely constricts the RIGHT main pulmonary artery. Port in the anterior chest wall with tip in distal SVC. Mediastinum/Nodes: No axillary or supraclavicular adenopathy. Bulky mediastinal adenopathy similar to prior. Lungs/Pleura: The LEFT hilar mass completely obstructs  the LEFT mainstem bronchus which is progressed from comparison exam. The entire LEFT upper lobe and LEFT lower lobe are collapsed. The entire LEFT hemithorax is fluid-filled. Mild hyperexpansion of the RIGHT lung. Small nodule in the RIGHT lower lobe measuring 6 mm (image 126/7) likely represents metastatic deposit. Small nodule in the RIGHT lower lobe posterir to the fissure measuring 5 mm on image 9/7). Musculoskeletal: No aggressive osseous lesion CT ABDOMEN PELVIS FINDINGS Hepatobiliary: Lesion in the caudate lobe measures 5.5 x 4.0 cm compared to 5.7 x 4.3 cm. Lesion the posterior aspect the RIGHT hepatic lobe measures 4.0 x 3.4 compared to 4.0 x 2.5 cm. Intervening lesion between these two lesions measures 2.1 cm (image 55/2) which is new from prior. No biliary duct dilatation. Partially thrombosed RIGHT portal vein unchanged. Pancreas: No pancreatic metastasis Spleen: No splenic attached Adrenals/Urinary Tract: RIGHT adrenal gland metastasis measuring 3 cm in with compared to 3 cm. New LEFT adrenal gland metastasis. Kidneys, ureters bladder normal Stomach/Bowel: Stomach, small-bowel normal. LEFT abdominal wall colostomy. Moderate volume stool proximal colostomy without high-grade obstruction. Post rectosigmoid resection. Vascular/Lymphatic: Peri aortic lymph nodes are increased. For example lymph node beneath the SMA measuring 13 mm increased from 4 mm (image 70/2). No pelvic lymphadenopathy. Reproductive: Prostate gland normal. Other: No peritoneal metastasis. Musculoskeletal: No aggressive osseous lesion. IMPRESSION: Chest Impression: 1. Progression of the LEFT hilar mass with now complete obstruction of the LEFT mainstem bronchus. Complete collapse of the LEFT upper lobe and LEFT lower lobe with fluid filling the entire LEFT hemithorax. 2. Bulky mediastinal adenopathy unchanged. 3. Small pulmonary nodules in the RIGHT lung consistent  with metastasis. (Two small nodules noted). Abdomen / Pelvis Impression:  1. Interval mild increase in tumor burden in the RIGHT hepatic lobe. 2. Increase in upper abdominal periaortic adenopathy. 3. New LEFT adrenal gland metastasis. 4. Stable RIGHT adrenal gland metastasis. 5. Proctocolectomy without evidence local recurrence. These results will be called to the ordering clinician or representative by the Radiologist Assistant, and communication documented in the PACS or zVision Dashboard. Electronically Signed   By: Suzy Bouchard M.D.   On: 04/19/2018 17:08   Jacob [name]  Result Date: 04/18/2018 CLINICAL DATA:  Colon cancer.  Brain metastases. EXAM: MRI HEAD WITHOUT AND WITH [name] TECHNIQUE: Multiplanar, multiecho pulse sequences of the brain and surrounding structures were obtained without and with intravenous [name]. [name]:  59m MULTIHANCE GADOBENATE DIMEGLUMINE 529 MG/ML IV SOLN COMPARISON:  MRI brain 02/13/2018.  Brain 11/15/2011. FINDINGS: BRAIN New Lesions: None. Larger lesions: 3. Enhancing lesion located left occipital lobe has increased from 10 mm to now 24 x 44 x 27 mm, with new or increased edema and mass effect seen on series 11, image 73. This previously represented 2 lesions which have now become a single lesion. Enhancing lesion located medial left parietal lobe has increased from 6 mm to now 10 x 10 mm, with new or increased edema and mass effect seen on series 11, image 106. Stable or Smaller lesions: 2 3 mm enhancing lesion located medial posterior left frontal lobe and seen on series 11, image 115. 2 mm enhancing lesion located anterior right frontal lobe and seen on series 11, image 111. Other Brain findings: Blood products are associated with the left occipital and medial left parietal lesions. No acute infarct or acute hemorrhage is present. Brainstem and cerebellum are normal. Ventricles are of normal size apart from some effacement of the left lateral ventricle. Vascular: Flow is present in the major intracranial arteries. Skull and  upper cervical spine: The craniocervical junction is normal. Upper cervical spine is within normal limits. Marrow signal is unremarkable. Sinuses/Orbits: The paranasal sinuses and mastoid air cells are clear. The globes and orbits are within normal limits. IMPRESSION: 1. Progression of disease as evidenced by increased size in surrounding T2 signal changes involving lesions in the posterior left occipital lobe and medial posterior left parietal lobe. 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 Tumor progression is favored. 2. Total of 4 enhancing brain metastases, each annotated on 11. 3. Although remote blood products are associated with presumably treated lesions in the left occipital and parietal lobes, no acute hemorrhage is present. Electronically Signed   By: CSan MorelleM.D.   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 EXAM: CT CHEST, ABDOMEN, AND PELVIS WITH [name] TECHNIQUE: Multidetector CT imaging of the chest, abdomen and pelvis was performed following the standard protocol during bolus administration of intravenous [name]. [name]:  1045mOMNIPAQUE IOHEXOL 300 MG/ML  SOLN COMPARISON:  CT 01/17/2018 FINDINGS: CT CHEST FINDINGS Cardiovascular: LEFT perihilar mass completely constricts the RIGHT main pulmonary artery. Port in the anterior chest wall with tip in distal SVC. Mediastinum/Nodes: No axillary or supraclavicular adenopathy. Bulky mediastinal adenopathy similar to prior. Lungs/Pleura: The LEFT hilar mass completely obstructs the LEFT mainstem bronchus which is progressed from comparison exam. The entire LEFT upper lobe and LEFT lower lobe are collapsed. The entire LEFT hemithorax is fluid-filled. Mild hyperexpansion of the RIGHT lung. Small nodule in  the RIGHT lower lobe measuring 6 mm (image 126/7) likely represents metastatic deposit. Small  nodule in the RIGHT lower lobe posterir to the fissure measuring 5 mm on image 9/7). Musculoskeletal: No aggressive osseous lesion CT ABDOMEN PELVIS FINDINGS Hepatobiliary: Lesion in the caudate lobe measures 5.5 x 4.0 cm compared to 5.7 x 4.3 cm. Lesion the posterior aspect the RIGHT hepatic lobe measures 4.0 x 3.4 compared to 4.0 x 2.5 cm. Intervening lesion between these two lesions measures 2.1 cm (image 55/2) which is new from prior. No biliary duct dilatation. Partially thrombosed RIGHT portal vein unchanged. Pancreas: No pancreatic metastasis Spleen: No splenic attached Adrenals/Urinary Tract: RIGHT adrenal gland metastasis measuring 3 cm in with compared to 3 cm. New LEFT adrenal gland metastasis. Kidneys, ureters bladder normal Stomach/Bowel: Stomach, small-bowel normal. LEFT abdominal wall colostomy. Moderate volume stool proximal colostomy without high-grade obstruction. Post rectosigmoid resection. Vascular/Lymphatic: Peri aortic lymph nodes are increased. For example lymph node beneath the SMA measuring 13 mm increased from 4 mm (image 70/2). No pelvic lymphadenopathy. Reproductive: Prostate gland normal. Other: No peritoneal metastasis. Musculoskeletal: No aggressive osseous lesion. IMPRESSION: Chest Impression: 1. Progression of the LEFT hilar mass with now complete obstruction of the LEFT mainstem bronchus. Complete collapse of the LEFT upper lobe and LEFT lower lobe with fluid filling the entire LEFT hemithorax. 2. Bulky mediastinal adenopathy unchanged. 3. Small pulmonary nodules in the RIGHT lung consistent with metastasis. (Two small nodules noted). Abdomen / Pelvis Impression: 1. Interval mild increase in tumor burden in the RIGHT hepatic lobe. 2. Increase in upper abdominal periaortic adenopathy. 3. New LEFT adrenal gland metastasis. 4. Stable RIGHT adrenal gland metastasis. 5. Proctocolectomy without evidence local recurrence. These results will be called to the ordering clinician or  representative by the Radiologist Assistant, and communication documented in the PACS or zVision Dashboard. Electronically Signed   By: Suzy Bouchard M.D.   On: 04/19/2018 17:08    Medications: I have reviewed the patient's current medications.  Assessment/Plan: 1. Rectal cancer. Partially obstructing mass noted 1-2 cm from the anal verge on a colonoscopy 03/06/2012. Endoscopic ultrasound 03/14/2012 with a 7.5 mm thick, 3.2 cm wide hypoechoic, irregularly bordered mass that clearly passed into and through the muscularis propria layer of the distal rectal wall (uT3); 3 small (largest 7 mm) perirectal lymph nodes. The lymph nodes were all round, discrete, hypoechoic, homogenous; suspicious for malignant involvement (uN1).  No RAS mutation identified by [facility] testing on the colon resection specimen 06/29/2012, APC alteration identified, microsatellite stable  He began radiation and concurrent Xeloda chemotherapy on 03/20/2012, completed 04/27/2012.   Low anterior resection/coloanal anastomosis and diverting ileostomy 06/29/2012 with the final pathology revealing a T2N0 tumor with extensive fibrosis and negative margins.   Cycle 1 of adjuvant CAPOX 07/19/2012. Cycle 5 of adjuvant CAPOX 10/11/2012.   CEA 2.5 on 11/14/2012.   CEA 14.6 03/08/2013.   Restaging CT evaluation 03/08/2013 with a 4 mm pulmonary nodule left lung base not identified on comparison exam; new liver lesions including a 29 x 26 mm irregular peripheral enhancing rounded lesion in the dome of the right hepatic lobe, a less well-defined new subcapsular lesion in the lateral right hepatic lobe measuring 12 mm, a new subcapsular lesion in the anterior right hepatic lobe adjacent to the gallbladder fossa measuring 10 mm; a rounded low-density lesion in the inferior right hepatic lobe measuring 10 mm compared to 7 mm on the prior study (radiologist commented this may represent an enlarged cyst).   MRI of  the abdomen  04/12/2013 confirmed multiple T2 hyperintense metastatic lesions throughout the right liver   Initiation of FOLFIRI/Avastin with genotype based irinotecan dosing per the [facility] study 04/05/2013.   Restaging CT evaluation on 06/20/2013 (after 2 cycles/4 treatments) showed improvement in the liver metastases and stable size of a left lower lobe pulmonary nodule now with central cavitation.   Continuation of FOLFIRI/Avastin.   Restaging CT evaluation 08/14/2013-decrease in the size of liver metastases, slight decrease in the size of a cavitary left lower lobe nodule, no evidence of disease progression.   Status post right hepatic lobectomy 09/17/2013. Pathology showed multiple foci of metastatic adenocarcinoma (5 nodules of metastatic adenocarcinoma 4 of which are subcapsular with the nodules ranging in size from 0.6-1.8 cm in greatest dimension). Margins not involved. Biopsy of a portal lymph node showed benign adipose tissue; no lymph node tissue or malignancy.   Normal CEA 11/06/2013   CT 11/06/2013 with a new pleural-based right lower chest lesion, slight in enlargement of the a left-sided lung nodule  CT 01/29/2014 with a decrease in the right lower chest pleural-based lesion and a slight increase of a left-sided lung lesion, other lung lesions were stable  CT chest 05/02/2014 with a decrease in the size of a right lower lobe pulmonary nodule months similar size of a dominant left lower lobe nodule, minimal enlargement of a smaller left lower lobe nodule, no new site of disease  CT chest 11/07/2014 with a slight increase in several left-sided lung nodules  CT chest 02/10/2015 with new small left hilar lymph nodes, a possible new left lower lobe nodule, and stability of other lung nodules  PET scan 03/12/2015 with hypermetabolic left hilar nodes, hypermetabolic left lower lobe nodules, hyper metabolic retroperitoneal nodes, intense hypermetabolism at the coloanal anastomosis, and  hypermetabolic thickening in the presacral space  Cycle 1 FOLFIRI/PANITUMUMAB 05/21/2015  Cycle 2 FOLFIRI/PANITUMUMAB 06/05/2015  Cycle 3 FOLFIRI/PANITUMUMAB 06/19/2015  Cycle 4 FOLFIRI 07/10/2015  Cycle 5 FOLFIRI 07/24/2015  Restaging CTs 08/06/2015-resolution of hilar/retroperitoneal adenopathy, improvement in the hypermetabolic lung nodule, other lung nodules are stable, no new lesions  Cycle 6 FOLFIRI with PANITUMUMAB 08/28/2015 -panitumumab dose reduced  Cycle 7 FOLFIRI 09/11/2015-no panitumumab given  Cycle 8 FOLFIRI with PANITUMUMAB 09/25/2015-PANITUMUMAB dose reduced  Cycle 9 FOLFIRI 10/09/2015-no PANITUMUMAB given  Cycle 10 FOLFIRI with panitumumab 10/23/2015  CTs 11/06/2015-possible slight enlargement of a left lower lobe nodule, no other evidence of disease progression.  CTs 02/16/2016-enlargement of left-sided pulmonary nodules and mediastinal/left hilar nodes, improved splenomegaly  CTs 06/21/2016-increased size of mediastinal/left hilar nodes, increased left lung nodules, and increased soft tissue at the porta hepatis  CTs 10/28/2016-progressive disease in the chest and abdomen with an enlarging left hilar mass and slight interval enlargement of pulmonary lesions. Right upper quadrant necrotic nodal mass significantly increased in size with mass effect on the liver and possible invasion. Significant compression of the intrahepatic IVC. New right hepatic lobe lesion. Moderate pelvic ascites.  Cycle 1 FOLFIRI/PANITUMUMAB 11/04/2016  Cycle 2 FOLFIRI/PANITUMUMAB 11/18/2016  Cycle 3 FOLFIRI/panitumumab 12/02/2016  Cycle4 FOLFIRI/PANITUMUMAB 12/15/2016  Cycle 5 FOLFIRI/PANITUMUMAB 12/30/2016  CTs 01/06/2017-no evidence of progressive disease, decreased chest adenopathy, stable left lower lobe nodules, decreased liver metastasis, decreased right retroperitoneal mass  Cycle 6 FOLFIRI/panitumumab 01/13/2017  Cycle 7 FOLFIRI/Panitumumab 01/27/2017  Cycle 8  FOLFIRI/panitumumab 02/17/2017  Cycle 9 FOLFIRI/Panitumumab 03/03/2017  Cycle 10 FOLFIRI/Panitumumab 03/24/2017  CTs 04/06/2017-enlargement of right adrenal mass (smaller than October 2019), stable left hilar fullness and lung nodules  Cycle 11 FOLFIRI/Panitumumab 04/07/2017  Cycle 12 FOLFIRI/panitumumab 04/21/2017  Cycle 13 FOLFIRI/Panitumumab 05/05/2017  Palliative radiation to the right adrenal mass 226 2019-3 02/2018  Brain MRI 07/28/2017-4intracranial metastasis in the right frontal lobe, left parietal lobe and left occipital lobe. Surrounding edema pronounced in the left occipital lobe.  SBRT to for brain lesions on 08/12/2017  CTs 08/10/2017- new left upper lobe airspace process, stable lung nodules and medius and lymphadenopathy, decreased mass involving the adrenal gland with progression of a necrotic anterior portion of the mass, no liver lesions  Cycle 14 FOLFIRI/panitumumab 08/19/2017, irinotecan dose reduced secondary to thrombocytopenia, Panitumumab dose escalated  Cycle 15 FOLFIRI/panitumumab 09/01/2017) Panitumumab dose reduced secondary to an early rash following cycle 14  CT chest 10/24/2017- progression of disease in the left lung; mild progression of left hilar nodal disease with new right hilar lymphadenopathy and progressive increase in lymph nodes in the mediastinum; interval progression of caudate lobe metastatic lesion.  Cycle 1 FOLFIRI/Panitumumab 11/03/2017  Cycle 2 FOLFIRI/Panitumumab 11/17/2017  Cycle 3 FOLFIRI/panitumumab 12/08/2017 (Panitumumab dose increased)  Cycle 4 FOLFIRI/Panitumumab 12/22/2017  Cycle 5 FOLFIRI/Panitumumab 01/05/2018  CTs 01/17/2018- new 4 cm posterior liver mass. Mild growth of separate heterogeneous 5.9 cm mass posterior margin of the liver near the IVC. Newbandlike low-attenuation focus in the posterior inferior liver, indeterminate, favoring expansile right portal vein thrombus. Stable right adrenal metastasis. Mixed response in the  chest. Left hilar/AP window adenopathy mildly increased. Right paratracheal adenopathy mildly increased. Right hilar adenopathy decreased. Left lower lobe pulmonary nodule stable to mildly decreased. Tiny right upper lobe pulmonary nodule slightly increased. New 2.7 cm sub-solid pulmonary nodule basilar right lower lobe. Waxing and waning left upper lobe nodular foci of consolidation. New mild splenomegaly.  Cycle 1 FOLFOX 02/02/2018  Brain MRI 02/13/2018-new left frontal parietal lesion, mild progression of enhancing lesion in the left occipital lobe-previously treated lesion  Cycle 2 FOLFOX 02/24/2018  SRS to 1 brain lesion 03/02/2018  Cycle 3 FOLFOX 03/09/2018  Cycle 4 FOLFOX 03/28/2018  MRI brain 04/18/2018- progression of disease with increased size in surrounding T2 signal changes involving lesions in the posterior left occipital lobe and medial posterior left parietal lobe.  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  CTs 04/19/2018- progression of left hilar mass with complete obstruction of the left mainstem bronchus.  Complete collapse of the left upper lobe and left lower lobe with fluid filling the entire left hemithorax.  Unchanged bulky mediastinal adenopathy.  2 small nodules right lung consistent with metastasis.  Interval mild increase in tumor burden right hepatic lobe.  Increase in upper abdominal periaortic adenopathy.  New left adrenal gland metastasis.  Stable right adrenal gland metastasis. 2. History ofIrregular bowel habits/rectal bleeding secondary to #1. 3. History of Mild elevation of the liver enzymes. Question secondary to hepatic steatosis. 4. Indeterminate 8 mm posterior right liver lesion on the staging CT 03/06/2012. 5. Mildly elevated CEA at 5.7 on 03/06/2012. 6. History of radiation erythema at the groin and perineum 7. 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 No clinical  evidence of thrombophlebitis or extravasation. 8. Delayed nausea following chemotherapy-Decadron prophylaxis was added with cycle 3 CAPOX-improved. 9. Ileostomy takedown 12/07/2012. 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 11. Port-A-Cath placement 12/31/204 12. Severe neutropenia secondary to chemotherapy following cycle 1 of FOLFIRIin 2015, chemotherapy was dose reduced and he received Neulasta with day 15 cycle 1  13. Nausea and vomiting following cycle 1 of FOLFIRI 14. Rectal stricture-manual/colonoscopic dilatation by Dr. Ardis Hughs 03/01/2016  APR 08/17/2016-no evidence  of malignancy 15. History ofLeukopenia/Thrombocytopenia-persistent, potentially a sequelae of chemotherapy or hepatic toxicity from chemotherapy/radiation. Bone marrow biopsy 11/20/2014 showed cellular bone marrow with trilineage hematopoiesis. Significant dyspoiesis was not present and there was no evidence of metastatic carcinoma. Cytogenetic analysis showed the presence of normal male chromosomes with no observable clonal chromosomal abnormalities.  probable cirrhosis 16. Genetic testing-negative genetic panel in March 2014 17. 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 Severe over the face-steroid Dosepak prescribed 07/03/2015. Improved 07/10/2015. Further improved 07/24/2015, 08/07/2015, 08/28/2015 18. Diarrhea secondary to chemotherapy-encouraged to use Imodium  19. History of Paronychia secondary to PANITUMUMAB 20. 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 History of neutropenia, thrombocytopenia 06/23/2017. Possibly related to radiation. He also has a history of suspected portal hypertension secondaryto toxicity from chemotherapy 22. Seizure in the setting of brain metastases 08/05/2017, now maintained onLamictal 23. Altered mental status evaluated by neuro-oncology, Keppra  discontinued, improved 24. 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  We reviewed the CT report/images with Jacob [name] and his wife at today's visit.  They understand the scans indicate progression.  Dr. Benay Spice recommends discontinuation of FOLFOX and discussed other treatment options to include a trial of Lonsurf, referral to [facility] for clinical trial availability.  He would like to be seen at [facility].  We will initiate a referral.  He has dyspnea and a cough secondary to progressive disease in the left chest with complete obstruction of the left mainstem bronchus.  We made an urgent referral to radiation oncology.    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 Brett Albino at [facility] for LITT therapy.  He will return for a follow-up visit here in approximately 2 weeks.  He will contact the office in the interim with any problems.  Patient seen with Dr. Benay Spice.  25 minutes were spent face-to-face at today's visit with the majority of that time involved in counseling/coordination of care.    Ned Card ANP/GNP-BC   04/20/2018  9:17 AM  This was a shared visit with Ned Card.  Jacob [name] has a history of metastatic rectal cancer with his initial diagnosis dating to December 2013 metastatic disease diagnosed in December 2014.  The restaging CT confirms disease progression while being treated with FOLFOX.  He did not receive Avastin secondary to hemoptysis and brain metastases.  I discussed treatment options with Jacob [name] and his wife.  We reviewed the CT images.  He has collapse of the left lung secondary to central tumor in the left chest.  He is symptomatic.  We referred him to radiation to consider palliative radiation to the central left chest.  He understands stomach treatment options are limited.  He has previously been seen at [facility] to consider a clinical trial.  He has been referred to neurosurgery  at [facility] for a LITT procedure for treatment of brain radiation necrosis.  I will make a referral to the GI oncology team at [facility] to see if he is eligible for a clinical trial there. Julieanne Manson, MD

## 2018-04-21 ENCOUNTER — Telehealth: Payer: Self-pay | Admitting: Nurse Practitioner

## 2018-04-21 ENCOUNTER — Encounter: Payer: Self-pay | Admitting: *Deleted

## 2018-04-21 ENCOUNTER — Other Ambulatory Visit: Payer: Self-pay | Admitting: Nurse Practitioner

## 2018-04-21 ENCOUNTER — Telehealth: Payer: Self-pay

## 2018-04-21 DIAGNOSIS — C787 Secondary malignant neoplasm of liver and intrahepatic bile duct: Principal | ICD-10-CM

## 2018-04-21 DIAGNOSIS — C20 Malignant neoplasm of rectum: Secondary | ICD-10-CM

## 2018-04-21 NOTE — Telephone Encounter (Signed)
Scheduled appt per 1/30 los - pt is aware of apt date and time

## 2018-04-21 NOTE — Telephone Encounter (Signed)
Per 1/29 no los

## 2018-04-24 NOTE — Progress Notes (Signed)
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  No recent hemoptysis, and intermittent chest pain.  CT CAP 04/19/2018: Left perihilar mass completely constricts the right main pulmonary artery. The left hilar mass completely obstructs the left mainstem bronchus which is progressed from comparison exam. The entire left upper lobe and left lower lobe are collapsed.  The entire left hemithorax is fluid filled.  Small nodule in the right lower lobe measuring 6 mm likely represents metastatic deposit.  Small nodule in the right lower lobe posterior to the fissure measuring 5 mm.  Hepatobiliary: lesion in the caudate lobe measures 5.5 x 4.0 cm compared to 5.7 x 4.3 cm.  Lesion in the posterior aspect of the right hepatic lobe measures 4.0 x 3.4 compared to 4.0 x 2.5 cm.   Intervening lesion between these two lesions measures 2.1 cm which is new from prior.  Adrenals: right adrenal gland metastasis measuring 3 cm in width compared to 3 cm.  New left adrenal gland metastasis.  MRI Brain 04/18/2018: enhancing lesion located left occipital lobe has increased from 10 mm to now 24 x 44 x 27 mm with new or increased edema and mass effect.  This previously represented 2 lesions which have now become a single lesion.  Enhancing lesion located medial left parietal lobe has increased from 6 mm to now 10 x 10 mm with new or increased edema and mass effect.  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  We reviewed the CT report, they understand the scans indicate progression. Dr. Benay Spice recommends discontinuation of FOLFOX and discussed other treatment options to include a trial of Lonsurf, referral to [facility] for  clinical trial availability. -He would like to be seen at [facility], we will initiate a referral. -He has dyspnea and a cough secondary to progressive disease in the left chest with complete obstruction of the left mainstem bronchus.  We made an urgent referral to radiation oncology. 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 Brett Albino at [facility] for LITT therapy. Dr. Benay Spice 04/20/2018 -This was a shared visit with Ned Card.  Mr. Halbert has a history of metastatic rectal cancer with his initial diagnosis dating to December 2013 metastatic disease diagnosed in December 2014.   -The restaging CT confirms disease progression while being treated with FOLFOX.  He did not receive Avastin secondary to hemoptysis and brain metastases. -I discussed treatment options with Mr. Ida and his wife.  We reviewed the CT images.  He has collapse of the left lung secondary to central tumor in the left chest.  He is symptomatic.  We referred him to radiation to consider palliative radiation to the central left chest. -He understands stomach treatment options are limited.  He has previously been seen at [facility] to consider a clinical trial.  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  I will make a referral to the GI oncology team at [facility] to see if he is eligible for a clinical trial there.  Dr. Mickeal Skinner 04/19/2018 Wille Glaser presents following recent MRI brain.  Unfortunately he describes "creeping" of visual loss on the right side, including some episodes of "flashing lights" on the right.  He has also experienced headaches although those have tailed off with the steroids.  He did have an episode where he was "unable to read writing on a page of text".  That appears to have resolved but was very concerning to him.  No further clinical seizures aside from lights phenomena.   -[name] has clinical and radiographic progression today.  His case was  reviewed in detail in Brain and Spine Tumor Board today. 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  He is not a candidate for avastin because of prior hemoptysis.   -Recommendation was made for referral/consultation with Dr. Brett Albino at [facility] for LITT therapy.    -He should continue Lamictal to 100mg  BID for seizures. -Systemic management and treatment is pending review of CT scans later this week by Dr. Benay Spice.  Response to FOLFIRI will be assessed at that time. -Of note, his most recently treated metastasis is no longer visible in the left periventricular space.    Signs/Symptoms  Weight changes, if any: Has gained some weight recently, he is on steroids.  Respiratory complaints, if any: Wheezing is better after starting inhaler.  SOB with activities, sometimes at rest.  Hemoptysis, if any: Occasional non productive cough.  No blood recently, 1.5 months ago he was coughing up blood.  Pain issues, if any: No  BP 115/70 (BP Location: Left Arm, Patient Position: Sitting)   Pulse 98   Temp 98 F (36.7 C) (Oral)   Resp 18   Ht 5\' 10"  (1.778 m)   Wt 155 lb 8 oz (70.5 kg)   SpO2 99%   BMI 22.31 kg/m    Wt Readings from Last 3 Encounters:  04/25/18 155 lb 8 oz (70.5 kg)  04/20/18 153 lb 9.6 oz (69.7 kg)  04/19/18 154 lb 4.8 oz (70 kg)    SAFETY ISSUES:  Prior radiation? Rectum 2013-2014 28 fx, Brain 02/2018, R adrenal gland April 2019  Pacemaker/ICD? No  Possible current pregnancy? No  Is the patient on methotrexate? No  Current Complaints / other details:   History -Initial diagnosis 03/17/2012: malignant neoplasm of rectum. -Radiation therapy 03/20/2012-04/27/2012: RT for 28 fractions with xeloda. Brain 02/2018, R adrenal 06/2017 -Initial Diagnosis 09/17/2013: rectal cancer metastasized to liver. -Chemotherapy 04/30/2015-01/18/2018: Abe People, Udenyca, Camptosar, leucovorin, Adrucil,  Emend. -Chemotherapy 02/02/2018: Jimmy Footman, leucovorin, Eloxatin, Adrucil.

## 2018-04-25 ENCOUNTER — Encounter: Payer: Self-pay | Admitting: Radiation Oncology

## 2018-04-25 ENCOUNTER — Ambulatory Visit
Admission: RE | Admit: 2018-04-25 | Discharge: 2018-04-25 | Disposition: A | Discharge status: Home / Self Care | Payer: BC Managed Care – PPO | Source: Ambulatory Visit | Attending: Radiation Oncology | Admitting: Radiation Oncology

## 2018-04-25 ENCOUNTER — Other Ambulatory Visit: Payer: Self-pay

## 2018-04-25 VITALS — BP 115/70 | HR 98 | Temp 98.0°F | Resp 18 | Ht 70.0 in | Wt 155.5 lb

## 2018-04-25 DIAGNOSIS — C7802 Secondary malignant neoplasm of left lung: Secondary | ICD-10-CM | POA: Diagnosis not present

## 2018-04-25 DIAGNOSIS — C20 Malignant neoplasm of rectum: Secondary | ICD-10-CM

## 2018-04-25 DIAGNOSIS — Z923 Personal history of irradiation: Secondary | ICD-10-CM | POA: Diagnosis not present

## 2018-04-25 DIAGNOSIS — C7800 Secondary malignant neoplasm of unspecified lung: Secondary | ICD-10-CM

## 2018-04-25 NOTE — Progress Notes (Addendum)
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 A diagnosis of Secondary malignant neoplasm of left lung (HCC) was also pertinent to this visit.   HISTORY OF PRESENT ILLNESS::Jacob [name] is a 48 y.o. male who is seen for an initial consultation visit regarding the patient's diagnosis of metastatic rectal cancer with pulmonary metastasis.  The patient has been recently seen at [facility] for consideration of litt therapy for some enlargement of brain metastasis, radiation necrosis versus progression.  However, given systemic findings this has not been planned as of yet.  He is also going to be seen for consideration of additional systemic treatment.  Recent imaging has shown enlargement of a left hilar mass with obstruction of the left lung and collapse on this side.  The patient is having significant shortness of breath he states which is his dominant complaint today.  I been asked to see him for consideration of palliative radiation treatment to this region.    PREVIOUS RADIATION THERAPY: Yes; pelvic radiation treatment, stereotactic body radiation treatment to the right adrenal gland, radiosurgery to brain metastasis   PAST MEDICAL HISTORY:  has a past medical history of Allergic state (01/19/2012), Anemia, Cancer ([place]) (03/06/12), Chicken pox (as a child), Dysrhythmia (as child), Elevated liver function tests (01/19/2012), Fatty liver, Hernia (6 months old), History of diarrhea, migraines, Lung abnormality (2015), Lung nodule (08/2013), Lung nodule, multiple (11/29/2013), Migraine headache (as a child), Numbness, Overweight(278.02) (01/19/2012), Pain, Peripheral neuropathy, 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     PAST SURGICAL HISTORY: Past Surgical History:  Procedure Laterality Date  . CHOLECYSTECTOMY N/A 09/17/2013   Procedure: CHOLECYSTECTOMY;  Surgeon: Stark Klein, MD;  Location: [place];  Service: General;  Laterality: N/A;  . COLOSTOMY TAKEDOWN N/A 08/17/2016   Procedure: LAPAROSCOPIC ABDOMINOPERINEAL RESECTION WITH PERMANENT COLOSTOMY;  Surgeon: Leighton Ruff, MD;  Location: [facility];  Service: General;  Laterality: N/A;  . CYST EXCISION     L EAR AREA  . EUS  03/14/2012   Procedure: LOWER ENDOSCOPIC ULTRASOUND (EUS);  Surgeon: Milus Banister, MD;  Location: [facility];  Service: Endoscopy;  Laterality: N/A;  . HERNIA REPAIR  6 months old   right inguinal repair  . ILEOSTOMY CLOSURE N/A 12/07/2012   Procedure: ILEOSTOMY TAKEDOWN;  Surgeon: Leighton Ruff, MD;  Location[facility];  Service: General;  Laterality: N/A;  . LAPAROSCOPIC LOW ANTERIOR RESECTION N/A 06/29/2012   Procedure: LAPAROSCOPIC LOW ANTERIOR RESECTION, mobilization splenic flexure,coloanal anastomosis,diverting ileostomy;  Surgeon: Leighton Ruff, MD;  Location: [facility];  Service: General;  Laterality: N/A;  . LAPAROSCOPY N/A 09/17/2013   Procedure: LAPAROSCOPY DIAGNOSTIC;  Surgeon: Stark Klein, MD;  Location: [place];  Service: General;  Laterality: N/A;  . LARYNGOPLASTY Left 02/10/2018   Procedure: LARYNGOPLASTY;  Surgeon: Melissa Montane, MD;  Location: [facility];  Service: ENT;  Laterality: Left;  . LIVER ULTRASOUND N/A 09/17/2013   Procedure: LIVER ULTRASOUND;  Surgeon: Stark Klein, MD;  Location: [place];  Service: General;  Laterality: N/A;  . MICROLARYNGOSCOPY W/VOCAL CORD INJECTION Left 05/16/2017   Procedure: MICROLARYNGOSCOPY WITH VOCAL CORD INJECTION;  Surgeon:  Melissa Montane, MD;  Location: [place];  Service: ENT;  Laterality: Left;  .  OPEN HEPATECTOMY  N/A 09/17/2013   Procedure: OPEN HEPATECTOMY;  Surgeon: Stark Klein, MD;  Location: [facility];  Service: General;  Laterality: N/A;  . PORTACATH PLACEMENT Left 03/21/2013   Procedure: INSERTION PORT-A-CATH;  Surgeon: Leighton Ruff, MD;  Location[facility];  Service: General;  Laterality: Left;  . RECTAL BIOPSY  03/06/12   distal mass1-2cm from anal verge=Adenocarcinoma  . TONSILLECTOMY       FAMILY HISTORY: family history includes Cancer in his father; Heart disease in his father. He was adopted.   SOCIAL HISTORY:  reports that he has never smoked. He has never used smokeless tobacco. He reports previous alcohol use. He reports that he does not use drugs.   ALLERGIES: Alprazolam and Penicillins   MEDICATIONS:  Current Outpatient Medications  Medication Sig Dispense Refill  . acetaminophen (TYLENOL) 500 MG tablet Take 500-1,000 mg by mouth daily as needed for moderate pain.    . clotrimazole (MYCELEX) 10 MG troche Take 1 lozenge (10 mg total) by mouth 4 (four) times daily. While on decadron 120 lozenge 0  . dexamethasone (DECADRON) 1 MG tablet Take 1 tablet (1 mg total) by mouth daily. (Patient taking differently: Take 4 mg by mouth daily. ) 30 tablet 3  . docusate sodium (STOOL SOFTENER) 100 MG capsule Take 100 mg by mouth 2 (two) times daily as needed (constipation).     Marland Kitchen dronabinol (MARINOL) 5 MG capsule Take 1 capsule (5 mg total) by mouth 3 (three) times daily as needed. (Patient taking differently: Take 5 mg by mouth 3 (three) times daily as needed (appetite). ) 90 capsule 3  . fluticasone (FLOVENT HFA) 110 MCG/ACT inhaler Inhale 2 puffs into the lungs 2 (two) times daily. 1 Inhaler 12  . lamoTRIgine (LAMICTAL) 100 MG tablet TAKE 1 TABLET BY MOUTH TWICE DAILY 60 tablet 0  . lidocaine-prilocaine (EMLA) cream Apply 1 application topically as needed (prior to port access). 30 g 1  . Multiple Vitamin (MULTIVITAMIN WITH MINERALS) TABS tablet Take 1 tablet by mouth 3 (three)  times a week.    . prochlorperazine (COMPAZINE) 10 MG tablet Take 10 mg by mouth every 6 (six) hours as needed for nausea or vomiting.    . sennosides-docusate sodium (SENOKOT-S) 8.6-50 MG tablet Take 1 tablet by mouth daily as needed for constipation.    Marland Kitchen tiZANidine (ZANAFLEX) 4 MG tablet Take 0.5-1 tablets (2-4 mg total) by mouth every 8 (eight) hours as needed for muscle spasms. 30 tablet 1   No current facility-administered medications for this encounter.      REVIEW OF SYSTEMS:  A 15 point review of systems is documented in the electronic medical record. This was obtained by the nursing staff. However, I reviewed this with the patient to discuss relevant findings and make appropriate changes.  Pertinent items are noted in HPI.    PHYSICAL EXAM:  height is 5\' 10"  (1.778 m) and weight is 155 lb 8 oz (70.5 kg). His oral temperature is 98 F (36.7 C). His blood pressure is 115/70 and his pulse is 98. His respiration is 18 and oxygen saturation is 99%.   ECOG = 2  0 - Asymptomatic (Fully active, able to carry on all predisease activities without restriction)  1 - Symptomatic but completely ambulatory (Restricted in physically strenuous activity but ambulatory and able to carry out work of a light or sedentary nature. For example, light  housework, office work)  2 - Symptomatic, <50% in bed during the day (Ambulatory and capable of all self care but unable to carry out any work activities. Up and about more than 50% of waking hours)  3 - Symptomatic, >50% in bed, but not bedbound (Capable of only limited self-care, confined to bed or chair 50% or more of waking hours)  4 - Bedbound (Completely disabled. Cannot carry on any self-care. Totally confined to bed or chair)  5 - Death   Eustace Pen MM, Creech RH, Tormey DC, et al. [phone]). "Toxicity and response criteria of the [facility]". [place] Oncol. 5 (6): 649-55    LABORATORY DATA:  Lab Results  Component Value  Date   WBC 7.6 04/12/2018   HGB 9.8 (L) 04/12/2018   HCT 31.9 (L) 04/12/2018   MCV 85.8 04/12/2018   PLT 107 (L) 04/12/2018   Lab Results  Component Value Date   NA 138 04/12/2018   K 3.9 04/12/2018   CL 106 04/12/2018   CO2 24 04/12/2018   Lab Results  Component Value Date   ALT 24 04/12/2018   AST 24 04/12/2018   ALKPHOS 168 (H) 04/12/2018   BILITOT 0.4 04/12/2018      RADIOGRAPHY: Ct Chest W Contrast  Result Date: 04/19/2018 CLINICAL DATA:  Rectal carcinoma with metastatic disease to the lung and liver. EXAM: CT CHEST, ABDOMEN, AND PELVIS WITH CONTRAST TECHNIQUE: Multidetector CT imaging of the chest, abdomen and pelvis was performed following the standard protocol during bolus administration of intravenous contrast. CONTRAST:  153mL OMNIPAQUE IOHEXOL 300 MG/ML  SOLN COMPARISON:  CT 01/17/2018 FINDINGS: CT CHEST FINDINGS Cardiovascular: LEFT perihilar mass completely constricts the RIGHT main pulmonary artery. Port in the anterior chest wall with tip in distal SVC. Mediastinum/Nodes: No axillary or supraclavicular adenopathy. Bulky mediastinal adenopathy similar to prior. Lungs/Pleura: The LEFT hilar mass completely obstructs the LEFT mainstem bronchus which is progressed from comparison exam. The entire LEFT upper lobe and LEFT lower lobe are collapsed. The entire LEFT hemithorax is fluid-filled. Mild hyperexpansion of the RIGHT lung. Small nodule in the RIGHT lower lobe measuring 6 mm (image 126/7) likely represents metastatic deposit. Small nodule in the RIGHT lower lobe posterir to the fissure measuring 5 mm on image 9/7). Musculoskeletal: No aggressive osseous lesion CT ABDOMEN PELVIS FINDINGS Hepatobiliary: Lesion in the caudate lobe measures 5.5 x 4.0 cm compared to 5.7 x 4.3 cm. Lesion the posterior aspect the RIGHT hepatic lobe measures 4.0 x 3.4 compared to 4.0 x 2.5 cm. Intervening lesion between these two lesions measures 2.1 cm (image 55/2) which is new from prior. No biliary  duct dilatation. Partially thrombosed RIGHT portal vein unchanged. Pancreas: No pancreatic metastasis Spleen: No splenic attached Adrenals/Urinary Tract: RIGHT adrenal gland metastasis measuring 3 cm in with compared to 3 cm. New LEFT adrenal gland metastasis. Kidneys, ureters bladder normal Stomach/Bowel: Stomach, small-bowel normal. LEFT abdominal wall colostomy. Moderate volume stool proximal colostomy without high-grade obstruction. Post rectosigmoid resection. Vascular/Lymphatic: Peri aortic lymph nodes are increased. For example lymph node beneath the SMA measuring 13 mm increased from 4 mm (image 70/2). No pelvic lymphadenopathy. Reproductive: Prostate gland normal. Other: No peritoneal metastasis. Musculoskeletal: No aggressive osseous lesion. IMPRESSION: Chest Impression: 1. Progression of the LEFT hilar mass with now complete obstruction of the LEFT mainstem bronchus. Complete collapse of the LEFT upper lobe and LEFT lower lobe with fluid filling the entire LEFT hemithorax. 2. Bulky mediastinal adenopathy unchanged. 3. Small pulmonary nodules in the  RIGHT lung consistent with metastasis. (Two small nodules noted). Abdomen / Pelvis Impression: 1. Interval mild increase in tumor burden in the RIGHT hepatic lobe. 2. Increase in upper abdominal periaortic adenopathy. 3. New LEFT adrenal gland metastasis. 4. Stable RIGHT adrenal gland metastasis. 5. Proctocolectomy without evidence local recurrence. These results will be called to the ordering clinician or representative by the Radiologist Assistant, and communication documented in the PACS or zVision Dashboard. Electronically Signed   By: Suzy Bouchard M.D.   On: 04/19/2018 17:08   Mr Jeri Cos ZJ Contrast  Result Date: 04/18/2018 CLINICAL DATA:  Colon cancer.  Brain metastases. EXAM: MRI HEAD WITHOUT AND WITH CONTRAST TECHNIQUE: Multiplanar, multiecho pulse sequences of the brain and surrounding structures were obtained without and with intravenous  contrast. CONTRAST:  52mL MULTIHANCE GADOBENATE DIMEGLUMINE 529 MG/ML IV SOLN COMPARISON:  MRI brain 02/13/2018.  Brain 11/15/2011. FINDINGS: BRAIN New Lesions: None. Larger lesions: 3. Enhancing lesion located left occipital lobe has increased from 10 mm to now 24 x 44 x 27 mm, with new or increased edema and mass effect seen on series 11, image 73. This previously represented 2 lesions which have now become a single lesion. Enhancing lesion located medial left parietal lobe has increased from 6 mm to now 10 x 10 mm, with new or increased edema and mass effect seen on series 11, image 106. Stable or Smaller lesions: 2 3 mm enhancing lesion located medial posterior left frontal lobe and seen on series 11, image 115. 2 mm enhancing lesion located anterior right frontal lobe and seen on series 11, image 111. Other Brain findings: Blood products are associated with the left occipital and medial left parietal lesions. No acute infarct or acute hemorrhage is present. Brainstem and cerebellum are normal. Ventricles are of normal size apart from some effacement of the left lateral ventricle. Vascular: Flow is present in the major intracranial arteries. Skull and upper cervical spine: The craniocervical junction is normal. Upper cervical spine is within normal limits. Marrow signal is unremarkable. Sinuses/Orbits: The paranasal sinuses and mastoid air cells are clear. The globes and orbits are within normal limits. IMPRESSION: 1. Progression of disease as evidenced by increased size in surrounding T2 signal changes involving lesions in the posterior left occipital lobe and medial posterior left parietal lobe. 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 Tumor progression is favored. 2. Total of 4 enhancing brain metastases, each annotated on 11. 3. Although remote blood products are associated with presumably treated lesions in the left occipital and parietal lobes, no acute hemorrhage is present.  Electronically Signed   By: San Morelle M.D.   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 EXAM: CT CHEST, ABDOMEN, AND PELVIS WITH CONTRAST TECHNIQUE: Multidetector CT imaging of the chest, abdomen and pelvis was performed following the standard protocol during bolus administration of intravenous contrast. CONTRAST:  111mL OMNIPAQUE IOHEXOL 300 MG/ML  SOLN COMPARISON:  CT 01/17/2018 FINDINGS: CT CHEST FINDINGS Cardiovascular: LEFT perihilar mass completely constricts the RIGHT main pulmonary artery. Port in the anterior chest wall with tip in distal SVC. Mediastinum/Nodes: No axillary or supraclavicular adenopathy. Bulky mediastinal adenopathy similar to prior. Lungs/Pleura: The LEFT hilar mass completely obstructs the LEFT mainstem bronchus which is progressed from comparison exam. The entire LEFT upper lobe and LEFT lower lobe are collapsed. The entire LEFT hemithorax is fluid-filled. Mild hyperexpansion of the RIGHT lung.  Small nodule in the RIGHT lower lobe measuring 6 mm (image 126/7) likely represents metastatic deposit. Small nodule in the RIGHT lower lobe posterir to the fissure measuring 5 mm on image 9/7). Musculoskeletal: No aggressive osseous lesion CT ABDOMEN PELVIS FINDINGS Hepatobiliary: Lesion in the caudate lobe measures 5.5 x 4.0 cm compared to 5.7 x 4.3 cm. Lesion the posterior aspect the RIGHT hepatic lobe measures 4.0 x 3.4 compared to 4.0 x 2.5 cm. Intervening lesion between these two lesions measures 2.1 cm (image 55/2) which is new from prior. No biliary duct dilatation. Partially thrombosed RIGHT portal vein unchanged. Pancreas: No pancreatic metastasis Spleen: No splenic attached Adrenals/Urinary Tract: RIGHT adrenal gland metastasis measuring 3 cm in with compared to 3 cm. New LEFT adrenal gland metastasis. Kidneys, ureters bladder normal Stomach/Bowel: Stomach,  small-bowel normal. LEFT abdominal wall colostomy. Moderate volume stool proximal colostomy without high-grade obstruction. Post rectosigmoid resection. Vascular/Lymphatic: Peri aortic lymph nodes are increased. For example lymph node beneath the SMA measuring 13 mm increased from 4 mm (image 70/2). No pelvic lymphadenopathy. Reproductive: Prostate gland normal. Other: No peritoneal metastasis. Musculoskeletal: No aggressive osseous lesion. IMPRESSION: Chest Impression: 1. Progression of the LEFT hilar mass with now complete obstruction of the LEFT mainstem bronchus. Complete collapse of the LEFT upper lobe and LEFT lower lobe with fluid filling the entire LEFT hemithorax. 2. Bulky mediastinal adenopathy unchanged. 3. Small pulmonary nodules in the RIGHT lung consistent with metastasis. (Two small nodules noted). Abdomen / Pelvis Impression: 1. Interval mild increase in tumor burden in the RIGHT hepatic lobe. 2. Increase in upper abdominal periaortic adenopathy. 3. New LEFT adrenal gland metastasis. 4. Stable RIGHT adrenal gland metastasis. 5. Proctocolectomy without evidence local recurrence. These results will be called to the ordering clinician or representative by the Radiologist Assistant, and communication documented in the PACS or zVision Dashboard. Electronically Signed   By: Suzy Bouchard M.D.   On: 04/19/2018 17:08       IMPRESSION:  The patient has some progression within the lungs and I believe is a good candidate for palliative radiation treatment to the left lung focusing on the left hilar region at the point of obstruction.  I discussed the rationale of such a treatment with the patient as well as possible side effects and risks.  He has not previously received radiation treatment to the chest.  After this discussion the patient does wish to proceed with such a treatment.  No immediate plans for additional systemic treatment although he is planning to proceed with this in the next several  weeks.   PLAN: The patient will proceed with simulation tomorrow for treatment planning.  I anticipate treating the patient to a dose of 30 Gy in 10 fractions beginning on Thursday, 04/27/2018.    The patient was seen today for 30 minutes, with the majority of the time spent counseling the patient on his diagnosis of cancer and coordinating his care.   ________________________________   Jodelle Gross, MD, PhD   **Disclaimer: This note was dictated with voice recognition software. Similar sounding words can inadvertently be transcribed and this note may contain transcription errors which may not have been corrected upon publication of note.**

## 2018-04-25 NOTE — Addendum Note (Signed)
Encounter addended by: Cori Razor, RN on: 04/25/2018 10:19 AM  Actions taken: Visit diagnoses modified

## 2018-04-26 ENCOUNTER — Ambulatory Visit
Admission: RE | Admit: 2018-04-26 | Discharge: 2018-04-26 | Disposition: A | Discharge status: Home / Self Care | Payer: BC Managed Care – PPO | Source: Ambulatory Visit | Attending: Radiation Oncology | Admitting: Radiation Oncology

## 2018-04-26 DIAGNOSIS — Z510 Encounter for antineoplastic radiation therapy: Secondary | ICD-10-CM | POA: Insufficient documentation

## 2018-04-26 DIAGNOSIS — C7931 Secondary malignant neoplasm of brain: Secondary | ICD-10-CM | POA: Diagnosis present

## 2018-04-26 DIAGNOSIS — C7802 Secondary malignant neoplasm of left lung: Secondary | ICD-10-CM

## 2018-04-26 NOTE — Progress Notes (Signed)
  Radiation Oncology         [phone] ________________________________  Name: Jacob [name] MRN: [id number]  Date: 04/26/2018  DOB: Apr 28, 1970  SIMULATION AND TREATMENT PLANNING NOTE  DIAGNOSIS:     ICD-10-CM   1. Secondary malignant neoplasm of left lung ([place]) C78.02      Site:  chest  NARRATIVE:  The patient was brought to the [facility].  Identity was confirmed.  All relevant records and images related to the planned course of therapy were reviewed.   Written consent to proceed with treatment was confirmed which was freely given after reviewing the details related to the planned course of therapy had been reviewed with the patient.  Then, the patient was set-up in a stable reproducible  supine position for radiation therapy.  CT images were obtained.  Surface markings were placed.    Medically necessary complex treatment device(s) for immobilization:  Customized vac-lock bag.   The CT images were loaded into the planning software.  Then the target and avoidance structures were contoured.  Treatment planning then occurred.  The radiation prescription was entered and confirmed.  A total of 3 complex treatment devices were fabricated which relate to the designed radiation treatment fields. Additional reduced fields will be used as necessary to improve the dose homogeneity of the plan. Each of these customized fields/ complex treatment devices will be used on a daily basis during the radiation course. I have requested : 3D Simulation  I have requested a DVH of the following structures: target volume, spinal cord, lungs, heart.   The patient will undergo daily image guidance to ensure accurate localization of the target, and adequate minimize dose to the normal surrounding structures in close proximity to the target.   PLAN:  The patient will receive 30 Gy in 10 fractions.    ________________________________   Jodelle Gross, MD, PhD

## 2018-04-27 ENCOUNTER — Ambulatory Visit
Admission: RE | Admit: 2018-04-27 | Discharge: 2018-04-27 | Disposition: A | Discharge status: Home / Self Care | Payer: BC Managed Care – PPO | Source: Ambulatory Visit | Attending: Radiation Oncology | Admitting: Radiation Oncology

## 2018-04-27 DIAGNOSIS — C7931 Secondary malignant neoplasm of brain: Secondary | ICD-10-CM | POA: Diagnosis not present

## 2018-04-28 ENCOUNTER — Ambulatory Visit
Admission: RE | Admit: 2018-04-28 | Discharge: 2018-04-28 | Disposition: A | Discharge status: Home / Self Care | Payer: BC Managed Care – PPO | Source: Ambulatory Visit | Attending: Radiation Oncology | Admitting: Radiation Oncology

## 2018-04-28 ENCOUNTER — Encounter: Payer: Self-pay | Admitting: General Practice

## 2018-04-28 DIAGNOSIS — C7931 Secondary malignant neoplasm of brain: Secondary | ICD-10-CM | POA: Diagnosis not present

## 2018-04-28 NOTE — Progress Notes (Signed)
[facility] Psychosocial Distress Screening Clinical Social Work  Clinical Social Work was referred by distress screening protocol.  The patient scored a 5 on the Psychosocial Distress Thermometer which indicates moderate distress. Clinical Social Worker contacted patient by phone to assess for distress and other psychosocial needs. Unable to reach patient by phone, left VM w information about [facility], contact information and encouragement to reach out as needed.    ONCBCN DISTRESS SCREENING 04/25/2018  Screening Type Initial Screening  Distress experienced in past week (1-10) 5  Emotional problem type Nervousness/Anxiety  Physical Problem type Pain;Nausea/vomiting;Talking;Constipation/diarrhea;Swollen arms/legs    Clinical Social Worker follow up needed: No.  If yes, follow up plan:  Beverely Pace, [place], LCSW Clinical Social Worker Phone:  [phone]

## 2018-05-01 ENCOUNTER — Ambulatory Visit
Admission: RE | Admit: 2018-05-01 | Discharge: 2018-05-01 | Disposition: A | Discharge status: Home / Self Care | Payer: BC Managed Care – PPO | Source: Ambulatory Visit | Attending: Radiation Oncology | Admitting: Radiation Oncology

## 2018-05-01 DIAGNOSIS — C7931 Secondary malignant neoplasm of brain: Secondary | ICD-10-CM | POA: Diagnosis not present

## 2018-05-02 ENCOUNTER — Telehealth: Payer: Self-pay | Admitting: *Deleted

## 2018-05-02 ENCOUNTER — Ambulatory Visit
Admission: RE | Admit: 2018-05-02 | Discharge: 2018-05-02 | Disposition: A | Discharge status: Home / Self Care | Payer: BC Managed Care – PPO | Source: Ambulatory Visit | Attending: Radiation Oncology | Admitting: Radiation Oncology

## 2018-05-02 DIAGNOSIS — C7931 Secondary malignant neoplasm of brain: Secondary | ICD-10-CM | POA: Diagnosis not present

## 2018-05-02 NOTE — Telephone Encounter (Signed)
Called to request to cancel his visit for 05/03/18 since he knows nothing at this time as to what [facility] suggests. They are waiting on more testing to come in. Was told most likely, nothing will start until his RT is completed. Also wants to drop off some forms to be completed and will leave these at the front desk.

## 2018-05-03 ENCOUNTER — Ambulatory Visit
Admission: RE | Admit: 2018-05-03 | Discharge: 2018-05-03 | Disposition: A | Discharge status: Home / Self Care | Payer: BC Managed Care – PPO | Source: Ambulatory Visit | Attending: Radiation Oncology | Admitting: Radiation Oncology

## 2018-05-03 ENCOUNTER — Other Ambulatory Visit: Payer: Self-pay | Admitting: Internal Medicine

## 2018-05-03 ENCOUNTER — Inpatient Hospital Stay: Payer: BC Managed Care – PPO | Admitting: Nurse Practitioner

## 2018-05-03 ENCOUNTER — Other Ambulatory Visit: Payer: Self-pay | Admitting: Family Medicine

## 2018-05-03 DIAGNOSIS — C7931 Secondary malignant neoplasm of brain: Secondary | ICD-10-CM | POA: Diagnosis not present

## 2018-05-04 ENCOUNTER — Telehealth: Payer: Self-pay | Admitting: Oncology

## 2018-05-04 ENCOUNTER — Ambulatory Visit
Admission: RE | Admit: 2018-05-04 | Discharge: 2018-05-04 | Disposition: A | Discharge status: Home / Self Care | Payer: BC Managed Care – PPO | Source: Ambulatory Visit | Attending: Radiation Oncology | Admitting: Radiation Oncology

## 2018-05-04 DIAGNOSIS — C7931 Secondary malignant neoplasm of brain: Secondary | ICD-10-CM | POA: Diagnosis not present

## 2018-05-04 NOTE — Telephone Encounter (Signed)
Scheduled appt per 2/13 sch message - left message for patient with appt date and time

## 2018-05-05 ENCOUNTER — Ambulatory Visit
Admission: RE | Admit: 2018-05-05 | Discharge: 2018-05-05 | Disposition: A | Discharge status: Home / Self Care | Payer: BC Managed Care – PPO | Source: Ambulatory Visit | Attending: Radiation Oncology | Admitting: Radiation Oncology

## 2018-05-05 DIAGNOSIS — C7931 Secondary malignant neoplasm of brain: Secondary | ICD-10-CM | POA: Diagnosis not present

## 2018-05-08 ENCOUNTER — Inpatient Hospital Stay: Payer: BC Managed Care – PPO | Attending: Oncology | Admitting: Internal Medicine

## 2018-05-08 ENCOUNTER — Ambulatory Visit
Admission: RE | Admit: 2018-05-08 | Discharge: 2018-05-08 | Disposition: A | Discharge status: Home / Self Care | Payer: BC Managed Care – PPO | Source: Ambulatory Visit | Attending: Radiation Oncology | Admitting: Radiation Oncology

## 2018-05-08 VITALS — BP 119/76 | HR 97 | Temp 98.0°F | Resp 18 | Ht 70.0 in | Wt 165.5 lb

## 2018-05-08 DIAGNOSIS — C7931 Secondary malignant neoplasm of brain: Secondary | ICD-10-CM | POA: Diagnosis not present

## 2018-05-08 DIAGNOSIS — R569 Unspecified convulsions: Secondary | ICD-10-CM | POA: Diagnosis not present

## 2018-05-08 DIAGNOSIS — C787 Secondary malignant neoplasm of liver and intrahepatic bile duct: Secondary | ICD-10-CM | POA: Insufficient documentation

## 2018-05-08 DIAGNOSIS — C20 Malignant neoplasm of rectum: Secondary | ICD-10-CM | POA: Insufficient documentation

## 2018-05-08 DIAGNOSIS — M7989 Other specified soft tissue disorders: Secondary | ICD-10-CM | POA: Diagnosis not present

## 2018-05-08 MED ORDER — DEXAMETHASONE 1 MG PO TABS: 2.0000 mg | ORAL_TABLET | Freq: Every day | ORAL | 3 refills | Status: AC

## 2018-05-08 NOTE — Progress Notes (Signed)
[facility] [place], [place] [address] [phone]   Interval Evaluation  Date of Service: 05/08/18 Patient Name: Jacob [name] Patient MRN: [id number] Patient DOB: 1971/02/10 Provider: Ventura Sellers, MD  Identifying Statement:  Jacob [name] is a 48 y.o. 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[place])    03/20/2012 - 04/27/2012 Radiation Therapy    RT for 28 Fractions w / Xeloda     04/30/2015 - 01/18/2018 Chemotherapy    The patient had palonosetron (ALOXI) injection 0.25 mg, 0.25 mg, Intravenous,  Once, 30 of 32 cycles Administration: 0.25 mg (05/21/2015), 0.25 mg (06/05/2015), 0.25 mg (06/19/2015), 0.25 mg (07/10/2015), 0.25 mg (07/24/2015), 0.25 mg (08/28/2015), 0.25 mg (09/11/2015), 0.25 mg (09/25/2015), 0.25 mg (10/09/2015), 0.25 mg (10/23/2015), 0.25 mg (11/04/2016), 0.25 mg (11/18/2016), 0.25 mg (12/02/2016), 0.25 mg (12/15/2016), 0.25 mg (12/30/2016), 0.25 mg (01/13/2017), 0.25 mg (01/27/2017), 0.25 mg (02/17/2017), 0.25 mg (03/03/2017), 0.25 mg (03/24/2017), 0.25 mg (04/07/2017), 0.25 mg (04/21/2017), 0.25 mg (05/05/2017), 0.25 mg (08/19/2017), 0.25 mg (09/01/2017), 0.25 mg (11/03/2017), 0.25 mg (11/17/2017), 0.25 mg (12/08/2017), 0.25 mg (12/22/2017), 0.25 mg (01/05/2018) pegfilgrastim (NEULASTA) injection 6 mg, 6 mg, Subcutaneous, Once, 23 of 23 cycles Administration: 6 mg (05/23/2015), 6 mg (06/07/2015), 6 mg (06/21/2015), 6 mg (07/12/2015), 6 mg (07/26/2015), 6 mg (08/30/2015), 6 mg (09/13/2015), 6 mg (09/27/2015), 6 mg (10/11/2015), 6 mg (10/25/2015), 6 mg (11/06/2016), 6 mg (11/20/2016), 6 mg (12/04/2016), 6 mg (12/17/2016), 6 mg (01/01/2017), 6 mg (01/15/2017), 6 mg (01/29/2017), 6 mg (02/19/2017), 6 mg (03/05/2017), 6 mg (03/26/2017), 6 mg (04/09/2017), 6 mg (04/23/2017), 6 mg (05/07/2017) pegfilgrastim-cbqv (UDENYCA)  injection 6 mg, 6 mg, Subcutaneous, Once, 6 of 8 cycles Administration: 6 mg (08/22/2017), 6 mg (11/05/2017), 6 mg (11/19/2017), 6 mg (12/10/2017), 6 mg (12/24/2017), 6 mg (01/07/2018) irinotecan (CAMPTOSAR) 258 mg in dextrose 5 % 500 mL chemo infusion, 135 mg/m2 = 258 mg (75 % of original dose 180 mg/m2), Intravenous,  Once, 30 of 32 cycles Dose modification: 135 mg/m2 (original dose 180 mg/m2, Cycle 1, Reason: Provider Judgment), 100 mg/m2 (original dose 180 mg/m2, Cycle 24, Reason: Provider Judgment) Administration: 258 mg (05/21/2015), 260 mg (06/05/2015), 258 mg (06/19/2015), 258 mg (07/10/2015), 260 mg (07/24/2015), 260 mg (08/28/2015), 260 mg (09/11/2015), 260 mg (09/25/2015), 260 mg (10/23/2015), 260 mg (11/04/2016), 240 mg (11/18/2016), 240 mg (12/02/2016), 240 mg (12/15/2016), 240 mg (12/30/2016), 240 mg (01/13/2017), 240 mg (01/27/2017), 240 mg (02/17/2017), 240 mg (03/03/2017), 240 mg (03/24/2017), 240 mg (04/07/2017), 240 mg (04/21/2017), 240 mg (05/05/2017), 180 mg (08/19/2017), 180 mg (09/01/2017), 180 mg (11/03/2017), 180 mg (11/17/2017), 180 mg (12/08/2017), 180 mg (12/22/2017), 180 mg (01/05/2018) leucovorin 764 mg in dextrose 5 % 250 mL infusion, 400 mg/m2 = 764 mg, Intravenous,  Once, 30 of 32 cycles Administration: 764 mg (05/21/2015), 764 mg (06/05/2015), 764 mg (06/19/2015), 764 mg (07/10/2015), 760 mg (07/24/2015), 760 mg (08/28/2015), 760 mg (09/11/2015), 760 mg (09/25/2015), 764 mg (10/09/2015), 764 mg (10/23/2015), 764 mg (11/04/2016), 764 mg (11/18/2016), 764 mg (12/02/2016), 764 mg (12/15/2016), 716 mg (12/30/2016), 716 mg (01/13/2017), 716 mg (01/27/2017), 716 mg (02/17/2017), 716 mg (03/03/2017), 716 mg (03/24/2017), 716 mg (04/07/2017), 716 mg (04/21/2017), 716 mg (05/05/2017), 736 mg (08/19/2017), 736 mg (09/01/2017), 736 mg (11/03/2017), 736 mg (11/17/2017), 736 mg (12/08/2017), 736 mg (12/22/2017), 736 mg (01/05/2018) fluorouracil (  ADRUCIL) chemo injection 750 mg, 400 mg/m2 = 750 mg, Intravenous,  Once, 10 of 10 cycles Administration: 750  mg (05/21/2015), 750 mg (06/05/2015), 750 mg (06/19/2015), 750 mg (07/10/2015), 750 mg (07/24/2015), 750 mg (08/28/2015), 750 mg (09/11/2015), 750 mg (09/25/2015), 750 mg (10/09/2015), 750 mg (10/23/2015) leucovorin injection 38 mg, 20 mg/m2 = 38 mg, Intravenous,  Once, 1 of 1 cycle fosaprepitant (EMEND) 150 mg, dexamethasone (DECADRON) 12 mg in sodium chloride 0.9 % 145 mL IVPB, , Intravenous,  Once, 30 of 32 cycles Administration:  (05/21/2015),  (06/05/2015),  (06/19/2015),  (07/10/2015),  (07/24/2015),  (08/28/2015),  (09/11/2015),  (09/25/2015),  (10/09/2015),  (10/23/2015),  (11/04/2016),  (11/18/2016),  (12/02/2016),  (12/15/2016),  (12/30/2016),  (01/13/2017),  (01/27/2017),  (02/17/2017),  (03/03/2017),  (03/24/2017),  (04/07/2017),  (04/21/2017),  (05/05/2017),  (08/19/2017),  (09/01/2017),  (11/03/2017),  (11/17/2017),  (12/08/2017),  (12/22/2017),  (01/05/2018) fluorouracil (ADRUCIL) 4,600 mg in sodium chloride 0.9 % 58 mL chemo infusion, 2,400 mg/m2 = 4,600 mg, Intravenous, 1 Day/Dose, 30 of 32 cycles Administration: 4,600 mg (05/21/2015), 4,600 mg (06/05/2015), 4,600 mg (06/19/2015), 4,600 mg (07/10/2015), 4,600 mg (07/24/2015), 4,600 mg (08/28/2015), 4,600 mg (09/11/2015), 4,600 mg (09/25/2015), 4,600 mg (10/09/2015), 4,600 mg (10/23/2015), 4,600 mg (11/04/2016), 4,600 mg (11/18/2016), 4,600 mg (12/02/2016), 4,600 mg (12/15/2016), 4,300 mg (12/30/2016), 4,300 mg (01/13/2017), 4,300 mg (01/27/2017), 4,300 mg (02/17/2017), 4,300 mg (03/03/2017), 4,300 mg (03/24/2017), 4,300 mg (04/07/2017), 4,300 mg (04/21/2017), 4,300 mg (05/05/2017), 4,300 mg (08/19/2017), 4,400 mg (09/01/2017), 4,400 mg (11/03/2017), 4,400 mg (11/17/2017), 4,400 mg (12/08/2017), 4,400 mg (12/22/2017), 4,400 mg (01/05/2018)  for chemotherapy treatment.     02/02/2018 -  Chemotherapy    The patient had palonosetron (ALOXI) injection 0.25 mg, 0.25 mg, Intravenous,  Once, 4 of 6 cycles Administration: 0.25 mg (02/02/2018), 0.25 mg (02/24/2018), 0.25 mg (03/09/2018), 0.25 mg (03/28/2018) pegfilgrastim-cbqv  (UDENYCA) injection 6 mg, 6 mg, Subcutaneous, Once, 3 of 5 cycles Administration: 6 mg (03/11/2018), 6 mg (03/30/2018) leucovorin 728 mg in dextrose 5 % 250 mL infusion, 400 mg/m2 = 728 mg, Intravenous,  Once, 4 of 6 cycles Administration: 728 mg (02/02/2018), 728 mg (02/24/2018), 728 mg (03/09/2018), 728 mg (03/28/2018) oxaliplatin (ELOXATIN) 150 mg in dextrose 5 % 500 mL chemo infusion, 82 mg/m2 = 155 mg, Intravenous,  Once, 4 of 6 cycles Dose modification: 60 mg/m2 (original dose 85 mg/m2, Cycle 2, Reason: Provider Judgment) Administration: 150 mg (02/02/2018), 110 mg (02/24/2018), 110 mg (03/09/2018), 110 mg (03/28/2018) fluorouracil (ADRUCIL) 4,350 mg in sodium chloride 0.9 % 63 mL chemo infusion, 2,400 mg/m2 = 4,350 mg, Intravenous, 1 Day/Dose, 4 of 6 cycles Administration: 4,350 mg (02/02/2018), 4,350 mg (02/24/2018), 4,350 mg (03/09/2018), 4,350 mg (03/28/2018)  for chemotherapy treatment.      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[place])    04/30/2015 - 01/18/2018 Chemotherapy    The patient had palonosetron (ALOXI) injection 0.25 mg, 0.25 mg, Intravenous,  Once, 30 of 32 cycles Administration: 0.25 mg (05/21/2015), 0.25 mg (06/05/2015), 0.25 mg (06/19/2015), 0.25 mg (07/10/2015), 0.25 mg (07/24/2015), 0.25 mg (08/28/2015), 0.25 mg (09/11/2015), 0.25 mg (09/25/2015), 0.25 mg (10/09/2015), 0.25 mg (10/23/2015), 0.25 mg (11/04/2016), 0.25 mg (11/18/2016), 0.25 mg (12/02/2016), 0.25 mg (12/15/2016), 0.25 mg (12/30/2016), 0.25 mg (01/13/2017), 0.25 mg (01/27/2017), 0.25 mg (02/17/2017), 0.25 mg (03/03/2017), 0.25 mg (03/24/2017), 0.25 mg (04/07/2017), 0.25 mg (04/21/2017), 0.25 mg (05/05/2017), 0.25 mg (08/19/2017), 0.25 mg (09/01/2017), 0.25 mg (11/03/2017), 0.25 mg (11/17/2017), 0.25 mg (12/08/2017), 0.25 mg (12/22/2017), 0.25  mg (01/05/2018) pegfilgrastim (NEULASTA) injection 6 mg, 6 mg, Subcutaneous, Once, 23 of 23 cycles Administration: 6 mg (05/23/2015), 6 mg  (06/07/2015), 6 mg (06/21/2015), 6 mg (07/12/2015), 6 mg (07/26/2015), 6 mg (08/30/2015), 6 mg (09/13/2015), 6 mg (09/27/2015), 6 mg (10/11/2015), 6 mg (10/25/2015), 6 mg (11/06/2016), 6 mg (11/20/2016), 6 mg (12/04/2016), 6 mg (12/17/2016), 6 mg (01/01/2017), 6 mg (01/15/2017), 6 mg (01/29/2017), 6 mg (02/19/2017), 6 mg (03/05/2017), 6 mg (03/26/2017), 6 mg (04/09/2017), 6 mg (04/23/2017), 6 mg (05/07/2017) pegfilgrastim-cbqv (UDENYCA) injection 6 mg, 6 mg, Subcutaneous, Once, 6 of 8 cycles Administration: 6 mg (08/22/2017), 6 mg (11/05/2017), 6 mg (11/19/2017), 6 mg (12/10/2017), 6 mg (12/24/2017), 6 mg (01/07/2018) irinotecan (CAMPTOSAR) 258 mg in dextrose 5 % 500 mL chemo infusion, 135 mg/m2 = 258 mg (75 % of original dose 180 mg/m2), Intravenous,  Once, 30 of 32 cycles Dose modification: 135 mg/m2 (original dose 180 mg/m2, Cycle 1, Reason: Provider Judgment), 100 mg/m2 (original dose 180 mg/m2, Cycle 24, Reason: Provider Judgment) Administration: 258 mg (05/21/2015), 260 mg (06/05/2015), 258 mg (06/19/2015), 258 mg (07/10/2015), 260 mg (07/24/2015), 260 mg (08/28/2015), 260 mg (09/11/2015), 260 mg (09/25/2015), 260 mg (10/23/2015), 260 mg (11/04/2016), 240 mg (11/18/2016), 240 mg (12/02/2016), 240 mg (12/15/2016), 240 mg (12/30/2016), 240 mg (01/13/2017), 240 mg (01/27/2017), 240 mg (02/17/2017), 240 mg (03/03/2017), 240 mg (03/24/2017), 240 mg (04/07/2017), 240 mg (04/21/2017), 240 mg (05/05/2017), 180 mg (08/19/2017), 180 mg (09/01/2017), 180 mg (11/03/2017), 180 mg (11/17/2017), 180 mg (12/08/2017), 180 mg (12/22/2017), 180 mg (01/05/2018) leucovorin 764 mg in dextrose 5 % 250 mL infusion, 400 mg/m2 = 764 mg, Intravenous,  Once, 30 of 32 cycles Administration: 764 mg (05/21/2015), 764 mg (06/05/2015), 764 mg (06/19/2015), 764 mg (07/10/2015), 760 mg (07/24/2015), 760 mg (08/28/2015), 760 mg (09/11/2015), 760 mg (09/25/2015), 764 mg (10/09/2015), 764 mg (10/23/2015), 764 mg (11/04/2016), 764 mg (11/18/2016), 764 mg (12/02/2016), 764 mg (12/15/2016), 716 mg (12/30/2016), 716 mg  (01/13/2017), 716 mg (01/27/2017), 716 mg (02/17/2017), 716 mg (03/03/2017), 716 mg (03/24/2017), 716 mg (04/07/2017), 716 mg (04/21/2017), 716 mg (05/05/2017), 736 mg (08/19/2017), 736 mg (09/01/2017), 736 mg (11/03/2017), 736 mg (11/17/2017), 736 mg (12/08/2017), 736 mg (12/22/2017), 736 mg (01/05/2018) fluorouracil (ADRUCIL) chemo injection 750 mg, 400 mg/m2 = 750 mg, Intravenous,  Once, 10 of 10 cycles Administration: 750 mg (05/21/2015), 750 mg (06/05/2015), 750 mg (06/19/2015), 750 mg (07/10/2015), 750 mg (07/24/2015), 750 mg (08/28/2015), 750 mg (09/11/2015), 750 mg (09/25/2015), 750 mg (10/09/2015), 750 mg (10/23/2015) leucovorin injection 38 mg, 20 mg/m2 = 38 mg, Intravenous,  Once, 1 of 1 cycle fosaprepitant (EMEND) 150 mg, dexamethasone (DECADRON) 12 mg in sodium chloride 0.9 % 145 mL IVPB, , Intravenous,  Once, 30 of 32 cycles Administration:  (05/21/2015),  (06/05/2015),  (06/19/2015),  (07/10/2015),  (07/24/2015),  (08/28/2015),  (09/11/2015),  (09/25/2015),  (10/09/2015),  (10/23/2015),  (11/04/2016),  (11/18/2016),  (12/02/2016),  (12/15/2016),  (12/30/2016),  (01/13/2017),  (01/27/2017),  (02/17/2017),  (03/03/2017),  (03/24/2017),  (04/07/2017),  (04/21/2017),  (05/05/2017),  (08/19/2017),  (09/01/2017),  (11/03/2017),  (11/17/2017),  (12/08/2017),  (12/22/2017),  (01/05/2018) fluorouracil (ADRUCIL) 4,600 mg in sodium chloride 0.9 % 58 mL chemo infusion, 2,400 mg/m2 = 4,600 mg, Intravenous, 1 Day/Dose, 30 of 32 cycles Administration: 4,600 mg (05/21/2015), 4,600 mg (06/05/2015), 4,600 mg (06/19/2015), 4,600 mg (07/10/2015), 4,600 mg (07/24/2015), 4,600 mg (08/28/2015), 4,600 mg (09/11/2015), 4,600 mg (09/25/2015), 4,600 mg (10/09/2015), 4,600 mg (10/23/2015), 4,600 mg (11/04/2016), 4,600 mg (11/18/2016), 4,600 mg (12/02/2016), 4,600 mg (12/15/2016), 4,300 mg (12/30/2016), 4,300 mg (01/13/2017), 4,300  mg (01/27/2017), 4,300 mg (02/17/2017), 4,300 mg (03/03/2017), 4,300 mg (03/24/2017), 4,300 mg (04/07/2017), 4,300 mg (04/21/2017), 4,300 mg (05/05/2017), 4,300 mg  (08/19/2017), 4,400 mg (09/01/2017), 4,400 mg (11/03/2017), 4,400 mg (11/17/2017), 4,400 mg (12/08/2017), 4,400 mg (12/22/2017), 4,400 mg (01/05/2018)  for chemotherapy treatment.     02/02/2018 -  Chemotherapy    The patient had palonosetron (ALOXI) injection 0.25 mg, 0.25 mg, Intravenous,  Once, 4 of 6 cycles Administration: 0.25 mg (02/02/2018), 0.25 mg (02/24/2018), 0.25 mg (03/09/2018), 0.25 mg (03/28/2018) pegfilgrastim-cbqv (UDENYCA) injection 6 mg, 6 mg, Subcutaneous, Once, 3 of 5 cycles Administration: 6 mg (03/11/2018), 6 mg (03/30/2018) leucovorin 728 mg in dextrose 5 % 250 mL infusion, 400 mg/m2 = 728 mg, Intravenous,  Once, 4 of 6 cycles Administration: 728 mg (02/02/2018), 728 mg (02/24/2018), 728 mg (03/09/2018), 728 mg (03/28/2018) oxaliplatin (ELOXATIN) 150 mg in dextrose 5 % 500 mL chemo infusion, 82 mg/m2 = 155 mg, Intravenous,  Once, 4 of 6 cycles Dose modification: 60 mg/m2 (original dose 85 mg/m2, Cycle 2, Reason: Provider Judgment) Administration: 150 mg (02/02/2018), 110 mg (02/24/2018), 110 mg (03/09/2018), 110 mg (03/28/2018) fluorouracil (ADRUCIL) 4,350 mg in sodium chloride 0.9 % 63 mL chemo infusion, 2,400 mg/m2 = 4,350 mg, Intravenous, 1 Day/Dose, 4 of 6 cycles Administration: 4,350 mg (02/02/2018), 4,350 mg (02/24/2018), 4,350 mg (03/09/2018), 4,350 mg (03/28/2018)  for chemotherapy treatment.      Interim History:  Jacob [name] presents following recent consultation and evaluation at [facility] for LITT.  Unfortunately he was not considered a good candidate for LITT because of recent systemic progression.  He did meet with a medical oncologist at [facility] who performed [place] testing and may give consideration for a clinical trial following lung radiation.  Subjectively he has few complaints today, his vision on the right side is "a little better if anything".  He does acknowledge recent swelling in his lower legs since starting decadron.    Medications: Current Outpatient Medications on  File Prior to Visit  Medication Sig Dispense Refill  . acetaminophen (TYLENOL) 500 MG tablet Take 500-1,000 mg by mouth daily as needed for moderate pain.    . clotrimazole (MYCELEX) 10 MG troche Take 1 lozenge (10 mg total) by mouth 4 (four) times daily. While on decadron 120 lozenge 0  . dexamethasone (DECADRON) 1 MG tablet Take 1 tablet (1 mg total) by mouth daily. (Patient taking differently: Take 4 mg by mouth daily. ) 30 tablet 3  . docusate sodium (STOOL SOFTENER) 100 MG capsule Take 100 mg by mouth 2 (two) times daily as needed (constipation).     Marland Kitchen dronabinol (MARINOL) 5 MG capsule Take 1 capsule (5 mg total) by mouth 3 (three) times daily as needed. (Patient taking differently: Take 5 mg by mouth 3 (three) times daily as needed (appetite). ) 90 capsule 3  . fluticasone (FLOVENT HFA) 110 MCG/ACT inhaler Inhale 2 puffs into the lungs 2 (two) times daily. 1 Inhaler 12  . lamoTRIgine (LAMICTAL) 100 MG tablet TAKE 1 TABLET BY MOUTH TWICE DAILY 60 tablet 0  . lidocaine-prilocaine (EMLA) cream Apply 1 application topically as needed (prior to port access). 30 g 1  . Multiple Vitamin (MULTIVITAMIN WITH MINERALS) TABS tablet Take 1 tablet by mouth 3 (three) times a week.    . prochlorperazine (COMPAZINE) 10 MG tablet Take 10 mg by mouth every 6 (six) hours as needed for nausea or vomiting.    . sennosides-docusate sodium (SENOKOT-S) 8.6-50 MG tablet Take 1 tablet by mouth daily as  needed for constipation.    Marland Kitchen tiZANidine (ZANAFLEX) 4 MG tablet TAKE 1/2 TO 1 (ONE-HALF TO ONE) TABLET BY MOUTH EVERY 8 HOURS AS NEEDED FOR MUSCLE SPASM 30 tablet 0   No current facility-administered medications on file prior to visit.     Allergies:  Allergies  Allergen Reactions  . Alprazolam Other (See Comments)    Excessive sedation  . Penicillins Rash    Childhood allergy Has patient had a PCN reaction causing immediate rash, facial/tongue/throat swelling, SOB or lightheadedness with hypotension: Unknown Has  patient had a PCN reaction causing severe rash involving mucus membranes or skin necrosis: Unknown Has patient had a PCN reaction that required hospitalization: No Has patient had a PCN reaction occurring within the last 10 years: No If all of the above answers are "NO", then may proceed with Cephalosporin use.    Past Medical History:  Past Medical History:  Diagnosis Date  . Allergic state 01/19/2012  . Anemia   . 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 Chicken pox as a child   ?  Marland Kitchen Dysrhythmia as child   HX OF IRREGULAR HB AT TIME OF TONSILLECTOMY - SURGERY WAS NOT DONE-CAN'T REMEMBER ANY OTHER DETAILS- NEVER HAD THE SURGERY.  . Elevated liver function tests 01/19/2012  . Fatty liver   . Hernia 6 months old  . History of diarrhea    better since chemo finished  . Hx of migraines   . Lung abnormality 2015  . Lung nodule 08/2013  . Lung nodule, multiple 11/29/2013  . Migraine headache as a child  . Numbness    TOES OF BOTH FEET.  Marland Kitchen Overweight(278.02) 01/19/2012  . Pain    LEFT HEEL PAIN -SEVERE ESPECIALLY AFTER SITTING OR LYING DOWN - DIFFICULT TO GET OUT OF BED AND WALK IN THE MORNINGS BECAUSE OF HEEL PAIN  . 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 Preventative health care 01/19/2012  . Radiation 03/20/12-04/27/12   Pelvis 50.4 gray Rectal cancer  . Rectal cancer ([place]) 03/06/12   biopsy-adenocarcinoma  . Reflux 01/19/2012  . Sun-damaged skin 01/19/2012   Past Surgical History:  Past Surgical History:  Procedure Laterality Date  . CHOLECYSTECTOMY N/A 09/17/2013   Procedure: CHOLECYSTECTOMY;  Surgeon: Stark Klein, MD;  Location: [place] [place] [facility];  Service: General;  Laterality: N/A;  . COLOSTOMY TAKEDOWN N/A 08/17/2016   Procedure: LAPAROSCOPIC ABDOMINOPERINEAL RESECTION WITH PERMANENT COLOSTOMY;  Surgeon: Leighton Ruff, MD;  Location: [facility];  Service: General;  Laterality: N/A;  . CYST  EXCISION     L EAR AREA  . EUS  03/14/2012   Procedure: LOWER ENDOSCOPIC ULTRASOUND (EUS);  Surgeon: Milus Banister, MD;  Location: [facility];  Service: Endoscopy;  Laterality: N/A;  . HERNIA REPAIR  6 months old   right inguinal repair  . ILEOSTOMY CLOSURE N/A 12/07/2012   Procedure: ILEOSTOMY TAKEDOWN;  Surgeon: Leighton Ruff, MD;  Location: [facility];  Service: General;  Laterality: N/A;  . LAPAROSCOPIC LOW ANTERIOR RESECTION N/A 06/29/2012   Procedure: LAPAROSCOPIC LOW ANTERIOR RESECTION, mobilization splenic flexure,coloanal anastomosis,diverting ileostomy;  Surgeon: Leighton Ruff, MD;  Location: [facility];  Service: General;  Laterality: N/A;  . LAPAROSCOPY N/A 09/17/2013   Procedure: LAPAROSCOPY DIAGNOSTIC;  Surgeon: Stark Klein, MD;  Location: [facility];  Service: General;  Laterality: N/A;  . LARYNGOPLASTY Left 02/10/2018   Procedure: LARYNGOPLASTY;  Surgeon: Melissa Montane, MD;  Location: [facility];  Service: ENT;  Laterality: Left;  . LIVER ULTRASOUND N/A 09/17/2013   Procedure: LIVER ULTRASOUND;  Surgeon: Stark Klein, MD;  Location: [facility];  Service: General;  Laterality: N/A;  . MICROLARYNGOSCOPY W/VOCAL CORD INJECTION Left 05/16/2017   Procedure: MICROLARYNGOSCOPY WITH VOCAL CORD INJECTION;  Surgeon: Melissa Montane, MD;  Location: [place];  Service: ENT;  Laterality: Left;  . OPEN HEPATECTOMY  N/A 09/17/2013   Procedure: OPEN HEPATECTOMY;  Surgeon: Stark Klein, MD;  Location: [facility] [facility];  Service: General;  Laterality: N/A;  . PORTACATH PLACEMENT Left 03/21/2013   Procedure: INSERTION PORT-A-CATH;  Surgeon: Leighton Ruff, MD;  Location: [facility];  Service: General;  Laterality: Left;  . RECTAL BIOPSY  03/06/12   distal mass1-2cm from anal verge=Adenocarcinoma  . TONSILLECTOMY     Social History:  Social History   Socioeconomic History  . Marital status: Married    Spouse name: Not on file  . Number of children: 1  . Years of education: Not on file  . Highest education level:  Not on file  Occupational History  . Occupation: Midwife: [employer]    Comment: [employer]  Social Needs  . Financial resource strain: Not on file  . Food insecurity:    Worry: Not on file    Inability: Not on file  . Transportation needs:    Medical: No    Non-medical: No  Tobacco Use  . Smoking status: Never Smoker  . Smokeless tobacco: Never Used  Substance and Sexual Activity  . Alcohol use: Not Currently    Alcohol/week: 0.0 standard drinks    Comment: RARE   . Drug use: No    Comment: marijuana past  . Sexual activity: Yes    Partners: Female  Lifestyle  . Physical activity:    Days per week: Not on file    Minutes per session: Not on file  . Stress: Not on file  Relationships  . Social connections:    Talks on phone: Not on file    Gets together: Not on file    Attends religious service: Not on file    Active member of club or organization: Not on file    Attends meetings of clubs or organizations: Not on file    Relationship status: Not on file  . Intimate partner violence:    Fear of current or ex partner: Not on file    Emotionally abused: Not on file    Physically abused: Not on file    Forced sexual activity: Not on file  Other Topics Concern  . Not on file  Social History Narrative   Married to wife, [name]   Has one 21 year old child   Occupation: Emergency planning/management officer at [employer]   Family History:  Family History  Adopted: Yes  Problem Relation Age of Onset  . Cancer Father   . Heart disease Father   . Colon cancer Neg Hx     Review of Systems: Constitutional: Denies fevers, chills or abnormal weight loss Eyes: Denies blurriness of vision Ears, nose, mouth, throat, and face: Denies mucositis or sore throat Respiratory: Denies cough, dyspnea or wheezes Cardiovascular: Denies palpitation, chest discomfort or lower extremity swelling Gastrointestinal:  Colostomy GU: Denies dysuria or incontinence Skin: +leg rash Neurological:  +headaches Musculoskeletal: +joint pain, No decrease in ROM Behavioral/Psych: mood instability   Physical Exam: Vitals:   05/08/18 1207  BP: 119/76  Pulse: 97  Resp: 18  Temp: 98 F (36.7 C)  SpO2: 100%   KPS: 90. General: Alert, cooperative, pleasant, in no acute distress Head: Normal EENT: No conjunctival injection or scleral icterus. Oral mucosa moist Lungs: Resp effort normal Cardiac: Regular rate and rhythm Abdomen: Colostomy C/D/I Skin: No rashes cyanosis or petechiae. Extremities: 2+ pitting edema b/l LE  Neurologic Exam: Mental Status: Awake, alert, attentive to examiner. Language is fluent with intact comprehension.  Cranial Nerves: Visual acuity is grossly normal. Right homonymous hemianopia. Extra-ocular movements intact. No ptosis. Face is symmetric, tongue midline. Motor: Tone and bulk are normal. Power is full in both arms and legs. Reflexes are symmetric, no pathologic reflexes present. Intact finger to nose bilaterally Sensory: Intact to light touch and temperature Gait: Normal and tandem gait deferred  Labs: I have reviewed the data as listed    Component Value Date/Time   NA 138 04/12/2018 1028   NA 142 03/24/2017 1009   K 3.9 04/12/2018 1028   K 4.0 03/24/2017 1009   CL 106 04/12/2018 1028   CL 108 (H) 08/30/2012 0903   CO2 24 04/12/2018 1028   CO2 27 03/24/2017 1009   GLUCOSE 113 (H) 04/12/2018 1028   GLUCOSE 96 03/24/2017 1009   GLUCOSE 96 08/30/2012 0903   BUN 10 04/12/2018 1028   BUN 12.2 03/24/2017 1009   CREATININE 0.79 04/12/2018 1028   CREATININE 0.7 03/24/2017 1009   CALCIUM 9.1 04/12/2018 1028   CALCIUM 8.8 03/24/2017 1009   PROT 6.5 04/12/2018 1028   PROT 6.1 (L) 03/24/2017 1009   ALBUMIN 3.4 (L) 04/12/2018 1028   ALBUMIN 3.6 03/24/2017 1009   AST 24 04/12/2018 1028   AST 19 03/24/2017 1009   ALT 24 04/12/2018 1028   ALT 14 03/24/2017 1009   ALKPHOS 168 (H) 04/12/2018 1028   ALKPHOS 67 03/24/2017 1009   BILITOT 0.4  04/12/2018 1028   BILITOT <0.22 03/24/2017 1009   GFRNONAA >60 04/12/2018 1028   GFRAA >60 04/12/2018 1028   Lab Results  Component Value Date   WBC 7.6 04/12/2018   NEUTROABS 6.3 04/12/2018   HGB 9.8 (L) 04/12/2018   HCT 31.9 (L) 04/12/2018   MCV 85.8 04/12/2018   PLT 107 (L) 04/12/2018   Imaging:  [facility] Interpretation: I have personally reviewed the CNS images as listed.  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 EXAM: CT CHEST, ABDOMEN, AND PELVIS WITH CONTRAST TECHNIQUE: Multidetector CT imaging of the chest, abdomen and pelvis was performed following the standard protocol during bolus administration of intravenous contrast. CONTRAST:  158mL OMNIPAQUE IOHEXOL 300 MG/ML  SOLN COMPARISON:  CT 01/17/2018 FINDINGS: CT CHEST FINDINGS Cardiovascular: LEFT perihilar mass completely constricts the RIGHT main pulmonary artery. Port in the anterior chest wall with tip in distal SVC. Mediastinum/Nodes: No axillary or supraclavicular adenopathy. Bulky mediastinal adenopathy similar to prior. Lungs/Pleura: The LEFT hilar mass completely obstructs the LEFT mainstem bronchus which is progressed from comparison exam. The entire LEFT upper lobe and LEFT lower lobe are collapsed. The entire LEFT hemithorax is fluid-filled. Mild hyperexpansion of the RIGHT lung. Small nodule in the RIGHT lower lobe measuring 6 mm (image 126/7) likely represents metastatic deposit. Small nodule in the RIGHT lower lobe posterir to the fissure measuring 5 mm on image 9/7). Musculoskeletal: No aggressive osseous lesion CT ABDOMEN PELVIS FINDINGS Hepatobiliary: Lesion in the caudate lobe measures 5.5  x 4.0 cm compared to 5.7 x 4.3 cm. Lesion the posterior aspect the RIGHT hepatic lobe measures 4.0 x 3.4 compared to 4.0 x 2.5 cm.  Intervening lesion between these two lesions measures 2.1 cm (image 55/2) which is new from prior. No biliary duct dilatation. Partially thrombosed RIGHT portal vein unchanged. Pancreas: No pancreatic metastasis Spleen: No splenic attached Adrenals/Urinary Tract: RIGHT adrenal gland metastasis measuring 3 cm in with compared to 3 cm. New LEFT adrenal gland metastasis. Kidneys, ureters bladder normal Stomach/Bowel: Stomach, small-bowel normal. LEFT abdominal wall colostomy. Moderate volume stool proximal colostomy without high-grade obstruction. Post rectosigmoid resection. Vascular/Lymphatic: Peri aortic lymph nodes are increased. For example lymph node beneath the SMA measuring 13 mm increased from 4 mm (image 70/2). No pelvic lymphadenopathy. Reproductive: Prostate gland normal. Other: No peritoneal metastasis. Musculoskeletal: No aggressive osseous lesion. IMPRESSION: Chest Impression: 1. Progression of the LEFT hilar mass with now complete obstruction of the LEFT mainstem bronchus. Complete collapse of the LEFT upper lobe and LEFT lower lobe with fluid filling the entire LEFT hemithorax. 2. Bulky mediastinal adenopathy unchanged. 3. Small pulmonary nodules in the RIGHT lung consistent with metastasis. (Two small nodules noted). Abdomen / Pelvis Impression: 1. Interval mild increase in tumor burden in the RIGHT hepatic lobe. 2. Increase in upper abdominal periaortic adenopathy. 3. New LEFT adrenal gland metastasis. 4. Stable RIGHT adrenal gland metastasis. 5. Proctocolectomy without evidence local recurrence. These results will be called to the ordering clinician or representative by the Radiologist Assistant, and communication documented in the PACS or zVision Dashboard. Electronically Signed   By: Suzy Bouchard M.D.   On: 04/19/2018 17:08   Mr Jeri Cos KX Contrast  Result Date: 04/18/2018 CLINICAL DATA:  Colon cancer.  Brain metastases. EXAM: MRI HEAD WITHOUT AND WITH CONTRAST TECHNIQUE: Multiplanar,  multiecho pulse sequences of the brain and surrounding structures were obtained without and with intravenous contrast. CONTRAST:  46mL MULTIHANCE GADOBENATE DIMEGLUMINE 529 MG/ML IV SOLN COMPARISON:  MRI brain 02/13/2018.  Brain 11/15/2011. FINDINGS: BRAIN New Lesions: None. Larger lesions: 3. Enhancing lesion located left occipital lobe has increased from 10 mm to now 24 x 44 x 27 mm, with new or increased edema and mass effect seen on series 11, image 73. This previously represented 2 lesions which have now become a single lesion. Enhancing lesion located medial left parietal lobe has increased from 6 mm to now 10 x 10 mm, with new or increased edema and mass effect seen on series 11, image 106. Stable or Smaller lesions: 2 3 mm enhancing lesion located medial posterior left frontal lobe and seen on series 11, image 115. 2 mm enhancing lesion located anterior right frontal lobe and seen on series 11, image 111. Other Brain findings: Blood products are associated with the left occipital and medial left parietal lesions. No acute infarct or acute hemorrhage is present. Brainstem and cerebellum are normal. Ventricles are of normal size apart from some effacement of the left lateral ventricle. Vascular: Flow is present in the major intracranial arteries. Skull and upper cervical spine: The craniocervical junction is normal. Upper cervical spine is within normal limits. Marrow signal is unremarkable. Sinuses/Orbits: The paranasal sinuses and mastoid air cells are clear. The globes and orbits are within normal limits. IMPRESSION: 1. Progression of disease as evidenced by increased size in surrounding T2 signal changes involving lesions in the posterior left occipital lobe and medial posterior left parietal lobe. 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 Tumor progression is favored. 2. Total  of 4 enhancing brain metastases, each annotated on 11. 3. Although remote blood products are associated with  presumably treated lesions in the left occipital and parietal lobes, no acute hemorrhage is present. Electronically Signed   By: San Morelle M.D.   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 EXAM: CT CHEST, ABDOMEN, AND PELVIS WITH CONTRAST TECHNIQUE: Multidetector CT imaging of the chest, abdomen and pelvis was performed following the standard protocol during bolus administration of intravenous contrast. CONTRAST:  141mL OMNIPAQUE IOHEXOL 300 MG/ML  SOLN COMPARISON:  CT 01/17/2018 FINDINGS: CT CHEST FINDINGS Cardiovascular: LEFT perihilar mass completely constricts the RIGHT main pulmonary artery. Port in the anterior chest wall with tip in distal SVC. Mediastinum/Nodes: No axillary or supraclavicular adenopathy. Bulky mediastinal adenopathy similar to prior. Lungs/Pleura: The LEFT hilar mass completely obstructs the LEFT mainstem bronchus which is progressed from comparison exam. The entire LEFT upper lobe and LEFT lower lobe are collapsed. The entire LEFT hemithorax is fluid-filled. Mild hyperexpansion of the RIGHT lung. Small nodule in the RIGHT lower lobe measuring 6 mm (image 126/7) likely represents metastatic deposit. Small nodule in the RIGHT lower lobe posterir to the fissure measuring 5 mm on image 9/7). Musculoskeletal: No aggressive osseous lesion CT ABDOMEN PELVIS FINDINGS Hepatobiliary: Lesion in the caudate lobe measures 5.5 x 4.0 cm compared to 5.7 x 4.3 cm. Lesion the posterior aspect the RIGHT hepatic lobe measures 4.0 x 3.4 compared to 4.0 x 2.5 cm. Intervening lesion between these two lesions measures 2.1 cm (image 55/2) which is new from prior. No biliary duct dilatation. Partially thrombosed RIGHT portal vein unchanged. Pancreas: No pancreatic metastasis Spleen: No splenic attached Adrenals/Urinary Tract: RIGHT adrenal gland metastasis measuring 3 cm in with compared  to 3 cm. New LEFT adrenal gland metastasis. Kidneys, ureters bladder normal Stomach/Bowel: Stomach, small-bowel normal. LEFT abdominal wall colostomy. Moderate volume stool proximal colostomy without high-grade obstruction. Post rectosigmoid resection. Vascular/Lymphatic: Peri aortic lymph nodes are increased. For example lymph node beneath the SMA measuring 13 mm increased from 4 mm (image 70/2). No pelvic lymphadenopathy. Reproductive: Prostate gland normal. Other: No peritoneal metastasis. Musculoskeletal: No aggressive osseous lesion. IMPRESSION: Chest Impression: 1. Progression of the LEFT hilar mass with now complete obstruction of the LEFT mainstem bronchus. Complete collapse of the LEFT upper lobe and LEFT lower lobe with fluid filling the entire LEFT hemithorax. 2. Bulky mediastinal adenopathy unchanged. 3. Small pulmonary nodules in the RIGHT lung consistent with metastasis. (Two small nodules noted). Abdomen / Pelvis Impression: 1. Interval mild increase in tumor burden in the RIGHT hepatic lobe. 2. Increase in upper abdominal periaortic adenopathy. 3. New LEFT adrenal gland metastasis. 4. Stable RIGHT adrenal gland metastasis. 5. Proctocolectomy without evidence local recurrence. These results will be called to the ordering clinician or representative by the Radiologist Assistant, and communication documented in the PACS or zVision Dashboard. Electronically Signed   By: Suzy Bouchard M.D.   On: 04/19/2018 17:08    Assessment/Plan 1. Brain metastasis ([place])  2. Focal seizures [facility])  Jacob [name] is clinically stable today with regards to brain tumor-related deficits.  For local progression in the left occipital lobe, either radiation necrosis or organic tumor, he is not a candidate for re-resection or LITT therapy at this time.  Avastin is a poor option given history of bright-red hemoptysis and active lung disease.  We are hopeful that his CNS disease burden  is driven by an inflammatory process,  and that limited course of steroids can help keep this "at bay".  That said, his dependent edema is likely steroid related, and he has history of psychosis previously on high doses of dexamethasone.  We recommended careful wean of decadron, starting with 2mg  daily for the next 7-10 days.  After that he can discontinue therapy if tolerated.  He should continue Lamictal to 100mg  BID for seizures.  Systemic therapy decision pathways are still pending, he will return to [facility] for follow up in 1-2 weeks following RT.   We recommended he return to clinic in April with an MRI brain for review, or sooner as clinically indicated.  He will call us if he has difficulty with steroid taper and adjustments can be made "on the fly".  We appreciate the opportunity to participate in the care of Jacob [name].   All questions were answered. The patient knows to call the clinic with any problems, questions or concerns. No barriers to learning were detected.  The total time spent in the encounter was 25 minutes and more than 50% was on counseling and review of test results   Ventura Sellers, MD Medical Director of Neuro-Oncology [facility] [place] 05/08/18 12:06 PM

## 2018-05-08 NOTE — Progress Notes (Signed)
FMLA successfully faxed to Gretel Acre at [place] at [phone]. Mailed copy to patient address on file.

## 2018-05-09 ENCOUNTER — Ambulatory Visit
Admission: RE | Admit: 2018-05-09 | Discharge: 2018-05-09 | Disposition: A | Discharge status: Home / Self Care | Payer: BC Managed Care – PPO | Source: Ambulatory Visit | Attending: Radiation Oncology | Admitting: Radiation Oncology

## 2018-05-09 DIAGNOSIS — C7931 Secondary malignant neoplasm of brain: Secondary | ICD-10-CM | POA: Diagnosis not present

## 2018-05-10 ENCOUNTER — Ambulatory Visit
Admission: RE | Admit: 2018-05-10 | Discharge: 2018-05-10 | Disposition: A | Discharge status: Home / Self Care | Payer: BC Managed Care – PPO | Source: Ambulatory Visit | Attending: Radiation Oncology | Admitting: Radiation Oncology

## 2018-05-10 ENCOUNTER — Encounter: Payer: Self-pay | Admitting: Radiation Oncology

## 2018-05-10 DIAGNOSIS — C7931 Secondary malignant neoplasm of brain: Secondary | ICD-10-CM | POA: Diagnosis not present

## 2018-05-11 ENCOUNTER — Telehealth: Payer: Self-pay | Admitting: Internal Medicine

## 2018-05-11 NOTE — Telephone Encounter (Signed)
Scheduled appts per 02/17 los. Printed and mailed calendar.

## 2018-05-15 ENCOUNTER — Telehealth: Payer: Self-pay | Admitting: Oncology

## 2018-05-15 ENCOUNTER — Inpatient Hospital Stay (HOSPITAL_BASED_OUTPATIENT_CLINIC_OR_DEPARTMENT_OTHER): Payer: BC Managed Care – PPO | Admitting: Oncology

## 2018-05-15 VITALS — BP 116/81 | HR 84 | Temp 97.9°F | Resp 19 | Ht 70.0 in | Wt 165.1 lb

## 2018-05-15 DIAGNOSIS — R1310 Dysphagia, unspecified: Secondary | ICD-10-CM

## 2018-05-15 DIAGNOSIS — M7989 Other specified soft tissue disorders: Secondary | ICD-10-CM | POA: Diagnosis not present

## 2018-05-15 DIAGNOSIS — C787 Secondary malignant neoplasm of liver and intrahepatic bile duct: Secondary | ICD-10-CM | POA: Diagnosis not present

## 2018-05-15 DIAGNOSIS — C7931 Secondary malignant neoplasm of brain: Secondary | ICD-10-CM | POA: Diagnosis not present

## 2018-05-15 DIAGNOSIS — C20 Malignant neoplasm of rectum: Secondary | ICD-10-CM

## 2018-05-15 DIAGNOSIS — R05 Cough: Secondary | ICD-10-CM

## 2018-05-15 DIAGNOSIS — R569 Unspecified convulsions: Secondary | ICD-10-CM | POA: Diagnosis not present

## 2018-05-15 MED ORDER — SUCRALFATE 1 GM/10ML PO SUSP: 1.0000 g | Freq: Three times a day (TID) | ORAL | 1 refills | Status: DC

## 2018-05-15 NOTE — Telephone Encounter (Signed)
Spoke with patient re port/LT 3/9 and also contacting [facility] re mri order from [place] 2/17 to be completed in April prior to follow up with [facility].

## 2018-05-15 NOTE — Progress Notes (Signed)
Jacob [name]   Diagnosis: Rectal cancer  INTERVAL HISTORY:   Jacob [name] returns for a scheduled visit.  He completed palliative radiation to the left chest on 05/10/2018.  He has not noted a change in the dyspnea.  He reports a minimal cough.  He complains of odynophagia.  He is not taking medication for this.  No difficulty with bowel movements.  He saw Jacob [name] 05/01/2020 discussed treatment options.  Guardant testing was submitted and the result is pending.  He was evaluated by neurosurgery at [facility].    Objective:  Vital signs in last 24 hours:  Blood pressure 116/81, pulse 84, temperature 97.9 F (36.6 C), temperature source Oral, resp. rate 19, height '5\' 10"'  (1.778 m), weight 165 lb 1.6 oz (74.9 kg), SpO2 100 %.    HEENT: Mild white coat over the tongue, no buccal thrush Resp: Decreased breath sounds throughout the left chest, no respiratory distress Cardio: Regular rate and rhythm GI: No hepatomegaly, nontender, left lower quadrant colostomy Vascular: Trace pitting edema at the lower leg bilaterally    Portacath/PICC-without erythema  Lab Results:  Lab Results  Component Value Date   WBC 7.6 04/12/2018   HGB 9.8 (L) 04/12/2018   HCT 31.9 (L) 04/12/2018   MCV 85.8 04/12/2018   PLT 107 (L) 04/12/2018   NEUTROABS 6.3 04/12/2018    CMP  Lab Results  Component Value Date   NA 138 04/12/2018   K 3.9 04/12/2018   CL 106 04/12/2018   CO2 24 04/12/2018   GLUCOSE 113 (H) 04/12/2018   BUN 10 04/12/2018   CREATININE 0.79 04/12/2018   CALCIUM 9.1 04/12/2018   PROT 6.5 04/12/2018   ALBUMIN 3.4 (L) 04/12/2018   AST 24 04/12/2018   ALT 24 04/12/2018   ALKPHOS 168 (H) 04/12/2018   BILITOT 0.4 04/12/2018   GFRNONAA >60 04/12/2018   GFRAA >60 04/12/2018    Lab Results  Component Value Date   CEA1 130.05 (H) 04/12/2018    Medications: I have reviewed the patient's current medications.   Assessment/Plan:  1. Rectal cancer.  Partially obstructing mass noted 1-2 cm from the anal verge on a colonoscopy 03/06/2012. Endoscopic ultrasound 03/14/2012 with a 7.5 mm thick, 3.2 cm wide hypoechoic, irregularly bordered mass that clearly passed into and through the muscularis propria layer of the distal rectal wall (uT3); 3 small (largest 7 mm) perirectal lymph nodes. The lymph nodes were all round, discrete, hypoechoic, homogenous; suspicious for malignant involvement (uN1).  No RAS mutation identified by [facility] testing on the colon resection specimen 06/29/2012, APC alteration identified, microsatellite stable  He began radiation and concurrent Xeloda chemotherapy on 03/20/2012, completed 04/27/2012.   Low anterior resection/coloanal anastomosis and diverting ileostomy 06/29/2012 with the final pathology revealing a T2N0 tumor with extensive fibrosis and negative margins.   Cycle 1 of adjuvant CAPOX 07/19/2012. Cycle 5 of adjuvant CAPOX 10/11/2012.   CEA 2.5 on 11/14/2012.   CEA 14.6 03/08/2013.   Restaging CT evaluation 03/08/2013 with a 4 mm pulmonary nodule left lung base not identified on comparison exam; new liver lesions including a 29 x 26 mm irregular peripheral enhancing rounded lesion in the dome of the right hepatic lobe, a less well-defined new subcapsular lesion in the lateral right hepatic lobe measuring 12 mm, a new subcapsular lesion in the anterior right hepatic lobe adjacent to the gallbladder fossa measuring 10 mm; a rounded low-density lesion in the inferior right hepatic lobe measuring 10 mm compared to  7 mm on the prior study (radiologist commented this may represent an enlarged cyst).   MRI of the abdomen 04/12/2013 confirmed multiple T2 hyperintense metastatic lesions throughout the right liver   Initiation of FOLFIRI/Avastin with genotype based irinotecan dosing per the [facility] study 04/05/2013.   Restaging CT evaluation on 06/20/2013 (after 2 cycles/4 treatments) showed improvement in the  liver metastases and stable size of a left lower lobe pulmonary nodule now with central cavitation.   Continuation of FOLFIRI/Avastin.   Restaging CT evaluation 08/14/2013-decrease in the size of liver metastases, slight decrease in the size of a cavitary left lower lobe nodule, no evidence of disease progression.   Status post right hepatic lobectomy 09/17/2013. Pathology showed multiple foci of metastatic adenocarcinoma (5 nodules of metastatic adenocarcinoma 4 of which are subcapsular with the nodules ranging in size from 0.6-1.8 cm in greatest dimension). Margins not involved. Biopsy of a portal lymph node showed benign adipose tissue; no lymph node tissue or malignancy.   Normal CEA 11/06/2013   CT 11/06/2013 with a new pleural-based right lower chest lesion, slight in enlargement of the a left-sided lung nodule  CT 01/29/2014 with a decrease in the right lower chest pleural-based lesion and a slight increase of a left-sided lung lesion, other lung lesions were stable  CT chest 05/02/2014 with a decrease in the size of a right lower lobe pulmonary nodule months similar size of a dominant left lower lobe nodule, minimal enlargement of a smaller left lower lobe nodule, no new site of disease  CT chest 11/07/2014 with a slight increase in several left-sided lung nodules  CT chest 02/10/2015 with new small left hilar lymph nodes, a possible new left lower lobe nodule, and stability of other lung nodules  PET scan 03/12/2015 with hypermetabolic left hilar nodes, hypermetabolic left lower lobe nodules, hyper metabolic retroperitoneal nodes, intense hypermetabolism at the coloanal anastomosis, and hypermetabolic thickening in the presacral space  Cycle 1 FOLFIRI/PANITUMUMAB 05/21/2015  Cycle 2 FOLFIRI/PANITUMUMAB 06/05/2015  Cycle 3 FOLFIRI/PANITUMUMAB 06/19/2015  Cycle 4 FOLFIRI 07/10/2015  Cycle 5 FOLFIRI 07/24/2015  Restaging CTs 08/06/2015-resolution of hilar/retroperitoneal  adenopathy, improvement in the hypermetabolic lung nodule, other lung nodules are stable, no new lesions  Cycle 6 FOLFIRI with PANITUMUMAB 08/28/2015 -panitumumab dose reduced  Cycle 7 FOLFIRI 09/11/2015-no panitumumab given  Cycle 8 FOLFIRI with PANITUMUMAB 09/25/2015-PANITUMUMAB dose reduced  Cycle 9 FOLFIRI 10/09/2015-no PANITUMUMAB given  Cycle 10 FOLFIRI with panitumumab 10/23/2015  CTs 11/06/2015-possible slight enlargement of a left lower lobe nodule, no other evidence of disease progression.  CTs 02/16/2016-enlargement of left-sided pulmonary nodules and mediastinal/left hilar nodes, improved splenomegaly  CTs 06/21/2016-increased size of mediastinal/left hilar nodes, increased left lung nodules, and increased soft tissue at the porta hepatis  CTs 10/28/2016-progressive disease in the chest and abdomen with an enlarging left hilar mass and slight interval enlargement of pulmonary lesions. Right upper quadrant necrotic nodal mass significantly increased in size with mass effect on the liver and possible invasion. Significant compression of the intrahepatic IVC. New right hepatic lobe lesion. Moderate pelvic ascites.  Cycle 1 FOLFIRI/PANITUMUMAB 11/04/2016  Cycle 2 FOLFIRI/PANITUMUMAB 11/18/2016  Cycle 3 FOLFIRI/panitumumab 12/02/2016  Cycle4 FOLFIRI/PANITUMUMAB 12/15/2016  Cycle 5 FOLFIRI/PANITUMUMAB 12/30/2016  CTs 01/06/2017-no evidence of progressive disease, decreased chest adenopathy, stable left lower lobe nodules, decreased liver metastasis, decreased right retroperitoneal mass  Cycle 6 FOLFIRI/panitumumab 01/13/2017  Cycle 7 FOLFIRI/Panitumumab 01/27/2017  Cycle 8 FOLFIRI/panitumumab 02/17/2017  Cycle 9 FOLFIRI/Panitumumab 03/03/2017  Cycle 10 FOLFIRI/Panitumumab 03/24/2017  CTs 04/06/2017-enlargement of right adrenal mass (  smaller than October 2019), stable left hilar fullness and lung nodules  Cycle 11 FOLFIRI/Panitumumab 04/07/2017  Cycle 12  FOLFIRI/panitumumab 04/21/2017  Cycle 13 FOLFIRI/Panitumumab 05/05/2017  Palliative radiation to the right adrenal mass 226 2019-3 02/2018  Brain MRI 07/28/2017-4intracranial metastasis in the right frontal lobe, left parietal lobe and left occipital lobe. Surrounding edema pronounced in the left occipital lobe.  SBRT to for brain lesions on 08/12/2017  CTs 08/10/2017- new left upper lobe airspace process, stable lung nodules and medius and lymphadenopathy, decreased mass involving the adrenal gland with progression of a necrotic anterior portion of the mass, no liver lesions  Cycle 14 FOLFIRI/panitumumab 08/19/2017, irinotecan dose reduced secondary to thrombocytopenia, Panitumumab dose escalated  Cycle 15 FOLFIRI/panitumumab 09/01/2017) Panitumumab dose reduced secondary to an early rash following cycle 14  CT chest 10/24/2017- progression of disease in the left lung; mild progression of left hilar nodal disease with new right hilar lymphadenopathy and progressive increase in lymph nodes in the mediastinum; interval progression of caudate lobe metastatic lesion.  Cycle 1 FOLFIRI/Panitumumab 11/03/2017  Cycle 2 FOLFIRI/Panitumumab 11/17/2017  Cycle 3 FOLFIRI/panitumumab 12/08/2017 (Panitumumab dose increased)  Cycle 4 FOLFIRI/Panitumumab 12/22/2017  Cycle 5 FOLFIRI/Panitumumab 01/05/2018  CTs 01/17/2018- new 4 cm posterior liver mass. Mild growth of separate heterogeneous 5.9 cm mass posterior margin of the liver near the IVC. Newbandlike low-attenuation focus in the posterior inferior liver, indeterminate, favoring expansile right portal vein thrombus. Stable right adrenal metastasis. Mixed response in the chest. Left hilar/AP window adenopathy mildly increased. Right paratracheal adenopathy mildly increased. Right hilar adenopathy decreased. Left lower lobe pulmonary nodule stable to mildly decreased. Tiny right upper lobe pulmonary nodule slightly increased. New 2.7 cm sub-solid  pulmonary nodule basilar right lower lobe. Waxing and waning left upper lobe nodular foci of consolidation. New mild splenomegaly.  Cycle 1 FOLFOX 02/02/2018  Brain MRI 02/13/2018-new left frontal parietal lesion, mild progression of enhancing lesion in the left occipital lobe-previously treated lesion  Cycle 2 FOLFOX 02/24/2018  SRS to 1 brain lesion 03/02/2018  Cycle 3 FOLFOX 03/09/2018  Cycle 4 FOLFOX 03/28/2018  MRI brain 04/18/2018- progression of disease with increased size in surrounding T2 signal changes involving lesions in the posterior left occipital lobe and medial posterior left parietal lobe.  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  CTs 04/19/2018- progression of left hilar mass with complete obstruction of the left mainstem bronchus.  Complete collapse of the left upper lobe and left lower lobe with fluid filling the entire left hemithorax.  Unchanged bulky mediastinal adenopathy.  2 small nodules right lung consistent with metastasis.  Interval mild increase in tumor burden right hepatic lobe.  Increase in upper abdominal periaortic adenopathy.  New left adrenal gland metastasis.  Stable right adrenal gland metastasis.  Palliative radiation to left chest 04/27/2018- 05/10/2018 2. History ofIrregular bowel habits/rectal bleeding secondary to #1. 3. History of Mild elevation of the liver enzymes. Question secondary to hepatic steatosis. 4. Indeterminate 8 mm posterior right liver lesion on the staging CT 03/06/2012. 5. Mildly elevated CEA at 5.7 on 03/06/2012. 6. History of radiation erythema at the groin and perineum 7. 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 No clinical evidence of thrombophlebitis or extravasation. 8. Delayed nausea following chemotherapy-Decadron prophylaxis was added with cycle 3 CAPOX-improved. 9. Ileostomy takedown 12/07/2012. 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 11. Port-A-Cath placement 12/31/204 12. Severe neutropenia secondary to chemotherapy following cycle 1 of FOLFIRIin 2015, chemotherapy was dose reduced and he received Neulasta with  day 15 cycle 1  13. Nausea and vomiting following cycle 1 of FOLFIRI 14. Rectal stricture-manual/colonoscopic dilatation by Jacob [name] 03/01/2016  APR 08/17/2016-no evidence of malignancy 15. History ofLeukopenia/Thrombocytopenia-persistent, potentially a sequelae of chemotherapy or hepatic toxicity from chemotherapy/radiation. Bone marrow biopsy 11/20/2014 showed cellular bone marrow with trilineage hematopoiesis. Significant dyspoiesis was not present and there was no evidence of metastatic carcinoma. Cytogenetic analysis showed the presence of normal male chromosomes with no observable clonal chromosomal abnormalities.  probable cirrhosis 16. Genetic testing-negative genetic panel in March 2014 17. 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 Severe over the face-steroid Dosepak prescribed 07/03/2015. Improved 07/10/2015. Further improved 07/24/2015, 08/07/2015, 08/28/2015 18. Diarrhea secondary to chemotherapy-encouraged to use Imodium  19. History of Paronychia secondary to PANITUMUMAB 20. 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 History of neutropenia, thrombocytopenia 06/23/2017. Possibly related to radiation. He also has a history of suspected portal hypertension secondaryto toxicity from chemotherapy 22. Seizure in the setting of brain metastases 08/05/2017, now maintained onLamictal 23. Altered mental status evaluated by neuro-oncology, Keppra discontinued, improved 24. 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  He completed palliative radiation to the left chest 05/10/2018.  He has  odynophagia secondary to radiation.  He will try Carafate slurry.  He is waiting to hear from Jacob [name] guarding the [place] testing results and systemic treatment options.  I will contact Jacob [name].  Jacob [name] will return for an office visit in 2 weeks.  Betsy Coder, MD  05/15/2018  4:16 PM

## 2018-05-15 NOTE — Progress Notes (Signed)
Reports that last port flush was 04/21/18 at [facility].

## 2018-05-29 ENCOUNTER — Inpatient Hospital Stay: Payer: BC Managed Care – PPO

## 2018-05-29 ENCOUNTER — Inpatient Hospital Stay: Payer: BC Managed Care – PPO | Attending: Oncology | Admitting: Nurse Practitioner

## 2018-05-29 ENCOUNTER — Telehealth: Payer: Self-pay | Admitting: Oncology

## 2018-05-29 VITALS — BP 119/84 | HR 87 | Temp 97.9°F | Resp 18 | Ht 70.0 in | Wt 179.4 lb

## 2018-05-29 DIAGNOSIS — R600 Localized edema: Secondary | ICD-10-CM | POA: Diagnosis not present

## 2018-05-29 DIAGNOSIS — C7931 Secondary malignant neoplasm of brain: Secondary | ICD-10-CM | POA: Diagnosis present

## 2018-05-29 DIAGNOSIS — C787 Secondary malignant neoplasm of liver and intrahepatic bile duct: Secondary | ICD-10-CM

## 2018-05-29 DIAGNOSIS — C7972 Secondary malignant neoplasm of left adrenal gland: Secondary | ICD-10-CM

## 2018-05-29 DIAGNOSIS — R110 Nausea: Secondary | ICD-10-CM | POA: Diagnosis not present

## 2018-05-29 DIAGNOSIS — C20 Malignant neoplasm of rectum: Secondary | ICD-10-CM | POA: Diagnosis not present

## 2018-05-29 DIAGNOSIS — C7971 Secondary malignant neoplasm of right adrenal gland: Secondary | ICD-10-CM | POA: Diagnosis not present

## 2018-05-29 DIAGNOSIS — Z95828 Presence of other vascular implants and grafts: Secondary | ICD-10-CM

## 2018-05-29 MED ORDER — FUROSEMIDE 20 MG PO TABS: 20.0000 mg | ORAL_TABLET | Freq: Every day | ORAL | 0 refills | Status: DC

## 2018-05-29 MED ORDER — HEPARIN SOD (PORK) LOCK FLUSH 100 UNIT/ML IV SOLN
500.0000 [IU] | Freq: Once | INTRAVENOUS | Status: AC | PRN
  Administered 2018-05-29: 500 [IU] via INTRAVENOUS
  Filled 2018-05-29: qty 5

## 2018-05-29 MED ORDER — SODIUM CHLORIDE 0.9% FLUSH
10.0000 mL | INTRAVENOUS | Status: DC | PRN
  Administered 2018-05-29: 10 mL
  Filled 2018-05-29: qty 10

## 2018-05-29 NOTE — Patient Instructions (Signed)
Trifluridine; Tipiracil oral tablets Qu es este medicamento? La TRIFLURIDINA; TIPIRACILO es un agente quimioteraputico. Desacelera el crecimiento de clulas cancerosas. Este medicamento se utiliza en el tratamiento del cncer de colon o recto, y del cncer de estmago. Este medicamento puede ser utilizado para otros usos; si tiene alguna pregunta consulte con su proveedor de atencin mdica o con su farmacutico. MARCAS COMUNES: Lonsurf Qu le debo informar a mi profesional de la salud antes de tomar este medicamento? Necesitan saber si usted presenta alguno de los siguientes problemas o situaciones: antecedentes de recuentos sanguneos bajos causados por un medicamento infeccin (especialmente infecciones virales, como varicela, fuegos labiales o herpes) enfermedad renal enfermedad heptica recuentos sanguneos bajos, como baja cantidad de glbulos blancos, plaquetas o glbulos rojos terapia de radiacin en curso o reciente una reaccin alrgica o inusual a la trifluridina, al tipiracilo, a otros medicamentos, alimentos, colorantes o conservantes si est embarazada o buscando quedar embarazada si est amamantando a un beb Cmo debo utilizar este medicamento? Este medicamento viene [employer] y su mdico podr recetarle ambas concentraciones para su dosis recetada. Siga las instrucciones de la etiqueta del [place]. Tome este medicamento por va oral con un vaso de agua. No corte, triture ni [employer]. Tome este medicamento con alimentos. Use su medicamento a intervalos regulares. No lo use con una frecuencia mayor a la indicada. Su cuidador debe usar guantes al tocar Coca-Cola. Lvese las manos despus de tocar Coca-Cola. No deje de usarlo, excepto si as lo indica su mdico. Hable con su pediatra para informarse acerca del uso de este medicamento en nios. Puede requerir atencin especial. Sobredosis: Pngase en contacto inmediatamente con un centro  toxicolgico o una sala de urgencia si usted cree que haya tomado demasiado medicamento. ATENCIN: [employer] es solo para usted. No comparta este medicamento con nadie. Qu sucede si me olvido de una dosis? Si se olvida una dosis, no tome dosis adicionales para compensar la dosis que no tom. Llame a su mdico para que le d instrucciones sobre qu hacer cuando se olvida una dosis. Qu puede interactuar con este medicamento? No se han estudiado las interacciones. Suministre a Conservation officer, historic buildings de atencin mdica una lista de todos los [place], hierbas, medicamentos de [place], o suplementos dietticos que [place]. Dgales tambin si fuma, bebe alcohol, o utiliza drogas ilegales. Algunos elementos pueden interactuar con su medicamento. Puede ser que esta lista no menciona todas las posibles interacciones. Informe a su profesional de [employer]	[place] de [employer] productos a base de hierbas, medicamentos de [place] o suplementos nutritivos que est tomando. Si usted fuma, consume bebidas alcohlicas o si utiliza drogas ilegales, indqueselo tambin a su profesional de [employer]	[place]. Algunas sustancias pueden interactuar con su medicamento. A qu debo estar atento al usar Coca-Cola? Informe a su mdico o a su profesional de la salud si sus sntomas no comienzan a mejorar o si empeoran. No debe quedar embarazada mientras est usando este medicamento o por 6 meses despus de dejar de usarlo. Las mujeres deben informar a su mdico si estn buscando quedar embarazadas o si creen que podran estar embarazadas. Los hombres no deben tener hijos mientras estn recibiendo Coca-Cola y [employer] 3 meses despus de dejar de usarlo. Existe la posibilidad de efectos secundarios graves en un beb sin nacer. Para obtener ms informacin, hable con su profesional de la salud o su farmacutico. No debe [employer] a un beb mientras est usando este medicamento o por 1 da despus  de dejar de usarlo. Evite usar  productos que contienen aspirina, acetaminofeno, ibuprofeno, naproxeno o ketoprofeno, a menos que as lo indique su mdico. Estos productos pueden ocultar la fiebre. Consulte a su mdico o a su profesional de la salud si tiene fiebre, escalofros o dolor de garganta, o cualquier otro sntoma de resfro o gripe. No se trate usted mismo. Este medicamento reduce la capacidad del cuerpo para combatir infecciones. Trate de no acercarse a personas que estn enfermas. Proceda con cuidado al cepillar sus dientes, usar hilo dental o Risk manager palillos para los dientes, ya que podra contraer una infeccin o Therapist, art con mayor facilidad. Si se somete a algn tratamiento dental, informe a su dentista que est [employer]. Este medicamento podra aumentar el riesgo de moretones o sangrado. Consulte a su mdico o a su profesional de la salud si observa sangrados inusuales. Este medicamento podra hacerle sentir un Nurse, mental health. Esto es normal, ya que la quimioterapia puede afectar tanto a las clulas sanas como a las clulas cancerosas. Si presenta algn efecto secundario, infrmelo. Contine con el tratamiento aun si se siente enfermo, a menos que su mdico le indique que lo suspenda. Qu efectos secundarios puedo tener al [employer] este medicamento? Efectos secundarios que debe informar a su mdico o a Barrister's clerk de la salud tan pronto como sea posible: Chief of Staff, como erupcin cutnea, comezn/picazn o urticarias, e hinchazn de la cara, los labios o la lengua recuentos sanguneos bajos: este medicamento podra reducir la cantidad de glbulos blancos, glbulos rojos y plaquetas. Su riesgo de infeccin y sangrado podra ser mayor. signos de disminucin en la cantidad de plaquetas o sangrado: moretones, puntos rojos en la piel, heces de color negro y aspecto alquitranado, sangre en la orina signos de disminucin en la cantidad de glbulos rojos: cansancio o debilidad inusual, sensacin de  Youth worker o aturdimiento, cadas signos de infeccin: fiebre o escalofros, tos, dolor de garganta, dolor o dificultad para orinar signos y sntomas de un cogulo sanguneo, tales como problemas respiratorios; cambios en la visin; dolor en el pecho; dolor de cabeza grave, repentino; dolor, hinchazn, calor en la pierna; dificultad para hablar; entumecimiento o debilidad repentina de la cara, el brazo o la pierna Efectos secundarios que generalmente no requieren atencin mdica (infrmelos a su mdico o a su profesional de la salud si persisten o si son molestos): diarrea prdida del apetito nuseas, vmito dolor estomacal Puede ser que esta lista no menciona todos los posibles efectos secundarios. Comunquese a su mdico por asesoramiento mdico [employer] Usted puede informar los efectos secundarios a la FDA por telfono al 1-800-FDA-1088. Dnde debo guardar mi medicina? Mantenga fuera del alcance de los nios. Guarde a una temperatura de entre 20 y 57 grados Celsius (18 y 48 grados Fahrenheit). Deseche todo el medicamento que no haya utilizado despus de la fecha de vencimiento. Si se deja fuera del envase original, deseche todas las tabletas que no haya usado despus de 30 das. ATENCIN: Este folleto es un resumen. Puede ser que no cubra toda la posible informacin. Si usted tiene preguntas acerca de esta medicina, consulte con su mdico, su farmacutico o su profesional de Technical sales engineer.  2019 Elsevier/Gold Standard (2017-07-19 00:00:00) Trifluridine; Tipiracil oral tablets What is this medicine? TRIFLURIDINE; TIPIRACIL (trye FLURE i deen; tip EER a sil) is a chemotherapy drug. It slows the growth of cancer cells. This medicine is used to treat colon or rectal cancer and stomach cancer. This medicine may be used for other  purposes; ask your health care provider or pharmacist if you have questions. COMMON BRAND NAME(S): Lonsurf What should I tell my health care provider before I  take this medicine? They need to know if you have any of these conditions: -history of low blood counts caused by a medicine -infection (especially a virus infection such as chickenpox, cold sores, or herpes) -kidney disease -liver disease -low blood counts, like low white cell, platelet, or red cell counts -recent or ongoing radiation therapy -an unusual or allergic reaction to trifluridine, tipiracil, other medicines, foods, dyes, or preservatives -pregnant or trying to get pregnant -breast-feeding How should I use this medicine? This medicine comes in two strengths, and your doctor may prescribe both strengths for your prescribed dose. Follow the directions on the prescription label. Take this medicine by mouth with a glass of water. Do not cut, crush or chew this medicine. Take this medicine with food. Take your medicine at regular intervals. Do not take it more often than directed. Your caregiver should wear gloves when handling this medicine. Wash your hands after handling this medicine. Do not stop taking except on your doctor's advice. Talk to your pediatrician regarding the use of this medicine in children. Special care may be needed. Overdosage: If you think you have taken too much of this medicine contact a poison control center or emergency room at once. NOTE: This medicine is only for you. Do not share this medicine with others. What if I miss a dose? If you miss a dose, do not take additional doses to make up for the missed dose. Call your doctor for instructions about what to do for a missed dose. What may interact with this medicine? Interactions have not been studied. Give your health care provider a list of all the medicines, herbs, non-prescription drugs, or dietary supplements you use. Also tell them if you smoke, drink alcohol, or use illegal drugs. Some items may interact with your medicine. This list may not describe all possible interactions. Give your health care  provider a list of all the medicines, herbs, non-prescription drugs, or dietary supplements you use. Also tell them if you smoke, drink alcohol, or use illegal drugs. Some items may interact with your medicine. What should I watch for while using this medicine? Tell your doctor or healthcare professional if your symptoms do not start to get better or if they get worse. Do not become pregnant while taking this medicine or for 6 months after stopping it. Women should inform their doctor if they wish to become pregnant or think they might be pregnant. Men should not father a child while taking this medicine and for 3 months after stopping it. There is a potential for serious side effects to an unborn child. Talk to your health care professional or pharmacist for more information. Do not breast-feed an infant while taking this medicine or for 1 day after stopping it. Avoid taking products that contain aspirin, acetaminophen, ibuprofen, naproxen, or ketoprofen unless instructed by your doctor. These medicines may hide a fever. Call your doctor or health care professional for advice if you get a fever, chills or sore throat, or other symptoms of a cold or flu. Do not treat yourself. This drug decreases your body's ability to fight infections. Try to avoid being around people who are sick. Be careful brushing and flossing your teeth or using a toothpick because you may get an infection or bleed more easily. If you have any dental work done, tell your dentist you  are receiving this medicine. This medicine may increase your risk to bruise or bleed. Call your doctor or health care professional if you notice any unusual bleeding. This drug may make you feel generally unwell. This is not uncommon, as chemotherapy can affect healthy cells as well as cancer cells. Report any side effects. Continue your course of treatment even though you feel ill unless your doctor tells you to stop. What side effects may I notice from  receiving this medicine? Side effects that you should report to your doctor or health care professional as soon as possible: -allergic reactions like skin rash, itching or hives, swelling of the face, lips, or tongue -low blood counts - this medicine may decrease the number of white blood cells, red blood cells and platelets. You may be at increased risk for infections and bleeding. -signs of decreased platelets or bleeding - bruising, pinpoint red spots on the skin, black, tarry stools, blood in the urine -signs of decreased red blood cells - unusually weak or tired, feeling faint or lightheaded, falls -signs of infection - fever or chills, cough, sore throat, pain or difficulty passing urine -signs and symptoms of a blood clot such as breathing problems; changes in vision; chest pain; severe, sudden headache; pain, swelling, warmth in the leg; trouble speaking; sudden numbness or weakness of the face, arm or leg Side effects that usually do not require medical attention (report to your doctor or health care professional if they continue or are bothersome): -diarrhea -loss of appetite -nausea, vomiting -stomach pain This list may not describe all possible side effects. Call your doctor for medical advice about side effects. You may report side effects to FDA at 1-800-FDA-1088. Where should I keep my medicine? Keep out of the reach of children. Store between 20 and 25 degrees C (68 and 77 degrees F). Throw away any unused medicine after the expiration date. If kept outside of the original container, throw away any unused tablets after 30 days. NOTE: This sheet is a summary. It may not cover all possible information. If you have questions about this medicine, talk to your doctor, pharmacist, or health care provider.  2019 Elsevier/Gold Standard (2017-05-19 09:25:37)

## 2018-05-29 NOTE — Telephone Encounter (Signed)
Gave patient avs report and appointments for March and April

## 2018-05-29 NOTE — Progress Notes (Addendum)
[place] OFFICE PROGRESS NOTE   Diagnosis: Rectal cancer  INTERVAL HISTORY:   Mr. Eline returns as scheduled.  He continues to have marked leg swelling.  At times he notes improvement with leg elevation.  His weight has increased significantly.  Abdomen feels full.  He has fairly constant nausea.  Good appetite overall.  Bowels are moving.  Objective:  Vital signs in last 24 hours:  Blood pressure 119/84, pulse 87, temperature 97.9 F (36.6 C), temperature source Oral, resp. rate 18, height _0  (1.778 m), weight 179 lb 6.4 oz (81.4 kg), SpO2 100 %.    HEENT: No thrush or ulcers. Resp: Lungs clear bilaterally.  Breath sounds diminished on the left.  No respiratory distress. Cardio: Regular rate and rhythm. GI: Abdomen is distended. Vascular: Lower body edema. Neuro: Alert and oriented. Port-A-Cath without erythema.  Lab Results:  Lab Results  Component Value Date   WBC 7.6 04/12/2018   HGB 9.8 (L) 04/12/2018   HCT 31.9 (L) 04/12/2018   MCV 85.8 04/12/2018   PLT 107 (L) 04/12/2018   NEUTROABS 6.3 04/12/2018    Imaging:  No results found.  Medications: I have reviewed the patient's current medications.  Assessment/Plan: 1. Rectal cancer. Partially obstructing mass noted 1-2 cm from the anal verge on a colonoscopy 03/06/2012. Endoscopic ultrasound 03/14/2012 with a 7.5 mm thick, 3.2 cm wide hypoechoic, irregularly bordered mass that clearly passed into and through the muscularis propria layer of the distal rectal wall (uT3); 3 small (largest 7 mm) perirectal lymph nodes. The lymph nodes were all round, discrete, hypoechoic, homogenous; suspicious for malignant involvement (uN1).  No RAS mutation identified by [facility] 1 testing on the colon resection specimen 06/29/2012, APC alteration identified, microsatellite stable  He began radiation and concurrent Xeloda chemotherapy on 03/20/2012, completed 04/27/2012.   Low anterior resection/coloanal  anastomosis and diverting ileostomy 06/29/2012 with the final pathology revealing a T2N0 tumor with extensive fibrosis and negative margins.   Cycle 1 of adjuvant CAPOX 07/19/2012. Cycle 5 of adjuvant CAPOX 10/11/2012.   CEA 2.5 on 11/14/2012.   CEA 14.6 03/08/2013.   Restaging CT evaluation 03/08/2013 with a 4 mm pulmonary nodule left lung base not identified on comparison exam; new liver lesions including a 29 x 26 mm irregular peripheral enhancing rounded lesion in the dome of the right hepatic lobe, a less well-defined new subcapsular lesion in the lateral right hepatic lobe measuring 12 mm, a new subcapsular lesion in the anterior right hepatic lobe adjacent to the gallbladder fossa measuring 10 mm; a rounded low-density lesion in the inferior right hepatic lobe measuring 10 mm compared to 7 mm on the prior study (radiologist commented this may represent an enlarged cyst).   MRI of the abdomen 04/12/2013 confirmed multiple T2 hyperintense metastatic lesions throughout the right liver   Initiation of FOLFIRI/Avastin with genotype based irinotecan dosing per the [facility] study 04/05/2013.   Restaging CT evaluation on 06/20/2013 (after 2 cycles/4 treatments) showed improvement in the liver metastases and stable size of a left lower lobe pulmonary nodule now with central cavitation.   Continuation of FOLFIRI/Avastin.   Restaging CT evaluation 08/14/2013-decrease in the size of liver metastases, slight decrease in the size of a cavitary left lower lobe nodule, no evidence of disease progression.   Status post right hepatic lobectomy 09/17/2013. Pathology showed multiple foci of metastatic adenocarcinoma (5 nodules of metastatic adenocarcinoma 4 of which are subcapsular with the nodules ranging in size from 0.6-1.8 cm in greatest dimension). Margins  not involved. Biopsy of a portal lymph node showed benign adipose tissue; no lymph node tissue or malignancy.   Normal CEA 11/06/2013   CT  11/06/2013 with a new pleural-based right lower chest lesion, slight in enlargement of the a left-sided lung nodule  CT 01/29/2014 with a decrease in the right lower chest pleural-based lesion and a slight increase of a left-sided lung lesion, other lung lesions were stable  CT chest 05/02/2014 with a decrease in the size of a right lower lobe pulmonary nodule months similar size of a dominant left lower lobe nodule, minimal enlargement of a smaller left lower lobe nodule, no new site of disease  CT chest 11/07/2014 with a slight increase in several left-sided lung nodules  CT chest 02/10/2015 with new small left hilar lymph nodes, a possible new left lower lobe nodule, and stability of other lung nodules  PET scan 03/12/2015 with hypermetabolic left hilar nodes, hypermetabolic left lower lobe nodules, hyper metabolic retroperitoneal nodes, intense hypermetabolism at the coloanal anastomosis, and hypermetabolic thickening in the presacral space  Cycle 1 FOLFIRI/PANITUMUMAB 05/21/2015  Cycle 2 FOLFIRI/PANITUMUMAB 06/05/2015  Cycle 3 FOLFIRI/PANITUMUMAB 06/19/2015  Cycle 4 FOLFIRI 07/10/2015  Cycle 5 FOLFIRI 07/24/2015  Restaging CTs 08/06/2015-resolution of hilar/retroperitoneal adenopathy, improvement in the hypermetabolic lung nodule, other lung nodules are stable, no new lesions  Cycle 6 FOLFIRI with PANITUMUMAB 08/28/2015 -panitumumab dose reduced  Cycle 7 FOLFIRI 09/11/2015-no panitumumab given  Cycle 8 FOLFIRI with PANITUMUMAB 09/25/2015-PANITUMUMAB dose reduced  Cycle 9 FOLFIRI 10/09/2015-no PANITUMUMAB given  Cycle 10 FOLFIRI with panitumumab 10/23/2015  CTs 11/06/2015-possible slight enlargement of a left lower lobe nodule, no other evidence of disease progression.  CTs 02/16/2016-enlargement of left-sided pulmonary nodules and mediastinal/left hilar nodes, improved splenomegaly  CTs 06/21/2016-increased size of mediastinal/left hilar nodes, increased left lung nodules,  and increased soft tissue at the porta hepatis  CTs 10/28/2016-progressive disease in the chest and abdomen with an enlarging left hilar mass and slight interval enlargement of pulmonary lesions. Right upper quadrant necrotic nodal mass significantly increased in size with mass effect on the liver and possible invasion. Significant compression of the intrahepatic IVC. New right hepatic lobe lesion. Moderate pelvic ascites.  Cycle 1 FOLFIRI/PANITUMUMAB 11/04/2016  Cycle 2 FOLFIRI/PANITUMUMAB 11/18/2016  Cycle 3 FOLFIRI/panitumumab 12/02/2016  Cycle4 FOLFIRI/PANITUMUMAB 12/15/2016  Cycle 5 FOLFIRI/PANITUMUMAB 12/30/2016  CTs 01/06/2017-no evidence of progressive disease, decreased chest adenopathy, stable left lower lobe nodules, decreased liver metastasis, decreased right retroperitoneal mass  Cycle 6 FOLFIRI/panitumumab 01/13/2017  Cycle 7 FOLFIRI/Panitumumab 01/27/2017  Cycle 8 FOLFIRI/panitumumab 02/17/2017  Cycle 9 FOLFIRI/Panitumumab 03/03/2017  Cycle 10 FOLFIRI/Panitumumab 03/24/2017  CTs 04/06/2017-enlargement of right adrenal mass (smaller than October 2019), stable left hilar fullness and lung nodules  Cycle 11 FOLFIRI/Panitumumab 04/07/2017  Cycle 12 FOLFIRI/panitumumab 04/21/2017  Cycle 13 FOLFIRI/Panitumumab 05/05/2017  Palliative radiation to the right adrenal mass 226 2019-3 02/2018  Brain MRI 07/28/2017-4intracranial metastasis in the right frontal lobe, left parietal lobe and left occipital lobe. Surrounding edema pronounced in the left occipital lobe.  SBRT to for brain lesions on 08/12/2017  CTs 08/10/2017- new left upper lobe airspace process, stable lung nodules and medius and lymphadenopathy, decreased mass involving the adrenal gland with progression of a necrotic anterior portion of the mass, no liver lesions  Cycle 14 FOLFIRI/panitumumab 08/19/2017, irinotecan dose reduced secondary to thrombocytopenia, Panitumumab dose escalated  Cycle 15  FOLFIRI/panitumumab 09/01/2017) Panitumumab dose reduced secondary to an early rash following cycle 14  CT chest 10/24/2017- progression of disease in the left lung; mild progression  of left hilar nodal disease with new right hilar lymphadenopathy and progressive increase in lymph nodes in the mediastinum; interval progression of caudate lobe metastatic lesion.  Cycle 1 FOLFIRI/Panitumumab 11/03/2017  Cycle 2 FOLFIRI/Panitumumab 11/17/2017  Cycle 3 FOLFIRI/panitumumab 12/08/2017 (Panitumumab dose increased)  Cycle 4 FOLFIRI/Panitumumab 12/22/2017  Cycle 5 FOLFIRI/Panitumumab 01/05/2018  CTs 01/17/2018- new 4 cm posterior liver mass. Mild growth of separate heterogeneous 5.9 cm mass posterior margin of the liver near the IVC. Newbandlike low-attenuation focus in the posterior inferior liver, indeterminate, favoring expansile right portal vein thrombus. Stable right adrenal metastasis. Mixed response in the chest. Left hilar/AP window adenopathy mildly increased. Right paratracheal adenopathy mildly increased. Right hilar adenopathy decreased. Left lower lobe pulmonary nodule stable to mildly decreased. Tiny right upper lobe pulmonary nodule slightly increased. New 2.7 cm sub-solid pulmonary nodule basilar right lower lobe. Waxing and waning left upper lobe nodular foci of consolidation. New mild splenomegaly.  Cycle 1 FOLFOX 02/02/2018  Brain MRI 02/13/2018-new left frontal parietal lesion, mild progression of enhancing lesion in the left occipital lobe-previously treated lesion  Cycle 2 FOLFOX 02/24/2018  SRS to 1 brain lesion 03/02/2018  Cycle 3 FOLFOX 03/09/2018  Cycle 4 FOLFOX 03/28/2018  MRI brain 04/18/2018- progression of disease with increased size in surrounding T2 signal changes involving lesions in the posterior left occipital lobe and medial posterior left parietal lobe.  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  CTs 04/19/2018- progression of left hilar mass with complete  obstruction of the left mainstem bronchus.  Complete collapse of the left upper lobe and left lower lobe with fluid filling the entire left hemithorax.  Unchanged bulky mediastinal adenopathy.  2 small nodules right lung consistent with metastasis.  Interval mild increase in tumor burden right hepatic lobe.  Increase in upper abdominal periaortic adenopathy.  New left adrenal gland metastasis.  Stable right adrenal gland metastasis.  Palliative radiation to left chest 04/27/2018- 05/10/2018 2. History ofIrregular bowel habits/rectal bleeding secondary to #1. 3. History of Mild elevation of the liver enzymes. Question secondary to hepatic steatosis. 4. Indeterminate 8 mm posterior right liver lesion on the staging CT 03/06/2012. 5. Mildly elevated CEA at 5.7 on 03/06/2012. 6. History of radiation erythema at the groin and perineum 7. 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 No clinical evidence of thrombophlebitis or extravasation. 8. Delayed nausea following chemotherapy-Decadron prophylaxis was added with cycle 3 CAPOX-improved. 9. Ileostomy takedown 12/07/2012. 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 11. Port-A-Cath placement 12/31/204 12. Severe neutropenia secondary to chemotherapy following cycle 1 of FOLFIRIin 2015, chemotherapy was dose reduced and he received Neulasta with day 15 cycle 1  13. Nausea and vomiting following cycle 1 of FOLFIRI 14. Rectal stricture-manual/colonoscopic dilatation by Dr. Ardis Hughs 03/01/2016  APR 08/17/2016-no evidence of malignancy 15. History ofLeukopenia/Thrombocytopenia-persistent, potentially a sequelae of chemotherapy or hepatic toxicity from chemotherapy/radiation. Bone marrow biopsy 11/20/2014 showed cellular bone marrow with trilineage hematopoiesis. Significant dyspoiesis was not present and there was no evidence of metastatic carcinoma. Cytogenetic analysis showed  the presence of normal male chromosomes with no observable clonal chromosomal abnormalities.  probable cirrhosis 16. Genetic testing-negative genetic panel in March 2014 17. 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 Severe over the face-steroid Dosepak prescribed 07/03/2015. Improved 07/10/2015. Further improved 07/24/2015, 08/07/2015, 08/28/2015 18. Diarrhea secondary to chemotherapy-encouraged to use Imodium  19. History of Paronychia secondary to PANITUMUMAB 20. 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 History of neutropenia, thrombocytopenia 06/23/2017. Possibly related to radiation. He also has a history of suspected portal hypertension secondaryto toxicity from chemotherapy 22. Seizure in the setting of brain metastases 08/05/2017, now maintained onLamictal 23. Altered mental status evaluated by neuro-oncology, Keppra discontinued, improved 24. 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 Lower body edema, multifactorial.  Trial of Lasix initiated 05/29/2018  Disposition: Mr. Emmanuel appears unchanged.  He is being considered for a clinical trial at [facility].  He will need to undergo another biopsy.  Depending on the timeframe for the clinical trial he is interested in beginning [place].  We reviewed potential toxicities including bone marrow toxicity, nausea, diarrhea, rash.  He was provided with written information as well.  He will return for baseline labs 05/30/2018.  We will see him in follow-up on 06/09/2018.  For the edema he will begin a trial of Lasix.  Patient seen with Dr. Benay Spice.  25 minutes were spent face-to-face at today's visit with the majority of that time involved in counseling/coordination of care.    Ned Card ANP/GNP-BC   05/29/2018  12:43 PM  This was a shared visit with Ned Card.  Mr. Arata appears  stable.  We discussed treatment options.  He is awaiting to hear from [facility] regarding a repeat biopsy to undergo additional molecular testing.  He may be eligible for a clinical trial based on the tumor molecular subtype.  We discussed standard treatment options with Lonsurf and regular afternoon.  The plan is to begin a trial of Lonsurf while he is waiting to determine clinical trial eligibility.  He will return for baseline labs later this week.  We reviewed potential toxicities associated with Lonsurf.  He agrees to proceed.  Julieanne Manson, MD

## 2018-05-30 ENCOUNTER — Telehealth: Payer: Self-pay | Admitting: Pharmacist

## 2018-05-30 ENCOUNTER — Inpatient Hospital Stay: Payer: BC Managed Care – PPO

## 2018-05-30 ENCOUNTER — Telehealth: Payer: Self-pay

## 2018-05-30 DIAGNOSIS — C20 Malignant neoplasm of rectum: Secondary | ICD-10-CM | POA: Diagnosis not present

## 2018-05-30 DIAGNOSIS — C787 Secondary malignant neoplasm of liver and intrahepatic bile duct: Principal | ICD-10-CM

## 2018-05-30 DIAGNOSIS — Z95828 Presence of other vascular implants and grafts: Secondary | ICD-10-CM

## 2018-05-30 LAB — CBC WITH DIFFERENTIAL (CANCER CENTER ONLY)
Abs Immature Granulocytes: 0.03 10*3/uL (ref 0.00–0.07)
Basophils Absolute: 0 10*3/uL (ref 0.0–0.1)
Basophils Relative: 0 %
EOS ABS: 0 10*3/uL (ref 0.0–0.5)
EOS PCT: 1 %
HCT: 31.2 % — ABNORMAL LOW (ref 39.0–52.0)
Hemoglobin: 9.5 g/dL — ABNORMAL LOW (ref 13.0–17.0)
Immature Granulocytes: 1 %
Lymphocytes Relative: 8 %
Lymphs Abs: 0.2 10*3/uL — ABNORMAL LOW (ref 0.7–4.0)
MCH: 26.2 pg (ref 26.0–34.0)
MCHC: 30.4 g/dL (ref 30.0–36.0)
MCV: 86 fL (ref 80.0–100.0)
Monocytes Absolute: 0.4 10*3/uL (ref 0.1–1.0)
Monocytes Relative: 14 %
Neutro Abs: 2.4 10*3/uL (ref 1.7–7.7)
Neutrophils Relative %: 76 %
PLATELETS: 75 10*3/uL — AB (ref 150–400)
RBC: 3.63 MIL/uL — ABNORMAL LOW (ref 4.22–5.81)
RDW: 20.6 % — AB (ref 11.5–15.5)
WBC: 3.1 10*3/uL — AB (ref 4.0–10.5)
nRBC: 0 % (ref 0.0–0.2)

## 2018-05-30 LAB — CMP (CANCER CENTER ONLY)
ALT: 30 U/L (ref 0–44)
ANION GAP: 11 (ref 5–15)
AST: 29 U/L (ref 15–41)
Albumin: 3.3 g/dL — ABNORMAL LOW (ref 3.5–5.0)
Alkaline Phosphatase: 137 U/L — ABNORMAL HIGH (ref 38–126)
BUN: 14 mg/dL (ref 6–20)
CHLORIDE: 106 mmol/L (ref 98–111)
CO2: 24 mmol/L (ref 22–32)
Calcium: 8.9 mg/dL (ref 8.9–10.3)
Creatinine: 0.79 mg/dL (ref 0.61–1.24)
GFR, Est AFR Am: >60 mL/min (ref >60)
GFR, Estimated: >60 mL/min (ref >60)
Glucose, Bld: 89 mg/dL (ref 70–99)
Potassium: 3.8 mmol/L (ref 3.5–5.1)
Sodium: 141 mmol/L (ref 135–145)
Total Bilirubin: 0.7 mg/dL (ref 0.3–1.2)
Total Protein: 6.1 g/dL — ABNORMAL LOW (ref 6.5–8.1)

## 2018-05-30 LAB — CEA (IN HOUSE-CHCC): CEA (CHCC-In House): 252.75 ng/mL — ABNORMAL HIGH (ref 0.00–5.00)

## 2018-05-30 MED ORDER — TRIFLURIDINE-TIPIRACIL 20-8.19 MG PO TABS: ORAL_TABLET | ORAL | 0 refills | Status: DC

## 2018-05-30 MED ORDER — TRIFLURIDINE-TIPIRACIL 15-6.14 MG PO TABS: ORAL_TABLET | ORAL | 0 refills | Status: DC

## 2018-05-30 MED ORDER — SODIUM CHLORIDE 0.9% FLUSH
10.0000 mL | INTRAVENOUS | Status: DC | PRN
  Administered 2018-05-30: 10 mL
  Filled 2018-05-30: qty 10

## 2018-05-30 MED ORDER — HEPARIN SOD (PORK) LOCK FLUSH 100 UNIT/ML IV SOLN
500.0000 [IU] | Freq: Once | INTRAVENOUS | Status: AC | PRN
  Administered 2018-05-30: 500 [IU] via INTRAVENOUS
  Filled 2018-05-30: qty 5

## 2018-05-30 NOTE — Telephone Encounter (Addendum)
Oral Oncology Pharmacist Encounter  Received new prescription for Lonsurf (trifluridine/tipiracil) for the treatment of metastatic rectal cancer, K-ras WT, previously treated with fluoropyrimidine, oxaliplatin, irinotecan, bevacizumab, and panitumumab, planned duration until disease progression or unacceptable toxicity.  Original diagnosis in December 2013 Patient underwent neoadjuvant treatment with radiation and capecitabine 03/20/2012-04/27/2012 Low anterior resection was performed 06/29/2012 Patient then underwent adjuvant treatment with CapeOx 07/19/2012-10/11/2012 Patient was found to have metastatic lesions in the liver in January 2015 and started treatment on FOLFIRI plus Avastin given from January 2015-December 2016 Progression was again noted and patient initiated treatment with FOLFIRI plus panitumumab March 2017 through October 2019, with a few treatment breaks Progression in the brain was noted in November 2019 and patient was started on FOLFOX chemotherapy in November 2019 through January 2020 FOLFOX chemotherapy has been stopped with evidence of progression Patient is now under evaluation to initiate treatment with Lonsurf as well as possible clinical trial involvement  Labs from 05/30/2018 assessed, okay for treatment. Noted baseline thrombocytopenia, pltc=79k, will continue to be monitored during treatment Noted dose adjustment and Lonsurf due to [place] is planned to be administered at ~22.5 mg/m2 given by mouth twice daily, within 1 hour of food, on days 1-5 and 8-12 of each 28-day cycle  Current medication list in Epic reviewed, no DDIs with Lonsurf identified:  Prescription has been e-scribed to the [employer] for benefits analysis and approval.  Oral Oncology Clinic will continue to follow for insurance authorization, copayment issues, initial counseling and start date.  Johny Drilling, PharmD, BCPS, BCOP  05/30/2018 12:30 PM Oral Oncology  Clinic [phone]

## 2018-05-30 NOTE — Telephone Encounter (Signed)
Oral Oncology Patient Advocate Encounter  Received notification from [employer] that prior authorization for Lonsurf is required.  PA submitted on CoverMyMeds Key [id number] Status is pending  Oral Oncology Clinic will continue to follow.  [place] Patient [place] Phone [phone] Fax [phone] 05/30/2018    1:21 PM

## 2018-05-31 NOTE — Telephone Encounter (Signed)
Oral Oncology Patient Advocate Encounter  Prior Authorization for Frankey Poot has been approved.    PA# [id number] Effective dates: 05/30/18 through 05/30/19  Oral Oncology Clinic will continue to follow.   [place] Patient [place] Phone [phone] Fax [phone] 05/31/2018    8:16 AM

## 2018-05-31 NOTE — Telephone Encounter (Signed)
Oral Chemotherapy Pharmacist Encounter     I spoke with patient's wife, [name], for overview of: Lonsurf (trifluridine/tipiracil) for the treatment of metastatic rectal cancer, K-ras WT, previously treated with fluoropyrimidine, oxaliplatin, irinotecan, bevacizumab, and panitumumab, planned duration until disease progression or unacceptable toxicity.    Counseled on administration, dosing, side effects, monitoring, drug-food interactions, safe handling, storage, and disposal.   Patient will take Lonsurf 75m (trifluridine component) tablets, 3 tablets (427mtrifluridine) by mouth twice daily, within 1 hour hour of finishing AM & PM meals, on days 1-5 and days 8-12, every 28 days.  Patient will take Lonsurf M-F, Saturday and Sunday off days, Lonsurf M-F again, then no tablets for the next 2 weeks. Patient will start D1 of each cycle on Mondays.  Lonsurf start date: 06/05/2018   Adverse effects include but are not limited to: fatigue, nausea, vomiting, diarrhea, and decreased blood counts.    Patient has anti-emetic on hand and knows to take it if nausea develops.   Patient will obtain anti diarrheal and alert the office of 4 or more loose stools above baseline.  BeMelynda Kellerpdated about CBC check on Cycle 1 Day 14.   Reviewed importance of keeping a medication schedule and plan for any missed doses. Berta informed Lonsurf dose has been decreased due to platelet count and current status and that dose may increase in the future if patient tolerates very well and performance status improves.   BeMelynda Kelleroiced understanding and appreciation.    All questions answered.  Medication reconciliation performed and medication/allergy list updated.  Insurance authorization has been approved. Test claim at the [facility] [employer] copayment $0 for the 1st fill of Lonsurf.  Lonsurf will sip from the pharmacy tomorrow, 06/01/18, for delivery to patient's home on Friday, 3/13. Patient will  start [place] Monday 3/16.  BeMelynda Kellernformed that the pharmacy will call them ~1 week prior to needing next fill of Lonsurf to coordinate medication acquisition to ensure patient does not have a break in therapy.  They will continue to explore options for clinical trial in the meantime.   They know to call the office with questions or concerns. Oral Oncology Clinic will continue to follow.   JeJohny DrillingPharmD, BCPS, BCOP  05/31/2018   12:50 PM Oral Oncology Clinic [phone]

## 2018-05-31 NOTE — Telephone Encounter (Signed)
Will need nadir cbc approx. D15

## 2018-06-01 MED FILL — LONSURF 15 MG-6.14 MG TAB: 15-6.14 | 28 days supply | Qty: 60 | Fill #0

## 2018-06-02 ENCOUNTER — Other Ambulatory Visit: Payer: Self-pay | Admitting: Family Medicine

## 2018-06-02 ENCOUNTER — Other Ambulatory Visit: Payer: Self-pay | Admitting: Internal Medicine

## 2018-06-02 NOTE — Telephone Encounter (Signed)
Requesting:Marinol Contract: no UDS:n/a Last OV:03/27/18 Next OV: 07/10/18 Last Refill: 12/19/17  #[id number] Database:   Please advise

## 2018-06-02 NOTE — Telephone Encounter (Signed)
Oral Oncology Patient Advocate Encounter  Confirmed with [place] that Jacob [name] was shipped on 06/01/18 with a $0 copay.   [place] Patient [place] Phone [phone] Fax [phone] 06/02/2018   9:24 AM

## 2018-06-05 NOTE — Telephone Encounter (Signed)
Please advise 

## 2018-06-06 ENCOUNTER — Telehealth: Payer: Self-pay | Admitting: Radiation Oncology

## 2018-06-06 NOTE — Telephone Encounter (Signed)
The patient called back and said he's doing fairly well. He reports he is concerned about getting sick given the shape his lungs are in. I asked if he had an incentive spirometer at home and he thinks he does. We reviewed the use and utility of this device and discussed trying to use this once an hour every waking hour if he can remember. He will call me back if he does not have this device and we will try to locate one for him. He will see Dr. Benay Spice later this week and is due for a brain scan in April and to follow up with Dr. Mickeal Skinner. We will see him moving forward as needed.

## 2018-06-06 NOTE — Telephone Encounter (Signed)
I called and LM for the patient and let him know to call back so we could discuss how he's doing since completing radiation. Given the concerns of the coronavirus, we are cancelling nonurgent follow up appointment and I let him know we'd cancel his appt this Thursday but could speak by phone for follow up.

## 2018-06-08 ENCOUNTER — Ambulatory Visit: Payer: Self-pay | Admitting: Radiation Oncology

## 2018-06-08 NOTE — Progress Notes (Signed)
  Radiation Oncology         [phone] ________________________________  Name: Jacob [name] MRN: [id number]  Date: 05/10/2018  DOB: [birth year]  End of Treatment Note  Diagnosis:   48 y.o. 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   No complaints of pain or skin irritation. He endorses shortness of breath at rest and with activity. He states that he coughed up about 4 blood clots this week that were dark in color. Following this, he states his breathing was better.   Plan: The patient has completed radiation treatment. The patient will return to radiation oncology clinic for routine followup in one month. I advised them to call or return sooner if they have any questions or concerns related to their recovery or treatment.  ------------------------------------------------  Jodelle Gross, MD, PhD  This document serves as a record of services personally performed by Kyung Rudd, MD. It was created on his behalf by Rae Lips, a trained medical scribe. The creation of this record is based on the scribe's personal observations and the provider's statements to them. This document has been checked and approved by the attending provider.

## 2018-06-09 ENCOUNTER — Inpatient Hospital Stay (HOSPITAL_BASED_OUTPATIENT_CLINIC_OR_DEPARTMENT_OTHER): Payer: BC Managed Care – PPO | Admitting: Oncology

## 2018-06-09 ENCOUNTER — Other Ambulatory Visit: Payer: Self-pay

## 2018-06-09 VITALS — BP 130/87 | HR 73 | Temp 98.5°F | Resp 18 | Ht 70.0 in | Wt 168.3 lb

## 2018-06-09 DIAGNOSIS — R0600 Dyspnea, unspecified: Secondary | ICD-10-CM

## 2018-06-09 DIAGNOSIS — C20 Malignant neoplasm of rectum: Secondary | ICD-10-CM | POA: Diagnosis not present

## 2018-06-09 DIAGNOSIS — C7972 Secondary malignant neoplasm of left adrenal gland: Secondary | ICD-10-CM

## 2018-06-09 DIAGNOSIS — C7971 Secondary malignant neoplasm of right adrenal gland: Secondary | ICD-10-CM | POA: Diagnosis not present

## 2018-06-09 DIAGNOSIS — R5381 Other malaise: Secondary | ICD-10-CM

## 2018-06-09 DIAGNOSIS — C7931 Secondary malignant neoplasm of brain: Secondary | ICD-10-CM | POA: Diagnosis not present

## 2018-06-09 DIAGNOSIS — R110 Nausea: Secondary | ICD-10-CM

## 2018-06-09 DIAGNOSIS — C787 Secondary malignant neoplasm of liver and intrahepatic bile duct: Secondary | ICD-10-CM | POA: Diagnosis not present

## 2018-06-09 DIAGNOSIS — R194 Change in bowel habit: Secondary | ICD-10-CM

## 2018-06-09 DIAGNOSIS — R600 Localized edema: Secondary | ICD-10-CM

## 2018-06-09 NOTE — Progress Notes (Signed)
[place] OFFICE PROGRESS NOTE   Diagnosis: Rectal cancer  INTERVAL HISTORY:   Jacob [name] returns for a scheduled visit.  He began Lonsurf on 06/05/2018.  He has noted mild nausea since starting Lonsurf.  He continues to have irregular bowel habits.  He reports dyspnea and malaise.  He is staying in bed more.  He is scheduled for a liver biopsy at [facility] on 06/16/2018.  Leg edema has improved with furosemide.  Objective:  Vital signs in last 24 hours:  Blood pressure 130/87, pulse 73, temperature 98.5 F (36.9 C), temperature source Oral, resp. rate 18, height '5\' 10"'  (1.778 m), weight 168 lb 4.8 oz (76.3 kg), SpO2 99 %.    HEENT: No thrush or ulcers Resp: Lungs clear bilaterally, no respiratory distress Cardio: Regular rate and rhythm GI: No hepatomegaly, nontender, no mass Vascular: 1+ pitting edema at the lower leg bilaterally  Portacath/PICC-without erythema  Lab Results:  Lab Results  Component Value Date   WBC 3.1 (L) 05/30/2018   HGB 9.5 (L) 05/30/2018   HCT 31.2 (L) 05/30/2018   MCV 86.0 05/30/2018   PLT 75 (L) 05/30/2018   NEUTROABS 2.4 05/30/2018    CMP  Lab Results  Component Value Date   NA 141 05/30/2018   K 3.8 05/30/2018   CL 106 05/30/2018   CO2 24 05/30/2018   GLUCOSE 89 05/30/2018   BUN 14 05/30/2018   CREATININE 0.79 05/30/2018   CALCIUM 8.9 05/30/2018   PROT 6.1 (L) 05/30/2018   ALBUMIN 3.3 (L) 05/30/2018   AST 29 05/30/2018   ALT 30 05/30/2018   ALKPHOS 137 (H) 05/30/2018   BILITOT 0.7 05/30/2018   GFRNONAA >60 05/30/2018   GFRAA >60 05/30/2018    Lab Results  Component Value Date   CEA1 252.75 (H) 05/30/2018    Lab Results  Component Value Date   INR 1.05 11/20/2014    Imaging:  No results found.  Medications: I have reviewed the patient's current medications.   Assessment/Plan: . Rectal cancer. Partially obstructing mass noted 1-2 cm from the anal verge on a colonoscopy 03/06/2012. Endoscopic ultrasound  03/14/2012 with a 7.5 mm thick, 3.2 cm wide hypoechoic, irregularly bordered mass that clearly passed into and through the muscularis propria layer of the distal rectal wall (uT3); 3 small (largest 7 mm) perirectal lymph nodes. The lymph nodes were all round, discrete, hypoechoic, homogenous; suspicious for malignant involvement (uN1).  No RAS mutation identified by [facility] 1 testing on the colon resection specimen 06/29/2012, APC alteration identified, microsatellite stable  He began radiation and concurrent Xeloda chemotherapy on 03/20/2012, completed 04/27/2012.   Low anterior resection/coloanal anastomosis and diverting ileostomy 06/29/2012 with the final pathology revealing a T2N0 tumor with extensive fibrosis and negative margins.   Cycle 1 of adjuvant CAPOX 07/19/2012. Cycle 5 of adjuvant CAPOX 10/11/2012.   CEA 2.5 on 11/14/2012.   CEA 14.6 03/08/2013.   Restaging CT evaluation 03/08/2013 with a 4 mm pulmonary nodule left lung base not identified on comparison exam; new liver lesions including a 29 x 26 mm irregular peripheral enhancing rounded lesion in the dome of the right hepatic lobe, a less well-defined new subcapsular lesion in the lateral right hepatic lobe measuring 12 mm, a new subcapsular lesion in the anterior right hepatic lobe adjacent to the gallbladder fossa measuring 10 mm; a rounded low-density lesion in the inferior right hepatic lobe measuring 10 mm compared to 7 mm on the prior study (radiologist commented this may represent an enlarged  cyst).   MRI of the abdomen 04/12/2013 confirmed multiple T2 hyperintense metastatic lesions throughout the right liver   Initiation of FOLFIRI/Avastin with genotype based irinotecan dosing per the [facility] study 04/05/2013.   Restaging CT evaluation on 06/20/2013 (after 2 cycles/4 treatments) showed improvement in the liver metastases and stable size of a left lower lobe pulmonary nodule now with central cavitation.    Continuation of FOLFIRI/Avastin.   Restaging CT evaluation 08/14/2013-decrease in the size of liver metastases, slight decrease in the size of a cavitary left lower lobe nodule, no evidence of disease progression.   Status post right hepatic lobectomy 09/17/2013. Pathology showed multiple foci of metastatic adenocarcinoma (5 nodules of metastatic adenocarcinoma 4 of which are subcapsular with the nodules ranging in size from 0.6-1.8 cm in greatest dimension). Margins not involved. Biopsy of a portal lymph node showed benign adipose tissue; no lymph node tissue or malignancy.   Normal CEA 11/06/2013   CT 11/06/2013 with a new pleural-based right lower chest lesion, slight in enlargement of the a left-sided lung nodule  CT 01/29/2014 with a decrease in the right lower chest pleural-based lesion and a slight increase of a left-sided lung lesion, other lung lesions were stable  CT chest 05/02/2014 with a decrease in the size of a right lower lobe pulmonary nodule months similar size of a dominant left lower lobe nodule, minimal enlargement of a smaller left lower lobe nodule, no new site of disease  CT chest 11/07/2014 with a slight increase in several left-sided lung nodules  CT chest 02/10/2015 with new small left hilar lymph nodes, a possible new left lower lobe nodule, and stability of other lung nodules  PET scan 03/12/2015 with hypermetabolic left hilar nodes, hypermetabolic left lower lobe nodules, hyper metabolic retroperitoneal nodes, intense hypermetabolism at the coloanal anastomosis, and hypermetabolic thickening in the presacral space  Cycle 1 FOLFIRI/PANITUMUMAB 05/21/2015  Cycle 2 FOLFIRI/PANITUMUMAB 06/05/2015  Cycle 3 FOLFIRI/PANITUMUMAB 06/19/2015  Cycle 4 FOLFIRI 07/10/2015  Cycle 5 FOLFIRI 07/24/2015  Restaging CTs 08/06/2015-resolution of hilar/retroperitoneal adenopathy, improvement in the hypermetabolic lung nodule, other lung nodules are stable, no new lesions   Cycle 6 FOLFIRI with PANITUMUMAB 08/28/2015 -panitumumab dose reduced  Cycle 7 FOLFIRI 09/11/2015-no panitumumab given  Cycle 8 FOLFIRI with PANITUMUMAB 09/25/2015-PANITUMUMAB dose reduced  Cycle 9 FOLFIRI 10/09/2015-no PANITUMUMAB given  Cycle 10 FOLFIRI with panitumumab 10/23/2015  CTs 11/06/2015-possible slight enlargement of a left lower lobe nodule, no other evidence of disease progression.  CTs 02/16/2016-enlargement of left-sided pulmonary nodules and mediastinal/left hilar nodes, improved splenomegaly  CTs 06/21/2016-increased size of mediastinal/left hilar nodes, increased left lung nodules, and increased soft tissue at the porta hepatis  CTs 10/28/2016-progressive disease in the chest and abdomen with an enlarging left hilar mass and slight interval enlargement of pulmonary lesions. Right upper quadrant necrotic nodal mass significantly increased in size with mass effect on the liver and possible invasion. Significant compression of the intrahepatic IVC. New right hepatic lobe lesion. Moderate pelvic ascites.  Cycle 1 FOLFIRI/PANITUMUMAB 11/04/2016  Cycle 2 FOLFIRI/PANITUMUMAB 11/18/2016  Cycle 3 FOLFIRI/panitumumab 12/02/2016  Cycle4 FOLFIRI/PANITUMUMAB 12/15/2016  Cycle 5 FOLFIRI/PANITUMUMAB 12/30/2016  CTs 01/06/2017-no evidence of progressive disease, decreased chest adenopathy, stable left lower lobe nodules, decreased liver metastasis, decreased right retroperitoneal mass  Cycle 6 FOLFIRI/panitumumab 01/13/2017  Cycle 7 FOLFIRI/Panitumumab 01/27/2017  Cycle 8 FOLFIRI/panitumumab 02/17/2017  Cycle 9 FOLFIRI/Panitumumab 03/03/2017  Cycle 10 FOLFIRI/Panitumumab 03/24/2017  CTs 04/06/2017-enlargement of right adrenal mass (smaller than October 2019), stable left hilar fullness and lung nodules  Cycle  11 FOLFIRI/Panitumumab 04/07/2017  Cycle 12 FOLFIRI/panitumumab 04/21/2017  Cycle 13 FOLFIRI/Panitumumab 05/05/2017  Palliative radiation to the right adrenal mass  226 2019-3 02/2018  Brain MRI 07/28/2017-4intracranial metastasis in the right frontal lobe, left parietal lobe and left occipital lobe. Surrounding edema pronounced in the left occipital lobe.  SBRT to for brain lesions on 08/12/2017  CTs 08/10/2017- new left upper lobe airspace process, stable lung nodules and medius and lymphadenopathy, decreased mass involving the adrenal gland with progression of a necrotic anterior portion of the mass, no liver lesions  Cycle 14 FOLFIRI/panitumumab 08/19/2017, irinotecan dose reduced secondary to thrombocytopenia, Panitumumab dose escalated  Cycle 15 FOLFIRI/panitumumab 09/01/2017) Panitumumab dose reduced secondary to an early rash following cycle 14  CT chest 10/24/2017- progression of disease in the left lung; mild progression of left hilar nodal disease with new right hilar lymphadenopathy and progressive increase in lymph nodes in the mediastinum; interval progression of caudate lobe metastatic lesion.  Cycle 1 FOLFIRI/Panitumumab 11/03/2017  Cycle 2 FOLFIRI/Panitumumab 11/17/2017  Cycle 3 FOLFIRI/panitumumab 12/08/2017 (Panitumumab dose increased)  Cycle 4 FOLFIRI/Panitumumab 12/22/2017  Cycle 5 FOLFIRI/Panitumumab 01/05/2018  CTs 01/17/2018- new 4 cm posterior liver mass. Mild growth of separate heterogeneous 5.9 cm mass posterior margin of the liver near the IVC. Newbandlike low-attenuation focus in the posterior inferior liver, indeterminate, favoring expansile right portal vein thrombus. Stable right adrenal metastasis. Mixed response in the chest. Left hilar/AP window adenopathy mildly increased. Right paratracheal adenopathy mildly increased. Right hilar adenopathy decreased. Left lower lobe pulmonary nodule stable to mildly decreased. Tiny right upper lobe pulmonary nodule slightly increased. New 2.7 cm sub-solid pulmonary nodule basilar right lower lobe. Waxing and waning left upper lobe nodular foci of consolidation. New mild  splenomegaly.  Cycle 1 FOLFOX 02/02/2018  Brain MRI 02/13/2018-new left frontal parietal lesion, mild progression of enhancing lesion in the left occipital lobe-previously treated lesion  Cycle 2 FOLFOX 02/24/2018  SRS to 1 brain lesion 03/02/2018  Cycle 3 FOLFOX 03/09/2018  Cycle 4 FOLFOX 03/28/2018  MRI brain 04/18/2018- progression of disease with increased size in surrounding T2 signal changes involving lesions in the posterior left occipital lobe and medial posterior left parietal lobe.  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  CTs 04/19/2018- progression of left hilar mass with complete obstruction of the left mainstem bronchus.  Complete collapse of the left upper lobe and left lower lobe with fluid filling the entire left hemithorax.  Unchanged bulky mediastinal adenopathy.  2 small nodules right lung consistent with metastasis.  Interval mild increase in tumor burden right hepatic lobe.  Increase in upper abdominal periaortic adenopathy.  New left adrenal gland metastasis.  Stable right adrenal gland metastasis.  Palliative radiation to left chest 04/27/2018- 05/10/2018  Cycle 1 once or 06/05/2018 2. History ofIrregular bowel habits/rectal bleeding secondary to #1. 3. History of Mild elevation of the liver enzymes. Question secondary to hepatic steatosis. 4. Indeterminate 8 mm posterior right liver lesion on the staging CT 03/06/2012. 5. Mildly elevated CEA at 5.7 on 03/06/2012. 6. History of radiation erythema at the groin and perineum 7. 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 No clinical evidence of thrombophlebitis or extravasation. 8. Delayed nausea following chemotherapy-Decadron prophylaxis was added with cycle 3 CAPOX-improved. 9. Ileostomy takedown 12/07/2012. 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 11. Port-A-Cath placement 12/31/204 12. Severe neutropenia secondary to chemotherapy following  cycle 1 of FOLFIRIin 2015, chemotherapy was dose reduced and he received Neulasta with day 15 cycle 1  13. Nausea  and vomiting following cycle 1 of FOLFIRI 14. Rectal stricture-manual/colonoscopic dilatation by Dr. Ardis Hughs 03/01/2016  APR 08/17/2016-no evidence of malignancy 15. History ofLeukopenia/Thrombocytopenia-persistent, potentially a sequelae of chemotherapy or hepatic toxicity from chemotherapy/radiation. Bone marrow biopsy 11/20/2014 showed cellular bone marrow with trilineage hematopoiesis. Significant dyspoiesis was not present and there was no evidence of metastatic carcinoma. Cytogenetic analysis showed the presence of normal male chromosomes with no observable clonal chromosomal abnormalities.  probable cirrhosis 16. Genetic testing-negative genetic panel in March 2014 17. 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 Severe over the face-steroid Dosepak prescribed 07/03/2015. Improved 07/10/2015. Further improved 07/24/2015, 08/07/2015, 08/28/2015 18. Diarrhea secondary to chemotherapy-encouraged to use Imodium  19. History of Paronychia secondary to PANITUMUMAB 20. 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 History of neutropenia, thrombocytopenia 06/23/2017. Possibly related to radiation. He also has a history of suspected portal hypertension secondaryto toxicity from chemotherapy 22. Seizure in the setting of brain metastases 08/05/2017, now maintained onLamictal 23. Altered mental status evaluated by neuro-oncology, Keppra discontinued, improved 24. 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 Lower body edema, multifactorial.  Trial of Lasix initiated 05/29/2018    Disposition: Jacob [name] appears unchanged.  He will complete the current cycle of Lonsurf next week.  He is scheduled for a liver biopsy at [facility] next week  to to get current tissue for testing.  He is being considered for a clinical trial.  He will return for an office and lab visit 06/23/2018.  25 minutes were spent with the patient today.  The majority of the time was used for counseling and coordination of care.  Betsy Coder, MD  06/09/2018  8:51 AM

## 2018-06-12 ENCOUNTER — Encounter: Payer: Self-pay | Admitting: Oncology

## 2018-06-13 ENCOUNTER — Telehealth: Payer: Self-pay

## 2018-06-13 ENCOUNTER — Telehealth: Payer: Self-pay | Admitting: Oncology

## 2018-06-13 NOTE — Telephone Encounter (Signed)
Spoke with patient re 4/3 appointments,

## 2018-06-13 NOTE — Telephone Encounter (Signed)
Nutrition Assessment   Reason for Assessment:   Request from provider to call patient.   ASSESSMENT:  48 year old male with metastatic rectal cancer.  Noted taking lonsurf.  Planning liver biopsy at [facility] on 3/27.  Maybe candidate for clinical trial.  Past medical history of low anterior resection/coloanal anastomosis and diverting ileostomy.  Followed by Dr. Mickeal Skinner and Dr. Benay Spice.    Called patient via phone.  Patient reports that feels like appetite is fairly good.  Reports new chemo has caused some nausea but not as bad as other chemo treatments.  Reports that he has been trying to eat 3 meals per day.  Breakfast this am was 2 eggs and grits, getting ready to eat soup for lunch now.  Reports that he is taking dronabinol currently    Nutrition Focused Physical Exam: deferred   Medications: dronabinol   Labs: reviewed   Anthropometrics:   Height: 70 inches Weight: 168 lb 4.8 oz on 3/20 UBW: unable to answer.  Noted weight on 3/9 of 179 lb and patient reports fluid weight gain.  2/24 noted weight of 165 lb.  Can't tell RD dry wt. Reports weight loss due to fluid loss, still has fluid accumulation per pt   BMI: 24   Estimated Energy Needs  Kcals: 1900-2280 Protein: 95-114 g Fluid: per MD   NUTRITION DIAGNOSIS: Inadequate oral intake related to cancer related treatment side effects as evidenced by report of some nausea   INTERVENTION:  Discussed strategies briefly to help with adding more calories and protein.   Encouraged patient to continue to eat 3 meals per day with snacks.   Contact information given to patient and will call RD if needed.  No follow-up at this time   MONITORING, EVALUATION, GOAL: Patient will consume adequate calories and protein to meet nutritional needs.   Next Visit: no follow-up, pt to reach out to RD as needed  Carianne Taira B. Zenia Resides, [place], [place] Registered Dietitian [phone] (pager)

## 2018-06-23 ENCOUNTER — Telehealth: Payer: Self-pay | Admitting: Nurse Practitioner

## 2018-06-23 ENCOUNTER — Inpatient Hospital Stay: Payer: BC Managed Care – PPO | Attending: Oncology | Admitting: Nurse Practitioner

## 2018-06-23 ENCOUNTER — Encounter: Payer: Self-pay | Admitting: Nurse Practitioner

## 2018-06-23 ENCOUNTER — Inpatient Hospital Stay: Payer: BC Managed Care – PPO

## 2018-06-23 ENCOUNTER — Other Ambulatory Visit: Payer: Self-pay

## 2018-06-23 ENCOUNTER — Other Ambulatory Visit: Payer: BC Managed Care – PPO

## 2018-06-23 VITALS — BP 128/88 | HR 92 | Temp 98.6°F | Resp 18 | Ht 70.0 in | Wt 169.3 lb

## 2018-06-23 DIAGNOSIS — C20 Malignant neoplasm of rectum: Secondary | ICD-10-CM

## 2018-06-23 DIAGNOSIS — R600 Localized edema: Secondary | ICD-10-CM | POA: Diagnosis not present

## 2018-06-23 DIAGNOSIS — D61818 Other pancytopenia: Secondary | ICD-10-CM | POA: Diagnosis not present

## 2018-06-23 DIAGNOSIS — R5383 Other fatigue: Secondary | ICD-10-CM

## 2018-06-23 DIAGNOSIS — C787 Secondary malignant neoplasm of liver and intrahepatic bile duct: Secondary | ICD-10-CM | POA: Insufficient documentation

## 2018-06-23 DIAGNOSIS — R945 Abnormal results of liver function studies: Secondary | ICD-10-CM | POA: Diagnosis not present

## 2018-06-23 DIAGNOSIS — C7971 Secondary malignant neoplasm of right adrenal gland: Secondary | ICD-10-CM | POA: Insufficient documentation

## 2018-06-23 DIAGNOSIS — C7972 Secondary malignant neoplasm of left adrenal gland: Secondary | ICD-10-CM | POA: Insufficient documentation

## 2018-06-23 DIAGNOSIS — H539 Unspecified visual disturbance: Secondary | ICD-10-CM | POA: Insufficient documentation

## 2018-06-23 DIAGNOSIS — C7931 Secondary malignant neoplasm of brain: Secondary | ICD-10-CM | POA: Insufficient documentation

## 2018-06-23 DIAGNOSIS — R52 Pain, unspecified: Secondary | ICD-10-CM

## 2018-06-23 LAB — CMP (CANCER CENTER ONLY)
ALT: 99 U/L — ABNORMAL HIGH (ref 0–44)
AST: 60 U/L — ABNORMAL HIGH (ref 15–41)
Albumin: 3.4 g/dL — ABNORMAL LOW (ref 3.5–5.0)
Alkaline Phosphatase: 241 U/L — ABNORMAL HIGH (ref 38–126)
Anion gap: 9 (ref 5–15)
BUN: 16 mg/dL (ref 6–20)
CO2: 21 mmol/L — ABNORMAL LOW (ref 22–32)
Calcium: 8.6 mg/dL — ABNORMAL LOW (ref 8.9–10.3)
Chloride: 106 mmol/L (ref 98–111)
Creatinine: 0.77 mg/dL (ref 0.61–1.24)
GFR, Est AFR Am: >60 mL/min (ref >60)
GFR, Estimated: >60 mL/min (ref >60)
Glucose, Bld: 124 mg/dL — ABNORMAL HIGH (ref 70–99)
Potassium: 3.8 mmol/L (ref 3.5–5.1)
Sodium: 136 mmol/L (ref 135–145)
Total Bilirubin: 1.8 mg/dL — ABNORMAL HIGH (ref 0.3–1.2)
Total Protein: 6.2 g/dL — ABNORMAL LOW (ref 6.5–8.1)

## 2018-06-23 LAB — CBC WITH DIFFERENTIAL (CANCER CENTER ONLY)
Abs Immature Granulocytes: 0.05 10*3/uL (ref 0.00–0.07)
Basophils Absolute: 0 10*3/uL (ref 0.0–0.1)
Basophils Relative: 0 %
Eosinophils Absolute: 0 10*3/uL (ref 0.0–0.5)
Eosinophils Relative: 1 %
HCT: 32.8 % — ABNORMAL LOW (ref 39.0–52.0)
Hemoglobin: 10.1 g/dL — ABNORMAL LOW (ref 13.0–17.0)
Immature Granulocytes: 2 %
Lymphocytes Relative: 7 %
Lymphs Abs: 0.2 10*3/uL — ABNORMAL LOW (ref 0.7–4.0)
MCH: 28 pg (ref 26.0–34.0)
MCHC: 30.8 g/dL (ref 30.0–36.0)
MCV: 90.9 fL (ref 80.0–100.0)
Monocytes Absolute: 0.4 10*3/uL (ref 0.1–1.0)
Monocytes Relative: 16 %
Neutro Abs: 1.6 10*3/uL — ABNORMAL LOW (ref 1.7–7.7)
Neutrophils Relative %: 74 %
Platelet Count: 74 10*3/uL — ABNORMAL LOW (ref 150–400)
RBC: 3.61 MIL/uL — ABNORMAL LOW (ref 4.22–5.81)
RDW: 22.6 % — ABNORMAL HIGH (ref 11.5–15.5)
WBC Count: 2.2 10*3/uL — ABNORMAL LOW (ref 4.0–10.5)
nRBC: 0 % (ref 0.0–0.2)

## 2018-06-23 MED ORDER — TRIFLURIDINE-TIPIRACIL 15-6.14 MG PO TABS: ORAL_TABLET | ORAL | 0 refills | Status: DC

## 2018-06-23 NOTE — Telephone Encounter (Signed)
Scheduled appt per 4/3 los.

## 2018-06-23 NOTE — Progress Notes (Addendum)
[place] OFFICE PROGRESS NOTE   Diagnosis: Rectal cancer  INTERVAL HISTORY:   Jacob [name] returns as scheduled.  He began cycle 1 Lonsurf 06/05/2018.  He denies significant nausea/vomiting, mouth sores, diarrhea.  He has felt fairly well over the past 2 to 3 days.  Main complaint is fatigue.  Leg swelling continues to be improved.  He has pain at various locations intermittently.  Last night he had right neck and shoulder pain.  This has resolved.  Sometimes the pain is located in the low back region or the abdomen.  He increased dexamethasone to 3 mg recently due to progressive visual change.  Objective:  Vital signs in last 24 hours:  Blood pressure 128/88, pulse 92, temperature 98.6 F (37 C), temperature source Oral, resp. rate 18, height _0  (1.778 m), weight 169 lb 4.8 oz (76.8 kg), SpO2 100 %.    HEENT: No thrush or ulcers. Resp: Respirations even and unlabored. GI: Left lower quadrant colostomy. Vascular: Pitting edema at the lower legs bilaterally.  Skin: Palms without erythema. Port-A-Cath without erythema.   Lab Results:  Lab Results  Component Value Date   WBC 2.2 (L) 06/23/2018   HGB 10.1 (L) 06/23/2018   HCT 32.8 (L) 06/23/2018   MCV 90.9 06/23/2018   PLT 74 (L) 06/23/2018   NEUTROABS 1.6 (L) 06/23/2018    Imaging:  No results found.  Medications: I have reviewed the patient's current medications.  Assessment/Plan: .Rectal cancer. Partially obstructing mass noted 1-2 cm from the anal verge on a colonoscopy 03/06/2012. Endoscopic ultrasound 03/14/2012 with a 7.5 mm thick, 3.2 cm wide hypoechoic, irregularly bordered mass that clearly passed into and through the muscularis propria layer of the distal rectal wall (uT3); 3 small (largest 7 mm) perirectal lymph nodes. The lymph nodes were all round, discrete, hypoechoic, homogenous; suspicious for malignant involvement (uN1).  No RAS mutation identified by [facility] 1 testing on the colon  resection specimen 06/29/2012, APC alteration identified, microsatellite stable  He began radiation and concurrent Xeloda chemotherapy on 03/20/2012, completed 04/27/2012.   Low anterior resection/coloanal anastomosis and diverting ileostomy 06/29/2012 with the final pathology revealing a T2N0 tumor with extensive fibrosis and negative margins.   Cycle 1 of adjuvant CAPOX 07/19/2012. Cycle 5 of adjuvant CAPOX 10/11/2012.   CEA 2.5 on 11/14/2012.   CEA 14.6 03/08/2013.   Restaging CT evaluation 03/08/2013 with a 4 mm pulmonary nodule left lung base not identified on comparison exam; new liver lesions including a 29 x 26 mm irregular peripheral enhancing rounded lesion in the dome of the right hepatic lobe, a less well-defined new subcapsular lesion in the lateral right hepatic lobe measuring 12 mm, a new subcapsular lesion in the anterior right hepatic lobe adjacent to the gallbladder fossa measuring 10 mm; a rounded low-density lesion in the inferior right hepatic lobe measuring 10 mm compared to 7 mm on the prior study (radiologist commented this may represent an enlarged cyst).   MRI of the abdomen 04/12/2013 confirmed multiple T2 hyperintense metastatic lesions throughout the right liver   Initiation of FOLFIRI/Avastin with genotype based irinotecan dosing per the [facility] study 04/05/2013.   Restaging CT evaluation on 06/20/2013 (after 2 cycles/4 treatments) showed improvement in the liver metastases and stable size of a left lower lobe pulmonary nodule now with central cavitation.   Continuation of FOLFIRI/Avastin.   Restaging CT evaluation 08/14/2013-decrease in the size of liver metastases, slight decrease in the size of a cavitary left lower lobe nodule, no evidence  of disease progression.   Status post right hepatic lobectomy 09/17/2013. Pathology showed multiple foci of metastatic adenocarcinoma (5 nodules of metastatic adenocarcinoma 4 of which are subcapsular with the  nodules ranging in size from 0.6-1.8 cm in greatest dimension). Margins not involved. Biopsy of a portal lymph node showed benign adipose tissue; no lymph node tissue or malignancy.   Normal CEA 11/06/2013   CT 11/06/2013 with a new pleural-based right lower chest lesion, slight in enlargement of the a left-sided lung nodule  CT 01/29/2014 with a decrease in the right lower chest pleural-based lesion and a slight increase of a left-sided lung lesion, other lung lesions were stable  CT chest 05/02/2014 with a decrease in the size of a right lower lobe pulmonary nodule months similar size of a dominant left lower lobe nodule, minimal enlargement of a smaller left lower lobe nodule, no new site of disease  CT chest 11/07/2014 with a slight increase in several left-sided lung nodules  CT chest 02/10/2015 with new small left hilar lymph nodes, a possible new left lower lobe nodule, and stability of other lung nodules  PET scan 03/12/2015 with hypermetabolic left hilar nodes, hypermetabolic left lower lobe nodules, hyper metabolic retroperitoneal nodes, intense hypermetabolism at the coloanal anastomosis, and hypermetabolic thickening in the presacral space  Cycle 1 FOLFIRI/PANITUMUMAB 05/21/2015  Cycle 2 FOLFIRI/PANITUMUMAB 06/05/2015  Cycle 3 FOLFIRI/PANITUMUMAB 06/19/2015  Cycle 4 FOLFIRI 07/10/2015  Cycle 5 FOLFIRI 07/24/2015  Restaging CTs 08/06/2015-resolution of hilar/retroperitoneal adenopathy, improvement in the hypermetabolic lung nodule, other lung nodules are stable, no new lesions  Cycle 6 FOLFIRI with PANITUMUMAB 08/28/2015 -panitumumab dose reduced  Cycle 7 FOLFIRI 09/11/2015-no panitumumab given  Cycle 8 FOLFIRI with PANITUMUMAB 09/25/2015-PANITUMUMAB dose reduced  Cycle 9 FOLFIRI 10/09/2015-no PANITUMUMAB given  Cycle 10 FOLFIRI with panitumumab 10/23/2015  CTs 11/06/2015-possible slight enlargement of a left lower lobe nodule, no other evidence of disease  progression.  CTs 02/16/2016-enlargement of left-sided pulmonary nodules and mediastinal/left hilar nodes, improved splenomegaly  CTs 06/21/2016-increased size of mediastinal/left hilar nodes, increased left lung nodules, and increased soft tissue at the porta hepatis  CTs 10/28/2016-progressive disease in the chest and abdomen with an enlarging left hilar mass and slight interval enlargement of pulmonary lesions. Right upper quadrant necrotic nodal mass significantly increased in size with mass effect on the liver and possible invasion. Significant compression of the intrahepatic IVC. New right hepatic lobe lesion. Moderate pelvic ascites.  Cycle 1 FOLFIRI/PANITUMUMAB 11/04/2016  Cycle 2 FOLFIRI/PANITUMUMAB 11/18/2016  Cycle 3 FOLFIRI/panitumumab 12/02/2016  Cycle4 FOLFIRI/PANITUMUMAB 12/15/2016  Cycle 5 FOLFIRI/PANITUMUMAB 12/30/2016  CTs 01/06/2017-no evidence of progressive disease, decreased chest adenopathy, stable left lower lobe nodules, decreased liver metastasis, decreased right retroperitoneal mass  Cycle 6 FOLFIRI/panitumumab 01/13/2017  Cycle 7 FOLFIRI/Panitumumab 01/27/2017  Cycle 8 FOLFIRI/panitumumab 02/17/2017  Cycle 9 FOLFIRI/Panitumumab 03/03/2017  Cycle 10 FOLFIRI/Panitumumab 03/24/2017  CTs 04/06/2017-enlargement of right adrenal mass (smaller than October 2019), stable left hilar fullness and lung nodules  Cycle 11 FOLFIRI/Panitumumab 04/07/2017  Cycle 12 FOLFIRI/panitumumab 04/21/2017  Cycle 13 FOLFIRI/Panitumumab 05/05/2017  Palliative radiation to the right adrenal mass 226 2019-3 02/2018  Brain MRI 07/28/2017-4intracranial metastasis in the right frontal lobe, left parietal lobe and left occipital lobe. Surrounding edema pronounced in the left occipital lobe.  SBRT to for brain lesions on 08/12/2017  CTs 08/10/2017-new left upper lobe airspace process, stable lung nodules and medius and lymphadenopathy, decreased mass involving the adrenal gland with  progression of a necrotic anterior portion of the mass, no liver lesions  Cycle 14  FOLFIRI/panitumumab 08/19/2017, irinotecan dose reduced secondary to thrombocytopenia, Panitumumab dose escalated  Cycle 15 FOLFIRI/panitumumab 09/01/2017) Panitumumab dose reduced secondary to an early rash following cycle 14  CT chest 10/24/2017-progression of disease in the left lung; mild progression of left hilar nodal disease with new right hilar lymphadenopathy and progressive increase in lymph nodes in the mediastinum; interval progression of caudate lobe metastatic lesion.  Cycle 1 FOLFIRI/Panitumumab 11/03/2017  Cycle 2 FOLFIRI/Panitumumab 11/17/2017  Cycle 3 FOLFIRI/panitumumab 12/08/2017 (Panitumumab dose increased)  Cycle 4 FOLFIRI/Panitumumab 12/22/2017  Cycle 5 FOLFIRI/Panitumumab 01/05/2018  CTs 01/17/2018- new 4 cm posterior liver mass. Mild growth of separate heterogeneous 5.9 cm mass posterior margin of the liver near the IVC. Newbandlike low-attenuation focus in the posterior inferior liver, indeterminate, favoring expansile right portal vein thrombus. Stable right adrenal metastasis. Mixed response in the chest. Left hilar/AP window adenopathy mildly increased. Right paratracheal adenopathy mildly increased. Right hilar adenopathy decreased. Left lower lobe pulmonary nodule stable to mildly decreased. Tiny right upper lobe pulmonary nodule slightly increased. New 2.7 cm sub-solid pulmonary nodule basilar right lower lobe. Waxing and waning left upper lobe nodular foci of consolidation. New mild splenomegaly.  Cycle 1 FOLFOX 02/02/2018  Brain MRI 02/13/2018-new left frontal parietal lesion, mild progression of enhancing lesion in the left occipital lobe-previously treated lesion  Cycle 2 FOLFOX 02/24/2018  SRS to 1 brain lesion 03/02/2018  Cycle 3 FOLFOX 03/09/2018  Cycle 4 FOLFOX 03/28/2018  MRI brain 04/18/2018- progression of disease with increased size in surrounding T2 signal  changes involving lesions in the posterior left occipital lobe and medial posterior left parietal lobe. 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  CTs 04/19/2018- progression of left hilar mass with complete obstruction of the left mainstem bronchus. Complete collapse of the left upper lobe and left lower lobe with fluid filling the entire left hemithorax. Unchanged bulky mediastinal adenopathy. 2 small nodules right lung consistent with metastasis. Interval mild increase in tumor burden right hepatic lobe. Increase in upper abdominal periaortic adenopathy. New left adrenal gland metastasis. Stable right adrenal gland metastasis.  Palliative radiation to left chest 04/27/2018- 05/10/2018  Cycle 1 Lonsurf 06/05/2018 2. History ofIrregular bowel habits/rectal bleeding secondary to #1. 3. History of Mild elevation of the liver enzymes. Question secondary to hepatic steatosis. 4. Indeterminate 8 mm posterior right liver lesion on the staging CT 03/06/2012. 5. Mildly elevated CEA at 5.7 on 03/06/2012. 6. History of radiation erythema at the groin and perineum 7. 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 No clinical evidence of thrombophlebitis or extravasation. 8. Delayed nausea following chemotherapy-Decadron prophylaxis was added with cycle 3 CAPOX-improved. 9. Ileostomy takedown 12/07/2012. 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 11. Port-A-Cath placement 12/31/204 12. Severe neutropenia secondary to chemotherapy following cycle 1 of FOLFIRIin 2015, chemotherapy was dose reduced and he received Neulasta with day 15 cycle 1  13. Nausea and vomiting following cycle 1 of FOLFIRI 14. Rectal stricture-manual/colonoscopic dilatation by Dr. Ardis Hughs 03/01/2016  APR 08/17/2016-no evidence of malignancy 15. History ofLeukopenia/Thrombocytopenia-persistent, potentially a sequelae of chemotherapy or hepatic toxicity from  chemotherapy/radiation. Bone marrow biopsy 11/20/2014 showed cellular bone marrow with trilineage hematopoiesis. Significant dyspoiesis was not present and there was no evidence of metastatic carcinoma. Cytogenetic analysis showed the presence of normal male chromosomes with no observable clonal chromosomal abnormalities.  probable cirrhosis 16. Genetic testing-negative genetic panel in March 2014 17. 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 Severe over the face-steroid Dosepak prescribed 07/03/2015. Improved 07/10/2015. Further improved 07/24/2015, 08/07/2015, 08/28/2015 18. Diarrhea secondary  to chemotherapy-encouraged to use Imodium  19. History of Paronychia secondary to PANITUMUMAB 20. 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 History of neutropenia, thrombocytopenia 06/23/2017. Possibly related to radiation. He also has a history of suspected portal hypertension secondaryto toxicity from chemotherapy 22. Seizure in the setting of brain metastases 08/05/2017, now maintained onLamictal 23. Altered mental status evaluated by neuro-oncology, Keppra discontinued, improved 24. 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 Lower body edema, multifactorial.  Trial of Lasix initiated 05/29/2018   Disposition: Jacob [name] appears stable.  He has completed 1 cycle of Lonsurf.  Overall he seems to have tolerated well.  We reviewed the labs from today.  He has mild pancytopenia.  Precautions reviewed.  He understands to contact the office with fever, chills, other signs of infection, bleeding.  We discussed the newly elevated LFTs.  We will repeat a chemistry panel and CBC when he returns in 1 week.  He will return for lab and follow-up on 06/30/2018.  He will contact the office in the interim with any problems.  Patient seen with Dr.  Benay Spice.    Ned Card ANP/GNP-BC   06/23/2018  1:02 PM  This was a shared visit with Ned Card.  Jacob [name] appears unchanged.  He decided against the liver biopsy at [facility].  Julieanne Manson, MD

## 2018-06-28 MED FILL — LONSURF 15 MG-6.14 MG TAB: 15-6.14 | 28 days supply | Qty: 60 | Fill #0

## 2018-06-30 ENCOUNTER — Other Ambulatory Visit: Payer: Self-pay

## 2018-06-30 ENCOUNTER — Inpatient Hospital Stay: Payer: BC Managed Care – PPO

## 2018-06-30 ENCOUNTER — Inpatient Hospital Stay (HOSPITAL_BASED_OUTPATIENT_CLINIC_OR_DEPARTMENT_OTHER): Payer: BC Managed Care – PPO | Admitting: Oncology

## 2018-06-30 DIAGNOSIS — C20 Malignant neoplasm of rectum: Secondary | ICD-10-CM

## 2018-06-30 DIAGNOSIS — K5900 Constipation, unspecified: Secondary | ICD-10-CM

## 2018-06-30 DIAGNOSIS — R251 Tremor, unspecified: Secondary | ICD-10-CM

## 2018-06-30 DIAGNOSIS — C7931 Secondary malignant neoplasm of brain: Secondary | ICD-10-CM

## 2018-06-30 DIAGNOSIS — M7989 Other specified soft tissue disorders: Secondary | ICD-10-CM

## 2018-06-30 DIAGNOSIS — C7971 Secondary malignant neoplasm of right adrenal gland: Secondary | ICD-10-CM | POA: Diagnosis not present

## 2018-06-30 DIAGNOSIS — H539 Unspecified visual disturbance: Secondary | ICD-10-CM

## 2018-06-30 DIAGNOSIS — C7972 Secondary malignant neoplasm of left adrenal gland: Secondary | ICD-10-CM

## 2018-06-30 DIAGNOSIS — R748 Abnormal levels of other serum enzymes: Secondary | ICD-10-CM

## 2018-06-30 DIAGNOSIS — C787 Secondary malignant neoplasm of liver and intrahepatic bile duct: Secondary | ICD-10-CM

## 2018-06-30 LAB — CBC WITH DIFFERENTIAL (CANCER CENTER ONLY)
Abs Immature Granulocytes: 0.02 10*3/uL (ref 0.00–0.07)
Basophils Absolute: 0 10*3/uL (ref 0.0–0.1)
Basophils Relative: 0 %
Eosinophils Absolute: 0 10*3/uL (ref 0.0–0.5)
Eosinophils Relative: 1 %
HCT: 35.7 % — ABNORMAL LOW (ref 39.0–52.0)
Hemoglobin: 10.8 g/dL — ABNORMAL LOW (ref 13.0–17.0)
Immature Granulocytes: 1 %
Lymphocytes Relative: 10 %
Lymphs Abs: 0.3 10*3/uL — ABNORMAL LOW (ref 0.7–4.0)
MCH: 28.3 pg (ref 26.0–34.0)
MCHC: 30.3 g/dL (ref 30.0–36.0)
MCV: 93.5 fL (ref 80.0–100.0)
Monocytes Absolute: 0.5 10*3/uL (ref 0.1–1.0)
Monocytes Relative: 20 %
Neutro Abs: 1.6 10*3/uL — ABNORMAL LOW (ref 1.7–7.7)
Neutrophils Relative %: 68 %
Platelet Count: 96 10*3/uL — ABNORMAL LOW (ref 150–400)
RBC: 3.82 MIL/uL — ABNORMAL LOW (ref 4.22–5.81)
RDW: 24.4 % — ABNORMAL HIGH (ref 11.5–15.5)
WBC Count: 2.4 10*3/uL — ABNORMAL LOW (ref 4.0–10.5)
nRBC: 0 % (ref 0.0–0.2)

## 2018-06-30 LAB — CMP (CANCER CENTER ONLY)
ALT: 78 U/L — ABNORMAL HIGH (ref 0–44)
AST: 72 U/L — ABNORMAL HIGH (ref 15–41)
Albumin: 3.3 g/dL — ABNORMAL LOW (ref 3.5–5.0)
Alkaline Phosphatase: 294 U/L — ABNORMAL HIGH (ref 38–126)
Anion gap: 10 (ref 5–15)
BUN: 17 mg/dL (ref 6–20)
CO2: 24 mmol/L (ref 22–32)
Calcium: 9 mg/dL (ref 8.9–10.3)
Chloride: 103 mmol/L (ref 98–111)
Creatinine: 0.84 mg/dL (ref 0.61–1.24)
GFR, Est AFR Am: >60 mL/min (ref >60)
GFR, Estimated: >60 mL/min (ref >60)
Glucose, Bld: 82 mg/dL (ref 70–99)
Potassium: 3.8 mmol/L (ref 3.5–5.1)
Sodium: 137 mmol/L (ref 135–145)
Total Bilirubin: 2.2 mg/dL — ABNORMAL HIGH (ref 0.3–1.2)
Total Protein: 6.4 g/dL — ABNORMAL LOW (ref 6.5–8.1)

## 2018-06-30 LAB — CEA (IN HOUSE-CHCC): CEA (CHCC-In House): 693.34 ng/mL — ABNORMAL HIGH (ref 0.00–5.00)

## 2018-06-30 MED ORDER — PROCHLORPERAZINE MALEATE 10 MG PO TABS: 10.0000 mg | ORAL_TABLET | Freq: Four times a day (QID) | ORAL | 1 refills | Status: AC | PRN

## 2018-06-30 MED ORDER — FUROSEMIDE 20 MG PO TABS: 20.0000 mg | ORAL_TABLET | Freq: Every day | ORAL | 2 refills | Status: AC

## 2018-06-30 MED ORDER — LAMOTRIGINE 100 MG PO TABS: 100.0000 mg | ORAL_TABLET | Freq: Two times a day (BID) | ORAL | 2 refills | Status: AC

## 2018-06-30 NOTE — Progress Notes (Signed)
Jacob [name] OFFICE PROGRESS NOTE   Diagnosis: Rectal cancer  INTERVAL HISTORY:   Jacob [name] returns as scheduled.  Jacob [name] reports constipation this week.  Jacob [name] continues to have leg swelling.  Jacob [name] has noted fullness in the abdomen.  The visual field disturbance progressed when Jacob [name] discontinued Decadron.  Jacob [name] has noticed bilateral hand "tremors "this week.  No seizures.  Objective:  Vital signs in last 24 hours:  Blood pressure 124/71, pulse 86, temperature 98.1 F (36.7 C), temperature source Oral, resp. rate 18, height '5\' 10"'  (1.778 m), weight 170 lb 9.6 oz (77.4 kg), SpO2 99 %.     GI: Soft, no hepatomegaly, nontender Vascular: 1-2+ pitting edema at the lower leg bilaterally Neuro: No hand tremor  Portacath/PICC-without erythema  Lab Results:  Lab Results  Component Value Date   WBC 2.4 (L) 06/30/2018   HGB 10.8 (L) 06/30/2018   HCT 35.7 (L) 06/30/2018   MCV 93.5 06/30/2018   PLT 96 (L) 06/30/2018   NEUTROABS 1.6 (L) 06/30/2018    CMP  Lab Results  Component Value Date   NA 137 06/30/2018   K 3.8 06/30/2018   CL 103 06/30/2018   CO2 24 06/30/2018   GLUCOSE 82 06/30/2018   BUN 17 06/30/2018   CREATININE 0.84 06/30/2018   CALCIUM 9.0 06/30/2018   PROT 6.4 (L) 06/30/2018   ALBUMIN 3.3 (L) 06/30/2018   AST 72 (H) 06/30/2018   ALT 78 (H) 06/30/2018   ALKPHOS 294 (H) 06/30/2018   BILITOT 2.2 (H) 06/30/2018   GFRNONAA >60 06/30/2018   GFRAA >60 06/30/2018    Lab Results  Component Value Date   CEA1 693.34 (H) 06/30/2018    Medications: I have reviewed the patient's current medications.   Assessment/Plan: 1. Rectal cancer. Partially obstructing mass noted 1-2 cm from the anal verge on a colonoscopy 03/06/2012. Endoscopic ultrasound 03/14/2012 with a 7.5 mm thick, 3.2 cm wide hypoechoic, irregularly bordered mass that clearly passed into and through the muscularis propria layer of the distal rectal wall (uT3); 3 small (largest 7 mm) perirectal lymph  nodes. The lymph nodes were all round, discrete, hypoechoic, homogenous; suspicious for malignant involvement (uN1).  No RAS mutation identified by [facility] 1 testing on the colon resection specimen 06/29/2012, APC alteration identified, microsatellite stable  Jacob [name] began radiation and concurrent Xeloda chemotherapy on 03/20/2012, completed 04/27/2012.   Low anterior resection/coloanal anastomosis and diverting ileostomy 06/29/2012 with the final pathology revealing a T2N0 tumor with extensive fibrosis and negative margins.   Cycle 1 of adjuvant CAPOX 07/19/2012. Cycle 5 of adjuvant CAPOX 10/11/2012.   CEA 2.5 on 11/14/2012.   CEA 14.6 03/08/2013.   Restaging CT evaluation 03/08/2013 with a 4 mm pulmonary nodule left lung base not identified on comparison exam; new liver lesions including a 29 x 26 mm irregular peripheral enhancing rounded lesion in the dome of the right hepatic lobe, a less well-defined new subcapsular lesion in the lateral right hepatic lobe measuring 12 mm, a new subcapsular lesion in the anterior right hepatic lobe adjacent to the gallbladder fossa measuring 10 mm; a rounded low-density lesion in the inferior right hepatic lobe measuring 10 mm compared to 7 mm on the prior study (radiologist commented this may represent an enlarged cyst).   MRI of the abdomen 04/12/2013 confirmed multiple T2 hyperintense metastatic lesions throughout the right liver   Initiation of FOLFIRI/Avastin with genotype based irinotecan dosing per the [facility] study 04/05/2013.   Restaging CT evaluation on 06/20/2013 (after 2 cycles/4  treatments) showed improvement in the liver metastases and stable size of a left lower lobe pulmonary nodule now with central cavitation.   Continuation of FOLFIRI/Avastin.   Restaging CT evaluation 08/14/2013-decrease in the size of liver metastases, slight decrease in the size of a cavitary left lower lobe nodule, no evidence of disease progression.   Status  post right hepatic lobectomy 09/17/2013. Pathology showed multiple foci of metastatic adenocarcinoma (5 nodules of metastatic adenocarcinoma 4 of which are subcapsular with the nodules ranging in size from 0.6-1.8 cm in greatest dimension). Margins not involved. Biopsy of a portal lymph node showed benign adipose tissue; no lymph node tissue or malignancy.   Normal CEA 11/06/2013   CT 11/06/2013 with a new pleural-based right lower chest lesion, slight in enlargement of the a left-sided lung nodule  CT 01/29/2014 with a decrease in the right lower chest pleural-based lesion and a slight increase of a left-sided lung lesion, other lung lesions were stable  CT chest 05/02/2014 with a decrease in the size of a right lower lobe pulmonary nodule months similar size of a dominant left lower lobe nodule, minimal enlargement of a smaller left lower lobe nodule, no new site of disease  CT chest 11/07/2014 with a slight increase in several left-sided lung nodules  CT chest 02/10/2015 with new small left hilar lymph nodes, a possible new left lower lobe nodule, and stability of other lung nodules  PET scan 03/12/2015 with hypermetabolic left hilar nodes, hypermetabolic left lower lobe nodules, hyper metabolic retroperitoneal nodes, intense hypermetabolism at the coloanal anastomosis, and hypermetabolic thickening in the presacral space  Cycle 1 FOLFIRI/PANITUMUMAB 05/21/2015  Cycle 2 FOLFIRI/PANITUMUMAB 06/05/2015  Cycle 3 FOLFIRI/PANITUMUMAB 06/19/2015  Cycle 4 FOLFIRI 07/10/2015  Cycle 5 FOLFIRI 07/24/2015  Restaging CTs 08/06/2015-resolution of hilar/retroperitoneal adenopathy, improvement in the hypermetabolic lung nodule, other lung nodules are stable, no new lesions  Cycle 6 FOLFIRI with PANITUMUMAB 08/28/2015 -panitumumab dose reduced  Cycle 7 FOLFIRI 09/11/2015-no panitumumab given  Cycle 8 FOLFIRI with PANITUMUMAB 09/25/2015-PANITUMUMAB dose reduced  Cycle 9 FOLFIRI 10/09/2015-no  PANITUMUMAB given  Cycle 10 FOLFIRI with panitumumab 10/23/2015  CTs 11/06/2015-possible slight enlargement of a left lower lobe nodule, no other evidence of disease progression.  CTs 02/16/2016-enlargement of left-sided pulmonary nodules and mediastinal/left hilar nodes, improved splenomegaly  CTs 06/21/2016-increased size of mediastinal/left hilar nodes, increased left lung nodules, and increased soft tissue at the porta hepatis  CTs 10/28/2016-progressive disease in the chest and abdomen with an enlarging left hilar mass and slight interval enlargement of pulmonary lesions. Right upper quadrant necrotic nodal mass significantly increased in size with mass effect on the liver and possible invasion. Significant compression of the intrahepatic IVC. New right hepatic lobe lesion. Moderate pelvic ascites.  Cycle 1 FOLFIRI/PANITUMUMAB 11/04/2016  Cycle 2 FOLFIRI/PANITUMUMAB 11/18/2016  Cycle 3 FOLFIRI/panitumumab 12/02/2016  Cycle4 FOLFIRI/PANITUMUMAB 12/15/2016  Cycle 5 FOLFIRI/PANITUMUMAB 12/30/2016  CTs 01/06/2017-no evidence of progressive disease, decreased chest adenopathy, stable left lower lobe nodules, decreased liver metastasis, decreased right retroperitoneal mass  Cycle 6 FOLFIRI/panitumumab 01/13/2017  Cycle 7 FOLFIRI/Panitumumab 01/27/2017  Cycle 8 FOLFIRI/panitumumab 02/17/2017  Cycle 9 FOLFIRI/Panitumumab 03/03/2017  Cycle 10 FOLFIRI/Panitumumab 03/24/2017  CTs 04/06/2017-enlargement of right adrenal mass (smaller than October 2019), stable left hilar fullness and lung nodules  Cycle 11 FOLFIRI/Panitumumab 04/07/2017  Cycle 12 FOLFIRI/panitumumab 04/21/2017  Cycle 13 FOLFIRI/Panitumumab 05/05/2017  Palliative radiation to the right adrenal mass 226 2019-3 02/2018  Brain MRI 07/28/2017-4intracranial metastasis in the right frontal lobe, left parietal lobe and left occipital lobe. Surrounding edema  pronounced in the left occipital lobe.  SBRT to for brain lesions  on 08/12/2017  CTs 08/10/2017-new left upper lobe airspace process, stable lung nodules and medius and lymphadenopathy, decreased mass involving the adrenal gland with progression of a necrotic anterior portion of the mass, no liver lesions  Cycle 14 FOLFIRI/panitumumab 08/19/2017, irinotecan dose reduced secondary to thrombocytopenia, Panitumumab dose escalated  Cycle 15 FOLFIRI/panitumumab 09/01/2017) Panitumumab dose reduced secondary to an early rash following cycle 14  CT chest 10/24/2017-progression of disease in the left lung; mild progression of left hilar nodal disease with new right hilar lymphadenopathy and progressive increase in lymph nodes in the mediastinum; interval progression of caudate lobe metastatic lesion.  Cycle 1 FOLFIRI/Panitumumab 11/03/2017  Cycle 2 FOLFIRI/Panitumumab 11/17/2017  Cycle 3 FOLFIRI/panitumumab 12/08/2017 (Panitumumab dose increased)  Cycle 4 FOLFIRI/Panitumumab 12/22/2017  Cycle 5 FOLFIRI/Panitumumab 01/05/2018  CTs 01/17/2018- new 4 cm posterior liver mass. Mild growth of separate heterogeneous 5.9 cm mass posterior margin of the liver near the IVC. Newbandlike low-attenuation focus in the posterior inferior liver, indeterminate, favoring expansile right portal vein thrombus. Stable right adrenal metastasis. Mixed response in the chest. Left hilar/AP window adenopathy mildly increased. Right paratracheal adenopathy mildly increased. Right hilar adenopathy decreased. Left lower lobe pulmonary nodule stable to mildly decreased. Tiny right upper lobe pulmonary nodule slightly increased. New 2.7 cm sub-solid pulmonary nodule basilar right lower lobe. Waxing and waning left upper lobe nodular foci of consolidation. New mild splenomegaly.  Cycle 1 FOLFOX 02/02/2018  Brain MRI 02/13/2018-new left frontal parietal lesion, mild progression of enhancing lesion in the left occipital lobe-previously treated lesion  Cycle 2 FOLFOX 02/24/2018  SRS to 1  brain lesion 03/02/2018  Cycle 3 FOLFOX 03/09/2018  Cycle 4 FOLFOX 03/28/2018  MRI brain 04/18/2018- progression of disease with increased size in surrounding T2 signal changes involving lesions in the posterior left occipital lobe and medial posterior left parietal lobe. 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  CTs 04/19/2018- progression of left hilar mass with complete obstruction of the left mainstem bronchus. Complete collapse of the left upper lobe and left lower lobe with fluid filling the entire left hemithorax. Unchanged bulky mediastinal adenopathy. 2 small nodules right lung consistent with metastasis. Interval mild increase in tumor burden right hepatic lobe. Increase in upper abdominal periaortic adenopathy. New left adrenal gland metastasis. Stable right adrenal gland metastasis.  Palliative radiation to left chest 04/27/2018- 05/10/2018  Cycle 1 Lonsurf 06/05/2018  Cycle 2 Lonsurf 07/20/2018 2. History ofIrregular bowel habits/rectal bleeding secondary to #1. 3. History of Mild elevation of the liver enzymes. Question secondary to hepatic steatosis. 4. Indeterminate 8 mm posterior right liver lesion on the staging CT 03/06/2012. 5. Mildly elevated CEA at 5.7 on 03/06/2012. 6. History of radiation erythema at the groin and perineum 7. 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 No clinical evidence of thrombophlebitis or extravasation. 8. Delayed nausea following chemotherapy-Decadron prophylaxis was added with cycle 3 CAPOX-improved. 9. Ileostomy takedown 12/07/2012. 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 11. Port-A-Cath placement 12/31/204 12. Severe neutropenia secondary to chemotherapy following cycle 1 of FOLFIRIin 2015, chemotherapy was dose reduced and Jacob [name] received Neulasta with day 15 cycle 1  13. Nausea and vomiting following cycle 1 of FOLFIRI 14. Rectal stricture-manual/colonoscopic  dilatation by Dr. Ardis Hughs 03/01/2016  APR 08/17/2016-no evidence of malignancy 15. History ofLeukopenia/Thrombocytopenia-persistent, potentially a sequelae of chemotherapy or hepatic toxicity from chemotherapy/radiation. Bone marrow biopsy 11/20/2014 showed cellular bone marrow with trilineage hematopoiesis. Significant dyspoiesis was not  present and there was no evidence of metastatic carcinoma. Cytogenetic analysis showed the presence of normal male chromosomes with no observable clonal chromosomal abnormalities.  probable cirrhosis 16. Genetic testing-negative genetic panel in March 2014 17. 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 Severe over the face-steroid Dosepak prescribed 07/03/2015. Improved 07/10/2015. Further improved 07/24/2015, 08/07/2015, 08/28/2015 18. Diarrhea secondary to chemotherapy-encouraged to use Imodium  19. History of Paronychia secondary to PANITUMUMAB 20. 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 History of neutropenia, thrombocytopenia 06/23/2017. Possibly related to radiation. Jacob [name] also has a history of suspected portal hypertension secondaryto toxicity from chemotherapy 22. Seizure in the setting of brain metastases 08/05/2017, now maintained onLamictal 23. Altered mental status evaluated by neuro-oncology, Keppra discontinued, improved 24. 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 Lower body edema, multifactorial.  Trial of Lasix initiated 05/29/2018   Disposition: Jacob [name] appears unchanged.  The liver enzymes remain mildly elevated.  I reviewed the chemistry panel with the [facility] pharmacy.  It should be okay to proceed with Lonsurf since the bilirubin was normal prior to cycle 1.  Jacob [name] will begin the next cycle of Lonsurf on 07/03/2018.    I suspect the elevated liver enzymes and bilirubin  are related to intra-or extrahepatic biliary obstruction by tumor.  Jacob [name] agrees to a restaging CT abdomen for early next week.  We discussed comfort care versus proceeding with Lonsurf and diagnostic evaluation for biliary obstruction.  Jacob [name] would like to proceed with treatment and diagnostic interventions.  Jacob [name] will return for an office and lab visit in 2 weeks.  Betsy Coder, MD  06/30/2018  1:39 PM

## 2018-07-03 ENCOUNTER — Telehealth: Payer: Self-pay | Admitting: Oncology

## 2018-07-03 NOTE — Telephone Encounter (Signed)
Scheduled appt per 4/10 los.  Central radiology will contact patient about scan.

## 2018-07-07 ENCOUNTER — Telehealth: Payer: Self-pay | Admitting: *Deleted

## 2018-07-07 NOTE — Telephone Encounter (Signed)
Called to report he has not heard anything about his CT scan due before seeing Dr. Benay Spice. Scheduled scan at [address] and informed wife to date/time and to drink contrast at 1:20 and 2:20 pm and no solid food after 11:30 am. They will obtain the oral contrast on 4/22 when he has his MRI brain.  Wife reports she has noticed that Jacob [name]'s urine is getting darker and his eyes look yellow. He is also having more abdominal/back discomfort. Informed her that his liver functions had increased at last visit. Will make MD aware and get back with her on Monday.

## 2018-07-10 ENCOUNTER — Other Ambulatory Visit: Payer: Self-pay

## 2018-07-10 ENCOUNTER — Ambulatory Visit (INDEPENDENT_AMBULATORY_CARE_PROVIDER_SITE_OTHER): Payer: BC Managed Care – PPO | Admitting: Family Medicine

## 2018-07-10 ENCOUNTER — Ambulatory Visit: Payer: BC Managed Care – PPO | Admitting: Family Medicine

## 2018-07-10 ENCOUNTER — Telehealth: Payer: Self-pay | Admitting: Nurse Practitioner

## 2018-07-10 ENCOUNTER — Telehealth: Payer: Self-pay | Admitting: *Deleted

## 2018-07-10 DIAGNOSIS — R05 Cough: Secondary | ICD-10-CM | POA: Diagnosis not present

## 2018-07-10 DIAGNOSIS — K5900 Constipation, unspecified: Secondary | ICD-10-CM | POA: Diagnosis not present

## 2018-07-10 DIAGNOSIS — R945 Abnormal results of liver function studies: Secondary | ICD-10-CM

## 2018-07-10 DIAGNOSIS — R17 Unspecified jaundice: Secondary | ICD-10-CM | POA: Diagnosis not present

## 2018-07-10 DIAGNOSIS — R059 Cough, unspecified: Secondary | ICD-10-CM

## 2018-07-10 DIAGNOSIS — R112 Nausea with vomiting, unspecified: Secondary | ICD-10-CM

## 2018-07-10 DIAGNOSIS — C7931 Secondary malignant neoplasm of brain: Secondary | ICD-10-CM

## 2018-07-10 DIAGNOSIS — R7989 Other specified abnormal findings of blood chemistry: Secondary | ICD-10-CM

## 2018-07-10 DIAGNOSIS — D649 Anemia, unspecified: Secondary | ICD-10-CM

## 2018-07-10 MED ORDER — ALBUTEROL SULFATE HFA 108 (90 BASE) MCG/ACT IN AERS: 2.0000 | INHALATION_SPRAY | Freq: Four times a day (QID) | RESPIRATORY_TRACT | 1 refills | Status: AC | PRN

## 2018-07-10 MED ORDER — ONDANSETRON HCL 4 MG PO TABS: 4.0000 mg | ORAL_TABLET | Freq: Three times a day (TID) | ORAL | 2 refills | Status: DC | PRN

## 2018-07-10 NOTE — Progress Notes (Signed)
Virtual Visit via Video Note  I connected with [name] on 07/10/18 at 10:40 AM EDT by a video enabled telemedicine application and verified that I am speaking with the correct person using two identifiers.   I discussed the limitations of evaluation and management by telemedicine and the availability of in person appointments. The patient expressed understanding and agreed to proceed. Princess Eulas Post CMA was able to get patient set up with virtual platform. Patient and his wife were present for visit from their home.    Subjective:    Patient ID: Jacob [name], male    DOB: 02/27/1971, 48 y.o.   MRN: [id number]  No chief complaint on file.   HPI Patient is in today for follow up on metastatic rectal cancer, anorexia, elevated liver markers, anemia, cough. He notes he was having significant increased pedal edema bilaterally so about 5 days ago he stopped his Decadron thinking that that would help but his edema has not gotten better but many things have gotten worse, he notes he has noted yellow eyes and then yellow skin, he also notes worsening balance and fatigue, notes loss of vision and blurry vision. He notes worsening coughing and wheezing. Notes an increase in nausea and some vomiting mostly mucus. He is awaiting an appointment with oncology on 4/24 and neurology 4/27. Bowels are some slower but no bloody or tarry stool. Notes some persistent pain in his low back, stable.   Past Medical History:  Diagnosis Date  . Allergic state 01/19/2012  . Anemia   . Cancer ([place]) 03/06/12   rectal bx=Adenocarcinoma  PT HAD RADIATION , CHEMO SURGERY  . Chicken pox as a child   ?  Marland Kitchen Dysrhythmia as child   HX OF IRREGULAR HB AT TIME OF TONSILLECTOMY - SURGERY WAS NOT DONE-CAN'T REMEMBER ANY OTHER DETAILS- NEVER HAD THE SURGERY.  . Elevated liver function tests 01/19/2012  . Fatty liver   . Hernia 6 months old  . History of diarrhea    better since chemo finished  . Hx of migraines   . Lung  abnormality 2015  . Lung nodule 08/2013  . Lung nodule, multiple 11/29/2013  . Migraine headache as a child  . Numbness    TOES OF BOTH FEET.  Marland Kitchen Overweight(278.02) 01/19/2012  . Pain    LEFT HEEL PAIN -SEVERE ESPECIALLY AFTER SITTING OR LYING DOWN - DIFFICULT TO GET OUT OF BED AND WALK IN THE MORNINGS BECAUSE OF HEEL PAIN  . 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 Preventative health care 01/19/2012  . Radiation 03/20/12-04/27/12   Pelvis 50.4 gray Rectal cancer  . Rectal cancer ([place]) 03/06/12   biopsy-adenocarcinoma  . Reflux 01/19/2012  . Sun-damaged skin 01/19/2012    Past Surgical History:  Procedure Laterality Date  . CHOLECYSTECTOMY N/A 09/17/2013   Procedure: CHOLECYSTECTOMY;  Surgeon: Stark Klein, MD;  Location: [facility];  Service: General;  Laterality: N/A;  . COLOSTOMY TAKEDOWN N/A 08/17/2016   Procedure: LAPAROSCOPIC ABDOMINOPERINEAL RESECTION WITH PERMANENT COLOSTOMY;  Surgeon: Leighton Ruff, MD;  Location: [facility];  Service: General;  Laterality: N/A;  . CYST EXCISION     L EAR AREA  . EUS  03/14/2012   Procedure: LOWER ENDOSCOPIC ULTRASOUND (EUS);  Surgeon: Milus Banister, MD;  Location: [facility];  Service: Endoscopy;  Laterality: N/A;  . HERNIA REPAIR  6 months old   right inguinal repair  . ILEOSTOMY CLOSURE N/A 12/07/2012   Procedure: ILEOSTOMY  TAKEDOWN;  Surgeon: Leighton Ruff, MD;  Location: [facility];  Service: General;  Laterality: N/A;  . LAPAROSCOPIC LOW ANTERIOR RESECTION N/A 06/29/2012   Procedure: LAPAROSCOPIC LOW ANTERIOR RESECTION, mobilization splenic flexure,coloanal anastomosis,diverting ileostomy;  Surgeon: Leighton Ruff, MD;  Location: [facility];  Service: General;  Laterality: N/A;  . LAPAROSCOPY N/A 09/17/2013   Procedure: LAPAROSCOPY DIAGNOSTIC;  Surgeon: Stark Klein, MD;  Location: [facility];  Service: General;  Laterality: N/A;  . LARYNGOPLASTY Left 02/10/2018   Procedure: LARYNGOPLASTY;  Surgeon: Melissa Montane,  MD;  Location: [facility];  Service: ENT;  Laterality: Left;  . LIVER ULTRASOUND N/A 09/17/2013   Procedure: LIVER ULTRASOUND;  Surgeon: Stark Klein, MD;  Location: [facility];  Service: General;  Laterality: N/A;  . MICROLARYNGOSCOPY W/VOCAL CORD INJECTION Left 05/16/2017   Procedure: MICROLARYNGOSCOPY WITH VOCAL CORD INJECTION;  Surgeon: Melissa Montane, MD;  Location: [facility];  Service: ENT;  Laterality: Left;  . OPEN HEPATECTOMY  N/A 09/17/2013   Procedure: OPEN HEPATECTOMY;  Surgeon: Stark Klein, MD;  Location: [facility];  Service: General;  Laterality: N/A;  . PORTACATH PLACEMENT Left 03/21/2013   Procedure: INSERTION PORT-A-CATH;  Surgeon: Leighton Ruff, MD;  Location[facility];  Service: General;  Laterality: Left;  . RECTAL BIOPSY  03/06/12   distal mass1-2cm from anal verge=Adenocarcinoma  . TONSILLECTOMY      Family History  Adopted: Yes  Problem Relation Age of Onset  . Cancer Father   . Heart disease Father   . Colon cancer Neg Hx     Social History   Socioeconomic History  . Marital status: Married    Spouse name: Not on file  . Number of children: 1  . Years of education: Not on file  . Highest education level: Not on file  Occupational History  . Occupation: Midwife: [employer]    Comment: [employer]  Social Needs  . Financial resource strain: Not on file  . Food insecurity:    Worry: Not on file    Inability: Not on file  . Transportation needs:    Medical: No    Non-medical: No  Tobacco Use  . Smoking status: Never Smoker  . Smokeless tobacco: Never Used  Substance and Sexual Activity  . Alcohol use: Not Currently    Alcohol/week: 0.0 standard drinks    Comment: RARE   . Drug use: No    Comment: marijuana past  . Sexual activity: Yes    Partners: Female  Lifestyle  . Physical activity:    Days per week: Not on file    Minutes per session: Not on file  . Stress: Not on file  Relationships  . Social connections:    Talks on phone: Not  on file    Gets together: Not on file    Attends religious service: Not on file    Active member of club or organization: Not on file    Attends meetings of clubs or organizations: Not on file    Relationship status: Not on file  . Intimate partner violence:    Fear of current or ex partner: Not on file    Emotionally abused: Not on file    Physically abused: Not on file    Forced sexual activity: Not on file  Other Topics Concern  . Not on file  Social History Narrative   Married to wife, [name]   Has one 48 year old child   Occupation: Emergency planning/management officer at [employer]  Outpatient Medications Prior to Visit  Medication Sig Dispense Refill  . acetaminophen (TYLENOL) 500 MG tablet Take 500-1,000 mg by mouth daily as needed for moderate pain.    . clotrimazole (MYCELEX) 10 MG troche Take 1 lozenge (10 mg total) by mouth 4 (four) times daily. While on decadron 120 lozenge 0  . dexamethasone (DECADRON) 1 MG tablet Take 2 tablets (2 mg total) by mouth daily. 30 tablet 3  . docusate sodium (STOOL SOFTENER) 100 MG capsule Take 100 mg by mouth 2 (two) times daily as needed (constipation).     Marland Kitchen dronabinol (MARINOL) 5 MG capsule Take 1 capsule by mouth three times daily as needed 90 capsule 0  . fluticasone (FLOVENT HFA) 110 MCG/ACT inhaler Inhale 2 puffs into the lungs 2 (two) times daily. 1 Inhaler 12  . furosemide (LASIX) 20 MG tablet Take 1 tablet (20 mg total) by mouth daily. 30 tablet 2  . lamoTRIgine (LAMICTAL) 100 MG tablet Take 1 tablet (100 mg total) by mouth 2 (two) times daily. 60 tablet 2  . lidocaine-prilocaine (EMLA) cream Apply 1 application topically as needed (prior to port access). 30 g 1  . prochlorperazine (COMPAZINE) 10 MG tablet Take 1 tablet (10 mg total) by mouth every 6 (six) hours as needed for nausea or vomiting. 60 tablet 1  . sennosides-docusate sodium (SENOKOT-S) 8.6-50 MG tablet Take 1 tablet by mouth daily as needed for constipation.    . sucralfate (CARAFATE)  1 GM/10ML suspension Take 10 mLs (1 g total) by mouth 4 (four) times daily -  with meals and at bedtime. (Patient not taking: Reported on 06/30/2018) 840 mL 1  . tiZANidine (ZANAFLEX) 4 MG tablet TAKE 1/2 TO 1 (ONE-HALF TO ONE) TABLET BY MOUTH EVERY 8 HOURS AS NEEDED FOR MUSCLE SPASM 30 tablet 0  . trifluridine-tipiracil (LONSURF) 15-6.14 MG tablet Take 3 tablets (45m trifluridine) by mouth 2 times daily, within 1 hr of food on days 1-5 and 8-12 of each 28 day cycle 60 tablet 0   No facility-administered medications prior to visit.     Allergies  Allergen Reactions  . Alprazolam Other (See Comments)    Excessive sedation  . Penicillins Rash    Childhood allergy Has patient had a PCN reaction causing immediate rash, facial/tongue/throat swelling, SOB or lightheadedness with hypotension: Unknown Has patient had a PCN reaction causing severe rash involving mucus membranes or skin necrosis: Unknown Has patient had a PCN reaction that required hospitalization: No Has patient had a PCN reaction occurring within the last 10 years: No If all of the above answers are "NO", then may proceed with Cephalosporin use.     Review of Systems  Constitutional: Positive for malaise/fatigue. Negative for fever.  HENT: Negative for congestion.   Eyes: Positive for blurred vision.  Respiratory: Positive for cough, sputum production, shortness of breath and wheezing.   Cardiovascular: Negative for chest pain, palpitations and leg swelling.  Gastrointestinal: Positive for constipation, nausea and vomiting. Negative for abdominal pain and blood in stool.  Genitourinary: Negative for dysuria and frequency.  Musculoskeletal: Positive for back pain. Negative for falls.  Skin: Negative for rash.  Neurological: Positive for dizziness and headaches. Negative for loss of consciousness.  Endo/Heme/Allergies: Negative for environmental allergies.  Psychiatric/Behavioral: Negative for depression. The patient is not  nervous/anxious.        Objective:    Physical Exam Constitutional:      General: He is not in acute distress.    Appearance: Normal appearance.  HENT:     Head: Normocephalic and atraumatic.     Nose: Nose normal.  Eyes:     General: Scleral icterus present.  Pulmonary:     Effort: Pulmonary effort is normal.  Skin:    Coloration: Skin is jaundiced.  Neurological:     Mental Status: He is alert and oriented to person, place, and time.  Psychiatric:        Mood and Affect: Mood normal.        Behavior: Behavior normal.     Wt 170 lb (77.1 kg)   BMI 24.39 kg/m  Wt Readings from Last 3 Encounters:  07/10/18 170 lb (77.1 kg)  06/30/18 170 lb 9.6 oz (77.4 kg)  06/23/18 169 lb 4.8 oz (76.8 kg)    Diabetic Foot Exam - Simple   No data filed     Lab Results  Component Value Date   WBC 2.4 (L) 06/30/2018   HGB 10.8 (L) 06/30/2018   HCT 35.7 (L) 06/30/2018   PLT 96 (L) 06/30/2018   GLUCOSE 82 06/30/2018   ALT 78 (H) 06/30/2018   AST 72 (H) 06/30/2018   NA 137 06/30/2018   K 3.8 06/30/2018   CL 103 06/30/2018   CREATININE 0.84 06/30/2018   BUN 17 06/30/2018   CO2 24 06/30/2018   TSH 2.66 11/07/2017   INR 1.05 11/20/2014   HGBA1C 5.1 11/07/2017    Lab Results  Component Value Date   TSH 2.66 11/07/2017   Lab Results  Component Value Date   WBC 2.4 (L) 06/30/2018   HGB 10.8 (L) 06/30/2018   HCT 35.7 (L) 06/30/2018   MCV 93.5 06/30/2018   PLT 96 (L) 06/30/2018   Lab Results  Component Value Date   NA 137 06/30/2018   K 3.8 06/30/2018   CHLORIDE 106 03/24/2017   CO2 24 06/30/2018   GLUCOSE 82 06/30/2018   BUN 17 06/30/2018   CREATININE 0.84 06/30/2018   BILITOT 2.2 (H) 06/30/2018   ALKPHOS 294 (H) 06/30/2018   AST 72 (H) 06/30/2018   ALT 78 (H) 06/30/2018   PROT 6.4 (L) 06/30/2018   ALBUMIN 3.3 (L) 06/30/2018   CALCIUM 9.0 06/30/2018   ANIONGAP 10 06/30/2018   EGFR >60 03/24/2017   GFR 115.17 11/07/2017   No results found for: CHOL No  results found for: HDL No results found for: LDLCALC No results found for: TRIG No results found for: CHOLHDL Lab Results  Component Value Date   HGBA1C 5.1 11/07/2017       Assessment & Plan:   Problem List Items Addressed This Visit    Elevated liver function tests    Has labs scheduled with oncology next week. If he worsens will call oncology or seek care.       Anemia    Continues and is followed with oncology.      Brain metastasis ([place])    Notes some increase in poor balance and blurring vision since stopping the Decadron. Encouraged to restart steroids.      Relevant Medications   ondansetron (ZOFRAN) 4 MG tablet   Cough    Is worse with increased SOB since stopping the Decadron. Will send in Albuterol prn, continue Flovent and restart Decadron      Constipation    Is moving bowels adequately, no bloody or tarry stool but are some slower. Consider Miralax with Benefiber daily      Jaundice    He notes yellowing of his eyes and skin over  past several days since he stopped his steroids. He agrees to restart steroids today      Nausea and vomiting    Intermittent. Has some Compazine to use prn questions if it is helping. Will try Ondansetron prn         I am having Renold Genta start on albuterol and ondansetron. I am also having him maintain his docusate sodium, acetaminophen, lidocaine-prilocaine, fluticasone, sennosides-docusate sodium, clotrimazole, tiZANidine, dexamethasone, sucralfate, dronabinol, trifluridine-tipiracil, furosemide, lamoTRIgine, and prochlorperazine.  Meds ordered this encounter  Medications  . albuterol (VENTOLIN HFA) 108 (90 Base) MCG/ACT inhaler    Sig: Inhale 2 puffs into the lungs every 6 (six) hours as needed for wheezing or shortness of breath.    Dispense:  1 Inhaler    Refill:  1  . ondansetron (ZOFRAN) 4 MG tablet    Sig: Take 1 tablet (4 mg total) by mouth every 8 (eight) hours as needed for nausea or vomiting.    Dispense:   40 tablet    Refill:  2     I discussed the assessment and treatment plan with the patient. The patient was provided an opportunity to ask questions and all were answered. The patient agreed with the plan and demonstrated an understanding of the instructions.   The patient was advised to call back or seek an in-person evaluation if the symptoms worsen or if the condition fails to improve as anticipated.  I provided 25 minutes of non-face-to-face time during this encounter.   Penni Homans, MD

## 2018-07-10 NOTE — Telephone Encounter (Signed)
Rescheduled 4/24 to 4/23 per sch msg. Called and spoke with patients wife. Confirmed date and time

## 2018-07-10 NOTE — Assessment & Plan Note (Signed)
He notes yellowing of his eyes and skin over past several days since he stopped his steroids. He agrees to restart steroids today

## 2018-07-10 NOTE — Assessment & Plan Note (Signed)
Continues and is followed with oncology.

## 2018-07-10 NOTE — Telephone Encounter (Addendum)
Per Dr. Benay Spice: try to get CT scan asap and hold Lonsurf. Will get him in any day this week after CT scan completed. Left VM for patient to see if he agrees to try to move the CT scan back to [facility] for 4/21 or 4/22 and to hold Lonsurf. Email to managed care to confirm another PA is not required to change location. Wife called back and confirmed order to hold Lonsurf. They agree to move CT scan to 07/12/18 at 2:30pm and will pick up contrast at [id number] when he has his MRI. Instructed to drink oral contrast at 12:30 and 1:30 pm and NPO of solids for 4 hours prior to scan. [facility] had no opening on 07/11/18. High priority scheduling message sent to move all his appointments of 4/24 to 07/13/18.

## 2018-07-10 NOTE — Assessment & Plan Note (Addendum)
Is moving bowels adequately, no bloody or tarry stool but are some slower. Consider Miralax with Benefiber daily

## 2018-07-10 NOTE — Assessment & Plan Note (Signed)
Is worse with increased SOB since stopping the Decadron. Will send in Albuterol prn, continue Flovent and restart Decadron

## 2018-07-10 NOTE — Assessment & Plan Note (Signed)
Intermittent. Has some Compazine to use prn questions if it is helping. Will try Ondansetron prn

## 2018-07-10 NOTE — Assessment & Plan Note (Signed)
Notes some increase in poor balance and blurring vision since stopping the Decadron. Encouraged to restart steroids.

## 2018-07-10 NOTE — Assessment & Plan Note (Signed)
Has labs scheduled with oncology next week. If he worsens will call oncology or seek care.

## 2018-07-12 ENCOUNTER — Ambulatory Visit
Admission: RE | Admit: 2018-07-12 | Discharge: 2018-07-12 | Disposition: A | Discharge status: Home / Self Care | Payer: BC Managed Care – PPO | Source: Ambulatory Visit | Attending: Internal Medicine | Admitting: Internal Medicine

## 2018-07-12 ENCOUNTER — Ambulatory Visit (HOSPITAL_COMMUNITY)
Admission: RE | Admit: 2018-07-12 | Discharge: 2018-07-12 | Disposition: A | Discharge status: Home / Self Care | Payer: BC Managed Care – PPO | Source: Ambulatory Visit | Attending: Oncology | Admitting: Oncology

## 2018-07-12 ENCOUNTER — Encounter (HOSPITAL_COMMUNITY): Payer: Self-pay

## 2018-07-12 ENCOUNTER — Other Ambulatory Visit: Payer: Self-pay

## 2018-07-12 DIAGNOSIS — C787 Secondary malignant neoplasm of liver and intrahepatic bile duct: Secondary | ICD-10-CM | POA: Insufficient documentation

## 2018-07-12 DIAGNOSIS — C7931 Secondary malignant neoplasm of brain: Secondary | ICD-10-CM

## 2018-07-12 DIAGNOSIS — C20 Malignant neoplasm of rectum: Secondary | ICD-10-CM | POA: Insufficient documentation

## 2018-07-12 MED ORDER — SODIUM CHLORIDE (PF) 0.9 % IJ SOLN
INTRAMUSCULAR | Status: AC
  Filled 2018-07-12: qty 50

## 2018-07-12 MED ORDER — IOHEXOL 300 MG/ML  SOLN
100.0000 mL | Freq: Once | INTRAMUSCULAR | Status: AC | PRN
  Administered 2018-07-12: 15:00:00 100 mL via INTRAVENOUS

## 2018-07-12 MED ORDER — GADOBENATE DIMEGLUMINE 529 MG/ML IV SOLN
13.0000 mL | Freq: Once | INTRAVENOUS | Status: AC | PRN
  Administered 2018-07-12: 13 mL via INTRAVENOUS

## 2018-07-13 ENCOUNTER — Telehealth: Payer: Self-pay | Admitting: Internal Medicine

## 2018-07-13 ENCOUNTER — Other Ambulatory Visit: Payer: Self-pay

## 2018-07-13 ENCOUNTER — Telehealth: Payer: Self-pay | Admitting: Nurse Practitioner

## 2018-07-13 ENCOUNTER — Telehealth: Payer: Self-pay | Admitting: Gastroenterology

## 2018-07-13 ENCOUNTER — Inpatient Hospital Stay (HOSPITAL_BASED_OUTPATIENT_CLINIC_OR_DEPARTMENT_OTHER): Payer: BC Managed Care – PPO | Admitting: Nurse Practitioner

## 2018-07-13 ENCOUNTER — Telehealth: Payer: Self-pay | Admitting: Oncology

## 2018-07-13 ENCOUNTER — Inpatient Hospital Stay: Payer: BC Managed Care – PPO

## 2018-07-13 ENCOUNTER — Other Ambulatory Visit: Payer: Self-pay | Admitting: Radiation Therapy

## 2018-07-13 ENCOUNTER — Other Ambulatory Visit: Payer: BC Managed Care – PPO

## 2018-07-13 VITALS — BP 136/86 | HR 95 | Temp 97.9°F | Resp 19 | Ht 70.0 in | Wt 169.6 lb

## 2018-07-13 DIAGNOSIS — C7931 Secondary malignant neoplasm of brain: Secondary | ICD-10-CM | POA: Diagnosis not present

## 2018-07-13 DIAGNOSIS — C787 Secondary malignant neoplasm of liver and intrahepatic bile duct: Principal | ICD-10-CM

## 2018-07-13 DIAGNOSIS — R600 Localized edema: Secondary | ICD-10-CM

## 2018-07-13 DIAGNOSIS — C20 Malignant neoplasm of rectum: Secondary | ICD-10-CM

## 2018-07-13 DIAGNOSIS — R110 Nausea: Secondary | ICD-10-CM

## 2018-07-13 DIAGNOSIS — C7972 Secondary malignant neoplasm of left adrenal gland: Secondary | ICD-10-CM

## 2018-07-13 DIAGNOSIS — C7971 Secondary malignant neoplasm of right adrenal gland: Secondary | ICD-10-CM

## 2018-07-13 DIAGNOSIS — C7800 Secondary malignant neoplasm of unspecified lung: Secondary | ICD-10-CM

## 2018-07-13 DIAGNOSIS — C772 Secondary and unspecified malignant neoplasm of intra-abdominal lymph nodes: Secondary | ICD-10-CM

## 2018-07-13 DIAGNOSIS — K831 Obstruction of bile duct: Secondary | ICD-10-CM

## 2018-07-13 DIAGNOSIS — Z95828 Presence of other vascular implants and grafts: Secondary | ICD-10-CM

## 2018-07-13 DIAGNOSIS — R160 Hepatomegaly, not elsewhere classified: Secondary | ICD-10-CM

## 2018-07-13 LAB — CMP (CANCER CENTER ONLY)
ALT: 41 U/L (ref 0–44)
AST: 71 U/L — ABNORMAL HIGH (ref 15–41)
Albumin: 2.7 g/dL — ABNORMAL LOW (ref 3.5–5.0)
Alkaline Phosphatase: 375 U/L — ABNORMAL HIGH (ref 38–126)
Anion gap: 13 (ref 5–15)
BUN: 15 mg/dL (ref 6–20)
CO2: 21 mmol/L — ABNORMAL LOW (ref 22–32)
Calcium: 9 mg/dL (ref 8.9–10.3)
Chloride: 98 mmol/L (ref 98–111)
Creatinine: 0.79 mg/dL (ref 0.61–1.24)
GFR, Est AFR Am: >60 mL/min (ref >60)
GFR, Estimated: >60 mL/min (ref >60)
Glucose, Bld: 80 mg/dL (ref 70–99)
Potassium: 3.6 mmol/L (ref 3.5–5.1)
Sodium: 132 mmol/L — ABNORMAL LOW (ref 135–145)
Total Bilirubin: 17.8 mg/dL (ref 0.3–1.2)
Total Protein: 5.8 g/dL — ABNORMAL LOW (ref 6.5–8.1)

## 2018-07-13 LAB — CBC WITH DIFFERENTIAL (CANCER CENTER ONLY)
Abs Immature Granulocytes: 0.05 10*3/uL (ref 0.00–0.07)
Basophils Absolute: 0 10*3/uL (ref 0.0–0.1)
Basophils Relative: 0 %
Eosinophils Absolute: 0 10*3/uL (ref 0.0–0.5)
Eosinophils Relative: 0 %
HCT: 31.7 % — ABNORMAL LOW (ref 39.0–52.0)
Hemoglobin: 10.8 g/dL — ABNORMAL LOW (ref 13.0–17.0)
Immature Granulocytes: 1 %
Lymphocytes Relative: 6 %
Lymphs Abs: 0.3 10*3/uL — ABNORMAL LOW (ref 0.7–4.0)
MCH: 29.1 pg (ref 26.0–34.0)
MCHC: 34.1 g/dL (ref 30.0–36.0)
MCV: 85.4 fL (ref 80.0–100.0)
Monocytes Absolute: 0.5 10*3/uL (ref 0.1–1.0)
Monocytes Relative: 10 %
Neutro Abs: 4.1 10*3/uL (ref 1.7–7.7)
Neutrophils Relative %: 83 %
Platelet Count: 111 10*3/uL — ABNORMAL LOW (ref 150–400)
RBC: 3.71 MIL/uL — ABNORMAL LOW (ref 4.22–5.81)
RDW: 25.7 % — ABNORMAL HIGH (ref 11.5–15.5)
WBC Count: 5 10*3/uL (ref 4.0–10.5)
nRBC: 0.4 % — ABNORMAL HIGH (ref 0.0–0.2)

## 2018-07-13 MED ORDER — SODIUM CHLORIDE 0.9% FLUSH
10.0000 mL | INTRAVENOUS | Status: DC | PRN
  Administered 2018-07-13: 10 mL
  Filled 2018-07-13: qty 10

## 2018-07-13 MED ORDER — HEPARIN SOD (PORK) LOCK FLUSH 100 UNIT/ML IV SOLN
500.0000 [IU] | Freq: Once | INTRAVENOUS | Status: AC | PRN
  Administered 2018-07-13: 500 [IU] via INTRAVENOUS
  Filled 2018-07-13: qty 5

## 2018-07-13 NOTE — Telephone Encounter (Signed)
I spoke with Dr. Benay Spice today.  Jacob [name] has metastatic rectal cancer (liver, abd, lungs) with new and enlarging tumors in liver, mainly centrally. Bili up from around 2 in early April to 18 today.    I reviewed his CT and spoke with Dr. Rush Landmark who also reviewed his CT. Pretty minimal intrahepatic duct dilation (seems R>L) and we both have some concern about potential more diffuse liver disease that is just not visible on CT.    We are planning ERCP with Dr. Rush Landmark (next week, likely Wedensday AM) to stent, decompress.  If his LFTs fail to improve after successful stent placement then that would indicate more advanced liver disease.  Wilnette Kales  thanks  Sheri, Thanks for your help putting this on.  Gabe, thanks as always

## 2018-07-13 NOTE — Progress Notes (Addendum)
[place] OFFICE PROGRESS NOTE   Diagnosis: Rectal cancer  INTERVAL HISTORY:   Mr. Pellow returns as scheduled.  He has developed jaundice.  He notes his urine is dark.  Continues to have bilateral leg edema.  He is moving "slow".  Appetite diminished for the past few weeks.  Some constipation.  He vomits periodically.  He tends to have nausea when he wakes up in the morning.  Objective:  Vital signs in last 24 hours:  Blood pressure 136/86, pulse 95, temperature 97.9 F (36.6 C), temperature source Oral, resp. rate 19, height '5\' 10"'  (1.778 m), weight 169 lb 9.6 oz (76.9 kg), SpO2 98 %.    HEENT: Scleral icterus. GI: Abdomen is distended.  Left lower quadrant colostomy. Vascular: Pitting edema below the knees bilaterally.  Skin: Jaundiced. Port-A-Cath without erythema.  Lab Results:  Lab Results  Component Value Date   WBC 5.0 07/13/2018   HGB 10.8 (L) 07/13/2018   HCT 31.7 (L) 07/13/2018   MCV 85.4 07/13/2018   PLT 111 (L) 07/13/2018   NEUTROABS 4.1 07/13/2018    Imaging:  Mr Jeri Cos RX Contrast  Result Date: 07/12/2018 CLINICAL DATA:  Restaging of metastatic colorectal cancer. Brain metastases treated with radiation therapy in 2019. EXAM: MRI HEAD WITHOUT AND WITH CONTRAST TECHNIQUE: Multiplanar, multiecho pulse sequences of the brain and surrounding structures were obtained without and with intravenous contrast. CONTRAST:  82m MULTIHANCE GADOBENATE DIMEGLUMINE 529 MG/ML IV SOLN COMPARISON:  04/18/2018 FINDINGS: BRAIN New Lesions: 1. 8 mm enhancing lesion with mild edema in the right middle cerebellar peduncle (series 11, image 37). Larger lesions: 1. 5.2 x 2.6 cm irregularly enhancing left occipital lesion with slight worsening of extensive surrounding edema (series 11, image 70 and series 13, image 25, previously 4.7 x 2.2 cm when measured in a similar fashion). 2. 15 x 9 mm medial left parietal lesion (series 11, image 103, previously 12 x 10 mm). 3. 8  mm ring-enhancing lesion in the posterior left frontal lobe with new mild edema (series 11, image 113, previously 3 mm). 4. Minimally increased size of 3 mm right frontal lesion without edema (series 11, image 108, previously 2 mm). Stable or Smaller lesions: None. Other Brain findings: There are chronic blood products associated with the left occipital, left parietal, and left frontal lobe lesions. Mass effect associated with vasogenic edema in the posterior left cerebrum is unchanged with partial effacement of the left lateral ventricle. No acute infarct, midline shift, or extra-axial fluid collection is evident. Vascular: Major intracranial vascular flow voids are preserved. Skull and upper cervical spine: No suspicious marrow lesion. Sinuses/Orbits: Unremarkable orbits. Paranasal sinuses and mastoid air cells are clear. Other: None. IMPRESSION: 1. New 8 mm metastasis in the right middle cerebellar peduncle. 2. Mildly increased size of all other lesions, including the 5 cm left occipital lesion with slight worsening of extensive edema. Electronically Signed   By: ALogan BoresM.D.   On: 07/12/2018 11:45   Ct Abdomen Pelvis W Contrast  Result Date: 07/13/2018 CLINICAL DATA:  Metastatic rectal cancer diagnosed in 2013. Chemotherapy and radiation therapy. Brain metastasis. Partial liver resection. Colostomy. Evaluate for biliary obstruction. EXAM: CT ABDOMEN AND PELVIS WITH CONTRAST TECHNIQUE: Multidetector CT imaging of the abdomen and pelvis was performed using the standard protocol following bolus administration of intravenous contrast. CONTRAST:  1052mOMNIPAQUE IOHEXOL 300 MG/ML  SOLN COMPARISON:  04/19/2018 FINDINGS: Lower chest: Tiny bilateral pleural effusions. The left effusion is decreased since the prior CT. The  right effusion is new. Bilateral pulmonary metastasis. In index right lower lobe 6 mm nodule on image 9/6 is similar to on the prior exam (when remeasured). An index medial right lower lobe 9  mm nodule on image 44/6 is increased from 6 mm on the prior exam (when remeasured). Improved left base aeration, with resolution of previous basilar collapse. Hepatobiliary: Tumor centered about the posterior right liver and involving the intrahepatic IVC again identified. Dominant medial/caudate lobe mass measures 6.5 x 4.4 cm on image 23/2. Compare 5.5 x 3.9 cm on the prior. An adjacent more peripheral/lateral lesion measures 3.3 x 2.8 cm on image 23/2 versus 2.2 x 2.0 cm on the prior. A right hepatic subcapsular lesion is new at 2.4 x 2.2 cm on image 33/2. Cholecystectomy. Development of mild intrahepatic biliary duct dilatation, including image 31/2. This is followed to the level of the central tumor described above. Example area of biliary obstruction on image 27/2. No common duct dilatation. Pancreas: Normal, without mass or ductal dilatation. Spleen: Normal in size, without focal abnormality. 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 Interpolar left renal 1.5 cm lesion on image 16/7 has enlarged from 9 mm on the prior. Normal right kidney, without hydronephrosis. Contrast within the urinary bladder. Stomach/Bowel: Normal stomach, without wall thickening. Status post abdominal perineal resection with descending colostomy. Normal terminal ileum. Normal small bowel caliber. Vascular/Lymphatic: Aortic and branch vessel atherosclerosis. 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 This is chronic. The portal vein and branches are increasingly narrowed by tumor without acute thrombus. Left periaortic adenopathy at 1.9 cm on image 33/2, similar. A preaortic node measures 1.6 cm on image 38/2 versus 1.3 cm on the prior. No pelvic sidewall  adenopathy. Reproductive: Normal prostate. Other: Presacral soft tissue thickening is likely treatment related. Small volume abdominal ascites is new. Musculoskeletal: Bilateral femoral head avascular necrosis. Degenerate disc disease at the lumbosacral junction. Left hemidiaphragm elevation. IMPRESSION: 1. Mild to moderate disease progression compared to 04/19/2018. 2. New and enlarged hepatic metastasis, with involvement of the intrahepatic IVC and increased narrowing of the portal vein/branches. 3. Developing mild intrahepatic biliary duct dilatation, likely secondary to mass effect from central tumor. 4. Progressive abdominal nodal metastasis. Similar right adrenal metastasis. 5. Slight progression of lung base metastasis. Improved left-sided aeration with decreased left and new right pleural effusion. 6. No bowel obstruction or other acute complication. 7. New small volume abdominal ascites. 8. Bilateral femoral head avascular necrosis. 9. Enlarging left renal lesion, suspicious for metastasis. Electronically Signed   By: Abigail Miyamoto M.D.   On: 07/13/2018 10:53    Medications: I have reviewed the patient's current medications.  Assessment/Plan: 1. Rectal cancer. Partially obstructing mass noted 1-2 cm from the anal verge on a colonoscopy 03/06/2012. Endoscopic ultrasound 03/14/2012 with a 7.5 mm thick, 3.2 cm wide hypoechoic, irregularly bordered mass that clearly passed into and through the muscularis propria layer of the distal rectal wall (uT3); 3 small (largest 7 mm) perirectal lymph nodes. The lymph nodes were all round, discrete, hypoechoic, homogenous; suspicious for malignant involvement (uN1).  No RAS mutation identified by [facility] 1 testing on the colon resection specimen 06/29/2012, APC alteration identified, microsatellite stable  He began radiation and concurrent Xeloda chemotherapy on 03/20/2012, completed 04/27/2012.   Low anterior  resection/coloanal anastomosis and diverting  ileostomy 06/29/2012 with the final pathology revealing a T2N0 tumor with extensive fibrosis and negative margins.   Cycle 1 of adjuvant CAPOX 07/19/2012. Cycle 5 of adjuvant CAPOX 10/11/2012.   CEA 2.5 on 11/14/2012.   CEA 14.6 03/08/2013.   Restaging CT evaluation 03/08/2013 with a 4 mm pulmonary nodule left lung base not identified on comparison exam; new liver lesions including a 29 x 26 mm irregular peripheral enhancing rounded lesion in the dome of the right hepatic lobe, a less well-defined new subcapsular lesion in the lateral right hepatic lobe measuring 12 mm, a new subcapsular lesion in the anterior right hepatic lobe adjacent to the gallbladder fossa measuring 10 mm; a rounded low-density lesion in the inferior right hepatic lobe measuring 10 mm compared to 7 mm on the prior study (radiologist commented this may represent an enlarged cyst).   MRI of the abdomen 04/12/2013 confirmed multiple T2 hyperintense metastatic lesions throughout the right liver   Initiation of FOLFIRI/Avastin with genotype based irinotecan dosing per the [facility] study 04/05/2013.   Restaging CT evaluation on 06/20/2013 (after 2 cycles/4 treatments) showed improvement in the liver metastases and stable size of a left lower lobe pulmonary nodule now with central cavitation.   Continuation of FOLFIRI/Avastin.   Restaging CT evaluation 08/14/2013-decrease in the size of liver metastases, slight decrease in the size of a cavitary left lower lobe nodule, no evidence of disease progression.   Status post right hepatic lobectomy 09/17/2013. Pathology showed multiple foci of metastatic adenocarcinoma (5 nodules of metastatic adenocarcinoma 4 of which are subcapsular with the nodules ranging in size from 0.6-1.8 cm in greatest dimension). Margins not involved. Biopsy of a portal lymph node showed benign adipose tissue; no lymph node tissue or malignancy.   Normal CEA 11/06/2013   CT 11/06/2013 with a new  pleural-based right lower chest lesion, slight in enlargement of the a left-sided lung nodule  CT 01/29/2014 with a decrease in the right lower chest pleural-based lesion and a slight increase of a left-sided lung lesion, other lung lesions were stable  CT chest 05/02/2014 with a decrease in the size of a right lower lobe pulmonary nodule months similar size of a dominant left lower lobe nodule, minimal enlargement of a smaller left lower lobe nodule, no new site of disease  CT chest 11/07/2014 with a slight increase in several left-sided lung nodules  CT chest 02/10/2015 with new small left hilar lymph nodes, a possible new left lower lobe nodule, and stability of other lung nodules  PET scan 03/12/2015 with hypermetabolic left hilar nodes, hypermetabolic left lower lobe nodules, hyper metabolic retroperitoneal nodes, intense hypermetabolism at the coloanal anastomosis, and hypermetabolic thickening in the presacral space  Cycle 1 FOLFIRI/PANITUMUMAB 05/21/2015  Cycle 2 FOLFIRI/PANITUMUMAB 06/05/2015  Cycle 3 FOLFIRI/PANITUMUMAB 06/19/2015  Cycle 4 FOLFIRI 07/10/2015  Cycle 5 FOLFIRI 07/24/2015  Restaging CTs 08/06/2015-resolution of hilar/retroperitoneal adenopathy, improvement in the hypermetabolic lung nodule, other lung nodules are stable, no new lesions  Cycle 6 FOLFIRI with PANITUMUMAB 08/28/2015 -panitumumab dose reduced  Cycle 7 FOLFIRI 09/11/2015-no panitumumab given  Cycle 8 FOLFIRI with PANITUMUMAB 09/25/2015-PANITUMUMAB dose reduced  Cycle 9 FOLFIRI 10/09/2015-no PANITUMUMAB given  Cycle 10 FOLFIRI with panitumumab 10/23/2015  CTs 11/06/2015-possible slight enlargement of a left lower lobe nodule, no other evidence of disease progression.  CTs 02/16/2016-enlargement of left-sided pulmonary nodules and mediastinal/left hilar nodes, improved splenomegaly  CTs 06/21/2016-increased size of mediastinal/left hilar nodes, increased left lung nodules, and increased soft  tissue at  the porta hepatis  CTs 10/28/2016-progressive disease in the chest and abdomen with an enlarging left hilar mass and slight interval enlargement of pulmonary lesions. Right upper quadrant necrotic nodal mass significantly increased in size with mass effect on the liver and possible invasion. Significant compression of the intrahepatic IVC. New right hepatic lobe lesion. Moderate pelvic ascites.  Cycle 1 FOLFIRI/PANITUMUMAB 11/04/2016  Cycle 2 FOLFIRI/PANITUMUMAB 11/18/2016  Cycle 3 FOLFIRI/panitumumab 12/02/2016  Cycle4 FOLFIRI/PANITUMUMAB 12/15/2016  Cycle 5 FOLFIRI/PANITUMUMAB 12/30/2016  CTs 01/06/2017-no evidence of progressive disease, decreased chest adenopathy, stable left lower lobe nodules, decreased liver metastasis, decreased right retroperitoneal mass  Cycle 6 FOLFIRI/panitumumab 01/13/2017  Cycle 7 FOLFIRI/Panitumumab 01/27/2017  Cycle 8 FOLFIRI/panitumumab 02/17/2017  Cycle 9 FOLFIRI/Panitumumab 03/03/2017  Cycle 10 FOLFIRI/Panitumumab 03/24/2017  CTs 04/06/2017-enlargement of right adrenal mass (smaller than October 2019), stable left hilar fullness and lung nodules  Cycle 11 FOLFIRI/Panitumumab 04/07/2017  Cycle 12 FOLFIRI/panitumumab 04/21/2017  Cycle 13 FOLFIRI/Panitumumab 05/05/2017  Palliative radiation to the right adrenal mass 226 2019-3 02/2018  Brain MRI 07/28/2017-4intracranial metastasis in the right frontal lobe, left parietal lobe and left occipital lobe. Surrounding edema pronounced in the left occipital lobe.  SBRT to for brain lesions on 08/12/2017  CTs 08/10/2017-new left upper lobe airspace process, stable lung nodules and medius and lymphadenopathy, decreased mass involving the adrenal gland with progression of a necrotic anterior portion of the mass, no liver lesions  Cycle 14 FOLFIRI/panitumumab 08/19/2017, irinotecan dose reduced secondary to thrombocytopenia, Panitumumab dose escalated  Cycle 15 FOLFIRI/panitumumab 09/01/2017)  Panitumumab dose reduced secondary to an early rash following cycle 14  CT chest 10/24/2017-progression of disease in the left lung; mild progression of left hilar nodal disease with new right hilar lymphadenopathy and progressive increase in lymph nodes in the mediastinum; interval progression of caudate lobe metastatic lesion.  Cycle 1 FOLFIRI/Panitumumab 11/03/2017  Cycle 2 FOLFIRI/Panitumumab 11/17/2017  Cycle 3 FOLFIRI/panitumumab 12/08/2017 (Panitumumab dose increased)  Cycle 4 FOLFIRI/Panitumumab 12/22/2017  Cycle 5 FOLFIRI/Panitumumab 01/05/2018  CTs 01/17/2018- new 4 cm posterior liver mass. Mild growth of separate heterogeneous 5.9 cm mass posterior margin of the liver near the IVC. Newbandlike low-attenuation focus in the posterior inferior liver, indeterminate, favoring expansile right portal vein thrombus. Stable right adrenal metastasis. Mixed response in the chest. Left hilar/AP window adenopathy mildly increased. Right paratracheal adenopathy mildly increased. Right hilar adenopathy decreased. Left lower lobe pulmonary nodule stable to mildly decreased. Tiny right upper lobe pulmonary nodule slightly increased. New 2.7 cm sub-solid pulmonary nodule basilar right lower lobe. Waxing and waning left upper lobe nodular foci of consolidation. New mild splenomegaly.  Cycle 1 FOLFOX 02/02/2018  Brain MRI 02/13/2018-new left frontal parietal lesion, mild progression of enhancing lesion in the left occipital lobe-previously treated lesion  Cycle 2 FOLFOX 02/24/2018  SRS to 1 brain lesion 03/02/2018  Cycle 3 FOLFOX 03/09/2018  Cycle 4 FOLFOX 03/28/2018  MRI brain 04/18/2018-progression of disease with increased size in surrounding T2 signal changes involving lesions in the posterior left occipital lobe and medial posterior left parietal lobe. 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  CTs 04/19/2018-progression of left hilar mass with complete obstruction of the left mainstem  bronchus. Complete collapse of the left upper lobe and left lower lobe with fluid filling the entire left hemithorax. Unchanged bulky mediastinal adenopathy. 2 small nodules right lung consistent with metastasis. Interval mild increase in tumor burden right hepatic lobe. Increase in upper abdominal periaortic adenopathy. New left adrenal gland metastasis. Stable right adrenal gland metastasis.  Palliative radiation to left chest 04/27/2018-05/10/2018  Cycle 1 Lonsurf 06/05/2018  Cycle 2 Lonsurf 07/03/2018, discontinued 07/10/2018  CT abdomen/pelvis 07/12/2018- new and enlarged hepatic metastasis with involvement of the intrahepatic IVC and increased narrowing of the portal vein/branches.  Developing mild intrahepatic biliary duct dilatation likely secondary to mass-effect from central tumor.  Progressive abdominal nodal metastasis.  Similar right adrenal metastasis.  Slight progression of lung base metastasis.  New right pleural effusion.  New small volume abdominal ascites.  Enlarging left renal lesion suspicious for metastasis. 2. History ofIrregular bowel habits/rectal bleeding secondary to #1. 3. History of Mild elevation of the liver enzymes. Question secondary to hepatic steatosis. 4. Indeterminate 8 mm posterior right liver lesion on the staging CT 03/06/2012. 5. Mildly elevated CEA at 5.7 on 03/06/2012. 6. History of radiation erythema at the groin and perineum 7. 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 No clinical evidence of thrombophlebitis or extravasation. 8. Delayed nausea following chemotherapy-Decadron prophylaxis was added with cycle 3 CAPOX-improved. 9. Ileostomy takedown 12/07/2012. 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 11. Port-A-Cath placement 12/31/204 12. Severe neutropenia secondary to chemotherapy following cycle 1 of FOLFIRIin 2015, chemotherapy was dose reduced and he received  Neulasta with day 15 cycle 1  13. Nausea and vomiting following cycle 1 of FOLFIRI 14. Rectal stricture-manual/colonoscopic dilatation by Dr. Ardis Hughs 03/01/2016  APR 08/17/2016-no evidence of malignancy 15. History ofLeukopenia/Thrombocytopenia-persistent, potentially a sequelae of chemotherapy or hepatic toxicity from chemotherapy/radiation. Bone marrow biopsy 11/20/2014 showed cellular bone marrow with trilineage hematopoiesis. Significant dyspoiesis was not present and there was no evidence of metastatic carcinoma. Cytogenetic analysis showed the presence of normal male chromosomes with no observable clonal chromosomal abnormalities.  probable cirrhosis 16. Genetic testing-negative genetic panel in March 2014 17. 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 Severe over the face-steroid Dosepak prescribed 07/03/2015. Improved 07/10/2015. Further improved 07/24/2015, 08/07/2015, 08/28/2015 18. Diarrhea secondary to chemotherapy-encouraged to use Imodium  19. History of Paronychia secondary to PANITUMUMAB 20. 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 History of neutropenia, thrombocytopenia 06/23/2017. Possibly related to radiation. He also has a history of suspected portal hypertension secondaryto toxicity from chemotherapy 22. Seizure in the setting of brain metastases 08/05/2017, now maintained onLamictal 23. Altered mental status evaluated by neuro-oncology, Keppra discontinued, improved 24. 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 Lower body edema, multifactorial. Trial of Lasix initiated 05/29/2018   Disposition: Mr. Housman has metastatic rectal cancer.  He has completed 1 cycle of Lonsurf.  Cycle 2 was prematurely discontinued due to liver enzyme elevation.  CT scans done yesterday show evidence of disease progression in the  liver, lungs, abdominal lymph nodes.  Mild intrahepatic biliary duct dilatation noted.  Labs from today show marked hyperbilirubinemia.  Dr. Benay Spice reviewed the lab results and CT report/images with Mr. Sinning at today's visit.  He understands there is evidence of disease progression.  He further understands he needs to continue to hold the Lonsurf.  Dr. Benay Spice has spoken with Dr. Ardis Hughs regarding bile duct obstruction and the possibility of stent placement.  Dr. Ardis Hughs will review the CT scan.  Mr. Dematteo would like to proceed with stent placement if this is felt to be an option.  He understands to contact the office with fever, chills, other signs of infection.  He will return for lab and follow-up on 07/19/2018.  He will contact the office in the interim as outlined  above or with any other problems.  His wife was on the phone during today's visit.  Patient seen with Dr. Benay Spice.  25 minutes were spent face-to-face at today's visit with the majority of that time involved in counseling/coordination of care.     Ned Card ANP/GNP-BC   07/13/2018  2:55 PM  This was a shared visit with Ned Card.  Mr. Rafferty has developed progressive jaundice.  The CT reveals disease progression compared to a CT from January.  There is intrahepatic biliary dilatation.  I suspect the biliary obstruction is related to tumor in the central liver.  The lower extremity edema may be related to IVC involvement by tumor.  We reviewed the CT images and discussed treatment options with Mr. Streater and his wife (present by telephone).  We discussed supportive/comfort care versus an attempt at decompressing the biliary system.  He would like to proceed with an intervention in an attempt to relieve the jaundice.  I discussed the case with Dr. Ardis Hughs.  Gastroenterology will contact Mr. Rosemond to schedule an attempt at bile duct stenting.  Julieanne Manson, MD

## 2018-07-13 NOTE — Telephone Encounter (Signed)
Spoke with patient re 5/1 appointments. Rescheduled from 4/29 per 4/23 schedule message.

## 2018-07-13 NOTE — Telephone Encounter (Signed)
thanks

## 2018-07-13 NOTE — Telephone Encounter (Signed)
ERCP scheduled with stent placement on 07/19/18 [facility].  His wife is advised of the appt date and time and he is asked to arrive at 8:30 and be NPO after midnight.

## 2018-07-13 NOTE — Telephone Encounter (Signed)
Scheduled appt per 4/23 los. °

## 2018-07-14 ENCOUNTER — Other Ambulatory Visit: Payer: BC Managed Care – PPO

## 2018-07-14 ENCOUNTER — Ambulatory Visit: Payer: BC Managed Care – PPO | Admitting: Nurse Practitioner

## 2018-07-17 ENCOUNTER — Other Ambulatory Visit: Payer: Self-pay

## 2018-07-17 ENCOUNTER — Inpatient Hospital Stay (HOSPITAL_BASED_OUTPATIENT_CLINIC_OR_DEPARTMENT_OTHER): Payer: BC Managed Care – PPO | Admitting: Internal Medicine

## 2018-07-17 ENCOUNTER — Telehealth: Payer: Self-pay | Admitting: Internal Medicine

## 2018-07-17 ENCOUNTER — Encounter: Payer: Self-pay | Admitting: Internal Medicine

## 2018-07-17 VITALS — BP 131/96 | HR 108 | Temp 98.5°F | Resp 20 | Ht 70.0 in | Wt 169.2 lb

## 2018-07-17 DIAGNOSIS — C7931 Secondary malignant neoplasm of brain: Secondary | ICD-10-CM

## 2018-07-17 DIAGNOSIS — G9340 Encephalopathy, unspecified: Secondary | ICD-10-CM | POA: Diagnosis not present

## 2018-07-17 DIAGNOSIS — K831 Obstruction of bile duct: Secondary | ICD-10-CM

## 2018-07-17 DIAGNOSIS — R600 Localized edema: Secondary | ICD-10-CM

## 2018-07-17 DIAGNOSIS — C20 Malignant neoplasm of rectum: Secondary | ICD-10-CM | POA: Diagnosis not present

## 2018-07-17 DIAGNOSIS — R569 Unspecified convulsions: Secondary | ICD-10-CM

## 2018-07-17 DIAGNOSIS — H547 Unspecified visual loss: Secondary | ICD-10-CM

## 2018-07-17 NOTE — Telephone Encounter (Signed)
No los per 4/27.

## 2018-07-17 NOTE — Progress Notes (Signed)
[facility] [place], [place] [address] [phone]   Interval Evaluation  Date of Service: 07/17/18 Patient Name: Jacob [name] Patient MRN: [id number] Patient DOB: 12-17-1970 Provider: Ventura Sellers, MD  Identifying Statement:  Jacob [name] is a 48 y.o. 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[place])    03/20/2012 - 04/27/2012 Radiation Therapy    RT for 28 Fractions w / Xeloda     04/30/2015 - 01/18/2018 Chemotherapy    The patient had palonosetron (ALOXI) injection 0.25 mg, 0.25 mg, Intravenous,  Once, 30 of 32 cycles Administration: 0.25 mg (05/21/2015), 0.25 mg (06/05/2015), 0.25 mg (06/19/2015), 0.25 mg (07/10/2015), 0.25 mg (07/24/2015), 0.25 mg (08/28/2015), 0.25 mg (09/11/2015), 0.25 mg (09/25/2015), 0.25 mg (10/09/2015), 0.25 mg (10/23/2015), 0.25 mg (11/04/2016), 0.25 mg (11/18/2016), 0.25 mg (12/02/2016), 0.25 mg (12/15/2016), 0.25 mg (12/30/2016), 0.25 mg (01/13/2017), 0.25 mg (01/27/2017), 0.25 mg (02/17/2017), 0.25 mg (03/03/2017), 0.25 mg (03/24/2017), 0.25 mg (04/07/2017), 0.25 mg (04/21/2017), 0.25 mg (05/05/2017), 0.25 mg (08/19/2017), 0.25 mg (09/01/2017), 0.25 mg (11/03/2017), 0.25 mg (11/17/2017), 0.25 mg (12/08/2017), 0.25 mg (12/22/2017), 0.25 mg (01/05/2018) pegfilgrastim (NEULASTA) injection 6 mg, 6 mg, Subcutaneous, Once, 23 of 23 cycles Administration: 6 mg (05/23/2015), 6 mg (06/07/2015), 6 mg (06/21/2015), 6 mg (07/12/2015), 6 mg (07/26/2015), 6 mg (08/30/2015), 6 mg (09/13/2015), 6 mg (09/27/2015), 6 mg (10/11/2015), 6 mg (10/25/2015), 6 mg (11/06/2016), 6 mg (11/20/2016), 6 mg (12/04/2016), 6 mg (12/17/2016), 6 mg (01/01/2017), 6 mg (01/15/2017), 6 mg (01/29/2017), 6 mg (02/19/2017), 6 mg (03/05/2017), 6 mg (03/26/2017), 6 mg (04/09/2017), 6 mg (04/23/2017), 6 mg (05/07/2017) pegfilgrastim-cbqv  (UDENYCA) injection 6 mg, 6 mg, Subcutaneous, Once, 6 of 8 cycles Administration: 6 mg (08/22/2017), 6 mg (11/05/2017), 6 mg (11/19/2017), 6 mg (12/10/2017), 6 mg (12/24/2017), 6 mg (01/07/2018) irinotecan (CAMPTOSAR) 258 mg in dextrose 5 % 500 mL chemo infusion, 135 mg/m2 = 258 mg (75 % of original dose 180 mg/m2), Intravenous,  Once, 30 of 32 cycles Dose modification: 135 mg/m2 (original dose 180 mg/m2, Cycle 1, Reason: Provider Judgment), 100 mg/m2 (original dose 180 mg/m2, Cycle 24, Reason: Provider Judgment) Administration: 258 mg (05/21/2015), 260 mg (06/05/2015), 258 mg (06/19/2015), 258 mg (07/10/2015), 260 mg (07/24/2015), 260 mg (08/28/2015), 260 mg (09/11/2015), 260 mg (09/25/2015), 260 mg (10/23/2015), 260 mg (11/04/2016), 240 mg (11/18/2016), 240 mg (12/02/2016), 240 mg (12/15/2016), 240 mg (12/30/2016), 240 mg (01/13/2017), 240 mg (01/27/2017), 240 mg (02/17/2017), 240 mg (03/03/2017), 240 mg (03/24/2017), 240 mg (04/07/2017), 240 mg (04/21/2017), 240 mg (05/05/2017), 180 mg (08/19/2017), 180 mg (09/01/2017), 180 mg (11/03/2017), 180 mg (11/17/2017), 180 mg (12/08/2017), 180 mg (12/22/2017), 180 mg (01/05/2018) leucovorin 764 mg in dextrose 5 % 250 mL infusion, 400 mg/m2 = 764 mg, Intravenous,  Once, 30 of 32 cycles Administration: 764 mg (05/21/2015), 764 mg (06/05/2015), 764 mg (06/19/2015), 764 mg (07/10/2015), 760 mg (07/24/2015), 760 mg (08/28/2015), 760 mg (09/11/2015), 760 mg (09/25/2015), 764 mg (10/09/2015), 764 mg (10/23/2015), 764 mg (11/04/2016), 764 mg (11/18/2016), 764 mg (12/02/2016), 764 mg (12/15/2016), 716 mg (12/30/2016), 716 mg (01/13/2017), 716 mg (01/27/2017), 716 mg (02/17/2017), 716 mg (03/03/2017), 716 mg (03/24/2017), 716 mg (04/07/2017), 716 mg (04/21/2017), 716 mg (05/05/2017), 736 mg (08/19/2017), 736 mg (09/01/2017), 736 mg (11/03/2017), 736 mg (11/17/2017), 736 mg (12/08/2017), 736 mg (12/22/2017), 736 mg (01/05/2018) fluorouracil (  ADRUCIL) chemo injection 750 mg, 400 mg/m2 = 750 mg, Intravenous,  Once, 10 of 10  cycles Administration: 750 mg (05/21/2015), 750 mg (06/05/2015), 750 mg (06/19/2015), 750 mg (07/10/2015), 750 mg (07/24/2015), 750 mg (08/28/2015), 750 mg (09/11/2015), 750 mg (09/25/2015), 750 mg (10/09/2015), 750 mg (10/23/2015) leucovorin injection 38 mg, 20 mg/m2 = 38 mg, Intravenous,  Once, 1 of 1 cycle fosaprepitant (EMEND) 150 mg, dexamethasone (DECADRON) 12 mg in sodium chloride 0.9 % 145 mL IVPB, , Intravenous,  Once, 30 of 32 cycles Administration:  (05/21/2015),  (06/05/2015),  (06/19/2015),  (07/10/2015),  (07/24/2015),  (08/28/2015),  (09/11/2015),  (09/25/2015),  (10/09/2015),  (10/23/2015),  (11/04/2016),  (11/18/2016),  (12/02/2016),  (12/15/2016),  (12/30/2016),  (01/13/2017),  (01/27/2017),  (02/17/2017),  (03/03/2017),  (03/24/2017),  (04/07/2017),  (04/21/2017),  (05/05/2017),  (08/19/2017),  (09/01/2017),  (11/03/2017),  (11/17/2017),  (12/08/2017),  (12/22/2017),  (01/05/2018) fluorouracil (ADRUCIL) 4,600 mg in sodium chloride 0.9 % 58 mL chemo infusion, 2,400 mg/m2 = 4,600 mg, Intravenous, 1 Day/Dose, 30 of 32 cycles Administration: 4,600 mg (05/21/2015), 4,600 mg (06/05/2015), 4,600 mg (06/19/2015), 4,600 mg (07/10/2015), 4,600 mg (07/24/2015), 4,600 mg (08/28/2015), 4,600 mg (09/11/2015), 4,600 mg (09/25/2015), 4,600 mg (10/09/2015), 4,600 mg (10/23/2015), 4,600 mg (11/04/2016), 4,600 mg (11/18/2016), 4,600 mg (12/02/2016), 4,600 mg (12/15/2016), 4,300 mg (12/30/2016), 4,300 mg (01/13/2017), 4,300 mg (01/27/2017), 4,300 mg (02/17/2017), 4,300 mg (03/03/2017), 4,300 mg (03/24/2017), 4,300 mg (04/07/2017), 4,300 mg (04/21/2017), 4,300 mg (05/05/2017), 4,300 mg (08/19/2017), 4,400 mg (09/01/2017), 4,400 mg (11/03/2017), 4,400 mg (11/17/2017), 4,400 mg (12/08/2017), 4,400 mg (12/22/2017), 4,400 mg (01/05/2018)  for chemotherapy treatment.     02/02/2018 -  Chemotherapy    The patient had palonosetron (ALOXI) injection 0.25 mg, 0.25 mg, Intravenous,  Once, 4 of 6 cycles Administration: 0.25 mg (02/02/2018), 0.25 mg (02/24/2018), 0.25 mg (03/09/2018), 0.25 mg  (03/28/2018) pegfilgrastim-cbqv (UDENYCA) injection 6 mg, 6 mg, Subcutaneous, Once, 3 of 5 cycles Administration: 6 mg (03/11/2018), 6 mg (03/30/2018) leucovorin 728 mg in dextrose 5 % 250 mL infusion, 400 mg/m2 = 728 mg, Intravenous,  Once, 4 of 6 cycles Administration: 728 mg (02/02/2018), 728 mg (02/24/2018), 728 mg (03/09/2018), 728 mg (03/28/2018) oxaliplatin (ELOXATIN) 150 mg in dextrose 5 % 500 mL chemo infusion, 82 mg/m2 = 155 mg, Intravenous,  Once, 4 of 6 cycles Dose modification: 60 mg/m2 (original dose 85 mg/m2, Cycle 2, Reason: Provider Judgment) Administration: 150 mg (02/02/2018), 110 mg (02/24/2018), 110 mg (03/09/2018), 110 mg (03/28/2018) fluorouracil (ADRUCIL) 4,350 mg in sodium chloride 0.9 % 63 mL chemo infusion, 2,400 mg/m2 = 4,350 mg, Intravenous, 1 Day/Dose, 4 of 6 cycles Administration: 4,350 mg (02/02/2018), 4,350 mg (02/24/2018), 4,350 mg (03/09/2018), 4,350 mg (03/28/2018)  for chemotherapy treatment.      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[place])    04/30/2015 - 01/18/2018 Chemotherapy    The patient had palonosetron (ALOXI) injection 0.25 mg, 0.25 mg, Intravenous,  Once, 30 of 32 cycles Administration: 0.25 mg (05/21/2015), 0.25 mg (06/05/2015), 0.25 mg (06/19/2015), 0.25 mg (07/10/2015), 0.25 mg (07/24/2015), 0.25 mg (08/28/2015), 0.25 mg (09/11/2015), 0.25 mg (09/25/2015), 0.25 mg (10/09/2015), 0.25 mg (10/23/2015), 0.25 mg (11/04/2016), 0.25 mg (11/18/2016), 0.25 mg (12/02/2016), 0.25 mg (12/15/2016), 0.25 mg (12/30/2016), 0.25 mg (01/13/2017), 0.25 mg (01/27/2017), 0.25 mg (02/17/2017), 0.25 mg (03/03/2017), 0.25 mg (03/24/2017), 0.25 mg (04/07/2017), 0.25 mg (04/21/2017), 0.25 mg (05/05/2017), 0.25 mg (08/19/2017), 0.25 mg (09/01/2017), 0.25 mg (11/03/2017), 0.25 mg (11/17/2017), 0.25 mg (12/08/2017), 0.25 mg (12/22/2017), 0.25  mg (01/05/2018) pegfilgrastim (NEULASTA) injection 6 mg, 6 mg, Subcutaneous, Once, 23 of 23  cycles Administration: 6 mg (05/23/2015), 6 mg (06/07/2015), 6 mg (06/21/2015), 6 mg (07/12/2015), 6 mg (07/26/2015), 6 mg (08/30/2015), 6 mg (09/13/2015), 6 mg (09/27/2015), 6 mg (10/11/2015), 6 mg (10/25/2015), 6 mg (11/06/2016), 6 mg (11/20/2016), 6 mg (12/04/2016), 6 mg (12/17/2016), 6 mg (01/01/2017), 6 mg (01/15/2017), 6 mg (01/29/2017), 6 mg (02/19/2017), 6 mg (03/05/2017), 6 mg (03/26/2017), 6 mg (04/09/2017), 6 mg (04/23/2017), 6 mg (05/07/2017) pegfilgrastim-cbqv (UDENYCA) injection 6 mg, 6 mg, Subcutaneous, Once, 6 of 8 cycles Administration: 6 mg (08/22/2017), 6 mg (11/05/2017), 6 mg (11/19/2017), 6 mg (12/10/2017), 6 mg (12/24/2017), 6 mg (01/07/2018) irinotecan (CAMPTOSAR) 258 mg in dextrose 5 % 500 mL chemo infusion, 135 mg/m2 = 258 mg (75 % of original dose 180 mg/m2), Intravenous,  Once, 30 of 32 cycles Dose modification: 135 mg/m2 (original dose 180 mg/m2, Cycle 1, Reason: Provider Judgment), 100 mg/m2 (original dose 180 mg/m2, Cycle 24, Reason: Provider Judgment) Administration: 258 mg (05/21/2015), 260 mg (06/05/2015), 258 mg (06/19/2015), 258 mg (07/10/2015), 260 mg (07/24/2015), 260 mg (08/28/2015), 260 mg (09/11/2015), 260 mg (09/25/2015), 260 mg (10/23/2015), 260 mg (11/04/2016), 240 mg (11/18/2016), 240 mg (12/02/2016), 240 mg (12/15/2016), 240 mg (12/30/2016), 240 mg (01/13/2017), 240 mg (01/27/2017), 240 mg (02/17/2017), 240 mg (03/03/2017), 240 mg (03/24/2017), 240 mg (04/07/2017), 240 mg (04/21/2017), 240 mg (05/05/2017), 180 mg (08/19/2017), 180 mg (09/01/2017), 180 mg (11/03/2017), 180 mg (11/17/2017), 180 mg (12/08/2017), 180 mg (12/22/2017), 180 mg (01/05/2018) leucovorin 764 mg in dextrose 5 % 250 mL infusion, 400 mg/m2 = 764 mg, Intravenous,  Once, 30 of 32 cycles Administration: 764 mg (05/21/2015), 764 mg (06/05/2015), 764 mg (06/19/2015), 764 mg (07/10/2015), 760 mg (07/24/2015), 760 mg (08/28/2015), 760 mg (09/11/2015), 760 mg (09/25/2015), 764 mg (10/09/2015), 764 mg (10/23/2015), 764 mg (11/04/2016), 764 mg (11/18/2016), 764 mg (12/02/2016), 764  mg (12/15/2016), 716 mg (12/30/2016), 716 mg (01/13/2017), 716 mg (01/27/2017), 716 mg (02/17/2017), 716 mg (03/03/2017), 716 mg (03/24/2017), 716 mg (04/07/2017), 716 mg (04/21/2017), 716 mg (05/05/2017), 736 mg (08/19/2017), 736 mg (09/01/2017), 736 mg (11/03/2017), 736 mg (11/17/2017), 736 mg (12/08/2017), 736 mg (12/22/2017), 736 mg (01/05/2018) fluorouracil (ADRUCIL) chemo injection 750 mg, 400 mg/m2 = 750 mg, Intravenous,  Once, 10 of 10 cycles Administration: 750 mg (05/21/2015), 750 mg (06/05/2015), 750 mg (06/19/2015), 750 mg (07/10/2015), 750 mg (07/24/2015), 750 mg (08/28/2015), 750 mg (09/11/2015), 750 mg (09/25/2015), 750 mg (10/09/2015), 750 mg (10/23/2015) leucovorin injection 38 mg, 20 mg/m2 = 38 mg, Intravenous,  Once, 1 of 1 cycle fosaprepitant (EMEND) 150 mg, dexamethasone (DECADRON) 12 mg in sodium chloride 0.9 % 145 mL IVPB, , Intravenous,  Once, 30 of 32 cycles Administration:  (05/21/2015),  (06/05/2015),  (06/19/2015),  (07/10/2015),  (07/24/2015),  (08/28/2015),  (09/11/2015),  (09/25/2015),  (10/09/2015),  (10/23/2015),  (11/04/2016),  (11/18/2016),  (12/02/2016),  (12/15/2016),  (12/30/2016),  (01/13/2017),  (01/27/2017),  (02/17/2017),  (03/03/2017),  (03/24/2017),  (04/07/2017),  (04/21/2017),  (05/05/2017),  (08/19/2017),  (09/01/2017),  (11/03/2017),  (11/17/2017),  (12/08/2017),  (12/22/2017),  (01/05/2018) fluorouracil (ADRUCIL) 4,600 mg in sodium chloride 0.9 % 58 mL chemo infusion, 2,400 mg/m2 = 4,600 mg, Intravenous, 1 Day/Dose, 30 of 32 cycles Administration: 4,600 mg (05/21/2015), 4,600 mg (06/05/2015), 4,600 mg (06/19/2015), 4,600 mg (07/10/2015), 4,600 mg (07/24/2015), 4,600 mg (08/28/2015), 4,600 mg (09/11/2015), 4,600 mg (09/25/2015), 4,600 mg (10/09/2015), 4,600 mg (10/23/2015), 4,600 mg (11/04/2016), 4,600 mg (11/18/2016), 4,600 mg (12/02/2016), 4,600 mg (12/15/2016), 4,300 mg (12/30/2016), 4,300 mg (01/13/2017), 4,300  mg (01/27/2017), 4,300 mg (02/17/2017), 4,300 mg (03/03/2017), 4,300 mg (03/24/2017), 4,300 mg (04/07/2017), 4,300 mg  (04/21/2017), 4,300 mg (05/05/2017), 4,300 mg (08/19/2017), 4,400 mg (09/01/2017), 4,400 mg (11/03/2017), 4,400 mg (11/17/2017), 4,400 mg (12/08/2017), 4,400 mg (12/22/2017), 4,400 mg (01/05/2018)  for chemotherapy treatment.     02/02/2018 -  Chemotherapy    The patient had palonosetron (ALOXI) injection 0.25 mg, 0.25 mg, Intravenous,  Once, 4 of 6 cycles Administration: 0.25 mg (02/02/2018), 0.25 mg (02/24/2018), 0.25 mg (03/09/2018), 0.25 mg (03/28/2018) pegfilgrastim-cbqv (UDENYCA) injection 6 mg, 6 mg, Subcutaneous, Once, 3 of 5 cycles Administration: 6 mg (03/11/2018), 6 mg (03/30/2018) leucovorin 728 mg in dextrose 5 % 250 mL infusion, 400 mg/m2 = 728 mg, Intravenous,  Once, 4 of 6 cycles Administration: 728 mg (02/02/2018), 728 mg (02/24/2018), 728 mg (03/09/2018), 728 mg (03/28/2018) oxaliplatin (ELOXATIN) 150 mg in dextrose 5 % 500 mL chemo infusion, 82 mg/m2 = 155 mg, Intravenous,  Once, 4 of 6 cycles Dose modification: 60 mg/m2 (original dose 85 mg/m2, Cycle 2, Reason: Provider Judgment) Administration: 150 mg (02/02/2018), 110 mg (02/24/2018), 110 mg (03/09/2018), 110 mg (03/28/2018) fluorouracil (ADRUCIL) 4,350 mg in sodium chloride 0.9 % 63 mL chemo infusion, 2,400 mg/m2 = 4,350 mg, Intravenous, 1 Day/Dose, 4 of 6 cycles Administration: 4,350 mg (02/02/2018), 4,350 mg (02/24/2018), 4,350 mg (03/09/2018), 4,350 mg (03/28/2018)  for chemotherapy treatment.      Interim History:  Edmund Holcomb presents following MRI brain.  Unfortunately he has developed biliary obstruction and hyperbilirubinemia with jaundice, and is scheduled for biliary stent placement later this week.  He continues to describe impaired vision in the "right side" but this has not worsened in the past 2 months.  He does describe increased "mental fog" and "shakiness in hands" since onset of the jaundice.  He underwent once cycle of lonsurf for progressive CRC prior to this most recent setback.  Swelling in his lower legs has increased  significantly.  Medications: Current Outpatient Medications on File Prior to Visit  Medication Sig Dispense Refill   acetaminophen (TYLENOL) 500 MG tablet Take 500-1,000 mg by mouth every 6 (six) hours as needed for moderate pain.      albuterol (VENTOLIN HFA) 108 (90 Base) MCG/ACT inhaler Inhale 2 puffs into the lungs every 6 (six) hours as needed for wheezing or shortness of breath. 1 Inhaler 1   clotrimazole (MYCELEX) 10 MG troche Take 1 lozenge (10 mg total) by mouth 4 (four) times daily. While on decadron (Patient taking differently: Take 10 mg by mouth 4 (four) times daily as needed (oral thrush). ) 120 lozenge 0   dexamethasone (DECADRON) 1 MG tablet Take 2 tablets (2 mg total) by mouth daily. (Patient not taking: Reported on 07/14/2018) 30 tablet 3   dexamethasone (DECADRON) 2 MG tablet Take 1 mg by mouth daily.     docusate sodium (STOOL SOFTENER) 100 MG capsule Take 100-200 mg by mouth 5 (five) times daily as needed (constipation).      dronabinol (MARINOL) 5 MG capsule Take 1 capsule by mouth three times daily as needed (Patient taking differently: Take 5 mg by mouth 3 (three) times daily as needed (nausea/appetite stimulant). ) 90 capsule 0   fluticasone (FLOVENT HFA) 110 MCG/ACT inhaler Inhale 2 puffs into the lungs 2 (two) times daily. 1 Inhaler 12   furosemide (LASIX) 20 MG tablet Take 1 tablet (20 mg total) by mouth daily. 30 tablet 2   lamoTRIgine (LAMICTAL) 100 MG tablet Take 1 tablet (100 mg total) by  mouth 2 (two) times daily. 60 tablet 2   lidocaine-prilocaine (EMLA) cream Apply 1 application topically as needed (prior to port access). 30 g 1   ondansetron (ZOFRAN) 4 MG tablet Take 1 tablet (4 mg total) by mouth every 8 (eight) hours as needed for nausea or vomiting. 40 tablet 2   prochlorperazine (COMPAZINE) 10 MG tablet Take 1 tablet (10 mg total) by mouth every 6 (six) hours as needed for nausea or vomiting. 60 tablet 1   sennosides-docusate sodium (SENOKOT-S)  8.6-50 MG tablet Take 1 tablet by mouth 2 (two) times daily as needed for constipation ((scheduled at bedtime)).      tiZANidine (ZANAFLEX) 4 MG tablet TAKE 1/2 TO 1 (ONE-HALF TO ONE) TABLET BY MOUTH EVERY 8 HOURS AS NEEDED FOR MUSCLE SPASM (Patient taking differently: Take 2-4 mg by mouth every 8 (eight) hours as needed (muscle pain.). ) 30 tablet 0   trifluridine-tipiracil (LONSURF) 15-6.14 MG tablet Take 3 tablets (45mg  trifluridine) by mouth 2 times daily, within 1 hr of food on days 1-5 and 8-12 of each 28 day cycle (Patient taking differently: Take 45 mg of trifluridine by mouth See admin instructions. Take 3 tablets (45mg  trifluridine) by mouth 2 times daily, within 1 hr of food on days 1-5 and 8-12 of each 28 day cycle) 60 tablet 0   No current facility-administered medications on file prior to visit.     Allergies:  Allergies  Allergen Reactions   Alprazolam Other (See Comments)    Excessive sedation   Penicillins Rash    Childhood allergy Has patient had a PCN reaction causing immediate rash, facial/tongue/throat swelling, SOB or lightheadedness with hypotension: Unknown Has patient had a PCN reaction causing severe rash involving mucus membranes or skin necrosis: Unknown Has patient had a PCN reaction that required hospitalization: No Has patient had a PCN reaction occurring within the last 10 years: No If all of the above answers are "NO", then may proceed with Cephalosporin use.    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   Dysrhythmia as child   HX OF IRREGULAR HB AT TIME OF TONSILLECTOMY - SURGERY WAS NOT DONE-CAN'T REMEMBER ANY OTHER DETAILS- NEVER HAD THE SURGERY.   Elevated liver function tests 01/19/2012   Fatty liver    Hernia 6 months old   History of diarrhea    better since  chemo finished   Hx of migraines    Lung abnormality 2015   Lung nodule 08/2013   Lung nodule, multiple 11/29/2013   Migraine headache as a child   Numbness    TOES OF BOTH FEET.   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   mostly in feet   Preventative health care 01/19/2012   Radiation 03/20/12-04/27/12   Pelvis 50.4 gray Rectal cancer   Rectal cancer ([place]) 03/06/12   biopsy-adenocarcinoma   Reflux 01/19/2012   Sun-damaged skin 01/19/2012   Past Surgical History:  Past Surgical History:  Procedure Laterality Date   CHOLECYSTECTOMY N/A 09/17/2013   Procedure: CHOLECYSTECTOMY;  Surgeon: Stark Klein, MD;  Location:  [facility];  Service: General;  Laterality: N/A;   COLOSTOMY TAKEDOWN N/A 08/17/2016   Procedure: LAPAROSCOPIC ABDOMINOPERINEAL RESECTION WITH PERMANENT COLOSTOMY;  Surgeon: [facility] Ruff, MD;  Location: [facility];  Service: General;  Laterality: N/A;   CYST EXCISION     L EAR AREA   EUS  03/14/2012   Procedure: LOWER ENDOSCOPIC ULTRASOUND (EUS);  Surgeon: Milus Banister, MD;  Location: [facility];  Service: Endoscopy;  Laterality: N/A;   HERNIA REPAIR  6 months old   right inguinal repair   ILEOSTOMY CLOSURE N/A 12/07/2012   Procedure: ILEOSTOMY TAKEDOWN;  Surgeon: [facility] Ruff, MD;  Location: [facility];  Service: General;  Laterality: N/A;   LAPAROSCOPIC LOW ANTERIOR RESECTION N/A 06/29/2012   Procedure: LAPAROSCOPIC LOW ANTERIOR RESECTION, mobilization splenic flexure,coloanal anastomosis,diverting ileostomy;  Surgeon: [facility] Ruff, MD;  Location: [facility];  Service: General;  Laterality: N/A;   LAPAROSCOPY N/A 09/17/2013   Procedure: LAPAROSCOPY DIAGNOSTIC;  Surgeon: Stark Klein, MD;  Location: [facility];  Service: General;  Laterality: N/A;   LARYNGOPLASTY  Left 02/10/2018   Procedure: LARYNGOPLASTY;  Surgeon: Melissa Montane, MD;  Location: [facility];  Service: ENT;  Laterality: Left;   LIVER ULTRASOUND N/A 09/17/2013   Procedure: LIVER ULTRASOUND;  Surgeon: Stark Klein, MD;  Location: [facility];  Service: General;  Laterality: N/A;   MICROLARYNGOSCOPY W/VOCAL CORD INJECTION Left 05/16/2017   Procedure: MICROLARYNGOSCOPY WITH VOCAL CORD INJECTION;  Surgeon: Melissa Montane, MD;  Location: [facility];  Service: ENT;  Laterality: Left;   OPEN HEPATECTOMY  N/A 09/17/2013   Procedure: OPEN HEPATECTOMY;  Surgeon: Stark Klein, MD;  Location: [facility];  Service: General;  Laterality: N/A;   PORTACATH PLACEMENT Left 03/21/2013   Procedure: INSERTION PORT-A-CATH;  Surgeon: [facility] Ruff, MD;  Location: [facility];  Service: General;  Laterality: Left;   RECTAL BIOPSY  03/06/12   distal mass1-2cm from anal verge=Adenocarcinoma   TONSILLECTOMY     Social History:  Social History   Socioeconomic History   Marital status: Married    Spouse name: Not on file   Number of children: 1   Years of education: Not on file   Highest education level: Not on file  Occupational History   Occupation: Midwife: [employer]    Comment: [employer]  Social Needs   Emergency planning/management officer strain: Not on file   Food insecurity:    Worry: Not on file    Inability: Not on file   Transportation needs:    Medical: No    Non-medical: No  Tobacco Use   Smoking status: Never Smoker   Smokeless tobacco: Never Used  Substance and Sexual Activity   Alcohol use: Not Currently    Alcohol/week: 0.0 standard drinks    Comment: RARE    Drug use: No    Comment: marijuana past   Sexual activity: Yes    Partners: Female  Lifestyle   Physical activity:    Days per week: Not on file    Minutes per session: Not on file   Stress: Not on file  Relationships   Social connections:    Talks on phone: Not on file    Gets together: Not on file    Attends  religious service: Not on file    Active member of club or organization: Not on file    Attends meetings of clubs or organizations: Not on file    Relationship status: Not on file   Intimate partner violence:  Fear of current or ex partner: Not on file    Emotionally abused: Not on file    Physically abused: Not on file    Forced sexual activity: Not on file  Other Topics Concern   Not on file  Social History Narrative   Married to wife, [name]   Has one 58 year old child   Occupation: Emergency planning/management officer at [employer]   Family History:  Family History  Adopted: Yes  Problem Relation Age of Onset   Cancer Father    Heart disease Father    Colon cancer Neg Hx     Review of Systems: Constitutional: Denies fevers, chills or abnormal weight loss Eyes: Denies blurriness of vision Ears, nose, mouth, throat, and face: Denies mucositis or sore throat Respiratory: Denies cough, dyspnea or wheezes Cardiovascular: Denies palpitation, chest discomfort or lower extremity swelling Gastrointestinal:  Colostomy GU: Denies dysuria or incontinence Skin: +leg rash Neurological: +headaches Musculoskeletal: +joint pain, No decrease in ROM Behavioral/Psych: mood instability   Physical Exam: Vitals:   07/17/18 0950  BP: (!) 131/96  Pulse: (!) 108  Resp: 20  Temp: 98.5 F (36.9 C)  SpO2: 97%   KPS: 90. General: Alert, cooperative, pleasant, jaundiced appearing Head: Normal EENT: No conjunctival injection or scleral icterus. Oral mucosa moist Lungs: Resp effort normal Cardiac: Regular rate and rhythm Abdomen: Colostomy C/D/I Skin: No rashes cyanosis or petechiae. Extremities: 3+ pitting edema b/l LE  Neurologic Exam: Mental Status: Awake, alert, attentive to examiner. Language is fluent with intact comprehension.  Cranial Nerves: Visual acuity is grossly normal. Right non-dense homonymous hemianopia. Extra-ocular movements intact. No ptosis. Face is symmetric, tongue  midline. Motor: Tone and bulk are normal. Power is full in both arms and legs, mild asterixis noted. Reflexes are symmetric, no pathologic reflexes present. Intact finger to nose bilaterally Sensory: Intact to light touch and temperature Gait: Normal and tandem gait deferred  Labs: I have reviewed the data as listed    Component Value Date/Time   NA 132 (L) 07/13/2018 1318   NA 142 03/24/2017 1009   K 3.6 07/13/2018 1318   K 4.0 03/24/2017 1009   CL 98 07/13/2018 1318   CL 108 (H) 08/30/2012 0903   CO2 21 (L) 07/13/2018 1318   CO2 27 03/24/2017 1009   GLUCOSE 80 07/13/2018 1318   GLUCOSE 96 03/24/2017 1009   GLUCOSE 96 08/30/2012 0903   BUN 15 07/13/2018 1318   BUN 12.2 03/24/2017 1009   CREATININE 0.79 07/13/2018 1318   CREATININE 0.7 03/24/2017 1009   CALCIUM 9.0 07/13/2018 1318   CALCIUM 8.8 03/24/2017 1009   PROT 5.8 (L) 07/13/2018 1318   PROT 6.1 (L) 03/24/2017 1009   ALBUMIN 2.7 (L) 07/13/2018 1318   ALBUMIN 3.6 03/24/2017 1009   AST 71 (H) 07/13/2018 1318   AST 19 03/24/2017 1009   ALT 41 07/13/2018 1318   ALT 14 03/24/2017 1009   ALKPHOS 375 (H) 07/13/2018 1318   ALKPHOS 67 03/24/2017 1009   BILITOT 17.8 (HH) 07/13/2018 1318   BILITOT <0.22 03/24/2017 1009   GFRNONAA >60 07/13/2018 1318   GFRAA >60 07/13/2018 1318   Lab Results  Component Value Date   WBC 5.0 07/13/2018   NEUTROABS 4.1 07/13/2018   HGB 10.8 (L) 07/13/2018   HCT 31.7 (L) 07/13/2018   MCV 85.4 07/13/2018   PLT 111 (L) 07/13/2018   Imaging:  [place] Clinician Interpretation: I have personally reviewed the CNS images as listed.  My interpretation, in the context  of the patient's clinical presentation, is progressive disease  Mr Jeri Cos Wo Contrast  Result Date: 07/12/2018 CLINICAL DATA:  Restaging of metastatic colorectal cancer. Brain metastases treated with radiation therapy in 2019. EXAM: MRI HEAD WITHOUT AND WITH CONTRAST TECHNIQUE: Multiplanar, multiecho pulse sequences of the brain and  surrounding structures were obtained without and with intravenous contrast. CONTRAST:  63mL MULTIHANCE GADOBENATE DIMEGLUMINE 529 MG/ML IV SOLN COMPARISON:  04/18/2018 FINDINGS: BRAIN New Lesions: 1. 8 mm enhancing lesion with mild edema in the right middle cerebellar peduncle (series 11, image 37). Larger lesions: 1. 5.2 x 2.6 cm irregularly enhancing left occipital lesion with slight worsening of extensive surrounding edema (series 11, image 70 and series 13, image 25, previously 4.7 x 2.2 cm when measured in a similar fashion). 2. 15 x 9 mm medial left parietal lesion (series 11, image 103, previously 12 x 10 mm). 3. 8 mm ring-enhancing lesion in the posterior left frontal lobe with new mild edema (series 11, image 113, previously 3 mm). 4. Minimally increased size of 3 mm right frontal lesion without edema (series 11, image 108, previously 2 mm). Stable or Smaller lesions: None. Other Brain findings: There are chronic blood products associated with the left occipital, left parietal, and left frontal lobe lesions. Mass effect associated with vasogenic edema in the posterior left cerebrum is unchanged with partial effacement of the left lateral ventricle. No acute infarct, midline shift, or extra-axial fluid collection is evident. Vascular: Major intracranial vascular flow voids are preserved. Skull and upper cervical spine: No suspicious marrow lesion. Sinuses/Orbits: Unremarkable orbits. Paranasal sinuses and mastoid air cells are clear. Other: None. IMPRESSION: 1. New 8 mm metastasis in the right middle cerebellar peduncle. 2. Mildly increased size of all other lesions, including the 5 cm left occipital lesion with slight worsening of extensive edema. Electronically Signed   By: Logan Bores M.D.   On: 07/12/2018 11:45   Ct Abdomen Pelvis W Contrast  Result Date: 07/13/2018 CLINICAL DATA:  Metastatic rectal cancer diagnosed in 2013. Chemotherapy and radiation therapy. Brain metastasis. Partial liver  resection. Colostomy. Evaluate for biliary obstruction. EXAM: CT ABDOMEN AND PELVIS WITH CONTRAST TECHNIQUE: Multidetector CT imaging of the abdomen and pelvis was performed using the standard protocol following bolus administration of intravenous contrast. CONTRAST:  152mL OMNIPAQUE IOHEXOL 300 MG/ML  SOLN COMPARISON:  04/19/2018 FINDINGS: Lower chest: Tiny bilateral pleural effusions. The left effusion is decreased since the prior CT. The right effusion is new. Bilateral pulmonary metastasis. In index right lower lobe 6 mm nodule on image 9/6 is similar to on the prior exam (when remeasured). An index medial right lower lobe 9 mm nodule on image 44/6 is increased from 6 mm on the prior exam (when remeasured). Improved left base aeration, with resolution of previous basilar collapse. Hepatobiliary: Tumor centered about the posterior right liver and involving the intrahepatic IVC again identified. Dominant medial/caudate lobe mass measures 6.5 x 4.4 cm on image 23/2. Compare 5.5 x 3.9 cm on the prior. An adjacent more peripheral/lateral lesion measures 3.3 x 2.8 cm on image 23/2 versus 2.2 x 2.0 cm on the prior. A right hepatic subcapsular lesion is new at 2.4 x 2.2 cm on image 33/2. Cholecystectomy. Development of mild intrahepatic biliary duct dilatation, including image 31/2. This is followed to the level of the central tumor described above. Example area of biliary obstruction on image 27/2. No common duct dilatation. Pancreas: Normal, without mass or ductal dilatation. Spleen: Normal in size, without focal abnormality. Adrenals/Urinary  Tract: Normal left adrenal gland (the previously described left adrenal lesion is actually attributed to retroperitoneal adenopathy.) Right adrenal metastasis measures 2.6 cm on image 25/2 versus 2.5 cm on the prior exam (when remeasured). Interpolar left renal 1.5 cm lesion on image 16/7 has enlarged from 9 mm on the prior. Normal right kidney, without hydronephrosis. Contrast  within the urinary bladder. Stomach/Bowel: Normal stomach, without wall thickening. Status post abdominal perineal resection with descending colostomy. Normal terminal ileum. Normal small bowel caliber. Vascular/Lymphatic: Aortic and branch vessel atherosclerosis. 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 This is chronic. The portal vein and branches are increasingly narrowed by tumor without acute thrombus. Left periaortic adenopathy at 1.9 cm on image 33/2, similar. A preaortic node measures 1.6 cm on image 38/2 versus 1.3 cm on the prior. No pelvic sidewall adenopathy. Reproductive: Normal prostate. Other: Presacral soft tissue thickening is likely treatment related. Small volume abdominal ascites is new. Musculoskeletal: Bilateral femoral head avascular necrosis. Degenerate disc disease at the lumbosacral junction. Left hemidiaphragm elevation. IMPRESSION: 1. Mild to moderate disease progression compared to 04/19/2018. 2. New and enlarged hepatic metastasis, with involvement of the intrahepatic IVC and increased narrowing of the portal vein/branches. 3. Developing mild intrahepatic biliary duct dilatation, likely secondary to mass effect from central tumor. 4. Progressive abdominal nodal metastasis. Similar right adrenal metastasis. 5. Slight progression of lung base metastasis. Improved left-sided aeration with decreased left and new right pleural effusion. 6. No bowel obstruction or other acute complication. 7. New small volume abdominal ascites. 8. Bilateral femoral head avascular necrosis. 9. Enlarging left renal lesion, suspicious for metastasis. Electronically Signed   By: Abigail Miyamoto M.D.   On: 07/13/2018 10:53    Assessment/Plan 1. Brain metastasis ([place])  2. Focal seizures [facility])  Mr. Atienza has clinical changes today that are consistent with mild encephalopathy from hepatobiliary dysfunction.  His MRI demonstrates a new metastasis at  the right CP angle, but his is not yet focally symptomatic.  Several other treated lesions are mildly larger/inflamed, but notably the bulky left occipital lesion is relatively stable after a period of rapid growth.  We recommended radiosurgery for the new lesion, which can be therapeutic as well as palliative.  He is likely to develop right sided weakness and incoordination without intervention.  Other treated foci can be monitored radiographically for now.  We recommended continuing dexamethasone 1mg  daily for now. He should continue Lamictal to 100mg  BID for seizures.  He should return to clinic in 3 months following SRS or as needed.  We appreciate the opportunity to participate in the care of Kane Kusek.   All questions were answered. The patient knows to call the clinic with any problems, questions or concerns. No barriers to learning were detected.  The total time spent in the encounter was 25 minutes and more than 50% was on counseling and review of test results   Ventura Sellers, MD Medical Director of Neuro-Oncology [facility] at [place] 07/17/18 9:45 AM

## 2018-07-18 ENCOUNTER — Ambulatory Visit
Admission: RE | Admit: 2018-07-18 | Discharge: 2018-07-18 | Disposition: A | Discharge status: Home / Self Care | Payer: BC Managed Care – PPO | Source: Ambulatory Visit | Attending: Radiation Oncology | Admitting: Radiation Oncology

## 2018-07-18 ENCOUNTER — Telehealth: Payer: Self-pay | Admitting: Gastroenterology

## 2018-07-18 ENCOUNTER — Encounter: Payer: Self-pay | Admitting: Radiation Oncology

## 2018-07-18 ENCOUNTER — Encounter (HOSPITAL_COMMUNITY): Payer: Self-pay | Admitting: *Deleted

## 2018-07-18 ENCOUNTER — Other Ambulatory Visit: Payer: Self-pay

## 2018-07-18 VITALS — BP 114/72 | HR 98 | Temp 98.8°F | Resp 18 | Ht 70.0 in | Wt 166.6 lb

## 2018-07-18 DIAGNOSIS — C7931 Secondary malignant neoplasm of brain: Secondary | ICD-10-CM | POA: Insufficient documentation

## 2018-07-18 DIAGNOSIS — Z510 Encounter for antineoplastic radiation therapy: Secondary | ICD-10-CM | POA: Diagnosis not present

## 2018-07-18 MED ORDER — SODIUM CHLORIDE 0.9% FLUSH
10.0000 mL | Freq: Once | INTRAVENOUS | Status: AC
  Administered 2018-07-18: 10 mL via INTRAVENOUS

## 2018-07-18 MED ORDER — HEPARIN SOD (PORK) LOCK FLUSH 100 UNIT/ML IV SOLN
500.0000 [IU] | Freq: Once | INTRAVENOUS | Status: AC
  Administered 2018-07-18: 16:00:00 500 [IU] via INTRAVENOUS

## 2018-07-18 NOTE — Progress Notes (Signed)
The patient has been following with Dr. 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 He had a recent MRI on 07/12/2018 that revealed a new 8 kmm right cerebellar lesion and his other previously treated lesions were stable to slightly inflamed. He was counseled on the options by Dr. Mickeal Skinner for additional treatment. He was considered for [facility] by Dr. Lisbeth Renshaw and this has been recommended. I met with the patient during simulation today to  Proceed with a single fraction treatment next week. We reviewed risks, benefits, short, and long term effects and he wishes to proceed. Consent was signed and a copy provided to the patient. He will continue to be followed in our brain oncology program.    Carola Rhine, [facility]

## 2018-07-18 NOTE — Anesthesia Preprocedure Evaluation (Addendum)
Anesthesia Evaluation  Patient identified by MRN, date of birth, ID band Patient awake    Reviewed: Allergy & Precautions, NPO status , Patient's Chart, lab work & pertinent test results  Airway Mallampati: II  TM Distance: >3 FB Neck ROM: Full    Dental no notable dental hx.    Pulmonary neg pulmonary ROS,    Pulmonary exam normal breath sounds clear to auscultation       Cardiovascular negative cardio ROS Normal cardiovascular exam Rhythm:Regular Rate:Normal     Neuro/Psych  Headaches, Seizures -,  negative psych ROS   GI/Hepatic negative GI ROS, Neg liver ROS,   Endo/Other  negative endocrine ROS  Renal/GU negative Renal ROS  negative genitourinary   Musculoskeletal negative musculoskeletal ROS (+)   Abdominal   Peds negative pediatric ROS (+)  Hematology negative hematology ROS (+)   Anesthesia Other Findings Rectal Cancer  Reproductive/Obstetrics negative OB ROS                                                              Anesthesia Evaluation  Patient identified by MRN, date of birth, ID band Patient awake    Reviewed: Allergy & Precautions, NPO status , Patient's Chart, lab work & pertinent test results  Airway Mallampati: I  TM Distance: >3 FB Neck ROM: Full    Dental   Pulmonary    Pulmonary exam normal        Cardiovascular Normal cardiovascular exam     Neuro/Psych Seizures -, Well Controlled,     GI/Hepatic   Endo/Other    Renal/GU      Musculoskeletal   Abdominal   Peds  Hematology   Anesthesia Other Findings   Reproductive/Obstetrics                             Anesthesia Physical Anesthesia Plan  ASA: II  Anesthesia Plan: MAC   Post-op Pain Management:    Induction: Intravenous  PONV Risk Score and Plan: 1 and Ondansetron  Airway Management Planned: Simple Face Mask  Additional Equipment:    Intra-op Plan:   Post-operative Plan:   Informed Consent: I have reviewed the patients History and Physical, chart, labs and discussed the procedure including the risks, benefits and alternatives for the proposed anesthesia with the patient or authorized representative who has indicated his/her understanding and acceptance.     Plan Discussed with: CRNA and Surgeon  Anesthesia Plan Comments:         Anesthesia Quick Evaluation  Anesthesia Physical Anesthesia Plan  ASA: III  Anesthesia Plan: General   Post-op Pain Management:    Induction: Intravenous, Rapid sequence and Cricoid pressure planned  PONV Risk Score and Plan: 2 and Ondansetron, Midazolam and Treatment may vary due to age or medical condition  Airway Management Planned: Oral ETT  Additional Equipment:   Intra-op Plan:   Post-operative Plan: Extubation in OR  Informed Consent: I have reviewed the patients History and Physical, chart, labs and discussed the procedure including the risks, benefits and alternatives for the proposed anesthesia with the patient or authorized representative who has indicated his/her understanding and acceptance.     Dental advisory given  Plan Discussed with: CRNA  Anesthesia Plan Comments: (PAT note written 07/18/2018  by Myra Gianotti, PA-C. )       Anesthesia Quick Evaluation

## 2018-07-18 NOTE — Progress Notes (Addendum)
Has armband been applied?  Yes  Does patient have an allergy to IV contrast dye?: No   Has patient ever received premedication for IV contrast dye?: N/A   Does patient take metformin?: No  If patient does take metformin when was the last dose: N/A  Date of lab work: 07/13/2018  BUN: 15 CR: 0.79 EGfr: >60  IV site: Left Chest Port  Has IV site been added to flowsheet?  Yes  BP 114/72   Pulse 98   Temp 98.8 F (37.1 C) (Oral)   Resp 18   Ht '5\' 10"'  (1.778 m)   Wt 166 lb 9.6 oz (75.6 kg)   SpO2 97%   BMI 23.90 kg/m

## 2018-07-18 NOTE — Progress Notes (Addendum)
Jacob [name] denies chest pain or shortness of breath. Patient denies that he or his family has experienced any of the following: Cough Fever >100.4 Runny Nose Sore Throat Difficulty breathing/ shortness of breath Travel in past 14 days-none  Jacob [name] asked me to complete the call with his wife Jacob [name], patient was going in for radiation treatment. Jacob [name] reports that since seizure last year and radition treatment for brain tumor that Jacob [name] has memory loss and is very forgetful and confused at times.  Jacob [name] is concerned for patient to come to procedure alone, "I do not know if he will be able to sign consent." I told Jacob [name] that I would call the Endoscopy Department and that if she is allowed to come in with patient, that  She will be notified. I called Endo and spoke with [facility] and informed  her of Jacob [name] concerns.

## 2018-07-18 NOTE — Progress Notes (Signed)
Anesthesia Chart Review: SAME DAY WORK-UP (ENDO)  Case:  [id number] Date/Time:  07/19/18 0930   Procedures:      ENDOSCOPIC RETROGRADE CHOLANGIOPANCREATOGRAPHY (ERCP) WITH PROPOFOL (N/A )     BILIARY STENT PLACEMENT (N/A )   Anesthesia type:  General   Pre-op diagnosis:      metastaic rectal cancer     liver lesion   Location:  [facility] ENDO ROOM 1 / [place] [facility]   Surgeon:  Mansouraty, Telford Nab., MD      DISCUSSION: Patient is a 48 year old male scheduled for the above procedure.   History includes never smoker, rectal cancer with liver/lung/brain metastasis (diangosis 03/17/12; s/p radiation 03/20/12-04/27/12; s/p laparoscopic low anterior resection, diverting ileostomy 06/29/12, ileostomy takedown 12/07/12; right hepatic lobectomy 09/17/13; palliative laparoscopic abdominoperineal resection for coloanal stricture 08/17/16; s/p chemotherapy 04/30/15-01/18/18 and 02/02/18-), left vocal cord paralysis (s/p Prolarynx injection 05/16/17; laryngoplasty 02/10/18), peripheral neuropathy, seizures (new onset 08/05/17, with known brain mets and mild encephalopathy from hepatobiliary dysfunction), anemia.  He was last seen by [facility] 07/13/18 with development of progressive jaundice with progressive disease by CT and intrahepatic biliary dilatation. Dr. Ammie Dalton suspected biliary obstruction related to tumor in the central liver. Palliative versus GI intervention discussed and patient opted to proceed with intervention (bile duct stenting). Port-a-cath flush on 07/13/18. Lonsurf held per oncology as of 07/10/18 while liver issue being addressed.  He was last seen by Neuro-Oncologist Dr. Mickeal Skinner on 07/17/18. Radiosurgery recommended for new right lesion [facility] 07/18/18). Continue dexamethasone and Lamictal.     Anesthesia team to evaluate prior to procedure. Last labs on 07/13/18 appear stable over the past month, except for significantly increased total bilirubin (2.2 06/30/18 to 17.8 on 07/13/18). PLT count 111.     PROVIDERS: Mosie Lukes, MD is PCP - Betsy Coder, MD is HEM-ONC. He also has seen Karmen Stabs, MD at [place] at Dr. Gearldine Shown request on 05/01/18.  Cecil Cobbs, MD is neuro-oncologist.  - Melissa Montane, MD is ENT   LABS: Labs as of 07/13/18 include: Lab Results  Component Value Date   WBC 5.0 07/13/2018   HGB 10.8 (L) 07/13/2018   HCT 31.7 (L) 07/13/2018   PLT 111 (L) 07/13/2018   GLUCOSE 80 07/13/2018   ALT 41 07/13/2018   AST 71 (H) 07/13/2018   NA 132 (L) 07/13/2018   K 3.6 07/13/2018   CL 98 07/13/2018   CREATININE 0.79 07/13/2018   BUN 15 07/13/2018   CO2 21 (L) 07/13/2018   Total bilirubin 2.2-17.8 since 06/30/18. PLT count 74-111 since 04/12/18.    IMAGES: MRI brain 07/12/18: IMPRESSION: 1. New 8 mm metastasis in the right middle cerebellar peduncle. 2. Mildly increased size of all other lesions, including the 5 cm left occipital lesion with slight worsening of extensive edema.  CT abd/pelvis 07/12/18: IMPRESSION: 1. Mild to moderate disease progression compared to 04/19/2018. 2. New and enlarged hepatic metastasis, with involvement of the intrahepatic IVC and increased narrowing of the portal vein/branches. 3. Developing mild intrahepatic biliary duct dilatation, likely secondary to mass effect from central tumor. 4. Progressive abdominal nodal metastasis. Similar right adrenal metastasis. 5. Slight progression of lung base metastasis. Improved left-sided aeration with decreased left and new right pleural effusion. 6. No bowel obstruction or other acute complication. 7. New small volume abdominal ascites. 8. Bilateral femoral head avascular necrosis. 9. Enlarging left renal lesion, suspicious for metastasis.  CT chest 04/19/18: IMPRESSION: 1. Progression of the LEFT hilar mass with now complete  obstruction of the LEFT mainstem bronchus. Complete collapse of the LEFT upper lobe and LEFT lower lobe with fluid filling the entire  LEFT hemithorax. 2. Bulky mediastinal adenopathy unchanged. 3. Small pulmonary nodules in the RIGHT lung consistent with metastasis. (Two small nodules noted).   EKG: 08/05/17: Normal sinus rhythm Borderline low voltage, extremity leads no significant change since 2014 Confirmed by Sherwood Gambler [phone]) on 08/05/2017 4:14:22 PM   CV: Echo 12/30/17: Study Conclusions - Left ventricle: The cavity size was normal. Systolic function was   normal. The estimated ejection fraction was in the range of 55%   to 60%. Wall motion was normal; there were no regional wall   motion abnormalities. Left ventricular diastolic function   parameters were normal. - Mitral valve: Thickening. Impressions: - Normal systolic and diastolic function.   Mild thickening of the anterior leaflet of the mitral valve. No   stenosis, no regurgitation. Normal mobility   Past Medical History:  Diagnosis Date  . Allergic state 01/19/2012  . Anemia   . Cancer ([place]) 03/06/12   rectal bx=Adenocarcinoma  PT HAD RADIATION , CHEMO SURGERY  . Cancer of brain [facility])    radiation  . Cancer of lung ([place])   . Chicken pox as a child   ?  Marland Kitchen Dysrhythmia as child   HX OF IRREGULAR HB AT TIME OF TONSILLECTOMY - SURGERY WAS NOT DONE-CAN'T REMEMBER ANY OTHER DETAILS- NEVER HAD THE SURGERY.  . Elevated liver function tests 01/19/2012  . Fatty liver   . Hernia 6 months old  . History of diarrhea    better since chemo finished  . Hx of migraines   . Lung abnormality 2015  . Lung nodule 08/2013  . Lung nodule, multiple 11/29/2013  . Memory loss   . Migraine headache as a child  . Numbness    TOES OF BOTH FEET.  Marland Kitchen Overweight(278.02) 01/19/2012  . Pain    LEFT HEEL PAIN -SEVERE ESPECIALLY AFTER SITTING OR LYING DOWN - DIFFICULT TO GET OUT OF BED AND WALK IN THE MORNINGS BECAUSE OF HEEL PAIN  . 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 Preventative health care 01/19/2012  .  Radiation 03/20/12-04/27/12   Pelvis 50.4 gray Rectal cancer  . Rectal cancer ([place]) 03/06/12   biopsy-adenocarcinoma  . Reflux 01/19/2012  . Seizure ([place])    last one 2019  . Sun-damaged skin 01/19/2012    Past Surgical History:  Procedure Laterality Date  . CHOLECYSTECTOMY N/A 09/17/2013   Procedure: CHOLECYSTECTOMY;  Surgeon: Stark Klein, MD;  Location: [place];  Service: General;  Laterality: N/A;  . COLOSTOMY TAKEDOWN N/A 08/17/2016   Procedure: LAPAROSCOPIC ABDOMINOPERINEAL RESECTION WITH PERMANENT COLOSTOMY;  Surgeon: Leighton Ruff, MD;  Location: [facility];  Service: General;  Laterality: N/A;  . CYST EXCISION     L EAR AREA  . EUS  03/14/2012   Procedure: LOWER ENDOSCOPIC ULTRASOUND (EUS);  Surgeon: Milus Banister, MD;  Location: [facility];  Service: Endoscopy;  Laterality: N/A;  . HERNIA REPAIR  6 months old   right inguinal repair  . ILEOSTOMY CLOSURE N/A 12/07/2012   Procedure: ILEOSTOMY TAKEDOWN;  Surgeon: Leighton Ruff, MD;  Location: [facility];  Service: General;  Laterality: N/A;  . LAPAROSCOPIC LOW ANTERIOR RESECTION N/A 06/29/2012   Procedure: LAPAROSCOPIC LOW ANTERIOR RESECTION, mobilization splenic flexure,coloanal anastomosis,diverting ileostomy;  Surgeon: Leighton Ruff, MD;  Location: [facility];  Service: General;  Laterality: N/A;  . LAPAROSCOPY N/A 09/17/2013  Procedure: LAPAROSCOPY DIAGNOSTIC;  Surgeon: Stark Klein, MD;  Location: [place];  Service: General;  Laterality: N/A;  . LARYNGOPLASTY Left 02/10/2018   Procedure: LARYNGOPLASTY;  Surgeon: Melissa Montane, MD;  Location: [place];  Service: ENT;  Laterality: Left;  . LIVER ULTRASOUND N/A 09/17/2013   Procedure: LIVER ULTRASOUND;  Surgeon: Stark Klein, MD;  Location: [place];  Service: General;  Laterality: N/A;  . MICROLARYNGOSCOPY W/VOCAL CORD INJECTION Left 05/16/2017   Procedure: MICROLARYNGOSCOPY WITH VOCAL CORD INJECTION;  Surgeon: Melissa Montane, MD;  Location: [place];  Service: ENT;  Laterality: Left;   . OPEN HEPATECTOMY  N/A 09/17/2013   Procedure: OPEN HEPATECTOMY;  Surgeon: Stark Klein, MD;  Location: [place];  Service: General;  Laterality: N/A;  . PORTACATH PLACEMENT Left 03/21/2013   Procedure: INSERTION PORT-A-CATH;  Surgeon: Leighton Ruff, MD;  Location: [facility];  Service: General;  Laterality: Left;  . RECTAL BIOPSY  03/06/12   distal mass1-2cm from anal verge=Adenocarcinoma  . TONSILLECTOMY      MEDICATIONS: No current facility-administered medications for this encounter.    Marland Kitchen acetaminophen (TYLENOL) 500 MG tablet  . clotrimazole (MYCELEX) 10 MG troche  . dexamethasone (DECADRON) 2 MG tablet  . docusate sodium (STOOL SOFTENER) 100 MG capsule  . dronabinol (MARINOL) 5 MG capsule  . fluticasone (FLOVENT HFA) 110 MCG/ACT inhaler  . furosemide (LASIX) 20 MG tablet  . lamoTRIgine (LAMICTAL) 100 MG tablet  . lidocaine-prilocaine (EMLA) cream  . ondansetron (ZOFRAN) 4 MG tablet  . prochlorperazine (COMPAZINE) 10 MG tablet  . sennosides-docusate sodium (SENOKOT-S) 8.6-50 MG tablet  . tiZANidine (ZANAFLEX) 4 MG tablet  . albuterol (VENTOLIN HFA) 108 (90 Base) MCG/ACT inhaler  . dexamethasone (DECADRON) 1 MG tablet  . trifluridine-tipiracil (LONSURF) 15-6.14 MG tablet   . heparin lock flush 100 unit/mL  . sodium chloride flush (NS) 0.9 % injection 10 mL    Myra Gianotti, PA-C Surgical Short Stay/Anesthesiology [facility] Phone [phone] [facility] Phone [phone] 07/18/2018 4:22 PM

## 2018-07-18 NOTE — Telephone Encounter (Signed)
FYI:  IV Contrast to be used today for simulation procedure for new brain mets at the [place].

## 2018-07-19 ENCOUNTER — Ambulatory Visit (HOSPITAL_COMMUNITY): Payer: BC Managed Care – PPO | Admitting: Vascular Surgery

## 2018-07-19 ENCOUNTER — Encounter (HOSPITAL_COMMUNITY)
Admission: RE | Disposition: A | Discharge status: Home / Self Care | Payer: Self-pay | Source: Home / Self Care | Attending: Gastroenterology

## 2018-07-19 ENCOUNTER — Ambulatory Visit (HOSPITAL_COMMUNITY)
Admission: RE | Admit: 2018-07-19 | Discharge: 2018-07-19 | Disposition: A | Discharge status: Home / Self Care | Payer: BC Managed Care – PPO | Attending: Gastroenterology | Admitting: Gastroenterology

## 2018-07-19 ENCOUNTER — Other Ambulatory Visit: Payer: Self-pay | Admitting: Family Medicine

## 2018-07-19 ENCOUNTER — Other Ambulatory Visit: Payer: Self-pay

## 2018-07-19 ENCOUNTER — Ambulatory Visit (HOSPITAL_COMMUNITY): Payer: BC Managed Care – PPO

## 2018-07-19 ENCOUNTER — Other Ambulatory Visit: Payer: BC Managed Care – PPO

## 2018-07-19 ENCOUNTER — Encounter (HOSPITAL_COMMUNITY): Payer: Self-pay | Admitting: Gastroenterology

## 2018-07-19 ENCOUNTER — Ambulatory Visit: Payer: BC Managed Care – PPO | Admitting: Nurse Practitioner

## 2018-07-19 ENCOUNTER — Telehealth: Payer: Self-pay

## 2018-07-19 DIAGNOSIS — Z7951 Long term (current) use of inhaled steroids: Secondary | ICD-10-CM | POA: Insufficient documentation

## 2018-07-19 DIAGNOSIS — R160 Hepatomegaly, not elsewhere classified: Secondary | ICD-10-CM

## 2018-07-19 DIAGNOSIS — Z880 Allergy status to penicillin: Secondary | ICD-10-CM | POA: Diagnosis not present

## 2018-07-19 DIAGNOSIS — K269 Duodenal ulcer, unspecified as acute or chronic, without hemorrhage or perforation: Secondary | ICD-10-CM

## 2018-07-19 DIAGNOSIS — C7971 Secondary malignant neoplasm of right adrenal gland: Secondary | ICD-10-CM | POA: Insufficient documentation

## 2018-07-19 DIAGNOSIS — R569 Unspecified convulsions: Secondary | ICD-10-CM | POA: Diagnosis not present

## 2018-07-19 DIAGNOSIS — Z923 Personal history of irradiation: Secondary | ICD-10-CM | POA: Diagnosis not present

## 2018-07-19 DIAGNOSIS — Z9049 Acquired absence of other specified parts of digestive tract: Secondary | ICD-10-CM | POA: Diagnosis not present

## 2018-07-19 DIAGNOSIS — C20 Malignant neoplasm of rectum: Secondary | ICD-10-CM | POA: Insufficient documentation

## 2018-07-19 DIAGNOSIS — R51 Headache: Secondary | ICD-10-CM | POA: Diagnosis not present

## 2018-07-19 DIAGNOSIS — J90 Pleural effusion, not elsewhere classified: Secondary | ICD-10-CM | POA: Insufficient documentation

## 2018-07-19 DIAGNOSIS — Z9221 Personal history of antineoplastic chemotherapy: Secondary | ICD-10-CM | POA: Diagnosis not present

## 2018-07-19 DIAGNOSIS — Z8249 Family history of ischemic heart disease and other diseases of the circulatory system: Secondary | ICD-10-CM | POA: Diagnosis not present

## 2018-07-19 DIAGNOSIS — Z888 Allergy status to other drugs, medicaments and biological substances status: Secondary | ICD-10-CM | POA: Diagnosis not present

## 2018-07-19 DIAGNOSIS — C3490 Malignant neoplasm of unspecified part of unspecified bronchus or lung: Secondary | ICD-10-CM | POA: Insufficient documentation

## 2018-07-19 DIAGNOSIS — T17908A Unspecified foreign body in respiratory tract, part unspecified causing other injury, initial encounter: Secondary | ICD-10-CM

## 2018-07-19 DIAGNOSIS — Z809 Family history of malignant neoplasm, unspecified: Secondary | ICD-10-CM | POA: Diagnosis not present

## 2018-07-19 DIAGNOSIS — K760 Fatty (change of) liver, not elsewhere classified: Secondary | ICD-10-CM | POA: Insufficient documentation

## 2018-07-19 DIAGNOSIS — K831 Obstruction of bile duct: Secondary | ICD-10-CM | POA: Diagnosis not present

## 2018-07-19 DIAGNOSIS — R188 Other ascites: Secondary | ICD-10-CM | POA: Diagnosis not present

## 2018-07-19 DIAGNOSIS — Z79899 Other long term (current) drug therapy: Secondary | ICD-10-CM | POA: Diagnosis not present

## 2018-07-19 DIAGNOSIS — D630 Anemia in neoplastic disease: Secondary | ICD-10-CM | POA: Diagnosis not present

## 2018-07-19 DIAGNOSIS — Z9889 Other specified postprocedural states: Secondary | ICD-10-CM | POA: Diagnosis not present

## 2018-07-19 DIAGNOSIS — K838 Other specified diseases of biliary tract: Secondary | ICD-10-CM | POA: Diagnosis not present

## 2018-07-19 DIAGNOSIS — C787 Secondary malignant neoplasm of liver and intrahepatic bile duct: Secondary | ICD-10-CM | POA: Insufficient documentation

## 2018-07-19 HISTORY — PX: SPHINCTEROTOMY: SHX5544

## 2018-07-19 HISTORY — DX: Malignant neoplasm of brain, unspecified: C71.9

## 2018-07-19 HISTORY — PX: ENDOSCOPIC RETROGRADE CHOLANGIOPANCREATOGRAPHY (ERCP) WITH PROPOFOL: SHX5810

## 2018-07-19 HISTORY — PX: BILIARY DILATION: SHX6850

## 2018-07-19 HISTORY — PX: PANCREATIC STENT PLACEMENT: SHX5539

## 2018-07-19 HISTORY — DX: Malignant neoplasm of unspecified part of unspecified bronchus or lung: C34.90

## 2018-07-19 HISTORY — DX: Other amnesia: R41.3

## 2018-07-19 HISTORY — PX: BILIARY STENT PLACEMENT: SHX5538

## 2018-07-19 HISTORY — DX: Unspecified convulsions: R56.9

## 2018-07-19 HISTORY — PX: REMOVAL OF STONES: SHX5545

## 2018-07-19 SURGERY — ENDOSCOPIC RETROGRADE CHOLANGIOPANCREATOGRAPHY (ERCP) WITH PROPOFOL
Anesthesia: General

## 2018-07-19 MED ORDER — ONDANSETRON HCL 4 MG/2ML IJ SOLN
4.0000 mg | Freq: Once | INTRAMUSCULAR | Status: AC
  Administered 2018-07-19: 4 mg via INTRAVENOUS

## 2018-07-19 MED ORDER — ONDANSETRON HCL 4 MG/2ML IJ SOLN
INTRAMUSCULAR | Status: AC
  Filled 2018-07-19: qty 2

## 2018-07-19 MED ORDER — GLUCAGON HCL RDNA (DIAGNOSTIC) 1 MG IJ SOLR
INTRAMUSCULAR | Status: AC
  Filled 2018-07-19: qty 1

## 2018-07-19 MED ORDER — FENTANYL CITRATE (PF) 100 MCG/2ML IJ SOLN
INTRAMUSCULAR | Status: DC | PRN
  Administered 2018-07-19: 100 ug via INTRAVENOUS

## 2018-07-19 MED ORDER — PROPOFOL 10 MG/ML IV BOLUS
INTRAVENOUS | Status: DC | PRN
  Administered 2018-07-19: 150 mg via INTRAVENOUS

## 2018-07-19 MED ORDER — OMEPRAZOLE 40 MG PO CPDR: 40.0000 mg | DELAYED_RELEASE_CAPSULE | Freq: Two times a day (BID) | ORAL | 4 refills | Status: AC

## 2018-07-19 MED ORDER — SODIUM CHLORIDE 0.9 % IV SOLN: INTRAVENOUS | Status: DC

## 2018-07-19 MED ORDER — LIDOCAINE HCL (CARDIAC) PF 100 MG/5ML IV SOSY
PREFILLED_SYRINGE | INTRAVENOUS | Status: DC | PRN
  Administered 2018-07-19: 80 mg via INTRAVENOUS

## 2018-07-19 MED ORDER — GLUCAGON HCL RDNA (DIAGNOSTIC) 1 MG IJ SOLR
INTRAMUSCULAR | Status: DC | PRN
  Administered 2018-07-19 (×3): 0.25 mg via INTRAVENOUS

## 2018-07-19 MED ORDER — SUCCINYLCHOLINE CHLORIDE 20 MG/ML IJ SOLN
INTRAMUSCULAR | Status: DC | PRN
  Administered 2018-07-19: 120 mg via INTRAVENOUS

## 2018-07-19 MED ORDER — CIPROFLOXACIN IN D5W 400 MG/200ML IV SOLN
INTRAVENOUS | Status: AC
  Filled 2018-07-19: qty 200

## 2018-07-19 MED ORDER — SODIUM CHLORIDE 0.9 % IV SOLN
INTRAVENOUS | Status: DC | PRN
  Administered 2018-07-19: 11:00:00 100 mL

## 2018-07-19 MED ORDER — PHENYLEPHRINE HCL (PRESSORS) 10 MG/ML IV SOLN
INTRAVENOUS | Status: DC | PRN
  Administered 2018-07-19 (×5): 80 ug via INTRAVENOUS

## 2018-07-19 MED ORDER — ONDANSETRON HCL 4 MG/2ML IJ SOLN
INTRAMUSCULAR | Status: DC | PRN
  Administered 2018-07-19: 4 mg via INTRAVENOUS

## 2018-07-19 MED ORDER — OXYCODONE HCL 5 MG/5ML PO SOLN: 5.0000 mg | Freq: Once | ORAL | Status: DC | PRN

## 2018-07-19 MED ORDER — CIPROFLOXACIN HCL 500 MG PO TABS: 500.0000 mg | ORAL_TABLET | Freq: Two times a day (BID) | ORAL | 0 refills | Status: AC

## 2018-07-19 MED ORDER — LACTATED RINGERS IV SOLN
INTRAVENOUS | Status: DC
  Administered 2018-07-19: 09:00:00 via INTRAVENOUS

## 2018-07-19 MED ORDER — PROMETHAZINE HCL 25 MG/ML IJ SOLN: 6.2500 mg | INTRAMUSCULAR | Status: DC | PRN

## 2018-07-19 MED ORDER — CIPROFLOXACIN IN D5W 400 MG/200ML IV SOLN
400.0000 mg | Freq: Once | INTRAVENOUS | Status: AC
  Administered 2018-07-19: 400 mg via INTRAVENOUS

## 2018-07-19 MED ORDER — INDOMETHACIN 50 MG RE SUPP
RECTAL | Status: DC | PRN
  Administered 2018-07-19: 100 mg via RECTAL

## 2018-07-19 MED ORDER — OXYCODONE HCL 5 MG PO TABS: 5.0000 mg | ORAL_TABLET | Freq: Once | ORAL | Status: DC | PRN

## 2018-07-19 MED ORDER — INDOMETHACIN 50 MG RE SUPP
RECTAL | Status: AC
  Filled 2018-07-19: qty 2

## 2018-07-19 MED ORDER — HYDROMORPHONE HCL 1 MG/ML IJ SOLN: 0.2500 mg | INTRAMUSCULAR | Status: DC | PRN

## 2018-07-19 NOTE — Anesthesia Procedure Notes (Signed)
Procedure Name: Intubation Date/Time: 07/19/2018 10:01 AM Performed by: Normal Recinos T, CRNA Pre-anesthesia Checklist: Patient identified, Emergency Drugs available, Suction available and Patient being monitored Patient Re-evaluated:Patient Re-evaluated prior to induction Oxygen Delivery Method: Circle system utilized Preoxygenation: Pre-oxygenation with 100% oxygen Induction Type: IV induction, Rapid sequence and Cricoid Pressure applied Laryngoscope Size: Miller and 2 Grade View: Grade I Tube type: Oral Tube size: 7.5 mm Number of attempts: 1 Airway Equipment and Method: Patient positioned with wedge pillow and Stylet Placement Confirmation: ETT inserted through vocal cords under direct vision,  positive ETCO2 and breath sounds checked- equal and bilateral Secured at: 22 cm Tube secured with: Tape Dental Injury: Teeth and Oropharynx as per pre-operative assessment

## 2018-07-19 NOTE — Anesthesia Postprocedure Evaluation (Signed)
Anesthesia Post Note  Patient: Jacob [name]  Procedure(s) Performed: ENDOSCOPIC RETROGRADE CHOLANGIOPANCREATOGRAPHY (ERCP) WITH PROPOFOL (N/A ) BILIARY STENT PLACEMENT (N/A ) SPHINCTEROTOMY BILIARY DILATION REMOVAL OF STONES PANCREATIC STENT PLACEMENT     Patient location during evaluation: PACU Anesthesia Type: General Level of consciousness: awake and alert Pain management: pain level controlled Vital Signs Assessment: post-procedure vital signs reviewed and stable Respiratory status: spontaneous breathing, nonlabored ventilation and respiratory function stable Cardiovascular status: blood pressure returned to baseline and stable Postop Assessment: no apparent nausea or vomiting Anesthetic complications: no    Last Vitals:  Vitals:   07/19/18 1325 07/19/18 1335  BP: 133/88 127/75  Pulse: 77 65  Resp: (!) 22 14  Temp:    SpO2: 99% 96%    Last Pain:  Vitals:   07/19/18 1315  TempSrc:   PainSc: 0-No pain                 Lynda Rainwater

## 2018-07-19 NOTE — Transfer of Care (Signed)
Immediate Anesthesia Transfer of Care Note  Patient: Jacob [name]  Procedure(s) Performed: ENDOSCOPIC RETROGRADE CHOLANGIOPANCREATOGRAPHY (ERCP) WITH PROPOFOL (N/A ) BILIARY STENT PLACEMENT (N/A ) SPHINCTEROTOMY BILIARY DILATION REMOVAL OF STONES PANCREATIC STENT PLACEMENT  Patient Location: Endoscopy Unit  Anesthesia Type:General  Level of Consciousness: drowsy  Airway & Oxygen Therapy: Patient Spontanous Breathing and Patient connected to nasal cannula oxygen  Post-op Assessment: Report given to RN, Post -op Vital signs reviewed and stable and Patient moving all extremities  Post vital signs: Reviewed and stable  Last Vitals:  Vitals Value Taken Time  BP    Temp    Pulse    Resp    SpO2      Last Pain:  Vitals:   07/19/18 0904  TempSrc: Oral  PainSc: 3          Complications: No apparent anesthesia complications

## 2018-07-19 NOTE — Discharge Instructions (Signed)
YOU HAD AN ENDOSCOPIC PROCEDURE TODAY: Refer to the procedure report and other information in the discharge instructions given to you for any specific questions about what was found during the examination. If this information does not answer your questions, please call [place] office at [phone] to clarify.  ° °YOU SHOULD EXPECT: Some feelings of bloating in the abdomen. Passage of more gas than usual. Walking can help get rid of the air that was put into your GI tract during the procedure and reduce the bloating. If you had a lower endoscopy (such as a colonoscopy or flexible sigmoidoscopy) you may notice spotting of blood in your stool or on the toilet paper. Some abdominal soreness may be present for a day or two, also. ° °DIET: Your first meal following the procedure should be a light meal and then it is ok to progress to your normal diet. A half-sandwich or bowl of soup is an example of a good first meal. Heavy or fried foods are harder to digest and may make you feel nauseous or bloated. Drink plenty of fluids but you should avoid alcoholic beverages for 24 hours. If you had a esophageal dilation, please see attached instructions for diet.   ° °ACTIVITY: Your care partner should take you home directly after the procedure. You should plan to take it easy, moving slowly for the rest of the day. You can resume normal activity the day after the procedure however YOU SHOULD NOT DRIVE, use power tools, machinery or perform tasks that involve climbing or major physical exertion for 24 hours (because of the sedation medicines used during the test).  ° °SYMPTOMS TO REPORT IMMEDIATELY: °A gastroenterologist can be reached at any hour. Please call [phone]  for any of the following symptoms:  °Following lower endoscopy (colonoscopy, flexible sigmoidoscopy) °Excessive amounts of blood in the stool  °Significant tenderness, worsening of abdominal pains  °Swelling of the abdomen that is new, acute  °Fever of 100° or  higher  °Following upper endoscopy (EGD, EUS, ERCP, esophageal dilation) °Vomiting of blood or coffee ground material  °New, significant abdominal pain  °New, significant chest pain or pain under the shoulder blades  °Painful or persistently difficult swallowing  °New shortness of breath  °Black, tarry-looking or red, bloody stools ° °FOLLOW UP:  °If any biopsies were taken you will be contacted by phone or by letter within the next 1-3 weeks. Call [phone]  if you have not heard about the biopsies in 3 weeks.  °Please also call with any specific questions about appointments or follow up tests. ° °

## 2018-07-19 NOTE — Op Note (Addendum)
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 Sherrill Medicines:                General Anesthesia, Cipro 400 mg IV, Indomethacin                            100 mg per ostomy Complications:            No immediate complications. Estimated Blood Loss:     Estimated blood loss was minimal. Procedure:                Pre-Anesthesia Assessment:                           - Prior to the procedure, a History and Physical                            was performed, and patient medications and                            allergies were reviewed. The patient's tolerance of                            previous anesthesia was also reviewed. The risks                            and benefits of the procedure and the sedation                            options and risks were discussed with the patient.  All questions were answered, and informed consent                            was obtained. Prior Anticoagulants: The patient has                            taken no previous anticoagulant or antiplatelet                            agents. ASA Grade Assessment: III - A patient with                            severe systemic disease. After reviewing the risks                             and benefits, the patient was deemed in                            satisfactory condition to undergo the procedure.                           After obtaining informed consent, the scope was                            passed under direct vision. Throughout the                            procedure, the patient's blood pressure, pulse, and                            oxygen saturations were monitored continuously. The                            TJF-Q180V ([id number]) Olympus duodenoscope was                            introduced through the mouth, and used to inject                            contrast into and used to inject contrast into the                            bile duct. The ERCP was technically difficult and                            complex due to challenging cannulation. Successful                            completion of the procedure was aided by performing                            the maneuvers documented (below) in this report.  The patient tolerated the procedure. Scope In: Scope Out: Findings:      A scout film of the abdomen was obtained. Surgical clips, consistent       with a previous cholecystectomy, were seen in the area of the right       upper quadrant of the abdomen.      The upper GI tract was traversed under direct vision without detailed       examination. One non-bleeding cratered duodenal ulcer with a clean ulcer       base (Forrest Class III) was found in the second portion of the duodenum       proximal to the ampulla. The lesion was 13 mm in largest dimension. The       major papilla was normal but it was found in a very short position that       required constant tension/pull back on the duodenoscope to maintain.      Two attempts at biliary cannulation were not successful while using a       wire-guided approach, and this led to placement of the wire in the       pancreatic duct on 2 occasions. Decision  was made to pursue a       double-wire approach. A short 0.035 inch Soft Jagwire was left in the       ventral pancreatic duct.      Using a second short 0.035 inch Soft Jagwire this was able to be passed       into the biliary tree. The Jagtome sphincterotome was passed over the       guidewire and the bile duct was then deeply cannulated. Contrast was       injected. I personally interpreted the bile duct images. The flow of       contrast through the upper biliary ducts was poor. Image quality was       adequate. Contrast extended to the hepatic ducts. Opacification of the       main bile duct, right main hepatic duct and right intrahepatic branches       was successful. The hepatic duct bifurcation contained a single severe       localized stenosis >30 mm in length that extended into the right main       duct and a branch of the right intrahepatics. There was notation of some       right intrahepatic branches that were moderately dilated (largest noted       to be approximately 7 mm), secondary to what was beleived to be this       aforementioned stricture, but there also appeared to be other ducts that       were not dilated. 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 A 6 mm biliary sphincterotomy was made       with a monofilament Jagtome sphincterotome using ERBE electrocautery.       There was self limited oozing from the sphincterotomy which did not       require treatment. To discover objects, the biliary tree was swept with       a retrieval balloon starting at the upper third of the main bile duct.       Nothing was found. The sphincterotome and the above-balloon could not       traverse the region of the CHD into the right main due to the stricture.  We pursued dilation of the upper third of the main bile duct, the       hepatic duct bifurcation and the right main hepatic duct with multiple       segments using a Hurricane 4 mm  balloon and then going back with a       Hurricane 6 mm balloon dilator. To discover objects, the biliary tree       was swept with a retrieval balloon starting at the right main hepatic       duct. Clots were swept from the duct from recent dilation. An occlusion       cholangiogram was performed that showed the aforementioned stricture. I       tried on multiple attempts to get a wire into the largest dilated right       intrahepatic, but it was not clear that I was able to do that. I then       tried a 0.035 inch x 260 cm angled Hydra Jagwire was passed into the       biliary tree over the sphincterotome and tried to get it to go into a       larger duct, and it seemed to slightly, but could not keep passing more       deeply. To discover objects, the biliary tree was swept with a retrieval       balloon starting at the right main hepatic duct. Sludge and clots were       swept from the duct. I could not get the sphincterotome to pass beyond       14 cm into what was felt to be a more dilated intrahepatic duct.       Decision was made to attempt to traverse the CHD and the majority of the       right main in effort of improving drainage. One 7 Fr by 12 cm       transpapillary plastic biliary stent with a single external flap and a       single internal flap was placed into the right hepatic duct and into a       right intrahepatic. Bile flowed through the stent. The stent was in good       position.      One 4 Fr by 7 cm temporary plastic pancreatic stent with a single       external pigtail and no internal flaps was placed into the ventral       pancreatic duct to decrease risk of post-ERCP pancreatitis earlier in       case. The stent was in good position. A pancreatogram was not performed.      The duodenoscope was withdrawn from the patient. Impression:               - Non-bleeding duodenal ulcer with a clean ulcer                            base (Forrest Class III) that was proximal  to the                            ampulla.                           - The major papilla appeared normal.                           -  Double wire technique necessary for biliary                            cannulation.                           - A single localized severe biliary stricture was                            found in the CHD/Right Main into a Right                            intrahepatic. The stricture was malignant appearing.                           - Multipel right intrahepatic branches were                            moderately dilated but there were areas of                            non-dilated                           - A biliary sphincterotomy was performed.                           - The stenotic region was successfully dilated.                           - The biliary tree was swept and clots were found                            after the dilations performed.                           - The biliary tree was swept and sludge and clots                            were found.                           - One plastic biliary stent was placed into the                            right hepatic duct.                           - One temporary plastic pancreatic stent was placed                            into the ventral pancreatic duct to decrease risk                            of post-ERCP pancreatitis. Recommendation:           -  The patient will be observed post-procedure,                            until all discharge criteria are met.                           - Avoid aspirin and nonsteroidal anti-inflammatory                            medicines for 1 week.                           - Discharge patient to home.                           - Patient has a contact number available for                            emergencies. The signs and symptoms of potential                            delayed complications were discussed with the                            patient.  Return to normal activities tomorrow.                            Written discharge instructions were provided to the                            patient.                           - Observe patient's clinical course.                           - Ciprofloxacin 500 mg BID for 5-days in effort of                            decreasing post-infectious ERCP complications as                            drainage was slow and not completely sure that                            entire right sided region will drain adequately.                           - Omeprazole 40 mg BID for next 2-3 months and plan                            to recheck the                           - Check liver enzymes (AST, ALT, alkaline  phosphatase, bilirubin) in 1 week.                           - Repeat ERCP in 4 months to exchange stent.                           - Send H. pylori antigen and treat if positive with                            triple/quadruple therapy in future.                           - Watch for pancreatitis, bleeding, perforation,                            and cholangitis.                           - If the stent remains in good position and there                            is not progressive disease on cross-sectional                            imaging, but there is not adequate decrease in                            liver biochemical testing, then I think next step                            would be an Interventional Radiology approach via                            PBD as there does seem to be increasing ductal                            dilation at this point in time comparatively to                            previous CT scan suggesting burgeoning/progressing                            ductal obstruction. It cannot be reached with our                            current ERCP techniques. He would likely need to                            have ERCP for stent removal if this is  the case                            before IR procedure. If the patient does well and  LFTs near normalize then would plan for repeat ERCP                            in 3-6 months, and could consider multiple                            intraductal uncovered stents in effort of trying                            not to jail off the other biliary ducts.                           - KUB to be arranged in 10-14 days for Prophylactic                            stent.                           - The findings and recommendations were discussed                            with the patient.                           - The findings and recommendations were discussed                            with the patient's family. Procedure Code(s):        --- Professional ---                           [phone], Endoscopic retrograde                            cholangiopancreatography (ERCP); with placement of                            endoscopic stent into biliary or pancreatic duct,                            including pre- and post-dilation and guide wire                            passage, when performed, including sphincterotomy,                            when performed, each stent                           [id number], 36, Endoscopic retrograde                            cholangiopancreatography (ERCP); with placement of                            endoscopic stent into biliary  or pancreatic duct,                            including pre- and post-dilation and guide wire                            passage, when performed, including sphincterotomy,                            when performed, each stent                           [id number], Endoscopic retrograde                            cholangiopancreatography (ERCP); with removal of                            calculi/debris from biliary/pancreatic duct(s) Diagnosis Code(s):        --- Professional ---                           K26.9, Duodenal  ulcer, unspecified as acute or                            chronic, without hemorrhage or perforation                           K83.1, Obstruction of bile duct                           R93.5, Abnormal findings on diagnostic imaging of                            other abdominal regions, including retroperitoneum                           K83.8, Other specified diseases of biliary tract CPT copyright 2019 [employer]. All rights reserved. The codes documented in this report are preliminary and upon coder review may  be revised to meet current compliance requirements. Justice Britain, MD 07/19/2018 12:49:30 PM Number of Addenda: 0

## 2018-07-19 NOTE — H&P (Addendum)
GASTROENTEROLOGY OUTPATIENT PROCEDURE H&P NOTE   Primary Care Physician: Mosie Lukes, MD  HPI: Jacob [name] is a 48 y.o. male who presents for ERCP.  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   Dysrhythmia as child   HX OF IRREGULAR HB AT TIME OF TONSILLECTOMY - SURGERY WAS NOT DONE-CAN'T REMEMBER ANY OTHER DETAILS- NEVER HAD THE SURGERY.   Elevated liver function tests 01/19/2012   Fatty liver    Hernia 6 months old   History of diarrhea    better since chemo finished   Hx of migraines    Lung abnormality 2015   Lung nodule 08/2013   Lung nodule, multiple 11/29/2013   Memory loss    Migraine headache as a child   Numbness    TOES OF BOTH FEET.   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   Reflux 01/19/2012   Seizure ([place])    last one 2019   Sun-damaged skin 01/19/2012   Past Surgical History:  Procedure Laterality Date   CHOLECYSTECTOMY N/A 09/17/2013   Procedure: CHOLECYSTECTOMY;  Surgeon: Stark Klein, MD;  Location: [facility];  Service: General;  Laterality: N/A;   COLOSTOMY TAKEDOWN N/A 08/17/2016   Procedure: LAPAROSCOPIC ABDOMINOPERINEAL RESECTION WITH PERMANENT COLOSTOMY;  Surgeon: Leighton Ruff, MD;  Location: [facility];  Service: General;  Laterality: N/A;   CYST EXCISION     L EAR AREA   EUS  03/14/2012   Procedure: LOWER ENDOSCOPIC ULTRASOUND  (EUS);  Surgeon: Milus Banister, MD;  Location: [facility];  Service: Endoscopy;  Laterality: N/A;   HERNIA REPAIR  6 months old   right inguinal repair   ILEOSTOMY CLOSURE N/A 12/07/2012   Procedure: ILEOSTOMY TAKEDOWN;  Surgeon: Leighton Ruff, MD;  Location: [facility];  Service: General;  Laterality: N/A;   LAPAROSCOPIC LOW ANTERIOR RESECTION N/A 06/29/2012   Procedure: LAPAROSCOPIC LOW ANTERIOR RESECTION, mobilization splenic flexure,coloanal anastomosis,diverting ileostomy;  Surgeon: Leighton Ruff, MD;  Location: [facility];  Service: General;  Laterality: N/A;   LAPAROSCOPY N/A 09/17/2013   Procedure: LAPAROSCOPY DIAGNOSTIC;  Surgeon: Stark Klein, MD;  Location: [facility];  Service: General;  Laterality: N/A;   LARYNGOPLASTY Left 02/10/2018   Procedure: LARYNGOPLASTY;  Surgeon: Melissa Montane, MD;  Location: [facility];  Service: ENT;  Laterality: Left;   LIVER ULTRASOUND N/A 09/17/2013   Procedure: LIVER ULTRASOUND;  Surgeon: Stark Klein, MD;  Location: [facility];  Service: General;  Laterality: N/A;   MICROLARYNGOSCOPY W/VOCAL CORD INJECTION Left 05/16/2017   Procedure: MICROLARYNGOSCOPY WITH VOCAL CORD INJECTION;  Surgeon: Melissa Montane, MD;  Location: [facility];  Service: ENT;  Laterality: Left;   OPEN HEPATECTOMY  N/A 09/17/2013   Procedure: OPEN HEPATECTOMY;  Surgeon: Stark Klein, MD;  Location: [facility];  Service: General;  Laterality: N/A;  PORTACATH PLACEMENT Left 03/21/2013   Procedure: INSERTION PORT-A-CATH;  Surgeon: Leighton Ruff, MD;  Location: [facility];  Service: General;  Laterality: Left;   RECTAL BIOPSY  03/06/12   distal mass1-2cm from anal verge=Adenocarcinoma   TONSILLECTOMY     Current Facility-Administered Medications  Medication Dose Route Frequency Provider Last Rate Last Dose   0.9 %  sodium chloride infusion   Intravenous Continuous Mansouraty, Telford Nab., MD       ciprofloxacin (CIPRO) IVPB 400 mg  400 mg Intravenous Once Mansouraty, Telford Nab., MD        lactated ringers infusion   Intravenous Continuous Mansouraty, Telford Nab., MD 10 mL/hr at 07/19/18 0911     Allergies  Allergen Reactions   Alprazolam Other (See Comments)    Excessive sedation   Penicillins Rash    Childhood allergy Has patient had a PCN reaction causing immediate rash, facial/tongue/throat swelling, SOB or lightheadedness with hypotension: Unknown Has patient had a PCN reaction causing severe rash involving mucus membranes or skin necrosis: Unknown Has patient had a PCN reaction that required hospitalization: No Has patient had a PCN reaction occurring within the last 10 years: No If all of the above answers are "NO", then may proceed with Cephalosporin use.    Family History  Adopted: Yes  Problem Relation Age of Onset   Cancer Father    Heart disease Father    Colon cancer Neg Hx    Social History   Socioeconomic History   Marital status: Married    Spouse name: Not on file   Number of children: 1   Years of education: Not on file   Highest education level: Not on file  Occupational History   Occupation: Midwife: [employer]    Comment: [employer]  Social Designer, fashion/clothing strain: Not on file   Food insecurity:    Worry: Not on file    Inability: Not on file   Transportation needs:    Medical: No    Non-medical: No  Tobacco Use   Smoking status: Never Smoker   Smokeless tobacco: Never Used  Substance and Sexual Activity   Alcohol use: Not Currently    Alcohol/week: 0.0 standard drinks    Comment: RARE    Drug use: No    Comment: marijuana past   Sexual activity: Yes    Partners: Female  Lifestyle   Physical activity:    Days per week: Not on file    Minutes per session: Not on file   Stress: Not on file  Relationships   Social connections:    Talks on phone: Not on file    Gets together: Not on file    Attends religious service: Not on file    Active member of club or organization: Not on file      Attends meetings of clubs or organizations: Not on file    Relationship status: Not on file   Intimate partner violence:    Fear of current or ex partner: Not on file    Emotionally abused: Not on file    Physically abused: Not on file    Forced sexual activity: Not on file  Other Topics Concern   Not on file  Social History Narrative   Married to wife, [name]   Has one 61 year old child   Occupation: Emergency planning/management officer at [employer]    Physical Exam: Vital signs in last 24 hours: Temp:  [97.7 F (36.5 C)] 97.7  F (36.5 C) (04/29 0904) Pulse Rate:  [95] 95 (04/29 0904) Resp:  [16] 16 (04/29 0904) BP: (119)/(79) 119/79 (04/29 0904) SpO2:  [99 %] 99 % (04/29 0904) Weight:  [76.2 kg] 76.2 kg (04/29 0904)   GEN: NAD EYE: Sclerae anicteric ENT: MMM GI: TTP in abdomen, ostomy in place NEURO:  Alert & Oriented x 3  Lab Results: No results for input(s): WBC, HGB, HCT, PLT in the last 72 hours. BMET No results for input(s): NA, K, CL, CO2, GLUCOSE, BUN, CREATININE, CALCIUM in the last 72 hours. LFT No results for input(s): PROT, ALBUMIN, AST, ALT, ALKPHOS, BILITOT, BILIDIR, IBILI in the last 72 hours. PT/INR No results for input(s): LABPROT, INR in the last 72 hours.   Impression / Plan: This is a 48 y.o.male who presents for ERCP.  The risks of an ERCP were discussed at length, including but not limited to the risk of perforation, bleeding, abdominal pain, post-ERCP pancreatitis (while usually mild can be severe and even life threatening).  The risks and benefits of endoscopic evaluation were discussed with the patient; these include but are not limited to the risk of perforation, infection, bleeding, missed lesions, lack of diagnosis, severe illness requiring hospitalization, as well as anesthesia and sedation related illnesses.  The patient is agreeable to proceed.    Justice Britain, MD [facility] Office # [phone]

## 2018-07-19 NOTE — Telephone Encounter (Signed)
I'll have to get a KUB in 10-14 days to evaluate for the PD prophylactic stent; Jacob [name] can you arrange this? I think LFTs in 1 week   Order in Epic for KUB and labs Left message on machine to call back

## 2018-07-19 NOTE — Telephone Encounter (Signed)
-----   Message from Irving Copas., MD sent at 07/19/2018  2:00 PM EDT ----- Frann Rider, I hope you are both well. What a nice gentleman with such bad disease. Had to double wire to get in and he has what looks to be a very long stricture starting in the CHD moving into the RHD and intrahepatics.  Ductal dilation seems to be developing even further from the CT scan as there now seem to be multiple areas on the right.  I couldn't get the left system to light up.   Dilated quite a long stricture and then removed clot from the dilation. Left long stent into the right system. I wanted to get deeper but the catheter and dilators did not allow me. Drainage of the higher intrahepatics was very slow. Going to send home with antibiotics for a few days to decrease risk of infection. He wasn't having pain after procedure, but Anesthesia concerned about possible aspiration, so we will see what his CXR shows and go from there incase he needs observation overnight. I'll have to get a KUB in 10-14 days to evaluate for the PD prophylactic stent; Chong Sicilian can you arrange this? I think LFTs in 1 week. If they aren't really improving, then I'm not sure this will be able to be worked on with ERCP and likely will need PBD, though I think higher targets are now developing. I hope this helps him. Chester Holstein

## 2018-07-19 NOTE — Progress Notes (Signed)
  Radiation Oncology         [phone] ________________________________  Name: Jacob [name] MRN: [id number]  Date: 07/18/2018  DOB: Sep 01, 1970  DIAGNOSIS:     ICD-10-CM   1. Brain metastasis ([place]) C79.31     NARRATIVE:  The patient was brought to the [facility].  Identity was confirmed.  All relevant records and images related to the planned course of therapy were reviewed.  The patient freely provided informed written consent to proceed with treatment after reviewing the details related to the planned course of therapy. The consent form was witnessed and verified by the simulation staff. Intravenous access was established for contrast administration. Then, the patient was set-up in a stable reproducible supine position for radiation therapy.  A relocatable thermoplastic stereotactic head frame was fabricated for precise immobilization.  CT images were obtained.  Surface markings were placed.  The CT images were loaded into the planning software and fused with the patient's targeting MRI scan.  Then the target and avoidance structures were contoured.  Treatment planning then occurred.  The radiation prescription was entered and confirmed.  I have requested 3D planning  I have requested a DVH of the following structures: Brain stem, brain, left eye, right eye, lenses, optic chiasm, target volumes, uninvolved brain, and normal tissue.    SPECIAL TREATMENT PROCEDURE:  The planned course of therapy using radiation constitutes a special treatment procedure. Special care is required in the management of this patient for the following reasons. This treatment constitutes a Special Treatment Procedure for the following reason: High dose per fraction requiring special monitoring for increased toxicities of treatment including daily imaging.  The special nature of the planned course of radiotherapy will require increased physician supervision and oversight to ensure patient's safety with  optimal treatment outcomes.  PLAN:  The patient will receive 20 Gy in 1 fraction.   ------------------------------------------------  Jodelle Gross, MD, PhD

## 2018-07-19 NOTE — Progress Notes (Signed)
Thanks for taking care of him  [place]

## 2018-07-20 ENCOUNTER — Other Ambulatory Visit: Payer: Self-pay | Admitting: Family Medicine

## 2018-07-20 ENCOUNTER — Telehealth: Payer: Self-pay | Admitting: *Deleted

## 2018-07-20 ENCOUNTER — Other Ambulatory Visit: Payer: Self-pay | Admitting: *Deleted

## 2018-07-20 DIAGNOSIS — C7931 Secondary malignant neoplasm of brain: Secondary | ICD-10-CM

## 2018-07-20 NOTE — Telephone Encounter (Addendum)
Wife called to inquire if patient needs to come on 07/21/18 for Lab/Flush/OV? Procedure done yesterday per GI. OK per Dr. Benay Spice to wait till next week to see if bili comes down. She agrees to see Lattie Haw on 5/7 at 2:15 pm with lab/flush prior. Scheduling message sent.

## 2018-07-20 NOTE — Telephone Encounter (Signed)
Requesting:Marinol Contract:n/a UDS:n/a Last OV:07/10/18 Next OV:n/a Last Refill:06/05/18 #[id number] Database:   Please advise

## 2018-07-20 NOTE — Telephone Encounter (Signed)
Left message on machine to call back  

## 2018-07-21 ENCOUNTER — Inpatient Hospital Stay: Payer: BC Managed Care – PPO | Admitting: Nurse Practitioner

## 2018-07-21 ENCOUNTER — Inpatient Hospital Stay: Payer: BC Managed Care – PPO

## 2018-07-21 ENCOUNTER — Encounter (HOSPITAL_COMMUNITY): Payer: Self-pay | Admitting: Gastroenterology

## 2018-07-21 ENCOUNTER — Telehealth: Payer: Self-pay | Admitting: Oncology

## 2018-07-21 ENCOUNTER — Telehealth: Payer: Self-pay | Admitting: *Deleted

## 2018-07-21 ENCOUNTER — Telehealth: Payer: Self-pay | Admitting: Gastroenterology

## 2018-07-21 DIAGNOSIS — C7931 Secondary malignant neoplasm of brain: Secondary | ICD-10-CM | POA: Insufficient documentation

## 2018-07-21 DIAGNOSIS — Z510 Encounter for antineoplastic radiation therapy: Secondary | ICD-10-CM | POA: Insufficient documentation

## 2018-07-21 NOTE — Telephone Encounter (Signed)
Patient wife called said that the pt had a procedure on 3-29 and he has not been able to eat and has been vomiting seens.  Would like to know what to do.

## 2018-07-21 NOTE — Telephone Encounter (Signed)
Spoke with wife re 5/7 appointments, patient sleeping. Per wife patient not eating, she doesn't know when is the last time he ate but he is vomiting a lot can barely walk. Per wife she is worried and afraid and doesn't know what to do. Per wife she also called his doctor's at [facility] but has not gotten a response yet. Message routed to provider/app/desk nurse. I will also try to call. Wife aware.

## 2018-07-21 NOTE — Telephone Encounter (Signed)
Patient's wife reports that he is having vomiting with each thing he eats or drinks .  Patient is s/p ERCP on 07/19/18.  Wife reports that patient denies pain or fever.  He had vomiting and nausea prior to the ERCP.  He has not tried the zofran or the compazine he has on hand.,  She is advised to have him try this and if this fails to control the vomiting he will need ED evaluation. She verbalized understanding.

## 2018-07-21 NOTE — Telephone Encounter (Signed)
Telephone call to patient's wife. She states patient has not eaten since Monday maybe Tuesday morning. He has been nauseated and vomiting. No recent bowel movements and he is not using the restroom much at all. Patient is unable to ambulate on his own and becomes a little combative. RN strongly urged wife to call 911 or get him to the ED. She states he will resist going anywhere. Again urged her to call 911 to get him to the ED. Patients wife states she will get him to [facility] ED as soon as possible. She will call this office back if she is having any further difficulties. MD notified.

## 2018-07-22 ENCOUNTER — Other Ambulatory Visit: Payer: Self-pay | Admitting: Oncology

## 2018-07-22 DIAGNOSIS — C20 Malignant neoplasm of rectum: Secondary | ICD-10-CM

## 2018-07-22 NOTE — Telephone Encounter (Signed)
Agree with plan as indicated RN Ronnald Ramp. Patient should try to optimize his current dosing of antiemetics and if he fails this then worthwhile for further evaluation if necessary at the emergency department or urgent care.  I would suspected pancreatitis he is actually had progressive pain and even before the procedure and after I spoke with the patient as well as his wife that he has significant issues and deals with nausea and vomiting even before any procedure was performed.  I agree with oncology's assessment as well to consider ED evaluation if things persist.  Please call back on Monday to see how he is doing. DM

## 2018-07-24 NOTE — Telephone Encounter (Signed)
Spoke with pts wife and she reports pt has been able to keep some food and liquids down. He is still having some pain but she thinks he is slowly getting better. They did not go to the ER due to pt being afraid to go to the ER due to covid-19.

## 2018-07-24 NOTE — Telephone Encounter (Signed)
Left message on machine to call back will send a message via My Chart.

## 2018-07-25 ENCOUNTER — Emergency Department (HOSPITAL_COMMUNITY): Payer: BC Managed Care – PPO

## 2018-07-25 ENCOUNTER — Telehealth: Payer: Self-pay | Admitting: *Deleted

## 2018-07-25 ENCOUNTER — Telehealth: Payer: Self-pay | Admitting: Gastroenterology

## 2018-07-25 ENCOUNTER — Encounter (HOSPITAL_COMMUNITY): Payer: Self-pay | Admitting: Emergency Medicine

## 2018-07-25 ENCOUNTER — Other Ambulatory Visit (INDEPENDENT_AMBULATORY_CARE_PROVIDER_SITE_OTHER): Payer: BC Managed Care – PPO

## 2018-07-25 ENCOUNTER — Other Ambulatory Visit: Payer: Self-pay

## 2018-07-25 ENCOUNTER — Inpatient Hospital Stay (HOSPITAL_COMMUNITY): Payer: BC Managed Care – PPO

## 2018-07-25 ENCOUNTER — Other Ambulatory Visit: Payer: Self-pay | Admitting: Emergency Medicine

## 2018-07-25 ENCOUNTER — Telehealth: Payer: Self-pay | Admitting: Oncology

## 2018-07-25 ENCOUNTER — Ambulatory Visit (INDEPENDENT_AMBULATORY_CARE_PROVIDER_SITE_OTHER)
Admission: RE | Admit: 2018-07-25 | Discharge: 2018-07-25 | Disposition: A | Discharge status: Home / Self Care | Payer: BC Managed Care – PPO | Source: Ambulatory Visit | Attending: Gastroenterology | Admitting: Gastroenterology

## 2018-07-25 ENCOUNTER — Inpatient Hospital Stay (HOSPITAL_COMMUNITY)
Admission: EM | Admit: 2018-07-25 | Discharge: 2018-07-27 | DRG: 374 | Disposition: A | Discharge status: Hospice | Payer: BC Managed Care – PPO | Attending: Internal Medicine | Admitting: Internal Medicine

## 2018-07-25 DIAGNOSIS — C7802 Secondary malignant neoplasm of left lung: Secondary | ICD-10-CM | POA: Diagnosis present

## 2018-07-25 DIAGNOSIS — Z85048 Personal history of other malignant neoplasm of rectum, rectosigmoid junction, and anus: Secondary | ICD-10-CM

## 2018-07-25 DIAGNOSIS — K760 Fatty (change of) liver, not elsewhere classified: Secondary | ICD-10-CM | POA: Diagnosis present

## 2018-07-25 DIAGNOSIS — G936 Cerebral edema: Secondary | ICD-10-CM | POA: Diagnosis present

## 2018-07-25 DIAGNOSIS — R627 Adult failure to thrive: Secondary | ICD-10-CM | POA: Diagnosis present

## 2018-07-25 DIAGNOSIS — K831 Obstruction of bile duct: Secondary | ICD-10-CM | POA: Diagnosis present

## 2018-07-25 DIAGNOSIS — D696 Thrombocytopenia, unspecified: Secondary | ICD-10-CM | POA: Diagnosis not present

## 2018-07-25 DIAGNOSIS — N179 Acute kidney failure, unspecified: Secondary | ICD-10-CM | POA: Diagnosis present

## 2018-07-25 DIAGNOSIS — C7931 Secondary malignant neoplasm of brain: Secondary | ICD-10-CM | POA: Diagnosis present

## 2018-07-25 DIAGNOSIS — E871 Hypo-osmolality and hyponatremia: Secondary | ICD-10-CM | POA: Diagnosis present

## 2018-07-25 DIAGNOSIS — B370 Candidal stomatitis: Secondary | ICD-10-CM | POA: Diagnosis present

## 2018-07-25 DIAGNOSIS — C801 Malignant (primary) neoplasm, unspecified: Secondary | ICD-10-CM | POA: Diagnosis present

## 2018-07-25 DIAGNOSIS — Z66 Do not resuscitate: Secondary | ICD-10-CM | POA: Diagnosis present

## 2018-07-25 DIAGNOSIS — R64 Cachexia: Secondary | ICD-10-CM | POA: Diagnosis not present

## 2018-07-25 DIAGNOSIS — Z9049 Acquired absence of other specified parts of digestive tract: Secondary | ICD-10-CM | POA: Diagnosis not present

## 2018-07-25 DIAGNOSIS — G40909 Epilepsy, unspecified, not intractable, without status epilepticus: Secondary | ICD-10-CM | POA: Diagnosis present

## 2018-07-25 DIAGNOSIS — C20 Malignant neoplasm of rectum: Secondary | ICD-10-CM

## 2018-07-25 DIAGNOSIS — C229 Malignant neoplasm of liver, not specified as primary or secondary: Secondary | ICD-10-CM | POA: Diagnosis not present

## 2018-07-25 DIAGNOSIS — C7971 Secondary malignant neoplasm of right adrenal gland: Secondary | ICD-10-CM | POA: Diagnosis present

## 2018-07-25 DIAGNOSIS — Z20828 Contact with and (suspected) exposure to other viral communicable diseases: Secondary | ICD-10-CM | POA: Diagnosis present

## 2018-07-25 DIAGNOSIS — C772 Secondary and unspecified malignant neoplasm of intra-abdominal lymph nodes: Secondary | ICD-10-CM | POA: Diagnosis present

## 2018-07-25 DIAGNOSIS — D61818 Other pancytopenia: Secondary | ICD-10-CM | POA: Diagnosis present

## 2018-07-25 DIAGNOSIS — C7800 Secondary malignant neoplasm of unspecified lung: Secondary | ICD-10-CM | POA: Diagnosis not present

## 2018-07-25 DIAGNOSIS — Z923 Personal history of irradiation: Secondary | ICD-10-CM | POA: Diagnosis not present

## 2018-07-25 DIAGNOSIS — C787 Secondary malignant neoplasm of liver and intrahepatic bile duct: Secondary | ICD-10-CM | POA: Diagnosis present

## 2018-07-25 DIAGNOSIS — R112 Nausea with vomiting, unspecified: Secondary | ICD-10-CM

## 2018-07-25 DIAGNOSIS — Z9689 Presence of other specified functional implants: Secondary | ICD-10-CM | POA: Diagnosis not present

## 2018-07-25 DIAGNOSIS — Z933 Colostomy status: Secondary | ICD-10-CM | POA: Diagnosis not present

## 2018-07-25 DIAGNOSIS — E860 Dehydration: Secondary | ICD-10-CM | POA: Diagnosis present

## 2018-07-25 DIAGNOSIS — C7801 Secondary malignant neoplasm of right lung: Secondary | ICD-10-CM | POA: Diagnosis present

## 2018-07-25 DIAGNOSIS — Z515 Encounter for palliative care: Secondary | ICD-10-CM | POA: Diagnosis not present

## 2018-07-25 LAB — URINALYSIS, ROUTINE W REFLEX MICROSCOPIC
Glucose, UA: NEGATIVE mg/dL
Hgb urine dipstick: NEGATIVE
Ketones, ur: NEGATIVE mg/dL
Leukocytes,Ua: NEGATIVE
Nitrite: NEGATIVE
Protein, ur: NEGATIVE mg/dL
Specific Gravity, Urine: 1.016 (ref 1.005–1.030)
pH: 5 (ref 5.0–8.0)

## 2018-07-25 LAB — CBC WITH DIFFERENTIAL/PLATELET
Abs Immature Granulocytes: 0.2 10*3/uL — ABNORMAL HIGH (ref 0.00–0.07)
Basophils Absolute: 0 10*3/uL (ref 0.0–0.1)
Basophils Relative: 1 %
Eosinophils Absolute: 0 10*3/uL (ref 0.0–0.5)
Eosinophils Relative: 0 %
HCT: 36.9 % — ABNORMAL LOW (ref 39.0–52.0)
Hemoglobin: 12.1 g/dL — ABNORMAL LOW (ref 13.0–17.0)
Immature Granulocytes: 7 %
Lymphocytes Relative: 4 %
Lymphs Abs: 0.1 10*3/uL — ABNORMAL LOW (ref 0.7–4.0)
MCH: 29.7 pg (ref 26.0–34.0)
MCHC: 32.8 g/dL (ref 30.0–36.0)
MCV: 90.7 fL (ref 80.0–100.0)
Monocytes Absolute: 0.7 10*3/uL (ref 0.1–1.0)
Monocytes Relative: 22 %
Neutro Abs: 2 10*3/uL (ref 1.7–7.7)
Neutrophils Relative %: 66 %
Platelets: 90 10*3/uL — ABNORMAL LOW (ref 150–400)
RBC: 4.07 MIL/uL — ABNORMAL LOW (ref 4.22–5.81)
RDW: 31.4 % — ABNORMAL HIGH (ref 11.5–15.5)
WBC: 3 10*3/uL — ABNORMAL LOW (ref 4.0–10.5)
nRBC: 0 % (ref 0.0–0.2)

## 2018-07-25 LAB — BASIC METABOLIC PANEL
BUN: 47 mg/dL — ABNORMAL HIGH (ref 6–23)
CO2: 26 mEq/L (ref 19–32)
Calcium: 8.7 mg/dL (ref 8.4–10.5)
Chloride: 93 mEq/L — ABNORMAL LOW (ref 96–112)
Creatinine, Ser: 2.12 mg/dL — ABNORMAL HIGH (ref 0.40–1.50)
GFR: 33.57 mL/min — ABNORMAL LOW (ref >60.00)
Glucose, Bld: 98 mg/dL (ref 70–99)
Potassium: 3.9 mEq/L (ref 3.5–5.1)
Sodium: 131 mEq/L — ABNORMAL LOW (ref 135–145)

## 2018-07-25 LAB — CALCIUM: Calcium: 8.7 mg/dL (ref 8.4–10.5)

## 2018-07-25 LAB — HEPATIC FUNCTION PANEL
ALT: 46 U/L (ref 0–53)
AST: 101 U/L — ABNORMAL HIGH (ref 0–37)
Albumin: 3 g/dL — ABNORMAL LOW (ref 3.5–5.2)
Alkaline Phosphatase: 407 U/L — ABNORMAL HIGH (ref 39–117)
Bilirubin, Direct: 18.1 mg/dL — ABNORMAL HIGH (ref 0.0–0.3)
Total Bilirubin: 37.9 mg/dL — ABNORMAL HIGH (ref 0.2–1.2)
Total Protein: 4.9 g/dL — ABNORMAL LOW (ref 6.0–8.3)

## 2018-07-25 LAB — PROTIME-INR
INR: 2 — ABNORMAL HIGH (ref 0.8–1.2)
Prothrombin Time: 22.4 seconds — ABNORMAL HIGH (ref 11.4–15.2)

## 2018-07-25 LAB — LACTIC ACID, PLASMA
Lactic Acid, Venous: 1.5 mmol/L (ref 0.5–1.9)
Lactic Acid, Venous: 1.7 mmol/L (ref 0.5–1.9)

## 2018-07-25 LAB — SARS CORONAVIRUS 2 BY RT PCR (HOSPITAL ORDER, PERFORMED IN ~~LOC~~ HOSPITAL LAB): SARS Coronavirus 2: NEGATIVE

## 2018-07-25 LAB — LIPASE, BLOOD: Lipase: 48 U/L (ref 11–51)

## 2018-07-25 MED ORDER — SODIUM CHLORIDE 0.9 % IV BOLUS
1000.0000 mL | Freq: Once | INTRAVENOUS | Status: AC
  Administered 2018-07-25: 20:00:00 1000 mL via INTRAVENOUS

## 2018-07-25 MED ORDER — SENNOSIDES-DOCUSATE SODIUM 8.6-50 MG PO TABS
1.0000 | ORAL_TABLET | Freq: Two times a day (BID) | ORAL | Status: DC
  Administered 2018-07-25 – 2018-07-27 (×4): 1 via ORAL
  Filled 2018-07-25 (×4): qty 1

## 2018-07-25 MED ORDER — HYDROMORPHONE HCL 1 MG/ML IJ SOLN
0.5000 mg | INTRAMUSCULAR | Status: DC | PRN
  Filled 2018-07-25: qty 1

## 2018-07-25 MED ORDER — LAMOTRIGINE 100 MG PO TABS
100.0000 mg | ORAL_TABLET | Freq: Two times a day (BID) | ORAL | Status: DC
  Administered 2018-07-25 – 2018-07-27 (×4): 100 mg via ORAL
  Filled 2018-07-25 (×4): qty 1

## 2018-07-25 MED ORDER — CIPROFLOXACIN IN D5W 400 MG/200ML IV SOLN
400.0000 mg | Freq: Once | INTRAVENOUS | Status: AC
  Administered 2018-07-25: 400 mg via INTRAVENOUS
  Filled 2018-07-25: qty 200

## 2018-07-25 MED ORDER — LACTATED RINGERS IV SOLN
INTRAVENOUS | Status: AC
  Administered 2018-07-25: 23:00:00 via INTRAVENOUS

## 2018-07-25 MED ORDER — ONDANSETRON HCL 4 MG PO TABS: 4.0000 mg | ORAL_TABLET | Freq: Four times a day (QID) | ORAL | Status: DC | PRN

## 2018-07-25 MED ORDER — ONDANSETRON HCL 4 MG/2ML IJ SOLN: 4.0000 mg | Freq: Four times a day (QID) | INTRAMUSCULAR | Status: DC | PRN

## 2018-07-25 MED ORDER — ALBUTEROL SULFATE HFA 108 (90 BASE) MCG/ACT IN AERS
2.0000 | INHALATION_SPRAY | Freq: Four times a day (QID) | RESPIRATORY_TRACT | Status: DC | PRN
  Filled 2018-07-25: qty 6.7

## 2018-07-25 MED ORDER — CLOTRIMAZOLE 10 MG MT TROC
10.0000 mg | Freq: Four times a day (QID) | OROMUCOSAL | Status: DC | PRN
  Filled 2018-07-25: qty 1

## 2018-07-25 MED ORDER — PANTOPRAZOLE SODIUM 40 MG PO TBEC
40.0000 mg | DELAYED_RELEASE_TABLET | Freq: Every day | ORAL | Status: DC
  Administered 2018-07-26 – 2018-07-27 (×2): 40 mg via ORAL
  Filled 2018-07-25 (×2): qty 1

## 2018-07-25 MED ORDER — CIPROFLOXACIN IN D5W 400 MG/200ML IV SOLN
400.0000 mg | Freq: Two times a day (BID) | INTRAVENOUS | Status: DC
  Administered 2018-07-26 – 2018-07-27 (×3): 400 mg via INTRAVENOUS
  Filled 2018-07-25 (×3): qty 200

## 2018-07-25 MED ORDER — ONDANSETRON HCL 4 MG/2ML IJ SOLN
4.0000 mg | Freq: Once | INTRAMUSCULAR | Status: AC
  Administered 2018-07-25: 20:00:00 4 mg via INTRAVENOUS
  Filled 2018-07-25: qty 2

## 2018-07-25 MED ORDER — OXYCODONE HCL 5 MG PO TABS: 5.0000 mg | ORAL_TABLET | ORAL | Status: DC | PRN

## 2018-07-25 MED ORDER — DEXAMETHASONE 0.5 MG PO TABS
1.0000 mg | ORAL_TABLET | Freq: Every day | ORAL | Status: DC
  Administered 2018-07-26 – 2018-07-27 (×2): 1 mg via ORAL
  Filled 2018-07-25 (×2): qty 2

## 2018-07-25 MED ORDER — IOHEXOL 300 MG/ML  SOLN
30.0000 mL | Freq: Once | INTRAMUSCULAR | Status: AC | PRN
  Administered 2018-07-25: 20:00:00 30 mL via ORAL

## 2018-07-25 MED ORDER — DRONABINOL 2.5 MG PO CAPS: 5.0000 mg | ORAL_CAPSULE | Freq: Three times a day (TID) | ORAL | Status: DC | PRN

## 2018-07-25 MED ORDER — SENNOSIDES-DOCUSATE SODIUM 8.6-50 MG PO TABS: 1.0000 | ORAL_TABLET | Freq: Every evening | ORAL | Status: DC | PRN

## 2018-07-25 NOTE — ED Notes (Signed)
Pt vomited unknown amount due to pt vomiting in the floor. Vomit appeared to be clear in color.

## 2018-07-25 NOTE — ED Provider Notes (Signed)
[facility] DEPT Provider Note   CSN: [id number] Arrival date & time: 07/25/18  1706    History   Chief Complaint Chief Complaint  Patient presents with   Abnormal Lab   cancer patient    HPI Jacob [name] is a 48 y.o. 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  Patient's followed by Dr. Rush Landmark with a [facility] and Dr. Benay Spice with oncology. Pt reports having worsening symptoms of nausea with vomiting for multiple weeks with fatigue and decreased appetite. He reports intermittent chills.  He also reports decreased stool output in his ostomy bag.  Pt denies any new or worsening abdominal pain. He denies fever, though reports a cough that is dry. No sick contacts.  Patient states he is not currently undergoing chemotherapy treatment, however he just recently finished a course.  He had labs drawn this morning by his GI specialist, which revealed an acute renal injury and elevated t bili 38. Concern for obstructive process and pt recommended for admission for IV fluids, IV antibiotics, CT imaging for further evaluation.       The history is provided by the patient and medical records.    Past Medical History:  Diagnosis Date   Allergic state 01/19/2012   Anemia    Cancer ([place]) 03/06/12   rectal bx=Adenocarcinoma  PT HAD RADIATION , CHEMO SURGERY   Cancer of brain ([place])    radiation   Cancer of lung ([place])    Chicken pox as a child   ?   Dysrhythmia as child   HX OF IRREGULAR HB AT TIME OF TONSILLECTOMY - SURGERY WAS NOT DONE-CAN'T REMEMBER ANY OTHER DETAILS- NEVER HAD THE SURGERY.   Elevated liver function tests 01/19/2012   Fatty liver    Hernia 6 months old   History of diarrhea    better since chemo finished   Hx of migraines    Lung abnormality 2015   Lung nodule 08/2013    Lung nodule, multiple 11/29/2013   Memory loss    Migraine headache as a child   Numbness    TOES OF BOTH FEET.   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 ([place]) 07/25/2018   AKI (acute kidney injury) ([place]) 07/25/2018   Jaundice 07/10/2018   Nausea and vomiting 07/10/2018   Secondary malignant neoplasm of left lung ([place]) 04/25/2018   Decreased visual acuity 03/29/2018   Hemianopia, homonymous, right 03/13/2018   Port-A-Cath in place 02/22/2018   Vocal cord paralysis 02/10/2018   Constipation 12/19/2017   Anorexia 11/13/2017   Overactive bladder 11/07/2017   Insomnia 11/07/2017   Hyponatremia 10/05/2017   Cough 10/05/2017   Focal seizures ([place]) 09/27/2017   Brain metastasis ([place]) 08/17/2017   Metastasis to adrenal gland ([place])  05/05/2017   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 08/21/2016   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   Port catheter in place 08/28/2015   Leukopenia    Thrombocytopenia ([facility])    Lung nodule, multiple 11/29/2013   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[place]) 09/17/2013   Peripheral neuropathy, 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   Anemia 12/31/2012   Grief reaction 06/18/2012   Malignant neoplasm of rectum ([place]) 03/17/2012   Elevated liver function tests 01/19/2012   Elevated BP 01/19/2012   Sun-damaged skin 01/19/2012    Past Surgical History:  Procedure Laterality  Date   BILIARY DILATION  07/19/2018   Procedure: BILIARY DILATION;  Surgeon: Irving Copas., MD;  Location: [facility];  Service: Gastroenterology;;   BILIARY STENT PLACEMENT N/A 07/19/2018   Procedure: BILIARY STENT PLACEMENT;  Surgeon: Irving Copas., MD;  Location: [facility]OSCOPY;  Service: Gastroenterology;  Laterality: N/A;   CHOLECYSTECTOMY N/A 09/17/2013   Procedure: CHOLECYSTECTOMY;  Surgeon: Stark Klein, MD;  Location: [facility];  Service: General;  Laterality: N/A;   COLOSTOMY TAKEDOWN N/A 08/17/2016   Procedure: LAPAROSCOPIC ABDOMINOPERINEAL RESECTION WITH PERMANENT COLOSTOMY;  Surgeon: Leighton Ruff, MD;  Location: [facility];  Service: General;  Laterality: N/A;   CYST EXCISION     L EAR AREA   ENDOSCOPIC RETROGRADE CHOLANGIOPANCREATOGRAPHY (ERCP) WITH PROPOFOL N/A 07/19/2018   Procedure: ENDOSCOPIC RETROGRADE CHOLANGIOPANCREATOGRAPHY (ERCP) WITH PROPOFOL;  Surgeon: Irving Copas., MD;  Location: [facility];  Service: Gastroenterology;  Laterality: N/A;   EUS  03/14/2012   Procedure: LOWER ENDOSCOPIC ULTRASOUND (EUS);  Surgeon: Milus Banister, MD;  Location: [facility];  Service: Endoscopy;  Laterality: N/A;   HERNIA REPAIR  6 months old   right inguinal repair   ILEOSTOMY CLOSURE N/A 12/07/2012   Procedure: ILEOSTOMY TAKEDOWN;  Surgeon: Leighton Ruff, MD;  [facility]: [facility];  Service: General;  Laterality: N/A;   LAPAROSCOPIC LOW ANTERIOR RESECTION N/A 06/29/2012   Procedure: LAPAROSCOPIC LOW ANTERIOR RESECTION, mobilization splenic flexure,coloanal anastomosis,diverting ileostomy;  Surgeon: Leighton Ruff, MD;  [facility];  Service: General;  Laterality: N/A;   LAPAROSCOPY N/A 09/17/2013   Procedure: LAPAROSCOPY DIAGNOSTIC;  Surgeon: Stark Klein, MD;  Location: [facility];  Service: General;  Laterality: N/A;   LARYNGOPLASTY Left 02/10/2018   Procedure: LARYNGOPLASTY;  Surgeon: Melissa Montane, MD;  Location: [facility];  Service: ENT;  Laterality: Left;     LIVER ULTRASOUND N/A 09/17/2013   Procedure: LIVER ULTRASOUND;  Surgeon: Stark Klein, MD;  Location: [facility];  Service: General;  Laterality: N/A;   MICROLARYNGOSCOPY W/VOCAL CORD INJECTION Left 05/16/2017   Procedure: MICROLARYNGOSCOPY WITH VOCAL CORD INJECTION;  Surgeon: Melissa Montane, MD;  Location: [facility];  Service: ENT;  Laterality: Left;   OPEN HEPATECTOMY  N/A 09/17/2013   Procedure: OPEN HEPATECTOMY;  Surgeon: Stark Klein, MD;  Location: [facility];  Service: General;  Laterality: N/A;   PANCREATIC STENT PLACEMENT  07/19/2018   Procedure: PANCREATIC STENT PLACEMENT;  Surgeon: Irving Copas., MD;  Location: [facility];  Service: Gastroenterology;;   PORTACATH PLACEMENT Left 03/21/2013   Procedure: INSERTION PORT-A-CATH;  Surgeon: Leighton Ruff, MD;  [facility];  Service: General;  Laterality: Left;   RECTAL BIOPSY  03/06/12   distal mass1-2cm from anal verge=Adenocarcinoma   REMOVAL OF STONES  07/19/2018   Procedure: REMOVAL OF STONES;  Surgeon: Irving Copas., MD;  Location: [facility];  Service: Gastroenterology;;   Joan Mayans  07/19/2018   Procedure: Joan Mayans;  Surgeon: Irving Copas., MD;  Location: [facility];  Service: Gastroenterology;;   TONSILLECTOMY          Home Medications    Prior to Admission medications   Medication Sig Start Date End Date Taking? Authorizing Provider  albuterol (VENTOLIN HFA) 108 (90 Base) MCG/ACT inhaler Inhale 2 puffs into the lungs every 6 (six) hours as needed for wheezing or shortness of breath. 07/10/18  Yes Mosie Lukes, MD  clotrimazole (MYCELEX) 10 MG troche Take 1 lozenge (10 mg total) by mouth 4 (four) times daily. While on decadron Patient taking differently: Take 10 mg by mouth 4 (four) times daily as needed (oral thrush).  04/17/18  Yes Ladell Pier, MD  dexamethasone (DECADRON) 1 MG tablet Take 2 tablets (2 mg total) by mouth daily. Patient taking differently:  Take 1 mg by mouth daily.  05/08/18  Yes Vaslow, Acey Lav, MD  docusate sodium (STOOL SOFTENER) 100 MG capsule Take 100-200 mg by mouth daily as needed for mild constipation.    Yes [provider]  dronabinol (MARINOL) 5 MG capsule Take 1 capsule by mouth three times daily as needed Patient taking differently: Take 5 mg by mouth 3 (three) times daily as needed (nausea).  07/20/18  Yes Mosie Lukes, MD  fluticasone (FLOVENT HFA) 110 MCG/ACT inhaler Inhale 2 puffs into the lungs 2 (two) times daily. 03/27/18  Yes Mosie Lukes, MD  furosemide (LASIX) 20 MG tablet Take 1 tablet (20 mg total) by mouth daily. 06/30/18  Yes Ladell Pier, MD  Guaifenesin 1200 MG TB12 Take 1,200 mg by mouth 2 (two) times a day.   Yes [provider]  lamoTRIgine (LAMICTAL) 100 MG tablet Take 1 tablet (100 mg total) by mouth 2 (two) times daily. 06/30/18  Yes Ladell Pier, MD  lidocaine-prilocaine (EMLA) cream Apply 1 application topically as needed (prior to port access). 02/13/18  Yes Owens Shark, NP  omeprazole (PRILOSEC) 40 MG capsule Take 1 capsule (40 mg total) by mouth 2 (two) times a day for 30 days. 07/19/18 08/18/18 Yes Mansouraty, Telford Nab., MD  prochlorperazine (COMPAZINE) 10 MG tablet Take 1 tablet (10 mg total) by mouth every 6 (six) hours as needed for nausea or vomiting. 06/30/18  Yes Ladell Pier, MD  sennosides-docusate sodium (SENOKOT-S) 8.6-50 MG tablet Take 1 tablet by mouth 2 (two) times daily as needed for constipation.    Yes [provider]  ondansetron (ZOFRAN) 4 MG tablet Take 1 tablet (4 mg total) by mouth every 8 (eight) hours as needed for nausea or vomiting. Patient not taking: Reported on 07/25/2018 07/10/18   Mosie Lukes, MD  tiZANidine (ZANAFLEX) 4 MG tablet TAKE 1/2 TO 1 (ONE-HALF TO ONE) TABLET BY MOUTH EVERY 8 HOURS AS NEEDED FOR MUSCLE SPASM Patient not taking: No sig reported 05/04/18   Mosie Lukes, MD  trifluridine-tipiracil (LONSURF)  15-6.14 MG tablet Take 3 tablets (45mg  trifluridine) by mouth 2 times daily, within 1 hr of food on days 1-5 and 8-12 of each 28 day cycle Patient not taking: Reported on 07/25/2018 07/03/18   Ladell Pier, MD    Family History Family History  Adopted: Yes  Problem Relation Age of Onset   Cancer Father    Heart disease Father    Colon cancer Neg Hx     Social History Social History   Tobacco Use   Smoking status: Never Smoker   Smokeless tobacco: Never Used  Substance Use Topics   Alcohol use: Not  Currently    Alcohol/week: 0.0 standard drinks    Comment: RARE    Drug use: No    Comment: marijuana past     Allergies   Alprazolam and Penicillins   Review of Systems Review of Systems  Constitutional: Positive for appetite change and chills. Negative for fever.  Respiratory: Positive for cough. Negative for shortness of breath.   Gastrointestinal: Positive for nausea and vomiting. Negative for abdominal pain.  Genitourinary: Negative for dysuria and frequency.  Skin: Positive for color change.  Allergic/Immunologic: Positive for immunocompromised state.  All other systems reviewed and are negative.    Physical Exam Updated Vital Signs BP 111/69    Pulse 79    Temp 98 F (36.7 C) (Oral)    Resp 15    SpO2 98%   Physical Exam Vitals signs and nursing note reviewed.  Constitutional:      Appearance: He is well-developed.     Comments: Thin appearing male.  Chronically ill-appearing.  HENT:     Head: Normocephalic and atraumatic.  Eyes:     General: Scleral icterus present.     Conjunctiva/sclera: Conjunctivae normal.  Cardiovascular:     Rate and Rhythm: Normal rate and regular rhythm.  Pulmonary:     Effort: Pulmonary effort is normal. No respiratory distress.     Breath sounds: Rhonchi present.  Abdominal:     General: A surgical scar is present. Bowel sounds are normal.     Palpations: Abdomen is soft.     Hernia: A hernia is present. Hernia is  present in the umbilical area.     Comments: Very slight abdominal distention noted.  Ostomy bag present in the left lower quadrant.  No tenderness with palpation of the abdomen.  No guarding or rebound.  Musculoskeletal:     Comments: Significant bilateral lower leg pitting edema.  Skin:    General: Skin is warm.     Coloration: Skin is jaundiced.  Neurological:     Mental Status: He is alert.  Psychiatric:        Mood and Affect: Mood normal.        Behavior: Behavior normal.      ED Treatments / Results  Labs (all labs ordered are listed, but only abnormal results are displayed) Labs Reviewed  CBC WITH DIFFERENTIAL/PLATELET - Abnormal; Notable for the following components:      Result Value   WBC 3.0 (*)    RBC 4.07 (*)    Hemoglobin 12.1 (*)    HCT 36.9 (*)    RDW 31.4 (*)    Platelets 90 (*)    Lymphs Abs 0.1 (*)    Abs Immature Granulocytes 0.20 (*)    All other components within normal limits  URINALYSIS, ROUTINE W REFLEX MICROSCOPIC - Abnormal; Notable for the following components:   Color, Urine AMBER (*)    Bilirubin Urine MODERATE (*)    All other components within normal limits  PROTIME-INR - Abnormal; Notable for the following components:   Prothrombin Time 22.4 (*)    INR 2.0 (*)    All other components within normal limits  SARS CORONAVIRUS 2 (HOSPITAL ORDER, [facility])  CULTURE, BLOOD (ROUTINE X 2)  CULTURE, BLOOD (ROUTINE X 2)  LIPASE, BLOOD  LACTIC ACID, PLASMA  LACTIC ACID, PLASMA    EKG None  Radiology Dg Abd 1 View  Result Date: 07/25/2018 CLINICAL DATA:  Right upper quadrant pain since ERCP 07/19/2018. EXAM: ABDOMEN - 1  VIEW COMPARISON:  Images from ERCP 07/19/2018. FINDINGS: Common bile duct and pancreatic stents are in place. The patient is status post cholecystectomy. Left lower quadrant ostomy is identified. The bowel gas pattern is normal. No radio-opaque calculi or other significant radiographic abnormality  are seen. IMPRESSION: No acute abnormality. Electronically Signed   By: Inge Rise M.D.   On: 07/25/2018 16:38   Dg Chest Port 1 View  Result Date: 07/25/2018 CLINICAL DATA:  Cough EXAM: PORTABLE CHEST 1 VIEW COMPARISON:  Chest x-ray dated 07/19/2018. CT chest dated 04/19/2018 FINDINGS: A left subclavian Port-A-Cath is again noted. This is well positioned. The right lung field is mostly clear with a few scattered opacities. Persistent elevation of the left hemidiaphragm with left-sided volume loss is noted. Hazy airspace opacities are again noted throughout the left lung field. There may be a more focal opacity medial to the Port-A-Cath. No pneumothorax. IMPRESSION: 1. Grossly stable appearance of the chest with diffuse hazy airspace opacities throughout the left lung field with mild to moderate left-sided volume loss. 2. Stable left subclavian Port-A-Cath. Electronically Signed   By: Constance Holster M.D.   On: 07/25/2018 19:36    Procedures Procedures (including critical care time)  Medications Ordered in ED Medications  sodium chloride 0.9 % bolus 1,000 mL (0 mLs Intravenous Stopped 07/25/18 2142)  ondansetron (ZOFRAN) injection 4 mg (4 mg Intravenous Given 07/25/18 1944)  ciprofloxacin (CIPRO) IVPB 400 mg (0 mg Intravenous Stopped 07/25/18 2143)     Initial Impression / Assessment and Plan / ED Course  I have reviewed the triage vital signs and the nursing notes.  Pertinent labs & imaging results that were available during my care of the patient were reviewed by me and considered in my medical decision making (see chart for details).  Clinical Course as of Jul 25 2207  Tue Jul 25, 2018  2004 Dr. Carlean Purl with [facility]. Recommends IV Cipro. NPO midnight. Plan for stent removal, will evaluate in the morning for additional plan.   [JR]  2113 Dr. Posey Pronto accepting admission.   [JR]    Clinical Course User Index [JR] Ibraham Levi, [place] N, PA-C       Patient sent to the ED per GI  specialist, Dr. Rush Landmark, for acute renal failure in the setting of dehydration from nausea and vomiting.  Also concern for worsening obstructive process, and patient's elevating bilirubin.  On exam, patient is chronically ill-appearing, jaundiced.  No significant abdominal tenderness on exam.  Pt does not meet sepsis criteria. IV fluids and antiemetics initiated.  Patient was discussed with Dr. Carlean Purl, colleagues with Dr. Rush Landmark, who recommends IV ciprofloxacin for coverage of possible intra-abdominal infection versus abscess as cause of obstructive pathology.  He states patient will likely have stent removal tomorrow and recommends n.p.o. at midnight.  Agrees with noncontrast CT scan for further evaluation.  Patient will be admitted for IV fluids for acute kidney injury and further management by his GI specialist and oncologist.  Dr. Posey Pronto accepting admission.  Patient was discussed with Dr. Kathrynn Humble.  The patient appears reasonably stabilized for admission considering the current resources, flow, and capabilities available in the ED at this time, and I doubt any other [facility] requiring further screening and/or treatment in the ED prior to admission.  Final Clinical Impressions(s) / ED Diagnoses   Final diagnoses:  Nausea and vomiting, intractability of vomiting not specified, unspecified vomiting type  Acute kidney injury ([place])  History of rectal cancer  Hyperbilirubinemia    ED Discharge Orders  None       Deah Ottaway, [place] N, PA-C 07/25/18 2209    Varney Biles, MD 07/26/18 [phone]

## 2018-07-25 NOTE — ED Triage Notes (Signed)
Pt was advised to go to Ed for IV fluids and further evaluation since bilirubin came back high today and having kidney issues as well.

## 2018-07-25 NOTE — H&P (Signed)
History and Physical    Jacob [name] OEU:[id number] DOB: 04/29/1970 DOA: 07/25/2018  PCP: Mosie Lukes, MD  Patient coming from: Home  I have personally briefly reviewed patient's old medical records in [place]  Chief Complaint: Abdominal pain, nausea, vomiting, generalized weakness  HPI: Jacob [name] is a 48 y.o. 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   Patient underwent ERCP on 07/19/2018 by Dr. Rush Landmark which showed a single localized severe malignant appearing biliary stricture.  A biliary sphincterotomy was performed in the stenotic region was dilated.  A plastic biliary stent was placed into the right hepatic duct and 1 temporary plastic pancreatic stent was placed into the ventral pancreatic duct to decrease risk of post ERCP pancreatitis.  A nonbleeding duodenal ulcer with clean base was seen proximal to the ampulla.  Patient had continued symptoms as above and had follow-up lab work performed by his gastroenterologist this afternoon.  Labs are notable for worsening hyperbilirubinemia of 37.9, 18.1 direct bilirubin, alk phos 407, AST 101, ALT 46.  He was also noted to have worsening renal function with BUN 47, creatinine 2.12, GFR 33.57 compared to normal renal function 12 days ago.  He says he is having significant functional decline with poor oral intake and decreased colostomy output.  He has had swelling in both of his legs but reports decent urine output.  He has also been having dyspnea on exertion.  He noticed right peripheral vision loss several weeks ago.  He had MRI brain with and without contrast on 07/12/2018 which showed a new 8 mm metastasis in the  right middle cerebellar peduncle and mildly increased size of all other lesions including a 5 cm left occipital lesion with worsening of extensive edema.  He saw his radiation oncologist on 07/17/2018 recommend continuing dexamethasone 1 mg daily as well as Lamictal for seizures.  He was recommended to have radiosurgery for the new lesion for therapeutic and palliative affect.  He currently denies any chest pain, subjective fevers, chills, diaphoresis, diarrhea, dysuria.  ED Course:  Initial vitals showed BP 118/74, pulse 90, RR 18, temp 98.0 Fahrenheit, SPO2 97% on room air.  Labs are notable for WBC 3.0, hemoglobin 12.1, platelets 90,000, lipase 48, INR 2.0, lactic acid 1.5.  SARS-CoV-2 testing was negative.  Blood cultures were drawn and pending.  Urinalysis was negative for UTI.  Patient was given 1 L normal saline.  The EDP discussed the case with on-call lower GI, Dr. Carlean Purl, who recommended IV ciprofloxacin and make n.p.o. at midnight for likely stent removal tomorrow.  Ciprofloxacin was started and a CT abdomen/pelvis without contrast was obtained which showed biliary and pancreatic stents in place without evidence of biliary ductal dilatation.  Bilateral pulmonary metastasis in the lung bases reported stable with small left pleural effusion.  Metastases in the liver and right adrenal gland not significantly changed from prior as well as retroperitoneal adenopathy which is stable.  Free fluid in the pelvis and adjacent to the liver and spleen was seen and stable per radiology report.  The hospital service was consulted to admit for further evaluation management.  Review of Systems: As per HPI otherwise 10 point review of systems negative.    Past Medical History:  Diagnosis Date   Allergic state 01/19/2012   Anemia    Cancer ([place]) 03/06/12   rectal bx=Adenocarcinoma  PT HAD RADIATION , CHEMO SURGERY   Cancer of brain ([place])    radiation   Cancer of lung ([place])    Chicken pox as  a child   ?   Dysrhythmia as child   HX OF IRREGULAR HB AT TIME OF TONSILLECTOMY - SURGERY WAS NOT DONE-CAN'T REMEMBER ANY OTHER DETAILS- NEVER HAD THE SURGERY.   Elevated liver function tests 01/19/2012   Fatty liver    Hernia 6 months old   History of diarrhea    better since chemo finished   Hx of migraines    Lung abnormality 2015   Lung nodule 08/2013   Lung nodule, multiple 11/29/2013   Memory loss    Migraine headache as a child   Numbness    TOES OF BOTH FEET.   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   mostly in feet   Preventative health care 01/19/2012   Radiation 03/20/12-04/27/12   Pelvis 50.4 gray Rectal cancer   Rectal cancer ([place]) 03/06/12   biopsy-adenocarcinoma   Reflux 01/19/2012   Seizure ([place])    last one 2019   Sun-damaged skin 01/19/2012    Past Surgical History:  Procedure Laterality Date   BILIARY DILATION  07/19/2018   Procedure: BILIARY DILATION;  Surgeon: Irving Copas., MD;  Location: [facility];  Service: Gastroenterology;;   BILIARY STENT PLACEMENT N/A 07/19/2018   Procedure: BILIARY STENT PLACEMENT;  Surgeon: Irving Copas., MD;  Location: [facility];  Service: Gastroenterology;  Laterality: N/A;   CHOLECYSTECTOMY N/A 09/17/2013   Procedure: CHOLECYSTECTOMY;  Surgeon: Stark Klein, MD;  Location: [facility];  Service: General;  Laterality: N/A;   COLOSTOMY TAKEDOWN N/A 08/17/2016   Procedure: LAPAROSCOPIC ABDOMINOPERINEAL RESECTION WITH PERMANENT COLOSTOMY;  Surgeon: Leighton Ruff, MD;  Location[facility];  Service: General;  Laterality: N/A;   CYST EXCISION     L EAR AREA   ENDOSCOPIC RETROGRADE CHOLANGIOPANCREATOGRAPHY (ERCP) WITH PROPOFOL N/A 07/19/2018   Procedure: ENDOSCOPIC RETROGRADE  CHOLANGIOPANCREATOGRAPHY (ERCP) WITH PROPOFOL;  Surgeon: Irving Copas., MD;  Location: [facility];  Service: Gastroenterology;  Laterality: N/A;   EUS  03/14/2012   Procedure: LOWER ENDOSCOPIC ULTRASOUND (EUS);  Surgeon: Milus Banister, MD;  Location[facility];  Service: Endoscopy;  Laterality: N/A;   HERNIA REPAIR  6 months old   right inguinal repair   ILEOSTOMY CLOSURE N/A 12/07/2012   Procedure: ILEOSTOMY TAKEDOWN;  Surgeon: Leighton Ruff, MD;  [facility];  Service: General;  Laterality: N/A;   LAPAROSCOPIC LOW ANTERIOR RESECTION N/A 06/29/2012   Procedure: LAPAROSCOPIC LOW ANTERIOR RESECTION, mobilization splenic flexure,coloanal anastomosis,diverting ileostomy;  Surgeon: Leighton Ruff, MD;  [facility];  Service: General;  Laterality: N/A;   LAPAROSCOPY N/A 09/17/2013   Procedure: LAPAROSCOPY DIAGNOSTIC;  Surgeon: Stark Klein, MD;  Location: [facility];  Service: General;  Laterality: N/A;   LARYNGOPLASTY Left 02/10/2018   Procedure: LARYNGOPLASTY;  Surgeon: Melissa Montane, MD;  Location: [facility];  Service: ENT;  Laterality: Left;   LIVER ULTRASOUND N/A 09/17/2013   Procedure: LIVER ULTRASOUND;  Surgeon: Stark Klein, MD;  Location: [facility];  Service: General;  Laterality: N/A;   MICROLARYNGOSCOPY W/VOCAL CORD INJECTION  Left 05/16/2017   Procedure: MICROLARYNGOSCOPY WITH VOCAL CORD INJECTION;  Surgeon: Melissa Montane, MD;  Location: [facility];  Service: ENT;  Laterality: Left;   OPEN HEPATECTOMY  N/A 09/17/2013   Procedure: OPEN HEPATECTOMY;  Surgeon: Stark Klein, MD;  Location: [facility];  Service: General;  Laterality: N/A;   PANCREATIC STENT PLACEMENT  07/19/2018   Procedure: PANCREATIC STENT PLACEMENT;  Surgeon: Irving Copas., MD;  Location: [facility];  Service: Gastroenterology;;   PORTACATH PLACEMENT Left 03/21/2013   Procedure: INSERTION PORT-A-CATH;  Surgeon: Leighton Ruff, MD;  [facility];  Service: General;  Laterality: Left;    RECTAL BIOPSY  03/06/12   distal mass1-2cm from anal verge=Adenocarcinoma   REMOVAL OF STONES  07/19/2018   Procedure: REMOVAL OF STONES;  Surgeon: Irving Copas., MD;  Location: [facility];  Service: Gastroenterology;;   Joan Mayans  07/19/2018   Procedure: Joan Mayans;  Surgeon: Irving Copas., MD;  Location: [facility];  Service: Gastroenterology;;   TONSILLECTOMY       reports that he has never smoked. He has never used smokeless tobacco. He reports previous alcohol use. He reports that he does not use drugs.  Allergies  Allergen Reactions   Alprazolam Other (See Comments)    Excessive sedation   Penicillins Rash    Childhood allergy Has patient had a PCN reaction causing immediate rash, facial/tongue/throat swelling, SOB or lightheadedness with hypotension: Unknown Has patient had a PCN reaction causing severe rash involving mucus membranes or skin necrosis: Unknown Has patient had a PCN reaction that required hospitalization: No Has patient had a PCN reaction occurring within the last 10 years: No If all of the above answers are "NO", then may proceed with Cephalosporin use.     Family History  Adopted: Yes  Problem Relation Age of Onset   Cancer Father    Heart disease Father    Colon cancer Neg Hx      Prior to Admission medications   Medication Sig Start Date End Date Taking? Authorizing Provider  acetaminophen (TYLENOL) 500 MG tablet Take 500-1,000 mg by mouth every 6 (six) hours as needed for moderate pain.     [provider]  albuterol (VENTOLIN HFA) 108 (90 Base) MCG/ACT inhaler Inhale 2 puffs into the lungs every 6 (six) hours as needed for wheezing or shortness of breath. 07/10/18   Mosie Lukes, MD  clotrimazole (MYCELEX) 10 MG troche Take 1 lozenge (10 mg total) by mouth 4 (four) times daily. While on decadron Patient taking differently: Take 10 mg by mouth 4 (four) times daily as needed (oral thrush).  04/17/18    Ladell Pier, MD  dexamethasone (DECADRON) 1 MG tablet Take 2 tablets (2 mg total) by mouth daily. 05/08/18   Vaslow, Acey Lav, MD  dexamethasone (DECADRON) 2 MG tablet Take 1 mg by mouth daily.    [provider]  docusate sodium (STOOL SOFTENER) 100 MG capsule Take 100-200 mg by mouth 5 (five) times daily as needed (constipation).     [provider]  dronabinol (MARINOL) 5 MG capsule Take 1 capsule by mouth three times daily as needed 07/20/18   Mosie Lukes, MD  fluticasone (FLOVENT HFA) 110 MCG/ACT inhaler Inhale 2 puffs into the lungs 2 (two) times daily. 03/27/18   Mosie Lukes, MD  furosemide (LASIX) 20 MG tablet Take 1 tablet (20 mg total) by mouth daily. 06/30/18   Ladell Pier, MD  lamoTRIgine (LAMICTAL) 100 MG tablet Take 1  tablet (100 mg total) by mouth 2 (two) times daily. 06/30/18   Ladell Pier, MD  lidocaine-prilocaine (EMLA) cream Apply 1 application topically as needed (prior to port access). 02/13/18   Owens Shark, NP  omeprazole (PRILOSEC) 40 MG capsule Take 1 capsule (40 mg total) by mouth 2 (two) times a day for 30 days. 07/19/18 08/18/18  Mansouraty, Telford Nab., MD  ondansetron (ZOFRAN) 4 MG tablet Take 1 tablet (4 mg total) by mouth every 8 (eight) hours as needed for nausea or vomiting. 07/10/18   Mosie Lukes, MD  prochlorperazine (COMPAZINE) 10 MG tablet Take 1 tablet (10 mg total) by mouth every 6 (six) hours as needed for nausea or vomiting. 06/30/18   Ladell Pier, MD  sennosides-docusate sodium (SENOKOT-S) 8.6-50 MG tablet Take 1 tablet by mouth 2 (two) times daily as needed for constipation ((scheduled at bedtime)).     [provider]  tiZANidine (ZANAFLEX) 4 MG tablet TAKE 1/2 TO 1 (ONE-HALF TO ONE) TABLET BY MOUTH EVERY 8 HOURS AS NEEDED FOR MUSCLE SPASM Patient taking differently: Take 2-4 mg by mouth every 8 (eight) hours as needed (muscle pain.).  05/04/18   Mosie Lukes, MD  trifluridine-tipiracil (LONSURF)  15-6.14 MG tablet Take 3 tablets (78m trifluridine) by mouth 2 times daily, within 1 hr of food on days 1-5 and 8-12 of each 28 day cycle 07/03/18   SLadell Pier MD    Physical Exam: Vitals:   07/25/18 1948 07/25/18 2000 07/25/18 2027 07/25/18 2100  BP: 119/88 (!) 89/44 123/83 111/69  Pulse: 89 80 77 79  Resp: (!) '21 17 18 15  ' Temp:      TempSrc:      SpO2: 98% 99% 100% 98%    Constitutional: Chronically ill-appearing man resting supine in bed, NAD, calm, appears tired Eyes: PERRL, scleral icterus, peripheral vision loss on the right ENMT: Mucous membranes are dry. Posterior pharynx clear of any exudate or lesions.Normal dentition.  Neck: normal, supple, no masses. Respiratory: clear to auscultation bilaterally, no wheezing, no crackles. Normal respiratory effort. No accessory muscle use.  Cardiovascular: Regular rate and rhythm, no murmurs / rubs / gallops.  2+ pitting edema bilateral lower extremities. Abdomen: Colostomy in place left lower abdomen, no tenderness, no masses palpated. Bowel sounds diminished. Musculoskeletal: no clubbing / cyanosis. No joint deformity upper and lower extremities.  Range of motion intact, no contractures.   Skin: Jaundiced appearance, no rashes, lesions, ulcers. No induration Neurologic: CN 2-12 grossly intact except for right peripheral vision loss. Sensation intact, generalized weakness but strength appears intact.  Tremors of both hands with movement.  No asterixis. Psychiatric: Normal judgment and insight. Alert and oriented x 3. Normal mood.     Labs on Admission: I have personally reviewed following labs and imaging studies  CBC: Recent Labs  Lab 07/25/18 1933  WBC 3.0*  NEUTROABS 2.0  HGB 12.1*  HCT 36.9*  MCV 90.7  PLT 90*   Basic Metabolic Panel: Recent Labs  Lab 07/25/18 1517  NA 131*  K 3.9  CL 93*  CO2 26  GLUCOSE 98  BUN 47*  CREATININE 2.12*  CALCIUM 8.7   8.7   GFR: Estimated Creatinine Clearance: 44.5 mL/min  (A) (by C-G formula based on SCr of 2.12 mg/dL (H)). Liver Function Tests: Recent Labs  Lab 07/25/18 1334  AST 101*  ALT 46  ALKPHOS 407*  BILITOT 37.9*  PROT 4.9*  ALBUMIN 3.0*   Recent Labs  Lab 07/25/18  1933  LIPASE 48   No results for input(s): AMMONIA in the last 168 hours. Coagulation Profile: Recent Labs  Lab 07/25/18 1933  INR 2.0*   Cardiac Enzymes: No results for input(s): CKTOTAL, CKMB, CKMBINDEX, TROPONINI in the last 168 hours. BNP (last 3 results) No results for input(s): PROBNP in the last 8760 hours. HbA1C: No results for input(s): HGBA1C in the last 72 hours. CBG: No results for input(s): GLUCAP in the last 168 hours. Lipid Profile: No results for input(s): CHOL, HDL, LDLCALC, TRIG, CHOLHDL, LDLDIRECT in the last 72 hours. Thyroid Function Tests: No results for input(s): TSH, T4TOTAL, FREET4, T3FREE, THYROIDAB in the last 72 hours. Anemia Panel: No results for input(s): VITAMINB12, FOLATE, FERRITIN, TIBC, IRON, RETICCTPCT in the last 72 hours. Urine analysis:    Component Value Date/Time   COLORURINE AMBER (A) 07/25/2018 1947   APPEARANCEUR CLEAR 07/25/2018 1947   LABSPEC 1.016 07/25/2018 1947   LABSPEC 1.020 10/25/2016 1558   PHURINE 5.0 07/25/2018 1947   GLUCOSEU NEGATIVE 07/25/2018 1947   GLUCOSEU Negative 10/25/2016 1558   HGBUR NEGATIVE 07/25/2018 1947   BILIRUBINUR MODERATE (A) 07/25/2018 1947   BILIRUBINUR Negative 10/25/2016 1558   KETONESUR NEGATIVE 07/25/2018 1947   PROTEINUR NEGATIVE 07/25/2018 1947   UROBILINOGEN 0.2 10/25/2016 1558   NITRITE NEGATIVE 07/25/2018 [place] 07/25/2018 1947   LEUKOCYTESUR Negative 10/25/2016 1558    Radiological Exams on Admission: Dg Abd 1 View  Result Date: 07/25/2018 CLINICAL DATA:  Right upper quadrant pain since ERCP 07/19/2018. EXAM: ABDOMEN - 1 VIEW COMPARISON:  Images from ERCP 07/19/2018. FINDINGS: Common bile duct and pancreatic stents are in place. The patient is  status post cholecystectomy. Left lower quadrant ostomy is identified. The bowel gas pattern is normal. No radio-opaque calculi or other significant radiographic abnormality are seen. IMPRESSION: No acute abnormality. Electronically Signed   By: Inge Rise M.D.   On: 07/25/2018 16:38   Dg Chest Port 1 View  Result Date: 07/25/2018 CLINICAL DATA:  Cough EXAM: PORTABLE CHEST 1 VIEW COMPARISON:  Chest x-ray dated 07/19/2018. CT chest dated 04/19/2018 FINDINGS: A left subclavian Port-A-Cath is again noted. This is well positioned. The right lung field is mostly clear with a few scattered opacities. Persistent elevation of the left hemidiaphragm with left-sided volume loss is noted. Hazy airspace opacities are again noted throughout the left lung field. There may be a more focal opacity medial to the Port-A-Cath. No pneumothorax. IMPRESSION: 1. Grossly stable appearance of the chest with diffuse hazy airspace opacities throughout the left lung field with mild to moderate left-sided volume loss. 2. Stable left subclavian Port-A-Cath. Electronically Signed   By: Constance Holster M.D.   On: 07/25/2018 19:36    EKG: Not performed.  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   Biliary obstruction with hyperbilirubinemia: Patient with worsening hyperbilirubinemia and functional decline despite ERCP and biliary and pancreatic stent placement on 07/19/2018.  GI were consulted and will consider stent removal in the a.m.  May need further IR evaluation for possible percutaneous  intervention. -Continue IV ciprofloxacin -Continue IV fluids overnight -Pain control and antiemetics  as needed -N.p.o. at midnight -GI consulted and to see in a.m. -Patient's oncologist, Dr. Benay Spice, is aware and will see patient in a.m.  Metastatic rectal cancer metastatic to the liver, lungs, and brain: Follows closely with oncology, radiation/oncology, and GI.  New brain metastasis with worsening edema seen on recent MRI. -Continue dexamethasone for brain lesion/edema -Continue Lamictal for seizure prophylaxis -Continue pain control as needed  Acute kidney injury: Likely prerenal due to frequent GI losses. -Continue fluids overnight and monitor labs  Thrombocytopenia: Appears to be at his recent baseline.  No obvious bleeding.  Continue to monitor.  Goals of care: CODE STATUS was discussed on admission.  Per my conversation, patient is agreeable with current management and plans for further work-up, however in the event of cardiac arrest or respiratory failure he would not want to undergo CPR, defibrillation, or intubation.  He tells me at this time he would like his CODE STATUS to be DO NOT RESUSCITATE which I have ordered.  DVT prophylaxis: SCDs Code Status: DNR/DNI, confirmed with patient at bedside Family Communication: None present on admission Disposition Plan: Pending clinical progress Consults called: GI and oncology to see in a.m. Admission status: Inpatient   Zada Finders MD [facility] Pager [phone]  If 7PM-7AM, please contact night-coverage [email or web address]  07/25/2018, 10:21 PM

## 2018-07-25 NOTE — Telephone Encounter (Signed)
I was able to add on a BMP to the patient's labs from this morning. He has progressive acute renal insufficiency. He needs aggressive IV fluids over the course of this afternoon into the evening. Would also consider the role of sending a lipase and amylase even though less likely to be pancreatitis at this point in time we should have that sense of things as well. Not sure that a contrasted CT scan will be possible now but at least a noncontrast CT of the abdomen should be considered to help guide potential therapeutic maneuvers or options for tomorrow as well as Dr. Gearldine Shown oncologic discussions.  Justice Britain, MD [facility] Office # [phone]

## 2018-07-25 NOTE — Telephone Encounter (Signed)
Called regarding upcoming Webex appointment, left patient a voicemail and sent an e-mail.

## 2018-07-25 NOTE — Telephone Encounter (Signed)
I called to speak with the patient and was able to get his wife. I reviewed his hepatic function panel which shows evidence of progressive worsening. His total bilirubin is now 38. I reviewed his KUB which is not finalized and reports that his stent looks to be in good position but there is no overt evidence of pneumobilia. The patient was at home and denied any fevers but has had chills has had on and off weeks. Abdominal discomfort is unchanged from prior. Is nausea is slightly improved. Overall, it is now evident that ERCP will not be enough for this patient in regards to his obstructive process. I think he needs to be in the hospital at this point in time. This is a very difficult for the patient and for his wife to here as they want to try and minimize issues of COVID-19, however, he has no further outpatient options that we will be available.  If a percutaneous biliary drain is to be placed he will need to have that as an inpatient. Based on his clinical status is probably best that he come to the emergency department. Repeat cross-sectional imaging with a CT abdomen should be performed this afternoon/evening. Inpatient admission This will be best to help determine for my IR colleagues about possible percutaneous route for the patient. He should be initiated on IV antibiotics (GNR coverage - Zosyn v Unasyn reasonable) as he will now have finished 5 days worth of antibiotics today and I would maintain those until further evaluation by oncology as well as decision for possible intervention procedures are performed. It is possible that IR may need stent to be removed for them to be able to safely place there drainage catheters and if that is the question then that can be directed to our inpatient GI service. Dr. Benay Spice is aware and I have spoken with him just a little while ago. He will see the patient in consultation tomorrow, to discuss overall goals and to determine next steps in his  therapeutic management. I called back the patient's wife and they agree to this plan of action.   Jacob Britain, MD [facility] Office # [phone]

## 2018-07-25 NOTE — Telephone Encounter (Addendum)
Called wife to follow up on Jacob [name]'s status. She reports he declined to go to [facility] for xray or labs yesterday. Says he may go this afternoon or tomorrow. She states he still has some mild confusion and memory deficits. Pain in back and abdomen and more nausea with vomiting. Taking minimal fluids or food. He is very weak, and she helps him ambulate for safety. Legs are still very swollen and she thinks he is very depressed. Offered her to bring him to this office today and she declined. Stressed to her the sooner we can diagnose what is wrong the sooner we can attempt to improve his condition and that the longer he delays help, it can impact his survival. She understands this and feels they do need to have "the talk" with Jacob [name]. She will call back if he decides to come sooner than his appointment on 07/27/18. Jacob [name] aware. Per Jacob [name], if too difficult to come in can offer WebEx on Wednesday if they prefer. Wife does wish to have WebEx with Jacob [name] tomorrow at 10:30. They have the ability to do this on computer and Jacob [name] is able to participate as well. Notified scheduler of request.

## 2018-07-25 NOTE — ED Notes (Signed)
ED TO INPATIENT HANDOFF REPORT  Name/Age/Gender Lolita Patella 48 y.o. male  Code Status Code Status History    Date Active Date Inactive Code Status Order ID Comments User Context   02/10/2018 1240 02/11/2018 1428 Full Code [id number]  Melissa Montane, MD Inpatient   08/17/2016 1224 08/21/2016 1344 Full Code [id number]  Leighton Ruff, MD Inpatient   09/17/2013 1416 09/24/2013 1539 Full Code [id number]  Stark Klein, MD Inpatient      Home/SNF/Other Home  Chief Complaint Bilirubin is High  Level of Care/Admitting Diagnosis ED Disposition    ED Disposition Condition [facility] Area: [facility] [[id number]]  Level of Care: Med-Surg [16]  Covid Evaluation: N/A  Diagnosis: 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 Allergic state 01/19/2012  . Anemia   . 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 Cancer of brain [facility])    radiation  . Cancer of lung ([place])   . Chicken pox as a child   ?  Marland Kitchen Dysrhythmia as child   HX OF IRREGULAR HB AT TIME OF TONSILLECTOMY - SURGERY WAS NOT DONE-CAN'T REMEMBER ANY OTHER DETAILS- NEVER HAD THE SURGERY.  . Elevated liver function tests 01/19/2012  . Fatty liver   . Hernia 6 months old  . History of diarrhea    better since chemo finished  . Hx of migraines   . Lung abnormality 2015  . Lung nodule 08/2013  . Lung nodule, multiple 11/29/2013  . Memory loss   . Migraine headache as a child  . Numbness    TOES OF BOTH FEET.  Marland Kitchen Overweight(278.02) 01/19/2012  . Pain    LEFT HEEL PAIN  -SEVERE ESPECIALLY AFTER SITTING OR LYING DOWN - DIFFICULT TO GET OUT OF BED AND WALK IN THE MORNINGS BECAUSE OF HEEL PAIN  . 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 Preventative health care 01/19/2012  . Radiation 03/20/12-04/27/12   Pelvis 50.4 gray Rectal cancer  . 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 Reflux 01/19/2012  . Seizure ([place])    last one 2019  . Sun-damaged skin 01/19/2012    Allergies Allergies  Allergen Reactions  . Alprazolam Other (See Comments)    Excessive sedation  . Penicillins Rash    Childhood allergy Has patient had a PCN reaction causing immediate rash, facial/tongue/throat swelling, SOB or lightheadedness with hypotension: Unknown Has patient had a PCN reaction causing severe rash involving mucus membranes or skin necrosis: Unknown Has patient had a PCN reaction that required hospitalization: No Has patient had a PCN reaction occurring within the last 10 years: No If all of the above answers are "NO", then may proceed with Cephalosporin use.     IV Location/Drains/Wounds Patient Lines/Drains/Airways Status   Active Line/Drains/Airways    Name:   Placement date:   Placement time:   Site:   Days:   Implanted Port 03/21/13 Left Chest   03/21/13    -    Chest  1952   Peripheral IV 07/25/18 Left Hand   07/25/18    1937    Hand   less than 1   Peripheral IV 07/25/18 Right Arm   07/25/18    1937    Arm   less than 1   Closed System Drain 1 Right RUQ Bulb (JP) 19 Fr.   09/17/13    -    RUQ   1772   Colostomy LUQ   08/17/16    1007    LUQ   707   GI Stent 4 Fr.   07/19/18    1120    -   6   GI Stent 7 Fr.   07/19/18    1146    -   6   Incision 12/07/12 Abdomen   12/07/12    1046     2056   Incision 03/21/13 Chest Left   03/21/13    0917     1952   Incision (Closed) 09/17/13 Abdomen Right   09/17/13    1129     1772   Incision (Closed) 09/17/13 Other (Comment) Other (Comment)   09/17/13    1156      1772   Incision (Closed) 08/17/16 Abdomen Other (Comment)   08/17/16    1020     707   Incision (Closed) 05/16/17 Throat Left   05/16/17    1349     435   Incision (Closed) 02/10/18 Neck Other (Comment)   02/10/18    1049     165   Incision - 1 Port Chest Left;Medial   -    -        Incision - 1 Port Chest Left   03/21/13    -     1952   Incision - 2 Ports Abdomen 1: Umbilicus 2: Mid;Upper   09/17/13    0820     1772   Incision - 2 Ports Abdomen Umbilicus Left   42/35/36    0830     707   Incision - 3 Ports Abdomen 1: Superior 2: Right;Upper 3: Umbilicus   14/43/15    -     2217          Labs/Imaging Results for orders placed or performed during the hospital encounter of 07/25/18 (from the past 48 hour(s))  CBC with Differential     Status: Abnormal   Collection Time: 07/25/18  7:33 PM  Result Value Ref Range   WBC 3.0 (L) 4.0 - 10.5 K/uL   RBC 4.07 (L) 4.22 - 5.81 MIL/uL   Hemoglobin 12.1 (L) 13.0 - 17.0 g/dL   HCT 36.9 (L) 39.0 - 52.0 %   MCV 90.7 80.0 - 100.0 fL   MCH 29.7 26.0 - 34.0 pg   MCHC 32.8 30.0 - 36.0 g/dL   RDW 31.4 (H) 11.5 - 15.5 %   Platelets 90 (L) 150 - 400 K/uL    Comment: REPEATED TO VERIFY PLATELET COUNT CONFIRMED BY SMEAR Immature Platelet Fraction may be clinically indicated, consider ordering this additional test [id number]    nRBC 0.0 0.0 - 0.2 %   Neutrophils Relative % 66 %   Neutro Abs 2.0 1.7 - 7.7 K/uL   Lymphocytes Relative 4 %   Lymphs Abs 0.1 (L) 0.7 - 4.0 K/uL   Monocytes Relative 22 %   Monocytes Absolute 0.7 0.1 - 1.0 K/uL   Eosinophils Relative 0 %   Eosinophils Absolute 0.0 0.0 - 0.5 K/uL  Basophils Relative 1 %   Basophils Absolute 0.0 0.0 - 0.1 K/uL   Immature Granulocytes 7 %   Abs Immature Granulocytes 0.20 (H) 0.00 - 0.07 K/uL   Target Cells PRESENT     Comment: Performed at [facility], [place] [address] [place], [place] [address]  Lipase, blood     Status: None   Collection Time: 07/25/18  7:33 PM   Result Value Ref Range   Lipase 48 11 - 51 U/L    Comment: Performed at [facility], [place] [address], [place], [place] [address]  SARS Coronavirus 2 (CEPHEID- Performed in [place] hospital lab), Hosp Order     Status: None   Collection Time: 07/25/18  7:33 PM  Result Value Ref Range   SARS Coronavirus 2 NEGATIVE NEGATIVE    Comment: (NOTE) If result is NEGATIVE SARS-CoV-2 target nucleic acids are NOT DETECTED. The SARS-CoV-2 RNA is generally detectable in upper and lower  respiratory specimens during the acute phase of infection. The lowest  concentration of SARS-CoV-2 viral copies this assay can detect is 250  copies / mL. A negative result does not preclude SARS-CoV-2 infection  and should not be used as the sole basis for treatment or other  patient management decisions.  A negative result may occur with  improper specimen collection / handling, submission of specimen other  than nasopharyngeal swab, presence of viral mutation(s) within the  areas targeted by this assay, and inadequate number of viral copies  (<250 copies / mL). A negative result must be combined with clinical  observations, patient history, and epidemiological information. If result is POSITIVE SARS-CoV-2 target nucleic acids are DETECTED. The SARS-CoV-2 RNA is generally detectable in upper and lower  respiratory specimens dur ing the acute phase of infection.  Positive  results are indicative of active infection with SARS-CoV-2.  Clinical  correlation with patient history and other diagnostic information is  necessary to determine patient infection status.  Positive results do  not rule out bacterial infection or co-infection with other viruses. If result is PRESUMPTIVE POSTIVE SARS-CoV-2 nucleic acids MAY BE PRESENT.   A presumptive positive result was obtained on the submitted specimen  and confirmed on repeat testing.  While 2019 novel coronavirus  (SARS-CoV-2) nucleic acids may be  present in the submitted sample  additional confirmatory testing may be necessary for epidemiological  and / or clinical management purposes  to differentiate between  SARS-CoV-2 and other Sarbecovirus currently known to infect humans.  If clinically indicated additional testing with an alternate test  methodology [phone]) is advised. The SARS-CoV-2 RNA is generally  detectable in upper and lower respiratory sp ecimens during the acute  phase of infection. The expected result is Negative. Fact Sheet for Patients:  StrictlyIdeas.no Fact Sheet for Healthcare Providers: BankingDealers.co.za This test is not yet approved or cleared by the [place] FDA and has been authorized for detection and/or diagnosis of SARS-CoV-2 by FDA under an Emergency Use Authorization (EUA).  This EUA will remain in effect (meaning this test can be used) for the duration of the COVID-19 declaration under Section 564(b)(1) of the Act, 21 U.S.C. section 360bbb-3(b)(1), unless the authorization is terminated or revoked sooner. Performed at [facility], [place] [address] [place],  [address]   Protime-INR     Status: Abnormal   Collection Time: 07/25/18  7:33 PM  Result Value Ref Range   Prothrombin Time 22.4 (H) 11.4 - 15.2 seconds   INR 2.0 (  H) 0.8 - 1.2    Comment: (NOTE) INR goal varies based on device and disease states. Performed at [facility], [place] [address] [place], [place] [address]   Lactic acid, plasma     Status: None   Collection Time: 07/25/18  7:33 PM  Result Value Ref Range   Lactic Acid, Venous 1.5 0.5 - 1.9 mmol/L    Comment: Performed at [facility], [place] [address] [place], [place] [address]  Urinalysis, Routine w reflex microscopic     Status: Abnormal   Collection Time: 07/25/18  7:47 PM  Result Value Ref Range   Color, Urine AMBER (A) YELLOW    Comment: BIOCHEMICALS  MAY BE AFFECTED BY COLOR   APPearance CLEAR CLEAR   Specific Gravity, Urine 1.016 1.005 - 1.030   pH 5.0 5.0 - 8.0   Glucose, UA NEGATIVE NEGATIVE mg/dL   Hgb urine dipstick NEGATIVE NEGATIVE   Bilirubin Urine MODERATE (A) NEGATIVE   Ketones, ur NEGATIVE NEGATIVE mg/dL   Protein, ur NEGATIVE NEGATIVE mg/dL   Nitrite NEGATIVE NEGATIVE   Leukocytes,Ua NEGATIVE NEGATIVE    Comment: Performed at [place] [address], [place], [place] [address]   *Note: Due to a large number of results and/or encounters for the requested time period, some results have not been displayed. A complete set of results can be found in Results Review.   Dg Abd 1 View  Result Date: 07/25/2018 CLINICAL DATA:  Right upper quadrant pain since ERCP 07/19/2018. EXAM: ABDOMEN - 1 VIEW COMPARISON:  Images from ERCP 07/19/2018. FINDINGS: Common bile duct and pancreatic stents are in place. The patient is status post cholecystectomy. Left lower quadrant ostomy is identified. The bowel gas pattern is normal. No radio-opaque calculi or other significant radiographic abnormality are seen. IMPRESSION: No acute abnormality. Electronically Signed   By: Inge Rise M.D.   On: 07/25/2018 16:38   Dg Chest Port 1 View  Result Date: 07/25/2018 CLINICAL DATA:  Cough EXAM: PORTABLE CHEST 1 VIEW COMPARISON:  Chest x-ray dated 07/19/2018. CT chest dated 04/19/2018 FINDINGS: A left subclavian Port-A-Cath is again noted. This is well positioned. The right lung field is mostly clear with a few scattered opacities. Persistent elevation of the left hemidiaphragm with left-sided volume loss is noted. Hazy airspace opacities are again noted throughout the left lung field. There may be a more focal opacity medial to the Port-A-Cath. No pneumothorax. IMPRESSION: 1. Grossly stable appearance of the chest with diffuse hazy airspace opacities throughout the left lung field with mild to moderate left-sided volume loss. 2. Stable  left subclavian Port-A-Cath. Electronically Signed   By: Constance Holster M.D.   On: 07/25/2018 19:36    Pending Labs Unresulted Labs (From admission, onward)    Start     Ordered   07/25/18 1912  Lactic acid, plasma  Now then every 2 hours,   STAT     07/25/18 1913   07/25/18 1911  Culture, blood (routine x 2)  BLOOD CULTURE X 2,   STAT     07/25/18 1911          Vitals/Pain Today's Vitals   07/25/18 1948 07/25/18 2000 07/25/18 2027 07/25/18 2100  BP: 119/88 (!) 89/44 123/83 111/69  Pulse: 89 80 77 79  Resp: (!) 21 17 18 15   Temp:      TempSrc:      SpO2: 98% 99% 100% 98%    Isolation Precautions Droplet and Contact precautions  Medications Medications  iohexol (OMNIPAQUE) 300 MG/ML solution 30 mL (has no administration in time range)  sodium chloride 0.9 % bolus 1,000 mL (0 mLs Intravenous Stopped 07/25/18 2142)  ondansetron (ZOFRAN) injection 4 mg (4 mg Intravenous Given 07/25/18 1944)  ciprofloxacin (CIPRO) IVPB 400 mg (0 mg Intravenous Stopped 07/25/18 2143)    Mobility walks with device

## 2018-07-25 NOTE — ED Notes (Signed)
Patient transported to CT 

## 2018-07-26 ENCOUNTER — Encounter (HOSPITAL_COMMUNITY): Payer: Self-pay | Admitting: Certified Registered"

## 2018-07-26 ENCOUNTER — Inpatient Hospital Stay: Payer: BC Managed Care – PPO | Admitting: Oncology

## 2018-07-26 ENCOUNTER — Telehealth: Payer: Self-pay | Admitting: Oncology

## 2018-07-26 ENCOUNTER — Telehealth: Payer: Self-pay

## 2018-07-26 ENCOUNTER — Ambulatory Visit: Payer: BC Managed Care – PPO | Admitting: Radiation Oncology

## 2018-07-26 DIAGNOSIS — Z933 Colostomy status: Secondary | ICD-10-CM

## 2018-07-26 DIAGNOSIS — Z515 Encounter for palliative care: Secondary | ICD-10-CM

## 2018-07-26 DIAGNOSIS — C801 Malignant (primary) neoplasm, unspecified: Secondary | ICD-10-CM

## 2018-07-26 DIAGNOSIS — C20 Malignant neoplasm of rectum: Principal | ICD-10-CM

## 2018-07-26 DIAGNOSIS — E860 Dehydration: Secondary | ICD-10-CM

## 2018-07-26 DIAGNOSIS — D696 Thrombocytopenia, unspecified: Secondary | ICD-10-CM

## 2018-07-26 DIAGNOSIS — C7931 Secondary malignant neoplasm of brain: Secondary | ICD-10-CM

## 2018-07-26 DIAGNOSIS — N179 Acute kidney failure, unspecified: Secondary | ICD-10-CM

## 2018-07-26 DIAGNOSIS — C7801 Secondary malignant neoplasm of right lung: Secondary | ICD-10-CM

## 2018-07-26 DIAGNOSIS — C7800 Secondary malignant neoplasm of unspecified lung: Secondary | ICD-10-CM

## 2018-07-26 DIAGNOSIS — Z9689 Presence of other specified functional implants: Secondary | ICD-10-CM

## 2018-07-26 DIAGNOSIS — D61818 Other pancytopenia: Secondary | ICD-10-CM

## 2018-07-26 DIAGNOSIS — R627 Adult failure to thrive: Secondary | ICD-10-CM

## 2018-07-26 DIAGNOSIS — K831 Obstruction of bile duct: Secondary | ICD-10-CM

## 2018-07-26 DIAGNOSIS — R64 Cachexia: Secondary | ICD-10-CM

## 2018-07-26 DIAGNOSIS — C787 Secondary malignant neoplasm of liver and intrahepatic bile duct: Secondary | ICD-10-CM

## 2018-07-26 DIAGNOSIS — C7802 Secondary malignant neoplasm of left lung: Secondary | ICD-10-CM

## 2018-07-26 DIAGNOSIS — B370 Candidal stomatitis: Secondary | ICD-10-CM

## 2018-07-26 LAB — HEPATIC FUNCTION PANEL
ALT: 46 U/L — ABNORMAL HIGH (ref 0–44)
AST: 96 U/L — ABNORMAL HIGH (ref 15–41)
Albumin: 2.1 g/dL — ABNORMAL LOW (ref 3.5–5.0)
Alkaline Phosphatase: 335 U/L — ABNORMAL HIGH (ref 38–126)
Bilirubin, Direct: 17.9 mg/dL — ABNORMAL HIGH (ref 0.0–0.2)
Indirect Bilirubin: 14.2 mg/dL — ABNORMAL HIGH (ref 0.3–0.9)
Total Bilirubin: 32.1 mg/dL (ref 0.3–1.2)
Total Protein: 5 g/dL — ABNORMAL LOW (ref 6.5–8.1)

## 2018-07-26 LAB — BASIC METABOLIC PANEL
Anion gap: 12 (ref 5–15)
BUN: 44 mg/dL — ABNORMAL HIGH (ref 6–20)
CO2: 23 mmol/L (ref 22–32)
Calcium: 8.5 mg/dL — ABNORMAL LOW (ref 8.9–10.3)
Chloride: 96 mmol/L — ABNORMAL LOW (ref 98–111)
Creatinine, Ser: 1.45 mg/dL — ABNORMAL HIGH (ref 0.61–1.24)
GFR calc Af Amer: >60 mL/min (ref >60)
GFR calc non Af Amer: 57 mL/min — ABNORMAL LOW (ref >60)
Glucose, Bld: 76 mg/dL (ref 70–99)
Potassium: 4.1 mmol/L (ref 3.5–5.1)
Sodium: 131 mmol/L — ABNORMAL LOW (ref 135–145)

## 2018-07-26 LAB — CBC
HCT: 32.1 % — ABNORMAL LOW (ref 39.0–52.0)
Hemoglobin: 10.5 g/dL — ABNORMAL LOW (ref 13.0–17.0)
MCH: 29.6 pg (ref 26.0–34.0)
MCHC: 32.7 g/dL (ref 30.0–36.0)
MCV: 90.4 fL (ref 80.0–100.0)
Platelets: 70 10*3/uL — ABNORMAL LOW (ref 150–400)
RBC: 3.55 MIL/uL — ABNORMAL LOW (ref 4.22–5.81)
RDW: 31.7 % — ABNORMAL HIGH (ref 11.5–15.5)
WBC: 2.4 10*3/uL — ABNORMAL LOW (ref 4.0–10.5)
nRBC: 0 % (ref 0.0–0.2)

## 2018-07-26 MED ORDER — LACTATED RINGERS IV SOLN
INTRAVENOUS | Status: DC
  Administered 2018-07-26: 17:00:00 via INTRAVENOUS

## 2018-07-26 MED ORDER — ADULT MULTIVITAMIN W/MINERALS CH
1.0000 | ORAL_TABLET | Freq: Every day | ORAL | Status: DC
  Administered 2018-07-26 – 2018-07-27 (×2): 1 via ORAL
  Filled 2018-07-26 (×2): qty 1

## 2018-07-26 MED ORDER — FLUCONAZOLE 100MG IVPB
100.0000 mg | INTRAVENOUS | Status: DC
  Administered 2018-07-26: 11:00:00 100 mg via INTRAVENOUS
  Filled 2018-07-26 (×2): qty 50

## 2018-07-26 MED ORDER — ENSURE ENLIVE PO LIQD: 237.0000 mL | Freq: Two times a day (BID) | ORAL | Status: DC

## 2018-07-26 MED ORDER — LACTATED RINGERS IV SOLN: INTRAVENOUS | Status: DC

## 2018-07-26 NOTE — Progress Notes (Signed)
CRITICAL VALUE ALERT  Critical Value:  Total Bilirubin- 32.1  Date & Time Notied: 07/26/18 6701  Provider Notified: K.Schorr  Orders Received/Actions taken:

## 2018-07-26 NOTE — TOC Progression Note (Signed)
Transition of Care [facility]) - Progression Note    Patient Details  Name: Zayvon Alicea MRN: [id number] Date of Birth: June 14, 1970  Transition of [facility]) CM/SW Contact  Leeroy Cha, RN Phone Number: 07/26/2018, 10:10 AM  Clinical Narrative:    tct-Jennifer Gretta Cool with [facility].  Will come and see patient.  For home hospice.   Expected Discharge Plan: Home w Hospice Care Barriers to Discharge: No Barriers Identified  Expected Discharge Plan and Services Expected Discharge Plan: [place] Determinants of Health (SDOH) Interventions    Readmission Risk Interventions No flowsheet data found.

## 2018-07-26 NOTE — Telephone Encounter (Signed)
Called patient regarding upcoming Webex appointment, left a voicemail. E-mail has been sent.

## 2018-07-26 NOTE — Progress Notes (Signed)
Initial Nutrition Assessment  RD working remotely.   DOCUMENTATION CODES:   Not applicable  INTERVENTION:  - will order Ensure Enlive po BID, each supplement provides 350 kcal and 20 grams of protein - will order daily multivitamin with minerals. - continue to encourage PO intakes.   NUTRITION DIAGNOSIS:   Increased nutrient needs related to chronic illness, catabolic illness, cancer and cancer related treatments as evidenced by estimated needs.  GOAL:   Patient will meet greater than or equal to 90% of their needs  MONITOR:   PO intake, Supplement acceptance, Labs, Weight trends  REASON FOR ASSESSMENT:   Malnutrition Screening Tool  ASSESSMENT:   48 y.o. 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 He presented to the ED by recommendation of his gastroenterologist for worsening hyperbilirubinemia and persistent N/V, poor oral intake, decreased colostomy output, BLE swelling, dyspnea on exertion, and lethargy going on for about 1 and half weeks. He underwent ERCP on 4/29 which showed a single localized severe malignant appearing biliary stricture. A plastic biliary stent was placed into the R hepatic duct and 1 temporary plastic pancreatic stent was placed into the ventral pancreatic duct to decrease risk of post-ERCP pancreatitis. A non-bleeding duodenal ulcer with clean base was seen. Patient noticed R peripheral vision loss several weeks ago. He had MRI brain with and without contrast on 4/22 which showed a new 8 mm metastasis in the R middle cerebellar peduncle and mildly increased size of all other lesions including a 5 cm left occipital lesion.  BMI indicates normal weight. Patient was able to eat about half of lunch earlier today. RN flow sheet indicates 45% of meal (220 kcal, 5 grams protein). Diet had been advanced from NPO to [place] today at 10:20 AM and patient to  be NPO again at midnight.  Per notes today, patient has had recent disease progression. Further system therapy or treatment of brain lesions is not being recommended at this time. Notes indicates oral candidiasis. A consult was placed for hospice for home care.   Per chart review, current weight is 164 lb and weight on 4/29 was 168 lb. This indicates 4 lb weight loss (2.4% body weight) in the past week; significant for time frame. Suspect patient meets criteria for some degree of malnutrition, but unable to confirm at this time.     Medications reviewed; PRN marinol, 1 tablet senokot BID.  Labs reviewed; Na: 131 mmol/l, Cl: 96 mmol/l, BUN: 44 mg/dl, creatinine: 1.45 mg/dl, Ca: 8.5 mg/dl, Alk Phos, LFTs elevated, GFR: 57 ml/min.    NUTRITION - FOCUSED PHYSICAL EXAM:  unable to complete at this time.  Diet Order:   Diet Order            Diet NPO time specified  Diet effective midnight        Diet full liquid Room service appropriate? Yes; Fluid consistency: Thin  Diet effective now              EDUCATION NEEDS:   Not appropriate for education at this time  Skin:  Skin Assessment: Reviewed RN Assessment  Last BM:  5/4  Height:   Ht Readings from Last 1 Encounters:  07/25/18 '5\' 10"'  (1.778 m)    Weight:   Wt Readings from Last 1 Encounters:  07/26/18 74.4 kg    Ideal Body Weight:  75.4 kg  BMI:  Body mass index is 23.53 kg/m.  Estimated  Nutritional Needs:   Kcal:  2248-2500 kcal  Protein:  115-130 grams  Fluid:  >/= 2.2 L/day     Jarome Matin, MS, RD, LDN, [facility] Inpatient Clinical Dietitian Pager # [phone] After hours/weekend pager # [phone]

## 2018-07-26 NOTE — Consult Note (Addendum)
Consultation  Referring Provider: [facility] hospitalist/Sheikh Primary Care Physician:  Mosie Lukes, MD Primary Gastroenterologist:  Dr. Oretha Caprice  Reason for Consultation:  Widely metastatic rectal cancer with biliary obstruction  HPI: Jacob [name] is a 48 y.o. 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  He is status post colostomy,  seizure disorder, chronic thrombocytopenia, and had recently developed biliary obstruction. He underwent ERCP on 07/19/2018 per Dr. Rush Landmark finding of a single severe malignant appearing proximal biliary stricture. He had plastic stent placed into the right hepatic duct and also had a temporary plastic stent placed to be ventral pancreatic duct.  Also noted to have a nonbleeding duodenal ulcer with clean base proximal to the ampulla. Patient has not felt well over the past week with complaints of abdominal pain nausea vomiting, poor oral intake, weakness and progressive jaundice. Labs were done yesterday as an outpatient with finding of worsening hyperbilirubinemia with T bili of 37.9, alk phos 407, AST 101, ALT of 46.  Also noted to have acute kidney injury with creatinine of 2.1 and was advised to come to the ER for admission. He has been started on IV Cipro.  Noncontrasted CT scan done last evening shows bilateral pulmonary metastases in the lung bases, small left pleural effusion, biliary and pancreatic stents appear to be in place with no evidence of biliary ductal dilation, no change in metastases to the liver.  Dr. Benay Spice has seen patient this morning, and we briefly discussed patient with him.  He will be transitioning the patient to hospice care and have discussed that with patient this morning.  Patient has expressed desire to proceed with any other procedures felt to be possibly beneficial.  Creatinine has improved with hydration. Platelet count stable at 70,000 T bili improved at 32.1  today       Past Medical History:  Diagnosis Date   Allergic state 01/19/2012   Anemia    Cancer ([place]) 03/06/12   rectal bx=Adenocarcinoma  PT HAD RADIATION , CHEMO SURGERY   Cancer of brain ([place])    radiation   Cancer of lung ([facility])    Chicken pox as a child   ?   Dysrhythmia as child   HX OF IRREGULAR HB AT TIME OF TONSILLECTOMY - SURGERY WAS NOT DONE-CAN'T REMEMBER ANY OTHER DETAILS- NEVER HAD THE SURGERY.   Elevated liver function tests 01/19/2012   Fatty liver    Hernia 6 months old   History of diarrhea    better since chemo finished   Hx of migraines    Lung abnormality 2015   Lung nodule 08/2013   Lung nodule, multiple 11/29/2013   Memory loss    Migraine headache as a child   Numbness    TOES OF BOTH FEET.   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   mostly in feet   Preventative health care 01/19/2012   Radiation 03/20/12-04/27/12   Pelvis 50.4 gray Rectal cancer   Rectal cancer ([place]) 03/06/12   biopsy-adenocarcinoma   Reflux 01/19/2012   Seizure ([place])    last one 2019   Sun-damaged skin 01/19/2012    Past Surgical History:  Procedure Laterality Date   BILIARY DILATION  07/19/2018   Procedure: BILIARY DILATION;  Surgeon: Irving Copas., MD;  Location: [facility];  Service: Gastroenterology;;   BILIARY STENT PLACEMENT N/A 07/19/2018   Procedure: BILIARY STENT PLACEMENT;  Surgeon: Irving Copas., MD;  Location: [place];  Service: Gastroenterology;  Laterality: N/A;   CHOLECYSTECTOMY N/A 09/17/2013   Procedure: CHOLECYSTECTOMY;  Surgeon:  Klein, MD;  Location: [place];  Service: General;  Laterality: N/A;   COLOSTOMY TAKEDOWN N/A 08/17/2016   Procedure: LAPAROSCOPIC ABDOMINOPERINEAL RESECTION WITH  PERMANENT COLOSTOMY;  Surgeon: Leighton Ruff, MD;  [facility];  Service: General;  Laterality: N/A;   CYST EXCISION     L EAR AREA   ENDOSCOPIC RETROGRADE CHOLANGIOPANCREATOGRAPHY (ERCP) WITH PROPOFOL N/A 07/19/2018   Procedure: ENDOSCOPIC RETROGRADE CHOLANGIOPANCREATOGRAPHY (ERCP) WITH PROPOFOL;  Surgeon: Irving Copas., MD;  Location: [place];  Service: Gastroenterology;  Laterality: N/A;   EUS  03/14/2012   Procedure: LOWER ENDOSCOPIC ULTRASOUND (EUS);  Surgeon: Milus Banister, MD;  [facility];  Service: Endoscopy;  Laterality: N/A;   HERNIA REPAIR  6 months old   right inguinal repair   ILEOSTOMY CLOSURE N/A 12/07/2012   Procedure: ILEOSTOMY TAKEDOWN;  Surgeon: Leighton Ruff, MD;  [facility];  Service: General;  Laterality: N/A;   LAPAROSCOPIC LOW ANTERIOR RESECTION N/A 06/29/2012   Procedure: LAPAROSCOPIC LOW ANTERIOR RESECTION, mobilization splenic flexure,coloanal anastomosis,diverting ileostomy;  Surgeon: Leighton Ruff, MD;  [facility];  Service: General;  Laterality: N/A;   LAPAROSCOPY N/A 09/17/2013   Procedure: LAPAROSCOPY DIAGNOSTIC;  Surgeon:  Klein, MD;  Location: [place];  Service: General;  Laterality: N/A;   LARYNGOPLASTY Left 02/10/2018   Procedure: LARYNGOPLASTY;  Surgeon: Melissa Montane, MD;  Location: [place];  Service: ENT;  Laterality: Left;   LIVER ULTRASOUND N/A 09/17/2013   Procedure: LIVER ULTRASOUND;  Surgeon:  Klein, MD;  Location: [place];  Service: General;  Laterality: N/A;   MICROLARYNGOSCOPY W/VOCAL CORD INJECTION Left 05/16/2017   Procedure: MICROLARYNGOSCOPY WITH VOCAL CORD INJECTION;  Surgeon: Melissa Montane, MD;  Location: [place];  Service: ENT;  Laterality: Left;   OPEN HEPATECTOMY  N/A 09/17/2013   Procedure: OPEN HEPATECTOMY;  Surgeon:  Klein, MD;  Location: [facility];  Service: General;  Laterality: N/A;   PANCREATIC STENT PLACEMENT  07/19/2018   Procedure: PANCREATIC STENT  PLACEMENT;  Surgeon: Irving Copas., MD;  Location: [place];  Service: Gastroenterology;;   PORTACATH PLACEMENT Left 03/21/2013   Procedure: INSERTION PORT-A-CATH;  Surgeon: Leighton Ruff, MD;  [facility];  Service: General;  Laterality: Left;   RECTAL BIOPSY  03/06/12   distal mass1-2cm from anal verge=Adenocarcinoma   REMOVAL OF STONES  07/19/2018   Procedure: REMOVAL OF STONES;  Surgeon: Irving Copas., MD;  Location: [place];  Service: Gastroenterology;;   Joan Mayans  07/19/2018   Procedure: Joan Mayans;  Surgeon: Irving Copas., MD;  Location: [facility];  Service: Gastroenterology;;   TONSILLECTOMY      Prior to Admission medications   Medication Sig Start Date End Date Taking? Authorizing Provider  albuterol (VENTOLIN HFA) 108 (90 Base) MCG/ACT inhaler Inhale 2 puffs into the lungs every 6 (six) hours as needed for wheezing or shortness of breath. 07/10/18  Yes Mosie Lukes, MD  clotrimazole (MYCELEX) 10 MG troche Take 1 lozenge (10 mg total) by mouth 4 (four) times daily. While on decadron Patient taking differently: Take 10 mg by mouth 4 (four) times daily as needed (oral thrush).  04/17/18  Yes Ladell Pier, MD  dexamethasone (DECADRON) 1 MG  tablet Take 2 tablets (2 mg total) by mouth daily. Patient taking differently: Take 1 mg by mouth daily.  05/08/18  Yes Vaslow, Acey Lav, MD  docusate sodium (STOOL SOFTENER) 100 MG capsule Take 100-200 mg by mouth daily as needed for mild constipation.    Yes [provider]  dronabinol (MARINOL) 5 MG capsule Take 1 capsule by mouth three times daily as needed Patient taking differently: Take 5 mg by mouth 3 (three) times daily as needed (nausea).  07/20/18  Yes Mosie Lukes, MD  fluticasone (FLOVENT HFA) 110 MCG/ACT inhaler Inhale 2 puffs into the lungs 2 (two) times daily. 03/27/18  Yes Mosie Lukes, MD  furosemide (LASIX) 20 MG tablet Take 1 tablet (20 mg total) by mouth  daily. 06/30/18  Yes Ladell Pier, MD  Guaifenesin 1200 MG TB12 Take 1,200 mg by mouth 2 (two) times a day.   Yes [provider]  lamoTRIgine (LAMICTAL) 100 MG tablet Take 1 tablet (100 mg total) by mouth 2 (two) times daily. 06/30/18  Yes Ladell Pier, MD  lidocaine-prilocaine (EMLA) cream Apply 1 application topically as needed (prior to port access). 02/13/18  Yes Owens Shark, NP  omeprazole (PRILOSEC) 40 MG capsule Take 1 capsule (40 mg total) by mouth 2 (two) times a day for 30 days. 07/19/18 08/18/18 Yes Mansouraty, Telford Nab., MD  prochlorperazine (COMPAZINE) 10 MG tablet Take 1 tablet (10 mg total) by mouth every 6 (six) hours as needed for nausea or vomiting. 06/30/18  Yes Ladell Pier, MD  sennosides-docusate sodium (SENOKOT-S) 8.6-50 MG tablet Take 1 tablet by mouth 2 (two) times daily as needed for constipation.    Yes [provider]  ondansetron (ZOFRAN) 4 MG tablet Take 1 tablet (4 mg total) by mouth every 8 (eight) hours as needed for nausea or vomiting. Patient not taking: Reported on 07/25/2018 07/10/18   Mosie Lukes, MD  tiZANidine (ZANAFLEX) 4 MG tablet TAKE 1/2 TO 1 (ONE-HALF TO ONE) TABLET BY MOUTH EVERY 8 HOURS AS NEEDED FOR MUSCLE SPASM Patient not taking: No sig reported 05/04/18   Mosie Lukes, MD  trifluridine-tipiracil (LONSURF) 15-6.14 MG tablet Take 3 tablets (55m trifluridine) by mouth 2 times daily, within 1 hr of food on days 1-5 and 8-12 of each 28 day cycle Patient not taking: Reported on 07/25/2018 07/03/18   SLadell Pier MD    Current Facility-Administered Medications  Medication Dose Route Frequency Provider Last Rate Last Dose   albuterol (VENTOLIN HFA) 108 (90 Base) MCG/ACT inhaler 2 puff  2 puff Inhalation Q6H PRN PZada FindersR, MD       ciprofloxacin (CIPRO) IVPB 400 mg  400 mg Intravenous Q12H PZada FindersR, MD 200 mL/hr at 07/26/18 0856 400 mg at 07/26/18 0856   clotrimazole (MYCELEX) troche 10 mg  10 mg Oral  QID PRN PLenore Cordia MD       dexamethasone (DECADRON) tablet 1 mg  1 mg Oral Daily PLenore Cordia MD       dronabinol (MARINOL) capsule 5 mg  5 mg Oral TID PRN PLenore Cordia MD       fluconazole (DIFLUCAN) IVPB 100 mg  100 mg Intravenous Q24H SBetsy CoderB, MD       HYDROmorphone (DILAUDID) injection 0.5-1 mg  0.5-1 mg Intravenous Q3H PRN PLenore Cordia MD       lactated ringers infusion   Intravenous Continuous PLenore Cordia MD 125 mL/hr at 07/26/18  0515     lamoTRIgine (LAMICTAL) tablet 100 mg  100 mg Oral BID Zada Finders R, MD   100 mg at 07/25/18 2318   ondansetron (ZOFRAN) tablet 4 mg  4 mg Oral Q6H PRN Lenore Cordia, MD       Or   ondansetron (ZOFRAN) injection 4 mg  4 mg Intravenous Q6H PRN Lenore Cordia, MD       oxyCODONE (Oxy IR/ROXICODONE) immediate release tablet 5 mg  5 mg Oral Q4H PRN Lenore Cordia, MD       pantoprazole (PROTONIX) EC tablet 40 mg  40 mg Oral Daily Lenore Cordia, MD       senna-docusate (Senokot-S) tablet 1 tablet  1 tablet Oral BID Lenore Cordia, MD   1 tablet at 07/25/18 2318    Allergies as of 07/25/2018 - Review Complete 07/25/2018  Allergen Reaction Noted   Alprazolam Other (See Comments) 09/29/2017   Penicillins Rash 01/19/2012    Family History  Adopted: Yes  Problem Relation Age of Onset   Cancer Father    Heart disease Father    Colon cancer Neg Hx     Social History   Socioeconomic History   Marital status: Married    Spouse name: Not on file   Number of children: 1   Years of education: Not on file   Highest education level: Not on file  Occupational History   Occupation: Midwife: [employer]    Comment: [employer]  Social Needs   Emergency planning/management officer strain: Not on file   Food insecurity:    Worry: Not on file    Inability: Not on file   Transportation needs:    Medical: No    Non-medical: No  Tobacco Use   Smoking status: Never Smoker   Smokeless tobacco: Never  Used  Substance and Sexual Activity   Alcohol use: Not Currently    Alcohol/week: 0.0 standard drinks    Comment: RARE    Drug use: No    Comment: marijuana past   Sexual activity: Yes    Partners: Female  Lifestyle   Physical activity:    Days per week: Not on file    Minutes per session: Not on file   Stress: Not on file  Relationships   Social connections:    Talks on phone: Not on file    Gets together: Not on file    Attends religious service: Not on file    Active member of club or organization: Not on file    Attends meetings of clubs or organizations: Not on file    Relationship status: Not on file   Intimate partner violence:    Fear of current or ex partner: Not on file    Emotionally abused: Not on file    Physically abused: Not on file    Forced sexual activity: Not on file  Other Topics Concern   Not on file  Social History Narrative   Married to wife, [name]   Has one 18 year old child   Occupation: Emergency planning/management officer at [employer]    Review of Systems: Pertinent positive and negative review of systems were noted in the above HPI section.  All other review of systems was otherwise negative.  Physical Exam: Vital signs in last 24 hours: Temp:  [97.8 F (36.6 C)-98 F (36.7 C)] 98 F (36.7 C) (05/06 0511) Pulse Rate:  [77-90] 87 (05/06 0511) Resp:  [14-21] 18 (05/06 0511)  BP: (89-123)/(44-88) 101/66 (05/06 0511) SpO2:  [97 %-100 %] 99 % (05/06 0511) Weight:  [74.4 kg-74.7 kg] 74.4 kg (05/06 0500) Last BM Date: 07/24/18 General:   Alert,  Well-developed, ill-appearing thin jaundiced white male Head:  Normocephalic and atraumatic. Eyes:  Sclera icteric   conjunctiva pink. Ears:  Normal auditory acuity. Nose:  No deformity, discharge,  or lesions. Mouth:  No deformity or lesions.  Oral thrush present Neck:  Supple; no masses or thyromegaly. Lungs: Decreased breath sounds bases laterally. Heart:  Regular rate and rhythm; no murmurs, clicks,  rubs,  or gallops. Abdomen:  Soft,nontender, BS active, nonpalp mass or hsm.  Colostomy bag in left mid quadrant Rectal:  Deferred  Msk:  Symmetrical without gross deformities. . Pulses:  Normal pulses noted. Extremities: Bilateral lower extremity 2-3+ pitting edema Neurologic:  Alert and  oriented x4;  grossly normal neurologically. Skin:  Intact without significant lesions or rashes.. Psych:  Alert and cooperative. Normal mood and affect.  Intake/Output from previous day: 05/05 0701 - 05/06 0700 In: 1913.1 [I.V.:713.1; IV Piggyback:1200] Out: 300 [Urine:300] Intake/Output this shift: No intake/output data recorded.  Lab Results: Recent Labs    07/25/18 1933 07/26/18 0547  WBC 3.0* 2.4*  HGB 12.1* 10.5*  HCT 36.9* 32.1*  PLT 90* 70*   BMET Recent Labs    07/25/18 1517 07/26/18 0547  NA 131* 131*  K 3.9 4.1  CL 93* 96*  CO2 26 23  GLUCOSE 98 76  BUN 47* 44*  CREATININE 2.12* 1.45*  CALCIUM 8.7   8.7 8.5*   LFT Recent Labs    07/26/18 0547  PROT 5.0*  ALBUMIN 2.1*  AST 96*  ALT 46*  ALKPHOS 335*  BILITOT 32.1*  BILIDIR 17.9*  IBILI 14.2*   PT/INR Recent Labs    07/25/18 1933  LABPROT 22.4*  INR 2.0*      IMPRESSION:  #32 48 year old white male with widely metastatic rectal cancer involving the lungs, liver, and brain. Patient found to have malignant proximal biliary stricture when he presented with significant jaundice.  He underwent ERCP with stent placement on 07/19/2018 for malignant stricture of the right hepatic duct, and also had plastic stent placed to the pancreatic duct.  Unfortunately patient has had decline in overall status over the past week with increased lethargy, poor oral intake, intermittent nausea and vomiting and some complaints of abdominal pain it he has not had any documented fevers  Repeat labs yesterday showed significant rise in bilirubin as well is acute kidney injury and patient has been admitted.  Noncontrasted CT scan  shows the biliary stents to be in position, no biliary ductal dilation and no change in known metastatic disease.  Though stent appears to be in place on CT scan may have had some  migration.   Recommendation: Discussed with Dr. Benay Spice earlier this morning, patient is to be transitioned to hospice We will continue IV Cipro Full liquid diet today then n.p.o. after midnight Patient has been tentatively scheduled for ERCP with stent exchange and/or additional stent placement tomorrow at 10:30 AM.  Procedure was discussed with the patient in detail including indications risks and benefits and he is agreeable to proceed. Patient also had previously expressed that he did wish to proceed with any procedures this admission that may be to his benefit.   Amy EsterwoodPA-C  07/26/2018, 9:55 AM      Attending Physician Note   I have taken a history, examined the patient and reviewed the chart. I  agree with the Advanced Practitioner's note, impression and recommendations.   Unfortunate 48 year old male with widely metastatic rectal cancer involving lung, liver, brain. Admitted yesterday with overall decline with worsening biliary obstruction and AKI.   Biliary obstruction at CHD/right main hepatic duct per recent ERCP, hepatic failure due to tumor burden causing worsening jaundice or both. The biliary stent could be clogged by blood/sludge or not in optimal position for adequate biliary drainage. Given metastatic liver lesions and proximal biliary stricturing it could be difficult or not possible to provide adequate biliary drainage. Patients care discussed with Dr. Benay Spice this morning and the patient will be transferring to hospice care. Dr. Benay Spice discussed patients care with Dr. Rush Landmark. The patient expressed desire to attempt to improve jaundice, biliary drainage with ERCP if it is felt that it could be helpful. Review management with Drs. Ardis Hughs or Mansouraty to consider repeat ERCP stent  exchange or additional stent placement.    Lucio Edward, MD [facility] [phone]      Addendum- after further review of case and scans per Dr. Rush Landmark, he does not feel that repeat ERCP will offer patient any significant benefit.  Dr. Rush Landmark will discuss with the patient's wife this evening, and expect that we will cancel ERCP for tomorrow morning.  I have left a message for Dr. Benay Spice as well.  4:40 PM addendum After reviewing the recent CT scan without any new or progressive biliary ductal dilation and based on the patient's current medical status clinical status it is not likely that we have any further effective role for biliary decompression via ERCP.  Without biliary ductal dilation being available or significant on imaging studies as reviewed by radiology percutaneous approaches are unlikely as well.  Although there is no overt evidence of pneumobilia could be suggestive of some stent dysfunction likelihood of improving things at this point in time with his developing and organ dysfunction at this point is such that the risks of repeated ERCPs or procedures is not likely to be there.  I do not feel that repeating an ERCP is in the patient's best interest.  He is being transitioned to home with hospice.  After a long 25-minute discussion with the patient's wife as well as with the patient's mother they understand the concerns about the risk/benefit ratio of repeat procedures.  As well I have gone over some of the things that hospice would be helpful for her.  It looks like a palliative care consultation has been sent in place to be performed to help the patient's family understand the role of home with hospice.  That has not been completed as of yet.  Based on the patient's clinical trajectory I think his likelihood of survival course the next 1 to 2 weeks is significantly grave.  I was thankful for the opportunity to meet Mr. Dumlao and try and help him and our [facility] family is also  thankful for the opportunity to have known him and treated him the course the last few years.  We will cancel the ERCP for tomorrow.  Any further discussions about the role of any procedure to help the patient should be done with interventional radiology although, as discussed previously, without evidence of biliary ductal dilation to go after PBDs unlikely to be able to be performed.  The patient's wife and mother were appreciative of time and discussions today.

## 2018-07-26 NOTE — Progress Notes (Signed)
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  Spoke with wife to confirm and she was very confused.  She states no one has been updating her and she was not ready to consider hospice until she had been updated.  She states her husband is intermittently confused and she is worried he is consenting to procedures that he is not fully aware of.    Updated Dr. Alfredia Ferguson and Case Manager.  Palliative consult has been placed to assist with goals of care.    [facility] will continue to follow to assist with discharge planning.  Venia Carbon RN, BSN, [facility] Liaison (in [place]) [phone]

## 2018-07-26 NOTE — Progress Notes (Signed)
PROGRESS NOTE    Jacob [name]  OIZ:[id number] DOB: 1970/10/02 DOA: 07/25/2018 PCP: Mosie Lukes, MD   Brief Narrative: HPI per Dr. Zada Finders on 07/25/2018 Jacob [name] is a 48 y.o. 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   Patient underwent ERCP on 07/19/2018 by Dr. Rush Landmark which showed a single localized severe malignant appearing biliary stricture.  A biliary sphincterotomy was performed in the stenotic region was dilated.  A plastic biliary stent was placed into the right hepatic duct and 1 temporary plastic pancreatic stent was placed into the ventral pancreatic duct to decrease risk of post ERCP pancreatitis.  A nonbleeding duodenal ulcer with clean base was seen proximal to the ampulla.  Patient had continued symptoms as above and had follow-up lab work performed by his gastroenterologist this afternoon.  Labs are notable for worsening hyperbilirubinemia of 37.9, 18.1 direct bilirubin, alk phos 407, AST 101, ALT 46.  He was also noted to have worsening renal function with BUN 47, creatinine 2.12, GFR 33.57 compared to normal renal function 12 days ago.  He says he is having significant functional decline with poor oral intake and decreased colostomy output.  He has had swelling in both of his legs but reports decent urine output.  He has also been having dyspnea on exertion.  He noticed right peripheral vision loss several weeks ago.  He had MRI brain with and without contrast on 07/12/2018 which showed a new 8 mm metastasis in the right middle cerebellar peduncle and mildly increased size of all other lesions including a 5 cm left occipital lesion with worsening of  extensive edema.  He saw his radiation oncologist on 07/17/2018 recommend continuing dexamethasone 1 mg daily as well as Lamictal for seizures.  He was recommended to have radiosurgery for the new lesion for therapeutic and palliative affect.  He currently denies any chest pain, subjective fevers, chills, diaphoresis, diarrhea, dysuria.  ED Course:  Initial vitals showed BP 118/74, pulse 90, RR 18, temp 98.0 Fahrenheit, SPO2 97% on room air.  Labs are notable for WBC 3.0, hemoglobin 12.1, platelets 90,000, lipase 48, INR 2.0, lactic acid 1.5.  SARS-CoV-2 testing was negative.  Blood cultures were drawn and pending.  Urinalysis was negative for UTI.  Patient was given 1 L normal saline.  The EDP discussed the case with on-call lower GI, Dr. Carlean Purl, who recommended IV ciprofloxacin and make n.p.o. at midnight for likely stent removal tomorrow.  Ciprofloxacin was started and a CT abdomen/pelvis without contrast was obtained which showed biliary and pancreatic stents in place without evidence of biliary ductal dilatation.  Bilateral pulmonary metastasis in the lung bases reported stable with small left pleural effusion.  Metastases in the liver and right adrenal gland not significantly changed from prior as well as retroperitoneal adenopathy which is stable.  Free fluid in the pelvis and adjacent to the liver and spleen was seen and stable per radiology report.  The hospital service was consulted to admit for further evaluation management.  Interim History Oncology met with the patient and recommended continuing IV fluid hydration and recommended further evaluation management of hyperbilirubinemia per gastroenterology however gastroenterology has reviewed the case thoroughly and they do not feel that there is any benefit or  effective role for biliary decompression via ERCP and Dr. Rush Landmark does not feel that repeating an ERCP is in the patient's best interest and feels that palliative option is  the best course to take and recommending referral to hospice.  Palliative care has been also further consulted for further evaluation and goals of care.  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   -GI were consulted and are considering repeat ERCP and possible stent exchange or additional stenting however Dr. Melene Muller has evaluated the CT scans and without any new or progressive biliary ductal dilatation based on the patient's current medical status there is no effective role for biliary decompression via ERCP and without any biliary ductal dilatation being available or significant having radiology do a percutaneous approach is also unlikely and Dr. Stefani Dama ID feels that ERCP is not in the patient's best interest - May need further IR evaluation for possible percutaneous intervention but this is unlikely -Continue IV ciprofloxacin -Continue IV fluids overnight and will continue to monitor -Pain control and antiemetics as needed -N.p.o. at midnight now canceled Full liquid diet -Patient's oncologist, Dr. Benay Spice, is aware and the patient and now he is recommending transition to hospice at discharge -Palliative care has been consulted for further goals of care discussion  Metastatic rectal cancer metastatic to the liver, lungs, and brain: -Follows closely with oncology, radiation/oncology, and GI.  New brain metastasis with worsening edema seen on recent MRI. -Continue dexamethasone for brain lesion/edema -Continue Lamictal for seizure prophylaxis -Continue pain control as needed -Patient has been started on Diflucan for oral thrush -Patient  is currently on dronabinol 5 mg p.o. 3 times daily PRN for nausea -Medical oncology is now recommending hospice referral at discharge; pain control as outlined and will have palliative care to just -Palliative care has been consulted for further goals of care discussion -Gastroenterology evaluated and waiting and feel that ERCP is going to be of no benefit  Acute Kidney Injury; improving -Likely prerenal due to frequent GI losses. -Continued fluids overnight and monitor labs; Resumed Fluids but will resume at 100 mL per hour for 1 more day -Continue to monitor and trend renal function -Avoid nephrotoxic medications, contrast dyes, as well as hypotension if possible -BUN/Creatinine went from 47/2.12 is now 44/1.45 -Repeat CMP in a.m.  Pancytopenia -Patient's WBC on admission was 3.0, hemoglobin/hematocrit was 12.1/36.9, and platelet count was 90 -Now patient WBC is 2.4, hemoglobin/hematocrit is 10.5/32.1, and platelet count is 70,000 -Continue monitor and trend and repeat CBC in a.m.  Hyponatremia -Patient sodium is been 131 -Continue monitor and trend -C/w LR for now -Repeat CMP in AM   Oral Thrush -Continue with omeprazole 10 mg p.o. 4 times daily as needed for oral thrush started last night -Fluconazole 100 mg IV every 24 started as well  DVT prophylaxis: SCDs Code Status: DO NOT RESUSCITATE Family Communication: No family present at bedside Disposition Plan: Anticipating discharge home with home hospice  Consultants:   Gastroenterology  Medical Oncology   Procedures: None   Antimicrobials:  Anti-infectives (From admission, onward)   Start     Dose/Rate Route Frequency Ordered Stop   07/26/18 0800  ciprofloxacin (CIPRO) IVPB 400 mg  400 mg 200 mL/hr over 60 Minutes Intravenous Every 12 hours 07/25/18 2224     07/25/18 2015  ciprofloxacin (CIPRO) IVPB 400 mg     400 mg 200 mL/hr over 60 Minutes Intravenous  Once 07/25/18 2007 07/25/18 2143     Subjective:  Seen And examined at bedside his main complaint was that he was thirsty and wanting something to drink.  No nausea or vomiting but also complained of shortness of breath from not getting anything to drink.  Dr. Benay Spice recommended hospice but wanted to see what GI would say first and they are recommending no further interventions at this time so palliative care has been consulted for goals of care.  Patient denies any other concerns or complaints at this time  Objective: Vitals:   07/25/18 2200 07/25/18 2253 07/26/18 0500 07/26/18 0511  BP: 118/75 110/73  101/66  Pulse:  83  87  Resp: _0 Temp:  97.8 F (36.6 C)  98 F (36.7 C)  TempSrc:  Oral  Oral  SpO2:  100%  99%  Weight:  74.7 kg 74.4 kg   Height:  _1  (1.778 m)      Intake/Output Summary (Last 24 hours) at 07/26/2018 0757 Last data filed at 07/26/2018 0515 Gross per 24 hour  Intake 1913.14 ml  Output 300 ml  Net 1613.14 ml   Filed Weights   07/25/18 2253 07/26/18 0500  Weight: 74.7 kg 74.4 kg   Examination: Physical Exam:  Constitutional: Frail chronically ill appearing Caucasian NAD and appears calm but slightly uncomfortable Eyes: Lids and conjunctivae normal, sclerae anicteric  ENMT: External Ears, Nose appear normal. Grossly normal hearing.  Neck: Appears normal, supple, no cervical masses, normal ROM, no appreciable thyromegaly; no JVD Respiratory: Diminished to auscultation bilaterally, no wheezing, rales, rhonchi or crackles. Normal respiratory effort and patient is not tachypenic. No accessory muscle use.  Cardiovascular: RRR, no murmurs / rubs / gallops. S1 and S2 auscultated. Trace extremity edema.  Abdomen: Soft, Tender to palpate, non-distended. No masses palpated. No appreciable hepatosplenomegaly. Bowel sounds positive x4.  GU: Deferred. Musculoskeletal: No clubbing / cyanosis of digits/nails. No joint deformity upper and lower extremities. Skin: No rashes, lesions, ulcers on a limited skin  evaluation. No induration; Warm and dry.  Neurologic: CN 2-12 grossly intact with no focal deficits.. Romberg sign and cerebellar reflexes not assessed.  Psychiatric: Normal judgment and insight. Alert and awake. Anxious mood and appropriate affect.   Data Reviewed: I have personally reviewed following labs and imaging studies  CBC: Recent Labs  Lab 07/25/18 1933 07/26/18 0547  WBC 3.0* 2.4*  NEUTROABS 2.0  --   HGB 12.1* 10.5*  HCT 36.9* 32.1*  MCV 90.7 90.4  PLT 90* 70*   Basic Metabolic Panel: Recent Labs  Lab 07/25/18 1517 07/26/18 0547  NA 131* 131*  K 3.9 4.1  CL 93* 96*  CO2 26 23  GLUCOSE 98 76  BUN 47* 44*  CREATININE 2.12* 1.45*  CALCIUM 8.7  8.7 8.5*   GFR: Estimated Creatinine Clearance: 65 mL/min (A) (by C-G formula based on SCr of 1.45 mg/dL (H)). Liver Function Tests: Recent Labs  Lab 07/25/18 1334 07/26/18 0547  AST 101* 96*  ALT 46 46*  ALKPHOS 407* 335*  BILITOT 37.9* 32.1*  PROT 4.9* 5.0*  ALBUMIN 3.0* 2.1*   Recent Labs  Lab 07/25/18 1933  LIPASE 48   No results for input(s): AMMONIA in the last 168 hours. Coagulation Profile: Recent Labs  Lab 07/25/18 1933  INR 2.0*   Cardiac Enzymes: No results for input(s): CKTOTAL, CKMB, CKMBINDEX, TROPONINI in the last 168 hours. BNP (last 3 results) No results for input(s): PROBNP in the last 8760 hours. HbA1C: No results for input(s): HGBA1C in the last 72 hours. CBG: No results for input(s): GLUCAP in the last 168 hours. Lipid Profile: No results for input(s): CHOL, HDL, LDLCALC, TRIG, CHOLHDL, LDLDIRECT in the last 72 hours. Thyroid Function Tests: No results for input(s): TSH, T4TOTAL, FREET4, T3FREE, THYROIDAB in the last 72 hours. Anemia Panel: No results for input(s): VITAMINB12, FOLATE, FERRITIN, TIBC, IRON, RETICCTPCT in the last 72 hours. Sepsis Labs: Recent Labs  Lab 07/25/18 1933 07/25/18 2303  LATICACIDVEN 1.5 1.7    Recent Results (from the past 240 hour(s))   SARS Coronavirus 2 (CEPHEID- Performed in [facility] hospital lab), Hosp Order     Status: None   Collection Time: 07/25/18  7:33 PM  Result Value Ref Range Status   SARS Coronavirus 2 NEGATIVE NEGATIVE Final    Comment: (NOTE) If result is NEGATIVE SARS-CoV-2 target nucleic acids are NOT DETECTED. The SARS-CoV-2 RNA is generally detectable in upper and lower  respiratory specimens during the acute phase of infection. The lowest  concentration of SARS-CoV-2 viral copies this assay can detect is 250  copies / mL. A negative result does not preclude SARS-CoV-2 infection  and should not be used as the sole basis for treatment or other  patient management decisions.  A negative result may occur with  improper specimen collection / handling, submission of specimen other  than nasopharyngeal swab, presence of viral mutation(s) within the  areas targeted by this assay, and inadequate number of viral copies  (<250 copies / mL). A negative result must be combined with clinical  observations, patient history, and epidemiological information. If result is POSITIVE SARS-CoV-2 target nucleic acids are DETECTED. The SARS-CoV-2 RNA is generally detectable in upper and lower  respiratory specimens dur ing the acute phase of infection.  Positive  results are indicative of active infection with SARS-CoV-2.  Clinical  correlation with patient history and other diagnostic information is  necessary to determine patient infection status.  Positive results do  not rule out bacterial infection or co-infection with other viruses. If result is PRESUMPTIVE POSTIVE SARS-CoV-2 nucleic acids MAY BE PRESENT.   A presumptive positive result was obtained on the submitted specimen  and confirmed on repeat testing.  While 2019 novel coronavirus  (SARS-CoV-2) nucleic acids may be present in the submitted sample  additional confirmatory testing may be necessary for epidemiological  and / or clinical management  purposes  to differentiate between  SARS-CoV-2 and other Sarbecovirus currently known to infect humans.  If clinically indicated additional testing with an alternate test  methodology [phone]) is advised. The SARS-CoV-2 RNA is generally  detectable in upper and lower respiratory sp ecimens during the acute  phase of infection. The expected result is Negative. Fact Sheet for Patients:  StrictlyIdeas.no Fact Sheet for Healthcare Providers: BankingDealers.co.za This test is not yet approved or cleared by the [place] FDA and has been authorized for detection and/or diagnosis of SARS-CoV-2 by FDA under an Emergency Use Authorization (EUA).  This EUA will remain in effect (meaning this test can be used) for the duration of the COVID-19 declaration under Section 564(b)(1) of the Act, 21 U.S.C. section 360bbb-3(b)(1), unless the authorization is terminated or revoked sooner. Performed at [facility], [place] [address] [place], [place] [address]  Culture, blood (routine x 2)     Status: None (Preliminary result)   Collection Time: 07/25/18  7:34 PM  Result Value Ref Range Status   Specimen Description   Final    BLOOD LEFT HAND Performed at [facility] [address] [place], [place] [address]    Special Requests   Final    BOTTLES DRAWN AEROBIC AND ANAEROBIC Blood Culture adequate volume Performed at [facility] [address] [place], [place] [address]    Culture   Final    NO GROWTH < 12 HOURS Performed at [facility] [address] [place], [place] [address]    Report Status PENDING  Incomplete  Culture, blood (routine x 2)     Status: None (Preliminary result)   Collection Time: 07/25/18  7:34 PM  Result Value Ref Range Status   Specimen Description   Final    BLOOD RIGHT FOREARM Performed at [facility] [address]  [place], [place] [address]    Special Requests   Final    BOTTLES DRAWN AEROBIC AND ANAEROBIC Blood Culture results may not be optimal due to an inadequate volume of blood received in culture bottles Performed at [facility] [address] [place], [place] [address]    Culture   Final    NO GROWTH < 12 HOURS Performed at [facility] [address] [place], [place] [address]    Report Status PENDING  Incomplete    Radiology Studies: Ct Abdomen Pelvis Wo Contrast  Result Date: 07/25/2018 CLINICAL DATA:  Metastatic rectal cancer. Jaundice, elevated bilirubin. EXAM: CT ABDOMEN AND PELVIS WITHOUT CONTRAST TECHNIQUE: Multidetector CT imaging of the abdomen and pelvis was performed following the standard protocol without IV contrast. COMPARISON:  07/12/2018 FINDINGS: Lower chest: Bilateral metastases seen within the lung bases, stable since recent study. Small left pleural effusion. Heart is normal size. Hepatobiliary: Prior partial resection of the liver. Plastic stent is noted in place within the common bile duct, extending into the posterior right liver. Low-density masses noted within the liver, likely not significantly changed, better seen on prior contrast enhanced study. No biliary ductal dilatation. Pancreas: Pancreatic ductal stent noted in the pancreatic head. No ductal dilatation. Spleen: No focal abnormality.  Normal size. Adrenals/Urinary Tract: Right adrenal mass measures up to 2.6 cm with central high-density areas, likely calcifications, stable. No visible focal renal mass. Urinary bladder unremarkable. Stomach/Bowel: Left lower quadrant ostomy. No evidence of bowel obstruction. Postoperative changes in the pelvis from abdominal perineal resection. Presacral soft tissue thickening, stable. Vascular/Lymphatic: Retroperitoneal adenopathy in the periaortic regions, stable. Reproductive: No visible focal abnormality there is. Other: Free fluid noted within the pelvis,  stable. Small amount of free fluid adjacent to the liver and spleen, stable. 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 No acute bony abnormality. IMPRESSION: Bilateral pulmonary metastases in the lung bases, stable. Small left pleural effusion. Biliary and pancreatic stents in place. No evidence of biliary ductal dilatation. Metastases in the liver and right adrenal gland not significantly changed. Retroperitoneal adenopathy, stable. Free fluid in the pelvis and adjacent to the liver and spleen, stable. Electronically Signed   By: Rolm Baptise M.D.   On: 07/25/2018 22:42   Dg Abd 1 View  Result Date: 07/25/2018 CLINICAL DATA:  Right upper quadrant pain since ERCP 07/19/2018. EXAM: ABDOMEN - 1 VIEW COMPARISON:  Images from ERCP 07/19/2018. FINDINGS: Common bile duct and pancreatic  stents are in place. The patient is status post cholecystectomy. Left lower quadrant ostomy is identified. The bowel gas pattern is normal. No radio-opaque calculi or other significant radiographic abnormality are seen. IMPRESSION: No acute abnormality. Electronically Signed   By: Inge Rise M.D.   On: 07/25/2018 16:38   Dg Chest Port 1 View  Result Date: 07/25/2018 CLINICAL DATA:  Cough EXAM: PORTABLE CHEST 1 VIEW COMPARISON:  Chest x-ray dated 07/19/2018. CT chest dated 04/19/2018 FINDINGS: A left subclavian Port-A-Cath is again noted. This is well positioned. The right lung field is mostly clear with a few scattered opacities. Persistent elevation of the left hemidiaphragm with left-sided volume loss is noted. Hazy airspace opacities are again noted throughout the left lung field. There may be a more focal opacity medial to the Port-A-Cath. No pneumothorax. IMPRESSION: 1. Grossly stable appearance of the chest with diffuse hazy airspace opacities throughout the left lung field with mild to moderate left-sided volume loss. 2. Stable left subclavian Port-A-Cath. Electronically  Signed   By: Constance Holster M.D.   On: 07/25/2018 19:36   Scheduled Meds: . dexamethasone  1 mg Oral Daily  . lamoTRIgine  100 mg Oral BID  . pantoprazole  40 mg Oral Daily  . senna-docusate  1 tablet Oral BID   Continuous Infusions: . ciprofloxacin    . lactated ringers 125 mL/hr at 07/26/18 0515    LOS: 1 day   Kerney Elbe, DO [facility] PAGER is on [place]  If 7PM-7AM, please contact night-coverage [email or web address] Password [email or web address] 07/26/2018, 7:57 AM

## 2018-07-26 NOTE — Progress Notes (Signed)
Patient called for nurse stating that he was having difficulty breathing. Nurse entered room to find patient in no respiratory distress. O2 saturation was 98% respirations symmetrical and unlabored. Patient states that the SOB gets worse with movement. Nurse asked patient had he been trying to move and patient responds that the SOB has been worse since he was unable to drink fluids. Current diet is NPO patient offered sips with meds. Dr Alfredia Ferguson on unit and made aware, Patient currently in no distress. Will continue to monitor.

## 2018-07-26 NOTE — Progress Notes (Addendum)
HEMATOLOGY-ONCOLOGY PROGRESS NOTE  SUBJECTIVE: Jacob [name] is a 48 year old male with metastatic rectal cancer.  The patient was most recently on Lonsurf which was discontinued secondary to abnormal liver enzymes.  The patient has been admitted to the hospital with abdominal pain, nausea, vomiting, and generalized weakness.  Initial work-up in the emergency room was significant for mild thrombocytopenia a significantly elevated total bilirubin at 37.9 with a direct bilirubin of 18.1.  Creatinine elevated at 2.12 with an elevated BUN at 47.  He also has mild pancytopenia.  A CT of the abdomen pelvis was performed without contrast which overall showed stable disease.  His biliary and pancreatic stents are in place without evidence of biliary ductal dilatation.  Blood cultures negative to date and urine culture negative.  When seen this morning, the patient reports that he feels very tired.  He wants to have something to drink.  He is currently n.p.o.  He reports back pain but no other pain.  Reports intermittent nausea and vomiting.  He has no other complaints this morning.    Malignant neoplasm of rectum (HCC)   03/17/2012 Initial Diagnosis    Malignant neoplasm of rectum ([place])    03/20/2012 - 04/27/2012 Radiation Therapy    RT for 28 Fractions w / Xeloda     04/30/2015 - 01/18/2018 Chemotherapy    The patient had palonosetron (ALOXI) injection 0.25 mg, 0.25 mg, Intravenous,  Once, 30 of 32 cycles Administration: 0.25 mg (05/21/2015), 0.25 mg (06/05/2015), 0.25 mg (06/19/2015), 0.25 mg (07/10/2015), 0.25 mg (07/24/2015), 0.25 mg (08/28/2015), 0.25 mg (09/11/2015), 0.25 mg (09/25/2015), 0.25 mg (10/09/2015), 0.25 mg (10/23/2015), 0.25 mg (11/04/2016), 0.25 mg (11/18/2016), 0.25 mg (12/02/2016), 0.25 mg (12/15/2016), 0.25 mg (12/30/2016), 0.25 mg (01/13/2017), 0.25 mg (01/27/2017), 0.25 mg (02/17/2017), 0.25 mg (03/03/2017), 0.25 mg (03/24/2017), 0.25 mg (04/07/2017), 0.25 mg (04/21/2017), 0.25 mg (05/05/2017), 0.25 mg (08/19/2017),  0.25 mg (09/01/2017), 0.25 mg (11/03/2017), 0.25 mg (11/17/2017), 0.25 mg (12/08/2017), 0.25 mg (12/22/2017), 0.25 mg (01/05/2018) pegfilgrastim (NEULASTA) injection 6 mg, 6 mg, Subcutaneous, Once, 23 of 23 cycles Administration: 6 mg (05/23/2015), 6 mg (06/07/2015), 6 mg (06/21/2015), 6 mg (07/12/2015), 6 mg (07/26/2015), 6 mg (08/30/2015), 6 mg (09/13/2015), 6 mg (09/27/2015), 6 mg (10/11/2015), 6 mg (10/25/2015), 6 mg (11/06/2016), 6 mg (11/20/2016), 6 mg (12/04/2016), 6 mg (12/17/2016), 6 mg (01/01/2017), 6 mg (01/15/2017), 6 mg (01/29/2017), 6 mg (02/19/2017), 6 mg (03/05/2017), 6 mg (03/26/2017), 6 mg (04/09/2017), 6 mg (04/23/2017), 6 mg (05/07/2017) pegfilgrastim-cbqv (UDENYCA) injection 6 mg, 6 mg, Subcutaneous, Once, 6 of 8 cycles Administration: 6 mg (08/22/2017), 6 mg (11/05/2017), 6 mg (11/19/2017), 6 mg (12/10/2017), 6 mg (12/24/2017), 6 mg (01/07/2018) irinotecan (CAMPTOSAR) 258 mg in dextrose 5 % 500 mL chemo infusion, 135 mg/m2 = 258 mg (75 % of original dose 180 mg/m2), Intravenous,  Once, 30 of 32 cycles Dose modification: 135 mg/m2 (original dose 180 mg/m2, Cycle 1, Reason: Provider Judgment), 100 mg/m2 (original dose 180 mg/m2, Cycle 24, Reason: Provider Judgment) Administration: 258 mg (05/21/2015), 260 mg (06/05/2015), 258 mg (06/19/2015), 258 mg (07/10/2015), 260 mg (07/24/2015), 260 mg (08/28/2015), 260 mg (09/11/2015), 260 mg (09/25/2015), 260 mg (10/23/2015), 260 mg (11/04/2016), 240 mg (11/18/2016), 240 mg (12/02/2016), 240 mg (12/15/2016), 240 mg (12/30/2016), 240 mg (01/13/2017), 240 mg (01/27/2017), 240 mg (02/17/2017), 240 mg (03/03/2017), 240 mg (03/24/2017), 240 mg (04/07/2017), 240 mg (04/21/2017), 240 mg (05/05/2017), 180 mg (08/19/2017), 180 mg (09/01/2017), 180 mg (11/03/2017), 180 mg (11/17/2017), 180 mg (12/08/2017), 180 mg (12/22/2017), 180 mg (01/05/2018) leucovorin 764 mg  in dextrose 5 % 250 mL infusion, 400 mg/m2 = 764 mg, Intravenous,  Once, 30 of 32 cycles Administration: 764 mg (05/21/2015), 764 mg (06/05/2015), 764 mg  (06/19/2015), 764 mg (07/10/2015), 760 mg (07/24/2015), 760 mg (08/28/2015), 760 mg (09/11/2015), 760 mg (09/25/2015), 764 mg (10/09/2015), 764 mg (10/23/2015), 764 mg (11/04/2016), 764 mg (11/18/2016), 764 mg (12/02/2016), 764 mg (12/15/2016), 716 mg (12/30/2016), 716 mg (01/13/2017), 716 mg (01/27/2017), 716 mg (02/17/2017), 716 mg (03/03/2017), 716 mg (03/24/2017), 716 mg (04/07/2017), 716 mg (04/21/2017), 716 mg (05/05/2017), 736 mg (08/19/2017), 736 mg (09/01/2017), 736 mg (11/03/2017), 736 mg (11/17/2017), 736 mg (12/08/2017), 736 mg (12/22/2017), 736 mg (01/05/2018) fluorouracil (ADRUCIL) chemo injection 750 mg, 400 mg/m2 = 750 mg, Intravenous,  Once, 10 of 10 cycles Administration: 750 mg (05/21/2015), 750 mg (06/05/2015), 750 mg (06/19/2015), 750 mg (07/10/2015), 750 mg (07/24/2015), 750 mg (08/28/2015), 750 mg (09/11/2015), 750 mg (09/25/2015), 750 mg (10/09/2015), 750 mg (10/23/2015) leucovorin injection 38 mg, 20 mg/m2 = 38 mg, Intravenous,  Once, 1 of 1 cycle fosaprepitant (EMEND) 150 mg, dexamethasone (DECADRON) 12 mg in sodium chloride 0.9 % 145 mL IVPB, , Intravenous,  Once, 30 of 32 cycles Administration:  (05/21/2015),  (06/05/2015),  (06/19/2015),  (07/10/2015),  (07/24/2015),  (08/28/2015),  (09/11/2015),  (09/25/2015),  (10/09/2015),  (10/23/2015),  (11/04/2016),  (11/18/2016),  (12/02/2016),  (12/15/2016),  (12/30/2016),  (01/13/2017),  (01/27/2017),  (02/17/2017),  (03/03/2017),  (03/24/2017),  (04/07/2017),  (04/21/2017),  (05/05/2017),  (08/19/2017),  (09/01/2017),  (11/03/2017),  (11/17/2017),  (12/08/2017),  (12/22/2017),  (01/05/2018) fluorouracil (ADRUCIL) 4,600 mg in sodium chloride 0.9 % 58 mL chemo infusion, 2,400 mg/m2 = 4,600 mg, Intravenous, 1 Day/Dose, 30 of 32 cycles Administration: 4,600 mg (05/21/2015), 4,600 mg (06/05/2015), 4,600 mg (06/19/2015), 4,600 mg (07/10/2015), 4,600 mg (07/24/2015), 4,600 mg (08/28/2015), 4,600 mg (09/11/2015), 4,600 mg (09/25/2015), 4,600 mg (10/09/2015), 4,600 mg (10/23/2015), 4,600 mg (11/04/2016), 4,600 mg (11/18/2016), 4,600 mg  (12/02/2016), 4,600 mg (12/15/2016), 4,300 mg (12/30/2016), 4,300 mg (01/13/2017), 4,300 mg (01/27/2017), 4,300 mg (02/17/2017), 4,300 mg (03/03/2017), 4,300 mg (03/24/2017), 4,300 mg (04/07/2017), 4,300 mg (04/21/2017), 4,300 mg (05/05/2017), 4,300 mg (08/19/2017), 4,400 mg (09/01/2017), 4,400 mg (11/03/2017), 4,400 mg (11/17/2017), 4,400 mg (12/08/2017), 4,400 mg (12/22/2017), 4,400 mg (01/05/2018)  for chemotherapy treatment.     02/02/2018 -  Chemotherapy    The patient had palonosetron (ALOXI) injection 0.25 mg, 0.25 mg, Intravenous,  Once, 4 of 6 cycles Administration: 0.25 mg (02/02/2018), 0.25 mg (02/24/2018), 0.25 mg (03/09/2018), 0.25 mg (03/28/2018) pegfilgrastim-cbqv (UDENYCA) injection 6 mg, 6 mg, Subcutaneous, Once, 3 of 5 cycles Administration: 6 mg (03/11/2018), 6 mg (03/30/2018) leucovorin 728 mg in dextrose 5 % 250 mL infusion, 400 mg/m2 = 728 mg, Intravenous,  Once, 4 of 6 cycles Administration: 728 mg (02/02/2018), 728 mg (02/24/2018), 728 mg (03/09/2018), 728 mg (03/28/2018) oxaliplatin (ELOXATIN) 150 mg in dextrose 5 % 500 mL chemo infusion, 82 mg/m2 = 155 mg, Intravenous,  Once, 4 of 6 cycles Dose modification: 60 mg/m2 (original dose 85 mg/m2, Cycle 2, Reason: Provider Judgment) Administration: 150 mg (02/02/2018), 110 mg (02/24/2018), 110 mg (03/09/2018), 110 mg (03/28/2018) fluorouracil (ADRUCIL) 4,350 mg in sodium chloride 0.9 % 63 mL chemo infusion, 2,400 mg/m2 = 4,350 mg, Intravenous, 1 Day/Dose, 4 of 6 cycles Administration: 4,350 mg (02/02/2018), 4,350 mg (02/24/2018), 4,350 mg (03/09/2018), 4,350 mg (03/28/2018)  for chemotherapy treatment.      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[place])   09/17/2013 Initial Diagnosis    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[facility])    04/30/2015 -  01/18/2018 Chemotherapy    The patient had palonosetron (ALOXI) injection 0.25 mg, 0.25 mg, Intravenous,  Once, 30 of 32 cycles Administration: 0.25 mg (05/21/2015), 0.25 mg (06/05/2015), 0.25 mg (06/19/2015), 0.25 mg  (07/10/2015), 0.25 mg (07/24/2015), 0.25 mg (08/28/2015), 0.25 mg (09/11/2015), 0.25 mg (09/25/2015), 0.25 mg (10/09/2015), 0.25 mg (10/23/2015), 0.25 mg (11/04/2016), 0.25 mg (11/18/2016), 0.25 mg (12/02/2016), 0.25 mg (12/15/2016), 0.25 mg (12/30/2016), 0.25 mg (01/13/2017), 0.25 mg (01/27/2017), 0.25 mg (02/17/2017), 0.25 mg (03/03/2017), 0.25 mg (03/24/2017), 0.25 mg (04/07/2017), 0.25 mg (04/21/2017), 0.25 mg (05/05/2017), 0.25 mg (08/19/2017), 0.25 mg (09/01/2017), 0.25 mg (11/03/2017), 0.25 mg (11/17/2017), 0.25 mg (12/08/2017), 0.25 mg (12/22/2017), 0.25 mg (01/05/2018) pegfilgrastim (NEULASTA) injection 6 mg, 6 mg, Subcutaneous, Once, 23 of 23 cycles Administration: 6 mg (05/23/2015), 6 mg (06/07/2015), 6 mg (06/21/2015), 6 mg (07/12/2015), 6 mg (07/26/2015), 6 mg (08/30/2015), 6 mg (09/13/2015), 6 mg (09/27/2015), 6 mg (10/11/2015), 6 mg (10/25/2015), 6 mg (11/06/2016), 6 mg (11/20/2016), 6 mg (12/04/2016), 6 mg (12/17/2016), 6 mg (01/01/2017), 6 mg (01/15/2017), 6 mg (01/29/2017), 6 mg (02/19/2017), 6 mg (03/05/2017), 6 mg (03/26/2017), 6 mg (04/09/2017), 6 mg (04/23/2017), 6 mg (05/07/2017) pegfilgrastim-cbqv (UDENYCA) injection 6 mg, 6 mg, Subcutaneous, Once, 6 of 8 cycles Administration: 6 mg (08/22/2017), 6 mg (11/05/2017), 6 mg (11/19/2017), 6 mg (12/10/2017), 6 mg (12/24/2017), 6 mg (01/07/2018) irinotecan (CAMPTOSAR) 258 mg in dextrose 5 % 500 mL chemo infusion, 135 mg/m2 = 258 mg (75 % of original dose 180 mg/m2), Intravenous,  Once, 30 of 32 cycles Dose modification: 135 mg/m2 (original dose 180 mg/m2, Cycle 1, Reason: Provider Judgment), 100 mg/m2 (original dose 180 mg/m2, Cycle 24, Reason: Provider Judgment) Administration: 258 mg (05/21/2015), 260 mg (06/05/2015), 258 mg (06/19/2015), 258 mg (07/10/2015), 260 mg (07/24/2015), 260 mg (08/28/2015), 260 mg (09/11/2015), 260 mg (09/25/2015), 260 mg (10/23/2015), 260 mg (11/04/2016), 240 mg (11/18/2016), 240 mg (12/02/2016), 240 mg (12/15/2016), 240 mg (12/30/2016), 240 mg (01/13/2017), 240 mg (01/27/2017), 240 mg  (02/17/2017), 240 mg (03/03/2017), 240 mg (03/24/2017), 240 mg (04/07/2017), 240 mg (04/21/2017), 240 mg (05/05/2017), 180 mg (08/19/2017), 180 mg (09/01/2017), 180 mg (11/03/2017), 180 mg (11/17/2017), 180 mg (12/08/2017), 180 mg (12/22/2017), 180 mg (01/05/2018) leucovorin 764 mg in dextrose 5 % 250 mL infusion, 400 mg/m2 = 764 mg, Intravenous,  Once, 30 of 32 cycles Administration: 764 mg (05/21/2015), 764 mg (06/05/2015), 764 mg (06/19/2015), 764 mg (07/10/2015), 760 mg (07/24/2015), 760 mg (08/28/2015), 760 mg (09/11/2015), 760 mg (09/25/2015), 764 mg (10/09/2015), 764 mg (10/23/2015), 764 mg (11/04/2016), 764 mg (11/18/2016), 764 mg (12/02/2016), 764 mg (12/15/2016), 716 mg (12/30/2016), 716 mg (01/13/2017), 716 mg (01/27/2017), 716 mg (02/17/2017), 716 mg (03/03/2017), 716 mg (03/24/2017), 716 mg (04/07/2017), 716 mg (04/21/2017), 716 mg (05/05/2017), 736 mg (08/19/2017), 736 mg (09/01/2017), 736 mg (11/03/2017), 736 mg (11/17/2017), 736 mg (12/08/2017), 736 mg (12/22/2017), 736 mg (01/05/2018) fluorouracil (ADRUCIL) chemo injection 750 mg, 400 mg/m2 = 750 mg, Intravenous,  Once, 10 of 10 cycles Administration: 750 mg (05/21/2015), 750 mg (06/05/2015), 750 mg (06/19/2015), 750 mg (07/10/2015), 750 mg (07/24/2015), 750 mg (08/28/2015), 750 mg (09/11/2015), 750 mg (09/25/2015), 750 mg (10/09/2015), 750 mg (10/23/2015) leucovorin injection 38 mg, 20 mg/m2 = 38 mg, Intravenous,  Once, 1 of 1 cycle fosaprepitant (EMEND) 150 mg, dexamethasone (DECADRON) 12 mg in sodium chloride 0.9 % 145 mL IVPB, , Intravenous,  Once, 30 of 32 cycles Administration:  (05/21/2015),  (06/05/2015),  (06/19/2015),  (07/10/2015),  (07/24/2015),  (08/28/2015),  (09/11/2015),  (09/25/2015),  (10/09/2015),  (10/23/2015),  (11/04/2016),  (11/18/2016),  (  12/02/2016),  (12/15/2016),  (12/30/2016),  (01/13/2017),  (01/27/2017),  (02/17/2017),  (03/03/2017),  (03/24/2017),  (04/07/2017),  (04/21/2017),  (05/05/2017),  (08/19/2017),  (09/01/2017),  (11/03/2017),  (11/17/2017),  (12/08/2017),  (12/22/2017),   (01/05/2018) fluorouracil (ADRUCIL) 4,600 mg in sodium chloride 0.9 % 58 mL chemo infusion, 2,400 mg/m2 = 4,600 mg, Intravenous, 1 Day/Dose, 30 of 32 cycles Administration: 4,600 mg (05/21/2015), 4,600 mg (06/05/2015), 4,600 mg (06/19/2015), 4,600 mg (07/10/2015), 4,600 mg (07/24/2015), 4,600 mg (08/28/2015), 4,600 mg (09/11/2015), 4,600 mg (09/25/2015), 4,600 mg (10/09/2015), 4,600 mg (10/23/2015), 4,600 mg (11/04/2016), 4,600 mg (11/18/2016), 4,600 mg (12/02/2016), 4,600 mg (12/15/2016), 4,300 mg (12/30/2016), 4,300 mg (01/13/2017), 4,300 mg (01/27/2017), 4,300 mg (02/17/2017), 4,300 mg (03/03/2017), 4,300 mg (03/24/2017), 4,300 mg (04/07/2017), 4,300 mg (04/21/2017), 4,300 mg (05/05/2017), 4,300 mg (08/19/2017), 4,400 mg (09/01/2017), 4,400 mg (11/03/2017), 4,400 mg (11/17/2017), 4,400 mg (12/08/2017), 4,400 mg (12/22/2017), 4,400 mg (01/05/2018)  for chemotherapy treatment.     02/02/2018 -  Chemotherapy    The patient had palonosetron (ALOXI) injection 0.25 mg, 0.25 mg, Intravenous,  Once, 4 of 6 cycles Administration: 0.25 mg (02/02/2018), 0.25 mg (02/24/2018), 0.25 mg (03/09/2018), 0.25 mg (03/28/2018) pegfilgrastim-cbqv (UDENYCA) injection 6 mg, 6 mg, Subcutaneous, Once, 3 of 5 cycles Administration: 6 mg (03/11/2018), 6 mg (03/30/2018) leucovorin 728 mg in dextrose 5 % 250 mL infusion, 400 mg/m2 = 728 mg, Intravenous,  Once, 4 of 6 cycles Administration: 728 mg (02/02/2018), 728 mg (02/24/2018), 728 mg (03/09/2018), 728 mg (03/28/2018) oxaliplatin (ELOXATIN) 150 mg in dextrose 5 % 500 mL chemo infusion, 82 mg/m2 = 155 mg, Intravenous,  Once, 4 of 6 cycles Dose modification: 60 mg/m2 (original dose 85 mg/m2, Cycle 2, Reason: Provider Judgment) Administration: 150 mg (02/02/2018), 110 mg (02/24/2018), 110 mg (03/09/2018), 110 mg (03/28/2018) fluorouracil (ADRUCIL) 4,350 mg in sodium chloride 0.9 % 63 mL chemo infusion, 2,400 mg/m2 = 4,350 mg, Intravenous, 1 Day/Dose, 4 of 6 cycles Administration: 4,350 mg (02/02/2018), 4,350 mg  (02/24/2018), 4,350 mg (03/09/2018), 4,350 mg (03/28/2018)  for chemotherapy treatment.       REVIEW OF SYSTEMS:   Constitutional: Reports generalized weakness and fatigue.  Denies fevers and chills. Eyes: Reports yellowing of his eyes. Ears, nose, mouth, throat, and face: Denies mucositis or sore throat Respiratory: Denies cough, dyspnea or wheezes Cardiovascular: Denies palpitation, chest discomfort Gastrointestinal: Reports nausea and vomiting.  Denies diarrhea. Skin: Denies abnormal skin rashes Lymphatics: Denies new lymphadenopathy or easy bruising Neurological:Denies numbness, tingling or focal weaknesses MSK: Reports back pain. Behavioral/Psych: Mood is stable, no new changes  Extremities: No lower extremity edema All other systems were reviewed with the patient and are negative.  I have reviewed the past medical history, past surgical history, social history and family history with the patient and they are unchanged from previous note.   PHYSICAL EXAMINATION:  Vitals:   07/25/18 2253 07/26/18 0511  BP: 110/73 101/66  Pulse: 83 87  Resp: 18 18  Temp: 97.8 F (36.6 C) 98 F (36.7 C)  SpO2: 100% 99%   Filed Weights   07/25/18 2253 07/26/18 0500  Weight: 164 lb 10.9 oz (74.7 kg) 164 lb 0.4 oz (74.4 kg)    Intake/Output from previous day: 05/05 0701 - 05/06 0700 In: 1913.1 [I.V.:713.1; IV Piggyback:1200] Out: 300 [Urine:300]  GENERAL:alert, cachectic SKIN: skin color, texture, turgor are normal, no rashes or significant lesions EYES: Jaundice noted. OROPHARYNX: Thrush noted on tongue. NECK: supple, thyroid normal size, non-tender, without nodularity LYMPH:  no palpable lymphadenopathy in the cervical, axillary or inguinal LUNGS:  clear to auscultation and percussion with normal breathing effort HEART: regular rate & rhythm and no murmurs.  2+ pitting edema to the bilateral lower extremities. ABDOMEN: Colostomy in place.  Bowel sounds  diminished. Musculoskeletal:no cyanosis of digits and no clubbing  NEURO: alert & oriented x 3 with fluent speech, no focal motor/sensory deficits  LABORATORY DATA:  I have reviewed the data as listed CMP Latest Ref Rng & Units 07/26/2018 07/25/2018 07/25/2018  Glucose 70 - 99 mg/dL 76 98 -  BUN 6 - 20 mg/dL 44(H) 47(H) -  Creatinine 0.61 - 1.24 mg/dL 1.45(H) 2.12(H) -  Sodium 135 - 145 mmol/L 131(L) 131(L) -  Potassium 3.5 - 5.1 mmol/L 4.1 3.9 -  Chloride 98 - 111 mmol/L 96(L) 93(L) -  CO2 22 - 32 mmol/L 23 26 -  Calcium 8.9 - 10.3 mg/dL 8.5(L) 8.7 8.7  Total Protein 6.5 - 8.1 g/dL 5.0(L) - -  Total Bilirubin 0.3 - 1.2 mg/dL 32.1(HH) - -  Alkaline Phos 38 - 126 U/L 335(H) - -  AST 15 - 41 U/L 96(H) - -  ALT 0 - 44 U/L 46(H) - -    Lab Results  Component Value Date   WBC 2.4 (L) 07/26/2018   HGB 10.5 (L) 07/26/2018   HCT 32.1 (L) 07/26/2018   MCV 90.4 07/26/2018   PLT 70 (L) 07/26/2018   NEUTROABS 2.0 07/25/2018    Ct Abdomen Pelvis Wo Contrast  Result Date: 07/25/2018 CLINICAL DATA:  Metastatic rectal cancer. Jaundice, elevated bilirubin. EXAM: CT ABDOMEN AND PELVIS WITHOUT CONTRAST TECHNIQUE: Multidetector CT imaging of the abdomen and pelvis was performed following the standard protocol without IV contrast. COMPARISON:  07/12/2018 FINDINGS: Lower chest: Bilateral metastases seen within the lung bases, stable since recent study. Small left pleural effusion. Heart is normal size. Hepatobiliary: Prior partial resection of the liver. Plastic stent is noted in place within the common bile duct, extending into the posterior right liver. Low-density masses noted within the liver, likely not significantly changed, better seen on prior contrast enhanced study. No biliary ductal dilatation. Pancreas: Pancreatic ductal stent noted in the pancreatic head. No ductal dilatation. Spleen: No focal abnormality.  Normal size. Adrenals/Urinary Tract: Right adrenal mass measures up to 2.6 cm with central  high-density areas, likely calcifications, stable. No visible focal renal mass. Urinary bladder unremarkable. Stomach/Bowel: Left lower quadrant ostomy. No evidence of bowel obstruction. Postoperative changes in the pelvis from abdominal perineal resection. Presacral soft tissue thickening, stable. Vascular/Lymphatic: Retroperitoneal adenopathy in the periaortic regions, stable. Reproductive: No visible focal abnormality there is. Other: Free fluid noted within the pelvis, stable. Small amount of free fluid adjacent to the liver and spleen, stable. 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 No acute bony abnormality. IMPRESSION: Bilateral pulmonary metastases in the lung bases, stable. Small left pleural effusion. Biliary and pancreatic stents in place. No evidence of biliary ductal dilatation. Metastases in the liver and right adrenal gland not significantly changed. Retroperitoneal adenopathy, stable. Free fluid in the pelvis and adjacent to the liver and spleen, stable. Electronically Signed   By: Rolm Baptise M.D.   On: 07/25/2018 22:42   Dg Abd 1 View  Result Date: 07/25/2018 CLINICAL DATA:  Right upper quadrant pain since ERCP 07/19/2018. EXAM: ABDOMEN - 1 VIEW COMPARISON:  Images from ERCP 07/19/2018. FINDINGS: Common bile duct and pancreatic stents are in place. The patient is status post cholecystectomy. Left lower quadrant ostomy is identified. The bowel gas pattern is normal. No  radio-opaque calculi or other significant radiographic abnormality are seen. IMPRESSION: No acute abnormality. Electronically Signed   By: Inge Rise M.D.   On: 07/25/2018 16:38   Jacob [name] QM Contrast  Result Date: 07/12/2018 CLINICAL DATA:  Restaging of metastatic colorectal cancer. Brain metastases treated with radiation therapy in 2019. EXAM: MRI HEAD WITHOUT AND WITH CONTRAST TECHNIQUE: Multiplanar, multiecho pulse sequences of the brain and surrounding structures  were obtained without and with intravenous contrast. CONTRAST:  79m MULTIHANCE GADOBENATE DIMEGLUMINE 529 MG/ML IV SOLN COMPARISON:  04/18/2018 FINDINGS: BRAIN New Lesions: 1. 8 mm enhancing lesion with mild edema in the right middle cerebellar peduncle (series 11, image 37). Larger lesions: 1. 5.2 x 2.6 cm irregularly enhancing left occipital lesion with slight worsening of extensive surrounding edema (series 11, image 70 and series 13, image 25, previously 4.7 x 2.2 cm when measured in a similar fashion). 2. 15 x 9 mm medial left parietal lesion (series 11, image 103, previously 12 x 10 mm). 3. 8 mm ring-enhancing lesion in the posterior left frontal lobe with new mild edema (series 11, image 113, previously 3 mm). 4. Minimally increased size of 3 mm right frontal lesion without edema (series 11, image 108, previously 2 mm). Stable or Smaller lesions: None. Other Brain findings: There are chronic blood products associated with the left occipital, left parietal, and left frontal lobe lesions. Mass effect associated with vasogenic edema in the posterior left cerebrum is unchanged with partial effacement of the left lateral ventricle. No acute infarct, midline shift, or extra-axial fluid collection is evident. Vascular: Major intracranial vascular flow voids are preserved. Skull and upper cervical spine: No suspicious marrow lesion. Sinuses/Orbits: Unremarkable orbits. Paranasal sinuses and mastoid air cells are clear. Other: None. IMPRESSION: 1. New 8 mm metastasis in the right middle cerebellar peduncle. 2. Mildly increased size of all other lesions, including the 5 cm left occipital lesion with slight worsening of extensive edema. Electronically Signed   By: ALogan BoresM.D.   On: 07/12/2018 11:45   Ct Abdomen Pelvis W Contrast  Result Date: 07/13/2018 CLINICAL DATA:  Metastatic rectal cancer diagnosed in 2013. Chemotherapy and radiation therapy. Brain metastasis. Partial liver resection. Colostomy. Evaluate  for biliary obstruction. EXAM: CT ABDOMEN AND PELVIS WITH CONTRAST TECHNIQUE: Multidetector CT imaging of the abdomen and pelvis was performed using the standard protocol following bolus administration of intravenous contrast. CONTRAST:  1052mOMNIPAQUE IOHEXOL 300 MG/ML  SOLN COMPARISON:  04/19/2018 FINDINGS: Lower chest: Tiny bilateral pleural effusions. The left effusion is decreased since the prior CT. The right effusion is new. Bilateral pulmonary metastasis. In index right lower lobe 6 mm nodule on image 9/6 is similar to on the prior exam (when remeasured). An index medial right lower lobe 9 mm nodule on image 44/6 is increased from 6 mm on the prior exam (when remeasured). Improved left base aeration, with resolution of previous basilar collapse. Hepatobiliary: Tumor centered about the posterior right liver and involving the intrahepatic IVC again identified. Dominant medial/caudate lobe mass measures 6.5 x 4.4 cm on image 23/2. Compare 5.5 x 3.9 cm on the prior. An adjacent more peripheral/lateral lesion measures 3.3 x 2.8 cm on image 23/2 versus 2.2 x 2.0 cm on the prior. A right hepatic subcapsular lesion is new at 2.4 x 2.2 cm on image 33/2. Cholecystectomy. Development of mild intrahepatic biliary duct dilatation, including image 31/2. This is followed to the level of the central tumor described above. Example area of biliary obstruction on image  27/2. No common duct dilatation. Pancreas: Normal, without mass or ductal dilatation. Spleen: Normal in size, without focal abnormality. 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 Interpolar left renal 1.5 cm lesion on image 16/7 has enlarged from 9 mm on the prior. Normal right kidney, without hydronephrosis. Contrast within the urinary bladder.  Stomach/Bowel: Normal stomach, without wall thickening. Status post abdominal perineal resection with descending colostomy. Normal terminal ileum. Normal small bowel caliber. Vascular/Lymphatic: Aortic and branch vessel atherosclerosis. 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 This is chronic. The portal vein and branches are increasingly narrowed by tumor without acute thrombus. Left periaortic adenopathy at 1.9 cm on image 33/2, similar. A preaortic node measures 1.6 cm on image 38/2 versus 1.3 cm on the prior. No pelvic sidewall adenopathy. Reproductive: Normal prostate. Other: Presacral soft tissue thickening is likely treatment related. Small volume abdominal ascites is new. Musculoskeletal: Bilateral femoral head avascular necrosis. Degenerate disc disease at the lumbosacral junction. Left hemidiaphragm elevation. IMPRESSION: 1. Mild to moderate disease progression compared to 04/19/2018. 2. New and enlarged hepatic metastasis, with involvement of the intrahepatic IVC and increased narrowing of the portal vein/branches. 3. Developing mild intrahepatic biliary duct dilatation, likely secondary to mass effect from central tumor. 4. Progressive abdominal nodal metastasis. Similar right adrenal metastasis. 5. Slight progression of lung base metastasis. Improved left-sided aeration with decreased left and new right pleural effusion. 6. No bowel obstruction or other acute complication. 7. New small volume abdominal ascites. 8. Bilateral femoral head avascular necrosis. 9. Enlarging left renal lesion, suspicious for metastasis. Electronically Signed   By: Abigail Miyamoto M.D.   On: 07/13/2018 10:53   Dg Chest Port 1 View  Result Date: 07/25/2018 CLINICAL DATA:  Cough EXAM: PORTABLE CHEST 1 VIEW COMPARISON:  Chest x-ray dated 07/19/2018. CT chest dated 04/19/2018 FINDINGS: A left subclavian Port-A-Cath is again noted. This is well positioned. The right  lung field is mostly clear with a few scattered opacities. Persistent elevation of the left hemidiaphragm with left-sided volume loss is noted. Hazy airspace opacities are again noted throughout the left lung field. There may be a more focal opacity medial to the Port-A-Cath. No pneumothorax. IMPRESSION: 1. Grossly stable appearance of the chest with diffuse hazy airspace opacities throughout the left lung field with mild to moderate left-sided volume loss. 2. Stable left subclavian Port-A-Cath. Electronically Signed   By: Constance Holster M.D.   On: 07/25/2018 19:36   Dg Chest Port 1 View  Result Date: 07/19/2018 CLINICAL DATA:  Airway aspiration. Lung cancer, rectal cancer. Post ERCP EXAM: PORTABLE CHEST 1 VIEW COMPARISON:  CT chest 04/19/2018 FINDINGS: Elevated left hemidiaphragm. Patchy airspace disease left upper lobe and left lower lobe. This could be acute or chronic disease. Previous studies noted a left hilar mass lesion with complete collapse of the left lung and left pleural effusion. No other baseline study. Left hilar mass appears improved when compared to the prior CT chest. Right lung is clear. Negative for heart failure. Port-A-Cath tip at the cavoatrial junction. IMPRESSION: Patchy airspace disease left lung could be acute or chronic. Improved aeration left lung compared to the prior study when there was a left hilar mass and complete collapse of the left lung. Left pleural effusion has resolved since the prior CT. Right  lung clear. Electronically Signed   By: Franchot Gallo M.D.   On: 07/19/2018 13:12   Dg Ercp Biliary & Pancreatic Ducts  Result Date: 07/19/2018 CLINICAL DATA:  Duct dilatation and stent placement EXAM: ERCP TECHNIQUE: Multiple spot images obtained with the fluoroscopic device and submitted for interpretation post-procedure. FLUOROSCOPY TIME:  Fluoroscopy Time:  14 minutes and 11 seconds Radiation Exposure Index (if provided by the fluoroscopic device): Number of Acquired  Spot Images: 0 COMPARISON:  None. FINDINGS: Images demonstrate cannulation of the common bile duct and contrast filling the biliary tree. A wire has also been placed in the pancreatic duct. There is narrowing of the common hepatic duct at the right and left duct confluence. Balloon duct plasty at the area of narrowing was performed. A radiolucent stent has been placed across the area of narrowing. IMPRESSION: See above. These images were submitted for radiologic interpretation only. Please see the procedural report for the amount of contrast and the fluoroscopy time utilized. Electronically Signed   By: Marybelle Killings M.D.   On: 07/19/2018 13:24    ASSESSMENT AND PLAN: 1.Rectal cancer. Partially obstructing mass noted 1-2 cm from the anal verge on a colonoscopy 03/06/2012. Endoscopic ultrasound 03/14/2012 with a 7.5 mm thick, 3.2 cm wide hypoechoic, irregularly bordered mass that clearly passed into and through the muscularis propria layer of the distal rectal wall (uT3); 3 small (largest 7 mm) perirectal lymph nodes. The lymph nodes were all round, discrete, hypoechoic, homogenous; suspicious for malignant involvement (uN1).  No RAS mutation identified by [facility] 1 testing on the colon resection specimen 06/29/2012, APC alteration identified, microsatellite stable  He began radiation and concurrent Xeloda chemotherapy on 03/20/2012, completed 04/27/2012.   Low anterior resection/coloanal anastomosis and diverting ileostomy 06/29/2012 with the final pathology revealing a T2N0 tumor with extensive fibrosis and negative margins.   Cycle 1 of adjuvant CAPOX 07/19/2012. Cycle 5 of adjuvant CAPOX 10/11/2012.   CEA 2.5 on 11/14/2012.   CEA 14.6 03/08/2013.   Restaging CT evaluation 03/08/2013 with a 4 mm pulmonary nodule left lung base not identified on comparison exam; new liver lesions including a 29 x 26 mm irregular peripheral enhancing rounded lesion in the dome of the right hepatic lobe, a  less well-defined new subcapsular lesion in the lateral right hepatic lobe measuring 12 mm, a new subcapsular lesion in the anterior right hepatic lobe adjacent to the gallbladder fossa measuring 10 mm; a rounded low-density lesion in the inferior right hepatic lobe measuring 10 mm compared to 7 mm on the prior study (radiologist commented this may represent an enlarged cyst).   MRI of the abdomen 04/12/2013 confirmed multiple T2 hyperintense metastatic lesions throughout the right liver   Initiation of FOLFIRI/Avastin with genotype based irinotecan dosing per the [facility] study 04/05/2013.   Restaging CT evaluation on 06/20/2013 (after 2 cycles/4 treatments) showed improvement in the liver metastases and stable size of a left lower lobe pulmonary nodule now with central cavitation.   Continuation of FOLFIRI/Avastin.   Restaging CT evaluation 08/14/2013-decrease in the size of liver metastases, slight decrease in the size of a cavitary left lower lobe nodule, no evidence of disease progression.   Status post right hepatic lobectomy 09/17/2013. Pathology showed multiple foci of metastatic adenocarcinoma (5 nodules of metastatic adenocarcinoma 4 of which are subcapsular with the nodules ranging in size from 0.6-1.8 cm in greatest dimension). Margins not involved. Biopsy of a portal lymph node showed benign adipose tissue; no lymph node tissue or malignancy.   Normal  CEA 11/06/2013   CT 11/06/2013 with a new pleural-based right lower chest lesion, slight in enlargement of the a left-sided lung nodule  CT 01/29/2014 with a decrease in the right lower chest pleural-based lesion and a slight increase of a left-sided lung lesion, other lung lesions were stable  CT chest 05/02/2014 with a decrease in the size of a right lower lobe pulmonary nodule months similar size of a dominant left lower lobe nodule, minimal enlargement of a smaller left lower lobe nodule, no new site of disease  CT chest  11/07/2014 with a slight increase in several left-sided lung nodules  CT chest 02/10/2015 with new small left hilar lymph nodes, a possible new left lower lobe nodule, and stability of other lung nodules  PET scan 03/12/2015 with hypermetabolic left hilar nodes, hypermetabolic left lower lobe nodules, hyper metabolic retroperitoneal nodes, intense hypermetabolism at the coloanal anastomosis, and hypermetabolic thickening in the presacral space  Cycle 1 FOLFIRI/PANITUMUMAB 05/21/2015  Cycle 2 FOLFIRI/PANITUMUMAB 06/05/2015  Cycle 3 FOLFIRI/PANITUMUMAB 06/19/2015  Cycle 4 FOLFIRI 07/10/2015  Cycle 5 FOLFIRI 07/24/2015  Restaging CTs 08/06/2015-resolution of hilar/retroperitoneal adenopathy, improvement in the hypermetabolic lung nodule, other lung nodules are stable, no new lesions  Cycle 6 FOLFIRI with PANITUMUMAB 08/28/2015 -panitumumab dose reduced  Cycle 7 FOLFIRI 09/11/2015-no panitumumab given  Cycle 8 FOLFIRI with PANITUMUMAB 09/25/2015-PANITUMUMAB dose reduced  Cycle 9 FOLFIRI 10/09/2015-no PANITUMUMAB given  Cycle 10 FOLFIRI with panitumumab 10/23/2015  CTs 11/06/2015-possible slight enlargement of a left lower lobe nodule, no other evidence of disease progression.  CTs 02/16/2016-enlargement of left-sided pulmonary nodules and mediastinal/left hilar nodes, improved splenomegaly  CTs 06/21/2016-increased size of mediastinal/left hilar nodes, increased left lung nodules, and increased soft tissue at the porta hepatis  CTs 10/28/2016-progressive disease in the chest and abdomen with an enlarging left hilar mass and slight interval enlargement of pulmonary lesions. Right upper quadrant necrotic nodal mass significantly increased in size with mass effect on the liver and possible invasion. Significant compression of the intrahepatic IVC. New right hepatic lobe lesion. Moderate pelvic ascites.  Cycle 1 FOLFIRI/PANITUMUMAB 11/04/2016  Cycle 2 FOLFIRI/PANITUMUMAB  11/18/2016  Cycle 3 FOLFIRI/panitumumab 12/02/2016  Cycle4 FOLFIRI/PANITUMUMAB 12/15/2016  Cycle 5 FOLFIRI/PANITUMUMAB 12/30/2016  CTs 01/06/2017-no evidence of progressive disease, decreased chest adenopathy, stable left lower lobe nodules, decreased liver metastasis, decreased right retroperitoneal mass  Cycle 6 FOLFIRI/panitumumab 01/13/2017  Cycle 7 FOLFIRI/Panitumumab 01/27/2017  Cycle 8 FOLFIRI/panitumumab 02/17/2017  Cycle 9 FOLFIRI/Panitumumab 03/03/2017  Cycle 10 FOLFIRI/Panitumumab 03/24/2017  CTs 04/06/2017-enlargement of right adrenal mass (smaller than October 2019), stable left hilar fullness and lung nodules  Cycle 11 FOLFIRI/Panitumumab 04/07/2017  Cycle 12 FOLFIRI/panitumumab 04/21/2017  Cycle 13 FOLFIRI/Panitumumab 05/05/2017  Palliative radiation to the right adrenal mass 226 2019-3 02/2018  Brain MRI 07/28/2017-4intracranial metastasis in the right frontal lobe, left parietal lobe and left occipital lobe. Surrounding edema pronounced in the left occipital lobe.  SBRT to for brain lesions on 08/12/2017  CTs 08/10/2017-new left upper lobe airspace process, stable lung nodules and medius and lymphadenopathy, decreased mass involving the adrenal gland with progression of a necrotic anterior portion of the mass, no liver lesions  Cycle 14 FOLFIRI/panitumumab 08/19/2017, irinotecan dose reduced secondary to thrombocytopenia, Panitumumab dose escalated  Cycle 15 FOLFIRI/panitumumab 09/01/2017) Panitumumab dose reduced secondary to an early rash following cycle 14  CT chest 10/24/2017-progression of disease in the left lung; mild progression of left hilar nodal disease with new right hilar lymphadenopathy and progressive increase in lymph nodes in the mediastinum; interval progression of caudate lobe  metastatic lesion.  Cycle 1 FOLFIRI/Panitumumab 11/03/2017  Cycle 2 FOLFIRI/Panitumumab 11/17/2017  Cycle 3 FOLFIRI/panitumumab 12/08/2017 (Panitumumab dose  increased)  Cycle 4 FOLFIRI/Panitumumab 12/22/2017  Cycle 5 FOLFIRI/Panitumumab 01/05/2018  CTs 01/17/2018- new 4 cm posterior liver mass. Mild growth of separate heterogeneous 5.9 cm mass posterior margin of the liver near the IVC. Newbandlike low-attenuation focus in the posterior inferior liver, indeterminate, favoring expansile right portal vein thrombus. Stable right adrenal metastasis. Mixed response in the chest. Left hilar/AP window adenopathy mildly increased. Right paratracheal adenopathy mildly increased. Right hilar adenopathy decreased. Left lower lobe pulmonary nodule stable to mildly decreased. Tiny right upper lobe pulmonary nodule slightly increased. New 2.7 cm sub-solid pulmonary nodule basilar right lower lobe. Waxing and waning left upper lobe nodular foci of consolidation. New mild splenomegaly.  Cycle 1 FOLFOX 02/02/2018  Brain MRI 02/13/2018-new left frontal parietal lesion, mild progression of enhancing lesion in the left occipital lobe-previously treated lesion  Cycle 2 FOLFOX 02/24/2018  SRS to 1 brain lesion 03/02/2018  Cycle 3 FOLFOX 03/09/2018  Cycle 4 FOLFOX 03/28/2018  MRI brain 04/18/2018-progression of disease with increased size in surrounding T2 signal changes involving lesions in the posterior left occipital lobe and medial posterior left parietal lobe. 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  CTs 04/19/2018-progression of left hilar mass with complete obstruction of the left mainstem bronchus. Complete collapse of the left upper lobe and left lower lobe with fluid filling the entire left hemithorax. Unchanged bulky mediastinal adenopathy. 2 small nodules right lung consistent with metastasis. Interval mild increase in tumor burden right hepatic lobe. Increase in upper abdominal periaortic adenopathy. New left adrenal gland metastasis. Stable right adrenal gland metastasis.  Palliative radiation to left chest 04/27/2018-05/10/2018  Cycle 1 Lonsurf  06/05/2018  Cycle 2 Lonsurf 07/03/2018, discontinued 07/10/2018  CT abdomen/pelvis 07/12/2018- new and enlarged hepatic metastasis with involvement of the intrahepatic IVC and increased narrowing of the portal vein/branches.  Developing mild intrahepatic biliary duct dilatation likely secondary to mass-effect from central tumor.  Progressive abdominal nodal metastasis.  Similar right adrenal metastasis.  Slight progression of lung base metastasis.  New right pleural effusion.  New small volume abdominal ascites.  Enlarging left renal lesion suspicious for metastasis. 2. History ofIrregular bowel habits/rectal bleeding secondary to #1. 3. History of Mild elevation of the liver enzymes. Question secondary to hepatic steatosis. 4. Indeterminate 8 mm posterior right liver lesion on the staging CT 03/06/2012. 5. Mildly elevated CEA at 5.7 on 03/06/2012. 6. History of radiation erythema at the groin and perineum 7. 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 No clinical evidence of thrombophlebitis or extravasation. 8. Delayed nausea following chemotherapy-Decadron prophylaxis was added with cycle 3 CAPOX-improved. 9. Ileostomy takedown 12/07/2012. 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 11. Port-A-Cath placement 12/31/204 12. Severe neutropenia secondary to chemotherapy following cycle 1 of FOLFIRIin 2015, chemotherapy was dose reduced and he received Neulasta with day 15 cycle 1  13. Nausea and vomiting following cycle 1 of FOLFIRI 14. Rectal stricture-manual/colonoscopic dilatation by Dr. Ardis Hughs 03/01/2016  APR 08/17/2016-no evidence of malignancy 15. History ofLeukopenia/Thrombocytopenia-persistent, potentially a sequelae of chemotherapy or hepatic toxicity from chemotherapy/radiation. Bone marrow biopsy 11/20/2014 showed cellular bone marrow with trilineage hematopoiesis. Significant dyspoiesis was not present  and there was no evidence of metastatic carcinoma. Cytogenetic analysis showed the presence of normal male chromosomes with no observable clonal chromosomal abnormalities.  probable cirrhosis 16. Genetic testing-negative genetic panel in March 2014 17. 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 Severe over the  face-steroid Dosepak prescribed 07/03/2015. Improved 07/10/2015. Further improved 07/24/2015, 08/07/2015, 08/28/2015 18. Diarrhea secondary to chemotherapy-encouraged to use Imodium  19. History of Paronychia secondary to PANITUMUMAB 20. 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 History of neutropenia, thrombocytopenia 06/23/2017. Possibly related to radiation. He also has a history of suspected portal hypertension secondaryto toxicity from chemotherapy 22. Seizure in the setting of brain metastases 08/05/2017, now maintained onLamictal 23. Altered mental status evaluated by neuro-oncology, Keppra discontinued, improved 24. 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 Lower body edema, multifactorial. Trial of Lasix initiated 05/29/2018  Jacob [name] is a 48 year old male with metastatic rectal cancer.  He has been off treatment secondary to elevated liver enzymes.  He has a significantly elevated total bilirubin due to a biliary obstruction.  He is dehydrated.  He has had a continued decline in his functional status.  He has thrush on his tongue.  -Begin fluconazole for oral thrush -Renal function improving with hydration.  Continue gentle hydration. -GI will evaluate the patient this morning.  Advance diet per GI. -Recommend referral to hospice.  Patient is agreeable.  Order has been placed for care management.   LOS: 1 day   Mikey Bussing, DNP, AGPCNP-BC, AOCNP 07/26/18   Jacob [name] was interviewed and examined.  He is  well-known to me with a history of metastatic rectal cancer. He has recently developed clinical and x-ray evidence of disease progression in multiple sites including the brain, lungs, and liver.  He is admitted with marked hyperbilirubinemia and failure to thrive.  I discussed the current situation and prognosis with Jacob [name].  He is interested in a procedure that may alleviate the hyperbilirubinemia and prevent infection.  I do not recommend further systemic therapy or treatment of brain lesions.  He agrees to a hospice referral for home care.  I will contact his wife.  I will continue following Jacob [name] in the hospital and outpatient follow-up will be arranged.  Recommendations: 1.  Diflucan for oral candidiasis 2.  Evaluation/management of hyperbilirubinemia per gastroenterology 3.  Hospice referral for home care

## 2018-07-26 NOTE — Telephone Encounter (Signed)
Left vm msg. In regards to telephone visit tomorrow @ 12:00pm. Gave call back number if any questions were needed.

## 2018-07-27 ENCOUNTER — Inpatient Hospital Stay: Payer: BC Managed Care – PPO | Admitting: Oncology

## 2018-07-27 ENCOUNTER — Other Ambulatory Visit: Payer: BC Managed Care – PPO

## 2018-07-27 ENCOUNTER — Encounter: Payer: Self-pay | Admitting: *Deleted

## 2018-07-27 ENCOUNTER — Ambulatory Visit: Payer: BC Managed Care – PPO | Admitting: Nurse Practitioner

## 2018-07-27 ENCOUNTER — Ambulatory Visit: Payer: BC Managed Care – PPO | Admitting: Radiation Oncology

## 2018-07-27 ENCOUNTER — Encounter (HOSPITAL_COMMUNITY)
Admission: EM | Disposition: A | Discharge status: Hospice | Payer: Self-pay | Source: Home / Self Care | Attending: Internal Medicine

## 2018-07-27 LAB — COMPREHENSIVE METABOLIC PANEL
ALT: 52 U/L — ABNORMAL HIGH (ref 0–44)
AST: 112 U/L — ABNORMAL HIGH (ref 15–41)
Albumin: 2.2 g/dL — ABNORMAL LOW (ref 3.5–5.0)
Alkaline Phosphatase: 342 U/L — ABNORMAL HIGH (ref 38–126)
Anion gap: 11 (ref 5–15)
BUN: 43 mg/dL — ABNORMAL HIGH (ref 6–20)
CO2: 24 mmol/L (ref 22–32)
Calcium: 8.7 mg/dL — ABNORMAL LOW (ref 8.9–10.3)
Chloride: 94 mmol/L — ABNORMAL LOW (ref 98–111)
Creatinine, Ser: 1.61 mg/dL — ABNORMAL HIGH (ref 0.61–1.24)
GFR calc Af Amer: 58 mL/min — ABNORMAL LOW (ref >60)
GFR calc non Af Amer: 50 mL/min — ABNORMAL LOW (ref >60)
Glucose, Bld: 84 mg/dL (ref 70–99)
Potassium: 4.7 mmol/L (ref 3.5–5.1)
Sodium: 129 mmol/L — ABNORMAL LOW (ref 135–145)
Total Bilirubin: 40.2 mg/dL (ref 0.3–1.2)
Total Protein: 5.3 g/dL — ABNORMAL LOW (ref 6.5–8.1)

## 2018-07-27 LAB — CBC WITH DIFFERENTIAL/PLATELET
Abs Immature Granulocytes: 0.3 10*3/uL — ABNORMAL HIGH (ref 0.00–0.07)
Basophils Absolute: 0 10*3/uL (ref 0.0–0.1)
Basophils Relative: 1 %
Eosinophils Absolute: 0 10*3/uL (ref 0.0–0.5)
Eosinophils Relative: 0 %
HCT: 35.1 % — ABNORMAL LOW (ref 39.0–52.0)
Hemoglobin: 11.8 g/dL — ABNORMAL LOW (ref 13.0–17.0)
Immature Granulocytes: 8 %
Lymphocytes Relative: 5 %
Lymphs Abs: 0.2 10*3/uL — ABNORMAL LOW (ref 0.7–4.0)
MCH: 30 pg (ref 26.0–34.0)
MCHC: 33.6 g/dL (ref 30.0–36.0)
MCV: 89.3 fL (ref 80.0–100.0)
Monocytes Absolute: 0.7 10*3/uL (ref 0.1–1.0)
Monocytes Relative: 18 %
Neutro Abs: 2.5 10*3/uL (ref 1.7–7.7)
Neutrophils Relative %: 68 %
Platelets: 79 10*3/uL — ABNORMAL LOW (ref 150–400)
RBC: 3.93 MIL/uL — ABNORMAL LOW (ref 4.22–5.81)
RDW: 31.5 % — ABNORMAL HIGH (ref 11.5–15.5)
WBC: 3.7 10*3/uL — ABNORMAL LOW (ref 4.0–10.5)
nRBC: 0 % (ref 0.0–0.2)

## 2018-07-27 LAB — PHOSPHORUS: Phosphorus: 4.6 mg/dL (ref 2.5–4.6)

## 2018-07-27 LAB — MAGNESIUM: Magnesium: 2.8 mg/dL — ABNORMAL HIGH (ref 1.7–2.4)

## 2018-07-27 SURGERY — ERCP, WITH INTERVENTION IF INDICATED
Anesthesia: General

## 2018-07-27 MED ORDER — ONDANSETRON HCL 4 MG PO TABS: 4.0000 mg | ORAL_TABLET | Freq: Three times a day (TID) | ORAL | 2 refills | Status: AC | PRN

## 2018-07-27 MED ORDER — OXYCODONE HCL 20 MG/ML PO CONC: 5.0000 mg | ORAL | 0 refills | Status: AC | PRN

## 2018-07-27 MED ORDER — HALOPERIDOL LACTATE 2 MG/ML PO CONC: 1.0000 mg | ORAL | 0 refills | Status: AC | PRN

## 2018-07-27 MED ORDER — FLUCONAZOLE 100 MG PO TABS
100.0000 mg | ORAL_TABLET | Freq: Every day | ORAL | Status: DC
  Administered 2018-07-27: 11:00:00 100 mg via ORAL
  Filled 2018-07-27: qty 1

## 2018-07-27 MED ORDER — LORAZEPAM 2 MG/ML PO CONC: 0.5000 mg | ORAL | 0 refills | Status: AC | PRN

## 2018-07-27 MED ORDER — FLUCONAZOLE 100 MG PO TABS: 100.0000 mg | ORAL_TABLET | Freq: Every day | ORAL | 0 refills | Status: AC

## 2018-07-27 MED ORDER — HALOPERIDOL LACTATE 2 MG/ML PO CONC
1.0000 mg | ORAL | Status: DC | PRN
  Filled 2018-07-27: qty 0.5

## 2018-07-27 MED ORDER — LORAZEPAM 2 MG/ML PO CONC: 0.5000 mg | ORAL | Status: DC | PRN

## 2018-07-27 MED ORDER — OXYCODONE HCL 20 MG/ML PO CONC: 5.0000 mg | ORAL | Status: DC | PRN

## 2018-07-27 MED ORDER — ENSURE ENLIVE PO LIQD: 237.0000 mL | Freq: Two times a day (BID) | ORAL | 12 refills | Status: AC

## 2018-07-27 MED ORDER — ADULT MULTIVITAMIN W/MINERALS CH: 1.0000 | ORAL_TABLET | Freq: Every day | ORAL | 0 refills | Status: AC

## 2018-07-27 NOTE — Progress Notes (Unsigned)
Per Dr. Benay Spice: Going home today or tomorrow with Hospice. Schedule for WebEx visit in 2 weeks. Scheduling message sent

## 2018-07-27 NOTE — Discharge Summary (Signed)
Physician Discharge Summary  Jacob [name] BTD:[id number] DOB: [birth year] DOA: 07/25/2018  PCP: Mosie Lukes, MD  Admit date: 07/25/2018 Discharge date: 07/27/2018  Admitted From: Home Disposition: Home with Home Hospice  Recommendations for Outpatient Follow-up:  1. Follow up Care per Hospice Protocol   Home Health: No Equipment/Devices: None    Discharge Condition: Guarded CODE STATUS: DO NOT RESUSITATE Diet recommendation: Regular Diet  Brief/Interim Summary: HPI per Dr. Zada Finders on 07/25/2018 Jacob Noon Smithis a 48 y.o.malewith medical history significant forrectal cancer with metastases to the liver, lung, and brain,s/pcolostomy, seizure disorder, and thrombocytopenia who presents the ED by recommendation of his gastroenterologist for worsening hyperbilirubinemia and persistent nausea, vomiting, poor oral intake, and lethargy going on for about 1 and half weeks.   Patient underwent ERCP on 07/19/2018 by Dr.Mansouratywhich showed a single localized severe malignant appearing biliary stricture. A biliary sphincterotomy was performed in the stenotic region was dilated. A plastic biliary stent was placed into the right hepatic duct and 1 temporary plastic pancreatic stent was placed into the ventral pancreatic duct to decrease risk of post ERCP pancreatitis. A nonbleeding duodenal ulcer with clean base was seen proximal to the ampulla.  Patient had continued symptoms as above and had follow-up lab work performed by his gastroenterologist this afternoon. Labs are notable for worsening hyperbilirubinemia of 37.9, 18.1 direct bilirubin, alk phos 407, AST 101, ALT 46. He was also noted to have worsening renal function with BUN 47, creatinine 2.12, GFR 33.57 compared to normal renal function 12 days ago.  He says he is having significant functional decline with poor oral intake and decreased colostomy output. He has had swelling in both of his legs but reports decent urine  output. He hasalso been having dyspnea on exertion.  He noticed right peripheral vision loss several weeks ago. He had MRI brain with and without contrast on 07/12/2018 which showed a new 8 mm metastasis in the right middle cerebellar peduncle and mildly increased size of all other lesions including a 5 cm left occipital lesion with worsening of extensive edema. He saw his radiation oncologist on 07/17/2018 recommend continuing dexamethasone 1 mg daily as well as Lamictal for seizures. He was recommended to have radiosurgery for the new lesion for therapeutic and palliative affect.  He currently denies any chest pain, subjective fevers, chills, diaphoresis, diarrhea, dysuria.  ED Course: Initial vitals showed BP 118/74, pulse 90, RR 18, temp 98.0 Fahrenheit, SPO2 97% on room air.  Labs are notable for WBC 3.0, hemoglobin 12.1, platelets90,000, lipase 48, INR 2.0, lactic acid 1.5. SARS-CoV-2 testing was negative. Blood cultures were drawn and pending. Urinalysis was negative for UTI.  Patient was given 1 L normal saline. The EDP discussed the case with on-call lower GI, Dr. Carlean Purl, who recommended IV ciprofloxacin and make n.p.o. at midnight for likely stent removal tomorrow.  Ciprofloxacin was started and a CT abdomen/pelvis without contrast was obtained which showed biliary and pancreatic stents in place without evidence of biliary ductal dilatation. Bilateral pulmonary metastasis in the lung bases reported stable with small left pleural effusion. Metastases in the liver and right adrenal gland not significantly changed from prior as well as retroperitoneal adenopathy which is stable. Free fluid in the pelvis and adjacent to the liver and spleen was seen and stable per radiology report.  The hospital service was consulted to admit for further evaluation management.  Interim History Oncology met with the patient and recommended continuing IV fluid hydration and recommended  further evaluation management  of hyperbilirubinemia per Gastroenterology however Gastroenterology has reviewed the case thoroughly and they do not feel that there is any benefit or effective role for biliary decompression via ERCP and Dr. Rush Landmark does not feel that repeating an ERCP is in the patient's best interest and feels that palliative option is the best course to take and recommending referral to hospice.  Palliative care has been also further consulted and patient will be transitioning home with Home Hospice. SRS per Radiation Oncology will be cancelled and he will be going home with Further care per Hospice Protocol.   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  -GI were consulted and are considering repeat ERCP and possible stent exchange or additional stenting however Dr. Melene Muller has evaluated the CT scans and without any new or progressive biliary ductal dilatation based on the patient's current medical status there is no effective role for biliary decompression via ERCP and without any biliary ductal dilatation being available or significant having radiology do a percutaneous approach is also unlikely and Dr. Stefani Dama ID feels that ERCP is not in the patient's best interest -May need further IR evaluation for possible percutaneous intervention but this is unlikely -T Bili is now 40.2 -Continued IV ciprofloxacin but will stop at D/C  -Continue IV fluids overnight and will continue to monitor -Pain control and antiemetics as needed -N.p.o. 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 New brain metastasis with worsening edema seen on recent MRI. -Continue dexamethasone for brain lesion/edema -Continue Lamictal for seizure prophylaxis -Continue pain control as needed -Patient has been started on Diflucan for oral thrush -Patient is currently on dronabinol 5 mg p.o. 3 times daily PRN for nausea -Medical oncology is now recommending hospice referral at discharge; pain control as outlined and will have palliative care to just -Palliative care has been consulted for further goals of care discussion and he will be going home with Hospice -Started on Haldol, Oxycodone, and Lorazepam for Hospice -Gastroenterology evaluated and feel that ERCP is going to be of no benefit -SRS Radiation Treatment cancelled and will go home with Hospice   Acute Kidney Injury; improving -Likely prerenal due to frequent GI losses. -Continued fluids overnight and monitor labs; Resumed Fluids but will resume at 100 mL per hour for 1 more day and to stop today  -Avoid nephrotoxic medications, contrast dyes, as well as hypotension if possible -BUN/Creatinine went from 47/2.12 is now 44/1.45 -> 43/1.65 -Will not repeat as he is going home with Hospice  Pancytopenia -Patient's WBC on admission was 3.0, hemoglobin/hematocrit was 12.1/36.9, and platelet count was 90 -Now patient WBC is 3.7, hemoglobin/hematocrit is 11.8/35.1, and platelet count is 79,000 -Will not repeat as he is going home with Hospice  Hyponatremia -Patient sodium has been 131 and dropped to 129 -Give LR for now until D/C  -Will not repeat as he is  going home with  Hospice   Oral Thrush -Continue with Clotrimazole 10 mg p.o. 4 times daily as needed for oral thrush started last night -Fluconazole 100 mg IV every 24 started as well and changed to po for 4 more days   Discharge Instructions  Discharge Instructions    Call MD for:  difficulty breathing, headache or visual disturbances   Complete by:  As directed    Call MD for:  extreme fatigue   Complete by:  As directed    Call MD for:  hives   Complete by:  As directed    Call MD for:  persistant dizziness or light-headedness   Complete by:  As directed    Call MD for:  persistant nausea and vomiting   Complete by:  As directed    Call MD for:  redness, tenderness, or signs of infection (pain, swelling, redness, odor or green/yellow discharge around incision site)   Complete by:  As directed    Call MD for:  severe uncontrolled pain   Complete by:  As directed    Call MD for:  temperature >100.4   Complete by:  As directed    Diet - low sodium heart healthy   Complete by:  As directed    Discharge instructions   Complete by:  As directed    You were cared for by a hospitalist during your hospital stay. If you have any questions about your discharge medications or the care you received while you were in the hospital after you are discharged, you can call the unit and ask to speak with the hospitalist on call if the hospitalist that took care of you is not available. Once you are discharged, your primary care physician will handle any further medical issues. Please note that NO REFILLS for any discharge medications will be authorized once you are discharged, as it is imperative that you return to your primary care physician (or establish a relationship with a primary care physician if you do not have one) for your aftercare needs so that they can reassess your need for medications and monitor your lab values.  Further Care per Hospice Protocol.   Increase activity slowly   Complete by:  As  directed      Allergies as of 07/27/2018      Reactions   Alprazolam Other (See Comments)   Excessive sedation   Penicillins Rash   Childhood allergy Has patient had a PCN reaction causing immediate rash, facial/tongue/throat swelling, SOB or lightheadedness with hypotension: Unknown Has patient had a PCN reaction causing severe rash involving mucus membranes or skin necrosis: Unknown Has patient had a PCN reaction that required hospitalization: No Has patient had a PCN reaction occurring within the last 10 years: No If all of the above answers are "NO", then may proceed with Cephalosporin use.      Medication List    STOP taking these medications   tiZANidine 4 MG tablet Commonly known as:  ZANAFLEX   trifluridine-tipiracil 15-6.14 MG tablet Commonly known as:  Lonsurf     TAKE these medications   albuterol 108 (90 Base) MCG/ACT inhaler Commonly known as:  VENTOLIN HFA Inhale 2 puffs into the lungs every 6 (six) hours as needed for wheezing or shortness of breath.   clotrimazole 10 MG troche Commonly known as:  MYCELEX Take 1 lozenge (10 mg total) by mouth 4 (four) times daily. While on decadron What changed:    when to take this  reasons  to take this  additional instructions   dexamethasone 1 MG tablet Commonly known as:  DECADRON Take 2 tablets (2 mg total) by mouth daily. What changed:  how much to take   dronabinol 5 MG capsule Commonly known as:  MARINOL Take 1 capsule by mouth three times daily as needed What changed:  See the new instructions.   feeding supplement (ENSURE ENLIVE) Liqd Take 237 mLs by mouth 2 (two) times daily between meals.   fluconazole 100 MG tablet Commonly known as:  DIFLUCAN Take 1 tablet (100 mg total) by mouth daily.   fluticasone 110 MCG/ACT inhaler Commonly known as:  Flovent HFA Inhale 2 puffs into the lungs 2 (two) times daily.   furosemide 20 MG tablet Commonly known as:  LASIX Take 1 tablet (20 mg total) by mouth  daily.   Guaifenesin 1200 MG Tb12 Take 1,200 mg by mouth 2 (two) times a day.   haloperidol 2 MG/ML solution Commonly known as:  HALDOL Take 0.5 mLs (1 mg total) by mouth every 4 (four) hours as needed for agitation (or nausea).   lamoTRIgine 100 MG tablet Commonly known as:  LAMICTAL Take 1 tablet (100 mg total) by mouth 2 (two) times daily.   lidocaine-prilocaine cream Commonly known as:  EMLA Apply 1 application topically as needed (prior to port access).   LORazepam 2 MG/ML concentrated solution Commonly known as:  ATIVAN Take 0.3 mLs (0.6 mg total) by mouth every 4 (four) hours as needed for anxiety.   multivitamin with minerals Tabs tablet Take 1 tablet by mouth daily.   omeprazole 40 MG capsule Commonly known as:  PriLOSEC Take 1 capsule (40 mg total) by mouth 2 (two) times a day for 30 days.   ondansetron 4 MG tablet Commonly known as:  Zofran Take 1 tablet (4 mg total) by mouth every 8 (eight) hours as needed for nausea or vomiting.   oxyCODONE 20 MG/ML concentrated solution Commonly known as:  ROXICODONE INTENSOL Take 0.3 mLs (6 mg total) by mouth every 2 (two) hours as needed for severe pain (or SOB).   prochlorperazine 10 MG tablet Commonly known as:  COMPAZINE Take 1 tablet (10 mg total) by mouth every 6 (six) hours as needed for nausea or vomiting.   sennosides-docusate sodium 8.6-50 MG tablet Commonly known as:  SENOKOT-S Take 1 tablet by mouth 2 (two) times daily as needed for constipation.   Stool Softener 100 MG capsule Generic drug:  docusate sodium Take 100-200 mg by mouth daily as needed for mild constipation.      Follow-up Information    Mosie Lukes, MD .   Specialty:  Family Medicine Contact information: [address] [place] [address] [phone]          Allergies  Allergen Reactions  . Alprazolam Other (See Comments)    Excessive sedation  . Penicillins Rash    Childhood allergy Has patient had a  PCN reaction causing immediate rash, facial/tongue/throat swelling, SOB or lightheadedness with hypotension: Unknown Has patient had a PCN reaction causing severe rash involving mucus membranes or skin necrosis: Unknown Has patient had a PCN reaction that required hospitalization: No Has patient had a PCN reaction occurring within the last 10 years: No If all of the above answers are "NO", then may proceed with Cephalosporin use.    Consultations:  Medical Oncology  Palliative Care Medicine  Gastroenterology  Procedures/Studies: Ct Abdomen Pelvis Wo Contrast  Result Date: 07/25/2018 CLINICAL DATA:  Metastatic rectal cancer. Jaundice, elevated bilirubin. EXAM: CT ABDOMEN AND PELVIS WITHOUT CONTRAST TECHNIQUE: Multidetector CT imaging of the abdomen and pelvis was performed following the standard protocol without IV contrast. COMPARISON:  07/12/2018 FINDINGS: Lower chest: Bilateral metastases seen within the lung bases, stable since recent study. Small left pleural effusion. Heart is normal size. Hepatobiliary: Prior partial resection of the liver. Plastic stent is noted in place within the common bile duct, extending into the posterior right liver. Low-density masses noted within the liver, likely not significantly changed, better seen on prior contrast enhanced study. No biliary ductal dilatation. Pancreas: Pancreatic ductal stent noted in the pancreatic head. No ductal dilatation. Spleen: No focal abnormality.  Normal size. Adrenals/Urinary Tract: Right adrenal mass measures up to 2.6 cm with central high-density areas, likely calcifications, stable. No visible focal renal mass. Urinary bladder unremarkable. Stomach/Bowel: Left lower quadrant ostomy. No evidence of bowel obstruction. Postoperative changes in the pelvis from abdominal perineal resection. Presacral soft tissue thickening, stable. Vascular/Lymphatic: Retroperitoneal adenopathy in the periaortic regions, stable. Reproductive: No  visible focal abnormality there is. Other: Free fluid noted within the pelvis, stable. Small amount of free fluid adjacent to the liver and spleen, stable. 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 No acute bony abnormality. IMPRESSION: Bilateral pulmonary metastases in the lung bases, stable. Small left pleural effusion. Biliary and pancreatic stents in place. No evidence of biliary ductal dilatation. Metastases in the liver and right adrenal gland not significantly changed. Retroperitoneal adenopathy, stable. Free fluid in the pelvis and adjacent to the liver and spleen, stable. Electronically Signed   By: Rolm Baptise M.D.   On: 07/25/2018 22:42   Dg Abd 1 View  Result Date: 07/25/2018 CLINICAL DATA:  Right upper quadrant pain since ERCP 07/19/2018. EXAM: ABDOMEN - 1 VIEW COMPARISON:  Images from ERCP 07/19/2018. FINDINGS: Common bile duct and pancreatic stents are in place. The patient is status post cholecystectomy. Left lower quadrant ostomy is identified. The bowel gas pattern is normal. No radio-opaque calculi or other significant radiographic abnormality are seen. IMPRESSION: No acute abnormality. Electronically Signed   By: Inge Rise M.D.   On: 07/25/2018 16:38   Mr Jeri Cos KA Contrast  Result Date: 07/12/2018 CLINICAL DATA:  Restaging of metastatic colorectal cancer. Brain metastases treated with radiation therapy in 2019. EXAM: MRI HEAD WITHOUT AND WITH CONTRAST TECHNIQUE: Multiplanar, multiecho pulse sequences of the brain and surrounding structures were obtained without and with intravenous contrast. CONTRAST:  64m MULTIHANCE GADOBENATE DIMEGLUMINE 529 MG/ML IV SOLN COMPARISON:  04/18/2018 FINDINGS: BRAIN New Lesions: 1. 8 mm enhancing lesion with mild edema in the right middle cerebellar peduncle (series 11, image 37). Larger lesions: 1. 5.2 x 2.6 cm irregularly enhancing left occipital lesion with slight worsening of extensive surrounding  edema (series 11, image 70 and series 13, image 25, previously 4.7 x 2.2 cm when measured in a similar fashion). 2. 15 x 9 mm medial left parietal lesion (series 11, image 103, previously 12 x 10 mm). 3. 8 mm ring-enhancing lesion in the posterior left frontal lobe with new mild edema (series 11, image 113, previously 3 mm). 4. Minimally increased size of 3 mm right frontal lesion without edema (series 11, image 108, previously 2 mm). Stable or Smaller lesions: None. Other Brain findings: There are chronic blood products associated with the left occipital, left parietal, and left frontal lobe lesions. Mass effect associated with vasogenic edema in the posterior left cerebrum is unchanged with partial effacement  of the left lateral ventricle. No acute infarct, midline shift, or extra-axial fluid collection is evident. Vascular: Major intracranial vascular flow voids are preserved. Skull and upper cervical spine: No suspicious marrow lesion. Sinuses/Orbits: Unremarkable orbits. Paranasal sinuses and mastoid air cells are clear. Other: None. IMPRESSION: 1. New 8 mm metastasis in the right middle cerebellar peduncle. 2. Mildly increased size of all other lesions, including the 5 cm left occipital lesion with slight worsening of extensive edema. Electronically Signed   By: Logan Bores M.D.   On: 07/12/2018 11:45   Ct Abdomen Pelvis W Contrast  Result Date: 07/13/2018 CLINICAL DATA:  Metastatic rectal cancer diagnosed in 2013. Chemotherapy and radiation therapy. Brain metastasis. Partial liver resection. Colostomy. Evaluate for biliary obstruction. EXAM: CT ABDOMEN AND PELVIS WITH CONTRAST TECHNIQUE: Multidetector CT imaging of the abdomen and pelvis was performed using the standard protocol following bolus administration of intravenous contrast. CONTRAST:  129m OMNIPAQUE IOHEXOL 300 MG/ML  SOLN COMPARISON:  04/19/2018 FINDINGS: Lower chest: Tiny bilateral pleural effusions. The left effusion is decreased since the  prior CT. The right effusion is new. Bilateral pulmonary metastasis. In index right lower lobe 6 mm nodule on image 9/6 is similar to on the prior exam (when remeasured). An index medial right lower lobe 9 mm nodule on image 44/6 is increased from 6 mm on the prior exam (when remeasured). Improved left base aeration, with resolution of previous basilar collapse. Hepatobiliary: Tumor centered about the posterior right liver and involving the intrahepatic IVC again identified. Dominant medial/caudate lobe mass measures 6.5 x 4.4 cm on image 23/2. Compare 5.5 x 3.9 cm on the prior. An adjacent more peripheral/lateral lesion measures 3.3 x 2.8 cm on image 23/2 versus 2.2 x 2.0 cm on the prior. A right hepatic subcapsular lesion is new at 2.4 x 2.2 cm on image 33/2. Cholecystectomy. Development of mild intrahepatic biliary duct dilatation, including image 31/2. This is followed to the level of the central tumor described above. Example area of biliary obstruction on image 27/2. No common duct dilatation. Pancreas: Normal, without mass or ductal dilatation. Spleen: Normal in size, without focal abnormality. 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 Interpolar left renal 1.5 cm lesion on image 16/7 has enlarged from 9 mm on the prior. Normal right kidney, without hydronephrosis. Contrast within the urinary bladder. Stomach/Bowel: Normal stomach, without wall thickening. Status post abdominal perineal resection with descending colostomy. Normal terminal ileum. Normal small bowel caliber. Vascular/Lymphatic: Aortic and branch vessel atherosclerosis. 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 This is chronic. The portal vein and branches are increasingly  narrowed by tumor without acute thrombus. Left periaortic adenopathy at 1.9 cm on image 33/2, similar. A preaortic node measures 1.6 cm on image 38/2 versus 1.3 cm on the prior. No pelvic sidewall adenopathy. Reproductive: Normal prostate. Other: Presacral soft tissue thickening is likely treatment related. Small volume abdominal ascites is new. Musculoskeletal: Bilateral femoral head avascular necrosis. Degenerate disc disease at the lumbosacral junction. Left hemidiaphragm elevation. IMPRESSION: 1. Mild to moderate disease progression compared to 04/19/2018. 2. New and enlarged hepatic metastasis, with involvement of the intrahepatic IVC and increased narrowing of the portal vein/branches. 3. Developing mild intrahepatic biliary duct dilatation, likely secondary to mass effect from central tumor. 4. Progressive abdominal nodal  metastasis. Similar right adrenal metastasis. 5. Slight progression of lung base metastasis. Improved left-sided aeration with decreased left and new right pleural effusion. 6. No bowel obstruction or other acute complication. 7. New small volume abdominal ascites. 8. Bilateral femoral head avascular necrosis. 9. Enlarging left renal lesion, suspicious for metastasis. Electronically Signed   By: Abigail Miyamoto M.D.   On: 07/13/2018 10:53   Dg Chest Port 1 View  Result Date: 07/25/2018 CLINICAL DATA:  Cough EXAM: PORTABLE CHEST 1 VIEW COMPARISON:  Chest x-ray dated 07/19/2018. CT chest dated 04/19/2018 FINDINGS: A left subclavian Port-A-Cath is again noted. This is well positioned. The right lung field is mostly clear with a few scattered opacities. Persistent elevation of the left hemidiaphragm with left-sided volume loss is noted. Hazy airspace opacities are again noted throughout the left lung field. There may be a more focal opacity medial to the Port-A-Cath. No pneumothorax. IMPRESSION: 1. Grossly stable appearance of the chest with diffuse hazy airspace opacities throughout the left  lung field with mild to moderate left-sided volume loss. 2. Stable left subclavian Port-A-Cath. Electronically Signed   By: Constance Holster M.D.   On: 07/25/2018 19:36   Dg Chest Port 1 View  Result Date: 07/19/2018 CLINICAL DATA:  Airway aspiration. Lung cancer, rectal cancer. Post ERCP EXAM: PORTABLE CHEST 1 VIEW COMPARISON:  CT chest 04/19/2018 FINDINGS: Elevated left hemidiaphragm. Patchy airspace disease left upper lobe and left lower lobe. This could be acute or chronic disease. Previous studies noted a left hilar mass lesion with complete collapse of the left lung and left pleural effusion. No other baseline study. Left hilar mass appears improved when compared to the prior CT chest. Right lung is clear. Negative for heart failure. Port-A-Cath tip at the cavoatrial junction. IMPRESSION: Patchy airspace disease left lung could be acute or chronic. Improved aeration left lung compared to the prior study when there was a left hilar mass and complete collapse of the left lung. Left pleural effusion has resolved since the prior CT. Right lung clear. Electronically Signed   By: Franchot Gallo M.D.   On: 07/19/2018 13:12   Dg Ercp Biliary & Pancreatic Ducts  Result Date: 07/19/2018 CLINICAL DATA:  Duct dilatation and stent placement EXAM: ERCP TECHNIQUE: Multiple spot images obtained with the fluoroscopic device and submitted for interpretation post-procedure. FLUOROSCOPY TIME:  Fluoroscopy Time:  14 minutes and 11 seconds Radiation Exposure Index (if provided by the fluoroscopic device): Number of Acquired Spot Images: 0 COMPARISON:  None. FINDINGS: Images demonstrate cannulation of the common bile duct and contrast filling the biliary tree. A wire has also been placed in the pancreatic duct. There is narrowing of the common hepatic duct at the right and left duct confluence. Balloon duct plasty at the area of narrowing was performed. A radiolucent stent has been placed across the area of narrowing.  IMPRESSION: See above. These images were submitted for radiologic interpretation only. Please see the procedural report for the amount of contrast and the fluoroscopy time utilized. Electronically Signed   By: Marybelle Killings M.D.   On: 07/19/2018 13:24     Subjective: Seen and examined at bedside and his main concern was wanting to get some sweet tea.  No chest pain, lightheadedness or dizziness.  Has any abdominal pain.  Still has lots of lower extremity swelling.  No other concerns or complaints at this time and ready to go home with hospice.  Discharge Exam: Vitals:   07/26/18 1946 07/27/18 0459  BP: 115/73 114/77  Pulse: 81 77  Resp: 18 18  Temp: 98.2 F (36.8 C) 97.7 F (36.5 C)  SpO2: 97% 97%   Vitals:   07/26/18 1332 07/26/18 1946 07/27/18 0459 07/27/18 0500  BP: 96/63 115/73 114/77   Pulse: 89 81 77   Resp:  18 18   Temp: 98 F (36.7 C) 98.2 F (36.8 C) 97.7 F (36.5 C)   TempSrc: Oral Oral Oral   SpO2: 96% 97% 97%   Weight:    73.2 kg  Height:       General: Pt is alert, awake, not in acute distress; Severely Jaundiced Cardiovascular: RRR, S1/S2 +, no rubs, no gallops Respiratory: Diminished bilaterally, no wheezing, no rhonchi Abdominal: Soft, NT, ND, bowel sounds + Extremities: 2-3+ LEedema, no cyanosis  The results of significant diagnostics from this hospitalization (including imaging, microbiology, ancillary and laboratory) are listed below for reference.    Microbiology: Recent Results (from the past 240 hour(s))  SARS Coronavirus 2 (CEPHEID- Performed in [facility] hospital lab), Hosp Order     Status: None   Collection Time: 07/25/18  7:33 PM  Result Value Ref Range Status   SARS Coronavirus 2 NEGATIVE NEGATIVE Final    Comment: (NOTE) If result is NEGATIVE SARS-CoV-2 target nucleic acids are NOT DETECTED. The SARS-CoV-2 RNA is generally detectable in upper and lower  respiratory specimens during the acute phase of infection. The lowest  concentration  of SARS-CoV-2 viral copies this assay can detect is 250  copies / mL. A negative result does not preclude SARS-CoV-2 infection  and should not be used as the sole basis for treatment or other  patient management decisions.  A negative result may occur with  improper specimen collection / handling, submission of specimen other  than nasopharyngeal swab, presence of viral mutation(s) within the  areas targeted by this assay, and inadequate number of viral copies  (<250 copies / mL). A negative result must be combined with clinical  observations, patient history, and epidemiological information. If result is POSITIVE SARS-CoV-2 target nucleic acids are DETECTED. The SARS-CoV-2 RNA is generally detectable in upper and lower  respiratory specimens dur ing the acute phase of infection.  Positive  results are indicative of active infection with SARS-CoV-2.  Clinical  correlation with patient history and other diagnostic information is  necessary to determine patient infection status.  Positive results do  not rule out bacterial infection or co-infection with other viruses. If result is PRESUMPTIVE POSTIVE SARS-CoV-2 nucleic acids MAY BE PRESENT.   A presumptive positive result was obtained on the submitted specimen  and confirmed on repeat testing.  While 2019 novel coronavirus  (SARS-CoV-2) nucleic acids may be present in the submitted sample  additional confirmatory testing may be necessary for epidemiological  and / or clinical management purposes  to differentiate between  SARS-CoV-2 and other Sarbecovirus currently known to infect humans.  If clinically indicated additional testing with an alternate test  methodology [phone]) is advised. The SARS-CoV-2 RNA is generally  detectable in upper and lower respiratory sp ecimens during the acute  phase of infection. The expected result is Negative. Fact Sheet for Patients:  StrictlyIdeas.no Fact Sheet for Healthcare  Providers: BankingDealers.co.za This test is not yet approved or cleared by the [place] FDA and has been authorized for detection and/or diagnosis of SARS-CoV-2 by FDA under an Emergency Use Authorization (EUA).  This EUA will remain in effect (meaning this test can be used) for the duration of the COVID-19 declaration under Section  564(b)(1) of the Act, 21 U.S.C. section 360bbb-3(b)(1), unless the authorization is terminated or revoked sooner. Performed at [facility], [place] [address] [place], [place] [address]   Culture, blood (routine x 2)     Status: None (Preliminary result)   Collection Time: 07/25/18  7:34 PM  Result Value Ref Range Status   Specimen Description   Final    BLOOD LEFT HAND Performed at [facility] [address], [place], [place] [address]    Special Requests   Final    BOTTLES DRAWN AEROBIC AND ANAEROBIC Blood Culture adequate volume Performed at [facility] [address], [place], [place] [address]    Culture   Final    NO GROWTH 2 DAYS Performed at [facility] [address] [place], [place] [address]    Report Status PENDING  Incomplete  Culture, blood (routine x 2)     Status: None (Preliminary result)   Collection Time: 07/25/18  7:34 PM  Result Value Ref Range Status   Specimen Description   Final    BLOOD RIGHT FOREARM Performed at [facility] [address], [place], [place] [address]    Special Requests   Final    BOTTLES DRAWN AEROBIC AND ANAEROBIC Blood Culture results may not be optimal due to an inadequate volume of blood received in culture bottles Performed at [facility] [address], [place], [place] [address]    Culture   Final    NO GROWTH 2 DAYS Performed at [facility] [address] [place], [place] [address]    Report Status PENDING  Incomplete    Labs: BNP (last 3  results) No results for input(s): BNP in the last 8760 hours. Basic Metabolic Panel: Recent Labs  Lab 07/25/18 1517 07/26/18 0547 07/27/18 0625  NA 131* 131* 129*  K 3.9 4.1 4.7  CL 93* 96* 94*  CO2 '26 23 24  ' GLUCOSE 98 76 84  BUN 47* 44* 43*  CREATININE 2.12* 1.45* 1.61*  CALCIUM 8.7  8.7 8.5* 8.7*  MG  --   --  2.8*  PHOS  --   --  4.6   Liver Function Tests: Recent Labs  Lab 07/25/18 1334 07/26/18 0547 07/27/18 0625  AST 101* 96* 112*  ALT 46 46* 52*  ALKPHOS 407* 335* 342*  BILITOT 37.9* 32.1* 40.2*  PROT 4.9* 5.0* 5.3*  ALBUMIN 3.0* 2.1* 2.2*   Recent Labs  Lab 07/25/18 1933  LIPASE 48   No results for input(s): AMMONIA in the last 168 hours. CBC: Recent Labs  Lab 07/25/18 1933 07/26/18 0547 07/27/18 0625  WBC 3.0* 2.4* 3.7*  NEUTROABS 2.0  --  2.5  HGB 12.1* 10.5* 11.8*  HCT 36.9* 32.1* 35.1*  MCV 90.7 90.4 89.3  PLT 90* 70* 79*   Cardiac Enzymes: No results for input(s): CKTOTAL, CKMB, CKMBINDEX, TROPONINI in the last 168 hours. BNP: Invalid input(s): POCBNP CBG: No results for input(s): GLUCAP in the last 168 hours. D-Dimer No results for input(s): DDIMER in the last 72 hours. Hgb A1c No results for input(s): HGBA1C in the last 72 hours. Lipid Profile No results for input(s): CHOL, HDL, LDLCALC, TRIG, CHOLHDL, LDLDIRECT in the last 72 hours. Thyroid function studies No results for input(s): TSH, T4TOTAL, T3FREE, THYROIDAB in the last 72 hours.  Invalid input(s): FREET3 Anemia work up No results for input(s): VITAMINB12, FOLATE, FERRITIN, TIBC, IRON, RETICCTPCT in the last 81  hours. Urinalysis    Component Value Date/Time   COLORURINE AMBER (A) 07/25/2018 1947   APPEARANCEUR CLEAR 07/25/2018 1947   LABSPEC 1.016 07/25/2018 1947   LABSPEC 1.020 10/25/2016 1558   PHURINE 5.0 07/25/2018 1947   GLUCOSEU NEGATIVE 07/25/2018 1947   GLUCOSEU Negative 10/25/2016 1558   HGBUR NEGATIVE 07/25/2018 1947   BILIRUBINUR MODERATE (A) 07/25/2018  1947   BILIRUBINUR Negative 10/25/2016 1558   KETONESUR NEGATIVE 07/25/2018 1947   PROTEINUR NEGATIVE 07/25/2018 1947   UROBILINOGEN 0.2 10/25/2016 1558   NITRITE NEGATIVE 07/25/2018 [place] [facility] 07/25/2018 1947   LEUKOCYTESUR Negative 10/25/2016 1558   Sepsis Labs Invalid input(s): PROCALCITONIN,  WBC,  LACTICIDVEN Microbiology Recent Results (from the past 240 hour(s))  SARS Coronavirus 2 (CEPHEID- Performed in [facility] hospital lab), Hosp Order     Status: None   Collection Time: 07/25/18  7:33 PM  Result Value Ref Range Status   SARS Coronavirus 2 NEGATIVE NEGATIVE Final    Comment: (NOTE) If result is NEGATIVE SARS-CoV-2 target nucleic acids are NOT DETECTED. The SARS-CoV-2 RNA is generally detectable in upper and lower  respiratory specimens during the acute phase of infection. The lowest  concentration of SARS-CoV-2 viral copies this assay can detect is 250  copies / mL. A negative result does not preclude SARS-CoV-2 infection  and should not be used as the sole basis for treatment or other  patient management decisions.  A negative result may occur with  improper specimen collection / handling, submission of specimen other  than nasopharyngeal swab, presence of viral mutation(s) within the  areas targeted by this assay, and inadequate number of viral copies  (<250 copies / mL). A negative result must be combined with clinical  observations, patient history, and epidemiological information. If result is POSITIVE SARS-CoV-2 target nucleic acids are DETECTED. The SARS-CoV-2 RNA is generally detectable in upper and lower  respiratory specimens dur ing the acute phase of infection.  Positive  results are indicative of active infection with SARS-CoV-2.  Clinical  correlation with patient history and other diagnostic information is  necessary to determine patient infection status.  Positive results do  not rule out bacterial infection or co-infection with  other viruses. If result is PRESUMPTIVE POSTIVE SARS-CoV-2 nucleic acids MAY BE PRESENT.   A presumptive positive result was obtained on the submitted specimen  and confirmed on repeat testing.  While 2019 novel coronavirus  (SARS-CoV-2) nucleic acids may be present in the submitted sample  additional confirmatory testing may be necessary for epidemiological  and / or clinical management purposes  to differentiate between  SARS-CoV-2 and other Sarbecovirus currently known to infect humans.  If clinically indicated additional testing with an alternate test  methodology [phone]) is advised. The SARS-CoV-2 RNA is generally  detectable in upper and lower respiratory sp ecimens during the acute  phase of infection. The expected result is Negative. Fact Sheet for Patients:  StrictlyIdeas.no Fact Sheet for Healthcare Providers: BankingDealers.co.za This test is not yet approved or cleared by the [place] FDA and has been authorized for detection and/or diagnosis of SARS-CoV-2 by FDA under an Emergency Use Authorization (EUA).  This EUA will remain in effect (meaning this test can be used) for the duration of the COVID-19 declaration under Section 564(b)(1) of the Act, 21 U.S.C. section 360bbb-3(b)(1), unless the authorization is terminated or revoked sooner. Performed at [facility], [place] [address] [place], [place] [address]   Culture, blood (routine x 2)  Status: None (Preliminary result)   Collection Time: 07/25/18  7:34 PM  Result Value Ref Range Status   Specimen Description   Final    BLOOD LEFT HAND Performed at [facility] [address] [place], [place] [address]    Special Requests   Final    BOTTLES DRAWN AEROBIC AND ANAEROBIC Blood Culture adequate volume Performed at [facility] [address], [place], [place] [address]    Culture   Final    NO GROWTH 2  DAYS Performed at [facility] [address], [place], [place] [address]    Report Status PENDING  Incomplete  Culture, blood (routine x 2)     Status: None (Preliminary result)   Collection Time: 07/25/18  7:34 PM  Result Value Ref Range Status   Specimen Description   Final    BLOOD RIGHT FOREARM Performed at [facility] [address] [place], [place] [address]    Special Requests   Final    BOTTLES DRAWN AEROBIC AND ANAEROBIC Blood Culture results may not be optimal due to an inadequate volume of blood received in culture bottles Performed at [facility] [address] [place], [place] [address]    Culture   Final    NO GROWTH 2 DAYS Performed at [facility] [address], [place],  [address]    Report Status PENDING  Incomplete   Time coordinating discharge: 35 minutes  SIGNED:  Kerney Elbe, DO [facility] 07/27/2018, 10:00 AM Pager is on AMION  If 7PM-7AM, please contact night-coverage [email or web address] Password [email or web address]

## 2018-07-27 NOTE — Progress Notes (Signed)
Patient discharged to home with family, discharge instructions reviewed with patients wife and instructions to call if questions.

## 2018-07-27 NOTE — Progress Notes (Addendum)
HEMATOLOGY-ONCOLOGY PROGRESS NOTE  SUBJECTIVE: Resting quietly this morning.  Bilirubin remains significantly elevated.  ERCP has been canceled.  He is scheduled for SRS to his brain lesion later today.  I have reached out to radiation oncology about possibly canceling this.  He has no complaints this morning.    Malignant neoplasm of rectum (HCC)   03/17/2012 Initial Diagnosis    Malignant neoplasm of rectum ([place])    03/20/2012 - 04/27/2012 Radiation Therapy    RT for 28 Fractions w / Xeloda     04/30/2015 - 01/18/2018 Chemotherapy    The patient had palonosetron (ALOXI) injection 0.25 mg, 0.25 mg, Intravenous,  Once, 30 of 32 cycles Administration: 0.25 mg (05/21/2015), 0.25 mg (06/05/2015), 0.25 mg (06/19/2015), 0.25 mg (07/10/2015), 0.25 mg (07/24/2015), 0.25 mg (08/28/2015), 0.25 mg (09/11/2015), 0.25 mg (09/25/2015), 0.25 mg (10/09/2015), 0.25 mg (10/23/2015), 0.25 mg (11/04/2016), 0.25 mg (11/18/2016), 0.25 mg (12/02/2016), 0.25 mg (12/15/2016), 0.25 mg (12/30/2016), 0.25 mg (01/13/2017), 0.25 mg (01/27/2017), 0.25 mg (02/17/2017), 0.25 mg (03/03/2017), 0.25 mg (03/24/2017), 0.25 mg (04/07/2017), 0.25 mg (04/21/2017), 0.25 mg (05/05/2017), 0.25 mg (08/19/2017), 0.25 mg (09/01/2017), 0.25 mg (11/03/2017), 0.25 mg (11/17/2017), 0.25 mg (12/08/2017), 0.25 mg (12/22/2017), 0.25 mg (01/05/2018) pegfilgrastim (NEULASTA) injection 6 mg, 6 mg, Subcutaneous, Once, 23 of 23 cycles Administration: 6 mg (05/23/2015), 6 mg (06/07/2015), 6 mg (06/21/2015), 6 mg (07/12/2015), 6 mg (07/26/2015), 6 mg (08/30/2015), 6 mg (09/13/2015), 6 mg (09/27/2015), 6 mg (10/11/2015), 6 mg (10/25/2015), 6 mg (11/06/2016), 6 mg (11/20/2016), 6 mg (12/04/2016), 6 mg (12/17/2016), 6 mg (01/01/2017), 6 mg (01/15/2017), 6 mg (01/29/2017), 6 mg (02/19/2017), 6 mg (03/05/2017), 6 mg (03/26/2017), 6 mg (04/09/2017), 6 mg (04/23/2017), 6 mg (05/07/2017) pegfilgrastim-cbqv (UDENYCA) injection 6 mg, 6 mg, Subcutaneous, Once, 6 of 8 cycles Administration: 6 mg (08/22/2017), 6 mg (11/05/2017), 6 mg  (11/19/2017), 6 mg (12/10/2017), 6 mg (12/24/2017), 6 mg (01/07/2018) irinotecan (CAMPTOSAR) 258 mg in dextrose 5 % 500 mL chemo infusion, 135 mg/m2 = 258 mg (75 % of original dose 180 mg/m2), Intravenous,  Once, 30 of 32 cycles Dose modification: 135 mg/m2 (original dose 180 mg/m2, Cycle 1, Reason: Provider Judgment), 100 mg/m2 (original dose 180 mg/m2, Cycle 24, Reason: Provider Judgment) Administration: 258 mg (05/21/2015), 260 mg (06/05/2015), 258 mg (06/19/2015), 258 mg (07/10/2015), 260 mg (07/24/2015), 260 mg (08/28/2015), 260 mg (09/11/2015), 260 mg (09/25/2015), 260 mg (10/23/2015), 260 mg (11/04/2016), 240 mg (11/18/2016), 240 mg (12/02/2016), 240 mg (12/15/2016), 240 mg (12/30/2016), 240 mg (01/13/2017), 240 mg (01/27/2017), 240 mg (02/17/2017), 240 mg (03/03/2017), 240 mg (03/24/2017), 240 mg (04/07/2017), 240 mg (04/21/2017), 240 mg (05/05/2017), 180 mg (08/19/2017), 180 mg (09/01/2017), 180 mg (11/03/2017), 180 mg (11/17/2017), 180 mg (12/08/2017), 180 mg (12/22/2017), 180 mg (01/05/2018) leucovorin 764 mg in dextrose 5 % 250 mL infusion, 400 mg/m2 = 764 mg, Intravenous,  Once, 30 of 32 cycles Administration: 764 mg (05/21/2015), 764 mg (06/05/2015), 764 mg (06/19/2015), 764 mg (07/10/2015), 760 mg (07/24/2015), 760 mg (08/28/2015), 760 mg (09/11/2015), 760 mg (09/25/2015), 764 mg (10/09/2015), 764 mg (10/23/2015), 764 mg (11/04/2016), 764 mg (11/18/2016), 764 mg (12/02/2016), 764 mg (12/15/2016), 716 mg (12/30/2016), 716 mg (01/13/2017), 716 mg (01/27/2017), 716 mg (02/17/2017), 716 mg (03/03/2017), 716 mg (03/24/2017), 716 mg (04/07/2017), 716 mg (04/21/2017), 716 mg (05/05/2017), 736 mg (08/19/2017), 736 mg (09/01/2017), 736 mg (11/03/2017), 736 mg (11/17/2017), 736 mg (12/08/2017), 736 mg (12/22/2017), 736 mg (01/05/2018) fluorouracil (ADRUCIL) chemo injection 750 mg, 400 mg/m2 = 750 mg, Intravenous,  Once, 10 of 10 cycles Administration: 750  mg (05/21/2015), 750 mg (06/05/2015), 750 mg (06/19/2015), 750 mg (07/10/2015), 750 mg (07/24/2015), 750 mg (08/28/2015),  750 mg (09/11/2015), 750 mg (09/25/2015), 750 mg (10/09/2015), 750 mg (10/23/2015) leucovorin injection 38 mg, 20 mg/m2 = 38 mg, Intravenous,  Once, 1 of 1 cycle fosaprepitant (EMEND) 150 mg, dexamethasone (DECADRON) 12 mg in sodium chloride 0.9 % 145 mL IVPB, , Intravenous,  Once, 30 of 32 cycles Administration:  (05/21/2015),  (06/05/2015),  (06/19/2015),  (07/10/2015),  (07/24/2015),  (08/28/2015),  (09/11/2015),  (09/25/2015),  (10/09/2015),  (10/23/2015),  (11/04/2016),  (11/18/2016),  (12/02/2016),  (12/15/2016),  (12/30/2016),  (01/13/2017),  (01/27/2017),  (02/17/2017),  (03/03/2017),  (03/24/2017),  (04/07/2017),  (04/21/2017),  (05/05/2017),  (08/19/2017),  (09/01/2017),  (11/03/2017),  (11/17/2017),  (12/08/2017),  (12/22/2017),  (01/05/2018) fluorouracil (ADRUCIL) 4,600 mg in sodium chloride 0.9 % 58 mL chemo infusion, 2,400 mg/m2 = 4,600 mg, Intravenous, 1 Day/Dose, 30 of 32 cycles Administration: 4,600 mg (05/21/2015), 4,600 mg (06/05/2015), 4,600 mg (06/19/2015), 4,600 mg (07/10/2015), 4,600 mg (07/24/2015), 4,600 mg (08/28/2015), 4,600 mg (09/11/2015), 4,600 mg (09/25/2015), 4,600 mg (10/09/2015), 4,600 mg (10/23/2015), 4,600 mg (11/04/2016), 4,600 mg (11/18/2016), 4,600 mg (12/02/2016), 4,600 mg (12/15/2016), 4,300 mg (12/30/2016), 4,300 mg (01/13/2017), 4,300 mg (01/27/2017), 4,300 mg (02/17/2017), 4,300 mg (03/03/2017), 4,300 mg (03/24/2017), 4,300 mg (04/07/2017), 4,300 mg (04/21/2017), 4,300 mg (05/05/2017), 4,300 mg (08/19/2017), 4,400 mg (09/01/2017), 4,400 mg (11/03/2017), 4,400 mg (11/17/2017), 4,400 mg (12/08/2017), 4,400 mg (12/22/2017), 4,400 mg (01/05/2018)  for chemotherapy treatment.     02/02/2018 -  Chemotherapy    The patient had palonosetron (ALOXI) injection 0.25 mg, 0.25 mg, Intravenous,  Once, 4 of 6 cycles Administration: 0.25 mg (02/02/2018), 0.25 mg (02/24/2018), 0.25 mg (03/09/2018), 0.25 mg (03/28/2018) pegfilgrastim-cbqv (UDENYCA) injection 6 mg, 6 mg, Subcutaneous, Once, 3 of 5 cycles Administration: 6 mg (03/11/2018), 6 mg  (03/30/2018) leucovorin 728 mg in dextrose 5 % 250 mL infusion, 400 mg/m2 = 728 mg, Intravenous,  Once, 4 of 6 cycles Administration: 728 mg (02/02/2018), 728 mg (02/24/2018), 728 mg (03/09/2018), 728 mg (03/28/2018) oxaliplatin (ELOXATIN) 150 mg in dextrose 5 % 500 mL chemo infusion, 82 mg/m2 = 155 mg, Intravenous,  Once, 4 of 6 cycles Dose modification: 60 mg/m2 (original dose 85 mg/m2, Cycle 2, Reason: Provider Judgment) Administration: 150 mg (02/02/2018), 110 mg (02/24/2018), 110 mg (03/09/2018), 110 mg (03/28/2018) fluorouracil (ADRUCIL) 4,350 mg in sodium chloride 0.9 % 63 mL chemo infusion, 2,400 mg/m2 = 4,350 mg, Intravenous, 1 Day/Dose, 4 of 6 cycles Administration: 4,350 mg (02/02/2018), 4,350 mg (02/24/2018), 4,350 mg (03/09/2018), 4,350 mg (03/28/2018)  for chemotherapy treatment.      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[place])   09/17/2013 Initial Diagnosis    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[place])    04/30/2015 - 01/18/2018 Chemotherapy    The patient had palonosetron (ALOXI) injection 0.25 mg, 0.25 mg, Intravenous,  Once, 30 of 32 cycles Administration: 0.25 mg (05/21/2015), 0.25 mg (06/05/2015), 0.25 mg (06/19/2015), 0.25 mg (07/10/2015), 0.25 mg (07/24/2015), 0.25 mg (08/28/2015), 0.25 mg (09/11/2015), 0.25 mg (09/25/2015), 0.25 mg (10/09/2015), 0.25 mg (10/23/2015), 0.25 mg (11/04/2016), 0.25 mg (11/18/2016), 0.25 mg (12/02/2016), 0.25 mg (12/15/2016), 0.25 mg (12/30/2016), 0.25 mg (01/13/2017), 0.25 mg (01/27/2017), 0.25 mg (02/17/2017), 0.25 mg (03/03/2017), 0.25 mg (03/24/2017), 0.25 mg (04/07/2017), 0.25 mg (04/21/2017), 0.25 mg (05/05/2017), 0.25 mg (08/19/2017), 0.25 mg (09/01/2017), 0.25 mg (11/03/2017), 0.25 mg (11/17/2017), 0.25 mg (12/08/2017), 0.25 mg (12/22/2017), 0.25 mg (01/05/2018) pegfilgrastim (NEULASTA) injection 6 mg, 6 mg, Subcutaneous, Once, 23 of 23 cycles Administration: 6 mg (05/23/2015),  6 mg (06/07/2015), 6 mg (06/21/2015), 6 mg (07/12/2015), 6 mg (07/26/2015), 6 mg (08/30/2015), 6 mg (09/13/2015), 6 mg  (09/27/2015), 6 mg (10/11/2015), 6 mg (10/25/2015), 6 mg (11/06/2016), 6 mg (11/20/2016), 6 mg (12/04/2016), 6 mg (12/17/2016), 6 mg (01/01/2017), 6 mg (01/15/2017), 6 mg (01/29/2017), 6 mg (02/19/2017), 6 mg (03/05/2017), 6 mg (03/26/2017), 6 mg (04/09/2017), 6 mg (04/23/2017), 6 mg (05/07/2017) pegfilgrastim-cbqv (UDENYCA) injection 6 mg, 6 mg, Subcutaneous, Once, 6 of 8 cycles Administration: 6 mg (08/22/2017), 6 mg (11/05/2017), 6 mg (11/19/2017), 6 mg (12/10/2017), 6 mg (12/24/2017), 6 mg (01/07/2018) irinotecan (CAMPTOSAR) 258 mg in dextrose 5 % 500 mL chemo infusion, 135 mg/m2 = 258 mg (75 % of original dose 180 mg/m2), Intravenous,  Once, 30 of 32 cycles Dose modification: 135 mg/m2 (original dose 180 mg/m2, Cycle 1, Reason: Provider Judgment), 100 mg/m2 (original dose 180 mg/m2, Cycle 24, Reason: Provider Judgment) Administration: 258 mg (05/21/2015), 260 mg (06/05/2015), 258 mg (06/19/2015), 258 mg (07/10/2015), 260 mg (07/24/2015), 260 mg (08/28/2015), 260 mg (09/11/2015), 260 mg (09/25/2015), 260 mg (10/23/2015), 260 mg (11/04/2016), 240 mg (11/18/2016), 240 mg (12/02/2016), 240 mg (12/15/2016), 240 mg (12/30/2016), 240 mg (01/13/2017), 240 mg (01/27/2017), 240 mg (02/17/2017), 240 mg (03/03/2017), 240 mg (03/24/2017), 240 mg (04/07/2017), 240 mg (04/21/2017), 240 mg (05/05/2017), 180 mg (08/19/2017), 180 mg (09/01/2017), 180 mg (11/03/2017), 180 mg (11/17/2017), 180 mg (12/08/2017), 180 mg (12/22/2017), 180 mg (01/05/2018) leucovorin 764 mg in dextrose 5 % 250 mL infusion, 400 mg/m2 = 764 mg, Intravenous,  Once, 30 of 32 cycles Administration: 764 mg (05/21/2015), 764 mg (06/05/2015), 764 mg (06/19/2015), 764 mg (07/10/2015), 760 mg (07/24/2015), 760 mg (08/28/2015), 760 mg (09/11/2015), 760 mg (09/25/2015), 764 mg (10/09/2015), 764 mg (10/23/2015), 764 mg (11/04/2016), 764 mg (11/18/2016), 764 mg (12/02/2016), 764 mg (12/15/2016), 716 mg (12/30/2016), 716 mg (01/13/2017), 716 mg (01/27/2017), 716 mg (02/17/2017), 716 mg (03/03/2017), 716 mg (03/24/2017), 716 mg  (04/07/2017), 716 mg (04/21/2017), 716 mg (05/05/2017), 736 mg (08/19/2017), 736 mg (09/01/2017), 736 mg (11/03/2017), 736 mg (11/17/2017), 736 mg (12/08/2017), 736 mg (12/22/2017), 736 mg (01/05/2018) fluorouracil (ADRUCIL) chemo injection 750 mg, 400 mg/m2 = 750 mg, Intravenous,  Once, 10 of 10 cycles Administration: 750 mg (05/21/2015), 750 mg (06/05/2015), 750 mg (06/19/2015), 750 mg (07/10/2015), 750 mg (07/24/2015), 750 mg (08/28/2015), 750 mg (09/11/2015), 750 mg (09/25/2015), 750 mg (10/09/2015), 750 mg (10/23/2015) leucovorin injection 38 mg, 20 mg/m2 = 38 mg, Intravenous,  Once, 1 of 1 cycle fosaprepitant (EMEND) 150 mg, dexamethasone (DECADRON) 12 mg in sodium chloride 0.9 % 145 mL IVPB, , Intravenous,  Once, 30 of 32 cycles Administration:  (05/21/2015),  (06/05/2015),  (06/19/2015),  (07/10/2015),  (07/24/2015),  (08/28/2015),  (09/11/2015),  (09/25/2015),  (10/09/2015),  (10/23/2015),  (11/04/2016),  (11/18/2016),  (12/02/2016),  (12/15/2016),  (12/30/2016),  (01/13/2017),  (01/27/2017),  (02/17/2017),  (03/03/2017),  (03/24/2017),  (04/07/2017),  (04/21/2017),  (05/05/2017),  (08/19/2017),  (09/01/2017),  (11/03/2017),  (11/17/2017),  (12/08/2017),  (12/22/2017),  (01/05/2018) fluorouracil (ADRUCIL) 4,600 mg in sodium chloride 0.9 % 58 mL chemo infusion, 2,400 mg/m2 = 4,600 mg, Intravenous, 1 Day/Dose, 30 of 32 cycles Administration: 4,600 mg (05/21/2015), 4,600 mg (06/05/2015), 4,600 mg (06/19/2015), 4,600 mg (07/10/2015), 4,600 mg (07/24/2015), 4,600 mg (08/28/2015), 4,600 mg (09/11/2015), 4,600 mg (09/25/2015), 4,600 mg (10/09/2015), 4,600 mg (10/23/2015), 4,600 mg (11/04/2016), 4,600 mg (11/18/2016), 4,600 mg (12/02/2016), 4,600 mg (12/15/2016), 4,300 mg (12/30/2016), 4,300 mg (01/13/2017), 4,300 mg (01/27/2017), 4,300 mg (02/17/2017), 4,300 mg (03/03/2017), 4,300 mg (03/24/2017), 4,300 mg (04/07/2017), 4,300 mg (04/21/2017), 4,300 mg (  05/05/2017), 4,300 mg (08/19/2017), 4,400 mg (09/01/2017), 4,400 mg (11/03/2017), 4,400 mg (11/17/2017), 4,400 mg (12/08/2017), 4,400 mg  (12/22/2017), 4,400 mg (01/05/2018)  for chemotherapy treatment.     02/02/2018 -  Chemotherapy    The patient had palonosetron (ALOXI) injection 0.25 mg, 0.25 mg, Intravenous,  Once, 4 of 6 cycles Administration: 0.25 mg (02/02/2018), 0.25 mg (02/24/2018), 0.25 mg (03/09/2018), 0.25 mg (03/28/2018) pegfilgrastim-cbqv (UDENYCA) injection 6 mg, 6 mg, Subcutaneous, Once, 3 of 5 cycles Administration: 6 mg (03/11/2018), 6 mg (03/30/2018) leucovorin 728 mg in dextrose 5 % 250 mL infusion, 400 mg/m2 = 728 mg, Intravenous,  Once, 4 of 6 cycles Administration: 728 mg (02/02/2018), 728 mg (02/24/2018), 728 mg (03/09/2018), 728 mg (03/28/2018) oxaliplatin (ELOXATIN) 150 mg in dextrose 5 % 500 mL chemo infusion, 82 mg/m2 = 155 mg, Intravenous,  Once, 4 of 6 cycles Dose modification: 60 mg/m2 (original dose 85 mg/m2, Cycle 2, Reason: Provider Judgment) Administration: 150 mg (02/02/2018), 110 mg (02/24/2018), 110 mg (03/09/2018), 110 mg (03/28/2018) fluorouracil (ADRUCIL) 4,350 mg in sodium chloride 0.9 % 63 mL chemo infusion, 2,400 mg/m2 = 4,350 mg, Intravenous, 1 Day/Dose, 4 of 6 cycles Administration: 4,350 mg (02/02/2018), 4,350 mg (02/24/2018), 4,350 mg (03/09/2018), 4,350 mg (03/28/2018)  for chemotherapy treatment.       REVIEW OF SYSTEMS:   Constitutional: Reports generalized weakness and fatigue.  Denies fevers and chills. Eyes: Reports yellowing of his eyes. Ears, nose, mouth, throat, and face: Denies mucositis or sore throat Respiratory: Denies cough, dyspnea or wheezes Cardiovascular: Denies palpitation, chest discomfort Gastrointestinal: Reports nausea and vomiting.  Denies diarrhea. Skin: Denies abnormal skin rashes Lymphatics: Denies new lymphadenopathy or easy bruising Neurological:Denies numbness, tingling or focal weaknesses MSK: Reports back pain. Behavioral/Psych: Mood is stable, no new changes  Extremities: No lower extremity edema All other systems were reviewed with the patient and are  negative.  I have reviewed the past medical history, past surgical history, social history and family history with the patient and they are unchanged from previous note.   PHYSICAL EXAMINATION:  Vitals:   07/26/18 1946 07/27/18 0459  BP: 115/73 114/77  Pulse: 81 77  Resp: 18 18  Temp: 98.2 F (36.8 C) 97.7 F (36.5 C)  SpO2: 97% 97%   Filed Weights   07/25/18 2253 07/26/18 0500 07/27/18 0500  Weight: 164 lb 10.9 oz (74.7 kg) 164 lb 0.4 oz (74.4 kg) 161 lb 6 oz (73.2 kg)    Intake/Output from previous day: 05/06 0701 - 05/07 0700 In: 1736.2 [P.O.:400; I.V.:886.2; IV Piggyback:450] Out: 700 [Urine:700]  GENERAL:alert, cachectic SKIN: skin color, texture, turgor are normal, no rashes or significant lesions EYES: Jaundice noted. OROPHARYNX: Thrush noted on tongue. NECK: supple, thyroid normal size, non-tender, without nodularity LYMPH:  no palpable lymphadenopathy in the cervical, axillary or inguinal LUNGS: clear to auscultation and percussion with normal breathing effort HEART: regular rate & rhythm and no murmurs.  2+ pitting edema to the bilateral lower extremities. ABDOMEN: Colostomy in place.  Bowel sounds diminished. Musculoskeletal:no cyanosis of digits and no clubbing  NEURO: alert & oriented x 3 with fluent speech, no focal motor/sensory deficits  LABORATORY DATA:  I have reviewed the data as listed CMP Latest Ref Rng & Units 07/27/2018 07/26/2018 07/25/2018  Glucose 70 - 99 mg/dL 84 76 98  BUN 6 - 20 mg/dL 43(H) 44(H) 47(H)  Creatinine 0.61 - 1.24 mg/dL 1.61(H) 1.45(H) 2.12(H)  Sodium 135 - 145 mmol/L 129(L) 131(L) 131(L)  Potassium 3.5 - 5.1 mmol/L 4.7 4.1 3.9  Chloride 98 - 111 mmol/L 94(L) 96(L) 93(L)  CO2 22 - 32 mmol/L '24 23 26  ' Calcium 8.9 - 10.3 mg/dL 8.7(L) 8.5(L) 8.7  Total Protein 6.5 - 8.1 g/dL 5.3(L) 5.0(L) -  Total Bilirubin 0.3 - 1.2 mg/dL 40.2(HH) 32.1(HH) -  Alkaline Phos 38 - 126 U/L 342(H) 335(H) -  AST 15 - 41 U/L 112(H) 96(H) -  ALT 0 - 44  U/L 52(H) 46(H) -    Lab Results  Component Value Date   WBC 3.7 (L) 07/27/2018   HGB 11.8 (L) 07/27/2018   HCT 35.1 (L) 07/27/2018   MCV 89.3 07/27/2018   PLT 79 (L) 07/27/2018   NEUTROABS PENDING 07/27/2018    Ct Abdomen Pelvis Wo Contrast  Result Date: 07/25/2018 CLINICAL DATA:  Metastatic rectal cancer. Jaundice, elevated bilirubin. EXAM: CT ABDOMEN AND PELVIS WITHOUT CONTRAST TECHNIQUE: Multidetector CT imaging of the abdomen and pelvis was performed following the standard protocol without IV contrast. COMPARISON:  07/12/2018 FINDINGS: Lower chest: Bilateral metastases seen within the lung bases, stable since recent study. Small left pleural effusion. Heart is normal size. Hepatobiliary: Prior partial resection of the liver. Plastic stent is noted in place within the common bile duct, extending into the posterior right liver. Low-density masses noted within the liver, likely not significantly changed, better seen on prior contrast enhanced study. No biliary ductal dilatation. Pancreas: Pancreatic ductal stent noted in the pancreatic head. No ductal dilatation. Spleen: No focal abnormality.  Normal size. Adrenals/Urinary Tract: Right adrenal mass measures up to 2.6 cm with central high-density areas, likely calcifications, stable. No visible focal renal mass. Urinary bladder unremarkable. Stomach/Bowel: Left lower quadrant ostomy. No evidence of bowel obstruction. Postoperative changes in the pelvis from abdominal perineal resection. Presacral soft tissue thickening, stable. Vascular/Lymphatic: Retroperitoneal adenopathy in the periaortic regions, stable. Reproductive: No visible focal abnormality there is. Other: Free fluid noted within the pelvis, stable. Small amount of free fluid adjacent to the liver and spleen, stable. 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 No acute bony abnormality. IMPRESSION: Bilateral pulmonary metastases in the lung  bases, stable. Small left pleural effusion. Biliary and pancreatic stents in place. No evidence of biliary ductal dilatation. Metastases in the liver and right adrenal gland not significantly changed. Retroperitoneal adenopathy, stable. Free fluid in the pelvis and adjacent to the liver and spleen, stable. Electronically Signed   By: Rolm Baptise M.D.   On: 07/25/2018 22:42   Dg Abd 1 View  Result Date: 07/25/2018 CLINICAL DATA:  Right upper quadrant pain since ERCP 07/19/2018. EXAM: ABDOMEN - 1 VIEW COMPARISON:  Images from ERCP 07/19/2018. FINDINGS: Common bile duct and pancreatic stents are in place. The patient is status post cholecystectomy. Left lower quadrant ostomy is identified. The bowel gas pattern is normal. No radio-opaque calculi or other significant radiographic abnormality are seen. IMPRESSION: No acute abnormality. Electronically Signed   By: Inge Rise M.D.   On: 07/25/2018 16:38   Mr Jeri Cos YB Contrast  Result Date: 07/12/2018 CLINICAL DATA:  Restaging of metastatic colorectal cancer. Brain metastases treated with radiation therapy in 2019. EXAM: MRI HEAD WITHOUT AND WITH CONTRAST TECHNIQUE: Multiplanar, multiecho pulse sequences of the brain and surrounding structures were obtained without and with intravenous contrast. CONTRAST:  41m MULTIHANCE GADOBENATE DIMEGLUMINE 529 MG/ML IV SOLN COMPARISON:  04/18/2018 FINDINGS: BRAIN New Lesions: 1. 8 mm enhancing lesion with mild edema in the right middle cerebellar peduncle (series 11, image 37). Larger lesions: 1. 5.2 x 2.6 cm  irregularly enhancing left occipital lesion with slight worsening of extensive surrounding edema (series 11, image 70 and series 13, image 25, previously 4.7 x 2.2 cm when measured in a similar fashion). 2. 15 x 9 mm medial left parietal lesion (series 11, image 103, previously 12 x 10 mm). 3. 8 mm ring-enhancing lesion in the posterior left frontal lobe with new mild edema (series 11, image 113, previously 3 mm).  4. Minimally increased size of 3 mm right frontal lesion without edema (series 11, image 108, previously 2 mm). Stable or Smaller lesions: None. Other Brain findings: There are chronic blood products associated with the left occipital, left parietal, and left frontal lobe lesions. Mass effect associated with vasogenic edema in the posterior left cerebrum is unchanged with partial effacement of the left lateral ventricle. No acute infarct, midline shift, or extra-axial fluid collection is evident. Vascular: Major intracranial vascular flow voids are preserved. Skull and upper cervical spine: No suspicious marrow lesion. Sinuses/Orbits: Unremarkable orbits. Paranasal sinuses and mastoid air cells are clear. Other: None. IMPRESSION: 1. New 8 mm metastasis in the right middle cerebellar peduncle. 2. Mildly increased size of all other lesions, including the 5 cm left occipital lesion with slight worsening of extensive edema. Electronically Signed   By: Logan Bores M.D.   On: 07/12/2018 11:45   Ct Abdomen Pelvis W Contrast  Result Date: 07/13/2018 CLINICAL DATA:  Metastatic rectal cancer diagnosed in 2013. Chemotherapy and radiation therapy. Brain metastasis. Partial liver resection. Colostomy. Evaluate for biliary obstruction. EXAM: CT ABDOMEN AND PELVIS WITH CONTRAST TECHNIQUE: Multidetector CT imaging of the abdomen and pelvis was performed using the standard protocol following bolus administration of intravenous contrast. CONTRAST:  178m OMNIPAQUE IOHEXOL 300 MG/ML  SOLN COMPARISON:  04/19/2018 FINDINGS: Lower chest: Tiny bilateral pleural effusions. The left effusion is decreased since the prior CT. The right effusion is new. Bilateral pulmonary metastasis. In index right lower lobe 6 mm nodule on image 9/6 is similar to on the prior exam (when remeasured). An index medial right lower lobe 9 mm nodule on image 44/6 is increased from 6 mm on the prior exam (when remeasured). Improved left base aeration, with  resolution of previous basilar collapse. Hepatobiliary: Tumor centered about the posterior right liver and involving the intrahepatic IVC again identified. Dominant medial/caudate lobe mass measures 6.5 x 4.4 cm on image 23/2. Compare 5.5 x 3.9 cm on the prior. An adjacent more peripheral/lateral lesion measures 3.3 x 2.8 cm on image 23/2 versus 2.2 x 2.0 cm on the prior. A right hepatic subcapsular lesion is new at 2.4 x 2.2 cm on image 33/2. Cholecystectomy. Development of mild intrahepatic biliary duct dilatation, including image 31/2. This is followed to the level of the central tumor described above. Example area of biliary obstruction on image 27/2. No common duct dilatation. Pancreas: Normal, without mass or ductal dilatation. Spleen: Normal in size, without focal abnormality. 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 Interpolar left renal 1.5 cm lesion on image 16/7 has enlarged from 9 mm on the prior. Normal right kidney, without hydronephrosis. Contrast within the urinary bladder. Stomach/Bowel: Normal stomach, without wall thickening. Status post abdominal perineal resection with descending colostomy. Normal terminal ileum. Normal small bowel caliber. Vascular/Lymphatic: Aortic and branch vessel atherosclerosis. 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 This is chronic. The portal vein and branches are increasingly narrowed by tumor without acute thrombus. Left periaortic adenopathy at 1.9 cm on image 33/2, similar. A preaortic node measures 1.6 cm on image 38/2 versus 1.3 cm on the prior. No pelvic sidewall adenopathy. Reproductive: Normal prostate. Other: Presacral soft tissue thickening is likely treatment related. Small volume  abdominal ascites is new. Musculoskeletal: Bilateral femoral head avascular necrosis. Degenerate disc disease at the lumbosacral junction. Left hemidiaphragm elevation. IMPRESSION: 1. Mild to moderate disease progression compared to 04/19/2018. 2. New and enlarged hepatic metastasis, with involvement of the intrahepatic IVC and increased narrowing of the portal vein/branches. 3. Developing mild intrahepatic biliary duct dilatation, likely secondary to mass effect from central tumor. 4. Progressive abdominal nodal metastasis. Similar right adrenal metastasis. 5. Slight progression of lung base metastasis. Improved left-sided aeration with decreased left and new right pleural effusion. 6. No bowel obstruction or other acute complication. 7. New small volume abdominal ascites. 8. Bilateral femoral head avascular necrosis. 9. Enlarging left renal lesion, suspicious for metastasis. Electronically Signed   By: Abigail Miyamoto M.D.   On: 07/13/2018 10:53   Dg Chest Port 1 View  Result Date: 07/25/2018 CLINICAL DATA:  Cough EXAM: PORTABLE CHEST 1 VIEW COMPARISON:  Chest x-ray dated 07/19/2018. CT chest dated 04/19/2018 FINDINGS: A left subclavian Port-A-Cath is again noted. This is well positioned. The right lung field is mostly clear with a few scattered opacities. Persistent elevation of the left hemidiaphragm with left-sided volume loss is noted. Hazy airspace opacities are again noted throughout the left lung field. There may be a more focal opacity medial to the Port-A-Cath. No pneumothorax. IMPRESSION: 1. Grossly stable appearance of the chest with diffuse hazy airspace opacities throughout the left lung field with mild to moderate left-sided volume loss. 2. Stable left subclavian Port-A-Cath. Electronically Signed   By: Constance Holster M.D.   On: 07/25/2018 19:36   Dg Chest Port 1 View  Result Date: 07/19/2018 CLINICAL DATA:  Airway aspiration. Lung cancer, rectal cancer. Post ERCP EXAM: PORTABLE CHEST 1 VIEW  COMPARISON:  CT chest 04/19/2018 FINDINGS: Elevated left hemidiaphragm. Patchy airspace disease left upper lobe and left lower lobe. This could be acute or chronic disease. Previous studies noted a left hilar mass lesion with complete collapse of the left lung and left pleural effusion. No other baseline study. Left hilar mass appears improved when compared to the prior CT chest. Right lung is clear. Negative for heart failure. Port-A-Cath tip at the cavoatrial junction. IMPRESSION: Patchy airspace disease left lung could be acute or chronic. Improved aeration left lung compared to the prior study when there was a left hilar mass and complete collapse of the left lung. Left pleural effusion has resolved since the prior CT. Right lung clear. Electronically Signed   By: Franchot Gallo M.D.   On: 07/19/2018 13:12   Dg Ercp Biliary & Pancreatic Ducts  Result Date: 07/19/2018 CLINICAL DATA:  Duct dilatation and stent placement EXAM: ERCP TECHNIQUE: Multiple spot images obtained with the fluoroscopic device and submitted for interpretation post-procedure. FLUOROSCOPY TIME:  Fluoroscopy Time:  14 minutes and 11 seconds Radiation Exposure Index (if provided by the fluoroscopic device): Number of Acquired Spot Images: 0 COMPARISON:  None. FINDINGS: Images demonstrate cannulation of the common bile duct and contrast filling the biliary tree. A wire has also been placed in the pancreatic duct. There is narrowing of the common hepatic duct at the right and left duct confluence.  Balloon duct plasty at the area of narrowing was performed. A radiolucent stent has been placed across the area of narrowing. IMPRESSION: See above. These images were submitted for radiologic interpretation only. Please see the procedural report for the amount of contrast and the fluoroscopy time utilized. Electronically Signed   By: Marybelle Killings M.D.   On: 07/19/2018 13:24    ASSESSMENT AND PLAN: 1.Rectal cancer. Partially obstructing mass  noted 1-2 cm from the anal verge on a colonoscopy 03/06/2012. Endoscopic ultrasound 03/14/2012 with a 7.5 mm thick, 3.2 cm wide hypoechoic, irregularly bordered mass that clearly passed into and through the muscularis propria layer of the distal rectal wall (uT3); 3 small (largest 7 mm) perirectal lymph nodes. The lymph nodes were all round, discrete, hypoechoic, homogenous; suspicious for malignant involvement (uN1).  No RAS mutation identified by [facility] 1 testing on the colon resection specimen 06/29/2012, APC alteration identified, microsatellite stable  He began radiation and concurrent Xeloda chemotherapy on 03/20/2012, completed 04/27/2012.   Low anterior resection/coloanal anastomosis and diverting ileostomy 06/29/2012 with the final pathology revealing a T2N0 tumor with extensive fibrosis and negative margins.   Cycle 1 of adjuvant CAPOX 07/19/2012. Cycle 5 of adjuvant CAPOX 10/11/2012.   CEA 2.5 on 11/14/2012.   CEA 14.6 03/08/2013.   Restaging CT evaluation 03/08/2013 with a 4 mm pulmonary nodule left lung base not identified on comparison exam; new liver lesions including a 29 x 26 mm irregular peripheral enhancing rounded lesion in the dome of the right hepatic lobe, a less well-defined new subcapsular lesion in the lateral right hepatic lobe measuring 12 mm, a new subcapsular lesion in the anterior right hepatic lobe adjacent to the gallbladder fossa measuring 10 mm; a rounded low-density lesion in the inferior right hepatic lobe measuring 10 mm compared to 7 mm on the prior study (radiologist commented this may represent an enlarged cyst).   MRI of the abdomen 04/12/2013 confirmed multiple T2 hyperintense metastatic lesions throughout the right liver   Initiation of FOLFIRI/Avastin with genotype based irinotecan dosing per the [facility] study 04/05/2013.   Restaging CT evaluation on 06/20/2013 (after 2 cycles/4 treatments) showed improvement in the liver metastases and stable  size of a left lower lobe pulmonary nodule now with central cavitation.   Continuation of FOLFIRI/Avastin.   Restaging CT evaluation 08/14/2013-decrease in the size of liver metastases, slight decrease in the size of a cavitary left lower lobe nodule, no evidence of disease progression.   Status post right hepatic lobectomy 09/17/2013. Pathology showed multiple foci of metastatic adenocarcinoma (5 nodules of metastatic adenocarcinoma 4 of which are subcapsular with the nodules ranging in size from 0.6-1.8 cm in greatest dimension). Margins not involved. Biopsy of a portal lymph node showed benign adipose tissue; no lymph node tissue or malignancy.   Normal CEA 11/06/2013   CT 11/06/2013 with a new pleural-based right lower chest lesion, slight in enlargement of the a left-sided lung nodule  CT 01/29/2014 with a decrease in the right lower chest pleural-based lesion and a slight increase of a left-sided lung lesion, other lung lesions were stable  CT chest 05/02/2014 with a decrease in the size of a right lower lobe pulmonary nodule months similar size of a dominant left lower lobe nodule, minimal enlargement of a smaller left lower lobe nodule, no new site of disease  CT chest 11/07/2014 with a slight increase in several left-sided lung nodules  CT chest 02/10/2015 with new small left hilar lymph nodes, a possible new left lower lobe  nodule, and stability of other lung nodules  PET scan 03/12/2015 with hypermetabolic left hilar nodes, hypermetabolic left lower lobe nodules, hyper metabolic retroperitoneal nodes, intense hypermetabolism at the coloanal anastomosis, and hypermetabolic thickening in the presacral space  Cycle 1 FOLFIRI/PANITUMUMAB 05/21/2015  Cycle 2 FOLFIRI/PANITUMUMAB 06/05/2015  Cycle 3 FOLFIRI/PANITUMUMAB 06/19/2015  Cycle 4 FOLFIRI 07/10/2015  Cycle 5 FOLFIRI 07/24/2015  Restaging CTs 08/06/2015-resolution of hilar/retroperitoneal adenopathy, improvement in the  hypermetabolic lung nodule, other lung nodules are stable, no new lesions  Cycle 6 FOLFIRI with PANITUMUMAB 08/28/2015 -panitumumab dose reduced  Cycle 7 FOLFIRI 09/11/2015-no panitumumab given  Cycle 8 FOLFIRI with PANITUMUMAB 09/25/2015-PANITUMUMAB dose reduced  Cycle 9 FOLFIRI 10/09/2015-no PANITUMUMAB given  Cycle 10 FOLFIRI with panitumumab 10/23/2015  CTs 11/06/2015-possible slight enlargement of a left lower lobe nodule, no other evidence of disease progression.  CTs 02/16/2016-enlargement of left-sided pulmonary nodules and mediastinal/left hilar nodes, improved splenomegaly  CTs 06/21/2016-increased size of mediastinal/left hilar nodes, increased left lung nodules, and increased soft tissue at the porta hepatis  CTs 10/28/2016-progressive disease in the chest and abdomen with an enlarging left hilar mass and slight interval enlargement of pulmonary lesions. Right upper quadrant necrotic nodal mass significantly increased in size with mass effect on the liver and possible invasion. Significant compression of the intrahepatic IVC. New right hepatic lobe lesion. Moderate pelvic ascites.  Cycle 1 FOLFIRI/PANITUMUMAB 11/04/2016  Cycle 2 FOLFIRI/PANITUMUMAB 11/18/2016  Cycle 3 FOLFIRI/panitumumab 12/02/2016  Cycle4 FOLFIRI/PANITUMUMAB 12/15/2016  Cycle 5 FOLFIRI/PANITUMUMAB 12/30/2016  CTs 01/06/2017-no evidence of progressive disease, decreased chest adenopathy, stable left lower lobe nodules, decreased liver metastasis, decreased right retroperitoneal mass  Cycle 6 FOLFIRI/panitumumab 01/13/2017  Cycle 7 FOLFIRI/Panitumumab 01/27/2017  Cycle 8 FOLFIRI/panitumumab 02/17/2017  Cycle 9 FOLFIRI/Panitumumab 03/03/2017  Cycle 10 FOLFIRI/Panitumumab 03/24/2017  CTs 04/06/2017-enlargement of right adrenal mass (smaller than October 2019), stable left hilar fullness and lung nodules  Cycle 11 FOLFIRI/Panitumumab 04/07/2017  Cycle 12 FOLFIRI/panitumumab 04/21/2017  Cycle 13  FOLFIRI/Panitumumab 05/05/2017  Palliative radiation to the right adrenal mass 226 2019-3 02/2018  Brain MRI 07/28/2017-4intracranial metastasis in the right frontal lobe, left parietal lobe and left occipital lobe. Surrounding edema pronounced in the left occipital lobe.  SBRT to for brain lesions on 08/12/2017  CTs 08/10/2017-new left upper lobe airspace process, stable lung nodules and medius and lymphadenopathy, decreased mass involving the adrenal gland with progression of a necrotic anterior portion of the mass, no liver lesions  Cycle 14 FOLFIRI/panitumumab 08/19/2017, irinotecan dose reduced secondary to thrombocytopenia, Panitumumab dose escalated  Cycle 15 FOLFIRI/panitumumab 09/01/2017) Panitumumab dose reduced secondary to an early rash following cycle 14  CT chest 10/24/2017-progression of disease in the left lung; mild progression of left hilar nodal disease with new right hilar lymphadenopathy and progressive increase in lymph nodes in the mediastinum; interval progression of caudate lobe metastatic lesion.  Cycle 1 FOLFIRI/Panitumumab 11/03/2017  Cycle 2 FOLFIRI/Panitumumab 11/17/2017  Cycle 3 FOLFIRI/panitumumab 12/08/2017 (Panitumumab dose increased)  Cycle 4 FOLFIRI/Panitumumab 12/22/2017  Cycle 5 FOLFIRI/Panitumumab 01/05/2018  CTs 01/17/2018- new 4 cm posterior liver mass. Mild growth of separate heterogeneous 5.9 cm mass posterior margin of the liver near the IVC. Newbandlike low-attenuation focus in the posterior inferior liver, indeterminate, favoring expansile right portal vein thrombus. Stable right adrenal metastasis. Mixed response in the chest. Left hilar/AP window adenopathy mildly increased. Right paratracheal adenopathy mildly increased. Right hilar adenopathy decreased. Left lower lobe pulmonary nodule stable to mildly decreased. Tiny right upper lobe pulmonary nodule slightly increased. New 2.7 cm sub-solid pulmonary nodule basilar right lower lobe.  Waxing and waning left upper lobe nodular foci of consolidation. New mild splenomegaly.  Cycle 1 FOLFOX 02/02/2018  Brain MRI 02/13/2018-new left frontal parietal lesion, mild progression of enhancing lesion in the left occipital lobe-previously treated lesion  Cycle 2 FOLFOX 02/24/2018  SRS to 1 brain lesion 03/02/2018  Cycle 3 FOLFOX 03/09/2018  Cycle 4 FOLFOX 03/28/2018  MRI brain 04/18/2018-progression of disease with increased size in surrounding T2 signal changes involving lesions in the posterior left occipital lobe and medial posterior left parietal lobe. 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  CTs 04/19/2018-progression of left hilar mass with complete obstruction of the left mainstem bronchus. Complete collapse of the left upper lobe and left lower lobe with fluid filling the entire left hemithorax. Unchanged bulky mediastinal adenopathy. 2 small nodules right lung consistent with metastasis. Interval mild increase in tumor burden right hepatic lobe. Increase in upper abdominal periaortic adenopathy. New left adrenal gland metastasis. Stable right adrenal gland metastasis.  Palliative radiation to left chest 04/27/2018-05/10/2018  Cycle 1 Lonsurf 06/05/2018  Cycle 2 Lonsurf 07/03/2018, discontinued 07/10/2018  CT abdomen/pelvis 07/12/2018- new and enlarged hepatic metastasis with involvement of the intrahepatic IVC and increased narrowing of the portal vein/branches.  Developing mild intrahepatic biliary duct dilatation likely secondary to mass-effect from central tumor.  Progressive abdominal nodal metastasis.  Similar right adrenal metastasis.  Slight progression of lung base metastasis.  New right pleural effusion.  New small volume abdominal ascites.  Enlarging left renal lesion suspicious for metastasis. 2. History ofIrregular bowel habits/rectal bleeding secondary to #1. 3. History of Mild elevation of the liver enzymes. Question secondary to hepatic steatosis. 4. Indeterminate  8 mm posterior right liver lesion on the staging CT 03/06/2012. 5. Mildly elevated CEA at 5.7 on 03/06/2012. 6. History of radiation erythema at the groin and perineum 7. 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 No clinical evidence of thrombophlebitis or extravasation. 8. Delayed nausea following chemotherapy-Decadron prophylaxis was added with cycle 3 CAPOX-improved. 9. Ileostomy takedown 12/07/2012. 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 11. Port-A-Cath placement 12/31/204 12. Severe neutropenia secondary to chemotherapy following cycle 1 of FOLFIRIin 2015, chemotherapy was dose reduced and he received Neulasta with day 15 cycle 1  13. Nausea and vomiting following cycle 1 of FOLFIRI 14. Rectal stricture-manual/colonoscopic dilatation by Dr. Ardis Hughs 03/01/2016  APR 08/17/2016-no evidence of malignancy 15. History ofLeukopenia/Thrombocytopenia-persistent, potentially a sequelae of chemotherapy or hepatic toxicity from chemotherapy/radiation. Bone marrow biopsy 11/20/2014 showed cellular bone marrow with trilineage hematopoiesis. Significant dyspoiesis was not present and there was no evidence of metastatic carcinoma. Cytogenetic analysis showed the presence of normal male chromosomes with no observable clonal chromosomal abnormalities.  probable cirrhosis 16. Genetic testing-negative genetic panel in March 2014 17. 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 Severe over the face-steroid Dosepak prescribed 07/03/2015. Improved 07/10/2015. Further improved 07/24/2015, 08/07/2015, 08/28/2015 18. Diarrhea secondary to chemotherapy-encouraged to use Imodium  19. History of Paronychia secondary to PANITUMUMAB 20. 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 History of  neutropenia, thrombocytopenia 06/23/2017. Possibly related to radiation. He also has a history of suspected portal hypertension secondaryto toxicity from chemotherapy 22. Seizure in the setting of brain metastases 08/05/2017, now maintained onLamictal 23. Altered mental status evaluated by neuro-oncology, Keppra discontinued, improved 24. 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 Lower body edema, multifactorial. Trial of Lasix initiated 05/29/2018  Mr. Vogelsang is  a 48 year old male with metastatic rectal cancer.  He has been off treatment secondary to elevated liver enzymes.  He has a significantly elevated total bilirubin due to a biliary obstruction.  He is dehydrated.  He has had a continued decline in his functional status.  He has thrush on his tongue.  -Fluconazole for oral thrush. Change from IV to PO. Patient to complete 5 days total.  -I have called Radiation Oncology and have our recommendation of canceling SRS to his brain lesion scheduled for later today.  Radiation oncology PA will speak with Dr. Lisbeth Renshaw about this and let me know if they plan to cancel this. -Recommend home with hospice.   LOS: 2 days   Mikey Bussing, DNP, AGPCNP-BC, AOCNP 07/27/18  Mr. Drudge appears unchanged.  The gastroenterology service does not recommend an intervention procedure for an attempt at lowering the bilirubin.  Mr. Mcnerney understands this.  We discussed home hospice care.  He is in agreement.  I discussed options with his wife by telephone yesterday.  She agrees with home hospice. We communicated with radiation oncology.  The planned SRS to the brain will be canceled.  We will arrange for outpatient follow-up at the Cancer center.

## 2018-07-27 NOTE — Consult Note (Signed)
Consultation Note Date: 07/27/2018   Patient Name: Jacob [name]  DOB: 1970/04/29  MRN: [id number]  Age / Sex: 48 y.o., male  PCP: Mosie Lukes, MD Referring Physician: Kerney Elbe, DO  Reason for Consultation: Establishing goals of care  HPI/Patient Profile: 48 y.o. male  with past medical history of metastatic rectal cancer, elevated bili and Cr s/p recent biliary stent placement admitted on 07/25/2018 with nausea, vomiting, poor oral intake, and lethargy with continued concern for biliary obstruction.  Evaluated by GI and not a good candidate for further procedures.  Recommendation for hospice and patient agreeable.  Noted that his wife had concerns regarding plan for hospice and palliative consulted for further [place].   Clinical Assessment and Goals of Care: I met today with Jacob [name].   He reports that his physicians have been doing a good job explaining things him, and he is able to recall most details of this conversation with oncology as well as gastroenterology.  Occasionally seems to be have very mild confusion, but is able to recall conversation with minimal redirection.  We discussed clinical course as well as wishes moving forward to his care plan.  Concepts specific to code status and rehospitalization discussed.  We discussed difference between a aggressive medical intervention path and a palliative, comfort focused care path.    Concept of Hospice and Palliative Care were discussed.  He reports discussing this with Dr. Benay Spice and that he would like to proceed with home hospice "once they determine if there are any other things I need to have done before I go."  Questions and concerns addressed.   PMT will continue to support holistically.  SUMMARY OF RECOMMENDATIONS   - Likely home with hospice.  Patient today is agreeable to this. - Attempted to call his wife. Noted by hospice  liaison to have questions regarding plan, but it appears from chart review that she was able to discuss with GI after time she spoke with hospice.  Left VM.  Will reach out again tomorrow.  Code Status/Advance Care Planning:  DNR    Prognosis:   Poor.  He certainly qualifies for hospice with an expected prognosis of < 6 months.  I believe that his prognosis may be < 2 weeks.  Discharge Planning: Home with Hospice      Primary Diagnoses: Present on Admission: . Biliary obstruction due to cancer ([place]) . Malignant neoplasm of rectum ([place]) . Thrombocytopenia ([place])   I have reviewed the medical record, interviewed the patient and family, and examined the patient. The following aspects are pertinent.  Past Medical History:  Diagnosis Date  . Allergic state 01/19/2012  . Anemia   . Cancer ([place]) 03/06/12   rectal bx=Adenocarcinoma  PT HAD RADIATION , CHEMO SURGERY  . Cancer of brain [facility])    radiation  . Cancer of lung ([place])   . Chicken pox as a child   ?  Marland Kitchen Dysrhythmia as child   HX OF IRREGULAR HB AT TIME OF TONSILLECTOMY - SURGERY WAS  NOT DONE-CAN'T REMEMBER ANY OTHER DETAILS- NEVER HAD THE SURGERY.  . Elevated liver function tests 01/19/2012  . Fatty liver   . Hernia 6 months old  . History of diarrhea    better since chemo finished  . Hx of migraines   . Lung abnormality 2015  . Lung nodule 08/2013  . Lung nodule, multiple 11/29/2013  . Memory loss   . Migraine headache as a child  . Numbness    TOES OF BOTH FEET.  Marland Kitchen Overweight(278.02) 01/19/2012  . Pain    LEFT HEEL PAIN -SEVERE ESPECIALLY AFTER SITTING OR LYING DOWN - DIFFICULT TO GET OUT OF BED AND WALK IN THE MORNINGS BECAUSE OF HEEL PAIN  . 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 Preventative health care 01/19/2012  . Radiation 03/20/12-04/27/12   Pelvis 50.4 gray Rectal cancer  . Rectal cancer ([place]) 03/06/12   biopsy-adenocarcinoma  . Reflux 01/19/2012  .  Seizure ([place])    last one 2019  . Sun-damaged skin 01/19/2012   Social History   Socioeconomic History  . Marital status: Married    Spouse name: Not on file  . Number of children: 1  . Years of education: Not on file  . Highest education level: Not on file  Occupational History  . Occupation: Midwife: [employer]    Comment: [employer]  Social Needs  . Financial resource strain: Not on file  . Food insecurity:    Worry: Not on file    Inability: Not on file  . Transportation needs:    Medical: No    Non-medical: No  Tobacco Use  . Smoking status: Never Smoker  . Smokeless tobacco: Never Used  Substance and Sexual Activity  . Alcohol use: Not Currently    Alcohol/week: 0.0 standard drinks    Comment: RARE   . Drug use: No    Comment: marijuana past  . Sexual activity: Yes    Partners: Female  Lifestyle  . Physical activity:    Days per week: Not on file    Minutes per session: Not on file  . Stress: Not on file  Relationships  . Social connections:    Talks on phone: Not on file    Gets together: Not on file    Attends religious service: Not on file    Active member of club or organization: Not on file    Attends meetings of clubs or organizations: Not on file    Relationship status: Not on file  Other Topics Concern  . Not on file  Social History Narrative   Married to wife, [name]   Has one 59 year old child   Occupation: Emergency planning/management officer at [employer] History  Adopted: Yes  Problem Relation Age of Onset  . Cancer Father   . Heart disease Father   . Colon cancer Neg Hx    Scheduled Meds: . dexamethasone  1 mg Oral Daily  . feeding supplement (ENSURE ENLIVE)  237 mL Oral BID BM  . lamoTRIgine  100 mg Oral BID  . multivitamin with minerals  1 tablet Oral Daily  . pantoprazole  40 mg Oral Daily  . senna-docusate  1 tablet Oral BID   Continuous Infusions: . ciprofloxacin 400 mg (07/27/18 0801)  . fluconazole (DIFLUCAN) IV Stopped  (07/26/18 1133)  . lactated ringers 100 mL/hr at 07/27/18 0300   PRN Meds:.albuterol, clotrimazole, dronabinol, HYDROmorphone (DILAUDID) injection, ondansetron **OR**  ondansetron (ZOFRAN) IV, oxyCODONE Medications Prior to Admission:  Prior to Admission medications   Medication Sig Start Date End Date Taking? Authorizing Provider  albuterol (VENTOLIN HFA) 108 (90 Base) MCG/ACT inhaler Inhale 2 puffs into the lungs every 6 (six) hours as needed for wheezing or shortness of breath. 07/10/18  Yes Mosie Lukes, MD  clotrimazole (MYCELEX) 10 MG troche Take 1 lozenge (10 mg total) by mouth 4 (four) times daily. While on decadron Patient taking differently: Take 10 mg by mouth 4 (four) times daily as needed (oral thrush).  04/17/18  Yes Ladell Pier, MD  dexamethasone (DECADRON) 1 MG tablet Take 2 tablets (2 mg total) by mouth daily. Patient taking differently: Take 1 mg by mouth daily.  05/08/18  Yes Vaslow, Acey Lav, MD  docusate sodium (STOOL SOFTENER) 100 MG capsule Take 100-200 mg by mouth daily as needed for mild constipation.    Yes [provider]  dronabinol (MARINOL) 5 MG capsule Take 1 capsule by mouth three times daily as needed Patient taking differently: Take 5 mg by mouth 3 (three) times daily as needed (nausea).  07/20/18  Yes Mosie Lukes, MD  fluticasone (FLOVENT HFA) 110 MCG/ACT inhaler Inhale 2 puffs into the lungs 2 (two) times daily. 03/27/18  Yes Mosie Lukes, MD  furosemide (LASIX) 20 MG tablet Take 1 tablet (20 mg total) by mouth daily. 06/30/18  Yes Ladell Pier, MD  Guaifenesin 1200 MG TB12 Take 1,200 mg by mouth 2 (two) times a day.   Yes [provider]  lamoTRIgine (LAMICTAL) 100 MG tablet Take 1 tablet (100 mg total) by mouth 2 (two) times daily. 06/30/18  Yes Ladell Pier, MD  lidocaine-prilocaine (EMLA) cream Apply 1 application topically as needed (prior to port access). 02/13/18  Yes Owens Shark, NP  omeprazole (PRILOSEC) 40 MG  capsule Take 1 capsule (40 mg total) by mouth 2 (two) times a day for 30 days. 07/19/18 08/18/18 Yes Mansouraty, Telford Nab., MD  prochlorperazine (COMPAZINE) 10 MG tablet Take 1 tablet (10 mg total) by mouth every 6 (six) hours as needed for nausea or vomiting. 06/30/18  Yes Ladell Pier, MD  sennosides-docusate sodium (SENOKOT-S) 8.6-50 MG tablet Take 1 tablet by mouth 2 (two) times daily as needed for constipation.    Yes [provider]  ondansetron (ZOFRAN) 4 MG tablet Take 1 tablet (4 mg total) by mouth every 8 (eight) hours as needed for nausea or vomiting. Patient not taking: Reported on 07/25/2018 07/10/18   Mosie Lukes, MD  tiZANidine (ZANAFLEX) 4 MG tablet TAKE 1/2 TO 1 (ONE-HALF TO ONE) TABLET BY MOUTH EVERY 8 HOURS AS NEEDED FOR MUSCLE SPASM Patient not taking: No sig reported 05/04/18   Mosie Lukes, MD  trifluridine-tipiracil (LONSURF) 15-6.14 MG tablet Take 3 tablets (60m trifluridine) by mouth 2 times daily, within 1 hr of food on days 1-5 and 8-12 of each 28 day cycle Patient not taking: Reported on 07/25/2018 07/03/18   SLadell Pier MD   Allergies  Allergen Reactions  . Alprazolam Other (See Comments)    Excessive sedation  . Penicillins Rash    Childhood allergy Has patient had a PCN reaction causing immediate rash, facial/tongue/throat swelling, SOB or lightheadedness with hypotension: Unknown Has patient had a PCN reaction causing severe rash involving mucus membranes or skin necrosis: Unknown Has patient had a PCN reaction that required hospitalization: No Has patient had a PCN reaction occurring within the last 10 years:  No If all of the above answers are "NO", then may proceed with Cephalosporin use.    Review of Systems Reports being very tired.  Denies other complaints.  Physical Exam  General: Sleepy but able to participate in conversation. Heart: Regular rate and rhythm. No murmur appreciated. Lungs: Good air movement, clear Abdomen: Soft, +  colostomy  Ext: LE edema Skin: Warm and dry  Vital Signs: BP 114/77 (BP Location: Left Arm)   Pulse 77   Temp 97.7 F (36.5 C) (Oral)   Resp 18   Ht '5\' 10"'  (1.778 m)   Wt 73.2 kg   SpO2 97%   BMI 23.16 kg/m  Pain Scale: 0-10   Pain Score: 0-No pain   SpO2: SpO2: 97 % O2 Device:SpO2: 97 % O2 Flow Rate: .   IO: Intake/output summary:   Intake/Output Summary (Last 24 hours) at 07/27/2018 6619 Last data filed at 07/27/2018 6940 Gross per 24 hour  Intake 1736.15 ml  Output 700 ml  Net 1036.15 ml    LBM: Last BM Date: 07/26/18 Baseline Weight: Weight: 74.7 kg Most recent weight: Weight: 73.2 kg     Palliative Assessment/Data:   Flowsheet Rows     Most Recent Value  Intake Tab  Referral Department  -- [GI]  Unit at Time of Referral  Med/Surg Unit  Palliative Care Primary Diagnosis  Cancer  Date Notified  07/26/18  Palliative Care Type  New Palliative care  Reason for referral  Clarify Goals of Care  Date of Admission  07/25/18  # of days IP prior to Palliative referral  1  Clinical Assessment  Psychosocial & Spiritual Assessment  Palliative Care Outcomes      Time In: 1720 Time Out: 1840 Time Total: 80 Greater than 50%  of this time was spent counseling and coordinating care related to the above assessment and plan.  Signed by: Micheline Rough, MD   Please contact Palliative Medicine Team phone at [phone] for questions and concerns.  For individual provider: See Shea Evans

## 2018-07-27 NOTE — Progress Notes (Signed)
CRITICAL VALUE ALERT  Critical Value: Bili total 40.2  Date & Time Notied:  07/27/18 0750  Provider Notified:Dr Benay Spice Orders Received/Actions taken:no new orders

## 2018-07-27 NOTE — TOC Transition Note (Signed)
Transition of Care [facility]) - CM/SW Discharge Note   Patient Details  Name: Jacob [name] MRN: [id number] Date of Birth: [birth year]  [facility] CM/SW Contact:  Jacob Cha, RN Phone Number: 07/27/2018, 10:39 AM   Clinical Narrative:    Patient dc to home with home jhospice Jacob [name] weith [employer] notified, Md has spoken to her and the wife.   Final next level of care: Home w Hospice Care Barriers to Discharge: No Barriers Identified   Patient Goals and CMS Choice Patient states their goals for this hospitalization and ongoing recovery are:: unable to state   Choice offered to / list presented to : Patient  Discharge Placement                       Discharge Plan and Services   Discharge Planning Services: CM Consult                      HH Arranged: RN [facility] Agency: [facility] Date [place]: 07/27/18 Time [place]: 1027 Representative spoke with at [place]: Jacob [name]  Social Determinants of Health ([place]) Interventions     Readmission Risk Interventions No flowsheet data found.

## 2018-07-27 NOTE — Progress Notes (Signed)
Daily Progress Note   Patient Name: Jacob [name]       Date: 07/27/2018 DOB: 29-Sep-1970  Age: 48 y.o. MRN#: [id number] Attending Physician: Kerney Elbe, DO Primary Care Physician: Mosie Lukes, MD Admit Date: 07/25/2018  Reason for Consultation/Follow-up: Disposition and Establishing goals of care  Subjective: I saw and examined Jacob [name] today.  Discussed plan for home with hospice.  I was able to reach Jacob [name] and [name] via phone.  They report being able to speak with GI yesterday and understanding reasoning behind forgoing further invasive procedures.  At this point, family wants to work to get him home with hospice support as soon as possible.  Length of Stay: 2  Current Medications: Scheduled Meds:  . dexamethasone  1 mg Oral Daily  . feeding supplement (ENSURE ENLIVE)  237 mL Oral BID BM  . lamoTRIgine  100 mg Oral BID  . multivitamin with minerals  1 tablet Oral Daily  . pantoprazole  40 mg Oral Daily  . senna-docusate  1 tablet Oral BID    Continuous Infusions: . ciprofloxacin 400 mg (07/27/18 0801)  . fluconazole (DIFLUCAN) IV Stopped (07/26/18 1133)  . lactated ringers 100 mL/hr at 07/27/18 0300    PRN Meds: albuterol, clotrimazole, dronabinol, haloperidol, HYDROmorphone (DILAUDID) injection, LORazepam, ondansetron **OR** ondansetron (ZOFRAN) IV, oxyCODONE  Physical Exam         General: Sleepy but able to participate in conversation. Heart: Regular rate and rhythm. No murmur appreciated. Lungs: Good air movement, clear Abdomen: Soft, + colostomy  Ext: LE edema Skin: Warm and dry  Vital Signs: BP 114/77 (BP Location: Left Arm)   Pulse 77   Temp 97.7 F (36.5 C) (Oral)   Resp 18   Ht 5\' 10"  (1.778 m)   Wt 73.2 kg   SpO2 97%   BMI 23.16 kg/m   SpO2: SpO2: 97 % O2 Device: O2 Device: Room Air O2 Flow Rate:    Intake/output summary:   Intake/Output Summary (Last 24 hours) at 07/27/2018 0940 Last data filed at 07/27/2018 0504 Gross per 24 hour  Intake 1736.15 ml  Output 700 ml  Net 1036.15 ml   LBM: Last BM Date: 07/26/18 Baseline Weight: Weight: 74.7 kg Most recent weight: Weight: 73.2 kg       Palliative Assessment/Data:  Flowsheet Rows     Most Recent Value  Intake Tab  Referral Department  -- [GI]  Unit at Time of Referral  Med/Surg Unit  Palliative Care Primary Diagnosis  Cancer  Date Notified  07/26/18  Palliative Care Type  New Palliative care  Reason for referral  Clarify Goals of Care  Date of Admission  07/25/18  # of days IP prior to Palliative referral  1  Clinical Assessment  Psychosocial & Spiritual Assessment  Palliative Care Outcomes      Patient Active Problem List   Diagnosis Date Noted  . Biliary obstruction due to cancer ([place]) 07/25/2018  . AKI (acute kidney injury) ([place]) 07/25/2018  . Jaundice 07/10/2018  . Nausea and vomiting 07/10/2018  . 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 Decreased visual acuity 03/29/2018  . Hemianopia, homonymous, right 03/13/2018  . Port-A-Cath in place 02/22/2018  . Vocal cord paralysis 02/10/2018  . Constipation 12/19/2017  . Anorexia 11/13/2017  . Overactive bladder 11/07/2017  . Insomnia 11/07/2017  . Hyponatremia 10/05/2017  . Cough 10/05/2017  . Focal seizures ([place]) 09/27/2017  . Brain metastasis ([place]) 08/17/2017  . 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 Port catheter in place 08/28/2015  . Leukopenia   . Thrombocytopenia ([place])   . Lung nodule, multiple 11/29/2013  . 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[place]) 09/17/2013  . 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 Anemia 12/31/2012  . Grief reaction  06/18/2012  . Malignant neoplasm of rectum ([place]) 03/17/2012  . Elevated liver function tests 01/19/2012  . Elevated BP 01/19/2012  . Sun-damaged skin 01/19/2012    Palliative Care Assessment & Plan   Patient Profile: 48 yo with metastatic rectal cancer with worsening hyperbilirubinemia  Recommendations/Plan:  Discussed with patient and family.  Plan for home with hospice. On discharge, would recommend addition of these meds for comfort:  - Oxycodone Concentrate 10mg /0.51ml: 5mg  (0.52ml) sublingual every 1 hour as needed for pain or shortness of breath: Disp 61ml  - Lorazepam 2mg /ml concentrated solution: 0.5mg  (0.71ml) sublingual every 4 hours as needed for anxiety: Disp 16ml - Haldol 2mg /ml solution: 0.5mg  (0.71ml) sublingual every 4 hours as needed for agitation or nausea: Disp 21ml     Goals of Care and Additional Recommendations:  Limitations on Scope of Treatment: Avoid Hospitalization and Full Comfort Care  Code Status:    Code Status Orders  (From admission, onward)         Start     Ordered   07/25/18 2218  Do not attempt resuscitation (DNR)  Continuous    Question Answer Comment  In the event of cardiac or respiratory ARREST Do not call a "code blue"   In the event of cardiac or respiratory ARREST Do not perform Intubation, CPR, defibrillation or ACLS   In the event of cardiac or respiratory ARREST Use medication by any route, position, wound care, and other measures to relive pain and suffering. May use oxygen, suction and manual treatment of airway obstruction as needed for comfort.      07/25/18 2219        Code Status History    Date Active Date Inactive Code Status Order ID Comments User Context   02/10/2018 1240 02/11/2018 1428 Full Code [id number]  Melissa Montane, MD Inpatient   08/17/2016 1224 08/21/2016 1344 Full Code [id number]  Leighton Ruff, MD Inpatient   09/17/2013 1416  09/24/2013 1539 Full Code [id number]  Stark Klein, MD Inpatient       Prognosis:    < 2 weeks  Discharge Planning:  Home with Hospice  Care plan was discussed with patient, [name], mother, RN, hospice liaison  Thank you for allowing the Palliative Medicine Team to assist in the care of this patient.   Time In: 0900 Time Out: 0940 Total Time 40 Prolonged Time Billed No      Greater than 50%  of this time was spent counseling and coordinating care related to the above assessment and plan.  Micheline Rough, MD  Please contact Palliative Medicine Team phone at [phone] for questions and concerns.

## 2018-07-28 ENCOUNTER — Telehealth: Payer: Self-pay | Admitting: *Deleted

## 2018-07-28 NOTE — Telephone Encounter (Signed)
Spoke with wife [name].  Instructed [name] to obtain FMLA papers from pt's human resources, fill out his info, and either fax or bring forms to clinic for completion and for Dr. Gearldine Shown signature.  Instructed Berta to obtain forms soon.  [name] voiced understanding.

## 2018-07-30 LAB — CULTURE, BLOOD (ROUTINE X 2)
Culture: NO GROWTH
Culture: NO GROWTH
Special Requests: ADEQUATE

## 2018-07-31 ENCOUNTER — Encounter: Payer: Self-pay | Admitting: Oncology

## 2018-07-31 ENCOUNTER — Other Ambulatory Visit: Payer: Self-pay | Admitting: Oncology

## 2018-08-01 ENCOUNTER — Encounter: Payer: Self-pay | Admitting: Oncology

## 2018-08-01 ENCOUNTER — Telehealth: Payer: Self-pay | Admitting: *Deleted

## 2018-08-01 NOTE — Telephone Encounter (Signed)
Wife called to follow up on 2nd letter MD dictated for him. Informed her it was faxed to the [phone] that she had requested. She is asking to pick up the letter today as well. Letter at front desk for her.

## 2018-08-08 ENCOUNTER — Telehealth: Payer: Self-pay | Admitting: *Deleted

## 2018-08-08 ENCOUNTER — Encounter: Payer: Self-pay | Admitting: Oncology

## 2018-08-08 NOTE — Telephone Encounter (Signed)
Wife called to request death certificate signature as quickly as possible due to cremation pending. Called back and left VM that we have not received the certificate yet from funeral home. Suggested she follow up with them about when they will be bringing it.

## 2018-08-21 DEATH — deceased

## 2019-05-29 ENCOUNTER — Telehealth: Payer: Self-pay | Admitting: *Deleted

## 2019-05-29 NOTE — Telephone Encounter (Signed)
On 05/29/19 faxed medical records to [facility]

## 2020-06-01 IMAGING — CT CT ABDOMEN AND PELVIS WITH CONTRAST
2 of 5 series · 14 of 46 positions shown, 16 images · IV contrast (OMNIPAQUE)
Comparison: 04/19/2018

CLINICAL DATA: Metastatic rectal cancer diagnosed in 8738.
Chemotherapy and radiation therapy. Brain metastasis. Partial liver
resection. Colostomy. Evaluate for biliary obstruction.

EXAM:
CT ABDOMEN AND PELVIS WITH CONTRAST
TECHNIQUE: Multidetector CT imaging of the abdomen and pelvis was performed
using the standard protocol following bolus administration of
intravenous contrast.
CONTRAST:  100mL OMNIPAQUE IOHEXOL 300 MG/ML  SOLN

[Series 2: axial st · axial · 0.84mm/px · z∈[-564,-114]mm · 11 of 104 slices shown, 13 images]
[im 7/104  soft-tissue]
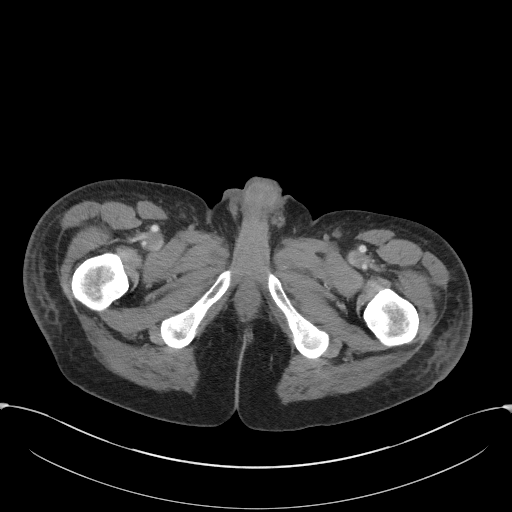
[im 7/104  bone]
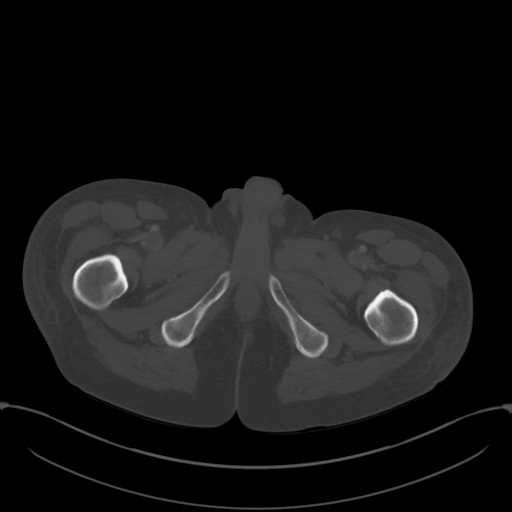
[im 14/104  soft-tissue]
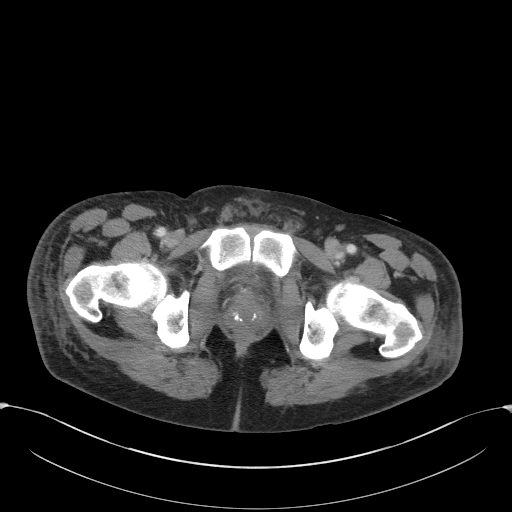
[im 28/104  soft-tissue]
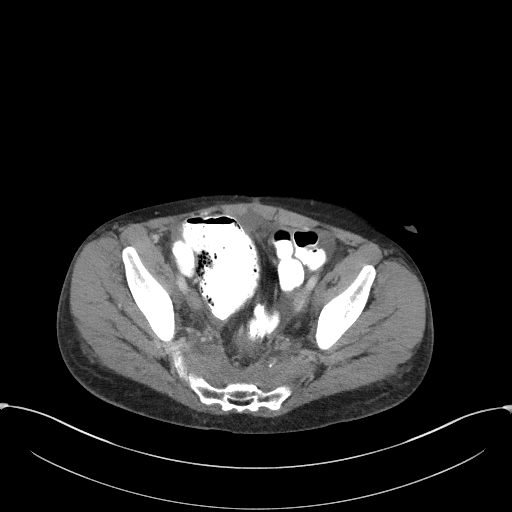
[im 35/104  soft-tissue]
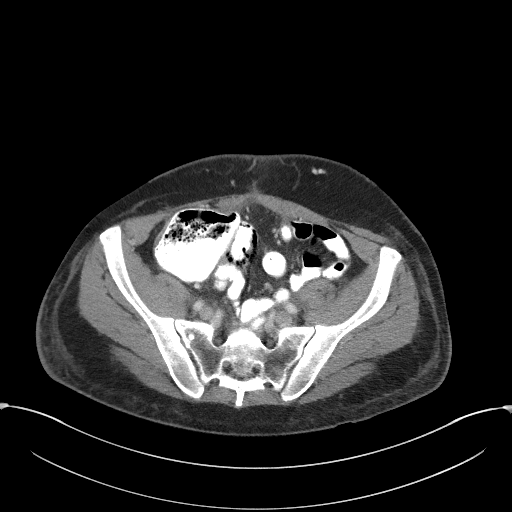
[im 42/104  soft-tissue]
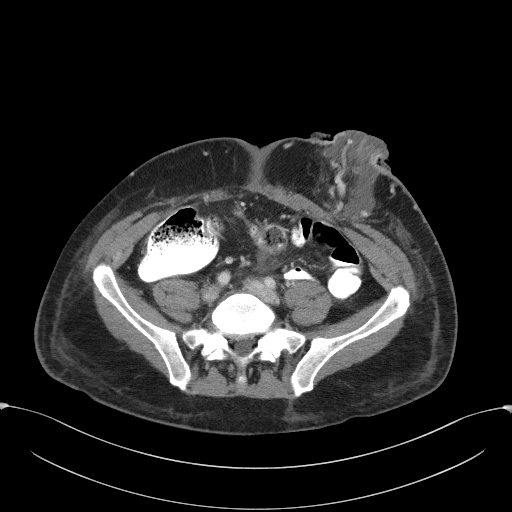
[im 55/104  soft-tissue]
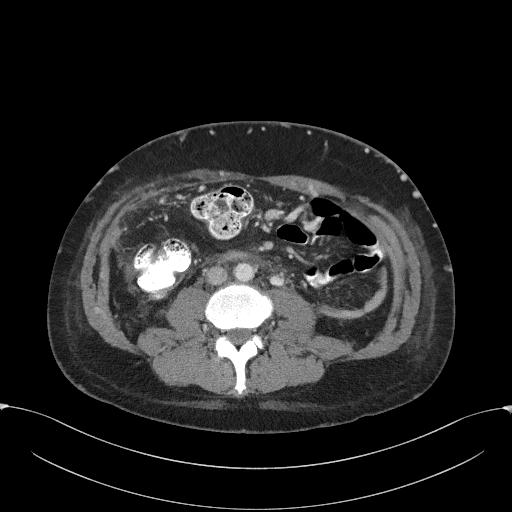
[im 62/104  soft-tissue]
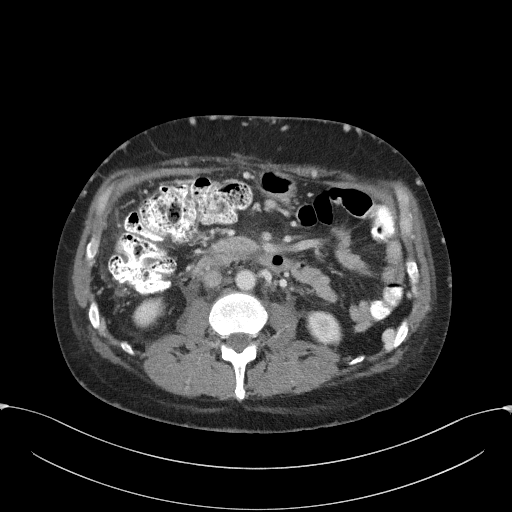
[im 69/104  soft-tissue]
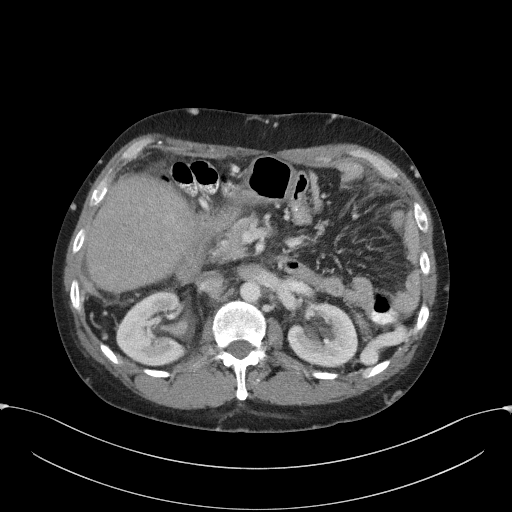
[im 76/104  soft-tissue]
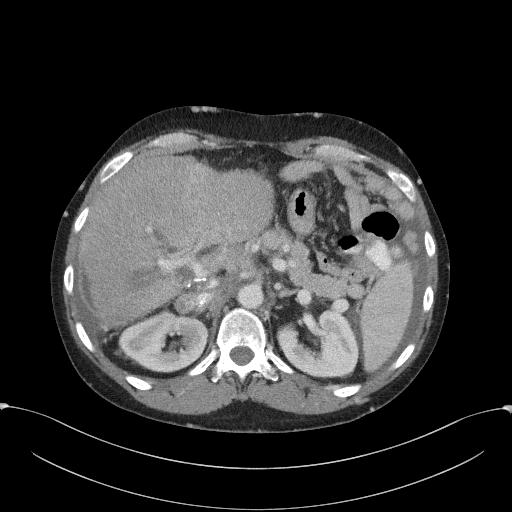
[im 76/104  bone]
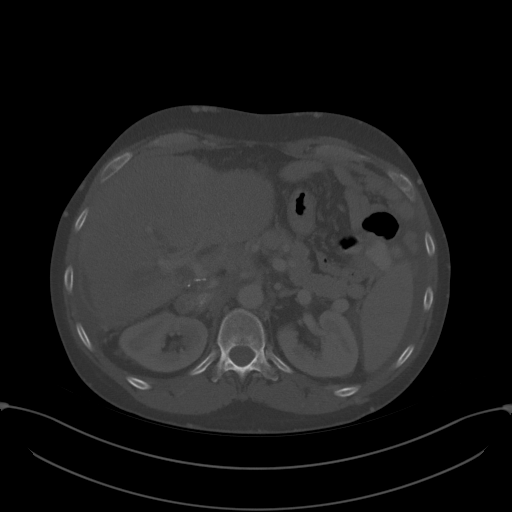
[im 90/104  soft-tissue]
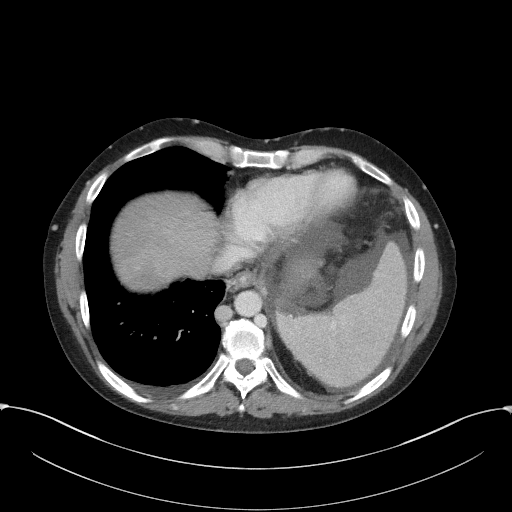
[im 97/104  soft-tissue]
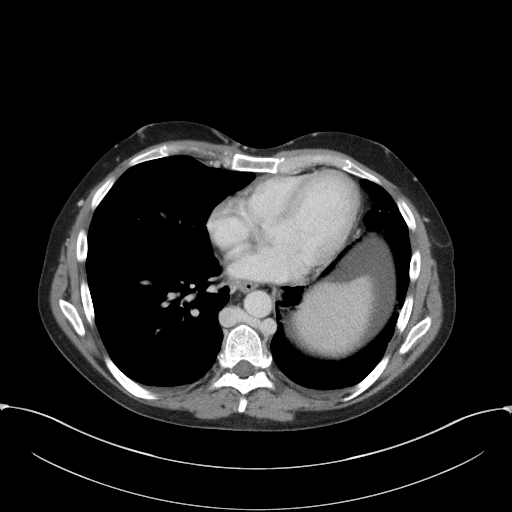

[Series 4: coronal st · coronal · 0.89mm/px · 3 of 85 slices shown]
[im 29/85  soft-tissue]
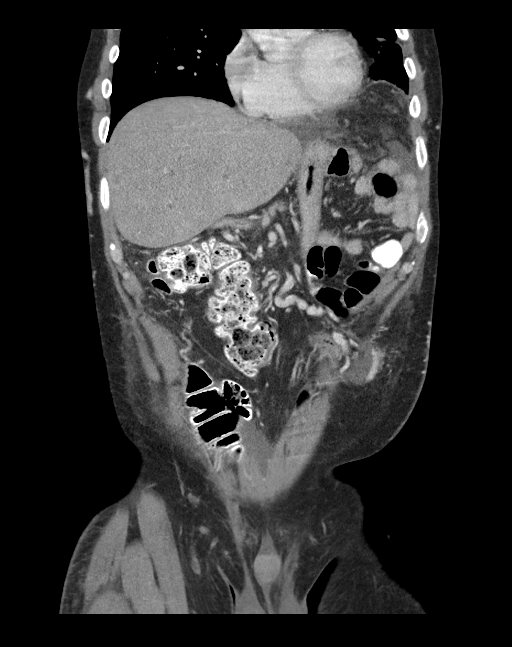
[im 38/85  soft-tissue]
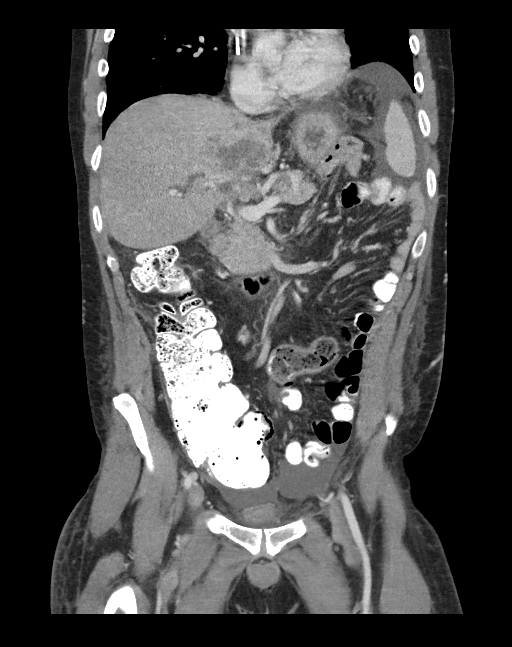
[im 47/85  soft-tissue]
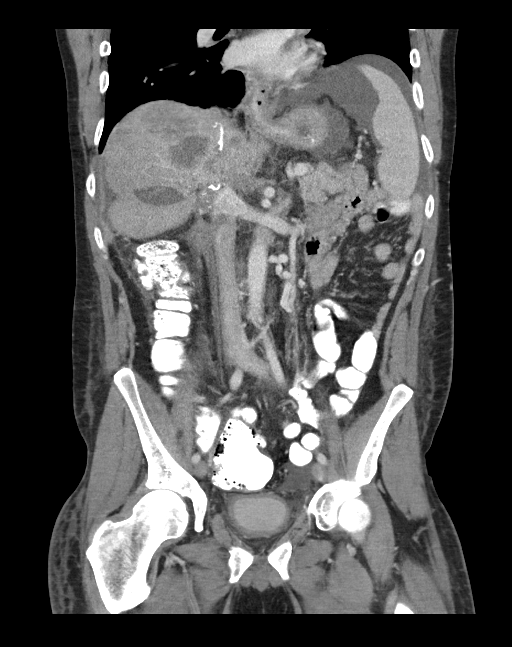

[14 of 46 positions shown; findings below may reference images not displayed]

FINDINGS: Lower chest: Tiny bilateral pleural effusions. The left effusion is
decreased since the prior CT. The right effusion is new.

Bilateral pulmonary metastasis. In index right lower lobe 6 mm
nodule on image [DATE] is similar to on the prior exam (when
remeasured).

An index medial right lower lobe 9 mm nodule on image 44/6 is
increased from 6 mm on the prior exam (when remeasured).

Improved left base aeration, with resolution of previous basilar
collapse.

Hepatobiliary: Tumor centered about the posterior right liver and
involving the intrahepatic IVC again identified. Dominant
medial/caudate lobe mass measures 6.5 x 4.4 cm on image [DATE].
Compare 5.5 x 3.9 cm on the prior.

An adjacent more peripheral/lateral lesion measures 3.3 x 2.8 cm on
image [DATE] versus 2.2 x 2.0 cm on the prior.

A right hepatic subcapsular lesion is new at 2.4 x 2.2 cm on image
33/2.

Cholecystectomy. Development of mild intrahepatic biliary duct
dilatation, including image [DATE]. This is followed to the level of
the central tumor described above. Example area of biliary
obstruction on image [DATE]. No common duct dilatation.

Pancreas: Normal, without mass or ductal dilatation.

Spleen: Normal in size, without focal abnormality.

Adrenals/Urinary Tract: Normal left adrenal gland (the previously
described left adrenal lesion is actually attributed to
retroperitoneal adenopathy.)

Right adrenal metastasis measures 2.6 cm on image [DATE] versus 2.5 cm
on the prior exam (when remeasured).

Interpolar left renal 1.5 cm lesion on image [DATE] has enlarged from
9 mm on the prior.

Normal right kidney, without hydronephrosis. Contrast within the
urinary bladder.

Stomach/Bowel: Normal stomach, without wall thickening. Status post
abdominal perineal resection with descending colostomy. Normal
terminal ileum. Normal small bowel caliber.

Vascular/Lymphatic: Aortic and branch vessel atherosclerosis. IVC
involvement by tumor with secondary enlargement of the azygos and
hemi azygous veins on image [DATE]. This is chronic. The portal vein
and branches are increasingly narrowed by tumor without acute
thrombus.

Left periaortic adenopathy at 1.9 cm on image 33/2, similar.

A preaortic node measures 1.6 cm on image 38/2 versus 1.3 cm on the
prior.

No pelvic sidewall adenopathy.

Reproductive: Normal prostate.

Other: Presacral soft tissue thickening is likely treatment related.
Small volume abdominal ascites is new.

Musculoskeletal: Bilateral femoral head avascular necrosis.
Degenerate disc disease at the lumbosacral junction. Left
hemidiaphragm elevation.
IMPRESSION: 1. Mild to moderate disease progression compared to 04/19/2018.
2. New and enlarged hepatic metastasis, with involvement of the
intrahepatic IVC and increased narrowing of the portal
vein/branches.
3. Developing mild intrahepatic biliary duct dilatation, likely
secondary to mass effect from central tumor.
4. Progressive abdominal nodal metastasis. Similar right adrenal
metastasis.
5. Slight progression of lung base metastasis. Improved left-sided
aeration with decreased left and new right pleural effusion.
6. No bowel obstruction or other acute complication.
7. New small volume abdominal ascites.
8. Bilateral femoral head avascular necrosis.
9. Enlarging left renal lesion, suspicious for metastasis.
